# Patient Record
Sex: Female | Born: 1955 | Race: Black or African American | Hispanic: No | Marital: Single | Smoking: Former smoker
Health system: Southern US, Community
[De-identification: ages and names within clinical notes are randomized; demographics above are authoritative.]

## PROBLEM LIST (undated history)

## (undated) DIAGNOSIS — I890 Lymphedema, not elsewhere classified: Secondary | ICD-10-CM

## (undated) DIAGNOSIS — I10 Essential (primary) hypertension: Secondary | ICD-10-CM

## (undated) DIAGNOSIS — I2699 Other pulmonary embolism without acute cor pulmonale: Secondary | ICD-10-CM

## (undated) DIAGNOSIS — R269 Unspecified abnormalities of gait and mobility: Secondary | ICD-10-CM

## (undated) DIAGNOSIS — D649 Anemia, unspecified: Secondary | ICD-10-CM

## (undated) DIAGNOSIS — E119 Type 2 diabetes mellitus without complications: Secondary | ICD-10-CM

## (undated) DIAGNOSIS — G35 Multiple sclerosis: Secondary | ICD-10-CM

## (undated) HISTORY — DX: Unspecified abnormalities of gait and mobility: R26.9

---

## 1999-07-17 ENCOUNTER — Emergency Department (HOSPITAL_COMMUNITY): Admission: EM | Admit: 1999-07-17 | Discharge: 1999-07-18 | Payer: Self-pay | Admitting: Emergency Medicine

## 1999-08-15 ENCOUNTER — Ambulatory Visit (HOSPITAL_COMMUNITY): Admission: RE | Admit: 1999-08-15 | Discharge: 1999-08-15 | Payer: Self-pay | Admitting: Psychiatry

## 2001-04-12 ENCOUNTER — Other Ambulatory Visit: Admission: RE | Admit: 2001-04-12 | Discharge: 2001-04-12 | Payer: Self-pay | Admitting: Obstetrics and Gynecology

## 2003-05-29 ENCOUNTER — Encounter: Admission: RE | Admit: 2003-05-29 | Discharge: 2003-05-29 | Payer: Self-pay | Admitting: Obstetrics and Gynecology

## 2004-06-27 ENCOUNTER — Emergency Department (HOSPITAL_COMMUNITY): Admission: EM | Admit: 2004-06-27 | Discharge: 2004-06-27 | Payer: Self-pay | Admitting: Family Medicine

## 2005-08-09 ENCOUNTER — Ambulatory Visit (HOSPITAL_COMMUNITY): Admission: RE | Admit: 2005-08-09 | Discharge: 2005-08-09 | Payer: Self-pay | Admitting: Cardiovascular Disease

## 2007-03-25 ENCOUNTER — Ambulatory Visit (HOSPITAL_COMMUNITY): Admission: RE | Admit: 2007-03-25 | Discharge: 2007-03-25 | Payer: Self-pay | Admitting: Cardiovascular Disease

## 2007-11-04 ENCOUNTER — Emergency Department (HOSPITAL_COMMUNITY): Admission: EM | Admit: 2007-11-04 | Discharge: 2007-11-05 | Payer: Self-pay | Admitting: Emergency Medicine

## 2010-04-28 ENCOUNTER — Encounter: Admission: RE | Admit: 2010-04-28 | Discharge: 2010-06-08 | Payer: Self-pay | Source: Home / Self Care

## 2011-03-15 LAB — COMPREHENSIVE METABOLIC PANEL
ALT: 19
AST: 21
Albumin: 3.9
Alkaline Phosphatase: 60
BUN: 11
CO2: 28
Calcium: 9.6
Chloride: 100
Creatinine, Ser: 0.57
GFR calc Af Amer: >60
GFR calc non Af Amer: >60
Glucose, Bld: 186 — ABNORMAL HIGH
Potassium: 2.9 — ABNORMAL LOW
Sodium: 139
Total Bilirubin: 0.5
Total Protein: 7.4

## 2011-03-15 LAB — URINALYSIS, ROUTINE W REFLEX MICROSCOPIC
Bilirubin Urine: NEGATIVE
Glucose, UA: NEGATIVE
Hgb urine dipstick: NEGATIVE
Ketones, ur: NEGATIVE
Nitrite: NEGATIVE
Protein, ur: NEGATIVE
Specific Gravity, Urine: 1.019
Urobilinogen, UA: 0.2
pH: 7

## 2011-03-15 LAB — DIFFERENTIAL
Basophils Absolute: 0.1
Basophils Relative: 1
Eosinophils Absolute: 0.1
Eosinophils Relative: 2
Lymphocytes Relative: 43
Lymphs Abs: 3
Monocytes Absolute: 0.5
Monocytes Relative: 8
Neutro Abs: 3.3
Neutrophils Relative %: 47

## 2011-03-15 LAB — CBC
HCT: 34.5 — ABNORMAL LOW
Hemoglobin: 11.5 — ABNORMAL LOW
MCHC: 33.2
MCV: 86.5
Platelets: 430 — ABNORMAL HIGH
RBC: 3.99
RDW: 12.3
WBC: 7.1

## 2011-03-15 LAB — URINE MICROSCOPIC-ADD ON

## 2011-05-10 ENCOUNTER — Encounter: Payer: Self-pay | Admitting: *Deleted

## 2011-05-10 ENCOUNTER — Other Ambulatory Visit: Payer: Self-pay

## 2011-05-10 ENCOUNTER — Emergency Department (HOSPITAL_COMMUNITY): Payer: Medicare Other

## 2011-05-10 ENCOUNTER — Inpatient Hospital Stay (HOSPITAL_COMMUNITY)
Admission: EM | Admit: 2011-05-10 | Discharge: 2011-05-13 | DRG: 871 | Disposition: A | Discharge status: Home Health | Payer: Medicare Other | Source: Ambulatory Visit | Attending: Internal Medicine | Admitting: Internal Medicine

## 2011-05-10 DIAGNOSIS — G35 Multiple sclerosis: Secondary | ICD-10-CM | POA: Diagnosis present

## 2011-05-10 DIAGNOSIS — R6510 Systemic inflammatory response syndrome (SIRS) of non-infectious origin without acute organ dysfunction: Principal | ICD-10-CM | POA: Diagnosis present

## 2011-05-10 DIAGNOSIS — Z79899 Other long term (current) drug therapy: Secondary | ICD-10-CM

## 2011-05-10 DIAGNOSIS — R739 Hyperglycemia, unspecified: Secondary | ICD-10-CM

## 2011-05-10 DIAGNOSIS — E876 Hypokalemia: Secondary | ICD-10-CM | POA: Diagnosis present

## 2011-05-10 DIAGNOSIS — R000 Tachycardia, unspecified: Secondary | ICD-10-CM

## 2011-05-10 DIAGNOSIS — R252 Cramp and spasm: Secondary | ICD-10-CM

## 2011-05-10 DIAGNOSIS — I890 Lymphedema, not elsewhere classified: Secondary | ICD-10-CM | POA: Diagnosis present

## 2011-05-10 DIAGNOSIS — Z882 Allergy status to sulfonamides status: Secondary | ICD-10-CM

## 2011-05-10 DIAGNOSIS — E119 Type 2 diabetes mellitus without complications: Secondary | ICD-10-CM | POA: Diagnosis present

## 2011-05-10 DIAGNOSIS — R509 Fever, unspecified: Secondary | ICD-10-CM

## 2011-05-10 DIAGNOSIS — L8990 Pressure ulcer of unspecified site, unspecified stage: Secondary | ICD-10-CM | POA: Diagnosis present

## 2011-05-10 DIAGNOSIS — J189 Pneumonia, unspecified organism: Secondary | ICD-10-CM | POA: Diagnosis present

## 2011-05-10 DIAGNOSIS — L8992 Pressure ulcer of unspecified site, stage 2: Secondary | ICD-10-CM | POA: Diagnosis present

## 2011-05-10 DIAGNOSIS — L89109 Pressure ulcer of unspecified part of back, unspecified stage: Secondary | ICD-10-CM | POA: Diagnosis present

## 2011-05-10 DIAGNOSIS — Z87891 Personal history of nicotine dependence: Secondary | ICD-10-CM

## 2011-05-10 DIAGNOSIS — I10 Essential (primary) hypertension: Secondary | ICD-10-CM | POA: Diagnosis present

## 2011-05-10 HISTORY — DX: Type 2 diabetes mellitus without complications: E11.9

## 2011-05-10 HISTORY — DX: Lymphedema, not elsewhere classified: I89.0

## 2011-05-10 HISTORY — DX: Essential (primary) hypertension: I10

## 2011-05-10 HISTORY — DX: Multiple sclerosis: G35

## 2011-05-10 LAB — CBC
HCT: 30.9 % — ABNORMAL LOW (ref 36.0–46.0)
Hemoglobin: 10.4 g/dL — ABNORMAL LOW (ref 12.0–15.0)
MCH: 27.9 pg (ref 26.0–34.0)
MCHC: 33.7 g/dL (ref 30.0–36.0)
MCV: 82.8 fL (ref 78.0–100.0)
Platelets: 401 10*3/uL — ABNORMAL HIGH (ref 150–400)
RBC: 3.73 MIL/uL — ABNORMAL LOW (ref 3.87–5.11)
RDW: 12.7 % (ref 11.5–15.5)
WBC: 17.5 10*3/uL — ABNORMAL HIGH (ref 4.0–10.5)

## 2011-05-10 LAB — COMPREHENSIVE METABOLIC PANEL
ALT: 13 U/L (ref 0–35)
AST: 15 U/L (ref 0–37)
Albumin: 3.6 g/dL (ref 3.5–5.2)
Alkaline Phosphatase: 82 U/L (ref 39–117)
BUN: 12 mg/dL (ref 6–23)
CO2: 25 mEq/L (ref 19–32)
Calcium: 9.7 mg/dL (ref 8.4–10.5)
Chloride: 93 mEq/L — ABNORMAL LOW (ref 96–112)
Creatinine, Ser: 0.45 mg/dL — ABNORMAL LOW (ref 0.50–1.10)
GFR calc Af Amer: >90 mL/min (ref >90)
GFR calc non Af Amer: >90 mL/min (ref >90)
Glucose, Bld: 352 mg/dL — ABNORMAL HIGH (ref 70–99)
Potassium: 2.9 mEq/L — ABNORMAL LOW (ref 3.5–5.1)
Sodium: 132 mEq/L — ABNORMAL LOW (ref 135–145)
Total Bilirubin: 0.3 mg/dL (ref 0.3–1.2)
Total Protein: 7.9 g/dL (ref 6.0–8.3)

## 2011-05-10 LAB — DIFFERENTIAL
Basophils Absolute: 0 10*3/uL (ref 0.0–0.1)
Basophils Relative: 0 % (ref 0–1)
Eosinophils Absolute: 0 10*3/uL (ref 0.0–0.7)
Eosinophils Relative: 0 % (ref 0–5)
Lymphocytes Relative: 7 % — ABNORMAL LOW (ref 12–46)
Lymphs Abs: 1.3 10*3/uL (ref 0.7–4.0)
Monocytes Absolute: 1.4 10*3/uL — ABNORMAL HIGH (ref 0.1–1.0)
Monocytes Relative: 8 % (ref 3–12)
Neutro Abs: 14.7 10*3/uL — ABNORMAL HIGH (ref 1.7–7.7)
Neutrophils Relative %: 84 % — ABNORMAL HIGH (ref 43–77)

## 2011-05-10 MED ORDER — LORAZEPAM 1 MG PO TABS
1.0000 mg | ORAL_TABLET | Freq: Once | ORAL | Status: AC
  Administered 2011-05-10: 1 mg via ORAL
  Filled 2011-05-10: qty 1

## 2011-05-10 NOTE — ED Notes (Signed)
the patient states she has been having muscle spasms in her hands. the patient states both her hands will "go numb". the patient has good cap refill, strong bilateral radial pulses, confirms tactile pain.  the patient states she took her baclofen with relief. the patient also presents with bilateral leg swelling with +3 pitting edema. Pt states she is a type II diabetic.

## 2011-05-10 NOTE — ED Notes (Signed)
Per EMS: pt c/o of muscle spasms. Pt was given advil at 1900. Pt has a hx of MS. Pt thinks this is a MS flare up.

## 2011-05-10 NOTE — ED Notes (Signed)
[id number]<JY> Expected date:<BR> Expected time:<BR> Means of arrival:<BR> Comments:<BR> EMS/weak-hx MS

## 2011-05-11 ENCOUNTER — Encounter (HOSPITAL_COMMUNITY): Payer: Self-pay | Admitting: Family Medicine

## 2011-05-11 DIAGNOSIS — E119 Type 2 diabetes mellitus without complications: Secondary | ICD-10-CM | POA: Diagnosis present

## 2011-05-11 DIAGNOSIS — I890 Lymphedema, not elsewhere classified: Secondary | ICD-10-CM | POA: Diagnosis present

## 2011-05-11 DIAGNOSIS — I10 Essential (primary) hypertension: Secondary | ICD-10-CM | POA: Diagnosis present

## 2011-05-11 DIAGNOSIS — J189 Pneumonia, unspecified organism: Secondary | ICD-10-CM | POA: Diagnosis present

## 2011-05-11 DIAGNOSIS — G35 Multiple sclerosis: Secondary | ICD-10-CM | POA: Diagnosis present

## 2011-05-11 DIAGNOSIS — E876 Hypokalemia: Secondary | ICD-10-CM | POA: Diagnosis present

## 2011-05-11 DIAGNOSIS — R6510 Systemic inflammatory response syndrome (SIRS) of non-infectious origin without acute organ dysfunction: Secondary | ICD-10-CM | POA: Diagnosis present

## 2011-05-11 LAB — CBC
MCHC: 33.8 g/dL (ref 30.0–36.0)
MCV: 82.2 fL (ref 78.0–100.0)
RBC: 3.38 MIL/uL — ABNORMAL LOW (ref 3.87–5.11)
WBC: 19.5 10*3/uL — ABNORMAL HIGH (ref 4.0–10.5)

## 2011-05-11 LAB — BASIC METABOLIC PANEL
BUN: 10 mg/dL (ref 6–23)
Chloride: 97 mEq/L (ref 96–112)
Creatinine, Ser: 0.36 mg/dL — ABNORMAL LOW (ref 0.50–1.10)
GFR calc non Af Amer: >90 mL/min (ref >90)
Glucose, Bld: 324 mg/dL — ABNORMAL HIGH (ref 70–99)
Potassium: 3 mEq/L — ABNORMAL LOW (ref 3.5–5.1)

## 2011-05-11 LAB — URINE MICROSCOPIC-ADD ON

## 2011-05-11 LAB — URINALYSIS, ROUTINE W REFLEX MICROSCOPIC
Bilirubin Urine: NEGATIVE
Glucose, UA: >1000 mg/dL — AB
Hgb urine dipstick: NEGATIVE
Ketones, ur: 15 mg/dL — AB
Leukocytes, UA: NEGATIVE
Nitrite: NEGATIVE
Protein, ur: 100 mg/dL — AB
Specific Gravity, Urine: 1.039 — ABNORMAL HIGH (ref 1.005–1.030)
Urobilinogen, UA: 1 mg/dL (ref 0.0–1.0)
pH: 6.5 (ref 5.0–8.0)

## 2011-05-11 LAB — HEMOGLOBIN A1C: Mean Plasma Glucose: 249 mg/dL — ABNORMAL HIGH (ref <117)

## 2011-05-11 LAB — GLUCOSE, CAPILLARY
Glucose-Capillary: 221 mg/dL — ABNORMAL HIGH (ref 70–99)
Glucose-Capillary: 276 mg/dL — ABNORMAL HIGH (ref 70–99)

## 2011-05-11 LAB — MAGNESIUM: Magnesium: 1.6 mg/dL (ref 1.5–2.5)

## 2011-05-11 LAB — PREGNANCY, URINE: Preg Test, Ur: NEGATIVE

## 2011-05-11 MED ORDER — ENOXAPARIN SODIUM 40 MG/0.4ML ~~LOC~~ SOLN
40.0000 mg | SUBCUTANEOUS | Status: DC
  Administered 2011-05-11 – 2011-05-13 (×3): 40 mg via SUBCUTANEOUS
  Filled 2011-05-11 (×4): qty 0.4

## 2011-05-11 MED ORDER — IBUPROFEN 600 MG PO TABS
600.0000 mg | ORAL_TABLET | Freq: Four times a day (QID) | ORAL | Status: DC | PRN
  Administered 2011-05-11: 14:00:00 via ORAL
  Administered 2011-05-12 (×2): 600 mg via ORAL
  Filled 2011-05-11 (×4): qty 1

## 2011-05-11 MED ORDER — SODIUM CHLORIDE 0.9 % IJ SOLN: 3.0000 mL | INTRAMUSCULAR | Status: DC | PRN

## 2011-05-11 MED ORDER — INSULIN GLARGINE 100 UNIT/ML ~~LOC~~ SOLN
15.0000 [IU] | Freq: Every day | SUBCUTANEOUS | Status: DC
  Administered 2011-05-11: 15 [IU] via SUBCUTANEOUS
  Filled 2011-05-11: qty 3

## 2011-05-11 MED ORDER — BACLOFEN 10 MG PO TABS
10.0000 mg | ORAL_TABLET | Freq: Three times a day (TID) | ORAL | Status: DC
  Administered 2011-05-11 – 2011-05-13 (×7): 10 mg via ORAL
  Filled 2011-05-11 (×9): qty 1

## 2011-05-11 MED ORDER — HYDROCODONE-ACETAMINOPHEN 5-325 MG PO TABS
1.0000 | ORAL_TABLET | ORAL | Status: DC | PRN
  Administered 2011-05-13 (×2): 2 via ORAL
  Filled 2011-05-11: qty 1
  Filled 2011-05-11 (×2): qty 2

## 2011-05-11 MED ORDER — INSULIN ASPART 100 UNIT/ML ~~LOC~~ SOLN
0.0000 [IU] | Freq: Three times a day (TID) | SUBCUTANEOUS | Status: DC
  Administered 2011-05-11: 11 [IU] via SUBCUTANEOUS
  Administered 2011-05-11: 5 [IU] via SUBCUTANEOUS
  Administered 2011-05-11: 8 [IU] via SUBCUTANEOUS
  Administered 2011-05-12: 11 [IU] via SUBCUTANEOUS
  Administered 2011-05-12: 8 [IU] via SUBCUTANEOUS
  Administered 2011-05-12: 5 [IU] via SUBCUTANEOUS
  Administered 2011-05-13 (×2): 3 [IU] via SUBCUTANEOUS
  Filled 2011-05-11: qty 3

## 2011-05-11 MED ORDER — INSULIN ASPART 100 UNIT/ML ~~LOC~~ SOLN
0.0000 [IU] | Freq: Every day | SUBCUTANEOUS | Status: DC
  Administered 2011-05-11: 3 [IU] via SUBCUTANEOUS
  Administered 2011-05-12: 5 [IU] via SUBCUTANEOUS

## 2011-05-11 MED ORDER — LEVOFLOXACIN IN D5W 500 MG/100ML IV SOLN
500.0000 mg | INTRAVENOUS | Status: DC
  Administered 2011-05-11 – 2011-05-13 (×3): 500 mg via INTRAVENOUS
  Filled 2011-05-11 (×5): qty 100

## 2011-05-11 MED ORDER — POTASSIUM CHLORIDE 10 MEQ/100ML IV SOLN
10.0000 meq | Freq: Once | INTRAVENOUS | Status: AC
  Administered 2011-05-11: 10 meq via INTRAVENOUS
  Filled 2011-05-11: qty 100

## 2011-05-11 MED ORDER — POTASSIUM CHLORIDE CRYS ER 20 MEQ PO TBCR
40.0000 meq | EXTENDED_RELEASE_TABLET | ORAL | Status: AC
  Administered 2011-05-11 (×3): 40 meq via ORAL
  Filled 2011-05-11 (×3): qty 2

## 2011-05-11 MED ORDER — GLIPIZIDE 5 MG PO TABS
5.0000 mg | ORAL_TABLET | Freq: Two times a day (BID) | ORAL | Status: DC
  Administered 2011-05-11 – 2011-05-13 (×5): 5 mg via ORAL
  Filled 2011-05-11 (×8): qty 1

## 2011-05-11 MED ORDER — ALUM & MAG HYDROXIDE-SIMETH 200-200-20 MG/5ML PO SUSP: 30.0000 mL | Freq: Four times a day (QID) | ORAL | Status: DC | PRN

## 2011-05-11 MED ORDER — POTASSIUM CHLORIDE CRYS ER 20 MEQ PO TBCR
40.0000 meq | EXTENDED_RELEASE_TABLET | Freq: Once | ORAL | Status: AC
  Administered 2011-05-11: 40 meq via ORAL
  Filled 2011-05-11: qty 2

## 2011-05-11 NOTE — ED Provider Notes (Cosign Needed)
History     CSN: [id number] Arrival date & time: 05/10/2011  8:45 PM   First MD Initiated Contact with Patient 05/10/11 2046      Chief Complaint  Patient presents with  . Spasms    (Consider location/radiation/quality/duration/timing/severity/associated sxs/prior treatment) HPI  Pt presnts to the emergency department with complaints of spasms in her hands and lymphedema in bilateral lower extremities. It has been going on for 1 day, it is severe, and is not associated with any other symptoms.. Pt has a past medical history of diabetes, lymphadema, and hypertension. She is feverish, tachycardia. Pt denies chest pain, shortness of breath, weakness, dizziness, abdominal pain, urinary symptoms, or decreased eating and drinking,.  Past Medical History  Diagnosis Date  . MS (multiple sclerosis)   . HTN (hypertension)   . DM II (diabetes mellitus, type II), controlled     History reviewed. No pertinent past surgical history.  History reviewed. No pertinent family history.  History  Substance Use Topics  . 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 Smokeless tobacco: Not on file  . Alcohol Use: No    OB History    Grav Para Term Preterm Abortions TAB SAB Ect Mult Living                  Review of Systems  Allergies  Sulfa antibiotics  Home Medications   Current Outpatient Rx  Name Route Sig Dispense Refill  . BACLOFEN 10 MG PO TABS Oral Take 10 mg by mouth 3 (three) times daily.      Marland Kitchen GLIPIZIDE 5 MG PO TABS Oral Take 5 mg by mouth 2 (two) times daily before a meal.      . IBUPROFEN 200 MG PO TABS Oral Take 600 mg by mouth every 6 (six) hours as needed.      . INTERFERON BETA-1A 30 MCG/0.5ML IM KIT Intramuscular Inject 30 mcg into the muscle every 7 (seven) days.        BP 111/53  Pulse 115  Temp(Src) 101.4 F (38.6 C) (Rectal)  Resp 23  Ht 5\' 3"  (1.6 m)  Wt 180 lb (81.647 kg)  BMI 31.89 kg/m2  SpO2 100%  Physical Exam  Constitutional: She appears well-developed  and well-nourished.  HENT:  Head: Normocephalic and atraumatic.  Eyes: Conjunctivae are normal. Pupils are equal, round, and reactive to light.  Neck: Trachea normal, normal range of motion and full passive range of motion without pain. Neck supple.  Cardiovascular: Regular rhythm and normal pulses.  Tachycardia present.   Pulmonary/Chest: Effort normal and breath sounds normal. Chest wall is not dull to percussion. She exhibits no tenderness, no crepitus, no edema, no deformity and no retraction.  Abdominal: Soft. Normal appearance and bowel sounds are normal.  Musculoskeletal: Normal range of motion. She exhibits edema (severe bilateral lower extremity venus stasis and lymphedema).       Legs: Lymphadenopathy:       Head (right side): No submental, no submandibular, no tonsillar, no preauricular, no posterior auricular and no occipital adenopathy present.       Head (left side): No submental, no submandibular, no tonsillar, no preauricular, no posterior auricular and no occipital adenopathy present.    She has no cervical adenopathy.    She has no axillary adenopathy.  Neurological: She is alert. She has normal strength.  Skin: Skin is warm, dry and intact. There is pallor.  Psychiatric: She has a normal mood and affect. Her speech is normal and behavior is normal. Judgment  and thought content normal. Cognition and memory are normal.    ED Course  Procedures (including critical care time)  Labs Reviewed  COMPREHENSIVE METABOLIC PANEL - Abnormal; Notable for the following:    Sodium 132 (*)    Potassium 2.9 (*)    Chloride 93 (*)    Glucose, Bld 352 (*)    Creatinine, Ser 0.45 (*)    All other components within normal limits  CBC - Abnormal; Notable for the following:    WBC 17.5 (*)    RBC 3.73 (*)    Hemoglobin 10.4 (*)    HCT 30.9 (*)    Platelets 401 (*)    All other components within normal limits  DIFFERENTIAL - Abnormal; Notable for the following:    Neutrophils  Relative 84 (*)    Neutro Abs 14.7 (*)    Lymphocytes Relative 7 (*)    Monocytes Absolute 1.4 (*)    All other components within normal limits  PREGNANCY, URINE  URINALYSIS, ROUTINE W REFLEX MICROSCOPIC  MAGNESIUM   Dg Chest 2 View  05/10/2011  *RADIOLOGY REPORT*  Clinical Data: Lower extremity edema  CHEST - 2 VIEW  Comparison: None.  Findings: Mild left lung base opacity.  Heart size upper normal limits to mildly enlarged.  Mild central vascular congestion.  No overt edema pattern.  No acute osseous abnormality.  IMPRESSION: Heart size upper normal limits to mildly enlarged.  Central vascular congestion without overt edema.  Very mild left lung base opacity is nonspecific.  Atelectasis, scarring, or infiltrate.  Original Report Authenticated By: Waneta Martins, M.D.     1. Hypokalemia   2. Lymphedema   3. Hyperglycemia   4. Tachycardia   5. Febrile   6. Spasms of the hands or feet       MDM  Pt [facility] to [facility]/surg to Dr. Brion Aliment   Date: 05/11/2011  Rate:116  Rhythm: sinus tachycardia  QRS Axis: normal  Intervals: normal  ST/T Wave abnormalities: normal  Conduction Disutrbances:none  Narrative Interpretation:   Old EKG Reviewed: none available       Dorthula Matas, PA 05/11/11 0116  Dorthula Matas, PA 05/11/11 [phone]

## 2011-05-11 NOTE — Progress Notes (Signed)
ANTIBIOTIC CONSULT NOTE - INITIAL  Pharmacy Consult for Levqauin Indication: urosepsis  Allergies  Allergen Reactions  . Sulfa Antibiotics Shortness Of Breath    Patient Measurements: Height: 5\' 3"  (160 cm) Weight: 183 lb 14.4 oz (83.416 kg) (bedscale/pt unable to stand) IBW/kg (Calculated) : 52.4   Vital Signs: Temp: 99.7 F (37.6 C) (11/22 0256) Temp src: Oral (11/22 0256) BP: 135/84 mmHg (11/22 0256) Pulse Rate: 117  (11/22 0256)  Labs:  [facility] 05/10/11 2140  WBC 17.5*  HGB 10.4*  PLT 401*  LABCREA --  CREATININE 0.45*   Estimated Creatinine Clearance: 80.3 ml/min (by C-G formula based on Cr of 0.45).   Medical History: Past Medical History  Diagnosis Date  . MS (multiple sclerosis)   . HTN (hypertension)   . DM II (diabetes mellitus, type II), controlled   . Lymphedema     Medications:  Scheduled:    . baclofen  10 mg Oral TID  . enoxaparin  40 mg Subcutaneous Q24H  . glipiZIDE  5 mg Oral BID AC  . insulin aspart  0-15 Units Subcutaneous TID WC  . insulin aspart  0-5 Units Subcutaneous QHS  . levofloxacin (LEVAQUIN) IV  500 mg Intravenous Q24H  . LORazepam  1 mg Oral Once  . potassium chloride  10 mEq Intravenous Once  . potassium chloride  40 mEq Oral Once   Assessment: Pt admitted with probably urosepsis.  Normal renal function.   Plan:  1) start levaquin 500mg  IV q24h. 2) f/u culture data  3) consider change to po as appropriate 4) pharmacy will sign off - please reconsult as needed  Lidya Mccalister, [facility] 05/11/2011,3:11 AM

## 2011-05-11 NOTE — H&P (Signed)
PCP:   Daryl Eastern  Chief Complaint:  Muscle spasm  HPI: This is a 55 year old female with known history of multiple sclerosis, today at home she developed severe cramps in her hands. She was unable to close her fingers, she came to the ER. The patient states her legs are much heavier, she less able to move them. She reports no fevers or chills, no nausea, no vomiting, no abdominal pain, no diarrhea, no cough or altered mental status, no shortness of breath or wheezing. She describes the cramping is severe. And she she states she was having increasing weakness. Here the ER the patient was found to be hypokalemic and febrile. The hospitalist service was called with a request for admission. History obtained from the patient's.  Review of Systems: Positives bolded The patient denies anorexia, fever, weight loss,, vision loss, decreased hearing, hoarseness, chest pain, syncope, dyspnea on exertion, peripheral edema, balance deficits, hemoptysis, abdominal pain, melena, hematochezia, severe indigestion/heartburn, hematuria, incontinence, genital sores, muscle weakness, suspicious skin lesions, transient blindness, difficulty walking, depression, unusual weight change, abnormal bleeding, enlarged lymph nodes, angioedema, and breast masses.  Past Medical History: Past Medical History  Diagnosis Date  . MS (multiple sclerosis)   . HTN (hypertension)   . DM II (diabetes mellitus, type II), controlled   . Lymphedema    History reviewed. No pertinent past surgical history.  Medications: Prior to Admission medications   Medication Sig Start Date End Date Taking? Authorizing Provider  baclofen (LIORESAL) 10 MG tablet Take 10 mg by mouth 3 (three) times daily.     Yes Historical Provider, MD  glipiZIDE (GLUCOTROL) 5 MG tablet Take 5 mg by mouth 2 (two) times daily before a meal.     Yes Historical Provider, MD  ibuprofen (ADVIL,MOTRIN) 200 MG tablet Take 600 mg by mouth every 6 (six) hours as needed.      Yes Historical Provider, MD  interferon beta-1a (AVONEX) 30 MCG/0.5ML injection Inject 30 mcg into the muscle every 7 (seven) days.     Yes Historical Provider, MD    Allergies:   Allergies  Allergen Reactions  . Sulfa Antibiotics Shortness Of Breath    Social History:  reports that she has quit smoking. She does not have any smokeless tobacco history on file. She reports that she does not drink alcohol or use illicit drugs. patient visit family, she is bed bound  Family History: Family History  Problem Relation Age of Onset  . Multiple sclerosis Mother     Physical Exam: Filed Vitals:   05/11/11 0030 05/11/11 0045 05/11/11 0100 05/11/11 0115  BP: 138/81 143/78 148/67 130/69  Pulse:    113  Temp:      TempSrc:      Resp: 0 11 17 14   Height:      Weight:      SpO2:    98%    General:  Alert and oriented times three, well developed and nourished, no acute distress Eyes: PERRLA, pink conjunctiva, no scleral icterus ENT: Moist oral mucosa, neck supple, no thyromegaly Lungs: clear to ascultation, no wheeze, no crackles, no use of accessory muscles Cardiovascular: regular rate and rhythm, no regurgitation, no gallops, no murmurs. No carotid bruits, no JVD Abdomen: soft, positive BS, non-tender, non-distended, no organomegaly, not an acute abdomen GU: not examined Neuro: CN II - XII grossly intact, sensation intact Musculoskeletal: strength 0/5 bilateral lower extremities, no clubbing, bilateral lower extremity edema 3+ chronic Skin: no rash, no subcutaneous crepitation, positive sacral decubitus 3,  all her stage II Psych: appropriate patient   Labs on Admission:   [facility] 05/10/11 2140  NA 132*  K 2.9*  CL 93*  CO2 25  GLUCOSE 352*  BUN 12  CREATININE 0.45*  CALCIUM 9.7  MG 1.6  PHOS --    Basename 05/10/11 2140  AST 15  ALT 13  ALKPHOS 82  BILITOT 0.3  PROT 7.9  ALBUMIN 3.6   No results found for this basename: LIPASE:2,AMYLASE:2 in the last 72  hours  Basename 05/10/11 2140  WBC 17.5*  NEUTROABS 14.7*  HGB 10.4*  HCT 30.9*  MCV 82.8  PLT 401*   No results found for this basename: CKTOTAL:3,CKMB:3,CKMBINDEX:3,TROPONINI:3 in the last 72 hours No results found for this basename: TSH,T4TOTAL,FREET3,T3FREE,THYROIDAB in the last 72 hours No results found for this basename: VITAMINB12:2,FOLATE:2,FERRITIN:2,TIBC:2,IRON:2,RETICCTPCT:2 in the last 72 hours  Results for Jocelyn, Sanchez (MRN [id number]) as of 05/11/2011 01:57  Ref. Range 05/11/2011 00:20  Color, Urine Latest Range: YELLOW  YELLOW  Appearance Latest Range: CLEAR  CLEAR  Specific Gravity, Urine Latest Range: 1.005-1.030  1.039 (H)  pH Latest Range: 5.0-8.0  6.5  Glucose, UA Latest Range: NEGATIVE mg/dL >4098 (A)  Bilirubin Urine Latest Range: NEGATIVE  NEGATIVE  Ketones, ur Latest Range: NEGATIVE mg/dL 15 (A)  Protein Latest Range: NEGATIVE mg/dL 119 (A)  Urobilinogen, UA Latest Range: 0.0-1.0 mg/dL 1.0  Nitrite Latest Range: NEGATIVE  NEGATIVE  Leukocytes, UA Latest Range: NEGATIVE  NEGATIVE  Bacteria, UA Latest Range: RARE  RARE  Preg Test, Ur No range found NEGATIVE   Radiological Exams on Admission: Dg Chest 2 View  05/10/2011  *RADIOLOGY REPORT*  Clinical Data: Lower extremity edema  CHEST - 2 VIEW  Comparison: None.  Findings: Mild left lung base opacity.  Heart size upper normal limits to mildly enlarged.  Mild central vascular congestion.  No overt edema pattern.  No acute osseous abnormality.  IMPRESSION: Heart size upper normal limits to mildly enlarged.  Central vascular congestion without overt edema.  Very mild left lung base opacity is nonspecific.  Atelectasis, scarring, or infiltrate.  Original Report Authenticated By: Waneta Martins, M.D.     Assessment/Plan Present on Admission:  .Hypokalemia Admit to telemetry Add magnesium level Replete potassium Low potassium may be responsible for patient's muscle spasms  .SIRS (systemic inflammatory  response syndrome) Possible urinary source Await culture Antibiotics started Diabetes mellitus Multiple sclerosis Hypertension Stable resume home medications ADA diet, sliding scale insulin Sacral decubitus POA Wound care consult   CODE STATUS: DO NOT RESUSCITATE DVT prophylaxis Team 5/Dr. Raquel Sarna, Hawley Michel 05/11/2011, 1:56 AM

## 2011-05-11 NOTE — Progress Notes (Signed)
Subjective: Low grade fever overnight, denies shortness of breath or cough, overall feeling better. Patient denies muscle spasm today.  Objective: Vital signs in last 24 hours: Temp:  [99.7 F (37.6 C)-101.4 F (38.6 C)] 99.7 F (37.6 C) (11/22 0256) Pulse Rate:  [84-126] 117  (11/22 1043) Resp:  [0-23] 18  (11/22 1043) BP: (111-148)/(52-94) 121/76 mmHg (11/22 1043) SpO2:  [98 %-100 %] 100 % (11/22 1043) Weight:  [81.647 kg (180 lb)-83.416 kg (183 lb 14.4 oz)] 183 lb 14.4 oz (83.416 kg) (11/22 0256) Weight change:  Last BM Date: 05/09/11  Intake/Output from previous day:   Total I/O In: 360 [P.O.:360] Out: -    Physical Exam: General: Alert, awake, oriented x3, in no acute distress. HEENT: No bruits, no goiter. Heart: Mild tachycardia, regula rythm, no murmurs, rubs, gallops. Lungs: Clear to auscultation bilaterally. Abdomen: Soft, nontender, nondistended, positive bowel sounds. Extremities: Bilateral lower strandy 3+ edema, chronic stasis dermatitis changes. Neuro: No new focal deficit.   Lab Results: Basic Metabolic Panel:  Basename 05/11/11 0630 05/10/11 2140  NA 135 132*  K 3.0* 2.9*  CL 97 93*  CO2 25 25  GLUCOSE 324* 352*  BUN 10 12  CREATININE 0.36* 0.45*  CALCIUM 9.1 9.7  MG -- 1.6  PHOS -- --   Liver Function Tests:  [facility] 05/10/11 2140  AST 15  ALT 13  ALKPHOS 82  BILITOT 0.3  PROT 7.9  ALBUMIN 3.6   CBC:  Basename 05/11/11 0630 05/10/11 2140  WBC 19.5* 17.5*  NEUTROABS -- 14.7*  HGB 9.4* 10.4*  HCT 27.8* 30.9*  MCV 82.2 82.8  PLT 362 401*   CBG:  Basename 05/11/11 1109 05/11/11 0602  GLUCAP 295* 307*    Studies/Results: Dg Chest 2 View  05/10/2011  *RADIOLOGY REPORT*  Clinical Data: Lower extremity edema  CHEST - 2 VIEW  Comparison: None.  Findings: Mild left lung base opacity.  Heart size upper normal limits to mildly enlarged.  Mild central vascular congestion.  No overt edema pattern.  No acute osseous abnormality.   IMPRESSION: Heart size upper normal limits to mildly enlarged.  Central vascular congestion without overt edema.  Very mild left lung base opacity is nonspecific.  Atelectasis, scarring, or infiltrate.  Original Report Authenticated By: Waneta Martins, M.D.    Medications: Scheduled Meds:   . baclofen  10 mg Oral TID  . enoxaparin  40 mg Subcutaneous Q24H  . glipiZIDE  5 mg Oral BID AC  . insulin aspart  0-15 Units Subcutaneous TID WC  . insulin aspart  0-5 Units Subcutaneous QHS  . levofloxacin (LEVAQUIN) IV  500 mg Intravenous Q24H  . LORazepam  1 mg Oral Once  . potassium chloride  10 mEq Intravenous Once  . potassium chloride  40 mEq Oral Once  . potassium chloride  40 mEq Oral Q4H   Continuous Infusions:  PRN Meds:.alum & mag hydroxide-simeth, HYDROcodone-acetaminophen, ibuprofen, sodium chloride  Assessment/Plan: 1-SIRS (systemic inflammatory response syndrome) 2/2 PNA: Patient still with low grade temperature overnight, otherwise no having any cough, shortness of breath, or chest pain. At this point will continue Levaquin IV day 2/8. Plan is for transition and maybe discharge home tomorrow if continue to be stable. 2-PNA (pneumonia): As above continue antibiotics. 3-Hypokalemia: will replete potassium. 4-Multiple sclerosis: stable continue supportive care, baclofen and as an outpatient avonex. 5-Diabetes mellitus: Will check hemoglobin A1c; continue sliding scale insulin, glipizide and Lantus. 6-Lymphedema: chronic; continue leg elevation and eucerin cream to prevent over-dryness. 7-DVT: Continue  Lovenox.    LOS: 1 day   Meagon Duskin 05/11/2011, 1:04 PM

## 2011-05-12 DIAGNOSIS — L8990 Pressure ulcer of unspecified site, unspecified stage: Secondary | ICD-10-CM | POA: Diagnosis present

## 2011-05-12 LAB — GLUCOSE, CAPILLARY
Glucose-Capillary: 268 mg/dL — ABNORMAL HIGH (ref 70–99)
Glucose-Capillary: 236 mg/dL — ABNORMAL HIGH (ref 70–99)

## 2011-05-12 MED ORDER — INSULIN GLARGINE 100 UNIT/ML ~~LOC~~ SOLN
25.0000 [IU] | Freq: Every day | SUBCUTANEOUS | Status: DC
  Administered 2011-05-12: 25 [IU] via SUBCUTANEOUS

## 2011-05-12 MED ORDER — POTASSIUM CHLORIDE CRYS ER 20 MEQ PO TBCR
40.0000 meq | EXTENDED_RELEASE_TABLET | Freq: Once | ORAL | Status: AC
  Administered 2011-05-12: 40 meq via ORAL
  Filled 2011-05-12: qty 2

## 2011-05-12 NOTE — Progress Notes (Signed)
Utilization Review Completed.  Jocelyn Sanchez  05/12/2011 

## 2011-05-12 NOTE — Plan of Care (Signed)
Problem: Phase I Progression Outcomes Goal: EF % per last Echo/documented,Core Reminder form on chart Outcome: Not Applicable Date Met:  05/12/11 No result found

## 2011-05-12 NOTE — Progress Notes (Signed)
Spoke w pt, went over list of hhc agencies and has heard of [facility] and would like them. Pt's prim dr Algie Coffer is working to get pt personal care service. Pt would also like to go on caps waiting list. Caps closed today so will make ref on mon 11/26 for pt. Ref to [employer] for rn and pt. Copy of hhc agency list in chart. Lives w mother but does own adl's. She goes from bed to scooter. She has female friend that comes to transfer her to chair and back to bed.  HOME HEALTH AGENCIES SERVING [place]   Agencies that are Medicare-Certified and are affiliated with [facility]  Telephone Number Address  [facility]   The [facility] has ownership interest in this company; however, you are under no obligation to use this agency. [phone] or  [phone] [address] [place], [place] [address]   Agencies that are Medicare-Certified and are not affiliated with [facility] Agency Telephone Number Address  [facility] [phone] Fax [phone] [address] [place], [place]  [address]  [facility] [phone] or [phone] Fax [phone] [address] [place], [place] [address]  [facility] [phone] Fax [phone] [address] [place], [place] [address]  [facility] [phone] Fax [phone] [address] [place], [place]  [address]  [facility] [phone] Fax [phone] [address], Suite [place], [place] [address]  [facility] [phone] [address] [place], [place] [address]  [facility] [phone]  [address] [place], [place] [address]  [facility] [phone] or  [phone] Fax [phone] [address] [place], [place]  [address]  [facility] [phone] Fax [phone] [address] [place], [place]  [address]  [facility]  [phone] Fax [phone] [address] [place], [place] [address]

## 2011-05-12 NOTE — Progress Notes (Signed)
Subjective: Spike temp yesterday late afternoon, denies shortness of breath or cough, overall feeling better. No muscle spasm.  Objective: Vital signs in last 24 hours: Temp:  [98.6 F (37 C)-101.7 F (38.7 C)] 98.6 F (37 C) (11/23 0720) Pulse Rate:  [110-121] 112  (11/23 0720) Resp:  [16-20] 16  (11/23 0720) BP: (108-135)/(67-78) 108/67 mmHg (11/23 0720) SpO2:  [96 %-99 %] 97 % (11/23 0720) Weight:  [82.419 kg (181 lb 11.2 oz)] 181 lb 11.2 oz (82.419 kg) (11/23 0406) Weight change: 0.771 kg (1 lb 11.2 oz) Last BM Date: 05/09/11  Intake/Output from previous day: 11/22 0701 - 11/23 0700 In: 800 [P.O.:600; IV Piggyback:200] Out: -  Total I/O In: 240 [P.O.:240] Out: -    Physical Exam: General: Alert, awake, oriented x3, in no acute distress. HEENT: No bruits, no goiter. Heart: Mild tachycardia, regula rythm, no murmurs, rubs, gallops. Lungs: Clear to auscultation bilaterally. Abdomen: Soft, nontender, nondistended, positive bowel sounds. Extremities: Bilateral lower strandy 3+ edema, chronic stasis dermatitis changes. Neuro: No new focal deficit.   Lab Results: Basic Metabolic Panel:  Basename 05/11/11 0630 05/10/11 2140  NA 135 132*  K 3.0* 2.9*  CL 97 93*  CO2 25 25  GLUCOSE 324* 352*  BUN 10 12  CREATININE 0.36* 0.45*  CALCIUM 9.1 9.7  MG -- 1.6  PHOS -- --   Liver Function Tests:  [facility] 05/10/11 2140  AST 15  ALT 13  ALKPHOS 82  BILITOT 0.3  PROT 7.9  ALBUMIN 3.6   CBC:  Basename 05/11/11 0630 05/10/11 2140  WBC 19.5* 17.5*  NEUTROABS -- 14.7*  HGB 9.4* 10.4*  HCT 27.8* 30.9*  MCV 82.2 82.8  PLT 362 401*   CBG:  Basename 05/12/11 1110 05/12/11 0614 05/11/11 2207 05/11/11 1621 05/11/11 1109 05/11/11 0602  GLUCAP 305* 268* 276* 221* 295* 307*    Studies/Results: Dg Chest 2 View  05/10/2011  *RADIOLOGY REPORT*  Clinical Data: Lower extremity edema  CHEST - 2 VIEW  Comparison: None.  Findings: Mild left lung base opacity.  Heart size  upper normal limits to mildly enlarged.  Mild central vascular congestion.  No overt edema pattern.  No acute osseous abnormality.  IMPRESSION: Heart size upper normal limits to mildly enlarged.  Central vascular congestion without overt edema.  Very mild left lung base opacity is nonspecific.  Atelectasis, scarring, or infiltrate.  Original Report Authenticated By: Waneta Martins, M.D.    Medications: Scheduled Meds:    . baclofen  10 mg Oral TID  . enoxaparin  40 mg Subcutaneous Q24H  . glipiZIDE  5 mg Oral BID AC  . insulin aspart  0-15 Units Subcutaneous TID WC  . insulin aspart  0-5 Units Subcutaneous QHS  . insulin glargine  15 Units Subcutaneous QHS  . levofloxacin (LEVAQUIN) IV  500 mg Intravenous Q24H  . potassium chloride  40 mEq Oral Q4H  . potassium chloride  40 mEq Oral Once   Continuous Infusions:  PRN Meds:.alum & mag hydroxide-simeth, HYDROcodone-acetaminophen, ibuprofen, sodium chloride  Assessment/Plan: 1--CAP: Slowly improving; still febrile yesterday. Continue antibiotics today through her vein and most likely home tomorrow.Day 3/8. 2--Hypokalemia: will replete and check BMET in am. 3--Multiple sclerosis: stable continue supportive care, baclofen and as an outpatient avonex. 4--Diabetes mellitus: hemoglobin A1c 10.3; continue sliding scale insulin, glipizide and will increase lantus. 5--Lymphedema: chronic; continue leg elevation and eucerin cream. 6--DVT: Continue Lovenox. 7--Decubitus ulcer: Continue prevention measure, increase protein intake and continue wound care.  LOS: 2 days   Jocelyn Sanchez 05/12/2011, 11:39 AM

## 2011-05-12 NOTE — Plan of Care (Signed)
Problem: Consults Goal: Diabetes Guidelines if Diabetic/Glucose > 140 If diabetic or lab glucose is > 140 mg/dl - Initiate Diabetes/Hyperglycemia Guidelines & Document Interventions  Outcome: Progressing Diabetic Coordinator consulted

## 2011-05-12 NOTE — Consults (Signed)
WOC consult Note  Requested to eval. Pt for wound care for pressure ulcer.   Reason for Consult: Pt reports pressure ulcer to sacrum and new open area to left hip.  She has had these areas for some time but not sure the actual length of time present.  Wound type:Pressure ulcers x 2.  Stage III PU sacrum,  Stage II PU left trochanter   Pressure Ulcer POA: Yes  Measurement: Sacrum 2.5 cm x 3.0 cm x 0.2 cm , L trocanter 0.5cm x 0.5 cm x 0.2cm  Wound bed: both pale and pink  Drainage (amount, consistency, odor) minimal yellow of sacrum, moderate brown yellow with odor to left hip  Periwound: intact without problems   Dressing procedure/placement/frequency: would recommend silicone foam dressing to both wounds, would suggest addition of silver absorbant under left hip dressing to address drainage.  Would change dressings every other day.  As well discussed increase protein for wound healing and pressure redistribution for wound healing.  Will order chair pad for pt to use while sitting up out of bed.  Pt reports area of concerns were worsened by transfers at home, would benefit from PT eval for home safety with transfers.   Re consult if needed, will not follow at this time. Thanks  Caedyn Tassinari [employer], CWOCN [phone]) Conservative sharp wound debridement (CSWD performed at the bedside:

## 2011-05-12 NOTE — Plan of Care (Signed)
Problem: Phase I Progression Outcomes Goal: Up in chair, BRP Outcome: Not Applicable Date Met:  05/12/11 Pt wheeled chair/bed bound at home

## 2011-05-13 LAB — BASIC METABOLIC PANEL
BUN: 9 mg/dL (ref 6–23)
Calcium: 8.8 mg/dL (ref 8.4–10.5)
GFR calc non Af Amer: >90 mL/min (ref >90)
Glucose, Bld: 190 mg/dL — ABNORMAL HIGH (ref 70–99)
Sodium: 138 mEq/L (ref 135–145)

## 2011-05-13 LAB — CBC
Hemoglobin: 9.1 g/dL — ABNORMAL LOW (ref 12.0–15.0)
MCH: 27.5 pg (ref 26.0–34.0)
MCHC: 33.2 g/dL (ref 30.0–36.0)
RDW: 13.1 % (ref 11.5–15.5)

## 2011-05-13 MED ORDER — LEVOFLOXACIN 500 MG PO TABS: 500.0000 mg | ORAL_TABLET | Freq: Every day | ORAL | Status: AC

## 2011-05-13 MED ORDER — METFORMIN HCL 1000 MG PO TABS: 1000.0000 mg | ORAL_TABLET | Freq: Two times a day (BID) | ORAL | Status: DC

## 2011-05-13 MED ORDER — HYDROCODONE-ACETAMINOPHEN 5-325 MG PO TABS: 1.0000 | ORAL_TABLET | Freq: Three times a day (TID) | ORAL | Status: AC | PRN

## 2011-05-13 NOTE — Discharge Summary (Signed)
Physician Discharge Summary  Patient ID: Jocelyn Sanchez MRN: [id number] DOB/AGE: [birth year] 55 y.o.  Admit date: 05/10/2011 Discharge date: 05/13/2011  Primary Care Physician:   Dr. Orpah Cobb  Discharge Diagnoses:   .Hypokalemia .SIRS (systemic inflammatory response syndrome) .Multiple sclerosis .Diabetes mellitus .Hypertension .Lymphedema .PNA (pneumonia) .Decubitus ulcers  Current Discharge Medication List    START taking these medications   Details  HYDROcodone-acetaminophen (NORCO) 5-325 MG per tablet Take 1-2 tablets by mouth every 8 (eight) hours as needed for pain. Qty: 35 tablet, Refills: 0    levofloxacin (LEVAQUIN) 500 MG tablet Take 1 tablet (500 mg total) by mouth daily. Qty: 5 tablet, Refills: 0    metFORMIN (GLUCOPHAGE) 1000 MG tablet Take 1 tablet (1,000 mg total) by mouth 2 (two) times daily with a meal. Qty: 60 tablet, Refills: 1      CONTINUE these medications which have NOT CHANGED   Details  baclofen (LIORESAL) 10 MG tablet Take 10 mg by mouth 3 (three) times daily.      glipiZIDE (GLUCOTROL) 5 MG tablet Take 5 mg by mouth 2 (two) times daily before a meal.      ibuprofen (ADVIL,MOTRIN) 200 MG tablet Take 600 mg by mouth every 6 (six) hours as needed.      interferon beta-1a (AVONEX) 30 MCG/0.5ML injection Inject 30 mcg into the muscle every 7 (seven) days.           Disposition and Follow-up:  Patient has been discharged in a stable and improved condition, she is going back home arrangements has been made to provide physical therapy and also HHRN at home. Patient is currently no having any fever, no chest pain, no shortness of breath. She has been instructed to follow a low carbohydrate diets and has been started on metformin 1000 mg by mouth twice a day. She had been instructed to arrange followup with PCP Dr. Algie Coffer for further evaluation and treatment of her chronic condition especially her diabetes as an outpatient. Patient hemoglobin  A1c for this admission was 10.3 mean that if she failed to respond to the combination of glipizide and metformin she is going to require insulin therapy. Patient will acquire a glucose meter and is planning to follow better care of her diabetes. She has been instructed to finish the antibiotics as prescribed for complete antibiotic therapy for her pneumonia.   Consults:  None  Significant Diagnostic Studies:  Dg Chest 2 View  05/10/2011  *RADIOLOGY REPORT*  Clinical Data: Lower extremity edema  CHEST - 2 VIEW  Comparison: None.  Findings: Mild left lung base opacity.  Heart size upper normal limits to mildly enlarged.  Mild central vascular congestion.  No overt edema pattern.  No acute osseous abnormality.  IMPRESSION: Heart size upper normal limits to mildly enlarged.  Central vascular congestion without overt edema.  Very mild left lung base opacity is nonspecific.  Atelectasis, scarring, or infiltrate.  Original Report Authenticated By: Waneta Martins, M.D.    Brief H and P: For complete details please refer to admission H and P, but in brief 55 year old female with a past medical history significant for multiple sclerosis, hypertension, diabetes and bilateral lymphedema, who came into the hospital secondary to generalized weakness and severe muscle cramps. In the major department she was found to be febrile with a temperature up to 102 and also hypokalemic with a potassium less than 2.5; patient denies any chest pain, nausea/vomiting, cough or any sick contacts. In the ED chest x-ray demonstrated left  lung infiltrates (consistent with pneumonia); at that moment [facility] hospitalist has been called to admit the patient for further evaluation and treatment.  Hospital Course:  1-SIRS (systemic inflammatory response syndrome) Due to CAP: Patient initially treated with IV antibiotics, onece she has been no febrile for 24 hours, her antibiotics were changed to PO and patient discharged home to  complete antibiotic regimen as an outpatient. She will followed by Dr. Algie Coffer, primary care physician in order to assure complete resolution of her symptoms and provide further treatment of her chronic conditions.  2-Hypokalemia: Most likely secondary to poor intake, at this point back to normal range after repletion. Magnesium within normal range.  3-Multiple sclerosis: Patient will continue supportive care, we are going to arrange a home health PT and also home health [facility] as an outpatient and she will continue Avonex as previously prescribed.  4-Diabetes mellitus: On control, with a hemoglobin A1c of 10.3; at this point we have started the patient on metformin 1000 mg by mouth twice a day and she will continue using her glipizide. She will follow with primary care physician for further adjustment of her medications and to determine best time to start insulin. At this point her levels definitely qualify for insulin therapy, but the patient doesn't have a glucometer at home and is no train to inject herself; so is something unsafe starting insulin therapy at this moment. Will led primary care physician to follow on this problem and make the appropriate adjustments.  5-Hypertension: Well controlled without medications. She will continue a low-sodium diet and is a diabetic will benefit of Robles ACE inhibitor. Primary care physician to start this medication if appropriate.  6-Lymphedema: Patient instructed to follow a low-sodium diet, and to keep her legs elevated as much as possible for better flow and to decreased swelling. Her skin was very dry and instructions to apply eucerin or any other moisturizer cream twice a day was given.  7-Decubitus ulcers: Instructions were given to the patient for wound care in order to help prevent in an with the healing process of her decubitus ulcers. Arrangement has been made for home health nurse to follow on  this patient. Per wound care recommendations silicone  foam dressing to both wounds and silver absorbant under left hip dressing to address drainage. Change dressings every other day.    Time spent on Discharge: 45 minutes  Signed: Armari Fussell 05/13/2011, 2:59 PM

## 2011-05-15 LAB — GLUCOSE, CAPILLARY

## 2011-05-17 LAB — CULTURE, BLOOD (ROUTINE X 2)
Culture  Setup Time: 201211221001
Culture: NO GROWTH

## 2011-12-17 ENCOUNTER — Encounter (HOSPITAL_COMMUNITY): Payer: Self-pay | Admitting: Emergency Medicine

## 2011-12-17 ENCOUNTER — Inpatient Hospital Stay (HOSPITAL_COMMUNITY)
Admission: EM | Admit: 2011-12-17 | Discharge: 2011-12-23 | DRG: 603 | Disposition: A | Discharge status: Home Health | Payer: PRIVATE HEALTH INSURANCE | Attending: Cardiovascular Disease | Admitting: Cardiovascular Disease

## 2011-12-17 DIAGNOSIS — G35 Multiple sclerosis: Secondary | ICD-10-CM | POA: Diagnosis present

## 2011-12-17 DIAGNOSIS — D72829 Elevated white blood cell count, unspecified: Secondary | ICD-10-CM | POA: Diagnosis present

## 2011-12-17 DIAGNOSIS — L03115 Cellulitis of right lower limb: Secondary | ICD-10-CM

## 2011-12-17 DIAGNOSIS — L89109 Pressure ulcer of unspecified part of back, unspecified stage: Secondary | ICD-10-CM | POA: Diagnosis present

## 2011-12-17 DIAGNOSIS — L02419 Cutaneous abscess of limb, unspecified: Principal | ICD-10-CM | POA: Diagnosis present

## 2011-12-17 DIAGNOSIS — E119 Type 2 diabetes mellitus without complications: Secondary | ICD-10-CM | POA: Diagnosis present

## 2011-12-17 DIAGNOSIS — I10 Essential (primary) hypertension: Secondary | ICD-10-CM | POA: Diagnosis present

## 2011-12-17 DIAGNOSIS — L8992 Pressure ulcer of unspecified site, stage 2: Secondary | ICD-10-CM | POA: Diagnosis present

## 2011-12-17 DIAGNOSIS — E876 Hypokalemia: Secondary | ICD-10-CM | POA: Diagnosis present

## 2011-12-17 DIAGNOSIS — I890 Lymphedema, not elsewhere classified: Secondary | ICD-10-CM | POA: Diagnosis present

## 2011-12-17 DIAGNOSIS — R6510 Systemic inflammatory response syndrome (SIRS) of non-infectious origin without acute organ dysfunction: Secondary | ICD-10-CM

## 2011-12-17 DIAGNOSIS — Z79899 Other long term (current) drug therapy: Secondary | ICD-10-CM

## 2011-12-17 DIAGNOSIS — L03119 Cellulitis of unspecified part of limb: Secondary | ICD-10-CM

## 2011-12-17 DIAGNOSIS — Z6831 Body mass index (BMI) 31.0-31.9, adult: Secondary | ICD-10-CM

## 2011-12-17 DIAGNOSIS — Z87891 Personal history of nicotine dependence: Secondary | ICD-10-CM

## 2011-12-17 LAB — CBC WITH DIFFERENTIAL/PLATELET
Basophils Absolute: 0 10*3/uL (ref 0.0–0.1)
HCT: 32.1 % — ABNORMAL LOW (ref 36.0–46.0)
Hemoglobin: 10.8 g/dL — ABNORMAL LOW (ref 12.0–15.0)
Lymphocytes Relative: 5 % — ABNORMAL LOW (ref 12–46)
Monocytes Relative: 1 % — ABNORMAL LOW (ref 3–12)
Neutro Abs: 12 10*3/uL — ABNORMAL HIGH (ref 1.7–7.7)
RBC: 3.89 MIL/uL (ref 3.87–5.11)
RDW: 13.8 % (ref 11.5–15.5)
WBC: 12.7 10*3/uL — ABNORMAL HIGH (ref 4.0–10.5)

## 2011-12-17 LAB — COMPREHENSIVE METABOLIC PANEL
BUN: 13 mg/dL (ref 6–23)
CO2: 21 mEq/L (ref 19–32)
Calcium: 8.4 mg/dL (ref 8.4–10.5)
Creatinine, Ser: 0.45 mg/dL — ABNORMAL LOW (ref 0.50–1.10)
GFR calc Af Amer: >90 mL/min (ref >90)
GFR calc non Af Amer: >90 mL/min (ref >90)
Glucose, Bld: 140 mg/dL — ABNORMAL HIGH (ref 70–99)
Sodium: 132 mEq/L — ABNORMAL LOW (ref 135–145)
Total Protein: 6.9 g/dL (ref 6.0–8.3)

## 2011-12-17 LAB — DIFFERENTIAL
Basophils Absolute: 0 10*3/uL (ref 0.0–0.1)
Eosinophils Relative: 0 % (ref 0–5)
Lymphocytes Relative: 5 % — ABNORMAL LOW (ref 12–46)
Monocytes Relative: 3 % (ref 3–12)
Neutrophils Relative %: 92 % — ABNORMAL HIGH (ref 43–77)
WBC Morphology: INCREASED

## 2011-12-17 LAB — CBC
HCT: 27.6 % — ABNORMAL LOW (ref 36.0–46.0)
Hemoglobin: 9.1 g/dL — ABNORMAL LOW (ref 12.0–15.0)
MCH: 27.2 pg (ref 26.0–34.0)
MCHC: 33 g/dL (ref 30.0–36.0)
MCV: 82.6 fL (ref 78.0–100.0)
RBC: 3.34 MIL/uL — ABNORMAL LOW (ref 3.87–5.11)
WBC: 14.2 10*3/uL — ABNORMAL HIGH (ref 4.0–10.5)

## 2011-12-17 LAB — GLUCOSE, CAPILLARY
Glucose-Capillary: 145 mg/dL — ABNORMAL HIGH (ref 70–99)
Glucose-Capillary: 146 mg/dL — ABNORMAL HIGH (ref 70–99)

## 2011-12-17 LAB — URINE MICROSCOPIC-ADD ON

## 2011-12-17 LAB — BASIC METABOLIC PANEL
BUN: 17 mg/dL (ref 6–23)
Chloride: 93 mEq/L — ABNORMAL LOW (ref 96–112)
GFR calc Af Amer: >90 mL/min (ref >90)
Potassium: 3 mEq/L — ABNORMAL LOW (ref 3.5–5.1)

## 2011-12-17 LAB — LACTIC ACID, PLASMA: Lactic Acid, Venous: 2 mmol/L (ref 0.5–2.2)

## 2011-12-17 LAB — URINALYSIS, ROUTINE W REFLEX MICROSCOPIC
Leukocytes, UA: NEGATIVE
Nitrite: NEGATIVE
Specific Gravity, Urine: 1.024 (ref 1.005–1.030)
pH: 6 (ref 5.0–8.0)

## 2011-12-17 MED ORDER — ENOXAPARIN SODIUM 40 MG/0.4ML ~~LOC~~ SOLN
40.0000 mg | SUBCUTANEOUS | Status: DC
  Administered 2011-12-17 – 2011-12-22 (×6): 40 mg via SUBCUTANEOUS
  Filled 2011-12-17 (×7): qty 0.4

## 2011-12-17 MED ORDER — METOPROLOL TARTRATE 1 MG/ML IV SOLN
2.5000 mg | Freq: Once | INTRAVENOUS | Status: AC
  Administered 2011-12-17: 2.5 mg via INTRAVENOUS
  Filled 2011-12-17: qty 5

## 2011-12-17 MED ORDER — SODIUM CHLORIDE 0.9 % IV BOLUS (SEPSIS)
1000.0000 mL | Freq: Once | INTRAVENOUS | Status: AC
  Administered 2011-12-17: 1000 mL via INTRAVENOUS

## 2011-12-17 MED ORDER — HYDROCODONE-ACETAMINOPHEN 5-325 MG PO TABS
1.0000 | ORAL_TABLET | Freq: Once | ORAL | Status: AC
  Administered 2011-12-17: 1 via ORAL
  Filled 2011-12-17: qty 1

## 2011-12-17 MED ORDER — DOCUSATE SODIUM 100 MG PO CAPS
100.0000 mg | ORAL_CAPSULE | Freq: Two times a day (BID) | ORAL | Status: DC
  Administered 2011-12-17 – 2011-12-23 (×12): 100 mg via ORAL
  Filled 2011-12-17 (×13): qty 1

## 2011-12-17 MED ORDER — POLYETHYLENE GLYCOL 3350 17 G PO PACK
17.0000 g | PACK | Freq: Every day | ORAL | Status: DC
  Administered 2011-12-17 – 2011-12-23 (×6): 17 g via ORAL
  Filled 2011-12-17 (×7): qty 1

## 2011-12-17 MED ORDER — INSULIN ASPART 100 UNIT/ML ~~LOC~~ SOLN
0.0000 [IU] | Freq: Three times a day (TID) | SUBCUTANEOUS | Status: DC
  Administered 2011-12-20 (×3): 2 [IU] via SUBCUTANEOUS
  Administered 2011-12-21: 14:00:00 via SUBCUTANEOUS
  Administered 2011-12-21 – 2011-12-22 (×2): 2 [IU] via SUBCUTANEOUS
  Administered 2011-12-22 (×2): 1 [IU] via SUBCUTANEOUS
  Administered 2011-12-23: 2 [IU] via SUBCUTANEOUS

## 2011-12-17 MED ORDER — METOPROLOL TARTRATE 25 MG PO TABS
25.0000 mg | ORAL_TABLET | Freq: Two times a day (BID) | ORAL | Status: DC
  Administered 2011-12-17 – 2011-12-23 (×12): 25 mg via ORAL
  Filled 2011-12-17 (×13): qty 1

## 2011-12-17 MED ORDER — CEFAZOLIN SODIUM 1-5 GM-% IV SOLN
1.0000 g | Freq: Once | INTRAVENOUS | Status: AC
  Administered 2011-12-17: 1 g via INTRAVENOUS
  Filled 2011-12-17: qty 50

## 2011-12-17 MED ORDER — ALUM & MAG HYDROXIDE-SIMETH 200-200-20 MG/5ML PO SUSP
30.0000 mL | Freq: Four times a day (QID) | ORAL | Status: DC | PRN
  Administered 2011-12-22: 30 mL via ORAL
  Filled 2011-12-17: qty 30

## 2011-12-17 MED ORDER — ASPIRIN EC 81 MG PO TBEC
81.0000 mg | DELAYED_RELEASE_TABLET | Freq: Every day | ORAL | Status: DC
  Administered 2011-12-17 – 2011-12-23 (×7): 81 mg via ORAL
  Filled 2011-12-17 (×7): qty 1

## 2011-12-17 MED ORDER — CEFAZOLIN SODIUM 1-5 GM-% IV SOLN
1.0000 g | Freq: Three times a day (TID) | INTRAVENOUS | Status: DC
  Administered 2011-12-17 – 2011-12-23 (×16): 1 g via INTRAVENOUS
  Filled 2011-12-17 (×20): qty 50

## 2011-12-17 MED ORDER — DOCUSATE SODIUM 100 MG PO CAPS: 100.0000 mg | ORAL_CAPSULE | Freq: Every day | ORAL | Status: DC

## 2011-12-17 MED ORDER — HYDROCODONE-ACETAMINOPHEN 5-325 MG PO TABS
1.0000 | ORAL_TABLET | Freq: Four times a day (QID) | ORAL | Status: DC | PRN
  Administered 2011-12-17 – 2011-12-21 (×12): 1 via ORAL
  Administered 2011-12-21 – 2011-12-22 (×4): 2 via ORAL
  Administered 2011-12-22: 1 via ORAL
  Administered 2011-12-23 (×2): 2 via ORAL
  Filled 2011-12-17 (×2): qty 1
  Filled 2011-12-17: qty 2
  Filled 2011-12-17 (×2): qty 1
  Filled 2011-12-17: qty 2
  Filled 2011-12-17 (×2): qty 1
  Filled 2011-12-17: qty 2
  Filled 2011-12-17 (×2): qty 1
  Filled 2011-12-17: qty 2
  Filled 2011-12-17 (×2): qty 1
  Filled 2011-12-17 (×4): qty 2
  Filled 2011-12-17 (×3): qty 1

## 2011-12-17 MED ORDER — GLIPIZIDE 5 MG PO TABS
5.0000 mg | ORAL_TABLET | Freq: Two times a day (BID) | ORAL | Status: DC
  Administered 2011-12-17: 5 mg via ORAL
  Filled 2011-12-17 (×4): qty 1

## 2011-12-17 MED ORDER — POTASSIUM CHLORIDE CRYS ER 20 MEQ PO TBCR
40.0000 meq | EXTENDED_RELEASE_TABLET | Freq: Once | ORAL | Status: AC
  Administered 2011-12-17: 40 meq via ORAL
  Filled 2011-12-17: qty 2

## 2011-12-17 MED ORDER — POTASSIUM CHLORIDE IN NACL 20-0.9 MEQ/L-% IV SOLN
INTRAVENOUS | Status: DC
  Administered 2011-12-17 – 2011-12-18 (×2): via INTRAVENOUS
  Filled 2011-12-17 (×4): qty 1000

## 2011-12-17 NOTE — ED Notes (Signed)
ZOX:[id number]<AV> Expected date:12/17/11<BR> Expected time: 7:46 AM<BR> Means of arrival:Ambulance<BR> Comments:<BR> MS, leg weakness

## 2011-12-17 NOTE — ED Notes (Signed)
CBG 145 @ 0840

## 2011-12-17 NOTE — H&P (Signed)
Jocelyn Sanchez is an 56 y.o. female.   Chief Complaint: Generalized weakness poor appetite HPI: Patient is 56 year old female with past medical history significant for multiple sclerosis, hypertension, diabetes mellitus, chronic lymphedema, morbid obesity, chronic decubitus ulcer, came to the ER because of generalized weakness poor appetite and could not get out of bed. Patient was noted to have high-grade fever of 102 and was noted to have bilateral  erythema and swelling of lower extremities suggestive of cellulitis. Patient denies any chest pain nausea vomiting diaphoresis. Denies any shortness of breath denies any palpitation lightheadedness or syncope. Patient denies any fever or chills.  Past Medical History  Diagnosis Date  . MS (multiple sclerosis)   . HTN (hypertension)   . DM II (diabetes mellitus, type II), controlled   . Lymphedema     History reviewed. No pertinent past surgical history.  Family History  Problem Relation Age of Onset  . Multiple sclerosis Mother    Social History:  reports that she has quit smoking. She does not have any smokeless tobacco history on file. She reports that she does not drink alcohol or use illicit drugs.  Allergies:  Allergies  Allergen Reactions  . Sulfa Antibiotics Shortness Of Breath     (Not in a hospital admission)  Results for orders placed during the hospital encounter of 12/17/11 (from the past 48 hour(s))  GLUCOSE, CAPILLARY     Status: Abnormal   Collection Time   12/17/11  8:41 AM      Component Value Range Comment   Glucose-Capillary 145 (*) 70 - 99 mg/dL   CBC WITH DIFFERENTIAL     Status: Abnormal   Collection Time   12/17/11  9:07 AM      Component Value Range Comment   WBC 12.7 (*) 4.0 - 10.5 K/uL    RBC 3.89  3.87 - 5.11 MIL/uL    Hemoglobin 10.8 (*) 12.0 - 15.0 g/dL    HCT 16.1 (*) 09.6 - 46.0 %    MCV 82.5  78.0 - 100.0 fL    MCH 27.8  26.0 - 34.0 pg    MCHC 33.6  30.0 - 36.0 g/dL    RDW 04.5  40.9 - 81.1 %     Platelets 398  150 - 400 K/uL    Neutrophils Relative 94 (*) 43 - 77 %    Lymphocytes Relative 5 (*) 12 - 46 %    Monocytes Relative 1 (*) 3 - 12 %    Eosinophils Relative 0  0 - 5 %    Basophils Relative 0  0 - 1 %    Neutro Abs 12.0 (*) 1.7 - 7.7 K/uL    Lymphs Abs 0.6 (*) 0.7 - 4.0 K/uL    Monocytes Absolute 0.1  0.1 - 1.0 K/uL    Eosinophils Absolute 0.0  0.0 - 0.7 K/uL    Basophils Absolute 0.0  0.0 - 0.1 K/uL    WBC Morphology INCREASED BANDS (>20% BANDS)     BASIC METABOLIC PANEL     Status: Abnormal   Collection Time   12/17/11  9:07 AM      Component Value Range Comment   Sodium 131 (*) 135 - 145 mEq/L    Potassium 3.0 (*) 3.5 - 5.1 mEq/L    Chloride 93 (*) 96 - 112 mEq/L    CO2 23  19 - 32 mEq/L    Glucose, Bld 202 (*) 70 - 99 mg/dL    BUN 17  6 -  23 mg/dL    Creatinine, Ser 1.61  0.50 - 1.10 mg/dL    Calcium 9.4  8.4 - 09.6 mg/dL    GFR calc non Af Amer >90  >90 mL/min    GFR calc Af Amer >90  >90 mL/min   LACTIC ACID, PLASMA     Status: Normal   Collection Time   12/17/11  9:07 AM      Component Value Range Comment   Lactic Acid, Venous 2.0  0.5 - 2.2 mmol/L   URINALYSIS, ROUTINE W REFLEX MICROSCOPIC     Status: Abnormal   Collection Time   12/17/11  9:45 AM      Component Value Range Comment   Color, Urine YELLOW  YELLOW    APPearance CLEAR  CLEAR    Specific Gravity, Urine 1.024  1.005 - 1.030    pH 6.0  5.0 - 8.0    Glucose, UA NEGATIVE  NEGATIVE mg/dL    Hgb urine dipstick TRACE (*) NEGATIVE    Bilirubin Urine NEGATIVE  NEGATIVE    Ketones, ur NEGATIVE  NEGATIVE mg/dL    Protein, ur NEGATIVE  NEGATIVE mg/dL    Urobilinogen, UA 0.2  0.0 - 1.0 mg/dL    Nitrite NEGATIVE  NEGATIVE    Leukocytes, UA NEGATIVE  NEGATIVE   URINE MICROSCOPIC-ADD ON     Status: Normal   Collection Time   12/17/11  9:45 AM      Component Value Range Comment   WBC, UA 0-2  <3 WBC/hpf    RBC / HPF 3-6  <3 RBC/hpf    Urine-Other MUCOUS PRESENT      No results found.  Review  of Systems  Constitutional: Positive for malaise/fatigue. Negative for fever and chills.  Respiratory: Negative for cough, hemoptysis, sputum production and shortness of breath.   Cardiovascular: Positive for leg swelling. Negative for chest pain, palpitations and orthopnea.  Gastrointestinal: Negative for heartburn, nausea, vomiting and abdominal pain.  Neurological: Negative for dizziness.    Blood pressure 134/70, pulse 119, temperature 99 F (37.2 C), temperature source Oral, resp. rate 20, SpO2 93.00%. Physical Exam  Constitutional: She is oriented to person, place, and time. She appears well-developed and well-nourished.  Eyes: Conjunctivae are normal. Pupils are equal, round, and reactive to light. Left eye exhibits no discharge. No scleral icterus.  Neck: Normal range of motion. Neck supple. No JVD present. No tracheal deviation present. No thyromegaly present.  Cardiovascular: Regular rhythm and normal heart sounds.  Exam reveals no friction rub.   No murmur heard.      Tachycardic  Respiratory: Effort normal and breath sounds normal. No respiratory distress. She has no wheezes. She has no rales.  GI: Soft. Bowel sounds are normal. She exhibits no distension. There is no tenderness.  Musculoskeletal:       No clubbing cyanosis 4+ edema with nonhealing ulcers left leg and erythema bilaterally and sacral decubiti  Lymphadenopathy:    She has no cervical adenopathy.  Neurological: She is alert and oriented to person, place, and time.     Assessment/Plan Cellulitis lower extremities Hypertension Diabetes mellitus Chronic lymphedema Multiple sclerosis Chronic decubitus ulcers Morbid obesity Plan As per orders  Aoi Kouns N 12/17/2011, 12:27 PM

## 2011-12-17 NOTE — ED Provider Notes (Signed)
Complains of generalized weakness subjective fever for approximately one week patient wheelchair-bound history of multiple sclerosis. On exam alert nontoxic noted to be febrile lungs clear auscultation heart regular in rhythm tachycardic abdomen obese nontender bilateral extremities with 4+ pretibial edema right lower extremity red and warm in circumferential fashion from the knee distally to the foot. Back with silver dollar sized presacral decubitus ulcer, superficial. Likely source of fever is cellulitis from  right lower extremity   Doug Sou, MD 12/17/11 [phone]

## 2011-12-17 NOTE — ED Notes (Signed)
Pt called EMS for worsening lower extremity weakness onset this AM when she was unable to transfer from chair to bedside commode. Pt reports worsening swelling to lower extremities.

## 2011-12-17 NOTE — ED Notes (Signed)
Pt states she thinks she is dehydrated because she is thirsty and had difficulty moving lower extremities this morning. Pt states she also thinks she is constipated and reports last BM last week sometime. Pt has 3+ pitting, weeping edema to BLE, states swelling is worse today.

## 2011-12-17 NOTE — Progress Notes (Signed)
Patient heart rate coninue to be elevated in the 130s, notified Dr Sharyn Lull, order given, reported off the night shift-RN of order and to F/U with plan of care.

## 2011-12-17 NOTE — ED Provider Notes (Signed)
History     CSN: [id number]  Arrival date & time 12/17/11  0814   First MD Initiated Contact with Patient 12/17/11 0827      Chief Complaint  Patient presents with  . Extremity Weakness    (Consider location/radiation/quality/duration/timing/severity/associated sxs/prior treatment) HPI Comments: Patient reports generalized weakness that began today.  Pt also states she is feeling dehydrated, constipated x 1 week. Chronic back pain that feels like pressure.  Last BM was 1 week ago - pt is passing flatus, is eating and drinking normally, denies N/V, abdominal pain.  Denies chills, chest pain, SOB, cough.    The history is provided by the patient and medical records.    Past Medical History  Diagnosis Date  . MS (multiple sclerosis)   . HTN (hypertension)   . DM II (diabetes mellitus, type II), controlled   . Lymphedema     History reviewed. No pertinent past surgical history.  Family History  Problem Relation Age of Onset  . Multiple sclerosis Mother     History  Substance Use Topics  . 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 Smokeless tobacco: Not on file  . Alcohol Use: No    OB History    Grav Para Term Preterm Abortions TAB SAB Ect Mult Living                  Review of Systems  Constitutional: Negative for chills.  Respiratory: Negative for cough and shortness of breath.   Cardiovascular: Positive for leg swelling. Negative for chest pain.  Gastrointestinal: Positive for constipation. Negative for nausea, vomiting, abdominal pain and diarrhea.  Musculoskeletal: Positive for back pain.       Back pain is chronic, unchanged.  Neurological: Positive for weakness and numbness.       Numbness in bilateral feet, unchanged    Allergies  Sulfa antibiotics  Home Medications   Current Outpatient Rx  Name Route Sig Dispense Refill  . BACLOFEN 10 MG PO TABS Oral Take 10 mg by mouth 3 (three) times daily.      Marland Kitchen GLIPIZIDE 5 MG PO TABS Oral Take 5 mg by mouth 2 (two)  times daily before a meal.      . IBUPROFEN 200 MG PO TABS Oral Take 600 mg by mouth every 6 (six) hours as needed.      . INTERFERON BETA-1A 30 MCG/0.5ML IM KIT Intramuscular Inject 30 mcg into the muscle every 7 (seven) days.      Marland Kitchen METFORMIN HCL 1000 MG PO TABS Oral Take 1 tablet (1,000 mg total) by mouth 2 (two) times daily with a meal. 60 tablet 1    BP 122/67  Pulse 93  Temp 102.3 F (39.1 C) (Rectal)  Resp 16  SpO2 93%  Physical Exam  Nursing note and vitals reviewed. Constitutional: She is oriented to person, place, and time. She appears well-developed and well-nourished. No distress.  HENT:  Head: Normocephalic and atraumatic.  Neck: Neck supple.  Cardiovascular: Regular rhythm and intact distal pulses.  Tachycardia present.   Pulmonary/Chest: Breath sounds normal. No respiratory distress. She has no wheezes. She has no rales. She exhibits no tenderness.  Abdominal: Soft. Bowel sounds are normal. She exhibits no distension and no mass. There is no tenderness. There is no rebound and no guarding.  Lymphadenopathy:       Bilateral lower extremity lymphedema is chronic with associated blistering and ulcerations of bilateral legs.    Neurological: She is alert and oriented to person, place,  and time.  Skin: She is not diaphoretic.       ED Course  Procedures (including critical care time)  Labs Reviewed  GLUCOSE, CAPILLARY - Abnormal; Notable for the following:    Glucose-Capillary 145 (*)     All other components within normal limits  CBC WITH DIFFERENTIAL - Abnormal; Notable for the following:    WBC 12.7 (*)     Hemoglobin 10.8 (*)     HCT 32.1 (*)     Neutrophils Relative 94 (*)     Lymphocytes Relative 5 (*)     Monocytes Relative 1 (*)     Neutro Abs 12.0 (*)     Lymphs Abs 0.6 (*)     All other components within normal limits  BASIC METABOLIC PANEL - Abnormal; Notable for the following:    Sodium 131 (*)     Potassium 3.0 (*)     Chloride 93 (*)      Glucose, Bld 202 (*)     All other components within normal limits  URINALYSIS, ROUTINE W REFLEX MICROSCOPIC - Abnormal; Notable for the following:    Hgb urine dipstick TRACE (*)     All other components within normal limits  LACTIC ACID, PLASMA  URINE MICROSCOPIC-ADD ON  CULTURE, BLOOD (ROUTINE X 2)  CULTURE, BLOOD (ROUTINE X 2)   No results found.  8:52 AM Discussed patient with Dr Ethelda Chick who will also see the patient.  1. Cellulitis of lower leg   2. Multiple sclerosis   3. SIRS (systemic inflammatory response syndrome)       MDM  Pt admitted to Dr Sharyn Lull (on call for Dr Algie Coffer) for right leg cellulitis with fever, tachycardia.  Pt has had blood cultures drawn, antibiotics given in ED.  BP is normal.  Pt to be seen in ED and admitted by Dr Sharyn Lull.          Dillard Cannon [place], [place] 12/17/11 1212

## 2011-12-17 NOTE — ED Notes (Signed)
Report given to nurse on 4th floor. Pt to be transported to room 1438.

## 2011-12-17 NOTE — ED Provider Notes (Signed)
Medical screening examination/treatment/procedure(s) were conducted as a shared visit with non-physician practitioner(s) and myself.  I personally evaluated the patient during the encounter  Doug Sou, MD 12/17/11 1255

## 2011-12-18 LAB — GLUCOSE, CAPILLARY
Glucose-Capillary: 73 mg/dL (ref 70–99)
Glucose-Capillary: 76 mg/dL (ref 70–99)
Glucose-Capillary: 75 mg/dL (ref 70–99)
Glucose-Capillary: 68 mg/dL — ABNORMAL LOW (ref 70–99)
Glucose-Capillary: 68 mg/dL — ABNORMAL LOW (ref 70–99)

## 2011-12-18 LAB — BASIC METABOLIC PANEL
BUN: 10 mg/dL (ref 6–23)
Calcium: 8.5 mg/dL (ref 8.4–10.5)
GFR calc Af Amer: >90 mL/min (ref >90)
GFR calc non Af Amer: >90 mL/min (ref >90)
Glucose, Bld: 63 mg/dL — ABNORMAL LOW (ref 70–99)
Potassium: 3.1 mEq/L — ABNORMAL LOW (ref 3.5–5.1)
Sodium: 133 mEq/L — ABNORMAL LOW (ref 135–145)

## 2011-12-18 LAB — TSH: TSH: 0.555 u[IU]/mL (ref 0.350–4.500)

## 2011-12-18 LAB — HEMOGLOBIN A1C
Hgb A1c MFr Bld: 6 % — ABNORMAL HIGH (ref <5.7)
Mean Plasma Glucose: 126 mg/dL — ABNORMAL HIGH (ref <117)

## 2011-12-18 LAB — CBC
Hemoglobin: 8.7 g/dL — ABNORMAL LOW (ref 12.0–15.0)
MCH: 27.4 pg (ref 26.0–34.0)
MCHC: 33.1 g/dL (ref 30.0–36.0)
RDW: 13.7 % (ref 11.5–15.5)

## 2011-12-18 MED ORDER — ACETAMINOPHEN 325 MG PO TABS
650.0000 mg | ORAL_TABLET | Freq: Four times a day (QID) | ORAL | Status: DC | PRN
  Administered 2011-12-20: 650 mg via ORAL
  Filled 2011-12-18: qty 2

## 2011-12-18 MED ORDER — DEXTROSE 50 % IV SOLN
50.0000 mL | Freq: Once | INTRAVENOUS | Status: DC
  Filled 2011-12-18: qty 50

## 2011-12-18 MED ORDER — FUROSEMIDE 10 MG/ML IJ SOLN
40.0000 mg | Freq: Every day | INTRAMUSCULAR | Status: DC
  Administered 2011-12-18: 40 mg via INTRAVENOUS
  Filled 2011-12-18 (×2): qty 4

## 2011-12-18 MED ORDER — ACETAMINOPHEN 325 MG PO TABS
ORAL_TABLET | ORAL | Status: AC
  Administered 2011-12-18: 650 mg
  Filled 2011-12-18: qty 2

## 2011-12-18 MED ORDER — KCL IN DEXTROSE-NACL 20-5-0.45 MEQ/L-%-% IV SOLN
INTRAVENOUS | Status: DC
  Administered 2011-12-18 – 2011-12-21 (×2): via INTRAVENOUS
  Filled 2011-12-18 (×4): qty 1000

## 2011-12-18 NOTE — Progress Notes (Signed)
Subjective:  Marginal improvement. Hypokalemia continues.  Objective:  Vital Signs in the last 24 hours: Temp:  [97.7 F (36.5 C)-98.8 F (37.1 C)] 97.7 F (36.5 C) (07/01 0621) Pulse Rate:  [119-130] 119  (07/01 0621) Cardiac Rhythm:  [-] Sinus tachycardia (06/30 2300) Resp:  [16-24] 20  (07/01 0621) BP: (109-155)/(65-82) 130/73 mmHg (07/01 0621) SpO2:  [97 %-100 %] 100 % (07/01 0621) Weight:  [81.2 kg (179 lb 0.2 oz)] 81.2 kg (179 lb 0.2 oz) (06/30 1549)  Physical Exam: BP Readings from Last 1 Encounters:  12/18/11 130/73    Wt Readings from Last 1 Encounters:  12/17/11 81.2 kg (179 lb 0.2 oz)    Weight change:   HEENT: [place]/AT, Eyes-Brown, PERL, EOMI, Conjunctiva-Pink, Sclera-Non-icteric Neck: No JVD, No bruit, Trachea midline. Lungs:  Clear, Bilateral. Cardiac:  Regular rhythm, normal S1 and S2, no S3.  Abdomen:  Soft, non-tender. Extremities:  4 + edema with blisters present. No cyanosis. No clubbing. CNS: AxOx3, Cranial nerves grossly intact, moves all 4 extremities. Right handed. Skin: Warm and dry.   Intake/Output from previous day: 06/30 0701 - 07/01 0700 In: 1310 [P.O.:360; I.V.:900; IV Piggyback:50] Out: 200 [Urine:200]    Lab Results: BMET    Component Value Date/Time   NA 133* 12/18/2011 0435   K 3.1* 12/18/2011 0435   CL 100 12/18/2011 0435   CO2 25 12/18/2011 0435   GLUCOSE 63* 12/18/2011 0435   BUN 10 12/18/2011 0435   CREATININE 0.52 12/18/2011 0435   CALCIUM 8.5 12/18/2011 0435   GFRNONAA >90 12/18/2011 0435   GFRAA >90 12/18/2011 0435   CBC    Component Value Date/Time   WBC 15.5* 12/18/2011 0435   RBC 3.18* 12/18/2011 0435   HGB 8.7* 12/18/2011 0435   HCT 26.3* 12/18/2011 0435   PLT 307 12/18/2011 0435   MCV 82.7 12/18/2011 0435   MCH 27.4 12/18/2011 0435   MCHC 33.1 12/18/2011 0435   RDW 13.7 12/18/2011 0435   LYMPHSABS 0.7 12/17/2011 1639   MONOABS 0.4 12/17/2011 1639   EOSABS 0.0 12/17/2011 1639   BASOSABS 0.0 12/17/2011 1639   CARDIAC ENZYMES No results found for  this basename: CKTOTAL, CKMB, CKMBINDEX, TROPONINI    Assessment/Plan:  Patient Active Hospital Problem List: Cellulitis lower extremities  Hypertension  Diabetes mellitus  Chronic lymphedema  Multiple sclerosis  Chronic decubitus ulcers  Morbid obesity    LOS: 1 day    Orpah Cobb  MD  12/18/2011, 12:10 PM

## 2011-12-18 NOTE — Progress Notes (Signed)
CBG: 45 Treatment: 15 grams carbs, and breakfast  Symptoms: hungry  Follow-up CBG: Time: 0815 and 0835 CBG Result: 68 after 15 grams carbs, 76 after breakfast  Possible Reasons for Event: medication   Comments/MD notified: Dr. Algie Coffer. Pt has glipizide 5mg  ordered BID, med d/ced by MD.     Arta Bruce [facility]

## 2011-12-18 NOTE — Progress Notes (Signed)
CBG: 58  Treatment: meal tray arrived at this time  Symptoms: hungry  Follow-up CBG: Time:1329 CBG Result:75  Possible Reasons for Event: unknown  Comments/MD notified: pt ate breakfast and had at least two orange juices this am    Arta Bruce [facility]

## 2011-12-18 NOTE — Progress Notes (Signed)
CBG:58  Treatment: 15 GM carbohydrate snack  Symptoms: None  Follow-up CBG: Time:2300 CBG Result:73  Possible Reasons for Event: Unknown  Comments/MD notified:progress note    Willow Ora

## 2011-12-18 NOTE — Progress Notes (Signed)
   CARE MANAGEMENT NOTE 12/18/2011  Patient:  Jocelyn Sanchez, Jocelyn Sanchez   Account Number:  [id number]  Date Initiated:  12/18/2011  Documentation initiated by:  Jiles Crocker  Subjective/Objective Assessment:   ADMITTED WITH Cellulitis lower extremities     Action/Plan:   LIVES AT HOME WITH FAMILY, POSSIBLY NEED HOME HEALTH CARE AT DC, AWAITING PT/ OT EVALS   Anticipated DC Date:  12/25/2011   Anticipated DC Plan:  HOME W HOME HEALTH SERVICES      DC Planning Services  CM consult              Status of service:  In process, will continue to follow Medicare Important Message given?  NA - LOS <3 / Initial given by admissions (If response is "NO", the following Medicare IM given date fields will be blank):    Per UR Regulation:  Reviewed for med. necessity/level of care/duration of stay  Comments:  12/18/2011- B Emilie Carp RN, BSN, MHA

## 2011-12-18 NOTE — Consult Note (Signed)
WOC consult Note Reason for Consult:sacral pressure ulcer (Stage II), Bilateral LE edema with left LE weeping Wound type:pressure; venous vs infectious Pressure Ulcer POA: Yes Measurement:Sacral:  .8cm x 3cm x .2cm; LLE circular, partial thickness open areas embedded within an 18cm x 10cm area. Wound ZOX:WRUEA, pink, shallow (both), moist (left LE) Drainage (amount, consistency, odor) none from sacral; moderate amount of serous exudate from LLE Periwound:intact Dressing procedure/placement/frequency:Soft silicone foam dressing to sacral; petrolatum gauze with kerlix wrap twice daily to LLE I will not follow.  Please re-consult if needed. Thanks, Ladona Mow, MSN, RN, [facility], CWOCN [phone])

## 2011-12-18 NOTE — Progress Notes (Signed)
Inpatient Diabetes Program Recommendations  AACE/ADA: New Consensus Statement on Inpatient Glycemic Control (2009)  Target Ranges:  Prepandial:   less than 140 mg/dL      Peak postprandial:   less than 180 mg/dL (1-2 hours)      Critically ill patients:  140 - 180 mg/dL   Reason for Visit: Hyperglycemia / Hypoglycemia  Results for Jocelyn Sanchez, Jocelyn Sanchez (MRN [id number]) as of 12/18/2011 10:10  Ref. Range 12/17/2011 08:41 12/17/2011 13:33 12/17/2011 16:44 12/17/2011 22:04 12/17/2011 23:56 12/18/2011 02:09 12/18/2011 08:33  Glucose-Capillary Latest Range: 70-99 mg/dL 914 (H) 782 (H) 956 (H) 58 (L) 73 80 76  Results for Jocelyn Sanchez, Jocelyn Sanchez (MRN [id number]) as of 12/18/2011 10:10  Ref. Range 05/13/2011 05:30 12/17/2011 09:07 12/17/2011 16:39 12/18/2011 04:35  Glucose Latest Range: 70-99 mg/dL 469 (H) 629 (H) 528 (H) 63 (L)    Inpatient Diabetes Program Recommendations HgbA1C: Needs updated HgbA1C to assess glycemic control prior to hospitalization - Last one 05/11/2011 - 10.3%  Note: Will follow.

## 2011-12-19 LAB — GLUCOSE, CAPILLARY
Glucose-Capillary: 135 mg/dL — ABNORMAL HIGH (ref 70–99)
Glucose-Capillary: 83 mg/dL (ref 70–99)

## 2011-12-19 LAB — BASIC METABOLIC PANEL
BUN: 9 mg/dL (ref 6–23)
CO2: 25 mEq/L (ref 19–32)
Calcium: 8.2 mg/dL — ABNORMAL LOW (ref 8.4–10.5)
Chloride: 98 mEq/L (ref 96–112)
Creatinine, Ser: 0.4 mg/dL — ABNORMAL LOW (ref 0.50–1.10)

## 2011-12-19 MED ORDER — FUROSEMIDE 10 MG/ML IJ SOLN
40.0000 mg | Freq: Two times a day (BID) | INTRAMUSCULAR | Status: DC
  Administered 2011-12-19 – 2011-12-23 (×8): 40 mg via INTRAVENOUS
  Filled 2011-12-19 (×11): qty 4

## 2011-12-19 MED ORDER — BACLOFEN 5 MG HALF TABLET
5.0000 mg | ORAL_TABLET | Freq: Four times a day (QID) | ORAL | Status: DC
  Administered 2011-12-19 – 2011-12-21 (×8): 5 mg via ORAL
  Administered 2011-12-21: 14:00:00 via ORAL
  Administered 2011-12-21 – 2011-12-23 (×7): 5 mg via ORAL
  Filled 2011-12-19 (×20): qty 1

## 2011-12-19 MED ORDER — POTASSIUM CHLORIDE CRYS ER 20 MEQ PO TBCR
20.0000 meq | EXTENDED_RELEASE_TABLET | Freq: Three times a day (TID) | ORAL | Status: DC
  Administered 2011-12-19 – 2011-12-22 (×9): 20 meq via ORAL
  Filled 2011-12-19 (×11): qty 1

## 2011-12-19 NOTE — Progress Notes (Signed)
Subjective:  No chest pain. Asking for beclophen.  Objective:  Vital Signs in the last 24 hours: Temp:  [98.7 F (37.1 C)-101.5 F (38.6 C)] 98.7 F (37.1 C) (07/02 0441) Pulse Rate:  [98-114] 111  (07/02 1130) Cardiac Rhythm:  [-] Sinus tachycardia (07/02 0830) Resp:  [18-22] 20  (07/02 0441) BP: (97-129)/(57-81) 129/81 mmHg (07/02 1130) SpO2:  [96 %-100 %] 97 % (07/02 0441)  Physical Exam: BP Readings from Last 1 Encounters:  12/19/11 129/81    Wt Readings from Last 1 Encounters:  12/17/11 81.2 kg (179 lb 0.2 oz)    Weight change:   HEENT: [place]/AT, Eyes-Brown, PERL, EOMI, Conjunctiva-Pink, Sclera-Non-icteric Neck: No JVD, No bruit, Trachea midline. Lungs:  Clear, Bilateral. Cardiac:  Regular rhythm, normal S1 and S2, no S3.  Abdomen:  Soft, non-tender. Extremities:  3 + edema present. Dressing applied to both legs. No cyanosis. No clubbing. CNS: AxOx3, Cranial nerves grossly intact, moves all 4 extremities. Right handed. Skin: Warm and dry.   Intake/Output from previous day: 07/01 0701 - 07/02 0700 In: 1155 [P.O.:240; I.V.:765; IV Piggyback:150] Out: 800 [Urine:800]    Lab Results: BMET    Component Value Date/Time   NA 132* 12/19/2011 0437   K 3.2* 12/19/2011 0437   CL 98 12/19/2011 0437   CO2 25 12/19/2011 0437   GLUCOSE 84 12/19/2011 0437   BUN 9 12/19/2011 0437   CREATININE 0.40* 12/19/2011 0437   CALCIUM 8.2* 12/19/2011 0437   GFRNONAA >90 12/19/2011 0437   GFRAA >90 12/19/2011 0437   CBC    Component Value Date/Time   WBC 15.5* 12/18/2011 0435   RBC 3.18* 12/18/2011 0435   HGB 8.7* 12/18/2011 0435   HCT 26.3* 12/18/2011 0435   PLT 307 12/18/2011 0435   MCV 82.7 12/18/2011 0435   MCH 27.4 12/18/2011 0435   MCHC 33.1 12/18/2011 0435   RDW 13.7 12/18/2011 0435   LYMPHSABS 0.7 12/17/2011 1639   MONOABS 0.4 12/17/2011 1639   EOSABS 0.0 12/17/2011 1639   BASOSABS 0.0 12/17/2011 1639   CARDIAC ENZYMES No results found for this basename: CKTOTAL, CKMB, CKMBINDEX, TROPONINI     Assessment/Plan:  Patient Active Hospital Problem List:  Cellulitis lower extremities  Hypertension  Diabetes mellitus  Chronic lymphedema  Multiple sclerosis  Chronic decubitus ulcers  Morbid obesity Hypokalemia- improving Continue diuresis   LOS: 2 days    Orpah Cobb  MD  12/19/2011, 1:43 PM

## 2011-12-20 LAB — CBC
Hemoglobin: 8.2 g/dL — ABNORMAL LOW (ref 12.0–15.0)
MCH: 27.2 pg (ref 26.0–34.0)
RBC: 3.01 MIL/uL — ABNORMAL LOW (ref 3.87–5.11)
WBC: 12.6 10*3/uL — ABNORMAL HIGH (ref 4.0–10.5)

## 2011-12-20 LAB — BASIC METABOLIC PANEL
CO2: 25 mEq/L (ref 19–32)
Calcium: 8.3 mg/dL — ABNORMAL LOW (ref 8.4–10.5)
Chloride: 97 mEq/L (ref 96–112)
Glucose, Bld: 142 mg/dL — ABNORMAL HIGH (ref 70–99)
Sodium: 132 mEq/L — ABNORMAL LOW (ref 135–145)

## 2011-12-20 LAB — GLUCOSE, CAPILLARY
Glucose-Capillary: 120 mg/dL — ABNORMAL HIGH (ref 70–99)
Glucose-Capillary: 155 mg/dL — ABNORMAL HIGH (ref 70–99)
Glucose-Capillary: 152 mg/dL — ABNORMAL HIGH (ref 70–99)
Glucose-Capillary: 165 mg/dL — ABNORMAL HIGH (ref 70–99)
Glucose-Capillary: 144 mg/dL — ABNORMAL HIGH (ref 70–99)

## 2011-12-20 NOTE — Progress Notes (Signed)
Brief Nutrition Note  Pt with questions regarding current diet concerns.  Spoke with pt.  Discussed current diet briefly and effect on what can be ordered for meals.  Blood sugars have been low in the evening.  Told patient to ask for HS snack.  Diet CHO MOD LOW  Oran Rein, RD [phone]

## 2011-12-20 NOTE — Progress Notes (Signed)
Subjective:  Feeling better. No chest pain.  Objective:  Vital Signs in the last 24 hours: Temp:  [98.9 F (37.2 C)-100.4 F (38 C)] 99.4 F (37.4 C) (07/03 0550) Pulse Rate:  [91-120] 120  (07/03 0917) Cardiac Rhythm:  [-] Sinus tachycardia (07/02 2105) Resp:  [20] 20  (07/03 0550) BP: (110-123)/(70-76) 110/74 mmHg (07/03 0917) SpO2:  [92 %-97 %] 97 % (07/03 0550)  Physical Exam: BP Readings from Last 1 Encounters:  12/20/11 110/74    Wt Readings from Last 1 Encounters:  12/17/11 81.2 kg (179 lb 0.2 oz)    Weight change:   HEENT: [place]/AT, Eyes-Brown, PERL, EOMI, Conjunctiva-Pale pink, Sclera-Non-icteric. Wears glasses. Neck: No JVD, No bruit, Trachea midline. Lungs:  Clear, Bilateral. Cardiac:  Regular rhythm, normal S1 and S2, no S3.  Abdomen:  Soft, non-tender. Extremities:  1-2 + edema present. No cyanosis. No clubbing. Dressing applied to both lower leg. CNS: AxOx3, Cranial nerves grossly intact, moves all 4 extremities. Right handed. Skin: Warm and dry.   Intake/Output from previous day: 07/02 0701 - 07/03 0700 In: 975.7 [P.O.:600; I.V.:325.7; IV Piggyback:50] Out: 1200 [Urine:1200]    Lab Results: BMET    Component Value Date/Time   NA 132* 12/20/2011 0455   K 3.3* 12/20/2011 0455   CL 97 12/20/2011 0455   CO2 25 12/20/2011 0455   GLUCOSE 142* 12/20/2011 0455   BUN 8 12/20/2011 0455   CREATININE 0.36* 12/20/2011 0455   CALCIUM 8.3* 12/20/2011 0455   GFRNONAA >90 12/20/2011 0455   GFRAA >90 12/20/2011 0455   CBC    Component Value Date/Time   WBC 12.6* 12/20/2011 0455   RBC 3.01* 12/20/2011 0455   HGB 8.2* 12/20/2011 0455   HCT 24.5* 12/20/2011 0455   PLT 330 12/20/2011 0455   MCV 81.4 12/20/2011 0455   MCH 27.2 12/20/2011 0455   MCHC 33.5 12/20/2011 0455   RDW 14.0 12/20/2011 0455   LYMPHSABS 0.7 12/17/2011 1639   MONOABS 0.4 12/17/2011 1639   EOSABS 0.0 12/17/2011 1639   BASOSABS 0.0 12/17/2011 1639   CARDIAC ENZYMES No results found for this basename: CKTOTAL, CKMB, CKMBINDEX,  TROPONINI    Assessment/Plan:  Patient Active Hospital Problem List: Cellulitis lower extremities  Hypertension  Diabetes mellitus  Chronic lymphedema  Multiple sclerosis  Chronic decubitus ulcers  Morbid obesity  Hypokalemia- improving further   Continue diuresis Increase activity as tolerated.    LOS: 3 days    Orpah Cobb  MD  12/20/2011, 11:38 AM

## 2011-12-21 LAB — BASIC METABOLIC PANEL
Chloride: 99 mEq/L (ref 96–112)
GFR calc Af Amer: >90 mL/min (ref >90)
GFR calc non Af Amer: >90 mL/min (ref >90)
Potassium: 3.7 mEq/L (ref 3.5–5.1)
Sodium: 135 mEq/L (ref 135–145)

## 2011-12-21 LAB — CBC
HCT: 25.3 % — ABNORMAL LOW (ref 36.0–46.0)
MCHC: 32.4 g/dL (ref 30.0–36.0)
Platelets: 350 10*3/uL (ref 150–400)
RDW: 14.1 % (ref 11.5–15.5)
WBC: 11.7 10*3/uL — ABNORMAL HIGH (ref 4.0–10.5)

## 2011-12-21 LAB — GLUCOSE, CAPILLARY: Glucose-Capillary: 157 mg/dL — ABNORMAL HIGH (ref 70–99)

## 2011-12-21 NOTE — Progress Notes (Signed)
Subjective:  Eager to go home.   Objective:  Vital Signs in the last 24 hours: Temp:  [98.6 F (37 C)-101.5 F (38.6 C)] 99.4 F (37.4 C) (07/04 0530) Pulse Rate:  [98-120] 98  (07/04 0530) Cardiac Rhythm:  [-] Sinus tachycardia (07/03 2233) Resp:  [20-22] 20  (07/04 0530) BP: (110-129)/(74-80) 114/74 mmHg (07/04 0530) SpO2:  [97 %-98 %] 98 % (07/04 0530)  Physical Exam: BP Readings from Last 1 Encounters:  12/21/11 114/74    Wt Readings from Last 1 Encounters:  12/17/11 81.2 kg (179 lb 0.2 oz)    Weight change:   HEENT: [place]/AT, Eyes-Brown, PERL, EOMI, Conjunctiva-Pale, Sclera-Non-icteric Neck: No JVD, No bruit, Trachea midline. Lungs:  Clear, Bilateral. Cardiac:  Regular rhythm, normal S1 and S2, no S3.  Abdomen:  Soft, non-tender. Extremities:  1 + edema present. No cyanosis. No clubbing. CNS: AxOx3, Cranial nerves grossly intact, moves all 4 extremities. Right handed. Skin: Warm and dry.   Intake/Output from previous day: 07/03 0701 - 07/04 0700 In: 622.8 [P.O.:240; I.V.:232.8; IV Piggyback:150] Out: 650 [Urine:650]    Lab Results: BMET    Component Value Date/Time   NA 135 12/21/2011 0440   K 3.7 12/21/2011 0440   CL 99 12/21/2011 0440   CO2 27 12/21/2011 0440   GLUCOSE 146* 12/21/2011 0440   BUN 8 12/21/2011 0440   CREATININE 0.36* 12/21/2011 0440   CALCIUM 8.4 12/21/2011 0440   GFRNONAA >90 12/21/2011 0440   GFRAA >90 12/21/2011 0440   CBC    Component Value Date/Time   WBC 11.7* 12/21/2011 0440   RBC 3.08* 12/21/2011 0440   HGB 8.2* 12/21/2011 0440   HCT 25.3* 12/21/2011 0440   PLT 350 12/21/2011 0440   MCV 82.1 12/21/2011 0440   MCH 26.6 12/21/2011 0440   MCHC 32.4 12/21/2011 0440   RDW 14.1 12/21/2011 0440   LYMPHSABS 0.7 12/17/2011 1639   MONOABS 0.4 12/17/2011 1639   EOSABS 0.0 12/17/2011 1639   BASOSABS 0.0 12/17/2011 1639   CARDIAC ENZYMES No results found for this basename: CKTOTAL, CKMB, CKMBINDEX, TROPONINI    Assessment/Plan:  Patient Active Hospital Problem  List: Cellulitis lower extremities  Hypertension  Diabetes mellitus  Chronic lymphedema  Multiple sclerosis  Chronic decubitus ulcers  Morbid obesity  Hypokalemia- resolved  Continue diuresis  Increase activity as tolerated.     LOS: 4 days    Orpah Cobb  MD  12/21/2011, 8:12 AM

## 2011-12-22 LAB — BASIC METABOLIC PANEL
BUN: 8 mg/dL (ref 6–23)
Creatinine, Ser: 0.36 mg/dL — ABNORMAL LOW (ref 0.50–1.10)
GFR calc Af Amer: >90 mL/min (ref >90)
GFR calc non Af Amer: >90 mL/min (ref >90)
Glucose, Bld: 182 mg/dL — ABNORMAL HIGH (ref 70–99)
Potassium: 3.6 mEq/L (ref 3.5–5.1)

## 2011-12-22 LAB — GLUCOSE, CAPILLARY
Glucose-Capillary: 45 mg/dL — ABNORMAL LOW (ref 70–99)
Glucose-Capillary: 68 mg/dL — ABNORMAL LOW (ref 70–99)
Glucose-Capillary: 139 mg/dL — ABNORMAL HIGH (ref 70–99)

## 2011-12-22 MED ORDER — POTASSIUM CHLORIDE CRYS ER 20 MEQ PO TBCR
20.0000 meq | EXTENDED_RELEASE_TABLET | Freq: Three times a day (TID) | ORAL | Status: DC
  Administered 2011-12-22 – 2011-12-23 (×3): 20 meq via ORAL
  Filled 2011-12-22 (×5): qty 1

## 2011-12-22 NOTE — Progress Notes (Signed)
Talked to patient with family present about DCP; patient called bedside nurse requesting that the patient stay an extra day due to no one home to care for her; Informed the patient that her insurance may not cover her stay for today due to family request for her to stay in the hospital and that the family have to make arrangements for her to come home when medically ready; Patient has a personal caregiver that comes by to see her 4 hrs a day, 7 days a week and also has a personal friend to help her at home; Offered HHC, patient refused x 2, stated "I have enough help at home and I am fine". Patient is expecting to go home tomorrow; B Lobbyist, BSN, [place].

## 2011-12-22 NOTE — Progress Notes (Signed)
Subjective:  Wants to go home tomorrow as there is no help at home for bed-ridden patient with MS.  Objective:  Vital Signs in the last 24 hours: Temp:  [98.2 F (36.8 C)-101.6 F (38.7 C)] 98.2 F (36.8 C) (07/05 0520) Pulse Rate:  [99-107] 99  (07/05 0520) Cardiac Rhythm:  [-] Sinus tachycardia (07/04 2000) Resp:  [18-20] 18  (07/05 0520) BP: (101-117)/(64-74) 103/67 mmHg (07/05 0520) SpO2:  [97 %-99 %] 97 % (07/05 0520)  Physical Exam: BP Readings from Last 1 Encounters:  12/22/11 103/67    Wt Readings from Last 1 Encounters:  12/17/11 81.2 kg (179 lb 0.2 oz)    Weight change:   HEENT: [place]/AT, Eyes-Brown, PERL, EOMI, Conjunctiva-Pale, Sclera-Non-icteric Neck: No JVD, No bruit, Trachea midline. Lungs:  Clear, Bilateral. Cardiac:  Regular rhythm, normal S1 and S2, no S3.  Abdomen:  Soft, non-tender. Extremities:  Trace edema present. No cyanosis. No clubbing. Small sacral decubitus-healing. Non-draining bilateral lower leg blisters. CNS: AxOx3, Cranial nerves grossly intact, moves both upper extremities. Moves both lower extremities to pain. Right handed. Skin: Warm and dry.   Intake/Output from previous day: 07/04 0701 - 07/05 0700 In: 514 [P.O.:340; I.V.:120; IV Piggyback:54] Out: 450 [Urine:450]    Lab Results: BMET    Component Value Date/Time   NA 133* 12/22/2011 0435   K 3.6 12/22/2011 0435   CL 96 12/22/2011 0435   CO2 27 12/22/2011 0435   GLUCOSE 182* 12/22/2011 0435   BUN 8 12/22/2011 0435   CREATININE 0.36* 12/22/2011 0435   CALCIUM 8.3* 12/22/2011 0435   GFRNONAA >90 12/22/2011 0435   GFRAA >90 12/22/2011 0435   CBC    Component Value Date/Time   WBC 11.7* 12/21/2011 0440   RBC 3.08* 12/21/2011 0440   HGB 8.2* 12/21/2011 0440   HCT 25.3* 12/21/2011 0440   PLT 350 12/21/2011 0440   MCV 82.1 12/21/2011 0440   MCH 26.6 12/21/2011 0440   MCHC 32.4 12/21/2011 0440   RDW 14.1 12/21/2011 0440   LYMPHSABS 0.7 12/17/2011 1639   MONOABS 0.4 12/17/2011 1639   EOSABS 0.0 12/17/2011 1639   BASOSABS 0.0 12/17/2011 1639   CARDIAC ENZYMES No results found for this basename: CKTOTAL, CKMB, CKMBINDEX, TROPONINI    Assessment/Plan:  Patient Active Hospital Problem List:  Cellulitis lower extremities   Improving Hypertension   Stable Diabetes mellitus   Stable Chronic lymphedema   Stable Multiple sclerosis   Unchanged Chronic decubitus ulcers   Improving Morbid obesity  Hypokalemia  Resolved   Continue diuresis  Increase activity as tolerated.    LOS: 5 days    Orpah Cobb  MD  12/22/2011, 10:30 AM

## 2011-12-22 NOTE — Progress Notes (Signed)
Pt's sister, Criss Rosales, called regarding discharge of pt today, stating that there is no one home to take care of her. Family is going out-of-town today for funeral and the pt's home health aide is on vacation til tomorrow 12/23/11. Family requesting pt stay in hospital til tomorrow.

## 2011-12-23 LAB — CBC
HCT: 25.2 % — ABNORMAL LOW (ref 36.0–46.0)
MCH: 27.2 pg (ref 26.0–34.0)
MCHC: 32.9 g/dL (ref 30.0–36.0)
MCV: 82.6 fL (ref 78.0–100.0)
RDW: 14.4 % (ref 11.5–15.5)

## 2011-12-23 LAB — GLUCOSE, CAPILLARY: Glucose-Capillary: 167 mg/dL — ABNORMAL HIGH (ref 70–99)

## 2011-12-23 LAB — CULTURE, BLOOD (ROUTINE X 2): Culture: NO GROWTH

## 2011-12-23 LAB — BASIC METABOLIC PANEL
BUN: 9 mg/dL (ref 6–23)
Calcium: 9 mg/dL (ref 8.4–10.5)
Creatinine, Ser: 0.38 mg/dL — ABNORMAL LOW (ref 0.50–1.10)
GFR calc Af Amer: >90 mL/min (ref >90)
GFR calc non Af Amer: >90 mL/min (ref >90)

## 2011-12-23 MED ORDER — BACLOFEN 10 MG PO TABS: 5.0000 mg | ORAL_TABLET | Freq: Three times a day (TID) | ORAL | Status: DC

## 2011-12-23 MED ORDER — METOPROLOL TARTRATE 25 MG PO TABS: 25.0000 mg | ORAL_TABLET | Freq: Two times a day (BID) | ORAL | Status: DC

## 2011-12-23 MED ORDER — GLIPIZIDE 5 MG PO TABS: 2.5000 mg | ORAL_TABLET | Freq: Two times a day (BID) | ORAL | Status: DC

## 2011-12-23 MED ORDER — CIPROFLOXACIN HCL 500 MG PO TABS: 500.0000 mg | ORAL_TABLET | Freq: Two times a day (BID) | ORAL | Status: AC

## 2011-12-23 NOTE — Progress Notes (Signed)
Pt to be d/c today to  Home ([address] [place], [place] [address]) today .  Pt and family agreeable.   Plan transfer via EMS to home address.   PTAR notified.   Leron Croak, LCSWA [employer] [phone]

## 2011-12-23 NOTE — Discharge Summary (Signed)
Physician Discharge Summary  Patient ID: HOUSTON SURGES MRN: [id number] DOB/AGE: [birth year] 56 y.o.  Admit date: 12/17/2011 Discharge date: 12/23/2011  Admission Diagnoses: Cellulitis lower extremities  Hypertension   Diabetes mellitus  Chronic lymphedema  Multiple sclerosis   Chronic decubitus ulcers  Morbid obesity  Hypokalemia  Discharge Diagnoses:  Active Problems: Principle problem *Bilateral lower leg cellulitis* Hypertension   Diabetes mellitus  Chronic lymphedema  Multiple sclerosis   Chronic decubitus ulcers  Morbid obesity  Hypokalemia   Discharged Condition: good  Hospital Course: 57 years old female with MS and paraplegia had bilateral leg edema and cellulitis of lower extremities along with fever and leukocytosis. With IV lasix and antibiotics her cellulitis and decubitus ulder improved. She was discharged home in stable condition with follow up in 1 week.  Consults: Wound care consult  Significant Diagnostic Studies: labs: WBC-12.7, Hgb 10.8. [id number], K+ 3.0 to 4.0, Glucose 202 on admission, 54, 167 mg/dl today.  Treatments: antibiotics: Ancef and cardiac meds: furosemide  Discharge Exam: Blood pressure 116/69, pulse 72, temperature 99.1 F (37.3 C), temperature source Oral, resp. rate 18, height 5\' 3"  (1.6 m), weight 81.2 kg (179 lb 0.2 oz), SpO2 97.00%. HEENT: [place]/AT, Eyes-Brown, PERL, EOMI, Conjunctiva-Pale, Sclera-Non-icteric  Neck: No JVD, No bruit, Trachea midline.  Lungs: Clear, Bilateral.  Cardiac: Regular rhythm, normal S1 and S2, no S3.  Abdomen: Soft, non-tender.  Extremities: Trace edema present. No cyanosis. No clubbing. Small sacral decubitus-healing. Non-draining bilateral lower leg blisters.  CNS: AxOx3, Cranial nerves grossly intact, moves both upper extremities. Moves both lower extremities to pain. Right handed.  Skin: Warm and dry   Disposition: 06-Home-Health Care Svc   Medication List  As of 12/23/2011 11:42 AM   STOP taking  these medications         ibuprofen 200 MG tablet         TAKE these medications         baclofen 10 MG tablet   Commonly known as: LIORESAL   Take 0.5 tablets (5 mg total) by mouth 3 (three) times daily.      ciprofloxacin 500 MG tablet   Commonly known as: CIPRO   Take 1 tablet (500 mg total) by mouth 2 (two) times daily.      docusate sodium 100 MG capsule   Commonly known as: COLACE   Take 100 mg by mouth daily.      glipiZIDE 5 MG tablet   Commonly known as: GLUCOTROL   Take 0.5 tablets (2.5 mg total) by mouth 2 (two) times daily before a meal.      interferon beta-1a 30 MCG/0.5ML injection   Commonly known as: AVONEX   Inject 30 mcg into the muscle every 7 (seven) days.      metoprolol tartrate 25 MG tablet   Commonly known as: LOPRESSOR   Take 1 tablet (25 mg total) by mouth 2 (two) times daily.      polyethylene glycol packet   Commonly known as: MIRALAX / GLYCOLAX   Take 17 g by mouth daily.             SignedOrpah Cobb S 12/23/2011, 11:42 AM

## 2011-12-25 LAB — GLUCOSE, CAPILLARY: Glucose-Capillary: 106 mg/dL — ABNORMAL HIGH (ref 70–99)

## 2012-03-01 ENCOUNTER — Encounter (HOSPITAL_COMMUNITY): Payer: Self-pay | Admitting: *Deleted

## 2012-03-01 ENCOUNTER — Inpatient Hospital Stay (HOSPITAL_COMMUNITY)
Admission: EM | Admit: 2012-03-01 | Discharge: 2012-03-11 | DRG: 603 | Disposition: A | Discharge status: Skilled Nursing Facility | Payer: PRIVATE HEALTH INSURANCE | Attending: Cardiovascular Disease | Admitting: Cardiovascular Disease

## 2012-03-01 DIAGNOSIS — E871 Hypo-osmolality and hyponatremia: Secondary | ICD-10-CM | POA: Diagnosis present

## 2012-03-01 DIAGNOSIS — L97909 Non-pressure chronic ulcer of unspecified part of unspecified lower leg with unspecified severity: Secondary | ICD-10-CM | POA: Diagnosis present

## 2012-03-01 DIAGNOSIS — L0231 Cutaneous abscess of buttock: Secondary | ICD-10-CM | POA: Diagnosis present

## 2012-03-01 DIAGNOSIS — L03115 Cellulitis of right lower limb: Secondary | ICD-10-CM

## 2012-03-01 DIAGNOSIS — I890 Lymphedema, not elsewhere classified: Secondary | ICD-10-CM | POA: Diagnosis present

## 2012-03-01 DIAGNOSIS — G35 Multiple sclerosis: Secondary | ICD-10-CM | POA: Diagnosis present

## 2012-03-01 DIAGNOSIS — N390 Urinary tract infection, site not specified: Secondary | ICD-10-CM | POA: Diagnosis present

## 2012-03-01 DIAGNOSIS — D509 Iron deficiency anemia, unspecified: Secondary | ICD-10-CM | POA: Diagnosis present

## 2012-03-01 DIAGNOSIS — N289 Disorder of kidney and ureter, unspecified: Secondary | ICD-10-CM | POA: Diagnosis present

## 2012-03-01 DIAGNOSIS — L03119 Cellulitis of unspecified part of limb: Principal | ICD-10-CM | POA: Diagnosis present

## 2012-03-01 DIAGNOSIS — M6281 Muscle weakness (generalized): Secondary | ICD-10-CM | POA: Diagnosis present

## 2012-03-01 DIAGNOSIS — R509 Fever, unspecified: Secondary | ICD-10-CM

## 2012-03-01 DIAGNOSIS — N182 Chronic kidney disease, stage 2 (mild): Secondary | ICD-10-CM | POA: Diagnosis present

## 2012-03-01 DIAGNOSIS — L02419 Cutaneous abscess of limb, unspecified: Principal | ICD-10-CM | POA: Diagnosis present

## 2012-03-01 DIAGNOSIS — E876 Hypokalemia: Secondary | ICD-10-CM

## 2012-03-01 LAB — URINALYSIS, ROUTINE W REFLEX MICROSCOPIC
Glucose, UA: NEGATIVE mg/dL
Ketones, ur: 15 mg/dL — AB
Protein, ur: NEGATIVE mg/dL
Urobilinogen, UA: 0.2 mg/dL (ref 0.0–1.0)

## 2012-03-01 LAB — BASIC METABOLIC PANEL
BUN: 14 mg/dL (ref 6–23)
Calcium: 9.5 mg/dL (ref 8.4–10.5)
Creatinine, Ser: 0.46 mg/dL — ABNORMAL LOW (ref 0.50–1.10)
GFR calc Af Amer: >90 mL/min (ref >90)
GFR calc non Af Amer: >90 mL/min (ref >90)

## 2012-03-01 LAB — CBC
HCT: 35 % — ABNORMAL LOW (ref 36.0–46.0)
MCHC: 32.6 g/dL (ref 30.0–36.0)
Platelets: 525 10*3/uL — ABNORMAL HIGH (ref 150–400)
RDW: 14.5 % (ref 11.5–15.5)
WBC: 12.5 10*3/uL — ABNORMAL HIGH (ref 4.0–10.5)

## 2012-03-01 LAB — HEPATIC FUNCTION PANEL
AST: 17 U/L (ref 0–37)
Albumin: 3.6 g/dL (ref 3.5–5.2)
Total Protein: 8.1 g/dL (ref 6.0–8.3)

## 2012-03-01 LAB — URINE MICROSCOPIC-ADD ON

## 2012-03-01 LAB — GLUCOSE, CAPILLARY: Glucose-Capillary: 166 mg/dL — ABNORMAL HIGH (ref 70–99)

## 2012-03-01 MED ORDER — SODIUM CHLORIDE 0.9 % IV SOLN
Freq: Once | INTRAVENOUS | Status: AC
  Administered 2012-03-01: 12:00:00 via INTRAVENOUS

## 2012-03-01 MED ORDER — SODIUM CHLORIDE 0.9 % IV BOLUS (SEPSIS)
1000.0000 mL | Freq: Once | INTRAVENOUS | Status: AC
  Administered 2012-03-01: 1000 mL via INTRAVENOUS

## 2012-03-01 MED ORDER — DEXTROSE 5 % IV SOLN
1.0000 g | Freq: Once | INTRAVENOUS | Status: AC
  Administered 2012-03-01: 1 g via INTRAVENOUS
  Filled 2012-03-01: qty 10

## 2012-03-01 MED ORDER — SODIUM CHLORIDE 0.9 % IJ SOLN
3.0000 mL | Freq: Two times a day (BID) | INTRAMUSCULAR | Status: DC
  Administered 2012-03-01 – 2012-03-07 (×4): 3 mL via INTRAVENOUS

## 2012-03-01 MED ORDER — DIPHENHYDRAMINE HCL 25 MG PO CAPS
25.0000 mg | ORAL_CAPSULE | Freq: Every evening | ORAL | Status: DC | PRN
  Administered 2012-03-06 – 2012-03-10 (×4): 25 mg via ORAL
  Filled 2012-03-01 (×3): qty 1
  Filled 2012-03-01: qty 2

## 2012-03-01 MED ORDER — ACETAMINOPHEN 325 MG PO TABS
650.0000 mg | ORAL_TABLET | Freq: Four times a day (QID) | ORAL | Status: DC | PRN
  Administered 2012-03-01 – 2012-03-10 (×14): 650 mg via ORAL
  Filled 2012-03-01 (×14): qty 2

## 2012-03-01 MED ORDER — ONDANSETRON HCL 4 MG PO TABS: 4.0000 mg | ORAL_TABLET | Freq: Four times a day (QID) | ORAL | Status: DC | PRN

## 2012-03-01 MED ORDER — HEPARIN SODIUM (PORCINE) 5000 UNIT/ML IJ SOLN
5000.0000 [IU] | Freq: Three times a day (TID) | INTRAMUSCULAR | Status: DC
  Administered 2012-03-01 – 2012-03-11 (×26): 5000 [IU] via SUBCUTANEOUS
  Filled 2012-03-01 (×33): qty 1

## 2012-03-01 MED ORDER — ONDANSETRON HCL 4 MG/2ML IJ SOLN: 4.0000 mg | Freq: Four times a day (QID) | INTRAMUSCULAR | Status: DC | PRN

## 2012-03-01 MED ORDER — DEXTROSE 5 % IV SOLN
1.0000 g | INTRAVENOUS | Status: DC
  Administered 2012-03-02: 1 g via INTRAVENOUS
  Filled 2012-03-01: qty 10

## 2012-03-01 MED ORDER — HYDROCODONE-ACETAMINOPHEN 5-325 MG PO TABS
1.0000 | ORAL_TABLET | Freq: Four times a day (QID) | ORAL | Status: DC | PRN
  Administered 2012-03-01 – 2012-03-06 (×7): 1 via ORAL
  Filled 2012-03-01 (×7): qty 1

## 2012-03-01 MED ORDER — DIPHENHYDRAMINE-APAP (SLEEP) 25-500 MG PO TABS: 1.0000 | ORAL_TABLET | Freq: Every evening | ORAL | Status: DC | PRN

## 2012-03-01 MED ORDER — ACETAMINOPHEN 500 MG PO TABS
1000.0000 mg | ORAL_TABLET | Freq: Once | ORAL | Status: AC
  Administered 2012-03-01: 1000 mg via ORAL
  Filled 2012-03-01: qty 2

## 2012-03-01 MED ORDER — BACLOFEN 5 MG HALF TABLET
5.0000 mg | ORAL_TABLET | Freq: Three times a day (TID) | ORAL | Status: DC
  Administered 2012-03-01 – 2012-03-11 (×32): 5 mg via ORAL
  Filled 2012-03-01 (×35): qty 1

## 2012-03-01 MED ORDER — DEXTROSE-NACL 5-0.45 % IV SOLN
INTRAVENOUS | Status: AC
  Administered 2012-03-01 – 2012-03-02 (×2): via INTRAVENOUS

## 2012-03-01 MED ORDER — POTASSIUM CHLORIDE CRYS ER 10 MEQ PO TBCR
10.0000 meq | EXTENDED_RELEASE_TABLET | Freq: Two times a day (BID) | ORAL | Status: AC
  Administered 2012-03-01 – 2012-03-02 (×4): 10 meq via ORAL
  Filled 2012-03-01 (×4): qty 1

## 2012-03-01 MED ORDER — FUROSEMIDE 10 MG/ML IJ SOLN
40.0000 mg | Freq: Every day | INTRAMUSCULAR | Status: AC
  Administered 2012-03-01 – 2012-03-02 (×2): 40 mg via INTRAVENOUS
  Filled 2012-03-01 (×2): qty 4

## 2012-03-01 NOTE — ED Notes (Addendum)
ems reports pt is from home. Pt caregiver was present. ems was called, reports pt is diabetic and cbg 235. Upon ems arrival cbg 161. Pt has hx of MS, no other problems. From MS pt is weak on left side, that is pt baseline. No other deficits. Pt is in lift harness. Pt reports she urinates on herself. Pt c/o weakness x1 day, and n/v x1.   Pt has not eaten this morning, took medications. Pt reports she is sleepy.

## 2012-03-01 NOTE — ED Notes (Signed)
md at bedside

## 2012-03-01 NOTE — ED Notes (Signed)
RN to obtain labs with start of IV 

## 2012-03-01 NOTE — ED Notes (Signed)
Report given to anita, rn   Report given that blood cultures have been drawn, rocephin has been started. Only one antibiotic has been started

## 2012-03-01 NOTE — ED Provider Notes (Signed)
History     CSN: [id number]  Arrival date & time 03/01/12  0865   First MD Initiated Contact with Patient 03/01/12 [id number]      Chief Complaint  Patient presents with  . Weakness  . Nausea  . Emesis    (Consider location/radiation/quality/duration/timing/severity/associated sxs/prior treatment) HPI Comments: Jocelyn Sanchez 56 y.o. female   The chief complaint is: Patient presents with:   Weakness   Nausea   Emesis   The patient has medical history significant for:   Past Medical History:   MS (multiple sclerosis)                                      HTN (hypertension)                                           DM II (diabetes mellitus, type II), controlled               Lymphedema                                                  Patient presents with fever, and vomiting X 8 times. Associated symptom include left sided abdominal pain 8/10 during EMS ride no longer present. Patient reports recent sick contact with her brother who also had abdominal complaints. Patient was recently admitted in July for treatment of lower extremity cellulitis. Patient denies chills or diaphoresis. Denies nausea, diarrhea, hematemesis, hematochezia or melena. Denies cough, congestion, SOB. Denies CP or palpitations. Denies dysuria, urgency, or frequency.      The history is provided by the patient.    Past Medical History  Diagnosis Date  . MS (multiple sclerosis)   . HTN (hypertension)   . DM II (diabetes mellitus, type II), controlled   . Lymphedema     History reviewed. No pertinent past surgical history.  Family History  Problem Relation Age of Onset  . Multiple sclerosis Mother     History  Substance Use Topics  . 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 Smokeless tobacco: Not on file  . Alcohol Use: No    OB History    Grav Para Term Preterm Abortions TAB SAB Ect Mult Living                  Review of Systems  Constitutional: Positive for fever. Negative for chills and  diaphoresis.  Respiratory: Negative for shortness of breath.   Cardiovascular: Positive for leg swelling. Negative for chest pain and palpitations.  Gastrointestinal: Positive for vomiting and abdominal pain. Negative for nausea, diarrhea and blood in stool.  Genitourinary: Negative for dysuria, urgency and frequency.  All other systems reviewed and are negative.    Allergies  Sulfa antibiotics  Home Medications   Current Outpatient Rx  Name Route Sig Dispense Refill  . BACLOFEN 10 MG PO TABS Oral Take 0.5 tablets (5 mg total) by mouth 3 (three) times daily. 30 each   . DIPHENHYDRAMINE-APAP (SLEEP) 25-500 MG PO TABS Oral Take 1-2 tablets by mouth at bedtime as needed. For pain.    Marland Kitchen HYDROCODONE-ACETAMINOPHEN 5-325 MG PO TABS Oral Take 1 tablet by mouth every  6 (six) hours as needed. For pain.    . INTERFERON BETA-1A 30 MCG/0.5ML IM KIT Intramuscular Inject 30 mcg into the muscle every 7 (seven) days. Taken on Fridays.    Marland Kitchen OLMESARTAN MEDOXOMIL-HCTZ 20-12.5 MG PO TABS Oral Take 1 tablet by mouth daily.      BP 115/89  Pulse 140  Temp 100.8 F (38.2 C) (Rectal)  Resp 18  SpO2 98%  Physical Exam  Nursing note and vitals reviewed. Constitutional: She appears well-developed and well-nourished. No distress.  HENT:  Head: Normocephalic and atraumatic.  Mouth/Throat: Oropharynx is clear and moist.  Eyes: Conjunctivae normal and EOM are normal. No scleral icterus.  Neck: Normal range of motion.  Cardiovascular: Normal rate, regular rhythm and normal heart sounds.        Warmth and no pitting edema of the lower extremities, Pulses difficult to appreciate.  Pulmonary/Chest: Effort normal and breath sounds normal. She has no wheezes.  Abdominal: Soft. Bowel sounds are normal. There is no tenderness.  Lymphadenopathy:    She has no cervical adenopathy.  Neurological: She is alert.  Skin: Skin is warm, dry and intact.       ED Course  Procedures (including critical care  time)  Labs Reviewed  GLUCOSE, CAPILLARY - Abnormal; Notable for the following:    Glucose-Capillary 166 (*)     All other components within normal limits  BASIC METABOLIC PANEL  CBC  URINALYSIS, ROUTINE W REFLEX MICROSCOPIC   Results for orders placed during the hospital encounter of 03/01/12  CBC      Component Value Range   WBC 12.5 (*) 4.0 - 10.5 K/uL   RBC 4.48  3.87 - 5.11 MIL/uL   Hemoglobin 11.4 (*) 12.0 - 15.0 g/dL   HCT 40.9 (*) 81.1 - 91.4 %   MCV 78.1  78.0 - 100.0 fL   MCH 25.4 (*) 26.0 - 34.0 pg   MCHC 32.6  30.0 - 36.0 g/dL   RDW 78.2  95.6 - 21.3 %   Platelets 525 (*) 150 - 400 K/uL  GLUCOSE, CAPILLARY      Component Value Range   Glucose-Capillary 166 (*) 70 - 99 mg/dL  URINALYSIS, ROUTINE W REFLEX MICROSCOPIC      Component Value Range   Color, Urine YELLOW  YELLOW   APPearance CLOUDY (*) CLEAR   Specific Gravity, Urine 1.021  1.005 - 1.030   pH 6.0  5.0 - 8.0   Glucose, UA NEGATIVE  NEGATIVE mg/dL   Hgb urine dipstick NEGATIVE  NEGATIVE   Bilirubin Urine NEGATIVE  NEGATIVE   Ketones, ur 15 (*) NEGATIVE mg/dL   Protein, ur NEGATIVE  NEGATIVE mg/dL   Urobilinogen, UA 0.2  0.0 - 1.0 mg/dL   Nitrite POSITIVE (*) NEGATIVE   Leukocytes, UA SMALL (*) NEGATIVE  OCCULT BLOOD, POC DEVICE      Component Value Range   Fecal Occult Bld NEGATIVE    LACTIC ACID, PLASMA      Component Value Range   Lactic Acid, Venous 3.2 (*) 0.5 - 2.2 mmol/L  URINE MICROSCOPIC-ADD ON      Component Value Range   Squamous Epithelial / LPF RARE  RARE   WBC, UA 3-6  <3 WBC/hpf   Bacteria, UA MANY (*) RARE   Urine-Other MUCOUS PRESENT      Date: 03/01/2012  Rate: 125  Rhythm: sinus tachycardia  QRS Axis: left  Intervals: normal  ST/T Wave abnormalities: normal  Conduction Disutrbances:none  Narrative Interpretation: No STEMI  Old EKG Reviewed: unchanged    No results found.   1. Fever   2. UTI (lower urinary tract infection)   3. Multiple sclerosis    CRITICAL  CARE Performed by: Pixie Casino   Total critical care time: 35 minutes  Critical care time was exclusive of separately billable procedures and treating other patients.  Critical care was necessary to treat or prevent imminent or life-threatening deterioration.  Critical care was time spent personally by me on the following activities: development of treatment plan with patient and/or surrogate as well as nursing, discussions with consultants, evaluation of patient's response to treatment, examination of patient, obtaining history from patient or surrogate, ordering and performing treatments and interventions, ordering and review of laboratory studies, ordering and review of radiographic studies, pulse oximetry and re-evaluation of patient's condition.    MDM  Patient presented with vomiting x 8 and fever. Patient give tylenol for fever, will reassess. CBC: leukocytosis UA: positive for nitrite and leukocyte, consistent with UTI. Lactic acid: elevated at 3.2 Occult blood: negative.Glucose hyperglycemia. Patient given fluids and started on IV Rocephin to address UTI. Patient meets SIRS criteria due to leukocytosis and tachycardia (HR greater than 90) in addition to elevated lactate and pro calcitonin at 16.55. Dr. Estell Harpin will contact the Dr. Algie Coffer her Cardiologist and he will handle admission.        Pixie Casino, PA-C 03/01/12 1159

## 2012-03-01 NOTE — ED Notes (Signed)
Patient's clothes, diaper,sheets, and hoyer lift pad saturated with urine and feces. Patient states that a CNA comes 4 hours a day. When asked about hoyer lift pad and who gets her up and puts her back to bed she states " I just sleep in the chair".

## 2012-03-01 NOTE — H&P (Signed)
Jocelyn Sanchez is an 56 y.o. female.   Chief Complaint: Weakness, nausea and fever. HPI: 56 years old female with fever, nausea and vomiting x 8. Also has UTI without symptoms. No chest pain or fever or abdominal pain. Chronic bilateral lower leg edema from limited mobility from MS.  Past Medical History  Diagnosis Date  . MS (multiple sclerosis)   . HTN (hypertension)   . DM II (diabetes mellitus, type II), controlled   . Lymphedema       History reviewed. No pertinent past surgical history.  Family History  Problem Relation Age of Onset  . Multiple sclerosis Mother    Social History:  reports that she has quit smoking. Her smoking use included Cigarettes. She has never used smokeless tobacco. She reports that she does not drink alcohol or use illicit drugs.  Allergies:  Allergies  Allergen Reactions  . Sulfa Antibiotics Shortness Of Breath    Medications Prior to Admission  Medication Sig Dispense Refill  . baclofen (LIORESAL) 10 MG tablet Take 0.5 tablets (5 mg total) by mouth 3 (three) times daily.  30 each    . diphenhydramine-acetaminophen (TYLENOL PM) 25-500 MG TABS Take 1-2 tablets by mouth at bedtime as needed. For pain.      Marland Kitchen HYDROcodone-acetaminophen (NORCO/VICODIN) 5-325 MG per tablet Take 1 tablet by mouth every 6 (six) hours as needed. For pain.      Marland Kitchen interferon beta-1a (AVONEX) 30 MCG/0.5ML injection Inject 30 mcg into the muscle every 7 (seven) days. Taken on Fridays.      Marland Kitchen olmesartan-hydrochlorothiazide (BENICAR HCT) 20-12.5 MG per tablet Take 1 tablet by mouth daily.        Results for orders placed during the hospital encounter of 03/01/12 (from the past 48 hour(s))  GLUCOSE, CAPILLARY     Status: Abnormal   Collection Time   03/01/12  9:41 AM      Component Value Range Comment   Glucose-Capillary 166 (*) 70 - 99 mg/dL   URINALYSIS, ROUTINE W REFLEX MICROSCOPIC     Status: Abnormal   Collection Time   03/01/12  9:57 AM      Component Value Range  Comment   Color, Urine YELLOW  YELLOW    APPearance CLOUDY (*) CLEAR    Specific Gravity, Urine 1.021  1.005 - 1.030    pH 6.0  5.0 - 8.0    Glucose, UA NEGATIVE  NEGATIVE mg/dL    Hgb urine dipstick NEGATIVE  NEGATIVE    Bilirubin Urine NEGATIVE  NEGATIVE    Ketones, ur 15 (*) NEGATIVE mg/dL    Protein, ur NEGATIVE  NEGATIVE mg/dL    Urobilinogen, UA 0.2  0.0 - 1.0 mg/dL    Nitrite POSITIVE (*) NEGATIVE    Leukocytes, UA SMALL (*) NEGATIVE   URINE MICROSCOPIC-ADD ON     Status: Abnormal   Collection Time   03/01/12  9:57 AM      Component Value Range Comment   Squamous Epithelial / LPF RARE  RARE    WBC, UA 3-6  <3 WBC/hpf    Bacteria, UA MANY (*) RARE    Urine-Other MUCOUS PRESENT     BASIC METABOLIC PANEL     Status: Abnormal   Collection Time   03/01/12 10:10 AM      Component Value Range Comment   Sodium 133 (*) 135 - 145 mEq/L    Potassium 3.6  3.5 - 5.1 mEq/L    Chloride 94 (*) 96 - 112 mEq/L  CO2 22  19 - 32 mEq/L    Glucose, Bld 192 (*) 70 - 99 mg/dL    BUN 14  6 - 23 mg/dL    Creatinine, Ser 1.61 (*) 0.50 - 1.10 mg/dL    Calcium 9.5  8.4 - 09.6 mg/dL    GFR calc non Af Amer >90  >90 mL/min    GFR calc Af Amer >90  >90 mL/min   CBC     Status: Abnormal   Collection Time   03/01/12 10:10 AM      Component Value Range Comment   WBC 12.5 (*) 4.0 - 10.5 K/uL    RBC 4.48  3.87 - 5.11 MIL/uL    Hemoglobin 11.4 (*) 12.0 - 15.0 g/dL    HCT 04.5 (*) 40.9 - 46.0 %    MCV 78.1  78.0 - 100.0 fL    MCH 25.4 (*) 26.0 - 34.0 pg    MCHC 32.6  30.0 - 36.0 g/dL    RDW 81.1  91.4 - 78.2 %    Platelets 525 (*) 150 - 400 K/uL   LACTIC ACID, PLASMA     Status: Abnormal   Collection Time   03/01/12 10:10 AM      Component Value Range Comment   Lactic Acid, Venous 3.2 (*) 0.5 - 2.2 mmol/L   PROCALCITONIN     Status: Normal   Collection Time   03/01/12 10:10 AM      Component Value Range Comment   Procalcitonin 16.55     HEPATIC FUNCTION PANEL     Status: Normal   Collection  Time   03/01/12 10:10 AM      Component Value Range Comment   Total Protein 8.1  6.0 - 8.3 g/dL    Albumin 3.6  3.5 - 5.2 g/dL    AST 17  0 - 37 U/L    ALT 10  0 - 35 U/L    Alkaline Phosphatase 60  39 - 117 U/L    Total Bilirubin 0.6  0.3 - 1.2 mg/dL    Bilirubin, Direct 0.1  0.0 - 0.3 mg/dL    Indirect Bilirubin 0.5  0.3 - 0.9 mg/dL   OCCULT BLOOD, POC DEVICE     Status: Normal   Collection Time   03/01/12 10:32 AM      Component Value Range Comment   Fecal Occult Bld NEGATIVE      No results found.  @ROS @ Constitutional: Positive for fever. Negative for chills and diaphoresis.  Respiratory: Negative for shortness of breath.  Cardiovascular: Positive for leg swelling. Negative for chest pain and palpitations.  Gastrointestinal: Positive for vomiting and abdominal pain. Negative for nausea, diarrhea and blood in stool.  Genitourinary: Negative for dysuria, urgency and frequency.  Neurology: + MS with chronic lower extremity weakness.  Blood pressure 105/54, pulse 124, temperature 98.8 F (37.1 C), temperature source Oral, resp. rate 18, SpO2 100.00%.  Constitutional: She appears well-developed and well-nourished. No distress.  HENT: Normocephalic and atraumatic., [employer], PERL, EOMI. No icterus. Throat-Clear. Neck: Normal range of motion.  Cardiovascular: Normal rate, regular rhythm and normal heart sounds.  Ext: Warmth and no pitting edema of the lower extremities, Pulses difficult to appreciate.  Pulmonary/Chest: Effort normal and breath sounds normal. She has no wheezes.  Abdominal: Soft. Bowel sounds are normal. There is no tenderness.  Lymphadenopathy: She has no cervical adenopathy.  Neurological: She is alert. Bil. Lower extremity weakness. Skin: Skin is warm, dry and intact.  Assessment/Plan Fever UTI Multiple sclerosis with weakness Chronic lymphedema of both lower ext.  IV antibiotic Elevate lower legs  Jocelyn Sanchez S 03/01/2012, 2:08 PM

## 2012-03-01 NOTE — Progress Notes (Signed)
Utilization review completed.  

## 2012-03-01 NOTE — ED Notes (Signed)
ZOX:[id number]<AV> Expected date:03/01/12<BR> Expected time: 9:05 AM<BR> Means of arrival:<BR> Comments:<BR> 60 female home n/v

## 2012-03-02 LAB — CBC
HCT: 34.2 % — ABNORMAL LOW (ref 36.0–46.0)
Hemoglobin: 11 g/dL — ABNORMAL LOW (ref 12.0–15.0)
MCH: 25.3 pg — ABNORMAL LOW (ref 26.0–34.0)
MCHC: 32.2 g/dL (ref 30.0–36.0)

## 2012-03-02 LAB — BASIC METABOLIC PANEL
BUN: 9 mg/dL (ref 6–23)
CO2: 21 mEq/L (ref 19–32)
Chloride: 95 mEq/L — ABNORMAL LOW (ref 96–112)
Glucose, Bld: 70 mg/dL (ref 70–99)
Potassium: 3.8 mEq/L (ref 3.5–5.1)

## 2012-03-02 MED ORDER — METOPROLOL TARTRATE 25 MG PO TABS
25.0000 mg | ORAL_TABLET | Freq: Two times a day (BID) | ORAL | Status: DC
  Administered 2012-03-03 – 2012-03-11 (×16): 25 mg via ORAL
  Filled 2012-03-02 (×20): qty 1

## 2012-03-02 MED ORDER — PIPERACILLIN-TAZOBACTAM 3.375 G IVPB
3.3750 g | Freq: Three times a day (TID) | INTRAVENOUS | Status: DC
  Administered 2012-03-02 – 2012-03-11 (×28): 3.375 g via INTRAVENOUS
  Filled 2012-03-02 (×29): qty 50

## 2012-03-02 MED ORDER — VANCOMYCIN HCL IN DEXTROSE 1-5 GM/200ML-% IV SOLN
1000.0000 mg | Freq: Two times a day (BID) | INTRAVENOUS | Status: DC
  Administered 2012-03-02 – 2012-03-04 (×4): 1000 mg via INTRAVENOUS
  Filled 2012-03-02 (×5): qty 200

## 2012-03-02 NOTE — Progress Notes (Signed)
Subjective:  Not feeling well. No chest pain. Some abdominal discomfort and fever with elevated white count.  Objective:  Vital Signs in the last 24 hours: Temp:  [98.8 F (37.1 C)-102 F (38.9 C)] 101.6 F (38.7 C) (09/14 0600) Pulse Rate:  [124-130] 129  (09/14 0600) Cardiac Rhythm:  [-] Sinus tachycardia (09/14 0745) Resp:  [18] 18  (09/14 0600) BP: (105-125)/(54-85) 124/56 mmHg (09/14 0600) SpO2:  [100 %] 100 % (09/14 0600)  Physical Exam: BP Readings from Last 1 Encounters:  03/02/12 124/56    Wt Readings from Last 1 Encounters:  12/17/11 81.2 kg (179 lb 0.2 oz)    Weight change:   HEENT: [place]/AT, Eyes-Brown, PERL, EOMI, Conjunctiva-Pink, Sclera-Non-icteric Neck: No JVD, No bruit, Trachea midline. Lungs:  Clear, Bilateral. Cardiac:  Regular rhythm, normal S1 and S2, no S3.  Abdomen:  Soft, mild tenderness. Extremities:  1 + edema present. No cyanosis. No clubbing. CNS: AxOx3, Cranial nerves grossly intact. Lower extremity weakness. Right handed. Skin: Warm and dry.   Intake/Output from previous day: 09/13 0701 - 09/14 0700 In: 1415 [I.V.:1415] Out: -     Lab Results: BMET    Component Value Date/Time   NA 132* 03/02/2012 0542   K 3.8 03/02/2012 0542   CL 95* 03/02/2012 0542   CO2 21 03/02/2012 0542   GLUCOSE 70 03/02/2012 0542   BUN 9 03/02/2012 0542   CREATININE 0.37* 03/02/2012 0542   CALCIUM 9.2 03/02/2012 0542   GFRNONAA >90 03/02/2012 0542   GFRAA >90 03/02/2012 0542   CBC    Component Value Date/Time   WBC 21.3* 03/02/2012 0542   RBC 4.34 03/02/2012 0542   HGB 11.0* 03/02/2012 0542   HCT 34.2* 03/02/2012 0542   PLT 465* 03/02/2012 0542   MCV 78.8 03/02/2012 0542   MCH 25.3* 03/02/2012 0542   MCHC 32.2 03/02/2012 0542   RDW 14.7 03/02/2012 0542   LYMPHSABS 0.7 12/17/2011 1639   MONOABS 0.4 12/17/2011 1639   EOSABS 0.0 12/17/2011 1639   BASOSABS 0.0 12/17/2011 1639   CARDIAC ENZYMES No results found for this basename: CKTOTAL, CKMB, CKMBINDEX, TROPONINI     Assessment/Plan:  Patient Active Hospital Problem List: Fever  UTI  Multiple sclerosis with weakness  Chronic lymphedema of both lower ext.  Change antibiotics for abdominal and lower ext infections   LOS: 1 day    Orpah Cobb  MD  03/02/2012, 11:57 AM

## 2012-03-02 NOTE — Progress Notes (Signed)
ANTIBIOTIC CONSULT NOTE - INITIAL  Pharmacy Consult for Vancomycin/Zosyn Indication: LE cellulitis  Allergies  Allergen Reactions  . Sulfa Antibiotics Shortness Of Breath    Patient Measurements:   Body Weight as of 12/17/11: 81.2 kg  Vital Signs: Temp: 101.6 F (38.7 C) (09/14 0600) Temp src: Oral (09/14 0600) BP: 124/56 mmHg (09/14 0600) Pulse Rate: 129  (09/14 0600) Intake/Output from previous day: 09/13 0701 - 09/14 0700 In: 1415 [I.V.:1415] Out: -  Intake/Output from this shift: Total I/O In: 120 [P.O.:120] Out: -   Labs:  Basename 03/02/12 0542 03/01/12 1010  WBC 21.3* 12.5*  HGB 11.0* 11.4*  PLT 465* 525*  LABCREA -- --  CREATININE 0.37* 0.46*     Microbiology: Recent Results (from the past 720 hour(s))  CULTURE, BLOOD (ROUTINE X 2)     Status: Normal (Preliminary result)   Collection Time   03/01/12 10:10 AM      Component Value Range Status Comment   Specimen Description BLOOD RIGHT ANTECUBITAL   Final    Special Requests BOTTLES DRAWN AEROBIC AND ANAEROBIC   Final    Culture  Setup Time 03/01/2012 13:24   Final    Culture     Final    Value:        BLOOD CULTURE RECEIVED NO GROWTH TO DATE CULTURE WILL BE HELD FOR 5 DAYS BEFORE ISSUING A FINAL NEGATIVE REPORT   Report Status PENDING   Incomplete   CULTURE, BLOOD (ROUTINE X 2)     Status: Normal (Preliminary result)   Collection Time   03/01/12 11:25 AM      Component Value Range Status Comment   Specimen Description BLOOD LEFT HAND  1.5 ML IN AEROBIC ONLY   Final    Special Requests Immunocompromised   Final    Culture  Setup Time 03/01/2012 23:48   Final    Culture     Final    Value:        BLOOD CULTURE RECEIVED NO GROWTH TO DATE CULTURE WILL BE HELD FOR 5 DAYS BEFORE ISSUING A FINAL NEGATIVE REPORT   Report Status PENDING   Incomplete     Medical History: Past Medical History  Diagnosis Date  . MS (multiple sclerosis)   . HTN (hypertension)   . DM II (diabetes mellitus, type II),  controlled   . Lymphedema     Assessment: 62 YOF with lower leg cellulitis, leukocytosis, and fever to begin empiric antibiotics with vancomycin and zosyn. Estimated CrCl~100 ml/min; however, difficult to determine given low SCr (d/t limited mobility from MS). Blood cultures pending.  Patient has received 2 days of ceftriaxone.  Goal of Therapy:  Vancomycin trough level 10-15 mcg/ml  Plan:  Vancomycin 1g IV q12h. Zosyn 3.375g IV q8h (4 hour infusion time). Will f/u vanc trough at steady state and culture results.   Clance Boll 03/02/2012,12:08 PM

## 2012-03-03 LAB — BASIC METABOLIC PANEL
CO2: 27 mEq/L (ref 19–32)
Calcium: 8.6 mg/dL (ref 8.4–10.5)
Creatinine, Ser: 0.38 mg/dL — ABNORMAL LOW (ref 0.50–1.10)

## 2012-03-03 LAB — CBC
MCH: 25.6 pg — ABNORMAL LOW (ref 26.0–34.0)
MCV: 77.4 fL — ABNORMAL LOW (ref 78.0–100.0)
Platelets: 366 10*3/uL (ref 150–400)
RBC: 3.71 MIL/uL — ABNORMAL LOW (ref 3.87–5.11)

## 2012-03-03 MED ORDER — POTASSIUM CHLORIDE CRYS ER 20 MEQ PO TBCR
40.0000 meq | EXTENDED_RELEASE_TABLET | Freq: Two times a day (BID) | ORAL | Status: DC
  Administered 2012-03-03 – 2012-03-04 (×4): 40 meq via ORAL
  Filled 2012-03-03 (×6): qty 2

## 2012-03-03 NOTE — Progress Notes (Signed)
Subjective:  Patient reports she's feeling better. She denies chest pains abdominal pain or shortness of breath. Denies any dysuria. She was seen by wound care nurse regarding her chronic lower extremity edema. No other new complaints. Appetite is good.   Allergies  Allergen Reactions  . Sulfa Antibiotics Shortness Of Breath   Current Facility-Administered Medications  Medication Dose Route Frequency Provider Last Rate Last Dose  . acetaminophen (TYLENOL) tablet 650 mg  650 mg Oral Q6H PRN Ricki Rodriguez, MD   650 mg at 03/03/12 1735  . baclofen (LIORESAL) tablet 5 mg  5 mg Oral TID Ricki Rodriguez, MD   5 mg at 03/03/12 1735  . diphenhydrAMINE (BENADRYL) capsule 25-50 mg  25-50 mg Oral QHS PRN Benny Lennert, MD      . heparin injection 5,000 Units  5,000 Units Subcutaneous Q8H Ricki Rodriguez, MD   5,000 Units at 03/03/12 1358  . HYDROcodone-acetaminophen (NORCO/VICODIN) 5-325 MG per tablet 1 tablet  1 tablet Oral Q6H PRN Ricki Rodriguez, MD   1 tablet at 03/03/12 1006  . metoprolol tartrate (LOPRESSOR) tablet 25 mg  25 mg Oral BID Ricki Rodriguez, MD   25 mg at 03/03/12 0959  . ondansetron (ZOFRAN) tablet 4 mg  4 mg Oral Q6H PRN Ricki Rodriguez, MD       Or  . ondansetron (ZOFRAN) injection 4 mg  4 mg Intravenous Q6H PRN Ricki Rodriguez, MD      . piperacillin-tazobactam (ZOSYN) IVPB 3.375 g  3.375 g Intravenous Q8H Maryanna Shape Runyon, PHARMD   3.375 g at 03/03/12 1358  . potassium chloride (K-DUR,KLOR-CON) CR tablet 10 mEq  10 mEq Oral BID Ricki Rodriguez, MD   10 mEq at 03/02/12 2143  . potassium chloride SA (K-DUR,KLOR-CON) CR tablet 40 mEq  40 mEq Oral BID Ricki Rodriguez, MD   40 mEq at 03/03/12 1226  . sodium chloride 0.9 % injection 3 mL  3 mL Intravenous Q12H Ricki Rodriguez, MD   3 mL at 03/01/12 2121  . vancomycin (VANCOCIN) IVPB 1000 mg/200 mL premix  1,000 mg Intravenous Q12H Maryanna Shape Runyon, PHARMD   1,000 mg at 03/03/12 1210    Objective: Blood pressure 118/70, pulse 101,  temperature 100 F (37.8 C), temperature source Oral, resp. rate 20, height 5\' 3"  (1.6 m), weight 165 lb (74.844 kg), SpO2 93.00%.  Patient resting comfortably in no acute distress. HEENT: No sinus tenderness. NECK: No posterior cervical nodes. LUNGS: Clear with out wheezes. Decreased breath sounds at bases. CV: Normal S1, S2 without S3. ABD: Soft nontender. MSK: Lower extremity edema. Wound care is noted. NEURO: Diffuse weakness with difficulty in raising up for lung exam. Weakness in lower extremities as well. Baseline per patient.  Lab results: Results for orders placed during the hospital encounter of 03/01/12 (from the past 48 hour(s))  BASIC METABOLIC PANEL     Status: Abnormal   Collection Time   03/02/12  5:42 AM      Component Value Range Comment   Sodium 132 (*) 135 - 145 mEq/L    Potassium 3.8  3.5 - 5.1 mEq/L    Chloride 95 (*) 96 - 112 mEq/L    CO2 21  19 - 32 mEq/L    Glucose, Bld 70  70 - 99 mg/dL    BUN 9  6 - 23 mg/dL    Creatinine, Ser 6.57 (*) 0.50 - 1.10 mg/dL    Calcium 9.2  8.4 -  10.5 mg/dL    GFR calc non Af Amer >90  >90 mL/min    GFR calc Af Amer >90  >90 mL/min   CBC     Status: Abnormal   Collection Time   03/02/12  5:42 AM      Component Value Range Comment   WBC 21.3 (*) 4.0 - 10.5 K/uL    RBC 4.34  3.87 - 5.11 MIL/uL    Hemoglobin 11.0 (*) 12.0 - 15.0 g/dL    HCT 40.9 (*) 81.1 - 46.0 %    MCV 78.8  78.0 - 100.0 fL    MCH 25.3 (*) 26.0 - 34.0 pg    MCHC 32.2  30.0 - 36.0 g/dL    RDW 91.4  78.2 - 95.6 %    Platelets 465 (*) 150 - 400 K/uL   CBC     Status: Abnormal   Collection Time   03/03/12  9:55 AM      Component Value Range Comment   WBC 19.6 (*) 4.0 - 10.5 K/uL    RBC 3.71 (*) 3.87 - 5.11 MIL/uL    Hemoglobin 9.5 (*) 12.0 - 15.0 g/dL    HCT 21.3 (*) 08.6 - 46.0 %    MCV 77.4 (*) 78.0 - 100.0 fL    MCH 25.6 (*) 26.0 - 34.0 pg    MCHC 33.1  30.0 - 36.0 g/dL    RDW 57.8  46.9 - 62.9 %    Platelets 366  150 - 400 K/uL   BASIC METABOLIC  PANEL     Status: Abnormal   Collection Time   03/03/12  9:55 AM      Component Value Range Comment   Sodium 130 (*) 135 - 145 mEq/L    Potassium 3.3 (*) 3.5 - 5.1 mEq/L    Chloride 94 (*) 96 - 112 mEq/L    CO2 27  19 - 32 mEq/L    Glucose, Bld 191 (*) 70 - 99 mg/dL    BUN 7  6 - 23 mg/dL    Creatinine, Ser 5.28 (*) 0.50 - 1.10 mg/dL    Calcium 8.6  8.4 - 41.3 mg/dL    GFR calc non Af Amer >90  >90 mL/min    GFR calc Af Amer >90  >90 mL/min     Studies/Results: No results found.  Patient Active Problem List  Diagnosis  . Hypokalemia  . SIRS (systemic inflammatory response syndrome)  . Multiple sclerosis  . Diabetes mellitus  . Hypertension  . Lymphedema  . PNA (pneumonia)  . Decubitus ulcers  . Bilateral lower leg cellulitis    Impression: Multiple sclerosis. Hypokalemia, symptomatic. Leukocytosis. UTI, rule out. Patient on antibiotics. Chronic lymphedema. Transient fever.    Plan: Continue present therapy. Replenish electrolytes. Followup CBC, CMET in a.m.   August Saucer, Kiffany Schelling 03/03/2012 5:43 PM  A

## 2012-03-03 NOTE — Consult Note (Signed)
WOC consult Note Reason for Consult: eval LE wounds.  Pt with LE bilateral lymphedema, she reports she has had this problem with her LE for many years.  She will have areas that open from time to time and heal. She wraps her legs at home with gauze and ACE wraps when that happens.  She does not have any open areas on the right leg at this time.  She does have brawny edema of both of her legs.  Wound type: open ulcer associated with lymphedema of the left lower extremity. Measurement:2.5cm x 1.5cm x 0.5cm  Wound bed: dark ruddy and moist Drainage (amount, consistency, odor) minimal Periwound: brawny edema around the wound bed.  Dressing procedure/placement/frequency: will order silver hydrofiber cut to fit into the wound, cover with dry dressing.  Would recommend this dressing to be changed every 3 days.  Will absorb exudate and manage bioburdan of the wound bed.   Re consult if needed, will not follow at this time. Thanks  Jenaya Saar [employer], CWOCN [phone])

## 2012-03-04 MED ORDER — SODIUM CHLORIDE 0.9 % IV SOLN
750.0000 mg | Freq: Two times a day (BID) | INTRAVENOUS | Status: DC
  Administered 2012-03-05: 750 mg via INTRAVENOUS
  Filled 2012-03-04 (×2): qty 750

## 2012-03-04 MED ORDER — SODIUM CHLORIDE 0.9 % IV SOLN
750.0000 mg | Freq: Once | INTRAVENOUS | Status: AC
  Administered 2012-03-04: 750 mg via INTRAVENOUS
  Filled 2012-03-04: qty 750

## 2012-03-04 NOTE — Clinical Documentation Improvement (Signed)
Abnormal Labs Clarification  THIS DOCUMENT IS NOT A PERMANENT PART OF THE MEDICAL RECORD  TO RESPOND TO THE THIS QUERY, FOLLOW THE INSTRUCTIONS BELOW:  1. If needed, update documentation for the patient's encounter via the notes activity.  2. Access this query again and click edit on the [employer].  3. After updating, or not, click F2 to complete all highlighted (required) fields concerning your review. Select "additional documentation in the medical record" OR "no additional documentation provided".  4. Click Sign note button.  5. The deficiency will fall out of your InBasket *Please let us know if you are not able to complete this workflow by phone or e-mail (listed below).  Please update your documentation within the medical record to reflect your response to this query.                                                                                   03/04/12  Dear Dr. Mervyn Skeeters. [facility]  In a better effort to capture your patient's severity of illness, reflect appropriate length of stay and utilization of resources, a review of the medical record has revealed the following indicators.    Based on your clinical judgment, please clarify and document in a progress note and/or discharge summary the clinical condition associated with the following supporting information:  In responding to this query please exercise your independent judgment.  The fact that a query is asked, does not imply that any particular answer is desired or expected.  Abnormal findings (laboratory, x-ray, pathologic, and other diagnostic results) are not coded and reported unless the physician indicates their clinical significance.   The medical record reflects the following clinical findings, please clarify the diagnostic and/or clinical significance:      Noted patient's Sodium level 130(9/15), 132 (9/14), 133(9/13) and 133 (7/6), if possible please document secondary diagnosis if appropriate.  Thank you     Possible Clinical Conditions?   Hyponatremia  Hyposmolality  SIADH                                  Other Condition                Cannot Clinically Determine   Evaluation BMET daily   Reviewed: additional documentation in the medical record   Thank You,  Leonette Most Addison  Clinical Documentation Specialist RN, BSN: Pager [phone] HIM Off # [phone]  [facility] Information [facility] [place]

## 2012-03-04 NOTE — Progress Notes (Signed)
Patient refused dressing change at this time states she is in too much pain was given two Tylenol will check back with patient later to reassess pain and attempt to dress site.

## 2012-03-04 NOTE — ED Provider Notes (Signed)
Medical screening examination/treatment/procedure(s) were conducted as a shared visit with non-physician practitioner(s) and myself.  I personally evaluated the patient during the encounter   Benny Lennert, MD 03/04/12 1118

## 2012-03-04 NOTE — Progress Notes (Signed)
Subjective:  Feeling better. T max 100.4 F. Refused blood work today.  Objective:  Vital Signs in the last 24 hours: Temp:  [98.5 F (36.9 C)-100.4 F (38 C)] 98.5 F (36.9 C) (09/16 0600) Pulse Rate:  [94-102] 94  (09/16 0600) Cardiac Rhythm:  [-]  Resp:  [18-20] 18  (09/16 0600) BP: (106-128)/(70-83) 128/83 mmHg (09/16 0600) SpO2:  [93 %-97 %] 96 % (09/16 0600)  Physical Exam: BP Readings from Last 1 Encounters:  03/04/12 128/83    Wt Readings from Last 1 Encounters:  03/03/12 74.844 kg (165 lb)    Weight change:   HEENT: [place]/AT, Eyes-Brown, PERL, EOMI, Conjunctiva-Pale pink, Sclera-Non-icteric Neck: No JVD, No bruit, Trachea midline. Lungs:  Clear, Bilateral. Cardiac:  Regular rhythm, normal S1 and S2, no S3.  Abdomen:  Soft, non-tender. Extremities: 1 +  edema present. No cyanosis. No clubbing. Small healing ulcers on left lower leg. CNS: AxOx3, Cranial nerves grossly intact, Bilateral lower extremity weakness. Right handed. Skin: Warm and dry.   Intake/Output from previous day: 09/15 0701 - 09/16 0700 In: 1280 [P.O.:180; IV Piggyback:1100] Out: -     Lab Results: BMET    Component Value Date/Time   NA 130* 03/03/2012 0955   K 3.3* 03/03/2012 0955   CL 94* 03/03/2012 0955   CO2 27 03/03/2012 0955   GLUCOSE 191* 03/03/2012 0955   BUN 7 03/03/2012 0955   CREATININE 0.38* 03/03/2012 0955   CALCIUM 8.6 03/03/2012 0955   GFRNONAA >90 03/03/2012 0955   GFRAA >90 03/03/2012 0955   CBC    Component Value Date/Time   WBC 19.6* 03/03/2012 0955   RBC 3.71* 03/03/2012 0955   HGB 9.5* 03/03/2012 0955   HCT 28.7* 03/03/2012 0955   PLT 366 03/03/2012 0955   MCV 77.4* 03/03/2012 0955   MCH 25.6* 03/03/2012 0955   MCHC 33.1 03/03/2012 0955   RDW 14.7 03/03/2012 0955   LYMPHSABS 0.7 12/17/2011 1639   MONOABS 0.4 12/17/2011 1639   EOSABS 0.0 12/17/2011 1639   BASOSABS 0.0 12/17/2011 1639   CARDIAC ENZYMES No results found for this basename: CKTOTAL, CKMB, CKMBINDEX, TROPONINI     Assessment/Plan:  Patient Active Hospital Problem List: Fever  UTI  Multiple sclerosis with weakness  Chronic lymphedema of both lower ext.  Continue medical treatment.    LOS: 3 days    Orpah Cobb  MD  03/04/2012, 8:54 AM

## 2012-03-04 NOTE — Clinical Documentation Improvement (Signed)
POA DOCUMENTATION CLARIFICATION QUERY  THIS DOCUMENT IS NOT A PERMANENT PART OF THE MEDICAL RECORD  TO RESPOND TO THE THIS QUERY, FOLLOW THE INSTRUCTIONS BELOW:  1. If needed, update documentation for the patient's encounter via the notes activity.  2. Access this query again and click edit on the In [employer].  3. After updating, or not, click F2 to complete all highlighted (required) fields concerning your review. Select "additional documentation in the medical record" OR "no additional documentation provided".  4. Click Sign note button.  5. The deficiency will fall out of your In Basket *Please let us know if you are not able to complete this workflow by phone or e-mail (listed below).  Please update your documentation within the medical record to reflect your response to this query.                                                                                    03/04/12  Dear Dr. Thurnell Lose In a better effort to capture your patient's severity of illness, reflect appropriate length of stay and utilization of resources, a review of the patient medical record has revealed the following indicators.    Based on your clinical judgment, please clarify and document in a progress note and/or discharge summary the clinical condition associated with the following supporting information:  In responding to this query please exercise your independent judgment.  The fact that a query is asked, does not imply that any particular answer is desired or expected.  Medicare rules require specification as to whether an inpatient diagnosis was present at the time of admission.   Noted documentation of "PNA" on progress note 9/15 by covering physician .  Diagnosis not documented after that date. Is this a diagnosis under current treatment?  Thank you    Please clarify if the following diagnosis,             Pneumonia was:     _ Present at the time of admission  _ NOT present at the time of  inpatient admission and it developed during the inpatient stay  _ Unable to clinically determine whether the condition was present on admission.  _  Documentation insufficient to determine if condition was present at the time of inpatient admission  You may use possible, probable, or suspect with inpatient documentation. possible, probable, suspected diagnoses MUST be documented at the time of discharge  Reviewed: additional documentation in the medical record      Thank You,  Leonette Most Addison  Clinical Documentation Specialist RN, BSN: Pager [phone]  HIM Off # [phone]  Health Information Management [place]

## 2012-03-04 NOTE — Progress Notes (Signed)
ANTIBIOTIC CONSULT NOTE  Pharmacy Consult for Vancomycin/Zosyn Indication: LE cellulitis  Allergies  Allergen Reactions  . Sulfa Antibiotics Shortness Of Breath    Patient Measurements: Height: 5\' 3"  (160 cm) Weight: 165 lb (74.844 kg) IBW/kg (Calculated) : 52.4   Vital Signs: Temp: 98.5 F (36.9 C) (09/16 0600) Temp src: Oral (09/16 0600) BP: 128/83 mmHg (09/16 0600) Pulse Rate: 94  (09/16 0600) Intake/Output from previous day: 09/15 0701 - 09/16 0700 In: 1280 [P.O.:180; IV Piggyback:1100] Out: -   Labs:  Basename 03/03/12 0955 03/02/12 0542  WBC 19.6* 21.3*  HGB 9.5* 11.0*  PLT 366 465*  LABCREA -- --  CREATININE 0.38* 0.37*  Vancomycin trough level 03/04/12 1230pm = 19.7    Microbiology: Recent Results (from the past 720 hour(s))  CULTURE, BLOOD (ROUTINE X 2)     Status: Normal (Preliminary result)   Collection Time   03/01/12 10:10 AM      Component Value Range Status Comment   Specimen Description BLOOD RIGHT ANTECUBITAL   Final    Special Requests BOTTLES DRAWN AEROBIC AND ANAEROBIC   Final    Culture  Setup Time 03/01/2012 13:24   Final    Culture     Final    Value:        BLOOD CULTURE RECEIVED NO GROWTH TO DATE CULTURE WILL BE HELD FOR 5 DAYS BEFORE ISSUING A FINAL NEGATIVE REPORT   Report Status PENDING   Incomplete   CULTURE, BLOOD (ROUTINE X 2)     Status: Normal (Preliminary result)   Collection Time   03/01/12 11:25 AM      Component Value Range Status Comment   Specimen Description BLOOD LEFT HAND  1.5 ML IN AEROBIC ONLY   Final    Special Requests Immunocompromised   Final    Culture  Setup Time 03/01/2012 23:48   Final    Culture     Final    Value:        BLOOD CULTURE RECEIVED NO GROWTH TO DATE CULTURE WILL BE HELD FOR 5 DAYS BEFORE ISSUING A FINAL NEGATIVE REPORT   Report Status PENDING   Incomplete     Medical History: Past Medical History  Diagnosis Date  . MS (multiple sclerosis)   . HTN (hypertension)   . DM II (diabetes  mellitus, type II), controlled   . Lymphedema     Assessment:  53 YOF with lower leg cellulitis, leukocytosis, and fever, UTI  Day #3 of empiric antibiotics with vancomycin and zosyn.  Patient had already received 2 days of ceftriaxone.  SCr appears sufficient; however, difficult to determine given low SCr (d/t limited mobility from MS).  Patient refused labs today.  Blood cultures show no growth to date.   Vancomycin trough level therapeutic, but at high end of range.  Goal of Therapy:  Vancomycin trough level 10-15 mcg/ml  Plan:   Continue Zosyn 3.375g IV Q8H infused over 4hrs.  Decrease to Vancomycin 750mg  IV q12h.  Measure Vanc trough at steady state.  Follow up renal fxn and culture results.   Lynann Beaver PharmD, BCPS Pager [phone] 03/04/2012 12:25 PM

## 2012-03-05 LAB — CBC
HCT: 26 % — ABNORMAL LOW (ref 36.0–46.0)
Hemoglobin: 8.5 g/dL — ABNORMAL LOW (ref 12.0–15.0)
MCH: 25.1 pg — ABNORMAL LOW (ref 26.0–34.0)
MCHC: 32.7 g/dL (ref 30.0–36.0)
MCV: 76.9 fL — ABNORMAL LOW (ref 78.0–100.0)
RBC: 3.38 MIL/uL — ABNORMAL LOW (ref 3.87–5.11)

## 2012-03-05 LAB — BASIC METABOLIC PANEL
BUN: 19 mg/dL (ref 6–23)
CO2: 22 mEq/L (ref 19–32)
Calcium: 8.6 mg/dL (ref 8.4–10.5)
GFR calc non Af Amer: 33 mL/min — ABNORMAL LOW (ref >90)
Glucose, Bld: 126 mg/dL — ABNORMAL HIGH (ref 70–99)

## 2012-03-05 NOTE — Progress Notes (Signed)
Subjective:  Feeling better. Decreasing WBC count. Improving Na+ level. T max 100.1  Objective:  Vital Signs in the last 24 hours: Temp:  [99.2 F (37.3 C)-100.1 F (37.8 C)] 99.2 F (37.3 C) (09/17 0600) Pulse Rate:  [98-103] 98  (09/17 0600) Cardiac Rhythm:  [-] Normal sinus rhythm (09/16 2031) Resp:  [20] 20  (09/17 0600) BP: (108-135)/(70-82) 119/70 mmHg (09/17 0600) SpO2:  [99 %-100 %] 100 % (09/17 0600)  Physical Exam: BP Readings from Last 1 Encounters:  03/05/12 119/70    Wt Readings from Last 1 Encounters:  03/03/12 74.844 kg (165 lb)    Weight change:   HEENT: [place]/AT, Eyes-Brown, PERL, EOMI, Conjunctiva-Pale, Sclera-Non-icteric Neck: No JVD, No bruit, Trachea midline. Lungs:  Clear, Bilateral. Cardiac:  Regular rhythm, normal S1 and S2, no S3.  Abdomen:  Soft, non-tender. Extremities:  Trace edema present. No cyanosis. No clubbing. + small ulcers on left lower ext. CNS: AxOx3, Cranial nerves grossly intact, Right handed. Significant lower ext. Weakness. Skin: Warm and dry.   Intake/Output from previous day: 09/16 0701 - 09/17 0700 In: 360 [P.O.:360] Out: -     Lab Results: BMET    Component Value Date/Time   NA 134* 03/05/2012 0500   K 4.1 03/05/2012 0500   CL 101 03/05/2012 0500   CO2 22 03/05/2012 0500   GLUCOSE 126* 03/05/2012 0500   BUN 19 03/05/2012 0500   CREATININE 1.67* 03/05/2012 0500   CALCIUM 8.6 03/05/2012 0500   GFRNONAA 33* 03/05/2012 0500   GFRAA 38* 03/05/2012 0500   CBC    Component Value Date/Time   WBC 13.2* 03/05/2012 0500   RBC 3.38* 03/05/2012 0500   HGB 8.5* 03/05/2012 0500   HCT 26.0* 03/05/2012 0500   PLT 416* 03/05/2012 0500   MCV 76.9* 03/05/2012 0500   MCH 25.1* 03/05/2012 0500   MCHC 32.7 03/05/2012 0500   RDW 15.1 03/05/2012 0500   LYMPHSABS 0.7 12/17/2011 1639   MONOABS 0.4 12/17/2011 1639   EOSABS 0.0 12/17/2011 1639   BASOSABS 0.0 12/17/2011 1639   CARDIAC ENZYMES No results found for this basename: CKTOTAL, CKMB, CKMBINDEX,  TROPONINI    Assessment/Plan:  Patient Active Hospital Problem List: Fever  UTI  Multiple sclerosis with weakness  Chronic lymphedema of both lower ext. Hyponatremia-improving. Acute renal insufficiency.  DC K+ supplement. Pharmacy to adjust Vancomycin dose.   LOS: 4 days    Orpah Cobb  MD  03/05/2012, 8:20 AM

## 2012-03-05 NOTE — Progress Notes (Signed)
Patient refusing to get her labs drawn this evening.  Explained to patient the importance of getting the correct amount of antibiotic so that her infection would heal quicker and she can go home sooner.  Patient stated that her arms were sore and swollen and she didn't want to get stuck until the am.  Pharmacy made aware.

## 2012-03-05 NOTE — Progress Notes (Signed)
Orders for vancomycin level at 16:00 today cancelled as patient's hands swollen and tired of venipuctures.  She is agreeable to have it drawn with am labs, so change to random level in am,   Juliette Alcide, PharmD, BCPS.   Pager: [phone] 03/05/2012 5:39 PM

## 2012-03-05 NOTE — Progress Notes (Signed)
New tegasorb dressing applied to small stage 2 on sacrum.

## 2012-03-05 NOTE — Progress Notes (Signed)
ANTIBIOTIC CONSULT NOTE  Pharmacy Consult for Vancomycin/Zosyn Indication: LE cellulitis, UTI  Allergies  Allergen Reactions  . Sulfa Antibiotics Shortness Of Breath    Patient Measurements: Height: 5\' 3"  (160 cm) Weight: 165 lb (74.844 kg) IBW/kg (Calculated) : 52.4   Vital Signs: Temp: 99.2 F (37.3 C) (09/17 0600) Temp src: Oral (09/17 0600) BP: 119/70 mmHg (09/17 0600) Pulse Rate: 98  (09/17 0600) Intake/Output from previous day: 09/16 0701 - 09/17 0700 In: 360 [P.O.:360] Out: -   Labs:  Basename 03/05/12 0500 03/03/12 0955  WBC 13.2* 19.6*  HGB 8.5* 9.5*  PLT 416* 366  LABCREA -- --  CREATININE 1.67* 0.38*  Vancomycin trough level 03/04/12 1230pm = 19.7    Microbiology: 9/13: Blood cultures x 2: NG to date  Medical History: Past Medical History  Diagnosis Date  . MS (multiple sclerosis)   . HTN (hypertension)   . DM II (diabetes mellitus, type II), controlled   . Lymphedema     Assessment:  27 YOF with lower leg cellulitis, leukocytosis, and fever, UTI  Day #4 of empiric Vancomycin 750mg  IV q12h and Zosyn 3.375 grams IV q8h (extended-infusion). Patient had already received 2 days of ceftriaxone.  SCr up sharply despite reduction in Vancomycin dosage yesterday.  Goal of Therapy:  Vancomycin trough level 10-15 mcg/ml  Plan:   Hold Vancomycin until further notice.  Vancomycin level today at 4pm (=trough on previous schedule).  Continue present Zosyn dosage.  Hope Budds, PharmD, BCPS Pager: [phone] 03/05/2012 8:31 AM

## 2012-03-06 LAB — BASIC METABOLIC PANEL
CO2: 22 mEq/L (ref 19–32)
Calcium: 8.5 mg/dL (ref 8.4–10.5)
GFR calc non Af Amer: 31 mL/min — ABNORMAL LOW (ref >90)
Glucose, Bld: 118 mg/dL — ABNORMAL HIGH (ref 70–99)
Potassium: 3.9 mEq/L (ref 3.5–5.1)
Sodium: 136 mEq/L (ref 135–145)

## 2012-03-06 LAB — VANCOMYCIN, RANDOM: Vancomycin Rm: 22.2 ug/mL

## 2012-03-06 NOTE — Progress Notes (Signed)
I met with pt at bedside on 9/17 to discuss d/c planning. She told me that her mother who is 57 years old lives with her and is not able to assist in her care. Pt does have [facility] services 4 hrs/day 7 days a week and has tried to get the hrs increased but been denied. She stated the [facility] aide comes in the morning and pt has to wait till the following  morning to be changed and cleaned after she soils herself. Pt has a hospital bed but cannot sleep in it because there is no one that can put her in it at night. I discussed [facility] program with with and sought her approval to make a referral which she agreed. I spoke with Dedra with [facility] and they will see pt at her home after d/c to do an assessment.  In the meantime I discussed with pt the possibility of her PCS agency splitting her hrs so that an aide can come in the morning and in the evening and she stated she would call her agency and see if that was possible. She also said she would consider changing agencies too if that would help.  Pt stated that she has support from her family but they are not able to provide the care she needs since they are mostly males.

## 2012-03-06 NOTE — Clinical Documentation Improvement (Signed)
RENAL FAILURE DOCUMENTATION CLARIFICATION QUERY  THIS DOCUMENT IS NOT A PERMANENT PART OF THE MEDICAL RECORD  TO RESPOND TO THE THIS QUERY, FOLLOW THE INSTRUCTIONS BELOW:  1. If needed, update documentation for the patient's encounter via the notes activity.  2. Access this query again and click edit on the In [employer].  3. After updating, or not, click F2 to complete all highlighted (required) fields concerning your review. Select "additional documentation in the medical record" OR "no additional documentation provided".  4. Click Sign note button.  5. The deficiency will fall out of your In Basket *Please let us know if you are not able to complete this workflow by phone or e-mail (listed below).  Please update your documentation within the medical record to reflect your response to this query.                                                                                    03/06/12  Dear Dr. Thurnell Lose Marton [place]   In a better effort to capture your patient's severity of illness, reflect appropriate length of stay and utilization of resources, a review of the patient medical record has revealed the following indicators.    Based on your clinical judgment, please clarify and document in a progress note and/or discharge summary the clinical condition associated with the following supporting information:  In responding to this query please exercise your independent judgment.  The fact that a query is asked, does not imply that any particular answer is desired or expected.  Appreciated documentation of "acute renal insufficiency".  Due to the sudden increase in the creatinine level would either of the following diagnoses be more specific,  if appropriate please document.   Thank you   Possible Clinical Conditions?   Acute Renal Failure  Acute Kidney Injury  Other Condition  Cannot Clinically Determine     Baseline Cr Level : 0.38 (9/15), 0.37 (9/14)  Current Creatinine  Level (trend): 1.67 (9/17)  Electrolytes: GFR: 38 (9/17), >90 (9/15)  Evaluation: BMET daily                   You may use possible, probable, or suspect with inpatient documentation. possible, probable, suspected diagnoses MUST be documented at the time of discharge  Reviewed: additional documentation in the medical record  Thank You,  Leonette Most Addison  Clinical Documentation Specialist RN, BSN: Pager [phone] HIM Off # [phone]  Health Information Management [place]

## 2012-03-06 NOTE — Progress Notes (Signed)
Subjective:  Feeling better but feels warm also. No chest pain. Low [place] level of 17.  Objective:  Vital Signs in the last 24 hours: Temp:  [99.1 F (37.3 C)-100.3 F (37.9 C)] 100 F (37.8 C) (09/18 1446) Pulse Rate:  [82-97] 82  (09/18 1446) Cardiac Rhythm:  [-] Normal sinus rhythm (09/18 0820) Resp:  [16-18] 18  (09/18 1446) BP: (134-149)/(78-85) 149/85 mmHg (09/18 1446) SpO2:  [96 %-99 %] 99 % (09/18 1446)  Physical Exam: BP Readings from Last 1 Encounters:  03/06/12 149/85    Wt Readings from Last 1 Encounters:  03/03/12 74.844 kg (165 lb)    Weight change:   HEENT: [place]/AT, Eyes-Brown, PERL, EOMI, Conjunctiva-Pale, Sclera-Non-icteric Neck: No JVD, No bruit, Trachea midline. Lungs:  Clear, Bilateral. Cardiac:  Regular rhythm, normal S1 and S2, no S3.  Abdomen:  Soft, non-tender. Extremities:  Trace edema present. No cyanosis. No clubbing. + small ulcers on left lower extremity CNS: AxOx3, Cranial nerves grossly intact, moves all 4 extremities. Right handed. Skin: Warm and dry.   Intake/Output from previous day: 09/17 0701 - 09/18 0700 In: 780 [P.O.:480; IV Piggyback:300] Out: -     Lab Results: BMET    Component Value Date/Time   NA 136 03/06/2012 0503   K 3.9 03/06/2012 0503   CL 101 03/06/2012 0503   CO2 22 03/06/2012 0503   GLUCOSE 118* 03/06/2012 0503   BUN 19 03/06/2012 0503   CREATININE 1.76* 03/06/2012 0503   CALCIUM 8.5 03/06/2012 0503   GFRNONAA 31* 03/06/2012 0503   GFRAA 36* 03/06/2012 0503   CBC    Component Value Date/Time   WBC 13.2* 03/05/2012 0500   RBC 3.38* 03/05/2012 0500   HGB 8.5* 03/05/2012 0500   HCT 26.0* 03/05/2012 0500   PLT 416* 03/05/2012 0500   MCV 76.9* 03/05/2012 0500   MCH 25.1* 03/05/2012 0500   MCHC 32.7 03/05/2012 0500   RDW 15.1 03/05/2012 0500   LYMPHSABS 0.7 12/17/2011 1639   MONOABS 0.4 12/17/2011 1639   EOSABS 0.0 12/17/2011 1639   BASOSABS 0.0 12/17/2011 1639   CARDIAC ENZYMES No results found for this basename: CKTOTAL,  CKMB, CKMBINDEX, TROPONINI    Assessment/Plan:  Patient Active Hospital Problem List: Fever with possible left leg cellulitis UTI  Multiple sclerosis with weakness  Chronic lymphedema of both lower ext.  Hyponatremia-resolved.  Acute on chronic renal insufficiency  -stable, cause unknown, worsened by lasix use for leg edema     LOS: 5 days    Orpah Cobb  MD  03/06/2012, 8:02 PM

## 2012-03-06 NOTE — Progress Notes (Signed)
Patient refusing to get another IV placed.  Old IV in right antecube is patent, no redness no swelling.  Called placed to Dr. Algie Coffer. As per doctor, may leave current IV in for another 24 hours. Will continue to monitor.

## 2012-03-06 NOTE — Progress Notes (Signed)
ANTIBIOTIC CONSULT NOTE - FOLLOW UP  Pharmacy Consult for Vancomycin, Zosyn Indication: cellulitis, UTI  Allergies  Allergen Reactions  . Sulfa Antibiotics Shortness Of Breath    Patient Measurements: Height: 5\' 3"  (160 cm) Weight: 165 lb (74.844 kg) IBW/kg (Calculated) : 52.4   Vital Signs: Temp: 99.1 F (37.3 C) (09/18 0541) Temp src: Oral (09/18 0541) BP: 134/78 mmHg (09/18 0541) Pulse Rate: 89  (09/18 0541) Intake/Output from previous day: 09/17 0701 - 09/18 0700 In: 780 [P.O.:480; IV Piggyback:300] Out: -   Labs:  Basename 03/06/12 0503 03/05/12 0500 03/03/12 0955  WBC -- 13.2* 19.6*  HGB -- 8.5* 9.5*  PLT -- 416* 366  LABCREA -- -- --  CREATININE 1.76* 1.67* 0.38*   Estimated Creatinine Clearance: 34.2 ml/min (by C-G formula based on Cr of 1.76).  Basename 03/06/12 0503 03/04/12 1230  VANCOTROUGH -- 19.7  VANCOPEAK -- --  VANCORANDOM 22.2 --  GENTTROUGH -- --  GENTPEAK -- --  GENTRANDOM -- --  TOBRATROUGH -- --  TOBRAPEAK -- --  TOBRARND -- --  AMIKACINPEAK -- --  AMIKACINTROU -- --  AMIKACIN -- --     Microbiology: Recent Results (from the past 720 hour(s))  CULTURE, BLOOD (ROUTINE X 2)     Status: Normal (Preliminary result)   Collection Time   03/01/12 10:10 AM      Component Value Range Status Comment   Specimen Description BLOOD RIGHT ANTECUBITAL   Final    Special Requests BOTTLES DRAWN AEROBIC AND ANAEROBIC   Final    Culture  Setup Time 03/01/2012 13:24   Final    Culture     Final    Value:        BLOOD CULTURE RECEIVED NO GROWTH TO DATE CULTURE WILL BE HELD FOR 5 DAYS BEFORE ISSUING A FINAL NEGATIVE REPORT   Report Status PENDING   Incomplete   CULTURE, BLOOD (ROUTINE X 2)     Status: Normal (Preliminary result)   Collection Time   03/01/12 11:25 AM      Component Value Range Status Comment   Specimen Description BLOOD LEFT HAND  1.5 ML IN AEROBIC ONLY   Final    Special Requests Immunocompromised   Final    Culture  Setup Time  03/01/2012 23:48   Final    Culture     Final    Value:        BLOOD CULTURE RECEIVED NO GROWTH TO DATE CULTURE WILL BE HELD FOR 5 DAYS BEFORE ISSUING A FINAL NEGATIVE REPORT   Report Status PENDING   Incomplete     Anti-infectives     Start     Dose/Rate Route Frequency Ordered Stop   03/05/12 0200   vancomycin (VANCOCIN) 750 mg in sodium chloride 0.9 % 150 mL IVPB  Status:  Discontinued        750 mg 150 mL/hr over 60 Minutes Intravenous Every 12 hours 03/04/12 1407 03/05/12 0823   03/04/12 1415   vancomycin (VANCOCIN) 750 mg in sodium chloride 0.9 % 150 mL IVPB        750 mg 150 mL/hr over 60 Minutes Intravenous  Once 03/04/12 1406 03/04/12 1535   03/02/12 1300  piperacillin-tazobactam (ZOSYN) IVPB 3.375 g       3.375 g 12.5 mL/hr over 240 Minutes Intravenous Every 8 hours 03/02/12 1218     03/02/12 1300   vancomycin (VANCOCIN) IVPB 1000 mg/200 mL premix  Status:  Discontinued  1,000 mg 200 mL/hr over 60 Minutes Intravenous Every 12 hours 03/02/12 1218 03/04/12 1406   03/02/12 1000   cefTRIAXone (ROCEPHIN) 1 g in dextrose 5 % 50 mL IVPB  Status:  Discontinued        1 g 100 mL/hr over 30 Minutes Intravenous Every 24 hours 03/01/12 1421 03/02/12 1156   03/01/12 1115   cefTRIAXone (ROCEPHIN) 1 g in dextrose 5 % 50 mL IVPB        1 g 100 mL/hr over 30 Minutes Intravenous  Once 03/01/12 1102 03/01/12 1257          Assessment:  56 YOF with lower leg cellulitis, leukocytosis, and fever, UTI  Day #5 of empiric Vancomycin and Zosyn. Patient had already received 2 days of ceftriaxone.  SCr up sharply yesterday and has increased again today, despite prior reduction in Vancomycin dosage. Vancomycin trough level of 22.2 is above the goal range   Goal of Therapy:  Vancomycin trough level 10-15 mcg/ml  Plan: Hold Vancomycin until further notice.  Vancomycin random level tomorrow morning.    Continue Zosyn 3.375g IV Q8H infused over 4hrs.    Lynann Beaver PharmD,  BCPS Pager [phone] 03/06/2012 8:08 AM

## 2012-03-06 NOTE — Progress Notes (Signed)
Dressings to bilateral lower extremities changed.  Right and left lower leg wounds cleansed with normal saline.  New aquacel applied 4x4 and kerlix.  Bilateral feet elevated on 2 pillows.  Encouraged patient to turn more often in the bed but patient refuses position changes often.  Tegasorb intact to sacrum.  Will continue to monitor.

## 2012-03-07 LAB — VANCOMYCIN, RANDOM: Vancomycin Rm: 12.6 ug/mL

## 2012-03-07 LAB — CULTURE, BLOOD (ROUTINE X 2): Culture: NO GROWTH

## 2012-03-07 MED ORDER — FERROUS SULFATE 325 (65 FE) MG PO TABS
325.0000 mg | ORAL_TABLET | Freq: Every day | ORAL | Status: DC
  Administered 2012-03-07 – 2012-03-11 (×4): 325 mg via ORAL
  Filled 2012-03-07 (×6): qty 1

## 2012-03-07 MED ORDER — VANCOMYCIN HCL 1000 MG IV SOLR
750.0000 mg | Freq: Once | INTRAVENOUS | Status: AC
  Administered 2012-03-07: 750 mg via INTRAVENOUS
  Filled 2012-03-07: qty 750

## 2012-03-07 MED ORDER — LOPERAMIDE HCL 2 MG PO CAPS
2.0000 mg | ORAL_CAPSULE | ORAL | Status: DC | PRN
  Administered 2012-03-07: 2 mg via ORAL
  Filled 2012-03-07: qty 1

## 2012-03-07 NOTE — Progress Notes (Signed)
Pt refused blood draw for morning labs this am. Will try to educated patient of importance of blood work for diagnostic values.

## 2012-03-07 NOTE — Progress Notes (Signed)
ANTIBIOTIC CONSULT NOTE - FOLLOW UP  Pharmacy Consult for Vancomycin, Zosyn Indication: cellulitis, UTI  Allergies  Allergen Reactions  . Sulfa Antibiotics Shortness Of Breath    Patient Measurements: Height: 5\' 3"  (160 cm) Weight: 165 lb (74.844 kg) IBW/kg (Calculated) : 52.4   Vital Signs: Temp: 99.5 F (37.5 C) (09/19 0537) Temp src: Oral (09/19 0537) BP: 136/53 mmHg (09/19 0537) Pulse Rate: 81  (09/19 0537) Intake/Output from previous day: 09/18 0701 - 09/19 0700 In: 650 [P.O.:480; I.V.:120; IV Piggyback:50] Out: -   Labs:  Basename 03/06/12 0503 03/05/12 0500  WBC -- 13.2*  HGB -- 8.5*  PLT -- 416*  LABCREA -- --  CREATININE 1.76* 1.67*   Estimated Creatinine Clearance: 34.2 ml/min (by C-G formula based on Cr of 1.76).  Basename 03/07/12 0535 03/06/12 0503 03/04/12 1230  VANCOTROUGH -- -- 19.7  VANCOPEAK -- -- --  VANCORANDOM 12.6 22.2 --  GENTTROUGH -- -- --  GENTPEAK -- -- --  GENTRANDOM -- -- --  TOBRATROUGH -- -- --  TOBRAPEAK -- -- --  TOBRARND -- -- --  AMIKACINPEAK -- -- --  AMIKACINTROU -- -- --  AMIKACIN -- -- --     Microbiology: 9/13 blood x2: NGTD 9/13: U/A +  Assessment:  56 YOF with lower leg cellulitis, leukocytosis, and fever, UTI  Day #6 of empiric Vancomycin and Zosyn. Patient had already received 2 days of ceftriaxone.  SCr increasing sharply so have d/c'd scheduled vancomycin doses and will be dosing as needed based on vancomycin levels. Vancomycin trough level at goal this AM at 12.6.  Last dose of vancomycin was 750 mg given 52 hours prior to level being drawn.   No SCr this AM.  Will need to reorder for tomorrow.   Goal of Therapy:  Vancomycin trough level 10-15 mcg/ml  Plan:  Vancomycin 750 mg IV once now.  F/u SCr in AM.    Continue Zosyn 3.375g IV Q8H infused over 4hrs.  Clance Boll, PharmD, BCPS Pager: [phone] 03/07/2012 8:21 AM

## 2012-03-07 NOTE — Progress Notes (Signed)
Subjective:  Feeling better. Against NH placement. Low Iron level  Objective:  Vital Signs in the last 24 hours: Temp:  [99.5 F (37.5 C)-99.8 F (37.7 C)] 99.8 F (37.7 C) (09/19 1712) Pulse Rate:  [81-85] 85  (09/19 1712) Cardiac Rhythm:  [-] Normal sinus rhythm (09/19 0758) Resp:  [18] 18  (09/19 1712) BP: (127-136)/(53-73) 127/73 mmHg (09/19 1712) SpO2:  [98 %-100 %] 98 % (09/19 1712)  Physical Exam: BP Readings from Last 1 Encounters:  03/07/12 127/73    Wt Readings from Last 1 Encounters:  03/03/12 74.844 kg (165 lb)    Weight change:   HEENT: [place]/AT, Eyes-Brown, PERL, EOMI, Conjunctiva-Pale, Sclera-Non-icteric Neck: No JVD, No bruit, Trachea midline. Lungs:  Clear, Bilateral. Cardiac:  Regular rhythm, normal S1 and S2, no S3.  Abdomen:  Soft, non-tender. Extremities:  Trace edema present. No cyanosis. No clubbing. + Ulcers on left lower extremity and buttocks CNS: AxOx3, Cranial nerves grossly intact, moves all 4 extremities. Right handed. Skin: Warm and dry.   Intake/Output from previous day: 09/18 0701 - 09/19 0700 In: 650 [P.O.:480; I.V.:120; IV Piggyback:50] Out: -     Lab Results: BMET    Component Value Date/Time   NA 136 03/06/2012 0503   K 3.9 03/06/2012 0503   CL 101 03/06/2012 0503   CO2 22 03/06/2012 0503   GLUCOSE 118* 03/06/2012 0503   BUN 19 03/06/2012 0503   CREATININE 1.76* 03/06/2012 0503   CALCIUM 8.5 03/06/2012 0503   GFRNONAA 31* 03/06/2012 0503   GFRAA 36* 03/06/2012 0503   CBC    Component Value Date/Time   WBC 13.2* 03/05/2012 0500   RBC 3.38* 03/05/2012 0500   HGB 8.5* 03/05/2012 0500   HCT 26.0* 03/05/2012 0500   PLT 416* 03/05/2012 0500   MCV 76.9* 03/05/2012 0500   MCH 25.1* 03/05/2012 0500   MCHC 32.7 03/05/2012 0500   RDW 15.1 03/05/2012 0500   LYMPHSABS 0.7 12/17/2011 1639   MONOABS 0.4 12/17/2011 1639   EOSABS 0.0 12/17/2011 1639   BASOSABS 0.0 12/17/2011 1639   CARDIAC ENZYMES No results found for this basename: CKTOTAL, CKMB,  CKMBINDEX, TROPONINI    Assessment/Plan:  Patient Active Hospital Problem List:  Fever with possible left leg cellulitis  UTI  Multiple sclerosis with weakness  Chronic lymphedema of both lower ext.  Hyponatremia-resolved.  Acute on chronic renal insufficiency  -stable, cause unknown, worsened by lasix use for leg edema  Iron supplement   LOS: 6 days    Orpah Cobb  MD  03/07/2012, 9:21 PM

## 2012-03-07 NOTE — Progress Notes (Signed)
Discussed discharge destination with patient.  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

## 2012-03-08 LAB — CBC
HCT: 26 % — ABNORMAL LOW (ref 36.0–46.0)
Hemoglobin: 8.5 g/dL — ABNORMAL LOW (ref 12.0–15.0)
MCHC: 32.7 g/dL (ref 30.0–36.0)
RDW: 15.6 % — ABNORMAL HIGH (ref 11.5–15.5)
WBC: 17.4 10*3/uL — ABNORMAL HIGH (ref 4.0–10.5)

## 2012-03-08 LAB — COMPREHENSIVE METABOLIC PANEL
ALT: 9 U/L (ref 0–35)
AST: 28 U/L (ref 0–37)
Albumin: 2 g/dL — ABNORMAL LOW (ref 3.5–5.2)
Alkaline Phosphatase: 47 U/L (ref 39–117)
BUN: 15 mg/dL (ref 6–23)
Chloride: 103 mEq/L (ref 96–112)
Potassium: 4.2 mEq/L (ref 3.5–5.1)
Sodium: 137 mEq/L (ref 135–145)
Total Bilirubin: 0.2 mg/dL — ABNORMAL LOW (ref 0.3–1.2)
Total Protein: 6.3 g/dL (ref 6.0–8.3)

## 2012-03-08 LAB — CLOSTRIDIUM DIFFICILE BY PCR: Toxigenic C. Difficile by PCR: NEGATIVE

## 2012-03-08 NOTE — Progress Notes (Signed)
Subjective:  Discussed accepting NH in AM and she is willing to go for few weeks if skin infection improves.  Objective:  Vital Signs in the last 24 hours: Temp:  [98.3 F (36.8 C)-100.9 F (38.3 C)] 98.9 F (37.2 C) (09/20 1325) Pulse Rate:  [81-87] 85  (09/20 1325) Cardiac Rhythm:  [-] Normal sinus rhythm (09/20 0930) Resp:  [17-18] 17  (09/20 1325) BP: (137-154)/(63-73) 137/68 mmHg (09/20 1325) SpO2:  [100 %] 100 % (09/20 1325)  Physical Exam: BP Readings from Last 1 Encounters:  03/08/12 137/68    Wt Readings from Last 1 Encounters:  03/03/12 74.844 kg (165 lb)    Weight change:   HEENT: [place]/AT, Eyes-Brown, PERL, EOMI, Conjunctiva-Pale, Sclera-Non-icteric Neck: No JVD, No bruit, Trachea midline. Lungs:  Clear, Bilateral. Cardiac:  Regular rhythm, normal S1 and S2, no S3.  Abdomen:  Soft, non-tender. Extremities:  No edema present. No cyanosis. No clubbing. + small ulcers on left lower extremity and buttocks. CNS: AxOx3, Cranial nerves grossly intact. Right handed. Skin: Warm and dry.   Intake/Output from previous day: 09/19 0701 - 09/20 0700 In: 360 [P.O.:360] Out: -     Lab Results: BMET    Component Value Date/Time   NA 137 03/08/2012 0440   K 4.2 03/08/2012 0440   CL 103 03/08/2012 0440   CO2 22 03/08/2012 0440   GLUCOSE 123* 03/08/2012 0440   BUN 15 03/08/2012 0440   CREATININE 1.30* 03/08/2012 0440   CALCIUM 8.6 03/08/2012 0440   GFRNONAA 45* 03/08/2012 0440   GFRAA 52* 03/08/2012 0440   CBC    Component Value Date/Time   WBC 17.4* 03/08/2012 0440   RBC 3.37* 03/08/2012 0440   HGB 8.5* 03/08/2012 0440   HCT 26.0* 03/08/2012 0440   PLT 518* 03/08/2012 0440   MCV 77.2* 03/08/2012 0440   MCH 25.2* 03/08/2012 0440   MCHC 32.7 03/08/2012 0440   RDW 15.6* 03/08/2012 0440   LYMPHSABS 0.7 12/17/2011 1639   MONOABS 0.4 12/17/2011 1639   EOSABS 0.0 12/17/2011 1639   BASOSABS 0.0 12/17/2011 1639   CARDIAC ENZYMES No results found for this basename: CKTOTAL, CKMB,  CKMBINDEX, TROPONINI    Assessment/Plan:  Patient Active Hospital Problem List: Fever with possible left leg cellulitis  UTI  Multiple sclerosis with weakness  Chronic lymphedema of both lower ext.  Hyponatremia-resolved.  Acute on chronic renal insufficiency  -stable, cause unknown, worsened by lasix use for leg edema  Iron deficiency anemia  NH placement.   LOS: 7 days    Orpah Cobb  MD  03/08/2012, 6:04 PM

## 2012-03-08 NOTE — Progress Notes (Signed)
Pt requested to speak with me. I met with her and she informed me she was willing to go to SNF at d/c. Dr Algie Coffer please write an order for social worker consult for SNF placement.

## 2012-03-08 NOTE — Progress Notes (Signed)
Patient agreeable to having right antecube iv changed as it is overdue.  IV team notified.

## 2012-03-09 NOTE — Progress Notes (Addendum)
Clinical Social Work Department CLINICAL SOCIAL WORK PLACEMENT NOTE 03/09/2012  Patient:  Jocelyn Sanchez, Jocelyn Sanchez  Account Number:  [id number] Admit date:  03/01/2012  Clinical Social Worker:  Leron Croak, CLINICAL SOCIAL WORKER  Date/time:  03/09/2012 05:41 PM  Clinical Social Work is seeking post-discharge placement for this patient at the following level of care:   SKILLED NURSING   (*CSW will update this form in Epic as items are completed)   03/09/2012  Patient/family provided with [facility] Department of Clinical Social Work's list of facilities offering this level of care within the geographic area requested by the patient (or if unable, by the patient's family).  03/09/2012  Patient/family informed of their freedom to choose among providers that offer the needed level of care, that participate in Medicare, Medicaid or managed care program needed by the patient, have an available bed and are willing to accept the patient.  03/09/2012  Patient/family informed of [facility]' ownership interest in [facility], as well as of the fact that they are under no obligation to receive care at this facility.  PASARR submitted to EDS on 03/09/2012 PASARR number received from EDS on 03/09/2012  FL2 transmitted to all facilities in geographic area requested by pt/family on  03/09/2012 FL2 transmitted to all facilities within larger geographic area on 03/09/2012  Patient informed that his/her managed care company has contracts with or will negotiate with  certain facilities, including the following:     Patient/family informed of bed offers received:  03/11/2012 Dorma Russell) Patient chooses bed at [facility]) Physician recommends and patient chooses bed at  [facility])  Patient to be transferred to [facility]  on  03/11/2012 (JB) Patient to be transferred to facility by [facility])  The following physician request were entered in Epic:   Additional Comments:  Bernadene Person Long Weekend Coverage [phone]

## 2012-03-09 NOTE — Progress Notes (Signed)
Subjective:  Patient denies any chest pain or shortness of breath  Objective:  Vital Signs in the last 24 hours: Temp:  [98.9 F (37.2 C)-100.6 F (38.1 C)] 99.5 F (37.5 C) (09/21 0600) Pulse Rate:  [82-91] 91  (09/21 0600) Resp:  [17-18] 18  (09/21 0600) BP: (137-159)/(67-70) 157/67 mmHg (09/21 0600) SpO2:  [100 %] 100 % (09/21 0600)  Intake/Output from previous day:   Intake/Output from this shift:    Physical Exam: Neck: no adenopathy, no carotid bruit, no JVD and supple, symmetrical, trachea midline Lungs: clear to auscultation bilaterally Heart: regular rate and rhythm, S1, S2 normal, no murmur, click, rub or gallop Abdomen: soft, non-tender; bowel sounds normal; no masses,  no organomegaly Extremities: No clubbing cyanosis 2+ edema with small decubitus ulcers  Lab Results:  Basename 03/08/12 0440  WBC 17.4*  HGB 8.5*  PLT 518*    Basename 03/08/12 0440  NA 137  K 4.2  CL 103  CO2 22  GLUCOSE 123*  BUN 15  CREATININE 1.30*   No results found for this basename: TROPONINI:2,CK,MB:2 in the last 72 hours Hepatic Function Panel  Basename 03/08/12 0440  PROT 6.3  ALBUMIN 2.0*  AST 28  ALT 9  ALKPHOS 47  BILITOT 0.2*  BILIDIR --  IBILI --   No results found for this basename: CHOL in the last 72 hours No results found for this basename: PROTIME in the last 72 hours  Imaging: Imaging results have been reviewed and No results found.  Cardiac Studies:  Assessment/Plan:  Resolving cellulitis UTI Multiple sclerosis with generalized weakness Chronic lymphedema Anemia Hypoalbuminemia Chronic kidney disease stage II Plan Continue present management Awaiting skilled nursing facility  LOS: 8 days    Jocelyn Sanchez N 03/09/2012, 12:45 PM

## 2012-03-09 NOTE — Progress Notes (Signed)
Clinical Social Work Department BRIEF PSYCHOSOCIAL ASSESSMENT 03/09/2012  Patient:  Jocelyn Sanchez, Jocelyn Sanchez     Account Number:  [id number]     Admit date:  03/01/2012  Clinical Social Worker:  Leron Croak, CLINICAL SOCIAL WORKER  Date/Time:  03/09/2012 05:02 PM  Referred by:  Physician  Date Referred:  03/08/2012 Referred for  SNF Placement   Other Referral:   Interview type:  Patient Other interview type:    PSYCHOSOCIAL DATA Living Status:  FAMILY Admitted from facility:   Level of care:   Primary support name:  Clearnce Sorrel Primary support relationship to patient:  NONE Degree of support available:   Pt has HH Aid, however is in need of more hours or SNF placement    CURRENT CONCERNS Current Concerns  Post-Acute Placement   Other Concerns:    SOCIAL WORK ASSESSMENT / PLAN CSW met with Pt at the bedside. Pt was expecting CSW visit and agreeable for search in [facility]. Pt stated that her family has been looking at a few facilities in the immediate area ([place]) and was eager for assistance. Pt stated that she knows that she is in need for more assistance and the MD and herself had agreed on SNF placement for a brief time.   Assessment/plan status:  Information/Referral to [facility] Other assessment/ plan:   Information/referral to community resources:   CSW provided a list of facilities in the [facility] area and contact information for any additional questions that either herself or her family may have.    PATIENT'S/FAMILY'S RESPONSE TO PLAN OF CARE: Pt was appreciative for the assistance and will be contacting her family to inform them that a facility list was available.     Leron Croak, LCSWA [employer] Coverage [phone]

## 2012-03-10 MED ORDER — ENSURE COMPLETE PO LIQD
237.0000 mL | Freq: Two times a day (BID) | ORAL | Status: DC
  Administered 2012-03-10 – 2012-03-11 (×2): 237 mL via ORAL

## 2012-03-10 NOTE — Progress Notes (Signed)
INITIAL ADULT NUTRITION ASSESSMENT Date: 03/10/2012   Time: 2:24 PM  Reason for Assessment: Malnutrition screening tool, score of 2  ASSESSMENT: Female 56 y.o.  Dx: UTI, nausea and vomiting, cellulitis  Hx:  Past Medical History  Diagnosis Date  . MS (multiple sclerosis)   . HTN (hypertension)   . DM II (diabetes mellitus, type II), controlled   . Lymphedema     Related Meds:     . baclofen  5 mg Oral TID  . ferrous sulfate  325 mg Oral Q breakfast  . heparin  5,000 Units Subcutaneous Q8H  . metoprolol tartrate  25 mg Oral BID  . piperacillin-tazobactam (ZOSYN)  IV  3.375 g Intravenous Q8H  . sodium chloride  3 mL Intravenous Q12H     Ht: 5\' 3"  (160 cm)  Wt: 165 lb (74.844 kg)  Ideal Wt: 52.4 kg  % Ideal Wt: 143%  Usual Wt: 81.2 kg % Usual Wt: 92%  Body mass index is 29.23 kg/(m^2). Patient is overweight.  Food/Nutrition Related Hx: Fair appetite PTA.   Labs:  CMP     Component Value Date/Time   NA 137 03/08/2012 0440   K 4.2 03/08/2012 0440   CL 103 03/08/2012 0440   CO2 22 03/08/2012 0440   GLUCOSE 123* 03/08/2012 0440   BUN 15 03/08/2012 0440   CREATININE 1.30* 03/08/2012 0440   CALCIUM 8.6 03/08/2012 0440   PROT 6.3 03/08/2012 0440   ALBUMIN 2.0* 03/08/2012 0440   AST 28 03/08/2012 0440   ALT 9 03/08/2012 0440   ALKPHOS 47 03/08/2012 0440   BILITOT 0.2* 03/08/2012 0440   GFRNONAA 45* 03/08/2012 0440   GFRAA 52* 03/08/2012 0440    Intake/Output Summary (Last 24 hours) at 03/10/12 1425 Last data filed at 03/10/12 0800  Gross per 24 hour  Intake    360 ml  Output      0 ml  Net    360 ml     Diet Order: Heart Healthy, 25-100% intake   Supplements/Tube Feeding: None  IVF:    Estimated Nutritional Needs:   Kcal: 1550-1700 kcal Protein: 90-100 g Fluid: 2.6 L  Patient reports she has a fair appetite. However, this has improved in the last few days. She has lost 8% of her UBW in 3 months and PO intake has been <75% of needs, which meets the criteria  for severe malnutrition in the context of chronic illness.   NUTRITION DIAGNOSIS: -Inadequate oral intake (NI-2.1).  Status: Ongoing  RELATED TO: decreased appetite  AS EVIDENCE BY: meal intake <50%  MONITORING/EVALUATION(Goals): Patient will meet 90-100% of estimated nutrition needs  Monitor: PO intake, weight, labs  EDUCATION NEEDS: -No education needs identified at this time  INTERVENTION: 1. Ensure Complete BID  Linnell Fulling, RD, LDN Pager #: [phone] After-Hours Pager #: [phone]   DOCUMENTATION CODES Per approved criteria  -Severe malnutrition in the context of chronic illness    Fabio Pierce 03/10/2012, 2:24 PM

## 2012-03-10 NOTE — Progress Notes (Signed)
Subjective:  Patient denies any chest pain or shortness of breath  Objective:  Vital Signs in the last 24 hours: Temp:  [98.1 F (36.7 C)-101 F (38.3 C)] 98.1 F (36.7 C) (09/22 0600) Pulse Rate:  [74-86] 74  (09/22 0600) Resp:  [18] 18  (09/22 0600) BP: (149-161)/(74-80) 161/74 mmHg (09/22 0600) SpO2:  [96 %-98 %] 98 % (09/22 0600)  Intake/Output from previous day: 09/21 0701 - 09/22 0700 In: 240 [P.O.:240] Out: -  Intake/Output from this shift:    Physical Exam: Neck: no adenopathy, no carotid bruit, no JVD and supple, symmetrical, trachea midline Lungs: clear to auscultation bilaterally Heart: regular rate and rhythm, S1, S2 normal, no murmur, click, rub or gallop Abdomen: soft, non-tender; bowel sounds normal; no masses,  no organomegaly Extremities: No clubbing cyanosis 2+ edema and small ulcers noted  Lab Results:  Basename 03/08/12 0440  WBC 17.4*  HGB 8.5*  PLT 518*    Basename 03/08/12 0440  NA 137  K 4.2  CL 103  CO2 22  GLUCOSE 123*  BUN 15  CREATININE 1.30*   No results found for this basename: TROPONINI:2,CK,MB:2 in the last 72 hours Hepatic Function Panel  Basename 03/08/12 0440  PROT 6.3  ALBUMIN 2.0*  AST 28  ALT 9  ALKPHOS 47  BILITOT 0.2*  BILIDIR --  IBILI --   No results found for this basename: CHOL in the last 72 hours No results found for this basename: PROTIME in the last 72 hours  Imaging: Imaging results have been reviewed and No results found.  Cardiac Studies:  Assessment/Plan:  Resolving cellulitis  UTI  Multiple sclerosis with generalized weakness  Chronic lymphedema  Anemia  Hypoalbuminemia  Chronic kidney disease stage II Plan Continue present management awaiting skilled nursing facility Check labs in a.m.  LOS: 9 days    Hazen Brumett N 03/10/2012, 10:23 AM

## 2012-03-10 NOTE — Progress Notes (Signed)
ANTIBIOTIC CONSULT NOTE - FOLLOW UP  Pharmacy Consult for  Zosyn Indication: cellulitis, UTI  Allergies  Allergen Reactions  . Sulfa Antibiotics Shortness Of Breath    Patient Measurements: Height: 5\' 3"  (160 cm) Weight: 165 lb (74.844 kg) IBW/kg (Calculated) : 52.4   Vital Signs: Temp: 98.1 F (36.7 C) (09/22 0600) Temp src: Oral (09/22 0600) BP: 161/74 mmHg (09/22 0600) Pulse Rate: 74  (09/22 0600) Intake/Output from previous day: 09/21 0701 - 09/22 0700 In: 240 [P.O.:240] Out: -   Labs:  Basename 03/08/12 0440  WBC 17.4*  HGB 8.5*  PLT 518*  LABCREA --  CREATININE 1.30*   Estimated Creatinine Clearance: 46.3 ml/min (by C-G formula based on Cr of 1.3).   Microbiology: 9/13 blood x2: NGTD 9/13: U/A +  Assessment:   56 YOF with lower leg cellulitis, leukocytosis, and fever, UTI   Day #9 Zosyn. Patient had already received 2 days of ceftriaxone  Pt refusing labs  Goal of Therapy:  appropriate dose of zosyn  Plan:  Continue Zosyn 3.375g IV Q8H infused over 4hrs.  Change to oral abx and duration of therapy per MD  Will sign off, reconsult if necessary  Gwen Her PharmD  [phone] 03/10/2012 12:00 PM

## 2012-03-11 MED ORDER — CIPROFLOXACIN HCL 500 MG PO TABS: 500.0000 mg | ORAL_TABLET | Freq: Two times a day (BID) | ORAL | Status: DC

## 2012-03-11 MED ORDER — FERROUS SULFATE 325 (65 FE) MG PO TABS: 325.0000 mg | ORAL_TABLET | Freq: Every day | ORAL | Status: DC

## 2012-03-11 MED ORDER — METOPROLOL TARTRATE 25 MG PO TABS: 25.0000 mg | ORAL_TABLET | Freq: Two times a day (BID) | ORAL | Status: DC

## 2012-03-11 NOTE — Progress Notes (Addendum)
CSW spoke with pt and pt's mother re: bed offers. Pt has accepted bed offer from [facility]. Pt has bed available when stable for d/c. CSW will continue to follow.  Dellie Burns, MSW, [facility]ors [phone] (coverage)   ----Clovis Cao on chart for MD signature

## 2012-03-11 NOTE — Progress Notes (Signed)
Pt to be trasferred to [facility] today via [facility]. Pt, pt's mother, and SNf aware of d/c. D/C packet complete with signed hard Rx, chart copy, and signed FL2. CSW signing off as no other CSW needs identified at this time.  Dellie Burns, MSW, [place] [phone] (coverage)

## 2012-03-11 NOTE — Discharge Summary (Signed)
Physician Discharge Summary  Patient ID: Jocelyn Sanchez MRN: [id number] DOB/AGE: 03-11-1956 56 y.o.  Admit date: 03/01/2012 Discharge date: 03/11/2012  Admission Diagnoses: Fever with possible left leg cellulitis  UTI  Multiple sclerosis with weakness  Chronic lymphedema of both lower ext.  Hyponatremia  Acute on chronic renal insufficiency  Iron deficiency anemia  Discharge Diagnoses:  Active Problems:   *Left leg cellulitis*Principle diagnosis UTI  Multiple sclerosis with weakness of both lower extremity.  Chronic lymphedema of both lower ext.  Hyponatremia.  Acute on chronic renal insufficiency   Iron deficiency anemia   Discharged Condition: fair  Hospital Course: 56 years old female with MS and paraplegia had bilateral leg edema and cellulitis of left lower extremity and buttocks along with fever and leukocytosis. With IV lasix and IV antibiotics her cellulitis and decubitus ulder improved. She was discharged to Skilled Nursing Facility in stable condition with follow up in 2 weeks.  Consults: Child psychotherapist for [employer]  Significant Diagnostic Studies: labs: Leukocytosis, low Hgb of 8.5 with low iron level. BUN 15, Cr. 1.30. Normal electrolytes on 03/08/2012.  Pseudohyponatremia on admission with sugar near 200 mg range.  Treatments: antibiotics: vancomycin and Zosyn  Discharge Exam: Blood pressure 150/86, pulse 78, temperature 99.1 F (37.3 C), temperature source Oral, resp. rate 20, height 5\' 3"  (1.6 m), weight 74.844 kg (165 lb), SpO2 95.00%.  HEENT: [place]/AT, Eyes-Brown, PERL, EOMI, Conjunctiva-Pale, Sclera-Non-icteric  Neck: No JVD, No bruit, Trachea midline.  Lungs: Clear, Bilateral.  Cardiac: Regular rhythm, normal S1 and S2, no S3.  Abdomen: Soft, non-tender.  Extremities: No edema present. No cyanosis. No clubbing. + small ulcers on left lower extremity and buttocks.  CNS: AxOx3, Cranial nerves grossly intact. Right handed.  Skin: Warm and  dry.  Disposition: Skilled nursing facility     Medication List     As of 03/11/2012  2:39 PM    STOP taking these medications         interferon beta-1a 30 MCG/0.5ML injection   Commonly known as: AVONEX      TAKE these medications         baclofen 10 MG tablet   Commonly known as: LIORESAL   Take 0.5 tablets (5 mg total) by mouth 3 (three) times daily.      ciprofloxacin 500 MG tablet   Commonly known as: CIPRO   Take 1 tablet (500 mg total) by mouth 2 (two) times daily.      diphenhydramine-acetaminophen 25-500 MG Tabs   Commonly known as: TYLENOL PM   Take 1-2 tablets by mouth at bedtime as needed. For pain.      ferrous sulfate 325 (65 FE) MG tablet   Take 1 tablet (325 mg total) by mouth daily with breakfast.      HYDROcodone-acetaminophen 5-325 MG per tablet   Commonly known as: NORCO/VICODIN   Take 1 tablet by mouth every 6 (six) hours as needed. For pain.      metoprolol tartrate 25 MG tablet   Commonly known as: LOPRESSOR   Take 1 tablet (25 mg total) by mouth 2 (two) times daily.      olmesartan-hydrochlorothiazide 20-12.5 MG per tablet   Commonly known as: BENICAR HCT   Take 1 tablet by mouth daily.         SignedOrpah Cobb S 03/11/2012, 2:39 PM

## 2012-03-13 NOTE — Progress Notes (Signed)
Discharge summary sent to payer through [facility]  

## 2012-04-05 ENCOUNTER — Emergency Department (HOSPITAL_COMMUNITY)
Admission: EM | Admit: 2012-04-05 | Discharge: 2012-04-05 | Disposition: A | Discharge status: Home / Self Care | Payer: PRIVATE HEALTH INSURANCE | Attending: Emergency Medicine | Admitting: Emergency Medicine

## 2012-04-05 ENCOUNTER — Encounter (HOSPITAL_COMMUNITY): Payer: Self-pay | Admitting: *Deleted

## 2012-04-05 ENCOUNTER — Emergency Department (HOSPITAL_COMMUNITY): Payer: PRIVATE HEALTH INSURANCE

## 2012-04-05 DIAGNOSIS — I10 Essential (primary) hypertension: Secondary | ICD-10-CM | POA: Insufficient documentation

## 2012-04-05 DIAGNOSIS — G35 Multiple sclerosis: Secondary | ICD-10-CM | POA: Insufficient documentation

## 2012-04-05 DIAGNOSIS — Z79899 Other long term (current) drug therapy: Secondary | ICD-10-CM | POA: Insufficient documentation

## 2012-04-05 DIAGNOSIS — E119 Type 2 diabetes mellitus without complications: Secondary | ICD-10-CM | POA: Insufficient documentation

## 2012-04-05 DIAGNOSIS — R109 Unspecified abdominal pain: Secondary | ICD-10-CM | POA: Insufficient documentation

## 2012-04-05 HISTORY — DX: Anemia, unspecified: D64.9

## 2012-04-05 NOTE — ED Provider Notes (Signed)
History     CSN: [id number]  Arrival date & time 04/05/12  4098   First MD Initiated Contact with Patient 04/05/12 1037      Chief Complaint  Patient presents with  . Abdominal Pain    (Consider location/radiation/quality/duration/timing/severity/associated sxs/prior treatment) HPI Comments: This 56 year old female suffers from multiple sclerosis and is bedbound or chair bound.  Last night.  She states her home health aide, who was supposed to come to perform her p.m. ADLs.  Never showed up, and she wound up sleeping in her recliner all night.  This morning.  She woke with left upper and left lower quadrant discomfort.  She called EMS for assistance.  When she was laid flat on the stretcher.  This pain resolved.  She reports normal bowel movements.  Last night.  She denies any fever or dysuria, constipation, diarrhea, or feeling ill at this time.  Patient is a 56 y.o. female presenting with abdominal pain. The history is provided by the patient.  Abdominal Pain The primary symptoms of the illness do not include abdominal pain, fever, nausea, vomiting, diarrhea or dysuria. The current episode started 3 to 5 hours ago. The onset of the illness was gradual. The problem has been resolved.  Symptoms associated with the illness do not include chills or constipation.    Past Medical History  Diagnosis Date  . MS (multiple sclerosis)   . HTN (hypertension)   . DM II (diabetes mellitus, type II), controlled   . Lymphedema   . Anemia     History reviewed. No pertinent past surgical history.  Family History  Problem Relation Age of Onset  . Multiple sclerosis Mother     History  Substance Use Topics  . Smoking status: Former Smoker    Types: Cigarettes  . Smokeless tobacco: Never Used  . Alcohol Use: No    OB History    Grav Para Term Preterm Abortions TAB SAB Ect Mult Living                  Review of Systems  Constitutional: Negative for fever and chills.    Gastrointestinal: Negative for nausea, vomiting, abdominal pain, diarrhea and constipation.  Genitourinary: Negative for dysuria.  Musculoskeletal: Negative for myalgias.  Neurological: Negative for dizziness and weakness.    Allergies  Sulfa antibiotics  Home Medications   Current Outpatient Rx  Name Route Sig Dispense Refill  . BACLOFEN 10 MG PO TABS Oral Take 0.5 tablets (5 mg total) by mouth 3 (three) times daily. 30 each   . DIPHENHYDRAMINE-APAP (SLEEP) 25-500 MG PO TABS Oral Take 1-2 tablets by mouth at bedtime as needed. For pain.    Marland Kitchen FERROUS SULFATE 325 (65 FE) MG PO TABS Oral Take 1 tablet (325 mg total) by mouth daily with breakfast. 30 tablet 3  . HYDROCODONE-ACETAMINOPHEN 5-325 MG PO TABS Oral Take 1 tablet by mouth every 6 (six) hours as needed. For pain.    Marland Kitchen METOPROLOL TARTRATE 25 MG PO TABS Oral Take 1 tablet (25 mg total) by mouth 2 (two) times daily. 60 tablet 3  . OLMESARTAN MEDOXOMIL-HCTZ 20-12.5 MG PO TABS Oral Take 1 tablet by mouth daily.      BP 114/68  Pulse 104  Temp 98.2 F (36.8 C) (Oral)  Resp 18  SpO2 100%  Physical Exam  Constitutional: She appears well-developed and well-nourished.  HENT:  Head: Normocephalic.  Eyes: Pupils are equal, round, and reactive to light.  Neck: Normal range of motion.  Cardiovascular: Normal rate.   Pulmonary/Chest: Effort normal.  Abdominal: Soft. Bowel sounds are normal. She exhibits no distension. There is no tenderness.  Musculoskeletal: Normal range of motion.  Neurological: She is alert.  Skin: Skin is warm. No rash noted. No pallor.    ED Course  Procedures (including critical care time)  Labs Reviewed - No data to display Dg Abd Acute W/chest  04/05/2012  *RADIOLOGY REPORT*  Clinical Data: Left-sided pain.  Evaluate for obstruction.  ACUTE ABDOMEN SERIES (ABDOMEN 2 VIEW & CHEST 1 VIEW)  Comparison: Chest x-ray 05/10/2011  Findings: Heart and mediastinal contours are within normal limits. No focal  opacities or effusions.  No acute bony abnormality.  Moderate stool burden throughout the colon. There is normal bowel gas pattern.  No free air.  No organomegaly or suspicious calcification.  No acute bony abnormality.  Leftward scoliosis and degenerative changes in the lumbar spine.  IMPRESSION: Moderate stool burden.  No acute findings.   Original Report Authenticated By: Cyndie Chime, M.D.      1. Abdominal wall pain       MDM  Will check acute abdomen series due to patient complaint and inactivity but highly unlikely to have an obstruction at this time        Arman Filter, NP 04/05/12 1543

## 2012-04-05 NOTE — ED Provider Notes (Signed)
Medical screening examination/treatment/procedure(s) were performed by non-physician practitioner and as supervising physician I was immediately available for consultation/collaboration.  Raeford Razor, MD 04/05/12 [phone]

## 2012-04-05 NOTE — ED Notes (Signed)
Per ems: pt from home, LUQ and LLQ abd pain started today x2 hours. Denies n/v/d. Pain worse on palpation. Reports "pressure sore" on coccyx area that pt believes has gotten worse. Hx of MS, HTN, DM, anemia. bp 94/60, pulse 130, respirations 16 even/unlabored sa)2 96% on room air

## 2012-04-05 NOTE — ED Notes (Signed)
AVW:[id number]<XB> Expected date:04/05/12<BR> Expected time: 9:16 AM<BR> Means of arrival:Ambulance<BR> Comments:<BR> 56yoF, abd pain

## 2012-04-05 NOTE — ED Notes (Signed)
ptar called for pt transportation home 

## 2013-02-18 IMAGING — CR DG ABDOMEN ACUTE W/ 1V CHEST
4 series · 4 of 4 positions shown · non-contrast
Comparison: Chest x-ray 05/10/2011

CLINICAL DATA: Left-sided pain.  Evaluate for obstruction.

ACUTE ABDOMEN SERIES (ABDOMEN 2 VIEW & CHEST 1 VIEW)

[x chest ap]
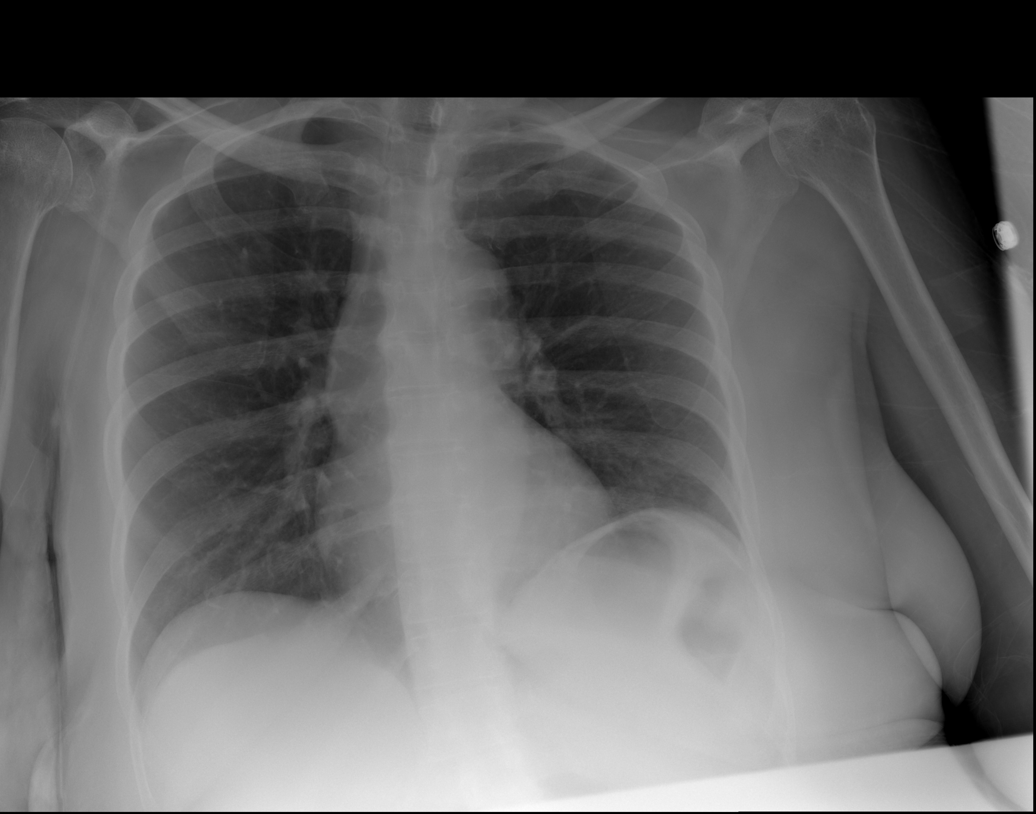

[w abdomen decub (1 of 2)]
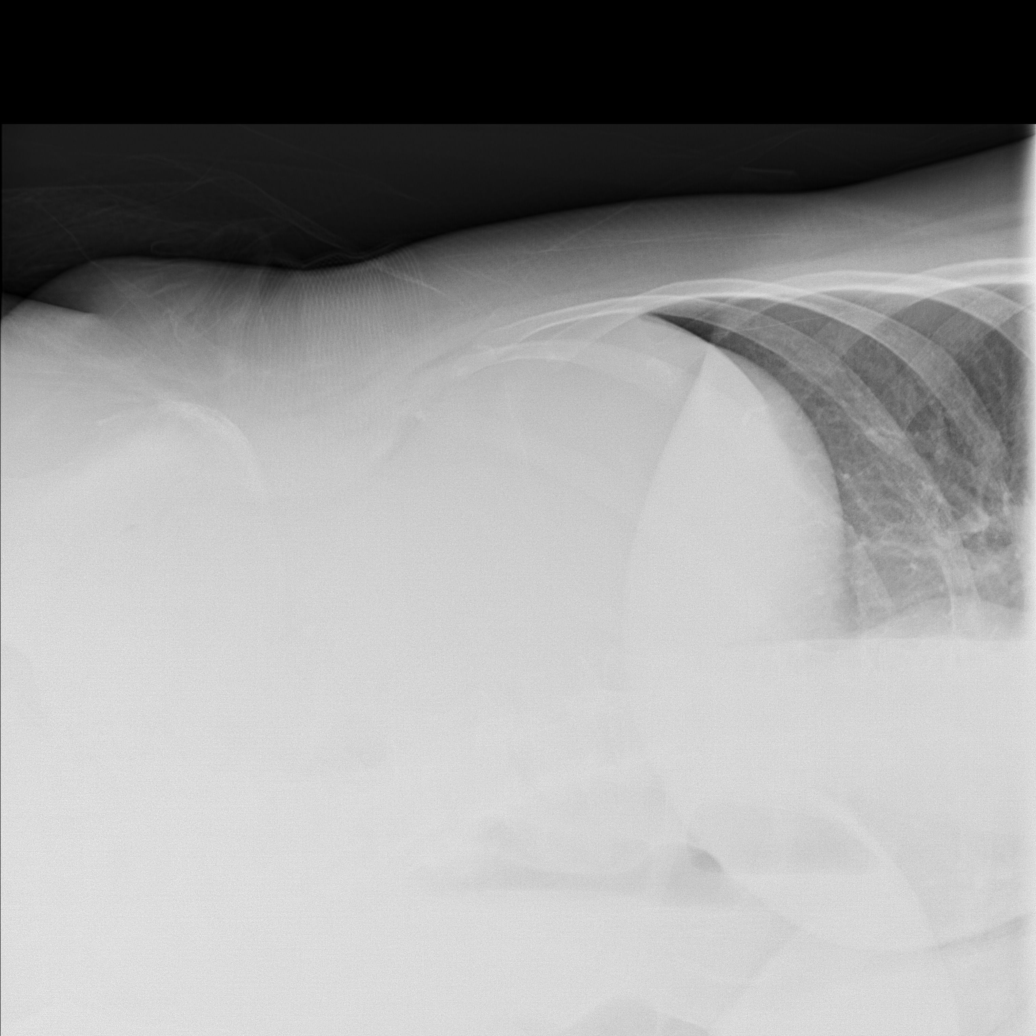

[w abdomen decub (2 of 2)]
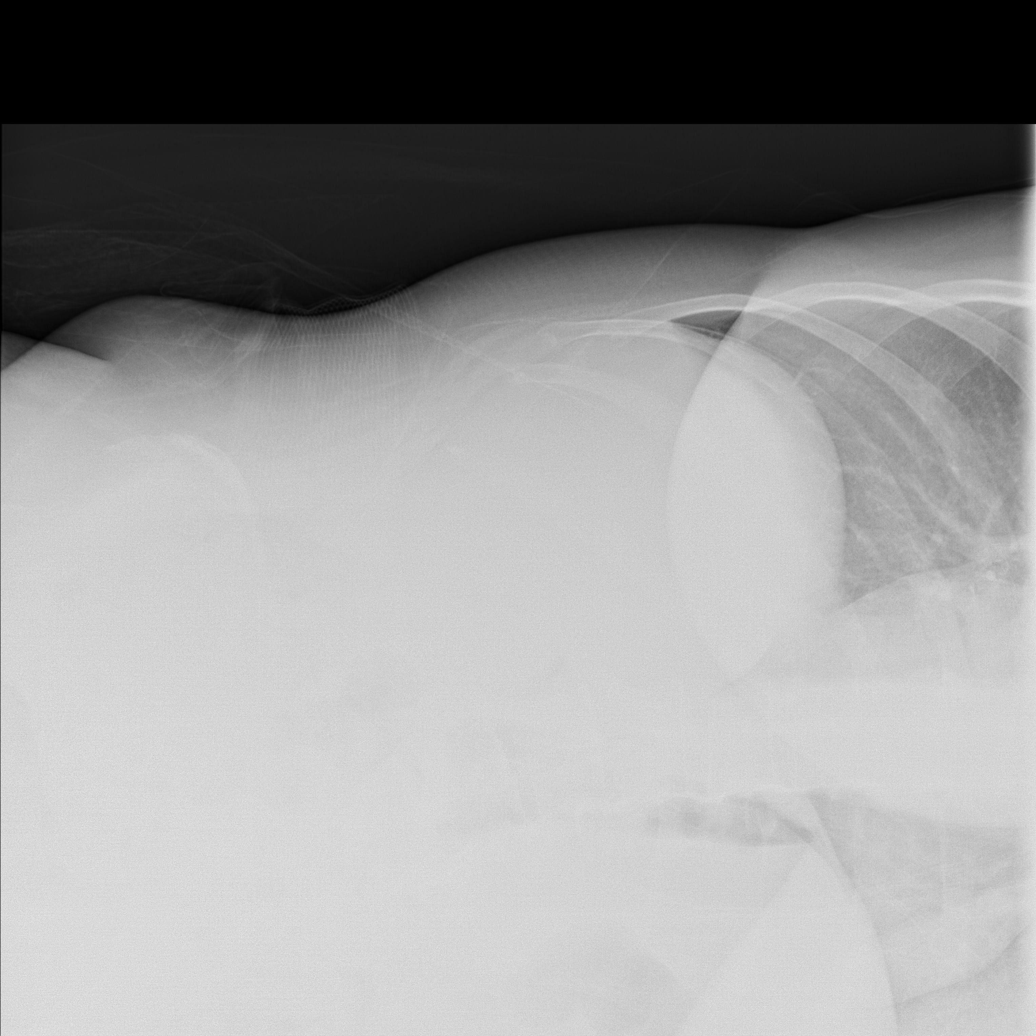

[x abdomen supine]
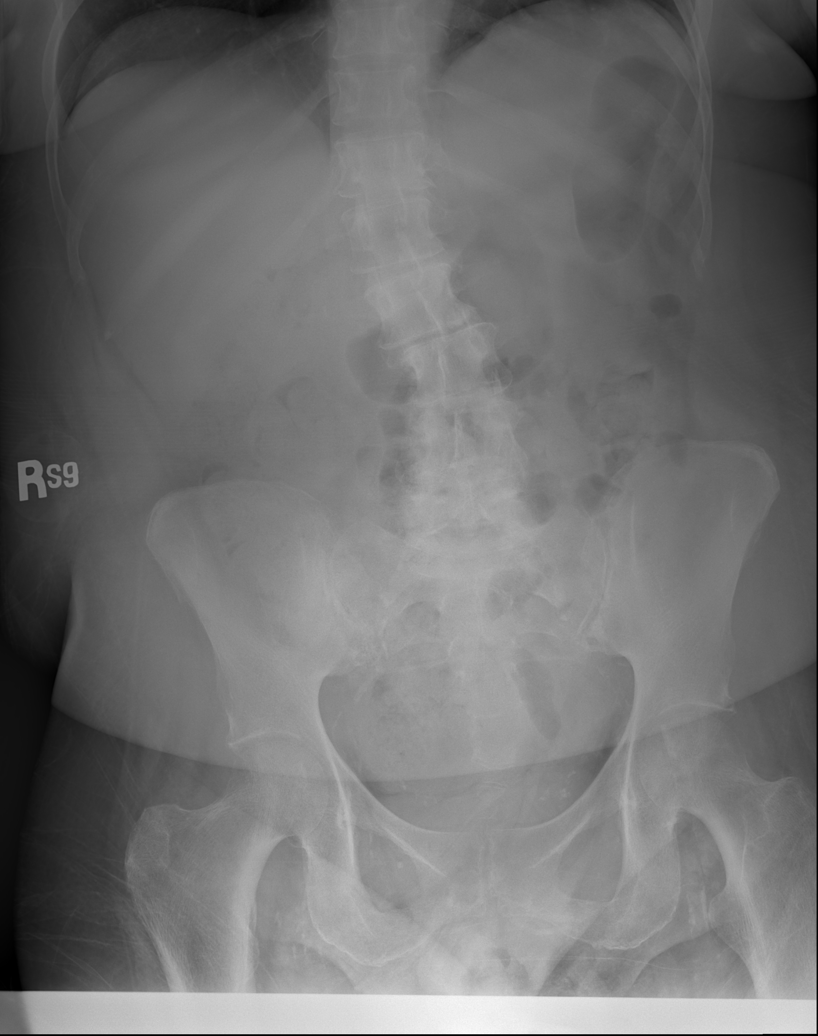

[4 of 4 positions shown; findings below may reference images not displayed]

FINDINGS: Heart and mediastinal contours are within normal limits.
No focal opacities or effusions.  No acute bony abnormality.

Moderate stool burden throughout the colon. There is normal bowel
gas pattern.  No free air.  No organomegaly or suspicious
calcification.  No acute bony abnormality.  Leftward scoliosis and
degenerative changes in the lumbar spine.
IMPRESSION: Moderate stool burden.  No acute findings.

## 2013-03-19 ENCOUNTER — Other Ambulatory Visit: Payer: Self-pay | Admitting: Neurology

## 2013-04-18 ENCOUNTER — Other Ambulatory Visit: Payer: Self-pay | Admitting: Neurology

## 2013-04-21 ENCOUNTER — Other Ambulatory Visit: Payer: Self-pay

## 2013-04-22 ENCOUNTER — Other Ambulatory Visit: Payer: Self-pay

## 2013-04-25 ENCOUNTER — Other Ambulatory Visit: Payer: Self-pay | Admitting: Neurology

## 2013-05-08 ENCOUNTER — Encounter: Payer: Self-pay | Admitting: Nurse Practitioner

## 2013-05-14 ENCOUNTER — Ambulatory Visit (INDEPENDENT_AMBULATORY_CARE_PROVIDER_SITE_OTHER): Payer: Medicare Other | Admitting: Nurse Practitioner

## 2013-05-14 ENCOUNTER — Encounter: Payer: Self-pay | Admitting: Nurse Practitioner

## 2013-05-14 VITALS — BP 114/72 | HR 78

## 2013-05-14 DIAGNOSIS — R269 Unspecified abnormalities of gait and mobility: Secondary | ICD-10-CM | POA: Insufficient documentation

## 2013-05-14 DIAGNOSIS — G35 Multiple sclerosis: Secondary | ICD-10-CM

## 2013-05-14 MED ORDER — BACLOFEN 10 MG PO TABS: 5.0000 mg | ORAL_TABLET | Freq: Three times a day (TID) | ORAL | Status: DC

## 2013-05-14 NOTE — Progress Notes (Signed)
[facility]  PATIENT: JANNELLE NOTARO DOB: 02/07/1956   REASON FOR VISIT: Followup for multiple sclerosis   HISTORY OF PRESENT ILLNESS: Ms.Lahey, 57 year old female returns for followup. She was last seen in our office 01/10/2012. At that time she was on Avonex weekly however she claims she has been off the medication for about a year because she was tired of taking shots. She also had a hospitalization for cellulitis and was in rehabilitation. She continues to live with her mom and she has an aide 3 hours 7 days a week. She has a power chair and she transfers with a Nurse, adult. She is nonambulatory. 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 Discussed oral medications with her.   HISTORY:   She was evaluated for multiple sclerosis in September 2005.She was seen back in 2001 by Dr. Tomasa Rand for visual loss in the left eye,  she was then referred to Dr. 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  MRI of the brain revealed small ovoid lesion but  the scan was felt not to demonstrate evidence of demyelinating disease.  The patient declined treatment with steroids and was lost to followup. When evaluated by Dr. Thad Ranger in 2005, she had lost her ability to hold a job and needed  long-term disability. Her problems at that time were inability to walk and balance issues.She returns today in a motorized chair that she borrowed from a friend. She continues to have significant left leg weakness and right lower leg extremity weakness. She cannot ambulate without the assistance of another person.  She denies any double vision, blurred vision ,  and speech or swallowing difficulties  no loss of bowel or bladder control. 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 Evaluation for power chair at last visit and it is beneficial for her not only in the home but getting out for short distances.She continues to have edema of both lower extremities. No new  complaints. See ROS    REVIEW OF SYSTEMS: Full 14 system review of systems performed and notable only for:  Constitutional: N/A  Cardiovascular: Swelling in the legs Ear/Nose/Throat: N/A  Skin: N/A  Eyes: N/A  Respiratory: N/A  Gastroitestinal: N/A  Hematology/Lymphatic: N/A  Endocrine: N/A Musculoskeletal:N/A  Allergy/Immunology: N/A  Neurological: N/A Psychiatric: N/A   ALLERGIES: Allergies  Allergen Reactions  . Sulfa Antibiotics Shortness Of Breath    HOME MEDICATIONS: Outpatient Prescriptions Prior to Visit  Medication Sig Dispense Refill  . baclofen (LIORESAL) 10 MG tablet TAKE 0.5 TABLETS (5 MG TOTAL) BY MOUTH 3 (THREE) TIMES DAILY.  45 tablet  0  . diphenhydramine-acetaminophen (TYLENOL PM) 25-500 MG TABS Take 1-2 tablets by mouth at bedtime as needed. For pain.      . ferrous sulfate 325 (65 FE) MG tablet Take 1 tablet (325 mg total) by mouth daily with breakfast.  30 tablet  3  . HYDROcodone-acetaminophen (NORCO/VICODIN) 5-325 MG per tablet Take 1 tablet by mouth every 6 (six) hours as needed. For pain.      . metoprolol tartrate (LOPRESSOR) 25 MG tablet Take 1 tablet (25 mg total) by mouth 2 (two) times daily.  60 tablet  3  . olmesartan-hydrochlorothiazide (BENICAR HCT) 20-12.5 MG per tablet Take 1 tablet by mouth daily.       No facility-administered medications prior to visit.    PAST MEDICAL HISTORY: Past Medical History  Diagnosis Date  . MS (multiple sclerosis)   . HTN (hypertension)   .  DM II (diabetes mellitus, type II), controlled   . Lymphedema   . Anemia   . Gait disorder     PAST SURGICAL HISTORY: History reviewed. No pertinent past surgical history.  FAMILY HISTORY: Family History  Problem Relation Age of Onset  . Multiple sclerosis Mother     SOCIAL HISTORY: History   Social History  . Marital Status: Single    Spouse Name: N/A    Number of Children: 3  . Years of Education: N/A   Occupational History  .     Social History  Main Topics  . Smoking status: Former Smoker    Types: Cigarettes  . Smokeless tobacco: Never Used  . Alcohol Use: No  . Drug Use: No  . Sexual Activity: No   Other Topics Concern  . Not on file   Social History Narrative   Patient is single.   Patient has three children.   Patient is disabled.   Patient is right-handed.   Patient does not drink any caffeine.     PHYSICAL EXAM  Filed Vitals:   05/14/13 1335  BP: 114/72  Pulse: 78   Cannot calculate BMI with a height equal to zero.  Generalized: Well developed, in no acute distress     Neurological examination   Mentation: Alert oriented to time, place, history taking. Follows all commands speech and language fluent  Cranial nerve II-XII: Pupils were equal round reactive to light extraocular movements partial INO with left gaze. Visual fields were full on confrontational test. Facial sensation and strength were normal. hearing was intact to finger rubbing bilaterally. Uvula tongue midline. head turning and shoulder shrug and were normal and symmetric.Tongue protrusion into cheek strength was normal. Motor: normal bulk and tone, full strength in the BUE, Increased  tone in lower extremities with weak hip flexors dorsiflexors and knee extensors in the 3/5 range Sensory: Decreased vibratory to both feet decreased pinprick to mid shin Coordination: finger-nose-finger,  no dysmetria, unable to perform heel to shin Reflexes: Brisker upper and lower, toes upgoing Gait and Station: In power chair not ambulated    DIAGNOSTIC DATA (LABS, IMAGING, TESTING) -None to review -None to review   ASSESSMENT AND PLAN  57 y.o. year old female  has a past medical history of MS (multiple sclerosis); HTN (hypertension); DM II (diabetes mellitus, type II), controlled; Lymphedema; Anemia; and Gait disorder. here for followup. Has been off Avonex for over a year.   Continue baclofen at current dose Discussed oral medications for MS and  patient is not interested in any therapy at this time F/U yearly Nilda Riggs, [facility], [facility], APRN  [facility] [address], [address] [place], [place] [address] [phone]

## 2013-05-14 NOTE — Patient Instructions (Signed)
Continue baclofen at current dose Discussed oral medications for MS and patient is not interested F/U yearly

## 2014-05-15 ENCOUNTER — Other Ambulatory Visit: Payer: Self-pay | Admitting: Nurse Practitioner

## 2014-06-13 ENCOUNTER — Other Ambulatory Visit: Payer: Self-pay | Admitting: Neurology

## 2014-06-25 ENCOUNTER — Ambulatory Visit (INDEPENDENT_AMBULATORY_CARE_PROVIDER_SITE_OTHER): Payer: Medicare Other | Admitting: Nurse Practitioner

## 2014-06-25 ENCOUNTER — Encounter: Payer: Self-pay | Admitting: Nurse Practitioner

## 2014-06-25 VITALS — BP 122/67 | HR 83

## 2014-06-25 DIAGNOSIS — R269 Unspecified abnormalities of gait and mobility: Secondary | ICD-10-CM

## 2014-06-25 DIAGNOSIS — G35 Multiple sclerosis: Secondary | ICD-10-CM

## 2014-06-25 NOTE — Patient Instructions (Signed)
Continue Baclofen for spasticity this is refilled  by PCP Patient now has CAP services to assist with ADLs No further follow-up planned

## 2014-06-25 NOTE — Progress Notes (Signed)
[facility]  PATIENT: Jocelyn Sanchez DOB: 1955/06/23   REASON FOR VISIT: Follow-up for history of multiple sclerosis  HISTORY OF PRESENT ILLNESS:Jocelyn Sanchez, 59 year old female returns for followup. She was last seen in our office 05/14/13. She has been off Avonex the medication for 2 years because  she was tired of taking shots. She also had a hospitalization for cellulitis and was in rehabilitation in 2014.  She continues to live with her mom and she has CAP services 7days/week 6 hours day.  She has a power chair and she transfers with a [employer] fast streamer. She is nonambulatory. 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 We have discussed other preparations for MS however patient is emphatic that she does not want to be on medication .   HISTORY: She was evaluated for multiple sclerosis in September 2005.She was seen back in 2001 by Dr. Candis Schatz for visual loss in the left eye, she was then referred to Dr. 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 MRI of the brain revealed small ovoid lesion but the scan was felt not to demonstrate evidence of demyelinating disease. The patient declined treatment with steroids and was lost to followup. When evaluated by Dr. Doy Mince in 2005, she had lost her ability to hold a job and needed long-term disability. Her problems at that time were inability to walk and balance issues.She returns today in a motorized chair that she borrowed from a friend. She continues to have significant left leg weakness and right lower leg extremity weakness. She cannot ambulate without the assistance of another person. She denies any double vision, blurred vision , and speech or swallowing difficulties no loss of bowel or bladder control. 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 Evaluation for power chair at last visit and it is beneficial for her not only in the home but getting out for  short distances.She continues to have edema of both lower extremities. No new complaints. See ROS   REVIEW OF SYSTEMS: Full 14 system review of systems performed and notable only for those listed, all others are neg:  Constitutional: N/A  Cardiovascular: N/A  Ear/Nose/Throat: N/A  Skin: N/A  Eyes: N/A  Respiratory: N/A  Gastroitestinal: N/A  Hematology/Lymphatic: N/A   Endocrine: N/A Musculoskeletal Nonambulatory  Allergy/Immunology: N/A  Neurological: N/A Psychiatric: N/A Sleep : NA   ALLERGIES: Allergies  Allergen Reactions  . Sulfa Antibiotics Shortness Of Breath    HOME MEDICATIONS: Outpatient Prescriptions Prior to Visit  Medication Sig Dispense Refill  . baclofen (LIORESAL) 10 MG tablet Take 0.5 tablets (5 mg total) by mouth 3 (three) times daily. 45 tablet 11  . diphenhydramine-acetaminophen (TYLENOL PM) 25-500 MG TABS Take 1-2 tablets by mouth at bedtime as needed. For pain.    Marland Kitchen glipiZIDE (GLUCOTROL) 5 MG tablet     . HYDROcodone-acetaminophen (NORCO/VICODIN) 5-325 MG per tablet Take 1 tablet by mouth every 6 (six) hours as needed. For pain.    Marland Kitchen olmesartan-hydrochlorothiazide (BENICAR HCT) 20-12.5 MG per tablet Take 1 tablet by mouth daily.    . baclofen (LIORESAL) 10 MG tablet TAKE 0.5 TABLETS (5 MG TOTAL) BY MOUTH 3 (THREE) TIMES DAILY 20 tablet 0   No facility-administered medications prior to visit.    PAST MEDICAL HISTORY: Past Medical History  Diagnosis Date  . MS (multiple sclerosis)   . HTN (hypertension)   . DM II (diabetes mellitus, type II), controlled   . Lymphedema   . Anemia   .  Gait disorder     PAST SURGICAL HISTORY: History reviewed. No pertinent past surgical history.  FAMILY HISTORY: Family History  Problem Relation Age of Onset  . Multiple sclerosis Mother     SOCIAL HISTORY: History   Social History  . Marital Status: Single    Spouse Name: N/A    Number of Children: 3  . Years of Education: N/A   Occupational History    .     Social History Main Topics  . Smoking status: Former Smoker    Types: Cigarettes  . Smokeless tobacco: Never Used  . Alcohol Use: No  . Drug Use: No  . Sexual Activity: No   Other Topics Concern  . Not on file   Social History Narrative   Patient is single.   Patient has three children.   Patient is disabled.   Patient is right-handed.   Patient does not drink any caffeine.     PHYSICAL EXAM  Filed Vitals:   Cannot calculate BMI with a height equal to zero. Generalized: Well developed, in no acute distress    Neurological examination   Mentation: Alert oriented to time, place, history taking. Follows all commands speech and language fluent  Cranial nerve II-XII: Pupils were equal round reactive to light extraocular movements partial INO with left gaze. Visual fields were full on confrontational test. Facial sensation and strength were normal. hearing was intact to finger rubbing bilaterally. Uvula tongue midline. head turning and shoulder shrug and were normal and symmetric.Tongue protrusion into cheek strength was normal. Motor: normal bulk and tone, full strength in the BUE, Increased tone in lower extremities with weak hip flexors dorsiflexors and knee extensors in the 2/5 range Sensory: Decreased vibratory to both feet decreased pinprick to mid shin Coordination: finger-nose-finger, no dysmetria, unable to perform heel to shin Reflexes: Brisker upper and lower, toes upgoing Gait and Station: In power chair not ambulated   DIAGNOSTIC DATA (LABS, IMAGING, TESTING) -   ASSESSMENT AND PLAN  59 y.o. year old female  has a past medical history of MS (multiple sclerosis); HTN (hypertension); DM II (diabetes mellitus, type II), controlled; Lymphedema; Anemia; and Gait disorder. hereto follow-up. She has been off her Avonex for over 2 years  Continue  Baclofen for spasticity this is refilled  by PCP Patient now has CAP services to assist with ADLs No further  follow-up planned   Patient does not want to be on any type of MS medication at this time  Jocelyn Sanchez, [facility], [facility], [facility] [address] [place], [place] [address] [phone]

## 2014-06-26 NOTE — Progress Notes (Signed)
I agree with assessment and plan as outlined by CM, GNP.   Aicia Babinski, MD

## 2014-06-27 ENCOUNTER — Other Ambulatory Visit: Payer: Self-pay | Admitting: Neurology

## 2014-06-27 NOTE — Telephone Encounter (Signed)
Lst OV note says: Continue Baclofen for spasticity this is refilled by PCP

## 2014-10-24 ENCOUNTER — Encounter (HOSPITAL_COMMUNITY): Payer: Self-pay | Admitting: Emergency Medicine

## 2014-10-24 ENCOUNTER — Emergency Department (HOSPITAL_COMMUNITY)
Admission: EM | Admit: 2014-10-24 | Discharge: 2014-10-24 | Disposition: A | Discharge status: Home / Self Care | Payer: Medicare Other | Attending: Emergency Medicine | Admitting: Emergency Medicine

## 2014-10-24 DIAGNOSIS — M62838 Other muscle spasm: Secondary | ICD-10-CM | POA: Insufficient documentation

## 2014-10-24 DIAGNOSIS — I10 Essential (primary) hypertension: Secondary | ICD-10-CM | POA: Diagnosis not present

## 2014-10-24 DIAGNOSIS — I890 Lymphedema, not elsewhere classified: Secondary | ICD-10-CM | POA: Insufficient documentation

## 2014-10-24 DIAGNOSIS — Z87891 Personal history of nicotine dependence: Secondary | ICD-10-CM | POA: Diagnosis not present

## 2014-10-24 DIAGNOSIS — Z79899 Other long term (current) drug therapy: Secondary | ICD-10-CM | POA: Diagnosis not present

## 2014-10-24 DIAGNOSIS — Z862 Personal history of diseases of the blood and blood-forming organs and certain disorders involving the immune mechanism: Secondary | ICD-10-CM | POA: Insufficient documentation

## 2014-10-24 DIAGNOSIS — E119 Type 2 diabetes mellitus without complications: Secondary | ICD-10-CM | POA: Diagnosis not present

## 2014-10-24 DIAGNOSIS — M549 Dorsalgia, unspecified: Secondary | ICD-10-CM | POA: Diagnosis present

## 2014-10-24 DIAGNOSIS — Z8669 Personal history of other diseases of the nervous system and sense organs: Secondary | ICD-10-CM | POA: Diagnosis not present

## 2014-10-24 DIAGNOSIS — R000 Tachycardia, unspecified: Secondary | ICD-10-CM | POA: Diagnosis not present

## 2014-10-24 LAB — CBG MONITORING, ED: Glucose-Capillary: 171 mg/dL — ABNORMAL HIGH (ref 70–99)

## 2014-10-24 MED ORDER — HYDROCODONE-ACETAMINOPHEN 5-325 MG PO TABS: 2.0000 | ORAL_TABLET | ORAL | Status: DC | PRN

## 2014-10-24 MED ORDER — OXYCODONE-ACETAMINOPHEN 5-325 MG PO TABS
2.0000 | ORAL_TABLET | Freq: Once | ORAL | Status: AC
  Administered 2014-10-24: 2 via ORAL
  Filled 2014-10-24: qty 2

## 2014-10-24 MED ORDER — METHOCARBAMOL 500 MG PO TABS: 500.0000 mg | ORAL_TABLET | Freq: Two times a day (BID) | ORAL | Status: DC | PRN

## 2014-10-24 MED ORDER — METHOCARBAMOL 500 MG PO TABS
500.0000 mg | ORAL_TABLET | Freq: Once | ORAL | Status: AC
  Administered 2014-10-24: 500 mg via ORAL
  Filled 2014-10-24: qty 1

## 2014-10-24 NOTE — ED Notes (Signed)
Awake. Verbally responsive. A/O x4. Resp even and unlabored. No audible adventitious breath sounds noted. ABC's intact. Family at bedside. 

## 2014-10-24 NOTE — ED Notes (Signed)
Communications called to find out ETA for transport home for pt and reported that she is the next one to be transported from this facility.

## 2014-10-24 NOTE — ED Provider Notes (Signed)
CSN: [id number]     Arrival date & time 10/24/14  1306 History   First MD Initiated Contact with Patient 10/24/14 1322     Chief Complaint  Patient presents with  . Back Pain     (Consider location/radiation/quality/duration/timing/severity/associated sxs/prior Treatment) HPI Comments: 59 year old female, history of multiple sclerosis, hypertension, diabetes and severe bilateral lower extremity lymphedema. She gets around in a wheelchair, she has had ongoing back pain and muscle spasms for which she takes baclofen at home. She reports that over the last 24 hours since last night she has developed increasing pain in her left upper extremity at her shoulder which creates an environment where she is unable to move her left upper extremity because of pain in the shoulder down to the elbow. She has no numbness or weakness of that arm, she does have chronic numbness of her bilateral lower extremities. She denies any changes in her lower extremities which are chronically swollen, denies fevers chills coughing shortness of breath dysuria or diarrhea.  Patient is a 59 y.o. female presenting with back pain. The history is provided by the patient.  Back Pain   Past Medical History  Diagnosis Date  . MS (multiple sclerosis)   . HTN (hypertension)   . DM II (diabetes mellitus, type II), controlled   . Lymphedema   . Anemia   . Gait disorder    History reviewed. No pertinent past surgical history. Family History  Problem Relation Age of Onset  . Multiple sclerosis Mother    History  Substance Use Topics  . Smoking status: Former Smoker    Types: Cigarettes  . Smokeless tobacco: Never Used  . Alcohol Use: No   OB History    Gravida Para Term Preterm AB TAB SAB Ectopic Multiple Living   3 3 0 0 0 0 0 0 0 0      Review of Systems  Musculoskeletal: Positive for back pain.  All other systems reviewed and are negative.     Allergies  Sulfa antibiotics  Home Medications   Prior to  Admission medications   Medication Sig Start Date End Date Taking? Authorizing Provider  baclofen (LIORESAL) 10 MG tablet Take 0.5 tablets (5 mg total) by mouth 3 (three) times daily. 05/14/13  Yes Otilio Jefferson, NP  glipiZIDE (GLUCOTROL) 5 MG tablet Take 5 mg by mouth 2 (two) times daily.  05/12/13  Yes Historical Provider, MD  ibuprofen (ADVIL,MOTRIN) 200 MG tablet Take 800 mg by mouth every 4 (four) hours as needed for fever, headache, mild pain, moderate pain or cramping.   Yes Historical Provider, MD  olmesartan-hydrochlorothiazide (BENICAR HCT) 20-12.5 MG per tablet Take 1 tablet by mouth daily.   Yes Historical Provider, MD  HYDROcodone-acetaminophen (NORCO/VICODIN) 5-325 MG per tablet Take 2 tablets by mouth every 4 (four) hours as needed. 10/24/14   Noemi Chapel, MD  methocarbamol (ROBAXIN) 500 MG tablet Take 1 tablet (500 mg total) by mouth 2 (two) times daily as needed for muscle spasms. 10/24/14   Noemi Chapel, MD   BP 128/65 mmHg  Pulse 116  Temp(Src) 98 F (36.7 C) (Oral)  Resp 16  Ht 5\' 3"  (1.6 m)  Wt 163 lb (73.936 kg)  BMI 28.88 kg/m2  SpO2 100% Physical Exam  Constitutional: She appears well-developed and well-nourished. No distress.  HENT:  Head: Normocephalic and atraumatic.  Mouth/Throat: Oropharynx is clear and moist. No oropharyngeal exudate.  Eyes: Conjunctivae and EOM are normal. Pupils are equal, round, and reactive to light. Right  eye exhibits no discharge. Left eye exhibits no discharge. No scleral icterus.  Neck: Normal range of motion. Neck supple. No JVD present. No thyromegaly present.  Cardiovascular: Regular rhythm, normal heart sounds and intact distal pulses.  Exam reveals no gallop and no friction rub.   No murmur heard. Tachycardia at 110 bpm  Pulmonary/Chest: Effort normal and breath sounds normal. No respiratory distress. She has no wheezes. She has no rales.  Abdominal: Soft. Bowel sounds are normal. She exhibits no distension and no mass. There is  no tenderness.  Musculoskeletal: Normal range of motion. She exhibits edema (severe bilateral lower extremity lymphedema) and tenderness (tenderness of the left upper extremity in the biceps muscle and the deltoid muscle, decreased range of motion secondary to muscle spasm).  Lymphadenopathy:    She has no cervical adenopathy.  Neurological: She is alert. Coordination normal.  Normal speech, normal grips of the bilateral upper extremities, unable to lift the lower extremities off the bed, baseline for her  Skin: Skin is warm and dry. No rash noted. No erythema.  Psychiatric: She has a normal mood and affect. Her behavior is normal.  Nursing note and vitals reviewed.   ED Course  Procedures (including critical care time) Labs Review Labs Reviewed  CBG MONITORING, ED - Abnormal; Notable for the following:    Glucose-Capillary 171 (*)    All other components within normal limits    Imaging Review No results found.    MDM   Final diagnoses:  Muscle spasm  Left-sided back pain, unspecified location    The patient has a mild tachycardia, she will be given pain medication, muscle relaxer, check CBG, she otherwise appears well and has no other complaints.  CBG  Normal - pt low risk for other acute illness to the LUE - cramps- doubt DVT, no skin changes to cause cellulitis or zoster - improved with meds.  Meds given in ED:  Medications  oxyCODONE-acetaminophen (PERCOCET/ROXICET) 5-325 MG per tablet 2 tablet (2 tablets Oral Given 10/24/14 1356)  methocarbamol (ROBAXIN) tablet 500 mg (500 mg Oral Given 10/24/14 1357)    New Prescriptions   HYDROCODONE-ACETAMINOPHEN (NORCO/VICODIN) 5-325 MG PER TABLET    Take 2 tablets by mouth every 4 (four) hours as needed.   METHOCARBAMOL (ROBAXIN) 500 MG TABLET    Take 1 tablet (500 mg total) by mouth 2 (two) times daily as needed for muscle spasms.      Noemi Chapel, MD 10/24/14 1447

## 2014-10-24 NOTE — ED Notes (Signed)
Bed: RF16 Expected date: 10/24/14 Expected time: 1:09 PM Means of arrival: Ambulance Comments: Back pain, hx of MS

## 2014-10-24 NOTE — Discharge Instructions (Signed)
Please call your doctor for a followup appointment within 24-48 hours. When you talk to your doctor please let them know that you were seen in the emergency department and have them acquire all of your records so that they can discuss the findings with you and formulate a treatment plan to fully care for your new and ongoing problems. ° °

## 2014-10-24 NOTE — ED Notes (Signed)
Pt arrived via EMS with report of lt lower side back pain and lt hand stiffness. Pt denies injury/trauma. (+)pulses in lt pedal/radial and sensation. CRT brisk. Pt reported that the pain was intermittent muscle spasms

## 2014-10-24 NOTE — ED Notes (Signed)
[facility] arrived to transport pt back home.

## 2014-11-28 ENCOUNTER — Encounter (HOSPITAL_COMMUNITY): Payer: Self-pay | Admitting: Emergency Medicine

## 2014-11-28 ENCOUNTER — Emergency Department (HOSPITAL_COMMUNITY): Payer: Medicare Other

## 2014-11-28 ENCOUNTER — Inpatient Hospital Stay (HOSPITAL_COMMUNITY)
Admission: EM | Admit: 2014-11-28 | Discharge: 2014-12-04 | DRG: 637 | Disposition: A | Discharge status: Home Health | Payer: Medicare Other | Attending: Cardiovascular Disease | Admitting: Cardiovascular Disease

## 2014-11-28 DIAGNOSIS — I10 Essential (primary) hypertension: Secondary | ICD-10-CM | POA: Diagnosis present

## 2014-11-28 DIAGNOSIS — G35 Multiple sclerosis: Secondary | ICD-10-CM | POA: Diagnosis present

## 2014-11-28 DIAGNOSIS — Z79899 Other long term (current) drug therapy: Secondary | ICD-10-CM | POA: Diagnosis not present

## 2014-11-28 DIAGNOSIS — Z6831 Body mass index (BMI) 31.0-31.9, adult: Secondary | ICD-10-CM | POA: Diagnosis not present

## 2014-11-28 DIAGNOSIS — B9689 Other specified bacterial agents as the cause of diseases classified elsewhere: Secondary | ICD-10-CM | POA: Diagnosis present

## 2014-11-28 DIAGNOSIS — I890 Lymphedema, not elsewhere classified: Secondary | ICD-10-CM | POA: Diagnosis present

## 2014-11-28 DIAGNOSIS — E876 Hypokalemia: Secondary | ICD-10-CM | POA: Diagnosis present

## 2014-11-28 DIAGNOSIS — Z882 Allergy status to sulfonamides status: Secondary | ICD-10-CM | POA: Diagnosis not present

## 2014-11-28 DIAGNOSIS — M7989 Other specified soft tissue disorders: Secondary | ICD-10-CM | POA: Diagnosis present

## 2014-11-28 DIAGNOSIS — Z820 Family history of epilepsy and other diseases of the nervous system: Secondary | ICD-10-CM

## 2014-11-28 DIAGNOSIS — A419 Sepsis, unspecified organism: Secondary | ICD-10-CM | POA: Diagnosis present

## 2014-11-28 DIAGNOSIS — E11649 Type 2 diabetes mellitus with hypoglycemia without coma: Secondary | ICD-10-CM | POA: Diagnosis present

## 2014-11-28 DIAGNOSIS — R319 Hematuria, unspecified: Secondary | ICD-10-CM | POA: Diagnosis present

## 2014-11-28 DIAGNOSIS — T80818A Extravasation of other vesicant agent, initial encounter: Secondary | ICD-10-CM | POA: Diagnosis present

## 2014-11-28 DIAGNOSIS — D509 Iron deficiency anemia, unspecified: Secondary | ICD-10-CM | POA: Diagnosis present

## 2014-11-28 DIAGNOSIS — L989 Disorder of the skin and subcutaneous tissue, unspecified: Secondary | ICD-10-CM | POA: Diagnosis present

## 2014-11-28 DIAGNOSIS — N390 Urinary tract infection, site not specified: Secondary | ICD-10-CM | POA: Diagnosis present

## 2014-11-28 DIAGNOSIS — R4182 Altered mental status, unspecified: Secondary | ICD-10-CM

## 2014-11-28 DIAGNOSIS — G8220 Paraplegia, unspecified: Secondary | ICD-10-CM | POA: Diagnosis present

## 2014-11-28 DIAGNOSIS — T68XXXA Hypothermia, initial encounter: Secondary | ICD-10-CM

## 2014-11-28 DIAGNOSIS — Y92239 Unspecified place in hospital as the place of occurrence of the external cause: Secondary | ICD-10-CM | POA: Diagnosis not present

## 2014-11-28 DIAGNOSIS — Z7401 Bed confinement status: Secondary | ICD-10-CM

## 2014-11-28 DIAGNOSIS — Z87891 Personal history of nicotine dependence: Secondary | ICD-10-CM

## 2014-11-28 DIAGNOSIS — Y848 Other medical procedures as the cause of abnormal reaction of the patient, or of later complication, without mention of misadventure at the time of the procedure: Secondary | ICD-10-CM | POA: Diagnosis present

## 2014-11-28 LAB — GLUCOSE, CAPILLARY
GLUCOSE-CAPILLARY: 49 mg/dL — AB (ref 65–99)
GLUCOSE-CAPILLARY: 134 mg/dL — AB (ref 65–99)
GLUCOSE-CAPILLARY: 135 mg/dL — AB (ref 65–99)
Glucose-Capillary: 187 mg/dL — ABNORMAL HIGH (ref 65–99)

## 2014-11-28 LAB — T4, FREE: Free T4: 0.93 ng/dL (ref 0.61–1.12)

## 2014-11-28 LAB — TSH: TSH: 1.886 u[IU]/mL (ref 0.350–4.500)

## 2014-11-28 LAB — CBC WITH DIFFERENTIAL/PLATELET
BASOS ABS: 0 10*3/uL (ref 0.0–0.1)
BASOS PCT: 0 % (ref 0–1)
EOS PCT: 1 % (ref 0–5)
Eosinophils Absolute: 0.1 10*3/uL (ref 0.0–0.7)
HEMATOCRIT: 32.7 % — AB (ref 36.0–46.0)
Hemoglobin: 10.9 g/dL — ABNORMAL LOW (ref 12.0–15.0)
LYMPHS PCT: 12 % (ref 12–46)
Lymphs Abs: 1.1 10*3/uL (ref 0.7–4.0)
MCH: 27 pg (ref 26.0–34.0)
MCHC: 33.3 g/dL (ref 30.0–36.0)
MCV: 81.1 fL (ref 78.0–100.0)
Monocytes Absolute: 0.6 10*3/uL (ref 0.1–1.0)
Monocytes Relative: 7 % (ref 3–12)
Neutro Abs: 7.5 10*3/uL (ref 1.7–7.7)
Neutrophils Relative %: 80 % — ABNORMAL HIGH (ref 43–77)
Platelets: 406 10*3/uL — ABNORMAL HIGH (ref 150–400)
RBC: 4.03 MIL/uL (ref 3.87–5.11)
RDW: 14.8 % (ref 11.5–15.5)
WBC: 9.4 10*3/uL (ref 4.0–10.5)

## 2014-11-28 LAB — COMPREHENSIVE METABOLIC PANEL
ALT: 17 U/L (ref 14–54)
AST: 19 U/L (ref 15–41)
Albumin: 3.5 g/dL (ref 3.5–5.0)
Alkaline Phosphatase: 79 U/L (ref 38–126)
Anion gap: 10 (ref 5–15)
BILIRUBIN TOTAL: 0.5 mg/dL (ref 0.3–1.2)
BUN: 77 mg/dL — ABNORMAL HIGH (ref 6–20)
CO2: 22 mmol/L (ref 22–32)
Calcium: 9.1 mg/dL (ref 8.9–10.3)
Chloride: 104 mmol/L (ref 101–111)
Creatinine, Ser: 0.98 mg/dL (ref 0.44–1.00)
GFR calc Af Amer: >60 mL/min (ref >60)
Glucose, Bld: 84 mg/dL (ref 65–99)
POTASSIUM: 3.6 mmol/L (ref 3.5–5.1)
SODIUM: 136 mmol/L (ref 135–145)
Total Protein: 8 g/dL (ref 6.5–8.1)

## 2014-11-28 LAB — URINE MICROSCOPIC-ADD ON

## 2014-11-28 LAB — URINALYSIS, ROUTINE W REFLEX MICROSCOPIC
Bilirubin Urine: NEGATIVE
Glucose, UA: NEGATIVE mg/dL
KETONES UR: NEGATIVE mg/dL
NITRITE: POSITIVE — AB
PH: 6 (ref 5.0–8.0)
Protein, ur: 30 mg/dL — AB
Specific Gravity, Urine: 1.015 (ref 1.005–1.030)
UROBILINOGEN UA: 0.2 mg/dL (ref 0.0–1.0)

## 2014-11-28 LAB — CBG MONITORING, ED: GLUCOSE-CAPILLARY: 83 mg/dL (ref 65–99)

## 2014-11-28 LAB — I-STAT CG4 LACTIC ACID, ED: Lactic Acid, Venous: 0.61 mmol/L (ref 0.5–2.0)

## 2014-11-28 MED ORDER — INSULIN ASPART 100 UNIT/ML ~~LOC~~ SOLN
0.0000 [IU] | Freq: Three times a day (TID) | SUBCUTANEOUS | Status: DC
  Administered 2014-11-29: 1 [IU] via SUBCUTANEOUS
  Administered 2014-11-29 – 2014-11-30 (×2): 2 [IU] via SUBCUTANEOUS
  Administered 2014-11-30: 1 [IU] via SUBCUTANEOUS
  Administered 2014-11-30 – 2014-12-01 (×3): 2 [IU] via SUBCUTANEOUS
  Administered 2014-12-01 – 2014-12-02 (×2): 1 [IU] via SUBCUTANEOUS
  Administered 2014-12-02 – 2014-12-03 (×3): 2 [IU] via SUBCUTANEOUS
  Administered 2014-12-03 (×2): 1 [IU] via SUBCUTANEOUS

## 2014-11-28 MED ORDER — DEXTROSE 50 % IV SOLN
INTRAVENOUS | Status: AC
  Administered 2014-11-28: 50 mL
  Filled 2014-11-28: qty 50

## 2014-11-28 MED ORDER — ASPIRIN EC 81 MG PO TBEC
81.0000 mg | DELAYED_RELEASE_TABLET | Freq: Every day | ORAL | Status: DC
  Administered 2014-11-28 – 2014-12-04 (×7): 81 mg via ORAL
  Filled 2014-11-28 (×7): qty 1

## 2014-11-28 MED ORDER — METHOCARBAMOL 500 MG PO TABS
500.0000 mg | ORAL_TABLET | Freq: Two times a day (BID) | ORAL | Status: DC | PRN
  Administered 2014-11-28 – 2014-12-03 (×3): 500 mg via ORAL
  Filled 2014-11-28 (×3): qty 1

## 2014-11-28 MED ORDER — DEXTROSE 5 % IV SOLN
1.0000 g | Freq: Once | INTRAVENOUS | Status: AC
  Administered 2014-11-28: 1 g via INTRAVENOUS
  Filled 2014-11-28: qty 10

## 2014-11-28 MED ORDER — BACLOFEN 10 MG PO TABS
5.0000 mg | ORAL_TABLET | Freq: Three times a day (TID) | ORAL | Status: DC
  Administered 2014-11-28 – 2014-12-04 (×18): 5 mg via ORAL
  Filled 2014-11-28 (×18): qty 1

## 2014-11-28 MED ORDER — SODIUM CHLORIDE 0.9 % IV SOLN
INTRAVENOUS | Status: DC
  Administered 2014-11-28: 17:00:00 via INTRAVENOUS

## 2014-11-28 MED ORDER — CEFTRIAXONE SODIUM IN DEXTROSE 20 MG/ML IV SOLN
1.0000 g | INTRAVENOUS | Status: DC
  Administered 2014-11-29 – 2014-12-01 (×3): 1 g via INTRAVENOUS
  Filled 2014-11-28 (×6): qty 50

## 2014-11-28 MED ORDER — SODIUM CHLORIDE 0.9 % IV BOLUS (SEPSIS)
500.0000 mL | Freq: Once | INTRAVENOUS | Status: AC
  Administered 2014-11-28: 500 mL via INTRAVENOUS

## 2014-11-28 MED ORDER — HEPARIN SODIUM (PORCINE) 5000 UNIT/ML IJ SOLN
5000.0000 [IU] | Freq: Three times a day (TID) | INTRAMUSCULAR | Status: DC
Start: 2014-11-28 — End: 2014-12-04
  Administered 2014-11-28 – 2014-12-04 (×15): 5000 [IU] via SUBCUTANEOUS
  Filled 2014-11-28 (×15): qty 1

## 2014-11-28 NOTE — ED Provider Notes (Signed)
CSN: [id number]     Arrival date & time 11/28/14  5621 History   First MD Initiated Contact with Patient 11/28/14 1023     Chief Complaint  Patient presents with  . Hypoglycemia     (Consider location/radiation/quality/duration/timing/severity/associated sxs/prior Treatment) Patient is a 59 y.o. female presenting with hypoglycemia. The history is provided by the patient.  Hypoglycemia Initial blood sugar:  38 Blood sugar after intervention:  76 Severity:  Mild Onset quality:  Sudden Timing:  Constant Progression:  Resolved Chronicity:  New Diabetic status:  Controlled with oral medications Context comment:  Unknown Relieved by:  Glucagon and eating Ineffective treatments:  None tried Associated symptoms: altered mental status   Associated symptoms: no dizziness, no shortness of breath and no vomiting     Past Medical History  Diagnosis Date  . MS (multiple sclerosis)   . HTN (hypertension)   . DM II (diabetes mellitus, type II), controlled   . Lymphedema   . Anemia   . Gait disorder    History reviewed. No pertinent past surgical history. Family History  Problem Relation Age of Onset  . Multiple sclerosis Mother    History  Substance Use Topics  . Smoking status: Former Smoker    Types: Cigarettes  . Smokeless tobacco: Never Used  . Alcohol Use: No   OB History    Gravida Para Term Preterm AB TAB SAB Ectopic Multiple Living   3 3 0 0 0 0 0 0 0 0      Review of Systems  Constitutional: Negative for fever and fatigue.  HENT: Negative for congestion and drooling.   Eyes: Negative for pain.  Respiratory: Positive for cough. Negative for shortness of breath.   Cardiovascular: Negative for chest pain.  Gastrointestinal: Negative for nausea, vomiting, abdominal pain and diarrhea.  Genitourinary: Negative for dysuria and hematuria.  Musculoskeletal: Negative for back pain, gait problem and neck pain.  Skin: Negative for color change.  Neurological: Negative for  dizziness and headaches.  Hematological: Negative for adenopathy.  Psychiatric/Behavioral: Negative for behavioral problems.  All other systems reviewed and are negative.     Allergies  Sulfa antibiotics  Home Medications   Prior to Admission medications   Medication Sig Start Date End Date Taking? Authorizing Provider  baclofen (LIORESAL) 10 MG tablet Take 0.5 tablets (5 mg total) by mouth 3 (three) times daily. 05/14/13   Dennie Bible, NP  glipiZIDE (GLUCOTROL) 5 MG tablet Take 5 mg by mouth 2 (two) times daily.  05/12/13   Historical Provider, MD  HYDROcodone-acetaminophen (NORCO/VICODIN) 5-325 MG per tablet Take 2 tablets by mouth every 4 (four) hours as needed. 10/24/14   Noemi Chapel, MD  ibuprofen (ADVIL,MOTRIN) 200 MG tablet Take 800 mg by mouth every 4 (four) hours as needed for fever, headache, mild pain, moderate pain or cramping.    Historical Provider, MD  methocarbamol (ROBAXIN) 500 MG tablet Take 1 tablet (500 mg total) by mouth 2 (two) times daily as needed for muscle spasms. 10/24/14   Noemi Chapel, MD  olmesartan-hydrochlorothiazide (BENICAR HCT) 20-12.5 MG per tablet Take 1 tablet by mouth daily.    Historical Provider, MD   BP 123/78 mmHg  Pulse 102  Temp(Src) 93.8 F (34.3 C) (Rectal)  Resp 16  Ht 5\' 3"  (1.6 m)  Wt 165 lb (74.844 kg)  BMI 29.24 kg/m2  SpO2 100% Physical Exam  Constitutional: She is oriented to person, place, and time. She appears well-developed and well-nourished.  HENT:  Head: Normocephalic.  Mouth/Throat: Oropharynx is clear and moist. No oropharyngeal exudate.  Eyes: Conjunctivae and EOM are normal. Pupils are equal, round, and reactive to light.  Neck: Normal range of motion. Neck supple.  Cardiovascular: Normal rate, regular rhythm, normal heart sounds and intact distal pulses.  Exam reveals no gallop and no friction rub.   No murmur heard. Pulmonary/Chest: Effort normal and breath sounds normal. No respiratory distress. She has  no wheezes.  Abdominal: Soft. Bowel sounds are normal. There is no tenderness. There is no rebound and no guarding.  Musculoskeletal: Normal range of motion. She exhibits no edema or tenderness.  Chronic appearing lymphedema in bilateral lower extremities.  Neurological: She is alert and oriented to person, place, and time.  alert, oriented x3 speech: normal in context and clarity memory: intact grossly cranial nerves II-XII: intact motor strength: full proximally and distally in UE's, 0/5 in LE's which is unchanged from baseline  sensation: intact to light touch diffusely  cerebellar: finger-to-nose intact gait: Nonambulatory baseline  Skin: Skin is warm and dry.  Psychiatric: She has a normal mood and affect. Her behavior is normal.  Nursing note and vitals reviewed.   ED Course  Procedures (including critical care time) Labs Review Labs Reviewed  CBC WITH DIFFERENTIAL/PLATELET - Abnormal; Notable for the following:    Hemoglobin 10.9 (*)    HCT 32.7 (*)    Platelets 406 (*)    Neutrophils Relative % 80 (*)    All other components within normal limits  COMPREHENSIVE METABOLIC PANEL - Abnormal; Notable for the following:    BUN 77 (*)    All other components within normal limits  URINALYSIS, ROUTINE W REFLEX MICROSCOPIC (NOT AT [facility]) - Abnormal; Notable for the following:    APPearance CLOUDY (*)    Hgb urine dipstick LARGE (*)    Protein, ur 30 (*)    Nitrite POSITIVE (*)    Leukocytes, UA LARGE (*)    All other components within normal limits  URINE MICROSCOPIC-ADD ON - Abnormal; Notable for the following:    Squamous Epithelial / LPF FEW (*)    Bacteria, UA MANY (*)    All other components within normal limits  GLUCOSE, CAPILLARY - Abnormal; Notable for the following:    Glucose-Capillary 49 (*)    All other components within normal limits  GLUCOSE, CAPILLARY - Abnormal; Notable for the following:    Glucose-Capillary 187 (*)    All other components within normal  limits  URINE CULTURE  CULTURE, BLOOD (ROUTINE X 2)  CULTURE, BLOOD (ROUTINE X 2)  TSH  T4, FREE  CBG MONITORING, ED  I-STAT CG4 LACTIC ACID, ED  I-STAT CG4 LACTIC ACID, ED    Imaging Review Dg Chest 2 View  11/28/2014   CLINICAL DATA:  Altered mental status. Ex-smoker. Multiple sclerosis. Hypertension. Diabetes.  EXAM: CHEST  2 VIEW  COMPARISON:  05/10/2011  FINDINGS: Lateral view degraded by patient arm position. artifact posteriorly on the lateral view as well. Midline trachea. Normal heart size and mediastinal contours. No pleural effusion or pneumothorax. Clear lungs.  IMPRESSION: No acute cardiopulmonary disease.   Electronically Signed   By: Abigail Miyamoto M.D.   On: 11/28/2014 12:12   Ct Head Wo Contrast  11/28/2014   CLINICAL DATA:  Slurred speech today. Hypoglycemia. History of type 2 diabetes. Initial encounter.  EXAM: CT HEAD WITHOUT CONTRAST  TECHNIQUE: Contiguous axial images were obtained from the base of the skull through the vertex without intravenous contrast.  COMPARISON:  None.  FINDINGS: There is no evidence of acute intracranial hemorrhage, mass lesion, brain edema or extra-axial fluid collection. The ventricles and subarachnoid spaces are prominent, especially within the posterior fossa. There is no CT evidence of acute cortical infarction. There is mild symmetric periventricular white matter disease, likely secondary to chronic small vessel ischemic change. Intracranial vascular calcifications are noted.  The visualized paranasal sinuses, mastoid air cells and middle ears are clear. The calvarium is intact.  IMPRESSION: No acute intracranial findings. Mild atrophy, primarily involving the cerebellar vermis. Mild chronic small vessel ischemic changes.   Electronically Signed   By: Richardean Sale M.D.   On: 11/28/2014 12:17     EKG Interpretation   Date/Time:  Saturday November 28 2014 11:18:54 EDT Ventricular Rate:  98 PR Interval:  161 QRS Duration: 84 QT Interval:   358 QTC Calculation: 457 R Axis:   -34 Text Interpretation:  Sinus rhythm Left axis deviation Low voltage,  precordial leads Abnormal R-wave progression, early transition Baseline  wander in lead(s) V5 No significant change since last tracing Confirmed by  Glennie Bose  MD, [place] ([id number]) on 11/28/2014 11:29:37 AM      MDM   Final diagnoses:  Altered mental status  UTI (lower urinary tract infection)  Hypothermia, initial encounter    10:50 AM 59 y.o. female with a history of MS, hypertension, diabetes on glipizide, nonambulatory at baseline due to chronic lower extremity lymphedema who presents with altered mental status. The complete story is not completely known. History is provided by the patient and her sister. The sister notes that the patient's mother first noticed that the patient was not acting right around 4 AM this morning. The patient appeared drowsy with some slurring of speech. The sister arrived around 8 to 8:30 AM and tried to give the patient candy, peanut butter. EMS was called. The patient's blood sugar was found to be 38. EMS gave oral glucagon and milk. Repeat CBG showed 76. The patient's symptoms have resolved on their way to the hospital. She is currently asymptomatic and feels fine. She has a temperature of 93.8 here and a heart rate of 102 but vital signs are otherwise unremarkable. I suspect her neurologic exam to be baseline. Family denies any recent illnesses. Symptoms likely related to hypoglycemia but this does not explain her temperature. We'll get screening labs and imaging.  Last took glipizide yesterday evening.  Patient's temperature is only increased to 94.3 after being under a bear hugger for 2 hours. I'm suspicious for urosepsis given the hypothermia and concurrent UTI. Will treat with Rocephin. Discussed case with Dr. Terrence Dupont (on call for [facility]) who will admit.     Pamella Pert, MD 11/28/14 1630

## 2014-11-28 NOTE — ED Notes (Signed)
Patient's family noticed this morning that patient had slurred speech.  Psychologist, occupational. Arrived and BS was 38.  IV not able to be obtained, EMS gave oral glucagon and milk.  CBG rechecked 76.  Alert and oriented now, had type 2 diabetes.  No recent history of hypoglycemic episodes.  Patient is wheelchair bound for MS.  Significant edema to bilateral lower extremites, weeping.

## 2014-11-28 NOTE — ED Notes (Signed)
Blood sugar 83

## 2014-11-28 NOTE — H&P (Signed)
Jocelyn Sanchez is an 59 y.o. female.   Chief Complaint: Altered mental status/hypoglycemia/UTI HPI: Patient is 59 year old female with past medical history significant for hypertension, diabetes mellitus, multiple sclerosis, chronic lymphedema with dermatosis nonambulatory, came to ER by EMS as patient was noted to be drowsy slurred speech and was noted to be hypoglycemic with blood sugar of 38 patient received oral glucagon and milk repeat blood sugar was 76 patient was also noted to be hypo-thermic with temperature of 93.49F and was mildly tachycardic patient was noted to also have sepsis received IV Rocephin in the ED. Patient was placed under Bair hugger for 2 hours with minimal improvement in her temperature. Patient doesn't they denies any chest pain or shortness of breath. Denies any urinary complaints although patient mostly bedbound.  Past Medical History  Diagnosis Date  . MS (multiple sclerosis)   . HTN (hypertension)   . DM II (diabetes mellitus, type II), controlled   . Lymphedema   . Anemia   . Gait disorder     History reviewed. No pertinent past surgical history.  Family History  Problem Relation Age of Onset  . Multiple sclerosis Mother    Social History:  reports that she has quit smoking. Her smoking use included Cigarettes. She has never used smokeless tobacco. She reports that she does not drink alcohol or use illicit drugs.  Allergies:  Allergies  Allergen Reactions  . Sulfa Antibiotics Shortness Of Breath    Medications Prior to Admission  Medication Sig Dispense Refill  . baclofen (LIORESAL) 10 MG tablet Take 0.5 tablets (5 mg total) by mouth 3 (three) times daily. 45 tablet 11  . diphenhydramine-acetaminophen (TYLENOL PM) 25-500 MG TABS Take 1 tablet by mouth at bedtime as needed (sleep/pain).    Marland Kitchen glipiZIDE (GLUCOTROL) 5 MG tablet Take 5 mg by mouth 2 (two) times daily.     Marland Kitchen HYDROcodone-acetaminophen (NORCO/VICODIN) 5-325 MG per tablet Take 2 tablets by  mouth every 4 (four) hours as needed. (Patient taking differently: Take 2 tablets by mouth every 4 (four) hours as needed for moderate pain. ) 10 tablet 0  . ibuprofen (ADVIL,MOTRIN) 200 MG tablet Take 800 mg by mouth every 4 (four) hours as needed for moderate pain.     . methocarbamol (ROBAXIN) 500 MG tablet Take 1 tablet (500 mg total) by mouth 2 (two) times daily as needed for muscle spasms. 20 tablet 0  . olmesartan-hydrochlorothiazide (BENICAR HCT) 20-12.5 MG per tablet Take 1 tablet by mouth daily.      Results for orders placed or performed during the hospital encounter of 11/28/14 (from the past 48 hour(s))  CBG monitoring, ED     Status: None   Collection Time: 11/28/14 10:06 AM  Result Value Ref Range   Glucose-Capillary 83 65 - 99 mg/dL  CBC with Differential/Platelet     Status: Abnormal   Collection Time: 11/28/14 11:15 AM  Result Value Ref Range   WBC 9.4 4.0 - 10.5 K/uL   RBC 4.03 3.87 - 5.11 MIL/uL   Hemoglobin 10.9 (L) 12.0 - 15.0 g/dL   HCT 32.7 (L) 36.0 - 46.0 %   MCV 81.1 78.0 - 100.0 fL   MCH 27.0 26.0 - 34.0 pg   MCHC 33.3 30.0 - 36.0 g/dL   RDW 14.8 11.5 - 15.5 %   Platelets 406 (H) 150 - 400 K/uL   Neutrophils Relative % 80 (H) 43 - 77 %   Neutro Abs 7.5 1.7 - 7.7 K/uL   Lymphocytes  Relative 12 12 - 46 %   Lymphs Abs 1.1 0.7 - 4.0 K/uL   Monocytes Relative 7 3 - 12 %   Monocytes Absolute 0.6 0.1 - 1.0 K/uL   Eosinophils Relative 1 0 - 5 %   Eosinophils Absolute 0.1 0.0 - 0.7 K/uL   Basophils Relative 0 0 - 1 %   Basophils Absolute 0.0 0.0 - 0.1 K/uL  Comprehensive metabolic panel     Status: Abnormal   Collection Time: 11/28/14 11:15 AM  Result Value Ref Range   Sodium 136 135 - 145 mmol/L   Potassium 3.6 3.5 - 5.1 mmol/L   Chloride 104 101 - 111 mmol/L   CO2 22 22 - 32 mmol/L   Glucose, Bld 84 65 - 99 mg/dL   BUN 77 (H) 6 - 20 mg/dL   Creatinine, Ser 0.98 0.44 - 1.00 mg/dL   Calcium 9.1 8.9 - 10.3 mg/dL   Total Protein 8.0 6.5 - 8.1 g/dL    Albumin 3.5 3.5 - 5.0 g/dL   AST 19 15 - 41 U/L   ALT 17 14 - 54 U/L   Alkaline Phosphatase 79 38 - 126 U/L   Total Bilirubin 0.5 0.3 - 1.2 mg/dL   GFR calc non Af Amer >60 >60 mL/min   GFR calc Af Amer >60 >60 mL/min    Comment: (NOTE) The eGFR has been calculated using the CKD EPI equation. This calculation has not been validated in all clinical situations. eGFR's persistently <60 mL/min signify possible Chronic Kidney Disease.    Anion gap 10 5 - 15  Urinalysis, Routine w reflex microscopic (not at [facility])     Status: Abnormal   Collection Time: 11/28/14 11:15 AM  Result Value Ref Range   Color, Urine YELLOW YELLOW   APPearance CLOUDY (A) CLEAR   Specific Gravity, Urine 1.015 1.005 - 1.030   pH 6.0 5.0 - 8.0   Glucose, UA NEGATIVE NEGATIVE mg/dL   Hgb urine dipstick LARGE (A) NEGATIVE   Bilirubin Urine NEGATIVE NEGATIVE   Ketones, ur NEGATIVE NEGATIVE mg/dL   Protein, ur 30 (A) NEGATIVE mg/dL   Urobilinogen, UA 0.2 0.0 - 1.0 mg/dL   Nitrite POSITIVE (A) NEGATIVE   Leukocytes, UA LARGE (A) NEGATIVE  TSH     Status: None   Collection Time: 11/28/14 11:15 AM  Result Value Ref Range   TSH 1.886 0.350 - 4.500 uIU/mL  Urine microscopic-add on     Status: Abnormal   Collection Time: 11/28/14 11:15 AM  Result Value Ref Range   Squamous Epithelial / LPF FEW (A) RARE   WBC, UA TOO NUMEROUS TO COUNT <3 WBC/hpf   RBC / HPF 21-50 <3 RBC/hpf   Bacteria, UA MANY (A) RARE  I-Stat CG4 Lactic Acid, ED     Status: None   Collection Time: 11/28/14  1:32 PM  Result Value Ref Range   Lactic Acid, Venous 0.61 0.5 - 2.0 mmol/L  Glucose, capillary     Status: Abnormal   Collection Time: 11/28/14  2:51 PM  Result Value Ref Range   Glucose-Capillary 49 (L) 65 - 99 mg/dL   Comment 1 Notify RN   Glucose, capillary     Status: Abnormal   Collection Time: 11/28/14  3:22 PM  Result Value Ref Range   Glucose-Capillary 187 (H) 65 - 99 mg/dL   Comment 1 Notify RN    Dg Chest 2 View  11/28/2014    CLINICAL DATA:  Altered mental status. Ex-smoker. Multiple sclerosis.  Hypertension. Diabetes.  EXAM: CHEST  2 VIEW  COMPARISON:  05/10/2011  FINDINGS: Lateral view degraded by patient arm position. artifact posteriorly on the lateral view as well. Midline trachea. Normal heart size and mediastinal contours. No pleural effusion or pneumothorax. Clear lungs.  IMPRESSION: No acute cardiopulmonary disease.   Electronically Signed   By: Abigail Miyamoto M.D.   On: 11/28/2014 12:12   Ct Head Wo Contrast  11/28/2014   CLINICAL DATA:  Slurred speech today. Hypoglycemia. History of type 2 diabetes. Initial encounter.  EXAM: CT HEAD WITHOUT CONTRAST  TECHNIQUE: Contiguous axial images were obtained from the base of the skull through the vertex without intravenous contrast.  COMPARISON:  None.  FINDINGS: There is no evidence of acute intracranial hemorrhage, mass lesion, brain edema or extra-axial fluid collection. The ventricles and subarachnoid spaces are prominent, especially within the posterior fossa. There is no CT evidence of acute cortical infarction. There is mild symmetric periventricular white matter disease, likely secondary to chronic small vessel ischemic change. Intracranial vascular calcifications are noted.  The visualized paranasal sinuses, mastoid air cells and middle ears are clear. The calvarium is intact.  IMPRESSION: No acute intracranial findings. Mild atrophy, primarily involving the cerebellar vermis. Mild chronic small vessel ischemic changes.   Electronically Signed   By: Richardean Sale M.D.   On: 11/28/2014 12:17    Review of Systems  Constitutional: Negative for fever and chills.  Eyes: Negative for double vision.  Respiratory: Negative for cough, hemoptysis and sputum production.   Cardiovascular: Negative for chest pain and palpitations.  Gastrointestinal: Negative for nausea, vomiting and abdominal pain.  Neurological: Positive for dizziness and weakness.    Blood pressure 125/59,  pulse 106, temperature 97.6 F (36.4 C), temperature source Oral, resp. rate 16, height 5' 3" (1.6 m), weight 80.9 kg (178 lb 5.6 oz), SpO2 100 %. Physical Exam  HENT:  Head: Normocephalic.  Eyes: Conjunctivae are normal. Left eye exhibits no discharge. No scleral icterus.  Neck: Neck supple. No tracheal deviation present. No thyromegaly present.  Cardiovascular: Normal rate and regular rhythm.   No murmur heard. Respiratory: Effort normal and breath sounds normal. No respiratory distress. She has no wheezes.  GI: Soft. Bowel sounds are normal. She exhibits no distension. There is no tenderness.  Musculoskeletal:  No clubbing cyanosis 4+ edema with chronic dermatosis     Assessment/Plan Status post altered mental status secondary to hypoglycemia Possible UTI Hypertension Diabetes mellitus Multiple sclerosis Morbid obesity Chronic lymphedema with dermatosis Plan As per orders  Charolette Forward 11/28/2014, 3:41 PM

## 2014-11-28 NOTE — ED Notes (Signed)
Spoke with Ron. Patient is going to 1428.

## 2014-11-28 NOTE — ED Notes (Signed)
Bed: JG94 Expected date:  Expected time:  Means of arrival:  Comments: EMS-low suga

## 2014-11-29 LAB — BASIC METABOLIC PANEL
Anion gap: 9 (ref 5–15)
BUN: 46 mg/dL — AB (ref 6–20)
CALCIUM: 9 mg/dL (ref 8.9–10.3)
CHLORIDE: 107 mmol/L (ref 101–111)
CO2: 22 mmol/L (ref 22–32)
CREATININE: 0.57 mg/dL (ref 0.44–1.00)
GFR calc non Af Amer: >60 mL/min (ref >60)
Glucose, Bld: 123 mg/dL — ABNORMAL HIGH (ref 65–99)
Potassium: 3.4 mmol/L — ABNORMAL LOW (ref 3.5–5.1)
SODIUM: 138 mmol/L (ref 135–145)

## 2014-11-29 LAB — CBC
HEMATOCRIT: 29.9 % — AB (ref 36.0–46.0)
Hemoglobin: 9.6 g/dL — ABNORMAL LOW (ref 12.0–15.0)
MCH: 26.5 pg (ref 26.0–34.0)
MCHC: 32.1 g/dL (ref 30.0–36.0)
MCV: 82.6 fL (ref 78.0–100.0)
Platelets: 378 10*3/uL (ref 150–400)
RBC: 3.62 MIL/uL — AB (ref 3.87–5.11)
RDW: 14.7 % (ref 11.5–15.5)
WBC: 6.9 10*3/uL (ref 4.0–10.5)

## 2014-11-29 LAB — GLUCOSE, CAPILLARY
GLUCOSE-CAPILLARY: 161 mg/dL — AB (ref 65–99)
Glucose-Capillary: 111 mg/dL — ABNORMAL HIGH (ref 65–99)
Glucose-Capillary: 121 mg/dL — ABNORMAL HIGH (ref 65–99)
Glucose-Capillary: 183 mg/dL — ABNORMAL HIGH (ref 65–99)

## 2014-11-29 NOTE — Progress Notes (Signed)
Subjective:  No further episodes of hypoglycemia or hypothermia. Urine cultures are pending states feeling better.  Objective:  Vital Signs in the last 24 hours: Temp:  [94.3 F (34.6 C)-98 F (36.7 C)] 98 F (36.7 C) (06/12 0457) Pulse Rate:  [103-106] 103 (06/12 0457) Resp:  [11-16] 16 (06/12 0457) BP: (93-125)/(56-87) 103/60 mmHg (06/12 0457) SpO2:  [100 %] 100 % (06/12 0457) Weight:  [80.9 kg (178 lb 5.6 oz)] 80.9 kg (178 lb 5.6 oz) (06/11 1451)  Intake/Output from previous day:   Intake/Output from this shift: Total I/O In: 360 [P.O.:360] Out: -   Physical Exam: Neck: no adenopathy, no carotid bruit, no JVD and supple, symmetrical, trachea midline Lungs: clear to auscultation bilaterally Heart: regular rate and rhythm, S1, S2 normal, no murmur, click, rub or gallop Abdomen: soft, non-tender; bowel sounds normal; no masses,  no organomegaly Extremities: No clubbing cyanosis 4+ chronic edema with dermatosis noted  Lab Results:  Recent Labs  11/28/14 1115 11/29/14 0411  WBC 9.4 6.9  HGB 10.9* 9.6*  PLT 406* 378    Recent Labs  11/28/14 1115 11/29/14 0411  NA 136 138  K 3.6 3.4*  CL 104 107  CO2 22 22  GLUCOSE 84 123*  BUN 77* 46*  CREATININE 0.98 0.57   No results for input(s): TROPONINI in the last 72 hours.  Invalid input(s): CK, MB Hepatic Function Panel  Recent Labs  11/28/14 1115  PROT 8.0  ALBUMIN 3.5  AST 19  ALT 17  ALKPHOS 79  BILITOT 0.5   No results for input(s): CHOL in the last 72 hours. No results for input(s): PROTIME in the last 72 hours.  Imaging: Imaging results have been reviewed and Dg Chest 2 View  11/28/2014   CLINICAL DATA:  Altered mental status. Ex-smoker. Multiple sclerosis. Hypertension. Diabetes.  EXAM: CHEST  2 VIEW  COMPARISON:  05/10/2011  FINDINGS: Lateral view degraded by patient arm position. artifact posteriorly on the lateral view as well. Midline trachea. Normal heart size and mediastinal contours. No  pleural effusion or pneumothorax. Clear lungs.  IMPRESSION: No acute cardiopulmonary disease.   Electronically Signed   By: Abigail Miyamoto M.D.   On: 11/28/2014 12:12   Ct Head Wo Contrast  11/28/2014   CLINICAL DATA:  Slurred speech today. Hypoglycemia. History of type 2 diabetes. Initial encounter.  EXAM: CT HEAD WITHOUT CONTRAST  TECHNIQUE: Contiguous axial images were obtained from the base of the skull through the vertex without intravenous contrast.  COMPARISON:  None.  FINDINGS: There is no evidence of acute intracranial hemorrhage, mass lesion, brain edema or extra-axial fluid collection. The ventricles and subarachnoid spaces are prominent, especially within the posterior fossa. There is no CT evidence of acute cortical infarction. There is mild symmetric periventricular white matter disease, likely secondary to chronic small vessel ischemic change. Intracranial vascular calcifications are noted.  The visualized paranasal sinuses, mastoid air cells and middle ears are clear. The calvarium is intact.  IMPRESSION: No acute intracranial findings. Mild atrophy, primarily involving the cerebellar vermis. Mild chronic small vessel ischemic changes.   Electronically Signed   By: Richardean Sale M.D.   On: 11/28/2014 12:17    Cardiac Studies:  Assessment/Plan:  Status post altered mental status secondary to hypoglycemia Possible UTI Hypertension Diabetes mellitus Multiple sclerosis Morbid obesity Chronic lymphedema with dermatosis Plan Continue present management Check cultures Home soon Dr. Doylene Canard to follow from a.m.  LOS: 1 day    Jocelyn Sanchez 11/29/2014, 12:22 PM

## 2014-11-29 NOTE — Consult Note (Signed)
WOC wound consult note Reason for Consult: Moisture associated skin damage (MASD), specifically intertriginous dermatitis (ITD) in the inframammary areas (bilateral). Healed full thickness pressure injury in the sacral area (scar tissue).  Patient states this is tender to touch. Reports that it was a Stage 3 pressure injury. Wound type:Pressure, moisture Pressure Ulcer POA: Yes Measurement:healwed area measures 5cm x 4cm with unpigmented scar tissue evident.  Macerated tissue in the bilateral inframammary areas is noted. Wound FYB:OFBPZ with serous exudate (inframammary) Drainage (amount, consistency, odor) scant serous, no odor Periwound:intact Dressing procedure/placement/frequency: I will provide a mattress replacement for moisture management and our house antimicrobial textile for the intertriginous dermatitis.  A soft silicone prophylactic dressing will be placed over the sacrum as an preventive intervention.  Instructions communicated to RN, orders provided.  Patient is in agreement with POC. [place] nursing team will not follow, but will remain available to this patient, the nursing and medical teams.  Please re-consult if needed. Thanks, Maudie Flakes, MSN, RN, [place], [place], [place] [phone])

## 2014-11-30 LAB — URINE CULTURE: Colony Count: >=100000

## 2014-11-30 LAB — GLUCOSE, CAPILLARY
GLUCOSE-CAPILLARY: 195 mg/dL — AB (ref 65–99)
Glucose-Capillary: 171 mg/dL — ABNORMAL HIGH (ref 65–99)
Glucose-Capillary: 195 mg/dL — ABNORMAL HIGH (ref 65–99)
Glucose-Capillary: 144 mg/dL — ABNORMAL HIGH (ref 65–99)

## 2014-11-30 LAB — HEMOGLOBIN A1C
Hgb A1c MFr Bld: 6.7 % — ABNORMAL HIGH (ref 4.8–5.6)
Mean Plasma Glucose: 146 mg/dL

## 2014-11-30 MED ORDER — METOPROLOL TARTRATE 25 MG PO TABS
25.0000 mg | ORAL_TABLET | Freq: Two times a day (BID) | ORAL | Status: DC
  Administered 2014-11-30 – 2014-12-04 (×8): 25 mg via ORAL
  Filled 2014-11-30 (×8): qty 1

## 2014-11-30 MED ORDER — HYDROCODONE-ACETAMINOPHEN 5-325 MG PO TABS
1.0000 | ORAL_TABLET | Freq: Every day | ORAL | Status: DC
  Administered 2014-12-01 – 2014-12-03 (×2): 1 via ORAL
  Filled 2014-11-30 (×4): qty 1

## 2014-11-30 MED ORDER — HYDROCODONE-ACETAMINOPHEN 5-325 MG PO TABS
1.0000 | ORAL_TABLET | Freq: Once | ORAL | Status: AC
  Administered 2014-11-30: 1 via ORAL
  Filled 2014-11-30: qty 1

## 2014-11-30 NOTE — Progress Notes (Signed)
Ref: Jocelyn Riddle, MD   Subjective:  Feeling better. Not able to move both lower extremities. T max 99.4 degree F. Has 6 hour aid at home. Refuses NH placement.  Objective:  Vital Signs in the last 24 hours: Temp:  [98.3 F (36.8 C)-99.4 F (37.4 C)] 99.4 F (37.4 C) (06/13 1526) Pulse Rate:  [112-125] 112 (06/13 1526) Cardiac Rhythm:  [-] Sinus tachycardia (06/12 2046) Resp:  [16-18] 18 (06/13 1526) BP: (111-130)/(50-67) 130/67 mmHg (06/13 1526) SpO2:  [99 %-100 %] 100 % (06/13 1526)  Physical Exam: BP Readings from Last 1 Encounters:  11/30/14 130/67    Wt Readings from Last 1 Encounters:  11/28/14 80.9 kg (178 lb 5.6 oz)    Weight change:   HEENT: [place]/AT, Eyes-Brown, PERL, EOMI, Conjunctiva-Pale pink, Sclera-Non-icteric Neck: No JVD, No bruit, Trachea midline. Lungs:  Clear, Bilateral. Cardiac:  Regular but rapid rhythm, normal S1 and S2, no S3.  Abdomen:  Soft, non-tender. Extremities:  1 + edema present. No cyanosis. No clubbing. Chronic edema with dermatosis, CNS: AxOx3, Cranial nerves grossly intact, moves upper extremities. Right handed. Skin: Warm and dry.   Intake/Output from previous day: 06/12 0701 - 06/13 0700 In: 650 [P.O.:600; IV Piggyback:50] Out: -     Lab Results: BMET    Component Value Date/Time   NA 138 11/29/2014 0411   NA 136 11/28/2014 1115   NA 137 03/08/2012 0440   K 3.4* 11/29/2014 0411   K 3.6 11/28/2014 1115   K 4.2 03/08/2012 0440   CL 107 11/29/2014 0411   CL 104 11/28/2014 1115   CL 103 03/08/2012 0440   CO2 22 11/29/2014 0411   CO2 22 11/28/2014 1115   CO2 22 03/08/2012 0440   GLUCOSE 123* 11/29/2014 0411   GLUCOSE 84 11/28/2014 1115   GLUCOSE 123* 03/08/2012 0440   BUN 46* 11/29/2014 0411   BUN 77* 11/28/2014 1115   BUN 15 03/08/2012 0440   CREATININE 0.57 11/29/2014 0411   CREATININE 0.98 11/28/2014 1115   CREATININE 1.30* 03/08/2012 0440   CALCIUM 9.0 11/29/2014 0411   CALCIUM 9.1 11/28/2014 1115   CALCIUM 8.6  03/08/2012 0440   GFRNONAA >60 11/29/2014 0411   GFRNONAA >60 11/28/2014 1115   GFRNONAA 45* 03/08/2012 0440   GFRAA >60 11/29/2014 0411   GFRAA >60 11/28/2014 1115   GFRAA 52* 03/08/2012 0440   CBC    Component Value Date/Time   WBC 6.9 11/29/2014 0411   RBC 3.62* 11/29/2014 0411   HGB 9.6* 11/29/2014 0411   HCT 29.9* 11/29/2014 0411   PLT 378 11/29/2014 0411   MCV 82.6 11/29/2014 0411   MCH 26.5 11/29/2014 0411   MCHC 32.1 11/29/2014 0411   RDW 14.7 11/29/2014 0411   LYMPHSABS 1.1 11/28/2014 1115   MONOABS 0.6 11/28/2014 1115   EOSABS 0.1 11/28/2014 1115   BASOSABS 0.0 11/28/2014 1115   HEPATIC Function Panel  Recent Labs  11/28/14 1115  PROT 8.0   HEMOGLOBIN A1C No components found for: HGA1C,  MPG CARDIAC ENZYMES No results found for: CKTOTAL, CKMB, CKMBINDEX, TROPONINI BNP No results for input(s): PROBNP in the last 8760 hours. TSH  Recent Labs  11/28/14 1115  TSH 1.886   CHOLESTEROL No results for input(s): CHOL in the last 8760 hours.  Scheduled Meds: . aspirin EC  81 mg Oral Daily  . baclofen  5 mg Oral TID  . cefTRIAXone (ROCEPHIN)  IV  1 g Intravenous Q24H  . heparin  5,000 Units Subcutaneous 3 times  per day  . HYDROcodone-acetaminophen  1 tablet Oral Q breakfast  . insulin aspart  0-9 Units Subcutaneous TID WC  . metoprolol tartrate  25 mg Oral BID   Continuous Infusions: . sodium chloride 10 mL/hr at 11/28/14 1654   PRN Meds:.methocarbamol  Assessment/Plan: Status post altered mental status secondary to hypoglycemia Possible UTI Hypertension Diabetes mellitus Multiple sclerosis Morbid obesity Chronic lymphedema with dermatosis Paraplegia  Add small dose B-blocker. Increase activity as tolerated.     LOS: 2 days    Dixie Dials  MD  11/30/2014, 3:56 PM

## 2014-11-30 NOTE — Progress Notes (Signed)
Initial Nutrition Assessment  DOCUMENTATION CODES:  Obesity unspecified  INTERVENTION: - Continue Heart Healthy diet - RD will continue to monitor for needs  NUTRITION DIAGNOSIS:  Altered nutrition lab value related to acute illness as evidenced by other (see comment) (BG: 49-187 mg/dL).  GOAL:  Patient will meet greater than or equal to 90% of their needs  MONITOR:  PO intake, Weight trends, Labs, I & O's  REASON FOR ASSESSMENT:  Low Braden  ASSESSMENT: Per H&P, 59 year old female with past medical history significant for hypertension, diabetes mellitus, multiple sclerosis, chronic lymphedema with dermatosis nonambulatory, came to ER by EMS as patient was noted to be drowsy slurred speech and was noted to be hypoglycemic with blood sugar of 38 patient received oral glucagon and milk repeat blood sugar was 76 patient was also noted to be hypo-thermic with temperature of 93.73F and was mildly tachycardic patient was noted to also have sepsis received IV Rocephin in the ED. Patient was placed under Bair hugger for 2 hours with minimal improvement in her temperature.   Pt seen for low Braden. She reports she had grits, toast with sugar-free jelly, and orange juice for breakfast this AM. Pt reports appetite often fluctuates at home. Per chart review, pt ate 25-50% of meals yesterday. She denies abdominal pain or nausea now or PTA. She indicates that her weight has been stable recently. Per weight hx review, pt has gained 15 lbs in the past 1 month. She does not drink nutrition supplements at home.  Physical assessment shows moderate edema to bilateral legs but no muscle or fat wasting. Pt likely to meet needs with meals alone. Medications and labs reviewed; CBGs: 49-187 mg/dL, K: 3.4 mmol/L, BUN elevated.  Height:  Ht Readings from Last 1 Encounters:  11/28/14 5\' 3"  (1.6 m)    Weight:  Wt Readings from Last 1 Encounters:  11/28/14 178 lb 5.6 oz (80.9 kg)    Ideal Body Weight:   52.3 kg (kg)  Wt Readings from Last 10 Encounters:  11/28/14 178 lb 5.6 oz (80.9 kg)  10/24/14 163 lb (73.936 kg)  03/03/12 165 lb (74.844 kg)  12/17/11 179 lb 0.2 oz (81.2 kg)  05/13/11 179 lb 3.7 oz (81.3 kg)    BMI:  Body mass index is 31.6 kg/(m^2).  Estimated Nutritional Needs:  Kcal:  1500-1700  Protein:  70-80 grams  Fluid:  2 L/day  Skin:     Diet Order:  Diet heart healthy/carb modified Room service appropriate?: Yes; Fluid consistency:: Thin  EDUCATION NEEDS:  No education needs identified at this time   Intake/Output Summary (Last 24 hours) at 11/30/14 0931 Last data filed at 11/30/14 0845  Gross per 24 hour  Intake    890 ml  Output      0 ml  Net    890 ml    Last BM:  6/11   Jarome Matin, RD, LDN Inpatient Clinical Dietitian Pager # [phone] After hours/weekend pager # [phone]

## 2014-12-01 LAB — GLUCOSE, CAPILLARY
GLUCOSE-CAPILLARY: 155 mg/dL — AB (ref 65–99)
GLUCOSE-CAPILLARY: 146 mg/dL — AB (ref 65–99)
Glucose-Capillary: 137 mg/dL — ABNORMAL HIGH (ref 65–99)
Glucose-Capillary: 159 mg/dL — ABNORMAL HIGH (ref 65–99)

## 2014-12-01 MED ORDER — POTASSIUM CHLORIDE ER 10 MEQ PO TBCR
10.0000 meq | EXTENDED_RELEASE_TABLET | Freq: Every day | ORAL | Status: DC
  Administered 2014-12-01 – 2014-12-04 (×3): 10 meq via ORAL
  Filled 2014-12-01 (×8): qty 1

## 2014-12-01 NOTE — Progress Notes (Signed)
Ref: Eutha Cude S, MD   Subjective:  Bilateral hand swelling left hand and forearm more than right from IV infiltration. Not able to feed self. T max 99.5 degree F.  Objective:  Vital Signs in the last 24 hours: Temp:  [98.6 F (37 C)-99.5 F (37.5 C)] 98.7 F (37.1 C) (06/14 1305) Pulse Rate:  [89-112] 89 (06/14 1305) Cardiac Rhythm:  [-] Normal sinus rhythm (06/14 0708) Resp:  [18-20] 20 (06/14 1305) BP: (111-130)/(58-69) 111/59 mmHg (06/14 1305) SpO2:  [100 %] 100 % (06/14 1305)  Physical Exam: BP Readings from Last 1 Encounters:  12/01/14 111/59    Wt Readings from Last 1 Encounters:  11/28/14 80.9 kg (178 lb 5.6 oz)    Weight change:   HEENT: [place]/AT, Eyes-Brown, PERL, EOMI, Conjunctiva-Pale pink, Sclera-Non-icteric Neck: No JVD, No bruit, Trachea midline. Lungs:  Clear, Bilateral. Cardiac:  Regular rhythm, normal S1 and S2, no S3.  Abdomen:  Soft, non-tender. Extremities:  1 + edema with dermatosis both lower legs present. No cyanosis. No clubbing. CNS: AxOx3, Cranial nerves grossly intact, barely moves upper extremities. Right handed. Skin: Warm and dry.   Intake/Output from previous day: 06/13 0701 - 06/14 0700 In: 650 [P.O.:600; IV Piggyback:50] Out: -     Lab Results: BMET    Component Value Date/Time   NA 138 11/29/2014 0411   NA 136 11/28/2014 1115   NA 137 03/08/2012 0440   K 3.4* 11/29/2014 0411   K 3.6 11/28/2014 1115   K 4.2 03/08/2012 0440   CL 107 11/29/2014 0411   CL 104 11/28/2014 1115   CL 103 03/08/2012 0440   CO2 22 11/29/2014 0411   CO2 22 11/28/2014 1115   CO2 22 03/08/2012 0440   GLUCOSE 123* 11/29/2014 0411   GLUCOSE 84 11/28/2014 1115   GLUCOSE 123* 03/08/2012 0440   BUN 46* 11/29/2014 0411   BUN 77* 11/28/2014 1115   BUN 15 03/08/2012 0440   CREATININE 0.57 11/29/2014 0411   CREATININE 0.98 11/28/2014 1115   CREATININE 1.30* 03/08/2012 0440   CALCIUM 9.0 11/29/2014 0411   CALCIUM 9.1 11/28/2014 1115   CALCIUM 8.6  03/08/2012 0440   GFRNONAA >60 11/29/2014 0411   GFRNONAA >60 11/28/2014 1115   GFRNONAA 45* 03/08/2012 0440   GFRAA >60 11/29/2014 0411   GFRAA >60 11/28/2014 1115   GFRAA 52* 03/08/2012 0440   CBC    Component Value Date/Time   WBC 6.9 11/29/2014 0411   RBC 3.62* 11/29/2014 0411   HGB 9.6* 11/29/2014 0411   HCT 29.9* 11/29/2014 0411   PLT 378 11/29/2014 0411   MCV 82.6 11/29/2014 0411   MCH 26.5 11/29/2014 0411   MCHC 32.1 11/29/2014 0411   RDW 14.7 11/29/2014 0411   LYMPHSABS 1.1 11/28/2014 1115   MONOABS 0.6 11/28/2014 1115   EOSABS 0.1 11/28/2014 1115   BASOSABS 0.0 11/28/2014 1115   HEPATIC Function Panel  Recent Labs  11/28/14 1115  PROT 8.0   HEMOGLOBIN A1C No components found for: HGA1C,  MPG CARDIAC ENZYMES No results found for: CKTOTAL, CKMB, CKMBINDEX, TROPONINI BNP No results for input(s): PROBNP in the last 8760 hours. TSH  Recent Labs  11/28/14 1115  TSH 1.886   CHOLESTEROL No results for input(s): CHOL in the last 8760 hours.  Scheduled Meds: . aspirin EC  81 mg Oral Daily  . baclofen  5 mg Oral TID  . cefTRIAXone (ROCEPHIN)  IV  1 g Intravenous Q24H  . heparin  5,000 Units Subcutaneous 3 times  per day  . HYDROcodone-acetaminophen  1 tablet Oral Q breakfast  . insulin aspart  0-9 Units Subcutaneous TID WC  . metoprolol tartrate  25 mg Oral BID  . potassium chloride  10 mEq Oral Daily   Continuous Infusions: . sodium chloride 10 mL/hr at 11/28/14 1654   PRN Meds:.methocarbamol  Assessment/Plan: Status post altered mental status secondary to hypoglycemia Possible UTI Hypertension Diabetes mellitus Multiple sclerosis Morbid obesity Chronic lymphedema with dermatosis Paraplegia  Elevate right and left arm for swelling.  Meet with care taker tomorrow if possible. Patient to call her to hospital or send her to my office.     LOS: 3 days    Dixie Dials  MD  12/01/2014, 7:19 PM

## 2014-12-02 LAB — GLUCOSE, CAPILLARY
GLUCOSE-CAPILLARY: 197 mg/dL — AB (ref 65–99)
Glucose-Capillary: 139 mg/dL — ABNORMAL HIGH (ref 65–99)
Glucose-Capillary: 168 mg/dL — ABNORMAL HIGH (ref 65–99)
Glucose-Capillary: 157 mg/dL — ABNORMAL HIGH (ref 65–99)

## 2014-12-02 MED ORDER — CIPROFLOXACIN HCL 500 MG PO TABS
500.0000 mg | ORAL_TABLET | Freq: Two times a day (BID) | ORAL | Status: DC
  Administered 2014-12-02 – 2014-12-04 (×4): 500 mg via ORAL
  Filled 2014-12-02 (×4): qty 1

## 2014-12-02 MED ORDER — IBUPROFEN 200 MG PO TABS
400.0000 mg | ORAL_TABLET | Freq: Three times a day (TID) | ORAL | Status: DC
  Administered 2014-12-02 – 2014-12-04 (×5): 400 mg via ORAL
  Filled 2014-12-02 (×5): qty 2

## 2014-12-02 NOTE — Progress Notes (Signed)
Patient refused blood stick per phlebotomist. I asked them to come back later this morning and try again.

## 2014-12-02 NOTE — Progress Notes (Signed)
Ref: [name], MD   Subjective:  Lost IV. No IV rocephin given. Bilateral forearm and hand swelling left more than right.  Objective:  Vital Signs in the last 24 hours: Temp:  [98.7 F (37.1 C)-99.5 F (37.5 C)] 99.5 F (37.5 C) (06/15 1406) Pulse Rate:  [93-109] 107 (06/15 1406) Cardiac Rhythm:  [-] Sinus tachycardia (06/14 2015) Resp:  [18-20] 20 (06/15 1406) BP: (123-126)/(59-69) 126/59 mmHg (06/15 1406) SpO2:  [98 %-100 %] 100 % (06/15 1406)  Physical Exam: BP Readings from Last 1 Encounters:  12/02/14 126/59    Wt Readings from Last 1 Encounters:  11/28/14 80.9 kg (178 lb 5.6 oz)    Weight change:   HEENT: [place]/AT, Eyes-Brown, PERL, EOMI, Conjunctiva-Pale pink, Sclera-Non-icteric Neck: No JVD, No bruit, Trachea midline. Lungs:  Clear, Bilateral. Cardiac:  Regular rhythm, normal S1 and S2, no S3.  Abdomen:  Soft, non-tender. Extremities:  1 + edema of forearm, hands and bilateral lower legs with dermatosis present. No cyanosis. No clubbing. CNS: AxOx3, Cranial nerves grossly intact, barely moves upper extremities. Right handed. Skin: Warm and dry.   Intake/Output from previous day: 06/14 0701 - 06/15 0700 In: 780 [P.O.:720; I.V.:10; IV Piggyback:50] Out: -     Lab Results: BMET    Component Value Date/Time   NA 138 11/29/2014 0411   NA 136 11/28/2014 1115   NA 137 03/08/2012 0440   K 3.4* 11/29/2014 0411   K 3.6 11/28/2014 1115   K 4.2 03/08/2012 0440   CL 107 11/29/2014 0411   CL 104 11/28/2014 1115   CL 103 03/08/2012 0440   CO2 22 11/29/2014 0411   CO2 22 11/28/2014 1115   CO2 22 03/08/2012 0440   GLUCOSE 123* 11/29/2014 0411   GLUCOSE 84 11/28/2014 1115   GLUCOSE 123* 03/08/2012 0440   BUN 46* 11/29/2014 0411   BUN 77* 11/28/2014 1115   BUN 15 03/08/2012 0440   CREATININE 0.57 11/29/2014 0411   CREATININE 0.98 11/28/2014 1115   CREATININE 1.30* 03/08/2012 0440   CALCIUM 9.0 11/29/2014 0411   CALCIUM 9.1 11/28/2014 1115   CALCIUM 8.6  03/08/2012 0440   GFRNONAA >60 11/29/2014 0411   GFRNONAA >60 11/28/2014 1115   GFRNONAA 45* 03/08/2012 0440   GFRAA >60 11/29/2014 0411   GFRAA >60 11/28/2014 1115   GFRAA 52* 03/08/2012 0440   CBC    Component Value Date/Time   WBC 6.9 11/29/2014 0411   RBC 3.62* 11/29/2014 0411   HGB 9.6* 11/29/2014 0411   HCT 29.9* 11/29/2014 0411   PLT 378 11/29/2014 0411   MCV 82.6 11/29/2014 0411   MCH 26.5 11/29/2014 0411   MCHC 32.1 11/29/2014 0411   RDW 14.7 11/29/2014 0411   LYMPHSABS 1.1 11/28/2014 1115   MONOABS 0.6 11/28/2014 1115   EOSABS 0.1 11/28/2014 1115   BASOSABS 0.0 11/28/2014 1115   HEPATIC Function Panel  Recent Labs  11/28/14 1115  PROT 8.0   HEMOGLOBIN A1C No components found for: HGA1C,  MPG CARDIAC ENZYMES No results found for: CKTOTAL, CKMB, CKMBINDEX, TROPONINI BNP No results for input(s): PROBNP in the last 8760 hours. TSH  Recent Labs  11/28/14 1115  TSH 1.886   CHOLESTEROL No results for input(s): CHOL in the last 8760 hours.  Scheduled Meds: . aspirin EC  81 mg Oral Daily  . baclofen  5 mg Oral TID  . cefTRIAXone (ROCEPHIN)  IV  1 g Intravenous Q24H  . ciprofloxacin  500 mg Oral BID  . heparin  5,000 Units Subcutaneous 3 times per day  . HYDROcodone-acetaminophen  1 tablet Oral Q breakfast  . ibuprofen  400 mg Oral TID  . insulin aspart  0-9 Units Subcutaneous TID WC  . metoprolol tartrate  25 mg Oral BID  . potassium chloride  10 mEq Oral Daily   Continuous Infusions: . sodium chloride 10 mL/hr at 11/28/14 1654   PRN Meds:.methocarbamol  Assessment/Plan: Status post altered mental status secondary to hypoglycemia Possible UTI Hypertension Diabetes mellitus Multiple sclerosis Morbid obesity Chronic lymphedema with dermatosis Paraplegia  Add Ibuprofen and oral antibiotic.  Home soon.     LOS: 4 days    Jocelyn Dials  MD  12/02/2014, 7:55 PM

## 2014-12-02 NOTE — Care Management Note (Signed)
Case Management Note  Patient Details  Name: BRYNDA HEICK MRN: [id number] Date of Birth: 1956/03/09  Subjective/Objective:   Pt admitted with UTI   Action/Plan:from home plan to return home   Expected Discharge Date:  11/30/14               Expected Discharge Plan:  Home/Self Care  In-House Referral:     Discharge planning Services  CM Consult  Post Acute Care Choice:    Choice offered to:     DME Arranged:    DME Agency:     HH Arranged:    [place] Agency:     Status of Service:     Medicare Important Message Given:    Date Medicare IM Given:    Medicare IM give by:    Date Additional Medicare IM Given:    Additional Medicare Important Message give by:     If discussed at [place] of Stay Meetings, dates discussed:    Additional CommentsPurcell Mouton, RN 12/02/2014, 11:24 AM

## 2014-12-02 NOTE — Progress Notes (Signed)
Patient refused to have another peripheral IV reinserted.

## 2014-12-03 LAB — GLUCOSE, CAPILLARY
GLUCOSE-CAPILLARY: 135 mg/dL — AB (ref 65–99)
Glucose-Capillary: 144 mg/dL — ABNORMAL HIGH (ref 65–99)
Glucose-Capillary: 187 mg/dL — ABNORMAL HIGH (ref 65–99)
Glucose-Capillary: 128 mg/dL — ABNORMAL HIGH (ref 65–99)

## 2014-12-03 NOTE — Progress Notes (Signed)
Ref: [name], MD   Subjective:  Feeling better. Improving left forearm swelling. Tried feeding self with left hand.  Objective:  Vital Signs in the last 24 hours: Temp:  [97.7 F (36.5 C)-99.1 F (37.3 C)] 99.1 F (37.3 C) (06/16 2140) Pulse Rate:  [89-96] 96 (06/16 2140) Cardiac Rhythm:  [-] Normal sinus rhythm (06/16 2029) Resp:  [18-20] 20 (06/16 2140) BP: (107-122)/(56-74) 107/63 mmHg (06/16 2140) SpO2:  [100 %] 100 % (06/16 2140)  Physical Exam: BP Readings from Last 1 Encounters:  12/03/14 107/63    Wt Readings from Last 1 Encounters:  11/28/14 80.9 kg (178 lb 5.6 oz)    Weight change:   HEENT: [place]/AT, Eyes-Brown, PERL, EOMI, Conjunctiva-Pale pink, Sclera-Non-icteric Neck: No JVD, No bruit, Trachea midline. Lungs:  Clear, Bilateral. Cardiac:  Regular rhythm, normal S1 and S2, no S3.  Abdomen:  Soft, non-tender. Extremities:  Decreasing edema of both hands and forearms. No cyanosis. No clubbing.  CNS: AxOx3, Cranial nerves grossly intact, moves all 4 extremities. Right handed. Skin: Warm and dry.   Intake/Output from previous day: 06/15 0701 - 06/16 0700 In: 960 [P.O.:960] Out: -     Lab Results: BMET    Component Value Date/Time   NA 138 11/29/2014 0411   NA 136 11/28/2014 1115   NA 137 03/08/2012 0440   K 3.4* 11/29/2014 0411   K 3.6 11/28/2014 1115   K 4.2 03/08/2012 0440   CL 107 11/29/2014 0411   CL 104 11/28/2014 1115   CL 103 03/08/2012 0440   CO2 22 11/29/2014 0411   CO2 22 11/28/2014 1115   CO2 22 03/08/2012 0440   GLUCOSE 123* 11/29/2014 0411   GLUCOSE 84 11/28/2014 1115   GLUCOSE 123* 03/08/2012 0440   BUN 46* 11/29/2014 0411   BUN 77* 11/28/2014 1115   BUN 15 03/08/2012 0440   CREATININE 0.57 11/29/2014 0411   CREATININE 0.98 11/28/2014 1115   CREATININE 1.30* 03/08/2012 0440   CALCIUM 9.0 11/29/2014 0411   CALCIUM 9.1 11/28/2014 1115   CALCIUM 8.6 03/08/2012 0440   GFRNONAA >60 11/29/2014 0411   GFRNONAA >60 11/28/2014 1115    GFRNONAA 45* 03/08/2012 0440   GFRAA >60 11/29/2014 0411   GFRAA >60 11/28/2014 1115   GFRAA 52* 03/08/2012 0440   CBC    Component Value Date/Time   WBC 6.9 11/29/2014 0411   RBC 3.62* 11/29/2014 0411   HGB 9.6* 11/29/2014 0411   HCT 29.9* 11/29/2014 0411   PLT 378 11/29/2014 0411   MCV 82.6 11/29/2014 0411   MCH 26.5 11/29/2014 0411   MCHC 32.1 11/29/2014 0411   RDW 14.7 11/29/2014 0411   LYMPHSABS 1.1 11/28/2014 1115   MONOABS 0.6 11/28/2014 1115   EOSABS 0.1 11/28/2014 1115   BASOSABS 0.0 11/28/2014 1115   HEPATIC Function Panel  Recent Labs  11/28/14 1115  PROT 8.0   HEMOGLOBIN A1C No components found for: HGA1C,  MPG CARDIAC ENZYMES No results found for: CKTOTAL, CKMB, CKMBINDEX, TROPONINI BNP No results for input(s): PROBNP in the last 8760 hours. TSH  Recent Labs  11/28/14 1115  TSH 1.886   CHOLESTEROL No results for input(s): CHOL in the last 8760 hours.  Scheduled Meds: . aspirin EC  81 mg Oral Daily  . baclofen  5 mg Oral TID  . cefTRIAXone (ROCEPHIN)  IV  1 g Intravenous Q24H  . ciprofloxacin  500 mg Oral BID  . heparin  5,000 Units Subcutaneous 3 times per day  . HYDROcodone-acetaminophen  1 tablet Oral Q breakfast  . ibuprofen  400 mg Oral TID  . insulin aspart  0-9 Units Subcutaneous TID WC  . metoprolol tartrate  25 mg Oral BID  . potassium chloride  10 mEq Oral Daily   Continuous Infusions: . sodium chloride 10 mL/hr at 11/28/14 1654   PRN Meds:.methocarbamol  Assessment/Plan: Status post altered mental status secondary to hypoglycemia Possible UTI Hypertension Diabetes mellitus Multiple sclerosis Morbid obesity Chronic lymphedema with dermatosis Paraplegia  Continue medical treatment. Home in AM as patient refuses NH placement.    LOS: 5 days    Dixie Dials  MD  12/03/2014, 11:45 PM

## 2014-12-04 LAB — CULTURE, BLOOD (ROUTINE X 2)
Culture: NO GROWTH
Culture: NO GROWTH
Special Requests: NORMAL
Special Requests: NORMAL

## 2014-12-04 LAB — GLUCOSE, CAPILLARY: Glucose-Capillary: 110 mg/dL — ABNORMAL HIGH (ref 65–99)

## 2014-12-04 MED ORDER — IBUPROFEN 400 MG PO TABS: 400.0000 mg | ORAL_TABLET | Freq: Two times a day (BID) | ORAL | Status: DC

## 2014-12-04 MED ORDER — POTASSIUM CHLORIDE ER 10 MEQ PO TBCR: 10.0000 meq | EXTENDED_RELEASE_TABLET | Freq: Every day | ORAL | Status: DC

## 2014-12-04 MED ORDER — FERROUS SULFATE 325 (65 FE) MG PO TABS: 325.0000 mg | ORAL_TABLET | Freq: Every day | ORAL | Status: DC

## 2014-12-04 MED ORDER — GLIPIZIDE 5 MG PO TABS: 2.5000 mg | ORAL_TABLET | Freq: Every day | ORAL | Status: DC

## 2014-12-04 MED ORDER — ASPIRIN 81 MG PO TBEC
81.0000 mg | DELAYED_RELEASE_TABLET | Freq: Every day | ORAL | Status: DC
Start: 2014-12-04 — End: 2015-10-31

## 2014-12-04 MED ORDER — CIPROFLOXACIN HCL 500 MG PO TABS: 500.0000 mg | ORAL_TABLET | Freq: Two times a day (BID) | ORAL | Status: DC

## 2014-12-04 NOTE — Discharge Summary (Signed)
Physician Discharge Summary  Patient ID: Jocelyn Sanchez MRN: [id number] DOB/AGE: 1955/07/30 59 y.o.  Admit date: 11/28/2014 Discharge date: 12/04/2014  Admission Diagnoses: Status post altered mental status secondary to hypoglycemia Possible UTI Hypertension Diabetes mellitus Multiple sclerosis Morbid obesity Chronic lymphedema with dermatosis Paraplegia  Discharge Diagnoses:  Principle Problem: * Altered mental status *   UTI (lower urinary tract infection)   Hypoglycemia   Hypertension   Diabetes mellitus, II   Multiple sclerosis   Morbid obesity   Chronic lymphedema with dermatosis   Chronic iron deficiency anemia   Paraplegia   Hypokalemia-improved   Iatrogenic bilateral hand and forearm swelling  Discharged Condition: fair  Hospital Course: 59 year old female with past medical history significant for hypertension, diabetes mellitus, multiple sclerosis, chronic lymphedema with dermatosis nonambulatory, came to ER by EMS as patient was noted to be drowsy slurred speech and was noted to be hypoglycemic with blood sugar of 38 patient received oral glucagon and milk repeat blood sugar was 76. Patient was also noted to be hypo-thermic with temperature of 93.27F and was mildly tachycardic patient was noted to also have sepsis received IV Rocephin in the ED. She responded to [employer], IV fluids and IV antibiotics. Her chronic bilateral legf edema improved with lying in bed as opposed to hanging down in electric chair use of 12 to 18 hours a day.  She had bilateral hand and forearm swelling from IV infiltration that improved with ibuprofen use and hand elevation x 2 days. She is paraplegic and nursing home care was stongly recommended but patient and family chose home care with personal care [place]. Her Glipizide dose was reduced by 75 %. She will resume iron pill, one or two daily as tolerated and be seen by me in 1 week.  Consults: None  Significant Diagnostic Studies: labs:  Normal CMET except high BUN. CBC near normal except Hgb of 9.6 to 10.9  Treatments: antibiotics: vancomycin and ceftriaxone followed by PO ciprofloxacin. Motrin for had and forearm swelling.  Discharge Exam: Blood pressure 108/53, pulse 92, temperature 98.8 F (37.1 C), temperature source Oral, resp. rate 20, height 5\' 3"  (1.6 m), weight 80.9 kg (178 lb 5.6 oz), SpO2 100 %. HEENT: [place]/AT, Eyes-Brown, PERL, EOMI, Conjunctiva-Pale pink, Sclera-Non-icteric Neck: No JVD, No bruit, Trachea midline. Lungs: Clear, Bilateral. Cardiac: Regular rhythm, normal S1 and S2, no S3.  Abdomen: Soft, non-tender. Extremities: Decreasing edema of both hands and forearms. No cyanosis. No clubbing. Bilateral lower legs swelling with dermatosis  CNS: AxOx3, Cranial nerves grossly intact. Right handed. Paraplegic Skin: Warm and dry.  Disposition: 01-Home or Self Care     Medication List    STOP taking these medications        olmesartan-hydrochlorothiazide 20-12.5 MG per tablet  Commonly known as:  BENICAR HCT      TAKE these medications        aspirin 81 MG EC tablet  Take 1 tablet (81 mg total) by mouth daily.     baclofen 10 MG tablet  Commonly known as:  LIORESAL  Take 0.5 tablets (5 mg total) by mouth 3 (three) times daily.     ciprofloxacin 500 MG tablet  Commonly known as:  CIPRO  Take 1 tablet (500 mg total) by mouth 2 (two) times daily.     diphenhydramine-acetaminophen 25-500 MG Tabs  Commonly known as:  TYLENOL PM  Take 1 tablet by mouth at bedtime as needed (sleep/pain).     glipiZIDE 5 MG tablet  Commonly known  as:  GLUCOTROL  Take 0.5 tablets (2.5 mg total) by mouth daily before breakfast.     HYDROcodone-acetaminophen 5-325 MG per tablet  Commonly known as:  NORCO/VICODIN  Take 2 tablets by mouth every 4 (four) hours as needed.     ibuprofen 400 MG tablet  Commonly known as:  ADVIL,MOTRIN  Take 1 tablet (400 mg total) by mouth 2 (two) times daily with a meal.         methocarbamol 500 MG tablet  Commonly known as:  ROBAXIN  Take 1 tablet (500 mg total) by mouth 2 (two) times daily as needed for muscle spasms.     potassium chloride 10 MEQ tablet  Commonly known as:  K-DUR  Take 1 tablet (10 mEq total) by mouth daily.          Ferrous sulfate 324 mg. One daily.     Follow-up Information    Follow up with [facility] S, MD. Schedule an appointment as soon as possible for a visit in 1 week.   Specialty:  Cardiology   Contact information:   [place] [address] [phone]       Signed: Birdie Sanchez 12/04/2014, 10:39 AM

## 2014-12-04 NOTE — Plan of Care (Signed)
Problem: Phase I Progression Outcomes Goal: OOB as tolerated unless otherwise ordered Outcome: Not Met (add Reason) Pt paraplegic

## 2014-12-04 NOTE — Progress Notes (Signed)
CSW consulted for transportation needs. Patient will need non-emergency ambulance transport home. CSW confirmed home address with patient.   [facility] called for transport.   No other CSW needs identified - CSW signing off.   Rilyn Upshaw, LCSW  [facility] Clinical Social Worker cell #: [phone]     

## 2015-01-07 ENCOUNTER — Encounter (HOSPITAL_COMMUNITY): Payer: Self-pay | Admitting: Emergency Medicine

## 2015-01-07 ENCOUNTER — Emergency Department (HOSPITAL_COMMUNITY)
Admission: EM | Admit: 2015-01-07 | Discharge: 2015-01-08 | Disposition: A | Discharge status: Home / Self Care | Payer: Medicare Other | Attending: Emergency Medicine | Admitting: Emergency Medicine

## 2015-01-07 DIAGNOSIS — E119 Type 2 diabetes mellitus without complications: Secondary | ICD-10-CM | POA: Diagnosis not present

## 2015-01-07 DIAGNOSIS — D649 Anemia, unspecified: Secondary | ICD-10-CM | POA: Insufficient documentation

## 2015-01-07 DIAGNOSIS — Z8669 Personal history of other diseases of the nervous system and sense organs: Secondary | ICD-10-CM | POA: Diagnosis not present

## 2015-01-07 DIAGNOSIS — Z87891 Personal history of nicotine dependence: Secondary | ICD-10-CM | POA: Insufficient documentation

## 2015-01-07 DIAGNOSIS — M546 Pain in thoracic spine: Secondary | ICD-10-CM | POA: Diagnosis present

## 2015-01-07 DIAGNOSIS — Z79899 Other long term (current) drug therapy: Secondary | ICD-10-CM | POA: Insufficient documentation

## 2015-01-07 DIAGNOSIS — I10 Essential (primary) hypertension: Secondary | ICD-10-CM | POA: Diagnosis not present

## 2015-01-07 DIAGNOSIS — Z993 Dependence on wheelchair: Secondary | ICD-10-CM | POA: Insufficient documentation

## 2015-01-07 DIAGNOSIS — M62830 Muscle spasm of back: Secondary | ICD-10-CM | POA: Insufficient documentation

## 2015-01-07 DIAGNOSIS — Z7982 Long term (current) use of aspirin: Secondary | ICD-10-CM | POA: Insufficient documentation

## 2015-01-07 MED ORDER — DIAZEPAM 5 MG PO TABS
5.0000 mg | ORAL_TABLET | Freq: Once | ORAL | Status: AC
  Administered 2015-01-07: 5 mg via ORAL
  Filled 2015-01-07: qty 1

## 2015-01-07 MED ORDER — METHOCARBAMOL 500 MG PO TABS: 500.0000 mg | ORAL_TABLET | Freq: Two times a day (BID) | ORAL | Status: DC | PRN

## 2015-01-07 MED ORDER — KETOROLAC TROMETHAMINE 10 MG PO TABS
10.0000 mg | ORAL_TABLET | Freq: Once | ORAL | Status: AC
  Administered 2015-01-07: 10 mg via ORAL
  Filled 2015-01-07: qty 1

## 2015-01-07 NOTE — ED Notes (Signed)
Pt from home via EMS with complaints of back pain. Pt is wheelchair bound and states her back has been hurting since last night. EMS repositioned her and used a rolled up towel to help prop her up which she stated decreased her pain. She took 400 mg of ibuprofen around 5pm and she states this did not decrease her pain. Pt has a hx of MS.

## 2015-01-07 NOTE — ED Notes (Signed)
Bed: NI62 Expected date:  Expected time:  Means of arrival:  Comments: Ems- MS/ back pain

## 2015-01-07 NOTE — ED Provider Notes (Signed)
CSN: [id number]     Arrival date & time 01/07/15  1934 History   None    Chief Complaint  Patient presents with  . Back Pain    Pt is wheelchair bound and EMS reports she stated the pain was decreased by changing positions and proping herself up with a rolled up towel     (Consider location/radiation/quality/duration/timing/severity/associated sxs/prior Treatment) HPI   Patient is a 59 year old female with history of MS, hypertension, diabetes, bilateral lymphedema, who is wheelchair-bound, who presents with muscle spasm that suddenly began last night, located in the right mid back, and occurred after a transfer jarred her body.  Patient has taken ibuprofen and home narcotics with mild improvement in her back pain. There is no radiation of the pain, patient had no new weakness, numbness or tingling. Patient denies any fever, chills, sweats, shortness of breath or chest pain.  When she got to the ER she was given a heating pad with significant relief of her muscle tension and pain.    Past Medical History  Diagnosis Date  . MS (multiple sclerosis)   . HTN (hypertension)   . DM II (diabetes mellitus, type II), controlled   . Lymphedema   . Anemia   . Gait disorder    History reviewed. No pertinent past surgical history. Family History  Problem Relation Age of Onset  . Multiple sclerosis Mother    History  Substance Use Topics  . Smoking status: Former Smoker    Types: Cigarettes  . Smokeless tobacco: Never Used  . Alcohol Use: No   OB History    Gravida Para Term Preterm AB TAB SAB Ectopic Multiple Living   3 3 0 0 0 0 0 0 0 0      Review of Systems 10 Systems reviewed and are negative for acute change except as noted in the HPI.    Allergies  Sulfa antibiotics  Home Medications   Prior to Admission medications   Medication Sig Start Date End Date Taking? Authorizing Provider  aspirin EC 81 MG EC tablet Take 1 tablet (81 mg total) by mouth daily. 12/04/14  Yes Dixie Dials, MD  baclofen (LIORESAL) 10 MG tablet Take 0.5 tablets (5 mg total) by mouth 3 (three) times daily. 05/14/13  Yes Dennie Bible, NP  BENICAR HCT 20-12.5 MG per tablet Take 1 tablet by mouth daily. 01/01/15  Yes Historical Provider, MD  diphenhydramine-acetaminophen (TYLENOL PM) 25-500 MG TABS Take 1 tablet by mouth at bedtime as needed (sleep/pain).   Yes Historical Provider, MD  ferrous sulfate (FERROUSUL) 325 (65 FE) MG tablet Take 1 tablet (325 mg total) by mouth daily with breakfast. 12/04/14  Yes Dixie Dials, MD  glipiZIDE (GLUCOTROL) 5 MG tablet Take 0.5 tablets (2.5 mg total) by mouth daily before breakfast. Patient taking differently: Take 5 mg by mouth daily before breakfast.  12/04/14  Yes Dixie Dials, MD  HYDROcodone-acetaminophen (NORCO/VICODIN) 5-325 MG per tablet Take 2 tablets by mouth every 4 (four) hours as needed. Patient taking differently: Take 2 tablets by mouth every 4 (four) hours as needed for moderate pain.  10/24/14  Yes Noemi Chapel, MD  ibuprofen (ADVIL,MOTRIN) 400 MG tablet Take 1 tablet (400 mg total) by mouth 2 (two) times daily with a meal. 12/04/14  Yes Dixie Dials, MD  methocarbamol (ROBAXIN) 500 MG tablet Take 1 tablet (500 mg total) by mouth 2 (two) times daily as needed for muscle spasms. Patient taking differently: Take 500 mg by mouth 3 (three) times  daily.  10/24/14  Yes Noemi Chapel, MD  potassium chloride (K-DUR) 10 MEQ tablet Take 1 tablet (10 mEq total) by mouth daily. 12/04/14  Yes Dixie Dials, MD  ciprofloxacin (CIPRO) 500 MG tablet Take 1 tablet (500 mg total) by mouth 2 (two) times daily. Patient not taking: Reported on 01/07/2015 12/04/14   Dixie Dials, MD   BP 105/46 mmHg  Pulse 97  Temp(Src) 97.4 F (36.3 C) (Oral)  Resp 18  SpO2 100% Physical Exam  Constitutional: She is oriented to person, place, and time. She appears well-developed and well-nourished. No distress.  Chronically ill-appearing female, appears stated age, nontoxic  appearing, in NAD  HENT:  Head: Normocephalic and atraumatic.  Nose: Nose normal.  Mouth/Throat: Oropharynx is clear and moist. No oropharyngeal exudate.  Eyes: Conjunctivae and EOM are normal. Pupils are equal, round, and reactive to light. Right eye exhibits no discharge. Left eye exhibits no discharge. No scleral icterus.  Neck: Normal range of motion. No JVD present. No tracheal deviation present. No thyromegaly present.  Cardiovascular: Normal rate, regular rhythm, normal heart sounds and intact distal pulses.  Exam reveals no gallop and no friction rub.   No murmur heard. Pulmonary/Chest: Effort normal and breath sounds normal. No respiratory distress. She has no wheezes. She has no rales. She exhibits no tenderness.  Abdominal: Soft. Bowel sounds are normal. She exhibits no distension and no mass. There is no tenderness. There is no rebound and no guarding.  Musculoskeletal: Normal range of motion. She exhibits no edema or tenderness.  Bilateral arms folded over her chest and abdomen with limited range of motion Bilateral extremities in dressing with extreme lymphedema No tenderness to palpation and spinal processes from cervical to lumbar spine  Lymphadenopathy:    She has no cervical adenopathy.  Neurological: She is alert and oriented to person, place, and time. No cranial nerve deficit. She exhibits abnormal muscle tone. Coordination abnormal.  Normal sensation in all extremities to light touch   Skin: Skin is warm and dry. No rash noted. She is not diaphoretic. No erythema. No pallor.  Psychiatric: She has a normal mood and affect. Her behavior is normal. Judgment and thought content normal.  Nursing note and vitals reviewed.   ED Course  Procedures (including critical care time) Labs Review Labs Reviewed - No data to display  Imaging Review No results found.   EKG Interpretation None      MDM   Final diagnoses:  None    Patient with muscle spasm which began  last night and was relieved with heating pack applied in the ER.  Patient has home narcotics, will give muscle relaxer.  Patient has multiple medical issues, limited range of motion of all extremities due to MS/lymphadema, no change from her baseline, no tenderness to palpation, no loss of sensation, no change in bladder or bowel control.  No concern for spine pathology. Pt d/c home in stable condition with resolution of pain.    Delsa Grana, PA-C 01/08/15 Home, DO 01/10/15 [id number]

## 2015-01-07 NOTE — ED Notes (Signed)
[facility] called. Pt discharge paperwork reviewed with patient

## 2015-01-07 NOTE — Discharge Instructions (Signed)
Heat Therapy Heat therapy can help ease sore, stiff, injured, and tight muscles and joints. Heat relaxes your muscles, which may help ease your pain.  RISKS AND COMPLICATIONS If you have any of the following conditions, do not use heat therapy unless your health care provider has approved:  Poor circulation.  Healing wounds or scarred skin in the area being treated.  Diabetes, heart disease, or high blood pressure.  Not being able to feel (numbness) the area being treated.  Unusual swelling of the area being treated.  Active infections.  Blood clots.  Cancer.  Inability to communicate pain. This may include young children and people who have problems with their brain function (dementia).  Pregnancy. Heat therapy should only be used on old, pre-existing, or long-lasting (chronic) injuries. Do not use heat therapy on new injuries unless directed by your health care provider. HOW TO USE HEAT THERAPY There are several different kinds of heat therapy, including:  Moist heat pack.  Warm water bath.  Hot water bottle.  Electric heating pad.  Heated gel pack.  Heated wrap.  Electric heating pad. Use the heat therapy method suggested by your health care provider. Follow your health care provider's instructions on when and how to use heat therapy. GENERAL HEAT THERAPY RECOMMENDATIONS  Do not sleep while using heat therapy. Only use heat therapy while you are awake.  Your skin may turn pink while using heat therapy. Do not use heat therapy if your skin turns red.  Do not use heat therapy if you have new pain.  High heat or long exposure to heat can cause burns. Be careful when using heat therapy to avoid burning your skin.  Do not use heat therapy on areas of your skin that are already irritated, such as with a rash or sunburn. SEEK MEDICAL CARE IF:  You have blisters, redness, swelling, or numbness.  You have new pain.  Your pain is worse. MAKE SURE  YOU:  Understand these instructions.  Will watch your condition.  Will get help right away if you are not doing well or get worse. Document Released: 08/28/2011 Document Revised: 10/20/2013 Document Reviewed: 07/29/2013 [facility] Patient Information 2015 [place], [place]. This information is not intended to replace advice given to you by your health care provider. Make sure you discuss any questions you have with your health care provider.  Muscle Cramps and Spasms Muscle cramps and spasms occur when a muscle or muscles tighten and you have no control over this tightening (involuntary muscle contraction). They are a common problem and can develop in any muscle. The most common place is in the calf muscles of the leg. Both muscle cramps and muscle spasms are involuntary muscle contractions, but they also have differences:   Muscle cramps are sporadic and painful. They may last a few seconds to a quarter of an hour. Muscle cramps are often more forceful and last longer than muscle spasms.  Muscle spasms may or may not be painful. They may also last just a few seconds or much longer. CAUSES  It is uncommon for cramps or spasms to be due to a serious underlying problem. In many cases, the cause of cramps or spasms is unknown. Some common causes are:   Overexertion.   Overuse from repetitive motions (doing the same thing over and over).   Remaining in a certain position for a long period of time.   Improper preparation, form, or technique while performing a sport or activity.   Dehydration.   Injury.  Side effects of some medicines.   Abnormally low levels of the salts and ions in your blood (electrolytes), especially potassium and calcium. This could happen if you are taking water pills (diuretics) or you are pregnant.  Some underlying medical problems can make it more likely to develop cramps or spasms. These include, but are not limited to:   Diabetes.   Parkinson disease.    Hormone disorders, such as thyroid problems.   Alcohol abuse.   Diseases specific to muscles, joints, and bones.   Blood vessel disease where not enough blood is getting to the muscles.  HOME CARE INSTRUCTIONS   Stay well hydrated. Drink enough water and fluids to keep your urine clear or pale yellow.  It may be helpful to massage, stretch, and relax the affected muscle.  For tight or tense muscles, use a warm towel, heating pad, or hot shower water directed to the affected area.  If you are sore or have pain after a cramp or spasm, applying ice to the affected area may relieve discomfort.  Put ice in a plastic bag.  Place a towel between your skin and the bag.  Leave the ice on for 15-20 minutes, 03-04 times a day.  Medicines used to treat a known cause of cramps or spasms may help reduce their frequency or severity. Only take over-the-counter or prescription medicines as directed by your caregiver. SEEK MEDICAL CARE IF:  Your cramps or spasms get more severe, more frequent, or do not improve over time.  MAKE SURE YOU:   Understand these instructions.  Will watch your condition.  Will get help right away if you are not doing well or get worse. Document Released: 11/25/2001 Document Revised: 09/30/2012 Document Reviewed: 05/22/2012 [facility] Patient Information 2015 [place], [place]. This information is not intended to replace advice given to you by your health care provider. Make sure you discuss any questions you have with your health care provider.

## 2015-02-17 ENCOUNTER — Emergency Department (HOSPITAL_COMMUNITY)
Admission: EM | Admit: 2015-02-17 | Discharge: 2015-02-18 | Disposition: A | Discharge status: Home / Self Care | Payer: Medicare Other | Attending: Emergency Medicine | Admitting: Emergency Medicine

## 2015-02-17 ENCOUNTER — Encounter (HOSPITAL_COMMUNITY): Payer: Self-pay | Admitting: Emergency Medicine

## 2015-02-17 DIAGNOSIS — Z87891 Personal history of nicotine dependence: Secondary | ICD-10-CM | POA: Insufficient documentation

## 2015-02-17 DIAGNOSIS — Z4800 Encounter for change or removal of nonsurgical wound dressing: Secondary | ICD-10-CM | POA: Diagnosis not present

## 2015-02-17 DIAGNOSIS — I10 Essential (primary) hypertension: Secondary | ICD-10-CM | POA: Insufficient documentation

## 2015-02-17 DIAGNOSIS — E119 Type 2 diabetes mellitus without complications: Secondary | ICD-10-CM | POA: Diagnosis not present

## 2015-02-17 DIAGNOSIS — Z791 Long term (current) use of non-steroidal anti-inflammatories (NSAID): Secondary | ICD-10-CM | POA: Insufficient documentation

## 2015-02-17 DIAGNOSIS — Z8669 Personal history of other diseases of the nervous system and sense organs: Secondary | ICD-10-CM | POA: Insufficient documentation

## 2015-02-17 DIAGNOSIS — Z7982 Long term (current) use of aspirin: Secondary | ICD-10-CM | POA: Insufficient documentation

## 2015-02-17 DIAGNOSIS — Z79899 Other long term (current) drug therapy: Secondary | ICD-10-CM | POA: Insufficient documentation

## 2015-02-17 DIAGNOSIS — D649 Anemia, unspecified: Secondary | ICD-10-CM | POA: Diagnosis not present

## 2015-02-17 DIAGNOSIS — R2243 Localized swelling, mass and lump, lower limb, bilateral: Secondary | ICD-10-CM | POA: Diagnosis present

## 2015-02-17 DIAGNOSIS — R600 Localized edema: Secondary | ICD-10-CM | POA: Insufficient documentation

## 2015-02-17 DIAGNOSIS — R609 Edema, unspecified: Secondary | ICD-10-CM

## 2015-02-17 LAB — URINALYSIS, ROUTINE W REFLEX MICROSCOPIC
Bilirubin Urine: NEGATIVE
Glucose, UA: NEGATIVE mg/dL
Ketones, ur: NEGATIVE mg/dL
Nitrite: NEGATIVE
Protein, ur: NEGATIVE mg/dL
SPECIFIC GRAVITY, URINE: 1.017 (ref 1.005–1.030)
UROBILINOGEN UA: 0.2 mg/dL (ref 0.0–1.0)
pH: 5.5 (ref 5.0–8.0)

## 2015-02-17 LAB — CBC WITH DIFFERENTIAL/PLATELET
Basophils Absolute: 0 10*3/uL (ref 0.0–0.1)
Basophils Relative: 1 % (ref 0–1)
Eosinophils Absolute: 0.3 10*3/uL (ref 0.0–0.7)
Eosinophils Relative: 4 % (ref 0–5)
HCT: 30.7 % — ABNORMAL LOW (ref 36.0–46.0)
HEMOGLOBIN: 10.2 g/dL — AB (ref 12.0–15.0)
LYMPHS ABS: 1.6 10*3/uL (ref 0.7–4.0)
LYMPHS PCT: 21 % (ref 12–46)
MCH: 27.1 pg (ref 26.0–34.0)
MCHC: 33.2 g/dL (ref 30.0–36.0)
MCV: 81.6 fL (ref 78.0–100.0)
MONOS PCT: 11 % (ref 3–12)
Monocytes Absolute: 0.9 10*3/uL (ref 0.1–1.0)
NEUTROS PCT: 63 % (ref 43–77)
Neutro Abs: 5 10*3/uL (ref 1.7–7.7)
Platelets: 573 10*3/uL — ABNORMAL HIGH (ref 150–400)
RBC: 3.76 MIL/uL — ABNORMAL LOW (ref 3.87–5.11)
RDW: 15 % (ref 11.5–15.5)
WBC: 7.8 10*3/uL (ref 4.0–10.5)

## 2015-02-17 LAB — BASIC METABOLIC PANEL
Anion gap: 11 (ref 5–15)
BUN: 71 mg/dL — AB (ref 6–20)
CHLORIDE: 108 mmol/L (ref 101–111)
CO2: 17 mmol/L — AB (ref 22–32)
Calcium: 9.3 mg/dL (ref 8.9–10.3)
Creatinine, Ser: 0.84 mg/dL (ref 0.44–1.00)
GFR calc Af Amer: >60 mL/min (ref >60)
GFR calc non Af Amer: >60 mL/min (ref >60)
Glucose, Bld: 50 mg/dL — ABNORMAL LOW (ref 65–99)
POTASSIUM: 3.6 mmol/L (ref 3.5–5.1)
SODIUM: 136 mmol/L (ref 135–145)

## 2015-02-17 LAB — CBG MONITORING, ED
Glucose-Capillary: 155 mg/dL — ABNORMAL HIGH (ref 65–99)
Glucose-Capillary: 32 mg/dL — CL (ref 65–99)

## 2015-02-17 LAB — URINE MICROSCOPIC-ADD ON

## 2015-02-17 MED ORDER — DEXTROSE 50 % IV SOLN
50.0000 mL | Freq: Once | INTRAVENOUS | Status: AC
  Administered 2015-02-17: 50 mL via INTRAVENOUS
  Filled 2015-02-17: qty 50

## 2015-02-17 NOTE — ED Notes (Signed)
Bed: KN39 Expected date:  Expected time:  Means of arrival:  Comments: EMS 59 yo female from home/lower extremity edema with weeping/open sore lower extremity

## 2015-02-17 NOTE — Discharge Instructions (Signed)
Peripheral Edema °You have swelling in your legs (peripheral edema). This swelling is due to excess accumulation of salt and water in your body. Edema may be a sign of heart, kidney or liver disease, or a side effect of a medication. It may also be due to problems in the leg veins. Elevating your legs and using special support stockings may be very helpful, if the cause of the swelling is due to poor venous circulation. Avoid long periods of standing, whatever the cause. °Treatment of edema depends on identifying the cause. Chips, pretzels, pickles and other salty foods should be avoided. Restricting salt in your diet is almost always needed. Water pills (diuretics) are often used to remove the excess salt and water from your body via urine. These medicines prevent the kidney from reabsorbing sodium. This increases urine flow. °Diuretic treatment may also result in lowering of potassium levels in your body. Potassium supplements may be needed if you have to use diuretics daily. Daily weights can help you keep track of your progress in clearing your edema. You should call your caregiver for follow up care as recommended. °SEEK IMMEDIATE MEDICAL CARE IF:  °· You have increased swelling, pain, redness, or heat in your legs. °· You develop shortness of breath, especially when lying down. °· You develop chest or abdominal pain, weakness, or fainting. °· You have a fever. °Document Released: 07/13/2004 Document Revised: 08/28/2011 Document Reviewed: 06/23/2009 °[employer]® Patient Information ©2015 [employer]. This information is not intended to replace advice given to you by your health care provider. Make sure you discuss any questions you have with your health care provider. ° °

## 2015-02-17 NOTE — ED Notes (Signed)
Patients blood sugar taken.  cbg reading 32.  Beth notified.

## 2015-02-17 NOTE — ED Notes (Signed)
cbg taken.  Reading 155.  Beth advised.

## 2015-02-17 NOTE — ED Notes (Signed)
Pt eating peanut butter crackers and sprite zero. Will reassess CBG in 30 minutes.

## 2015-02-17 NOTE — ED Notes (Signed)
Per EMS- presents with bilateral lower leg swelling and weeping. Warm to touch with redness noted. Also suspected UTI. Patient is from home. Wheelchair confined. VS: BP 108/76 HR 86 RR 18 CBG 77 mg/dl. Hx DM. Pt also has open ulcer on posterior left leg with white purulent drainage x1 week. Family has been "wrapping wound." Patient presents with increased lethargy x2 days and decreased appetite x1 day.

## 2015-02-17 NOTE — ED Notes (Signed)
In an out cath performed by beth.  Lattie Haw and [employer] assisted.

## 2015-02-17 NOTE — ED Provider Notes (Signed)
CSN: [id number]     Arrival date & time 02/17/15  1931 History   First MD Initiated Contact with Patient 02/17/15 1958     Chief Complaint  Patient presents with  . Leg Swelling  . Wound Check     (Consider location/radiation/quality/duration/timing/severity/associated sxs/prior Treatment) HPI   Jocelyn Sanchez is a 59 y.o. female who presents for evaluation of leg swelling with concern for infection. Apparently the paramedics that brought her here were concerned about a foul urine odor is well. Patient states that her aide: The most tonight because the right lower leg started to show some redness at the upper aspect. Apparently the aide also saw some "worms" on her legs." Patient is chronically bedbound and has chronic severe lower extremity edema. Patient denies nausea, vomiting, fever, chills, cough, shortness of breath or chest pain. She has been eating well. There are no other known modifying factors.    Past Medical History  Diagnosis Date  . MS (multiple sclerosis)   . HTN (hypertension)   . DM II (diabetes mellitus, type II), controlled   . Lymphedema   . Anemia   . Gait disorder    History reviewed. No pertinent past surgical history. Family History  Problem Relation Age of Onset  . Multiple sclerosis Mother    Social History  Substance Use Topics  . Smoking status: Former Smoker    Types: Cigarettes  . Smokeless tobacco: Never Used  . Alcohol Use: No   OB History    Gravida Para Term Preterm AB TAB SAB Ectopic Multiple Living   3 3 0 0 0 0 0 0 0 0      Review of Systems  All other systems reviewed and are negative.     Allergies  Sulfa antibiotics  Home Medications   Prior to Admission medications   Medication Sig Start Date End Date Taking? Authorizing Provider  aspirin EC 81 MG EC tablet Take 1 tablet (81 mg total) by mouth daily. 12/04/14  Yes Dixie Dials, MD  baclofen (LIORESAL) 10 MG tablet Take 0.5 tablets (5 mg total) by mouth 3 (three) times  daily. 05/14/13  Yes Dennie Bible, NP  BENICAR HCT 20-12.5 MG per tablet Take 1 tablet by mouth daily. 01/01/15  Yes Historical Provider, MD  diphenhydramine-acetaminophen (TYLENOL PM) 25-500 MG TABS Take 1 tablet by mouth at bedtime as needed (sleep/pain).   Yes Historical Provider, MD  ferrous sulfate (FERROUSUL) 325 (65 FE) MG tablet Take 1 tablet (325 mg total) by mouth daily with breakfast. 12/04/14  Yes Dixie Dials, MD  glipiZIDE (GLUCOTROL) 5 MG tablet Take 0.5 tablets (2.5 mg total) by mouth daily before breakfast. Patient taking differently: Take 5 mg by mouth 2 (two) times daily before a meal.  12/04/14  Yes Dixie Dials, MD  HYDROcodone-acetaminophen (NORCO/VICODIN) 5-325 MG per tablet Take 2 tablets by mouth every 4 (four) hours as needed. Patient taking differently: Take 2 tablets by mouth every 4 (four) hours as needed for moderate pain.  10/24/14  Yes Noemi Chapel, MD  ibuprofen (ADVIL,MOTRIN) 400 MG tablet Take 1 tablet (400 mg total) by mouth 2 (two) times daily with a meal. 12/04/14  Yes Dixie Dials, MD  methocarbamol (ROBAXIN) 500 MG tablet Take 1 tablet (500 mg total) by mouth 2 (two) times daily as needed for muscle spasms. Patient taking differently: Take 500 mg by mouth every 8 (eight) hours as needed for muscle spasms.  01/07/15  Yes Delsa Grana, PA-C  ciprofloxacin (CIPRO) 500 MG  tablet Take 1 tablet (500 mg total) by mouth 2 (two) times daily. Patient not taking: Reported on 01/07/2015 12/04/14   Dixie Dials, MD  potassium chloride (K-DUR) 10 MEQ tablet Take 1 tablet (10 mEq total) by mouth daily. Patient not taking: Reported on 02/17/2015 12/04/14   Dixie Dials, MD   BP 114/56 mmHg  Pulse 105  Temp(Src) 97.5 F (36.4 C) (Oral)  Resp 18  SpO2 99% Physical Exam  Constitutional: She is oriented to person, place, and time. She appears well-developed and well-nourished.  HENT:  Head: Normocephalic and atraumatic.  Right Ear: External ear normal.  Left Ear: External  ear normal.  Eyes: Conjunctivae and EOM are normal. Pupils are equal, round, and reactive to light.  Neck: Normal range of motion and phonation normal. Neck supple.  Cardiovascular: Normal rate, regular rhythm and normal heart sounds.   Pulmonary/Chest: Effort normal and breath sounds normal. She exhibits no bony tenderness.  Abdominal: Soft. There is no tenderness.  Musculoskeletal: She exhibits no tenderness.  Chronic edema. Lungs trace bilaterally with skin thickening, and darkening appearance. Very trace edema of the right lower leg at the upper aspect of her skin changes. No evident parasites, or maggots in the skin. See picture below.  Neurological: She is alert and oriented to person, place, and time. No cranial nerve deficit or sensory deficit.  Quadriparesis  Skin: Skin is warm, dry and intact.  Psychiatric: She has a normal mood and affect. Her behavior is normal.  Nursing note and vitals reviewed.   ED Course  Procedures (including critical care time)      Labs Review Labs Reviewed  URINALYSIS, ROUTINE W REFLEX MICROSCOPIC (NOT AT [facility]) - Abnormal; Notable for the following:    Hgb urine dipstick LARGE (*)    Leukocytes, UA SMALL (*)    All other components within normal limits  BASIC METABOLIC PANEL - Abnormal; Notable for the following:    CO2 17 (*)    Glucose, Bld 50 (*)    BUN 71 (*)    All other components within normal limits  CBC WITH DIFFERENTIAL/PLATELET - Abnormal; Notable for the following:    RBC 3.76 (*)    Hemoglobin 10.2 (*)    HCT 30.7 (*)    Platelets 573 (*)    All other components within normal limits  URINE MICROSCOPIC-ADD ON - Abnormal; Notable for the following:    Bacteria, UA FEW (*)    All other components within normal limits  CBG MONITORING, ED - Abnormal; Notable for the following:    Glucose-Capillary 32 (*)    All other components within normal limits  CBG MONITORING, ED - Abnormal; Notable for the following:    Glucose-Capillary  155 (*)    All other components within normal limits  URINE CULTURE    BUN  Date Value Ref Range Status  02/17/2015 71* 6 - 20 mg/dL Final  11/29/2014 46* 6 - 20 mg/dL Final  11/28/2014 77* 6 - 20 mg/dL Final  03/08/2012 15 6 - 23 mg/dL Final   CREATININE, SER  Date Value Ref Range Status  02/17/2015 0.84 0.44 - 1.00 mg/dL Final  11/29/2014 0.57 0.44 - 1.00 mg/dL Final  11/28/2014 0.98 0.44 - 1.00 mg/dL Final  03/08/2012 1.30* 0.50 - 1.10 mg/dL Final    HEMOGLOBIN  Date Value Ref Range Status  02/17/2015 10.2* 12.0 - 15.0 g/dL Final  11/29/2014 9.6* 12.0 - 15.0 g/dL Final  11/28/2014 10.9* 12.0 - 15.0 g/dL Final  03/08/2012  8.5* 12.0 - 15.0 g/dL Final     Imaging Review No results found. I have personally reviewed and evaluated these images and lab results as part of my medical decision-making.   EKG Interpretation None      MDM   Final diagnoses:  Peripheral edema    Peripheral edema, chronic. No evidence for acute infection of the lower extremities. Evaluation for follow urine does not indicate infection/UTI at this time. She does not have fever or elevated white blood cell count. A urine culture was ordered for screening purposes. If it returns as positive, the patient does not necessarily need to be treated, and less she develops a fever, or other clear signs for urinary tract infection. She has chronic elevation of BUN likely secondary to diuresis. Hb is at baseline.  Nursing Notes Reviewed/ Care Coordinated Applicable Imaging Reviewed Interpretation of Laboratory Data incorporated into ED treatment  The patient appears reasonably screened and/or stabilized for discharge and I doubt any other medical condition or other [facility] requiring further screening, evaluation, or treatment in the ED at this time prior to discharge.  Plan: Home Medications- usual; Home Treatments- rest; return here if the recommended treatment, does not improve the symptoms; Recommended  follow up- PCP prn   Daleen Bo, MD 02/18/15 [phone]

## 2015-02-17 NOTE — ED Notes (Signed)
Given 240 cc OJ. Will re-check CBG promptly.

## 2015-02-19 LAB — URINE CULTURE
CULTURE: NO GROWTH
Special Requests: NORMAL

## 2015-03-01 ENCOUNTER — Encounter (HOSPITAL_COMMUNITY): Payer: Self-pay | Admitting: *Deleted

## 2015-03-01 ENCOUNTER — Emergency Department (HOSPITAL_COMMUNITY): Payer: Medicare Other

## 2015-03-01 ENCOUNTER — Inpatient Hospital Stay (HOSPITAL_COMMUNITY)
Admission: EM | Admit: 2015-03-01 | Discharge: 2015-03-12 | DRG: 871 | Disposition: A | Discharge status: Home Health | Payer: Medicare Other | Attending: Internal Medicine | Admitting: Internal Medicine

## 2015-03-01 ENCOUNTER — Inpatient Hospital Stay (HOSPITAL_COMMUNITY): Payer: Medicare Other

## 2015-03-01 DIAGNOSIS — E872 Acidosis: Secondary | ICD-10-CM | POA: Diagnosis present

## 2015-03-01 DIAGNOSIS — N179 Acute kidney failure, unspecified: Secondary | ICD-10-CM | POA: Diagnosis present

## 2015-03-01 DIAGNOSIS — R6520 Severe sepsis without septic shock: Secondary | ICD-10-CM

## 2015-03-01 DIAGNOSIS — I872 Venous insufficiency (chronic) (peripheral): Secondary | ICD-10-CM | POA: Diagnosis present

## 2015-03-01 DIAGNOSIS — E669 Obesity, unspecified: Secondary | ICD-10-CM | POA: Diagnosis present

## 2015-03-01 DIAGNOSIS — Z882 Allergy status to sulfonamides status: Secondary | ICD-10-CM | POA: Diagnosis not present

## 2015-03-01 DIAGNOSIS — B9689 Other specified bacterial agents as the cause of diseases classified elsewhere: Secondary | ICD-10-CM | POA: Diagnosis present

## 2015-03-01 DIAGNOSIS — R509 Fever, unspecified: Secondary | ICD-10-CM | POA: Diagnosis not present

## 2015-03-01 DIAGNOSIS — G8250 Quadriplegia, unspecified: Secondary | ICD-10-CM | POA: Diagnosis present

## 2015-03-01 DIAGNOSIS — A498 Other bacterial infections of unspecified site: Secondary | ICD-10-CM | POA: Diagnosis present

## 2015-03-01 DIAGNOSIS — A419 Sepsis, unspecified organism: Secondary | ICD-10-CM | POA: Diagnosis not present

## 2015-03-01 DIAGNOSIS — Z820 Family history of epilepsy and other diseases of the nervous system: Secondary | ICD-10-CM

## 2015-03-01 DIAGNOSIS — A4151 Sepsis due to Escherichia coli [E. coli]: Secondary | ICD-10-CM | POA: Diagnosis present

## 2015-03-01 DIAGNOSIS — R6521 Severe sepsis with septic shock: Secondary | ICD-10-CM | POA: Diagnosis present

## 2015-03-01 DIAGNOSIS — M549 Dorsalgia, unspecified: Secondary | ICD-10-CM

## 2015-03-01 DIAGNOSIS — B9620 Unspecified Escherichia coli [E. coli] as the cause of diseases classified elsewhere: Secondary | ICD-10-CM | POA: Diagnosis present

## 2015-03-01 DIAGNOSIS — R197 Diarrhea, unspecified: Secondary | ICD-10-CM | POA: Diagnosis not present

## 2015-03-01 DIAGNOSIS — L89153 Pressure ulcer of sacral region, stage 3: Secondary | ICD-10-CM | POA: Diagnosis present

## 2015-03-01 DIAGNOSIS — E119 Type 2 diabetes mellitus without complications: Secondary | ICD-10-CM

## 2015-03-01 DIAGNOSIS — E876 Hypokalemia: Secondary | ICD-10-CM | POA: Diagnosis present

## 2015-03-01 DIAGNOSIS — I10 Essential (primary) hypertension: Secondary | ICD-10-CM | POA: Diagnosis present

## 2015-03-01 DIAGNOSIS — J90 Pleural effusion, not elsewhere classified: Secondary | ICD-10-CM | POA: Diagnosis not present

## 2015-03-01 DIAGNOSIS — D649 Anemia, unspecified: Secondary | ICD-10-CM | POA: Diagnosis present

## 2015-03-01 DIAGNOSIS — L03115 Cellulitis of right lower limb: Secondary | ICD-10-CM | POA: Diagnosis present

## 2015-03-01 DIAGNOSIS — Z79891 Long term (current) use of opiate analgesic: Secondary | ICD-10-CM | POA: Diagnosis not present

## 2015-03-01 DIAGNOSIS — I878 Other specified disorders of veins: Secondary | ICD-10-CM | POA: Diagnosis present

## 2015-03-01 DIAGNOSIS — Z6833 Body mass index (BMI) 33.0-33.9, adult: Secondary | ICD-10-CM

## 2015-03-01 DIAGNOSIS — L03116 Cellulitis of left lower limb: Secondary | ICD-10-CM | POA: Diagnosis present

## 2015-03-01 DIAGNOSIS — D72829 Elevated white blood cell count, unspecified: Secondary | ICD-10-CM | POA: Diagnosis present

## 2015-03-01 DIAGNOSIS — M7989 Other specified soft tissue disorders: Secondary | ICD-10-CM | POA: Diagnosis present

## 2015-03-01 DIAGNOSIS — E875 Hyperkalemia: Secondary | ICD-10-CM | POA: Diagnosis not present

## 2015-03-01 DIAGNOSIS — S81802D Unspecified open wound, left lower leg, subsequent encounter: Secondary | ICD-10-CM | POA: Diagnosis not present

## 2015-03-01 DIAGNOSIS — N3000 Acute cystitis without hematuria: Secondary | ICD-10-CM | POA: Diagnosis not present

## 2015-03-01 DIAGNOSIS — M546 Pain in thoracic spine: Secondary | ICD-10-CM | POA: Diagnosis not present

## 2015-03-01 DIAGNOSIS — N390 Urinary tract infection, site not specified: Secondary | ICD-10-CM | POA: Diagnosis not present

## 2015-03-01 DIAGNOSIS — R7881 Bacteremia: Secondary | ICD-10-CM | POA: Diagnosis not present

## 2015-03-01 DIAGNOSIS — M2450 Contracture, unspecified joint: Secondary | ICD-10-CM | POA: Diagnosis present

## 2015-03-01 DIAGNOSIS — Z79899 Other long term (current) drug therapy: Secondary | ICD-10-CM | POA: Diagnosis not present

## 2015-03-01 DIAGNOSIS — G35 Multiple sclerosis: Secondary | ICD-10-CM | POA: Diagnosis present

## 2015-03-01 DIAGNOSIS — Z7982 Long term (current) use of aspirin: Secondary | ICD-10-CM

## 2015-03-01 DIAGNOSIS — L8990 Pressure ulcer of unspecified site, unspecified stage: Secondary | ICD-10-CM | POA: Diagnosis not present

## 2015-03-01 DIAGNOSIS — I890 Lymphedema, not elsewhere classified: Secondary | ICD-10-CM

## 2015-03-01 DIAGNOSIS — L89152 Pressure ulcer of sacral region, stage 2: Secondary | ICD-10-CM | POA: Diagnosis present

## 2015-03-01 DIAGNOSIS — E1151 Type 2 diabetes mellitus with diabetic peripheral angiopathy without gangrene: Secondary | ICD-10-CM | POA: Diagnosis present

## 2015-03-01 DIAGNOSIS — M545 Low back pain: Secondary | ICD-10-CM | POA: Diagnosis present

## 2015-03-01 DIAGNOSIS — K6289 Other specified diseases of anus and rectum: Secondary | ICD-10-CM | POA: Diagnosis present

## 2015-03-01 DIAGNOSIS — I959 Hypotension, unspecified: Secondary | ICD-10-CM | POA: Diagnosis present

## 2015-03-01 DIAGNOSIS — E860 Dehydration: Secondary | ICD-10-CM | POA: Diagnosis present

## 2015-03-01 DIAGNOSIS — A4181 Sepsis due to Enterococcus: Secondary | ICD-10-CM | POA: Diagnosis not present

## 2015-03-01 DIAGNOSIS — E878 Other disorders of electrolyte and fluid balance, not elsewhere classified: Secondary | ICD-10-CM | POA: Diagnosis present

## 2015-03-01 DIAGNOSIS — D638 Anemia in other chronic diseases classified elsewhere: Secondary | ICD-10-CM | POA: Diagnosis present

## 2015-03-01 DIAGNOSIS — L8992 Pressure ulcer of unspecified site, stage 2: Secondary | ICD-10-CM | POA: Diagnosis not present

## 2015-03-01 DIAGNOSIS — Z791 Long term (current) use of non-steroidal anti-inflammatories (NSAID): Secondary | ICD-10-CM | POA: Diagnosis not present

## 2015-03-01 DIAGNOSIS — X58XXXD Exposure to other specified factors, subsequent encounter: Secondary | ICD-10-CM | POA: Diagnosis not present

## 2015-03-01 DIAGNOSIS — Z87891 Personal history of nicotine dependence: Secondary | ICD-10-CM

## 2015-03-01 DIAGNOSIS — Z993 Dependence on wheelchair: Secondary | ICD-10-CM | POA: Diagnosis not present

## 2015-03-01 DIAGNOSIS — Z452 Encounter for adjustment and management of vascular access device: Secondary | ICD-10-CM

## 2015-03-01 LAB — CBC WITH DIFFERENTIAL/PLATELET
Basophils Absolute: 0 K/uL (ref 0.0–0.1)
Basophils Relative: 0 % (ref 0–1)
Eosinophils Absolute: 0 K/uL (ref 0.0–0.7)
Eosinophils Relative: 0 % (ref 0–5)
HCT: 20.5 % — ABNORMAL LOW (ref 36.0–46.0)
Hemoglobin: 6.8 g/dL — CL (ref 12.0–15.0)
Lymphocytes Relative: 3 % — ABNORMAL LOW (ref 12–46)
Lymphs Abs: 0.9 K/uL (ref 0.7–4.0)
MCH: 26.9 pg (ref 26.0–34.0)
MCHC: 33.2 g/dL (ref 30.0–36.0)
MCV: 81 fL (ref 78.0–100.0)
Monocytes Absolute: 0.6 K/uL (ref 0.1–1.0)
Monocytes Relative: 2 % — ABNORMAL LOW (ref 3–12)
Neutro Abs: 27.2 K/uL — ABNORMAL HIGH (ref 1.7–7.7)
Neutrophils Relative %: 95 % — ABNORMAL HIGH (ref 43–77)
Platelets: 646 K/uL — ABNORMAL HIGH (ref 150–400)
RBC: 2.53 MIL/uL — ABNORMAL LOW (ref 3.87–5.11)
RDW: 15.3 % (ref 11.5–15.5)
WBC: 28.7 K/uL — ABNORMAL HIGH (ref 4.0–10.5)

## 2015-03-01 LAB — URINALYSIS, ROUTINE W REFLEX MICROSCOPIC
Bilirubin Urine: NEGATIVE
Glucose, UA: NEGATIVE mg/dL
Ketones, ur: NEGATIVE mg/dL
NITRITE: POSITIVE — AB
PH: 5.5 (ref 5.0–8.0)
Protein, ur: 30 mg/dL — AB
SPECIFIC GRAVITY, URINE: 1.013 (ref 1.005–1.030)
Urobilinogen, UA: 0.2 mg/dL (ref 0.0–1.0)

## 2015-03-01 LAB — COMPREHENSIVE METABOLIC PANEL WITH GFR
ALT: 10 U/L — ABNORMAL LOW (ref 14–54)
AST: 15 U/L (ref 15–41)
Albumin: 3 g/dL — ABNORMAL LOW (ref 3.5–5.0)
Alkaline Phosphatase: 68 U/L (ref 38–126)
Anion gap: 14 (ref 5–15)
BUN: 94 mg/dL — ABNORMAL HIGH (ref 6–20)
CO2: 17 mmol/L — ABNORMAL LOW (ref 22–32)
Calcium: 9.3 mg/dL (ref 8.9–10.3)
Chloride: 103 mmol/L (ref 101–111)
Creatinine, Ser: 1.81 mg/dL — ABNORMAL HIGH (ref 0.44–1.00)
GFR calc Af Amer: 34 mL/min — ABNORMAL LOW
GFR calc non Af Amer: 29 mL/min — ABNORMAL LOW
Glucose, Bld: 194 mg/dL — ABNORMAL HIGH (ref 65–99)
Potassium: 4.3 mmol/L (ref 3.5–5.1)
Sodium: 134 mmol/L — ABNORMAL LOW (ref 135–145)
Total Bilirubin: 0.6 mg/dL (ref 0.3–1.2)
Total Protein: 7.8 g/dL (ref 6.5–8.1)

## 2015-03-01 LAB — URINE MICROSCOPIC-ADD ON

## 2015-03-01 LAB — PROTIME-INR
INR: 1.29 (ref 0.00–1.49)
Prothrombin Time: 16.3 seconds — ABNORMAL HIGH (ref 11.6–15.2)

## 2015-03-01 LAB — RETICULOCYTES
RBC.: 3.67 MIL/uL — ABNORMAL LOW (ref 3.87–5.11)
RETIC COUNT ABSOLUTE: 29.4 10*3/uL (ref 19.0–186.0)
Retic Ct Pct: 0.8 % (ref 0.4–3.1)

## 2015-03-01 LAB — LACTIC ACID, PLASMA
LACTIC ACID, VENOUS: 2.4 mmol/L — AB (ref 0.5–2.0)
Lactic Acid, Venous: 1.7 mmol/L (ref 0.5–2.0)
Lactic Acid, Venous: 1.6 mmol/L (ref 0.5–2.0)

## 2015-03-01 LAB — PROCALCITONIN: Procalcitonin: 1.16 ng/mL

## 2015-03-01 LAB — GLUCOSE, CAPILLARY: GLUCOSE-CAPILLARY: 114 mg/dL — AB (ref 65–99)

## 2015-03-01 LAB — MRSA PCR SCREENING: MRSA BY PCR: POSITIVE — AB

## 2015-03-01 LAB — PREPARE RBC (CROSSMATCH)

## 2015-03-01 LAB — ABO/RH: ABO/RH(D): O POS

## 2015-03-01 LAB — BRAIN NATRIURETIC PEPTIDE: B Natriuretic Peptide: 26.5 pg/mL (ref 0.0–100.0)

## 2015-03-01 LAB — CBG MONITORING, ED: Glucose-Capillary: 188 mg/dL — ABNORMAL HIGH (ref 65–99)

## 2015-03-01 LAB — APTT: aPTT: 26 seconds (ref 24–37)

## 2015-03-01 MED ORDER — ALBUTEROL SULFATE (2.5 MG/3ML) 0.083% IN NEBU: 2.5000 mg | INHALATION_SOLUTION | RESPIRATORY_TRACT | Status: DC | PRN

## 2015-03-01 MED ORDER — INSULIN ASPART 100 UNIT/ML ~~LOC~~ SOLN
0.0000 [IU] | Freq: Every day | SUBCUTANEOUS | Status: DC
  Administered 2015-03-02: 2 [IU] via SUBCUTANEOUS

## 2015-03-01 MED ORDER — PANTOPRAZOLE SODIUM 40 MG IV SOLR
40.0000 mg | INTRAVENOUS | Status: DC
  Administered 2015-03-01 – 2015-03-02 (×2): 40 mg via INTRAVENOUS
  Filled 2015-03-01 (×2): qty 40

## 2015-03-01 MED ORDER — MORPHINE SULFATE (PF) 4 MG/ML IV SOLN: 4.0000 mg | Freq: Once | INTRAVENOUS | Status: DC

## 2015-03-01 MED ORDER — PIPERACILLIN-TAZOBACTAM 3.375 G IVPB: 3.3750 g | Freq: Once | INTRAVENOUS | Status: DC

## 2015-03-01 MED ORDER — INSULIN ASPART 100 UNIT/ML ~~LOC~~ SOLN
0.0000 [IU] | Freq: Three times a day (TID) | SUBCUTANEOUS | Status: DC
  Administered 2015-03-02: 1 [IU] via SUBCUTANEOUS
  Administered 2015-03-02 – 2015-03-03 (×3): 2 [IU] via SUBCUTANEOUS
  Administered 2015-03-03 – 2015-03-04 (×2): 1 [IU] via SUBCUTANEOUS
  Administered 2015-03-04 – 2015-03-05 (×5): 2 [IU] via SUBCUTANEOUS
  Administered 2015-03-06 (×2): 1 [IU] via SUBCUTANEOUS
  Administered 2015-03-06: 2 [IU] via SUBCUTANEOUS
  Administered 2015-03-07 (×2): 1 [IU] via SUBCUTANEOUS
  Administered 2015-03-07: 2 [IU] via SUBCUTANEOUS
  Administered 2015-03-10: 1 [IU] via SUBCUTANEOUS

## 2015-03-01 MED ORDER — VANCOMYCIN HCL IN DEXTROSE 1-5 GM/200ML-% IV SOLN: 1000.0000 mg | INTRAVENOUS | Status: DC

## 2015-03-01 MED ORDER — SODIUM CHLORIDE 0.9 % IV BOLUS (SEPSIS): 1000.0000 mL | Freq: Once | INTRAVENOUS | Status: DC

## 2015-03-01 MED ORDER — SODIUM CHLORIDE 0.9 % IV BOLUS (SEPSIS)
1000.0000 mL | Freq: Once | INTRAVENOUS | Status: AC
  Administered 2015-03-01: 1000 mL via INTRAVENOUS

## 2015-03-01 MED ORDER — VANCOMYCIN HCL IN DEXTROSE 1-5 GM/200ML-% IV SOLN: 1000.0000 mg | Freq: Once | INTRAVENOUS | Status: DC

## 2015-03-01 MED ORDER — HYDROCODONE-ACETAMINOPHEN 5-325 MG PO TABS: 1.0000 | ORAL_TABLET | Freq: Once | ORAL | Status: DC

## 2015-03-01 MED ORDER — PIPERACILLIN-TAZOBACTAM 3.375 G IVPB
3.3750 g | Freq: Three times a day (TID) | INTRAVENOUS | Status: DC
  Administered 2015-03-02 – 2015-03-05 (×11): 3.375 g via INTRAVENOUS
  Filled 2015-03-01 (×10): qty 50

## 2015-03-01 MED ORDER — ACETAMINOPHEN 650 MG RE SUPP: 650.0000 mg | Freq: Four times a day (QID) | RECTAL | Status: DC | PRN

## 2015-03-01 MED ORDER — METHOCARBAMOL 500 MG PO TABS: 250.0000 mg | ORAL_TABLET | Freq: Three times a day (TID) | ORAL | Status: DC | PRN

## 2015-03-01 MED ORDER — SODIUM CHLORIDE 0.9 % IV SOLN
1250.0000 mg | INTRAVENOUS | Status: AC
  Administered 2015-03-01: 1250 mg via INTRAVENOUS
  Filled 2015-03-01: qty 1250

## 2015-03-01 MED ORDER — BACLOFEN 5 MG HALF TABLET
5.0000 mg | ORAL_TABLET | Freq: Three times a day (TID) | ORAL | Status: DC
  Administered 2015-03-01 – 2015-03-12 (×32): 5 mg via ORAL
  Filled 2015-03-01 (×35): qty 1

## 2015-03-01 MED ORDER — SODIUM CHLORIDE 0.9 % IJ SOLN
3.0000 mL | Freq: Two times a day (BID) | INTRAMUSCULAR | Status: DC
Start: 2015-03-01 — End: 2015-03-12
  Administered 2015-03-02 – 2015-03-12 (×13): 3 mL via INTRAVENOUS

## 2015-03-01 MED ORDER — MORPHINE SULFATE (PF) 2 MG/ML IV SOLN
2.0000 mg | Freq: Once | INTRAVENOUS | Status: AC
  Administered 2015-03-01: 2 mg via INTRAVENOUS
  Filled 2015-03-01: qty 1

## 2015-03-01 MED ORDER — PIPERACILLIN-TAZOBACTAM 3.375 G IVPB 30 MIN
3.3750 g | Freq: Once | INTRAVENOUS | Status: AC
  Administered 2015-03-01: 3.375 g via INTRAVENOUS
  Filled 2015-03-01: qty 50

## 2015-03-01 MED ORDER — SODIUM CHLORIDE 0.9 % IV BOLUS (SEPSIS)
500.0000 mL | Freq: Once | INTRAVENOUS | Status: AC
  Administered 2015-03-01: 500 mL via INTRAVENOUS

## 2015-03-01 MED ORDER — VANCOMYCIN HCL 10 G IV SOLR: 1250.0000 mg | INTRAVENOUS | Status: DC

## 2015-03-01 MED ORDER — ACETAMINOPHEN 325 MG PO TABS
650.0000 mg | ORAL_TABLET | Freq: Four times a day (QID) | ORAL | Status: DC | PRN
  Administered 2015-03-03 – 2015-03-12 (×20): 650 mg via ORAL
  Filled 2015-03-01 (×20): qty 2

## 2015-03-01 MED ORDER — SODIUM CHLORIDE 0.9 % IV SOLN
INTRAVENOUS | Status: DC
Start: 2015-03-01 — End: 2015-03-02
  Administered 2015-03-01: 22:00:00 via INTRAVENOUS

## 2015-03-01 MED ORDER — SODIUM CHLORIDE 0.9 % IV BOLUS (SEPSIS)
1000.0000 mL | INTRAVENOUS | Status: AC
  Administered 2015-03-01 (×3): 1000 mL via INTRAVENOUS

## 2015-03-01 NOTE — Progress Notes (Signed)
Utilization Review completed.  Zeki Bedrosian RN CM  

## 2015-03-01 NOTE — Progress Notes (Addendum)
ANTIBIOTIC CONSULT NOTE - INITIAL  Pharmacy Consult for Vancomycin and Zosyn Indication: rule out sepsis  Allergies  Allergen Reactions  . Sulfa Antibiotics Shortness Of Breath and Swelling    Patient Measurements:   Height: 63 inches Weight: estimated 80kg  Vital Signs: Temp: 97.7 F (36.5 C) (09/12 1712) Temp Source: Oral (09/12 1712) BP: 85/47 mmHg (09/12 1712) Pulse Rate: 120 (09/12 1712) Intake/Output from previous day:   Intake/Output from this shift: Total I/O In: 335 [Blood:335] Out: -   Labs:  Recent Labs  03/01/15 1326  WBC 28.7*  HGB 6.8*  PLT 646*  CREATININE 1.81*   CrCl cannot be calculated (Unknown ideal weight.). No results for input(s): VANCOTROUGH, VANCOPEAK, VANCORANDOM, GENTTROUGH, GENTPEAK, GENTRANDOM, TOBRATROUGH, TOBRAPEAK, TOBRARND, AMIKACINPEAK, AMIKACINTROU, AMIKACIN in the last 72 hours.   Microbiology: Recent Results (from the past 720 hour(s))  Urine culture     Status: None   Collection Time: 02/17/15  8:53 PM  Result Value Ref Range Status   Specimen Description URINE, CATHETERIZED  Final   Special Requests Normal  Final   Culture   Final    NO GROWTH 2 DAYS Performed at [facility]    Report Status 02/19/2015 FINAL  Final    Medical History: Past Medical History  Diagnosis Date  . MS (multiple sclerosis)   . HTN (hypertension)   . DM II (diabetes mellitus, type II), controlled   . Lymphedema   . Anemia   . Gait disorder     Medications:  Scheduled:   Infusions:  . piperacillin-tazobactam    . sodium chloride 1,000 mL (03/01/15 1721)  . vancomycin     PRN:   Assessment: 59 yo female with multiple sclerosis presents to the ED with lower back pain.  Assessment reveals hypothermia, hypotension, tachycardia, leukocytosis.  Pharmacy is consulted to dose vancomycin and zosyn for sepsis.  Possible sources are urinary or LE cellulitis with hx lower extremity lymphedema with intermittent weeping.  Labs reveal  leukocytosis and ARF.  Goal of Therapy:  Vancomycin trough level 15-20 mcg/ml  Zosyn dose per renal function  Plan:  Zosyn 3.375gm IV q8h (4hr extended infusions) Vancomycin 1250mg  IV q24h Check trough at steady state Follow up renal function & cultures, clinical course  Peggyann Juba, PharmD, BCPS Pager: [phone] 03/01/2015,5:25 PM   Addendum: Based on updated weight, adjust vancomycin maintenance dose to 1g IV q24h starting 9/13.  Peggyann Juba, PharmD, BCPS 03/01/2015 8:00 PM

## 2015-03-01 NOTE — ED Provider Notes (Signed)
CSN: [id number]     Arrival date & time 03/01/15  1238 History   First MD Initiated Contact with Patient 03/01/15 1309     Chief Complaint  Patient presents with  . Back Pain    nausea, 1 episode of emesis     (Consider location/radiation/quality/duration/timing/severity/associated sxs/prior Treatment) HPI  Belanna Manring is a 59 y.o F with a past medical history of multiple sclerosis, diabetes, severe chronic lower extremity edema, bedridden who presents to the emergency department today via EMS for back pain onset today. Patient lives at home with mother who called EMS after she reported to her that her back was hurting. Pain is 8 out of 10. Patient is immobile and does not get out of her chair. Her severe lower extremity edema prevents her from walking. Denies bowel or bladder incontinence although patient wears a brief because she cannot walk to the bathroom briefs are changed by home CNA. Denies saddle anesthesia. Denies fever, chills, chest pain, shortness of breath, numbness, change in baseline weakness, syncope, abdominal pain, dysuria, cough, vomiting, diarrhea.  Past Medical History  Diagnosis Date  . MS (multiple sclerosis)   . HTN (hypertension)   . DM II (diabetes mellitus, type II), controlled   . Lymphedema   . Anemia   . Gait disorder    History reviewed. No pertinent past surgical history. Family History  Problem Relation Age of Onset  . Multiple sclerosis Mother    Social History  Substance Use Topics  . Smoking status: Former Smoker    Types: Cigarettes  . Smokeless tobacco: Never Used  . Alcohol Use: No   OB History    Gravida Para Term Preterm AB TAB SAB Ectopic Multiple Living   3 3 0 0 0 0 0 0 0 0      Review of Systems  All other systems reviewed and are negative.     Allergies  Sulfa antibiotics  Home Medications   Prior to Admission medications   Medication Sig Start Date End Date Taking? Authorizing Provider  aspirin EC 81 MG EC tablet  Take 1 tablet (81 mg total) by mouth daily. 12/04/14  Yes Dixie Dials, MD  baclofen (LIORESAL) 10 MG tablet Take 0.5 tablets (5 mg total) by mouth 3 (three) times daily. 05/14/13  Yes Dennie Bible, NP  BENICAR HCT 20-12.5 MG per tablet Take 1 tablet by mouth daily. 01/01/15  Yes Historical Provider, MD  diphenhydramine-acetaminophen (TYLENOL PM) 25-500 MG TABS Take 1-2 tablets by mouth at bedtime as needed (sleep/pain).    Yes Historical Provider, MD  ferrous sulfate (FERROUSUL) 325 (65 FE) MG tablet Take 1 tablet (325 mg total) by mouth daily with breakfast. 12/04/14  Yes Dixie Dials, MD  glipiZIDE (GLUCOTROL) 5 MG tablet Take 0.5 tablets (2.5 mg total) by mouth daily before breakfast. Patient taking differently: Take 5 mg by mouth 2 (two) times daily before a meal.  12/04/14  Yes Dixie Dials, MD  HYDROcodone-acetaminophen (NORCO/VICODIN) 5-325 MG per tablet Take 2 tablets by mouth every 4 (four) hours as needed. Patient taking differently: Take 2 tablets by mouth every 4 (four) hours as needed for moderate pain.  10/24/14  Yes Noemi Chapel, MD  ibuprofen (ADVIL,MOTRIN) 400 MG tablet Take 1 tablet (400 mg total) by mouth 2 (two) times daily with a meal. Patient taking differently: Take 400 mg by mouth 2 (two) times daily as needed for headache or moderate pain.  12/04/14  Yes Dixie Dials, MD  methocarbamol (ROBAXIN) 500 MG tablet  Take 1 tablet (500 mg total) by mouth 2 (two) times daily as needed for muscle spasms. Patient taking differently: Take 250 mg by mouth every 8 (eight) hours as needed for muscle spasms.  01/07/15  Yes Delsa Grana, PA-C  potassium chloride (K-DUR) 10 MEQ tablet Take 1 tablet (10 mEq total) by mouth daily. Patient not taking: Reported on 02/17/2015 12/04/14   Dixie Dials, MD   BP 105/73 mmHg  Pulse 110  Temp(Src) 96.8 F (36 C) (Rectal)  Resp 16  SpO2 100% Physical Exam  Constitutional: She is oriented to person, place, and time. She appears well-developed and  well-nourished. No distress.  HENT:  Head: Normocephalic and atraumatic.  Mouth/Throat: Oropharynx is clear and moist. No oropharyngeal exudate.  Eyes: Conjunctivae and EOM are normal. Pupils are equal, round, and reactive to light. Right eye exhibits no discharge. Left eye exhibits no discharge. No scleral icterus.  Mild stribismus of right eye.  Neck: Normal range of motion. Neck supple.  Cardiovascular: Regular rhythm and normal heart sounds.  Exam reveals no gallop and no friction rub.   No murmur heard. Tachycardic to 111  Pulmonary/Chest: Effort normal and breath sounds normal. No respiratory distress. She has no wheezes. She has no rales. She exhibits no tenderness.  Abdominal: Soft. Bowel sounds are normal. She exhibits no distension and no mass. There is no tenderness. There is no rebound and no guarding.  Musculoskeletal: She exhibits edema. She exhibits no tenderness.  No midline spinal tenderness. TTP of lumbar paraspinal muscles.   Patient with severe lower extremity edema bilaterally. No tenderness. Unable to palpate a DP or PT pulses. However good cap refill.  Obvious atrophy of bilateral upper extremities. She unable to flex, extend, adduct, abduct bilateral fingers. Sequelae of multiple sclerosis.  Lymphadenopathy:    She has no cervical adenopathy.  Neurological: She is alert and oriented to person, place, and time.  Pt unable to lift legs due to severe LE edema. Decreased sensation in lower extremities.  Per sister patient has baseline mild cognitive dysfunction. Patient mentating well and able to answer all questions. Alert and oriented 4.   Skin: Skin is warm and dry. No rash noted. She is not diaphoretic. No erythema. No pallor.  Multiple Stasis ulcerations noted on bilateral lower extremities.  Psychiatric: She has a normal mood and affect. Her behavior is normal.  Nursing note and vitals reviewed.   ED Course  Procedures (including critical care time)  BP  71/51. 2L fluids given, BP improved 105/73  Hgb 6.8 will transfuse with 2 units RBC. Anemia Possible source of hypotension  UA neg for infection. CXR negative for infection. However with WBC 28, tachycardia and original temp of 96 will cover for sepsis. Unknown source.  Pt given vanc and zosyn for sepsis coverage  Spoke to hospitalist who will admit pt to their service  CRITICAL CARE Performed by: Carlos Levering   Total critical care time: 1.5 hr  Critical care time was exclusive of separately billable procedures and treating other patients.  Critical care was necessary to treat or prevent imminent or life-threatening deterioration.  Critical care was time spent personally by me on the following activities: development of treatment plan with patient and/or surrogate as well as nursing, discussions with consultants, evaluation of patient's response to treatment, examination of patient, obtaining history from patient or surrogate, ordering and performing treatments and interventions, ordering and review of laboratory studies, ordering and review of radiographic studies, pulse oximetry and re-evaluation of patient's  condition.  Labs Review Labs Reviewed  COMPREHENSIVE METABOLIC PANEL - Abnormal; Notable for the following:    Sodium 134 (*)    CO2 17 (*)    Glucose, Bld 194 (*)    BUN 94 (*)    Creatinine, Ser 1.81 (*)    Albumin 3.0 (*)    ALT 10 (*)    GFR calc non Af Amer 29 (*)    GFR calc Af Amer 34 (*)    All other components within normal limits  CBC WITH DIFFERENTIAL/PLATELET - Abnormal; Notable for the following:    WBC 28.7 (*)    RBC 2.53 (*)    Hemoglobin 6.8 (*)    HCT 20.5 (*)    Platelets 646 (*)    Neutrophils Relative % 95 (*)    Lymphocytes Relative 3 (*)    Monocytes Relative 2 (*)    Neutro Abs 27.2 (*)    All other components within normal limits  CBG MONITORING, ED - Abnormal; Notable for the following:    Glucose-Capillary 188 (*)    All  other components within normal limits  CULTURE, BLOOD (ROUTINE X 2)  CULTURE, BLOOD (ROUTINE X 2)  URINE CULTURE  BRAIN NATRIURETIC PEPTIDE  LACTIC ACID, PLASMA  LACTIC ACID, PLASMA  LACTIC ACID, PLASMA  LACTIC ACID, PLASMA  PROCALCITONIN  PROTIME-INR  APTT  URINALYSIS, ROUTINE W REFLEX MICROSCOPIC (NOT AT [facility])  CORTISOL  VITAMIN B12  FOLATE  IRON AND TIBC  FERRITIN  RETICULOCYTES  I-STAT TROPOININ, ED  TYPE AND SCREEN  PREPARE RBC (CROSSMATCH)  ABO/RH    Imaging Review Dg Chest Portable 1 View  03/01/2015   CLINICAL DATA:  Per the patient the mid back pain began a week ago, and briefly became better before becoming worse again today. The patient reports experiencing nausea and 1 episode of emesis this AM.  EXAM: PORTABLE CHEST - 1 VIEW  COMPARISON:  11/28/2014  FINDINGS: Cardiac silhouette normal in size and configuration. Aorta is mildly uncoiled. No mediastinal or hilar masses or evidence of adenopathy. Clear lungs. No pleural effusion or pneumothorax.  Bony thorax is grossly intact.  IMPRESSION: No acute cardiopulmonary disease.   Electronically Signed   By: Lajean Manes M.D.   On: 03/01/2015 14:49   I have personally reviewed and evaluated these images and lab results as part of my medical decision-making.   EKG Interpretation   Date/Time:  Monday March 01 2015 13:31:45 EDT Ventricular Rate:  104 PR Interval:  139 QRS Duration: 81 QT Interval:  349 QTC Calculation: 459 R Axis:   -16 Text Interpretation:  Sinus tachycardia Borderline left axis deviation Low  voltage, precordial leads Abnormal R-wave progression, early transition  Nonspecific T abnormalities, lateral leads Confirmed by Monifa Blanchette  MD, Amandeep Nesmith  [phone]) on 03/01/2015 2:15:10 PM      MDM   Final diagnoses:  Anemia, unspecified anemia type  Multiple sclerosis    See ED course. Pt presented via EMS called by mother for back pain. Pt a/o x 4. Does not appear toxic. Sister present at bedside stating  that patient appears to be at her baseline with mild cognitive dysfunction. Pt able to answer all questions. However, BP 86/48 with fluids. Original rectal temp 96.8. HGB 6.8. Transfused 2 units blood. WBC 28.Tachycardic to 111. No clear source of infection found in labs or xray. Spoke to [facility] hospitalist who will admit. Will cover with vanc and zosyn and continue administering fluids. Temp now 97.7.   Patient was  discussed with and seen by Dr. Lacinda Axon who agrees with the treatment plan.      Dondra Spry [place], PA-C 03/01/15 1808   CRITICAL CARE Performed by: Nat Christen Total critical care time: 30 Critical care time was exclusive of separately billable procedures and treating other patients. Critical care was necessary to treat or prevent imminent or life-threatening deterioration. Critical care was time spent personally by me on the following activities: development of treatment plan with patient and/or surrogate as well as nursing, discussions with consultants, evaluation of patient's response to treatment, examination of patient, obtaining history from patient or surrogate, ordering and performing treatments and interventions, ordering and review of laboratory studies, ordering and review of radiographic studies, pulse oximetry and re-evaluation of patient's condition.    Medical screening examination/treatment/procedure(s) were conducted as a shared visit with non-physician practitioner(s) and myself.  I personally evaluated the patient during the encounter.   EKG Interpretation   Date/Time:  Monday March 01 2015 13:31:45 EDT Ventricular Rate:  104 PR Interval:  139 QRS Duration: 81 QT Interval:  349 QTC Calculation: 459 R Axis:   -16 Text Interpretation:  Sinus tachycardia Borderline left axis deviation Low  voltage, precordial leads Abnormal R-wave progression, early transition  Nonspecific T abnormalities, lateral leads Confirmed by Emmeline Winebarger  MD, Ludell Zacarias  [phone]) on  03/01/2015 2:15:10 PM     Complex patient who presented with low back pain, hypotension and tachycardia. Sister reports normal behavior with mild cognitive dysfunction. No obvious fever, sweats, chills, dysuria, cough, chest pain, dyspnea. Chest x-ray showed no obvious signs of infection, but UA abnormal.  WBC's elevated.  Initial vital signs were attributed to a significant drop in hemoglobin. She appeared nontoxic in the emergency department. Blood and IV fluids were administered. Additionally IV vancomycin and Zosyn were eventually ordered after workup appeared to be more sepsis related.  Patient admitted to general medicine.  Nat Christen, MD 03/04/15 1226

## 2015-03-01 NOTE — H&P (Signed)
History and Physical  SHARIE AMORIN XNA:[id number] DOB: 1955/08/18 DOA: 03/01/2015  Referring physician: Carlos Levering, ED PA-C/Dr. Nat Christen, EDP PCP: [name], MD  Outpatient Specialists:  1. None. Used to see Dr. Doy Mince from neurology before he moved out of town.  Chief Complaint: Low back pain.  HPI: Jocelyn Sanchez is a 59 y.o. female , single, lives with mother, wheelchair mobile, PMH of multiple sclerosis, DM 2, HTN, chronic lower extremity lymphedema, chronic anemia, presented to the [facility] ED on 03/01/15 with complaints of low back pain. Patient able to provide significant history. No family at bedside. She states that she was in her usual state of health until sometime this morning when she started experiencing gradual onset of worsening low back pain without radiation, no aggravating or relieving factors, not associated with dysuria, foul-smelling urine or urinary frequency, denies fever or chills. She had one episode of nonbloody emesis. No abdominal pain reported. She denies headache, earache, sore throat, chest pain, cough or dyspnea. She states that her legs are unchanged and has intermittent weeping from chronic skin changes. She denies trauma or fall. She states that she has good urine output. In the ED, she was hypothermic 96.48F, tachycardic in the 120s, hypotensive 71/51, lab work significant for glucose 194, creatinine 1.81, WBC 28.7, hemoglobin 6.8, platelets 646, BNP 26.5, lactate 1.7 and chest x-ray without acute cardiopulmonary disease. She was given 1 L of IV fluids and plans to transfuse 2 units of PRBC. As per ED RN, patient had a soft brown BM and no melena or blood in stools. Patient states that her back pain is better. No other specific measures taken thus far to evaluate or address her hemodynamic instability or clinical picture. Hospitalist admission was requested. Patient states that her back pain is significantly better.   Review  of Systems: All systems reviewed and apart from history of presenting illness, are negative.  Past Medical History  Diagnosis Date  . MS (multiple sclerosis)   . HTN (hypertension)   . DM II (diabetes mellitus, type II), controlled   . Lymphedema   . Anemia   . Gait disorder    History reviewed. No pertinent past surgical history. Social History:  reports that she has quit smoking. Her smoking use included Cigarettes. She has never used smokeless tobacco. She reports that she does not drink alcohol or use illicit drugs. Rest per history of presenting illness.  Allergies  Allergen Reactions  . Sulfa Antibiotics Shortness Of Breath and Swelling    Family History  Problem Relation Age of Onset  . Multiple sclerosis Mother     Prior to Admission medications   Medication Sig Start Date End Date Taking? Authorizing Provider  aspirin EC 81 MG EC tablet Take 1 tablet (81 mg total) by mouth daily. 12/04/14  Yes Dixie Dials, MD  baclofen (LIORESAL) 10 MG tablet Take 0.5 tablets (5 mg total) by mouth 3 (three) times daily. 05/14/13  Yes Dennie Bible, NP  BENICAR HCT 20-12.5 MG per tablet Take 1 tablet by mouth daily. 01/01/15  Yes Historical Provider, MD  diphenhydramine-acetaminophen (TYLENOL PM) 25-500 MG TABS Take 1-2 tablets by mouth at bedtime as needed (sleep/pain).    Yes Historical Provider, MD  ferrous sulfate (FERROUSUL) 325 (65 FE) MG tablet Take 1 tablet (325 mg total) by mouth daily with breakfast. 12/04/14  Yes Dixie Dials, MD  glipiZIDE (GLUCOTROL) 5 MG tablet Take 0.5 tablets (2.5 mg total) by mouth daily  before breakfast. Patient taking differently: Take 5 mg by mouth 2 (two) times daily before a meal.  12/04/14  Yes Dixie Dials, MD  HYDROcodone-acetaminophen (NORCO/VICODIN) 5-325 MG per tablet Take 2 tablets by mouth every 4 (four) hours as needed. Patient taking differently: Take 2 tablets by mouth every 4 (four) hours as needed for moderate pain.  10/24/14  Yes Noemi Chapel, MD  ibuprofen (ADVIL,MOTRIN) 400 MG tablet Take 1 tablet (400 mg total) by mouth 2 (two) times daily with a meal. Patient taking differently: Take 400 mg by mouth 2 (two) times daily as needed for headache or moderate pain.  12/04/14  Yes Dixie Dials, MD  methocarbamol (ROBAXIN) 500 MG tablet Take 1 tablet (500 mg total) by mouth 2 (two) times daily as needed for muscle spasms. Patient taking differently: Take 250 mg by mouth every 8 (eight) hours as needed for muscle spasms.  01/07/15  Yes Delsa Grana, PA-C   Physical Exam: Filed Vitals:   03/01/15 1530 03/01/15 1550 03/01/15 1621 03/01/15 1652  BP: 95/58 102/64 97/52 92/51   Pulse: 118 116 119 124  Temp:      TempSrc:      Resp: 13 14 12 18   SpO2: 100% 100% 100% 100%   Patient was examined in detail along with her ED female RN.  General exam: Moderately built and nourished pleasant middle-aged female patient, lying comfortably supine on the gurney in no obvious distress.  Head, eyes and ENT: Nontraumatic and normocephalic. Pupils equally reacting to light and accommodation. Oral mucosa dry.  Neck: Supple. No JVD, carotid bruit or thyromegaly.  Lymphatics: No lymphadenopathy.  Respiratory system: Clear to auscultation. No increased work of breathing.  Cardiovascular system: S1 and S2 heard, RRR. No JVD, murmurs, gallops, clicks.  Gastrointestinal system: Abdomen is nondistended, soft and nontender. Normal bowel sounds heard. No organomegaly or masses appreciated.  Central nervous system: Alert and oriented. No cranial nerve deficits.  Extremities: Peripheral pulses symmetrically felt in upper extremities and difficult to palpate in lower extremities secondary to chronic edema. Chronic bilateral lower extremity skin changes-thickening, lichenification, hyperpigmentation related to chronic lymphedema. Mild redness on the posterior aspect of the right lower leg and ankle. No big ulcers or other features suggestive of infection.  Contractures of bilateral upper extremity and able to move them some. Decreased movements of bilateral lower extremity-grade 2 x 5 power.  Skin: Approximately 0.5 cm size mid sacral clean stage II decubitus ulcer with chronic skin changes around from pressure. Minimal yellow stool staining sheets.  Musculoskeletal system: Negative exam. No acute findings to support cause of low back pain.  Psychiatry: Pleasant and cooperative.   Labs on Admission:  Basic Metabolic Panel:  Recent Labs Lab 03/01/15 1326  NA 134*  K 4.3  CL 103  CO2 17*  GLUCOSE 194*  BUN 94*  CREATININE 1.81*  CALCIUM 9.3   Liver Function Tests:  Recent Labs Lab 03/01/15 1326  AST 15  ALT 10*  ALKPHOS 68  BILITOT 0.6  PROT 7.8  ALBUMIN 3.0*   No results for input(s): LIPASE, AMYLASE in the last 168 hours. No results for input(s): AMMONIA in the last 168 hours. CBC:  Recent Labs Lab 03/01/15 1326  WBC 28.7*  NEUTROABS 27.2*  HGB 6.8*  HCT 20.5*  MCV 81.0  PLT 646*   Cardiac Enzymes: No results for input(s): CKTOTAL, CKMB, CKMBINDEX, TROPONINI in the last 168 hours.  BNP (last 3 results) No results for input(s): PROBNP in the last 8760 hours.  CBG:  Recent Labs Lab 03/01/15 1322  GLUCAP 188*    Radiological Exams on Admission: Dg Chest Portable 1 View  03/01/2015   CLINICAL DATA:  Per the patient the mid back pain began a week ago, and briefly became better before becoming worse again today. The patient reports experiencing nausea and 1 episode of emesis this AM.  EXAM: PORTABLE CHEST - 1 VIEW  COMPARISON:  11/28/2014  FINDINGS: Cardiac silhouette normal in size and configuration. Aorta is mildly uncoiled. No mediastinal or hilar masses or evidence of adenopathy. Clear lungs. No pleural effusion or pneumothorax.  Bony thorax is grossly intact.  IMPRESSION: No acute cardiopulmonary disease.   Electronically Signed   By: Lajean Manes M.D.   On: 03/01/2015 14:49    EKG: Independently  reviewed. Sinus rhythm, left axis deviation and no acute changes. QTC 459 ms.  Assessment/Plan Principal Problem:   Sepsis-hypothermia, hypotension, tachycardia, leukocytosis Active Problems:   Multiple sclerosis   Diabetes mellitus   Hypertension   Lymphedema-with possible cellulitis of right leg   Decubitus ulcers   Dehydration   Acute kidney injury   Sepsis - Present on admission (hypothermia, tachycardia, hypotension and leukocytosis) - Etiology: May be related to right leg cellulitis complicating chronic lymphedema versus rule out urinary tract infection - Initiate sepsis protocol including cultures, aggressive IV fluid hydration and broad-spectrum IV antibiotics (IV Zosyn & vancomycin) - Place Foley catheter to monitor strict intake and output, avoid soiling of decubitus ulcer with urine and also to collect sample for culture. - Trend lactate, pro-calcitonin. Check serum cortisol.  Dehydration - IV fluids  Acute kidney injury/non-anion gap metabolic acidosis - Secondary to dehydration, sepsis, ARB and? NSAIDs - Hold culprit medications. IV fluids and follow BMP daily. - Insert Foley catheter temporarily for reasons indicated above.  Anemia - Baseline hemoglobin may be in the 9-10 range. Admitting hemoglobin 6.8. No report of overt GI bleeding. - Unclear as to reason for her significant drop. No melena in ED. - Chek anemia panel prior to transfusion - Agree with transfusing 2 units of PRBCs and follow CBC in a.m.  Type II DM - Hold oral hypoglycemics. Place on NovoLog SSI.  Essential hypertension - Hypotensive in ED. Hold antihypertensives. Management as above  Chronic lymphedema of legs - Possible cellulitis of right leg. Management as above  Multiple sclerosis - With quadriparesis. Not on treatment and does not follow with neurology. Wheelchair mobile at home.  Stage II sacral decubitus ulcer - Wound care consultation.  Leukocytosis - unclear etiology. No  reported diarrhea to suspect C. difficile. May be related to sepsis from above. Treat as above and monitor daily CBCs.  Acute low back pain - Unclear etiology. No acute findings on local exam.? Arthritic. Will check x-ray of lumbar spine. Monitor. If persists or worsens, may consider further evaluation. Could also be related to UTI/pyelonephritis if confirmed. Supportive treatment for now.   DVT prophylaxis: SCDs Code Status: Full  Family Communication: None at bedside  Disposition Plan: Admit to stepdown. DC home when medically stable.   Time spent: 106 minutes  Shamina Etheridge, MD, FACP, FHM. [facility] Pager [phone]  If 7PM-7AM, please contact night-coverage [email or web address] Password [facility] 03/01/2015, 5:08 PM

## 2015-03-01 NOTE — Progress Notes (Signed)
CRITICAL VALUE ALERT  Critical value received: Lactic acid 2.4  Date of notification:  03/01/15  Time of notification:  2220  Critical value read back:Yes.    Nurse who received alert:  Garner Gavel  MD notified (1st page):  Baltazar Najjar  Time of first page:  2226 MD notified (2nd page):  Time of second page:  Responding MD:  Baltazar Najjar  Time MD responded:  2236

## 2015-03-01 NOTE — ED Notes (Signed)
Pt transported to Xray. RN accompanies

## 2015-03-01 NOTE — ED Notes (Signed)
Per the patient the mid back pain began a week ago, and briefly became better before becoming worse again today. The patient reports experiencing nausea and 1 episode of emesis this AM.

## 2015-03-01 NOTE — ED Notes (Signed)
Letitia Libra- sister Phone- [phone]

## 2015-03-02 DIAGNOSIS — I890 Lymphedema, not elsewhere classified: Secondary | ICD-10-CM

## 2015-03-02 DIAGNOSIS — L8990 Pressure ulcer of unspecified site, unspecified stage: Secondary | ICD-10-CM

## 2015-03-02 DIAGNOSIS — N3000 Acute cystitis without hematuria: Secondary | ICD-10-CM

## 2015-03-02 DIAGNOSIS — E119 Type 2 diabetes mellitus without complications: Secondary | ICD-10-CM

## 2015-03-02 DIAGNOSIS — N179 Acute kidney failure, unspecified: Secondary | ICD-10-CM

## 2015-03-02 DIAGNOSIS — D649 Anemia, unspecified: Secondary | ICD-10-CM

## 2015-03-02 DIAGNOSIS — A419 Sepsis, unspecified organism: Secondary | ICD-10-CM

## 2015-03-02 LAB — COMPREHENSIVE METABOLIC PANEL
ALBUMIN: 2.5 g/dL — AB (ref 3.5–5.0)
ALT: 9 U/L — ABNORMAL LOW (ref 14–54)
ANION GAP: 11 (ref 5–15)
AST: 15 U/L (ref 15–41)
Alkaline Phosphatase: 58 U/L (ref 38–126)
BILIRUBIN TOTAL: 0.9 mg/dL (ref 0.3–1.2)
BUN: 54 mg/dL — AB (ref 6–20)
CHLORIDE: 115 mmol/L — AB (ref 101–111)
CO2: 15 mmol/L — ABNORMAL LOW (ref 22–32)
Calcium: 8.2 mg/dL — ABNORMAL LOW (ref 8.9–10.3)
Creatinine, Ser: 0.91 mg/dL (ref 0.44–1.00)
GFR calc Af Amer: >60 mL/min (ref >60)
GLUCOSE: 92 mg/dL (ref 65–99)
POTASSIUM: 3.3 mmol/L — AB (ref 3.5–5.1)
Sodium: 141 mmol/L (ref 135–145)
TOTAL PROTEIN: 6.3 g/dL — AB (ref 6.5–8.1)

## 2015-03-02 LAB — FOLATE: Folate: 6.8 ng/mL (ref >5.9)

## 2015-03-02 LAB — GLUCOSE, CAPILLARY
GLUCOSE-CAPILLARY: 80 mg/dL (ref 65–99)
GLUCOSE-CAPILLARY: 90 mg/dL (ref 65–99)
GLUCOSE-CAPILLARY: 227 mg/dL — AB (ref 65–99)
Glucose-Capillary: 141 mg/dL — ABNORMAL HIGH (ref 65–99)
Glucose-Capillary: 154 mg/dL — ABNORMAL HIGH (ref 65–99)
Glucose-Capillary: 241 mg/dL — ABNORMAL HIGH (ref 65–99)

## 2015-03-02 LAB — CORTISOL: Cortisol, Plasma: 25.4 ug/dL

## 2015-03-02 LAB — IRON AND TIBC
Iron: 29 ug/dL (ref 28–170)
SATURATION RATIOS: 16 % (ref 10.4–31.8)
TIBC: 179 ug/dL — ABNORMAL LOW (ref 250–450)
UIBC: 150 ug/dL

## 2015-03-02 LAB — VITAMIN B12: VITAMIN B 12: 722 pg/mL (ref 180–914)

## 2015-03-02 LAB — LACTIC ACID, PLASMA: Lactic Acid, Venous: 2.1 mmol/L (ref 0.5–2.0)

## 2015-03-02 LAB — FERRITIN: FERRITIN: 294 ng/mL (ref 11–307)

## 2015-03-02 MED ORDER — MIDODRINE HCL 5 MG PO TABS
5.0000 mg | ORAL_TABLET | Freq: Three times a day (TID) | ORAL | Status: DC
  Administered 2015-03-02 – 2015-03-04 (×6): 5 mg via ORAL
  Filled 2015-03-02 (×9): qty 1

## 2015-03-02 MED ORDER — LIDOCAINE HCL 2 % IJ SOLN
INTRAMUSCULAR | Status: AC
  Filled 2015-03-02: qty 20

## 2015-03-02 MED ORDER — VANCOMYCIN HCL IN DEXTROSE 1-5 GM/200ML-% IV SOLN
1000.0000 mg | Freq: Two times a day (BID) | INTRAVENOUS | Status: DC
  Administered 2015-03-02 – 2015-03-05 (×6): 1000 mg via INTRAVENOUS
  Filled 2015-03-02 (×5): qty 200

## 2015-03-02 MED ORDER — SODIUM CHLORIDE 0.9 % IV BOLUS (SEPSIS)
500.0000 mL | Freq: Once | INTRAVENOUS | Status: AC
  Administered 2015-03-02: 500 mL via INTRAVENOUS

## 2015-03-02 MED ORDER — HYDROCORTISONE NA SUCCINATE PF 100 MG IJ SOLR
50.0000 mg | Freq: Four times a day (QID) | INTRAMUSCULAR | Status: DC
  Administered 2015-03-02: 50 mg via INTRAVENOUS
  Filled 2015-03-02: qty 2

## 2015-03-02 MED ORDER — STERILE WATER FOR INJECTION IV SOLN
INTRAVENOUS | Status: DC
  Administered 2015-03-02 – 2015-03-03 (×3): via INTRAVENOUS
  Filled 2015-03-02 (×5): qty 850

## 2015-03-02 MED ORDER — PHENYLEPHRINE HCL 10 MG/ML IJ SOLN
0.0000 ug/min | INTRAVENOUS | Status: DC
  Filled 2015-03-02: qty 1

## 2015-03-02 MED ORDER — LIDOCAINE HCL (PF) 1 % IJ SOLN
INTRAMUSCULAR | Status: AC
  Administered 2015-03-02: 5 mL
  Filled 2015-03-02: qty 5

## 2015-03-02 MED ORDER — SODIUM CHLORIDE 0.9 % IV SOLN
INTRAVENOUS | Status: DC
  Administered 2015-03-02: 06:00:00 via INTRAVENOUS

## 2015-03-02 MED ORDER — POTASSIUM CHLORIDE CRYS ER 20 MEQ PO TBCR
40.0000 meq | EXTENDED_RELEASE_TABLET | Freq: Once | ORAL | Status: AC
  Administered 2015-03-02: 40 meq via ORAL
  Filled 2015-03-02: qty 2

## 2015-03-02 MED ORDER — SODIUM CHLORIDE 0.9 % IV SOLN: INTRAVENOUS | Status: DC | PRN

## 2015-03-02 NOTE — Progress Notes (Signed)
ANTIBIOTIC CONSULT NOTE - Follow Up  Pharmacy Consult for Vancomycin and Zosyn Indication: rule out sepsis  Allergies  Allergen Reactions  . Sulfa Antibiotics Shortness Of Breath and Swelling    Patient Measurements: Height: 5\' 3"  (160 cm) Weight: 165 lb (74.844 kg) IBW/kg (Calculated) : 52.4 Height: 63 inches Weight: estimated 80kg  Vital Signs: Temp: 100.4 F (38 C) (09/13 0800) Temp Source: Oral (09/13 0800) BP: 100/60 mmHg (09/13 0900) Pulse Rate: 118 (09/13 1130) Intake/Output from previous day: 09/12 0701 - 09/13 0700 In: 2514.3 [I.V.:799.3; XTGGY:694; IV Piggyback:50] Out: 1950 [Urine:1950] Intake/Output from this shift:    Labs:  Recent Labs  03/01/15 1326 03/02/15 0522  WBC 28.7* 27.6*  HGB 6.8* 11.4*  PLT 646* 386  CREATININE 1.81* 0.91   Estimated Creatinine Clearance: 63.7 mL/min (by C-G formula based on Cr of 0.91). No results for input(s): VANCOTROUGH, VANCOPEAK, VANCORANDOM, GENTTROUGH, GENTPEAK, GENTRANDOM, TOBRATROUGH, TOBRAPEAK, TOBRARND, AMIKACINPEAK, AMIKACINTROU, AMIKACIN in the last 72 hours.   Microbiology: Recent Results (from the past 720 hour(s))  Urine culture     Status: None   Collection Time: 02/17/15  8:53 PM  Result Value Ref Range Status   Specimen Description URINE, CATHETERIZED  Final   Special Requests Normal  Final   Culture   Final    NO GROWTH 2 DAYS Performed at [facility]    Report Status 02/19/2015 FINAL  Final  Culture, blood (x 2)     Status: None (Preliminary result)   Collection Time: 03/01/15  1:51 PM  Result Value Ref Range Status   Specimen Description BLOOD RIGHT ARM  Final   Special Requests BOTTLES DRAWN AEROBIC AND ANAEROBIC 10CC  Final   Culture  Setup Time   Final    GRAM NEGATIVE RODS IN BOTH AEROBIC AND ANAEROBIC BOTTLES CRITICAL RESULT CALLED TO, READ BACK BY AND VERIFIED WITH: GRAM POSITIVE RODS C CREECH@0650  03/02/15 Performed at [facility]    Culture PENDING  Incomplete    Report Status PENDING  Incomplete  MRSA PCR Screening     Status: Abnormal   Collection Time: 03/01/15  9:34 PM  Result Value Ref Range Status   MRSA by PCR POSITIVE (A) NEGATIVE Final    Comment:        The GeneXpert MRSA Assay (FDA approved for NASAL specimens only), is one component of a comprehensive MRSA colonization surveillance program. It is not intended to diagnose MRSA infection nor to guide or monitor treatment for MRSA infections. RESULT CALLED TO, READ BACK BY AND VERIFIED WITH: M.REEVES,RN AT 2334 ON 03/02/15 BY W.SHEA     Medical History: Past Medical History  Diagnosis Date  . MS (multiple sclerosis)   . HTN (hypertension)   . DM II (diabetes mellitus, type II), controlled   . Lymphedema   . Anemia   . Gait disorder     Medications:  Scheduled:  . baclofen  5 mg Oral TID  . insulin aspart  0-5 Units Subcutaneous QHS  . insulin aspart  0-9 Units Subcutaneous TID WC  . midodrine  5 mg Oral TID WC  . pantoprazole (PROTONIX) IV  40 mg Intravenous Q24H  . piperacillin-tazobactam (ZOSYN)  IV  3.375 g Intravenous Q8H  . sodium chloride  3 mL Intravenous Q12H  . vancomycin  1,000 mg Intravenous Q12H   Infusions:  .  sodium bicarbonate 150 mEq in sterile water 1000 mL infusion 100 mL/hr at 03/02/15 1002   PRN:   Assessment: 59 yo female  with multiple sclerosis presents to the ED with lower back pain.  Assessment reveals hypothermia, hypotension, tachycardia, leukocytosis.  Pharmacy is consulted to dose vancomycin and zosyn for sepsis.  Possible sources are urinary or LE cellulitis with hx lower extremity lymphedema with intermittent weeping.  9/12 >> Vanc >> 9/12 >> Zosyn >>  9/12 >> blood x 2: GNR x 1 so far 9/12 >> urine >> sent  Today, 03/02/2015: Tmax: 100.4 WBC remains elevated, PCT 1.16, LA 2.1 Renal: SCr improved, CrCl~77 ml/min (CG) ~74 ml/min (normalized)  Goal of Therapy:  Vancomycin trough level 15-20 mcg/ml  Zosyn dose per renal  function  Plan:  Continue Zosyn 3.375gm IV q8h (4hr extended infusions) Vanc 1g IV q12h. Will continue given MRSA swab positive per CCM.  Agree to cont broad spec today while BCx pending and septic.  F/u for improvement and narrowing.  Hershal Coria, PharmD, BCPS Pager: [phone] 03/02/2015 11:48 AM

## 2015-03-02 NOTE — Progress Notes (Signed)
PROGRESS NOTE    Jocelyn Sanchez YJE:[id number] DOB: 05/09/1956 DOA: 03/01/2015 PCP: [name], MD  HPI/Brief narrative 59 y.o. female , single, lives with mother, wheelchair mobile, PMH of multiple sclerosis, DM 2, HTN, chronic lower extremity lymphedema, chronic anemia, presented to the [facility] ED on 03/01/15 with complaints of low back pain. She had one episode of nonbloody emesis prior to admission otherwise gave no specific symptoms to suggest source of infection. In the ED, she was hypothermic 96.97F, tachycardic in the 120s, hypotensive 71/51, lab work significant for glucose 194, creatinine 1.81, WBC 28.7, hemoglobin 6.8, platelets 646, BNP 26.5, lactate 1.7 and chest x-ray without acute cardiopulmonary disease. She was admitted to the stepdown unit with diagnosis of sepsis, acute kidney injury and subacute/acute over chronic anemia. As per sepsis protocol, treated aggressively with IV fluids (~8 L since admission), broad-spectrum IV antibiotics and 2 units of PRBCs. Mentating well, good urine output but remained hypotensive-not sure if blood pressures aren't accurate given upper extremity contractures. Unable to place a line. CCM consulted for assistance on 9/13.  Assessment/Plan:  Severe sepsis with septic shock secondary to GNR bacteremia from UTI - Present on admission (hypothermia, tachycardia, hypotension and leukocytosis) - Etiology: May be related to right leg cellulitis complicating chronic lymphedema versus rule out urinary tract infection - Initiated sepsis protocol including cultures, aggressive IV fluid hydration and broad-spectrum IV antibiotics (IV Zosyn & vancomycin) - Placed Foley catheter to monitor strict intake and output, avoid soiling of decubitus ulcer with urine and also to collect sample for culture. - Trend lactate, pro-calcitonin. Check serum cortisol-Pending. - As per nursing, has received approximately 8 L of IV fluids since admission and 2  units of PRBCs and continued to remain hypotensive in the 80s-90s (unsure if these are accurate blood pressures due to upper extremity contractures and unable to place arterial line). Patient mentating well and good urine output.  - Started stress dose IV hydrocortisone pending cortisol results. - CCM consulted 9/13.   Dehydration - resolved after IV fluids.   Acute kidney injury/non-anion gap metabolic acidosis - Secondary to dehydration, sepsis, ARB and? NSAIDs - Hold culprit medications. IV fluids and follow BMP daily. - Inserted Foley catheter temporarily for reasons indicated above. - Acute kidney injury resolved.  - Bicarbonate has dropped from 17 > 15. Start bicarbonate drip.   Anemia - Baseline hemoglobin may be in the 9-10 range. Admitting hemoglobin 6.8. No report of overt GI bleeding. - Unclear as to reason for her significant drop. No melena in ED. - Chek anemia panel prior to transfusion - s/p 2 units of PRBCs 9/12. Hemoglobin has improved to 11.4. Follow CBC in a.m.   Type II DM - Hold oral hypoglycemics. Place on NovoLog SSI. stable.   Essential hypertension - Hypotensive in ED. Hold antihypertensives. Management as above. Remains hypotensive.    Chronic lymphedema of legs - Possible cellulitis of right leg. Management as above. Continue IV vancomycin.   Multiple sclerosis - With quadriparesis. Not on treatment and does not follow with neurology. Wheelchair mobile at home.  Stage II sacral decubitus ulcer - Wound care consultation.  Leukocytosis - unclear etiology. No reported diarrhea to suspect C. difficile. May be related to sepsis from above. Treat as above and monitor daily CBCs.  Acute low back pain - Unclear etiology. No acute findings on local exam.? Arthritic. Monitor. If persists or worsens, may consider further evaluation. Could also be related to UTI/pyelonephritis if confirmed. Supportive treatment for now.  lumbar spine x-ray without acute findings.  Controlled.   Hypokalemia - Replace and follow   DVT prophylaxis: SCDs Code Status: Full  Family Communication: None at bedside  Disposition Plan:  continue management in stepdown unit.   Consultants:  CCM consulted 9/13.   Procedures:  Foley catheter 9/12 >.   Antibiotics:  IV Zosyn 9/12 >   IV vancomycin 9/12 >  Subjective: Denies complaints. Frustrated regarding multiple blood draws.  Objective: Filed Vitals:   03/02/15 0632 03/02/15 0718 03/02/15 0730 03/02/15 0800  BP: 69/35 79/52 81/44  90/54  Pulse: 113 111 110 118  Temp:    100.4 F (38 C)  TempSrc:    Oral  Resp: 12 14 14 16   Height:      Weight:      SpO2: 99% 98% 98% 98%    Intake/Output Summary (Last 24 hours) at 03/02/15 0900 Last data filed at 03/02/15 0700  Gross per 24 hour  Intake 2514.25 ml  Output   1950 ml  Net 564.25 ml   Filed Weights   03/01/15 1948  Weight: 74.844 kg (165 lb)     Exam:  General exam: pleasant middle-aged female lying comfortably in bed  Respiratory system: Clear. No increased work of breathing. Cardiovascular system: S1 & S2 heard, RRR. No JVD, murmurs, gallops, clicks or pedal edema. Telemetry: Sinus tachycardia in the 100s-110s.  Gastrointestinal system: Abdomen is nondistended, soft and nontender. Normal bowel sounds heard. Foley catheter +.  Central nervous system: Alert and oriented. No focal neurological deficits. Extremities: Peripheral pulses symmetrically felt in upper extremities and difficult to palpate in lower extremities secondary to chronic edema. Chronic bilateral lower extremity skin changes-thickening, lichenification, hyperpigmentation related to chronic lymphedema. Mild redness on the posterior aspect of the right lower leg and ankle. No big ulcers or other features suggestive of infection. Contractures of bilateral upper extremity and able to move them some. Decreased movements of bilateral lower extremity-grade 2 x 5 power.   Data  Reviewed: Basic Metabolic Panel:  Recent Labs Lab 03/01/15 1326 03/02/15 0522  NA 134* 141  K 4.3 3.3*  CL 103 115*  CO2 17* 15*  GLUCOSE 194* 92  BUN 94* 54*  CREATININE 1.81* 0.91  CALCIUM 9.3 8.2*   Liver Function Tests:  Recent Labs Lab 03/01/15 1326 03/02/15 0522  AST 15 15  ALT 10* 9*  ALKPHOS 68 58  BILITOT 0.6 0.9  PROT 7.8 6.3*  ALBUMIN 3.0* 2.5*   No results for input(s): LIPASE, AMYLASE in the last 168 hours. No results for input(s): AMMONIA in the last 168 hours. CBC:  Recent Labs Lab 03/01/15 1326 03/02/15 0522  WBC 28.7* 27.6*  NEUTROABS 27.2*  --   HGB 6.8* 11.4*  HCT 20.5* 34.3*  MCV 81.0 83.7  PLT 646* 386   Cardiac Enzymes: No results for input(s): CKTOTAL, CKMB, CKMBINDEX, TROPONINI in the last 168 hours. BNP (last 3 results) No results for input(s): PROBNP in the last 8760 hours. CBG:  Recent Labs Lab 03/01/15 1322 03/01/15 2141 03/02/15 0457 03/02/15 0814  GLUCAP 188* 114* 80 90    Recent Results (from the past 240 hour(s))  Culture, blood (x 2)     Status: None (Preliminary result)   Collection Time: 03/01/15  1:51 PM  Result Value Ref Range Status   Specimen Description BLOOD RIGHT ARM  Final   Special Requests BOTTLES DRAWN AEROBIC AND ANAEROBIC 10CC  Final   Culture  Setup Time   Final  GRAM NEGATIVE RODS IN BOTH AEROBIC AND ANAEROBIC BOTTLES CRITICAL RESULT CALLED TO, READ BACK BY AND VERIFIED WITH: GRAM POSITIVE RODS C CREECH@0650  03/02/15 Performed at [facility]    Culture PENDING  Incomplete   Report Status PENDING  Incomplete  MRSA PCR Screening     Status: Abnormal   Collection Time: 03/01/15  9:34 PM  Result Value Ref Range Status   MRSA by PCR POSITIVE (A) NEGATIVE Final    Comment:        The GeneXpert MRSA Assay (FDA approved for NASAL specimens only), is one component of a comprehensive MRSA colonization surveillance program. It is not intended to diagnose MRSA infection nor to guide  or monitor treatment for MRSA infections. RESULT CALLED TO, READ BACK BY AND VERIFIED WITH: M.REEVES,RN AT 2334 ON 03/02/15 BY W.SHEA          Studies: Dg Lumbar Spine 2-3 Views  03/01/2015   CLINICAL DATA:  Mid back pain beginning 1 week ago.  EXAM: LUMBAR SPINE - 2-3 VIEW  COMPARISON:  04/05/2012  FINDINGS: Dextroconvex thoracolumbar scoliosis with rotary component. Loss of intervertebral disc height at multiple levels. The angulation of the lumbar spine causes the lateral projection to be oblique, causing poor definition of vertebral endplate cortical margins on the lateral projection.  Grade 1 anterolisthesis at L4-5, likely degenerative. Grade 1 retrolisthesis at L3-4 is suggested.  Aortoiliac atherosclerotic vascular disease. There is considerable loss of intervertebral disc height throughout the lumbar spine but with some sparing at the L1- 2 level.  IMPRESSION: 1. Considerable lumbar spondylosis, scoliosis, and degenerative disc disease with mild subluxations at L3-4 and L4-5. The scoliotic curvature oblique and indistinct on the lateral projection. I do not see a definite fracture. MRI (or CT) would be a superior way to assess the lumbar spine for impingement given the limitations due to the degree of scoliosis.   Electronically Signed   By: Van Clines M.D.   On: 03/01/2015 18:39   Dg Chest Portable 1 View  03/01/2015   CLINICAL DATA:  Per the patient the mid back pain began a week ago, and briefly became better before becoming worse again today. The patient reports experiencing nausea and 1 episode of emesis this AM.  EXAM: PORTABLE CHEST - 1 VIEW  COMPARISON:  11/28/2014  FINDINGS: Cardiac silhouette normal in size and configuration. Aorta is mildly uncoiled. No mediastinal or hilar masses or evidence of adenopathy. Clear lungs. No pleural effusion or pneumothorax.  Bony thorax is grossly intact.  IMPRESSION: No acute cardiopulmonary disease.   Electronically Signed   By: Lajean Manes M.D.   On: 03/01/2015 14:49        Scheduled Meds: . baclofen  5 mg Oral TID  . hydrocortisone sod succinate (SOLU-CORTEF) inj  50 mg Intravenous Q6H  . insulin aspart  0-5 Units Subcutaneous QHS  . insulin aspart  0-9 Units Subcutaneous TID WC  . pantoprazole (PROTONIX) IV  40 mg Intravenous Q24H  . piperacillin-tazobactam (ZOSYN)  IV  3.375 g Intravenous Q8H  . sodium chloride  3 mL Intravenous Q12H  . vancomycin  1,000 mg Intravenous Q24H   Continuous Infusions: . sodium chloride 125 mL/hr at 03/02/15 0540    Principal Problem:   Sepsis-hypothermia, hypotension, tachycardia, leukocytosis Active Problems:   Multiple sclerosis   Diabetes mellitus   Hypertension   Lymphedema-with possible cellulitis of right leg   Decubitus ulcers   Dehydration   Acute kidney injury    Time spent:  45 minutes.   Informed patient's PCP of hospitalization on 9/13   [facility], MD, FACP, FHM. [facility] Pager [phone]  If 7PM-7AM, please contact night-coverage [email or web address] Password [email or web address] 03/02/2015, 9:00 AM    LOS: 1 day

## 2015-03-02 NOTE — Consult Note (Signed)
Name: Jocelyn Sanchez MRN: [id number] DOB: August 30, 1955    ADMISSION DATE:  03/01/2015 CONSULTATION DATE:  9/13  REFERRING MD :  Algis Liming   CHIEF COMPLAINT:  Hypotension   HPI   59 year old female w/ debilitating MS. She is WC bound at baseline. Additional sig comorbids include: DM type 2, chronic anemia, chronic LE lymphedema. Presents to the ED on 9/12 w/ CC: low back pain. On evaluation found to be hypotensive w/ SBP in 70s, WBC ct 28.7, and LA of 1.7. She was admitted to the SDU w/ working dx of UTI vs LE cellulitis. Her Hgb was 6.8. Cultures were obtained, empiric abx were started, and she was given several liters of NS as well as 2 units of blood. In spite of this her BP remained very labile t/o the night fluctuating between SBP in 50s to 100s. Her MS has been stable, her UOP has been good. BC resulted on 9/13 w/ GNR. PCCM has been asked to see on 9/13 d/t her fluctuating BP.  SIGNIFICANT EVENTS  9/12 - Admission to hospital 9/12 - Transfusion of PRBC  STUDIES:   PAST MEDICAL HISTORY :   has a past medical history of MS (multiple sclerosis); HTN (hypertension); DM II (diabetes mellitus, type II), controlled; Lymphedema; Anemia; and Gait disorder.  has no past surgical history on file. Prior to Admission medications   Medication Sig Start Date End Date Taking? Authorizing Provider  aspirin EC 81 MG EC tablet Take 1 tablet (81 mg total) by mouth daily. 12/04/14  Yes Dixie Dials, MD  baclofen (LIORESAL) 10 MG tablet Take 0.5 tablets (5 mg total) by mouth 3 (three) times daily. 05/14/13  Yes Dennie Bible, NP  BENICAR HCT 20-12.5 MG per tablet Take 1 tablet by mouth daily. 01/01/15  Yes Historical Provider, MD  diphenhydramine-acetaminophen (TYLENOL PM) 25-500 MG TABS Take 1-2 tablets by mouth at bedtime as needed (sleep/pain).    Yes Historical Provider, MD  ferrous sulfate (FERROUSUL) 325 (65 FE) MG tablet Take 1 tablet (325 mg total) by mouth daily with breakfast. 12/04/14  Yes  Dixie Dials, MD  glipiZIDE (GLUCOTROL) 5 MG tablet Take 0.5 tablets (2.5 mg total) by mouth daily before breakfast. Patient taking differently: Take 5 mg by mouth 2 (two) times daily before a meal.  12/04/14  Yes Dixie Dials, MD  HYDROcodone-acetaminophen (NORCO/VICODIN) 5-325 MG per tablet Take 2 tablets by mouth every 4 (four) hours as needed. Patient taking differently: Take 2 tablets by mouth every 4 (four) hours as needed for moderate pain.  10/24/14  Yes Noemi Chapel, MD  ibuprofen (ADVIL,MOTRIN) 400 MG tablet Take 1 tablet (400 mg total) by mouth 2 (two) times daily with a meal. Patient taking differently: Take 400 mg by mouth 2 (two) times daily as needed for headache or moderate pain.  12/04/14  Yes Dixie Dials, MD  methocarbamol (ROBAXIN) 500 MG tablet Take 1 tablet (500 mg total) by mouth 2 (two) times daily as needed for muscle spasms. Patient taking differently: Take 250 mg by mouth every 8 (eight) hours as needed for muscle spasms.  01/07/15  Yes Delsa Grana, PA-C   Allergies  Allergen Reactions  . Sulfa Antibiotics Shortness Of Breath and Swelling    FAMILY HISTORY:  family history includes Multiple sclerosis in her mother. SOCIAL HISTORY:  reports that she has quit smoking. Her smoking use included Cigarettes. She has never used smokeless tobacco. She reports that she does not drink alcohol or use illicit drugs.  REVIEW OF SYSTEMS:   Constitutional: Negative for fever, chills, weight loss, malaise/fatigue and diaphoresis.  HENT: Negative for hearing loss, ear pain, nosebleeds, congestion, sore throat, neck pain, tinnitus and ear discharge.   Eyes: Negative for blurred vision, double vision, photophobia, pain, discharge and redness.  Respiratory: Negative for cough, hemoptysis, sputum production, shortness of breath, wheezing and stridor.   Cardiovascular: Negative for chest pain, palpitations, orthopnea, claudication, leg swelling and PND.  Gastrointestinal: Negative for  heartburn, nausea, vomiting, abdominal pain, diarrhea, constipation, blood in stool and melena.  Genitourinary: Negative for dysuria, urgency, frequency, hematuria and flank pain.  Musculoskeletal: Negative for myalgias, back pain, joint pain and falls.  Skin: Negative for itching and rash.  Neurological: Negative for dizziness, tingling, tremors, sensory change, speech change, focal weakness, seizures, loss of consciousness, weakness and headaches.  Endo/Heme/Allergies: Negative for environmental allergies and polydipsia. Does not bruise/bleed easily.  SUBJECTIVE:  No distress.  VITAL SIGNS: Temp:  [96.8 F (36 C)-100.4 F (38 C)] 100.4 F (38 C) (09/13 0800) Pulse Rate:  [43-128] 118 (09/13 0800) Resp:  [10-21] 16 (09/13 0800) BP: (60-117)/(33-84) 90/54 mmHg (09/13 0800) SpO2:  [93 %-100 %] 98 % (09/13 0800) Weight:  [74.844 kg (165 lb)] 74.844 kg (165 lb) (09/12 1948)  PHYSICAL EXAMINATION: General:  Chronic ill appearing AAF  Neuro:  Awake, alert, contracted extremities w/ limited mobility  HEENT:  NCAT, cheeks are flushed  Cardiovascular:  Tachy rrr  Lungs:  Clear and decreased in bases  Abdomen:  Soft, non-tender + bowel sounds  Musculoskeletal:  contracted Skin:  Chronic LE lymphedema    Recent Labs Lab 03/01/15 1326 03/02/15 0522  NA 134* 141  K 4.3 3.3*  CL 103 115*  CO2 17* 15*  BUN 94* 54*  CREATININE 1.81* 0.91  GLUCOSE 194* 92    Recent Labs Lab 03/01/15 1326 03/02/15 0522  HGB 6.8* 11.4*  HCT 20.5* 34.3*  WBC 28.7* 27.6*  PLT 646* 386   Dg Lumbar Spine 2-3 Views  03/01/2015   CLINICAL DATA:  Mid back pain beginning 1 week ago.  EXAM: LUMBAR SPINE - 2-3 VIEW  COMPARISON:  04/05/2012  FINDINGS: Dextroconvex thoracolumbar scoliosis with rotary component. Loss of intervertebral disc height at multiple levels. The angulation of the lumbar spine causes the lateral projection to be oblique, causing poor definition of vertebral endplate cortical margins on  the lateral projection.  Grade 1 anterolisthesis at L4-5, likely degenerative. Grade 1 retrolisthesis at L3-4 is suggested.  Aortoiliac atherosclerotic vascular disease. There is considerable loss of intervertebral disc height throughout the lumbar spine but with some sparing at the L1- 2 level.  IMPRESSION: 1. Considerable lumbar spondylosis, scoliosis, and degenerative disc disease with mild subluxations at L3-4 and L4-5. The scoliotic curvature oblique and indistinct on the lateral projection. I do not see a definite fracture. MRI (or CT) would be a superior way to assess the lumbar spine for impingement given the limitations due to the degree of scoliosis.   Electronically Signed   By: Van Clines M.D.   On: 03/01/2015 18:39   Dg Chest Portable 1 View  03/01/2015   CLINICAL DATA:  Per the patient the mid back pain began a week ago, and briefly became better before becoming worse again today. The patient reports experiencing nausea and 1 episode of emesis this AM.  EXAM: PORTABLE CHEST - 1 VIEW  COMPARISON:  11/28/2014  FINDINGS: Cardiac silhouette normal in size and configuration. Aorta is mildly uncoiled. No mediastinal or hilar  masses or evidence of adenopathy. Clear lungs. No pleural effusion or pneumothorax.  Bony thorax is grossly intact.  IMPRESSION: No acute cardiopulmonary disease.   Electronically Signed   By: Lajean Manes M.D.   On: 03/01/2015 14:49    ASSESSMENT / PLAN:  GNR bacteremia w/ resultant sepsis and resolving septic shock. Urinary source versus integument from decubitus ulcer or lymphedema.  BP very labile but MS is stable, UOP good, and end-organ fxn has improved (scr normalized). I wonder about her autonomic fxn in general given how labile her BP is w/ no change in MS and improvement in all other parameters.  Plan Cont IVFs (even to positive fluid balance) Hold antihypertensives Add oral midodrine for now Dc solucortef (cortisol nml) Cont vancomycin given MRSA swab  positive Cont zosyn, narrow as culture sensitivities dictate  AKI (resolved) Metabolic acidosis (NAG). Most likely d/t hyperchloremia after aggressive volume resuscitation  Plan  Bicarb gtt started by primary team Close obs for hypokalemia  F/u chem in am   Anemia w/out evidence of hemorrhage, now s/p transfusion Plan Trend CBC at least daily  Chronic lymphedema LE Plan Cont supportive care.   DM type II Plan ssi w/ accuchecks  Stage II sacral decubitus ulcer Plan - Wound care consultation.  All other issues per primary team. We will see again in am. Think the biggest focus at this point should be to ensure appropriate abx once sensitivities are resulted.   Sonia Baller Ashok Cordia, M.D. [facility] Pulmonary [facility] Critical [facility] Pager:  [phone] After 3pm or if no response, call [phone] 03/02/2015, 9:11 AM

## 2015-03-02 NOTE — Progress Notes (Signed)
PT Cancellation Note  Patient Details Name: Jocelyn Sanchez MRN: [id number] DOB: 1955-07-27   Cancelled Treatment:    Reason Eval/Treat Not Completed: Medical issues which prohibited therapy (per RN pt's BP has been unstable and she just got an a-line, will check back tomorrow. )   [name] 03/02/2015, 11:53 AM [phone]

## 2015-03-02 NOTE — Progress Notes (Addendum)
Pt here with sepsis with hx of MS, gait disturbance, HTN, DM2, among others. On Vanc and Zosyn empirically. On admit, Hgb low so 2 Units PRBCs ordered. Tonight, she continues to read hypotensive but is mentating well and making adequate urine. She has at time of this note, received 2 Units blood and 6L IVF in boluses and maintenance fluid since admit. Given MS and evidence of contractures, will place Aline to make sure we are getting an accurate BP, especially given the fact that her mental status is normal and she continues to make urine even with a BP documented in the high 60s. Spoke to [facility], Dr. Nelda Marseille, who agrees with Aline first. If BP is confirmed that low, will recall PCCM to see pt tonight and consider pressors. Labs this am-Lactate, CBC, CMP, etc-having to wait 2 hours after transfusion finished to get labs. No evidence of bleeding.  Jocelyn Boll, NP [facility] Update: RT unable to start A line. However, BP has come up nicely now. Will follow. F/up labs post transfusion. KJKG, NP

## 2015-03-02 NOTE — Progress Notes (Signed)
Ref: [name], MD   Subjective:  Drowsy. T max 100.4. Tachycardic. Hypotensive and hypokalemic.  Objective:  Vital Signs in the last 24 hours: Temp:  [97.6 F (36.4 C)-100.4 F (38 C)] 99.4 F (37.4 C) (09/13 1210) Pulse Rate:  [107-128] 111 (09/13 1316) Cardiac Rhythm:  [-] Sinus tachycardia (09/13 0817) Resp:  [10-21] 14 (09/13 1316) BP: (60-117)/(33-84) 100/60 mmHg (09/13 0900) SpO2:  [96 %-100 %] 99 % (09/13 1316) Arterial Line BP: (76-104)/(33-49) 91/42 mmHg (09/13 1316) Weight:  [74.844 kg (165 lb)] 74.844 kg (165 lb) (09/12 1948)  Physical Exam: BP Readings from Last 1 Encounters:  03/02/15 100/60    Wt Readings from Last 1 Encounters:  03/01/15 74.844 kg (165 lb)    Weight change:   HEENT: [place]/AT, Eyes-Brown, Conjunctiva-Pink, Sclera-Non-icteric Neck: No JVD, No bruit, Trachea midline. Lungs:  Clear, Bilateral. Cardiac:  Regular rhythm, normal S1 and S2, no S3.  Abdomen:  Soft, non-tender. Extremities:  Trace edema present. No cyanosis. No clubbing. Bilateral lower leg skin thickening, hyperpigmentation and mild erythema. Chronic contractures of bilateral upper extremities. Motorized Wheel chair bound. CNS: AxOx3, Cranial nerves grossly intact, moves all 4 extremities. Right handed. Skin: Warm and dry.   Intake/Output from previous day: 09/12 0701 - 09/13 0700 In: 2514.3 [I.V.:799.3; Blood:665; IV Piggyback:50] Out: 1950 [Urine:1950]    Lab Results: BMET    Component Value Date/Time   NA 141 03/02/2015 0522   NA 134* 03/01/2015 1326   NA 136 02/17/2015 2032   K 3.3* 03/02/2015 0522   K 4.3 03/01/2015 1326   K 3.6 02/17/2015 2032   CL 115* 03/02/2015 0522   CL 103 03/01/2015 1326   CL 108 02/17/2015 2032   CO2 15* 03/02/2015 0522   CO2 17* 03/01/2015 1326   CO2 17* 02/17/2015 2032   GLUCOSE 92 03/02/2015 0522   GLUCOSE 194* 03/01/2015 1326   GLUCOSE 50* 02/17/2015 2032   BUN 54* 03/02/2015 0522   BUN 94* 03/01/2015 1326   BUN 71* 02/17/2015  2032   CREATININE 0.91 03/02/2015 0522   CREATININE 1.81* 03/01/2015 1326   CREATININE 0.84 02/17/2015 2032   CALCIUM 8.2* 03/02/2015 0522   CALCIUM 9.3 03/01/2015 1326   CALCIUM 9.3 02/17/2015 2032   GFRNONAA >60 03/02/2015 0522   GFRNONAA 29* 03/01/2015 1326   GFRNONAA >60 02/17/2015 2032   GFRAA >60 03/02/2015 0522   GFRAA 34* 03/01/2015 1326   GFRAA >60 02/17/2015 2032   CBC    Component Value Date/Time   WBC 27.6* 03/02/2015 0522   RBC 4.10 03/02/2015 0522   RBC 3.67* 03/01/2015 2130   HGB 11.4* 03/02/2015 0522   HCT 34.3* 03/02/2015 0522   PLT 386 03/02/2015 0522   MCV 83.7 03/02/2015 0522   MCH 27.8 03/02/2015 0522   MCHC 33.2 03/02/2015 0522   RDW 15.0 03/02/2015 0522   LYMPHSABS 0.9 03/01/2015 1326   MONOABS 0.6 03/01/2015 1326   EOSABS 0.0 03/01/2015 1326   BASOSABS 0.0 03/01/2015 1326   HEPATIC Function Panel  Recent Labs  11/28/14 1115 03/01/15 1326 03/02/15 0522  PROT 8.0 7.8 6.3*   HEMOGLOBIN A1C No components found for: HGA1C,  MPG CARDIAC ENZYMES No results found for: CKTOTAL, CKMB, CKMBINDEX, TROPONINI BNP No results for input(s): PROBNP in the last 8760 hours. TSH  Recent Labs  11/28/14 1115  TSH 1.886   CHOLESTEROL No results for input(s): CHOL in the last 8760 hours.  Scheduled Meds: . baclofen  5 mg Oral TID  . insulin  aspart  0-5 Units Subcutaneous QHS  . insulin aspart  0-9 Units Subcutaneous TID WC  . midodrine  5 mg Oral TID WC  . pantoprazole (PROTONIX) IV  40 mg Intravenous Q24H  . piperacillin-tazobactam (ZOSYN)  IV  3.375 g Intravenous Q8H  . potassium chloride  40 mEq Oral Once  . sodium chloride  3 mL Intravenous Q12H  . vancomycin  1,000 mg Intravenous Q12H   Continuous Infusions: .  sodium bicarbonate 150 mEq in sterile water 1000 mL infusion 100 mL/hr at 03/02/15 1002   PRN Meds:.Place/Maintain arterial line **AND** sodium chloride, acetaminophen **OR** [DISCONTINUED] acetaminophen, albuterol,  methocarbamol  Assessment/Plan: Severe sepsis with septic shock secondary to GNR bacteremia from UTI Dehydration Acute renal injury Anemia Multiple sclerosis DM, II Chronic lymphedema of legs Stage II sacral decubitus ulcer Hypokalemia  Agree with fluid resuscitation Additional potassium supplementation. Appreciate CCM consult.   LOS: 1 day    Dixie Dials  MD  03/02/2015, 1:52 PM

## 2015-03-02 NOTE — Progress Notes (Signed)
OT Cancellation Note  Patient Details Name: Jocelyn Sanchez MRN: [id number] DOB: [birth year]   Cancelled Treatment:    Reason Eval/Treat Not Completed: Other (comment). Spoke to RN,  pt just had A-line placed. Will check back  Welton Bord 03/02/2015, 11:38 AM  Lesle Chris, OTR/L [phone] 03/02/2015

## 2015-03-02 NOTE — Consult Note (Signed)
WOC wound consult note Reason for Consult:Patient with MS, long-standing lymphedema and recurrence of pressure injury over area that had been previously healed.  Has indwelling urinary catheter and is incontinent of stool. Wound type: moisture associated skin damage plus pressure and friction in an area of previous ulceration Pressure Ulcer POA: Yes Measurement:1.5cm x 2cm x 0.2cm Stage 2 in an area measuring 6cm x 8cm of scar tissue (indicative of a full thickness pressure injury in this area previously) Wound TKW:IOXBD, pink, moist Drainage (amount, consistency, odor) scant serous  Periwound: as noted above Dressing procedure/placement/frequency:Patient is refusing to turn in the frequency recommended, but today is amenable to POC proposed by this writer:  Turning ide to side and avoiding the supine position except for meals.  A soft silicone foam dressing is provided for the PrI as well as bilateral pressure redistribution heel boots to prevent injury in the presence of the weight and immobility resulting from the lymphedema.  Upon transfer from the ICU to the floor, I have provided a nursing order for a therapeutic mattress with low air loss feature. [place] nursing team will not follow, but will remain available to this patient, the nursing and medical team.  Please re-consult if needed. Thanks, Maudie Flakes, MSN, RN, [place], [place], [place] [phone])

## 2015-03-02 NOTE — Progress Notes (Signed)
RT attempted Arterial Line without success. RN aware.

## 2015-03-03 DIAGNOSIS — R579 Shock, unspecified: Secondary | ICD-10-CM

## 2015-03-03 DIAGNOSIS — R7881 Bacteremia: Secondary | ICD-10-CM

## 2015-03-03 LAB — CBC
HEMATOCRIT: 34.3 % — AB (ref 36.0–46.0)
HEMATOCRIT: 27.9 % — AB (ref 36.0–46.0)
HEMOGLOBIN: 11.4 g/dL — AB (ref 12.0–15.0)
HEMOGLOBIN: 9.7 g/dL — AB (ref 12.0–15.0)
MCH: 27.8 pg (ref 26.0–34.0)
MCH: 28 pg (ref 26.0–34.0)
MCHC: 33.2 g/dL (ref 30.0–36.0)
MCHC: 34.8 g/dL (ref 30.0–36.0)
MCV: 83.7 fL (ref 78.0–100.0)
MCV: 80.4 fL (ref 78.0–100.0)
Platelets: 386 10*3/uL (ref 150–400)
Platelets: 339 10*3/uL (ref 150–400)
RBC: 4.1 MIL/uL (ref 3.87–5.11)
RBC: 3.47 MIL/uL — ABNORMAL LOW (ref 3.87–5.11)
RDW: 15 % (ref 11.5–15.5)
RDW: 14.8 % (ref 11.5–15.5)
WBC: 27.6 10*3/uL — AB (ref 4.0–10.5)
WBC: 30.9 10*3/uL — ABNORMAL HIGH (ref 4.0–10.5)

## 2015-03-03 LAB — TYPE AND SCREEN
ABO/RH(D): O POS
Antibody Screen: NEGATIVE
Unit division: 0
Unit division: 0

## 2015-03-03 LAB — GLUCOSE, CAPILLARY
Glucose-Capillary: 130 mg/dL — ABNORMAL HIGH (ref 65–99)
Glucose-Capillary: 177 mg/dL — ABNORMAL HIGH (ref 65–99)
Glucose-Capillary: 154 mg/dL — ABNORMAL HIGH (ref 65–99)
Glucose-Capillary: 132 mg/dL — ABNORMAL HIGH (ref 65–99)

## 2015-03-03 LAB — COMPREHENSIVE METABOLIC PANEL
ALK PHOS: 73 U/L (ref 38–126)
ALT: 8 U/L — ABNORMAL LOW (ref 14–54)
ANION GAP: 8 (ref 5–15)
AST: 15 U/L (ref 15–41)
Albumin: 1.8 g/dL — ABNORMAL LOW (ref 3.5–5.0)
BILIRUBIN TOTAL: 0.6 mg/dL (ref 0.3–1.2)
BUN: 21 mg/dL — ABNORMAL HIGH (ref 6–20)
CALCIUM: 7.6 mg/dL — AB (ref 8.9–10.3)
CO2: 24 mmol/L (ref 22–32)
Chloride: 110 mmol/L (ref 101–111)
Creatinine, Ser: 0.54 mg/dL (ref 0.44–1.00)
Glucose, Bld: 154 mg/dL — ABNORMAL HIGH (ref 65–99)
Potassium: 2.8 mmol/L — ABNORMAL LOW (ref 3.5–5.1)
SODIUM: 142 mmol/L (ref 135–145)
TOTAL PROTEIN: 5.3 g/dL — AB (ref 6.5–8.1)

## 2015-03-03 MED ORDER — PANTOPRAZOLE SODIUM 40 MG PO TBEC
40.0000 mg | DELAYED_RELEASE_TABLET | Freq: Every day | ORAL | Status: DC
  Administered 2015-03-03 – 2015-03-11 (×9): 40 mg via ORAL
  Filled 2015-03-03 (×13): qty 1

## 2015-03-03 MED ORDER — CHLORHEXIDINE GLUCONATE CLOTH 2 % EX PADS
6.0000 | MEDICATED_PAD | Freq: Every day | CUTANEOUS | Status: AC
  Administered 2015-03-03 – 2015-03-07 (×5): 6 via TOPICAL

## 2015-03-03 MED ORDER — LACTATED RINGERS IV BOLUS (SEPSIS)
2000.0000 mL | Freq: Once | INTRAVENOUS | Status: AC
  Administered 2015-03-03: 2000 mL via INTRAVENOUS

## 2015-03-03 MED ORDER — POTASSIUM CHLORIDE CRYS ER 20 MEQ PO TBCR
40.0000 meq | EXTENDED_RELEASE_TABLET | ORAL | Status: AC
  Administered 2015-03-03 (×2): 40 meq via ORAL
  Filled 2015-03-03 (×2): qty 2

## 2015-03-03 MED ORDER — MUPIROCIN 2 % EX OINT
1.0000 "application " | TOPICAL_OINTMENT | Freq: Two times a day (BID) | CUTANEOUS | Status: AC
  Administered 2015-03-03 – 2015-03-07 (×10): 1 via NASAL
  Filled 2015-03-03 (×2): qty 22

## 2015-03-03 MED ORDER — SODIUM CHLORIDE 0.9 % IV SOLN
INTRAVENOUS | Status: DC
  Administered 2015-03-03: 07:00:00 via INTRAVENOUS

## 2015-03-03 MED ORDER — SODIUM CHLORIDE 0.9 % IV BOLUS (SEPSIS)
500.0000 mL | Freq: Once | INTRAVENOUS | Status: AC
  Administered 2015-03-03: 500 mL via INTRAVENOUS

## 2015-03-03 MED ORDER — HEPARIN SODIUM (PORCINE) 5000 UNIT/ML IJ SOLN
5000.0000 [IU] | Freq: Three times a day (TID) | INTRAMUSCULAR | Status: DC
  Administered 2015-03-03 – 2015-03-12 (×28): 5000 [IU] via SUBCUTANEOUS
  Filled 2015-03-03 (×31): qty 1

## 2015-03-03 NOTE — Progress Notes (Signed)
[place] Progress Note [name] DOB: 1955/08/22 MRN: [id number]   Date of Service  03/03/2015  HPI/Events of Note  Hypokalemia exacerbated by bicarb gtt.  Bicarb on BMET now 24 with K of 2.8  eICU Interventions  Plan: Potassium replaced Bicarb gtt d/ced NS at [facility] ordered     Intervention Category Intermediate Interventions: Electrolyte abnormality - evaluation and management  Noble Bodie 03/03/2015, 6:32 AM

## 2015-03-03 NOTE — Progress Notes (Signed)
Patients BP dropping with the MAP currently 58. NP on call notified. Will continue to monitor. Luther Parody, RN

## 2015-03-03 NOTE — Evaluation (Signed)
Physical Therapy Evaluation Patient Details Name: Jocelyn Sanchez MRN: [id number] DOB: [birth year] Today's Date: 03/03/2015   History of Present Illness  Pt was admitted for sepsis.  She presented to ED with back pain.  Pt has a PMH significant for MS,DM, HTN and chronic LE lymphedema  Clinical Impression  Patient evaluated by Physical Therapy with no further acute PT needs identified. All education has been completed and the patient has no further questions. See below for any follow-up Physial Therapy or equipment needs. PT is signing off. Thank you for this referral.     Follow Up Recommendations No PT follow up    Equipment Recommendations  None recommended by PT    Recommendations for Other Services       Precautions / Restrictions Precautions Precautions: Fall Restrictions Weight Bearing Restrictions: Yes RLE Weight Bearing: Non weight bearing LLE Weight Bearing: Non weight bearing      Mobility  Bed Mobility               General bed mobility comments: NT --pt is hoyer lift at home adn reports she can't tol rolling; she states her aide normally" does  it for her"  Transfers                 General transfer comment: NT -hoyer lift at home  Ambulation/Gait                Stairs            Wheelchair Mobility    Modified Rankin (Stroke Patients Only)       Balance                                             Pertinent Vitals/Pain Pain Assessment: Faces Faces Pain Scale: Hurts little more Pain Location: RUE adn LEs with movement Pain Intervention(s): Limited activity within patient's tolerance;Monitored during session    Home Living Family/patient expects to be discharged to:: Private residence Living Arrangements: Parent Available Help at Discharge: Family;Personal care attendant Type of Home: House Home Access: Ramped entrance     Home Layout: One level Home Equipment: Wheelchair - power;Hospital  bed Additional Comments: has aide 6hrs per day    Prior Function Level of Independence: Needs assistance   Gait / Transfers Assistance Needed: hoyer lift  ADL's / Homemaking Assistance Needed: total care for ADLS, feeds self        Hand Dominance   Dominant Hand: Right    Extremity/Trunk Assessment   Upper Extremity Assessment: RUE deficits/detail;LUE deficits/detail RUE Deficits / Details: pt states that she is R dominant.  She has an A-line and extremity is painful.  Assisted with rolling forearm to neutral--pt unable to move fingers.  She states that function varies. She did not want me to move RUE any more at this time until A-line is removed.     LUE Deficits / Details: able to lift shoulder approximately 15 degrees AROM, elbow 60 degrees in midrange, wrist and hand WFLs.  Hand has edema   Lower Extremity Assessment: LLE deficits/detail RLE Deficits / Details: pt reports aide does some PROM to LE when she can tolerate it; pt unable to tol any imposed movement today; sensation intact to light touch LLE Deficits / Details: intact to light touch     Communication   Communication: No difficulties  Cognition Arousal/Alertness: Awake/alert Behavior  During Therapy: WFL for tasks assessed/performed Overall Cognitive Status: Within Functional Limits for tasks assessed                      General Comments      Exercises        Assessment/Plan    PT Assessment Patent does not need any further PT services  PT Diagnosis     PT Problem List    PT Treatment Interventions     PT Goals (Current goals can be found in the Care Plan section) Acute Rehab PT Goals Patient Stated Goal: decreased pain/for leg swelling to continue to decrease PT Goal Formulation: All assessment and education complete, DC therapy    Frequency     Barriers to discharge        Co-evaluation   Reason for Co-Treatment: Complexity of the patient's impairments (multi-system  involvement)   OT goals addressed during session: Strengthening/ROM       End of Session   Activity Tolerance: Patient limited by pain Patient left: in bed;with call bell/phone within reach;with family/visitor present Nurse Communication: Mobility status         Time: 0962-8366 PT Time Calculation (min) (ACUTE ONLY): 17 min   Charges:   PT Evaluation $Initial PT Evaluation Tier I: 1 Procedure     PT G CodesKenyon Sanchez 26-Mar-2015, 4:55 PM

## 2015-03-03 NOTE — Progress Notes (Signed)
Name: Jocelyn Sanchez MRN: [id number] DOB: 22-Mar-1956    ADMISSION DATE:  03/01/2015 CONSULTATION DATE:  9/13  REFERRING MD :  Algis Liming   CHIEF COMPLAINT:  Septic Shock  SIGNIFICANT EVENTS  9/12 - Admission to hospital 9/12 - Transfusion of PRBC 9/13 - Radial arterial line placed.  STUDIES:  Portable CXR 9/12 - no opacification. No pleural effusion.  SUBJECTIVE: Patient with continued intermittent hypotension overnight. Denies any chest pain or pressure. Denies any subjective fever, chills, or sweats.  ROS: Denies any headache or vision changes. Denies any dyspnea or cough.  VITAL SIGNS: Temp:  [98 F (36.7 C)-102.1 F (38.9 C)] 100.4 F (38 C) (09/14 0355) Pulse Rate:  [111-125] 115 (09/14 0700) Resp:  [12-24] 20 (09/14 0700) BP: (82-95)/(43-46) 82/45 mmHg (09/14 0700) SpO2:  [96 %-99 %] 96 % (09/14 0700) Arterial Line BP: (59-110)/(33-66) 59/51 mmHg (09/14 0700) Weight:  [81.1 kg (178 lb 12.7 oz)] 81.1 kg (178 lb 12.7 oz) (09/14 0355)  PHYSICAL EXAMINATION: General:  Awake. Alert. No acute distress.   Integument:  Warm & dry. No rash on exposed skin. No bruising. sacral decubitus. HEENT:  Moist mucus membranes. No oral ulcers. No scleral injection or icterus. PERRL. Cardiovascular:  Regular rhythm. Improving bilateral lower extremity edema.  Pulmonary:  Good aeration & clear to auscultation bilaterally. Symmetric chest wall expansion. No accessory muscle useon room air. Abdomen: Soft. Normal bowel sounds. Nondistended. Grossly nontender. Neurological:  CN 2-12 grossly in tact. No meningismus. Moving all 4 extremities equally.    Recent Labs Lab 03/01/15 1326 03/02/15 0522 03/03/15 0525  NA 134* 141 142  K 4.3 3.3* 2.8*  CL 103 115* 110  CO2 17* 15* 24  BUN 94* 54* 21*  CREATININE 1.81* 0.91 0.54  GLUCOSE 194* 92 154*    Recent Labs Lab 03/01/15 1326 03/02/15 0522 03/03/15 0525  HGB 6.8* 11.4* 9.7*  HCT 20.5* 34.3* 27.9*  WBC 28.7* 27.6* 30.9*  PLT  646* 386 339   Dg Lumbar Spine 2-3 Views  03/01/2015   CLINICAL DATA:  Mid back pain beginning 1 week ago.  EXAM: LUMBAR SPINE - 2-3 VIEW  COMPARISON:  04/05/2012  FINDINGS: Dextroconvex thoracolumbar scoliosis with rotary component. Loss of intervertebral disc height at multiple levels. The angulation of the lumbar spine causes the lateral projection to be oblique, causing poor definition of vertebral endplate cortical margins on the lateral projection.  Grade 1 anterolisthesis at L4-5, likely degenerative. Grade 1 retrolisthesis at L3-4 is suggested.  Aortoiliac atherosclerotic vascular disease. There is considerable loss of intervertebral disc height throughout the lumbar spine but with some sparing at the L1- 2 level.  IMPRESSION: 1. Considerable lumbar spondylosis, scoliosis, and degenerative disc disease with mild subluxations at L3-4 and L4-5. The scoliotic curvature oblique and indistinct on the lateral projection. I do not see a definite fracture. MRI (or CT) would be a superior way to assess the lumbar spine for impingement given the limitations due to the degree of scoliosis.   Electronically Signed   By: Van Clines M.D.   On: 03/01/2015 18:39   Dg Chest Portable 1 View  03/01/2015   CLINICAL DATA:  Per the patient the mid back pain began a week ago, and briefly became better before becoming worse again today. The patient reports experiencing nausea and 1 episode of emesis this AM.  EXAM: PORTABLE CHEST - 1 VIEW  COMPARISON:  11/28/2014  FINDINGS: Cardiac silhouette normal in size and configuration. Aorta is mildly uncoiled. No  mediastinal or hilar masses or evidence of adenopathy. Clear lungs. No pleural effusion or pneumothorax.  Bony thorax is grossly intact.  IMPRESSION: No acute cardiopulmonary disease.   Electronically Signed   By: Lajean Manes M.D.   On: 03/01/2015 14:49    ASSESSMENT / PLAN: 59 year old female with chronic lower extremity lymphedema. Patient admitted with septic  shock likely secondary to a gram-negative rod bacteremia. Cultures are pending. Patient was initially transfused hemoglobin believing that her hypotension was secondary to GI bleed. Radial arterial line placed for more accurate blood pressure monitoring. Patient's mental status remains stable and respiratory status as well. Continuing close ICU monitoring given ongoing hypotension with further fluid resuscitation.   1. Septic shock: Trending Procalcitonin & lactic acid. Trending leukocytosis. Awaiting results of cultures. Sources gram-negative rod sepsis. Continuing vancomycin & Zosyn. Continuing IV fluid resuscitation with lactated Ringer's solution. Continuing monitoring. 2. Gram-negative rod bacteremia: Checking transthoracic echocardiogram. Plan to repeat blood cultures in 24-48 hours. Awaiting finalization of culture result. Continuing vancomycin & Zosyn. 3. Acute renal failure: Resolved. Likely due to shock. 4. Metabolic acidosis: Likely secondary to hyperchloremia. Resolved. Discontinued bicarbonate drip.  5. Hypokalemia: Potassium by mouth. Repeat electrolytes in a.m. with magnesium.  6. Chronic lymphedema with sacral decubitus ulcer: Wound care consulted.  7. Diabetes mellitus type 2: Continuing sliding scale insulin with Accu-Cheks every before meals & at bedtime. 8. Prophylaxis: Switching Protonix to by mouth daily. Continuing SCDs. Starting heparin subcutaneous every 8 hours.  9. Anemia: No signs of active bleeding. Continuing to monitor hemoglobin daily on CBC. Hemoccult pending. 10. Diet: Heart healthy carbohydrate modified diet.  Patient with ongoing intermittent hypotension from septic shock with rising leukocytosis from sepsis. Condition remains guarded with high potential for further clinical decompensation from multiple medical problems. She will remain in the intensive care unit under our care. I spent a total of 32 minutes of critical care time today caring for this patient.    Sonia Baller Ashok Cordia, M.D. [facility] Pager:  [phone] After 3pm or if no response, call [phone] 03/03/2015, 9:44 AM

## 2015-03-03 NOTE — Evaluation (Signed)
Occupational Therapy Evaluation Patient Details Name: Jocelyn Sanchez MRN: [id number] DOB: 09/05/1955 Today's Date: 03/03/2015    History of Present Illness Pt was admitted for sepsis.  She presented to ED with back pain.  Pt has a PMH significant for MS,DM, HTN and chronic LE lymphedema   Clinical Impression   This 59 year old female was admitted for the above.  Pt has assistance for all mobility and ADLs and uses RUE to maneuver power w/c.  At this time, pt has A line in RUE and was unable to move fingers/did not want to move arm with assistance.  She typically feeds herself with RUE when possible or L if unable.  She has needed total A with this in the hospital. Will follow in acute, and see her again once A-line is out to further assess self-feeding and HEP.  Built up foam was provided on this visit and instructions given to RN    Follow Up Recommendations  No OT follow up;Supervision/Assistance - 24 hour    Equipment Recommendations  None recommended by OT    Recommendations for Other Services       Precautions / Restrictions Precautions Precautions: Fall Restrictions RLE Weight Bearing: Non weight bearing LLE Weight Bearing: Non weight bearing      Mobility Bed Mobility                  Transfers                      Balance                                            ADL Overall ADL's : Needs assistance/impaired Eating/Feeding: Total assistance;Bed level                                     General ADL Comments: Pt normally feeds herself. She uses RUE if she has good movement, otherwise she uses L and reports that she makes a mess.  RN has been assisting in the hospital.  Provided red foam for utensils when A line is removed.  Aide assists with all other ADLs, pt uses a hoyer for OOB, and she is able to maneuver w/c using RUE on joystick     Vision     Perception     Praxis      Pertinent Vitals/Pain Pain  Assessment: Faces Faces Pain Scale: Hurts little more Pain Location: RUE when moved to neutral position Pain Intervention(s): Limited activity within patient's tolerance;Monitored during session     Hand Dominance Right   Extremity/Trunk Assessment Upper Extremity Assessment Upper Extremity Assessment: RUE deficits/detail;LUE deficits/detail RUE Deficits / Details: pt states that she is R dominant.  She has an A-line and extremity is painful.  Assisted with rolling forearm to neutral--pt unable to move fingers.  She states that function varies. She did not want me to move RUE any more at this time until A-line is removed. LUE Deficits / Details: able to lift shoulder approximately 15 degrees AROM, elbow 60 degrees in midrange, wrist and hand WFLs.  Hand has edema           Communication Communication Communication: No difficulties   Cognition Arousal/Alertness: Awake/alert Behavior During Therapy: WFL for tasks assessed/performed Overall Cognitive Status:  Within Functional Limits for tasks assessed                     General Comments       Exercises       Shoulder Instructions      Home Living Family/patient expects to be discharged to:: Private residence   Available Help at Discharge: Family;Personal care attendant (PCA comes 6 hours a day) Type of Home: House Home Access: Ramped entrance     Home Layout: One level               Home Equipment: Wheelchair - power;Hospital bed   Additional Comments: has aide 6hrs per day      Prior Functioning/Environment Level of Independence: Needs assistance             OT Diagnosis: Generalized weakness;Acute pain   OT Problem List: Decreased strength;Decreased activity tolerance;Pain;Impaired UE functional use   OT Treatment/Interventions: Self-care/ADL training;DME and/or AE instruction;Patient/family education    OT Goals(Current goals can be found in the care plan section) Acute Rehab OT  Goals Patient Stated Goal: decreased pain/for leg swelling to continue to decrease OT Goal Formulation: With patient Time For Goal Achievement: 03/10/15 Potential to Achieve Goals: Good ADL Goals Pt Will Perform Eating: with set-up;with adaptive utensils;sitting;bed level (one item, with foam as needed) Pt/caregiver will Perform Home Exercise Program: Increased strength;Both right and left upper extremity (AAROM with family's assistance)  OT Frequency: Min 2X/week   Barriers to D/C:            Co-evaluation PT/OT/SLP Co-Evaluation/Treatment: Yes due to complexity of pt     OT goals addressed during session: Strengthening/ROM;Proper use of Adaptive equipment and DME      End of Session    Activity Tolerance: Patient limited by fatigue Patient left: in bed;with call bell/phone within reach   Time: 1519-1536 OT Time Calculation (min): 17 min Charges:  OT General Charges $OT Visit: 1 Procedure OT Evaluation $Initial OT Evaluation Tier I: 1 Procedure G-Codes:    Zyah Gomm 2015/03/18, 4:04 PM Lesle Chris, OTR/L [phone] 18-Mar-2015

## 2015-03-03 NOTE — Progress Notes (Signed)
Discussed with Dr Ashok Cordia, CCM will take over patient care. Please call [facility] when needed.  Delena Casebeer, Md.

## 2015-03-04 ENCOUNTER — Inpatient Hospital Stay (HOSPITAL_COMMUNITY): Payer: Medicare Other

## 2015-03-04 DIAGNOSIS — A4151 Sepsis due to Escherichia coli [E. coli]: Principal | ICD-10-CM

## 2015-03-04 LAB — RENAL FUNCTION PANEL
ALBUMIN: 1.9 g/dL — AB (ref 3.5–5.0)
ANION GAP: 9 (ref 5–15)
BUN: 13 mg/dL (ref 6–20)
CO2: 25 mmol/L (ref 22–32)
Calcium: 7.8 mg/dL — ABNORMAL LOW (ref 8.9–10.3)
Chloride: 105 mmol/L (ref 101–111)
Creatinine, Ser: 0.51 mg/dL (ref 0.44–1.00)
GFR calc Af Amer: >60 mL/min (ref >60)
GFR calc non Af Amer: >60 mL/min (ref >60)
GLUCOSE: 154 mg/dL — AB (ref 65–99)
PHOSPHORUS: 1.1 mg/dL — AB (ref 2.5–4.6)
POTASSIUM: 3.7 mmol/L (ref 3.5–5.1)
Sodium: 139 mmol/L (ref 135–145)

## 2015-03-04 LAB — CBC WITH DIFFERENTIAL/PLATELET
BASOS ABS: 0 10*3/uL (ref 0.0–0.1)
Basophils Relative: 0 %
Eosinophils Absolute: 0 10*3/uL (ref 0.0–0.7)
Eosinophils Relative: 0 %
HEMATOCRIT: 30 % — AB (ref 36.0–46.0)
HEMOGLOBIN: 10.5 g/dL — AB (ref 12.0–15.0)
LYMPHS PCT: 5 %
Lymphs Abs: 1.8 10*3/uL (ref 0.7–4.0)
MCH: 28.3 pg (ref 26.0–34.0)
MCHC: 35 g/dL (ref 30.0–36.0)
MCV: 80.9 fL (ref 78.0–100.0)
MONOS PCT: 5 %
Monocytes Absolute: 1.8 10*3/uL — ABNORMAL HIGH (ref 0.1–1.0)
NEUTROS ABS: 31.7 10*3/uL — AB (ref 1.7–7.7)
NEUTROS PCT: 90 %
Platelets: 379 10*3/uL (ref 150–400)
RBC: 3.71 MIL/uL — ABNORMAL LOW (ref 3.87–5.11)
RDW: 15.1 % (ref 11.5–15.5)
WBC: 35.3 10*3/uL — ABNORMAL HIGH (ref 4.0–10.5)

## 2015-03-04 LAB — GLUCOSE, CAPILLARY
GLUCOSE-CAPILLARY: 139 mg/dL — AB (ref 65–99)
GLUCOSE-CAPILLARY: 219 mg/dL — AB (ref 65–99)
GLUCOSE-CAPILLARY: 152 mg/dL — AB (ref 65–99)
Glucose-Capillary: 176 mg/dL — ABNORMAL HIGH (ref 65–99)

## 2015-03-04 LAB — URINE CULTURE

## 2015-03-04 LAB — MAGNESIUM: Magnesium: 1.3 mg/dL — ABNORMAL LOW (ref 1.7–2.4)

## 2015-03-04 LAB — LACTIC ACID, PLASMA: LACTIC ACID, VENOUS: 1.5 mmol/L (ref 0.5–2.0)

## 2015-03-04 MED ORDER — MAGNESIUM SULFATE 2 GM/50ML IV SOLN
2.0000 g | Freq: Once | INTRAVENOUS | Status: AC
  Administered 2015-03-04: 2 g via INTRAVENOUS
  Filled 2015-03-04: qty 50

## 2015-03-04 MED ORDER — MIDODRINE HCL 2.5 MG PO TABS
2.5000 mg | ORAL_TABLET | Freq: Three times a day (TID) | ORAL | Status: DC
  Administered 2015-03-04 – 2015-03-05 (×3): 2.5 mg via ORAL
  Filled 2015-03-04 (×6): qty 1

## 2015-03-04 MED ORDER — SODIUM PHOSPHATE 3 MMOLE/ML IV SOLN
30.0000 mmol | Freq: Once | INTRAVENOUS | Status: AC
  Administered 2015-03-04: 30 mmol via INTRAVENOUS
  Filled 2015-03-04: qty 10

## 2015-03-04 MED ORDER — LACTATED RINGERS IV SOLN
INTRAVENOUS | Status: AC
  Administered 2015-03-04 (×2): via INTRAVENOUS
  Administered 2015-03-05: 1000 mL via INTRAVENOUS

## 2015-03-04 MED ORDER — ZOLPIDEM TARTRATE 5 MG PO TABS
5.0000 mg | ORAL_TABLET | Freq: Every evening | ORAL | Status: DC | PRN
  Administered 2015-03-04 – 2015-03-11 (×7): 5 mg via ORAL
  Filled 2015-03-04 (×7): qty 1

## 2015-03-04 NOTE — Progress Notes (Signed)
ANTIBIOTIC CONSULT NOTE - Follow Up  Pharmacy Consult for Vancomycin and Zosyn Indication: rule out sepsis  Allergies  Allergen Reactions  . Sulfa Antibiotics Shortness Of Breath and Swelling    Patient Measurements: Height: 5\' 3"  (160 cm) Weight: 178 lb 12.7 oz (81.1 kg) IBW/kg (Calculated) : 52.4 Height: 63 inches Weight: estimated 80kg  Vital Signs: Temp: 98.2 F (36.8 C) (09/15 1200) Temp Source: Oral (09/15 1200) BP: 137/67 mmHg (09/15 1400) Pulse Rate: 105 (09/15 1400) Intake/Output from previous day: 09/14 0701 - 09/15 0700 In: 1446 [P.O.:390; I.V.:196; IV Piggyback:860] Out: 1500 [Urine:1500] Intake/Output from this shift: Total I/O In: 1565 [P.O.:640; I.V.:625; IV Piggyback:300] Out: 850 [Urine:850]  Labs:  Recent Labs  03/02/15 0522 03/03/15 0525 03/04/15 0320  WBC 27.6* 30.9* 35.3*  HGB 11.4* 9.7* 10.5*  PLT 386 339 379  CREATININE 0.91 0.54 0.51   Estimated Creatinine Clearance: 75.4 mL/min (by C-G formula based on Cr of 0.51). No results for input(s): VANCOTROUGH, VANCOPEAK, VANCORANDOM, GENTTROUGH, GENTPEAK, GENTRANDOM, TOBRATROUGH, TOBRAPEAK, TOBRARND, AMIKACINPEAK, AMIKACINTROU, AMIKACIN in the last 72 hours.   Microbiology: Recent Results (from the past 720 hour(s))  Urine culture     Status: None   Collection Time: 02/17/15  8:53 PM  Result Value Ref Range Status   Specimen Description URINE, CATHETERIZED  Final   Special Requests Normal  Final   Culture   Final    NO GROWTH 2 DAYS Performed at [facility]    Report Status 02/19/2015 FINAL  Final  Culture, blood (x 2)     Status: None (Preliminary result)   Collection Time: 03/01/15  1:51 PM  Result Value Ref Range Status   Specimen Description BLOOD LEFT ARM  Final   Special Requests IN PEDIATRIC BOTTLE 3CC  Final   Culture   Final    NO GROWTH 3 DAYS Performed at [facility]    Report Status PENDING  Incomplete  Culture, blood (x 2)     Status: None (Preliminary  result)   Collection Time: 03/01/15  1:51 PM  Result Value Ref Range Status   Specimen Description BLOOD RIGHT ARM  Final   Special Requests BOTTLES DRAWN AEROBIC AND ANAEROBIC 10CC  Final   Culture  Setup Time   Final    GRAM NEGATIVE RODS IN BOTH AEROBIC AND ANAEROBIC BOTTLES CRITICAL RESULT CALLED TO, READ BACK BY AND VERIFIED WITH: C CREECH@0650  03/02/15    Culture   Final    GRAM NEGATIVE RODS REPEATING ID AND SENSITIVITIES Performed at [facility]    Report Status PENDING  Incomplete  Urine culture     Status: None   Collection Time: 03/01/15  7:23 PM  Result Value Ref Range Status   Specimen Description URINE, CATHETERIZED  Final   Special Requests NONE  Final   Culture   Final    80,000 COLONIES/ml ESCHERICHIA COLI Performed at [facility]    Report Status 03/04/2015 FINAL  Final   Organism ID, Bacteria ESCHERICHIA COLI  Final      Susceptibility   Escherichia coli - MIC*    AMPICILLIN >=32 RESISTANT Resistant     CEFAZOLIN <=4 SENSITIVE Sensitive     CEFTRIAXONE <=1 SENSITIVE Sensitive     CIPROFLOXACIN >=4 RESISTANT Resistant     GENTAMICIN <=1 SENSITIVE Sensitive     IMIPENEM <=0.25 SENSITIVE Sensitive     NITROFURANTOIN <=16 SENSITIVE Sensitive     TRIMETH/SULFA <=20 SENSITIVE Sensitive     AMPICILLIN/SULBACTAM >=32 RESISTANT  Resistant     PIP/TAZO 8 SENSITIVE Sensitive     * 80,000 COLONIES/ml ESCHERICHIA COLI  MRSA PCR Screening     Status: Abnormal   Collection Time: 03/01/15  9:34 PM  Result Value Ref Range Status   MRSA by PCR POSITIVE (A) NEGATIVE Final    Comment:        The GeneXpert MRSA Assay (FDA approved for NASAL specimens only), is one component of a comprehensive MRSA colonization surveillance program. It is not intended to diagnose MRSA infection nor to guide or monitor treatment for MRSA infections. RESULT CALLED TO, READ BACK BY AND VERIFIED WITH: M.REEVES,RN AT 2334 ON 03/02/15 BY W.SHEA     Medical History: Past  Medical History  Diagnosis Date  . MS (multiple sclerosis)   . HTN (hypertension)   . DM II (diabetes mellitus, type II), controlled   . Lymphedema   . Anemia   . Gait disorder     Medications:  Scheduled:  . baclofen  5 mg Oral TID  . Chlorhexidine Gluconate Cloth  6 each Topical Q0600  . heparin subcutaneous  5,000 Units Subcutaneous 3 times per day  . insulin aspart  0-5 Units Subcutaneous QHS  . insulin aspart  0-9 Units Subcutaneous TID WC  . midodrine  2.5 mg Oral TID WC  . mupirocin ointment  1 application Nasal BID  . pantoprazole  40 mg Oral Daily  . piperacillin-tazobactam (ZOSYN)  IV  3.375 g Intravenous Q8H  . sodium chloride  3 mL Intravenous Q12H  . vancomycin  1,000 mg Intravenous Q12H   Infusions:  . sodium chloride 10 mL/hr at 03/03/15 0645  . lactated ringers 125 mL/hr at 03/04/15 0931   PRN:   Assessment: 59 yo female with multiple sclerosis presents to the ED with lower back pain.  Assessment reveals hypothermia, hypotension, tachycardia, leukocytosis.  Pharmacy is consulted to dose vancomycin and zosyn for sepsis.  Possible sources are urinary or LE cellulitis with hx lower extremity lymphedema with intermittent weeping.  9/12 >> Vanc >> 9/12 >> Zosyn >>  9/12 >> blood x 2: GNR x 1 so far 9/12 >> urine >> sent  9/15 >> VT = __ (ordered, but lab unable to draw on time for accurate level, reordered for 2330 on 9/15)  Today, 03/04/2015: Tmax: 100.3, remains tachycardic WBC remains elevated, LA 1.5 Renal: SCr improved, CrCl~75 ml/min (CG) ~74 ml/min (normalized)  Goal of Therapy:  Vancomycin trough level 15-20 mcg/ml  Zosyn dose per renal function  Plan:  Continue Zosyn 3.375gm IV q8h (4hr extended infusions) Continue Vanc 1g IV q12h Obtain Vanc Trough at 2330 on 9/15 >> Reassess dose Monitor clinical course, cultures, and renal function  Viann Fish  03/04/2015 2:52 PM

## 2015-03-04 NOTE — Progress Notes (Signed)
Date:  Sept. 15, 2016 U.R. performed for needs and level of care. Will continue to follow for Case Management needs.  Velva Harman, RN, BSN, [place]   [phone]

## 2015-03-04 NOTE — Progress Notes (Signed)
Name: Jocelyn Sanchez MRN: [id number] DOB: 12/03/1955    ADMISSION DATE:  03/01/2015 CONSULTATION DATE:  9/13  REFERRING MD :  Algis Liming   CHIEF COMPLAINT:  Septic Shock  SIGNIFICANT EVENTS  9/12 - Admission to hospital 9/12 - Transfusion of PRBC 9/13 - Radial arterial line placed. 9/14 - Radial arterial line discontinued.  STUDIES:  Portable CXR 9/12 - no opacification. No pleural effusion. TTE 9/14 - pending  SUBJECTIVE: Still with no fever, chills, or sweats. Arterial line discontinued yesterday. No nausea or emesis. No chest pain or pressure.  ROS: Denies any headache or vision changes. Denies any dyspnea or cough.  VITAL SIGNS: Temp:  [98.7 F (37.1 C)-101.1 F (38.4 C)] 99.2 F (37.3 C) (09/15 0800) Pulse Rate:  [96-116] 109 (09/15 0800) Resp:  [8-23] 16 (09/15 0800) BP: (97-144)/(49-71) 130/71 mmHg (09/15 0800) SpO2:  [96 %-100 %] 97 % (09/15 0800) Arterial Line BP: (65-150)/(57-72) 72/69 mmHg (09/14 1900) Weight:  [81.1 kg (178 lb 12.7 oz)] 81.1 kg (178 lb 12.7 oz) (09/15 0500)  PHYSICAL EXAMINATION: General:  Awake. Alert. No acute distress. Sitting up in bed watching TV. Integument:  Warm & dry. No rash on exposed skin. Chronic skin changes of lymphedema & sacral decub. HEENT:  Moist mucus membranes. PERRL. No oral ulcers. Cardiovascular:  Regular rhythm. Tachycardic. Improving bilateral lower extremity edema.  Pulmonary:  Good aeration & clear to auscultation bilaterally. Symmetric chest wall expansion. Speaking in complete sentences. Abdomen: Soft. Normal bowel sounds. Nondistended. Nontender. Neurological:  CN 2-12 grossly in tact. No meningismus.    Recent Labs Lab 03/02/15 0522 03/03/15 0525 03/04/15 0320  NA 141 142 139  K 3.3* 2.8* 3.7  CL 115* 110 105  CO2 15* 24 25  BUN 54* 21* 13  CREATININE 0.91 0.54 0.51  GLUCOSE 92 154* 154*    Recent Labs Lab 03/02/15 0522 03/03/15 0525 03/04/15 0320  HGB 11.4* 9.7* 10.5*  HCT 34.3* 27.9*  30.0*  WBC 27.6* 30.9* 35.3*  PLT 386 339 379   No results found.   MICROBIOLOGY Blood Ctx (9/12):  1/2 GNR Urine Ctx (9/12):  E coli (sensitive to Zosyn)  ASSESSMENT / PLAN: 59 year old female with chronic lower extremity lymphedema. Septic shock has resolved. Tapering Midodrine dose. Continuing gentle IVF resuscitation. Monitoring urine output with foley catheter. Repeating cultures for bacteremia while awaiting finalization of initial blood cultures. Leukocytosis is trending upward which is concerning for possible worsening of her sepsis despite apparent clinical stability.  1. Septic shock: Shock resolved. Decrease midodrine dose to 2.5mg  tid. LA normalized. Trending leukocytosis. Awaiting results of blood cultures. E coli likely source of sepsis. Continuing vancomycin & Zosyn Day #4. Continuing IV fluid resuscitation with lactated Ringer's solution at 125 mL/hr for 24 hours.  2. Gram-negative rod bacteremia: TTE ordered. Repeat Blood cultures today. Awaiting finalization of initial culture result. Continuing vancomycin & Zosyn. 3. E coli UTI:  Continuing Zosyn Day #4. 4. Acute renal failure: Resolved. Likely due to shock. 5. Hypophosphatemia:  Replacing with PO Phos. Repeat Phos level in AM 9/16. 6. Hypomagnesemia: Replacing w/ 4gm IV Mag Sulfate. Repeat Magnesium Level AM 6/81. 7. Metabolic acidosis: Likely secondary to hyperchloremia. Resolved.  8. Hypokalemia: Resolved.  9. Chronic lymphedema with sacral decubitus ulcer: Wound care consulted.  10. Diabetes mellitus type 2: Continuing sliding scale insulin with Accu-Cheks every before meals & at bedtime. 11. Prophylaxis: Protonix Po daily, SCDs, & heparin subcutaneous every 8 hours.  12. Anemia: No signs of active bleeding. Continuing  to monitor hemoglobin daily on CBC.  13. Diet: Heart healthy carbohydrate modified diet.  Patient's hypotension has resolved but given her multiple medical problems and rising leukocytosis she warrants  close monitoring. We will continue to follow the patient in the ICU.  Sonia Baller Ashok Cordia, M.D. [facility] Pager:  [phone] After 3pm or if no response, call [phone] 03/04/2015, 9:14 AM

## 2015-03-04 NOTE — Progress Notes (Signed)
Patient refused blood cultures to be drawn,

## 2015-03-04 NOTE — Progress Notes (Signed)
  Echocardiogram 2D Echocardiogram has been performed.  Jocelyn Sanchez 03/04/2015, 12:24 PM

## 2015-03-04 NOTE — Progress Notes (Deleted)
Pt removed BIPAP and stated, "I can not wear this anymore."  Pt was fit with 6L [place] and educated as to the benefits of continuing the therapy with respect to her labs and related well being.  Pt stated again that she was not interested in wearing BIPAP any longer. RT was immediately notified of the Pt's status. [facility] was also paged for an update.

## 2015-03-05 DIAGNOSIS — N390 Urinary tract infection, site not specified: Secondary | ICD-10-CM

## 2015-03-05 DIAGNOSIS — B9620 Unspecified Escherichia coli [E. coli] as the cause of diseases classified elsewhere: Secondary | ICD-10-CM

## 2015-03-05 DIAGNOSIS — R7881 Bacteremia: Secondary | ICD-10-CM

## 2015-03-05 LAB — CBC WITH DIFFERENTIAL/PLATELET
BASOS ABS: 0 10*3/uL (ref 0.0–0.1)
BASOS PCT: 0 %
EOS ABS: 0.4 10*3/uL (ref 0.0–0.7)
Eosinophils Relative: 2 %
HCT: 30.7 % — ABNORMAL LOW (ref 36.0–46.0)
HEMOGLOBIN: 10.6 g/dL — AB (ref 12.0–15.0)
LYMPHS PCT: 11 %
Lymphs Abs: 2 10*3/uL (ref 0.7–4.0)
MCH: 28 pg (ref 26.0–34.0)
MCHC: 34.5 g/dL (ref 30.0–36.0)
MCV: 81.2 fL (ref 78.0–100.0)
MONO ABS: 1.8 10*3/uL — AB (ref 0.1–1.0)
Monocytes Relative: 10 %
NEUTROS ABS: 14.1 10*3/uL — AB (ref 1.7–7.7)
NEUTROS PCT: 77 %
PLATELETS: 348 10*3/uL (ref 150–400)
RBC: 3.78 MIL/uL — ABNORMAL LOW (ref 3.87–5.11)
RDW: 15.4 % (ref 11.5–15.5)
WBC: 18.3 10*3/uL — ABNORMAL HIGH (ref 4.0–10.5)

## 2015-03-05 LAB — RENAL FUNCTION PANEL
ALBUMIN: 1.7 g/dL — AB (ref 3.5–5.0)
ANION GAP: 9 (ref 5–15)
BUN: 8 mg/dL (ref 6–20)
CALCIUM: 7.5 mg/dL — AB (ref 8.9–10.3)
CO2: 22 mmol/L (ref 22–32)
Chloride: 105 mmol/L (ref 101–111)
Creatinine, Ser: 0.39 mg/dL — ABNORMAL LOW (ref 0.44–1.00)
GFR calc Af Amer: >60 mL/min (ref >60)
Glucose, Bld: 123 mg/dL — ABNORMAL HIGH (ref 65–99)
PHOSPHORUS: 2.3 mg/dL — AB (ref 2.5–4.6)
POTASSIUM: 4 mmol/L (ref 3.5–5.1)
SODIUM: 136 mmol/L (ref 135–145)

## 2015-03-05 LAB — CULTURE, BLOOD (ROUTINE X 2)

## 2015-03-05 LAB — GLUCOSE, CAPILLARY
GLUCOSE-CAPILLARY: 167 mg/dL — AB (ref 65–99)
Glucose-Capillary: 185 mg/dL — ABNORMAL HIGH (ref 65–99)
Glucose-Capillary: 184 mg/dL — ABNORMAL HIGH (ref 65–99)
Glucose-Capillary: 140 mg/dL — ABNORMAL HIGH (ref 65–99)

## 2015-03-05 LAB — MAGNESIUM: MAGNESIUM: 1.5 mg/dL — AB (ref 1.7–2.4)

## 2015-03-05 LAB — VANCOMYCIN, TROUGH: Vancomycin Tr: 13 ug/mL (ref 10.0–20.0)

## 2015-03-05 MED ORDER — MAGNESIUM SULFATE 4 GM/100ML IV SOLN
4.0000 g | Freq: Once | INTRAVENOUS | Status: AC
  Administered 2015-03-05: 4 g via INTRAVENOUS
  Filled 2015-03-05: qty 100

## 2015-03-05 MED ORDER — DEXTROSE 5 % IV SOLN
20.0000 mmol | Freq: Once | INTRAVENOUS | Status: AC
  Administered 2015-03-05: 20 mmol via INTRAVENOUS
  Filled 2015-03-05: qty 6.67

## 2015-03-05 MED ORDER — VANCOMYCIN HCL 10 G IV SOLR
1250.0000 mg | Freq: Two times a day (BID) | INTRAVENOUS | Status: DC
  Filled 2015-03-05: qty 1250

## 2015-03-05 MED ORDER — DEXTROSE 5 % IV SOLN
2.0000 g | INTRAVENOUS | Status: DC
  Administered 2015-03-05 – 2015-03-08 (×4): 2 g via INTRAVENOUS
  Filled 2015-03-05 (×4): qty 2

## 2015-03-05 MED ORDER — MIDODRINE HCL 2.5 MG PO TABS
2.5000 mg | ORAL_TABLET | Freq: Two times a day (BID) | ORAL | Status: DC
  Administered 2015-03-05 – 2015-03-12 (×14): 2.5 mg via ORAL
  Filled 2015-03-05 (×17): qty 1

## 2015-03-05 NOTE — Progress Notes (Signed)
Assessed patient for PICC line placement ,both arms extremely edematous and unable to rotate arms out even with assistence  Therefore unable to place a PICC line at bedside. Staff nurse to notify MD. Hoyt Koch

## 2015-03-05 NOTE — Progress Notes (Signed)
ANTIBIOTIC CONSULT NOTE - FOLLOW UP  Pharmacy Consult for Rocephin Indication: UTI, Bacteremia  Allergies  Allergen Reactions  . Sulfa Antibiotics Shortness Of Breath and Swelling    Patient Measurements: Height: 5\' 3"  (160 cm) Weight: 184 lb 4.9 oz (83.6 kg) IBW/kg (Calculated) : 52.4 Adjusted Body Weight: 64.9  Vital Signs: Temp: 99.5 F (37.5 C) (09/16 0800) Temp Source: Oral (09/16 0800) BP: 137/81 mmHg (09/16 0800) Intake/Output from previous day: 09/15 0701 - 09/16 0700 In: 4357 [P.O.:880; I.V.:2875; IV Piggyback:602] Out: 1950 [Urine:1950]  Labs:  Recent Labs  03/03/15 0525 03/04/15 0320 03/05/15 0030 03/05/15 0355  WBC 30.9* 35.3*  --  18.3*  HGB 9.7* 10.5*  --  10.6*  PLT 339 379  --  348  CREATININE 0.54 0.51 0.39*  --    Estimated Creatinine Clearance: 76.6 mL/min (by C-G formula based on Cr of 0.39).  Recent Labs  03/05/15 0030  [place] [address]     Microbiology: Recent Results (from the past 720 hour(s))  Urine culture     Status: None   Collection Time: 02/17/15  8:53 PM  Result Value Ref Range Status   Specimen Description URINE, CATHETERIZED  Final   Special Requests Normal  Final   Culture   Final    NO GROWTH 2 DAYS Performed at [facility]    Report Status 02/19/2015 FINAL  Final  Culture, blood (x 2)     Status: None (Preliminary result)   Collection Time: 03/01/15  1:51 PM  Result Value Ref Range Status   Specimen Description BLOOD LEFT ARM  Final   Special Requests IN PEDIATRIC BOTTLE 3CC  Final   Culture   Final    NO GROWTH 3 DAYS Performed at [facility]    Report Status PENDING  Incomplete  Culture, blood (x 2)     Status: None   Collection Time: 03/01/15  1:51 PM  Result Value Ref Range Status   Specimen Description BLOOD RIGHT ARM  Final   Special Requests BOTTLES DRAWN AEROBIC AND ANAEROBIC 10CC  Final   Culture  Setup Time   Final    GRAM NEGATIVE RODS IN BOTH AEROBIC AND ANAEROBIC BOTTLES CRITICAL  RESULT CALLED TO, READ BACK BY AND VERIFIED WITH: C CREECH@0650  03/02/15    Culture   Final    CITROBACTER SPECIES Performed at [facility]    Report Status 03/05/2015 FINAL  Final   Organism ID, Bacteria CITROBACTER SPECIES  Final      Susceptibility   Citrobacter species - MIC*    CEFAZOLIN >=64 RESISTANT Resistant     CEFEPIME <=1 SENSITIVE Sensitive     CEFTAZIDIME <=1 SENSITIVE Sensitive     CEFTRIAXONE <=1 SENSITIVE Sensitive     CIPROFLOXACIN <=0.25 SENSITIVE Sensitive     GENTAMICIN <=1 SENSITIVE Sensitive     IMIPENEM 2 SENSITIVE Sensitive     TRIMETH/SULFA <=20 SENSITIVE Sensitive     PIP/TAZO <=4 SENSITIVE Sensitive     * CITROBACTER SPECIES  Urine culture     Status: None   Collection Time: 03/01/15  7:23 PM  Result Value Ref Range Status   Specimen Description URINE, CATHETERIZED  Final   Special Requests NONE  Final   Culture   Final    80,000 COLONIES/ml ESCHERICHIA COLI Performed at [facility]    Report Status 03/04/2015 FINAL  Final   Organism ID, Bacteria ESCHERICHIA COLI  Final      Susceptibility   Escherichia coli -  MIC*    AMPICILLIN >=32 RESISTANT Resistant     CEFAZOLIN <=4 SENSITIVE Sensitive     CEFTRIAXONE <=1 SENSITIVE Sensitive     CIPROFLOXACIN >=4 RESISTANT Resistant     GENTAMICIN <=1 SENSITIVE Sensitive     IMIPENEM <=0.25 SENSITIVE Sensitive     NITROFURANTOIN <=16 SENSITIVE Sensitive     TRIMETH/SULFA <=20 SENSITIVE Sensitive     AMPICILLIN/SULBACTAM >=32 RESISTANT Resistant     PIP/TAZO 8 SENSITIVE Sensitive     * 80,000 COLONIES/ml ESCHERICHIA COLI  MRSA PCR Screening     Status: Abnormal   Collection Time: 03/01/15  9:34 PM  Result Value Ref Range Status   MRSA by PCR POSITIVE (A) NEGATIVE Final    Comment:        The GeneXpert MRSA Assay (FDA approved for NASAL specimens only), is one component of a comprehensive MRSA colonization surveillance program. It is not intended to diagnose MRSA infection nor to  guide or monitor treatment for MRSA infections. RESULT CALLED TO, READ BACK BY AND VERIFIED WITH: M.REEVES,RN AT 2334 ON 03/02/15 BY W.SHEA     Assessment: 59 yo female with multiple sclerosis presented (9/12) to the ED with lower back pain. Assessment revealed hypothermia, hypotension, tachycardia, leukocytosis. Pharmacy was initially consulted to dose vancomycin and zosyn for sepsis, therapy narrowed to ceftriaxone on 9/16 to cover E. coli UTI and Citrobacter bacteremia.  Micro: 9/12 Urine: E. coli  9/12 Blood: Citrobacter    Anti-infectives: 9/12 >> Vanc >> 9/16 9/12 >> Zosyn >> 9/16 9/16 >> Ceftriaxone >>   Today, 03/05/2015:  WBC 18.3 (trending down) HR 94-111 RR normal Afebrile CrCl 76.6 mL/min  Goal of Therapy:  Eradication of Infection  Plan:  --D/C Vanc and Zosyn --Start Ceftriaxone 2 g q24h --F/u repeat cultures, clinical progression  Viann Fish 03/05/2015,10:29 AM  Gretta Arab PharmD, BCPS Pager [phone] 03/05/2015 10:56 AM

## 2015-03-05 NOTE — Progress Notes (Signed)
Name: Jocelyn Sanchez MRN: [id number] DOB: [birth year]    ADMISSION DATE:  03/01/2015 CONSULTATION DATE:  9/13  REFERRING MD :  Algis Liming   CHIEF COMPLAINT:  Septic Shock  SIGNIFICANT EVENTS  9/12 - Admission to hospital 9/12 - Transfusion of PRBC 9/13 - Radial arterial line placed. 9/14 - Radial arterial line discontinued.  STUDIES:  Portable CXR 9/12 - no opacification. No pleural effusion. TTE 9/14 - EF normal. No veges, no wall motion abnormality    SUBJECTIVE:  Weaning midodrine. Refused blood cultures. Stable otherwise   ROS: Denies any headache or vision changes. Denies any dyspnea or cough.  VITAL SIGNS: Temp:  [98.2 F (36.8 C)-99.5 F (37.5 C)] 99.5 F (37.5 C) (09/16 0800) Pulse Rate:  [94-116] 101 (09/15 1600) Resp:  [15-20] 16 (09/16 0800) BP: (132-155)/(55-83) 137/81 mmHg (09/16 0800) SpO2:  [94 %-100 %] 99 % (09/16 0800) Weight:  [83.6 kg (184 lb 4.9 oz)] 83.6 kg (184 lb 4.9 oz) (09/16 0500)  PHYSICAL EXAMINATION: General:  Awake. Alert. No acute distress. Sitting up in bed watching TV. Integument:  Warm & dry. No rash on exposed skin. Chronic skin changes of lymphedema & sacral decub. + anasarca HEENT:  Moist mucus membranes. PERRL. No oral ulcers. Cardiovascular:  Regular rhythm. Tachycardic. Improving bilateral lower extremity edema.  Pulmonary:  Good aeration & clear to auscultation bilaterally. Symmetric chest wall expansion. No accessory muscle use  Speaking in complete sentences. Abdomen: Soft. Normal bowel sounds. Nondistended. Nontender. Neurological:  CN 2-12 grossly in tact. No meningismus.    Recent Labs Lab 03/03/15 0525 03/04/15 0320 03/05/15 0030  NA 142 139 136  K 2.8* 3.7 4.0  CL 110 105 105  CO2 24 25 22   BUN 21* 13 8  CREATININE 0.54 0.51 0.39*  GLUCOSE 154* 154* 123*    Recent Labs Lab 03/03/15 0525 03/04/15 0320 03/05/15 0355  HGB 9.7* 10.5* 10.6*  HCT 27.9* 30.0* 30.7*  WBC 30.9* 35.3* 18.3*  PLT 339 379 348   . No results found.   MICROBIOLOGY Blood Ctx (9/12):  Citrobacter sens rocephin  Urine Ctx (9/12):  E coli (sensitive to Zosyn)  ASSESSMENT / PLAN: .  Septic shock in setting of E coli UTI AND Citrobacter bacteremia ? From LE cellulitis??-->Shock resolved. Decreased midodrine dose to 2.5mg  tid. LA normalized.  TTE negative for obvious vege Pt refusing repeat Blood cultures.   PLAN ABX day # 5. Will switch to rocephin and d/c vanc and zosyn; then plan to complete 10 days rx.  Decrease IVF to [place] to wean Midodrine to off Risk of endocarditis low. Would prefer to repeat [facility] and see negative cultures over putting this debilitated pt thru TEE. She is refusing currently but will consider it for 9/17 Needs PICC for IV access.   Acute renal failure complicated by NAG metabolic acidosis and hyperkalemia-->Resolved. Likely due to shock. PLAN Push po intake KVO IVFs Intermittent labs  ANASARCA Plan KVO IVFs.   Hypophosphatemia: PO4 still low.  Plan Replacing,. Repeat Phos level in AM 9/17  Hypomagnesemia: Plan Replace w/ 4 gms MgSO4 Repeat in am   Chronic lymphedema with sacral decubitus ulcer Plan Wound care consulted.   Diabetes mellitus type 2: Plan  Continuing sliding scale insulin with Accu-Cheks every before meals & at bedtime.  Anemia: No signs of active bleeding. Plan Continuing to monitor hemoglobin daily on CBC.   Prophylaxis: Protonix Po daily, SCDs, & heparin subcutaneous every 8 hours.  Diet: Heart healthy carbohydrate modified  diet.  59 year old female with chronic lower extremity lymphedema. Septic shock has resolved. Tapering Midodrine dose.  Monitoring urine output with foley catheter. Ecoli UTI and Citrobacter bacteremia ? Source cellulitis??. Both sensitive to rocephin. WBC trending down. Ready to move to medsurg needs PICC for difficult IV access.    Erick Colace ACNP-BC [place] Pager # [phone] OR # [phone] if no  answer  03/05/2015, 9:44 AM

## 2015-03-05 NOTE — Progress Notes (Signed)
ANTIBIOTIC CONSULT NOTE - FOLLOW UP  Pharmacy Consult for Vancomycin Indication: rule out sepsis  Allergies  Allergen Reactions  . Sulfa Antibiotics Shortness Of Breath and Swelling    Patient Measurements: Height: 5\' 3"  (160 cm) Weight: 178 lb 12.7 oz (81.1 kg) IBW/kg (Calculated) : 52.4 Adjusted Body Weight:   Vital Signs: Temp: 99.4 F (37.4 C) (09/15 2333) Temp Source: Oral (09/15 2333) BP: 132/75 mmHg (09/16 0200) Pulse Rate: 101 (09/15 1600) Intake/Output from previous day: 09/15 0701 - 09/16 0700 In: 3817 [P.O.:880; I.V.:2335; IV Piggyback:602] Out: 1450 [Urine:1450] Intake/Output from this shift: Total I/O In: 1195 [I.V.:945; IV Piggyback:250] Out: 100 [Urine:100]  Labs:  Recent Labs  03/02/15 0522 03/03/15 0525 03/04/15 0320 03/05/15 0030  WBC 27.6* 30.9* 35.3*  --   HGB 11.4* 9.7* 10.5*  --   PLT 386 339 379  --   CREATININE 0.91 0.54 0.51 0.39*   Estimated Creatinine Clearance: 75.4 mL/min (by C-G formula based on Cr of 0.39).  Recent Labs  03/05/15 0030  [place] 59     Microbiology: Recent Results (from the past 720 hour(s))  Urine culture     Status: None   Collection Time: 02/17/15  8:53 PM  Result Value Ref Range Status   Specimen Description URINE, CATHETERIZED  Final   Special Requests Normal  Final   Culture   Final    NO GROWTH 2 DAYS Performed at [facility]    Report Status 02/19/2015 FINAL  Final  Culture, blood (x 2)     Status: None (Preliminary result)   Collection Time: 03/01/15  1:51 PM  Result Value Ref Range Status   Specimen Description BLOOD LEFT ARM  Final   Special Requests IN PEDIATRIC BOTTLE 3CC  Final   Culture   Final    NO GROWTH 3 DAYS Performed at [facility]    Report Status PENDING  Incomplete  Culture, blood (x 2)     Status: None (Preliminary result)   Collection Time: 03/01/15  1:51 PM  Result Value Ref Range Status   Specimen Description BLOOD RIGHT ARM  Final   Special  Requests BOTTLES DRAWN AEROBIC AND ANAEROBIC 10CC  Final   Culture  Setup Time   Final    GRAM NEGATIVE RODS IN BOTH AEROBIC AND ANAEROBIC BOTTLES CRITICAL RESULT CALLED TO, READ BACK BY AND VERIFIED WITH: C CREECH@0650  03/02/15    Culture   Final    GRAM NEGATIVE RODS REPEATING ID AND SENSITIVITIES Performed at [facility]    Report Status PENDING  Incomplete  Urine culture     Status: None   Collection Time: 03/01/15  7:23 PM  Result Value Ref Range Status   Specimen Description URINE, CATHETERIZED  Final   Special Requests NONE  Final   Culture   Final    80,000 COLONIES/ml ESCHERICHIA COLI Performed at [facility]    Report Status 03/04/2015 FINAL  Final   Organism ID, Bacteria ESCHERICHIA COLI  Final      Susceptibility   Escherichia coli - MIC*    AMPICILLIN >=32 RESISTANT Resistant     CEFAZOLIN <=4 SENSITIVE Sensitive     CEFTRIAXONE <=1 SENSITIVE Sensitive     CIPROFLOXACIN >=4 RESISTANT Resistant     GENTAMICIN <=1 SENSITIVE Sensitive     IMIPENEM <=0.25 SENSITIVE Sensitive     NITROFURANTOIN <=16 SENSITIVE Sensitive     TRIMETH/SULFA <=20 SENSITIVE Sensitive     AMPICILLIN/SULBACTAM >=32 RESISTANT Resistant  PIP/TAZO 8 SENSITIVE Sensitive     * 80,000 COLONIES/ml ESCHERICHIA COLI  MRSA PCR Screening     Status: Abnormal   Collection Time: 03/01/15  9:34 PM  Result Value Ref Range Status   MRSA by PCR POSITIVE (A) NEGATIVE Final    Comment:        The GeneXpert MRSA Assay (FDA approved for NASAL specimens only), is one component of a comprehensive MRSA colonization surveillance program. It is not intended to diagnose MRSA infection nor to guide or monitor treatment for MRSA infections. RESULT CALLED TO, READ BACK BY AND VERIFIED WITH: M.REEVES,RN AT 2334 ON 03/02/15 BY W.SHEA     Anti-infectives    Start     Dose/Rate Route Frequency Ordered Stop   03/05/15 1000  vancomycin (VANCOCIN) 1,250 mg in sodium chloride 0.9 % 250 mL IVPB      1,250 mg 166.7 mL/hr over 90 Minutes Intravenous Every 12 hours 03/05/15 0310     03/02/15 2000  vancomycin (VANCOCIN) 1,250 mg in sodium chloride 0.9 % 250 mL IVPB  Status:  Discontinued     1,250 mg 166.7 mL/hr over 90 Minutes Intravenous Every 24 hours 03/01/15 1728 03/01/15 2000   03/02/15 2000  vancomycin (VANCOCIN) IVPB 1000 mg/200 mL premix  Status:  Discontinued     1,000 mg 200 mL/hr over 60 Minutes Intravenous Every 24 hours 03/01/15 2000 03/02/15 1045   03/02/15 1200  vancomycin (VANCOCIN) IVPB 1000 mg/200 mL premix  Status:  Discontinued     1,000 mg 200 mL/hr over 60 Minutes Intravenous Every 12 hours 03/02/15 1144 03/05/15 0310   03/02/15 0200  piperacillin-tazobactam (ZOSYN) IVPB 3.375 g     3.375 g 12.5 mL/hr over 240 Minutes Intravenous Every 8 hours 03/01/15 1728     03/01/15 1730  vancomycin (VANCOCIN) 1,250 mg in sodium chloride 0.9 % 250 mL IVPB     1,250 mg 166.7 mL/hr over 90 Minutes Intravenous STAT 03/01/15 1727 03/02/15 1915   03/01/15 1700  piperacillin-tazobactam (ZOSYN) IVPB 3.375 g     3.375 g 100 mL/hr over 30 Minutes Intravenous  Once 03/01/15 1654 03/01/15 1931   03/01/15 1700  vancomycin (VANCOCIN) IVPB 1000 mg/200 mL premix  Status:  Discontinued     1,000 mg 200 mL/hr over 60 Minutes Intravenous  Once 03/01/15 1654 03/01/15 1727   03/01/15 1700  piperacillin-tazobactam (ZOSYN) IVPB 3.375 g  Status:  Discontinued     3.375 g 12.5 mL/hr over 240 Minutes Intravenous  Once 03/01/15 1654 03/01/15 1654      Assessment: Patient with low vancomycin level.  Prior doses charted correctly.  Goal of Therapy:  Vancomycin trough level 15-20 mcg/ml  Plan:  Measure antibiotic drug levels at steady state Follow up culture results Change vancomycin to 1250mg  iv q12hr  Nani Skillern Crowford 03/05/2015,3:11 AM

## 2015-03-05 NOTE — Progress Notes (Signed)
Occupational Therapy Treatment Patient Details Name: ARVELLA MASSINGALE MRN: [id number] DOB: Jul 28, 1955 Today's Date: 03/05/2015    History of present illness Pt was admitted for sepsis.  She presented to ED with back pain.  Pt has a PMH significant for MS,DM, HTN and chronic LE lymphedema   OT comments  Pt tolerated gentle AA/PROM and positioning for edema management.  She was able to self-feed prior to hospitalization and does not have the strength/ROM to perform at this time.  Follow Up Recommendations  No OT follow up;Supervision/Assistance - 24 hour    Equipment Recommendations  None recommended by OT    Recommendations for Other Services      Precautions / Restrictions Precautions Precaution Comments: edema bil UEs       Mobility Bed Mobility                  Transfers                      Balance                                   ADL                                         General ADL Comments: pt does not have ROM to self feed.  She usually uses R and LUE has more motion.  Educated on edema management, positioned with blanket, towel and washcloths.  Performed gentle retrograde massage and encouraged finger ROM      Vision                     Perception     Praxis      Cognition   Behavior During Therapy: WFL for tasks assessed/performed Overall Cognitive Status: Within Functional Limits for tasks assessed                       Extremity/Trunk Assessment               Exercises Other Exercises Other Exercises: LUE AA/PROM to fingers, wrist, elbow and shoulder.   Fingers approximately 1/2 flexion ROM--limited by edema; wrist approximately 30 degrees flex/ext; sup to neutral; elbow flex/ext 60 degrees in midrange; shoulder approximately 20 degrees--end range tightness Other Exercises: RUE AA/PROM fingers approximately 50% PROM, able to wiggle slightly when forearm supinated to neutral  (thumb and 2nd finger have the most ROM), wrist approximately 10 degrees in both directions; supination to neutral and elbow approximately 20 degrees of flexion with slow, gentle movement   Shoulder Instructions       General Comments      Pertinent Vitals/ Pain       Pain Assessment: Faces Faces Pain Scale: Hurts little more Pain Location: RUE  Home Living                                          Prior Functioning/Environment              Frequency Min 2X/week     Progress Toward Goals  OT Goals(current goals can now be found in the care plan section)  Progress towards OT goals: Progressing  toward goals (new goal set)  ADL Goals Additional ADL Goal #1: family will be independent with gentle AA/PROM, positioning for edema management Additional ADL Goal #2: Pt will verbalize positioning for edema management  Plan      Co-evaluation                 End of Session     Activity Tolerance No increased pain   Patient Left in bed;with call bell/phone within reach   Nurse Communication          Time: 9924-2683 OT Time Calculation (min): 23 min  Charges: OT General Charges $OT Visit: 1 Procedure OT Treatments $Therapeutic Activity: 23-37 mins  SPENCER,MARYELLEN 03/05/2015, 2:07 PM Lesle Chris, OTR/L [phone] 03/05/2015

## 2015-03-06 DIAGNOSIS — A4181 Sepsis due to Enterococcus: Secondary | ICD-10-CM

## 2015-03-06 DIAGNOSIS — E1159 Type 2 diabetes mellitus with other circulatory complications: Secondary | ICD-10-CM

## 2015-03-06 DIAGNOSIS — E1151 Type 2 diabetes mellitus with diabetic peripheral angiopathy without gangrene: Secondary | ICD-10-CM | POA: Diagnosis present

## 2015-03-06 DIAGNOSIS — N179 Acute kidney failure, unspecified: Secondary | ICD-10-CM

## 2015-03-06 DIAGNOSIS — D638 Anemia in other chronic diseases classified elsewhere: Secondary | ICD-10-CM

## 2015-03-06 DIAGNOSIS — D72829 Elevated white blood cell count, unspecified: Secondary | ICD-10-CM | POA: Diagnosis present

## 2015-03-06 DIAGNOSIS — G35 Multiple sclerosis: Secondary | ICD-10-CM

## 2015-03-06 DIAGNOSIS — B9689 Other specified bacterial agents as the cause of diseases classified elsewhere: Secondary | ICD-10-CM | POA: Diagnosis present

## 2015-03-06 DIAGNOSIS — R6521 Severe sepsis with septic shock: Secondary | ICD-10-CM

## 2015-03-06 DIAGNOSIS — L89153 Pressure ulcer of sacral region, stage 3: Secondary | ICD-10-CM | POA: Diagnosis present

## 2015-03-06 DIAGNOSIS — A4151 Sepsis due to Escherichia coli [E. coli]: Secondary | ICD-10-CM | POA: Diagnosis present

## 2015-03-06 DIAGNOSIS — R6520 Severe sepsis without septic shock: Secondary | ICD-10-CM

## 2015-03-06 DIAGNOSIS — R7881 Bacteremia: Secondary | ICD-10-CM

## 2015-03-06 DIAGNOSIS — L8992 Pressure ulcer of unspecified site, stage 2: Secondary | ICD-10-CM

## 2015-03-06 DIAGNOSIS — N390 Urinary tract infection, site not specified: Secondary | ICD-10-CM

## 2015-03-06 DIAGNOSIS — E876 Hypokalemia: Secondary | ICD-10-CM

## 2015-03-06 DIAGNOSIS — B9620 Unspecified Escherichia coli [E. coli] as the cause of diseases classified elsewhere: Secondary | ICD-10-CM | POA: Diagnosis present

## 2015-03-06 DIAGNOSIS — I890 Lymphedema, not elsewhere classified: Secondary | ICD-10-CM

## 2015-03-06 DIAGNOSIS — A419 Sepsis, unspecified organism: Secondary | ICD-10-CM | POA: Diagnosis present

## 2015-03-06 LAB — CULTURE, BLOOD (ROUTINE X 2): Culture: NO GROWTH

## 2015-03-06 LAB — GLUCOSE, CAPILLARY
GLUCOSE-CAPILLARY: 139 mg/dL — AB (ref 65–99)
GLUCOSE-CAPILLARY: 132 mg/dL — AB (ref 65–99)
GLUCOSE-CAPILLARY: 152 mg/dL — AB (ref 65–99)
Glucose-Capillary: 165 mg/dL — ABNORMAL HIGH (ref 65–99)

## 2015-03-06 NOTE — Progress Notes (Signed)
ANTIBIOTIC CONSULT NOTE - FOLLOW UP  Pharmacy Consult for Rocephin Indication: UTI, Bacteremia  Allergies  Allergen Reactions  . Sulfa Antibiotics Shortness Of Breath and Swelling    Patient Measurements: Height: 5\' 3"  (160 cm) Weight: 184 lb 4.9 oz (83.6 kg) IBW/kg (Calculated) : 52.4 Adjusted Body Weight: 64.9  Vital Signs: Temp: 100.9 F (38.3 C) (09/17 0916) Temp Source: Oral (09/17 0916) BP: 125/69 mmHg (09/17 0645) Pulse Rate: 109 (09/17 0645) Intake/Output from previous day: 09/16 0701 - 09/17 0700 In: 836.7 [P.O.:240; I.V.:290; IV Piggyback:306.7] Out: 1200 [Urine:1200]  Labs:  Recent Labs  03/04/15 0320 03/05/15 0030 03/05/15 0355  WBC 35.3*  --  18.3*  HGB 10.5*  --  10.6*  PLT 379  --  348  CREATININE 0.51 0.39*  --    Estimated Creatinine Clearance: 76.6 mL/min (by C-G formula based on Cr of 0.39).  Recent Labs  03/05/15 0030  [place] 73     Microbiology: Recent Results (from the past 720 hour(s))  Urine culture     Status: None   Collection Time: 02/17/15  8:53 PM  Result Value Ref Range Status   Specimen Description URINE, CATHETERIZED  Final   Special Requests Normal  Final   Culture   Final    NO GROWTH 2 DAYS Performed at [facility]    Report Status 02/19/2015 FINAL  Final  Culture, blood (x 2)     Status: None (Preliminary result)   Collection Time: 03/01/15  1:51 PM  Result Value Ref Range Status   Specimen Description BLOOD LEFT ARM  Final   Special Requests IN PEDIATRIC BOTTLE 3CC  Final   Culture   Final    NO GROWTH 4 DAYS Performed at [facility]    Report Status PENDING  Incomplete  Culture, blood (x 2)     Status: None   Collection Time: 03/01/15  1:51 PM  Result Value Ref Range Status   Specimen Description BLOOD RIGHT ARM  Final   Special Requests BOTTLES DRAWN AEROBIC AND ANAEROBIC 10CC  Final   Culture  Setup Time   Final    GRAM NEGATIVE RODS IN BOTH AEROBIC AND ANAEROBIC BOTTLES CRITICAL  RESULT CALLED TO, READ BACK BY AND VERIFIED WITH: C CREECH@0650  03/02/15    Culture   Final    CITROBACTER SPECIES Performed at [facility]    Report Status 03/05/2015 FINAL  Final   Organism ID, Bacteria CITROBACTER SPECIES  Final      Susceptibility   Citrobacter species - MIC*    CEFAZOLIN >=64 RESISTANT Resistant     CEFEPIME <=1 SENSITIVE Sensitive     CEFTAZIDIME <=1 SENSITIVE Sensitive     CEFTRIAXONE <=1 SENSITIVE Sensitive     CIPROFLOXACIN <=0.25 SENSITIVE Sensitive     GENTAMICIN <=1 SENSITIVE Sensitive     IMIPENEM 2 SENSITIVE Sensitive     TRIMETH/SULFA <=20 SENSITIVE Sensitive     PIP/TAZO <=4 SENSITIVE Sensitive     * CITROBACTER SPECIES  Urine culture     Status: None   Collection Time: 03/01/15  7:23 PM  Result Value Ref Range Status   Specimen Description URINE, CATHETERIZED  Final   Special Requests NONE  Final   Culture   Final    80,000 COLONIES/ml ESCHERICHIA COLI Performed at [facility]    Report Status 03/04/2015 FINAL  Final   Organism ID, Bacteria ESCHERICHIA COLI  Final      Susceptibility   Escherichia coli -  MIC*    AMPICILLIN >=32 RESISTANT Resistant     CEFAZOLIN <=4 SENSITIVE Sensitive     CEFTRIAXONE <=1 SENSITIVE Sensitive     CIPROFLOXACIN >=4 RESISTANT Resistant     GENTAMICIN <=1 SENSITIVE Sensitive     IMIPENEM <=0.25 SENSITIVE Sensitive     NITROFURANTOIN <=16 SENSITIVE Sensitive     TRIMETH/SULFA <=20 SENSITIVE Sensitive     AMPICILLIN/SULBACTAM >=32 RESISTANT Resistant     PIP/TAZO 8 SENSITIVE Sensitive     * 80,000 COLONIES/ml ESCHERICHIA COLI  MRSA PCR Screening     Status: Abnormal   Collection Time: 03/01/15  9:34 PM  Result Value Ref Range Status   MRSA by PCR POSITIVE (A) NEGATIVE Final    Comment:        The GeneXpert MRSA Assay (FDA approved for NASAL specimens only), is one component of a comprehensive MRSA colonization surveillance program. It is not intended to diagnose MRSA infection nor to  guide or monitor treatment for MRSA infections. RESULT CALLED TO, READ BACK BY AND VERIFIED WITH: M.REEVES,RN AT 2334 ON 03/02/15 BY W.SHEA     Assessment: 59 yo female with multiple sclerosis presented (9/12) to the ED with lower back pain. Assessment revealed hypothermia, hypotension, tachycardia, leukocytosis. Pharmacy was initially consulted to dose vancomycin and zosyn for sepsis, therapy narrowed to ceftriaxone on 9/16 to cover E. coli UTI and Citrobacter bacteremia.  Micro: 9/12 Urine: E. coli  9/12 Blood: Citrobacter    Anti-infectives: 9/12 >> Vanc >> 9/16 9/12 >> Zosyn >> 9/16 9/16 >> Ceftriaxone >>   Goal of Therapy:  Eradication of Infection  Plan:  -- Day 2 Ceftriaxone 2 g q24h -- See no repeat blood cultures, appear to have been canceled -- Pharmacy will sign off  Adrian Saran, PharmD, BCPS Pager [phone] 03/06/2015 10:23 AM

## 2015-03-06 NOTE — Progress Notes (Addendum)
Patient ID: Jocelyn Sanchez, female   DOB: [birth year], 59 y.o.   MRN: [id number] [facility] PROGRESS NOTE  KORRINA ZERN URK:[id number] DOB: Oct 18, 1955 DOA: 03/01/2015 PCP: [name], MD  Brief narrative:    59 y.o. female with past medical history of multiple sclerosis, diabetes mellitus type 2, hypertension, lower extremity lymphedema who presented to [facility] ED 03/01/15 with initial concern for low back pain. She was subsequently founds to have citrobacter bacteremia and E.Coli UTI and has developed a septic shock. She was on PCCM service through 9/16 and transferred to [facility] 9/17.  SIGNIFICANT EVENTS  9/12 - Admission to hospital 9/12 - Transfusion of PRBC 9/13 - Radial arterial line placed. 9/14 - Radial arterial line discontinued. 9/17 - Care transitioned to [facility]  Anticipated discharge: likely by 9/19 if no fevers and leukocytosis resolves.  Assessment/Plan:    Principal Problem: Septic shock secondary to E coli UTI and Citrobacter bacteremia  - Blood cultures collected on admission grew citrobacter species. Pt refused repeat blood cultures. Order placed for PICC line insertion so we can repeat blood cultures. - Urine culture grew E.Coli - She is currently on Rocephin but was on vanco and zosyn (for 5 days). Rocephin to continue for total of 10 days. Started 9/16. - Of concern is that she still is spiking a fever and though herr WBC count improved in past 24 hours (35 --> 18) it is still high - Hemodynamically stable and does not require pressor support other than midodrine which we are slowly weaning in hopes she does not need to continue this on discharge.  - TTE was negative for vegetations    Active Problems: Acute renal failure complicated by non anion gap metabolic acidosis / Hyperkalemia / Hypokalemia  - Secondary to septic shock - Creatinine WNL - Continue to monitor electrolyes, they have improved but unable to get further blood draw  Hypophosphatemia /  Hypomagnesemia - Supplemented - Check magnesium and phosphorous in am  Chronic lymphedema with sacral decubitus ulcer, stage 2 - Wound care consulted 03/02/2015 - Pt has long-standing lymphedema and recurrence of pressure injury over area that had been previously healed. Pt also has indwelling urinary catheter and is incontinent of stool. - Wound type: moisture associated skin damage plus pressure and friction in an area of previous ulceration. Measurement:1.5cm x 2cm x 0.2cm Stage 2 in an area measuring 6cm x 8cm of scar tissue  - Dressing procedure/placement/frequency: A soft silicone foam dressing is provided; bilateral pressure redistribution heel boots provided to prevent injury in the presence of the weight and immobility resulting from the lymphedema.   Diabetes mellitus type 2 with peripheral vascular manifestations - A1c in 11/2014 was 6.7 indicating good glycemic control - Continue SSI  Anemia of chronic disease  - Likely due to history of multiple sclerosis - Hemoglobin stable at 10.6 - No current indications for transfusion - S/p 2 units of PRBCs on 9/12.  Multiple sclerosis - With quadriparesis.  - Not on treatment and does not follow with neurology.   Acute low back pain - Unclear etiology. - Lumbar spine x-ray without acute findings. Controlled.    DVT Prophylaxis  - Heparin subQ ordered    Code Status: Full.  Family Communication:  plan of care discussed with the patient Disposition Plan: Home by 03/08/2015.  IV access:  Peripheral IV  Procedures and diagnostic studies:    No results found.  Medical Consultants:  Cardiology PCCM consulted and then primary through 9/16  Other Consultants:  PT evaluation Nutrition WOC  IAnti-Infectives:   Vanco and zosyn stopped 9/16 Rocephin 03/05/2015 -->     DEVINE, ALMA, MD  [facility] Pager [phone]  Time spent in minutes: 25 minutes  If 7PM-7AM, please contact  night-coverage [email or web address] Password [email or web address] 03/06/2015, 5:22 PM   LOS: 5 days    HPI/Subjective: No acute overnight events. Patient reports feeling better.  Objective: Filed Vitals:   03/05/15 2152 03/06/15 0645 03/06/15 0916 03/06/15 1409  BP: 114/66 125/69  118/61  Pulse: 105 109  101  Temp: 100.3 F (37.9 C) 102.5 F (39.2 C) 100.9 F (38.3 C) 100.2 F (37.9 C)  TempSrc: Oral Oral Oral Oral  Resp: 16 16  16   Height:      Weight:      SpO2: 98% 96%  100%    Intake/Output Summary (Last 24 hours) at 03/06/15 1722 Last data filed at 03/06/15 0845  Gross per 24 hour  Intake    240 ml  Output    550 ml  Net   -310 ml    Exam:   General:  Pt is alert, follows commands appropriately, not in acute distress  Cardiovascular: Regular rate and rhythm, S1/S2 (+)  Respiratory: Clear to auscultation bilaterally, no wheezing, no crackles, no rhonchi  Abdomen: Soft, non tender, non distended, bowel sounds present  Extremities: lymphedema, palpable bilaterally, sacral ulcer   Neuro: Grossly nonfocal  Data Reviewed: Basic Metabolic Panel:  Recent Labs Lab 03/01/15 1326 03/02/15 0522 03/03/15 0525 03/04/15 0320 03/05/15 0030  NA 134* 141 142 139 136  K 4.3 3.3* 2.8* 3.7 4.0  CL 103 115* 110 105 105  CO2 17* 15* 24 25 22   GLUCOSE 194* 92 154* 154* 123*  BUN 94* 54* 21* 13 8  CREATININE 1.81* 0.91 0.54 0.51 0.39*  CALCIUM 9.3 8.2* 7.6* 7.8* 7.5*  MG  --   --   --  1.3* 1.5*  PHOS  --   --   --  1.1* 2.3*   Liver Function Tests:  Recent Labs Lab 03/01/15 1326 03/02/15 0522 03/03/15 0525 03/04/15 0320 03/05/15 0030  AST 15 15 15   --   --   ALT 10* 9* 8*  --   --   ALKPHOS 68 58 73  --   --   BILITOT 0.6 0.9 0.6  --   --   PROT 7.8 6.3* 5.3*  --   --   ALBUMIN 3.0* 2.5* 1.8* 1.9* 1.7*   No results for input(s): LIPASE, AMYLASE in the last 168 hours. No results for input(s): AMMONIA in the last 168 hours. CBC:  Recent Labs Lab 03/01/15 1326  03/02/15 0522 03/03/15 0525 03/04/15 0320 03/05/15 0355  WBC 28.7* 27.6* 30.9* 35.3* 18.3*  NEUTROABS 27.2*  --   --  31.7* 14.1*  HGB 6.8* 11.4* 9.7* 10.5* 10.6*  HCT 20.5* 34.3* 27.9* 30.0* 30.7*  MCV 81.0 83.7 80.4 80.9 81.2  PLT 646* 386 339 379 348   Cardiac Enzymes: No results for input(s): CKTOTAL, CKMB, CKMBINDEX, TROPONINI in the last 168 hours. BNP: Invalid input(s): POCBNP CBG:  Recent Labs Lab 03/05/15 1600 03/05/15 2226 03/06/15 0724 03/06/15 1159 03/06/15 1700  GLUCAP 184* 140* 139* 165* 132*    Culture, blood (x 2)     Status: None   Collection Time: 03/01/15  1:51 PM  Result Value Ref Range Status   Specimen Description BLOOD LEFT ARM  Final   Special Requests IN PEDIATRIC BOTTLE 3CC  Final  Culture   Final    NO GROWTH 5 DAYS Performed at [facility]    Report Status 03/06/2015 FINAL  Final  Culture, blood (x 2)     Status: None   Collection Time: 03/01/15  1:51 PM  Result Value Ref Range Status   Specimen Description BLOOD RIGHT ARM  Final   Special Requests BOTTLES DRAWN AEROBIC AND ANAEROBIC 10CC  Final   Culture  Setup Time   Final   Culture   Final   Report Status 03/05/2015 FINAL  Final   Organism ID, Bacteria CITROBACTER SPECIES  Final      Susceptibility   Citrobacter species - MIC*    CEFAZOLIN >=64 RESISTANT Resistant     CEFEPIME <=1 SENSITIVE Sensitive     CEFTAZIDIME <=1 SENSITIVE Sensitive     CEFTRIAXONE <=1 SENSITIVE Sensitive     CIPROFLOXACIN <=0.25 SENSITIVE Sensitive     GENTAMICIN <=1 SENSITIVE Sensitive     IMIPENEM 2 SENSITIVE Sensitive     TRIMETH/SULFA <=20 SENSITIVE Sensitive     PIP/TAZO <=4 SENSITIVE Sensitive     * CITROBACTER SPECIES  Urine culture     Status: None   Collection Time: 03/01/15  7:23 PM  Result Value Ref Range Status   Specimen Description URINE, CATHETERIZED  Final   Special Requests NONE  Final   Culture   Final   Report Status 03/04/2015 FINAL  Final   Organism ID, Bacteria  ESCHERICHIA COLI  Final      Susceptibility   Escherichia coli - MIC*    AMPICILLIN >=32 RESISTANT Resistant     CEFAZOLIN <=4 SENSITIVE Sensitive     CEFTRIAXONE <=1 SENSITIVE Sensitive     CIPROFLOXACIN >=4 RESISTANT Resistant     GENTAMICIN <=1 SENSITIVE Sensitive     IMIPENEM <=0.25 SENSITIVE Sensitive     NITROFURANTOIN <=16 SENSITIVE Sensitive     TRIMETH/SULFA <=20 SENSITIVE Sensitive     AMPICILLIN/SULBACTAM >=32 RESISTANT Resistant     PIP/TAZO 8 SENSITIVE Sensitive     * 80,000 COLONIES/ml ESCHERICHIA COLI  MRSA PCR Screening     Status: Abnormal   Collection Time: 03/01/15  9:34 PM  Result Value Ref Range Status   MRSA by PCR POSITIVE (A) NEGATIVE Final     Scheduled Meds: . baclofen  5 mg Oral TID  . cefTRIAXone  2 g Intravenous Q24H  . heparin subcutaneous  5,000 Units Subcutaneous 3 times per day  . insulin aspart  0-5 Units Subcutaneous QHS  . insulin aspart  0-9 Units Subcutaneous TID WC  . midodrine  2.5 mg Oral BID WC  . pantoprazole  40 mg Oral Daily

## 2015-03-07 ENCOUNTER — Inpatient Hospital Stay (HOSPITAL_COMMUNITY): Payer: Medicare Other

## 2015-03-07 DIAGNOSIS — D72829 Elevated white blood cell count, unspecified: Secondary | ICD-10-CM

## 2015-03-07 DIAGNOSIS — E1151 Type 2 diabetes mellitus with diabetic peripheral angiopathy without gangrene: Secondary | ICD-10-CM

## 2015-03-07 LAB — GLUCOSE, CAPILLARY
GLUCOSE-CAPILLARY: 139 mg/dL — AB (ref 65–99)
GLUCOSE-CAPILLARY: 163 mg/dL — AB (ref 65–99)
GLUCOSE-CAPILLARY: 128 mg/dL — AB (ref 65–99)
Glucose-Capillary: 144 mg/dL — ABNORMAL HIGH (ref 65–99)

## 2015-03-07 MED ORDER — IOHEXOL 300 MG/ML  SOLN
100.0000 mL | Freq: Once | INTRAMUSCULAR | Status: AC | PRN
  Administered 2015-03-07: 100 mL via INTRAVENOUS

## 2015-03-07 MED ORDER — IOHEXOL 300 MG/ML  SOLN
50.0000 mL | Freq: Once | INTRAMUSCULAR | Status: AC | PRN
  Administered 2015-03-07: 50 mL via ORAL

## 2015-03-07 NOTE — Progress Notes (Signed)
Jocelyn Sanchez continue to have a temp of 102. During the night.  Pt refused lab drawn for blood culture, IV team unable to put in PICC  Line, Tylenol given for fever, on call MD made aware, and on coming nurse made aware,

## 2015-03-07 NOTE — Progress Notes (Addendum)
Patient ID: Jocelyn Sanchez, female   DOB: 14-Jun-1956, 59 y.o.   MRN: [id number] [facility] PROGRESS NOTE  SHANISHA LECH WUJ:[id number] DOB: 09/03/1955 DOA: 03/01/2015 PCP: [name], MD  Brief narrative:    59 y.o. female with past medical history of multiple sclerosis, diabetes mellitus type 2, hypertension, lower extremity lymphedema who presented to [facility] ED 03/01/15 with initial concern for low back pain. She was subsequently founds to have citrobacter bacteremia and E.Coli UTI and has developed a septic shock. She was on PCCM service through 9/16 and transferred to [facility] 9/17.  SIGNIFICANT EVENTS  9/12 - Admission to hospital 9/12 - Transfusion of PRBC 9/13 - Radial arterial line placed. 9/14 - Radial arterial line discontinued. 9/17 - Care transitioned to [facility] 9/18 - Spiking fevers. CT abd and chest ordered for eval of fever.  Anticipated discharge: Ongoing fevers. Obtain CT abd and chest as well as CXR to further evaluate for ongoing fevers. Has refused blood draws. PICC cant be done due to lymphedema.  Assessment/Plan:    Principal Problem: Septic shock secondary to E coli UTI and Citrobacter bacteremia  - Blood cultures on admission positive for citrobacter species. Urine culture grew E.Coli. TTE negative for vegetations.  - Due to significant lymphedema we cannot obtain repeat blood cultures - PICC cant be inserted because of lymphedema so will see with CCM if they can help with IJ line - Continue Rocephin for now. Please refer to abx section below for other abx pt took during this hospital stay.  - Due to ongoing fevers will get CXR and CT abd and chest for further evaluation.   Active Problems: Acute renal failure complicated by non anion gap metabolic acidosis / Hyperkalemia / Hypokalemia  - Secondary to septic shock -- Creatinine improved with resolution of sepsis - Electrolyte imbalance corrected but unable to recheck the progress since pt refuse further blood  draws until we can get IJ line   Hypophosphatemia / Hypomagnesemia - Supplemented - Refused blood work this am   Chronic lymphedema with sacral decubitus ulcer, stage 2 - Wound care consulted 03/02/2015, appreciate their assessment  - Patient with long-standing lymphedema and recurrence of pressure injury over area that had been previously healed. Pt also has indwelling urinary catheter and is incontinent of stool. - Per wound care, wound type: moisture associated skin damage plus pressure and friction in an area of previous ulceration. Measurement:1.5cm x 2cm x 0.2cm Stage 2 in an area measuring 6cm x 8cm of scar tissue  - Dressing recommendations: A soft silicone foam dressing is provided; bilateral pressure redistribution heel boots provided to prevent injury in the presence of the weight and immobility resulting from the lymphedema.   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   Acute low back pain. - Lumbar spine x-ray without acute findings.  - Improved since admission.    DVT Prophylaxis  - Heparin subQ ordered while pt in hospital    Code Status: Full.  Family Communication:  plan of care discussed with the patient Disposition Plan: still spiking fever, plan for IJ line placement tomorrow. May need ID consult for ongoing  fevers.   IV access:  Peripheral IV  Procedures and diagnostic studies:    Dg Chest Port 1 View Mar 14, 2015    Interval development of bilateral pleural effusions and bilateral lower lobes airspace consolidation versus atelectasis. This represents an acute change from the previous radiograph dated 03/01/2015    Electronically Signed   By: Fidela Salisbury M.D.   On: 14-Mar-2015 10:35  Dg Lumbar Spine 2-3 Views 03/01/2015  1. Considerable lumbar spondylosis, scoliosis, and degenerative disc disease with mild subluxations at L3-4 and L4-5. The scoliotic curvature oblique and indistinct on the lateral projection. I do not see a definite fracture. MRI (or CT) would be a superior way to assess the lumbar spine for impingement given the limitations due to the degree of scoliosis.   Electronically Signed   By: Van Clines M.D.   On: 03/01/2015 18:39   Dg Chest Portable 1 View 03/01/2015   No acute cardiopulmonary disease.   Electronically Signed   By: Lajean Manes M.D.   On: 03/01/2015 14:49    Medical Consultants:  Cardiology PCCM consulted and then primary through 9/16  Other Consultants:  PT evaluation Nutrition WOC  IAnti-Infectives:   Vanco and zosyn stopped 9/16 Rocephin 03/05/2015 -->     Leisa Lenz, MD  [facility] Pager [phone]  Time spent in minutes: 25 minutes  If 7PM-7AM, please contact night-coverage [email or web address] Password [email or web address] Mar 14, 2015, 1:32 PM   LOS: 6 days    HPI/Subjective: No acute overnight events. Patient sleeping this am..  Objective: Filed Vitals:   03/06/15 1409 03/06/15 2135 March 14, 2015 0357 2015/03/14 0515  BP: 118/61 119/62 116/56 125/45  Pulse: 101 94 101 102  Temp: 100.2 F (37.9 C) 102.9 F (39.4 C) 102.9 F (39.4 C) 102.9 F (39.4 C)  TempSrc: Oral Oral Oral Oral  Resp: 16 17 18 19   Height:      Weight:    87.7 kg (193 lb 5.5 oz)  SpO2: 100% 96% 98% 99%    Intake/Output Summary (Last 24 hours) at 03/14/15 1332 Last data filed at 2015/03/14 0516  Gross per 24 hour  Intake     60 ml  Output    325 ml  Net   -265 ml    Exam:   General:  Pt is sleeping, no distress  Cardiovascular: tachycardic, appreciate S1, S2   Respiratory: bilateral air entry, no wheezing   Abdomen: appreciate bowel sounds, non tender abdomen, non  distention   Extremities: lymphedema appreciated, palpable pulses   Neuro: No focal deficits   Data Reviewed: Basic Metabolic Panel:  Recent Labs Lab 03/01/15 1326 03/02/15 0522 03/03/15 0525 03/04/15 0320 03/05/15 0030  NA 134* 141 142 139 136  K 4.3 3.3* 2.8* 3.7 4.0  CL 103 115* 110 105 105  CO2 17* 15* 24 25 22   GLUCOSE 194* 92 154* 154* 123*  BUN 94* 54* 21* 13 8  CREATININE 1.81* 0.91 0.54 0.51 0.39*  CALCIUM 9.3 8.2* 7.6* 7.8* 7.5*  MG  --   --   --  1.3* 1.5*  PHOS  --   --   --  1.1* 2.3*   Liver Function Tests:  Recent Labs Lab 03/01/15 1326 03/02/15 0522 03/03/15 0525 03/04/15 0320 03/05/15 0030  AST 15 15 15   --   --   ALT 10* 9* 8*  --   --   ALKPHOS 68 58 73  --   --   BILITOT 0.6 0.9 0.6  --   --  PROT 7.8 6.3* 5.3*  --   --   ALBUMIN 3.0* 2.5* 1.8* 1.9* 1.7*   No results for input(s): LIPASE, AMYLASE in the last 168 hours. No results for input(s): AMMONIA in the last 168 hours. CBC:  Recent Labs Lab 03/01/15 1326 03/02/15 0522 03/03/15 0525 03/04/15 0320 03/05/15 0355  WBC 28.7* 27.6* 30.9* 35.3* 18.3*  NEUTROABS 27.2*  --   --  31.7* 14.1*  HGB 6.8* 11.4* 9.7* 10.5* 10.6*  HCT 20.5* 34.3* 27.9* 30.0* 30.7*  MCV 81.0 83.7 80.4 80.9 81.2  PLT 646* 386 339 379 348   Cardiac Enzymes: No results for input(s): CKTOTAL, CKMB, CKMBINDEX, TROPONINI in the last 168 hours. BNP: Invalid input(s): POCBNP CBG:  Recent Labs Lab 03/06/15 1159 03/06/15 1700 03/06/15 2217 03/07/15 0752 03/07/15 1226  GLUCAP 165* 132* 152* 144* 139*    Culture, blood (x 2)     Status: None   Collection Time: 03/01/15  1:51 PM  Result Value Ref Range Status   Specimen Description BLOOD LEFT ARM  Final   Special Requests IN PEDIATRIC BOTTLE 3CC  Final   Culture   Final    NO GROWTH 5 DAYS Performed at [facility]    Report Status 03/06/2015 FINAL  Final  Culture, blood (x 2)     Status: None   Collection Time: 03/01/15  1:51 PM  Result  Value Ref Range Status   Specimen Description BLOOD RIGHT ARM  Final   Special Requests BOTTLES DRAWN AEROBIC AND ANAEROBIC 10CC  Final   Culture  Setup Time   Final   Culture   Final   Report Status 03/05/2015 FINAL  Final   Organism ID, Bacteria CITROBACTER SPECIES  Final      Susceptibility   Citrobacter species - MIC*    CEFAZOLIN >=64 RESISTANT Resistant     CEFEPIME <=1 SENSITIVE Sensitive     CEFTAZIDIME <=1 SENSITIVE Sensitive     CEFTRIAXONE <=1 SENSITIVE Sensitive     CIPROFLOXACIN <=0.25 SENSITIVE Sensitive     GENTAMICIN <=1 SENSITIVE Sensitive     IMIPENEM 2 SENSITIVE Sensitive     TRIMETH/SULFA <=20 SENSITIVE Sensitive     PIP/TAZO <=4 SENSITIVE Sensitive     * CITROBACTER SPECIES  Urine culture     Status: None   Collection Time: 03/01/15  7:23 PM  Result Value Ref Range Status   Specimen Description URINE, CATHETERIZED  Final   Special Requests NONE  Final   Culture   Final   Report Status 03/04/2015 FINAL  Final   Organism ID, Bacteria ESCHERICHIA COLI  Final      Susceptibility   Escherichia coli - MIC*    AMPICILLIN >=32 RESISTANT Resistant     CEFAZOLIN <=4 SENSITIVE Sensitive     CEFTRIAXONE <=1 SENSITIVE Sensitive     CIPROFLOXACIN >=4 RESISTANT Resistant     GENTAMICIN <=1 SENSITIVE Sensitive     IMIPENEM <=0.25 SENSITIVE Sensitive     NITROFURANTOIN <=16 SENSITIVE Sensitive     TRIMETH/SULFA <=20 SENSITIVE Sensitive     AMPICILLIN/SULBACTAM >=32 RESISTANT Resistant     PIP/TAZO 8 SENSITIVE Sensitive     * 80,000 COLONIES/ml ESCHERICHIA COLI  MRSA PCR Screening     Status: Abnormal   Collection Time: 03/01/15  9:34 PM  Result Value Ref Range Status   MRSA by PCR POSITIVE (A) NEGATIVE Final     Scheduled Meds: . baclofen  5 mg Oral TID  . cefTRIAXone  2 g Intravenous  Q24H  . heparin subcutaneous  5,000 Units Subcutaneous 3 times per day  . insulin aspart  0-5 Units Subcutaneous QHS  . insulin aspart  0-9 Units Subcutaneous TID WC  . midodrine   2.5 mg Oral BID WC  . pantoprazole  40 mg Oral Daily

## 2015-03-07 NOTE — Progress Notes (Signed)
PT Cancellation Note  Patient Details Name: LUNDEN STIEBER MRN: [id number] DOB: 1955-10-14   Cancelled Treatment:    Reason Eval/Treat Not Completed: PT screened, no needs identified, will sign off (Pt was evaluated by PT on 03/03/15, pt at baseline with mobility, uses HOyer lift for OOB. No PT indicated at this time. Will sign off. )   [name] 03/07/2015, 10:43 AM [phone]

## 2015-03-08 DIAGNOSIS — A498 Other bacterial infections of unspecified site: Secondary | ICD-10-CM | POA: Diagnosis present

## 2015-03-08 DIAGNOSIS — B9689 Other specified bacterial agents as the cause of diseases classified elsewhere: Secondary | ICD-10-CM

## 2015-03-08 DIAGNOSIS — R197 Diarrhea, unspecified: Secondary | ICD-10-CM

## 2015-03-08 DIAGNOSIS — R509 Fever, unspecified: Secondary | ICD-10-CM | POA: Diagnosis present

## 2015-03-08 LAB — GLUCOSE, CAPILLARY
GLUCOSE-CAPILLARY: 96 mg/dL (ref 65–99)
GLUCOSE-CAPILLARY: 108 mg/dL — AB (ref 65–99)
GLUCOSE-CAPILLARY: 118 mg/dL — AB (ref 65–99)
GLUCOSE-CAPILLARY: 133 mg/dL — AB (ref 65–99)

## 2015-03-08 MED ORDER — METRONIDAZOLE 500 MG PO TABS
500.0000 mg | ORAL_TABLET | Freq: Three times a day (TID) | ORAL | Status: DC
  Administered 2015-03-08 – 2015-03-11 (×9): 500 mg via ORAL
  Filled 2015-03-08 (×13): qty 1

## 2015-03-08 MED ORDER — DEXTROSE 5 % IV SOLN
2.0000 g | Freq: Three times a day (TID) | INTRAVENOUS | Status: DC
  Administered 2015-03-08 – 2015-03-11 (×8): 2 g via INTRAVENOUS
  Filled 2015-03-08 (×10): qty 2

## 2015-03-08 MED ORDER — PRO-STAT SUGAR FREE PO LIQD
30.0000 mL | Freq: Two times a day (BID) | ORAL | Status: DC
  Administered 2015-03-08 – 2015-03-11 (×3): 30 mL via ORAL
  Filled 2015-03-08 (×10): qty 30

## 2015-03-08 NOTE — Consult Note (Signed)
[name] Reason for Consult: Needs a flexible sigmoidoscopy for an abnormal CT scan. Referring Physician: Dr. Markus Daft is an 59 y.o. female.  HPI: 59 year old black female, with multiple medical problems listed below, admitted with low back pain, has been having fevers of unknown origin. She developed some diarrhea recently and stool studies are pending. She usually has 1 BM per day with no history of melena or hematochezia. She has a good appetite and her weight has been stable. She is wheelchair bound and lives with her mother. She has never seen a gastroenterologist and has never had a colonoscopy. She denies having any GI issues besides the diarrhea.CT scan of the abdomen and pelvis revealed rectal wall thickening ?proctitis.     Past Medical History  Diagnosis Date  . MS (multiple sclerosis)   . HTN (hypertension)   . DM II (diabetes mellitus, type II), controlled   . Lymphedema   . Anemia   . Gait disorder    History reviewed. No pertinent past surgical history.  Family History  Problem Relation Age of Onset  . Multiple sclerosis Mother    Social History:  reports that she has quit smoking. Her smoking use included Cigarettes. She has never used smokeless tobacco. She reports that she does not drink alcohol or use illicit drugs.  Allergies:  Allergies  Allergen Reactions  . Sulfa Antibiotics Shortness Of Breath and Swelling   Medications: I have reviewed the patient's current medications.  Results for orders placed or performed during the hospital encounter of 03/01/15 (from the past 48 hour(s))  Glucose, capillary     Status: Abnormal   Collection Time: 03/06/15 10:17 PM  Result Value Ref Range   Glucose-Capillary 152 (H) 65 - 99 mg/dL  Glucose, capillary     Status: Abnormal   Collection Time: 03/07/15  7:52 AM  Result Value Ref Range   Glucose-Capillary 144 (H) 65 - 99 mg/dL   Comment 1 Notify RN   Glucose, capillary     Status: Abnormal    Collection Time: 03/07/15 12:26 PM  Result Value Ref Range   Glucose-Capillary 139 (H) 65 - 99 mg/dL   Comment 1 Notify RN   Glucose, capillary     Status: Abnormal   Collection Time: 03/07/15  4:16 PM  Result Value Ref Range   Glucose-Capillary 163 (H) 65 - 99 mg/dL   Comment 1 Notify RN   Glucose, capillary     Status: Abnormal   Collection Time: 03/07/15  9:58 PM  Result Value Ref Range   Glucose-Capillary 128 (H) 65 - 99 mg/dL  Glucose, capillary     Status: None   Collection Time: 03/08/15  7:39 AM  Result Value Ref Range   Glucose-Capillary 96 65 - 99 mg/dL  Glucose, capillary     Status: Abnormal   Collection Time: 03/08/15 12:02 PM  Result Value Ref Range   Glucose-Capillary 108 (H) 65 - 99 mg/dL  Glucose, capillary     Status: Abnormal   Collection Time: 03/08/15  5:49 PM  Result Value Ref Range   Glucose-Capillary 118 (H) 65 - 99 mg/dL   Ct Chest W Contrast  03/07/2015   CLINICAL DATA:  Septic shock. Fever and elevated white blood cell count.  EXAM: CT CHEST, ABDOMEN, AND PELVIS WITH CONTRAST  TECHNIQUE: Multidetector CT imaging of the chest, abdomen and pelvis was performed following the standard protocol during bolus administration of intravenous contrast.  CONTRAST:  21mL OMNIPAQUE IOHEXOL  300 MG/ML SOLN, 174mL OMNIPAQUE IOHEXOL 300 MG/ML SOLN  COMPARISON:  None.  FINDINGS: CT CHEST FINDINGS  Chest wall: No breast masses, supraclavicular or axillary lymphadenopathy. Small scattered lymph nodes are noted. The thyroid gland appears normal. The bony thorax is intact. No destructive bone lesions or spinal canal compromise.  Mediastinum: The heart is normal in size. No pericardial effusion. The aorta is normal in caliber. No dissection. Moderate scattered atherosclerotic calcifications. Three-vessel coronary artery calcifications are noted. Accessory hemi azygos vein is noted on the left. No mediastinal or hilar mass or adenopathy. The esophagus is grossly normal.  Lungs/pleura:  Moderate bilateral pleural effusions with overlying atelectasis. No pulmonary edema or worrisome pulmonary lesions. No definite infiltrates.  CT ABDOMEN AND PELVIS FINDINGS  Hepatobiliary: No focal hepatic lesions or intrahepatic biliary dilatation. Possible mild gallbladder wall thickening but no pericholecystic fluid. No common bile duct dilatation.  Pancreas: No mass, inflammation or ductal dilatation.  Spleen: Normal size.  No focal lesions.  Adrenals/Urinary Tract: The adrenal glands and kidneys are unremarkable. A right renal calculus is noted. No obstructing ureteral calculi. No renal mass or evidence of pyelonephritis.  Stomach/Bowel: The stomach, duodenum, small bowel and colon are grossly normal. No inflammatory changes, mass lesions or lymphadenopathy. Presacral and extensive perirectal soft tissue thickening suspicious for severe proctitis. The appendix is normal.  Vascular/Lymphatic: No mesenteric or retroperitoneal mass or adenopathy. Small scattered lymph nodes are noted. Advanced atherosclerotic calcifications involving the aorta and branch vessels but no dissection or focal aneurysm.  Other: No pelvic mass or lymphadenopathy. There are uterine fibroids noted. The bladder contains a Foley catheter. Areas noted in the bladder. No pelvic adenopathy or inguinal adenopathy.  There is diffuse subcutaneous soft tissue swelling/edema/ fluid which may suggest anasarca are cellulitis. There is a small amount of free abdominal and free pelvic fluid.  Musculoskeletal: Severe scoliosis. No acute bony findings or destructive bony changes.  IMPRESSION: 1. Moderate bilateral pleural effusions with overlying atelectasis but no worrisome pulmonary lesions or definite infiltrates. 2. Marked diffuse rectal wall thickening suggesting severe proctitis. No ulcer, mass or perforation. 3. Advanced atherosclerotic calcifications involving the aorta and branch vessels. 4. Right renal calculus but no obstructing ureteral  calculi. 5. No abdominal/pelvic mass or adenopathy. 6. Uterine fibroids. 7. Diffuse subcutaneous soft tissue swelling/edema/fluid.   Electronically Signed   By: Marijo Sanes M.D.   On: 03/07/2015 14:18   Ct Abdomen Pelvis W Contrast  03/07/2015   CLINICAL DATA:  Septic shock. Fever and elevated white blood cell count.  EXAM: CT CHEST, ABDOMEN, AND PELVIS WITH CONTRAST  TECHNIQUE: Multidetector CT imaging of the chest, abdomen and pelvis was performed following the standard protocol during bolus administration of intravenous contrast.  CONTRAST:  7mL OMNIPAQUE IOHEXOL 300 MG/ML SOLN, 110mL OMNIPAQUE IOHEXOL 300 MG/ML SOLN  COMPARISON:  None.  FINDINGS: CT CHEST FINDINGS  Chest wall: No breast masses, supraclavicular or axillary lymphadenopathy. Small scattered lymph nodes are noted. The thyroid gland appears normal. The bony thorax is intact. No destructive bone lesions or spinal canal compromise.  Mediastinum: The heart is normal in size. No pericardial effusion. The aorta is normal in caliber. No dissection. Moderate scattered atherosclerotic calcifications. Three-vessel coronary artery calcifications are noted. Accessory hemi azygos vein is noted on the left. No mediastinal or hilar mass or adenopathy. The esophagus is grossly normal.  Lungs/pleura: Moderate bilateral pleural effusions with overlying atelectasis. No pulmonary edema or worrisome pulmonary lesions. No definite infiltrates.  CT ABDOMEN AND PELVIS FINDINGS  Hepatobiliary:  No focal hepatic lesions or intrahepatic biliary dilatation. Possible mild gallbladder wall thickening but no pericholecystic fluid. No common bile duct dilatation.  Pancreas: No mass, inflammation or ductal dilatation.  Spleen: Normal size.  No focal lesions.  Adrenals/Urinary Tract: The adrenal glands and kidneys are unremarkable. A right renal calculus is noted. No obstructing ureteral calculi. No renal mass or evidence of pyelonephritis.  Stomach/Bowel: The stomach,  duodenum, small bowel and colon are grossly normal. No inflammatory changes, mass lesions or lymphadenopathy. Presacral and extensive perirectal soft tissue thickening suspicious for severe proctitis. The appendix is normal.  Vascular/Lymphatic: No mesenteric or retroperitoneal mass or adenopathy. Small scattered lymph nodes are noted. Advanced atherosclerotic calcifications involving the aorta and branch vessels but no dissection or focal aneurysm.  Other: No pelvic mass or lymphadenopathy. There are uterine fibroids noted. The bladder contains a Foley catheter. Areas noted in the bladder. No pelvic adenopathy or inguinal adenopathy.  There is diffuse subcutaneous soft tissue swelling/edema/ fluid which may suggest anasarca are cellulitis. There is a small amount of free abdominal and free pelvic fluid.  Musculoskeletal: Severe scoliosis. No acute bony findings or destructive bony changes.  IMPRESSION: 1. Moderate bilateral pleural effusions with overlying atelectasis but no worrisome pulmonary lesions or definite infiltrates. 2. Marked diffuse rectal wall thickening suggesting severe proctitis. No ulcer, mass or perforation. 3. Advanced atherosclerotic calcifications involving the aorta and branch vessels. 4. Right renal calculus but no obstructing ureteral calculi. 5. No abdominal/pelvic mass or adenopathy. 6. Uterine fibroids. 7. Diffuse subcutaneous soft tissue swelling/edema/fluid.   Electronically Signed   By: Marijo Sanes M.D.   On: 03/07/2015 14:18   Dg Chest Port 1 View  03/07/2015   CLINICAL DATA:  Patient is febrile.  EXAM: PORTABLE CHEST - 1 VIEW  COMPARISON:  03/01/2015  FINDINGS: The cardiac silhouette is normal for portable technique. There has been interval development of bilateral pleural effusions and bibasilar airspace consolidation versus atelectasis. No evidence of pneumothorax. Soft tissues and osseous structures are without acute abnormality.  IMPRESSION: Interval development of bilateral  pleural effusions and bilateral lower lobes airspace consolidation versus atelectasis. This represents an acute change from the previous radiograph dated 03/01/2015   Electronically Signed   By: Fidela Salisbury M.D.   On: 03/07/2015 10:35   Review of Systems  Constitutional: Positive for fever and malaise/fatigue. Negative for chills, weight loss and diaphoresis.  HENT: Negative.   Eyes: Negative.   Cardiovascular: Positive for leg swelling.  Gastrointestinal: Positive for diarrhea. Negative for heartburn, nausea, vomiting and abdominal pain.  Musculoskeletal: Positive for back pain and joint pain.  Skin: Negative.   Neurological: Positive for weakness.  Psychiatric/Behavioral: Negative for depression, suicidal ideas, hallucinations, memory loss and substance abuse. The patient is not nervous/anxious and does not have insomnia.    Blood pressure 114/65, pulse 91, temperature 100.5 F (38.1 C), temperature source Oral, resp. rate 18, height 5\' 3"  (1.6 m), weight 86.6 kg (190 lb 14.7 oz), SpO2 100 %. Physical Exam  Constitutional: She is oriented to person, place, and time. She appears well-developed and well-nourished.  HENT:  Head: Normocephalic and atraumatic.  Eyes: Conjunctivae are normal.  Cardiovascular: Normal rate and regular rhythm.   Respiratory: Effort normal and breath sounds normal.  GI: Soft. Bowel sounds are normal. She exhibits no distension and no mass. There is no tenderness. There is no rebound and no guarding.  Neurological: She is alert and oriented to person, place, and time.  Skin: Skin is warm and dry.  Psychiatric: She has a normal mood and affect. Her behavior is normal. Judgment and thought content normal.   Assessment/Plan: 1) Fever of unknown origin with abnormal CT scan-thickening of the rectal wall on CT. Will plan a flexible sigmoidoscopy tomorrow. Will give enemas in the morning. 2) Anemia of chronic disease.  3) Multiple sclerosis diagnosed in 2001.   4) Quadriparesis.  5) Diabetes mellitus with PVD.  MANN,JYOTHI 03/08/2015, 6:41 PM

## 2015-03-08 NOTE — Progress Notes (Signed)
Date:  Sept. 19, 2016 U.R. performed for needs and level of care. Will continue to follow for Case Management needs.  Rhonda Davis, RN, BSN, CCM   [phone] 

## 2015-03-08 NOTE — Consult Note (Signed)
[facility]    Date of Admission:  03/01/2015   Total days of antibiotics 7        Day 4 ceftriaxone        Day 1 metronidazole              Reason for Consult: Persistent, unexplained fever    Referring Physician: Dr. Dedra Skeens Divine  Principal Problem:   Fever Active Problems:   E. coli UTI (urinary tract infection)   Citrobacter infection   Hypokalemia   Multiple sclerosis   Sepsis due to Escherichia coli with acute renal failure   Septic shock   Diabetes mellitus with peripheral vascular disease   Leukocytosis   Hypophosphatemia   Hypomagnesemia   Anemia of chronic disease   Decubitus ulcer, stage 2   Lymphedema   . baclofen  5 mg Oral TID  . cefTRIAXone (ROCEPHIN)  IV  2 g Intravenous Q24H  . feeding supplement (PRO-STAT SUGAR FREE 64)  30 mL Oral BID  . heparin subcutaneous  5,000 Units Subcutaneous 3 times per day  . insulin aspart  0-5 Units Subcutaneous QHS  . insulin aspart  0-9 Units Subcutaneous TID WC  . metroNIDAZOLE  500 mg Oral 3 times per day  . midodrine  2.5 mg Oral BID WC  . pantoprazole  40 mg Oral Daily  . sodium chloride  3 mL Intravenous Q12H    Recommendations: 1. Continue ceftriaxone and metronidazole 2. Stool for C. difficile screen   Assessment: The cause of her fever is not entirely clear to me. I do not see any residual cellulitis in her right leg and I do not see anything that clearly looks like a deeper soft tissue abscess or infection. I do not think it is worth MRI or CT scan of her lower extremities at this point. The fact that she has now developed some diarrhea (could possibly be related to the contrast dye given for yesterday's CT scans) and has rectal wall thickening on CT scan suggests the possibility of infectious proctitis. We have added metronidazole today I will check a C. difficile screen. I will follow-up tomorrow.    HPI: Jocelyn Sanchez is a 59 y.o. 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 She had leukocytosis of 20,700 and acute on chronic anemia with a hemoglobin of 6.8. Possible right leg cellulitis complicated by sepsis was mentioned in the admission note. Overall she is feeling better and her white blood cell count has come down some but her temperature has risen steadily to 103. She has severe anasarca and has been a very difficult stick resulting in no lab work for the past several days. She did have a CT scan of her chest abdomen and pelvis yesterday which showed moderate bilateral pleural effusions and marked, diffuse rectal wall thickening suggestive of severe proctitis. She states that she had some diarrhea overnight.   Review of Systems: Constitutional: positive for fatigue and fevers, negative for chills, sweats and weight loss Eyes: negative Ears, nose, mouth, throat, and face: negative Respiratory: negative Cardiovascular: negative Gastrointestinal: positive for diarrhea, negative for abdominal pain, constipation, nausea and vomiting Genitourinary:negative Integument/breast: negative for pruritus and rash Musculoskeletal:positive for back pain, muscle weakness and neck pain Neurological: positive for coordination problems, gait problems, weakness and quadriparesis related to her MS, negative for headaches and memory  problems  Past Medical History  Diagnosis Date  . MS (multiple sclerosis)   . HTN (hypertension)   . DM II (diabetes mellitus, type II), controlled   . Lymphedema   . Anemia   . Gait disorder     Social History  Substance Use Topics  . Smoking status: Former Smoker    Types: Cigarettes  . Smokeless tobacco: Never Used  . Alcohol Use: No    Family History  Problem Relation Age of Onset  . Multiple sclerosis Mother    Allergies  Allergen Reactions  . Sulfa Antibiotics Shortness Of Breath and Swelling     OBJECTIVE: Blood pressure 114/65, pulse 91, temperature 100.5 F (38.1 C), temperature source Oral, resp. rate 18, height 5\' 3"  (1.6 m), weight 190 lb 14.7 oz (86.6 kg), SpO2 100 %. General: She is resting quietly in bed watching television Skin: No rash. She has a superficial sacral pressure sore without evidence of infection Lymph nodes: No palpable adenopathy Eyes: Normal external exam Oral: No oropharyngeal lesions Lungs: Clear anteriorly Cor: Distant but regular S1 and S2 with no murmurs Abdomen: Obese, soft and nontender without sounds. No palpable masses Joints and extremities: Diffuse anasarca. She has dry thickened skin of her lower legs compatible with chronic stasis dermatitis. There is no overlying cellulitis. There is no obvious fluctuance. There is no area of particular tenderness with palpation. Neurologic: Alert with normal speech and conversation  Lab Results Lab Results  Component Value Date   WBC 18.3* 03/05/2015   HGB 10.6* 03/05/2015   HCT 30.7* 03/05/2015   MCV 81.2 03/05/2015   PLT 348 03/05/2015    Lab Results  Component Value Date   CREATININE 0.39* 03/05/2015   BUN 8 03/05/2015   NA 136 03/05/2015   K 4.0 03/05/2015   CL 105 03/05/2015   CO2 22 03/05/2015    Lab Results  Component Value Date   ALT 8* 03/03/2015   AST 15 03/03/2015   ALKPHOS 73 03/03/2015   BILITOT 0.6 03/03/2015     Microbiology: Recent Results (from the past 240 hour(s))  Culture, blood (x 2)     Status: None   Collection Time: 03/01/15  1:51 PM  Result Value Ref Range Status   Specimen Description BLOOD LEFT ARM  Final   Special Requests IN PEDIATRIC BOTTLE 3CC  Final   Culture   Final    NO GROWTH 5 DAYS Performed at [facility]    Report Status 03/06/2015 FINAL  Final  Culture, blood (x 2)     Status: None   Collection Time: 03/01/15  1:51 PM  Result Value Ref Range Status   Specimen Description BLOOD RIGHT ARM  Final   Special Requests BOTTLES DRAWN  AEROBIC AND ANAEROBIC 10CC  Final   Culture  Setup Time   Final    GRAM NEGATIVE RODS IN BOTH AEROBIC AND ANAEROBIC BOTTLES CRITICAL RESULT CALLED TO, READ BACK BY AND VERIFIED WITH: C CREECH@0650  03/02/15    Culture   Final    CITROBACTER SPECIES Performed at [facility]    Report Status 03/05/2015 FINAL  Final   Organism ID, Bacteria CITROBACTER SPECIES  Final      Susceptibility   Citrobacter species - MIC*    CEFAZOLIN >=64 RESISTANT Resistant     CEFEPIME <=1 SENSITIVE Sensitive     CEFTAZIDIME <=1 SENSITIVE Sensitive     CEFTRIAXONE <=1 SENSITIVE Sensitive     CIPROFLOXACIN <=0.25 SENSITIVE Sensitive  GENTAMICIN <=1 SENSITIVE Sensitive     IMIPENEM 2 SENSITIVE Sensitive     TRIMETH/SULFA <=20 SENSITIVE Sensitive     PIP/TAZO <=4 SENSITIVE Sensitive     * CITROBACTER SPECIES  Urine culture     Status: None   Collection Time: 03/01/15  7:23 PM  Result Value Ref Range Status   Specimen Description URINE, CATHETERIZED  Final   Special Requests NONE  Final   Culture   Final    80,000 COLONIES/ml ESCHERICHIA COLI Performed at [facility]    Report Status 03/04/2015 FINAL  Final   Organism ID, Bacteria ESCHERICHIA COLI  Final      Susceptibility   Escherichia coli - MIC*    AMPICILLIN >=32 RESISTANT Resistant     CEFAZOLIN <=4 SENSITIVE Sensitive     CEFTRIAXONE <=1 SENSITIVE Sensitive     CIPROFLOXACIN >=4 RESISTANT Resistant     GENTAMICIN <=1 SENSITIVE Sensitive     IMIPENEM <=0.25 SENSITIVE Sensitive     NITROFURANTOIN <=16 SENSITIVE Sensitive     TRIMETH/SULFA <=20 SENSITIVE Sensitive     AMPICILLIN/SULBACTAM >=32 RESISTANT Resistant     PIP/TAZO 8 SENSITIVE Sensitive     * 80,000 COLONIES/ml ESCHERICHIA COLI  MRSA PCR Screening     Status: Abnormal   Collection Time: 03/01/15  9:34 PM  Result Value Ref Range Status   MRSA by PCR POSITIVE (A) NEGATIVE Final    Comment:        The GeneXpert MRSA Assay (FDA approved for NASAL specimens only),  is one component of a comprehensive MRSA colonization surveillance program. It is not intended to diagnose MRSA infection nor to guide or monitor treatment for MRSA infections. RESULT CALLED TO, READ BACK BY AND VERIFIED WITH: M.REEVES,RN AT 2334 ON 03/02/15 BY W.Garfield, [facility] [phone] pager   [phone] cell 03/08/2015, 3:13 PM

## 2015-03-08 NOTE — Progress Notes (Addendum)
Patient ID: Jocelyn Sanchez, female   DOB: Jun 14, 1956, 59 y.o.   MRN: [id number] [facility] PROGRESS NOTE  DANIQUA CAMPOY TDD:[id number] DOB: 1955-06-24 DOA: 03/01/2015 PCP: [name], MD  Brief narrative:    59 y.o. female with past medical history of multiple sclerosis, diabetes mellitus type 2, hypertension, lower extremity lymphedema who presented to [facility] ED 03/01/15 with initial concern for low back pain. She was subsequently founds to have citrobacter bacteremia and E.Coli UTI and has developed a septic shock. She was on PCCM service through 9/16 and transferred to [facility] 9/17.  SIGNIFICANT EVENTS  9/12 - Admission to hospital 9/12 - Transfusion of PRBC 9/13 - Radial arterial line placed. 9/14 - Radial arterial line discontinued. 9/17 - Care transitioned to [facility] 9/18 - Spiking fevers. CT abd and chest ordered for eval of fever - severe proctitis 9/19 - Consulted GI and ID  Anticipated discharge: Patient is continuing to spike fevers. Because of severe lymphedema we were unable to place PICC line and she is declining to have IJ place. Since now we have a finding of what seems to be severe proctitis GI was consulted. ID also consulted because of ongoing fevers.  Assessment/Plan:    Principal Problem: Septic shock secondary to E coli UTI and Citrobacter bacteremia / Severe sepsis  - Patient presented with septic shock on the admission. This is secondary to Citrobacter bacteremia and Escherichia coli UTI. Also note, TTE was negative for vegetations. - We were unable to repeat blood cultures because of difficult IV access. Because of significant lymphedema we were not able to place PICC line. - She continues to meet sepsis criteria. Please note we don't have a blood work because of difficult IV access she refused blood work in the morning. - Because of ongoing fevers, infectious disease consulted. Recommendation was to add Flagyl for now and we will switch from rocephin to  ceftazidime. CT scan was significant for severe proctitis for which reason we also consulted the GI. - As part of the fever workup we also ordered chest x-ray and CT chest. Chest x-ray demonstrated moderate pleural effusions but now infiltrates or masses. - We appreciate ID consult and recommendation very much.  Active Problems: Severe proctitis - Patient spiking fevers so CT abdomen and pelvis ordered as part of the fever workup. She was found to have severe proctitis. - No previous colonoscopies per patient. - GI consulted, Dr. Collene Mares to see the patient.  Acute renal failure complicated by non anion gap metabolic acidosis / Hyperkalemia / Hypokalemia  - Secondary to septic shock - Creatinine improved with treatment for sepsis. Not sure if her kidney function is still stable because patient declined to have blood work in the morning because of difficult IV access.  - In regards to electrolytes stability, again not sure where she is with hyperkalemia or hypokalemia. This was corrected.   Hypophosphatemia / Hypomagnesemia - Supplemented -  as noted, patient refused blood work so we are not sure where she stands with electrolyte balance.  Chronic lymphedema with sacral decubitus ulcer, stage 2 - Wound care consulted 03/02/2015, appreciate their assessment  - Patient with long-standing lymphedema and recurrence of pressure injury over area that had been previously healed. Pt also has indwelling urinary catheter and is incontinent of stool. - Per wound care, wound type: moisture associated skin damage plus pressure and friction in an area of previous ulceration. Measurement:1.5cm x 2cm x 0.2cm Stage 2 in an area measuring 6cm x 8cm of scar  tissue  - Dressing recommendations: A soft silicone foam dressing is provided; bilateral pressure redistribution heel boots provided to prevent injury in the presence of the weight and immobility resulting from the lymphedema.   Diabetes mellitus type 2 with  peripheral vascular manifestations - A1c in 11/2014 - 6.7 indicating good glycemic control  will continue sliding scale insulin  Anemia of chronic disease  - Likely from multiple sclerosis - S/p 2 units of PRBCs on 03/01/15 - Once patient allows blood work. Will recheck CBC.   Multiple sclerosis with quadriparesis   - Not on treatment and does not follow with neurology.   Acute low back pain. - Lumbar spine x-ray without acute findings.  - Improved since admission.    DVT Prophylaxis  - Heparin subQ    Code Status: Full.  Family Communication:  plan of care discussed with the patient Disposition Plan: Consulted GI for severe proctitis and infectious disease for ongoing fevers.  IV access:  Peripheral IV  Procedures and diagnostic studies:    Dg Chest Port 1 View 2015/03/21    Interval development of bilateral pleural effusions and bilateral lower lobes airspace consolidation versus atelectasis. This represents an acute change from the previous radiograph dated 03/01/2015   Electronically Signed   By: Fidela Salisbury M.D.   On: 2015/03/21 10:35  Dg Lumbar Spine 2-3 Views 03/01/2015  1. Considerable lumbar spondylosis, scoliosis, and degenerative disc disease with mild subluxations at L3-4 and L4-5. The scoliotic curvature oblique and indistinct on the lateral projection. I do not see a definite fracture. MRI (or CT) would be a superior way to assess the lumbar spine for impingement given the limitations due to the degree of scoliosis.   Electronically Signed   By: Van Clines M.D.   On: 03/01/2015 18:39   Dg Chest Portable 1 View 03/01/2015   No acute cardiopulmonary disease.   Electronically Signed   By: Lajean Manes M.D.   On: 03/01/2015 14:49    Medical Consultants:  Cardiology PCCM consulted and then primary through 9/16 Infectious disease, Dr. Michel Bickers Gastroenterology, Dr. Tyrone Sage  Other Consultants:  PT evaluation Nutrition WOC  IAnti-Infectives:    Vanco and zosyn stopped 03/05/2015 Rocephin 03/05/2015 --> 03/08/2015  Flagyl 03/08/2015 --> Tressie Ellis 03/08/2015 -->    Leisa Lenz, MD  [facility] Pager [phone]  Time spent in minutes: 25 minutes  If 7PM-7AM, please contact night-coverage [email or web address] Password [email or web address] 03/08/2015, 10:07 AM   LOS: 7 days    HPI/Subjective: No acute overnight events. Patient crying this morning says she feels miserable. She wants to go home but knows she is not stable to go home yet.   Objective: Filed Vitals:   03/21/15 1500 2015-03-21 2200 03/08/15 0108 03/08/15 0521  BP: 115/63 113/61  114/65  Pulse: 100 91  91  Temp: 101.5 F (38.6 C) 103 F (39.4 C) 100.4 F (38 C) 100.5 F (38.1 C)  TempSrc: Oral Oral Oral Oral  Resp: 18 19  18   Height:      Weight:    86.6 kg (190 lb 14.7 oz)  SpO2: 93% 96%  100%    Intake/Output Summary (Last 24 hours) at 03/08/15 1007 Last data filed at 03/08/15 0900  Gross per 24 hour  Intake    360 ml  Output    475 ml  Net   -115 ml    Exam:   General:  Pt is not in acute distress, alert   Cardiovascular: Rate controlled, S1-S2 appreciated  Respiratory: Clear to auscultation, no wheezing   Abdomen:  nontender, bowel sounds appreciated   Extremities: lymphedema  throughout, pulses palpable   Neuro: Nonfocal    Data Reviewed: Basic Metabolic Panel:  Recent Labs Lab 03/01/15 1326 03/02/15 0522 03/03/15 0525 03/04/15 0320 03/05/15 0030  NA 134* 141 142 139 136  K 4.3 3.3* 2.8* 3.7 4.0  CL 103 115* 110 105 105  CO2 17* 15* 24 25 22   GLUCOSE 194* 92 154* 154* 123*  BUN 94* 54* 21* 13 8  CREATININE 1.81* 0.91 0.54 0.51 0.39*  CALCIUM 9.3 8.2* 7.6* 7.8* 7.5*  MG  --   --   --  1.3* 1.5*  PHOS  --   --   --  1.1* 2.3*   Liver Function Tests:  Recent Labs Lab 03/01/15 1326 03/02/15 0522 03/03/15 0525 03/04/15 0320 03/05/15 0030  AST 15 15 15   --   --   ALT 10* 9* 8*  --   --   ALKPHOS 68 58 73  --   --   BILITOT 0.6 0.9  0.6  --   --   PROT 7.8 6.3* 5.3*  --   --   ALBUMIN 3.0* 2.5* 1.8* 1.9* 1.7*   No results for input(s): LIPASE, AMYLASE in the last 168 hours. No results for input(s): AMMONIA in the last 168 hours. CBC:  Recent Labs Lab 03/01/15 1326 03/02/15 0522 03/03/15 0525 03/04/15 0320 03/05/15 0355  WBC 28.7* 27.6* 30.9* 35.3* 18.3*  NEUTROABS 27.2*  --   --  31.7* 14.1*  HGB 6.8* 11.4* 9.7* 10.5* 10.6*  HCT 20.5* 34.3* 27.9* 30.0* 30.7*  MCV 81.0 83.7 80.4 80.9 81.2  PLT 646* 386 339 379 348   Cardiac Enzymes: No results for input(s): CKTOTAL, CKMB, CKMBINDEX, TROPONINI in the last 168 hours. BNP: Invalid input(s): POCBNP CBG:  Recent Labs Lab 03/07/15 0752 03/07/15 1226 03/07/15 1616 03/07/15 2158 03/08/15 0739  GLUCAP 144* 139* 163* 128* 96    Culture, blood (x 2)     Status: None   Collection Time: 03/01/15  1:51 PM  Result Value Ref Range Status   Specimen Description BLOOD LEFT ARM  Final   Special Requests IN PEDIATRIC BOTTLE 3CC  Final   Culture   Final    NO GROWTH 5 DAYS Performed at [facility]    Report Status 03/06/2015 FINAL  Final  Culture, blood (x 2)     Status: None   Collection Time: 03/01/15  1:51 PM  Result Value Ref Range Status   Specimen Description BLOOD RIGHT ARM  Final   Special Requests BOTTLES DRAWN AEROBIC AND ANAEROBIC 10CC  Final   Culture  Setup Time   Final   Culture   Final   Report Status 03/05/2015 FINAL  Final   Organism ID, Bacteria CITROBACTER SPECIES  Final      Susceptibility   Citrobacter species - MIC*    CEFAZOLIN >=64 RESISTANT Resistant     CEFEPIME <=1 SENSITIVE Sensitive     CEFTAZIDIME <=1 SENSITIVE Sensitive     CEFTRIAXONE <=1 SENSITIVE Sensitive     CIPROFLOXACIN <=0.25 SENSITIVE Sensitive     GENTAMICIN <=1 SENSITIVE Sensitive     IMIPENEM 2 SENSITIVE Sensitive     TRIMETH/SULFA <=20 SENSITIVE Sensitive     PIP/TAZO <=4 SENSITIVE Sensitive     * CITROBACTER SPECIES  Urine culture     Status:  None   Collection Time: 03/01/15  7:23 PM  Result  Value Ref Range Status   Specimen Description URINE, CATHETERIZED  Final   Special Requests NONE  Final   Culture   Final   Report Status 03/04/2015 FINAL  Final   Organism ID, Bacteria ESCHERICHIA COLI  Final      Susceptibility   Escherichia coli - MIC*    AMPICILLIN >=32 RESISTANT Resistant     CEFAZOLIN <=4 SENSITIVE Sensitive     CEFTRIAXONE <=1 SENSITIVE Sensitive     CIPROFLOXACIN >=4 RESISTANT Resistant     GENTAMICIN <=1 SENSITIVE Sensitive     IMIPENEM <=0.25 SENSITIVE Sensitive     NITROFURANTOIN <=16 SENSITIVE Sensitive     TRIMETH/SULFA <=20 SENSITIVE Sensitive     AMPICILLIN/SULBACTAM >=32 RESISTANT Resistant     PIP/TAZO 8 SENSITIVE Sensitive     * 80,000 COLONIES/ml ESCHERICHIA COLI  MRSA PCR Screening     Status: Abnormal   Collection Time: 03/01/15  9:34 PM  Result Value Ref Range Status   MRSA by PCR POSITIVE (A) NEGATIVE Final     Scheduled Meds: . baclofen  5 mg Oral TID  . cefTRIAXone  2 g Intravenous Q24H  . heparin subcutaneous  5,000 Units Subcutaneous 3 times per day  . insulin aspart  0-5 Units Subcutaneous QHS  . insulin aspart  0-9 Units Subcutaneous TID WC  . midodrine  2.5 mg Oral BID WC  . pantoprazole  40 mg Oral Daily

## 2015-03-08 NOTE — Progress Notes (Signed)
ANTIBIOTIC CONSULT NOTE - INITIAL  Pharmacy Consult for Ceftazidime Indication: Sepsis  Allergies  Allergen Reactions  . Sulfa Antibiotics Shortness Of Breath and Swelling    Patient Measurements: Height: 5\' 3"  (160 cm) Weight: 190 lb 14.7 oz (86.6 kg) IBW/kg (Calculated) : 52.4  Vital Signs: Temp: 100.5 F (38.1 C) (09/19 0521) Temp Source: Oral (09/19 0521) BP: 114/65 mmHg (09/19 0521) Pulse Rate: 91 (09/19 0521) Intake/Output from previous day: 09/18 0701 - 09/19 0700 In: 480 [P.O.:480] Out: 475 [Urine:475] Intake/Output from this shift: Total I/O In: 240 [P.O.:240] Out: -   Labs: No results for input(s): WBC, HGB, PLT, LABCREA, CREATININE in the last 72 hours. Estimated Creatinine Clearance: 78 mL/min (by C-G formula based on Cr of 0.39). No results for input(s): VANCOTROUGH, VANCOPEAK, VANCORANDOM, GENTTROUGH, GENTPEAK, GENTRANDOM, TOBRATROUGH, TOBRAPEAK, TOBRARND, AMIKACINPEAK, AMIKACINTROU, AMIKACIN in the last 72 hours.   Microbiology: Recent Results (from the past 720 hour(s))  Urine culture     Status: None   Collection Time: 02/17/15  8:53 PM  Result Value Ref Range Status   Specimen Description URINE, CATHETERIZED  Final   Special Requests Normal  Final   Culture   Final    NO GROWTH 2 DAYS Performed at [facility]    Report Status 02/19/2015 FINAL  Final  Culture, blood (x 2)     Status: None   Collection Time: 03/01/15  1:51 PM  Result Value Ref Range Status   Specimen Description BLOOD LEFT ARM  Final   Special Requests IN PEDIATRIC BOTTLE 3CC  Final   Culture   Final    NO GROWTH 5 DAYS Performed at [facility]    Report Status 03/06/2015 FINAL  Final  Culture, blood (x 2)     Status: None   Collection Time: 03/01/15  1:51 PM  Result Value Ref Range Status   Specimen Description BLOOD RIGHT ARM  Final   Special Requests BOTTLES DRAWN AEROBIC AND ANAEROBIC 10CC  Final   Culture  Setup Time   Final    GRAM NEGATIVE RODS IN  BOTH AEROBIC AND ANAEROBIC BOTTLES CRITICAL RESULT CALLED TO, READ BACK BY AND VERIFIED WITH: C CREECH@0650  03/02/15    Culture   Final    CITROBACTER SPECIES Performed at [facility]    Report Status 03/05/2015 FINAL  Final   Organism ID, Bacteria CITROBACTER SPECIES  Final      Susceptibility   Citrobacter species - MIC*    CEFAZOLIN >=64 RESISTANT Resistant     CEFEPIME <=1 SENSITIVE Sensitive     CEFTAZIDIME <=1 SENSITIVE Sensitive     CEFTRIAXONE <=1 SENSITIVE Sensitive     CIPROFLOXACIN <=0.25 SENSITIVE Sensitive     GENTAMICIN <=1 SENSITIVE Sensitive     IMIPENEM 2 SENSITIVE Sensitive     TRIMETH/SULFA <=20 SENSITIVE Sensitive     PIP/TAZO <=4 SENSITIVE Sensitive     * CITROBACTER SPECIES  Urine culture     Status: None   Collection Time: 03/01/15  7:23 PM  Result Value Ref Range Status   Specimen Description URINE, CATHETERIZED  Final   Special Requests NONE  Final   Culture   Final    80,000 COLONIES/ml ESCHERICHIA COLI Performed at [facility]    Report Status 03/04/2015 FINAL  Final   Organism ID, Bacteria ESCHERICHIA COLI  Final      Susceptibility   Escherichia coli - MIC*    AMPICILLIN >=32 RESISTANT Resistant     CEFAZOLIN <=4  SENSITIVE Sensitive     CEFTRIAXONE <=1 SENSITIVE Sensitive     CIPROFLOXACIN >=4 RESISTANT Resistant     GENTAMICIN <=1 SENSITIVE Sensitive     IMIPENEM <=0.25 SENSITIVE Sensitive     NITROFURANTOIN <=16 SENSITIVE Sensitive     TRIMETH/SULFA <=20 SENSITIVE Sensitive     AMPICILLIN/SULBACTAM >=32 RESISTANT Resistant     PIP/TAZO 8 SENSITIVE Sensitive     * 80,000 COLONIES/ml ESCHERICHIA COLI  MRSA PCR Screening     Status: Abnormal   Collection Time: 03/01/15  9:34 PM  Result Value Ref Range Status   MRSA by PCR POSITIVE (A) NEGATIVE Final    Comment:        The GeneXpert MRSA Assay (FDA approved for NASAL specimens only), is one component of a comprehensive MRSA colonization surveillance program. It is  not intended to diagnose MRSA infection nor to guide or monitor treatment for MRSA infections. RESULT CALLED TO, READ BACK BY AND VERIFIED WITH: M.REEVES,RN AT 2334 ON 03/02/15 BY W.SHEA     Medical History: Past Medical History  Diagnosis Date  . MS (multiple sclerosis)   . HTN (hypertension)   . DM II (diabetes mellitus, type II), controlled   . Lymphedema   . Anemia   . Gait disorder     Medications:  Scheduled:  . baclofen  5 mg Oral TID  . cefTAZidime (FORTAZ)  IV  2 g Intravenous 3 times per day  . feeding supplement (PRO-STAT SUGAR FREE 64)  30 mL Oral BID  . heparin subcutaneous  5,000 Units Subcutaneous 3 times per day  . insulin aspart  0-5 Units Subcutaneous QHS  . insulin aspart  0-9 Units Subcutaneous TID WC  . metroNIDAZOLE  500 mg Oral 3 times per day  . midodrine  2.5 mg Oral BID WC  . pantoprazole  40 mg Oral Daily  . sodium chloride  3 mL Intravenous Q12H   Infusions:   PRN: Place/Maintain arterial line **AND** sodium chloride, acetaminophen **OR** [DISCONTINUED] acetaminophen, albuterol, methocarbamol, zolpidem  Assessment: 59 y.o. female with multiple sclerosis,DM2, HTN, lower extremity lymphedema who presented 03/01/15 with initial concern for low back pain. She was subsequently founds to have Citrobacter bacteremia and E.Coli UTI and septic shock.  She was initially treated with vancomycin and Zosyn, then narrowed to ceftriaxone.  She is now having ongoing fevers and presumed sever proctitis.  ID and GI are consulting.    Pharmacy is now consulted to change ceftriaxone to ceftazidime.  Spoke with Dr. Charlies Silvers, clarified that antibiotic regimen is to be ceftazidime + flagyl (r/o C.diff due to diarrhea).  9/12 >> Vanc >> 9/16 9/12 >> Zosyn >> 9/16 9/16 >> Ceftriaxone >> 9/19 9/19 >> Ceftazidime >>  MRSA swab positive 9/12 blood x 2: citrobacter sp (pan-sens except Cefazolin, sens: cefepime, ceftaz, ctx, cipro, gent, imi, p/t, t/s) 9/12 Urine: 80k Ecoli  (sens: cefazolin, ctx, gent, imi, ntf, p/t, t/s. Resist: amp, amp/sul, cipro) 9/16 blood x2: canceled  9/19 C.diff:  Goal of Therapy:  Eradication of infection Dose appropriate for indication and renal function  Plan:   Ceftazidime 2g IV q8h Follow up renal function & cultures  Peggyann Juba, PharmD, BCPS Pager: [phone] 03/08/2015,4:50 PM

## 2015-03-08 NOTE — Progress Notes (Signed)
Initial Nutrition Assessment  DOCUMENTATION CODES:   Not applicable  INTERVENTION:  - Will order Prostat BID, each supplement provides 100 kcal and 15 grams protein - Assistance with feeding at meals - RD will continue to monitor for needs  NUTRITION DIAGNOSIS:   Increased nutrient needs related to wound healing as evidenced by estimated needs.  GOAL:   Patient will meet greater than or equal to 90% of their needs  MONITOR:   PO intake, Supplement acceptance, Weight trends, Labs, I & O's  REASON FOR ASSESSMENT:   Consult Diet education  ASSESSMENT:   59 y.o. female , single, lives with mother, wheelchair mobile, PMH of multiple sclerosis, DM 2, HTN, chronic lower extremity lymphedema, chronic anemia, presented to the [facility] ED on 03/01/15 with complaints of low back pain. Patient able to provide significant history. No family at bedside. She states that she was in her usual state of health until sometime this morning when she started experiencing gradual onset of worsening low back pain without radiation, no aggravating or relieving factors, not associated with dysuria, foul-smelling urine or urinary frequency, denies fever or chills. She had one episode of nonbloody emesis. No abdominal pain reported. She denies headache, earache, sore throat, chest pain, cough or dyspnea. She states that her legs are unchanged and has intermittent weeping from chronic skin changes.  Pt seen for consult. BMI indicates overweight status. Pt ate 50% of breakfast and lunch 9/16, 100% breakfast and lunch 9/17, 100% breakfast and 50% lunch yesterday (9/18), and 10% breakfast this AM.   Pt reports that she does not like some of the food served here and is unhappy with Heart Healthy diet. At home she does not follow a diet and she states an aid checks her blood sugars throughout the day. Her usual reading is around 130 mg/dL, per pt. Per MD note, HbgA1c in 11/2014 was 6.7 which shows good  long-term glycemic control. Did not provide diet education about his. Did talk with her about Heart Healthy diet and reason for order.  Pt was interested in RD ordering lunch for her which she wanted to arrive at 1300. She requested Diet Sprite, mashed potatoes and gravy, jello, and angel food cake.  She states that at home she feeds herself but she has needed some assistance since admission with bilateral arm edema. She denies chewing or swallowing issues with any items other than tough meats.   Moderate to severe edema noted but no muscle or fat wasting. Pt denies recent weight changes. Chart review indicates 27 lb weight gain since 10/24/2014; partly fluid related given current edema.  Likely meeting kcal needs but not protein needs. Medications reviewed. Labs reviewed; CBGs: 96-219 mg/dL, creatinine low, Ca: 7.5 mg/dL, Phos: 2.3 mg/dL, Mg: 1.5 mg/dL.   Diet Order:  Diet heart healthy/carb modified Room service appropriate?: Yes; Fluid consistency:: Thin  Skin:  Wound (see comment) (Stage 2 sacral pressure ulcer, L leg wound)  Last BM:  9/14  Height:   Ht Readings from Last 1 Encounters:  03/01/15 5\' 3"  (1.6 m)    Weight:   Wt Readings from Last 1 Encounters:  03/08/15 190 lb 14.7 oz (86.6 kg)    Ideal Body Weight:  52.27 kg (kg)  BMI:  Body mass index is 33.83 kg/(m^2).  Estimated Nutritional Needs:   Kcal:  1900-2100  Protein:  85-95 grams  Fluid:  1.8 L/day  EDUCATION NEEDS:   Education needs addressed     Jarome Matin, RD, LDN  Inpatient Clinical Dietitian Pager # [phone] After hours/weekend pager # [phone]

## 2015-03-08 NOTE — Progress Notes (Signed)
Occupational Therapy Treatment Patient Details Name: KHARMA SAMPSEL MRN: [id number] DOB: Nov 01, 1955 Today's Date: 03/08/2015    History of present illness Pt was admitted for sepsis.  She presented to ED with back pain.  Pt has a PMH significant for MS,DM, HTN and chronic LE lymphedema   OT comments  OT session focused on positioning  Follow Up Recommendations  No OT follow up;Supervision/Assistance - 24 hour    Equipment Recommendations  None recommended by OT    Recommendations for Other Services      Precautions / Restrictions Precautions Precaution Comments: edema bil UEs Restrictions Weight Bearing Restrictions: Yes RLE Weight Bearing: Non weight bearing LLE Weight Bearing: Non weight bearing       Mobility Bed Mobility               General bed mobility comments: NT --pt is hoyer lift at home adn reports she can't tol rolling; she states her aide normally" does  it for her"  Transfers                 General transfer comment: NT -hoyer lift at home    Balance                                   ADL Overall ADL's : Needs assistance/impaired                                       General ADL Comments: pt only tolerated positoing BUE on pillows . When OT arrived BUe were down beside pt - not propped up. Pt refused BUE PROM.  Very minimal AROM noted.       Vision                     Perception     Praxis      Cognition   Behavior During Therapy: WFL for tasks assessed/performed Overall Cognitive Status: Within Functional Limits for tasks assessed                       Extremity/Trunk Assessment               Exercises     Shoulder Instructions       General Comments      Pertinent Vitals/ Pain       Faces Pain Scale: Hurts even more Pain Location: BUE with PROM Pain Descriptors / Indicators: Sore Pain Intervention(s): Monitored during session;Repositioned;Limited activity  within patient's tolerance  Home Living                                          Prior Functioning/Environment              Frequency Min 2X/week     Progress Toward Goals  OT Goals(current goals can now be found in the care plan section)  Progress towards OT goals: Not progressing toward goals - comment     Plan Discharge plan remains appropriate    Co-evaluation                 End of Session     Activity Tolerance Patient limited by pain   Patient Left in bed;with  call bell/phone within reach   Nurse Communication          Time: 9983-3825 OT Time Calculation (min): 10 min  Charges: OT General Charges $OT Visit: 1 Procedure OT Treatments $Therapeutic Activity: 8-22 mins  Harlee Eckroth, Thereasa Parkin 03/08/2015, 3:12 PM

## 2015-03-08 NOTE — Care Management Important Message (Signed)
Important Message  Patient Details IM Letter given to Rhonda/Case Manager to present to Patient.Important Message  Patient Details  Name: Jocelyn Sanchez MRN: [id number] Date of Birth: June 11, 1956   Medicare Important Message Given:  Yes-second notification given    Camillo Flaming 03/08/2015, 11:25 AM Name: Jocelyn Sanchez MRN: [id number] Date of Birth: 03-13-1956   Medicare Important Message Given:  Yes-second notification given    Camillo Flaming 03/08/2015, 11:24 AM

## 2015-03-08 NOTE — Progress Notes (Signed)
2 small areas noted to back of left lower leg.  Cleaned with NS, applied vasoline gauze and wrapped with kerlix until areas can be looked at by wound nurse.

## 2015-03-09 ENCOUNTER — Encounter (HOSPITAL_COMMUNITY)
Admission: EM | Disposition: A | Discharge status: Home Health | Payer: Self-pay | Source: Home / Self Care | Attending: Internal Medicine

## 2015-03-09 ENCOUNTER — Encounter (HOSPITAL_COMMUNITY): Payer: Self-pay | Admitting: *Deleted

## 2015-03-09 DIAGNOSIS — M546 Pain in thoracic spine: Secondary | ICD-10-CM

## 2015-03-09 DIAGNOSIS — K6289 Other specified diseases of anus and rectum: Secondary | ICD-10-CM | POA: Diagnosis present

## 2015-03-09 DIAGNOSIS — R509 Fever, unspecified: Secondary | ICD-10-CM

## 2015-03-09 DIAGNOSIS — A498 Other bacterial infections of unspecified site: Secondary | ICD-10-CM

## 2015-03-09 DIAGNOSIS — M549 Dorsalgia, unspecified: Secondary | ICD-10-CM | POA: Diagnosis present

## 2015-03-09 HISTORY — PX: FLEXIBLE SIGMOIDOSCOPY: SHX5431

## 2015-03-09 LAB — GLUCOSE, CAPILLARY
GLUCOSE-CAPILLARY: 105 mg/dL — AB (ref 65–99)
GLUCOSE-CAPILLARY: 111 mg/dL — AB (ref 65–99)
Glucose-Capillary: 115 mg/dL — ABNORMAL HIGH (ref 65–99)
Glucose-Capillary: 115 mg/dL — ABNORMAL HIGH (ref 65–99)

## 2015-03-09 SURGERY — SIGMOIDOSCOPY, FLEXIBLE
Anesthesia: Moderate Sedation | Laterality: Left

## 2015-03-09 MED ORDER — SODIUM CHLORIDE 0.9 % IV SOLN
INTRAVENOUS | Status: DC
  Administered 2015-03-09 – 2015-03-10 (×2): via INTRAVENOUS

## 2015-03-09 NOTE — H&P (View-Only) (Signed)
[name] Reason for Consult: Needs a flexible sigmoidoscopy for an abnormal CT scan. Referring Physician: Dr. Markus Sanchez is an 58 y.o. female.  HPI: 59 year old black female, with multiple medical problems listed below, admitted with low back pain, has been having fevers of unknown origin. She developed some diarrhea recently and stool studies are pending. She usually has 1 BM per day with no history of melena or hematochezia. She has a good appetite and her weight has been stable. She is wheelchair bound and lives with her mother. She has never seen a gastroenterologist and has never had a colonoscopy. She denies having any GI issues besides the diarrhea.CT scan of the abdomen and pelvis revealed rectal wall thickening ?proctitis.     Past Medical History  Diagnosis Date  . MS (multiple sclerosis)   . HTN (hypertension)   . DM II (diabetes mellitus, type II), controlled   . Lymphedema   . Anemia   . Gait disorder    History reviewed. No pertinent past surgical history.  Family History  Problem Relation Age of Onset  . Multiple sclerosis Mother    Social History:  reports that she has quit smoking. Her smoking use included Cigarettes. She has never used smokeless tobacco. She reports that she does not drink alcohol or use illicit drugs.  Allergies:  Allergies  Allergen Reactions  . Sulfa Antibiotics Shortness Of Breath and Swelling   Medications: I have reviewed the patient's current medications.  Results for orders placed or performed during the hospital encounter of 03/01/15 (from the past 48 hour(s))  Glucose, capillary     Status: Abnormal   Collection Time: 03/06/15 10:17 PM  Result Value Ref Range   Glucose-Capillary 152 (H) 65 - 99 mg/dL  Glucose, capillary     Status: Abnormal   Collection Time: 03/07/15  7:52 AM  Result Value Ref Range   Glucose-Capillary 144 (H) 65 - 99 mg/dL   Comment 1 Notify RN   Glucose, capillary     Status: Abnormal    Collection Time: 03/07/15 12:26 PM  Result Value Ref Range   Glucose-Capillary 139 (H) 65 - 99 mg/dL   Comment 1 Notify RN   Glucose, capillary     Status: Abnormal   Collection Time: 03/07/15  4:16 PM  Result Value Ref Range   Glucose-Capillary 163 (H) 65 - 99 mg/dL   Comment 1 Notify RN   Glucose, capillary     Status: Abnormal   Collection Time: 03/07/15  9:58 PM  Result Value Ref Range   Glucose-Capillary 128 (H) 65 - 99 mg/dL  Glucose, capillary     Status: None   Collection Time: 03/08/15  7:39 AM  Result Value Ref Range   Glucose-Capillary 96 65 - 99 mg/dL  Glucose, capillary     Status: Abnormal   Collection Time: 03/08/15 12:02 PM  Result Value Ref Range   Glucose-Capillary 108 (H) 65 - 99 mg/dL  Glucose, capillary     Status: Abnormal   Collection Time: 03/08/15  5:49 PM  Result Value Ref Range   Glucose-Capillary 118 (H) 65 - 99 mg/dL   Ct Chest W Contrast  03/07/2015   CLINICAL DATA:  Septic shock. Fever and elevated white blood cell count.  EXAM: CT CHEST, ABDOMEN, AND PELVIS WITH CONTRAST  TECHNIQUE: Multidetector CT imaging of the chest, abdomen and pelvis was performed following the standard protocol during bolus administration of intravenous contrast.  CONTRAST:  47mL OMNIPAQUE IOHEXOL  300 MG/ML SOLN, 110mL OMNIPAQUE IOHEXOL 300 MG/ML SOLN  COMPARISON:  None.  FINDINGS: CT CHEST FINDINGS  Chest wall: No breast masses, supraclavicular or axillary lymphadenopathy. Small scattered lymph nodes are noted. The thyroid gland appears normal. The bony thorax is intact. No destructive bone lesions or spinal canal compromise.  Mediastinum: The heart is normal in size. No pericardial effusion. The aorta is normal in caliber. No dissection. Moderate scattered atherosclerotic calcifications. Three-vessel coronary artery calcifications are noted. Accessory hemi azygos vein is noted on the left. No mediastinal or hilar mass or adenopathy. The esophagus is grossly normal.  Lungs/pleura:  Moderate bilateral pleural effusions with overlying atelectasis. No pulmonary edema or worrisome pulmonary lesions. No definite infiltrates.  CT ABDOMEN AND PELVIS FINDINGS  Hepatobiliary: No focal hepatic lesions or intrahepatic biliary dilatation. Possible mild gallbladder wall thickening but no pericholecystic fluid. No common bile duct dilatation.  Pancreas: No mass, inflammation or ductal dilatation.  Spleen: Normal size.  No focal lesions.  Adrenals/Urinary Tract: The adrenal glands and kidneys are unremarkable. A right renal calculus is noted. No obstructing ureteral calculi. No renal mass or evidence of pyelonephritis.  Stomach/Bowel: The stomach, duodenum, small bowel and colon are grossly normal. No inflammatory changes, mass lesions or lymphadenopathy. Presacral and extensive perirectal soft tissue thickening suspicious for severe proctitis. The appendix is normal.  Vascular/Lymphatic: No mesenteric or retroperitoneal mass or adenopathy. Small scattered lymph nodes are noted. Advanced atherosclerotic calcifications involving the aorta and branch vessels but no dissection or focal aneurysm.  Other: No pelvic mass or lymphadenopathy. There are uterine fibroids noted. The bladder contains a Foley catheter. Areas noted in the bladder. No pelvic adenopathy or inguinal adenopathy.  There is diffuse subcutaneous soft tissue swelling/edema/ fluid which may suggest anasarca are cellulitis. There is a small amount of free abdominal and free pelvic fluid.  Musculoskeletal: Severe scoliosis. No acute bony findings or destructive bony changes.  IMPRESSION: 1. Moderate bilateral pleural effusions with overlying atelectasis but no worrisome pulmonary lesions or definite infiltrates. 2. Marked diffuse rectal wall thickening suggesting severe proctitis. No ulcer, mass or perforation. 3. Advanced atherosclerotic calcifications involving the aorta and branch vessels. 4. Right renal calculus but no obstructing ureteral  calculi. 5. No abdominal/pelvic mass or adenopathy. 6. Uterine fibroids. 7. Diffuse subcutaneous soft tissue swelling/edema/fluid.   Electronically Signed   By: Jocelyn Sanchez M.D.   On: 03/07/2015 14:18   Ct Abdomen Pelvis W Contrast  03/07/2015   CLINICAL DATA:  Septic shock. Fever and elevated white blood cell count.  EXAM: CT CHEST, ABDOMEN, AND PELVIS WITH CONTRAST  TECHNIQUE: Multidetector CT imaging of the chest, abdomen and pelvis was performed following the standard protocol during bolus administration of intravenous contrast.  CONTRAST:  57mL OMNIPAQUE IOHEXOL 300 MG/ML SOLN, 11mL OMNIPAQUE IOHEXOL 300 MG/ML SOLN  COMPARISON:  None.  FINDINGS: CT CHEST FINDINGS  Chest wall: No breast masses, supraclavicular or axillary lymphadenopathy. Small scattered lymph nodes are noted. The thyroid gland appears normal. The bony thorax is intact. No destructive bone lesions or spinal canal compromise.  Mediastinum: The heart is normal in size. No pericardial effusion. The aorta is normal in caliber. No dissection. Moderate scattered atherosclerotic calcifications. Three-vessel coronary artery calcifications are noted. Accessory hemi azygos vein is noted on the left. No mediastinal or hilar mass or adenopathy. The esophagus is grossly normal.  Lungs/pleura: Moderate bilateral pleural effusions with overlying atelectasis. No pulmonary edema or worrisome pulmonary lesions. No definite infiltrates.  CT ABDOMEN AND PELVIS FINDINGS  Hepatobiliary:  No focal hepatic lesions or intrahepatic biliary dilatation. Possible mild gallbladder wall thickening but no pericholecystic fluid. No common bile duct dilatation.  Pancreas: No mass, inflammation or ductal dilatation.  Spleen: Normal size.  No focal lesions.  Adrenals/Urinary Tract: The adrenal glands and kidneys are unremarkable. A right renal calculus is noted. No obstructing ureteral calculi. No renal mass or evidence of pyelonephritis.  Stomach/Bowel: The stomach,  duodenum, small bowel and colon are grossly normal. No inflammatory changes, mass lesions or lymphadenopathy. Presacral and extensive perirectal soft tissue thickening suspicious for severe proctitis. The appendix is normal.  Vascular/Lymphatic: No mesenteric or retroperitoneal mass or adenopathy. Small scattered lymph nodes are noted. Advanced atherosclerotic calcifications involving the aorta and branch vessels but no dissection or focal aneurysm.  Other: No pelvic mass or lymphadenopathy. There are uterine fibroids noted. The bladder contains a Foley catheter. Areas noted in the bladder. No pelvic adenopathy or inguinal adenopathy.  There is diffuse subcutaneous soft tissue swelling/edema/ fluid which may suggest anasarca are cellulitis. There is a small amount of free abdominal and free pelvic fluid.  Musculoskeletal: Severe scoliosis. No acute bony findings or destructive bony changes.  IMPRESSION: 1. Moderate bilateral pleural effusions with overlying atelectasis but no worrisome pulmonary lesions or definite infiltrates. 2. Marked diffuse rectal wall thickening suggesting severe proctitis. No ulcer, mass or perforation. 3. Advanced atherosclerotic calcifications involving the aorta and branch vessels. 4. Right renal calculus but no obstructing ureteral calculi. 5. No abdominal/pelvic mass or adenopathy. 6. Uterine fibroids. 7. Diffuse subcutaneous soft tissue swelling/edema/fluid.   Electronically Signed   By: Jocelyn Sanchez M.D.   On: 03/07/2015 14:18   Dg Chest Port 1 View  03/07/2015   CLINICAL DATA:  Patient is febrile.  EXAM: PORTABLE CHEST - 1 VIEW  COMPARISON:  03/01/2015  FINDINGS: The cardiac silhouette is normal for portable technique. There has been interval development of bilateral pleural effusions and bibasilar airspace consolidation versus atelectasis. No evidence of pneumothorax. Soft tissues and osseous structures are without acute abnormality.  IMPRESSION: Interval development of bilateral  pleural effusions and bilateral lower lobes airspace consolidation versus atelectasis. This represents an acute change from the previous radiograph dated 03/01/2015   Electronically Signed   By: Fidela Salisbury M.D.   On: 03/07/2015 10:35   Review of Systems  Constitutional: Positive for fever and malaise/fatigue. Negative for chills, weight loss and diaphoresis.  HENT: Negative.   Eyes: Negative.   Cardiovascular: Positive for leg swelling.  Gastrointestinal: Positive for diarrhea. Negative for heartburn, nausea, vomiting and abdominal pain.  Musculoskeletal: Positive for back pain and joint pain.  Skin: Negative.   Neurological: Positive for weakness.  Psychiatric/Behavioral: Negative for depression, suicidal ideas, hallucinations, memory loss and substance abuse. The patient is not nervous/anxious and does not have insomnia.    Blood pressure 114/65, pulse 91, temperature 100.5 F (38.1 C), temperature source Oral, resp. rate 18, height 5\' 3"  (1.6 m), weight 86.6 kg (190 lb 14.7 oz), SpO2 100 %. Physical Exam  Constitutional: She is oriented to person, place, and time. She appears well-developed and well-nourished.  HENT:  Head: Normocephalic and atraumatic.  Eyes: Conjunctivae are normal.  Cardiovascular: Normal rate and regular rhythm.   Respiratory: Effort normal and breath sounds normal.  GI: Soft. Bowel sounds are normal. She exhibits no distension and no mass. There is no tenderness. There is no rebound and no guarding.  Neurological: She is alert and oriented to person, place, and time.  Skin: Skin is warm and dry.  Psychiatric: She has a normal mood and affect. Her behavior is normal. Judgment and thought content normal.   Assessment/Plan: 1) Fever of unknown origin with abnormal CT scan-thickening of the rectal wall on CT. Will plan a flexible sigmoidoscopy tomorrow. Will give enemas in the morning. 2) Anemia of chronic disease.  3) Multiple sclerosis diagnosed in 2001.   4) Quadriparesis.  5) Diabetes mellitus with PVD.  MANN,JYOTHI 03/08/2015, 6:41 PM

## 2015-03-09 NOTE — Consult Note (Signed)
WOC wound consult note Reason for Consult:Patient with MS and long standing history of lymphedema adn pressure injury over area that had been previously healed.  Has indwelling catheter for urine and is incontinence of stool. Wound type: Friction injuries to posterior LLE (new) Pressure Ulcer POA: No Measurement:5cm x 2cm area with three distinct lesions, the largest measures 2cm x 1cm x 0.2cm. The smallest is nearly completely reepithelialized and the other is 50% reepithelialized. Wound UYE:BXID, moist granulating Drainage (amount, consistency, odor) scant serous Periwound:dry, intact with edema consistent with lymphedema Dressing procedure/placement/frequency:I will add a petrolatum dressing to retain moisture and a cover dressing to reduce friction in this area. Prevalon boots are in place.  I will add a pressure redistribution mattress with low air loss feature; she had this in ICU and it is still indicated for pressure ulcer prevention. [place] nursing team will not follow, but will remain available to this patient, the nursing and medical teams.  Please re-consult if needed. Thanks, Maudie Flakes, MSN, RN, [place], [place], [place] [phone])

## 2015-03-09 NOTE — Interval H&P Note (Signed)
History and Physical Interval Note:  03/09/2015 3:14 PM  Jocelyn Sanchez  has presented today for surgery, with the diagnosis of Diarrhea with abnormal CT scan  The various methods of treatment have been discussed with the patient and family. After consideration of risks, benefits and other options for treatment, the patient has consented to  Procedure(s): FLEXIBLE SIGMOIDOSCOPY (Left) as a surgical intervention .  The patient's history has been reviewed, patient examined, no change in status, stable for surgery.  I have reviewed the patient's chart and labs.  Questions were answered to the patient's satisfaction.     HUNG,PATRICK D

## 2015-03-09 NOTE — Progress Notes (Signed)
Patient ID: Jocelyn Sanchez, female   DOB: [birth year], 59 y.o.   MRN: [id number]         [place] [facility]    Date of Admission:  03/01/2015   Total days of antibiotics 8        Day 2 ceftazidime        Day 2 metronidazole         Principal Problem:   Septic shock Active Problems:   E. coli UTI (urinary tract infection)   Citrobacter infection   Fever   Hypokalemia   Multiple sclerosis   Sepsis due to Escherichia coli with acute renal failure   Diabetes mellitus with peripheral vascular disease   Leukocytosis   Hypophosphatemia   Hypomagnesemia   Anemia of chronic disease   Decubitus ulcer, stage 2   Lymphedema   Back pain   Proctitis   . baclofen  5 mg Oral TID  . cefTAZidime (FORTAZ)  IV  2 g Intravenous 3 times per day  . feeding supplement (PRO-STAT SUGAR FREE 64)  30 mL Oral BID  . heparin subcutaneous  5,000 Units Subcutaneous 3 times per day  . insulin aspart  0-5 Units Subcutaneous QHS  . insulin aspart  0-9 Units Subcutaneous TID WC  . metroNIDAZOLE  500 mg Oral 3 times per day  . midodrine  2.5 mg Oral BID WC  . pantoprazole  40 mg Oral Daily  . sodium chloride  3 mL Intravenous Q12H    Review of Systems: Review of systems not obtained due to patient factors.  Past Medical History  Diagnosis Date  . MS (multiple sclerosis)   . HTN (hypertension)   . DM II (diabetes mellitus, type II), controlled   . Lymphedema   . Anemia   . Gait disorder     Social History  Substance Use Topics  . Smoking status: Former Smoker    Types: Cigarettes  . Smokeless tobacco: Never Used  . Alcohol Use: No    Family History  Problem Relation Age of Onset  . Multiple sclerosis Mother    Allergies  Allergen Reactions  . Sulfa Antibiotics Shortness Of Breath and Swelling    OBJECTIVE: Filed Vitals:   03/09/15 1422 03/09/15 1430 03/09/15 1520 03/09/15 1530  BP:  126/54 129/71 132/63  Pulse: 93  97 91  Temp: 100.6 F (38.1 C)       TempSrc: Oral     Resp: 19  26 22   Height:      Weight:      SpO2: 95%      Body mass index is 33.75 kg/(m^2).  General: She is still out of her room in the endoscopy suite  Lab Results Lab Results  Component Value Date   WBC 18.3* 03/05/2015   HGB 10.6* 03/05/2015   HCT 30.7* 03/05/2015   MCV 81.2 03/05/2015   PLT 348 03/05/2015    Lab Results  Component Value Date   CREATININE 0.39* 03/05/2015   BUN 8 03/05/2015   NA 136 03/05/2015   K 4.0 03/05/2015   CL 105 03/05/2015   CO2 22 03/05/2015    Lab Results  Component Value Date   ALT 8* 03/03/2015   AST 15 03/03/2015   ALKPHOS 73 03/03/2015   BILITOT 0.6 03/03/2015     Microbiology: Recent Results (from the past 240 hour(s))  Culture, blood (x 2)     Status: None   Collection Time: 03/01/15  1:51 PM  Result  Value Ref Range Status   Specimen Description BLOOD LEFT ARM  Final   Special Requests IN PEDIATRIC BOTTLE 3CC  Final   Culture   Final    NO GROWTH 5 DAYS Performed at [facility]    Report Status 03/06/2015 FINAL  Final  Culture, blood (x 2)     Status: None   Collection Time: 03/01/15  1:51 PM  Result Value Ref Range Status   Specimen Description BLOOD RIGHT ARM  Final   Special Requests BOTTLES DRAWN AEROBIC AND ANAEROBIC 10CC  Final   Culture  Setup Time   Final    GRAM NEGATIVE RODS IN BOTH AEROBIC AND ANAEROBIC BOTTLES CRITICAL RESULT CALLED TO, READ BACK BY AND VERIFIED WITH: C CREECH@0650  03/02/15    Culture   Final    CITROBACTER SPECIES Performed at [facility]    Report Status 03/05/2015 FINAL  Final   Organism ID, Bacteria CITROBACTER SPECIES  Final      Susceptibility   Citrobacter species - MIC*    CEFAZOLIN >=64 RESISTANT Resistant     CEFEPIME <=1 SENSITIVE Sensitive     CEFTAZIDIME <=1 SENSITIVE Sensitive     CEFTRIAXONE <=1 SENSITIVE Sensitive     CIPROFLOXACIN <=0.25 SENSITIVE Sensitive     GENTAMICIN <=1 SENSITIVE Sensitive     IMIPENEM 2 SENSITIVE  Sensitive     TRIMETH/SULFA <=20 SENSITIVE Sensitive     PIP/TAZO <=4 SENSITIVE Sensitive     * CITROBACTER SPECIES  Urine culture     Status: None   Collection Time: 03/01/15  7:23 PM  Result Value Ref Range Status   Specimen Description URINE, CATHETERIZED  Final   Special Requests NONE  Final   Culture   Final    80,000 COLONIES/ml ESCHERICHIA COLI Performed at [facility]    Report Status 03/04/2015 FINAL  Final   Organism ID, Bacteria ESCHERICHIA COLI  Final      Susceptibility   Escherichia coli - MIC*    AMPICILLIN >=32 RESISTANT Resistant     CEFAZOLIN <=4 SENSITIVE Sensitive     CEFTRIAXONE <=1 SENSITIVE Sensitive     CIPROFLOXACIN >=4 RESISTANT Resistant     GENTAMICIN <=1 SENSITIVE Sensitive     IMIPENEM <=0.25 SENSITIVE Sensitive     NITROFURANTOIN <=16 SENSITIVE Sensitive     TRIMETH/SULFA <=20 SENSITIVE Sensitive     AMPICILLIN/SULBACTAM >=32 RESISTANT Resistant     PIP/TAZO 8 SENSITIVE Sensitive     * 80,000 COLONIES/ml ESCHERICHIA COLI  MRSA PCR Screening     Status: Abnormal   Collection Time: 03/01/15  9:34 PM  Result Value Ref Range Status   MRSA by PCR POSITIVE (A) NEGATIVE Final    Comment:        The GeneXpert MRSA Assay (FDA approved for NASAL specimens only), is one component of a comprehensive MRSA colonization surveillance program. It is not intended to diagnose MRSA infection nor to guide or monitor treatment for MRSA infections. RESULT CALLED TO, READ BACK BY AND VERIFIED WITH: M.REEVES,RN AT 2334 ON 03/02/15 BY W.SHEA      ASSESSMENT: Her flexible sigmoidoscopy today did not reveal any abnormalities. Her fever has come down following the broadening of her antibiotics yesterday. Her nurse told me that Jocelyn Sanchez has not had any more diarrhea.  PLAN: 1. Continue current antibiotics  Jocelyn Bickers, MD [facility] [phone] pager   [phone] cell 03/09/2015, 4:02 PM

## 2015-03-09 NOTE — Progress Notes (Signed)
Patient ID: Jocelyn Sanchez, female   DOB: 1956-01-16, 59 y.o.   MRN: [id number] [facility] PROGRESS NOTE  DESIRAE MANCUSI DJT:[id number] DOB: [birth year] DOA: 03/01/2015 PCP: [name], MD  Brief narrative:    59 y.o. female with past medical history of multiple sclerosis, diabetes mellitus type 2, hypertension, lower extremity lymphedema who presented to [facility] ED 03/01/15 with initial concern for low back pain. She was subsequently founds to have citrobacter bacteremia and E.Coli UTI and has developed a septic shock. She was on PCCM service through 9/16 and transferred to [facility] 9/17.  SIGNIFICANT EVENTS  9/12 - Admission to hospital 9/12 - Transfusion of PRBC 9/13 - Radial arterial line placed. 9/14 - Radial arterial line discontinued. 9/17 - Care transitioned to [facility] 9/18 - Spiking fevers. CT abd and chest ordered for eval of fever - severe proctitis 9/19 - Consulted GI and ID. GI plans flex sigmoidoscopy. Broadened abx coverage to ceftazidime and added flagyl.  Anticipated discharge: Low grade fever overnight. Plan for flex sig today.   Assessment/Plan:    Principal Problem: Septic shock secondary to E coli UTI and Citrobacter bacteremia / Severe sepsis  - Patient presented with septic shock secondary to E.Coli UTI and Citrobacter bacteremia - She has had TTE which was negative for vegetations. - Please note, unable to repeat blood cultures or blood work because of difficult IV access because of significant lymphedema. Unable to place PICC and pt refused IJ placement.  - Due to ongoing fevers, fever work up initiated (CXR, CT chest and abd). Findings suggestive of severe proctitis - GI and ID consulted, we appreciate their input very much - Abx changed to fortaz and flagyl added. Rocephin stopped 03/08/2015.   Active Problems: Severe proctitis - Seen on CT abdomen and pelvis ordered as part of the fever workup. Pt found to have severe proctitis. - Plan for flexible  sigmoidoscopy today per GI - Added flagyl to abx regimen, 03/08/2015.   Acute renal failure complicated by non anion gap metabolic acidosis / Hyperkalemia / Hypokalemia  - Secondary to septic shock - Creatinine has improved with treatment for sepsis - Further blood work not done due to difficult IV access so unable to monitor electrolyte balance   Hypophosphatemia / Hypomagnesemia - Supplemented - Unable ot monitor since pt refused blood work. SHe has difficult IV access due to lymphedema  Chronic lymphedema with sacral decubitus ulcer, stage 2 - Appreciate wound care assessment on  03/02/2015 - Patient with long-standing lymphedema and recurrence of pressure injury over area that had been previously healed. Pt also has indwelling urinary catheter and is incontinent of stool. - Per wound care, wound type: moisture associated skin damage plus pressure and friction in an area of previous ulceration. Measurement:1.5cm x 2cm x 0.2cm Stage 2 in an area measuring 6cm x 8cm of scar tissue  - Dressing recommendations: A soft silicone foam dressing is provided; bilateral pressure redistribution heel boots provided to prevent injury in the presence of the weight and immobility resulting from the lymphedema.   Diabetes mellitus type 2 with peripheral vascular manifestations - A1c in 11/2014 - 6.7 indicating good glycemic control - Continue SSI - CBG's in past 24 hours: 108, 118, 133  Anemia of chronic disease  - Secondary to multiple sclerosis - S/p 2 units of PRBCs on 03/01/15  Multiple sclerosis with quadriparesis   - Not on treatment and does not follow with neurology.   Acute low back pain. - Lumbar spine x-ray without acute  findings.  - Improved    DVT Prophylaxis  - Heparin subQ in hospital    Code Status: Full.  Family Communication:  plan of care discussed with the patient Disposition Plan: continuing treatment for sepsis; now has severe proctitis, plan for flex sig today.   IV  access:  Peripheral IV  Procedures and diagnostic studies:    Dg Chest Port 1 View 2015-03-24    Interval development of bilateral pleural effusions and bilateral lower lobes airspace consolidation versus atelectasis. This represents an acute change from the previous radiograph dated 03/01/2015   Electronically Signed   By: Fidela Salisbury M.D.   On: March 24, 2015 10:35  Dg Lumbar Spine 2-3 Views 03/01/2015  1. Considerable lumbar spondylosis, scoliosis, and degenerative disc disease with mild subluxations at L3-4 and L4-5. The scoliotic curvature oblique and indistinct on the lateral projection. I do not see a definite fracture. MRI (or CT) would be a superior way to assess the lumbar spine for impingement given the limitations due to the degree of scoliosis.   Electronically Signed   By: Van Clines M.D.   On: 03/01/2015 18:39   Dg Chest Portable 1 View 03/01/2015   No acute cardiopulmonary disease.   Electronically Signed   By: Lajean Manes M.D.   On: 03/01/2015 14:49    Medical Consultants:  Cardiology PCCM consulted and then primary through 9/16 Infectious disease, Dr. Michel Bickers Gastroenterology, Dr. Tyrone Sage  Other Consultants:  PT evaluation Nutrition WOC  IAnti-Infectives:   Vanco and zosyn stopped 03/05/2015 Rocephin 03/05/2015 --> 03/08/2015  Flagyl 03/08/2015 --> Tressie Ellis 03/08/2015 -->    Leisa Lenz, MD  [facility] Pager [phone]  Time spent in minutes: 25 minutes  If 7PM-7AM, please contact night-coverage [email or web address] Password [email or web address] 03/09/2015, 6:50 AM   LOS: 8 days    HPI/Subjective: No acute overnight events. Patient reports pain is controlled.  Objective: Filed Vitals:   03/08/15 0108 03/08/15 0521 03/08/15 2201 03/09/15 0530  BP:  114/65 127/54 122/60  Pulse:  91 86 95  Temp: 100.4 F (38 C) 100.5 F (38.1 C) 99.5 F (37.5 C) 100.1 F (37.8 C)  TempSrc: Oral Oral Oral Oral  Resp:  18 18 18   Height:      Weight:  86.6 kg (190 lb  14.7 oz)  86.4 kg (190 lb 7.6 oz)  SpO2:  100% 97% 97%    Intake/Output Summary (Last 24 hours) at 03/09/15 0650 Last data filed at 03/09/15 0530  Gross per 24 hour  Intake    440 ml  Output   1400 ml  Net   -960 ml    Exam:   General:  Pt is alert, no distress  Cardiovascular: RRR, appreciate S1, S2   Respiratory: bilateral air entry, no wheezing   Abdomen:  (+) BS, non tender to palpation  Extremities: lymphedema throughout UE and LE, palpable pulses   Neuro: No focal deficits   Data Reviewed: Basic Metabolic Panel:  Recent Labs Lab 03/03/15 0525 03/04/15 0320 03/05/15 0030  NA 142 139 136  K 2.8* 3.7 4.0  CL 110 105 105  CO2 24 25 22   GLUCOSE 154* 154* 123*  BUN 21* 13 8  CREATININE 0.54 0.51 0.39*  CALCIUM 7.6* 7.8* 7.5*  MG  --  1.3* 1.5*  PHOS  --  1.1* 2.3*   Liver Function Tests:  Recent Labs Lab 03/03/15 0525 03/04/15 0320 03/05/15 0030  AST 15  --   --   ALT 8*  --   --  ALKPHOS 73  --   --   BILITOT 0.6  --   --   PROT 5.3*  --   --   ALBUMIN 1.8* 1.9* 1.7*   No results for input(s): LIPASE, AMYLASE in the last 168 hours. No results for input(s): AMMONIA in the last 168 hours. CBC:  Recent Labs Lab 03/03/15 0525 03/04/15 0320 03/05/15 0355  WBC 30.9* 35.3* 18.3*  NEUTROABS  --  31.7* 14.1*  HGB 9.7* 10.5* 10.6*  HCT 27.9* 30.0* 30.7*  MCV 80.4 80.9 81.2  PLT 339 379 348   Cardiac Enzymes: No results for input(s): CKTOTAL, CKMB, CKMBINDEX, TROPONINI in the last 168 hours. BNP: Invalid input(s): POCBNP CBG:  Recent Labs Lab 03/07/15 2158 03/08/15 0739 03/08/15 1202 03/08/15 1749 03/08/15 2156  GLUCAP 128* 96 108* 118* 133*    Culture, blood (x 2)     Status: None   Collection Time: 03/01/15  1:51 PM  Result Value Ref Range Status   Specimen Description BLOOD LEFT ARM  Final   Special Requests IN PEDIATRIC BOTTLE 3CC  Final   Culture   Final    NO GROWTH 5 DAYS Performed at [facility]    Report  Status 03/06/2015 FINAL  Final  Culture, blood (x 2)     Status: None   Collection Time: 03/01/15  1:51 PM  Result Value Ref Range Status   Specimen Description BLOOD RIGHT ARM  Final   Special Requests BOTTLES DRAWN AEROBIC AND ANAEROBIC 10CC  Final   Culture  Setup Time   Final   Culture   Final   Report Status 03/05/2015 FINAL  Final   Organism ID, Bacteria CITROBACTER SPECIES  Final      Susceptibility   Citrobacter species - MIC*    CEFAZOLIN >=64 RESISTANT Resistant     CEFEPIME <=1 SENSITIVE Sensitive     CEFTAZIDIME <=1 SENSITIVE Sensitive     CEFTRIAXONE <=1 SENSITIVE Sensitive     CIPROFLOXACIN <=0.25 SENSITIVE Sensitive     GENTAMICIN <=1 SENSITIVE Sensitive     IMIPENEM 2 SENSITIVE Sensitive     TRIMETH/SULFA <=20 SENSITIVE Sensitive     PIP/TAZO <=4 SENSITIVE Sensitive     * CITROBACTER SPECIES  Urine culture     Status: None   Collection Time: 03/01/15  7:23 PM  Result Value Ref Range Status   Specimen Description URINE, CATHETERIZED  Final   Special Requests NONE  Final   Culture   Final   Report Status 03/04/2015 FINAL  Final   Organism ID, Bacteria ESCHERICHIA COLI  Final      Susceptibility   Escherichia coli - MIC*    AMPICILLIN >=32 RESISTANT Resistant     CEFAZOLIN <=4 SENSITIVE Sensitive     CEFTRIAXONE <=1 SENSITIVE Sensitive     CIPROFLOXACIN >=4 RESISTANT Resistant     GENTAMICIN <=1 SENSITIVE Sensitive     IMIPENEM <=0.25 SENSITIVE Sensitive     NITROFURANTOIN <=16 SENSITIVE Sensitive     TRIMETH/SULFA <=20 SENSITIVE Sensitive     AMPICILLIN/SULBACTAM >=32 RESISTANT Resistant     PIP/TAZO 8 SENSITIVE Sensitive     * 80,000 COLONIES/ml ESCHERICHIA COLI  MRSA PCR Screening     Status: Abnormal   Collection Time: 03/01/15  9:34 PM  Result Value Ref Range Status   MRSA by PCR POSITIVE (A) NEGATIVE Final     Scheduled Meds: . baclofen  5 mg Oral TID  . cefTRIAXone  2 g Intravenous Q24H  . heparin  subcutaneous  5,000 Units Subcutaneous 3 times  per day  . insulin aspart  0-5 Units Subcutaneous QHS  . insulin aspart  0-9 Units Subcutaneous TID WC  . midodrine  2.5 mg Oral BID WC  . pantoprazole  40 mg Oral Daily

## 2015-03-09 NOTE — Op Note (Signed)
[place], [address]   FLEXIBLE SIGMOIDOSCOPY PROCEDURE REPORT  PATIENT: Jocelyn, Sanchez  MR#: [id number] BIRTHDATE: September 16, 1955 , 31  yrs. old GENDER: female ENDOSCOPIST: Carol Ada, MD REFERRED BY: PROCEDURE DATE:  03/09/2015 PROCEDURE:   Sigmoidoscopy with biopsy ASA CLASS:   Class III INDICATIONS: Abnormal CT scan MEDICATIONS: None  DESCRIPTION OF PROCEDURE:   After the risks benefits and alternatives of the procedure were thoroughly explained, informed consent was obtained.  Digital exam revealed no abnormalities of the rectum. The     endoscope was introduced through the anus  and advanced to the sigmoid colon , The exam was Without limitations. The quality of the prep was The overall prep quality was good. . Estimated blood loss is zero unless otherwise noted in this procedure report. The instrument was then slowly withdrawn as the mucosa was fully examined.       FINDINGS: The mucosa in the rectum and the sigmoid colon were normal.  No evidence of any inflammation to correlate wtih the CT scan findings.  Cold biopsies were obtained to make sure that there is no sublte inflammation.    Retroflexed views revealed no abnormalities.    The scope was then withdrawn from the patient and the procedure terminated.  COMPLICATIONS: There were no immediate complications.  ENDOSCOPIC IMPRESSION: 1) Normal FFS.  RECOMMENDATIONS: 1) Follow up biopsy results.  REPEAT EXAM:  eSigned:  Carol Ada, MD 03/09/2015 3:32 PM   CC:

## 2015-03-10 DIAGNOSIS — I878 Other specified disorders of veins: Secondary | ICD-10-CM

## 2015-03-10 DIAGNOSIS — M7989 Other specified soft tissue disorders: Secondary | ICD-10-CM

## 2015-03-10 LAB — GLUCOSE, CAPILLARY
GLUCOSE-CAPILLARY: 118 mg/dL — AB (ref 65–99)
Glucose-Capillary: 104 mg/dL — ABNORMAL HIGH (ref 65–99)
Glucose-Capillary: 124 mg/dL — ABNORMAL HIGH (ref 65–99)
Glucose-Capillary: 101 mg/dL — ABNORMAL HIGH (ref 65–99)

## 2015-03-10 NOTE — Progress Notes (Addendum)
Patient ID: Jocelyn Sanchez, female   DOB: 12/29/1955, 59 y.o.   MRN: [id number] [facility] PROGRESS NOTE  ARRIE BORRELLI EXN:[id number] DOB: May 27, 1956 DOA: 03/01/2015 PCP: [name], MD  Brief narrative:    59 y.o. female with past medical history of multiple sclerosis, diabetes mellitus type 2, hypertension, lower extremity lymphedema who presented to [facility] ED 03/01/15 with initial concern for low back pain. She was subsequently founds to have citrobacter bacteremia and E.Coli UTI and has developed a septic shock. She was on PCCM service through 9/16 and transferred to [facility] 9/17.  SIGNIFICANT EVENTS  9/12 - Admission to hospital 9/12 - Transfusion of PRBC 9/13 - Radial arterial line placed. 9/14 - Radial arterial line discontinued. 9/17 - Care transitioned to [facility] 9/18 - Spiking fevers. CT abd and chest ordered for eval of fever - severe proctitis 9/19 - Consulted GI and ID. Broadened abx coverage to ceftazidime and added flagyl. 9/20 - Flex sigmoidoscopy done, no proctitis 9/21 - ID recommended stopping abx 9/22  Anticipated discharge: Low grade fever overnight. Continue abx through 9/22. Discharge likely to SNF if she defervesce.  Assessment/Plan:    Principal Problem: Septic shock secondary to E coli UTI and Citrobacter bacteremia / Severe sepsis  - Patient presented with septic shock secondary to E.Coli UTI and Citrobacter bacteremia. She has had TTE done during this hospital stay which demonstrated no vegetations. She was under PCCM care through 9/16 and transitioned to [facility] 03/06/15. At this point she was already on Rocephin recomended by PCCM for targeted treatment of E.Coli UTI and Citrobacter bacteremia.  - Could not repeat blood cultures or blood work because of difficult IV access due to significant lymphedema. Unable to place PICC and pt refused IJ line. - Because of ongoing fevers, fever work up initiated 9/18 (CXR, CT chest and abd). Findings suggestive of severe  proctitis. ID recommended adding flagyl and changing rocephin to [facility] 03/08/2015.  - Flex sigmoidoscopy done  9/20 but proctitis not seen.  - Per ID, stop antibiotics 03/11/2015 and monitor fever curve. As of now she is still spiking fevers with T max 101.7 F in past 24 hours.   Active Problems: Severe proctitis - Seen on CT abdomen and pelvis ordered as part of the fever workup. Pt found to have severe proctitis but on flexible sigmoidoscopy done 9/20 proctitis was not seen - She is currently on ceftazidime and flagyl. ID recommended stopping Abx tomorrow 9/22.  Acute renal failure complicated by non anion gap metabolic acidosis / Hyperkalemia / Hypokalemia  - Secondary to septic shock - Creatinine has improved with treatment for sepsis but unable to [facility] further since pt refused blood work. She has lymphedema throughout and has difficult IV access to the point even PICC line could not be inserted. She refused to have IJ line.   Hypophosphatemia / Hypomagnesemia - Supplemented - Unable ot monitor further since pt refused blood work.  - Pt has difficult IV access due to lymphedema.  Chronic lymphedema with sacral decubitus ulcer, stage 2 - Patient with long-standing lymphedema and recurrence of pressure injury over area that had been previously healed. Pt also has indwelling urinary catheter and is incontinent of stool. - Per wound care assessment: moisture associated skin damage plus pressure and friction in an area of previous ulceration. Measurement:1.5cm x 2cm x 0.2cm Stage 2 in an area measuring 6cm x 8cm of scar tissue  - Dressing recommendations: A soft silicone foam dressing provided; bilateral pressure redistribution heel boots provided  to prevent injury in the presence of the weight and immobility resulting from the lymphedema.   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   Acute low back pain. - No acute findings seen on lumbar spine x-ray - Improved    DVT Prophylaxis  - Heparin subQ ordered    Code Status: Full.  Family Communication:  plan of care discussed with the patient Disposition Plan: possibly by the end of the week, 03/13/2015.  IV access:  Peripheral IV  Procedures and diagnostic studies:    Dg Chest Port 1 View 03-15-15    Interval development of bilateral pleural effusions and bilateral lower lobes airspace consolidation versus atelectasis. This represents an acute change from the previous radiograph dated 03/01/2015   Electronically Signed   By: Fidela Salisbury M.D.   On: 03-15-2015 10:35  Dg Lumbar Spine 2-3 Views 03/01/2015  1. Considerable lumbar spondylosis, scoliosis, and degenerative disc disease with mild subluxations at L3-4 and L4-5. The scoliotic curvature oblique and indistinct on the lateral projection. I do not see a definite fracture. MRI (or CT) would be a superior way to assess the lumbar spine for impingement given the limitations due to the degree of scoliosis.   Electronically Signed   By: Van Clines M.D.   On: 03/01/2015 18:39   Dg Chest Portable 1 View 03/01/2015   No acute cardiopulmonary disease.   Electronically Signed   By: Lajean Manes M.D.   On: 03/01/2015 14:49    Medical Consultants:  Cardiology PCCM consulted and then primary through 9/16 Infectious disease, Dr. Michel Bickers Gastroenterology, Dr. Tyrone Sage  Other Consultants:  PT evaluation Nutrition WOC  IAnti-Infectives:   Vanco and zosyn stopped 03/05/2015 Rocephin 03/05/2015 --> 03/08/2015  Flagyl 03/08/2015 --> Tressie Ellis 03/08/2015 -->    Leisa Lenz, MD  [facility] Pager [phone]  Time spent in  minutes: 25 minutes  If 7PM-7AM, please contact night-coverage [email or web address] Password [email or web address] 03/10/2015, 1:20 PM   LOS: 9 days    HPI/Subjective: No acute overnight events. Patient reports feeling better.   Objective: Filed Vitals:   03/09/15 1625 03/09/15 2129 03/10/15 0500 03/10/15 0558  BP: 124/63 98/56  126/58  Pulse: 87 91  91  Temp: 99.4 F (37.4 C) 99.2 F (37.3 C)  101.7 F (38.7 C)  TempSrc: Oral Oral  Oral  Resp: 20 20  20   Height:      Weight:   86 kg (189 lb 9.5 oz) 85.2 kg (187 lb 13.3 oz)  SpO2: 99% 97%  95%    Intake/Output Summary (Last 24 hours) at 03/10/15 1320 Last data filed at 03/10/15 0835  Gross per 24 hour  Intake 871.66 ml  Output   1300 ml  Net -428.34 ml    Exam:   General:  Pt is alert, awake, no acute distress   Cardiovascular: Regular rate and rhythm (+) S1, S2   Respiratory: no wheezing, no rhonchi   Abdomen:  Nontender, (+) BS  Extremities: lymphedema throughout, palpable pulses bilaterally   Neuro: Nonfocal   Data Reviewed: Basic Metabolic Panel:  Recent Labs Lab 03/04/15 0320 03/05/15 0030  NA 139 136  K  3.7 4.0  CL 105 105  CO2 25 22  GLUCOSE 154* 123*  BUN 13 8  CREATININE 0.51 0.39*  CALCIUM 7.8* 7.5*  MG 1.3* 1.5*  PHOS 1.1* 2.3*   Liver Function Tests:  Recent Labs Lab 03/04/15 0320 03/05/15 0030  ALBUMIN 1.9* 1.7*   No results for input(s): LIPASE, AMYLASE in the last 168 hours. No results for input(s): AMMONIA in the last 168 hours. CBC:  Recent Labs Lab 03/04/15 0320 03/05/15 0355  WBC 35.3* 18.3*  NEUTROABS 31.7* 14.1*  HGB 10.5* 10.6*  HCT 30.0* 30.7*  MCV 80.9 81.2  PLT 379 348   Cardiac Enzymes: No results for input(s): CKTOTAL, CKMB, CKMBINDEX, TROPONINI in the last 168 hours. BNP: Invalid input(s): POCBNP CBG:  Recent Labs Lab 03/09/15 1148 03/09/15 1759 03/09/15 2307 03/10/15 0814 03/10/15 1201  GLUCAP 111* 115* 115* 104* 118*   Results for orders placed or  performed during the hospital encounter of 03/01/15  Culture, blood (x 2)     Status: None   Collection Time: 03/01/15  1:51 PM  Result Value Ref Range Status   Specimen Description BLOOD LEFT ARM  Final   Special Requests IN PEDIATRIC BOTTLE 3CC  Final   Culture   Final    NO GROWTH 5 DAYS Performed at [facility]    Report Status 03/06/2015 FINAL  Final  Culture, blood (x 2)     Status: None   Collection Time: 03/01/15  1:51 PM  Result Value Ref Range Status   Specimen Description BLOOD RIGHT ARM  Final   Special Requests BOTTLES DRAWN AEROBIC AND ANAEROBIC 10CC  Final   Culture  Setup Time   Final    GRAM NEGATIVE RODS IN BOTH AEROBIC AND ANAEROBIC BOTTLES CRITICAL RESULT CALLED TO, READ BACK BY AND VERIFIED WITH: C CREECH@0650  03/02/15    Culture   Final    CITROBACTER SPECIES Performed at [facility]    Report Status 03/05/2015 FINAL  Final   Organism ID, Bacteria CITROBACTER SPECIES  Final      Susceptibility   Citrobacter species - MIC*    CEFAZOLIN >=64 RESISTANT Resistant     CEFEPIME <=1 SENSITIVE Sensitive     CEFTAZIDIME <=1 SENSITIVE Sensitive     CEFTRIAXONE <=1 SENSITIVE Sensitive     CIPROFLOXACIN <=0.25 SENSITIVE Sensitive     GENTAMICIN <=1 SENSITIVE Sensitive     IMIPENEM 2 SENSITIVE Sensitive     TRIMETH/SULFA <=20 SENSITIVE Sensitive     PIP/TAZO <=4 SENSITIVE Sensitive     * CITROBACTER SPECIES  Urine culture     Status: None   Collection Time: 03/01/15  7:23 PM  Result Value Ref Range Status   Specimen Description URINE, CATHETERIZED  Final   Special Requests NONE  Final   Culture   Final    80,000 COLONIES/ml ESCHERICHIA COLI Performed at [facility]    Report Status 03/04/2015 FINAL  Final   Organism ID, Bacteria ESCHERICHIA COLI  Final      Susceptibility   Escherichia coli - MIC*    AMPICILLIN >=32 RESISTANT Resistant     CEFAZOLIN <=4 SENSITIVE Sensitive     CEFTRIAXONE <=1 SENSITIVE Sensitive     CIPROFLOXACIN  >=4 RESISTANT Resistant     GENTAMICIN <=1 SENSITIVE Sensitive     IMIPENEM <=0.25 SENSITIVE Sensitive     NITROFURANTOIN <=16 SENSITIVE Sensitive     TRIMETH/SULFA <=20 SENSITIVE Sensitive     AMPICILLIN/SULBACTAM >=32 RESISTANT Resistant  PIP/TAZO 8 SENSITIVE Sensitive     * 80,000 COLONIES/ml ESCHERICHIA COLI  MRSA PCR Screening     Status: Abnormal   Collection Time: 03/01/15  9:34 PM  Result Value Ref Range Status   MRSA by PCR POSITIVE (A) NEGATIVE Final    . baclofen  5 mg Oral TID  . cefTAZidime (FORTAZ)  IV  2 g Intravenous 3 times per day  . feeding supplement (PRO-STAT SUGAR FREE 64)  30 mL Oral BID  . heparin subcutaneous  5,000 Units Subcutaneous 3 times per day  . insulin aspart  0-5 Units Subcutaneous QHS  . insulin aspart  0-9 Units Subcutaneous TID WC  . metroNIDAZOLE  500 mg Oral 3 times per day  . midodrine  2.5 mg Oral BID WC  . pantoprazole  40 mg Oral Daily  . sodium chloride  3 mL Intravenous Q12H

## 2015-03-10 NOTE — Progress Notes (Signed)
Patient ID: Jocelyn Sanchez, female   DOB: 02/28/1956, 59 y.o.   MRN: [id number]         [facility]    Date of Admission:  03/01/2015   Total days of antibiotics 9        Day 3 ceftazidime        Day 3 metronidazole          Principal Problem:   Septic shock Active Problems:   E. coli UTI (urinary tract infection)   Citrobacter infection   Fever   Hypokalemia   Multiple sclerosis   Sepsis due to Escherichia coli with acute renal failure   Diabetes mellitus with peripheral vascular disease   Leukocytosis   Hypophosphatemia   Hypomagnesemia   Anemia of chronic disease   Decubitus ulcer, stage 2   Lymphedema   Back pain   Proctitis   . baclofen  5 mg Oral TID  . cefTAZidime (FORTAZ)  IV  2 g Intravenous 3 times per day  . feeding supplement (PRO-STAT SUGAR FREE 64)  30 mL Oral BID  . heparin subcutaneous  5,000 Units Subcutaneous 3 times per day  . insulin aspart  0-5 Units Subcutaneous QHS  . insulin aspart  0-9 Units Subcutaneous TID WC  . metroNIDAZOLE  500 mg Oral 3 times per day  . midodrine  2.5 mg Oral BID WC  . pantoprazole  40 mg Oral Daily  . sodium chloride  3 mL Intravenous Q12H    SUBJECTIVE: She is feeling much better. She feels like she is close to her baseline. She has not been aware of the recent fevers she's been having. She's not having any more diarrhea. She does not have any unusual pain or swelling in her legs. She feels like the swelling in her arms and hands is improving slowly.  Review of Systems: Constitutional: positive for fevers, negative for anorexia, chills, sweats and weight loss Respiratory: negative Cardiovascular: negative Gastrointestinal: negative  Past Medical History  Diagnosis Date  . MS (multiple sclerosis)   . HTN (hypertension)   . DM II (diabetes mellitus, type II), controlled   . Lymphedema   . Anemia   . Gait disorder     Social History  Substance Use Topics  . Smoking status: Former  Smoker    Types: Cigarettes  . Smokeless tobacco: Never Used  . Alcohol Use: No    Family History  Problem Relation Age of Onset  . Multiple sclerosis Mother    Allergies  Allergen Reactions  . Sulfa Antibiotics Shortness Of Breath and Swelling    OBJECTIVE: Filed Vitals:   03/09/15 1625 03/09/15 2129 03/10/15 0500 03/10/15 0558  BP: 124/63 98/56  126/58  Pulse: 87 91  91  Temp: 99.4 F (37.4 C) 99.2 F (37.3 C)  101.7 F (38.7 C)  TempSrc: Oral Oral  Oral  Resp: 20 20  20   Height:      Weight:   189 lb 9.5 oz (86 kg) 187 lb 13.3 oz (85.2 kg)  SpO2: 99% 97%  95%   Body mass index is 33.28 kg/(m^2).  General: She is smiling and in good spirits watching television Skin: No rash. Left upper arm IV site looks normal Lungs: Clear anteriorly  Cor: Regular S1 and S2 with no murmur Abdomen: Soft and nontender Chronic venous stasis changes in both lower extremities without evidence of infection  Lab Results Lab Results  Component Value Date   WBC 18.3* 03/05/2015  HGB 10.6* 03/05/2015   HCT 30.7* 03/05/2015   MCV 81.2 03/05/2015   PLT 348 03/05/2015    Lab Results  Component Value Date   CREATININE 0.39* 03/05/2015   BUN 8 03/05/2015   NA 136 03/05/2015   K 4.0 03/05/2015   CL 105 03/05/2015   CO2 22 03/05/2015    Lab Results  Component Value Date   ALT 8* 03/03/2015   AST 15 03/03/2015   ALKPHOS 73 03/03/2015   BILITOT 0.6 03/03/2015     Microbiology: Recent Results (from the past 240 hour(s))  Culture, blood (x 2)     Status: None   Collection Time: 03/01/15  1:51 PM  Result Value Ref Range Status   Specimen Description BLOOD LEFT ARM  Final   Special Requests IN PEDIATRIC BOTTLE 3CC  Final   Culture   Final    NO GROWTH 5 DAYS Performed at [facility]    Report Status 03/06/2015 FINAL  Final  Culture, blood (x 2)     Status: None   Collection Time: 03/01/15  1:51 PM  Result Value Ref Range Status   Specimen Description BLOOD RIGHT ARM   Final   Special Requests BOTTLES DRAWN AEROBIC AND ANAEROBIC 10CC  Final   Culture  Setup Time   Final    GRAM NEGATIVE RODS IN BOTH AEROBIC AND ANAEROBIC BOTTLES CRITICAL RESULT CALLED TO, READ BACK BY AND VERIFIED WITH: C CREECH@0650  03/02/15    Culture   Final    CITROBACTER SPECIES Performed at [facility]    Report Status 03/05/2015 FINAL  Final   Organism ID, Bacteria CITROBACTER SPECIES  Final      Susceptibility   Citrobacter species - MIC*    CEFAZOLIN >=64 RESISTANT Resistant     CEFEPIME <=1 SENSITIVE Sensitive     CEFTAZIDIME <=1 SENSITIVE Sensitive     CEFTRIAXONE <=1 SENSITIVE Sensitive     CIPROFLOXACIN <=0.25 SENSITIVE Sensitive     GENTAMICIN <=1 SENSITIVE Sensitive     IMIPENEM 2 SENSITIVE Sensitive     TRIMETH/SULFA <=20 SENSITIVE Sensitive     PIP/TAZO <=4 SENSITIVE Sensitive     * CITROBACTER SPECIES  Urine culture     Status: None   Collection Time: 03/01/15  7:23 PM  Result Value Ref Range Status   Specimen Description URINE, CATHETERIZED  Final   Special Requests NONE  Final   Culture   Final    80,000 COLONIES/ml ESCHERICHIA COLI Performed at [facility]    Report Status 03/04/2015 FINAL  Final   Organism ID, Bacteria ESCHERICHIA COLI  Final      Susceptibility   Escherichia coli - MIC*    AMPICILLIN >=32 RESISTANT Resistant     CEFAZOLIN <=4 SENSITIVE Sensitive     CEFTRIAXONE <=1 SENSITIVE Sensitive     CIPROFLOXACIN >=4 RESISTANT Resistant     GENTAMICIN <=1 SENSITIVE Sensitive     IMIPENEM <=0.25 SENSITIVE Sensitive     NITROFURANTOIN <=16 SENSITIVE Sensitive     TRIMETH/SULFA <=20 SENSITIVE Sensitive     AMPICILLIN/SULBACTAM >=32 RESISTANT Resistant     PIP/TAZO 8 SENSITIVE Sensitive     * 80,000 COLONIES/ml ESCHERICHIA COLI  MRSA PCR Screening     Status: Abnormal   Collection Time: 03/01/15  9:34 PM  Result Value Ref Range Status   MRSA by PCR POSITIVE (A) NEGATIVE Final    Comment:        The GeneXpert MRSA Assay  (FDA approved  for NASAL specimens only), is one component of a comprehensive MRSA colonization surveillance program. It is not intended to diagnose MRSA infection nor to guide or monitor treatment for MRSA infections. RESULT CALLED TO, READ BACK BY AND VERIFIED WITH: M.REEVES,RN AT 2334 ON 03/02/15 BY W.SHEA      ASSESSMENT: The cause of her recent fevers remains uncertain. Overall her temperature curve is improving. I will consider stopping empiric antibiotics tomorrow.  PLAN: 1. Consider stopping antibiotics tomorrow  Michel Bickers, MD [facility] [place] [phone] pager   [phone] cell 03/10/2015, 10:33 AM

## 2015-03-10 NOTE — Progress Notes (Signed)
Occupational Therapy Treatment Patient Details Name: JERICKA KADAR MRN: [id number] DOB: [birth year] Today's Date: 03/10/2015    History of present illness Pt was admitted for sepsis.  She presented to ED with back pain.  Pt has a PMH significant for MS,DM, HTN and chronic LE lymphedema   OT comments  Reinterated importance of BUE being propped on pillows when pt resting  Follow Up Recommendations  Supervision/Assistance - 24 hour;SNF;No OT follow up;Other (comment) (pt dependent at this time)    Equipment Recommendations  None recommended by OT    Recommendations for Other Services      Precautions / Restrictions Precautions Precautions: Fall Precaution Comments: edema bil UEs       Mobility Bed Mobility      NT            Transfers          NT            Balance            NT                       ADL   Eating/Feeding: Total assistance;Bed level Eating/Feeding Details (indicate cue type and reason): edema very limiting                                   General ADL Comments: pt only tolerated positoning BUE on pillows . When OT arrived BUe were down beside pt - not propped up. Educated pts friend, pt and CNA regarding importance of BUE being propped up.  2 pillows under each arm.                Cognition   Behavior During Therapy: WFL for tasks assessed/performed Overall Cognitive Status: Within Functional Limits for tasks assessed                       Extremity/Trunk Assessment      BUE very swollen           Exercises  refused PROM   Shoulder Instructions  elevate!     General Comments      Pertinent Vitals/ Pain       Pain Assessment: No/denies pain Pain Score: 0-No pain Pain Location: Very limited PROM BUE Pain Descriptors / Indicators: Sore Pain Intervention(s): Monitored during session;Limited activity within patient's tolerance         Frequency Min 2X/week     Progress  Toward Goals  OT Goals(current goals can now be found in the care plan section)  Progress towards OT goals: Not progressing toward goals - comment;OT to reassess next treatment     Plan Discharge plan remains appropriate    Co-evaluation                 End of Session     Activity Tolerance Patient limited by pain   Patient Left in bed;with call bell/phone within reach   Nurse Communication          Time: 3235-5732 OT Time Calculation (min): 10 min  Charges: OT General Charges $OT Visit: 1 Procedure OT Treatments $Therapeutic Activity: 8-22 mins  REDDING, Thereasa Parkin 03/10/2015, 12:57 PM

## 2015-03-11 LAB — GLUCOSE, CAPILLARY
GLUCOSE-CAPILLARY: 116 mg/dL — AB (ref 65–99)
Glucose-Capillary: 105 mg/dL — ABNORMAL HIGH (ref 65–99)
Glucose-Capillary: 108 mg/dL — ABNORMAL HIGH (ref 65–99)
Glucose-Capillary: 124 mg/dL — ABNORMAL HIGH (ref 65–99)

## 2015-03-11 NOTE — Progress Notes (Addendum)
Patient ID: Jocelyn Sanchez, female   DOB: 01-02-1956, 59 y.o.   MRN: [id number] [facility] PROGRESS NOTE  Jocelyn Sanchez EZM:[id number] DOB: [birth year] DOA: 03/01/2015 PCP: [name], MD  Brief narrative:    59 y.o. female with past medical history of multiple sclerosis, diabetes mellitus type 2, hypertension, lower extremity lymphedema who presented to [facility] ED 03/01/15 with initial concern for low back pain. She was subsequently founds to have citrobacter bacteremia and E.Coli UTI and has developed a septic shock. She was on PCCM service through 9/16 and transferred to [facility] 9/17.  SIGNIFICANT EVENTS  9/12 - Admission to hospital 9/12 - Transfusion of PRBC 9/13 - Radial arterial line placed. 9/14 - Radial arterial line discontinued. 9/17 - Care transitioned to [facility] 9/18 - Spiking fevers. CT abd and chest ordered for eval of fever - severe proctitis 9/19 - Consulted GI and ID. Broadened abx coverage to ceftazidime and added flagyl. 9/20 - Flex sigmoidoscopy done, no proctitis 9/21 - ID recommended stopping abx 9/22  Anticipated discharge: Patient continues to spike fevers. Per infectious disease will stop antibodies today and continue to monitor fever curve. Anticipated discharge by the end of the week if she is medically stable, likely by 03/13/2015. Patient eager to go home.  Assessment/Plan:    Principal Problem: Septic shock secondary to E coli UTI and Citrobacter bacteremia / Severe sepsis  - Patient was found to be in septic shock on the admission. This is secondary to Escherichia coli urinary tract infection and Citrobacter bacteremia. She has had TTE done and this was negative for vegetations. - Patient was under the CCM care through 03/05/2015. Care was transitioned to [facility] 03/06/2015. She was already on Rocephin as recommended by critical care medicine for targeted treatment of Escherichia coli and suture backed or infections. - Her hospital course is complicated with  ongoing fevers while on antibiotics. As part of the fever workup, on 03/07/2015 we ordered chest x-ray and CT abdomen, pelvis and chest. She was found to have severe proctitis on CT abdomen. - Infectious disease and GI consulted on 03/08/2015. ID recommended adding Flagyl and broadening the coverage to [facility] (so on 9/19 we stopped Rocephin). GI recommended obtaining flexible sigmoidoscopy. Flexible sigmoidoscopy on 03/09/2015 did not show proctitis. - We were not able to obtain repeat blood cultures or blood work for that matter because she has a very difficult IV access because of significant diffuse lymphedema. She has refused right IJ placement and IV team could not place PICC line because of lymphedema. - Most recently, infectious disease recommended stopping ceftazidime and Flagyl today 9/22 and continue to monitor fever curve.  Active Problems: Severe proctitis - As part of the fever workup we obtained CT abdomen and pelvis which demonstrated severe proctitis. GI recommended flexible sigmoidoscopy which was done 03/09/2015 but he did not show proctitis. - We will stop ceftaz edema and Flagyl today per infectious disease recommendations. - No reports of diarrhea.  Acute renal failure complicated by non anion gap metabolic acidosis / Hyperkalemia / Hypokalemia  - Secondary to septic shock - Creatinine improved however we are unable to monitor the blood work because patient refuses to be stuck because of the pain from significant lymphedema. As mentioned above unable to insert a PICC line because of lymphedema and patient refused IJ line.  Hypophosphatemia / Hypomagnesemia - Supplemented - As mentioned, unable to monitor the status of electrolyte replacement because patient refuses blood work.  Chronic lymphedema with sacral decubitus ulcer, stage 2 -  Patient with long-standing lymphedema and recurrence of pressure injury over area that had been previously healed. Pt also has indwelling  urinary catheter and is incontinent of stool. - Wound care has seen the patient in consultation. We appreciated their assessment. Patient has moisture associated skin damage plus pressure and friction in the area of previous ulceration. Measurement:1.5cm x 2cm x 0.2cm Stage 2 in an area measuring 6cm x 8cm of scar tissue  - Dressing recommendations: A soft silicone foam dressing provided; bilateral pressure redistribution heel boots provided to prevent injury in the presence of the weight and immobility resulting from the lymphedema.   Diabetes mellitus type 2 with peripheral vascular manifestations - A1c in 11/2014 - 6.7 indicating good glycemic control - CBGs in past 24 hours: 124, 101, 105  Anemia of chronic disease  - Secondary to multiple sclerosis - Patient received 2 units of PRBC  Multiple sclerosis with quadriparesis   - Not on treatment and does not follow with neurology.   Acute low back pain. - No acute findings seen on lumbar spine x-ray - Improved    DVT Prophylaxis  - Heparin subQ ordered while patient in hospital   Code Status: Full.  Family Communication:  plan of care discussed with the patient Disposition Plan: possibly by the end of the week, 03/13/2015.  IV access:  Peripheral IV  Procedures and diagnostic studies:    Dg Chest Port 1 View 2015-03-24    Interval development of bilateral pleural effusions and bilateral lower lobes airspace consolidation versus atelectasis. This represents an acute change from the previous radiograph dated 03/01/2015   Electronically Signed   By: Fidela Salisbury M.D.   On: Mar 24, 2015 10:35  Dg Lumbar Spine 2-3 Views 03/01/2015  1. Considerable lumbar spondylosis, scoliosis, and degenerative disc disease with mild subluxations at L3-4 and L4-5. The scoliotic curvature oblique and indistinct on the lateral projection. I do not see a definite fracture. MRI (or CT) would be a superior way to assess the lumbar spine for impingement  given the limitations due to the degree of scoliosis.   Electronically Signed   By: Van Clines M.D.   On: 03/01/2015 18:39   Dg Chest Portable 1 View 03/01/2015   No acute cardiopulmonary disease.   Electronically Signed   By: Lajean Manes M.D.   On: 03/01/2015 14:49    Medical Consultants:  Cardiology PCCM consulted and then primary through 9/16 Infectious disease, Dr. Michel Bickers Gastroenterology, Dr. Tyrone Sage  Other Consultants:  PT evaluation Nutrition WOC  IAnti-Infectives:   Vanco and zosyn stopped 03/05/2015 Rocephin 03/05/2015 --> 03/08/2015  Flagyl 03/08/2015 --> 03/11/2015 Fortaz 03/08/2015 --> 03/11/2015.   Leisa Lenz, MD  [facility] Pager [phone]  Time spent in minutes: 25 minutes  If 7PM-7AM, please contact night-coverage [email or web address] Password [facility] 03/11/2015, 10:43 AM   LOS: 10 days    HPI/Subjective: No acute overnight events. No reports of nausea or vomiting. No reports of pain this morning.  Objective: Filed Vitals:   03/10/15 0558 03/10/15 1500 03/10/15 2200 03/11/15 0630  BP: 126/58 135/62 125/55 134/57  Pulse: 91 91 82 90  Temp: 101.7 F (38.7 C) 101.1 F (38.4 C) 98.1 F (36.7 C) 99.4 F (37.4 C)  TempSrc: Oral Oral Oral Oral  Resp: 20 20 20 20   Height:      Weight: 85.2 kg (187 lb 13.3 oz)     SpO2: 95% 99% 98% 96%    Intake/Output Summary (Last 24 hours) at 03/11/15 1043 Last  data filed at 03/11/15 0630  Gross per 24 hour  Intake   1000 ml  Output   1650 ml  Net   -650 ml    Exam:   General:  Pt is alert, no acute distress, answers questions appropriately  Cardiovascular: Rate controlled, appreciate S1-S2  Respiratory: Bilateral air entry, no wheezing  Abdomen:  Appreciate bowel sounds, nontender to palpation, no rebound tenderness or guarding  Extremities: Diffuse lymphedema throughout upper and lower extremities, palpable pulses  Neuro: No focal neurological deficits.  Data Reviewed: Basic  Metabolic Panel:  Recent Labs Lab 03/05/15 0030  NA 136  K 4.0  CL 105  CO2 22  GLUCOSE 123*  BUN 8  CREATININE 0.39*  CALCIUM 7.5*  MG 1.5*  PHOS 2.3*   Liver Function Tests:  Recent Labs Lab 03/05/15 0030  ALBUMIN 1.7*   No results for input(s): LIPASE, AMYLASE in the last 168 hours. No results for input(s): AMMONIA in the last 168 hours. CBC:  Recent Labs Lab 03/05/15 0355  WBC 18.3*  NEUTROABS 14.1*  HGB 10.6*  HCT 30.7*  MCV 81.2  PLT 348   Cardiac Enzymes: No results for input(s): CKTOTAL, CKMB, CKMBINDEX, TROPONINI in the last 168 hours. BNP: Invalid input(s): POCBNP CBG:  Recent Labs Lab 03/10/15 0814 03/10/15 1201 03/10/15 1657 03/10/15 2131 03/11/15 0753  GLUCAP 104* 118* 124* 101* 105*   Results for orders placed or performed during the hospital encounter of 03/01/15  Culture, blood (x 2)     Status: None   Collection Time: 03/01/15  1:51 PM  Result Value Ref Range Status   Specimen Description BLOOD LEFT ARM  Final   Special Requests IN PEDIATRIC BOTTLE 3CC  Final   Culture   Final    NO GROWTH 5 DAYS Performed at [facility]    Report Status 03/06/2015 FINAL  Final  Culture, blood (x 2)     Status: None   Collection Time: 03/01/15  1:51 PM  Result Value Ref Range Status   Specimen Description BLOOD RIGHT ARM  Final   Special Requests BOTTLES DRAWN AEROBIC AND ANAEROBIC 10CC  Final   Culture  Setup Time   Final   Culture   Final    CITROBACTER SPECIES Performed at [facility]    Report Status 03/05/2015 FINAL  Final   Organism ID, Bacteria CITROBACTER SPECIES  Final      Susceptibility   Citrobacter species - MIC*    CEFAZOLIN >=64 RESISTANT Resistant     CEFEPIME <=1 SENSITIVE Sensitive     CEFTAZIDIME <=1 SENSITIVE Sensitive     CEFTRIAXONE <=1 SENSITIVE Sensitive     CIPROFLOXACIN <=0.25 SENSITIVE Sensitive     GENTAMICIN <=1 SENSITIVE Sensitive     IMIPENEM 2 SENSITIVE Sensitive     TRIMETH/SULFA <=20  SENSITIVE Sensitive     PIP/TAZO <=4 SENSITIVE Sensitive     * CITROBACTER SPECIES  Urine culture     Status: None   Collection Time: 03/01/15  7:23 PM  Result Value Ref Range Status   Specimen Description URINE, CATHETERIZED  Final   Special Requests NONE  Final   Culture   Final    80,000 COLONIES/ml ESCHERICHIA COLI Performed at [facility]    Report Status 03/04/2015 FINAL  Final   Organism ID, Bacteria ESCHERICHIA COLI  Final      Susceptibility   Escherichia coli - MIC*    AMPICILLIN >=32 RESISTANT Resistant     CEFAZOLIN <=  4 SENSITIVE Sensitive     CEFTRIAXONE <=1 SENSITIVE Sensitive     CIPROFLOXACIN >=4 RESISTANT Resistant     GENTAMICIN <=1 SENSITIVE Sensitive     IMIPENEM <=0.25 SENSITIVE Sensitive     NITROFURANTOIN <=16 SENSITIVE Sensitive     TRIMETH/SULFA <=20 SENSITIVE Sensitive     AMPICILLIN/SULBACTAM >=32 RESISTANT Resistant     PIP/TAZO 8 SENSITIVE Sensitive     * 80,000 COLONIES/ml ESCHERICHIA COLI  MRSA PCR Screening     Status: Abnormal   Collection Time: 03/01/15  9:34 PM  Result Value Ref Range Status   MRSA by PCR POSITIVE (A) NEGATIVE Final    . baclofen  5 mg Oral TID  . feeding supplement (PRO-STAT SUGAR FREE 64)  30 mL Oral BID  . heparin subcutaneous  5,000 Units Subcutaneous 3 times per day  . insulin aspart  0-5 Units Subcutaneous QHS  . insulin aspart  0-9 Units Subcutaneous TID WC  . midodrine  2.5 mg Oral BID WC  . pantoprazole  40 mg Oral Daily  . sodium chloride  3 mL Intravenous Q12H

## 2015-03-11 NOTE — Progress Notes (Signed)
Patient ID: Jocelyn Sanchez, female   DOB: Oct 25, 1955, 59 y.o.   MRN: [id number]         [facility] for Infectious Disease    Date of Admission:  03/01/2015   Total days of antibiotics 10        Day 4 ceftazidime        Day 4 metronidazole          Principal Problem:   Septic shock Active Problems:   E. coli UTI (urinary tract infection)   Citrobacter infection   Fever   Hypokalemia   Multiple sclerosis   Sepsis due to Escherichia coli with acute renal failure   Diabetes mellitus with peripheral vascular disease   Leukocytosis   Hypophosphatemia   Hypomagnesemia   Anemia of chronic disease   Decubitus ulcer, stage 2   Lymphedema   Back pain   Proctitis   . baclofen  5 mg Oral TID  . feeding supplement (PRO-STAT SUGAR FREE 64)  30 mL Oral BID  . heparin subcutaneous  5,000 Units Subcutaneous 3 times per day  . insulin aspart  0-5 Units Subcutaneous QHS  . insulin aspart  0-9 Units Subcutaneous TID WC  . midodrine  2.5 mg Oral BID WC  . pantoprazole  40 mg Oral Daily  . sodium chloride  3 mL Intravenous Q12H    SUBJECTIVE: She is feeling much better. She is eager to go home. Other than some improving, residual swelling in her hands and arms she feels like she is back at her baseline. She denies any cough or shortness of breath. She is not having any unusual pain or swelling in her legs. She has not had any more diarrhea.  Review of Systems: Constitutional: positive for fevers, negative for anorexia, chills, sweats and weight loss Respiratory: negative Cardiovascular: negative Gastrointestinal: negative  Past Medical History  Diagnosis Date  . MS (multiple sclerosis)   . HTN (hypertension)   . DM II (diabetes mellitus, type II), controlled   . Lymphedema   . Anemia   . Gait disorder     Social History  Substance Use Topics  . Smoking status: Former Smoker    Types: Cigarettes  . Smokeless tobacco: Never Used  . Alcohol Use: No    Family History    Problem Relation Age of Onset  . Multiple sclerosis Mother    Allergies  Allergen Reactions  . Sulfa Antibiotics Shortness Of Breath and Swelling    OBJECTIVE: Filed Vitals:   03/10/15 0558 03/10/15 1500 03/10/15 2200 03/11/15 0630  BP: 126/58 135/62 125/55 134/57  Pulse: 91 91 82 90  Temp: 101.7 F (38.7 C) 101.1 F (38.4 C) 98.1 F (36.7 C) 99.4 F (37.4 C)  TempSrc: Oral Oral Oral Oral  Resp: 20 20 20 20   Height:      Weight: 187 lb 13.3 oz (85.2 kg)     SpO2: 95% 99% 98% 96%   Body mass index is 33.28 kg/(m^2).  General: She is smiling and in good spirits. Her son is visiting Skin: Left upper arm IV site looks normal Lungs: Clear anteriorly  Cor: Regular S1 and S2 with no murmur Abdomen: Soft and nontender Chronic venous stasis changes in both lower extremities without evidence of infection  Lab Results Lab Results  Component Value Date   WBC 18.3* 03/05/2015   HGB 10.6* 03/05/2015   HCT 30.7* 03/05/2015   MCV 81.2 03/05/2015   PLT 348 03/05/2015    Lab Results  Component Value Date   CREATININE 0.39* 03/05/2015   BUN 8 03/05/2015   NA 136 03/05/2015   K 4.0 03/05/2015   CL 105 03/05/2015   CO2 22 03/05/2015    Lab Results  Component Value Date   ALT 8* 03/03/2015   AST 15 03/03/2015   ALKPHOS 73 03/03/2015   BILITOT 0.6 03/03/2015     Microbiology: Recent Results (from the past 240 hour(s))  Urine culture     Status: None   Collection Time: 03/01/15  7:23 PM  Result Value Ref Range Status   Specimen Description URINE, CATHETERIZED  Final   Special Requests NONE  Final   Culture   Final    80,000 COLONIES/ml ESCHERICHIA COLI Performed at [facility]    Report Status 03/04/2015 FINAL  Final   Organism ID, Bacteria ESCHERICHIA COLI  Final      Susceptibility   Escherichia coli - MIC*    AMPICILLIN >=32 RESISTANT Resistant     CEFAZOLIN <=4 SENSITIVE Sensitive     CEFTRIAXONE <=1 SENSITIVE Sensitive     CIPROFLOXACIN >=4  RESISTANT Resistant     GENTAMICIN <=1 SENSITIVE Sensitive     IMIPENEM <=0.25 SENSITIVE Sensitive     NITROFURANTOIN <=16 SENSITIVE Sensitive     TRIMETH/SULFA <=20 SENSITIVE Sensitive     AMPICILLIN/SULBACTAM >=32 RESISTANT Resistant     PIP/TAZO 8 SENSITIVE Sensitive     * 80,000 COLONIES/ml ESCHERICHIA COLI  MRSA PCR Screening     Status: Abnormal   Collection Time: 03/01/15  9:34 PM  Result Value Ref Range Status   MRSA by PCR POSITIVE (A) NEGATIVE Final    Comment:        The GeneXpert MRSA Assay (FDA approved for NASAL specimens only), is one component of a comprehensive MRSA colonization surveillance program. It is not intended to diagnose MRSA infection nor to guide or monitor treatment for MRSA infections. RESULT CALLED TO, READ BACK BY AND VERIFIED WITH: M.REEVES,RN AT 2334 ON 03/02/15 BY W.SHEA      ASSESSMENT: She has showed slow but steady clinical improvement. Her temperature curve is downward. Her antibiotics have been stopped. If she remains afebrile she should be ready for discharge tomorrow.  PLAN: 1. Observe off of antibiotics  Michel Bickers, MD [facility] for [place] [phone] pager   [phone] cell 03/11/2015, 3:12 PM

## 2015-03-11 NOTE — Care Management Important Message (Signed)
Important Message  Patient Details  Name: Jocelyn Sanchez MRN: [id number] Date of Birth: 01/12/1956   Medicare Important Message Given:  Yes-third notification given    Camillo Flaming 03/11/2015, 11:28 [place] Message  Patient Details  Name: Jocelyn Sanchez MRN: [id number] Date of Birth: 06-14-1956   Medicare Important Message Given:  Yes-third notification given    Camillo Flaming 03/11/2015, 11:28 AM

## 2015-03-11 NOTE — Progress Notes (Signed)
OT Cancellation Note  Patient Details Name: RUTA CAPECE MRN: [id number] DOB: May 10, 1956   Cancelled Treatment:    Reason Eval/Treat Not Completed: Other (comment) positioned bil UEs with pillows/towel rolls; pt did not want to work on ROM.  Pt is moving LUE more and while, edema is present, it is less than the last time I saw her.  Spoke to NT about positioning on ramp incline.  SPENCER,MARYELLEN 03/11/2015, 12:28 PM  Lesle Chris, OTR/L [phone] 03/11/2015

## 2015-03-11 NOTE — Care Management Note (Signed)
Case Management Note  Patient Details  Name: Jocelyn Sanchez MRN: [id number] Date of Birth: 1955-11-23  Subjective/Objective:         Admitted with sepsis           Action/Plan: Discharge planning, per notes plan for d/c to home with resumption of services by [facility] aide 6h/day. Patient has hospital bed, w/c, hoyer. CSW aware may need transportation at d/c.   Expected Discharge Date:  03/04/15               Expected Discharge Plan:  [place]  In-House Referral:  Clinical Social Work  Discharge planning Services  CM Consult  Post Acute Care Choice:  Resumption of Svcs/PTA Provider Choice offered to:  NA  DME Arranged:  N/A DME Agency:  NA  HH Arranged:  CAPS Program [place] Agency:  Other - See comment  Status of Service:  Completed, signed off  Medicare Important Message Given:  Yes-third notification given Date Medicare IM Given:    Medicare IM give by:    Date Additional Medicare IM Given:    Additional Medicare Important Message give by:     If discussed at [place] of Stay Meetings, dates discussed:    Additional Comments:  Guadalupe Maple, RN 03/11/2015, 1:22 PM

## 2015-03-12 DIAGNOSIS — X58XXXD Exposure to other specified factors, subsequent encounter: Secondary | ICD-10-CM

## 2015-03-12 DIAGNOSIS — R509 Fever, unspecified: Secondary | ICD-10-CM | POA: Insufficient documentation

## 2015-03-12 DIAGNOSIS — S81802D Unspecified open wound, left lower leg, subsequent encounter: Secondary | ICD-10-CM

## 2015-03-12 DIAGNOSIS — L03116 Cellulitis of left lower limb: Secondary | ICD-10-CM

## 2015-03-12 LAB — GLUCOSE, CAPILLARY
Glucose-Capillary: 90 mg/dL (ref 65–99)
Glucose-Capillary: 107 mg/dL — ABNORMAL HIGH (ref 65–99)

## 2015-03-12 MED ORDER — PANTOPRAZOLE SODIUM 40 MG PO TBEC: 40.0000 mg | DELAYED_RELEASE_TABLET | Freq: Every day | ORAL | Status: DC

## 2015-03-12 MED ORDER — HYDROCODONE-ACETAMINOPHEN 5-325 MG PO TABS: 2.0000 | ORAL_TABLET | ORAL | Status: DC | PRN

## 2015-03-12 MED ORDER — METHOCARBAMOL 500 MG PO TABS: 250.0000 mg | ORAL_TABLET | Freq: Three times a day (TID) | ORAL | Status: DC | PRN

## 2015-03-12 MED ORDER — PRO-STAT SUGAR FREE PO LIQD: 30.0000 mL | Freq: Two times a day (BID) | ORAL | Status: DC

## 2015-03-12 MED ORDER — MIDODRINE HCL 2.5 MG PO TABS: 2.5000 mg | ORAL_TABLET | Freq: Two times a day (BID) | ORAL | Status: DC

## 2015-03-12 MED ORDER — ACETAMINOPHEN 325 MG PO TABS: 650.0000 mg | ORAL_TABLET | Freq: Four times a day (QID) | ORAL | Status: DC | PRN

## 2015-03-12 MED ORDER — ZOLPIDEM TARTRATE 5 MG PO TABS: 5.0000 mg | ORAL_TABLET | Freq: Every evening | ORAL | Status: DC | PRN

## 2015-03-12 MED ORDER — GLIPIZIDE 5 MG PO TABS: 2.5000 mg | ORAL_TABLET | Freq: Every day | ORAL | Status: DC

## 2015-03-12 NOTE — Progress Notes (Signed)
Jocelyn Sanchez to be D/C'd Home per MD order.  Discussed prescriptions and follow up appointments with the patient. Prescriptions given to patient, medication list explained in detail. Pt verbalized understanding.    Medication List    STOP taking these medications        BENICAR HCT 20-12.5 MG per tablet  Generic drug:  olmesartan-hydrochlorothiazide     diphenhydramine-acetaminophen 25-500 MG Tabs  Commonly known as:  TYLENOL PM     ferrous sulfate 325 (65 FE) MG tablet  Commonly known as:  FERROUSUL     ibuprofen 400 MG tablet  Commonly known as:  ADVIL,MOTRIN      TAKE these medications        acetaminophen 325 MG tablet  Commonly known as:  TYLENOL  Take 2 tablets (650 mg total) by mouth every 6 (six) hours as needed for mild pain (or Fever >/= 101).     aspirin 81 MG EC tablet  Take 1 tablet (81 mg total) by mouth daily.     baclofen 10 MG tablet  Commonly known as:  LIORESAL  Take 0.5 tablets (5 mg total) by mouth 3 (three) times daily.     feeding supplement (PRO-STAT SUGAR FREE 64) Liqd  Take 30 mLs by mouth 2 (two) times daily.     glipiZIDE 5 MG tablet  Commonly known as:  GLUCOTROL  Take 0.5 tablets (2.5 mg total) by mouth daily before breakfast.     HYDROcodone-acetaminophen 5-325 MG per tablet  Commonly known as:  NORCO/VICODIN  Take 2 tablets by mouth every 4 (four) hours as needed for moderate pain.     methocarbamol 500 MG tablet  Commonly known as:  ROBAXIN  Take 0.5 tablets (250 mg total) by mouth every 8 (eight) hours as needed for muscle spasms.     midodrine 2.5 MG tablet  Commonly known as:  PROAMATINE  Take 1 tablet (2.5 mg total) by mouth 2 (two) times daily with a meal.     pantoprazole 40 MG tablet  Commonly known as:  PROTONIX  Take 1 tablet (40 mg total) by mouth daily.     zolpidem 5 MG tablet  Commonly known as:  AMBIEN  Take 1 tablet (5 mg total) by mouth at bedtime as needed for sleep (insomnia).        Filed Vitals:   03/12/15 1414  BP: 136/56  Pulse: 76  Temp: 99 F (37.2 C)  Resp: 20    Skin clean, dry and intact without evidence of skin break down, no evidence of skin tears noted. IV catheter discontinued intact. Site without signs and symptoms of complications. Dressing and pressure applied. Pt denies pain at this time. No complaints noted.  An After Visit Summary was printed and given to the patient. Patient escorted via [place], and D/C home via private auto.  Lolita Rieger 03/12/2015 4:40 PM

## 2015-03-12 NOTE — Progress Notes (Signed)
Pt for discharge to home and CSW received notification that pt needs ambulance transport home.   CSW spoke with pt at bedside and confirmed address. Pt requesting pick up around 4 pm. CSW spoke with RN and RN agreeable to [place] scheduling ambulance transport for pt to home at 3:30 pm.  CSW arranged ambulance transport for 3:30 pm and transport packet left in pt shadow chart for [facility].   No further social work needs identified at this time.  CSW signing off.   Alison Murray, MSW, [place] Work [phone]

## 2015-03-12 NOTE — Discharge Instructions (Signed)
Acetaminophen tablets or caplets What is this medicine? ACETAMINOPHEN (a set a MEE noe fen) is a pain reliever. It is used to treat mild pain and fever. This medicine may be used for other purposes; ask your health care provider or pharmacist if you have questions. COMMON BRAND NAME(S): Aceta, Actamin, Anacin Aspirin Free, Genapap, Genebs, Mapap, Pain & Fever, Pain and Fever, PAIN RELIEF, PAIN RELIEF Extra Strength, Pain Reliever, Panadol, PHARBETOL, Q-Pap, Q-Pap Extra Strength, Tylenol, Tylenol CrushableTablet, Tylenol Extra Strength, XS No Aspirin, XS Pain Reliever What should I tell my health care provider before I take this medicine? They need to know if you have any of these conditions: -if you often drink alcohol -liver disease -an unusual or allergic reaction to acetaminophen, other medicines, foods, dyes, or preservatives -pregnant or trying to get pregnant -breast-feeding How should I use this medicine? Take this medicine by mouth with a glass of water. Follow the directions on the package or prescription label. Take your medicine at regular intervals. Do not take your medicine more often than directed. Talk to your pediatrician regarding the use of this medicine in children. While this drug may be prescribed for children as young as 51 years of age for selected conditions, precautions do apply. Overdosage: If you think you have taken too much of this medicine contact a poison control center or emergency room at once. NOTE: This medicine is only for you. Do not share this medicine with others. What if I miss a dose? If you miss a dose, take it as soon as you can. If it is almost time for your next dose, take only that dose. Do not take double or extra doses. What may interact with this medicine? -alcohol -imatinib -isoniazid -other medicines with acetaminophen This list may not describe all possible interactions. Give your health care provider a list of all the medicines, herbs,  non-prescription drugs, or dietary supplements you use. Also tell them if you smoke, drink alcohol, or use illegal drugs. Some items may interact with your medicine. What should I watch for while using this medicine? Tell your doctor or health care professional if the pain lasts more than 10 days (5 days for children), if it gets worse, or if there is a new or different kind of pain. Also, check with your doctor if a fever lasts for more than 3 days. Do not take other medicines that contain acetaminophen with this medicine. Always read labels carefully. If you have questions, ask your doctor or pharmacist. If you take too much acetaminophen get medical help right away. Too much acetaminophen can be very dangerous and cause liver damage. Even if you do not have symptoms, it is important to get help right away. What side effects may I notice from receiving this medicine? Side effects that you should report to your doctor or health care professional as soon as possible: -allergic reactions like skin rash, itching or hives, swelling of the face, lips, or tongue -breathing problems -fever or sore throat -redness, blistering, peeling or loosening of the skin, including inside the mouth -trouble passing urine or change in the amount of urine -unusual bleeding or bruising -unusually weak or tired -yellowing of the eyes or skin Side effects that usually do not require medical attention (report to your doctor or health care professional if they continue or are bothersome): -headache -nausea, stomach upset This list may not describe all possible side effects. Call your doctor for medical advice about side effects. You may report side effects  to FDA at 1-800-FDA-1088. Where should I keep my medicine? Keep out of reach of children. Store at room temperature between 20 and 25 degrees C (68 and 77 degrees F). Protect from moisture and heat. Throw away any unused medicine after the expiration date. NOTE: This  sheet is a summary. It may not cover all possible information. If you have questions about this medicine, talk to your doctor, pharmacist, or health care provider.  2015, Elsevier/Gold Standard. (2013-01-27 12:54:16)

## 2015-03-12 NOTE — Progress Notes (Signed)
Occupational Therapy Treatment Patient Details Name: Jocelyn Sanchez MRN: [id number] DOB: May 22, 1956 Today's Date: 03/12/2015    History of present illness Pt was admitted for sepsis.  She presented to ED with back pain.  Pt has a PMH significant for MS,DM, HTN and chronic LE lymphedema   OT comments  Pt is making progress with edema management and increased ROM/strength.  Pt may leave today.   Follow Up Recommendations  Supervision/Assistance - 24 hour;SNF;No OT follow up;Other (comment)    Equipment Recommendations  None recommended by OT    Recommendations for Other Services      Precautions / Restrictions Precautions Precautions: Fall       Mobility Bed Mobility                  Transfers                      Balance                                   ADL   Eating/Feeding: Minimal assistance;Bed level (similated with LUE and built up foam)                                     General ADL Comments: replaced foam.  Pt is unable to self feed with RUE but is moving this better.  Able to simulate scooping and bringing spoon to mouth with LUE and built up foam.      Vision                     Perception     Praxis      Cognition   Behavior During Therapy: [facility] for tasks assessed/performed Overall Cognitive Status: Within Functional Limits for tasks assessed                       Extremity/Trunk Assessment               Exercises Other Exercises Other Exercises: Pt actively moved L fingers, wrist and elbow.  Pt with very minimal swelling Other Exercises: RUE with moderate edema:  reviewed edema management, pt moved fingers independenly about 1/4 ROM, added towel to create ramp for positioning and performed gentle retrograde massage.  Pt tolerated AAROM to elbow.  Did not want to work on shoulder.  Encouraged her to have family assist with AAROM at home and to continue AROM as able   Shoulder  Instructions       General Comments      Pertinent Vitals/ Pain       Pain Assessment: No/denies pain  Home Living                                          Prior Functioning/Environment              Frequency       Progress Toward Goals  OT Goals(current goals can now be found in the care plan section)  Progress towards OT goals: Progressing toward goals     Plan      Co-evaluation  End of Session     Activity Tolerance     Patient Left     Nurse Communication          Time: 0950-1002 OT Time Calculation (min): 12 min  Charges: OT General Charges $OT Visit: 1 Procedure OT Treatments $Therapeutic Activity: 8-22 mins  SPENCER,MARYELLEN 03/12/2015, 10:18 AM Lesle Chris, OTR/L [phone] 03/12/2015

## 2015-03-12 NOTE — Progress Notes (Signed)
[facility]    Subjective: No new complaints   Antibiotics:  Anti-infectives    Start     Dose/Rate Route Frequency Ordered Stop   03/08/15 1800  cefTAZidime (FORTAZ) 2 g in dextrose 5 % 50 mL IVPB  Status:  Discontinued     2 g 100 mL/hr over 30 Minutes Intravenous 3 times per day 03/08/15 1647 03/11/15 1043   03/08/15 1400  metroNIDAZOLE (FLAGYL) tablet 500 mg  Status:  Discontinued     500 mg Oral 3 times per day 03/08/15 1127 03/11/15 1043   03/05/15 1600  cefTRIAXone (ROCEPHIN) 2 g in dextrose 5 % 50 mL IVPB  Status:  Discontinued     2 g 100 mL/hr over 30 Minutes Intravenous Every 24 hours 03/05/15 1057 03/08/15 1629   03/05/15 1000  vancomycin (VANCOCIN) 1,250 mg in sodium chloride 0.9 % 250 mL IVPB  Status:  Discontinued     1,250 mg 166.7 mL/hr over 90 Minutes Intravenous Every 12 hours 03/05/15 0310 03/05/15 1006   03/02/15 2000  vancomycin (VANCOCIN) 1,250 mg in sodium chloride 0.9 % 250 mL IVPB  Status:  Discontinued     1,250 mg 166.7 mL/hr over 90 Minutes Intravenous Every 24 hours 03/01/15 1728 03/01/15 2000   03/02/15 2000  vancomycin (VANCOCIN) IVPB 1000 mg/200 mL premix  Status:  Discontinued     1,000 mg 200 mL/hr over 60 Minutes Intravenous Every 24 hours 03/01/15 2000 03/02/15 1045   03/02/15 1200  vancomycin (VANCOCIN) IVPB 1000 mg/200 mL premix  Status:  Discontinued     1,000 mg 200 mL/hr over 60 Minutes Intravenous Every 12 hours 03/02/15 1144 03/05/15 0310   03/02/15 0200  piperacillin-tazobactam (ZOSYN) IVPB 3.375 g  Status:  Discontinued     3.375 g 12.5 mL/hr over 240 Minutes Intravenous Every 8 hours 03/01/15 1728 03/05/15 1006   03/01/15 1730  vancomycin (VANCOCIN) 1,250 mg in sodium chloride 0.9 % 250 mL IVPB     1,250 mg 166.7 mL/hr over 90 Minutes Intravenous STAT 03/01/15 1727 03/02/15 1915   03/01/15 1700  piperacillin-tazobactam (ZOSYN) IVPB 3.375 g     3.375 g 100 mL/hr over 30 Minutes  Intravenous  Once 03/01/15 1654 03/01/15 1931   03/01/15 1700  vancomycin (VANCOCIN) IVPB 1000 mg/200 mL premix  Status:  Discontinued     1,000 mg 200 mL/hr over 60 Minutes Intravenous  Once 03/01/15 1654 03/01/15 1727   03/01/15 1700  piperacillin-tazobactam (ZOSYN) IVPB 3.375 g  Status:  Discontinued     3.375 g 12.5 mL/hr over 240 Minutes Intravenous  Once 03/01/15 1654 03/01/15 1654      Medications: Scheduled Meds: . baclofen  5 mg Oral TID  . feeding supplement (PRO-STAT SUGAR FREE 64)  30 mL Oral BID  . heparin subcutaneous  5,000 Units Subcutaneous 3 times per day  . insulin aspart  0-5 Units Subcutaneous QHS  . insulin aspart  0-9 Units Subcutaneous TID WC  . midodrine  2.5 mg Oral BID WC  . pantoprazole  40 mg Oral Daily  . sodium chloride  3 mL Intravenous Q12H   Continuous Infusions: . sodium chloride 20 mL/hr at 03/10/15 0527   PRN Meds:.Place/Maintain arterial line **AND** sodium chloride, acetaminophen **OR** [DISCONTINUED] acetaminophen, albuterol, methocarbamol, zolpidem    Objective: Weight change:   Intake/Output Summary (Last 24 hours) at 03/12/15 1346 Last data filed at 03/12/15 1108  Gross per 24  hour  Intake    120 ml  Output    300 ml  Net   -180 ml   Blood pressure 131/58, pulse 88, temperature 98.7 F (37.1 C), temperature source Oral, resp. rate 20, height 5\' 3"  (1.6 m), weight 187 lb 13.3 oz (85.2 kg), SpO2 98 %. Temp:  [98.7 F (37.1 C)-99.9 F (37.7 C)] 98.7 F (37.1 C) (09/23 0811) Pulse Rate:  [83-88] 88 (09/23 0811) Resp:  [20] 20 (09/23 0811) BP: (117-131)/(47-63) 131/58 mmHg (09/23 0811) SpO2:  [98 %-99 %] 98 % (09/23 0811)  Physical Exam: General: Alert and awake, oriented x3, not in any acute distress. HEENT: anicteric sclera,  EOMI CVS regular rate, normal r,  no murmur rubs or gallops Chest: clear to auscultation bilaterally, no wheezing, rales or rhonchi gastrointestinal: soft nontender, nondistended, normal bowel  sounds, MSK/skin: left leg wrapped with bandage   Neuro: paraplegic no new changes  CBC:  CBC Latest Ref Rng 03/05/2015 03/04/2015 03/03/2015  WBC 4.0 - 10.5 K/uL 18.3(H) 35.3(H) 30.9(H)  Hemoglobin 12.0 - 15.0 g/dL 10.6(L) 10.5(L) 9.7(L)  Hematocrit 36.0 - 46.0 % 30.7(L) 30.0(L) 27.9(L)  Platelets 150 - 400 K/uL 348 379 339       BMET No results for input(s): NA, K, CL, CO2, GLUCOSE, BUN, CREATININE, CALCIUM in the last 72 hours.   Liver Panel  No results for input(s): PROT, ALBUMIN, AST, ALT, ALKPHOS, BILITOT, BILIDIR, IBILI in the last 72 hours.     Sedimentation Rate No results for input(s): ESRSEDRATE in the last 72 hours. C-Reactive Protein No results for input(s): CRP in the last 72 hours.  Micro Results: Recent Results (from the past 720 hour(s))  Urine culture     Status: None   Collection Time: 02/17/15  8:53 PM  Result Value Ref Range Status   Specimen Description URINE, CATHETERIZED  Final   Special Requests Normal  Final   Culture   Final    NO GROWTH 2 DAYS Performed at [facility]    Report Status 02/19/2015 FINAL  Final  Culture, blood (x 2)     Status: None   Collection Time: 03/01/15  1:51 PM  Result Value Ref Range Status   Specimen Description BLOOD LEFT ARM  Final   Special Requests IN PEDIATRIC BOTTLE 3CC  Final   Culture   Final    NO GROWTH 5 DAYS Performed at [facility]    Report Status 03/06/2015 FINAL  Final  Culture, blood (x 2)     Status: None   Collection Time: 03/01/15  1:51 PM  Result Value Ref Range Status   Specimen Description BLOOD RIGHT ARM  Final   Special Requests BOTTLES DRAWN AEROBIC AND ANAEROBIC 10CC  Final   Culture  Setup Time   Final    GRAM NEGATIVE RODS IN BOTH AEROBIC AND ANAEROBIC BOTTLES CRITICAL RESULT CALLED TO, READ BACK BY AND VERIFIED WITH: C CREECH@0650  03/02/15    Culture   Final    CITROBACTER SPECIES Performed at [facility]    Report Status 03/05/2015 FINAL  Final    Organism ID, Bacteria CITROBACTER SPECIES  Final      Susceptibility   Citrobacter species - MIC*    CEFAZOLIN >=64 RESISTANT Resistant     CEFEPIME <=1 SENSITIVE Sensitive     CEFTAZIDIME <=1 SENSITIVE Sensitive     CEFTRIAXONE <=1 SENSITIVE Sensitive     CIPROFLOXACIN <=0.25 SENSITIVE Sensitive     GENTAMICIN <=1 SENSITIVE Sensitive  IMIPENEM 2 SENSITIVE Sensitive     TRIMETH/SULFA <=20 SENSITIVE Sensitive     PIP/TAZO <=4 SENSITIVE Sensitive     * CITROBACTER SPECIES  Urine culture     Status: None   Collection Time: 03/01/15  7:23 PM  Result Value Ref Range Status   Specimen Description URINE, CATHETERIZED  Final   Special Requests NONE  Final   Culture   Final    80,000 COLONIES/ml ESCHERICHIA COLI Performed at [facility]    Report Status 03/04/2015 FINAL  Final   Organism ID, Bacteria ESCHERICHIA COLI  Final      Susceptibility   Escherichia coli - MIC*    AMPICILLIN >=32 RESISTANT Resistant     CEFAZOLIN <=4 SENSITIVE Sensitive     CEFTRIAXONE <=1 SENSITIVE Sensitive     CIPROFLOXACIN >=4 RESISTANT Resistant     GENTAMICIN <=1 SENSITIVE Sensitive     IMIPENEM <=0.25 SENSITIVE Sensitive     NITROFURANTOIN <=16 SENSITIVE Sensitive     TRIMETH/SULFA <=20 SENSITIVE Sensitive     AMPICILLIN/SULBACTAM >=32 RESISTANT Resistant     PIP/TAZO 8 SENSITIVE Sensitive     * 80,000 COLONIES/ml ESCHERICHIA COLI  MRSA PCR Screening     Status: Abnormal   Collection Time: 03/01/15  9:34 PM  Result Value Ref Range Status   MRSA by PCR POSITIVE (A) NEGATIVE Final    Comment:        The GeneXpert MRSA Assay (FDA approved for NASAL specimens only), is one component of a comprehensive MRSA colonization surveillance program. It is not intended to diagnose MRSA infection nor to guide or monitor treatment for MRSA infections. RESULT CALLED TO, READ BACK BY AND VERIFIED WITH: M.REEVES,RN AT 2334 ON 03/02/15 BY W.SHEA     Studies/Results: No results  found.    Assessment/Plan:  INTERVAL HISTORY:   03/11/15:  contiues to not have fevers   Principal Problem:   Septic shock Active Problems:   Hypokalemia   Multiple sclerosis   Sepsis due to Escherichia coli with acute renal failure   E. coli UTI (urinary tract infection)   Diabetes mellitus with peripheral vascular disease   Leukocytosis   Hypophosphatemia   Hypomagnesemia   Anemia of chronic disease   Decubitus ulcer, stage 2   Lymphedema   Citrobacter infection   Fever   Back pain   Proctitis    Jocelyn Sanchez is a 59 y.o. female with  Admission with cellulitis surrounding chronic wound on left leg and chronic venous stasis changes, then fevers that persisted but now gone. Thi is now the 2nd day off all abx without a fever  I will not pursue further workup at this time  From my standpoint in ID safe to DC home.  I will sign off.   Please call with further questions.   LOS: 11 days   Alcide Evener 03/12/2015, 1:46 PM

## 2015-03-12 NOTE — Discharge Summary (Signed)
Physician Discharge Summary  Jocelyn Sanchez XBD:[id number] DOB: Nov 19, 1955 DOA: 03/01/2015  PCP: [name], MD  Admit date: 03/01/2015 Discharge date: 03/12/2015  Recommendations for Outpatient Follow-up:  1. Please note we recommended holding Benicar and continue midodrine twice a day until you are seen by PCP to recheck blood pressure to make sure it is stable. 2. Take tylenol (not more than 4-6 times a day) for fever.    Discharge Diagnoses:  Principal Problem:   Septic shock Active Problems:   Sepsis due to Escherichia coli with acute renal failure   E. coli UTI (urinary tract infection)   Fever   Proctitis   Hypokalemia   Multiple sclerosis   Diabetes mellitus with peripheral vascular disease   Leukocytosis   Hypophosphatemia   Hypomagnesemia   Anemia of chronic disease   Decubitus ulcer, stage 2   Lymphedema   Citrobacter infection   Back pain    Discharge Condition: stable, pt insists on going home today. HH orders all placed, HHRN, PT/OT, aide  Diet recommendation: as tolerated   History of present illness:  59 y.o. female with past medical history of multiple sclerosis, diabetes mellitus type 2, hypertension, lower extremity lymphedema who presented to [facility] ED 03/01/15 with initial concern for low back pain. She was subsequently founds to have citrobacter bacteremia and E.Coli UTI and has developed a septic shock. She was on PCCM service through 9/16 and transferred to [facility] 9/17.  SIGNIFICANT EVENTS  9/12 - Admission to hospital 9/12 - Transfusion of PRBC 9/13 - Radial arterial line placed. 9/14 - Radial arterial line discontinued. 9/17 - Care transitioned to [facility] 9/18 - Spiking fevers. CT abd and chest ordered for eval of fever - severe proctitis 9/19 - Consulted GI and ID. Broadened abx coverage to ceftazidime and added flagyl. 9/20 - Flex sigmoidoscopy done, no proctitis 9/21 - ID recommended stopping abx 9/22 9/22 - Stopped abx   Hospital Course:    Assessment/Plan:    Principal Problem: Septic shock secondary to E coli UTI and Citrobacter bacteremia / Severe sepsis  - Patient found to have septic shock on the admission secondary to Escherichia coli urinary tract infection and Citrobacter bacteremia.  - TTE done and this was negative for vegetations. - Patient was under the CCM care through 03/05/2015.  - Care transitioned to [facility] 03/06/2015.  - Please refer to abx section for abx given throughout the hospital stay. - She has spiked fevers throughout the hospital stay. As part of the fever workup and ongoing fevers, on 03/07/2015 we ordered chest x-ray and CT abdomen, pelvis and chest. She was found to have severe proctitis on CT abdomen. - We consulted infectious disease and GI on 03/08/2015. ID recommended adding Flagyl and broadening the coverage to [place] (insead of rocephin) - GI recommended obtaining flexible sigmoidoscopy. Flexible sigmoidoscopy on 03/09/2015 did not demonstrate proctitis. - Repeat blood cultures or blood work not done because she has a very difficult IV access because of significant diffuse lymphedema. She has refused right IJ placement and IV team could not place PICC line because of lymphedema. - Most recently, infectious disease recommended stopping ceftazidime and Flagyl 9/22 - No fever overnight and she wants to go home today.  Active Problems: Severe proctitis - As noted above flexible sigmoidoscopy not showing severe proctitis   Acute renal failure complicated by non anion gap metabolic acidosis / Hyperkalemia / Hypokalemia  - Likely due to bacteremia and septic shock. - Creatinine improved however we are unable to  monitor the blood work because patient refuses to be stuck because of the pain from significant lymphedema. As mentioned above unable to insert a PICC line because of lymphedema and patient refused IJ line.  Hypophosphatemia / Hypomagnesemia - Supplemented  Chronic lymphedema with  sacral decubitus ulcer, stage 2 - Patient with long-standing lymphedema and recurrence of pressure injury over area that had been previously healed. Pt also has indwelling urinary catheter and is incontinent of stool. - Wound care has seen the patient in consultation. We appreciated their assessment. Patient has moisture associated skin damage plus pressure and friction in the area of previous ulceration. Measurement:1.5cm x 2cm x 0.2cm Stage 2 in an area measuring 6cm x 8cm of scar tissue  - Dressing recommendations: A soft silicone foam dressing provided; bilateral pressure redistribution heel boots provided to prevent injury in the presence of the weight and immobility resulting from the lymphedema.   Diabetes mellitus type 2 with peripheral vascular manifestations - A1c in 11/2014 - 6.7 indicating good glycemic control  Anemia of chronic disease  - Secondary to multiple sclerosis - Patient received 2 units of PRBC during this hospital stay.  Multiple sclerosis with quadriparesis  - Not on treatment and does not follow with neurology.   Acute low back pain. - No acute findings seen on lumbar spine x-ray - Improved    DVT Prophylaxis  - Heparin subQ ordered while patient in hospital   Code Status: Full.  Family Communication: plan of care discussed with the patient   IV access:  Peripheral IV  Procedures and diagnostic studies:   Dg Chest Port 1 View 03/07/2015 Interval development of bilateral pleural effusions and bilateral lower lobes airspace consolidation versus atelectasis. This represents an acute change from the previous radiograph dated 03/01/2015 Electronically Signed By: Fidela Salisbury M.D. On: 03/07/2015 10:35  Dg Lumbar Spine 2-3 Views 03/01/2015 1. Considerable lumbar spondylosis, scoliosis, and degenerative disc disease with mild subluxations at L3-4 and L4-5. The scoliotic curvature oblique and indistinct on the lateral projection. I do  not see a definite fracture. MRI (or CT) would be a superior way to assess the lumbar spine for impingement given the limitations due to the degree of scoliosis. Electronically Signed By: Van Clines M.D. On: 03/01/2015 18:39   Dg Chest Portable 1 View 03/01/2015 No acute cardiopulmonary disease. Electronically Signed By: Lajean Manes M.D. On: 03/01/2015 14:49    Medical Consultants:  Cardiology PCCM consulted and then primary through 9/16 Infectious disease, Dr. Michel Bickers Gastroenterology, Dr. Tyrone Sage  Other Consultants:  PT evaluation Nutrition WOC  IAnti-Infectives:   Vanco and zosyn stopped 03/05/2015 Rocephin 03/05/2015 --> 03/08/2015  Flagyl 03/08/2015 --> 03/11/2015 Fortaz 03/08/2015 --> 03/11/2015   Signed:  Leisa Lenz, MD  [facility] 03/12/2015, 10:06 AM  Pager #: [phone]  Time spent in minutes: more than 30 minutes   Discharge Exam: Filed Vitals:   03/12/15 0811  BP: 131/58  Pulse: 88  Temp: 98.7 F (37.1 C)  Resp: 20   Filed Vitals:   03/11/15 0630 03/11/15 1500 03/11/15 2202 03/12/15 0811  BP: 134/57 117/47 126/63 131/58  Pulse: 90  83 88  Temp: 99.4 F (37.4 C) 99.8 F (37.7 C) 99.9 F (37.7 C) 98.7 F (37.1 C)  TempSrc: Oral Oral Oral Oral  Resp: 20 20 20 20   Height:      Weight:      SpO2: 96% 98% 99% 98%    General: Pt is alert, follows commands appropriately, not in acute distress  Cardiovascular: Regular rate and rhythm, S1/S2 + Respiratory: Clear to auscultation bilaterally, no wheezing, no crackles, no rhonchi Abdominal: Soft, non tender, non distended, bowel sounds +, no guarding Extremities: lymphedema throughout, pulses palpable bilaterally DP and PT Neuro: Grossly nonfocal  Discharge Instructions  Discharge Instructions    Call MD for:  difficulty breathing, headache or visual disturbances    Complete by:  As directed      Call MD for:  persistant dizziness or light-headedness     Complete by:  As directed      Call MD for:  persistant nausea and vomiting    Complete by:  As directed      Call MD for:  severe uncontrolled pain    Complete by:  As directed      Diet - low sodium heart healthy    Complete by:  As directed      Discharge instructions    Complete by:  As directed   1. Please note we recommended holding Benicar and continue midodrine twice a day until you are seen by PCP to recheck blood pressure to make sure it is stable. 2. Take tylenol (not more than 4-6 times a day) for fever.     Increase activity slowly    Complete by:  As directed             Medication List    STOP taking these medications        BENICAR HCT 20-12.5 MG per tablet  Generic drug:  olmesartan-hydrochlorothiazide     diphenhydramine-acetaminophen 25-500 MG Tabs  Commonly known as:  TYLENOL PM     ferrous sulfate 325 (65 FE) MG tablet  Commonly known as:  FERROUSUL     ibuprofen 400 MG tablet  Commonly known as:  ADVIL,MOTRIN      TAKE these medications        acetaminophen 325 MG tablet  Commonly known as:  TYLENOL  Take 2 tablets (650 mg total) by mouth every 6 (six) hours as needed for mild pain (or Fever >/= 101).     aspirin 81 MG EC tablet  Take 1 tablet (81 mg total) by mouth daily.     baclofen 10 MG tablet  Commonly known as:  LIORESAL  Take 0.5 tablets (5 mg total) by mouth 3 (three) times daily.     feeding supplement (PRO-STAT SUGAR FREE 64) Liqd  Take 30 mLs by mouth 2 (two) times daily.     glipiZIDE 5 MG tablet  Commonly known as:  GLUCOTROL  Take 0.5 tablets (2.5 mg total) by mouth daily before breakfast.     HYDROcodone-acetaminophen 5-325 MG per tablet  Commonly known as:  NORCO/VICODIN  Take 2 tablets by mouth every 4 (four) hours as needed for moderate pain.     methocarbamol 500 MG tablet  Commonly known as:  ROBAXIN  Take 0.5 tablets (250 mg total) by mouth every 8 (eight) hours as needed for muscle spasms.     midodrine 2.5 MG  tablet  Commonly known as:  PROAMATINE  Take 1 tablet (2.5 mg total) by mouth 2 (two) times daily with a meal.     pantoprazole 40 MG tablet  Commonly known as:  PROTONIX  Take 1 tablet (40 mg total) by mouth daily.     zolpidem 5 MG tablet  Commonly known as:  AMBIEN  Take 1 tablet (5 mg total) by mouth at bedtime as needed for sleep (insomnia).  Follow-up Information    Follow up with [facility], MD. Schedule an appointment as soon as possible for a visit in 1 week.   Specialty:  Cardiology   Why:  Follow up appt after recent hospitalization   Contact information:   [address] [place] [id number] [phone]        The results of significant diagnostics from this hospitalization (including imaging, microbiology, ancillary and laboratory) are listed below for reference.    Significant Diagnostic Studies: Dg Lumbar Spine 2-3 Views  03/01/2015   CLINICAL DATA:  Mid back pain beginning 1 week ago.  EXAM: LUMBAR SPINE - 2-3 VIEW  COMPARISON:  04/05/2012  FINDINGS: Dextroconvex thoracolumbar scoliosis with rotary component. Loss of intervertebral disc height at multiple levels. The angulation of the lumbar spine causes the lateral projection to be oblique, causing poor definition of vertebral endplate cortical margins on the lateral projection.  Grade 1 anterolisthesis at L4-5, likely degenerative. Grade 1 retrolisthesis at L3-4 is suggested.  Aortoiliac atherosclerotic vascular disease. There is considerable loss of intervertebral disc height throughout the lumbar spine but with some sparing at the L1- 2 level.  IMPRESSION: 1. Considerable lumbar spondylosis, scoliosis, and degenerative disc disease with mild subluxations at L3-4 and L4-5. The scoliotic curvature oblique and indistinct on the lateral projection. I do not see a definite fracture. MRI (or CT) would be a superior way to assess the lumbar spine for impingement given the limitations due to the  degree of scoliosis.   Electronically Signed   By: Van Clines M.D.   On: 03/01/2015 18:39   Ct Chest W Contrast  03/07/2015   CLINICAL DATA:  Septic shock. Fever and elevated white blood cell count.  EXAM: CT CHEST, ABDOMEN, AND PELVIS WITH CONTRAST  TECHNIQUE: Multidetector CT imaging of the chest, abdomen and pelvis was performed following the standard protocol during bolus administration of intravenous contrast.  CONTRAST:  53mL OMNIPAQUE IOHEXOL 300 MG/ML SOLN, 110mL OMNIPAQUE IOHEXOL 300 MG/ML SOLN  COMPARISON:  None.  FINDINGS: CT CHEST FINDINGS  Chest wall: No breast masses, supraclavicular or axillary lymphadenopathy. Small scattered lymph nodes are noted. The thyroid gland appears normal. The bony thorax is intact. No destructive bone lesions or spinal canal compromise.  Mediastinum: The heart is normal in size. No pericardial effusion. The aorta is normal in caliber. No dissection. Moderate scattered atherosclerotic calcifications. Three-vessel coronary artery calcifications are noted. Accessory hemi azygos vein is noted on the left. No mediastinal or hilar mass or adenopathy. The esophagus is grossly normal.  Lungs/pleura: Moderate bilateral pleural effusions with overlying atelectasis. No pulmonary edema or worrisome pulmonary lesions. No definite infiltrates.  CT ABDOMEN AND PELVIS FINDINGS  Hepatobiliary: No focal hepatic lesions or intrahepatic biliary dilatation. Possible mild gallbladder wall thickening but no pericholecystic fluid. No common bile duct dilatation.  Pancreas: No mass, inflammation or ductal dilatation.  Spleen: Normal size.  No focal lesions.  Adrenals/Urinary Tract: The adrenal glands and kidneys are unremarkable. A right renal calculus is noted. No obstructing ureteral calculi. No renal mass or evidence of pyelonephritis.  Stomach/Bowel: The stomach, duodenum, small bowel and colon are grossly normal. No inflammatory changes, mass lesions or lymphadenopathy. Presacral  and extensive perirectal soft tissue thickening suspicious for severe proctitis. The appendix is normal.  Vascular/Lymphatic: No mesenteric or retroperitoneal mass or adenopathy. Small scattered lymph nodes are noted. Advanced atherosclerotic calcifications involving the aorta and branch vessels but no dissection or focal aneurysm.  Other: No pelvic mass or lymphadenopathy. There  are uterine fibroids noted. The bladder contains a Foley catheter. Areas noted in the bladder. No pelvic adenopathy or inguinal adenopathy.  There is diffuse subcutaneous soft tissue swelling/edema/ fluid which may suggest anasarca are cellulitis. There is a small amount of free abdominal and free pelvic fluid.  Musculoskeletal: Severe scoliosis. No acute bony findings or destructive bony changes.  IMPRESSION: 1. Moderate bilateral pleural effusions with overlying atelectasis but no worrisome pulmonary lesions or definite infiltrates. 2. Marked diffuse rectal wall thickening suggesting severe proctitis. No ulcer, mass or perforation. 3. Advanced atherosclerotic calcifications involving the aorta and branch vessels. 4. Right renal calculus but no obstructing ureteral calculi. 5. No abdominal/pelvic mass or adenopathy. 6. Uterine fibroids. 7. Diffuse subcutaneous soft tissue swelling/edema/fluid.   Electronically Signed   By: Marijo Sanes M.D.   On: 03/07/2015 14:18   Ct Abdomen Pelvis W Contrast  03/07/2015   CLINICAL DATA:  Septic shock. Fever and elevated white blood cell count.  EXAM: CT CHEST, ABDOMEN, AND PELVIS WITH CONTRAST  TECHNIQUE: Multidetector CT imaging of the chest, abdomen and pelvis was performed following the standard protocol during bolus administration of intravenous contrast.  CONTRAST:  30mL OMNIPAQUE IOHEXOL 300 MG/ML SOLN, 167mL OMNIPAQUE IOHEXOL 300 MG/ML SOLN  COMPARISON:  None.  FINDINGS: CT CHEST FINDINGS  Chest wall: No breast masses, supraclavicular or axillary lymphadenopathy. Small scattered lymph nodes  are noted. The thyroid gland appears normal. The bony thorax is intact. No destructive bone lesions or spinal canal compromise.  Mediastinum: The heart is normal in size. No pericardial effusion. The aorta is normal in caliber. No dissection. Moderate scattered atherosclerotic calcifications. Three-vessel coronary artery calcifications are noted. Accessory hemi azygos vein is noted on the left. No mediastinal or hilar mass or adenopathy. The esophagus is grossly normal.  Lungs/pleura: Moderate bilateral pleural effusions with overlying atelectasis. No pulmonary edema or worrisome pulmonary lesions. No definite infiltrates.  CT ABDOMEN AND PELVIS FINDINGS  Hepatobiliary: No focal hepatic lesions or intrahepatic biliary dilatation. Possible mild gallbladder wall thickening but no pericholecystic fluid. No common bile duct dilatation.  Pancreas: No mass, inflammation or ductal dilatation.  Spleen: Normal size.  No focal lesions.  Adrenals/Urinary Tract: The adrenal glands and kidneys are unremarkable. A right renal calculus is noted. No obstructing ureteral calculi. No renal mass or evidence of pyelonephritis.  Stomach/Bowel: The stomach, duodenum, small bowel and colon are grossly normal. No inflammatory changes, mass lesions or lymphadenopathy. Presacral and extensive perirectal soft tissue thickening suspicious for severe proctitis. The appendix is normal.  Vascular/Lymphatic: No mesenteric or retroperitoneal mass or adenopathy. Small scattered lymph nodes are noted. Advanced atherosclerotic calcifications involving the aorta and branch vessels but no dissection or focal aneurysm.  Other: No pelvic mass or lymphadenopathy. There are uterine fibroids noted. The bladder contains a Foley catheter. Areas noted in the bladder. No pelvic adenopathy or inguinal adenopathy.  There is diffuse subcutaneous soft tissue swelling/edema/ fluid which may suggest anasarca are cellulitis. There is a small amount of free abdominal  and free pelvic fluid.  Musculoskeletal: Severe scoliosis. No acute bony findings or destructive bony changes.  IMPRESSION: 1. Moderate bilateral pleural effusions with overlying atelectasis but no worrisome pulmonary lesions or definite infiltrates. 2. Marked diffuse rectal wall thickening suggesting severe proctitis. No ulcer, mass or perforation. 3. Advanced atherosclerotic calcifications involving the aorta and branch vessels. 4. Right renal calculus but no obstructing ureteral calculi. 5. No abdominal/pelvic mass or adenopathy. 6. Uterine fibroids. 7. Diffuse subcutaneous soft tissue swelling/edema/fluid.   Electronically  Signed   By: Marijo Sanes M.D.   On: 03/07/2015 14:18   Dg Chest Port 1 View  03/07/2015   CLINICAL DATA:  Patient is febrile.  EXAM: PORTABLE CHEST - 1 VIEW  COMPARISON:  03/01/2015  FINDINGS: The cardiac silhouette is normal for portable technique. There has been interval development of bilateral pleural effusions and bibasilar airspace consolidation versus atelectasis. No evidence of pneumothorax. Soft tissues and osseous structures are without acute abnormality.  IMPRESSION: Interval development of bilateral pleural effusions and bilateral lower lobes airspace consolidation versus atelectasis. This represents an acute change from the previous radiograph dated 03/01/2015   Electronically Signed   By: Fidela Salisbury M.D.   On: 03/07/2015 10:35   Dg Chest Portable 1 View  03/01/2015   CLINICAL DATA:  Per the patient the mid back pain began a week ago, and briefly became better before becoming worse again today. The patient reports experiencing nausea and 1 episode of emesis this AM.  EXAM: PORTABLE CHEST - 1 VIEW  COMPARISON:  11/28/2014  FINDINGS: Cardiac silhouette normal in size and configuration. Aorta is mildly uncoiled. No mediastinal or hilar masses or evidence of adenopathy. Clear lungs. No pleural effusion or pneumothorax.  Bony thorax is grossly intact.  IMPRESSION: No  acute cardiopulmonary disease.   Electronically Signed   By: Lajean Manes M.D.   On: 03/01/2015 14:49    Microbiology: No results found for this or any previous visit (from the past 240 hour(s)).   Labs: Basic Metabolic Panel: No results for input(s): NA, K, CL, CO2, GLUCOSE, BUN, CREATININE, CALCIUM, MG, PHOS in the last 168 hours. Liver Function Tests: No results for input(s): AST, ALT, ALKPHOS, BILITOT, PROT, ALBUMIN in the last 168 hours. No results for input(s): LIPASE, AMYLASE in the last 168 hours. No results for input(s): AMMONIA in the last 168 hours. CBC: No results for input(s): WBC, NEUTROABS, HGB, HCT, MCV, PLT in the last 168 hours. Cardiac Enzymes: No results for input(s): CKTOTAL, CKMB, CKMBINDEX, TROPONINI in the last 168 hours. BNP: BNP (last 3 results)  Recent Labs  03/01/15 1326  BNP 26.5    ProBNP (last 3 results) No results for input(s): PROBNP in the last 8760 hours.  CBG:  Recent Labs Lab 03/11/15 0753 03/11/15 1154 03/11/15 1714 03/11/15 2141 03/12/15 0814  GLUCAP 105* 116* 108* 124* 90

## 2015-03-12 NOTE — Care Management Note (Signed)
Case Management Note  Patient Details  Name: SABRYN PRESLAR MRN: [id number] Date of Birth: [birth year]  Subjective/Objective: Patient declines SNF. Wants home w/HHC.[facility] chosen-TC Kristen rep aware of referral & d/c. HHRN/PT/OT/aide/social Insurance underwriter.Await HHC, face to face orders.Ambulance transp-CSW notified.                   Action/Plan:d/c home w/HHC.   Expected Discharge Date:                Expected Discharge Plan:  [place]  In-House Referral:  Clinical Social Work  Discharge planning Services  CM Consult  Post Acute Care Choice:  Resumption of Svcs/PTA Provider Choice offered to:  NA, Patient  DME Arranged:  N/A DME Agency:  NA  HH Arranged:  [facility]S Program, RN, PT, OT, Nurse's Aide, Social Work [employer] Agency:  Other - See comment, [place]  Status of Service:  Completed, signed off  Medicare Important Message Given:  Yes-third notification given Date Medicare IM Given:    Medicare IM give by:    Date Additional Medicare IM Given:    Additional Medicare Important Message give by:     If discussed at [place] of Stay Meetings, dates discussed:    Additional Comments:  [name], RN 03/12/2015, 10:04 AM

## 2015-03-21 ENCOUNTER — Encounter (HOSPITAL_COMMUNITY): Payer: Self-pay | Admitting: Emergency Medicine

## 2015-03-21 ENCOUNTER — Emergency Department (HOSPITAL_COMMUNITY): Payer: Medicare Other

## 2015-03-21 ENCOUNTER — Emergency Department (HOSPITAL_COMMUNITY)
Admission: EM | Admit: 2015-03-21 | Discharge: 2015-03-21 | Disposition: A | Discharge status: Home / Self Care | Payer: Medicare Other | Attending: Emergency Medicine | Admitting: Emergency Medicine

## 2015-03-21 DIAGNOSIS — W19XXXA Unspecified fall, initial encounter: Secondary | ICD-10-CM

## 2015-03-21 DIAGNOSIS — Y998 Other external cause status: Secondary | ICD-10-CM | POA: Insufficient documentation

## 2015-03-21 DIAGNOSIS — I10 Essential (primary) hypertension: Secondary | ICD-10-CM | POA: Diagnosis not present

## 2015-03-21 DIAGNOSIS — S4991XA Unspecified injury of right shoulder and upper arm, initial encounter: Secondary | ICD-10-CM | POA: Diagnosis not present

## 2015-03-21 DIAGNOSIS — E11649 Type 2 diabetes mellitus with hypoglycemia without coma: Secondary | ICD-10-CM | POA: Diagnosis not present

## 2015-03-21 DIAGNOSIS — R52 Pain, unspecified: Secondary | ICD-10-CM

## 2015-03-21 DIAGNOSIS — E162 Hypoglycemia, unspecified: Secondary | ICD-10-CM

## 2015-03-21 DIAGNOSIS — Z87440 Personal history of urinary (tract) infections: Secondary | ICD-10-CM | POA: Insufficient documentation

## 2015-03-21 DIAGNOSIS — Z8669 Personal history of other diseases of the nervous system and sense organs: Secondary | ICD-10-CM | POA: Diagnosis not present

## 2015-03-21 DIAGNOSIS — Z79899 Other long term (current) drug therapy: Secondary | ICD-10-CM | POA: Insufficient documentation

## 2015-03-21 DIAGNOSIS — M79672 Pain in left foot: Secondary | ICD-10-CM

## 2015-03-21 DIAGNOSIS — Y9289 Other specified places as the place of occurrence of the external cause: Secondary | ICD-10-CM | POA: Diagnosis not present

## 2015-03-21 DIAGNOSIS — M79601 Pain in right arm: Secondary | ICD-10-CM

## 2015-03-21 DIAGNOSIS — Z87891 Personal history of nicotine dependence: Secondary | ICD-10-CM | POA: Diagnosis not present

## 2015-03-21 DIAGNOSIS — Y9389 Activity, other specified: Secondary | ICD-10-CM | POA: Insufficient documentation

## 2015-03-21 DIAGNOSIS — W050XXA Fall from non-moving wheelchair, initial encounter: Secondary | ICD-10-CM | POA: Diagnosis not present

## 2015-03-21 DIAGNOSIS — Z862 Personal history of diseases of the blood and blood-forming organs and certain disorders involving the immune mechanism: Secondary | ICD-10-CM | POA: Diagnosis not present

## 2015-03-21 DIAGNOSIS — S99922A Unspecified injury of left foot, initial encounter: Secondary | ICD-10-CM | POA: Insufficient documentation

## 2015-03-21 LAB — CBG MONITORING, ED
GLUCOSE-CAPILLARY: 82 mg/dL (ref 65–99)
Glucose-Capillary: 59 mg/dL — ABNORMAL LOW (ref 65–99)
Glucose-Capillary: 87 mg/dL (ref 65–99)

## 2015-03-21 LAB — CBC WITH DIFFERENTIAL/PLATELET
BASOS ABS: 0.1 10*3/uL (ref 0.0–0.1)
Basophils Relative: 0 %
EOS PCT: 2 %
Eosinophils Absolute: 0.3 10*3/uL (ref 0.0–0.7)
HCT: 33.7 % — ABNORMAL LOW (ref 36.0–46.0)
Hemoglobin: 10.9 g/dL — ABNORMAL LOW (ref 12.0–15.0)
LYMPHS PCT: 15 %
Lymphs Abs: 2.1 10*3/uL (ref 0.7–4.0)
MCH: 27.1 pg (ref 26.0–34.0)
MCHC: 32.3 g/dL (ref 30.0–36.0)
MCV: 83.8 fL (ref 78.0–100.0)
Monocytes Absolute: 1.3 10*3/uL — ABNORMAL HIGH (ref 0.1–1.0)
Monocytes Relative: 9 %
Neutro Abs: 10.5 10*3/uL — ABNORMAL HIGH (ref 1.7–7.7)
Neutrophils Relative %: 74 %
PLATELETS: 849 10*3/uL — AB (ref 150–400)
RBC: 4.02 MIL/uL (ref 3.87–5.11)
RDW: 16.4 % — ABNORMAL HIGH (ref 11.5–15.5)
WBC: 14.3 10*3/uL — AB (ref 4.0–10.5)

## 2015-03-21 LAB — COMPREHENSIVE METABOLIC PANEL
ALT: 12 U/L — AB (ref 14–54)
AST: 32 U/L (ref 15–41)
Albumin: 2.3 g/dL — ABNORMAL LOW (ref 3.5–5.0)
Alkaline Phosphatase: 94 U/L (ref 38–126)
Anion gap: 7 (ref 5–15)
BUN: 10 mg/dL (ref 6–20)
CHLORIDE: 103 mmol/L (ref 101–111)
CO2: 24 mmol/L (ref 22–32)
CREATININE: 0.37 mg/dL — AB (ref 0.44–1.00)
Calcium: 8.3 mg/dL — ABNORMAL LOW (ref 8.9–10.3)
GFR calc Af Amer: >60 mL/min (ref >60)
GFR calc non Af Amer: >60 mL/min (ref >60)
Glucose, Bld: 75 mg/dL (ref 65–99)
Potassium: 4.8 mmol/L (ref 3.5–5.1)
SODIUM: 134 mmol/L — AB (ref 135–145)
Total Bilirubin: 0.5 mg/dL (ref 0.3–1.2)
Total Protein: 7.1 g/dL (ref 6.5–8.1)

## 2015-03-21 MED ORDER — SODIUM CHLORIDE 0.9 % IV SOLN
INTRAVENOUS | Status: DC
  Administered 2015-03-21: 06:00:00 via INTRAVENOUS

## 2015-03-21 MED ORDER — ACETAMINOPHEN 325 MG PO TABS
650.0000 mg | ORAL_TABLET | Freq: Once | ORAL | Status: AC
  Administered 2015-03-21: 650 mg via ORAL
  Filled 2015-03-21: qty 2

## 2015-03-21 NOTE — ED Notes (Signed)
Pt was given 240 ml of orange juice---- tolerated well.

## 2015-03-21 NOTE — ED Notes (Signed)
Patient called EMS to the her house because she was having pain all over. Patient was alert and oriented the whole time. The blood sugar was checked. The blood sugar 26 initially. Patient was giving po things to help bring sugar up. Patient was given two tubes of instant glucose

## 2015-03-21 NOTE — ED Notes (Signed)
Bed: WA10 Expected date:  Expected time:  Means of arrival:  Comments: EMS 

## 2015-03-21 NOTE — Discharge Instructions (Signed)
Read the information below.  You may return to the Emergency Department at any time for worsening condition or any new symptoms that concern you.  Please stop your diabetic medications and monitor your blood sugar closely.  Try to eat multiple small meals per day.   Please make an appointment to see Dr Doylene Canard in the next 1-2 days to discuss your diabetes treatment.    When you wound care team comes to take care of you tomorrow, please ask them to thoroughly pad your left heel or order protective boots for you.     Low Blood Sugar Low blood sugar (hypoglycemia) means that the level of sugar in your blood is lower than it should be. Signs of low blood sugar include:  Getting sweaty.  Feeling hungry.  Feeling dizzy or weak.  Feeling sleepier than normal.  Feeling nervous.  Headaches.  Having a fast heartbeat. Low blood sugar can happen fast and can be an emergency. Your doctor can do tests to check your blood sugar level. You can have low blood sugar and not have diabetes. HOME CARE  Check your blood sugar as told by your doctor. If it is less than 70 mg/dl or as told by your doctor, take 1 of the following:  3 to 4 glucose tablets.   cup clear juice.   cup soda pop, not diet.  1 cup milk.  5 to 6 hard candies.  Recheck blood sugar after 15 minutes. Repeat until it is at the right level.  Eat a snack if it is more than 1 hour until the next meal.  Only take medicine as told by your doctor.  Do not skip meals. Eat on time.  Do not drink alcohol except with meals.  Check your blood glucose before driving.  Check your blood glucose before and after exercise.  Always carry treatment with you, such as glucose pills.  Always wear a medical alert bracelet if you have diabetes. GET HELP RIGHT AWAY IF:   Your blood glucose goes below 70 mg/dl or as told by your doctor, and you:  Are confused.  Are not able to swallow.  Pass out (faint).  You cannot treat  yourself. You may need someone to help you.  You have low blood sugar problems often.  You have problems from your medicines.  You are not feeling better after 3 to 4 days.  You have vision changes. MAKE SURE YOU:   Understand these instructions.  Will watch this condition.  Will get help right away if you are not doing well or get worse. Document Released: 08/30/2009 Document Revised: 08/28/2011 Document Reviewed: 08/30/2009 [facility] Patient Information 2015 [place], [place]. This information is not intended to replace advice given to you by your health care provider. Make sure you discuss any questions you have with your health care provider.

## 2015-03-21 NOTE — ED Provider Notes (Signed)
CSN: [id number]     Arrival date & time 03/21/15  0515 History   First MD Initiated Contact with Patient 03/21/15 0600     Chief Complaint  Patient presents with  . Hypoglycemia    Patient called EMS to the her house because she was having pain all over. Patient was alert and oriented the whole time. The blood sugar was checked. The blood sugar 26 initially. Patient was giving po things to help bring sugar up. Patient was given two tubes of instant glucose     (Consider location/radiation/quality/duration/timing/severity/associated sxs/prior Treatment) The history is provided by the patient and medical records.     Pt with hx DM on glipizide, MS and lymphedema p/w hypoglycemia.  Per family member, pt is having frequent, nearly daily, episodes of hypoglycemia. Has had EMS come to house several times to check blood sugar or is given orange juice by family members.  Has had decreased appetite.  She takes a 1/2 tab glipizide twice daily.  Per family member, she eats very little.  Last took her glipizide around 6 or 7pm yesterday. Was previously on metformin but could not tolerate it due to diarrhea.   Pt also notes fall 2 days ago from wheelchair with persistent pain in her right hand and right elbow.  She also has a cut in her right ear.  Denies headache or any other injury.  Denies numbness.  Pt has chronic unchanged weakness from MS.   Pt recently admitted for sepsis, treated for UTI and found to have proctitis on CT scan.  Pt denies any urinary symptoms at all.  Denies any leg pain or change in the way her chronic leg swelling has looked.    Past Medical History  Diagnosis Date  . MS (multiple sclerosis) ([place])   . HTN (hypertension)   . DM II (diabetes mellitus, type II), controlled ([place])   . Lymphedema   . Anemia   . Gait disorder    Past Surgical History  Procedure Laterality Date  . Flexible sigmoidoscopy Left 03/09/2015    Procedure: FLEXIBLE SIGMOIDOSCOPY;  Surgeon: Carol Ada,  MD;  Location: [facility];  Service: Endoscopy;  Laterality: Left;   Family History  Problem Relation Age of Onset  . Multiple sclerosis Mother    Social History  Substance Use Topics  . Smoking status: Former Smoker    Types: Cigarettes  . Smokeless tobacco: Never Used  . Alcohol Use: No   OB History    Gravida Para Term Preterm AB TAB SAB Ectopic Multiple Living   3 3 0 0 0 0 0 0 0 0      Review of Systems  All other systems reviewed and are negative.     Allergies  Sulfa antibiotics  Home Medications   Prior to Admission medications   Medication Sig Start Date End Date Taking? Authorizing Provider  acetaminophen (TYLENOL) 325 MG tablet Take 2 tablets (650 mg total) by mouth every 6 (six) hours as needed for mild pain (or Fever >/= 101). 03/12/15   Robbie Lis, MD  Amino Acids-Protein Hydrolys (FEEDING SUPPLEMENT, PRO-STAT SUGAR FREE 64,) LIQD Take 30 mLs by mouth 2 (two) times daily. 03/12/15   Robbie Lis, MD  aspirin EC 81 MG EC tablet Take 1 tablet (81 mg total) by mouth daily. 12/04/14   Dixie Dials, MD  baclofen (LIORESAL) 10 MG tablet Take 0.5 tablets (5 mg total) by mouth 3 (three) times daily. 05/14/13   Dennie Bible, NP  glipiZIDE (GLUCOTROL) 5 MG tablet Take 0.5 tablets (2.5 mg total) by mouth daily before breakfast. 03/12/15   Robbie Lis, MD  HYDROcodone-acetaminophen (NORCO/VICODIN) 5-325 MG per tablet Take 2 tablets by mouth every 4 (four) hours as needed for moderate pain. 03/12/15   Robbie Lis, MD  methocarbamol (ROBAXIN) 500 MG tablet Take 0.5 tablets (250 mg total) by mouth every 8 (eight) hours as needed for muscle spasms. 03/12/15   Robbie Lis, MD  midodrine (PROAMATINE) 2.5 MG tablet Take 1 tablet (2.5 mg total) by mouth 2 (two) times daily with a meal. 03/12/15   Robbie Lis, MD  pantoprazole (PROTONIX) 40 MG tablet Take 1 tablet (40 mg total) by mouth daily. 03/12/15   Robbie Lis, MD  zolpidem (AMBIEN) 5 MG tablet Take 1 tablet (5  mg total) by mouth at bedtime as needed for sleep (insomnia). 03/12/15   Robbie Lis, MD   BP 108/63 mmHg  Pulse 116  Temp(Src) 97.7 F (36.5 C) (Oral)  Resp 18  Ht 5\' 3"  (1.6 m)  Wt 175 lb (79.379 kg)  BMI 31.01 kg/m2  SpO2 100% Physical Exam  Constitutional: She appears well-developed and well-nourished. No distress.  Arms chronically contracted.    HENT:  Head: Normocephalic and atraumatic.  Neck: Neck supple.  Cardiovascular: Normal rate and regular rhythm.   Pulmonary/Chest: Effort normal and breath sounds normal. No respiratory distress. She has no wheezes. She has no rales.  Abdominal: Soft. She exhibits no distension. There is no tenderness. There is no rebound and no guarding.  Genitourinary:  Chronic decubitus ulcers over the sacrum and buttocks without erythema or edema or any discharge.  Pt has brown stool around anus.    Musculoskeletal:  Significant bilateral lower extremity edema with weeping through the ACE bandages and wrapping around them - she eventually allowed me to remove only the left one due to pain in her left posterior heel.  Posterior heel with mild erythema, tenderness.  No fluctuance or induration.  Entire left lower leg with significant edema and chronic skin changes, nontender, no discharge.    Neurological: She is alert.  Pt does not move legs at baseline  Skin: She is not diaphoretic.  Psychiatric: She has a normal mood and affect. Her behavior is normal.  Nursing note and vitals reviewed.   ED Course  Procedures (including critical care time) Labs Review Labs Reviewed  CBC WITH DIFFERENTIAL/PLATELET - Abnormal; Notable for the following:    WBC 14.3 (*)    Hemoglobin 10.9 (*)    HCT 33.7 (*)    RDW 16.4 (*)    Platelets 849 (*)    Neutro Abs 10.5 (*)    Monocytes Absolute 1.3 (*)    All other components within normal limits  COMPREHENSIVE METABOLIC PANEL - Abnormal; Notable for the following:    Sodium 134 (*)    Creatinine, Ser 0.37  (*)    Calcium 8.3 (*)    Albumin 2.3 (*)    ALT 12 (*)    All other components within normal limits  CBG MONITORING, ED - Abnormal; Notable for the following:    Glucose-Capillary 59 (*)    All other components within normal limits  CBG MONITORING, ED  CBG MONITORING, ED  CBG MONITORING, ED    Imaging Review Dg Elbow 2 Views Right  03/21/2015   CLINICAL DATA:  Pain following fall.  EXAM: RIGHT ELBOW - 3 VIEW  COMPARISON:  None.  FINDINGS: Frontal,  oblique and lateral views were obtained. No fracture or dislocation is apparent. There is no demonstrable joint effusion. There is a small spur arising from the olecranon. There is no appreciable joint space narrowing.  IMPRESSION: Small olecranon spur. No demonstrable fracture or dislocation. No appreciable joint effusion.   Electronically Signed   By: Lowella Grip III M.D.   On: 03/21/2015 08:25   Dg Hand Complete Right  03/21/2015   CLINICAL DATA:  Pain following fall  EXAM: RIGHT HAND - COMPLETE 3+ VIEW  COMPARISON:  None.  FINDINGS: Frontal, oblique, and lateral views obtained. There is persistent flexion deformity of the second, third, fourth, and fifth PIP joints. There is no acute fracture or dislocation. There is no appreciable joint space narrowing or erosive change.  IMPRESSION: No acute fracture or dislocation. Persistent flexion deformity of multiple distal joints.   Electronically Signed   By: Lowella Grip III M.D.   On: 03/21/2015 08:27   I have personally reviewed and evaluated these images and lab results as part of my medical decision-making.   EKG Interpretation None        7:32 AM Discussed pt with Dr Tyrone Nine.   8:56 AM Discussed pt with Dr Terrence Dupont who recommends stopping Glipizide and not starting new medication with follow up with Dr Doylene Canard in the office tomorrow or the next day.    MDM   Final diagnoses:  Hypoglycemia  Heel pain, left  Fall, initial encounter  Right arm pain    Afebrile, nontoxic  patient with recurrent episodes of hypoglycemia.  Patient's appetite has gradually decreased as her MS has progressed (per family) and she eats very little during the day currently, has lost a lot of weight over long time period.  The glipizide is likely not the best medication for her at this point and I have asked her to stop taking it completely, follow up with Dr Doylene Canard in the office to discuss other options (Dr Terrence Dupont, on call for Dr Doylene Canard, is in agreement with this plan).  She has MS and is wheelchair and bed bound.  At discharge she complained about left heel pain that appears to be an early pressure wound.  I have asked nursing to redress and appropriately pad the area, provide with boots if available.  Pt advised to address this with wound care nurse who comes to her home tomorrow.  Labs remarkable for leukocytosis, which is improving since recent hospitalization for septic shock from UTI and proctitis.  Pt also has anemia, labs c/w malnourished state.  Pt given glucose, juice, breakfast by EMS and in the ED.  Last glipizide dose was 7pm yesterday.  She maintained her blood glucose for several hours and felt better. D/C home with close PCP follow up.  Discussed result, findings, treatment, and follow up  with patient.  Pt given return precautions.  Pt verbalizes understanding and agrees with plan.         Clayton Bibles, PA-C 03/21/15 [place], DO 03/23/15 [phone]

## 2015-03-21 NOTE — ED Provider Notes (Signed)
Medical screening examination/treatment/procedure(s) were conducted as a shared visit with non-physician practitioner(s) and myself.  I personally evaluated the patient during the encounter.   EKG Interpretation None       See the written copy of this report in the patient's paper medical record.  These results did not interface directly into the electronic medical record and are summarized here.  59 yo F with a chief complaint of hypoglycemia. Patient unable to tolerate metformin so is on glipizide. Has had recurrent episodes were her blood sugar drops into the 50s. Family has been watching this and giving her orange juice round-the-clock to try and help with this. Last time she took her medicine was last night. Hypoglycemic on arrival to the ED. Improved with oral solution. Patient is chronically ill-appearing on exam. Planning of left heel pain. Appears to be a stage I pressure ulcer. Erythema with blanching.  Observe in ED for 2 hours if stable blood sugar, stop glypizide, PCP follow up.   Deno Etienne, DO 03/23/15 [phone]

## 2015-05-10 ENCOUNTER — Inpatient Hospital Stay (HOSPITAL_COMMUNITY)
Admission: AD | Admit: 2015-05-10 | Discharge: 2015-05-13 | DRG: 592 | Disposition: A | Discharge status: Skilled Nursing Facility | Payer: Medicare Other | Source: Ambulatory Visit | Attending: Cardiovascular Disease | Admitting: Cardiovascular Disease

## 2015-05-10 DIAGNOSIS — I10 Essential (primary) hypertension: Secondary | ICD-10-CM | POA: Diagnosis present

## 2015-05-10 DIAGNOSIS — M549 Dorsalgia, unspecified: Secondary | ICD-10-CM | POA: Diagnosis present

## 2015-05-10 DIAGNOSIS — Z7984 Long term (current) use of oral hypoglycemic drugs: Secondary | ICD-10-CM

## 2015-05-10 DIAGNOSIS — L97919 Non-pressure chronic ulcer of unspecified part of right lower leg with unspecified severity: Secondary | ICD-10-CM | POA: Diagnosis present

## 2015-05-10 DIAGNOSIS — L89154 Pressure ulcer of sacral region, stage 4: Secondary | ICD-10-CM | POA: Diagnosis present

## 2015-05-10 DIAGNOSIS — Z87891 Personal history of nicotine dependence: Secondary | ICD-10-CM

## 2015-05-10 DIAGNOSIS — I890 Lymphedema, not elsewhere classified: Secondary | ICD-10-CM

## 2015-05-10 DIAGNOSIS — G35 Multiple sclerosis: Secondary | ICD-10-CM | POA: Diagnosis present

## 2015-05-10 DIAGNOSIS — D638 Anemia in other chronic diseases classified elsewhere: Secondary | ICD-10-CM | POA: Diagnosis present

## 2015-05-10 DIAGNOSIS — Z79899 Other long term (current) drug therapy: Secondary | ICD-10-CM | POA: Diagnosis not present

## 2015-05-10 DIAGNOSIS — Z7982 Long term (current) use of aspirin: Secondary | ICD-10-CM | POA: Diagnosis not present

## 2015-05-10 DIAGNOSIS — G4700 Insomnia, unspecified: Secondary | ICD-10-CM | POA: Diagnosis present

## 2015-05-10 DIAGNOSIS — L97929 Non-pressure chronic ulcer of unspecified part of left lower leg with unspecified severity: Secondary | ICD-10-CM | POA: Diagnosis present

## 2015-05-10 DIAGNOSIS — I83009 Varicose veins of unspecified lower extremity with ulcer of unspecified site: Secondary | ICD-10-CM | POA: Diagnosis present

## 2015-05-10 DIAGNOSIS — G8220 Paraplegia, unspecified: Secondary | ICD-10-CM | POA: Diagnosis present

## 2015-05-10 DIAGNOSIS — L89504 Pressure ulcer of unspecified ankle, stage 4: Secondary | ICD-10-CM | POA: Diagnosis present

## 2015-05-10 DIAGNOSIS — E1151 Type 2 diabetes mellitus with diabetic peripheral angiopathy without gangrene: Secondary | ICD-10-CM | POA: Diagnosis present

## 2015-05-10 DIAGNOSIS — L89159 Pressure ulcer of sacral region, unspecified stage: Secondary | ICD-10-CM | POA: Diagnosis present

## 2015-05-10 LAB — COMPREHENSIVE METABOLIC PANEL
ALT: 10 U/L — ABNORMAL LOW (ref 14–54)
ANION GAP: 13 (ref 5–15)
AST: 14 U/L — ABNORMAL LOW (ref 15–41)
Albumin: 2.2 g/dL — ABNORMAL LOW (ref 3.5–5.0)
Alkaline Phosphatase: 76 U/L (ref 38–126)
BILIRUBIN TOTAL: 0.2 mg/dL — AB (ref 0.3–1.2)
BUN: 17 mg/dL (ref 6–20)
CO2: 17 mmol/L — ABNORMAL LOW (ref 22–32)
Calcium: 8.7 mg/dL — ABNORMAL LOW (ref 8.9–10.3)
Chloride: 105 mmol/L (ref 101–111)
Creatinine, Ser: 0.46 mg/dL (ref 0.44–1.00)
Glucose, Bld: 133 mg/dL — ABNORMAL HIGH (ref 65–99)
POTASSIUM: 3.8 mmol/L (ref 3.5–5.1)
Sodium: 135 mmol/L (ref 135–145)
TOTAL PROTEIN: 7 g/dL (ref 6.5–8.1)

## 2015-05-10 LAB — CBC WITH DIFFERENTIAL/PLATELET
BASOS ABS: 0.1 10*3/uL (ref 0.0–0.1)
Basophils Relative: 0 %
Eosinophils Absolute: 0.1 10*3/uL (ref 0.0–0.7)
Eosinophils Relative: 1 %
HEMATOCRIT: 28.1 % — AB (ref 36.0–46.0)
Hemoglobin: 8.9 g/dL — ABNORMAL LOW (ref 12.0–15.0)
LYMPHS PCT: 6 %
Lymphs Abs: 1.3 10*3/uL (ref 0.7–4.0)
MCH: 25.6 pg — ABNORMAL LOW (ref 26.0–34.0)
MCHC: 31.7 g/dL (ref 30.0–36.0)
MCV: 81 fL (ref 78.0–100.0)
Monocytes Absolute: 1.3 10*3/uL — ABNORMAL HIGH (ref 0.1–1.0)
Monocytes Relative: 6 %
NEUTROS ABS: 19.6 10*3/uL — AB (ref 1.7–7.7)
Neutrophils Relative %: 87 %
PLATELETS: 565 10*3/uL — AB (ref 150–400)
RBC: 3.47 MIL/uL — AB (ref 3.87–5.11)
RDW: 16.6 % — ABNORMAL HIGH (ref 11.5–15.5)
WBC: 22.4 10*3/uL — AB (ref 4.0–10.5)

## 2015-05-10 LAB — MRSA PCR SCREENING: MRSA by PCR: NEGATIVE

## 2015-05-10 MED ORDER — CARVEDILOL 3.125 MG PO TABS
3.1250 mg | ORAL_TABLET | Freq: Two times a day (BID) | ORAL | Status: DC
  Administered 2015-05-10 – 2015-05-13 (×6): 3.125 mg via ORAL
  Filled 2015-05-10 (×6): qty 1

## 2015-05-10 MED ORDER — ASPIRIN EC 81 MG PO TBEC
81.0000 mg | DELAYED_RELEASE_TABLET | Freq: Every day | ORAL | Status: DC
  Administered 2015-05-10 – 2015-05-13 (×4): 81 mg via ORAL
  Filled 2015-05-10 (×4): qty 1

## 2015-05-10 MED ORDER — DEXTROSE 5 % IV SOLN
2.0000 g | INTRAVENOUS | Status: DC
  Administered 2015-05-10 – 2015-05-12 (×3): 2 g via INTRAVENOUS
  Filled 2015-05-10 (×6): qty 2

## 2015-05-10 MED ORDER — METHOCARBAMOL 500 MG PO TABS
250.0000 mg | ORAL_TABLET | Freq: Three times a day (TID) | ORAL | Status: DC | PRN
  Administered 2015-05-10 – 2015-05-13 (×6): 250 mg via ORAL
  Filled 2015-05-10 (×6): qty 1

## 2015-05-10 MED ORDER — GLIPIZIDE 5 MG PO TABS
2.5000 mg | ORAL_TABLET | Freq: Two times a day (BID) | ORAL | Status: DC
  Administered 2015-05-10 – 2015-05-13 (×6): 2.5 mg via ORAL
  Filled 2015-05-10 (×6): qty 1

## 2015-05-10 MED ORDER — SODIUM CHLORIDE 0.9 % IJ SOLN
3.0000 mL | Freq: Two times a day (BID) | INTRAMUSCULAR | Status: DC
  Administered 2015-05-11 – 2015-05-13 (×4): 3 mL via INTRAVENOUS

## 2015-05-10 MED ORDER — HYDROCODONE-ACETAMINOPHEN 5-325 MG PO TABS
2.0000 | ORAL_TABLET | ORAL | Status: DC | PRN
  Administered 2015-05-10 – 2015-05-13 (×12): 2 via ORAL
  Filled 2015-05-10 (×12): qty 2

## 2015-05-10 MED ORDER — ZOLPIDEM TARTRATE 5 MG PO TABS
5.0000 mg | ORAL_TABLET | Freq: Every evening | ORAL | Status: DC | PRN
  Administered 2015-05-10 – 2015-05-12 (×3): 5 mg via ORAL
  Filled 2015-05-10 (×3): qty 1

## 2015-05-10 MED ORDER — ADULT MULTIVITAMIN W/MINERALS CH
1.0000 | ORAL_TABLET | Freq: Every day | ORAL | Status: DC
  Administered 2015-05-10 – 2015-05-13 (×4): 1 via ORAL
  Filled 2015-05-10 (×4): qty 1

## 2015-05-10 MED ORDER — SODIUM CHLORIDE 0.9 % IV SOLN: 250.0000 mL | INTRAVENOUS | Status: DC | PRN

## 2015-05-10 MED ORDER — DOCUSATE SODIUM 100 MG PO CAPS
100.0000 mg | ORAL_CAPSULE | Freq: Two times a day (BID) | ORAL | Status: DC
  Administered 2015-05-10 – 2015-05-13 (×6): 100 mg via ORAL
  Filled 2015-05-10 (×6): qty 1

## 2015-05-10 MED ORDER — SODIUM CHLORIDE 0.9 % IJ SOLN: 3.0000 mL | INTRAMUSCULAR | Status: DC | PRN

## 2015-05-10 MED ORDER — BACLOFEN 10 MG PO TABS
5.0000 mg | ORAL_TABLET | Freq: Three times a day (TID) | ORAL | Status: DC
  Administered 2015-05-10 – 2015-05-13 (×8): 5 mg via ORAL
  Filled 2015-05-10 (×8): qty 1

## 2015-05-10 MED ORDER — HEPARIN SODIUM (PORCINE) 5000 UNIT/ML IJ SOLN
5000.0000 [IU] | Freq: Three times a day (TID) | INTRAMUSCULAR | Status: DC
  Administered 2015-05-10 – 2015-05-13 (×7): 5000 [IU] via SUBCUTANEOUS
  Filled 2015-05-10 (×7): qty 1

## 2015-05-10 MED ORDER — ACETAMINOPHEN 325 MG PO TABS: 650.0000 mg | ORAL_TABLET | Freq: Four times a day (QID) | ORAL | Status: DC | PRN

## 2015-05-10 NOTE — H&P (Signed)
Referring Physician:  [name] is an 59 y.o. female.                       Chief Complaint: Back pain and non-healing coccygeal ulcer  HPI: 59 y.o. female , single, lives with mother, wheelchair mobile, PMH of multiple sclerosis, DM 2, HTN, chronic lower extremity lymphedema, chronic anemia has chronic back pain and slowly worsening coccygeal ulcer, stage IV.  Past Medical History  Diagnosis Date  . MS (multiple sclerosis) ([place])   . HTN (hypertension)   . DM II (diabetes mellitus, type II), controlled ([place])   . Lymphedema   . Anemia   . Gait disorder       Past Surgical History  Procedure Laterality Date  . Flexible sigmoidoscopy Left 03/09/2015    Procedure: FLEXIBLE SIGMOIDOSCOPY;  Surgeon: Carol Ada, MD;  Location: [facility];  Service: Endoscopy;  Laterality: Left;    Family History  Problem Relation Age of Onset  . Multiple sclerosis Mother    Social History:  reports that she has quit smoking. Her smoking use included Cigarettes. She has never used smokeless tobacco. She reports that she does not drink alcohol or use illicit drugs.  Allergies:  Allergies  Allergen Reactions  . Sulfa Antibiotics Shortness Of Breath and Swelling    Medications Prior to Admission  Medication Sig Dispense Refill  . acetaminophen (TYLENOL) 325 MG tablet Take 2 tablets (650 mg total) by mouth every 6 (six) hours as needed for mild pain (or Fever >/= 101). 45 tablet 0  . aspirin EC 81 MG EC tablet Take 1 tablet (81 mg total) by mouth daily.    . baclofen (LIORESAL) 10 MG tablet Take 0.5 tablets (5 mg total) by mouth 3 (three) times daily. 45 tablet 11  . carvedilol (COREG) 3.125 MG tablet Take 3.125 mg by mouth 2 (two) times daily.  6  . diphenhydramine-acetaminophen (TYLENOL PM) 25-500 MG TABS tablet Take 1 tablet by mouth at bedtime as needed (for sleep).    Marland Kitchen glipiZIDE (GLUCOTROL) 5 MG tablet Take 0.5 tablets by mouth 2 (two) times daily.  3  . HYDROcodone-acetaminophen  (NORCO/VICODIN) 5-325 MG per tablet Take 2 tablets by mouth every 4 (four) hours as needed for moderate pain. 30 tablet 0  . methocarbamol (ROBAXIN) 500 MG tablet Take 0.5 tablets (250 mg total) by mouth every 8 (eight) hours as needed for muscle spasms. 30 tablet 0  . zolpidem (AMBIEN) 5 MG tablet Take 1 tablet (5 mg total) by mouth at bedtime as needed for sleep (insomnia). 30 tablet 0  . [DISCONTINUED] Amino Acids-Protein Hydrolys (FEEDING SUPPLEMENT, PRO-STAT SUGAR FREE 64,) LIQD Take 30 mLs by mouth 2 (two) times daily. (Patient not taking: Reported on 03/21/2015) 900 mL 0  . [DISCONTINUED] midodrine (PROAMATINE) 2.5 MG tablet Take 1 tablet (2.5 mg total) by mouth 2 (two) times daily with a meal. (Patient not taking: Reported on 03/21/2015) 60 tablet 0  . [DISCONTINUED] pantoprazole (PROTONIX) 40 MG tablet Take 1 tablet (40 mg total) by mouth daily. (Patient not taking: Reported on 03/21/2015) 30 tablet 0    No results found for this or any previous visit (from the past 48 hour(s)). No results found.  Review Of Systems Constitutional: Positive for fever and malaise/fatigue. Negative for chills, weight loss and diaphoresis.  HENT: Negative.  Eyes: Negative.  Cardiovascular: Positive for leg swelling.  Gastrointestinal: Positive for diarrhea. Negative for heartburn, nausea, vomiting and abdominal pain.  Musculoskeletal: Positive  for back pain and joint pain.  Skin: Negative.  Neurological: Positive for weakness.  Psychiatric/Behavioral: Negative for depression, suicidal ideas, hallucinations, memory loss and substance abuse. The patient is not nervous/anxious and does not have insomnia.  Blood pressure 109/40, pulse 118, temperature 97.7 F (36.5 C), temperature source Oral, resp. rate 16, height 5\' 3"  (1.6 m), weight 66.8 kg (147 lb 4.3 oz), SpO2 96 %. General: She is resting quietly in bed watching television Skin: No rash. She has a large deep sacral pressure ulcer with visible bone  measuring 7 cm x 4 cm x 0.2 cm to 0.6 cm with evidence of infection. Lymph nodes: No palpable adenopathy Eyes: Brown, Conj-pale pink, Sclera-white. Oral: No oropharyngeal lesions Lungs: Clear, bilaterally. Heart: Distant but regular S1 and S2 with no murmurs Abdomen: Obese, soft and nontender without sounds. No palpable masses. Joints and extremities: Diffuse anasarca. She has dry thickened skin of her lower legs compatible with chronic stasis dermatitis. There is no overlying cellulitis. There is no obvious fluctuance. There is no area of particular tenderness with palpation. Neurologic: Alert with normal speech and conversation. Quadriplegic.   Assessment/Plan Stage IV coccygeal pressure ulcer. Multiple sclerosis Diabetes mellitus with peripheral vascular disease H/O Leukocytosis Anemia of chronic disease Chronic lymphedema Hypertension Sinus tachycardia  Admit/IV antibiotic.  [name], MD  05/10/2015, 7:05 PM

## 2015-05-11 ENCOUNTER — Encounter (HOSPITAL_COMMUNITY): Payer: Self-pay

## 2015-05-11 MED ORDER — VITAMIN C 500 MG PO TABS
500.0000 mg | ORAL_TABLET | Freq: Every day | ORAL | Status: DC
  Administered 2015-05-11 – 2015-05-13 (×3): 500 mg via ORAL
  Filled 2015-05-11 (×3): qty 1

## 2015-05-11 MED ORDER — COLLAGENASE 250 UNIT/GM EX OINT
TOPICAL_OINTMENT | Freq: Every day | CUTANEOUS | Status: DC
  Administered 2015-05-11 – 2015-05-13 (×3): via TOPICAL
  Filled 2015-05-11 (×2): qty 30

## 2015-05-11 MED ORDER — FERROUS SULFATE 325 (65 FE) MG PO TABS
325.0000 mg | ORAL_TABLET | Freq: Every day | ORAL | Status: DC
  Administered 2015-05-11 – 2015-05-13 (×3): 325 mg via ORAL
  Filled 2015-05-11 (×3): qty 1

## 2015-05-11 NOTE — Care Management Note (Addendum)
Case Management Note  Patient Details  Name: Jocelyn Sanchez MRN: [id number] Date of Birth: 05/16/1956  Subjective/Objective:      Date: 05/11/15 Spoke with patient at the bedside.  Introduced self as Tourist information centre manager and explained role in discharge planning and how to be reached.  Verified patient lives in town, alone with mom. Has DME hospital bed and a motorized wheelchair at home.  Expressed potential need for no other DME.  Verified patient anticipates to go SNF at time of discharge. Patient denied needing help with their medication.  Patient uses scat to go  to MD appointments.  Verified patient has PCP Dixie Dials.  NCM received call from [facility] with [facility] stating that patient is active with them at this time and that the social worker is facilitating getting patient into a snf , she has bee noncompliant with wound care, they would not be able to take patient back.  Patient states she has caps from 8:30- 2:30  And then she comes back at 5:30- 9 pm.   NCM informed CSW of this information. Patient is agreeable to go to snf probably for short term.  Patient is concerned that she may loose her cap hours if go to snf,  NCM informed her not sure will check with CSW.   Plan: CM will continue to follow for discharge planning and [facility] resources.               Action/Plan:   Expected Discharge Date:                  Expected Discharge Plan:  Skilled Nursing Facility  In-House Referral:  Clinical Social Work  Discharge planning Services  CM Consult  Post Acute Care Choice:    Choice offered to:     DME Arranged:    DME Agency:     HH Arranged:    [place] Agency:     Status of Service:  In process, will continue to follow  Medicare Important Message Given:    Date Medicare IM Given:    Medicare IM give by:    Date Additional Medicare IM Given:    Additional Medicare Important Message give by:     If discussed at [place] of Stay Meetings, dates discussed:    Additional  Comments:  Zenon Mayo, RN 05/11/2015, 2:26 PM

## 2015-05-11 NOTE — NC FL2 (Signed)
[place] LEVEL OF CARE SCREENING TOOL     IDENTIFICATION  [name] Birthdate: [birth year] Sex: female Admission Date (Current Location): 05/10/2015  [facility] and [place] Number: Herbalist and Address:  [facility], [place] [address] [place], [place] [address]      Provider Number: [id number]  Attending Physician Name and Address:  Dixie Dials, MD  Relative Name and Phone Number:  Roselyn    Current Level of Care: Hospital Recommended Level of Care: [place] Prior Approval Number:    Date Approved/Denied:   PASRR Number: [id number]  Discharge Plan: SNF    Current Diagnoses: Patient Active Problem List   Diagnosis Date Noted  . Decubitus ulcer of ankle, stage 4 ([place]) 05/10/2015  . Decubitus ulcer of coccygeal region 05/10/2015  . FUO (fever of unknown origin)   . Back pain   . Proctitis   . Citrobacter infection 03/08/2015  . Fever 03/08/2015  . Sepsis due to Escherichia coli with acute renal failure ([place]) 03/06/2015  . Septic shock ([place]) 03/06/2015  . E. coli UTI (urinary tract infection) 03/06/2015  . Diabetes mellitus with peripheral vascular disease ([place]) 03/06/2015  . Leukocytosis 03/06/2015  . Hypophosphatemia 03/06/2015  . Hypomagnesemia 03/06/2015  . Anemia of chronic disease 03/06/2015  . Decubitus ulcer, stage 2 03/06/2015  . Lymphedema 03/06/2015  . Hypokalemia 05/11/2011  . Multiple sclerosis ([place]) 05/11/2011    Orientation ACTIVITIES/SOCIAL BLADDER RESPIRATION    Self, Time, Situation, Place  Family supportive Incontinent Normal  BEHAVIORAL SYMPTOMS/MOOD NEUROLOGICAL BOWEL NUTRITION STATUS      Incontinent  (See DC Summary)  PHYSICIAN VISITS COMMUNICATION OF NEEDS Height & Weight Skin    Verbally 5\' 3"  (160 cm) 154 lbs. Other (Comment) (Stage 4 Pressure Ulcer)          AMBULATORY STATUS RESPIRATION    Assist extensive Normal      Personal Care  Assistance Level of Assistance  Bathing, Feeding, Dressing Bathing Assistance: Limited assistance Feeding assistance: Limited assistance Dressing Assistance: Limited assistance      Functional Limitations Info                [place]  PT (By licensed PT)     PT Frequency: 5x/week             Additional Factors Info  Code Status, Allergies Code Status Info: Full Allergies Info: Sulfa Antibiotics           Current Medications (05/11/2015): Current Facility-Administered Medications  Medication Dose Route Frequency Provider Last Rate Last Dose  . 0.9 %  sodium chloride infusion  250 mL Intravenous PRN Dixie Dials, MD      . acetaminophen (TYLENOL) tablet 650 mg  650 mg Oral Q6H PRN Dixie Dials, MD      . aspirin EC tablet 81 mg  81 mg Oral Daily Dixie Dials, MD   81 mg at 05/11/15 1004  . baclofen (LIORESAL) tablet 5 mg  5 mg Oral TID Dixie Dials, MD   5 mg at 05/11/15 1005  . carvedilol (COREG) tablet 3.125 mg  3.125 mg Oral BID WC Dixie Dials, MD   3.125 mg at 05/11/15 0844  . cefTRIAXone (ROCEPHIN) 2 g in dextrose 5 % 50 mL IVPB  2 g Intravenous Q24H Dixie Dials, MD   2 g at 05/10/15 2250  . collagenase (SANTYL) ointment   Topical Daily Dixie Dials, MD      .  docusate sodium (COLACE) capsule 100 mg  100 mg Oral BID Dixie Dials, MD   100 mg at 05/11/15 1005  . ferrous sulfate tablet 325 mg  325 mg Oral Q breakfast Dixie Dials, MD   325 mg at 05/11/15 0844  . glipiZIDE (GLUCOTROL) tablet 2.5 mg  2.5 mg Oral BID Dixie Dials, MD   2.5 mg at 05/11/15 1004  . heparin injection 5,000 Units  5,000 Units Subcutaneous 3 times per day Dixie Dials, MD   5,000 Units at 05/11/15 1407  . HYDROcodone-acetaminophen (NORCO/VICODIN) 5-325 MG per tablet 2 tablet  2 tablet Oral Q4H PRN Dixie Dials, MD   2 tablet at 05/11/15 1407  . methocarbamol (ROBAXIN) tablet 250 mg  250 mg Oral Q8H PRN Dixie Dials, MD   250 mg at 05/11/15 1407  . multivitamin with  minerals tablet 1 tablet  1 tablet Oral Daily Dixie Dials, MD   1 tablet at 05/11/15 1005  . sodium chloride 0.9 % injection 3 mL  3 mL Intravenous Q12H Dixie Dials, MD   3 mL at 05/11/15 1013  . sodium chloride 0.9 % injection 3 mL  3 mL Intravenous PRN Dixie Dials, MD      . vitamin C (ASCORBIC ACID) tablet 500 mg  500 mg Oral Daily Dixie Dials, MD   500 mg at 05/11/15 0844  . zolpidem (AMBIEN) tablet 5 mg  5 mg Oral QHS PRN Dixie Dials, MD   5 mg at 05/10/15 2201   Do not use this list as official medication orders. Please verify with discharge summary.  Discharge Medications:   Medication List    ASK your doctor about these medications        acetaminophen 325 MG tablet  Commonly known as:  TYLENOL  Take 2 tablets (650 mg total) by mouth every 6 (six) hours as needed for mild pain (or Fever >/= 101).     aspirin 81 MG EC tablet  Take 1 tablet (81 mg total) by mouth daily.     baclofen 10 MG tablet  Commonly known as:  LIORESAL  Take 0.5 tablets (5 mg total) by mouth 3 (three) times daily.     carvedilol 3.125 MG tablet  Commonly known as:  COREG  Take 3.125 mg by mouth 2 (two) times daily.     diphenhydramine-acetaminophen 25-500 MG Tabs tablet  Commonly known as:  TYLENOL PM  Take 1 tablet by mouth at bedtime as needed (for sleep).     glipiZIDE 5 MG tablet  Commonly known as:  GLUCOTROL  Take 0.5 tablets by mouth 2 (two) times daily.     HYDROcodone-acetaminophen 5-325 MG tablet  Commonly known as:  NORCO/VICODIN  Take 2 tablets by mouth every 4 (four) hours as needed for moderate pain.     methocarbamol 500 MG tablet  Commonly known as:  ROBAXIN  Take 0.5 tablets (250 mg total) by mouth every 8 (eight) hours as needed for muscle spasms.     zolpidem 5 MG tablet  Commonly known as:  AMBIEN  Take 1 tablet (5 mg total) by mouth at bedtime as needed for sleep (insomnia).        Relevant Imaging Results:  Relevant Lab Results:  Recent Labs     Additional Information Positive MRSA on 03/02/15  Benard Halsted, LCSW

## 2015-05-11 NOTE — Clinical Social Work Note (Signed)
Clinical Social Work Assessment  Patient Details  Name: Jocelyn Sanchez MRN: [id number] Date of Birth: 01/10/1956  Date of referral:  05/11/15               Reason for consult:  Facility Placement                Permission sought to share information with:  Facility Contact Representative Permission granted to share information::  Yes, Verbal Permission Granted (Currently not providing family contact.)  Name::        Agency::  [facility]  Relationship::     Contact Information:     Housing/Transportation Living arrangements for the past 2 months:  Single Family Home Source of Information:  Patient Patient Interpreter Needed:  None Criminal Activity/Legal Involvement Pertinent to Current Situation/Hospitalization:  No - Comment as needed Significant Relationships:  Parents, Adult Children (Lives w/ mother) Lives with:  Parents (Lives w/ mother.) Do you feel safe going back to the place where you live?  No Need for family participation in patient care:  Yes (Comment)  Care giving concerns:  CSW received referral for possible SNF placement at time of discharge. CSW met with patient regarding PT recommendation of SNF placement at time of discharge. Per patient, patient's mother is currently unable to care for patient at their home given patient's current physical needs and wound care. Patient expressed understanding of PT recommendation and is agreeable to SNF placement at time of discharge. CSW to continue to follow and assist with discharge planning needs.   Social Worker assessment / plan:  CSW spoke with patient concerning possibility of rehab at SNF before returning home. CSW made sure patient was aware of need for 3 night qualifying stay for Medicare to cover SNF. Patient expressed understanding.   Employment status:  Disabled (Comment on whether or not currently receiving Disability) Insurance information:  Medicare PT Recommendations:  Skilled Nursing  Facility Information / Referral to community resources:  Skilled Nursing Facility  Patient/Family's Response to care:  Patient recognizes need for rehab before returning home and is agreeable to a SNF in [place]. Patient reported preference for a facility near her home.  Patient/Family's Understanding of and Emotional Response to Diagnosis, Current Treatment, and Prognosis:  Patient is realistic regarding therapy needs. No questions/concerns about plan or treatment.    Emotional Assessment Appearance:  Appears stated age Attitude/Demeanor/Rapport:    Affect (typically observed):  Accepting, Appropriate Orientation:  Oriented to Self, Oriented to Place, Oriented to  Time, Oriented to Situation Alcohol / Substance use:  Not Applicable Psych involvement (Current and /or in the community):  No (Comment)  Discharge Needs  Concerns to be addressed:  Care Coordination Readmission within the last 30 days:  No Current discharge risk:  None Barriers to Discharge:  Continued Medical Work up    S , LCSW 05/11/2015, 3:58 PM  

## 2015-05-11 NOTE — Progress Notes (Signed)
Utilization review completed. Schneider Warchol, RN, BSN. 

## 2015-05-11 NOTE — Consult Note (Addendum)
WOC wound consult note Reason for Consult: Consult requested for sacrum and right ischium.  Pt has MS and limited mobility as well as other systemic factors which can impair healing.  She is frequently incontinent of urine and it is difficult to keep dressings from becoming soiled.   Wound type: Sacrum with stage 4 pressure injury; 5.5X6.5X2 cm, with tunneling at 12:00 o'clock to 6.5cm.  Wound is 85% red, 15% yellow, bone palpable with swab, large amt yellow drainage, no odor.  Next to this, there is a stage 3 wound, 2X1X.2cm, 100% red and moist. Right ischium with unstageable pressure injury; 2.5X3.5cm, 100% yellow slough tightly adhered, no odor, small amt yellow drainage. Pressure Ulcer POA: Yes Left leg with full thickness stasis ulcer; 1X1X.1cm, 100% yellow, mod amt yellow drainage, small amt yellow drainage. Right posterior leg with patchy areas of full thickness stasis ulcers, affected area approx 8X9cm, all less than .1cm depth, red wound beds, small amt yellow drainage, no odor. Bilat legs with generalized edema bilat. Dressing procedure/placement/frequency: Air mattress to reduce pressure to sacrum and ischium.  Aquacel to absorb drainage and provide antimicrobial benefits to sacrum wound.  Santyl for chemical debridement of nonviable tissue to right ischium.  Foam dressing to BLE stasis ulcers to absorb drainage and promote healing.  Continue present plan of care with ace wrap for light compression to bilat legs. Optimize nutrition, pt could benefit from a foley to keep wound dry and promote healing; please order if desired.  She can resume followup with home health after discharge for dressing change assistance.  Discussed plan of care and she verbalized understanding. Please re-consult if further assistance is needed.  Thank-you,  Julien Girt MSN, [place], [place], [place], [place]

## 2015-05-12 MED ORDER — GLUCERNA SHAKE PO LIQD
237.0000 mL | Freq: Two times a day (BID) | ORAL | Status: DC
  Administered 2015-05-13: 237 mL via ORAL

## 2015-05-12 MED ORDER — VANCOMYCIN HCL IN DEXTROSE 750-5 MG/150ML-% IV SOLN
750.0000 mg | Freq: Two times a day (BID) | INTRAVENOUS | Status: DC
  Administered 2015-05-12 – 2015-05-13 (×2): 750 mg via INTRAVENOUS
  Filled 2015-05-12 (×3): qty 150

## 2015-05-12 NOTE — Progress Notes (Signed)
Initial Nutrition Assessment  DOCUMENTATION CODES:   Not applicable  INTERVENTION:   -Glucerna Shake po BID, each supplement provides 220 kcal and 10 grams of protein -Continue MVI daily  NUTRITION DIAGNOSIS:   Increased nutrient needs related to wound healing as evidenced by estimated needs.  GOAL:   Patient will meet greater than or equal to 90% of their needs  MONITOR:   PO intake, Supplement acceptance, Labs, Weight trends, Skin, I & O's  REASON FOR ASSESSMENT:   Consult Wound healing  ASSESSMENT:   59 y.o. female , single, lives with mother, wheelchair mobile, PMH of multiple sclerosis, DM 2, HTN, chronic lower extremity lymphedema, chronic anemia has chronic back pain and slowly worsening coccygeal ulcer, stage IV.  Pt admitted with back pain and non-healing coccygeal ulcer. Per chart review, pt has been non-compliant with wound care at home.  Spoke with pt at bedside. She reports good appetite presently, however, appetite was variable PTA ("sometimes they would cook me things I didn't like"). Pt reveals that she has difficulty chewing tougher meats, but has not had trouble consuming food here. She politely declined offer for diet downgrade.   Pt denies weight loss, however, noted 1 21# wt loss over the past month. Per wt hx, wt appears to fluctuate at baseline (UBW between 163-179#). Unsure if most recent weight reading reflects most accurate weight.   Reviewed COWRN note from 05/11/15; pt with full thickness lt leg venous stasis ulcer, stage III pressure injury to sacrum, stage IV pressure injury to sacrum, and unstageable wound to rt ischium. Pt is on an air mattress to promote wound healing.   Discussed importance of good meal and supplement intake to promote wound healing. Spent time education on importance of protein intake, glycemic control, and compliance with medications/therapies to promote healing.   Nutrition-Focused physical exam completed. Findings are no  fat depletion, no muscle depletion, and no edema.   CSW following. Plan for SNF placement once medically stable.  Labs reviewed.  Diet Order:  Diet heart healthy/carb modified Room service appropriate?: Yes; Fluid consistency:: Thin  Skin:  Wound (see comment) (full thickness lt leg stasis, stIV+III sacrum, UN rt ischium)  Last BM:  05/11/15  Height:   Ht Readings from Last 1 Encounters:  05/10/15 5\' 3"  (1.6 m)    Weight:   Wt Readings from Last 1 Encounters:  05/12/15 154 lb 14.4 oz (70.262 kg)    Ideal Body Weight:  52.3 kg  BMI:  Body mass index is 27.45 kg/(m^2).  Estimated Nutritional Needs:   Kcal:  1850-2050  Protein:  95-110 grams  Fluid:  >1.8 L  EDUCATION NEEDS:   Education needs addressed  Jocelyn Sanchez A. Jimmye Norman, RD, LDN, CDE Pager: [phone] After hours Pager: [phone]

## 2015-05-12 NOTE — Progress Notes (Signed)
ANTIBIOTIC CONSULT NOTE - INITIAL  Pharmacy Consult for Vancomycin Indication: wound infection  Allergies  Allergen Reactions  . Sulfa Antibiotics Shortness Of Breath and Swelling    Patient Measurements: Height: 5\' 3"  (160 cm) Weight: 154 lb 14.4 oz (70.262 kg) IBW/kg (Calculated) : 52.4  Vital Signs: Temp: 99.2 F (37.3 C) (11/23 1324) Temp Source: Oral (11/23 1324) BP: 115/67 mmHg (11/23 1708) Pulse Rate: 93 (11/23 1708) Intake/Output from previous day: 11/22 0701 - 11/23 0700 In: 530 [P.O.:480; IV Piggyback:50] Out: -   Labs:  Recent Labs  05/10/15 2020  WBC 22.4*  HGB 8.9*  PLT 565*  CREATININE 0.46   Estimated Creatinine Clearance: 70.4 mL/min (by C-G formula based on Cr of 0.46).  Microbiology: Recent Results (from the past 720 hour(s))  MRSA PCR Screening     Status: None   Collection Time: 05/10/15  8:29 PM  Result Value Ref Range Status   MRSA by PCR NEGATIVE NEGATIVE Final    Comment:        The GeneXpert MRSA Assay (FDA approved for NASAL specimens only), is one component of a comprehensive MRSA colonization surveillance program. It is not intended to diagnose MRSA infection nor to guide or monitor treatment for MRSA infections.     Medical History: Past Medical History  Diagnosis Date  . MS (multiple sclerosis) ([place])   . HTN (hypertension)   . DM II (diabetes mellitus, type II), controlled ([place])   . Lymphedema   . Anemia   . Gait disorder    Assessment:   59 yr old female admitted 11/21 with non-healing sacrococcygeal ulcer.     Day #3 Ceftriaxone, to add Vanc tonight.  Tmax 99.2.  WBC 22.5 on 11/21.   Has MS, wheelchair-bound.  Vanc clearance may be slower than standard predictions, so will dose less-aggressively to begin.  Goal of Therapy:  Vancomycin trough level 15-20 mcg/ml  Plan:   Vancomycin 750 mg IV q12hrs.  Will follow renal function, progress.  Will check Vanc trough level at steady state.  CBC and bmet in  am.  Arty Baumgartner, [place] Pager: [phone] 05/12/2015,8:02 PM

## 2015-05-12 NOTE — Progress Notes (Signed)
Late entry Ref: [name], MD   Subjective:  Awaiting wound care consult.  Objective:  Vital Signs in the last 24 hours: Temp:  [98.7 F (37.1 C)-99.2 F (37.3 C)] 99.2 F (37.3 C) (11/23 1324) Pulse Rate:  [93-101] 93 (11/23 1708) Cardiac Rhythm:  [-]  Resp:  [18-22] 22 (11/23 1324) BP: (104-127)/(50-67) 115/67 mmHg (11/23 1708) SpO2:  [94 %-100 %] 100 % (11/23 1324) Weight:  [70.262 kg (154 lb 14.4 oz)] 70.262 kg (154 lb 14.4 oz) (11/23 0500)  Physical Exam: BP Readings from Last 1 Encounters:  05/12/15 115/67    Wt Readings from Last 1 Encounters:  05/12/15 70.262 kg (154 lb 14.4 oz)    Weight change: 3.462 kg (7 lb 10.1 oz)  HEENT: [place]/AT, Eyes-Brown, PERL, EOMI, Conjunctiva-Pale, Sclera-Non-icteric Neck: No JVD, No bruit, Trachea midline. Lungs:  Clear, Bilateral. Cardiac:  Regular rhythm, normal S1 and S2, no S3.  Abdomen:  Soft, non-tender. Extremities:  Diffuse edema with thickened skin of both lower leg as before. Also sacral decubitus stage IV present. No cyanosis. No clubbing. CNS: AxOx3, Cranial nerves grossly intact, moves all 4 extremities. Right handed. Skin: Warm and dry.   Intake/Output from previous day: 11/22 0701 - 11/23 0700 In: 530 [P.O.:480; IV Piggyback:50] Out: -     Lab Results: BMET    Component Value Date/Time   NA 135 05/10/2015 2020   NA 134* 03/21/2015 0548   NA 136 03/05/2015 0030   K 3.8 05/10/2015 2020   K 4.8 03/21/2015 0548   K 4.0 03/05/2015 0030   CL 105 05/10/2015 2020   CL 103 03/21/2015 0548   CL 105 03/05/2015 0030   CO2 17* 05/10/2015 2020   CO2 24 03/21/2015 0548   CO2 22 03/05/2015 0030   GLUCOSE 133* 05/10/2015 2020   GLUCOSE 75 03/21/2015 0548   GLUCOSE 123* 03/05/2015 0030   BUN 17 05/10/2015 2020   BUN 10 03/21/2015 0548   BUN 8 03/05/2015 0030   CREATININE 0.46 05/10/2015 2020   CREATININE 0.37* 03/21/2015 0548   CREATININE 0.39* 03/05/2015 0030   CALCIUM 8.7* 05/10/2015 2020   CALCIUM 8.3*  03/21/2015 0548   CALCIUM 7.5* 03/05/2015 0030   GFRNONAA >60 05/10/2015 2020   GFRNONAA >60 03/21/2015 0548   GFRNONAA >60 03/05/2015 0030   GFRAA >60 05/10/2015 2020   GFRAA >60 03/21/2015 0548   GFRAA >60 03/05/2015 0030   CBC    Component Value Date/Time   WBC 22.4* 05/10/2015 2020   RBC 3.47* 05/10/2015 2020   RBC 3.67* 03/01/2015 2130   HGB 8.9* 05/10/2015 2020   HCT 28.1* 05/10/2015 2020   PLT 565* 05/10/2015 2020   MCV 81.0 05/10/2015 2020   MCH 25.6* 05/10/2015 2020   MCHC 31.7 05/10/2015 2020   RDW 16.6* 05/10/2015 2020   LYMPHSABS 1.3 05/10/2015 2020   MONOABS 1.3* 05/10/2015 2020   EOSABS 0.1 05/10/2015 2020   BASOSABS 0.1 05/10/2015 2020   HEPATIC Function Panel  Recent Labs  03/03/15 0525 03/21/15 0548 05/10/15 2020  PROT 5.3* 7.1 7.0   HEMOGLOBIN A1C No components found for: HGA1C,  MPG CARDIAC ENZYMES No results found for: CKTOTAL, CKMB, CKMBINDEX, TROPONINI BNP No results for input(s): PROBNP in the last 8760 hours. TSH  Recent Labs  11/28/14 1115  TSH 1.886   CHOLESTEROL No results for input(s): CHOL in the last 8760 hours.  Scheduled Meds: . aspirin EC  81 mg Oral Daily  . baclofen  5 mg Oral TID  .  carvedilol  3.125 mg Oral BID WC  . cefTRIAXone (ROCEPHIN)  IV  2 g Intravenous Q24H  . collagenase   Topical Daily  . docusate sodium  100 mg Oral BID  . [START ON 05/13/2015] feeding supplement (GLUCERNA SHAKE)  237 mL Oral BID BM  . ferrous sulfate  325 mg Oral Q breakfast  . glipiZIDE  2.5 mg Oral BID  . heparin  5,000 Units Subcutaneous 3 times per day  . multivitamin with minerals  1 tablet Oral Daily  . sodium chloride  3 mL Intravenous Q12H  . vitamin C  500 mg Oral Daily   Continuous Infusions:  PRN Meds:.sodium chloride, acetaminophen, HYDROcodone-acetaminophen, methocarbamol, sodium chloride, zolpidem  Assessment/Plan: Stage IV coccygeal pressure ulcer. Multiple sclerosis Diabetes mellitus with peripheral vascular  disease H/O Leukocytosis Anemia of chronic disease Chronic lymphedema Hypertension Sinus tachycardia  Continue IV antibiotic    LOS: 2 days    Dixie Dials  MD  05/12/2015, 7:33 PM

## 2015-05-12 NOTE — Progress Notes (Signed)
Ref: [name], MD   Subjective:  Willing to go to SNF. Sacrococcygeal stage 4 ulcer with tunneling is treated with packing. Elevated WBC count. Mild fever.  Objective:  Vital Signs in the last 24 hours: Temp:  [98.7 F (37.1 C)-99.2 F (37.3 C)] 99.2 F (37.3 C) (11/23 1324) Pulse Rate:  [93-101] 93 (11/23 1708) Cardiac Rhythm:  [-]  Resp:  [18-22] 22 (11/23 1324) BP: (104-127)/(50-67) 115/67 mmHg (11/23 1708) SpO2:  [94 %-100 %] 100 % (11/23 1324) Weight:  [70.262 kg (154 lb 14.4 oz)] 70.262 kg (154 lb 14.4 oz) (11/23 0500)  Physical Exam: BP Readings from Last 1 Encounters:  05/12/15 115/67    Wt Readings from Last 1 Encounters:  05/12/15 70.262 kg (154 lb 14.4 oz)    Weight change: 3.462 kg (7 lb 10.1 oz)  HEENT: [place]/AT, Eyes-Brown, PERL, EOMI, Conjunctiva-Pale, Sclera-Non-icteric Neck: No JVD, No bruit, Trachea midline. Lungs:  Clear, Bilateral. Cardiac:  Regular rhythm, normal S1 and S2, no S3.  Abdomen:  Soft, non-tender. Extremities:  Wounds per wound care consult: Sacrum with stage 4 pressure injury; 5.5X6.5X2 cm, with tunneling at 12:00 o'clock to 6.5cm. Wound is 85% red, 15% yellow, bone palpable with swab, large amt yellow drainage, no odor. Next to this, there is a stage 3 wound, 2X1X.2cm, 100% red and moist. Right ischium with unstageable pressure injury; 2.5X3.5cm, 100% yellow slough tightly adhered, no odor, small amt yellow drainage. Pressure Ulcer POA: Yes Left leg with full thickness stasis ulcer; 1X1X.1cm, 100% yellow, mod amt yellow drainage, small amt yellow drainage. Right posterior leg with patchy areas of full thickness stasis ulcers, affected area approx 8X9cm, all less than .1cm depth, red wound beds, small amt yellow drainage, no odor. Bilat legs with generalized edema bilat. CNS: AxOx3, Cranial nerves grossly intact, Paraplegic and small movements of both upper extremities. Skin: Warm and dry.   Intake/Output from previous day: 11/22 0701 -  11/23 0700 In: 530 [P.O.:480; IV Piggyback:50] Out: -     Lab Results: BMET    Component Value Date/Time   NA 135 05/10/2015 2020   NA 134* 03/21/2015 0548   NA 136 03/05/2015 0030   K 3.8 05/10/2015 2020   K 4.8 03/21/2015 0548   K 4.0 03/05/2015 0030   CL 105 05/10/2015 2020   CL 103 03/21/2015 0548   CL 105 03/05/2015 0030   CO2 17* 05/10/2015 2020   CO2 24 03/21/2015 0548   CO2 22 03/05/2015 0030   GLUCOSE 133* 05/10/2015 2020   GLUCOSE 75 03/21/2015 0548   GLUCOSE 123* 03/05/2015 0030   BUN 17 05/10/2015 2020   BUN 10 03/21/2015 0548   BUN 8 03/05/2015 0030   CREATININE 0.46 05/10/2015 2020   CREATININE 0.37* 03/21/2015 0548   CREATININE 0.39* 03/05/2015 0030   CALCIUM 8.7* 05/10/2015 2020   CALCIUM 8.3* 03/21/2015 0548   CALCIUM 7.5* 03/05/2015 0030   GFRNONAA >60 05/10/2015 2020   GFRNONAA >60 03/21/2015 0548   GFRNONAA >60 03/05/2015 0030   GFRAA >60 05/10/2015 2020   GFRAA >60 03/21/2015 0548   GFRAA >60 03/05/2015 0030   CBC    Component Value Date/Time   WBC 22.4* 05/10/2015 2020   RBC 3.47* 05/10/2015 2020   RBC 3.67* 03/01/2015 2130   HGB 8.9* 05/10/2015 2020   HCT 28.1* 05/10/2015 2020   PLT 565* 05/10/2015 2020   MCV 81.0 05/10/2015 2020   MCH 25.6* 05/10/2015 2020   MCHC 31.7 05/10/2015 2020   RDW 16.6*  05/10/2015 2020   LYMPHSABS 1.3 05/10/2015 2020   MONOABS 1.3* 05/10/2015 2020   EOSABS 0.1 05/10/2015 2020   BASOSABS 0.1 05/10/2015 2020   HEPATIC Function Panel  Recent Labs  03/03/15 0525 03/21/15 0548 05/10/15 2020  PROT 5.3* 7.1 7.0   HEMOGLOBIN A1C No components found for: HGA1C,  MPG CARDIAC ENZYMES No results found for: CKTOTAL, CKMB, CKMBINDEX, TROPONINI BNP No results for input(s): PROBNP in the last 8760 hours. TSH  Recent Labs  11/28/14 1115  TSH 1.886   CHOLESTEROL No results for input(s): CHOL in the last 8760 hours.  Scheduled Meds: . aspirin EC  81 mg Oral Daily  . baclofen  5 mg Oral TID  .  carvedilol  3.125 mg Oral BID WC  . cefTRIAXone (ROCEPHIN)  IV  2 g Intravenous Q24H  . collagenase   Topical Daily  . docusate sodium  100 mg Oral BID  . [START ON 05/13/2015] feeding supplement (GLUCERNA SHAKE)  237 mL Oral BID BM  . ferrous sulfate  325 mg Oral Q breakfast  . glipiZIDE  2.5 mg Oral BID  . heparin  5,000 Units Subcutaneous 3 times per day  . multivitamin with minerals  1 tablet Oral Daily  . sodium chloride  3 mL Intravenous Q12H  . vitamin C  500 mg Oral Daily   Continuous Infusions:  PRN Meds:.sodium chloride, acetaminophen, HYDROcodone-acetaminophen, methocarbamol, sodium chloride, zolpidem  Assessment/Plan: Stage IV coccygeal pressure ulcer. Multiple sclerosis Diabetes mellitus with peripheral vascular disease H/O Leukocytosis Anemia of chronic disease Chronic lymphedema Hypertension Sinus tachycardia-controlled  Add Vancomycin. SNF if can give IV antibiotic x 5-7 days.     LOS: 2 days    Dixie Dials  MD  05/12/2015, 7:41 PM

## 2015-05-13 LAB — BASIC METABOLIC PANEL
Anion gap: 8 (ref 5–15)
BUN: 5 mg/dL — ABNORMAL LOW (ref 6–20)
CHLORIDE: 106 mmol/L (ref 101–111)
CO2: 22 mmol/L (ref 22–32)
Calcium: 8.3 mg/dL — ABNORMAL LOW (ref 8.9–10.3)
Glucose, Bld: 35 mg/dL — CL (ref 65–99)
POTASSIUM: 3.4 mmol/L — AB (ref 3.5–5.1)
Sodium: 136 mmol/L (ref 135–145)

## 2015-05-13 LAB — CBC
HEMATOCRIT: 26.6 % — AB (ref 36.0–46.0)
HEMOGLOBIN: 8.2 g/dL — AB (ref 12.0–15.0)
MCH: 24.8 pg — AB (ref 26.0–34.0)
MCHC: 30.8 g/dL (ref 30.0–36.0)
MCV: 80.6 fL (ref 78.0–100.0)
Platelets: 545 10*3/uL — ABNORMAL HIGH (ref 150–400)
RBC: 3.3 MIL/uL — AB (ref 3.87–5.11)
RDW: 16.6 % — ABNORMAL HIGH (ref 11.5–15.5)
WBC: 9.2 10*3/uL (ref 4.0–10.5)

## 2015-05-13 LAB — GLUCOSE, CAPILLARY
Glucose-Capillary: 113 mg/dL — ABNORMAL HIGH (ref 65–99)
Glucose-Capillary: 60 mg/dL — ABNORMAL LOW (ref 65–99)

## 2015-05-13 MED ORDER — DEXTROSE 5 % IV SOLN: 2.0000 g | INTRAVENOUS | Status: DC

## 2015-05-13 MED ORDER — ASCORBIC ACID 500 MG PO TABS: 500.0000 mg | ORAL_TABLET | Freq: Every day | ORAL | Status: DC

## 2015-05-13 MED ORDER — HEPARIN SODIUM (PORCINE) 5000 UNIT/ML IJ SOLN: 5000.0000 [IU] | Freq: Three times a day (TID) | INTRAMUSCULAR | Status: DC

## 2015-05-13 MED ORDER — DOCUSATE SODIUM 100 MG PO CAPS: 100.0000 mg | ORAL_CAPSULE | Freq: Two times a day (BID) | ORAL | Status: DC

## 2015-05-13 MED ORDER — GLUCERNA SHAKE PO LIQD: 237.0000 mL | Freq: Two times a day (BID) | ORAL | Status: DC

## 2015-05-13 MED ORDER — ADULT MULTIVITAMIN W/MINERALS CH: 1.0000 | ORAL_TABLET | Freq: Every day | ORAL | Status: DC

## 2015-05-13 MED ORDER — POTASSIUM CHLORIDE 20 MEQ/15ML (10%) PO SOLN: 10.0000 meq | Freq: Every day | ORAL | Status: DC

## 2015-05-13 MED ORDER — POTASSIUM CHLORIDE 20 MEQ/15ML (10%) PO SOLN
10.0000 meq | Freq: Every day | ORAL | Status: DC
  Administered 2015-05-13: 10 meq via ORAL
  Filled 2015-05-13: qty 15

## 2015-05-13 MED ORDER — VANCOMYCIN HCL IN DEXTROSE 750-5 MG/150ML-% IV SOLN: 750.0000 mg | Freq: Two times a day (BID) | INTRAVENOUS | Status: DC

## 2015-05-13 MED ORDER — FERROUS SULFATE 325 (65 FE) MG PO TABS: 325.0000 mg | ORAL_TABLET | Freq: Every day | ORAL | Status: DC

## 2015-05-13 NOTE — Progress Notes (Signed)
Patient discharge teaching given, including activity, diet, follow up appointment, and mediations. Patient verbalized understanding of all discharge instructions. IV access was discontinued. Vitals are stable, Skin intact except as charted in most recent assessments. Patient to be escorted out by ambulance, to be driven to SNF.

## 2015-05-13 NOTE — Clinical Social Work Placement (Addendum)
   CLINICAL SOCIAL WORK PLACEMENT  NOTE  Date:  05/13/2015  Patient Details  Name: Jocelyn Sanchez MRN: [id number] Date of Birth: 1955-12-09  Clinical Social Work is seeking post-discharge placement for this patient at the [place] level of care (*CSW will initial, date and re-position this form in  chart as items are completed):  Yes   Patient/family provided with [place] Work Department's list of facilities offering this level of care within the geographic area requested by the patient (or if unable, by the patient's family).  Yes   Patient/family informed of their freedom to choose among providers that offer the needed level of care, that participate in Medicare, Medicaid or managed care program needed by the patient, have an available bed and are willing to accept the patient.  Yes   Patient/family informed of [facility]'s ownership interest in [facility] and [facility], as well as of the fact that they are under no obligation to receive care at these facilities.  PASRR submitted to EDS on       PASRR number received on       Existing PASRR number confirmed on 05/12/15     FL2 transmitted to all facilities in geographic area requested by pt/family on 05/13/15     FL2 transmitted to all facilities within larger geographic area on       Patient informed that his/her managed care company has contracts with or will negotiate with certain facilities, including the following:        Yes   Patient/family informed of bed offers received.  Patient chooses bed at [facility]     Physician recommends and patient chooses bed at      Patient to be transferred to [facility] on 05/13/15.  Patient to be transferred to facility by Ambulance     Patient family notified on 05/13/15 of transfer.  Name of family member notified:  Vicente Males by RN     PHYSICIAN       Additional Comment:  Per MD patient  ready for DC to [facility]. RN to facilitate DC as CSW only available by phone. RN will notify patient and family of DC, arrange transport by calling [phone], and will call report to [facility]. Unit CSW provided RN with Facesheet and Med Necessity form yesterday (on chart). CSW has confirmed with [facility] that the patient can admit today. CSW signing off.   UPDATE: Demetris Moore with facility states she does not have access to online system to retrieve DC summary. Charge RN has faxed DC Summary and SNF transport report over to facility. Demetris confirms patient can still transport at 1:30PM (transport arranged by Therapist, sports). CSW signing off. _______________________________________________ Rigoberto Noel, LCSW 05/13/2015, 11:38 AM

## 2015-05-13 NOTE — Discharge Summary (Signed)
Physician Discharge Summary  Patient ID: Jocelyn Sanchez MRN: [id number] DOB/AGE: [birth year] 59 y.o.  Admit date: 05/10/2015 Discharge date: 05/13/2015  Admission Diagnoses: Stage IV coccygeal pressure ulcer. Multiple sclerosis Diabetes mellitus with peripheral vascular disease H/O Leukocytosis Anemia of chronic disease Chronic lymphedema Hypertension Sinus tachycardia-controlled  Discharge Diagnoses:  Principal Problem:   Decubitus ulcer of coccyx, stage 4 (HCC) Active Problems:   Multiple sclerosis (HCC)   Diabetes mellitus with peripheral vascular disease (HCC)   Anemia of chronic disease   Chronic Lymphedema of both lower legs with stasis ulcers   Back pain   Hypertension   Insomnia   H/O leukocytosis   Paraplegia   Upper extremities weakness  Discharged Condition: stable  Hospital Course: 59 y.o. female , single, lives with mother, wheelchair mobile, PMH of multiple sclerosis, DM 2, HTN, chronic lower extremity lymphedema, chronic anemia has chronic back pain and slowly worsening coccygeal ulcer, stage IV. Wound care consult was obtained. Patient was treated with IV ceftriaxone and Vancomycin along with wound packing. She was discharged to area nursing home for chronic wound care. She will be followed by me in 1 month and earlier as needed.  Consults: Woundcare  Significant Diagnostic Studies: labs: Elevated WBC count became normal in 3 days of IV antibiotic use. Low Hgb of 8.2 elevated platelets count of 565 K. Near normal electrolytes. Glucose of 133 mg.   Treatments: antibiotics: vancomycin and ceftriaxone  Discharge Exam: Blood pressure 120/63, pulse 89, temperature 98.5 F (36.9 C), temperature source Oral, resp. rate 18, height 5\' 3"  (1.6 m), weight 69.854 kg (154 lb), SpO2 100 %. HEENT: [place]/AT, Eyes-Brown, PERL, EOMI, Conjunctiva-Pale, Sclera-Non-icteric Neck: No JVD, No bruit, Trachea midline. Lungs: Clear, Bilateral. Cardiac: Regular rhythm, normal S1  and S2, no S3.  Abdomen: Soft, non-tender. Extremities: Wounds per wound care consult: Sacrum with stage 4 pressure injury; 5.5X6.5X2 cm, with tunneling at 12:00 o'clock to 6.5cm. Wound is 85% red, 15% yellow, bone palpable with swab, large amt yellow drainage, no odor. Next to this, there is a stage 3 wound, 2X1X.2cm, 100% red and moist. Right ischium with unstageable pressure injury; 2.5X3.5cm, 100% yellow slough tightly adhered, no odor, small amt yellow drainage. Pressure Ulcer POA: Yes Left leg with full thickness stasis ulcer; 1X1X.1cm, 100% yellow, mod amt yellow drainage, small amt yellow drainage. Right posterior leg with patchy areas of full thickness stasis ulcers, affected area approx 8X9cm, all less than .1cm depth, red wound beds, small amt yellow drainage, no odor. Bilat legs with generalized edema bilat. CNS: AxOx3, Cranial nerves grossly intact, Paraplegic and small movements of all joints of both upper extremities. Skin: Warm and dry.  Disposition: 06-SNF     Medication List    TAKE these medications        acetaminophen 325 MG tablet  Commonly known as:  TYLENOL  Take 2 tablets (650 mg total) by mouth every 6 (six) hours as needed for mild pain (or Fever >/= 101).     ascorbic acid 500 MG tablet  Commonly known as:  VITAMIN C  Take 1 tablet (500 mg total) by mouth daily.     aspirin 81 MG EC tablet  Take 1 tablet (81 mg total) by mouth daily.     baclofen 10 MG tablet  Commonly known as:  LIORESAL  Take 0.5 tablets (5 mg total) by mouth 3 (three) times daily.     carvedilol 3.125 MG tablet  Commonly known as:  COREG  Take 3.125 mg by  mouth 2 (two) times daily.     cefTRIAXone 2 g in dextrose 5 % 50 mL  Inject 2 g into the vein daily.     diphenhydramine-acetaminophen 25-500 MG Tabs tablet  Commonly known as:  TYLENOL PM  Take 1 tablet by mouth at bedtime as needed (for sleep).     docusate sodium 100 MG capsule  Commonly known as:  COLACE  Take 1  capsule (100 mg total) by mouth 2 (two) times daily.     feeding supplement (GLUCERNA SHAKE) Liqd  Take 237 mLs by mouth 2 (two) times daily between meals.     ferrous sulfate 325 (65 FE) MG tablet  Take 1 tablet (325 mg total) by mouth daily with breakfast.     glipiZIDE 5 MG tablet  Commonly known as:  GLUCOTROL  Take 0.5 tablets by mouth 2 (two) times daily.     heparin 5000 UNIT/ML injection  Inject 1 mL (5,000 Units total) into the skin every 8 (eight) hours.     HYDROcodone-acetaminophen 5-325 MG tablet  Commonly known as:  NORCO/VICODIN  Take 2 tablets by mouth every 4 (four) hours as needed for moderate pain.     methocarbamol 500 MG tablet  Commonly known as:  ROBAXIN  Take 0.5 tablets (250 mg total) by mouth every 8 (eight) hours as needed for muscle spasms.     multivitamin with minerals Tabs tablet  Take 1 tablet by mouth daily.     potassium chloride 20 MEQ/15ML (10%) Soln  Take 7.5 mLs (10 mEq total) by mouth daily.     Vancomycin 750 MG/150ML Soln  Commonly known as:  VANCOCIN  Inject 150 mLs (750 mg total) into the vein every 12 (twelve) hours.     zolpidem 5 MG tablet  Commonly known as:  AMBIEN  Take 1 tablet (5 mg total) by mouth at bedtime as needed for sleep (insomnia).           Follow-up Information    Follow up with [facility], MD. Schedule an appointment as soon as possible for a visit in 1 month.   Specialty:  Cardiology   Contact information:   [place] [address] [phone]       Signed: [name] 05/13/2015, 11:07 AM

## 2015-05-13 NOTE — Progress Notes (Signed)
Hypoglycemic Event  CBG: 35  Treatment: 15 GM carbohydrate snack  Symptoms: None  Follow-up CBG: Time:0521 CBG Result:113  Possible Reasons for Event: Unknown  Comments/MD notified: Dixie Dials MD    Pedro Earls

## 2015-05-17 ENCOUNTER — Non-Acute Institutional Stay (SKILLED_NURSING_FACILITY): Payer: Medicare Other | Admitting: Adult Health

## 2015-05-17 DIAGNOSIS — L89154 Pressure ulcer of sacral region, stage 4: Secondary | ICD-10-CM

## 2015-05-17 DIAGNOSIS — G35 Multiple sclerosis: Secondary | ICD-10-CM | POA: Diagnosis not present

## 2015-05-17 DIAGNOSIS — I10 Essential (primary) hypertension: Secondary | ICD-10-CM | POA: Diagnosis not present

## 2015-05-17 DIAGNOSIS — E876 Hypokalemia: Secondary | ICD-10-CM

## 2015-05-18 ENCOUNTER — Other Ambulatory Visit: Payer: Self-pay | Admitting: Internal Medicine

## 2015-05-18 ENCOUNTER — Non-Acute Institutional Stay (SKILLED_NURSING_FACILITY): Payer: Medicare Other | Admitting: Internal Medicine

## 2015-05-18 ENCOUNTER — Ambulatory Visit (HOSPITAL_COMMUNITY)
Admission: RE | Admit: 2015-05-18 | Discharge: 2015-05-18 | Disposition: A | Discharge status: Home / Self Care | Payer: Medicare Other | Source: Ambulatory Visit | Attending: Internal Medicine | Admitting: Internal Medicine

## 2015-05-18 ENCOUNTER — Encounter: Payer: Self-pay | Admitting: Internal Medicine

## 2015-05-18 ENCOUNTER — Other Ambulatory Visit: Payer: Self-pay | Admitting: *Deleted

## 2015-05-18 DIAGNOSIS — Z9289 Personal history of other medical treatment: Secondary | ICD-10-CM

## 2015-05-18 DIAGNOSIS — L89154 Pressure ulcer of sacral region, stage 4: Secondary | ICD-10-CM | POA: Diagnosis not present

## 2015-05-18 DIAGNOSIS — I890 Lymphedema, not elsewhere classified: Secondary | ICD-10-CM | POA: Diagnosis not present

## 2015-05-18 DIAGNOSIS — L89159 Pressure ulcer of sacral region, unspecified stage: Secondary | ICD-10-CM | POA: Insufficient documentation

## 2015-05-18 DIAGNOSIS — E1151 Type 2 diabetes mellitus with diabetic peripheral angiopathy without gangrene: Secondary | ICD-10-CM

## 2015-05-18 DIAGNOSIS — L089 Local infection of the skin and subcutaneous tissue, unspecified: Secondary | ICD-10-CM | POA: Insufficient documentation

## 2015-05-18 DIAGNOSIS — D638 Anemia in other chronic diseases classified elsewhere: Secondary | ICD-10-CM

## 2015-05-18 DIAGNOSIS — G35 Multiple sclerosis: Secondary | ICD-10-CM | POA: Diagnosis not present

## 2015-05-18 DIAGNOSIS — Z978 Presence of other specified devices: Secondary | ICD-10-CM

## 2015-05-18 DIAGNOSIS — Z960 Presence of urogenital implants: Secondary | ICD-10-CM

## 2015-05-18 MED ORDER — ZOLPIDEM TARTRATE 5 MG PO TABS: 5.0000 mg | ORAL_TABLET | Freq: Every evening | ORAL | Status: DC | PRN

## 2015-05-18 NOTE — Progress Notes (Signed)
Patient ID: EURA MCCAUSLIN, female   DOB: 02-23-1956, 59 y.o.   MRN: [id number]    HISTORY AND PHYSICAL   DATE: 05/18/15  Location:  [place] of Service: SNF [phone])   Extended Emergency Contact Information Primary Emergency Contact: [place] Mobile Phone: [phone] Relation: Son Secondary Emergency Contact: Aleene [place] Phone: [phone] Relation: Sister  Advanced Directive information  FULL CODE  Chief Complaint  Patient presents with  . New Admit To SNF    HPI:  59 yo female seen today as a new admission to SNF following hospital stay for stage 4 coccygeal pressure ulcer, MS, ST, DM with PAD. She was d/cd on IV rocephin and vanco with no stop date at this time. Wound packed. She had stasis ulcers on b/l legs that were wrapped.,  She has no concerns today. Appetite is excellent. No falls. She will be going for PICC line insertion later today to resume IV abx. CBGs 120-190. No low BS reactions  DM - stable on po med. Followed by neurology  MS/paraplegic - dx in 2001. She has had several exac in the last year. Followed by neurology  Neurogenic bladder - due to MS; chronic indwelling foley cath  Lymphedema - chronic. stable  HTN - BP controlled with coreg  Anemia - stable on iron  She takes vitamin and mineral supplements as well as nutritional supplements   Past Medical History  Diagnosis Date  . MS (multiple sclerosis) ([place])   . HTN (hypertension)   . DM II (diabetes mellitus, type II), controlled ([place])   . Lymphedema   . Anemia   . Gait disorder     Past Surgical History  Procedure Laterality Date  . Flexible sigmoidoscopy Left 03/09/2015    Procedure: FLEXIBLE SIGMOIDOSCOPY;  Surgeon: Carol Ada, MD;  Location: [facility];  Service: Endoscopy;  Laterality: Left;    Patient Care Team: Dixie Dials, MD as PCP - General (Cardiology)  Social History    Social History  . Marital Status: Single    Spouse Name: N/A  . Number of Children: 3  . Years of Education: N/A   Occupational History  .     Social History Main Topics  . Smoking status: Former Smoker    Types: Cigarettes  . Smokeless tobacco: Never Used  . Alcohol Use: No  . Drug Use: No  . Sexual Activity: No   Other Topics Concern  . Not on file   Social History Narrative   Patient is single.   Patient has three children.   Patient is disabled.   Patient is right-handed.   Patient does not drink any caffeine.     reports that she has quit smoking. Her smoking use included Cigarettes. She has never used smokeless tobacco. She reports that she does not drink alcohol or use illicit drugs.  Family History  Problem Relation Age of Onset  . Multiple sclerosis Mother    Family Status  Relation Status Death Age  . Mother Alive   . Father Deceased 7     There is no immunization history on file for this patient.  Allergies  Allergen Reactions  . Sulfa Antibiotics Shortness Of Breath and Swelling    Medications: Patient's Medications  New Prescriptions   No medications on file  Previous Medications   ACETAMINOPHEN (TYLENOL) 325 MG TABLET    Take 2 tablets (650 mg total) by mouth every  6 (six) hours as needed for mild pain (or Fever >/= 101).   ASPIRIN EC 81 MG EC TABLET    Take 1 tablet (81 mg total) by mouth daily.   BACLOFEN (LIORESAL) 10 MG TABLET    Take 0.5 tablets (5 mg total) by mouth 3 (three) times daily.   CARVEDILOL (COREG) 3.125 MG TABLET    Take 3.125 mg by mouth 2 (two) times daily.   CEFTRIAXONE 2 G IN DEXTROSE 5 % 50 ML    Inject 2 g into the vein daily.   DIPHENHYDRAMINE-ACETAMINOPHEN (TYLENOL PM) 25-500 MG TABS TABLET    Take 1 tablet by mouth at bedtime as needed (for sleep).   DOCUSATE SODIUM (COLACE) 100 MG CAPSULE    Take 1 capsule (100 mg total) by mouth 2 (two) times daily.   FEEDING SUPPLEMENT, GLUCERNA SHAKE, (GLUCERNA SHAKE) LIQD     Take 237 mLs by mouth 2 (two) times daily between meals.   FERROUS SULFATE 325 (65 FE) MG TABLET    Take 1 tablet (325 mg total) by mouth daily with breakfast.   GLIPIZIDE (GLUCOTROL) 5 MG TABLET    Take 0.5 tablets by mouth 2 (two) times daily.   HEPARIN 5000 UNIT/ML INJECTION    Inject 1 mL (5,000 Units total) into the skin every 8 (eight) hours.   HYDROCODONE-ACETAMINOPHEN (NORCO/VICODIN) 5-325 MG PER TABLET    Take 2 tablets by mouth every 4 (four) hours as needed for moderate pain.   METHOCARBAMOL (ROBAXIN) 500 MG TABLET    Take 0.5 tablets (250 mg total) by mouth every 8 (eight) hours as needed for muscle spasms.   MULTIPLE VITAMIN (MULTIVITAMIN WITH MINERALS) TABS TABLET    Take 1 tablet by mouth daily.   POTASSIUM CHLORIDE 20 MEQ/15ML (10%) SOLN    Take 7.5 mLs (10 mEq total) by mouth daily.   VANCOMYCIN (VANCOCIN) 750 MG/150ML SOLN    Inject 150 mLs (750 mg total) into the vein every 12 (twelve) hours.   VITAMIN C (VITAMIN C) 500 MG TABLET    Take 1 tablet (500 mg total) by mouth daily.   ZOLPIDEM (AMBIEN) 5 MG TABLET    Take 1 tablet (5 mg total) by mouth at bedtime as needed for sleep (insomnia).  Modified Medications   No medications on file  Discontinued Medications   No medications on file    Review of Systems  Genitourinary: Positive for difficulty urinating (has chronic foley cath).  Musculoskeletal: Positive for arthralgias.  Skin: Positive for wound.  Neurological: Positive for weakness.  All other systems reviewed and are negative.   Filed Vitals:   05/18/15 1221  BP: 154/72  Pulse: 97  Temp: 98.2 F (36.8 C)  Weight: 139 lb (63.05 kg)   Body mass index is 24.63 kg/(m^2).  Physical Exam  Constitutional: She is oriented to person, place, and time. She appears well-developed and well-nourished.  HENT:  Mouth/Throat: Oropharynx is clear and moist. No oropharyngeal exudate.  Eyes: Pupils are equal, round, and reactive to light. No scleral icterus.  Neck: Neck  supple. Carotid bruit is not present. No tracheal deviation present. No thyromegaly present.  Cardiovascular: Normal rate, regular rhythm, normal heart sounds and intact distal pulses.  Exam reveals no gallop and no friction rub.   No murmur heard. +1 pitting LE edema b/l   Pulmonary/Chest: Effort normal and breath sounds normal. No stridor. No respiratory distress. She has no wheezes. She has no rales.  Abdominal: Soft. Bowel sounds are normal. She  exhibits no distension and no mass. There is no hepatomegaly. There is no tenderness. There is no rebound and no guarding.  Genitourinary:  Foley cath DTG clear yellow urine  Musculoskeletal: She exhibits edema and tenderness.  B/l upper and lower extremity contractures with marked reduced ROM and paraplegia  Lymphadenopathy:    She has no cervical adenopathy.  Neurological: She is alert and oriented to person, place, and time.  Skin: Skin is warm and dry. No rash noted. No erythema.  Wound on coccyx per nursing, stage 4. B/l LE scaling noted. dsg intact b/l  Psychiatric: She has a normal mood and affect. Her behavior is normal. Thought content normal.     Labs reviewed: Admission on 05/10/2015, Discharged on 05/13/2015  Component Date Value Ref Range Status  . Sodium 05/10/2015 135  135 - 145 mmol/L Final  . Potassium 05/10/2015 3.8  3.5 - 5.1 mmol/L Final  . Chloride 05/10/2015 105  101 - 111 mmol/L Final  . CO2 05/10/2015 17* 22 - 32 mmol/L Final  . Glucose, Bld 05/10/2015 133* 65 - 99 mg/dL Final  . BUN 05/10/2015 17  6 - 20 mg/dL Final  . Creatinine, Ser 05/10/2015 0.46  0.44 - 1.00 mg/dL Final  . Calcium 05/10/2015 8.7* 8.9 - 10.3 mg/dL Final  . Total Protein 05/10/2015 7.0  6.5 - 8.1 g/dL Final  . Albumin 05/10/2015 2.2* 3.5 - 5.0 g/dL Final  . AST 05/10/2015 14* 15 - 41 U/L Final  . ALT 05/10/2015 10* 14 - 54 U/L Final  . Alkaline Phosphatase 05/10/2015 76  38 - 126 U/L Final  . Total Bilirubin 05/10/2015 0.2* 0.3 - 1.2 mg/dL  Final  . GFR calc non Af Amer 05/10/2015 >60  >60 mL/min Final  . GFR calc Af Amer 05/10/2015 >60  >60 mL/min Final   Comment: (NOTE) The eGFR has been calculated using the CKD EPI equation. This calculation has not been validated in all clinical situations. eGFR's persistently <60 mL/min signify possible Chronic Kidney Disease.   . Anion gap 05/10/2015 13  5 - 15 Final  . WBC 05/10/2015 22.4* 4.0 - 10.5 K/uL Final  . RBC 05/10/2015 3.47* 3.87 - 5.11 MIL/uL Final  . Hemoglobin 05/10/2015 8.9* 12.0 - 15.0 g/dL Final  . HCT 05/10/2015 28.1* 36.0 - 46.0 % Final  . MCV 05/10/2015 81.0  78.0 - 100.0 fL Final  . MCH 05/10/2015 25.6* 26.0 - 34.0 pg Final  . MCHC 05/10/2015 31.7  30.0 - 36.0 g/dL Final  . RDW 05/10/2015 16.6* 11.5 - 15.5 % Final  . Platelets 05/10/2015 565* 150 - 400 K/uL Final  . Neutrophils Relative % 05/10/2015 87   Final  . Neutro Abs 05/10/2015 19.6* 1.7 - 7.7 K/uL Final  . Lymphocytes Relative 05/10/2015 6   Final  . Lymphs Abs 05/10/2015 1.3  0.7 - 4.0 K/uL Final  . Monocytes Relative 05/10/2015 6   Final  . Monocytes Absolute 05/10/2015 1.3* 0.1 - 1.0 K/uL Final  . Eosinophils Relative 05/10/2015 1   Final  . Eosinophils Absolute 05/10/2015 0.1  0.0 - 0.7 K/uL Final  . Basophils Relative 05/10/2015 0   Final  . Basophils Absolute 05/10/2015 0.1  0.0 - 0.1 K/uL Final  . MRSA by PCR 05/10/2015 NEGATIVE  NEGATIVE Final   Comment:        The GeneXpert MRSA Assay (FDA approved for NASAL specimens only), is one component of a comprehensive MRSA colonization surveillance program. It is not intended to diagnose MRSA  infection nor to guide or monitor treatment for MRSA infections.   . Sodium 05/13/2015 136  135 - 145 mmol/L Final  . Potassium 05/13/2015 3.4* 3.5 - 5.1 mmol/L Final  . Chloride 05/13/2015 106  101 - 111 mmol/L Final  . CO2 05/13/2015 22  22 - 32 mmol/L Final  . Glucose, Bld 05/13/2015 35* 65 - 99 mg/dL Final   Comment: REPEATED TO VERIFY CRITICAL  RESULT CALLED TO, READ BACK BY AND VERIFIED WITH: LE K,RN 05/13/15 [place]   . BUN 05/13/2015 5* 6 - 20 mg/dL Final  . Creatinine, Ser 05/13/2015 <0.30* 0.44 - 1.00 mg/dL Final  . Calcium 05/13/2015 8.3* 8.9 - 10.3 mg/dL Final  . GFR calc non Af Amer 05/13/2015 NOT CALCULATED  >60 mL/min Final  . GFR calc Af Amer 05/13/2015 NOT CALCULATED  >60 mL/min Final   Comment: (NOTE) The eGFR has been calculated using the CKD EPI equation. This calculation has not been validated in all clinical situations. eGFR's persistently <60 mL/min signify possible Chronic Kidney Disease.   . Anion gap 05/13/2015 8  5 - 15 Final  . WBC 05/13/2015 9.2  4.0 - 10.5 K/uL Final  . RBC 05/13/2015 3.30* 3.87 - 5.11 MIL/uL Final  . Hemoglobin 05/13/2015 8.2* 12.0 - 15.0 g/dL Final  . HCT 05/13/2015 26.6* 36.0 - 46.0 % Final  . MCV 05/13/2015 80.6  78.0 - 100.0 fL Final  . MCH 05/13/2015 24.8* 26.0 - 34.0 pg Final  . MCHC 05/13/2015 30.8  30.0 - 36.0 g/dL Final  . RDW 05/13/2015 16.6* 11.5 - 15.5 % Final  . Platelets 05/13/2015 545* 150 - 400 K/uL Final  . Glucose-Capillary 05/13/2015 60* 65 - 99 mg/dL Final  . Glucose-Capillary 05/13/2015 113* 65 - 99 mg/dL Final  Admission on 03/21/2015, Discharged on 03/21/2015  Component Date Value Ref Range Status  . Glucose-Capillary 03/21/2015 59* 65 - 99 mg/dL Final  . Glucose-Capillary 03/21/2015 82  65 - 99 mg/dL Final  . WBC 03/21/2015 14.3* 4.0 - 10.5 K/uL Final  . RBC 03/21/2015 4.02  3.87 - 5.11 MIL/uL Final  . Hemoglobin 03/21/2015 10.9* 12.0 - 15.0 g/dL Final  . HCT 03/21/2015 33.7* 36.0 - 46.0 % Final  . MCV 03/21/2015 83.8  78.0 - 100.0 fL Final  . MCH 03/21/2015 27.1  26.0 - 34.0 pg Final  . MCHC 03/21/2015 32.3  30.0 - 36.0 g/dL Final  . RDW 03/21/2015 16.4* 11.5 - 15.5 % Final  . Platelets 03/21/2015 849* 150 - 400 K/uL Final  . Neutrophils Relative % 03/21/2015 74   Final  . Neutro Abs 03/21/2015 10.5* 1.7 - 7.7 K/uL Final  . Lymphocytes Relative  03/21/2015 15   Final  . Lymphs Abs 03/21/2015 2.1  0.7 - 4.0 K/uL Final  . Monocytes Relative 03/21/2015 9   Final  . Monocytes Absolute 03/21/2015 1.3* 0.1 - 1.0 K/uL Final  . Eosinophils Relative 03/21/2015 2   Final  . Eosinophils Absolute 03/21/2015 0.3  0.0 - 0.7 K/uL Final  . Basophils Relative 03/21/2015 0   Final  . Basophils Absolute 03/21/2015 0.1  0.0 - 0.1 K/uL Final  . Sodium 03/21/2015 134* 135 - 145 mmol/L Final  . Potassium 03/21/2015 4.8  3.5 - 5.1 mmol/L Final  . Chloride 03/21/2015 103  101 - 111 mmol/L Final  . CO2 03/21/2015 24  22 - 32 mmol/L Final  . Glucose, Bld 03/21/2015 75  65 - 99 mg/dL Final  . BUN 03/21/2015 10  6 -  20 mg/dL Final  . Creatinine, Ser 03/21/2015 0.37* 0.44 - 1.00 mg/dL Final  . Calcium 03/21/2015 8.3* 8.9 - 10.3 mg/dL Final  . Total Protein 03/21/2015 7.1  6.5 - 8.1 g/dL Final  . Albumin 03/21/2015 2.3* 3.5 - 5.0 g/dL Final  . AST 03/21/2015 32  15 - 41 U/L Final  . ALT 03/21/2015 12* 14 - 54 U/L Final  . Alkaline Phosphatase 03/21/2015 94  38 - 126 U/L Final  . Total Bilirubin 03/21/2015 0.5  0.3 - 1.2 mg/dL Final  . GFR calc non Af Amer 03/21/2015 >60  >60 mL/min Final  . GFR calc Af Amer 03/21/2015 >60  >60 mL/min Final   Comment: (NOTE) The eGFR has been calculated using the CKD EPI equation. This calculation has not been validated in all clinical situations. eGFR's persistently <60 mL/min signify possible Chronic Kidney Disease.   . Anion gap 03/21/2015 7  5 - 15 Final  . Glucose-Capillary 03/21/2015 87  65 - 99 mg/dL Final  Admission on 03/01/2015, Discharged on 03/12/2015  No results displayed because visit has over 200 results.    Admission on 02/17/2015, Discharged on 02/18/2015  Component Date Value Ref Range Status  . Color, Urine 02/17/2015 YELLOW  YELLOW Final  . APPearance 02/17/2015 CLEAR  CLEAR Final  . Specific Gravity, Urine 02/17/2015 1.017  1.005 - 1.030 Final  . pH 02/17/2015 5.5  5.0 - 8.0 Final  . Glucose,  UA 02/17/2015 NEGATIVE  NEGATIVE mg/dL Final  . Hgb urine dipstick 02/17/2015 LARGE* NEGATIVE Final  . Bilirubin Urine 02/17/2015 NEGATIVE  NEGATIVE Final  . Ketones, ur 02/17/2015 NEGATIVE  NEGATIVE mg/dL Final  . Protein, ur 02/17/2015 NEGATIVE  NEGATIVE mg/dL Final  . Urobilinogen, UA 02/17/2015 0.2  0.0 - 1.0 mg/dL Final  . Nitrite 02/17/2015 NEGATIVE  NEGATIVE Final  . Leukocytes, UA 02/17/2015 SMALL* NEGATIVE Final  . Specimen Description 02/17/2015 URINE, CATHETERIZED   Final  . Special Requests 02/17/2015 Normal   Final  . Culture 02/17/2015    Final                   Value:NO GROWTH 2 DAYS Performed at [facility]   . Report Status 02/17/2015 02/19/2015 FINAL   Final  . Sodium 02/17/2015 136  135 - 145 mmol/L Final  . Potassium 02/17/2015 3.6  3.5 - 5.1 mmol/L Final  . Chloride 02/17/2015 108  101 - 111 mmol/L Final  . CO2 02/17/2015 17* 22 - 32 mmol/L Final  . Glucose, Bld 02/17/2015 50* 65 - 99 mg/dL Final  . BUN 02/17/2015 71* 6 - 20 mg/dL Final  . Creatinine, Ser 02/17/2015 0.84  0.44 - 1.00 mg/dL Final  . Calcium 02/17/2015 9.3  8.9 - 10.3 mg/dL Final  . GFR calc non Af Amer 02/17/2015 >60  >60 mL/min Final  . GFR calc Af Amer 02/17/2015 >60  >60 mL/min Final   Comment: (NOTE) The eGFR has been calculated using the CKD EPI equation. This calculation has not been validated in all clinical situations. eGFR's persistently <60 mL/min signify possible Chronic Kidney Disease.   . Anion gap 02/17/2015 11  5 - 15 Final  . WBC 02/17/2015 7.8  4.0 - 10.5 K/uL Final  . RBC 02/17/2015 3.76* 3.87 - 5.11 MIL/uL Final  . Hemoglobin 02/17/2015 10.2* 12.0 - 15.0 g/dL Final  . HCT 02/17/2015 30.7* 36.0 - 46.0 % Final  . MCV 02/17/2015 81.6  78.0 - 100.0 fL Final  . MCH 02/17/2015 27.1  26.0 - 34.0 pg Final  . MCHC 02/17/2015 33.2  30.0 - 36.0 g/dL Final  . RDW 02/17/2015 15.0  11.5 - 15.5 % Final  . Platelets 02/17/2015 573* 150 - 400 K/uL Final  . Neutrophils Relative %  02/17/2015 63  43 - 77 % Final  . Neutro Abs 02/17/2015 5.0  1.7 - 7.7 K/uL Final  . Lymphocytes Relative 02/17/2015 21  12 - 46 % Final  . Lymphs Abs 02/17/2015 1.6  0.7 - 4.0 K/uL Final  . Monocytes Relative 02/17/2015 11  3 - 12 % Final  . Monocytes Absolute 02/17/2015 0.9  0.1 - 1.0 K/uL Final  . Eosinophils Relative 02/17/2015 4  0 - 5 % Final  . Eosinophils Absolute 02/17/2015 0.3  0.0 - 0.7 K/uL Final  . Basophils Relative 02/17/2015 1  0 - 1 % Final  . Basophils Absolute 02/17/2015 0.0  0.0 - 0.1 K/uL Final  . WBC, UA 02/17/2015 7-10  <3 WBC/hpf Final  . RBC / HPF 02/17/2015 11-20  <3 RBC/hpf Final  . Bacteria, UA 02/17/2015 FEW* RARE Final  . Glucose-Capillary 02/17/2015 32* 65 - 99 mg/dL Final  . Comment 1 02/17/2015 Notify RN   Final  . Glucose-Capillary 02/17/2015 155* 65 - 99 mg/dL Final    No results found.   Assessment/Plan   ICD-9-CM ICD-10-CM   1. Decubitus ulcer of coccygeal region, stage IV ([place]) 707.03 L89.154    707.24    2. Diabetes mellitus with peripheral vascular disease ([place]) - stable 250.70 E11.51    443.81    3. Multiple sclerosis ([facility]) -stable 340 G35   4. Anemia of chronic disease - stable 285.29 D63.8   5. Lymphedema - stable 457.1 I89.0   6. Foley catheter in place due to urinary retention from neurogenic bladder V45.89 Z92.89     Cont current meds as ordered  IV abx stop date undetermined at this time  PICC to be inserted today for IV abx to tx sacral wound  PT/OT as indicated  F/u with specialists as scheduled  Foley cath care as indicated  GOAL: short term rehab and d/c home when medically appropriate. Communicated with pt and nursing.  Will follow  Darcel Zick S. Perlie Gold  [facility] [address] [place], [place] [address] [phone] Cell (Monday-Friday 8 AM - 5 PM) [phone] After 5 PM and follow prompts

## 2015-05-18 NOTE — Telephone Encounter (Signed)
[facility]

## 2015-05-18 NOTE — Procedures (Signed)
Successful placement of single lumen PICC line to left basilic vein. Length 38 cm Tip at lower SVC/RA No complications Ready for use.  WENDY S BLAIR PA-C @TIMESTAMP @

## 2015-06-17 ENCOUNTER — Encounter: Payer: Self-pay | Admitting: Adult Health

## 2015-06-17 DIAGNOSIS — I10 Essential (primary) hypertension: Secondary | ICD-10-CM | POA: Insufficient documentation

## 2015-06-17 NOTE — Progress Notes (Signed)
Patient ID: Jocelyn Sanchez, female   DOB: 1955/11/20, 59 y.o.   MRN: [id number]   Facility: [facility]      Allergies  Allergen Reactions  . Sulfa Antibiotics Shortness Of Breath and Swelling    Chief Complaint  Patient presents with  . Hospitalization Follow-up    HPI:  She has been hospitalized for a stage IV coccyx ulcer with infection. She has been started on abt and is here for short term rehab with her goal to return back home. She is not complaining of pain at this time. There are no nursing concerns at this time.    Past Medical History  Diagnosis Date  . MS (multiple sclerosis) ([place])   . HTN (hypertension)   . DM II (diabetes mellitus, type II), controlled ([place])   . Lymphedema   . Anemia   . Gait disorder     Past Surgical History  Procedure Laterality Date  . Flexible sigmoidoscopy Left 03/09/2015    Procedure: FLEXIBLE SIGMOIDOSCOPY;  Surgeon: Carol Ada, MD;  Location: [facility];  Service: Endoscopy;  Laterality: Left;    VITAL SIGNS BP 120/56 mmHg  Pulse 68  Ht 5\' 3"  (1.6 m)  Wt 139 lb (63.05 kg)  BMI 24.63 kg/m2  Patient's Medications  New Prescriptions   No medications on file  Previous Medications   ACETAMINOPHEN (TYLENOL) 325 MG TABLET    Take 2 tablets (650 mg total) by mouth every 6 (six) hours as needed for mild pain (or Fever >/= 101).   ASPIRIN EC 81 MG EC TABLET    Take 1 tablet (81 mg total) by mouth daily.   BACLOFEN (LIORESAL) 10 MG TABLET    Take 0.5 tablets (5 mg total) by mouth 3 (three) times daily.   CARVEDILOL (COREG) 3.125 MG TABLET    Take 3.125 mg by mouth 2 (two) times daily.   CEFTRIAXONE 2 G IN DEXTROSE 5 % 50 ML    Inject 2 g into the vein daily.   DIPHENHYDRAMINE-ACETAMINOPHEN (TYLENOL PM) 25-500 MG TABS TABLET    Take 1 tablet by mouth at bedtime as needed (for sleep).   DOCUSATE SODIUM (COLACE) 100 MG CAPSULE    Take 1 capsule (100 mg total) by mouth 2 (two) times daily.   FEEDING SUPPLEMENT, GLUCERNA SHAKE,  (GLUCERNA SHAKE) LIQD    Take 237 mLs by mouth 2 (two) times daily between meals.   FERROUS SULFATE 325 (65 FE) MG TABLET    Take 1 tablet (325 mg total) by mouth daily with breakfast.   GLIPIZIDE (GLUCOTROL) 5 MG TABLET    Take 0.5 tablets by mouth 2 (two) times daily.   HEPARIN 5000 UNIT/ML INJECTION    Inject 1 mL (5,000 Units total) into the skin every 8 (eight) hours.   HYDROCODONE-ACETAMINOPHEN (NORCO/VICODIN) 5-325 MG PER TABLET    Take 2 tablets by mouth every 4 (four) hours as needed for moderate pain.   METHOCARBAMOL (ROBAXIN) 500 MG TABLET    Take 0.5 tablets (250 mg total) by mouth every 8 (eight) hours as needed for muscle spasms.   MULTIPLE VITAMIN (MULTIVITAMIN WITH MINERALS) TABS TABLET    Take 1 tablet by mouth daily.   POTASSIUM CHLORIDE 20 MEQ/15ML (10%) SOLN    Take 7.5 mLs (10 mEq total) by mouth daily.   VANCOMYCIN (VANCOCIN) 750 MG/150ML SOLN    Inject 150 mLs (750 mg total) into the vein every 12 (twelve) hours.   VITAMIN C (VITAMIN C) 500 MG TABLET  Take 1 tablet (500 mg total) by mouth daily.   ZOLPIDEM (AMBIEN) 5 MG TABLET    Take 1 tablet (5 mg total) by mouth at bedtime as needed for sleep (insomnia).  Modified Medications   No medications on file  Discontinued Medications   No medications on file     SIGNIFICANT DIAGNOSTIC EXAMS   LABS REVIEWED:   05-10-15: wbc 22.;4 hgb 8.9; hct 28.1; mcv 81.0; plt 565; glucose 133; bun 17; creat 0.46; k+ 3.8; na++135; liver normal albumin 2.2 04-21-15: wbc 9.2; hgb 8.2; hct 26.6; mcv 80.6; plt 545; glucose 35; bun 5; creat <0.30; k+ 3.4; na++136   Review of Systems  Constitutional: Negative for malaise/fatigue.  Respiratory: Negative for cough and shortness of breath.   Cardiovascular: Negative for chest pain, palpitations and leg swelling.  Gastrointestinal: Negative for heartburn, abdominal pain and constipation.  Genitourinary:       Has foley   Musculoskeletal: Negative for myalgias and joint pain.  Skin:        Has skin ulcers present   Psychiatric/Behavioral: The patient is not nervous/anxious.      Physical Exam  Constitutional: No distress.  Eyes: Conjunctivae are normal.  Neck: Neck supple. No JVD present. No thyromegaly present.  Cardiovascular: Normal rate, regular rhythm and intact distal pulses.   Respiratory: Effort normal and breath sounds normal. No respiratory distress. She has no wheezes.  GI: Soft. Bowel sounds are normal. She exhibits no distension. There is no tenderness.  Genitourinary:  Has foley   Musculoskeletal: She exhibits no edema.  Does not move lower extremities    Lymphadenopathy:    She has no cervical adenopathy.  Neurological: She is alert.  Skin: Skin is warm and dry. She is not diaphoretic.  Sacrum: 5.5 x 6.5 x 2 cm with undermining 6.5 cm 15% slought #2 sacrum 2 x 1 x 2 cm 100% red wound bed Right ischium 2.5 x 3.5 cm 100% yellow slough Left lower leg 1 x 1 x 0.1 cm yellow Right posterior leg patchy total area: 8 x 9 cm superifical red wound beds   Psychiatric: She has a normal mood and affect.      ASSESSMENT/ PLAN:  1. Stage IV wounds: will continue treatment per facility protocol will continue ceftriaxone 2 gm and vancomycin IV for 14 more days will monitor her status.   2. MS: will continue baclofen 5 mg three times daily for spasticity is followed by neurology. Has had several exacerbations this past year. Takes vicodin 5/325 mg 2 tabs every 4 hours as needed for pain   3. Hypertension: will continue coreg 3.125 mg twice daily and asa 81 mg daily   4. Hypokalemia: will continue k+ 10 meq daily     Time spent with patient 50   minutes >50% time spent counseling; reviewing medical record; tests; labs; and developing future plan of care     Ok Edwards NP [facility]  Contact [phone] Monday through Friday 8am- 5pm  After hours call [phone]

## 2015-06-23 ENCOUNTER — Non-Acute Institutional Stay (SKILLED_NURSING_FACILITY): Payer: Medicare Other | Admitting: Adult Health

## 2015-06-23 DIAGNOSIS — L89154 Pressure ulcer of sacral region, stage 4: Secondary | ICD-10-CM | POA: Diagnosis not present

## 2015-06-23 DIAGNOSIS — I739 Peripheral vascular disease, unspecified: Secondary | ICD-10-CM

## 2015-06-23 DIAGNOSIS — G35 Multiple sclerosis: Secondary | ICD-10-CM | POA: Diagnosis not present

## 2015-06-23 DIAGNOSIS — E876 Hypokalemia: Secondary | ICD-10-CM

## 2015-06-23 DIAGNOSIS — I10 Essential (primary) hypertension: Secondary | ICD-10-CM | POA: Diagnosis not present

## 2015-07-04 ENCOUNTER — Encounter: Payer: Self-pay | Admitting: Adult Health

## 2015-07-04 DIAGNOSIS — I739 Peripheral vascular disease, unspecified: Secondary | ICD-10-CM | POA: Insufficient documentation

## 2015-07-04 NOTE — Progress Notes (Signed)
Patient ID: Jocelyn Sanchez, female   DOB: [birth year], 60 y.o.   MRN: [id number]   Facility: [facility]       Allergies  Allergen Reactions  . Sulfa Antibiotics Shortness Of Breath and Swelling    Chief Complaint  Patient presents with  . Medical Management of Chronic Issues    HPI:  She is a resident of this facility being seen for the management of her chronic illnesses. Her status remains without significant change. She is not voicing any complaints or concerns at this time. I have been asked to review her wounds at this time. He has a sacral pressure ulcer and a right lower leg vascular ulceration. There are no indications of infection present.    Past Medical History  Diagnosis Date  . MS (multiple sclerosis) ([place])   . HTN (hypertension)   . DM II (diabetes mellitus, type II), controlled ([place])   . Lymphedema   . Anemia   . Gait disorder     Past Surgical History  Procedure Laterality Date  . Flexible sigmoidoscopy Left 03/09/2015    Procedure: FLEXIBLE SIGMOIDOSCOPY;  Surgeon: Carol Ada, MD;  Location: [facility];  Service: Endoscopy;  Laterality: Left;    VITAL SIGNS BP 145/83 mmHg  Pulse 89  Ht 5\' 3"  (1.6 m)  Wt 141 lb (63.957 kg)  BMI 24.98 kg/m2  Patient's Medications  New Prescriptions   No medications on file  Previous Medications   ACETAMINOPHEN (TYLENOL) 325 MG TABLET    Take 2 tablets (650 mg total) by mouth every 6 (six) hours as needed for mild pain (or Fever >/= 101).   ASPIRIN EC 81 MG EC TABLET    Take 1 tablet (81 mg total) by mouth daily.   BACLOFEN (LIORESAL) 10 MG TABLET    Take 0.5 tablets (5 mg total) by mouth 3 (three) times daily.   CARVEDILOL (COREG) 3.125 MG TABLET    Take 3.125 mg by mouth 2 (two) times daily.   DIPHENHYDRAMINE-ACETAMINOPHEN (TYLENOL PM) 25-500 MG TABS TABLET    Take 1 tablet by mouth at bedtime as needed (for sleep).   DOCUSATE SODIUM (COLACE) 100 MG CAPSULE    Take 1 capsule (100 mg total) by mouth 2 (two)  times daily.   FEEDING SUPPLEMENT, GLUCERNA SHAKE, (GLUCERNA SHAKE) LIQD    Take 237 mLs by mouth 2 (two) times daily between meals.   FERROUS SULFATE 325 (65 FE) MG TABLET    Take 1 tablet (325 mg total) by mouth daily with breakfast.   GLIPIZIDE (GLUCOTROL) 5 MG TABLET    Take 0.5 tablets by mouth 2 (two) times daily.   HEPARIN 5000 UNIT/ML INJECTION    Inject 1 mL (5,000 Units total) into the skin every 8 (eight) hours.   HYDROCODONE-ACETAMINOPHEN (NORCO/VICODIN) 5-325 MG PER TABLET    Take 2 tablets by mouth every 4 (four) hours as needed for moderate pain.   METHOCARBAMOL (ROBAXIN) 500 MG TABLET    Take 0.5 tablets (250 mg total) by mouth every 8 (eight) hours as needed for muscle spasms.   MULTIPLE VITAMIN (MULTIVITAMIN WITH MINERALS) TABS TABLET    Take 1 tablet by mouth daily.   POTASSIUM CHLORIDE 20 MEQ/15ML (10%) SOLN    Take 7.5 mLs (10 mEq total) by mouth daily.   VITAMIN C (VITAMIN C) 500 MG TABLET    Take 1 tablet (500 mg total) by mouth daily.   ZOLPIDEM (AMBIEN) 5 MG TABLET    Take 1 tablet (5  mg total) by mouth at bedtime as needed for sleep (insomnia).  Modified Medications   No medications on file  Discontinued Medications     SIGNIFICANT DIAGNOSTIC EXAMS     LABS REVIEWED:   05-10-15: wbc 22.;4 hgb 8.9; hct 28.1; mcv 81.0; plt 565; glucose 133; bun 17; creat 0.46; k+ 3.8; na++135; liver normal albumin 2.2 04-21-15: wbc 9.2; hgb 8.2; hct 26.6; mcv 80.6; plt 545; glucose 35; bun 5; creat <0.30; k+ 3.4; na++136 05-14-15: wbc 9.9; hgb 8.6; hct 28.1; mcv 81.4; plt 539; glucose 88; bun 5; creat <0.3; k+ 4.1; na++136 05-23-15: glucose 84; bun 5; creat <0.3; k+ 3.7; na++143 05-24-15: glucose 126; bun 9; creat <0.30; k+ 3.6; na++140    Review of Systems  Constitutional: Negative for malaise/fatigue.  Respiratory: Negative for cough and shortness of breath.   Cardiovascular: Negative for chest pain, palpitations and leg swelling.  Gastrointestinal: Negative for heartburn,  abdominal pain and constipation.  Genitourinary:       Has foley   Musculoskeletal: Negative for myalgias and joint pain.  Skin:       Has skin ulcers present   Psychiatric/Behavioral: The patient is not nervous/anxious.      Physical Exam  Constitutional: No distress.  Eyes: Conjunctivae are normal.  Neck: Neck supple. No JVD present. No thyromegaly present.  Cardiovascular: Normal rate, regular rhythm and intact distal pulses.   Respiratory: Effort normal and breath sounds normal. No respiratory distress. She has no wheezes.  GI: Soft. Bowel sounds are normal. She exhibits no distension. There is no tenderness.  Genitourinary:  Has foley   Musculoskeletal: left ankle edema.  Does not move lower extremities    Lymphadenopathy:    She has no cervical adenopathy.  Neurological: She is alert.  Skin: Skin is warm and dry. She is not diaphoretic. 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    Psychiatric: She has a normal mood and affect.      ASSESSMENT/ PLAN:  1. Stage IV sacral  wound: we had prolonged discussion and she has agreed to begin a wound vac to the sacral wound and will monitor    2. MS: will continue baclofen 5 mg three times daily for spasticity is followed by neurology. Has had several exacerbations this past year. Takes vicodin 5/325 mg 2 tabs every 4 hours as needed for pain will continue robaxin 250 mg every 8 hours as needed    3. Hypertension: will continue coreg 3.125 mg twice daily and asa 81 mg daily   4. Hypokalemia: will continue k+ 10 meq daily   5. Constipation: will continue colace twice daily     Will check cbc; cmp      Ok Edwards NP [facility]  Contact [phone] Monday through Friday 8am- 5pm  After hours call [phone]

## 2015-07-19 ENCOUNTER — Non-Acute Institutional Stay (SKILLED_NURSING_FACILITY): Payer: Medicare Other | Admitting: Adult Health

## 2015-07-19 DIAGNOSIS — E1151 Type 2 diabetes mellitus with diabetic peripheral angiopathy without gangrene: Secondary | ICD-10-CM

## 2015-07-19 DIAGNOSIS — L89154 Pressure ulcer of sacral region, stage 4: Secondary | ICD-10-CM | POA: Diagnosis not present

## 2015-07-19 DIAGNOSIS — G35 Multiple sclerosis: Secondary | ICD-10-CM

## 2015-07-26 ENCOUNTER — Emergency Department (HOSPITAL_COMMUNITY): Payer: Medicare Other

## 2015-07-26 ENCOUNTER — Encounter (HOSPITAL_COMMUNITY): Payer: Self-pay | Admitting: Emergency Medicine

## 2015-07-26 ENCOUNTER — Emergency Department (HOSPITAL_COMMUNITY)
Admission: EM | Admit: 2015-07-26 | Discharge: 2015-07-26 | Disposition: A | Discharge status: Home / Self Care | Payer: Medicare Other | Attending: Emergency Medicine | Admitting: Emergency Medicine

## 2015-07-26 DIAGNOSIS — Z87891 Personal history of nicotine dependence: Secondary | ICD-10-CM | POA: Insufficient documentation

## 2015-07-26 DIAGNOSIS — I10 Essential (primary) hypertension: Secondary | ICD-10-CM | POA: Insufficient documentation

## 2015-07-26 DIAGNOSIS — E119 Type 2 diabetes mellitus without complications: Secondary | ICD-10-CM | POA: Diagnosis not present

## 2015-07-26 DIAGNOSIS — G8220 Paraplegia, unspecified: Secondary | ICD-10-CM | POA: Diagnosis not present

## 2015-07-26 DIAGNOSIS — R600 Localized edema: Secondary | ICD-10-CM | POA: Insufficient documentation

## 2015-07-26 DIAGNOSIS — J3489 Other specified disorders of nose and nasal sinuses: Secondary | ICD-10-CM | POA: Insufficient documentation

## 2015-07-26 DIAGNOSIS — R0981 Nasal congestion: Secondary | ICD-10-CM | POA: Insufficient documentation

## 2015-07-26 DIAGNOSIS — R05 Cough: Secondary | ICD-10-CM | POA: Diagnosis not present

## 2015-07-26 DIAGNOSIS — N390 Urinary tract infection, site not specified: Secondary | ICD-10-CM | POA: Insufficient documentation

## 2015-07-26 DIAGNOSIS — Z79899 Other long term (current) drug therapy: Secondary | ICD-10-CM | POA: Insufficient documentation

## 2015-07-26 DIAGNOSIS — T839XXA Unspecified complication of genitourinary prosthetic device, implant and graft, initial encounter: Secondary | ICD-10-CM

## 2015-07-26 DIAGNOSIS — R0982 Postnasal drip: Secondary | ICD-10-CM | POA: Insufficient documentation

## 2015-07-26 DIAGNOSIS — Y828 Other medical devices associated with adverse incidents: Secondary | ICD-10-CM | POA: Diagnosis not present

## 2015-07-26 DIAGNOSIS — T83098A Other mechanical complication of other indwelling urethral catheter, initial encounter: Secondary | ICD-10-CM | POA: Insufficient documentation

## 2015-07-26 DIAGNOSIS — Z7982 Long term (current) use of aspirin: Secondary | ICD-10-CM | POA: Insufficient documentation

## 2015-07-26 DIAGNOSIS — R000 Tachycardia, unspecified: Secondary | ICD-10-CM | POA: Insufficient documentation

## 2015-07-26 LAB — URINE MICROSCOPIC-ADD ON

## 2015-07-26 LAB — URINALYSIS, ROUTINE W REFLEX MICROSCOPIC
BILIRUBIN URINE: NEGATIVE
GLUCOSE, UA: NEGATIVE mg/dL
Ketones, ur: NEGATIVE mg/dL
NITRITE: POSITIVE — AB
PH: 7 (ref 5.0–8.0)
Protein, ur: 100 mg/dL — AB
SPECIFIC GRAVITY, URINE: 1.021 (ref 1.005–1.030)

## 2015-07-26 LAB — CBG MONITORING, ED: Glucose-Capillary: 168 mg/dL — ABNORMAL HIGH (ref 65–99)

## 2015-07-26 MED ORDER — CIPROFLOXACIN HCL 500 MG PO TABS
500.0000 mg | ORAL_TABLET | Freq: Once | ORAL | Status: AC
  Administered 2015-07-26: 500 mg via ORAL
  Filled 2015-07-26: qty 1

## 2015-07-26 MED ORDER — CIPROFLOXACIN HCL 500 MG PO TABS: 500.0000 mg | ORAL_TABLET | Freq: Two times a day (BID) | ORAL | Status: DC

## 2015-07-26 NOTE — ED Provider Notes (Signed)
CSN: [id number]     Arrival date & time 07/26/15  1456 History   First MD Initiated Contact with Patient 07/26/15 1502     Chief Complaint  Patient presents with  . foley catheter problem      (Consider location/radiation/quality/duration/timing/severity/associated sxs/prior Treatment) HPI Comments: Patient history of MS with lower extremity paralysis, nonunion material, also history of diabetes and hypertension presents with a Foley catheter problem. She states she's had an indwelling Foley catheter for the last month. She was previously in a rehabilitation facility. She states she had a Foley catheter placed due to a pressure ulcer on her buttocks. She's had the catheter in for about a month. It has not been changed out. She states this morning she felt like the catheter was dislodged that she had a large amount of urine on her clothing. She states currently she feels like it's draining appropriately. She denies any fevers. No nausea or vomiting. No abdominal pain. She's had some mild URI symptoms with a cough. She is noted to be tachycardic and she states that she frequently has elevated heart rates. She states she is on a medication for her heart rate which appears to be Corag. She states she did not take her medications this morning as she was worried about her Foley catheter. She has been treated for a pressure sore in her sacral area. She states that it is almost closed up and she is not having any problems with this currently.   Past Medical History  Diagnosis Date  . MS (multiple sclerosis) ([place])   . HTN (hypertension)   . DM II (diabetes mellitus, type II), controlled ([place])   . Lymphedema   . Anemia   . Gait disorder    Past Surgical History  Procedure Laterality Date  . Flexible sigmoidoscopy Left 03/09/2015    Procedure: FLEXIBLE SIGMOIDOSCOPY;  Surgeon: Carol Ada, MD;  Location: [facility];  Service: Endoscopy;  Laterality: Left;   Family History  Problem Relation Age of  Onset  . Multiple sclerosis Mother    Social History  Substance Use Topics  . Smoking status: Former Smoker    Types: Cigarettes  . Smokeless tobacco: Never Used  . Alcohol Use: No   OB History    Gravida Para Term Preterm AB TAB SAB Ectopic Multiple Living   3 3 0 0 0 0 0 0 0 0      Review of Systems  Constitutional: Negative for fever, chills, diaphoresis and fatigue.  HENT: Positive for congestion, postnasal drip and rhinorrhea. Negative for sneezing.   Eyes: Negative.   Respiratory: Positive for cough. Negative for chest tightness and shortness of breath.   Cardiovascular: Negative for chest pain and leg swelling.  Gastrointestinal: Negative for nausea, vomiting, abdominal pain, diarrhea and blood in stool.  Genitourinary: Negative for frequency, hematuria, flank pain and difficulty urinating.  Musculoskeletal: Negative for back pain and arthralgias.  Skin: Negative for rash.  Neurological: Negative for dizziness, speech difficulty, weakness, numbness and headaches.      Allergies  Sulfa antibiotics  Home Medications   Prior to Admission medications   Medication Sig Start Date End Date Taking? Authorizing Provider  acetaminophen (TYLENOL) 325 MG tablet Take 2 tablets (650 mg total) by mouth every 6 (six) hours as needed for mild pain (or Fever >/= 101). 03/12/15  Yes Robbie Lis, MD  aspirin EC 81 MG EC tablet Take 1 tablet (81 mg total) by mouth daily. 12/04/14  Yes Dixie Dials, MD  baclofen (  LIORESAL) 10 MG tablet Take 0.5 tablets (5 mg total) by mouth 3 (three) times daily. 05/14/13  Yes Dennie Bible, NP  carvedilol (COREG) 3.125 MG tablet Take 3.125 mg by mouth 2 (two) times daily. 04/25/15  Yes Historical Provider, MD  diphenhydramine-acetaminophen (TYLENOL PM) 25-500 MG TABS tablet Take 1 tablet by mouth at bedtime as needed (for sleep).   Yes Historical Provider, MD  docusate sodium (COLACE) 100 MG capsule Take 1 capsule (100 mg total) by mouth 2 (two)  times daily. 05/13/15  Yes Dixie Dials, MD  feeding supplement, GLUCERNA SHAKE, (GLUCERNA SHAKE) LIQD Take 237 mLs by mouth 2 (two) times daily between meals. 05/13/15  Yes Dixie Dials, MD  ferrous sulfate 325 (65 FE) MG tablet Take 1 tablet (325 mg total) by mouth daily with breakfast. 05/13/15  Yes Dixie Dials, MD  glipiZIDE (GLUCOTROL) 5 MG tablet Take 0.5 tablets by mouth 2 (two) times daily. 04/19/15  Yes Historical Provider, MD  HYDROcodone-acetaminophen (NORCO/VICODIN) 5-325 MG per tablet Take 2 tablets by mouth every 4 (four) hours as needed for moderate pain. 03/12/15  Yes Robbie Lis, MD  methocarbamol (ROBAXIN) 500 MG tablet Take 0.5 tablets (250 mg total) by mouth every 8 (eight) hours as needed for muscle spasms. 03/12/15  Yes Robbie Lis, MD  Multiple Vitamin (MULTIVITAMIN WITH MINERALS) TABS tablet Take 1 tablet by mouth daily. 05/13/15  Yes Dixie Dials, MD  potassium chloride 20 MEQ/15ML (10%) SOLN Take 7.5 mLs (10 mEq total) by mouth daily. 05/13/15  Yes Dixie Dials, MD  SANTYL ointment Apply 1 application topically 2 (two) times daily. 04/21/15  Yes Historical Provider, MD  vitamin C (VITAMIN C) 500 MG tablet Take 1 tablet (500 mg total) by mouth daily. 05/13/15  Yes Dixie Dials, MD  ciprofloxacin (CIPRO) 500 MG tablet Take 1 tablet (500 mg total) by mouth 2 (two) times daily. One po bid x 7 days 07/26/15   Malvin Johns, MD  heparin 5000 UNIT/ML injection Inject 1 mL (5,000 Units total) into the skin every 8 (eight) hours. Patient not taking: Reported on 07/26/2015 05/13/15   Dixie Dials, MD  zolpidem (AMBIEN) 5 MG tablet Take 1 tablet (5 mg total) by mouth at bedtime as needed for sleep (insomnia). 05/18/15   Estill Dooms, MD   BP 147/73 mmHg  Pulse 94  Temp(Src) 98.4 F (36.9 C) (Oral)  Resp 18  SpO2 98% Physical Exam  Constitutional: She is oriented to person, place, and time. She appears well-developed and well-nourished.  HENT:  Head: Normocephalic and  atraumatic.  Eyes: Pupils are equal, round, and reactive to light.  Neck: Normal range of motion. Neck supple.  Cardiovascular: Regular rhythm and normal heart sounds.  Tachycardia present.   Pulmonary/Chest: Effort normal and breath sounds normal. No respiratory distress. She has no wheezes. She has no rales. She exhibits no tenderness.  Abdominal: Soft. Bowel sounds are normal. There is no tenderness. There is no rebound and no guarding.  Musculoskeletal: Normal range of motion. She exhibits edema.  3+bilateral pitting edema  Lymphadenopathy:    She has no cervical adenopathy.  Neurological: She is alert and oriented to person, place, and time.  Paralysis of lower extremities.  Skin: Skin is warm and dry. No rash noted.  Psychiatric: She has a normal mood and affect.    ED Course  Procedures (including critical care time) Labs Review Labs Reviewed  URINALYSIS, ROUTINE W REFLEX MICROSCOPIC (NOT AT [facility]) - Abnormal; Notable for the following:  APPearance TURBID (*)    Hgb urine dipstick LARGE (*)    Protein, ur 100 (*)    Nitrite POSITIVE (*)    Leukocytes, UA LARGE (*)    All other components within normal limits  URINE MICROSCOPIC-ADD ON - Abnormal; Notable for the following:    Squamous Epithelial / LPF 6-30 (*)    Bacteria, UA MANY (*)    Casts HYALINE CASTS (*)    All other components within normal limits  CBG MONITORING, ED - Abnormal; Notable for the following:    Glucose-Capillary 168 (*)    All other components within normal limits  URINE CULTURE    Imaging Review Dg Chest 2 View  07/26/2015  CLINICAL DATA:  Dry cough for 2 days. History of multiple sclerosis. EXAM: CHEST  2 VIEW COMPARISON:  Chest x-ray dated 03/07/2015. FINDINGS: Cardiomegaly is unchanged, likely accentuated to some degree by the slightly oblique and semi-erect patient positioning. Lungs are clear. Lung volumes are grossly normal. No pleural effusion seen. No pneumothorax seen. Osseous and soft  tissue structures about the chest are unremarkable. IMPRESSION: No acute findings.  No evidence of pneumonia. Electronically Signed   By: Franki Cabot M.D.   On: 07/26/2015 16:23   I have personally reviewed and evaluated these images and lab results as part of my medical decision-making.   EKG Interpretation None      MDM   Final diagnoses:  Foley catheter problem, initial encounter [facility])  UTI (lower urinary tract infection)    Patient's Foley catheter was changed out here. There is clear urine returned. She does have evidence of a UTI. Was sent for culture and she was started on Cipro. She does not appear systemically ill. She is discharged back to the skilled nursing facility.    Malvin Johns, MD 07/27/15 0002

## 2015-07-26 NOTE — Discharge Instructions (Signed)

## 2015-07-26 NOTE — ED Notes (Signed)
Bed: [id number] Expected date:  Expected time:  Means of arrival:  Comments: EMS- 60yo F, displaced foley/nonambulatory

## 2015-07-26 NOTE — ED Notes (Addendum)
Pt reports she has had a foley catheter for the past month to help keep a wound in between buttocks. Pt was in a rehab facility for this up until Thursday when she was discharged. Pt sent home with foley catheter. Today at 1100 pt had the urge to urinate and urinated around her catheter. However during EMS transport pt's foley began to spontaneously drain again. Pt reports her wound is just about closed. Denies dysuria or pain.

## 2015-07-26 NOTE — ED Notes (Signed)
[facility] has been called  

## 2015-07-28 LAB — URINE CULTURE: Special Requests: NORMAL

## 2015-07-29 ENCOUNTER — Telehealth (HOSPITAL_COMMUNITY): Payer: Self-pay

## 2015-07-29 DIAGNOSIS — G35 Multiple sclerosis: Secondary | ICD-10-CM | POA: Diagnosis not present

## 2015-07-29 DIAGNOSIS — L97219 Non-pressure chronic ulcer of right calf with unspecified severity: Secondary | ICD-10-CM | POA: Diagnosis not present

## 2015-07-29 DIAGNOSIS — L89154 Pressure ulcer of sacral region, stage 4: Secondary | ICD-10-CM | POA: Diagnosis not present

## 2015-07-29 DIAGNOSIS — L89312 Pressure ulcer of right buttock, stage 2: Secondary | ICD-10-CM | POA: Diagnosis not present

## 2015-07-29 NOTE — Progress Notes (Signed)
ED Antimicrobial Stewardship Positive Culture Follow Up   Jocelyn Sanchez is an 60 y.o. female who presented to [facility] on 07/26/2015 with a chief complaint of  Chief Complaint  Patient presents with  . foley catheter problem     Recent Results (from the past 720 hour(s))  Urine culture     Status: None   Collection Time: 07/26/15  6:00 PM  Result Value Ref Range Status   Specimen Description URINE, CATHETERIZED  Final   Special Requests Normal  Final   Culture   Final    >=100,000 COLONIES/mL ESCHERICHIA COLI Confirmed Extended Spectrum Beta-Lactamase Producer (ESBL) Performed at [facility]    Report Status 07/28/2015 FINAL  Final   Organism ID, Bacteria ESCHERICHIA COLI  Final      Susceptibility   Escherichia coli - MIC*    AMPICILLIN >=32 RESISTANT Resistant     CEFAZOLIN >=64 RESISTANT Resistant     CEFTRIAXONE >=64 RESISTANT Resistant     CIPROFLOXACIN >=4 RESISTANT Resistant     GENTAMICIN <=1 SENSITIVE Sensitive     IMIPENEM <=0.25 SENSITIVE Sensitive     NITROFURANTOIN <=16 SENSITIVE Sensitive     TRIMETH/SULFA <=20 SENSITIVE Sensitive     AMPICILLIN/SULBACTAM >=32 RESISTANT Resistant     PIP/TAZO 64 INTERMEDIATE Intermediate     * >=100,000 COLONIES/mL ESCHERICHIA COLI    [x]  Treated with Cipro, organism resistant to prescribed antimicrobial  New antibiotic prescription: Nitrofurantoin 100mg  PO BID x 7 days  ED Provider: Monico Blitz PA-C  [name] 07/29/2015, 8:52 AM Infectious Diseases Pharmacist Phone# [phone]

## 2015-07-29 NOTE — Telephone Encounter (Signed)
Post ED Visit - Positive Culture Follow-up: Chart Hand-off to ED Flow Manager  Culture assessed and recommendations reviewed by: [x]  Levester Fresh, Pharm.D., BCPS []  Heide Guile, Pharm.D., BCPS-AQ ID []  Alycia Rossetti, Pharm.D., BCPS []  Stuart, Pharm.D., BCPS, AAHIVP []  Legrand Como, Pharm .D., BCPS, AAHIVP []  Milus Glazier, Pharm.D. []  Dimitri Ped, Pharm.D.  Positive urine culture  []  Patient discharged without antimicrobial prescription and treatment is now indicated [x]  Organism is resistant to prescribed ED discharge antimicrobial []  Patient with positive blood cultures  Changes discussed with ED provider: Monico Blitz  New antibiotic prescription Stop cipro and start Macrobid 100 mg po BID x 7 days.  Pt informed. Med called to [facility] on [address]. Left on MD voice mail   Ileene Musa 07/29/2015, 9:58 AM

## 2015-08-17 ENCOUNTER — Other Ambulatory Visit: Payer: Self-pay | Admitting: Adult Health

## 2015-08-30 ENCOUNTER — Encounter: Payer: Self-pay | Admitting: Adult Health

## 2015-08-30 NOTE — Progress Notes (Signed)
Patient ID: Jocelyn Sanchez, female   DOB: 03-08-1956, 60 y.o.   MRN: [id number]   Facility: [facility]       Allergies  Allergen Reactions  . Sulfa Antibiotics Shortness Of Breath and Swelling    Chief Complaint  Patient presents with  . Discharge Note    HPI:  She is being discharged to home with home health for rn for wound care. She will not need any dme; she has al necessary equipment at home. She will need her prescriptions to be written and will need to follow up with her pcp.    Past Medical History  Diagnosis Date  . MS (multiple sclerosis) ([place])   . HTN (hypertension)   . DM II (diabetes mellitus, type II), controlled ([place])   . Lymphedema   . Anemia   . Gait disorder     Past Surgical History  Procedure Laterality Date  . Flexible sigmoidoscopy Left 03/09/2015    Procedure: FLEXIBLE SIGMOIDOSCOPY;  Surgeon: Carol Ada, MD;  Location: [facility];  Service: Endoscopy;  Laterality: Left;    VITAL SIGNS BP 148/68 mmHg  Pulse 100  Ht 5\' 3"  (1.6 m)  Wt 141 lb (63.957 kg)  BMI 24.98 kg/m2  Patient's Medications  New Prescriptions   No medications on file  Previous Medications   ACETAMINOPHEN (TYLENOL) 325 MG TABLET    Take 2 tablets (650 mg total) by mouth every 6 (six) hours as needed for mild pain (or Fever >/= 101).   ASPIRIN EC 81 MG EC TABLET    Take 1 tablet (81 mg total) by mouth daily.   BACLOFEN (LIORESAL) 10 MG TABLET    Take 0.5 tablets (5 mg total) by mouth 3 (three) times daily.   CARVEDILOL (COREG) 3.125 MG TABLET    Take 3.125 mg by mouth 2 (two) times daily.   CIPROFLOXACIN (CIPRO) 500 MG TABLET    Take 1 tablet (500 mg total) by mouth 2 (two) times daily. One po bid x 7 days   DIPHENHYDRAMINE-ACETAMINOPHEN (TYLENOL PM) 25-500 MG TABS TABLET    Take 1 tablet by mouth at bedtime as needed (for sleep).   DOCUSATE SODIUM (COLACE) 100 MG CAPSULE    Take 1 capsule (100 mg total) by mouth 2 (two) times daily.   FEEDING SUPPLEMENT, GLUCERNA  SHAKE, (GLUCERNA SHAKE) LIQD    Take 237 mLs by mouth 2 (two) times daily between meals.   FERROUS SULFATE 325 (65 FE) MG TABLET    Take 1 tablet (325 mg total) by mouth daily with breakfast.   GLIPIZIDE (GLUCOTROL) 5 MG TABLET    Take 0.5 tablets by mouth 2 (two) times daily.   HYDROCODONE-ACETAMINOPHEN (NORCO/VICODIN) 5-325 MG PER TABLET    Take 2 tablets by mouth every 4 (four) hours as needed for moderate pain.   METHOCARBAMOL (ROBAXIN) 500 MG TABLET    Take 0.5 tablets (250 mg total) by mouth every 8 (eight) hours as needed for muscle spasms.   MULTIPLE VITAMIN (MULTIVITAMIN WITH MINERALS) TABS TABLET    Take 1 tablet by mouth daily.   POTASSIUM CHLORIDE 20 MEQ/15ML (10%) SOLN    Take 7.5 mLs (10 mEq total) by mouth daily.   SANTYL OINTMENT    Apply 1 application topically 2 (two) times daily.   VITAMIN C (VITAMIN C) 500 MG TABLET    Take 1 tablet (500 mg total) by mouth daily.   ZOLPIDEM (AMBIEN) 5 MG TABLET    Take 1 tablet (5 mg total)  by mouth at bedtime as needed for sleep (insomnia).  Modified Medications   No medications on file  Discontinued Medications   No medications on file     SIGNIFICANT DIAGNOSTIC EXAMS     LABS REVIEWED:   05-10-15: wbc 22.;4 hgb 8.9; hct 28.1; mcv 81.0; plt 565; glucose 133; bun 17; creat 0.46; k+ 3.8; na++135; liver normal albumin 2.2 04-21-15: wbc 9.2; hgb 8.2; hct 26.6; mcv 80.6; plt 545; glucose 35; bun 5; creat <0.30; k+ 3.4; na++136 05-14-15: wbc 9.9; hgb 8.6; hct 28.1; mcv 81.4; plt 539; glucose 88; bun 5; creat <0.3; k+ 4.1; na++136 05-23-15: glucose 84; bun 5; creat <0.3; k+ 3.7; na++143 05-24-15: glucose 126; bun 9; creat <0.30; k+ 3.6; na++140    Review of Systems  Constitutional: Negative for malaise/fatigue.  Respiratory: Negative for cough and shortness of breath.   Cardiovascular: Negative for chest pain, palpitations and leg swelling.  Gastrointestinal: Negative for heartburn, abdominal pain and constipation.  Genitourinary:        Has foley   Musculoskeletal: Negative for myalgias and joint pain.  Skin:       Has skin ulcers present   Psychiatric/Behavioral: The patient is not nervous/anxious.      Physical Exam  Constitutional: No distress.  Eyes: Conjunctivae are normal.  Neck: Neck supple. No JVD present. No thyromegaly present.  Cardiovascular: Normal rate, regular rhythm and intact distal pulses.   Respiratory: Effort normal and breath sounds normal. No respiratory distress. She has no wheezes.  GI: Soft. Bowel sounds are normal. She exhibits no distension. There is no tenderness.  Genitourinary:  Has foley   Musculoskeletal: left ankle edema.  Does not move lower extremities    Lymphadenopathy:    She has no cervical adenopathy.  Neurological: She is alert.  Skin: Skin is warm and dry. She is not diaphoretic. 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    Psychiatric: She has a normal mood and affect.     ASSESSMENT/ PLAN  Will discharge to home with home health for rn to evaluate and treat for wound care. She will not need dme. Her prescriptions were written for a 30 day supply of her medications with #30 vicodin 5/325 mg tabs and #30 ambien 5 mg tabs. The facility is to setup a follow up appointment for the next 1-2 weeks.     Time spent with patient  35  minutes >50% time spent counseling; reviewing medical record; tests; labs; and developing future plan of care    Ok Edwards NP [facility]  Contact [phone] Monday through Friday 8am- 5pm  After hours call [phone]

## 2015-09-20 ENCOUNTER — Encounter: Payer: Self-pay | Admitting: Podiatry

## 2015-09-20 ENCOUNTER — Ambulatory Visit (INDEPENDENT_AMBULATORY_CARE_PROVIDER_SITE_OTHER): Payer: Medicare Other | Admitting: Podiatry

## 2015-09-20 VITALS — Temp 97.3°F

## 2015-09-20 DIAGNOSIS — L600 Ingrowing nail: Secondary | ICD-10-CM

## 2015-09-20 DIAGNOSIS — M79676 Pain in unspecified toe(s): Secondary | ICD-10-CM

## 2015-09-20 DIAGNOSIS — B351 Tinea unguium: Secondary | ICD-10-CM | POA: Diagnosis not present

## 2015-09-20 MED ORDER — AMOXICILLIN-POT CLAVULANATE 875-125 MG PO TABS: 1.0000 | ORAL_TABLET | Freq: Two times a day (BID) | ORAL | Status: DC

## 2015-09-20 NOTE — Progress Notes (Signed)
   Subjective:    Patient ID: Jocelyn Sanchez, female    DOB: 1955/09/29, 60 y.o.   MRN: [id number]  HPI  60 year old female presents the office today for concerns of ingrown toenail to left big toe. 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 She denies any pain to the ingrown toenail area. She also states her nails are elongated, thick, painful she cannot trim them herself. She states her nails are painful particularly pressure in shoes. No other complaints this time.   Review of Systems  All other systems reviewed and are negative.      Objective:   Physical Exam General: AAO x3, NAD  Dermatological: Along the lateral border left hallux toenail is granulation tissue present within the nail bed. There is a spicule of nail present to separate from the main toenail within the nail border. There is no pus expressed. There is localized edema without any erythema, increase in warmth. The remainder the nails appear to be hypertrophic, dystrophic, brittle, discolored, elongated 10. No swelling erythema or drainage. Subjectively there is tenderness to nails 1-5 bilaterally.  Vascular: Dorsalis Pedis artery and Posterior Tibial artery pedal pulses are 1/4 bilateral with immedate capillary fill time. Pedal hair growth present. No varicosities and no lower extremity edema present bilateral. There is no pain with calf compression, swelling, warmth, erythema.   Neruologic: Sensation decreased with Derrel Nip monofilament.  Musculoskeletal: No gross boney pedal deformities bilateral. No pain, crepitus, or limitation noted with foot and ankle range of motion bilateral. Muscular strength 5/5 in all groups tested bilateral.  Gait: Unassisted, Nonantalgic.      Assessment & Plan:  60 year old female left hallux ingrown toenail, symptomatic onychomycosis -Treatment options discussed including all alternatives, risks, and complications -Etiology of symptoms were discussed -Nail  borders left toenail was debrided to remove the spicule of nail. The area was debrided. Given history of PVD and decreased pulses with hold off on nail avulsion of possible. We'll start antibiotics. Neosporin and a bandage daily. Discussed with her that given PVD and malaise infection this can progress and unfortunately lead to amputation. Described Augmentin -Remainder the nails debrided without complications or bleeding 10. -Daily foot inspection. -Monitor for any clinical signs or symptoms of infection and directed to call the office immediately should any occur or go to the ER. -Follow-up in 1 week or sooner if any problems arise. In the meantime, encouraged to call the office with any questions, concerns, change in symptoms.   Celesta Gentile, DPM

## 2015-09-20 NOTE — Patient Instructions (Signed)
Clean left big toe with antibiotic ointment and a bandage. Cover with neosporin and a bandage. Start antibiotics Monitor for any signs/symptoms of infection. Call the office immediately if any occur or go directly to the emergency room. Call with any questions/concerns.

## 2015-09-24 ENCOUNTER — Telehealth: Payer: Self-pay | Admitting: *Deleted

## 2015-09-24 ENCOUNTER — Emergency Department (HOSPITAL_COMMUNITY): Payer: Medicare Other

## 2015-09-24 ENCOUNTER — Emergency Department (HOSPITAL_COMMUNITY)
Admission: EM | Admit: 2015-09-24 | Discharge: 2015-09-24 | Disposition: A | Discharge status: Home / Self Care | Payer: Medicare Other | Attending: Emergency Medicine | Admitting: Emergency Medicine

## 2015-09-24 ENCOUNTER — Encounter (HOSPITAL_COMMUNITY): Payer: Self-pay | Admitting: Emergency Medicine

## 2015-09-24 DIAGNOSIS — D649 Anemia, unspecified: Secondary | ICD-10-CM | POA: Diagnosis not present

## 2015-09-24 DIAGNOSIS — Z79899 Other long term (current) drug therapy: Secondary | ICD-10-CM | POA: Insufficient documentation

## 2015-09-24 DIAGNOSIS — R0600 Dyspnea, unspecified: Secondary | ICD-10-CM | POA: Diagnosis not present

## 2015-09-24 DIAGNOSIS — Z87891 Personal history of nicotine dependence: Secondary | ICD-10-CM | POA: Diagnosis not present

## 2015-09-24 DIAGNOSIS — I10 Essential (primary) hypertension: Secondary | ICD-10-CM | POA: Diagnosis not present

## 2015-09-24 DIAGNOSIS — E119 Type 2 diabetes mellitus without complications: Secondary | ICD-10-CM | POA: Diagnosis not present

## 2015-09-24 DIAGNOSIS — Z7982 Long term (current) use of aspirin: Secondary | ICD-10-CM | POA: Insufficient documentation

## 2015-09-24 DIAGNOSIS — Z7984 Long term (current) use of oral hypoglycemic drugs: Secondary | ICD-10-CM | POA: Insufficient documentation

## 2015-09-24 DIAGNOSIS — Z8669 Personal history of other diseases of the nervous system and sense organs: Secondary | ICD-10-CM | POA: Insufficient documentation

## 2015-09-24 DIAGNOSIS — R0602 Shortness of breath: Secondary | ICD-10-CM | POA: Diagnosis present

## 2015-09-24 DIAGNOSIS — G35 Multiple sclerosis: Secondary | ICD-10-CM

## 2015-09-24 DIAGNOSIS — Z792 Long term (current) use of antibiotics: Secondary | ICD-10-CM | POA: Insufficient documentation

## 2015-09-24 LAB — CBC WITH DIFFERENTIAL/PLATELET
Basophils Absolute: 0.1 10*3/uL (ref 0.0–0.1)
Basophils Relative: 1 %
EOS PCT: 3 %
Eosinophils Absolute: 0.2 10*3/uL (ref 0.0–0.7)
HCT: 40.4 % (ref 36.0–46.0)
HEMOGLOBIN: 13.4 g/dL (ref 12.0–15.0)
LYMPHS ABS: 3.9 10*3/uL (ref 0.7–4.0)
LYMPHS PCT: 44 %
MCH: 25.1 pg — AB (ref 26.0–34.0)
MCHC: 33.2 g/dL (ref 30.0–36.0)
MCV: 75.8 fL — ABNORMAL LOW (ref 78.0–100.0)
MONO ABS: 0.4 10*3/uL (ref 0.1–1.0)
MONOS PCT: 5 %
NEUTROS ABS: 4.4 10*3/uL (ref 1.7–7.7)
Neutrophils Relative %: 49 %
Platelets: 536 10*3/uL — ABNORMAL HIGH (ref 150–400)
RBC: 5.33 MIL/uL — AB (ref 3.87–5.11)
RDW: 17.5 % — ABNORMAL HIGH (ref 11.5–15.5)
WBC: 9.1 10*3/uL (ref 4.0–10.5)

## 2015-09-24 LAB — BASIC METABOLIC PANEL
ANION GAP: 12 (ref 5–15)
BUN: 16 mg/dL (ref 6–20)
CHLORIDE: 113 mmol/L — AB (ref 101–111)
CO2: 17 mmol/L — AB (ref 22–32)
Calcium: 9 mg/dL (ref 8.9–10.3)
Creatinine, Ser: 0.4 mg/dL — ABNORMAL LOW (ref 0.44–1.00)
GFR calc Af Amer: >60 mL/min (ref >60)
GFR calc non Af Amer: >60 mL/min (ref >60)
GLUCOSE: 204 mg/dL — AB (ref 65–99)
POTASSIUM: 3.4 mmol/L — AB (ref 3.5–5.1)
Sodium: 142 mmol/L (ref 135–145)

## 2015-09-24 LAB — BRAIN NATRIURETIC PEPTIDE: B NATRIURETIC PEPTIDE 5: 874.4 pg/mL — AB (ref 0.0–100.0)

## 2015-09-24 NOTE — Discharge Instructions (Signed)
Continue your medications as before.  Return to the emergency department if your symptoms significantly worsen or change.   Shortness of Breath Shortness of breath means you have trouble breathing. It could also mean that you have a medical problem. You should get immediate medical care for shortness of breath. CAUSES   Not enough oxygen in the air such as with high altitudes or a smoke-filled room.  Certain lung diseases, infections, or problems.  Heart disease or conditions, such as angina or heart failure.  Low red blood cells (anemia).  Poor physical fitness, which can cause shortness of breath when you exercise.  Chest or back injuries or stiffness.  Being overweight.  Smoking.  Anxiety, which can make you feel like you are not getting enough air. DIAGNOSIS  Serious medical problems can often be found during your physical exam. Tests may also be done to determine why you are having shortness of breath. Tests may include:  Chest X-rays.  Lung function tests.  Blood tests.  An electrocardiogram (ECG).  An ambulatory electrocardiogram. An ambulatory ECG records your heartbeat patterns over a 24-hour period.  Exercise testing.  A transthoracic echocardiogram (TTE). During echocardiography, sound waves are used to evaluate how blood flows through your heart.  A transesophageal echocardiogram (TEE).  Imaging scans. Your health care provider may not be able to find a cause for your shortness of breath after your exam. In this case, it is important to have a follow-up exam with your health care provider as directed.  TREATMENT  Treatment for shortness of breath depends on the cause of your symptoms and can vary greatly. HOME CARE INSTRUCTIONS   Do not smoke. Smoking is a common cause of shortness of breath. If you smoke, ask for help to quit.  Avoid being around chemicals or things that may bother your breathing, such as paint fumes and dust.  Rest as needed.  Slowly resume your usual activities.  If medicines were prescribed, take them as directed for the full length of time directed. This includes oxygen and any inhaled medicines.  Keep all follow-up appointments as directed by your health care provider. SEEK MEDICAL CARE IF:   Your condition does not improve in the time expected.  You have a hard time doing your normal activities even with rest.  You have any new symptoms. SEEK IMMEDIATE MEDICAL CARE IF:   Your shortness of breath gets worse.  You feel light-headed, faint, or develop a cough not controlled with medicines.  You start coughing up blood.  You have pain with breathing.  You have chest pain or pain in your arms, shoulders, or abdomen.  You have a fever.  You are unable to walk up stairs or exercise the way you normally do. MAKE SURE YOU:  Understand these instructions.  Will watch your condition.  Will get help right away if you are not doing well or get worse.   This information is not intended to replace advice given to you by your health care provider. Make sure you discuss any questions you have with your health care provider.   Document Released: 02/28/2001 Document Revised: 06/10/2013 Document Reviewed: 08/21/2011 Elsevier Interactive Patient Education [employer].

## 2015-09-24 NOTE — ED Notes (Signed)
[facility] here to transport pt back to her home. 

## 2015-09-24 NOTE — Telephone Encounter (Signed)
Pt states she would like to cancel her 09/27/2015 appt with Dr. Jacqualyn Posey.

## 2015-09-24 NOTE — ED Provider Notes (Signed)
CSN: [id number]     Arrival date & time 09/24/15  1914 History   First MD Initiated Contact with Patient 09/24/15 1926     Chief Complaint  Patient presents with  . Shortness of Breath     (Consider location/radiation/quality/duration/timing/severity/associated sxs/prior Treatment) HPI Comments: Patient is a 60 year old female with history of multiple sclerosis, hypertension, and tight to diabetes. She presents for evaluation of shortness of breath. She had an episode of this at home this evening she was sitting up getting ready to eat. She felt like her heart was racing. This lasted for several minutes, then resolved. She has had several episodes of this over the past week. She denies any chest pain, fevers, or cough. She states she feels fine now and has no complaints.  Patient is a 59 y.o. female presenting with shortness of breath. The history is provided by the patient.  Shortness of Breath Severity:  Moderate Onset quality:  Sudden Duration:  30 minutes Timing:  Constant Progression:  Resolved Chronicity:  Recurrent Relieved by:  Nothing Worsened by:  Nothing tried Ineffective treatments:  None tried Associated symptoms: no abdominal pain, no chest pain and no fever     Past Medical History  Diagnosis Date  . MS (multiple sclerosis) ([place])   . HTN (hypertension)   . DM II (diabetes mellitus, type II), controlled ([place])   . Lymphedema   . Anemia   . Gait disorder    Past Surgical History  Procedure Laterality Date  . Flexible sigmoidoscopy Left 03/09/2015    Procedure: FLEXIBLE SIGMOIDOSCOPY;  Surgeon: Carol Ada, MD;  Location: [facility];  Service: Endoscopy;  Laterality: Left;   Family History  Problem Relation Age of Onset  . Multiple sclerosis Mother    Social History  Substance Use Topics  . Smoking status: Former Smoker    Types: Cigarettes  . Smokeless tobacco: Never Used  . Alcohol Use: No   OB History    Gravida Para Term Preterm AB TAB SAB Ectopic  Multiple Living   3 3 0 0 0 0 0 0 0 0      Review of Systems  Constitutional: Negative for fever.  Respiratory: Positive for shortness of breath.   Cardiovascular: Negative for chest pain.  Gastrointestinal: Negative for abdominal pain.  All other systems reviewed and are negative.     Allergies  Sulfa antibiotics  Home Medications   Prior to Admission medications   Medication Sig Start Date End Date Taking? Authorizing Provider  acetaminophen (TYLENOL) 325 MG tablet Take 2 tablets (650 mg total) by mouth every 6 (six) hours as needed for mild pain (or Fever >/= 101). 03/12/15   Robbie Lis, MD  amoxicillin-clavulanate (AUGMENTIN) 875-125 MG tablet Take 1 tablet by mouth 2 (two) times daily. 09/20/15   Trula Slade, DPM  aspirin EC 81 MG EC tablet Take 1 tablet (81 mg total) by mouth daily. 12/04/14   Dixie Dials, MD  baclofen (LIORESAL) 10 MG tablet Take 0.5 tablets (5 mg total) by mouth 3 (three) times daily. 05/14/13   Dennie Bible, NP  carvedilol (COREG) 3.125 MG tablet Take 3.125 mg by mouth 2 (two) times daily. 04/25/15   Historical Provider, MD  ciprofloxacin (CIPRO) 500 MG tablet Take 1 tablet (500 mg total) by mouth 2 (two) times daily. One po bid x 7 days 07/26/15   Malvin Johns, MD  diphenhydramine-acetaminophen (TYLENOL PM) 25-500 MG TABS tablet Take 1 tablet by mouth at bedtime as needed (for sleep).  Historical Provider, MD  docusate sodium (COLACE) 100 MG capsule Take 1 capsule (100 mg total) by mouth 2 (two) times daily. 05/13/15   Dixie Dials, MD  feeding supplement, GLUCERNA SHAKE, (GLUCERNA SHAKE) LIQD Take 237 mLs by mouth 2 (two) times daily between meals. 05/13/15   Dixie Dials, MD  ferrous sulfate 325 (65 FE) MG tablet Take 1 tablet (325 mg total) by mouth daily with breakfast. 05/13/15   Dixie Dials, MD  glipiZIDE (GLUCOTROL) 5 MG tablet Take 0.5 tablets by mouth 2 (two) times daily. 04/19/15   Historical Provider, MD  heparin 5000 UNIT/ML  injection Inject 1 mL (5,000 Units total) into the skin every 8 (eight) hours. Patient not taking: Reported on 07/26/2015 05/13/15   Dixie Dials, MD  HYDROcodone-acetaminophen (NORCO/VICODIN) 5-325 MG per tablet Take 2 tablets by mouth every 4 (four) hours as needed for moderate pain. 03/12/15   Robbie Lis, MD  methocarbamol (ROBAXIN) 500 MG tablet Take 0.5 tablets (250 mg total) by mouth every 8 (eight) hours as needed for muscle spasms. 03/12/15   Robbie Lis, MD  Multiple Vitamin (MULTIVITAMIN WITH MINERALS) TABS tablet Take 1 tablet by mouth daily. 05/13/15   Dixie Dials, MD  potassium chloride 20 MEQ/15ML (10%) SOLN Take 7.5 mLs (10 mEq total) by mouth daily. 05/13/15   Dixie Dials, MD  SANTYL ointment Apply 1 application topically 2 (two) times daily. 04/21/15   Historical Provider, MD  vitamin C (VITAMIN C) 500 MG tablet Take 1 tablet (500 mg total) by mouth daily. 05/13/15   Dixie Dials, MD  zolpidem (AMBIEN) 5 MG tablet Take 1 tablet (5 mg total) by mouth at bedtime as needed for sleep (insomnia). 05/18/15   Estill Dooms, MD   There were no vitals taken for this visit. Physical Exam  Constitutional: She is oriented to person, place, and time. She appears well-developed and well-nourished. No distress.  HENT:  Head: Normocephalic and atraumatic.  Neck: Normal range of motion. Neck supple.  Cardiovascular: Normal rate and regular rhythm.  Exam reveals no gallop and no friction rub.   No murmur heard. Pulmonary/Chest: Effort normal and breath sounds normal. No respiratory distress. She has no wheezes. She has no rales.  Abdominal: Soft. Bowel sounds are normal. She exhibits no distension. There is no tenderness.  Musculoskeletal: Normal range of motion. She exhibits no edema.  Neurological: She is alert and oriented to person, place, and time.  Skin: Skin is warm and dry. She is not diaphoretic.  Nursing note and vitals reviewed.   ED Course  Procedures (including critical care  time) Labs Review Labs Reviewed  BASIC METABOLIC PANEL - Abnormal; Notable for the following:    Potassium 3.4 (*)    Chloride 113 (*)    CO2 17 (*)    Glucose, Bld 204 (*)    Creatinine, Ser 0.40 (*)    All other components within normal limits  CBC WITH DIFFERENTIAL/PLATELET - Abnormal; Notable for the following:    RBC 5.33 (*)    MCV 75.8 (*)    MCH 25.1 (*)    RDW 17.5 (*)    Platelets 536 (*)    All other components within normal limits  BRAIN NATRIURETIC PEPTIDE - Abnormal; Notable for the following:    B Natriuretic Peptide 874.4 (*)    All other components within normal limits    Imaging Review Dg Chest 2 View  09/24/2015  CLINICAL DATA:  Brought in by EMS from home with c/o  shortness of breath. Pt reports that she ha hx of hypertension . Was recently taken off Benicar and started on Coreg, which made her "feel not well" and tonight she "feels short of breath". hx MS, htn, diabetic, ex-smoker. EXAM: CHEST  2 VIEW COMPARISON:  07/26/2015 FINDINGS: Moderate enlargement of the cardiopericardial silhouette is stable. No mediastinal or hilar masses or evidence of adenopathy. Clear lungs.  No convincing pleural effusion.  No pneumothorax. Bony thorax is grossly intact. IMPRESSION: 1. No acute cardiopulmonary disease. 2. Stable cardiomegaly. Electronically Signed   By: Lajean Manes M.D.   On: 09/24/2015 20:34   I have personally reviewed and evaluated these images and lab results as part of my medical decision-making.   EKG Interpretation   Date/Time:  Friday September 24 2015 19:39:37 EDT Ventricular Rate:  96 PR Interval:  137 QRS Duration: 84 QT Interval:  452 QTC Calculation: 571 R Axis:   -20 Text Interpretation:  Sinus rhythm Probable left atrial enlargement  Probable left ventricular hypertrophy Borderline T abnormalities, diffuse  leads Prolonged QT interval Confirmed by Kemaria Dedic  MD, Bassy Fetterly ([id number]) on  09/24/2015 9:37:21 PM      MDM   Final diagnoses:  None     Patient presents with episodic shortness of breath over the past 1-2 weeks. This occurs at random and last for several seconds. She now feels fine and has no complaints. Her laboratory studies are essentially unremarkable. She does have an elevated BNP, however chest x-ray and lung auscultation or not consistent with CHF. She is now feeling better and desires to go home. I feel as though this is reasonable and will discharge her with when necessary follow-up.    Veryl Speak, MD 09/24/15 2138

## 2015-09-24 NOTE — ED Notes (Signed)
Bed: WA09 Expected date:  Expected time:  Means of arrival:  Comments: EMS-SOB 

## 2015-09-24 NOTE — ED Notes (Signed)
Brought in by EMS from home with c/o shortness of breath.  Pt reports that she ha hx of hypertension .  Was recently taken off Benicar and started on Coreg, which made her "feel not well" and tonight she "feels short of breath".

## 2015-09-27 ENCOUNTER — Ambulatory Visit: Payer: Medicare Other | Admitting: Podiatry

## 2015-10-26 ENCOUNTER — Encounter (HOSPITAL_COMMUNITY): Payer: Self-pay | Admitting: Emergency Medicine

## 2015-10-26 ENCOUNTER — Emergency Department (HOSPITAL_COMMUNITY): Payer: Medicare Other

## 2015-10-26 ENCOUNTER — Inpatient Hospital Stay (HOSPITAL_COMMUNITY)
Admission: EM | Admit: 2015-10-26 | Discharge: 2015-10-31 | DRG: 175 | Disposition: A | Discharge status: Skilled Nursing Facility | Payer: Medicare Other | Attending: Cardiology | Admitting: Cardiology

## 2015-10-26 ENCOUNTER — Emergency Department (HOSPITAL_COMMUNITY): Admit: 2015-10-26 | Payer: Medicare Other

## 2015-10-26 ENCOUNTER — Emergency Department (HOSPITAL_COMMUNITY)
Admit: 2015-10-26 | Discharge: 2015-10-26 | Disposition: A | Discharge status: Home / Self Care | Payer: Medicare Other | Attending: Emergency Medicine | Admitting: Emergency Medicine

## 2015-10-26 DIAGNOSIS — L8990 Pressure ulcer of unspecified site, unspecified stage: Secondary | ICD-10-CM | POA: Insufficient documentation

## 2015-10-26 DIAGNOSIS — Z86711 Personal history of pulmonary embolism: Secondary | ICD-10-CM | POA: Diagnosis present

## 2015-10-26 DIAGNOSIS — Z882 Allergy status to sulfonamides status: Secondary | ICD-10-CM

## 2015-10-26 DIAGNOSIS — E1165 Type 2 diabetes mellitus with hyperglycemia: Secondary | ICD-10-CM | POA: Diagnosis present

## 2015-10-26 DIAGNOSIS — Z7982 Long term (current) use of aspirin: Secondary | ICD-10-CM

## 2015-10-26 DIAGNOSIS — I2699 Other pulmonary embolism without acute cor pulmonale: Secondary | ICD-10-CM | POA: Diagnosis present

## 2015-10-26 DIAGNOSIS — L98429 Non-pressure chronic ulcer of back with unspecified severity: Secondary | ICD-10-CM | POA: Diagnosis present

## 2015-10-26 DIAGNOSIS — I10 Essential (primary) hypertension: Secondary | ICD-10-CM | POA: Diagnosis present

## 2015-10-26 DIAGNOSIS — L89154 Pressure ulcer of sacral region, stage 4: Secondary | ICD-10-CM | POA: Diagnosis present

## 2015-10-26 DIAGNOSIS — R0602 Shortness of breath: Secondary | ICD-10-CM

## 2015-10-26 DIAGNOSIS — I82431 Acute embolism and thrombosis of right popliteal vein: Secondary | ICD-10-CM | POA: Diagnosis present

## 2015-10-26 DIAGNOSIS — N390 Urinary tract infection, site not specified: Secondary | ICD-10-CM | POA: Diagnosis present

## 2015-10-26 DIAGNOSIS — Z87891 Personal history of nicotine dependence: Secondary | ICD-10-CM

## 2015-10-26 DIAGNOSIS — I420 Dilated cardiomyopathy: Secondary | ICD-10-CM | POA: Diagnosis present

## 2015-10-26 DIAGNOSIS — Z7984 Long term (current) use of oral hypoglycemic drugs: Secondary | ICD-10-CM

## 2015-10-26 DIAGNOSIS — G35 Multiple sclerosis: Secondary | ICD-10-CM | POA: Diagnosis present

## 2015-10-26 DIAGNOSIS — I82411 Acute embolism and thrombosis of right femoral vein: Secondary | ICD-10-CM | POA: Diagnosis present

## 2015-10-26 DIAGNOSIS — I890 Lymphedema, not elsewhere classified: Secondary | ICD-10-CM | POA: Diagnosis present

## 2015-10-26 DIAGNOSIS — Z7401 Bed confinement status: Secondary | ICD-10-CM

## 2015-10-26 DIAGNOSIS — D649 Anemia, unspecified: Secondary | ICD-10-CM | POA: Diagnosis present

## 2015-10-26 DIAGNOSIS — Z820 Family history of epilepsy and other diseases of the nervous system: Secondary | ICD-10-CM

## 2015-10-26 DIAGNOSIS — Z79899 Other long term (current) drug therapy: Secondary | ICD-10-CM | POA: Diagnosis not present

## 2015-10-26 DIAGNOSIS — B9620 Unspecified Escherichia coli [E. coli] as the cause of diseases classified elsewhere: Secondary | ICD-10-CM | POA: Diagnosis present

## 2015-10-26 LAB — BASIC METABOLIC PANEL
ANION GAP: 14 (ref 5–15)
BUN: 14 mg/dL (ref 6–20)
CHLORIDE: 109 mmol/L (ref 101–111)
CO2: 18 mmol/L — ABNORMAL LOW (ref 22–32)
Calcium: 8.4 mg/dL — ABNORMAL LOW (ref 8.9–10.3)
Creatinine, Ser: 0.46 mg/dL (ref 0.44–1.00)
GFR calc non Af Amer: >60 mL/min (ref >60)
Glucose, Bld: 210 mg/dL — ABNORMAL HIGH (ref 65–99)
POTASSIUM: 4 mmol/L (ref 3.5–5.1)
Sodium: 141 mmol/L (ref 135–145)

## 2015-10-26 LAB — CBC WITH DIFFERENTIAL/PLATELET
BASOS ABS: 0.1 10*3/uL (ref 0.0–0.1)
Basophils Relative: 1 %
EOS PCT: 2 %
Eosinophils Absolute: 0.1 10*3/uL (ref 0.0–0.7)
HEMATOCRIT: 49.2 % — AB (ref 36.0–46.0)
Hemoglobin: 15.8 g/dL — ABNORMAL HIGH (ref 12.0–15.0)
LYMPHS PCT: 53 %
Lymphs Abs: 3.2 10*3/uL (ref 0.7–4.0)
MCH: 24.5 pg — ABNORMAL LOW (ref 26.0–34.0)
MCHC: 32.1 g/dL (ref 30.0–36.0)
MCV: 76.3 fL — AB (ref 78.0–100.0)
Monocytes Absolute: 0.2 10*3/uL (ref 0.1–1.0)
Monocytes Relative: 3 %
NEUTROS ABS: 2.5 10*3/uL (ref 1.7–7.7)
Neutrophils Relative %: 41 %
PLATELETS: 456 10*3/uL — AB (ref 150–400)
RBC: 6.45 MIL/uL — AB (ref 3.87–5.11)
RDW: 19.6 % — ABNORMAL HIGH (ref 11.5–15.5)
WBC: 6.1 10*3/uL (ref 4.0–10.5)

## 2015-10-26 LAB — GLUCOSE, CAPILLARY: GLUCOSE-CAPILLARY: 196 mg/dL — AB (ref 65–99)

## 2015-10-26 LAB — TROPONIN I
TROPONIN I: 0.08 ng/mL — AB (ref <0.031)
TROPONIN I: 0.09 ng/mL — AB (ref <0.031)
Troponin I: 0.1 ng/mL — ABNORMAL HIGH (ref <0.031)

## 2015-10-26 LAB — BRAIN NATRIURETIC PEPTIDE: B NATRIURETIC PEPTIDE 5: 805.4 pg/mL — AB (ref 0.0–100.0)

## 2015-10-26 LAB — PROTIME-INR
INR: 1.28 (ref 0.00–1.49)
PROTHROMBIN TIME: 16.1 s — AB (ref 11.6–15.2)

## 2015-10-26 LAB — D-DIMER, QUANTITATIVE (NOT AT ARMC): D DIMER QUANT: 9.09 ug{FEU}/mL — AB (ref 0.00–0.50)

## 2015-10-26 LAB — MRSA PCR SCREENING: MRSA BY PCR: NEGATIVE

## 2015-10-26 LAB — APTT: aPTT: 76 seconds — ABNORMAL HIGH (ref 24–37)

## 2015-10-26 MED ORDER — DOCUSATE SODIUM 100 MG PO CAPS
100.0000 mg | ORAL_CAPSULE | Freq: Two times a day (BID) | ORAL | Status: DC
  Administered 2015-10-26 – 2015-10-31 (×10): 100 mg via ORAL
  Filled 2015-10-26 (×10): qty 1

## 2015-10-26 MED ORDER — CARVEDILOL 3.125 MG PO TABS
3.1250 mg | ORAL_TABLET | Freq: Two times a day (BID) | ORAL | Status: DC
  Administered 2015-10-26 – 2015-10-31 (×10): 3.125 mg via ORAL
  Filled 2015-10-26 (×10): qty 1

## 2015-10-26 MED ORDER — ASPIRIN 300 MG RE SUPP: 300.0000 mg | RECTAL | Status: AC

## 2015-10-26 MED ORDER — ONDANSETRON HCL 4 MG/2ML IJ SOLN: 4.0000 mg | Freq: Four times a day (QID) | INTRAMUSCULAR | Status: DC | PRN

## 2015-10-26 MED ORDER — DIPHENHYDRAMINE HCL 25 MG PO CAPS
25.0000 mg | ORAL_CAPSULE | Freq: Every evening | ORAL | Status: DC | PRN
  Administered 2015-10-26 – 2015-10-29 (×3): 25 mg via ORAL
  Filled 2015-10-26 (×3): qty 1

## 2015-10-26 MED ORDER — DIPHENHYDRAMINE-APAP (SLEEP) 25-500 MG PO TABS: 1.0000 | ORAL_TABLET | Freq: Every evening | ORAL | Status: DC | PRN

## 2015-10-26 MED ORDER — SODIUM CHLORIDE 0.9 % IV SOLN: INTRAVENOUS | Status: DC

## 2015-10-26 MED ORDER — IOPAMIDOL (ISOVUE-370) INJECTION 76%
INTRAVENOUS | Status: AC
  Administered 2015-10-26: 100 mL
  Filled 2015-10-26: qty 100

## 2015-10-26 MED ORDER — ASPIRIN 81 MG PO CHEW
324.0000 mg | CHEWABLE_TABLET | ORAL | Status: AC
  Administered 2015-10-26: 324 mg via ORAL
  Filled 2015-10-26: qty 4

## 2015-10-26 MED ORDER — INSULIN ASPART 100 UNIT/ML ~~LOC~~ SOLN
0.0000 [IU] | Freq: Three times a day (TID) | SUBCUTANEOUS | Status: DC
  Administered 2015-10-27 – 2015-10-28 (×3): 2 [IU] via SUBCUTANEOUS
  Administered 2015-10-28: 1 [IU] via SUBCUTANEOUS
  Administered 2015-10-29: 2 [IU] via SUBCUTANEOUS
  Administered 2015-10-29: 3 [IU] via SUBCUTANEOUS
  Administered 2015-10-29: 2 [IU] via SUBCUTANEOUS
  Administered 2015-10-30: 3 [IU] via SUBCUTANEOUS
  Administered 2015-10-30 – 2015-10-31 (×5): 2 [IU] via SUBCUTANEOUS

## 2015-10-26 MED ORDER — NITROGLYCERIN 0.4 MG SL SUBL: 0.4000 mg | SUBLINGUAL_TABLET | SUBLINGUAL | Status: DC | PRN

## 2015-10-26 MED ORDER — GLIPIZIDE 5 MG PO TABS
5.0000 mg | ORAL_TABLET | Freq: Two times a day (BID) | ORAL | Status: DC
  Administered 2015-10-27 – 2015-10-31 (×10): 5 mg via ORAL
  Filled 2015-10-26 (×10): qty 1

## 2015-10-26 MED ORDER — ACETAMINOPHEN 500 MG PO TABS
500.0000 mg | ORAL_TABLET | Freq: Every evening | ORAL | Status: DC | PRN
  Administered 2015-10-29: 500 mg via ORAL
  Filled 2015-10-26: qty 1

## 2015-10-26 MED ORDER — PANTOPRAZOLE SODIUM 40 MG PO TBEC
40.0000 mg | DELAYED_RELEASE_TABLET | Freq: Every day | ORAL | Status: DC
  Administered 2015-10-27 – 2015-10-31 (×5): 40 mg via ORAL
  Filled 2015-10-26 (×5): qty 1

## 2015-10-26 MED ORDER — HEPARIN BOLUS VIA INFUSION
4000.0000 [IU] | Freq: Once | INTRAVENOUS | Status: AC
  Administered 2015-10-26: 4000 [IU] via INTRAVENOUS
  Filled 2015-10-26: qty 4000

## 2015-10-26 MED ORDER — HEPARIN (PORCINE) IN NACL 100-0.45 UNIT/ML-% IJ SOLN
1300.0000 [IU]/h | INTRAMUSCULAR | Status: DC
  Administered 2015-10-26: 1100 [IU]/h via INTRAVENOUS
  Filled 2015-10-26: qty 250

## 2015-10-26 MED ORDER — ACETAMINOPHEN 325 MG PO TABS
650.0000 mg | ORAL_TABLET | ORAL | Status: DC | PRN
  Administered 2015-10-26 – 2015-10-31 (×9): 650 mg via ORAL
  Filled 2015-10-26 (×9): qty 2

## 2015-10-26 MED ORDER — ASPIRIN EC 81 MG PO TBEC
81.0000 mg | DELAYED_RELEASE_TABLET | Freq: Every day | ORAL | Status: DC
  Administered 2015-10-27 – 2015-10-31 (×5): 81 mg via ORAL
  Filled 2015-10-26 (×6): qty 1

## 2015-10-26 NOTE — ED Provider Notes (Signed)
CSN: [id number]     Arrival date & time 10/26/15  1051 History   First MD Initiated Contact with Patient 10/26/15 1107     Chief Complaint  Patient presents with  . Shortness of Breath     (Consider location/radiation/quality/duration/timing/severity/associated sxs/prior Treatment) HPI Ms. Delis is a 60 yo female with PMH HTN, MS, DMII, and sinus tachycardia, presenting with 1 month history of SOB "attacks." Patient and family report episodes, at least 4 times daily, with that last ~1 minute at a time.  During these episodes, patient is breathing quickly and unable to speak in full sentences.  They occur at all times during the day, including when eating, taking, or not doing anything.  She endorses cough as well for the last month that is nonproductive, sometimes a/w water, but denies a/w food.  She denies dysphagia or odynophagia.    She has no h/o CHF and echo in 02/2015 with normal EF.  She has chronic LE edema, which has recently decreased.  However, patient's family reports her abdomen is now more swollen.  She endorses orthopnea, but it is stable on 1 pillow.  Family endorses that patient will wake up in the middle of the night short of breath.  She is unsure whether she has gained any weight.  She has a h/o sinus tachycardia, recorded during admission 04/2015.  Patient is nonambulatory due to her MS and is not taking DVT prophylaxis.  Remaining ROS negative.  Past Medical History  Diagnosis Date  . MS (multiple sclerosis) ([place])   . HTN (hypertension)   . DM II (diabetes mellitus, type II), controlled ([place])   . Lymphedema   . Anemia   . Gait disorder    Past Surgical History  Procedure Laterality Date  . Flexible sigmoidoscopy Left 03/09/2015    Procedure: FLEXIBLE SIGMOIDOSCOPY;  Surgeon: Carol Ada, MD;  Location: [facility];  Service: Endoscopy;  Laterality: Left;   Family History  Problem Relation Age of Onset  . Multiple sclerosis Mother    Social History   Substance Use Topics  . Smoking status: Former Smoker    Types: Cigarettes  . Smokeless tobacco: Never Used  . Alcohol Use: No   OB History    Gravida Para Term Preterm AB TAB SAB Ectopic Multiple Living   3 3 0 0 0 0 0 0 0 0      Review of Systems  Constitutional: Negative for fever and chills.  HENT: Negative for congestion and trouble swallowing.   Respiratory: Positive for cough and shortness of breath. Negative for wheezing.   Cardiovascular: Negative for chest pain, palpitations and leg swelling.  Gastrointestinal: Negative for nausea, vomiting, abdominal pain, diarrhea and constipation.  Genitourinary: Negative for dysuria.  Neurological: Negative for syncope and headaches.  Psychiatric/Behavioral: Negative for confusion.      Allergies  Sulfa antibiotics  Home Medications   Prior to Admission medications   Medication Sig Start Date End Date Taking? Authorizing Provider  acetaminophen (TYLENOL) 325 MG tablet Take 2 tablets (650 mg total) by mouth every 6 (six) hours as needed for mild pain (or Fever >/= 101). 03/12/15   Robbie Lis, MD  amoxicillin-clavulanate (AUGMENTIN) 875-125 MG tablet Take 1 tablet by mouth 2 (two) times daily. 09/20/15   Trula Slade, DPM  aspirin EC 81 MG EC tablet Take 1 tablet (81 mg total) by mouth daily. 12/04/14   Dixie Dials, MD  baclofen (LIORESAL) 10 MG tablet Take 0.5 tablets (5 mg total) by mouth  3 (three) times daily. Patient not taking: Reported on 09/24/2015 05/14/13   Dennie Bible, NP  carvedilol (COREG) 3.125 MG tablet Take 3.125 mg by mouth 2 (two) times daily. 04/25/15   Historical Provider, MD  ciprofloxacin (CIPRO) 500 MG tablet Take 1 tablet (500 mg total) by mouth 2 (two) times daily. One po bid x 7 days Patient not taking: Reported on 09/24/2015 07/26/15   Malvin Johns, MD  diphenhydramine-acetaminophen (TYLENOL PM) 25-500 MG TABS tablet Take 1 tablet by mouth at bedtime as needed (for sleep).    Historical Provider,  MD  docusate sodium (COLACE) 100 MG capsule Take 1 capsule (100 mg total) by mouth 2 (two) times daily. 05/13/15   Dixie Dials, MD  feeding supplement, GLUCERNA SHAKE, (GLUCERNA SHAKE) LIQD Take 237 mLs by mouth 2 (two) times daily between meals. Patient not taking: Reported on 09/24/2015 05/13/15   Dixie Dials, MD  ferrous sulfate 325 (65 FE) MG tablet Take 1 tablet (325 mg total) by mouth daily with breakfast. 05/13/15   Dixie Dials, MD  glipiZIDE (GLUCOTROL) 5 MG tablet Take 0.5 tablets by mouth 2 (two) times daily. 04/19/15   Historical Provider, MD  heparin 5000 UNIT/ML injection Inject 1 mL (5,000 Units total) into the skin every 8 (eight) hours. Patient not taking: Reported on 07/26/2015 05/13/15   Dixie Dials, MD  HYDROcodone-acetaminophen (NORCO/VICODIN) 5-325 MG per tablet Take 2 tablets by mouth every 4 (four) hours as needed for moderate pain. 03/12/15   Robbie Lis, MD  methocarbamol (ROBAXIN) 500 MG tablet Take 0.5 tablets (250 mg total) by mouth every 8 (eight) hours as needed for muscle spasms. 03/12/15   Robbie Lis, MD  Multiple Vitamin (MULTIVITAMIN WITH MINERALS) TABS tablet Take 1 tablet by mouth daily. Patient not taking: Reported on 09/24/2015 05/13/15   Dixie Dials, MD  potassium chloride 20 MEQ/15ML (10%) SOLN Take 7.5 mLs (10 mEq total) by mouth daily. Patient not taking: Reported on 09/24/2015 05/13/15   Dixie Dials, MD  vitamin C (VITAMIN C) 500 MG tablet Take 1 tablet (500 mg total) by mouth daily. Patient not taking: Reported on 09/24/2015 05/13/15   Dixie Dials, MD  zolpidem (AMBIEN) 5 MG tablet Take 1 tablet (5 mg total) by mouth at bedtime as needed for sleep (insomnia). 05/18/15   Estill Dooms, MD   BP 158/106 mmHg  Pulse 99  Temp(Src) 98.3 F (36.8 C) (Rectal)  Resp 20  SpO2 100% Physical Exam  Constitutional: She is oriented to person, place, and time. She appears well-developed and well-nourished.  Chronically ill appearing female, lying bed, NAD.   HENT:  Head: Normocephalic and atraumatic.  Eyes: EOM are normal. No scleral icterus.  Neck: No tracheal deviation present.  Cardiovascular: Regular rhythm.   Borderline tachycardic. Regular rhythm. No murmurs or gallops appreciated.  Pulmonary/Chest: No stridor.  Poor inspiratory effort. No wheezes or crackles appreciated.  Abdominal: Soft. She exhibits no distension. There is no tenderness. There is no rebound and no guarding.  Abdomen asymmetric with patient lying more on right side.  Nontender to palpation.  Musculoskeletal:  1+ edema to ankle bilaterally.  Neurological: She is alert and oriented to person, place, and time.  Skin: Skin is warm and dry.    ED Course  Procedures (including critical care time) Labs Review Labs Reviewed  CBC WITH DIFFERENTIAL/PLATELET  BASIC METABOLIC PANEL  TROPONIN I  BRAIN NATRIURETIC PEPTIDE  D-DIMER, QUANTITATIVE (NOT AT [facility])    Imaging Review No results found.  I have personally reviewed and evaluated these images and lab results as part of my medical decision-making.   EKG Interpretation None      MDM   Final diagnoses:  None    60 yo female with MS, HTN, DMII, and sinus tachycardia presenting with 1 month brief intermittent episodes of SOB.  She has been seen previously in the ED with unrevealing evaluation.  Given her MS, etiologies include chronic aspiration, chronic PE, or worsening MS.  Recent echo does not demonstrate reduced EF.  Troponin slightly elevated and D-dimer markedly elevated.  Patient satting 100% on RA, so will not start anticoagulation at this time.  Awaiting CTA chest and LE dopplers to r/o DVT and PE.  Patient will need medicine admission regardless for further cardiopulmonary workup. - CTA chest positive for PE - Troponin - LE dopplers - Admit patient    Iline Oven, MD 10/29/15 [place], MD 10/31/15 0040  Elnora Morrison, MD 10/31/15 [phone]

## 2015-10-26 NOTE — ED Notes (Addendum)
Per EMS, Patient called them for shortness of breath but denies other symptoms.   Patient states that she had her blood pressure medications adjusted recently and has had some shortness of breath for about a week.   Patient is a total care patient at home.  Her daughter is her caregiver.   Patient 02 levels 98% on 2L for [facility].  CBG was 210.   Patient denies shortness of breath or any other symptoms at triage.

## 2015-10-26 NOTE — ED Provider Notes (Signed)
CSN: [id number]     Arrival date & time 10/26/15  1051 History   First MD Initiated Contact with Patient 10/26/15 1107     Chief Complaint  Patient presents with  . Shortness of Breath     (Consider location/radiation/quality/duration/timing/severity/associated sxs/prior Treatment) HPI  Past Medical History  Diagnosis Date  . MS (multiple sclerosis) ([place])   . HTN (hypertension)   . DM II (diabetes mellitus, type II), controlled ([place])   . Lymphedema   . Anemia   . Gait disorder    Past Surgical History  Procedure Laterality Date  . Flexible sigmoidoscopy Left 03/09/2015    Procedure: FLEXIBLE SIGMOIDOSCOPY;  Surgeon: Carol Ada, MD;  Location: [facility];  Service: Endoscopy;  Laterality: Left;   Family History  Problem Relation Age of Onset  . Multiple sclerosis Mother    Social History  Substance Use Topics  . Smoking status: Former Smoker    Types: Cigarettes  . Smokeless tobacco: Never Used  . Alcohol Use: No   OB History    Gravida Para Term Preterm AB TAB SAB Ectopic Multiple Living   3 3 0 0 0 0 0 0 0 0      Review of Systems    Allergies  Sulfa antibiotics  Home Medications   Prior to Admission medications   Medication Sig Start Date End Date Taking? Authorizing Provider  acetaminophen (TYLENOL) 325 MG tablet Take 2 tablets (650 mg total) by mouth every 6 (six) hours as needed for mild pain (or Fever >/= 101). 03/12/15  Yes Robbie Lis, MD  aspirin EC 81 MG EC tablet Take 1 tablet (81 mg total) by mouth daily. 12/04/14  Yes Dixie Dials, MD  carvedilol (COREG) 3.125 MG tablet Take 3.125 mg by mouth 2 (two) times daily. 04/25/15  Yes Historical Provider, MD  diphenhydramine-acetaminophen (TYLENOL PM) 25-500 MG TABS tablet Take 1 tablet by mouth at bedtime as needed (for sleep).   Yes Historical Provider, MD  docusate sodium (COLACE) 100 MG capsule Take 1 capsule (100 mg total) by mouth 2 (two) times daily. 05/13/15  Yes Dixie Dials, MD  glipiZIDE  (GLUCOTROL) 5 MG tablet Take 2.5 tablets by mouth 2 (two) times daily.  04/19/15  Yes Historical Provider, MD  HYDROcodone-acetaminophen (NORCO/VICODIN) 5-325 MG per tablet Take 2 tablets by mouth every 4 (four) hours as needed for moderate pain. 03/12/15  Yes Robbie Lis, MD  methocarbamol (ROBAXIN) 500 MG tablet Take 0.5 tablets (250 mg total) by mouth every 8 (eight) hours as needed for muscle spasms. 03/12/15  Yes Robbie Lis, MD  zolpidem (AMBIEN) 5 MG tablet Take 1 tablet (5 mg total) by mouth at bedtime as needed for sleep (insomnia). 05/18/15  Yes Estill Dooms, MD   BP 146/98 mmHg  Pulse 72  Temp(Src) 98.3 F (36.8 C) (Rectal)  Resp 20  SpO2 94% Physical Exam  ED Course  Procedures (including critical care time) Labs Review Labs Reviewed  CBC WITH DIFFERENTIAL/PLATELET - Abnormal; Notable for the following:    RBC 6.45 (*)    Hemoglobin 15.8 (*)    HCT 49.2 (*)    MCV 76.3 (*)    MCH 24.5 (*)    RDW 19.6 (*)    Platelets 456 (*)    All other components within normal limits  BASIC METABOLIC PANEL - Abnormal; Notable for the following:    CO2 18 (*)    Glucose, Bld 210 (*)    Calcium 8.4 (*)  All other components within normal limits  TROPONIN I - Abnormal; Notable for the following:    Troponin I 0.08 (*)    All other components within normal limits  BRAIN NATRIURETIC PEPTIDE - Abnormal; Notable for the following:    B Natriuretic Peptide 805.4 (*)    All other components within normal limits  D-DIMER, QUANTITATIVE (NOT AT [facility]) - Abnormal; Notable for the following:    D-Dimer, Quant 9.09 (*)    All other components within normal limits  TROPONIN I - Abnormal; Notable for the following:    Troponin I 0.09 (*)    All other components within normal limits  HEPARIN LEVEL (UNFRACTIONATED)  CBC    Imaging Review Dg Chest 1 View  10/26/2015  CLINICAL DATA:  Shortness of breath EXAM: CHEST 1 VIEW COMPARISON:  09/24/2015 FINDINGS: Chronic cardiomegaly. Stable  aortic and hilar contours. Obscured left diaphragm with increased opacity, accentuated by underpenetration. No edema, effusion, or pneumothorax. IMPRESSION: 1. Retrocardiac atelectasis or pneumonia. 2. Cardiomegaly without failure. Electronically Signed   By: Monte Fantasia M.D.   On: 10/26/2015 12:40   Ct Angio Chest Pe W/cm &/or Wo Cm  10/26/2015  CLINICAL DATA:  60 year old with sudden onset of shortness of breath and elevated D-dimer. Paralyzed patient. EXAM: CT ANGIOGRAPHY CHEST WITH CONTRAST TECHNIQUE: Multidetector CT imaging of the chest was performed using the standard protocol during bolus administration of intravenous contrast. Multiplanar CT image reconstructions and MIPs were obtained to evaluate the vascular anatomy. CONTRAST:  100 mL Isovue 370 COMPARISON:  Chest CT 03/07/2015 FINDINGS: Mediastinum/Lymph Nodes: Positive for bilateral pulmonary emboli. There is clot extending into the segmental branches in left upper lobe and left lower lobar artery. Clot extending into the left lower lobe segmental branches. There is clot in the proximal right lower lobe branches and there may be a small amount of clot in the proximal right middle lobe branch. There is no significant enlargement of the right ventricle. No evidence to suggest right heart strain. There are coronary artery calcifications. No significant pericardial fluid. Evaluation for chest lymphadenopathy is limited but no significant chest lymphadenopathy. Evidence for subcutaneous edema throughout the chest. Lungs/Pleura: Small to moderate sized bilateral pleural effusions, right side greater than left. Pleural effusions are slightly smaller compared to the study in 2016. There is volume loss and atelectasis in the left lower lobe. The trachea and mainstem bronchi are patent. 4 mm nodule in the anterior right upper lobe on sequence 507, image 29 is essentially stable since 03/07/2015. Upper abdomen: Contrast refluxes into the hepatic veins.  Limited evaluation of the upper abdominal structures. Musculoskeletal: Striated appearance of the vertebral bodies probably related to osteopenia and underlying paralysis. Vertebral body heights are maintained. Some motion artifact in the sternal region. Review of the MIP images confirms the above findings. IMPRESSION: Positive for bilateral pulmonary emboli. Moderate clot burden. No evidence for right heart strain. Small to moderate sized bilateral pleural effusions with atelectasis or scarring at the left lung base. 4 mm nodule in the right upper lobe has not significantly changed since 09/18 11/2014. This small nodule is indeterminate. No follow-up needed if patient is low-risk. Non-contrast chest CT can be considered in 12 months if patient is high-risk. This recommendation follows the consensus statement: Guidelines for Management of Incidental Pulmonary Nodules Detected on CT Images:From the Fleischner Society 2017; published online before print (10.1148/radiol.SG:[id number]). Electronically Signed   By: Markus Daft M.D.   On: 10/26/2015 17:38   I have personally reviewed and  evaluated these images and lab results as part of my medical decision-making.   EKG Interpretation None      MDM  PT'S PCP IS DR. KADAKIA, SO I SPOKE WITH DR. Terrence Dupont FOR ADMISSION.  THE FAMILY SAID PT WAS ON A BLOOD THINNER AT THE NH, BUT SINCE SHE'S BEEN HOME (ABOUT 3 MONTHS), SHE WAS NOT CONTINUED ON IT. Final diagnoses:  Bilateral pulmonary embolism ([place])  Poorly controlled diabetes mellitus ([place])  Multiple sclerosis ([place])        Isla Pence, MD 10/26/15 2226

## 2015-10-26 NOTE — H&P (Signed)
Jocelyn Sanchez is an 60 y.o. female.   Chief Complaint: Progressive shortness of breath associated with feeling tired and weak HPI: Patient is 60 year old female with past medical history significant for multiple sclerosis bedbound, type 2 diabetes mellitus, hypertension, chronic lower extremity lymphedema, anemia, chronic back ulcers, came to the ER by EMS complaining of progressive increasing shortness of breath for 3-4 weeks associated with feeling weak tired. Patient denies any cough fever or chills. Denies hemoptysis patient was noted to have elevated d-dimer subsequently had CTA which was positive for bilateral pulmonary embolism with no evidence of right heart strain. Patient was started on IV heparin in the ED. Patient denies any chest pain denies any palpitation lightheadedness or syncope.  Past Medical History  Diagnosis Date  . MS (multiple sclerosis) ([place])   . HTN (hypertension)   . DM II (diabetes mellitus, type II), controlled ([place])   . Lymphedema   . Anemia   . Gait disorder     Past Surgical History  Procedure Laterality Date  . Flexible sigmoidoscopy Left 03/09/2015    Procedure: FLEXIBLE SIGMOIDOSCOPY;  Surgeon: Carol Ada, MD;  Location: [facility];  Service: Endoscopy;  Laterality: Left;    Family History  Problem Relation Age of Onset  . Multiple sclerosis Mother    Social History:  reports that she has quit smoking. Her smoking use included Cigarettes. She has never used smokeless tobacco. She reports that she does not drink alcohol or use illicit drugs.  Allergies:  Allergies  Allergen Reactions  . Sulfa Antibiotics Shortness Of Breath and Swelling     (Not in a hospital admission)  Results for orders placed or performed during the hospital encounter of 10/26/15 (from the past 48 hour(s))  CBC with Differential     Status: Abnormal   Collection Time: 10/26/15 11:55 AM  Result Value Ref Range   WBC 6.1 4.0 - 10.5 K/uL   RBC 6.45 (H) 3.87 - 5.11  MIL/uL   Hemoglobin 15.8 (H) 12.0 - 15.0 g/dL   HCT 49.2 (H) 36.0 - 46.0 %   MCV 76.3 (L) 78.0 - 100.0 fL   MCH 24.5 (L) 26.0 - 34.0 pg   MCHC 32.1 30.0 - 36.0 g/dL   RDW 19.6 (H) 11.5 - 15.5 %   Platelets 456 (H) 150 - 400 K/uL   Neutrophils Relative % 41 %   Neutro Abs 2.5 1.7 - 7.7 K/uL   Lymphocytes Relative 53 %   Lymphs Abs 3.2 0.7 - 4.0 K/uL   Monocytes Relative 3 %   Monocytes Absolute 0.2 0.1 - 1.0 K/uL   Eosinophils Relative 2 %   Eosinophils Absolute 0.1 0.0 - 0.7 K/uL   Basophils Relative 1 %   Basophils Absolute 0.1 0.0 - 0.1 K/uL  Basic metabolic panel     Status: Abnormal   Collection Time: 10/26/15 11:55 AM  Result Value Ref Range   Sodium 141 135 - 145 mmol/L   Potassium 4.0 3.5 - 5.1 mmol/L   Chloride 109 101 - 111 mmol/L   CO2 18 (L) 22 - 32 mmol/L   Glucose, Bld 210 (H) 65 - 99 mg/dL   BUN 14 6 - 20 mg/dL   Creatinine, Ser 0.46 0.44 - 1.00 mg/dL   Calcium 8.4 (L) 8.9 - 10.3 mg/dL   GFR calc non Af Amer >60 >60 mL/min   GFR calc Af Amer >60 >60 mL/min    Comment: (NOTE) The eGFR has been calculated using the CKD EPI equation.  This calculation has not been validated in all clinical situations. eGFR's persistently <60 mL/min signify possible Chronic Kidney Disease.    Anion gap 14 5 - 15  Troponin I     Status: Abnormal   Collection Time: 10/26/15 11:55 AM  Result Value Ref Range   Troponin I 0.08 (H) <0.031 ng/mL    Comment:        PERSISTENTLY INCREASED TROPONIN VALUES IN THE RANGE OF 0.04-0.49 ng/mL CAN BE SEEN IN:       -UNSTABLE ANGINA       -CONGESTIVE HEART FAILURE       -MYOCARDITIS       -CHEST TRAUMA       -ARRYHTHMIAS       -LATE PRESENTING MYOCARDIAL INFARCTION       -COPD   CLINICAL FOLLOW-UP RECOMMENDED.   Brain natriuretic peptide     Status: Abnormal   Collection Time: 10/26/15 11:55 AM  Result Value Ref Range   B Natriuretic Peptide 805.4 (H) 0.0 - 100.0 pg/mL  D-dimer, quantitative (not at [facility])     Status: Abnormal    Collection Time: 10/26/15 11:55 AM  Result Value Ref Range   D-Dimer, Quant 9.09 (H) 0.00 - 0.50 ug/mL-FEU    Comment: (NOTE) At the manufacturer cut-off of 0.50 ug/mL FEU, this assay has been documented to exclude PE with a sensitivity and negative predictive value of 97 to 99%.  At this time, this assay has not been approved by the FDA to exclude DVT/VTE. Results should be correlated with clinical presentation.   Troponin I     Status: Abnormal   Collection Time: 10/26/15  4:58 PM  Result Value Ref Range   Troponin I 0.09 (H) <0.031 ng/mL    Comment:        PERSISTENTLY INCREASED TROPONIN VALUES IN THE RANGE OF 0.04-0.49 ng/mL CAN BE SEEN IN:       -UNSTABLE ANGINA       -CONGESTIVE HEART FAILURE       -MYOCARDITIS       -CHEST TRAUMA       -ARRYHTHMIAS       -LATE PRESENTING MYOCARDIAL INFARCTION       -COPD   CLINICAL FOLLOW-UP RECOMMENDED.    Dg Chest 1 View  10/26/2015  CLINICAL DATA:  Shortness of breath EXAM: CHEST 1 VIEW COMPARISON:  09/24/2015 FINDINGS: Chronic cardiomegaly. Stable aortic and hilar contours. Obscured left diaphragm with increased opacity, accentuated by underpenetration. No edema, effusion, or pneumothorax. IMPRESSION: 1. Retrocardiac atelectasis or pneumonia. 2. Cardiomegaly without failure. Electronically Signed   By: Monte Fantasia M.D.   On: 10/26/2015 12:40   Ct Angio Chest Pe W/cm &/or Wo Cm  10/26/2015  CLINICAL DATA:  60 year old with sudden onset of shortness of breath and elevated D-dimer. Paralyzed patient. EXAM: CT ANGIOGRAPHY CHEST WITH CONTRAST TECHNIQUE: Multidetector CT imaging of the chest was performed using the standard protocol during bolus administration of intravenous contrast. Multiplanar CT image reconstructions and MIPs were obtained to evaluate the vascular anatomy. CONTRAST:  100 mL Isovue 370 COMPARISON:  Chest CT 03/07/2015 FINDINGS: Mediastinum/Lymph Nodes: Positive for bilateral pulmonary emboli. There is clot extending into the  segmental branches in left upper lobe and left lower lobar artery. Clot extending into the left lower lobe segmental branches. There is clot in the proximal right lower lobe branches and there may be a small amount of clot in the proximal right middle lobe branch. There is no significant enlargement of the right  ventricle. No evidence to suggest right heart strain. There are coronary artery calcifications. No significant pericardial fluid. Evaluation for chest lymphadenopathy is limited but no significant chest lymphadenopathy. Evidence for subcutaneous edema throughout the chest. Lungs/Pleura: Small to moderate sized bilateral pleural effusions, right side greater than left. Pleural effusions are slightly smaller compared to the study in 2016. There is volume loss and atelectasis in the left lower lobe. The trachea and mainstem bronchi are patent. 4 mm nodule in the anterior right upper lobe on sequence 507, image 29 is essentially stable since 03/07/2015. Upper abdomen: Contrast refluxes into the hepatic veins. Limited evaluation of the upper abdominal structures. Musculoskeletal: Striated appearance of the vertebral bodies probably related to osteopenia and underlying paralysis. Vertebral body heights are maintained. Some motion artifact in the sternal region. Review of the MIP images confirms the above findings. IMPRESSION: Positive for bilateral pulmonary emboli. Moderate clot burden. No evidence for right heart strain. Small to moderate sized bilateral pleural effusions with atelectasis or scarring at the left lung base. 4 mm nodule in the right upper lobe has not significantly changed since 09/18 11/2014. This small nodule is indeterminate. No follow-up needed if patient is low-risk. Non-contrast chest CT can be considered in 12 months if patient is high-risk. This recommendation follows the consensus statement: Guidelines for Management of Incidental Pulmonary Nodules Detected on CT Images:From the  Fleischner Society 2017; published online before print (10.1148/[id number]). Electronically Signed   By: Markus Daft M.D.   On: 10/26/2015 17:38    Review of Systems  Constitutional: Positive for malaise/fatigue. Negative for fever and chills.  Eyes: Negative for double vision.  Respiratory: Positive for shortness of breath. Negative for hemoptysis and sputum production.   Cardiovascular: Positive for leg swelling. Negative for chest pain and palpitations.  Gastrointestinal: Negative for nausea and vomiting.  Genitourinary: Positive for dysuria.  Neurological: Positive for weakness. Negative for dizziness and headaches.    Blood pressure 146/98, pulse 72, temperature 98.3 F (36.8 C), temperature source Rectal, resp. rate 20, SpO2 94 %. Physical Exam  Constitutional: She is oriented to person, place, and time.  HENT:  Head: Normocephalic and atraumatic.  Eyes: Conjunctivae are normal. Pupils are equal, round, and reactive to light. Left eye exhibits no discharge.  Neck: Normal range of motion. Neck supple. No JVD present. No tracheal deviation present. Thyromegaly present.  Cardiovascular: Normal rate and regular rhythm.   Murmur (Soft systolic murmur noted no S3 gallop) heard. Respiratory:  Decreased breath sound at bases  GI: Soft. Bowel sounds are normal. She exhibits no distension. There is no rebound.  Musculoskeletal:  No clubbing cyanosis 1+ edema noted  Neurological: She is alert and oriented to person, place, and time.     Assessment/Plan Acute bilateral pulmonary embolism Minimally elevated troponin I secondary to about doubt MI Chronic leg swelling rule out DVT Hypertension Type 2 diabetes mellitus Multiple sclerosis Chronic decubitus ulcers Plan As per orders Continue IV heparin Check 2-D echo check for RV and LV function Check duplex ultrasound of lower extremities Check serial EKG and troponin Charolette Forward, MD 10/26/2015, 7:19 PM

## 2015-10-26 NOTE — Progress Notes (Signed)
ANTICOAGULATION CONSULT NOTE - Initial Consult  Pharmacy Consult for Heparin  Indication: New bilateral pulmonary embolism   Allergies  Allergen Reactions  . Sulfa Antibiotics Shortness Of Breath and Swelling    Patient Measurements:   Heparin Dosing Weight: 64 kg   Vital Signs: Temp: 98.3 F (36.8 C) (05/09 1127) Temp Source: Rectal (05/09 1127) BP: 147/96 mmHg (05/09 1730) Pulse Rate: 100 (05/09 1730)  Labs:  Recent Labs  10/26/15 1155 10/26/15 1658  HGB 15.8*  --   HCT 49.2*  --   PLT 456*  --   CREATININE 0.46  --   TROPONINI 0.08* 0.09*    CrCl cannot be calculated (Unknown ideal weight.).   Medical History: Past Medical History  Diagnosis Date  . MS (multiple sclerosis) ([place])   . HTN (hypertension)   . DM II (diabetes mellitus, type II), controlled ([place])   . Lymphedema   . Anemia   . Gait disorder     Medications:   (Not in a hospital admission)  Assessment: 45 YOF with new bilateral pulmonary embolism with no heart strain. Pharmacy consulted to start IV heparin. She was not on anticoagulation prior to admission. H/H 15.8/49.2, Plt 456K  Goal of Therapy:  Heparin level 0.3-0.7 units/ml Monitor platelets by anticoagulation protocol: Yes   Plan:  -IV heparin 4000 units bolus followed by heparin infusion at 1100 units/hr  -F/u 6 hr HL -Monitor daily HL, CBC and s/s of bleeding   Albertina Parr, PharmD., BCPS Clinical Pharmacist Pager [phone]

## 2015-10-26 NOTE — Progress Notes (Signed)
VASCULAR LAB PRELIMINARY  PRELIMINARY  PRELIMINARY  PRELIMINARY  Bilateral lower extremity venous duplex completed.    Preliminary report:  Technically difficult and limited due suboptimal  patient position. Doppler waveforms are pulsatile throughout bilaterally Right - Positive for DVT noted in the common femoral, femoral, and popliteal veins . Left - No obvious evidence of DVT. Bilateral - no obvious evidence of superficial thrombosis or Baker's cyst.  Siarra Gilkerson, RVTS 10/26/2015, 8:01 PM

## 2015-10-26 NOTE — ED Notes (Signed)
Dr. Reather Converse notified of lab results.

## 2015-10-26 NOTE — ED Notes (Signed)
Called IV team and made aware of need for STAT IV for CT angio. States would start line on Peds pt then coming down

## 2015-10-27 ENCOUNTER — Inpatient Hospital Stay (HOSPITAL_COMMUNITY): Payer: Medicare Other

## 2015-10-27 DIAGNOSIS — L8990 Pressure ulcer of unspecified site, unspecified stage: Secondary | ICD-10-CM | POA: Insufficient documentation

## 2015-10-27 LAB — URINALYSIS, ROUTINE W REFLEX MICROSCOPIC
GLUCOSE, UA: NEGATIVE mg/dL
Ketones, ur: 15 mg/dL — AB
Nitrite: NEGATIVE
pH: 6 (ref 5.0–8.0)

## 2015-10-27 LAB — TROPONIN I
TROPONIN I: 0.08 ng/mL — AB (ref <0.031)
TROPONIN I: 0.09 ng/mL — AB (ref <0.031)

## 2015-10-27 LAB — CBC
HEMATOCRIT: 43 % (ref 36.0–46.0)
HEMOGLOBIN: 13.7 g/dL (ref 12.0–15.0)
MCH: 24.2 pg — ABNORMAL LOW (ref 26.0–34.0)
MCHC: 31.9 g/dL (ref 30.0–36.0)
MCV: 75.8 fL — AB (ref 78.0–100.0)
Platelets: 410 10*3/uL — ABNORMAL HIGH (ref 150–400)
RBC: 5.67 MIL/uL — ABNORMAL HIGH (ref 3.87–5.11)
RDW: 19.5 % — ABNORMAL HIGH (ref 11.5–15.5)
WBC: 8 10*3/uL (ref 4.0–10.5)

## 2015-10-27 LAB — BASIC METABOLIC PANEL
ANION GAP: 14 (ref 5–15)
BUN: 14 mg/dL (ref 6–20)
CHLORIDE: 110 mmol/L (ref 101–111)
CO2: 17 mmol/L — AB (ref 22–32)
Calcium: 7.7 mg/dL — ABNORMAL LOW (ref 8.9–10.3)
Creatinine, Ser: 0.48 mg/dL (ref 0.44–1.00)
GFR calc Af Amer: >60 mL/min (ref >60)
Glucose, Bld: 173 mg/dL — ABNORMAL HIGH (ref 65–99)
POTASSIUM: 5.4 mmol/L — AB (ref 3.5–5.1)
Sodium: 141 mmol/L (ref 135–145)

## 2015-10-27 LAB — GLUCOSE, CAPILLARY
GLUCOSE-CAPILLARY: 138 mg/dL — AB (ref 65–99)
GLUCOSE-CAPILLARY: 173 mg/dL — AB (ref 65–99)
GLUCOSE-CAPILLARY: 216 mg/dL — AB (ref 65–99)
Glucose-Capillary: 158 mg/dL — ABNORMAL HIGH (ref 65–99)

## 2015-10-27 LAB — URINE MICROSCOPIC-ADD ON

## 2015-10-27 LAB — LIPID PANEL
CHOL/HDL RATIO: 11 ratio
Cholesterol: 241 mg/dL — ABNORMAL HIGH (ref 0–200)
HDL: 22 mg/dL — AB (ref >40)
LDL Cholesterol: 191 mg/dL — ABNORMAL HIGH (ref 0–99)
Triglycerides: 138 mg/dL (ref <150)
VLDL: 28 mg/dL (ref 0–40)

## 2015-10-27 LAB — HEPARIN LEVEL (UNFRACTIONATED): HEPARIN UNFRACTIONATED: 0.21 [IU]/mL — AB (ref 0.30–0.70)

## 2015-10-27 MED ORDER — APIXABAN 5 MG PO TABS: 5.0000 mg | ORAL_TABLET | Freq: Two times a day (BID) | ORAL | Status: DC

## 2015-10-27 MED ORDER — SODIUM CHLORIDE 0.9 % IV SOLN
INTRAVENOUS | Status: DC
  Administered 2015-10-27 – 2015-10-28 (×3): via INTRAVENOUS

## 2015-10-27 MED ORDER — SODIUM CHLORIDE 0.9 % IV BOLUS (SEPSIS)
250.0000 mL | Freq: Once | INTRAVENOUS | Status: AC
Start: 2015-10-27 — End: 2015-10-27
  Administered 2015-10-27: 250 mL via INTRAVENOUS

## 2015-10-27 MED ORDER — LORAZEPAM 0.5 MG PO TABS
0.5000 mg | ORAL_TABLET | Freq: Every day | ORAL | Status: DC | PRN
  Administered 2015-10-27 – 2015-10-31 (×5): 0.5 mg via ORAL
  Filled 2015-10-27 (×5): qty 1

## 2015-10-27 MED ORDER — HEPARIN BOLUS VIA INFUSION
2000.0000 [IU] | Freq: Once | INTRAVENOUS | Status: AC
  Administered 2015-10-27: 2000 [IU] via INTRAVENOUS
  Filled 2015-10-27: qty 2000

## 2015-10-27 MED ORDER — CIPROFLOXACIN HCL 500 MG PO TABS
500.0000 mg | ORAL_TABLET | Freq: Two times a day (BID) | ORAL | Status: DC
  Administered 2015-10-27 – 2015-10-29 (×5): 500 mg via ORAL
  Filled 2015-10-27 (×5): qty 1

## 2015-10-27 MED ORDER — APIXABAN 5 MG PO TABS
10.0000 mg | ORAL_TABLET | Freq: Two times a day (BID) | ORAL | Status: DC
  Administered 2015-10-27 – 2015-10-31 (×9): 10 mg via ORAL
  Filled 2015-10-27 (×9): qty 2

## 2015-10-27 NOTE — Progress Notes (Signed)
Subjective:  Patient denies any chest pain states breathing has improved. Duplex ultrasound of lower extremity positive for DVT  Objective:  Vital Signs in the last 24 hours: Temp:  [96.5 F (35.8 C)-98.4 F (36.9 C)] 98.4 F (36.9 C) (05/10 0429) Pulse Rate:  [72-107] 89 (05/10 0429) Resp:  [14-30] 16 (05/10 0429) BP: (129-158)/(75-120) 129/81 mmHg (05/10 0429) SpO2:  [94 %-100 %] 100 % (05/10 0720) FiO2 (%):  [0 %] 0 % (05/09 2110)  Intake/Output from previous day: 05/09 0701 - 05/10 0700 In: -  Out: 600 [Urine:600] Intake/Output from this shift:    Physical Exam: Neck: no adenopathy, no carotid bruit, no JVD and supple, symmetrical, trachea midline Lungs: Decreased breath sound at bases Heart: regular rate and rhythm, S1, S2 normal and Systolic murmur noted Abdomen: soft, non-tender; bowel sounds normal; no masses,  no organomegaly Extremities: No clubbing cyanosis 1+ edema noted  Lab Results:  Recent Labs  10/26/15 1155 10/27/15 0220  WBC 6.1 8.0  HGB 15.8* 13.7  PLT 456* 410*    Recent Labs  10/26/15 1155 10/27/15 0220  NA 141 141  K 4.0 5.4*  CL 109 110  CO2 18* 17*  GLUCOSE 210* 173*  BUN 14 14  CREATININE 0.46 0.48    Recent Labs  10/26/15 2135 10/27/15 0220  TROPONINI 0.10* 0.08*   Hepatic Function Panel No results for input(s): PROT, ALBUMIN, AST, ALT, ALKPHOS, BILITOT, BILIDIR, IBILI in the last 72 hours.  Recent Labs  10/27/15 0220  CHOL 241*   No results for input(s): PROTIME in the last 72 hours.  Imaging: Imaging results have been reviewed and Dg Chest 1 View  10/26/2015  CLINICAL DATA:  Shortness of breath EXAM: CHEST 1 VIEW COMPARISON:  09/24/2015 FINDINGS: Chronic cardiomegaly. Stable aortic and hilar contours. Obscured left diaphragm with increased opacity, accentuated by underpenetration. No edema, effusion, or pneumothorax. IMPRESSION: 1. Retrocardiac atelectasis or pneumonia. 2. Cardiomegaly without failure. Electronically  Signed   By: Monte Fantasia M.D.   On: 10/26/2015 12:40   Ct Angio Chest Pe W/cm &/or Wo Cm  10/26/2015  CLINICAL DATA:  60 year old with sudden onset of shortness of breath and elevated D-dimer. Paralyzed patient. EXAM: CT ANGIOGRAPHY CHEST WITH CONTRAST TECHNIQUE: Multidetector CT imaging of the chest was performed using the standard protocol during bolus administration of intravenous contrast. Multiplanar CT image reconstructions and MIPs were obtained to evaluate the vascular anatomy. CONTRAST:  100 mL Isovue 370 COMPARISON:  Chest CT 03/07/2015 FINDINGS: Mediastinum/Lymph Nodes: Positive for bilateral pulmonary emboli. There is clot extending into the segmental branches in left upper lobe and left lower lobar artery. Clot extending into the left lower lobe segmental branches. There is clot in the proximal right lower lobe branches and there may be a small amount of clot in the proximal right middle lobe branch. There is no significant enlargement of the right ventricle. No evidence to suggest right heart strain. There are coronary artery calcifications. No significant pericardial fluid. Evaluation for chest lymphadenopathy is limited but no significant chest lymphadenopathy. Evidence for subcutaneous edema throughout the chest. Lungs/Pleura: Small to moderate sized bilateral pleural effusions, right side greater than left. Pleural effusions are slightly smaller compared to the study in 2016. There is volume loss and atelectasis in the left lower lobe. The trachea and mainstem bronchi are patent. 4 mm nodule in the anterior right upper lobe on sequence 507, image 29 is essentially stable since 03/07/2015. Upper abdomen: Contrast refluxes into the hepatic veins. Limited evaluation  of the upper abdominal structures. Musculoskeletal: Striated appearance of the vertebral bodies probably related to osteopenia and underlying paralysis. Vertebral body heights are maintained. Some motion artifact in the sternal  region. Review of the MIP images confirms the above findings. IMPRESSION: Positive for bilateral pulmonary emboli. Moderate clot burden. No evidence for right heart strain. Small to moderate sized bilateral pleural effusions with atelectasis or scarring at the left lung base. 4 mm nodule in the right upper lobe has not significantly changed since 09/18 11/2014. This small nodule is indeterminate. No follow-up needed if patient is low-risk. Non-contrast chest CT can be considered in 12 months if patient is high-risk. This recommendation follows the consensus statement: Guidelines for Management of Incidental Pulmonary Nodules Detected on CT Images:From the Fleischner Society 2017; published online before print (10.1148/radiol.SG:[id number]). Electronically Signed   By: Markus Daft M.D.   On: 10/26/2015 17:38    Cardiac Studies:  Assessment/Plan:  Acute bilateral pulmonary embolism Minimally elevated troponin I secondary to about doubt MI Right leg DVT Hypertension Type 2 diabetes mellitus Multiple sclerosis Chronic decubitus ulcers Plan Plan Change IV heparin 2 Eliquis Check 2-D echo   LOS: 1 day    Charolette Forward 10/27/2015, 10:51 AM

## 2015-10-27 NOTE — Progress Notes (Signed)
Dr. Cleon Dew notified that pt urine production minimal, 50cc since new foley placed 4hrs ago.  Received orders to administer NS fluid bolus and then continuous at 50cc/hr

## 2015-10-27 NOTE — Progress Notes (Signed)
ANTICOAGULATION CONSULT NOTE - Follow Up Consult  Pharmacy Consult for Heparin  Indication: pulmonary embolus and DVT  Allergies  Allergen Reactions  . Sulfa Antibiotics Shortness Of Breath and Swelling   Vital Signs: BP: 133/84 mmHg (05/09 2342) Pulse Rate: 107 (05/09 2201)  Labs:  Recent Labs  10/26/15 1155 10/26/15 1658 10/26/15 2135 10/27/15 0220  HGB 15.8*  --   --  13.7  HCT 49.2*  --   --  43.0  PLT 456*  --   --  410*  APTT  --   --  76*  --   LABPROT  --   --  16.1*  --   INR  --   --  1.28  --   HEPARINUNFRC  --   --   --  0.21*  CREATININE 0.46  --   --   --   TROPONINI 0.08* 0.09* 0.10*  --    CrCl cannot be calculated (Unknown ideal weight.).  Assessment: Heparin for new onset DVT/PE, initial heparin level is low at 0.21, no issues per RN.   Goal of Therapy:  Heparin level 0.3-0.7 units/ml Monitor platelets by anticoagulation protocol: Yes   Plan:  -Heparin 2000 units BOLUS  -Increase heparin drip to 1300 units/hr -1100 HL  Chandra Feger 10/27/2015,3:04 AM

## 2015-10-27 NOTE — Discharge Instructions (Addendum)
Information on my medicine - ELIQUIS (apixaban)  This medication education was reviewed with me or my healthcare representative as part of my discharge preparation.  The pharmacist that spoke with me during my hospital stay was:  Romona Curls, [facility]  Why was Eliquis prescribed for you? Eliquis was prescribed to treat blood clots that may have been found in the veins of your legs (deep vein thrombosis) or in your lungs (pulmonary embolism) and to reduce the risk of them occurring again.  What do You need to know about Eliquis ? The starting dose is 10 mg (two 5 mg tablets) taken TWICE daily for the FIRST SEVEN (7) DAYS, then on (enter date)  11/03/2015  the dose is reduced to ONE 5 mg tablet taken TWICE daily.  Eliquis may be taken with or without food.   Try to take the dose about the same time in the morning and in the evening. If you have difficulty swallowing the tablet whole please discuss with your pharmacist how to take the medication safely.  Take Eliquis exactly as prescribed and DO NOT stop taking Eliquis without talking to the doctor who prescribed the medication.  Stopping may increase your risk of developing a new blood clot.  Refill your prescription before you run out.  After discharge, you should have regular check-up appointments with your healthcare provider that is prescribing your Eliquis.    What do you do if you miss a dose? If a dose of ELIQUIS is not taken at the scheduled time, take it as soon as possible on the same day and twice-daily administration should be resumed. The dose should not be doubled to make up for a missed dose.  Important Safety Information A possible side effect of Eliquis is bleeding. You should call your healthcare provider right away if you experience any of the following: ? Bleeding from an injury or your nose that does not stop. ? Unusual colored urine (red or dark brown) or unusual colored stools (red or black). ? Unusual bruising for  unknown reasons. ? A serious fall or if you hit your head (even if there is no bleeding).  Some medicines may interact with Eliquis and might increase your risk of bleeding or clotting while on Eliquis. To help avoid this, consult your healthcare provider or pharmacist prior to using any new prescription or non-prescription medications, including herbals, vitamins, non-steroidal anti-inflammatory drugs (NSAIDs) and supplements.  This website has more information on Eliquis (apixaban): http://www.eliquis.com/eliquis/home  Pulmonary Embolism A pulmonary embolism (PE) is a sudden blockage or decrease of blood flow in one lung or both lungs. Most blockages come from a blood clot that travels from the legs or the pelvis to the lungs. PE is a dangerous and potentially life-threatening condition if it is not treated right away. CAUSES A pulmonary embolism occurs most commonly when a blood clot travels from one of your veins to your lungs. Rarely, PE is caused by air, fat, amniotic fluid, or part of a tumor traveling through your veins to your lungs. RISK FACTORS A PE is more likely to develop in:  People who smoke.  People who areolder, especially over 38 years of age.  People who are overweight (obese).  People who sit or lie still for a long time, such as during long-distance travel (over 4 hours), bed rest, hospitalization, or during recovery from certain medical conditions like a stroke.  People who do not engage in much physical activity (sedentary lifestyle).  People who have chronic  breathing disorders.  People whohave a personal or family history of blood clots or blood clotting disease.  People whohave peripheral vascular disease (PVD), diabetes, or some types of cancer.  People who haveheart disease, especially if the person had a recent heart attack or has congestive heart failure.  People who have neurological diseases that affect the legs (leg paresis).  People who have  had a traumatic injury, such as breaking a hip or leg.  People whohave recently had major or lengthy surgery, especially on the hip, knee, or abdomen.  People who have hada central line placed inside a large vein.  People who takemedicines that contain the hormone estrogen. These include birth control pills and hormone replacement therapy.  Pregnancy or during childbirth or the postpartum period. SIGNS AND SYMPTOMS  The symptoms of a PE usually start suddenly and include:  Shortness of breath while active or at rest.  Coughing or coughing up blood or blood-tinged mucus.  Chest pain that is often worse with deep breaths.  Rapid or irregular heartbeat.  Feeling light-headed or dizzy.  Fainting.  Feelinganxious.  Sweating. There may also be pain and swelling in a leg if that is where the blood clot started. These symptoms may represent a serious problem that is an emergency. Do not wait to see if the symptoms will go away. Get medical help right away. Call your local emergency services (911 in the [place] Do not drive yourself to the hospital. DIAGNOSIS Your health care provider will take a medical history and perform a physical exam. You may also have other tests, including:  Blood tests to assess the clotting properties of your blood, assess oxygen levels in your blood, and find blood clots.  Imaging tests, such as CT, ultrasound, MRI, X-ray, and other tests to see if you have clots anywhere in your body.  An electrocardiogram (ECG) to look for heart strain from blood clots in the lungs. TREATMENT The main goals of PE treatment are:  To stop a blood clot from growing larger.  To stop new blood clots from forming. The type of treatment that you receive depends on many factors, such as the cause of your PE, your risk for bleeding or developing more clots, and other medical conditions that you have. Sometimes, a combination of treatments is necessary. This condition may be  treated with:  Medicines, including newer oral blood thinners (anticoagulants), warfarin, low molecular weight heparins, thrombolytics, or heparins.  Wearing compression stockings or using different types of devices.  Surgery (rare) to remove the blood clot or to place a filter in your abdomen to stop the blood clot from traveling to your lungs. Treatments for a PE are often divided into immediate treatment, long-term treatment (up to 3 months after PE), and extended treatment (more than 3 months after PE). Your treatment may continue for several months. This is called maintenance therapy, and it is used to prevent the forming of new blood clots. You can work with your health care provider to choose the treatment program that is best for you. What are anticoagulants? Anticoagulants are medicines that treat PEs. They can stop current blood clots from growing and stop new clots from forming. They cannot dissolve existing clots. Your body dissolves clots by itself over time. Anticoagulants are given by mouth, by injection, or through an IV tube. What are thrombolytics? Thrombolytics are clot-dissolving medicines that are used to dissolve a PE. They carry a high risk of bleeding, so they tend to be used only  in severe cases or if you have very low blood pressure. HOME CARE INSTRUCTIONS If you are taking a newer oral anticoagulant:  Take the medicine every single day at the same time each day.  Understand what foods and drugs interact with this medicine.  Understand that there are no regular blood tests required when using this medicine.  Understandthe side effects of this medicine, including excessive bruising or bleeding. Ask your health care provider or pharmacist about other possible side effects. If you are taking warfarin:  Understand how to take warfarin and know which foods can affect how warfarin works in [employer] account [employer].  Understand that it is dangerous to taketoo much or too little  warfarin. Too much warfarin increases the risk of bleeding. Too little warfarin continues to allow the risk for blood clots.  Follow your PT and INR blood testing schedule. The PT and INR results allow your health care provider to adjust your dose of warfarin. It is very important that you have your PT and INR tested as often as told by your health care provider.  Avoid major changes in your diet, or tell your health care provider before you change your diet. Arrange a visit with a registered dietitian to answer your questions. Many foods, especially foods that are high in vitamin K, can interfere with warfarin and affect the PT and INR results. Eat a consistent amount of foods that are high in vitamin K, such as:  Spinach, kale, broccoli, cabbage, collard greens, turnip greens, Brussels sprouts, peas, cauliflower, seaweed, and parsley.  Beef liver and pork liver.  Green tea.  Soybean oil.  Tell your health care provider about any and all medicines, vitamins, and supplements that you take, including aspirin and other over-the-counter anti-inflammatory medicines. Be especially cautious with aspirin and anti-inflammatory medicines. Do not take those before you ask your health care provider if it is safe to do so. This is important because many medicines can interfere with warfarin and affect the PT and INR results.  Do not start or stop taking any over-the-counter or prescription medicine unless your health care provider or pharmacist tells you to do so. If you take warfarin, you will also need to do these things:  Hold pressure over cuts for longer than usual.  Tell your dentist and other health care providers that you are taking warfarin before you have any procedures in which bleeding may occur.  Avoid alcohol or drink very small amounts. Tell your health care provider if you change your alcohol intake.  Do not use tobacco products, including cigarettes, chewing tobacco, and e-cigarettes.  If you need help quitting, ask your health care provider.  Avoid contact sports. General Instructions  Take over-the-counter and prescription medicines only as told by your health care provider. Anticoagulant medicines can have side effects, including easy bruising and difficulty stopping bleeding. If you are prescribed an anticoagulant, you will also need to do these things:  Hold pressure over cuts for longer than usual.  Tell your dentist and other health care providers that you are taking anticoagulants before you have any procedures in which bleeding may occur.  Avoid contact sports.  Wear a medical alert bracelet or carry a medical alert card that says you have had a PE.  Ask your health care provider how soon you can go back to your normal activities. Stay active to prevent new blood clots from forming.  Make sure to exercise while traveling or when you have been sitting or standing for a  long period of time. It is very important to exercise. Exercise your legs by walking or by tightening and relaxing your leg muscles often. Take frequent walks.  Wear compression stockings as told by your health care provider to help prevent more blood clots from forming.  Do not use tobacco products, including cigarettes, chewing tobacco, and e-cigarettes. If you need help quitting, ask your health care provider.  Keep all follow-up appointments with your health care provider. This is important. PREVENTION Take these actions to decrease your risk of developing another PE:  Exercise regularly. For at least 30 minutes every day, engage in:  Activity that involves moving your arms and legs.  Activity that encourages good blood flow through your body by increasing your heart rate.  Exercise your arms and legs every hour during long-distance travel (over 4 hours). Drink plenty of water and avoid drinking alcohol while traveling.  Avoid sitting or lying in bed for long periods of time without  moving your legs.  Maintain a weight that is appropriate for your height. Ask your health care provider what weight is healthy for you.  If you are a woman who is over 87 years of age, avoid unnecessary use of medicines that contain estrogen. These include birth control pills.  Do not smoke, especially if you take estrogen medicines. If you need help quitting, ask your health care provider.  If you are at very high risk for PE, wear compression stockings.  If you recently had a PE, have regularly scheduled ultrasound testing on your legs to check for new blood clots. If you are hospitalized, prevention measures may include:  Early walking after surgery, as soon as your health care provider says that it is safe.  Receiving anticoagulants to prevent blood clots. If you cannot take anticoagulants, other options may be available, such as wearing compression stockings or using different types of devices. SEEK IMMEDIATE MEDICAL CARE IF:  You have new or increased pain, swelling, or redness in an arm or leg.  You have numbness or tingling in an arm or leg.  You have shortness of breath while active or at rest.  You have chest pain.  You have a rapid or irregular heartbeat.  You feel light-headed or dizzy.  You cough up blood.  You notice blood in your vomit, bowel movement, or urine.  You have a fever. These symptoms may represent a serious problem that is an emergency. Do not wait to see if the symptoms will go away. Get medical help right away. Call your local emergency services (911 in the [place] Do not drive yourself to the hospital.   This information is not intended to replace advice given to you by your health care provider. Make sure you discuss any questions you have with your health care provider.   Document Released: 06/02/2000 Document Revised: 02/24/2015 Document Reviewed: 09/30/2014 Elsevier Interactive Patient Education [employer].

## 2015-10-27 NOTE — Progress Notes (Signed)
ANTICOAGULATION CONSULT NOTE - Follow Up Consult  Pharmacy Consult for Heparin > Apixaban Indication: pulmonary embolus and DVT  Allergies  Allergen Reactions  . Sulfa Antibiotics Shortness Of Breath and Swelling   Vital Signs: Temp: 98.4 F (36.9 C) (05/10 0429) Temp Source: Oral (05/10 0429) BP: 129/81 mmHg (05/10 0429) Pulse Rate: 89 (05/10 0429)  Labs:  Recent Labs  10/26/15 1155 10/26/15 1658 10/26/15 2135 10/27/15 0220  HGB 15.8*  --   --  13.7  HCT 49.2*  --   --  43.0  PLT 456*  --   --  410*  APTT  --   --  76*  --   LABPROT  --   --  16.1*  --   INR  --   --  1.28  --   HEPARINUNFRC  --   --   --  0.21*  CREATININE 0.46  --   --  0.48  TROPONINI 0.08* 0.09* 0.10* 0.08*   CrCl cannot be calculated (Unknown ideal weight.).  Assessment: 33 YOF with new bilateral pulmonary embolism with no heart strain, RLE DVT+ on dopplers. Pharmacy consulted to transition from IV heparin to apixaban. She was not on anticoagulation prior to admission. CBC ok. No bleed documented. Communicated plans with RN.  Goal of Therapy:  Heparin level 0.3-0.7 units/ml Monitor platelets by anticoagulation protocol: Yes   Plan:  -Hep IV >> Apixaban 10mg  bid x 7 days; then 5mg  bid (start 5/17)  -Turn off heparin gtt when 1st dose of apixaban given - communicated w/ RN  -Monitor CBC, s/sx bleeding   Elicia Lamp, PharmD, BCPS Clinical Pharmacist Pager [phone] 10/27/2015 10:13 AM

## 2015-10-27 NOTE — Progress Notes (Signed)
Foley replaced due to the one pt came in with not draining well - sediment build up in the tubing/drainage bag connection.  Scant amount of urine noted upon insertion of new foley catheter.  Will monitor.

## 2015-10-27 NOTE — Progress Notes (Signed)
MD notified that pt urine has strong foul odor, sediment accumulating in foley tubing.  Pt had foley on admission she states placed due to incontinence and sacral/buttocks skin breakdown.

## 2015-10-27 NOTE — Consult Note (Addendum)
WOC wound consult note Reason for Consult: Consult requested for sacrum.  Pt familiar to [place] from previous admission. Sacrum with chronic stage 4 pressure injury which has improved since previous assessment.   1X3X1cm with 1 cm undermining to wound edges.  Bone palpable when probed with a swab. Intact skin surrounding wound and scar tissue to right buttock where another wound was previously located and has healed. Difficult to assess inner wound; appears to be beefy red, mod amt green-tinged drainage, no odor. Right outer leg with healing full thickness wound; 1X1X.1cm, pink and dry, no odor or drainage. Plan:  Foam dressing to leg to promote healing.  Aquacel to sacrum to absorb drainage and provide antimicrobial benifits. Air mattress to reduce pressure. Discussed plan of care with patient and she verbalized understanding Please re-consult if further assistance is needed.  Thank-you,  Julien Girt MSN, [place], [place], [place], [place]

## 2015-10-28 ENCOUNTER — Inpatient Hospital Stay (HOSPITAL_COMMUNITY): Payer: Medicare Other

## 2015-10-28 LAB — GLUCOSE, CAPILLARY
GLUCOSE-CAPILLARY: 162 mg/dL — AB (ref 65–99)
GLUCOSE-CAPILLARY: 199 mg/dL — AB (ref 65–99)
GLUCOSE-CAPILLARY: 206 mg/dL — AB (ref 65–99)
Glucose-Capillary: 146 mg/dL — ABNORMAL HIGH (ref 65–99)

## 2015-10-28 LAB — CBC
HCT: 43.7 % (ref 36.0–46.0)
Hemoglobin: 13.9 g/dL (ref 12.0–15.0)
MCH: 24.5 pg — ABNORMAL LOW (ref 26.0–34.0)
MCHC: 31.8 g/dL (ref 30.0–36.0)
MCV: 76.9 fL — AB (ref 78.0–100.0)
PLATELETS: 467 10*3/uL — AB (ref 150–400)
RBC: 5.68 MIL/uL — AB (ref 3.87–5.11)
RDW: 19.8 % — ABNORMAL HIGH (ref 11.5–15.5)
WBC: 7.8 10*3/uL (ref 4.0–10.5)

## 2015-10-28 MED ORDER — PRO-STAT SUGAR FREE PO LIQD
30.0000 mL | Freq: Two times a day (BID) | ORAL | Status: DC
  Administered 2015-10-28 – 2015-10-31 (×4): 30 mL via ORAL
  Filled 2015-10-28 (×5): qty 30

## 2015-10-28 MED ORDER — ZOLPIDEM TARTRATE 5 MG PO TABS
5.0000 mg | ORAL_TABLET | Freq: Every evening | ORAL | Status: DC | PRN
  Administered 2015-10-28 (×2): 5 mg via ORAL
  Filled 2015-10-28 (×2): qty 1

## 2015-10-28 MED ORDER — ENSURE ENLIVE PO LIQD
237.0000 mL | Freq: Two times a day (BID) | ORAL | Status: DC
  Administered 2015-10-28 – 2015-10-31 (×4): 237 mL via ORAL

## 2015-10-28 NOTE — Care Management Note (Signed)
Case Management Note Marvetta Gibbons RN, BSN Unit 2W-Case Manager [phone]  Patient Details  Name: Jocelyn Sanchez MRN: [id number] Date of Birth: 12/08/1955  Subjective/Objective:   Pt admitted with PE                 Action/Plan: PTA pt lived at home with family- is active with [facility] for [place], will need resumption order at discharge for [place]- per conversation with pt at bedside- she also has an aide that comes from [facility] 8-2 and 5-8 daily, pt reports that she has all needed DME at home including hospital bed and hoyer lift. Per pt she transports home via non-emergent EMS- will arrange EMS transport when pt ready for d/c home. Pt started on Eliquis- insurance check completed- Per rep at [facility]:  Patient has no deductible/ and $0 co-pay for all medications  Covered, no auth required --  Coverage confirmed with pt- pt given 30 day free trial card to use if she wants as she already has $0 copay-   Expected Discharge Date:                  Expected Discharge Plan:  [place]  In-House Referral:     Discharge planning Services  CM Consult  Post Acute Care Choice:  [place], Resumption of Svcs/PTA Provider Choice offered to:  Patient  DME Arranged:    DME Agency:     HH Arranged:  RN [place] Agency:  [place]  Status of Service:  In process, will continue to follow  Medicare Important Message Given:    Date Medicare IM Given:    Medicare IM give by:    Date Additional Medicare IM Given:    Additional Medicare Important Message give by:     If discussed at [place] of Stay Meetings, dates discussed:    Additional Comments:  Dawayne Patricia, RN 10/28/2015, 2:54 PM

## 2015-10-28 NOTE — Progress Notes (Signed)
Initial Nutrition Assessment  DOCUMENTATION CODES:   Not applicable  INTERVENTION:  -Pro-Stat 67mL BID, each supplement provides 15g protein, and 100 calories -Ensure Enlive po BID, each supplement provides 350 kcal and 20 grams of protein  NUTRITION DIAGNOSIS:   Increased nutrient needs related to wound healing as evidenced by estimated needs.  GOAL:   Patient will meet greater than or equal to 90% of their needs  MONITOR:   PO intake, Supplement acceptance, Labs, I & O's, Skin  REASON FOR ASSESSMENT:   Low [place]    ASSESSMENT:   Patient is 60 year old female with past medical history significant for multiple sclerosis bedbound, type 2 diabetes mellitus, hypertension, chronic lower extremity lymphedema, anemia, chronic back ulcers, came to the ER by EMS complaining of progressive increasing shortness of breath for 3-4 weeks associated with feeling weak tired.   Spoke with Ms. Kinzler briefly at bedside. She endorses good appetite, no wt loss. Reported ht and wt of 5'3 - 146#. She does endorse having to stop eating this morning because "I got tired." Patient presented with pulmonary embolism that is currently being treated. She is currently on 1L [place] that she takes off frequently because she feels like "it isn't doing anything." Encouraged patient to take long deep breaths. Her appetite and wt are ok but patient does have a stg 4 wound to her medial sacrum that could use healing. Discussed with RN, will provide Pro-Stat BID, in addition to Ensure which patient requested "as long as it is cold."  Nutrition-Focused physical exam completed. Findings are no fat depletion, mild muscle depletion, and no edema.   Labs: CBGs 146-217; K 5.4; Ca 7.7 Medications reviewed.  Diet Order:  Diet Heart Room service appropriate?: Yes; Fluid consistency:: Thin  Skin:  Wound (see comment) (Stg IV to medial sacrum)  Last BM:  5/10  Height:   Ht Readings from Last 1 Encounters:  10/28/15 5'  3" (1.6 m)    Weight:   Wt Readings from Last 1 Encounters:  10/28/15 146 lb (66.225 kg)    Ideal Body Weight:  52.27 kg  BMI:  Body mass index is 25.87 kg/(m^2).  Estimated Nutritional Needs:   Kcal:  1600-2000 calories  Protein:  70-90 grams  Fluid:  >/= 1.6L  EDUCATION NEEDS:   No education needs identified at this time  Satira Anis. Adine Heimann, MS, RD LDN After Hours/Weekend Pager [phone]

## 2015-10-28 NOTE — Progress Notes (Signed)
Subjective:  Denies any chest pain.  States breathing is improved.  Patient has indwelling Foley catheter.  Urine dark and cloudy urine output remains minimal.  2-D echo.  Still pending.  Urine culture pending.  Objective:  Vital Signs in the last 24 hours: Temp:  [97.5 F (36.4 C)-97.6 F (36.4 C)] 97.6 F (36.4 C) (05/10 1946) Pulse Rate:  [90-91] 91 (05/11 0854) Resp:  [18] 18 (05/10 1946) BP: (127-138)/(92-103) 127/103 mmHg (05/11 0854) SpO2:  [98 %-99 %] 98 % (05/10 1946)  Intake/Output from previous day: 05/10 0701 - 05/11 0700 In: 453.3 [P.O.:120; I.V.:333.3] Out: 275 [Urine:275] Intake/Output from this shift: Total I/O In: 120 [P.O.:120] Out: -   Physical Exam: Neck: no adenopathy, no carotid bruit, no JVD and supple, symmetrical, trachea midline Lungs: decreased breath sounds at bases Heart: regular rate and rhythm, S1, S2 normal and soft systolic murmur noted Abdomen: soft, non-tender; bowel sounds normal; no masses,  no organomegaly Extremities: extremities normal, atraumatic, no cyanosis or edema  Lab Results:  Recent Labs  10/27/15 0220 10/28/15 0553  WBC 8.0 7.8  HGB 13.7 13.9  PLT 410* 467*    Recent Labs  10/26/15 1155 10/27/15 0220  NA 141 141  K 4.0 5.4*  CL 109 110  CO2 18* 17*  GLUCOSE 210* 173*  BUN 14 14  CREATININE 0.46 0.48    Recent Labs  10/27/15 0220 10/27/15 0947  TROPONINI 0.08* 0.09*   Hepatic Function Panel No results for input(s): PROT, ALBUMIN, AST, ALT, ALKPHOS, BILITOT, BILIDIR, IBILI in the last 72 hours.  Recent Labs  10/27/15 0220  CHOL 241*   No results for input(s): PROTIME in the last 72 hours.  Imaging: Imaging results have been reviewed and Dg Chest 1 View  10/26/2015  CLINICAL DATA:  Shortness of breath EXAM: CHEST 1 VIEW COMPARISON:  09/24/2015 FINDINGS: Chronic cardiomegaly. Stable aortic and hilar contours. Obscured left diaphragm with increased opacity, accentuated by underpenetration. No edema,  effusion, or pneumothorax. IMPRESSION: 1. Retrocardiac atelectasis or pneumonia. 2. Cardiomegaly without failure. Electronically Signed   By: Monte Fantasia M.D.   On: 10/26/2015 12:40   Ct Angio Chest Pe W/cm &/or Wo Cm  10/26/2015  CLINICAL DATA:  60 year old with sudden onset of shortness of breath and elevated D-dimer. Paralyzed patient. EXAM: CT ANGIOGRAPHY CHEST WITH CONTRAST TECHNIQUE: Multidetector CT imaging of the chest was performed using the standard protocol during bolus administration of intravenous contrast. Multiplanar CT image reconstructions and MIPs were obtained to evaluate the vascular anatomy. CONTRAST:  100 mL Isovue 370 COMPARISON:  Chest CT 03/07/2015 FINDINGS: Mediastinum/Lymph Nodes: Positive for bilateral pulmonary emboli. There is clot extending into the segmental branches in left upper lobe and left lower lobar artery. Clot extending into the left lower lobe segmental branches. There is clot in the proximal right lower lobe branches and there may be a small amount of clot in the proximal right middle lobe branch. There is no significant enlargement of the right ventricle. No evidence to suggest right heart strain. There are coronary artery calcifications. No significant pericardial fluid. Evaluation for chest lymphadenopathy is limited but no significant chest lymphadenopathy. Evidence for subcutaneous edema throughout the chest. Lungs/Pleura: Small to moderate sized bilateral pleural effusions, right side greater than left. Pleural effusions are slightly smaller compared to the study in 2016. There is volume loss and atelectasis in the left lower lobe. The trachea and mainstem bronchi are patent. 4 mm nodule in the anterior right upper lobe on sequence 507,  image 29 is essentially stable since 03/07/2015. Upper abdomen: Contrast refluxes into the hepatic veins. Limited evaluation of the upper abdominal structures. Musculoskeletal: Striated appearance of the vertebral bodies probably  related to osteopenia and underlying paralysis. Vertebral body heights are maintained. Some motion artifact in the sternal region. Review of the MIP images confirms the above findings. IMPRESSION: Positive for bilateral pulmonary emboli. Moderate clot burden. No evidence for right heart strain. Small to moderate sized bilateral pleural effusions with atelectasis or scarring at the left lung base. 4 mm nodule in the right upper lobe has not significantly changed since 09/18 11/2014. This small nodule is indeterminate. No follow-up needed if patient is low-risk. Non-contrast chest CT can be considered in 12 months if patient is high-risk. This recommendation follows the consensus statement: Guidelines for Management of Incidental Pulmonary Nodules Detected on CT Images:From the Fleischner Society 2017; published online before print (10.1148/radiol.SG:5268862). Electronically Signed   By: Markus Daft M.D.   On: 10/26/2015 17:38    Cardiac Studies:  Assessment/Plan:  Acute bilateral pulmonary embolism Minimally elevated troponin I secondary to about doubt MI Right leg DVT Hypertension Type 2 diabetes mellitus Multiple sclerosis Chronic decubitus ulcers UTI Plan As per orders. Check 2-D echo. Possible discharge tomorrow if stable  LOS: 2 days    Charolette Forward 10/28/2015, 10:08 AM

## 2015-10-28 NOTE — Progress Notes (Signed)
Dr. Terrence Dupont notified of minimal urine output today (125 mL). No new orders given. Will continue to monitor.

## 2015-10-28 NOTE — Progress Notes (Signed)
MD-urine output still low-see charting

## 2015-10-28 NOTE — Progress Notes (Signed)
Insurance check completed for Eliquis Per rep at [facility]:   Patient has no deductible/ and $0 co-pay for all medications   Covered, no auth required

## 2015-10-28 NOTE — Progress Notes (Signed)
  Echocardiogram 2D Echocardiogram has been performed.  Jocelyn Sanchez 10/28/2015, 12:55 PM

## 2015-10-29 ENCOUNTER — Ambulatory Visit (INDEPENDENT_AMBULATORY_CARE_PROVIDER_SITE_OTHER): Payer: Medicare Other | Admitting: *Deleted

## 2015-10-29 DIAGNOSIS — I2699 Other pulmonary embolism without acute cor pulmonale: Secondary | ICD-10-CM

## 2015-10-29 DIAGNOSIS — Z006 Encounter for examination for normal comparison and control in clinical research program: Secondary | ICD-10-CM

## 2015-10-29 LAB — URINE CULTURE

## 2015-10-29 LAB — ECHOCARDIOGRAM COMPLETE
Height: 63 in
Weight: 2336 oz

## 2015-10-29 LAB — GLUCOSE, CAPILLARY
GLUCOSE-CAPILLARY: 211 mg/dL — AB (ref 65–99)
Glucose-Capillary: 180 mg/dL — ABNORMAL HIGH (ref 65–99)
Glucose-Capillary: 179 mg/dL — ABNORMAL HIGH (ref 65–99)
Glucose-Capillary: 210 mg/dL — ABNORMAL HIGH (ref 65–99)

## 2015-10-29 LAB — CBC
HEMATOCRIT: 44 % (ref 36.0–46.0)
Hemoglobin: 13.9 g/dL (ref 12.0–15.0)
MCH: 24 pg — ABNORMAL LOW (ref 26.0–34.0)
MCHC: 31.6 g/dL (ref 30.0–36.0)
MCV: 76 fL — ABNORMAL LOW (ref 78.0–100.0)
PLATELETS: 436 10*3/uL — AB (ref 150–400)
RBC: 5.79 MIL/uL — ABNORMAL HIGH (ref 3.87–5.11)
RDW: 19.7 % — AB (ref 11.5–15.5)
WBC: 7.3 10*3/uL (ref 4.0–10.5)

## 2015-10-29 LAB — BASIC METABOLIC PANEL
Anion gap: 14 (ref 5–15)
BUN: 20 mg/dL (ref 6–20)
CALCIUM: 8 mg/dL — AB (ref 8.9–10.3)
CO2: 16 mmol/L — AB (ref 22–32)
CREATININE: 0.5 mg/dL (ref 0.44–1.00)
Chloride: 110 mmol/L (ref 101–111)
GFR calc non Af Amer: >60 mL/min (ref >60)
GLUCOSE: 192 mg/dL — AB (ref 65–99)
Potassium: 4.1 mmol/L (ref 3.5–5.1)
Sodium: 140 mmol/L (ref 135–145)

## 2015-10-29 MED ORDER — NITROFURANTOIN MONOHYD MACRO 100 MG PO CAPS
100.0000 mg | ORAL_CAPSULE | Freq: Two times a day (BID) | ORAL | Status: DC
  Administered 2015-10-29 – 2015-10-31 (×5): 100 mg via ORAL
  Filled 2015-10-29 (×7): qty 1

## 2015-10-29 MED ORDER — SACUBITRIL-VALSARTAN 24-26 MG PO TABS
1.0000 | ORAL_TABLET | Freq: Two times a day (BID) | ORAL | Status: DC
  Administered 2015-10-29 – 2015-10-31 (×5): 1 via ORAL
  Filled 2015-10-29 (×6): qty 1

## 2015-10-29 NOTE — Progress Notes (Signed)
Subjective:  Patient denies any chest pain or shortness of breath.  Now agrees for skilled nursing facility.2-D echo showed markedly depressed LV systolic function with global hypokinesis.  Objective:  Vital Signs in the last 24 hours: Temp:  [97.7 F (36.5 C)] 97.7 F (36.5 C) (05/11 2219) Pulse Rate:  [92-94] 94 (05/12 0933) Resp:  [18] 18 (05/12 0650) BP: (130-144)/(86-118) 132/86 mmHg (05/12 0933) SpO2:  [96 %-100 %] 96 % (05/12 0650)  Intake/Output from previous day: 05/11 0701 - 05/12 0700 In: 1140 [P.O.:1140] Out: 375 [Urine:375] Intake/Output from this shift:    Physical Exam: Neck: no adenopathy, no carotid bruit, no JVD and supple, symmetrical, trachea midline Lungs: decreased breath sounds at bases Heart: regular rate and rhythm, S1, S2 normal and soft systolic murmur noted Abdomen: soft, non-tender; bowel sounds normal; no masses,  no organomegaly  Lab Results:  Recent Labs  10/28/15 0553 10/29/15 0334  WBC 7.8 7.3  HGB 13.9 13.9  PLT 467* 436*    Recent Labs  10/27/15 0220 10/29/15 0334  NA 141 140  K 5.4* 4.1  CL 110 110  CO2 17* 16*  GLUCOSE 173* 192*  BUN 14 20  CREATININE 0.48 0.50    Recent Labs  10/27/15 0220 10/27/15 0947  TROPONINI 0.08* 0.09*   Hepatic Function Panel No results for input(s): PROT, ALBUMIN, AST, ALT, ALKPHOS, BILITOT, BILIDIR, IBILI in the last 72 hours.  Recent Labs  10/27/15 0220  CHOL 241*   No results for input(s): PROTIME in the last 72 hours.  Imaging: Imaging results have been reviewed and No results found.  Cardiac Studies:  Assessment/Plan:  Acute bilateral pulmonary embolism Minimally elevated troponin I secondary to about doubt MI Dilated cardiomyopathy Right leg DVT Hypertension Type 2 diabetes mellitus Multiple sclerosis Chronic decubitus ulcers UTI Plan Start Entresto as per orders. Reduce IV fluids to [facility] Will need ischemic workup in future  Awaiting skilled nursing facility  LOS: 3 days    Charolette Forward 10/29/2015, 1:03 PM

## 2015-10-29 NOTE — Progress Notes (Signed)
Patient began shouting out expressing pain in her abdominal area (RLQ). Patient stated that, "it subsided and that she was okay". Patient shouted out again complaining of her abdominal area. Abdomen soft to touch, nontender, bowel sounds faint but active. This RN offered maalox to the patient but the patient refused. Patient believes that it is gas and that she is okay. Will continue to monitor.

## 2015-10-29 NOTE — Clinical Social Work Note (Signed)
Clinical Social Work Assessment  Patient Details  Name: Jocelyn Sanchez MRN: [id number] Date of Birth: 05/07/1956  Date of referral:  10/29/15               Reason for consult:  Discharge Planning                Permission sought to share information with:  Psychiatrist Permission granted to share information::  Yes, Verbal Permission Granted  Name::     [place]::  SNFs  Relationship::  Son  Contact Information:     Housing/Transportation Living arrangements for the past 2 months:  Single Family Home Source of Information:  Patient Patient Interpreter Needed:  None Criminal Activity/Legal Involvement Pertinent to Current Situation/Hospitalization:  No - Comment as needed Significant Relationships:  Adult Children Lives with:  Adult Children Do you feel safe going back to the place where you live?  Yes Need for family participation in patient care:  Yes (Comment)  Care giving concerns:  The patient is aggregable for short term rehab at discharge. Patient would like to rebuild her strength to return home.    Social Worker assessment / plan:  CSW met with patient at beside to complete assessment. Patient was resting comfortably in bed. CSW explained PT recommendation for SNF placement. CSW explained SNF search and placement process to the patient and answered her questions. Patient reported her support as her children. Per patient request CSW called Marshell Rieger (patient's son). He reported he is concerned  with his mother returning home due to not being able to manage her home. He reported he believes that his mother would benefit from a short term rehab.  CSW will follow up with bed offers.   Employment status:  Disabled (Comment on whether or not currently receiving Disability) Insurance information:  Medicare PT Recommendations:  Not assessed at this time Information / Referral to community resources:  [place]  Patient/Family's Response to care:  The  patient appears happy with the care she is receiving in hospital and is appreciative of CSW assistance.  Patient/Family's Understanding of and Emotional Response to Diagnosis, Current Treatment, and Prognosis: The patient has a good understanding of why she was admitted. She understands the care plan and what she will need post discharge.  Emotional Assessment Appearance:  Appears stated age Attitude/Demeanor/Rapport:   (Patient was welcoming of CSW and appropriate. ) Affect (typically observed):  Accepting, Calm, Appropriate Orientation:  Oriented to Self, Oriented to Place, Oriented to  Time, Oriented to Situation Alcohol / Substance use:  Not Applicable Psych involvement (Current and /or in the community):  No (Comment)  Discharge Needs  Concerns to be addressed:  Discharge Planning Concerns Readmission within the last 30 days:  No Current discharge risk:  Physical Impairment Barriers to Discharge:  Continued Medical Work up   [employer], LCSW 10/29/2015, 12:16 PM

## 2015-10-29 NOTE — NC FL2 (Signed)
[place] LEVEL OF CARE SCREENING TOOL     IDENTIFICATION  [name] Birthdate: Apr 17, 1956 Sex: female Admission Date (Current Location): 10/26/2015  [facility] and [place] Number:  Herbalist and Address:  [facility] [place], [place] [address] [place], [place] [address]      Provider Number: [id number]  Attending Physician Name and Address:  Charolette Forward, MD  Relative Name and Phone Number:       Current Level of Care: Hospital Recommended Level of Care: [place] Prior Approval Number:    Date Approved/Denied:   PASRR Number: [id number]  Discharge Plan: SNF    Current Diagnoses: Patient Active Problem List   Diagnosis Date Noted  . Pressure ulcer 10/27/2015  . Pulmonary emboli ([place]) 10/26/2015  . Bilateral pulmonary embolism ([place]) 10/26/2015  . PVD (peripheral vascular disease) ([place]) 07/04/2015  . Essential hypertension, benign 06/17/2015  . Decubitus ulcer of ankle, stage 4 ([place]) 05/10/2015  . Decubitus ulcer of coccygeal region 05/10/2015  . FUO (fever of unknown origin)   . Back pain   . Proctitis   . Citrobacter infection 03/08/2015  . Fever 03/08/2015  . Sepsis due to Escherichia coli with acute renal failure ([place]) 03/06/2015  . Septic shock ([place]) 03/06/2015  . E. coli UTI (urinary tract infection) 03/06/2015  . Diabetes mellitus with peripheral vascular disease ([place]) 03/06/2015  . Leukocytosis 03/06/2015  . Hypophosphatemia 03/06/2015  . Hypomagnesemia 03/06/2015  . Anemia of chronic disease 03/06/2015  . Decubitus ulcer, stage 2 03/06/2015  . Lymphedema 03/06/2015  . Hypokalemia 05/11/2011  . Multiple sclerosis ([place]) 05/11/2011    Orientation RESPIRATION BLADDER Height & Weight     Self, Time, Situation, Place  O2 Indwelling catheter Weight: 146 lb (66.225 kg) Height:  5\' 3"  (160 cm)  BEHAVIORAL SYMPTOMS/MOOD NEUROLOGICAL BOWEL NUTRITION STATUS   (None)  (None)   Diet  (Heart)  AMBULATORY STATUS COMMUNICATION OF NEEDS Skin   Extensive Assist   PU Stage and Appropriate Care       PU Stage 4 Dressing: Daily (Sacrum Medial , Foam )               Personal Care Assistance Level of Assistance  Bathing, Feeding, Dressing Bathing Assistance: Limited assistance Feeding assistance: Independent Dressing Assistance: Limited assistance     Functional Limitations Info  Sight, Hearing Sight Info: Adequate Hearing Info: Adequate Speech Info: Adequate    SPECIAL CARE FACTORS FREQUENCY  PT (By licensed PT), OT (By licensed OT)     PT Frequency: 5/ week OT Frequency: 5/ week            Contractures Contractures Info: Not present    Additional Factors Info  Insulin Sliding Scale Code Status Info: Full Allergies Info: Sulfa Antibiotics   Insulin Sliding Scale Info: Novolog 0-9 units 3 times a day Isolation Precautions Info: Contact Precautions Extended spectrum beta lactamase, MRSA     Current Medications (10/29/2015):  This is the current hospital active medication list Current Facility-Administered Medications  Medication Dose Route Frequency Provider Last Rate Last Dose  . 0.9 %  sodium chloride infusion   Intravenous Continuous Charolette Forward, MD      . 0.9 %  sodium chloride infusion   Intravenous Continuous Charolette Forward, MD 50 mL/hr at 10/28/15 2256    . acetaminophen (TYLENOL) tablet 500 mg  500 mg Oral QHS PRN Jaquita Folds, RPH       And  .  diphenhydrAMINE (BENADRYL) capsule 25 mg  25 mg Oral QHS PRN Jaquita Folds, RPH   25 mg at 10/27/15 2236  . acetaminophen (TYLENOL) tablet 650 mg  650 mg Oral Q4H PRN Charolette Forward, MD   650 mg at 10/28/15 1554  . apixaban (ELIQUIS) tablet 10 mg  10 mg Oral BID Romona Curls, RPH   10 mg at 10/29/15 [id number]   Followed by  . [START ON 11/03/2015] apixaban (ELIQUIS) tablet 5 mg  5 mg Oral BID Romona Curls, [facility]      . aspirin EC tablet 81 mg  81 mg Oral Daily Charolette Forward, MD   81 mg at 10/29/15 0934  .  carvedilol (COREG) tablet 3.125 mg  3.125 mg Oral BID Charolette Forward, MD   3.125 mg at 10/29/15 0935  . docusate sodium (COLACE) capsule 100 mg  100 mg Oral BID Charolette Forward, MD   100 mg at 10/29/15 0938  . feeding supplement (ENSURE ENLIVE) (ENSURE ENLIVE) liquid 237 mL  237 mL Oral BID BM Charolette Forward, MD   237 mL at 10/29/15 0942  . feeding supplement (PRO-STAT SUGAR FREE 64) liquid 30 mL  30 mL Oral BID Charolette Forward, MD   30 mL at 10/29/15 0933  . glipiZIDE (GLUCOTROL) tablet 5 mg  5 mg Oral BID WC Charolette Forward, MD   5 mg at 10/29/15 [id number]  . insulin aspart (novoLOG) injection 0-9 Units  0-9 Units Subcutaneous TID WC Charolette Forward, MD   2 Units at 10/29/15 [id number]  . LORazepam (ATIVAN) tablet 0.5 mg  0.5 mg Oral Daily PRN Charolette Forward, MD   0.5 mg at 10/28/15 2256  . nitrofurantoin (macrocrystal-monohydrate) (MACROBID) capsule 100 mg  100 mg Oral Q12H Charolette Forward, MD      . nitroGLYCERIN (NITROSTAT) SL tablet 0.4 mg  0.4 mg Sublingual Q5 Min x 3 PRN Charolette Forward, MD      . ondansetron (ZOFRAN) injection 4 mg  4 mg Intravenous Q6H PRN Charolette Forward, MD      . pantoprazole (PROTONIX) EC tablet 40 mg  40 mg Oral Q0600 Charolette Forward, MD   40 mg at 10/29/15 [id number]  . zolpidem (AMBIEN) tablet 5 mg  5 mg Oral QHS PRN Charolette Forward, MD   5 mg at 10/28/15 2235     Discharge Medications: Please see discharge summary for a list of discharge medications.  Relevant Imaging Results:  Relevant Lab Results:   Additional Information [id number]  Samule Dry, LCSW

## 2015-10-29 NOTE — Progress Notes (Signed)
Apixaban Validation Study (ClinicalTrials.gov Identifier: [id number]) RESEARCH SUBJECT. Purpose: Obtain fresh samples that will be used to assess the performances of STA-Apixaban Calibrator and STA-Apixaban Control in combination with the STA-Liquid Anti-Xa to determine the quantity of apixaban in plasma samples by measurement of its direct anti-Xa activity. Apixaban Validation Study is sponsored by [employer].The fresh samples will collected by completing a one time blood draw on approved patients.   Inclusion Criteria: weight </= 60kg, >/= 75 years, Hct <39% for female, < 36% for female, renal impairment, co-medication with ASA/NSAIDs, and/or co-medication with anti-platelet agents.  Protocol Title:  Apixaban Validation Study Informed Consent   Subject Name: Jocelyn Sanchez  This patient, Jocelyn Sanchez, has been consented to the above clinical trial according to FDA regulations, GCP guidelines and [employer]'s SOPs. The informed consent form and study design have been explained to this patient by this study coordinator. The patient demonstrated comprehension of this clinical trial and study requirements/expectations. No study procedures have been initiated before consenting of this patient. The patient was given sufficient time for reading the consent form. All risks, benefits and options have been thoroughly discussed and all questions were answered per the patient's satisfaction. This patient was not coerced in any way to participate in this clinical trial. This patient has voluntarily signed consent version one at 1415pm on 10/29/2015. A copy of the signed consent form was given to the patient and a copy was placed in the subject's medical record.   Patient has a documented history of Multiple Sclerosis that has effected the mobility and use of her hands. This is why the patients signature looks like a "x" and is difficult to interpret. I attest that this is the actual patients  signature, and I was present at the time of consenting.  The study was explained to the patient on three different occasions, as well as to her son on one occasion. The patient stated understanding of the study components and was able to verbally communicate back to me the study requirements. When the patient was asked to sign the consent form, she stated "I can't write to well, it will be messy." I  advised the patient that the signature does not have to be perfect or neat, but that it has to be her signature from her hands. Patient stated understanding and proceeded to sign and date the consent form. This case was discussed with PI, Dr. Jorene Guest.   [place], [place], Research Assistant Office [phone]

## 2015-10-30 LAB — CBC
HCT: 48 % — ABNORMAL HIGH (ref 36.0–46.0)
Hemoglobin: 15.6 g/dL — ABNORMAL HIGH (ref 12.0–15.0)
MCH: 24.3 pg — ABNORMAL LOW (ref 26.0–34.0)
MCHC: 32.5 g/dL (ref 30.0–36.0)
MCV: 74.8 fL — ABNORMAL LOW (ref 78.0–100.0)
PLATELETS: 434 10*3/uL — AB (ref 150–400)
RBC: 6.42 MIL/uL — ABNORMAL HIGH (ref 3.87–5.11)
RDW: 19.9 % — AB (ref 11.5–15.5)
WBC: 8.4 10*3/uL (ref 4.0–10.5)

## 2015-10-30 LAB — GLUCOSE, CAPILLARY
GLUCOSE-CAPILLARY: 181 mg/dL — AB (ref 65–99)
GLUCOSE-CAPILLARY: 186 mg/dL — AB (ref 65–99)
GLUCOSE-CAPILLARY: 250 mg/dL — AB (ref 65–99)
Glucose-Capillary: 198 mg/dL — ABNORMAL HIGH (ref 65–99)

## 2015-10-30 MED ORDER — NITROFURANTOIN MONOHYD MACRO 100 MG PO CAPS: 100.0000 mg | ORAL_CAPSULE | Freq: Two times a day (BID) | ORAL | Status: DC

## 2015-10-30 MED ORDER — SACUBITRIL-VALSARTAN 24-26 MG PO TABS: 1.0000 | ORAL_TABLET | Freq: Two times a day (BID) | ORAL | Status: DC

## 2015-10-30 MED ORDER — TRAMADOL HCL 50 MG PO TABS
50.0000 mg | ORAL_TABLET | Freq: Four times a day (QID) | ORAL | Status: DC | PRN
  Administered 2015-10-30 (×2): 50 mg via ORAL
  Filled 2015-10-30 (×3): qty 1

## 2015-10-30 MED ORDER — GLIPIZIDE 5 MG PO TABS: 5.0000 mg | ORAL_TABLET | Freq: Two times a day (BID) | ORAL | Status: DC

## 2015-10-30 MED ORDER — APIXABAN 5 MG PO TABS: 10.0000 mg | ORAL_TABLET | Freq: Two times a day (BID) | ORAL | Status: DC

## 2015-10-30 MED ORDER — APIXABAN 5 MG PO TABS: 5.0000 mg | ORAL_TABLET | Freq: Two times a day (BID) | ORAL | Status: DC

## 2015-10-30 NOTE — Progress Notes (Signed)
Physical Therapy Evaluation Patient Details Name: Jocelyn Sanchez MRN: [id number] DOB: 03/04/1956 Today's Date: 10/30/2015   History of Present Illness  60 yo female who is a hoyer transfer at baseline was referred to PT for assessment of needs for follow up care.  Clinical Impression  Pt was unable to actively move her legs or help position herself in bed although she does self feed.  Her needs are for more ongoing nursing care and is at baseline since her family has a hoyer lift at home per pt.  Will not have any further inpt acute needs and will expect the SNF referral from MD will encompass a PT screen to see if needs are there for transitioning training to family for home.    Follow Up Recommendations No PT follow up    Equipment Recommendations  None recommended by PT    Recommendations for Other Services       Precautions / Restrictions Precautions Precautions: Other (comment) (telemetry, contractures LE's) Restrictions Weight Bearing Restrictions: No Other Position/Activity Restrictions: BLE contractures that are dependent care level       Mobility  Bed Mobility Overal bed mobility: + 2 for safety/equipment;+2 for physical assistance;Needs Assistance (2 for dependent care, cannot roll or shift for herself) Bed Mobility: Rolling (Pt is on bedrest) Rolling: Total assist;+2 for physical assistance;+2 for safety/equipment         General bed mobility comments: Pt cannot reposition herself and is at risk for skin breakdown  Transfers Overall transfer level: Needs assistance Equipment used:  (bedrest but is contracted in hips and knees-HOYER)                Ambulation/Gait             General Gait Details: unable for last 5 years  Hotel manager mobility:  (unable due to position of legs)  Modified Rankin (Stroke Patients Only)       Balance                                              Pertinent Vitals/Pain Pain Assessment: No/denies pain    Home Living Family/patient expects to be discharged to:: Skilled nursing facility                 Additional Comments: has aide 6hrs per day    Prior Function Level of Independence: Needs assistance   Gait / Transfers Assistance Needed: hoyer lift  ADL's / Homemaking Assistance Needed: total care for ADLS, feeds self        Hand Dominance   Dominant Hand: Right    Extremity/Trunk Assessment   Upper Extremity Assessment: Generalized weakness (self feeds)           Lower Extremity Assessment: Generalized weakness      Cervical / Trunk Assessment: Other exceptions (has rotated trunk due to LE position and weakness)  Communication   Communication: No difficulties  Cognition Arousal/Alertness: Awake/alert Behavior During Therapy: WFL for tasks assessed/performed Overall Cognitive Status: Within Functional Limits for tasks assessed                      General Comments General comments (skin integrity, edema, etc.): Pt is total care due to her legs being in windswept position and in flexion  contractures for hips and knees    Exercises        Assessment/Plan    PT Assessment Patent does not need any further PT services;All further PT needs can be met in the next venue of care  PT Diagnosis Generalized weakness;Other (comment) (MS)   PT Problem List Decreased strength;Decreased range of motion;Decreased activity tolerance;Decreased mobility;Impaired sensation;Impaired tone;Decreased skin integrity  PT Treatment Interventions     PT Goals (Current goals can be found in the Care Plan section) Acute Rehab PT Goals Patient Stated Goal: none stated PT Goal Formulation: All assessment and education complete, DC therapy    Frequency     Barriers to discharge        Co-evaluation               End of Session   Activity Tolerance: Patient tolerated treatment  well;Other (comment) (limited by bedrest and LE contractures) Patient left: in bed;with call bell/phone within reach;with bed alarm set Nurse Communication: Mobility status         Time: 0086-7619 PT Time Calculation (min) (ACUTE ONLY): 20 min   Charges:   PT Evaluation $PT Eval Low Complexity: 1 Procedure     PT G CodesRamond Dial Nov 14, 2015, 10:55 AM    Mee Hives, PT MS Acute Rehab Dept. Number: [facility] [id number] and [place] [phone]

## 2015-10-30 NOTE — Progress Notes (Signed)
[facility] does not have a private room to accept patient. CSW attempting to find an alternate SNF bed.  Percell Locus [place] LCSWA [phone]

## 2015-10-30 NOTE — Discharge Summary (Signed)
Prior to discharge summary dictated on 10/30/2015 dictation number is [phone]

## 2015-10-30 NOTE — Progress Notes (Signed)
PT Cancellation Note  Patient Details Name: Jocelyn Sanchez MRN: [id number] DOB: 1955-11-19   Cancelled Treatment:    Reason Eval/Treat Not Completed: Medical issues which prohibited therapy (elevated BP and cardiology consult determined PE's).  Will await medical clearance for PT to see pt.   Ramond Dial 10/30/2015, 8:58 AM    Mee Hives, PT MS Acute Rehab Dept. Number: [facility] [id number] and [place] [phone]

## 2015-10-30 NOTE — Discharge Summary (Signed)
NAMEDAEGAN, STROBLE              ACCOUNT NO.:  [id number]  MEDICAL RECORD NO.:  [id number]  LOCATION:  2W36C                        FACILITY:  [facility]  PHYSICIAN:  Abas Leicht N. Terrence Dupont, M.D. DATE OF BIRTH:  Jan 31, 1956  DATE OF ADMISSION:  10/26/2015 DATE OF DISCHARGE:  10/30/2015                              DISCHARGE SUMMARY   ADMITTING DIAGNOSES: 1. Acute bilateral pulmonary embolism. 2. Minimally elevated troponin I secondary to above, doubt significant     myocardial infarction. 3. Chronic leg swelling, rule out deep venous thrombosis. 4. Hypertension. 5. Diabetes mellitus. 6. Multiple sclerosis. 7. Chronic decubitus ulcers of the back.  FINAL DIAGNOSES: 1. Acute bilateral pulmonary embolism. 2. Deep venous thrombosis of the right leg, minimally elevated     troponin I secondary to above.  Secondary to pulmonary embolism,     doubt significant myocardial infarction.  Dilated cardiomyopathy. 3. Hypertension. 4. Diabetes mellitus. 5. Multiple sclerosis. 6. Chronic decubitus ulcers. 7. Escherichia coli urinary tract infection.  DISCHARGE MEDICATIONS: 1. Eliquis 10 mg twice daily for 4 more days and then 5 mg twice     daily.  Macrobid 100 mg 1 capsule twice daily for 1 week. 2. Entresto 24/26 mg 1 tablet twice daily. 3. Tylenol 650 mg every 6 hours as needed. 4. Carvedilol 3.125 mg twice daily. 5. Tylenol p.m. 1 tablet daily at night as needed. 6. Colace 100 mg twice daily as before. 7. Norco 2 tablets every 4 hours as needed for moderate pain. 8. Robaxin 500 mg half tablet every 8 hours as needed. 9. Ambien 5 mg daily as needed. 10.Glipizide 5 mg twice daily. 11.The patient has been advised to stop aspirin.  DIET:  Low-salt, low-cholesterol 1800 calories ADA diet.  Follow up with Dr. Doylene Canard in 1 week.  CONDITION AT DISCHARGE:  Stable.  BRIEF HISTORY AND HOSPITAL COURSE:  Ms. Raymer is a 60 year old female with past medical history significant for multiple  sclerosis.  She is bed bound.  Type 2 diabetes mellitus, hypertension, chronic lower extremity lymphedema, anemia, chronic back ulcers.  She came to the ER by EMS, complaining of progressive increasing shortness of breath for last 3-4 weeks, associated with feeling weak and tired.  The patient denies any cough, fever, or chills.  Denies hemoptysis.  The patient was noted to have elevated D-dimer.  Subsequently, a CT angio which showed positive for bilateral pulmonary embolism with no evidence of right heart strain.  The patient was started on IV heparin in the ED.  The patient denies any chest pain, denies palpitation, lightheadedness, or syncope.  Denies hemoptysis.  PHYSICAL EXAMINATION:  GENERAL:  She was alert, awake, oriented x3.  Her blood pressure was 146/98, pulse 72, she was afebrile.  HEENT: Conjunctivae were pink. NECK:  Supple.  No JVD.  No bruit. LUNGS:  Clear to auscultation anteriorly, decreased breath sounds at bases. CARDIOVASCULAR:  S1, S2 was normal.  There was soft systolic murmur. ABDOMEN:  Soft.  Bowel sounds were present.  Nontender. EXTREMITIES:  There is no clubbing, cyanosis.  1+ edema was noted.  LABORATORY DATA:  Sodium was 141, potassium 4.0, BUN 14, creatinine 0.46.  Her BNP was 805.  Troponin I were 0.08,  0.09, 0.10, 0.08. Hemoglobin was 15.8, hematocrit 49.2, white count of 6.1.  D-dimer was 9.09.  Chest x-ray showed retrocardiac atelectasis, cardiomegaly without failure.  CT angio of the chest showed positive for bilateral pulmonary emboli, moderate clot burden.  No evidence of right heart strain.  Small to moderate size bilateral pleural effusion with atelectasis, 4 mm nodule in the right upper lobe which has not changed.  Urine culture grew E. coli which was resistant to Cipro, but sensitive to nitrofurantoin.  A 2D echo showed diffuse hypokinesia with EF of 20-25%. There was severe mitral and moderate tricuspid regurgitation.  BRIEF HOSPITAL  COURSE:  The patient was admitted to telemetry unit.  The patient was started on IV heparin which was subsequently switched to Eliquis.  The patient did not have any episodes of chest pain during the hospital stay.  Her breathing gradually improved.  The patient was also noted to have turbid urine in the indwelling Foley.  Cultures were sent and was started empirically on Cipro.  Urine culture grew E. coli which was resistant to ciprofloxacin.  Antibiotic was switched to nitrofurantoin.  Her renal function has remained stable.  The patient was started on Entresto which she is tolerating it well.  We will up titrate Entresto dose as outpatient as tolerated.  The patient was discussed at length regarding skilled nursing facility.  Initially, the patient refused, but then finally agreed for skilled nursing facility. The patient has bed offers at [facility], will be transferred to skilled nursing facility and will be followed up by Dr. Doylene Canard in 1 week.     Allegra Lai. Terrence Dupont, M.D.     MNH/MEDQ  D:  10/30/2015  T:  10/30/2015  Job:  [id number]

## 2015-10-31 LAB — CBC
HEMATOCRIT: 48.2 % — AB (ref 36.0–46.0)
Hemoglobin: 15.7 g/dL — ABNORMAL HIGH (ref 12.0–15.0)
MCH: 24.9 pg — ABNORMAL LOW (ref 26.0–34.0)
MCHC: 32.6 g/dL (ref 30.0–36.0)
MCV: 76.5 fL — AB (ref 78.0–100.0)
Platelets: 433 10*3/uL — ABNORMAL HIGH (ref 150–400)
RBC: 6.3 MIL/uL — ABNORMAL HIGH (ref 3.87–5.11)
RDW: 20.3 % — AB (ref 11.5–15.5)
WBC: 8 10*3/uL (ref 4.0–10.5)

## 2015-10-31 LAB — GLUCOSE, CAPILLARY
Glucose-Capillary: 163 mg/dL — ABNORMAL HIGH (ref 65–99)
Glucose-Capillary: 181 mg/dL — ABNORMAL HIGH (ref 65–99)

## 2015-10-31 NOTE — Care Management Note (Signed)
Case Management Note Marvetta Gibbons RN, BSN Unit 2W-Case Manager [phone]  Patient Details  Name: MICAYLAH HIMMELREICH MRN: [id number] Date of Birth: 1956/05/27  Subjective/Objective:   Pt admitted with PE                 Action/Plan: PTA pt lived at home with family- is active with [facility] for [place], will need resumption order at discharge for [place]- per conversation with pt at bedside- she also has an aide that comes from [place] 8-2 and 5-8 daily, pt reports that she has all needed DME at home including hospital bed and hoyer lift. Per pt she transports home via non-emergent EMS- will arrange EMS transport when pt ready for d/c home. Pt started on Eliquis- insurance check completed- Per rep at [facility]:  Patient has no deductible/ and $0 co-pay for all medications  Covered, no auth required --  Coverage confirmed with pt- pt given 30 day free trial card to use if she wants as she already has $0 copay-   Expected Discharge Date:   10/31/15               Expected Discharge Plan:  [place]  In-House Referral:  Clinical Social Work  Discharge planning Services  CM Consult  Post Acute Care Choice:  Home Health, Resumption of Svcs/PTA Provider Choice offered to:  Patient  DME Arranged:    DME Agency:     HH Arranged:  RN [place] Agency:  [facility]  Status of Service:  Completed, signed off  Medicare Important Message Given:    Date Medicare IM Given:    Medicare IM give by:    Date Additional Medicare IM Given:    Additional Medicare Important Message give by:     If discussed at [place] of Stay Meetings, dates discussed:    Additional Comments:  10/29/15- pt has now decided to go to SNF, CSW working on placement-   Dahlia Client, [employer], RN 10/31/2015, 9:33 AM

## 2015-10-31 NOTE — Clinical Social Work Note (Signed)
Clinical Social Worker facilitated patient discharge including contacting patient family and facility to confirm patient discharge plans.  Clinical information faxed to facility and family agreeable with plan.  CSW arranged ambulance transport via [facility] to [place].  RN to call report prior to discharge.  Clinical Social Worker will sign off for now as social work intervention is no longer needed. Please consult [place] again if new need arises.  Barbette Or, LCSW (Weekend Coverage Only) [phone]

## 2015-10-31 NOTE — Progress Notes (Signed)
Attempted to call report at 1530, unable to get through to [facility].  Shirley Muscat, RN

## 2015-10-31 NOTE — Evaluation (Signed)
Occupational Therapy Evaluation and Discharge Patient Details Name: Jocelyn Sanchez MRN: [id number] DOB: Dec 21, 1955 Today's Date: 10/31/2015    History of Present Illness Patient is 60 year old female with past medical history significant for multiple sclerosis bedbound, type 2 diabetes mellitus, hypertension, chronic lower extremity lymphedema, anemia, chronic back ulcers, came to the ER by EMS complaining of progressive increasing shortness of breath for 3-4 weeks associated with feeling weak tired.. Found to be positve for Bil PE and RLE DVT   Clinical Impression   This 60 yo female admitted and found to have above presents to acute OT with deficits below (see OT problem list) thus affecting her ability to help with her basic ADLs. She could benefit from a trial of OT at SNF to see if they can help her gain any independence with grooming and UB bathing tasks. Acute OT will defer remainder of OT services to SNF as they deem appropriate.    Follow Up Recommendations  SNF    Equipment Recommendations   (TBD at next venue)       Precautions / Restrictions Precautions Precautions: Other (comment) (LE contractures and decreased use of UEs) Restrictions Weight Bearing Restrictions: No Other Position/Activity Restrictions: Decreased A/PROM of Bil LEs due to contractures              ADL Overall ADL's : Needs assistance/impaired   Eating/Feeding Details (indicate cue type and reason): set up to Mod A depending on position of pt and food as well as what type of food she is trying to eat. She was able to stablize a cup with her LUE while she used her RUE to scoop ice out of it (then place the cup on the bedside table to her left with LUE), also able to pick up Sprite can off of bedside table with LUE, drink from it with a straw and return it to table. Grooming: Total assistance Grooming Details (indicate cue type and reason): Cannot get enough active shoulder movement to get to any part  of her face except her mouth Upper Body Bathing: Total assistance;Bed level   Lower Body Bathing: Total assistance;Bed level   Upper Body Dressing : Total assistance;Bed level   Lower Body Dressing: Total assistance;Bed level       Toileting- Clothing Manipulation and Hygiene: Total assistance;Bed level                         Pertinent Vitals/Pain Pain Assessment: No/denies pain     Hand Dominance Right (but has more movement/function in LUE)   Extremity/Trunk Assessment Upper Extremity Assessment Upper Extremity Assessment: RUE deficits/detail;LUE deficits/detail RUE Deficits / Details: minimal PROM at shoulder, almost full AROM at elbow, decreased supination--full pronation, full wrist flexion--can only get to neutral for extension, flat palm--more movement at PIPs/DIPs than MCPs RUE Coordination: decreased fine motor;decreased gross motor LUE Deficits / Details: minimal P/AROM at shoulder (but more than LUE), full AROM at elbow, decreased supination--full pronation, full wrist flexion--can get slighly past neutral for extension, flat palm--more movement at PIPs/DIPs/MCPs than RUE but still limited. This is the hand she mainly self feeds with LUE Coordination: decreased fine motor;decreased gross motor           Communication Communication Communication: No difficulties   Cognition Arousal/Alertness: Awake/alert Behavior During Therapy: WFL for tasks assessed/performed Overall Cognitive Status: Within Functional Limits for tasks assessed  Home Living Family/patient expects to be discharged to:: Skilled nursing facility                                 Additional Comments: has aide 6hrs per day      Prior Functioning/Environment Level of Independence: Needs assistance  Gait / Transfers Assistance Needed: hoyer lift ADL's / Homemaking Assistance Needed: total care for ADLS, feeds self (easy  foods--mainly finger foods and foods that are easy to get on and stay on a spoon)        OT Diagnosis: Generalized weakness   OT Problem List: Decreased strength;Decreased range of motion;Impaired UE functional use;Obesity      OT Goals(Current goals can be found in the care plan section) Acute Rehab OT Goals Patient Stated Goal: to rehab then hopefully back home  OT Frequency:                End of Session Nurse Communication: Other (comment) (asked nursing secretary to get pt a soft touch call bell due to decreased use pt has of her arms/hands)  Activity Tolerance: Patient tolerated treatment well Patient left: in bed;with call bell/phone within reach   Time: [id number] OT Time Calculation (min): 14 min Charges:  OT General Charges $OT Visit: 1 Procedure OT Evaluation $OT Eval Moderate Complexity: 1 Procedure  Almon Register [id number] 10/31/2015, 3:45 PM

## 2015-10-31 NOTE — Progress Notes (Signed)
Subjective:  Patient denies any chest pain states breathing is much improved overall feels good. Could not be transferred to nursing home yesterday because of bed issues.  Objective:  Vital Signs in the last 24 hours: Temp:  [97.4 F (36.3 C)-97.8 F (36.6 C)] 97.5 F (36.4 C) (05/14 0550) Pulse Rate:  [86-95] 86 (05/14 0550) Resp:  [18] 18 (05/14 0550) BP: (116-134)/(79-99) 127/86 mmHg (05/14 0550) SpO2:  [98 %-100 %] 98 % (05/14 0550)  Intake/Output from previous day: 05/13 0701 - 05/14 0700 In: 960 [P.O.:960] Out: 150 [Urine:150] Intake/Output from this shift:    Physical Exam: Exam unchanged  Lab Results:  Recent Labs  10/30/15 0539 10/31/15 0503  WBC 8.4 8.0  HGB 15.6* 15.7*  PLT 434* 433*    Recent Labs  10/29/15 0334  NA 140  K 4.1  CL 110  CO2 16*  GLUCOSE 192*  BUN 20  CREATININE 0.50   No results for input(s): TROPONINI in the last 72 hours.  Invalid input(s): CK, MB Hepatic Function Panel No results for input(s): PROT, ALBUMIN, AST, ALT, ALKPHOS, BILITOT, BILIDIR, IBILI in the last 72 hours. No results for input(s): CHOL in the last 72 hours. No results for input(s): PROTIME in the last 72 hours.  Imaging: Imaging results have been reviewed and No results found.  Cardiac Studies:  Assessment/Plan:  Acute bilateral pulmonary embolism Minimally elevated troponin I secondary to about doubt MI Dilated cardiomyopathy Right leg DVT Hypertension Type 2 diabetes mellitus Multiple sclerosis Chronic decubitus ulcers UTI Plan Okay to discharge to skilled nursing facility No changes in discharge summary.  LOS: 5 days    Jocelyn Sanchez 10/31/2015, 8:21 AM

## 2015-10-31 NOTE — Progress Notes (Signed)
Pt. Discharged to [facility] Pt. D/C'd via stretcher with transport   All personal belongings given to Pt.   IV was d/c Tele d/c

## 2015-10-31 NOTE — Progress Notes (Signed)
Called and gave report on patient.

## 2015-10-31 NOTE — Progress Notes (Signed)
Utilization review completed.  

## 2015-11-11 NOTE — Progress Notes (Signed)
I have reviewed the patient's assessment for inclusion into Apixaban Validation Study and agree with assessment and patient's acknowledgement of informed consent.

## 2015-11-11 NOTE — Progress Notes (Signed)
I have reviewed this patient's enrollment into Apixaban Validation Study and their informed consent and agree with assessment.  Jorene Guest, PharmD

## 2015-12-13 ENCOUNTER — Encounter (HOSPITAL_COMMUNITY): Payer: Self-pay | Admitting: Emergency Medicine

## 2015-12-13 ENCOUNTER — Emergency Department (HOSPITAL_COMMUNITY)
Admission: EM | Admit: 2015-12-13 | Discharge: 2015-12-14 | Disposition: A | Discharge status: Home / Self Care | Payer: Medicare Other | Attending: Emergency Medicine | Admitting: Emergency Medicine

## 2015-12-13 ENCOUNTER — Other Ambulatory Visit: Payer: Self-pay

## 2015-12-13 ENCOUNTER — Emergency Department (HOSPITAL_COMMUNITY): Payer: Medicare Other

## 2015-12-13 DIAGNOSIS — E119 Type 2 diabetes mellitus without complications: Secondary | ICD-10-CM | POA: Insufficient documentation

## 2015-12-13 DIAGNOSIS — Z87891 Personal history of nicotine dependence: Secondary | ICD-10-CM | POA: Insufficient documentation

## 2015-12-13 DIAGNOSIS — Z79899 Other long term (current) drug therapy: Secondary | ICD-10-CM | POA: Insufficient documentation

## 2015-12-13 DIAGNOSIS — I10 Essential (primary) hypertension: Secondary | ICD-10-CM | POA: Insufficient documentation

## 2015-12-13 DIAGNOSIS — J189 Pneumonia, unspecified organism: Secondary | ICD-10-CM | POA: Diagnosis not present

## 2015-12-13 DIAGNOSIS — Z7984 Long term (current) use of oral hypoglycemic drugs: Secondary | ICD-10-CM | POA: Diagnosis not present

## 2015-12-13 DIAGNOSIS — R0602 Shortness of breath: Secondary | ICD-10-CM | POA: Diagnosis present

## 2015-12-13 MED ORDER — LEVOFLOXACIN 500 MG PO TABS: 500.0000 mg | ORAL_TABLET | Freq: Every day | ORAL | Status: DC

## 2015-12-13 MED ORDER — AZITHROMYCIN 250 MG PO TABS
500.0000 mg | ORAL_TABLET | Freq: Once | ORAL | Status: AC
  Administered 2015-12-13: 500 mg via ORAL
  Filled 2015-12-13: qty 2

## 2015-12-13 MED ORDER — AMOXICILLIN 500 MG PO CAPS
1000.0000 mg | ORAL_CAPSULE | Freq: Once | ORAL | Status: AC
  Administered 2015-12-13: 1000 mg via ORAL
  Filled 2015-12-13: qty 2

## 2015-12-13 NOTE — ED Notes (Signed)
Patient brought in by [facility] from [facility] facility. Patient has a recent hx of multiple bilateral PE and DVT in RT leg.  Reported that patient was on long term heparin and transitioned to Eliquis. Patient has had increasing number of episodes of SOB compounded by anxiety. EMS reports lungs are clear with slight rub in lt lower lobe possibly due to hx of LLL effusion. Patient o2 sat 96-97% on ra. VS improved when patient is coached to slow breathing.

## 2015-12-13 NOTE — ED Provider Notes (Signed)
CSN: [id number]     Arrival date & time 12/13/15  2049 History   First MD Initiated Contact with Patient 12/13/15 2054     Chief Complaint  Patient presents with  . Shortness of Breath  . Anxiety     (Consider location/radiation/quality/duration/timing/severity/associated sxs/prior Treatment) HPI   60yF with brought in by [facility] from [place] facility. Patient has a recent hx of multiple bilateral PE and DVT in RT leg. Reported that patient was on long term heparin and transitioned to Eliquis. Patient has had increasing number of episodes of SOB compounded by anxiety. EMS reports lungs are clear with slight rub in lt lower lobe possibly due to hx of LLL effusion.  Past Medical History  Diagnosis Date  . MS (multiple sclerosis) ([place])   . HTN (hypertension)   . DM II (diabetes mellitus, type II), controlled ([place])   . Lymphedema   . Anemia   . Gait disorder    Past Surgical History  Procedure Laterality Date  . Flexible sigmoidoscopy Left 03/09/2015    Procedure: FLEXIBLE SIGMOIDOSCOPY;  Surgeon: Carol Ada, MD;  Location: [facility];  Service: Endoscopy;  Laterality: Left;   Family History  Problem Relation Age of Onset  . Multiple sclerosis Mother    Social History  Substance Use Topics  . Smoking status: Former Smoker    Types: Cigarettes  . Smokeless tobacco: Never Used  . Alcohol Use: No   OB History    Gravida Para Term Preterm AB TAB SAB Ectopic Multiple Living   3 3 0 0 0 0 0 0 0 0      Review of Systems  All systems reviewed and negative, other than as noted in HPI.   Allergies  Sulfa antibiotics  Home Medications   Prior to Admission medications   Medication Sig Start Date End Date Taking? Authorizing Provider  acetaminophen (TYLENOL) 325 MG tablet Take 2 tablets (650 mg total) by mouth every 6 (six) hours as needed for mild pain (or Fever >/= 101). 03/12/15  Yes Robbie Lis, MD  amLODipine (NORVASC) 2.5 MG tablet Take 2.5 mg by mouth daily.  12/09/15  Yes Historical Provider, MD  apixaban (ELIQUIS) 5 MG TABS tablet Take 1 tablet (5 mg total) by mouth 2 (two) times daily. 11/03/15  Yes Charolette Forward, MD  carvedilol (COREG) 3.125 MG tablet Take 3.125 mg by mouth 2 (two) times daily. 04/25/15  Yes Historical Provider, MD  diphenhydramine-acetaminophen (TYLENOL PM) 25-500 MG TABS tablet Take 1 tablet by mouth at bedtime as needed (for sleep).   Yes Historical Provider, MD  docusate sodium (COLACE) 100 MG capsule Take 1 capsule (100 mg total) by mouth 2 (two) times daily. 05/13/15  Yes Dixie Dials, MD  glipiZIDE (GLUCOTROL) 5 MG tablet Take 1 tablet (5 mg total) by mouth 2 (two) times daily. 10/30/15  Yes Charolette Forward, MD  HYDROcodone-acetaminophen (NORCO/VICODIN) 5-325 MG per tablet Take 2 tablets by mouth every 4 (four) hours as needed for moderate pain. 03/12/15  Yes Robbie Lis, MD  losartan (COZAAR) 50 MG tablet Take 50 mg by mouth daily. 12/09/15  Yes Historical Provider, MD  methocarbamol (ROBAXIN) 500 MG tablet Take 0.5 tablets (250 mg total) by mouth every 8 (eight) hours as needed for muscle spasms. 03/12/15  Yes Robbie Lis, MD  sacubitril-valsartan (ENTRESTO) 24-26 MG Take 1 tablet by mouth 2 (two) times daily. 10/30/15  Yes Charolette Forward, MD  zolpidem (AMBIEN) 5 MG tablet Take 1 tablet (5 mg total) by  mouth at bedtime as needed for sleep (insomnia). 05/18/15  Yes Estill Dooms, MD  apixaban (ELIQUIS) 5 MG TABS tablet Take 2 tablets (10 mg total) by mouth 2 (two) times daily. 10/30/15 11/02/15  Charolette Forward, MD  nitrofurantoin, macrocrystal-monohydrate, (MACROBID) 100 MG capsule Take 1 capsule (100 mg total) by mouth every 12 (twelve) hours. Patient not taking: Reported on 12/13/2015 10/30/15   Charolette Forward, MD   BP 133/92 mmHg  Pulse 117  Temp(Src) 96.9 F (36.1 C) (Axillary)  Resp 20  SpO2 91% Physical Exam  Constitutional: She appears well-developed and well-nourished. No distress.  HENT:  Head: Normocephalic and  atraumatic.  Eyes: Conjunctivae are normal. Right eye exhibits no discharge. Left eye exhibits no discharge.  Neck: Neck supple.  Cardiovascular: Normal rate, regular rhythm and normal heart sounds.  Exam reveals no gallop and no friction rub.   No murmur heard. Pulmonary/Chest: Effort normal. No respiratory distress.  L sided rhonchi  Abdominal: Soft. She exhibits no distension. There is no tenderness.  Musculoskeletal: She exhibits no edema or tenderness.  Neurological: She is alert.  Skin: Skin is warm and dry.  Psychiatric: She has a normal mood and affect. Her behavior is normal. Thought content normal.  Nursing note and vitals reviewed.   ED Course  Procedures (including critical care time) Labs Review Labs Reviewed - No data to display  Imaging Review No results found.   Dg Chest 2 View  12/13/2015  CLINICAL DATA:  [age]one year fever and body aches EXAM: CHEST  2 VIEW COMPARISON:  Pain chest CT dated 10/26/2015 FINDINGS: The patient is tilted and somewhat rotated to the right. There is a small left pleural effusion. Left lung base density may represent atelectatic changes or pneumonia. The right lung is clear. Top-normal cardiac silhouette. No acute osseous pathology. IMPRESSION: Small left pleural effusion and left lung base atelectasis versus infiltrate. Clinical correlation is recommended. Electronically Signed   By: Anner Crete M.D.   On: 12/13/2015 22:16     EKG Interpretation   Date/Time:  Monday December 13 2015 21:06:15 EDT Ventricular Rate:  118 PR Interval:    QRS Duration: 90 QT Interval:  314 QTC Calculation: 440 R Axis:   -25 Text Interpretation:  Sinus tachycardia Probable left atrial enlargement  Borderline left axis deviation Borderline T abnormalities, lateral leads  Abnormal ekg Confirmed by BEATON  MD, ROBERT ([id number]) on 12/14/2015 3:38:54  PM      MDM   Final diagnoses:  HCAP (healthcare-associated pneumonia)    60 year old female with  cough and some shortness of breath. Chest x-ray with atelectasis versus pneumonia. Given her symptoms, will treat for possible pneumonia. At this point, I feel she is appropriate for outpatient treatment.  Virgel Manifold, MD 12/30/15 1019

## 2015-12-13 NOTE — Discharge Instructions (Signed)

## 2015-12-13 NOTE — ED Notes (Signed)
[facility] called for transport back to facility 

## 2015-12-14 NOTE — ED Notes (Signed)
Report called to Stanton Kidney, RN at [facility]

## 2016-03-27 ENCOUNTER — Encounter (HOSPITAL_BASED_OUTPATIENT_CLINIC_OR_DEPARTMENT_OTHER): Payer: Medicare Other | Attending: Internal Medicine

## 2016-03-27 ENCOUNTER — Other Ambulatory Visit: Payer: Self-pay | Admitting: Internal Medicine

## 2016-03-27 DIAGNOSIS — R29898 Other symptoms and signs involving the musculoskeletal system: Secondary | ICD-10-CM | POA: Insufficient documentation

## 2016-03-27 DIAGNOSIS — Z7901 Long term (current) use of anticoagulants: Secondary | ICD-10-CM | POA: Diagnosis not present

## 2016-03-27 DIAGNOSIS — I10 Essential (primary) hypertension: Secondary | ICD-10-CM | POA: Insufficient documentation

## 2016-03-27 DIAGNOSIS — E119 Type 2 diabetes mellitus without complications: Secondary | ICD-10-CM | POA: Diagnosis not present

## 2016-03-27 DIAGNOSIS — I890 Lymphedema, not elsewhere classified: Secondary | ICD-10-CM | POA: Diagnosis not present

## 2016-03-27 DIAGNOSIS — R29818 Other symptoms and signs involving the nervous system: Secondary | ICD-10-CM | POA: Insufficient documentation

## 2016-03-27 DIAGNOSIS — G35 Multiple sclerosis: Secondary | ICD-10-CM | POA: Insufficient documentation

## 2016-03-27 DIAGNOSIS — L89153 Pressure ulcer of sacral region, stage 3: Secondary | ICD-10-CM | POA: Diagnosis present

## 2016-04-03 DIAGNOSIS — L89153 Pressure ulcer of sacral region, stage 3: Secondary | ICD-10-CM | POA: Diagnosis not present

## 2016-04-10 DIAGNOSIS — L89153 Pressure ulcer of sacral region, stage 3: Secondary | ICD-10-CM | POA: Diagnosis not present

## 2016-04-24 ENCOUNTER — Encounter (HOSPITAL_BASED_OUTPATIENT_CLINIC_OR_DEPARTMENT_OTHER): Payer: Medicare Other

## 2016-05-01 DIAGNOSIS — D649 Anemia, unspecified: Secondary | ICD-10-CM | POA: Diagnosis not present

## 2016-05-01 DIAGNOSIS — R29818 Other symptoms and signs involving the nervous system: Secondary | ICD-10-CM | POA: Insufficient documentation

## 2016-05-01 DIAGNOSIS — R29898 Other symptoms and signs involving the musculoskeletal system: Secondary | ICD-10-CM | POA: Diagnosis not present

## 2016-05-01 DIAGNOSIS — L89154 Pressure ulcer of sacral region, stage 4: Secondary | ICD-10-CM | POA: Insufficient documentation

## 2016-05-01 DIAGNOSIS — E11622 Type 2 diabetes mellitus with other skin ulcer: Secondary | ICD-10-CM | POA: Insufficient documentation

## 2016-05-01 DIAGNOSIS — I890 Lymphedema, not elsewhere classified: Secondary | ICD-10-CM | POA: Insufficient documentation

## 2016-05-01 DIAGNOSIS — I10 Essential (primary) hypertension: Secondary | ICD-10-CM | POA: Insufficient documentation

## 2016-05-15 DIAGNOSIS — E11622 Type 2 diabetes mellitus with other skin ulcer: Secondary | ICD-10-CM | POA: Diagnosis not present

## 2016-05-29 ENCOUNTER — Encounter (HOSPITAL_BASED_OUTPATIENT_CLINIC_OR_DEPARTMENT_OTHER): Payer: Medicare Other | Attending: Internal Medicine

## 2016-05-29 DIAGNOSIS — I10 Essential (primary) hypertension: Secondary | ICD-10-CM | POA: Insufficient documentation

## 2016-05-29 DIAGNOSIS — G35 Multiple sclerosis: Secondary | ICD-10-CM | POA: Insufficient documentation

## 2016-05-29 DIAGNOSIS — L89154 Pressure ulcer of sacral region, stage 4: Secondary | ICD-10-CM | POA: Insufficient documentation

## 2016-05-29 DIAGNOSIS — E119 Type 2 diabetes mellitus without complications: Secondary | ICD-10-CM | POA: Insufficient documentation

## 2016-05-29 DIAGNOSIS — Z86718 Personal history of other venous thrombosis and embolism: Secondary | ICD-10-CM | POA: Insufficient documentation

## 2016-06-05 DIAGNOSIS — G35 Multiple sclerosis: Secondary | ICD-10-CM | POA: Diagnosis not present

## 2016-06-05 DIAGNOSIS — Z86718 Personal history of other venous thrombosis and embolism: Secondary | ICD-10-CM | POA: Diagnosis not present

## 2016-06-05 DIAGNOSIS — I10 Essential (primary) hypertension: Secondary | ICD-10-CM | POA: Diagnosis not present

## 2016-06-05 DIAGNOSIS — E119 Type 2 diabetes mellitus without complications: Secondary | ICD-10-CM | POA: Diagnosis not present

## 2016-06-05 DIAGNOSIS — L89154 Pressure ulcer of sacral region, stage 4: Secondary | ICD-10-CM | POA: Diagnosis present

## 2016-06-26 ENCOUNTER — Encounter (HOSPITAL_BASED_OUTPATIENT_CLINIC_OR_DEPARTMENT_OTHER): Payer: Medicare Other | Attending: Internal Medicine

## 2016-06-26 DIAGNOSIS — E119 Type 2 diabetes mellitus without complications: Secondary | ICD-10-CM | POA: Insufficient documentation

## 2016-06-26 DIAGNOSIS — Z86718 Personal history of other venous thrombosis and embolism: Secondary | ICD-10-CM | POA: Insufficient documentation

## 2016-06-26 DIAGNOSIS — I10 Essential (primary) hypertension: Secondary | ICD-10-CM | POA: Insufficient documentation

## 2016-06-26 DIAGNOSIS — L89154 Pressure ulcer of sacral region, stage 4: Secondary | ICD-10-CM | POA: Insufficient documentation

## 2016-06-26 DIAGNOSIS — I890 Lymphedema, not elsewhere classified: Secondary | ICD-10-CM | POA: Insufficient documentation

## 2016-07-03 DIAGNOSIS — E119 Type 2 diabetes mellitus without complications: Secondary | ICD-10-CM | POA: Diagnosis not present

## 2016-07-03 DIAGNOSIS — I890 Lymphedema, not elsewhere classified: Secondary | ICD-10-CM | POA: Diagnosis not present

## 2016-07-03 DIAGNOSIS — I10 Essential (primary) hypertension: Secondary | ICD-10-CM | POA: Diagnosis not present

## 2016-07-03 DIAGNOSIS — L89154 Pressure ulcer of sacral region, stage 4: Secondary | ICD-10-CM | POA: Diagnosis present

## 2016-07-03 DIAGNOSIS — Z86718 Personal history of other venous thrombosis and embolism: Secondary | ICD-10-CM | POA: Diagnosis not present

## 2016-07-11 ENCOUNTER — Inpatient Hospital Stay (HOSPITAL_COMMUNITY)
Admission: EM | Admit: 2016-07-11 | Discharge: 2016-07-14 | DRG: 377 | Disposition: A | Discharge status: Skilled Nursing Facility | Payer: Medicare Other | Attending: Cardiovascular Disease | Admitting: Cardiovascular Disease

## 2016-07-11 ENCOUNTER — Emergency Department (HOSPITAL_COMMUNITY): Payer: Medicare Other

## 2016-07-11 ENCOUNTER — Encounter (HOSPITAL_COMMUNITY): Payer: Self-pay | Admitting: Emergency Medicine

## 2016-07-11 DIAGNOSIS — I10 Essential (primary) hypertension: Secondary | ICD-10-CM | POA: Diagnosis present

## 2016-07-11 DIAGNOSIS — Z79891 Long term (current) use of opiate analgesic: Secondary | ICD-10-CM

## 2016-07-11 DIAGNOSIS — Z882 Allergy status to sulfonamides status: Secondary | ICD-10-CM

## 2016-07-11 DIAGNOSIS — E1151 Type 2 diabetes mellitus with diabetic peripheral angiopathy without gangrene: Secondary | ICD-10-CM | POA: Diagnosis present

## 2016-07-11 DIAGNOSIS — K920 Hematemesis: Secondary | ICD-10-CM | POA: Diagnosis not present

## 2016-07-11 DIAGNOSIS — K2100 Gastro-esophageal reflux disease with esophagitis, without bleeding: Secondary | ICD-10-CM

## 2016-07-11 DIAGNOSIS — K449 Diaphragmatic hernia without obstruction or gangrene: Secondary | ICD-10-CM | POA: Diagnosis present

## 2016-07-11 DIAGNOSIS — G35 Multiple sclerosis: Secondary | ICD-10-CM | POA: Diagnosis present

## 2016-07-11 DIAGNOSIS — Z86711 Personal history of pulmonary embolism: Secondary | ICD-10-CM

## 2016-07-11 DIAGNOSIS — Z7901 Long term (current) use of anticoagulants: Secondary | ICD-10-CM

## 2016-07-11 DIAGNOSIS — K5520 Angiodysplasia of colon without hemorrhage: Secondary | ICD-10-CM | POA: Diagnosis present

## 2016-07-11 DIAGNOSIS — D62 Acute posthemorrhagic anemia: Secondary | ICD-10-CM | POA: Diagnosis present

## 2016-07-11 DIAGNOSIS — I4891 Unspecified atrial fibrillation: Secondary | ICD-10-CM | POA: Diagnosis present

## 2016-07-11 DIAGNOSIS — Z820 Family history of epilepsy and other diseases of the nervous system: Secondary | ICD-10-CM

## 2016-07-11 DIAGNOSIS — Z87891 Personal history of nicotine dependence: Secondary | ICD-10-CM

## 2016-07-11 DIAGNOSIS — G8220 Paraplegia, unspecified: Secondary | ICD-10-CM | POA: Diagnosis present

## 2016-07-11 DIAGNOSIS — Z7984 Long term (current) use of oral hypoglycemic drugs: Secondary | ICD-10-CM

## 2016-07-11 DIAGNOSIS — K5903 Drug induced constipation: Secondary | ICD-10-CM | POA: Diagnosis present

## 2016-07-11 DIAGNOSIS — D500 Iron deficiency anemia secondary to blood loss (chronic): Secondary | ICD-10-CM

## 2016-07-11 DIAGNOSIS — K5909 Other constipation: Secondary | ICD-10-CM | POA: Diagnosis present

## 2016-07-11 DIAGNOSIS — N390 Urinary tract infection, site not specified: Secondary | ICD-10-CM | POA: Diagnosis present

## 2016-07-11 DIAGNOSIS — K31819 Angiodysplasia of stomach and duodenum without bleeding: Secondary | ICD-10-CM

## 2016-07-11 DIAGNOSIS — Z79899 Other long term (current) drug therapy: Secondary | ICD-10-CM

## 2016-07-11 DIAGNOSIS — K222 Esophageal obstruction: Secondary | ICD-10-CM

## 2016-07-11 DIAGNOSIS — T50905A Adverse effect of unspecified drugs, medicaments and biological substances, initial encounter: Secondary | ICD-10-CM | POA: Diagnosis present

## 2016-07-11 DIAGNOSIS — K922 Gastrointestinal hemorrhage, unspecified: Secondary | ICD-10-CM | POA: Diagnosis present

## 2016-07-11 DIAGNOSIS — L89314 Pressure ulcer of right buttock, stage 4: Secondary | ICD-10-CM | POA: Diagnosis present

## 2016-07-11 HISTORY — DX: Other pulmonary embolism without acute cor pulmonale: I26.99

## 2016-07-11 LAB — URINALYSIS, ROUTINE W REFLEX MICROSCOPIC
BILIRUBIN URINE: NEGATIVE
GLUCOSE, UA: NEGATIVE mg/dL
Ketones, ur: NEGATIVE mg/dL
NITRITE: POSITIVE — AB
Protein, ur: 100 mg/dL — AB
SPECIFIC GRAVITY, URINE: 1.024 (ref 1.005–1.030)
pH: 8 (ref 5.0–8.0)

## 2016-07-11 LAB — COMPREHENSIVE METABOLIC PANEL
ALK PHOS: 92 U/L (ref 38–126)
ALT: 13 U/L — AB (ref 14–54)
AST: 18 U/L (ref 15–41)
Albumin: 3.4 g/dL — ABNORMAL LOW (ref 3.5–5.0)
Anion gap: 8 (ref 5–15)
BUN: 29 mg/dL — AB (ref 6–20)
CALCIUM: 8.6 mg/dL — AB (ref 8.9–10.3)
CO2: 24 mmol/L (ref 22–32)
CREATININE: 0.35 mg/dL — AB (ref 0.44–1.00)
Chloride: 104 mmol/L (ref 101–111)
Glucose, Bld: 158 mg/dL — ABNORMAL HIGH (ref 65–99)
Potassium: 3.4 mmol/L — ABNORMAL LOW (ref 3.5–5.1)
Sodium: 136 mmol/L (ref 135–145)
Total Bilirubin: 0.4 mg/dL (ref 0.3–1.2)
Total Protein: 7.2 g/dL (ref 6.5–8.1)

## 2016-07-11 LAB — CBC
HCT: 29.2 % — ABNORMAL LOW (ref 36.0–46.0)
Hemoglobin: 8.4 g/dL — ABNORMAL LOW (ref 12.0–15.0)
MCH: 18.5 pg — ABNORMAL LOW (ref 26.0–34.0)
MCHC: 28.8 g/dL — ABNORMAL LOW (ref 30.0–36.0)
MCV: 64.5 fL — AB (ref 78.0–100.0)
PLATELETS: 653 10*3/uL — AB (ref 150–400)
RBC: 4.53 MIL/uL (ref 3.87–5.11)
RDW: 20.7 % — ABNORMAL HIGH (ref 11.5–15.5)
WBC: 15.4 10*3/uL — AB (ref 4.0–10.5)

## 2016-07-11 LAB — POC OCCULT BLOOD, ED: Fecal Occult Bld: POSITIVE — AB

## 2016-07-11 LAB — LIPASE, BLOOD: Lipase: 18 U/L (ref 11–51)

## 2016-07-11 LAB — PROTIME-INR
INR: 1.88
Prothrombin Time: 21.9 seconds — ABNORMAL HIGH (ref 11.4–15.2)

## 2016-07-11 MED ORDER — PROTHROMBIN COMPLEX CONC HUMAN 500 UNITS IV KIT
3500.0000 [IU] | PACK | INTRAVENOUS | Status: AC
  Administered 2016-07-12: 3500 [IU] via INTRAVENOUS
  Filled 2016-07-11: qty 100

## 2016-07-11 MED ORDER — PANTOPRAZOLE SODIUM 40 MG IV SOLR: 40.0000 mg | Freq: Two times a day (BID) | INTRAVENOUS | Status: DC

## 2016-07-11 MED ORDER — METOCLOPRAMIDE HCL 5 MG/ML IJ SOLN
10.0000 mg | Freq: Once | INTRAMUSCULAR | Status: AC
  Administered 2016-07-11: 10 mg via INTRAVENOUS
  Filled 2016-07-11: qty 2

## 2016-07-11 MED ORDER — SODIUM CHLORIDE 0.9 % IV SOLN: 80.0000 mg | Freq: Once | INTRAVENOUS | Status: DC

## 2016-07-11 MED ORDER — SODIUM CHLORIDE 0.9 % IV BOLUS (SEPSIS)
1000.0000 mL | Freq: Once | INTRAVENOUS | Status: AC
  Administered 2016-07-11: 1000 mL via INTRAVENOUS

## 2016-07-11 MED ORDER — SODIUM CHLORIDE 0.9 % IV SOLN
8.0000 mg/h | INTRAVENOUS | Status: DC
  Administered 2016-07-11 – 2016-07-14 (×5): 8 mg/h via INTRAVENOUS
  Filled 2016-07-11 (×13): qty 80

## 2016-07-11 MED ORDER — PROTHROMBIN COMPLEX CONC HUMAN 500 UNITS IV KIT
50.0000 [IU]/kg | PACK | Status: DC
  Filled 2016-07-11: qty 134

## 2016-07-11 MED ORDER — SODIUM CHLORIDE 0.9 % IV SOLN
80.0000 mg | Freq: Once | INTRAVENOUS | Status: AC
  Administered 2016-07-11: 23:00:00 80 mg via INTRAVENOUS
  Filled 2016-07-11: qty 80

## 2016-07-11 NOTE — ED Notes (Signed)
Bed: WA09 Expected date:  Expected time:  Means of arrival:  Comments: EMS- elderly vomiting

## 2016-07-11 NOTE — ED Notes (Signed)
Switched patients catheter bag to a new one and waiting for enough urine for a sample

## 2016-07-11 NOTE — ED Triage Notes (Signed)
Patient came from [facility]. Patient has had a vomiting episode that had coffee grounds emesis 45 minutes ago.

## 2016-07-11 NOTE — ED Provider Notes (Signed)
[facility] DEPT Provider Note   CSN: [id number] Arrival date & time: 07/11/16  2057     History   Chief Complaint Chief Complaint  Patient presents with  . Emesis    HPI Jocelyn Sanchez is a 61 y.o. female.  HPI 61 yo F with h/o MS, HTN, HLD, DM, AFib on eliquis here with coffee ground emesis. Pt is currently without complaints. Per EMS report, pt was in usual state of health until earlier this afternoon. She had dinner then reports 3 episodes of "nasty tasting" emesis, which per report had blood/coffee grounds in it. She states that she felt nauseous prior to vomiting but sx have now improved. Denies any abdominal pain. Denies known melena but has RNs that change her; she does not recall them mentioning anything. She is on eliquis. No CP, SOB, lightheadedness, or syncope.  Past Medical History:  Diagnosis Date  . Anemia   . DM II (diabetes mellitus, type II), controlled ([place])   . Gait disorder   . HTN (hypertension)   . Lymphedema   . MS (multiple sclerosis) [facility])     Patient Active Problem List   Diagnosis Date Noted  . Acute upper GI bleed 07/12/2016  . Pressure ulcer 10/27/2015  . Pulmonary emboli ([place]) 10/26/2015  . Bilateral pulmonary embolism ([place]) 10/26/2015  . PVD (peripheral vascular disease) ([place]) 07/04/2015  . Essential hypertension, benign 06/17/2015  . Decubitus ulcer of ankle, stage 4 ([place]) 05/10/2015  . Decubitus ulcer of coccygeal region 05/10/2015  . FUO (fever of unknown origin)   . Back pain   . Proctitis   . Citrobacter infection 03/08/2015  . Fever 03/08/2015  . Sepsis due to Escherichia coli with acute renal failure ([place]) 03/06/2015  . Septic shock ([place]) 03/06/2015  . E. coli UTI (urinary tract infection) 03/06/2015  . Diabetes mellitus with peripheral vascular disease ([place]) 03/06/2015  . Leukocytosis 03/06/2015  . Hypophosphatemia 03/06/2015  . Hypomagnesemia 03/06/2015  . Anemia of chronic disease 03/06/2015  . Decubitus ulcer, stage  2 03/06/2015  . Lymphedema 03/06/2015  . Hypokalemia 05/11/2011  . Multiple sclerosis ([place]) 05/11/2011    Past Surgical History:  Procedure Laterality Date  . FLEXIBLE SIGMOIDOSCOPY Left 03/09/2015   Procedure: FLEXIBLE SIGMOIDOSCOPY;  Surgeon: Carol Ada, MD;  Location: [facility];  Service: Endoscopy;  Laterality: Left;    OB History    Gravida Para Term Preterm AB Living   3 3 0 0 0 0   SAB TAB Ectopic Multiple Live Births   0 0 0 0         Home Medications    Prior to Admission medications   Medication Sig Start Date End Date Taking? Authorizing Provider  acetaminophen (TYLENOL) 325 MG tablet Take 2 tablets (650 mg total) by mouth every 6 (six) hours as needed for mild pain (or Fever >/= 101). 03/12/15  Yes Robbie Lis, MD  ALPRAZolam Duanne Moron) 0.5 MG tablet Take 0.5 mg by mouth 2 (two) times daily as needed for anxiety.   Yes Historical Provider, MD  Amino Acids-Protein Hydrolys (FEEDING SUPPLEMENT, PRO-STAT SUGAR FREE 64,) LIQD Take 30 mLs by mouth 2 (two) times daily.   Yes Historical Provider, MD  apixaban (ELIQUIS) 5 MG TABS tablet Take 1 tablet (5 mg total) by mouth 2 (two) times daily. 11/03/15  Yes Charolette Forward, MD  carvedilol (COREG) 3.125 MG tablet Take 3.125 mg by mouth 2 (two) times daily. 04/25/15  Yes Historical Provider, MD  cholecalciferol (VITAMIN D) 1000 units  tablet Take 2,000 Units by mouth daily.   Yes Historical Provider, MD  diphenhydramine-acetaminophen (TYLENOL PM) 25-500 MG TABS tablet Take 1 tablet by mouth at bedtime as needed (for sleep).   Yes Historical Provider, MD  docusate sodium (COLACE) 100 MG capsule Take 1 capsule (100 mg total) by mouth 2 (two) times daily. 05/13/15  Yes Dixie Dials, MD  furosemide (LASIX) 20 MG tablet Take 20 mg by mouth daily. On Monday, Wednesday and Friday   Yes Historical Provider, MD  glipiZIDE (GLUCOTROL) 5 MG tablet Take 1 tablet (5 mg total) by mouth 2 (two) times daily. 10/30/15  Yes Charolette Forward, MD    HYDROcodone-acetaminophen (NORCO/VICODIN) 5-325 MG per tablet Take 2 tablets by mouth every 4 (four) hours as needed for moderate pain. 03/12/15  Yes Robbie Lis, MD  loratadine (CLARITIN) 10 MG tablet Take 10 mg by mouth daily.   Yes Historical Provider, MD  methocarbamol (ROBAXIN) 500 MG tablet Take 0.5 tablets (250 mg total) by mouth every 8 (eight) hours as needed for muscle spasms. 03/12/15  Yes Robbie Lis, MD  mirtazapine (REMERON) 7.5 MG tablet Take 7.5 mg by mouth at bedtime.   Yes Historical Provider, MD  Multiple Vitamins-Minerals (DECUBI-VITE PO) Take 1 capsule by mouth daily.   Yes Historical Provider, MD  oxybutynin (DITROPAN-XL) 5 MG 24 hr tablet Take 5 mg by mouth at bedtime.   Yes Historical Provider, MD  potassium chloride (K-DUR,KLOR-CON) 10 MEQ tablet Take 10 mEq by mouth daily. On Monday, Wednesday and Friday   Yes Historical Provider, MD  promethazine (PHENERGAN) 25 MG tablet Take 25 mg by mouth every 6 (six) hours as needed for nausea or vomiting.   Yes Historical Provider, MD  sacubitril-valsartan (ENTRESTO) 24-26 MG Take 1 tablet by mouth 2 (two) times daily. 10/30/15  Yes Charolette Forward, MD  sennosides-docusate sodium (SENOKOT-S) 8.6-50 MG tablet Take 2 tablets by mouth daily.   Yes Historical Provider, MD  vitamin C (ASCORBIC ACID) 500 MG tablet Take 500 mg by mouth 2 (two) times daily.   Yes Historical Provider, MD  zolpidem (AMBIEN) 5 MG tablet Take 1 tablet (5 mg total) by mouth at bedtime as needed for sleep (insomnia). 05/18/15  Yes Estill Dooms, MD  apixaban (ELIQUIS) 5 MG TABS tablet Take 2 tablets (10 mg total) by mouth 2 (two) times daily. 10/30/15 11/02/15  Charolette Forward, MD  levofloxacin (LEVAQUIN) 500 MG tablet Take 1 tablet (500 mg total) by mouth daily. Patient not taking: Reported on 07/11/2016 12/13/15   Virgel Manifold, MD  nitrofurantoin, macrocrystal-monohydrate, (MACROBID) 100 MG capsule Take 1 capsule (100 mg total) by mouth every 12 (twelve)  hours. Patient not taking: Reported on 12/13/2015 10/30/15   Charolette Forward, MD    Family History Family History  Problem Relation Age of Onset  . Multiple sclerosis Mother     Social History Social History  Substance Use Topics  . Smoking status: Former Smoker    Types: Cigarettes  . Smokeless tobacco: Never Used  . Alcohol use No     Allergies   Sulfa antibiotics   Review of Systems Review of Systems  Constitutional: Negative for chills, fatigue and fever.  HENT: Negative for congestion, rhinorrhea and sore throat.   Eyes: Negative for visual disturbance.  Respiratory: Negative for cough, shortness of breath and wheezing.   Cardiovascular: Negative for chest pain and leg swelling.  Gastrointestinal: Positive for nausea and vomiting. Negative for abdominal pain and diarrhea.  Genitourinary: Negative for  dysuria, flank pain, vaginal bleeding and vaginal discharge.  Musculoskeletal: Negative for neck pain.  Skin: Negative for rash.  Allergic/Immunologic: Negative for immunocompromised state.  Neurological: Negative for syncope and headaches.  Hematological: Does not bruise/bleed easily.  All other systems reviewed and are negative.    Physical Exam Updated Vital Signs BP (!) 96/54   Pulse 95   Temp 98.4 F (36.9 C) (Oral)   Resp 15   Ht 5\' 3"  (1.6 m)   Wt 148 lb (67.1 kg)   SpO2 100%   BMI 26.22 kg/m   Physical Exam  Constitutional: She is oriented to person, place, and time. She appears well-developed and well-nourished. No distress.  HENT:  Head: Normocephalic and atraumatic.  Eyes: Conjunctivae are normal.  Neck: Neck supple.  Cardiovascular: Normal rate, regular rhythm and normal heart sounds.  Exam reveals no friction rub.   No murmur heard. Pulmonary/Chest: Effort normal and breath sounds normal. No respiratory distress. She has no wheezes. She has no rales.  Abdominal: Soft. Bowel sounds are normal. She exhibits no distension. There is no tenderness.  There is no rebound and no guarding.  Genitourinary:  Genitourinary Comments: Melenotic stool, no bright red blood.  Musculoskeletal: She exhibits no edema.  Neurological: She is alert and oriented to person, place, and time. She exhibits normal muscle tone.  Skin: Skin is warm. Capillary refill takes less than 2 seconds.  Psychiatric: She has a normal mood and affect.  Nursing note and vitals reviewed.    ED Treatments / Results  Labs (all labs ordered are listed, but only abnormal results are displayed) Labs Reviewed  COMPREHENSIVE METABOLIC PANEL - Abnormal; Notable for the following:       Result Value   Potassium 3.4 (*)    Glucose, Bld 158 (*)    BUN 29 (*)    Creatinine, Ser 0.35 (*)    Calcium 8.6 (*)    Albumin 3.4 (*)    ALT 13 (*)    All other components within normal limits  CBC - Abnormal; Notable for the following:    WBC 15.4 (*)    Hemoglobin 8.4 (*)    HCT 29.2 (*)    MCV 64.5 (*)    MCH 18.5 (*)    MCHC 28.8 (*)    RDW 20.7 (*)    Platelets 653 (*)    All other components within normal limits  URINALYSIS, ROUTINE W REFLEX MICROSCOPIC - Abnormal; Notable for the following:    APPearance CLOUDY (*)    Hgb urine dipstick SMALL (*)    Protein, ur 100 (*)    Nitrite POSITIVE (*)    Leukocytes, UA LARGE (*)    Bacteria, UA MANY (*)    Squamous Epithelial / LPF 0-5 (*)    All other components within normal limits  PROTIME-INR - Abnormal; Notable for the following:    Prothrombin Time 21.9 (*)    All other components within normal limits  POC OCCULT BLOOD, ED - Abnormal; Notable for the following:    Fecal Occult Bld POSITIVE (*)    All other components within normal limits  LIPASE, BLOOD  CBC  BASIC METABOLIC PANEL  POC OCCULT BLOOD, ED  TYPE AND SCREEN  PREPARE RBC (CROSSMATCH)    EKG  EKG Interpretation None       Radiology Dg Abdomen Acute W/chest  Result Date: 07/11/2016 CLINICAL DATA:  Acute onset of coffee-ground vomiting. Initial  encounter. EXAM: DG ABDOMEN ACUTE W/ 1V CHEST COMPARISON:  Chest radiograph performed 12/13/2015 FINDINGS: The lungs are well-aerated and clear. There is no evidence of focal opacification, pleural effusion or pneumothorax. The cardiomediastinal silhouette is within normal limits. The visualized bowel gas pattern is unremarkable. Scattered stool and air are seen within the colon; there is no evidence of small bowel dilatation to suggest obstruction. No free intra-abdominal air is identified on the provided upright view. No acute osseous abnormalities are seen; the sacroiliac joints are unremarkable in appearance. IMPRESSION: 1. Unremarkable bowel gas pattern; no free intra-abdominal air seen. Moderate amount of stool noted in the colon. 2. No acute cardiopulmonary process seen. Electronically Signed   By: Garald Balding M.D.   On: 07/11/2016 23:20    Procedures Procedures (including critical care time)  Medications Ordered in ED Medications  pantoprazole (PROTONIX) 80 mg in sodium chloride 0.9 % 250 mL (0.32 mg/mL) infusion (8 mg/hr Intravenous Transfusing/Transfer 07/12/16 0230)  pantoprazole (PROTONIX) injection 40 mg (not administered)  sodium chloride flush (NS) 0.9 % injection 3 mL (3 mLs Intravenous Given 07/12/16 0117)  0.9 % NaCl with KCl 20 mEq/ L  infusion ( Intravenous Transfusing/Transfer 07/12/16 0230)  sodium chloride 0.9 % bolus 1,000 mL (1,000 mLs Intravenous Transfusing/Transfer 07/12/16 0230)  metoCLOPramide (REGLAN) injection 10 mg (10 mg Intravenous Given 07/11/16 2313)  pantoprazole (PROTONIX) 80 mg in sodium chloride 0.9 % 100 mL IVPB (0 mg Intravenous Stopped 07/12/16 0006)  prothrombin complex conc human (KCENTRA) IVPB 3,500 Units (3,500 Units Intravenous Given 07/12/16 0111)  0.9 %  sodium chloride infusion ( Intravenous Transfusing/Transfer 07/12/16 0230)    Emergency Ultrasound Study:   Angiocath insertion Performed by: Evonnie Pat Consent: Verbal consent/emergent consent  obtained. Risks and benefits: risks, benefits and alternatives were discussed Immediately prior to procedure the correct patient, procedure, equipment, support staff and site/side marked as needed.  Indication: difficult IV access Preparation: Patient was prepped and draped in the usual sterile fashion. Sterile gel was used for this procedure and the ultrasound probe was sterilized prior to use. Vein Location: Right brachial vein was visualized during assessment for potential access sites and was found to be patent/ easily compressed with linear ultrasound.  The needle was visualized with real-time ultrasound and guided into the vein. Gauge: 20  Image saved and stored.  Normal blood return.   Patient tolerance: Patient tolerated the procedure well with no immediate complications.        Initial Impression / Assessment and Plan / ED Course  I have reviewed the triage vital signs and the nursing notes.  Pertinent labs & imaging results that were available during my care of the patient were reviewed by me and considered in my medical decision making (see chart for details).    61 yo F with PMH of AFib, MS, here with coffee-ground emesis x 3-4 episodes. Exam as above - abdomen soft, rectal shows melena without bright red blood. Labs c/f significant drop in hgb from 15.7 to 8.4. C/f active UGIB on Eliquis. Discussed with GI, Dr. Doylene Canard. Will start PPI bolus and gtt, KCentra, and admit to SDU. PIV placed x 2. Pt HDS.  Final Clinical Impressions(s) / ED Diagnoses   Final diagnoses:  Blood loss anemia  UGIB (upper gastrointestinal bleed)    New Prescriptions New Prescriptions   No medications on file     Duffy Bruce, MD 07/12/16 1207

## 2016-07-12 ENCOUNTER — Encounter (HOSPITAL_COMMUNITY)
Admission: EM | Disposition: A | Discharge status: Skilled Nursing Facility | Payer: Self-pay | Source: Home / Self Care | Attending: Cardiovascular Disease

## 2016-07-12 ENCOUNTER — Inpatient Hospital Stay (HOSPITAL_COMMUNITY): Payer: Medicare Other | Admitting: Certified Registered Nurse Anesthetist

## 2016-07-12 ENCOUNTER — Encounter (HOSPITAL_COMMUNITY): Payer: Self-pay | Admitting: Nurse Practitioner

## 2016-07-12 DIAGNOSIS — K920 Hematemesis: Principal | ICD-10-CM

## 2016-07-12 DIAGNOSIS — E1151 Type 2 diabetes mellitus with diabetic peripheral angiopathy without gangrene: Secondary | ICD-10-CM | POA: Diagnosis present

## 2016-07-12 DIAGNOSIS — G8220 Paraplegia, unspecified: Secondary | ICD-10-CM | POA: Diagnosis present

## 2016-07-12 DIAGNOSIS — Z87891 Personal history of nicotine dependence: Secondary | ICD-10-CM | POA: Diagnosis not present

## 2016-07-12 DIAGNOSIS — K922 Gastrointestinal hemorrhage, unspecified: Secondary | ICD-10-CM | POA: Diagnosis present

## 2016-07-12 DIAGNOSIS — D62 Acute posthemorrhagic anemia: Secondary | ICD-10-CM | POA: Diagnosis present

## 2016-07-12 DIAGNOSIS — K5903 Drug induced constipation: Secondary | ICD-10-CM | POA: Diagnosis present

## 2016-07-12 DIAGNOSIS — K449 Diaphragmatic hernia without obstruction or gangrene: Secondary | ICD-10-CM | POA: Diagnosis present

## 2016-07-12 DIAGNOSIS — I10 Essential (primary) hypertension: Secondary | ICD-10-CM | POA: Diagnosis present

## 2016-07-12 DIAGNOSIS — N390 Urinary tract infection, site not specified: Secondary | ICD-10-CM | POA: Diagnosis present

## 2016-07-12 DIAGNOSIS — K31819 Angiodysplasia of stomach and duodenum without bleeding: Secondary | ICD-10-CM | POA: Diagnosis present

## 2016-07-12 DIAGNOSIS — T50905A Adverse effect of unspecified drugs, medicaments and biological substances, initial encounter: Secondary | ICD-10-CM | POA: Diagnosis present

## 2016-07-12 DIAGNOSIS — D689 Coagulation defect, unspecified: Secondary | ICD-10-CM | POA: Diagnosis not present

## 2016-07-12 DIAGNOSIS — K2100 Gastro-esophageal reflux disease with esophagitis, without bleeding: Secondary | ICD-10-CM

## 2016-07-12 DIAGNOSIS — L89314 Pressure ulcer of right buttock, stage 4: Secondary | ICD-10-CM | POA: Diagnosis present

## 2016-07-12 DIAGNOSIS — Z86711 Personal history of pulmonary embolism: Secondary | ICD-10-CM | POA: Diagnosis not present

## 2016-07-12 DIAGNOSIS — I4891 Unspecified atrial fibrillation: Secondary | ICD-10-CM | POA: Diagnosis present

## 2016-07-12 DIAGNOSIS — G35 Multiple sclerosis: Secondary | ICD-10-CM | POA: Diagnosis present

## 2016-07-12 DIAGNOSIS — K222 Esophageal obstruction: Secondary | ICD-10-CM

## 2016-07-12 DIAGNOSIS — Z79891 Long term (current) use of opiate analgesic: Secondary | ICD-10-CM | POA: Diagnosis not present

## 2016-07-12 DIAGNOSIS — Z7901 Long term (current) use of anticoagulants: Secondary | ICD-10-CM | POA: Diagnosis not present

## 2016-07-12 DIAGNOSIS — K5520 Angiodysplasia of colon without hemorrhage: Secondary | ICD-10-CM | POA: Diagnosis present

## 2016-07-12 DIAGNOSIS — Z7984 Long term (current) use of oral hypoglycemic drugs: Secondary | ICD-10-CM | POA: Diagnosis not present

## 2016-07-12 DIAGNOSIS — Z79899 Other long term (current) drug therapy: Secondary | ICD-10-CM | POA: Diagnosis not present

## 2016-07-12 DIAGNOSIS — K5909 Other constipation: Secondary | ICD-10-CM | POA: Diagnosis present

## 2016-07-12 HISTORY — PX: ESOPHAGOGASTRODUODENOSCOPY: SHX5428

## 2016-07-12 LAB — GLUCOSE, CAPILLARY: Glucose-Capillary: 81 mg/dL (ref 65–99)

## 2016-07-12 LAB — BASIC METABOLIC PANEL
ANION GAP: 6 (ref 5–15)
BUN: 16 mg/dL (ref 6–20)
CHLORIDE: 111 mmol/L (ref 101–111)
CO2: 21 mmol/L — ABNORMAL LOW (ref 22–32)
Calcium: 7.9 mg/dL — ABNORMAL LOW (ref 8.9–10.3)
Creatinine, Ser: <0.3 mg/dL — ABNORMAL LOW (ref 0.44–1.00)
Glucose, Bld: 91 mg/dL (ref 65–99)
POTASSIUM: 3.6 mmol/L (ref 3.5–5.1)
SODIUM: 138 mmol/L (ref 135–145)

## 2016-07-12 LAB — CBC
HCT: 28.9 % — ABNORMAL LOW (ref 36.0–46.0)
HEMOGLOBIN: 8.8 g/dL — AB (ref 12.0–15.0)
MCH: 21 pg — AB (ref 26.0–34.0)
MCHC: 30.4 g/dL (ref 30.0–36.0)
MCV: 68.8 fL — ABNORMAL LOW (ref 78.0–100.0)
PLATELETS: 454 10*3/uL — AB (ref 150–400)
RBC: 4.2 MIL/uL (ref 3.87–5.11)
RDW: 22.4 % — ABNORMAL HIGH (ref 11.5–15.5)
WBC: 9.3 10*3/uL (ref 4.0–10.5)

## 2016-07-12 LAB — PREPARE RBC (CROSSMATCH)

## 2016-07-12 SURGERY — EGD (ESOPHAGOGASTRODUODENOSCOPY)
Anesthesia: Monitor Anesthesia Care

## 2016-07-12 MED ORDER — PROPOFOL 10 MG/ML IV BOLUS
INTRAVENOUS | Status: AC
  Filled 2016-07-12: qty 40

## 2016-07-12 MED ORDER — LIDOCAINE 2% (20 MG/ML) 5 ML SYRINGE
INTRAMUSCULAR | Status: AC
  Filled 2016-07-12: qty 5

## 2016-07-12 MED ORDER — ONDANSETRON HCL 4 MG/2ML IJ SOLN
INTRAMUSCULAR | Status: AC
  Filled 2016-07-12: qty 2

## 2016-07-12 MED ORDER — SODIUM CHLORIDE 0.9% FLUSH
3.0000 mL | Freq: Two times a day (BID) | INTRAVENOUS | Status: DC
  Administered 2016-07-12 – 2016-07-13 (×3): 3 mL via INTRAVENOUS

## 2016-07-12 MED ORDER — BOOST / RESOURCE BREEZE PO LIQD
1.0000 | Freq: Three times a day (TID) | ORAL | Status: DC
  Administered 2016-07-12 – 2016-07-14 (×5): 1 via ORAL

## 2016-07-12 MED ORDER — ONDANSETRON HCL 4 MG/2ML IJ SOLN
INTRAMUSCULAR | Status: DC | PRN
  Administered 2016-07-12: 4 mg via INTRAVENOUS

## 2016-07-12 MED ORDER — SODIUM CHLORIDE 0.9 % IV SOLN: INTRAVENOUS | Status: DC

## 2016-07-12 MED ORDER — ADULT MULTIVITAMIN W/MINERALS CH
1.0000 | ORAL_TABLET | Freq: Every day | ORAL | Status: DC
  Administered 2016-07-13 – 2016-07-14 (×2): 1 via ORAL
  Filled 2016-07-12 (×2): qty 1

## 2016-07-12 MED ORDER — LACTATED RINGERS IV SOLN
INTRAVENOUS | Status: DC
  Administered 2016-07-12: 1000 mL via INTRAVENOUS

## 2016-07-12 MED ORDER — LIDOCAINE 2% (20 MG/ML) 5 ML SYRINGE
INTRAMUSCULAR | Status: DC | PRN
  Administered 2016-07-12: 100 mg via INTRAVENOUS

## 2016-07-12 MED ORDER — PROPOFOL 500 MG/50ML IV EMUL
INTRAVENOUS | Status: DC | PRN
  Administered 2016-07-12: 75 ug/kg/min via INTRAVENOUS

## 2016-07-12 MED ORDER — UNJURY CHICKEN SOUP POWDER
2.0000 [oz_av] | Freq: Two times a day (BID) | ORAL | Status: DC
  Filled 2016-07-12 (×6): qty 27

## 2016-07-12 MED ORDER — HYDROCODONE-ACETAMINOPHEN 5-325 MG PO TABS
1.0000 | ORAL_TABLET | Freq: Four times a day (QID) | ORAL | Status: DC | PRN
  Administered 2016-07-12 – 2016-07-14 (×6): 1 via ORAL
  Filled 2016-07-12 (×7): qty 1

## 2016-07-12 MED ORDER — POTASSIUM CHLORIDE IN NACL 20-0.9 MEQ/L-% IV SOLN
INTRAVENOUS | Status: DC
  Administered 2016-07-12 – 2016-07-13 (×4): via INTRAVENOUS
  Filled 2016-07-12 (×6): qty 1000

## 2016-07-12 MED ORDER — PROPOFOL 10 MG/ML IV BOLUS
INTRAVENOUS | Status: DC | PRN
  Administered 2016-07-12: 20 mg via INTRAVENOUS
  Administered 2016-07-12 (×3): 10 mg via INTRAVENOUS
  Administered 2016-07-12 (×2): 20 mg via INTRAVENOUS

## 2016-07-12 MED ORDER — SODIUM CHLORIDE 0.9 % IV SOLN
Freq: Once | INTRAVENOUS | Status: AC
  Administered 2016-07-12: 01:00:00 via INTRAVENOUS

## 2016-07-12 NOTE — Op Note (Signed)
[facility] [name] Procedure Date: 07/12/2016 MRN: [id number] Attending MD: Docia Chuck. Henrene Pastor , MD Date of Birth: 06-27-1955 CSN: [id number] Age: 61 Admit Type: Inpatient Procedure:                Upper GI endoscopy Indications:              Coffee-ground emesis area one episode that was                            minor and self-limited. Providers:                Docia Chuck. Henrene Pastor, MD, Hilma Favors, RN, Elspeth Cho Tech., Technician, Adair Laundry, CRNA Referring MD:             [facility] Medicines:                Monitored Anesthesia Care Complications:            No immediate complications. Estimated Blood Loss:     Estimated blood loss: none. Procedure:                Pre-Anesthesia Assessment:                           - Prior to the procedure, a History and Physical                            was performed, and patient medications and                            allergies were reviewed. The patient's tolerance of                            previous anesthesia was also reviewed. The risks                            and benefits of the procedure and the sedation                            options and risks were discussed with the patient.                            All questions were answered, and informed consent                            was obtained. Prior Anticoagulants: The patient has                            taken Eliquis (apixaban), last dose was 1 day prior                            to procedure. ASA Grade Assessment: III - A patient  with severe systemic disease. After reviewing the                            risks and benefits, the patient was deemed in                            satisfactory condition to undergo the procedure.                           After obtaining informed consent, the endoscope was                            passed under direct vision. Throughout the                            procedure, the patient's blood pressure, pulse, and                            oxygen saturations were monitored continuously. The                            [id number]) scope was introduced through the                            mouth, and advanced to the second part of duodenum.                            The upper GI endoscopy was accomplished without                            difficulty. The patient tolerated the procedure                            well. Scope In: Scope Out: Findings:      Non-severe esophagitis was found at the gastroesophageal junction as       manifested by slight friability and edema. Consistent with reflux       etiology.      One mild benign-appearing, intrinsic stenosis was found. This measured       1.5 cm (inner diameter) and was traversed.      The exam of the esophagus was otherwise normal.      The entire examined stomach was normal, except for a sliding hiatal       hernia. No blood in the upper gut.      A single less than 1 mm angiodysplastic lesion without bleeding was       found in the duodenal bulb.      The exam of the duodenum was otherwise normal. Impression:               1. GERD with mild esophagitis an incidental                            esophageal stricture  2. 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 Otherwise unremarkable exam with no significant                            mucosal pathology. Moderate Sedation:      none Recommendation:           1. Recommend daily PPI such as pantoprazole 40 mg                            INDEFINITELY the patient with esophagitis,                            stricture, and the need for chronic anticoagulation                            therapy                           2. Okay to resume chronic anticoagulation therapy                            if medically necessary                           3. Resume  patient's usual diet                           4. GI will sign off. Available as needed for                            questions or problems Procedure Code(s):        --- Professional ---                           [id number], Esophagogastroduodenoscopy, flexible,                            transoral; diagnostic, including collection of                            specimen(s) by brushing or washing, when performed                            (separate procedure) Diagnosis Code(s):        --- Professional ---                           K21.0, Gastro-esophageal reflux disease with                            esophagitis                           K22.2, Esophageal obstruction  K31.819, Angiodysplasia of stomach and duodenum                            without bleeding                           K92.0, Hematemesis CPT copyright 2016 American Medical Association. All rights reserved. The codes documented in this report are preliminary and upon coder review may  be revised to meet current compliance requirements. Docia Chuck. Henrene Pastor, MD 07/12/2016 2:56:36 PM This report has been signed electronically. Number of Addenda: 0

## 2016-07-12 NOTE — Consult Note (Signed)
[facility] Nurse wound consult note Reason for Consult:Stage 4 pressure injury, chronic, non-healing.  Assessed today with Dawna Part, RN. Wound type:Pressure Pressure Injury POA: Yes Measurement:2cm x 3cm x 2cm with undermining at 9 o'clock measuring 1cm, at 12 o'clock measuring 2.5cm and at 3 o'clock measuring 4.5cm Wound bed: red, moist Drainage (amount, consistency, odor) moderate amount of serous exudate on old dressing Periwound:With evidence of contraction and wound healing Dressing procedure/placement/frequency: A therapeutic mattress with low air loss feature will be provided today (patient uses one in her SNF).  We discuss pressure redistribution heel boots, but patient would prefer we try floatation of heels first as she is not comfortable in the boots (she has tried them in the past). I will employ a conservative POC for the Stage 4 pressure injury (that is healing, contraction and scarring are evident in the periwound tissue) due to fecal incontinence.  Twice daily and as needed saline gauze packing with dry topper.  [facility] nursing team will not follow, but will remain available to this patient, the nursing and medical teams.  Please re-consult if needed. Thanks, Maudie Flakes, MSN, RN, [facility], Arther Abbott  Pager# [phone]

## 2016-07-12 NOTE — Consult Note (Signed)
Referring Provider:  Dixie Dials, MD Primary Care Physician:  [name], MD Primary Gastroenterologist:   Althia Forts  Reason for Consultation: GI bleed  ASSESSMENT AND PLAN:   40. 61 yo nursing home resident admitted with GI bleed on Eliquis. One episode of coffee ground emesis and some tarry stools yesterday but admitting note. BUN elevated and she had had a marked drop in hgb.  -continue PPI gtt -She will need EGD for further evaluation. Last PO intake 2.5 hours ago. NPO now  2. ABL. Hgb 8.4, down from 15.7 baseline. Received unit of blood this am. Follow up H&H not available -continue to monitor H&H and for recurrent overt bleeding. Transfuse as needed.  3. Hx of PE ( I added this to her PMH). On Eliquis.  -Eliquis on hold  4. Heart failure hx?  Coreg and Delene Loll is on her home med list but PMH doesn't mention heart failure. She had EF 20% back in May but this was also around time she had a PE with right heart strain.     4. Multiple sclerosis. Patient non-ambulatory.   5. Chronic constipation, medication related  6. DM2    HPI: Jocelyn Sanchez is a 61 y.o. female, admitted from nursing home after episode of coffee ground emesis and dark tarry stool yesterday. She is on Eliquis for hx of PEPatient states residents have had vomiting and diarrhea at facility but she had actually been constipated. Last evening, after being disimpacted, she vomiting large amount of coffee ground material. She had no associated abdominal pain Her hgb was 8.4, down from baseline of 15.7. She was asymptomatic without dizziness, SOB.Marland Kitchen No NSAID use. No history of PUD. Denies any chronic GI problems other than chronic constipation related to medications. Her appetite is fine.   Past Medical History:  Diagnosis Date  . Anemia   . DM II (diabetes mellitus, type II), controlled ([place])   . Gait disorder   . HTN (hypertension)   . Lymphedema   . MS (multiple sclerosis) ([place])   . Pulmonary embolism  [facility])    Past Surgical History:  Procedure Laterality Date  . FLEXIBLE SIGMOIDOSCOPY Left 03/09/2015   Procedure: FLEXIBLE SIGMOIDOSCOPY;  Surgeon: Carol Ada, MD;  Location: [facility];  Service: Endoscopy;  Laterality: Left;    Prior to Admission medications   Medication Sig Start Date End Date Taking? Authorizing Provider  acetaminophen (TYLENOL) 325 MG tablet Take 2 tablets (650 mg total) by mouth every 6 (six) hours as needed for mild pain (or Fever >/= 101). 03/12/15  Yes Robbie Lis, MD  ALPRAZolam Duanne Moron) 0.5 MG tablet Take 0.5 mg by mouth 2 (two) times daily as needed for anxiety.   Yes Historical Provider, MD  Amino Acids-Protein Hydrolys (FEEDING SUPPLEMENT, PRO-STAT SUGAR FREE 64,) LIQD Take 30 mLs by mouth 2 (two) times daily.   Yes Historical Provider, MD  apixaban (ELIQUIS) 5 MG TABS tablet Take 1 tablet (5 mg total) by mouth 2 (two) times daily. 11/03/15  Yes Charolette Forward, MD  carvedilol (COREG) 3.125 MG tablet Take 3.125 mg by mouth 2 (two) times daily. 04/25/15  Yes Historical Provider, MD  cholecalciferol (VITAMIN D) 1000 units tablet Take 2,000 Units by mouth daily.   Yes Historical Provider, MD  diphenhydramine-acetaminophen (TYLENOL PM) 25-500 MG TABS tablet Take 1 tablet by mouth at bedtime as needed (for sleep).   Yes Historical Provider, MD  docusate sodium (COLACE) 100 MG capsule Take 1 capsule (100 mg total) by mouth 2 (  two) times daily. 05/13/15  Yes Dixie Dials, MD  furosemide (LASIX) 20 MG tablet Take 20 mg by mouth daily. On Monday, Wednesday and Friday   Yes Historical Provider, MD  glipiZIDE (GLUCOTROL) 5 MG tablet Take 1 tablet (5 mg total) by mouth 2 (two) times daily. 10/30/15  Yes Charolette Forward, MD  HYDROcodone-acetaminophen (NORCO/VICODIN) 5-325 MG per tablet Take 2 tablets by mouth every 4 (four) hours as needed for moderate pain. 03/12/15  Yes Robbie Lis, MD  loratadine (CLARITIN) 10 MG tablet Take 10 mg by mouth daily.   Yes Historical Provider, MD    methocarbamol (ROBAXIN) 500 MG tablet Take 0.5 tablets (250 mg total) by mouth every 8 (eight) hours as needed for muscle spasms. 03/12/15  Yes Robbie Lis, MD  mirtazapine (REMERON) 7.5 MG tablet Take 7.5 mg by mouth at bedtime.   Yes Historical Provider, MD  Multiple Vitamins-Minerals (DECUBI-VITE PO) Take 1 capsule by mouth daily.   Yes Historical Provider, MD  oxybutynin (DITROPAN-XL) 5 MG 24 hr tablet Take 5 mg by mouth at bedtime.   Yes Historical Provider, MD  potassium chloride (K-DUR,KLOR-CON) 10 MEQ tablet Take 10 mEq by mouth daily. On Monday, Wednesday and Friday   Yes Historical Provider, MD  promethazine (PHENERGAN) 25 MG tablet Take 25 mg by mouth every 6 (six) hours as needed for nausea or vomiting.   Yes Historical Provider, MD  sacubitril-valsartan (ENTRESTO) 24-26 MG Take 1 tablet by mouth 2 (two) times daily. 10/30/15  Yes Charolette Forward, MD  sennosides-docusate sodium (SENOKOT-S) 8.6-50 MG tablet Take 2 tablets by mouth daily.   Yes Historical Provider, MD  vitamin C (ASCORBIC ACID) 500 MG tablet Take 500 mg by mouth 2 (two) times daily.   Yes Historical Provider, MD  zolpidem (AMBIEN) 5 MG tablet Take 1 tablet (5 mg total) by mouth at bedtime as needed for sleep (insomnia). 05/18/15  Yes Estill Dooms, MD  apixaban (ELIQUIS) 5 MG TABS tablet Take 2 tablets (10 mg total) by mouth 2 (two) times daily. 10/30/15 11/02/15  Charolette Forward, MD  levofloxacin (LEVAQUIN) 500 MG tablet Take 1 tablet (500 mg total) by mouth daily. Patient not taking: Reported on 07/11/2016 12/13/15   Virgel Manifold, MD  nitrofurantoin, macrocrystal-monohydrate, (MACROBID) 100 MG capsule Take 1 capsule (100 mg total) by mouth every 12 (twelve) hours. Patient not taking: Reported on 12/13/2015 10/30/15   Charolette Forward, MD    Current Facility-Administered Medications  Medication Dose Route Frequency Provider Last Rate Last Dose  . 0.9 % NaCl with KCl 20 mEq/ L  infusion   Intravenous Continuous Dixie Dials, MD  100 mL/hr at 07/12/16 0228    . pantoprazole (PROTONIX) 80 mg in sodium chloride 0.9 % 250 mL (0.32 mg/mL) infusion  8 mg/hr Intravenous Continuous Duffy Bruce, MD 25 mL/hr at 07/11/16 2343 8 mg/hr at 07/11/16 2343  . [START ON 07/15/2016] pantoprazole (PROTONIX) injection 40 mg  40 mg Intravenous Q12H Duffy Bruce, MD      . sodium chloride flush (NS) 0.9 % injection 3 mL  3 mL Intravenous Q12H Dixie Dials, MD   3 mL at 07/12/16 0117    Allergies as of 07/11/2016 - Review Complete 07/11/2016  Allergen Reaction Noted  . Sulfa antibiotics Shortness Of Breath and Swelling 05/10/2011    Family History  Problem Relation Age of Onset  . Multiple sclerosis Mother     Social History   Social History  . Marital status: Single    Spouse  name: N/A  . Number of children: 3  . Years of education: N/A   Occupational History  .  Unemployed   Social History Main Topics  . Smoking status: Former Smoker    Types: Cigarettes  . Smokeless tobacco: Never Used  . Alcohol use No  . Drug use: No  . Sexual activity: No   Other Topics Concern  . Not on file   Social History Narrative   Patient is single.   Patient has three children.   Patient is disabled.   Patient is right-handed.   Patient does not drink any caffeine.    Review of Systems: All systems reviewed and negative except where noted in HPI.  Physical Exam: Vital signs in last 24 hours: Temp:  [97.9 F (36.6 C)-98.9 F (37.2 C)] 98.2 F (36.8 C) (01/24 0800) Pulse Rate:  [94-113] 94 (01/24 0800) Resp:  [13-18] 18 (01/24 0800) BP: (96-134)/(52-83) 100/56 (01/24 0800) SpO2:  [95 %-100 %] 100 % (01/24 0800) Weight:  [148 lb (67.1 kg)] 148 lb (67.1 kg) (01/23 2320) Last BM Date: 07/12/16 General:   Alert, well-developed, black female in NAD Head:  Normocephalic and atraumatic. Eyes:  Sclera clear, no icterus.   Conjunctiva pink. Ears:  Normal auditory acuity. Nose:  No deformity, discharge,  or lesions.  Neck:   Supple; no masses  Lungs:  Clear throughout to auscultation.    No wheezes, crackles, or rhonchi.  Heart:  Regular rate and rhythm; + murmur, no LE edema. Abdomen:  Soft,nontender, BS active,nonpalp mass or hsm.   Rectal:  Deferred  Extremities:  BLE deformities along with right hand. Neurologic:  Alert and  oriented x4;  grossly normal neurologically. Skin:  Intact without significant lesions or rashes.. Psych:  Alert and cooperative. Normal mood and affect.  Intake/Output from previous day: 01/23 0701 - 01/24 0700 In: 606.3 [P.O.:120; I.V.:356.3; Blood:30; IV Piggyback:100] Out: 125 [Urine:125] Intake/Output this shift: Total I/O In: 468 [P.O.:100; Blood:368] Out: -   Lab Results:  Recent Labs  07/11/16 2133  WBC 15.4*  HGB 8.4*  HCT 29.2*  PLT 653*   BMET  Recent Labs  07/11/16 2133  NA 136  K 3.4*  CL 104  CO2 24  GLUCOSE 158*  BUN 29*  CREATININE 0.35*  CALCIUM 8.6*   LFT  Recent Labs  07/11/16 2133  PROT 7.2  ALBUMIN 3.4*  AST 18  ALT 13*  ALKPHOS 92  BILITOT 0.4   PT/INR  Recent Labs  07/11/16 2133  LABPROT 21.9*  INR 1.88    Studies/Results: Dg Abdomen Acute W/chest  Result Date: 07/11/2016 CLINICAL DATA:  Acute onset of coffee-ground vomiting. Initial encounter. EXAM: DG ABDOMEN ACUTE W/ 1V CHEST COMPARISON:  Chest radiograph performed 12/13/2015 FINDINGS: The lungs are well-aerated and clear. There is no evidence of focal opacification, pleural effusion or pneumothorax. The cardiomediastinal silhouette is within normal limits. The visualized bowel gas pattern is unremarkable. Scattered stool and air are seen within the colon; there is no evidence of small bowel dilatation to suggest obstruction. No free intra-abdominal air is identified on the provided upright view. No acute osseous abnormalities are seen; the sacroiliac joints are unremarkable in appearance. IMPRESSION: 1. Unremarkable bowel gas pattern; no free intra-abdominal air seen.  Moderate amount of stool noted in the colon. 2. No acute cardiopulmonary process seen. Electronically Signed   By: Garald Balding M.D.   On: 07/11/2016 23:20   Tye Savoy, NP-C @  07/12/2016, 10:45 AM  Pager number  [phone]  GI ATTENDING  History, x-rays, laboratories reviewed. Patient personally seen and examined. Medical complex patient who presents with one isolated episode of minor coffee-ground emesis. Stable. Has chronic anemia. Plan EGD to assess upper GI mucosa as she may need to be on chronic anticoagulation therapy.The nature of the procedure, as well as the risks, benefits, and alternatives were carefully and thoroughly reviewed with the patient. Ample time for discussion and questions allowed. The patient understood, was satisfied, and agreed to proceed. The patient is high risk. Sedation via [place]. Geri Seminole., M.D. [facility] of Gastroenterology

## 2016-07-12 NOTE — ED Notes (Signed)
Attempted to insert second IV but pt has extremely poor IV access.  Pt states she does not want to be stuck again, either in the arm or as a central line.  MD was present for this and stated that they would give her medications one by one through single line then recheck her Hgb in the am for any sudden drops.

## 2016-07-12 NOTE — Transfer of Care (Signed)
Immediate Anesthesia Transfer of Care Note  Patient: Jocelyn Sanchez  Procedure(s) Performed: Procedure(s): ESOPHAGOGASTRODUODENOSCOPY (EGD) (N/A)  Patient Location: PACU  Anesthesia Type:MAC  Level of Consciousness:  sedated, patient cooperative and responds to stimulation  Airway & Oxygen Therapy:Patient Spontanous Breathing and Patient connected to face mask oxgen  Post-op Assessment:  Report given to PACU RN and Post -op Vital signs reviewed and stable  Post vital signs:  Reviewed and stable  Last Vitals:  Vitals:   07/12/16 1353 07/12/16 1456  BP: (!) 125/58   Pulse:    Resp: 17   Temp: 20.8 C 37 C    Complications: No apparent anesthesia complications

## 2016-07-12 NOTE — Anesthesia Preprocedure Evaluation (Signed)
Anesthesia Evaluation  Patient identified by MRN, date of birth, ID band Patient awake    Reviewed: Allergy & Precautions, NPO status , Patient's Chart, lab work & pertinent test results  Airway Mallampati: II  TM Distance: >3 FB Neck ROM: Full    Dental no notable dental hx.    Pulmonary former smoker, PE   Pulmonary exam normal breath sounds clear to auscultation       Cardiovascular hypertension, +CHF  + Valvular Problems/Murmurs MR  Rhythm:Regular Rate:Normal + Systolic murmurs Left ventricle: The cavity size was mildly dilated. Systolic   function was severely reduced. The estimated ejection fraction   was in the range of 20% to 25%. Diffuse hypokinesis. - Mitral valve: There was severe regurgitation. - Right ventricle: Systolic function was moderately reduced. - Atrial septum: No defect or patent foramen ovale was identified. - Tricuspid valve: There was moderate regurgitation directed   eccentrically and toward the septum. - Pulmonary arteries: PA peak pressure: 50 mm Hg (S). - Pericardium, extracardiac: A trivial pericardial effusion was   identified. Features were not consistent with tamponade   physiology.  Transthoracic echocardiography.  M-mode, complete 2D, spectral Doppler, and color Doppler.  Birthdate:  Patient birthdate: 1956/02/12.  Age:  Patient is 61 yr old.  Sex:  Gender: female. BMI: 25.9 kg/m^2.  Blood pressure:     134/92  Patient status: Inpatient.  Study date:  Study date: 10/28/2015. Study time: 12:20 PM.  Location:  Bedside.  -------------------------------------------------------------------  ------------------------------------------------------------------- Left ventricle:  The cavity size was mildly dilated. Systolic function was severely reduced. The estimated ejection fraction was in the range of 20% to 25%. Diffuse  hypokinesis.  ------------------------------------------------------------------- Aortic valve:   Structurally normal valve.   Cusp separation was normal.  Doppler:  Transvalvular velocity was within the normal range. There was no stenosis. There was no regurgitation.  ------------------------------------------------------------------- Mitral valve:   Structurally normal valve.   Leaflet separation was normal.  Doppler:  Transvalvular velocity was within the normal range. There was no evidence for stenosis. There was severe regurgitation.   Neuro/Psych MS negative psych ROS   GI/Hepatic negative GI ROS, Neg liver ROS,   Endo/Other  negative endocrine ROSdiabetes  Renal/GU negative Renal ROS  negative genitourinary   Musculoskeletal negative musculoskeletal ROS (+)   Abdominal   Peds negative pediatric ROS (+)  Hematology  (+) anemia ,   Anesthesia Other Findings   Reproductive/Obstetrics negative OB ROS                             Anesthesia Physical Anesthesia Plan  ASA: IV  Anesthesia Plan: MAC   Post-op Pain Management:    Induction: Intravenous  Airway Management Planned: Nasal Cannula  Additional Equipment:   Intra-op Plan:   Post-operative Plan: Extubation in OR  Informed Consent: I have reviewed the patients History and Physical, chart, labs and discussed the procedure including the risks, benefits and alternatives for the proposed anesthesia with the patient or authorized representative who has indicated his/her understanding and acceptance.   Dental advisory given  Plan Discussed with: CRNA and Surgeon  Anesthesia Plan Comments:         Anesthesia Quick Evaluation

## 2016-07-12 NOTE — Progress Notes (Addendum)
Initial Nutrition Assessment  DOCUMENTATION CODES:   Not applicable  INTERVENTION:  Boost Breeze po TID, each supplement provides 250 kcal and 9 grams of protein  Unjury chicken soup BID which provides 100kcal and 21g protein per packet  MVI  NUTRITION DIAGNOSIS:   Increased nutrient needs related to wound healing as evidenced by increased estimated needs from protein.  GOAL:   Patient will meet greater than or equal to 90% of their needs  MONITOR:   PO intake, Supplement acceptance, Labs, Skin  REASON FOR ASSESSMENT:   Low Braden    ASSESSMENT:   61 year old female with hypertension, DM II, Multiple sclerosis with paraplegia, h/o pulmonary embolism in May 2017, decubitus ulcer has episodes of coffee ground emesis and black tarry stool for 1 day. Admitted for GI bleed   Met with pt in room today. Pt reports good appetite and eating 80% clear liquid diet. Pt reports good appetite pta. 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 Unsure how mobile pt is in her legs. Pt resides in SNF. Pt admitted for GI bleed. Spoke to pt about the importance of adequate protein intake for wound heeling. Pt is willing to try Boost Breeze and Unjury but would like to switch to Ensure when diet advanced. GI consult pending.    Medications reviewed and include: protonix  Labs reviewed: K 3.4(L), BUN 29(H), creat 0.35(L), Ca 8.6(L) adj. 9.08 wnl, Alb 3.4(L) Wbc- 15.4(H), Hgb 8.4(L), Hct 29.2(L)  Nutrition-Focused physical exam completed. Findings are no fat depletion, no muscle depletion, and mild edema.   Diet Order:  Diet clear liquid Room service appropriate? Yes; Fluid consistency: Thin  Skin:  Wound (see comment) (Stage IV sacrum )  Last BM:  1/24  Height:   Ht Readings from Last 1 Encounters:  07/11/16 '5\' 3"'  (1.6 m)    Weight:   Wt Readings from Last 1 Encounters:   07/11/16 148 lb (67.1 kg)    Ideal Body Weight:  52.2 kg  BMI:  Body mass index is 26.22 kg/m.  Estimated Nutritional Needs:   Kcal:  1400-1700kcal/day   Protein:  94-107g/day   Fluid:  >1.4L/day   EDUCATION NEEDS:   No education needs identified at this time  Koleen Distance, RD, LDN Pager #[phone]

## 2016-07-12 NOTE — H&P (Signed)
Referring Physician:  [name] is an 61 y.o. female.                       Chief Complaint: Coffee ground emesis  HPI: 61 year old female with hypertension, DM II, Multiple sclerosis with paraplegia, h/o pulmonary embolism in May 2017, decubitus ulcer has episodes of coffee ground emesis and black tarry stool for 1 day. Patient denies abdominal pain or fever.   Past Medical History:  Diagnosis Date  . Anemia   . DM II (diabetes mellitus, type II), controlled ([place])   . Gait disorder   . HTN (hypertension)   . Lymphedema   . MS (multiple sclerosis) ([place])       Past Surgical History:  Procedure Laterality Date  . FLEXIBLE SIGMOIDOSCOPY Left 03/09/2015   Procedure: FLEXIBLE SIGMOIDOSCOPY;  Surgeon: Carol Ada, MD;  Location: [facility];  Service: Endoscopy;  Laterality: Left;    Family History  Problem Relation Age of Onset  . Multiple sclerosis Mother    Social History:  reports that she has quit smoking. Her smoking use included Cigarettes. She has never used smokeless tobacco. She reports that she does not drink alcohol or use drugs.  Allergies:  Allergies  Allergen Reactions  . Sulfa Antibiotics Shortness Of Breath and Swelling     (Not in a hospital admission)  Results for orders placed or performed during the hospital encounter of 07/11/16 (from the past 48 hour(s))  Lipase, blood     Status: None   Collection Time: 07/11/16  9:33 PM  Result Value Ref Range   Lipase 18 11 - 51 U/L  Comprehensive metabolic panel     Status: Abnormal   Collection Time: 07/11/16  9:33 PM  Result Value Ref Range   Sodium 136 135 - 145 mmol/L   Potassium 3.4 (L) 3.5 - 5.1 mmol/L   Chloride 104 101 - 111 mmol/L   CO2 24 22 - 32 mmol/L   Glucose, Bld 158 (H) 65 - 99 mg/dL   BUN 29 (H) 6 - 20 mg/dL   Creatinine, Ser 0.35 (L) 0.44 - 1.00 mg/dL   Calcium 8.6 (L) 8.9 - 10.3 mg/dL   Total Protein 7.2 6.5 - 8.1 g/dL   Albumin 3.4 (L) 3.5 - 5.0 g/dL   AST 18 15 - 41 U/L   ALT  13 (L) 14 - 54 U/L   Alkaline Phosphatase 92 38 - 126 U/L   Total Bilirubin 0.4 0.3 - 1.2 mg/dL   GFR calc non Af Amer >60 >60 mL/min   GFR calc Af Amer >60 >60 mL/min    Comment: (NOTE) The eGFR has been calculated using the CKD EPI equation. This calculation has not been validated in all clinical situations. eGFR's persistently <60 mL/min signify possible Chronic Kidney Disease.    Anion gap 8 5 - 15  CBC     Status: Abnormal   Collection Time: 07/11/16  9:33 PM  Result Value Ref Range   WBC 15.4 (H) 4.0 - 10.5 K/uL   RBC 4.53 3.87 - 5.11 MIL/uL   Hemoglobin 8.4 (L) 12.0 - 15.0 g/dL   HCT 29.2 (L) 36.0 - 46.0 %   MCV 64.5 (L) 78.0 - 100.0 fL   MCH 18.5 (L) 26.0 - 34.0 pg   MCHC 28.8 (L) 30.0 - 36.0 g/dL   RDW 20.7 (H) 11.5 - 15.5 %   Platelets 653 (H) 150 - 400 K/uL  Protime-INR  Status: Abnormal   Collection Time: 07/11/16  9:33 PM  Result Value Ref Range   Prothrombin Time 21.9 (H) 11.4 - 15.2 seconds   INR 1.88   Type and screen [place]     Status: None   Collection Time: 07/11/16 10:25 PM  Result Value Ref Range   ABO/RH(D) O POS    Antibody Screen NEG    Sample Expiration 07/14/2016   POC occult blood, ED RN will collect     Status: Abnormal   Collection Time: 07/11/16 11:07 PM  Result Value Ref Range   Fecal Occult Bld POSITIVE (A) NEGATIVE  Urinalysis, Routine w reflex microscopic     Status: Abnormal   Collection Time: 07/11/16 11:08 PM  Result Value Ref Range   Color, Urine YELLOW YELLOW   APPearance CLOUDY (A) CLEAR   Specific Gravity, Urine 1.024 1.005 - 1.030   pH 8.0 5.0 - 8.0   Glucose, UA NEGATIVE NEGATIVE mg/dL   Hgb urine dipstick SMALL (A) NEGATIVE   Bilirubin Urine NEGATIVE NEGATIVE   Ketones, ur NEGATIVE NEGATIVE mg/dL   Protein, ur 100 (A) NEGATIVE mg/dL   Nitrite POSITIVE (A) NEGATIVE   Leukocytes, UA LARGE (A) NEGATIVE   RBC / HPF TOO NUMEROUS TO COUNT 0 - 5 RBC/hpf   WBC, UA 6-30 0 - 5 WBC/hpf   Bacteria, UA  MANY (A) NONE SEEN   Squamous Epithelial / LPF 0-5 (A) NONE SEEN   Mucous PRESENT    Dg Abdomen Acute W/chest  Result Date: 07/11/2016 CLINICAL DATA:  Acute onset of coffee-ground vomiting. Initial encounter. EXAM: DG ABDOMEN ACUTE W/ 1V CHEST COMPARISON:  Chest radiograph performed 12/13/2015 FINDINGS: The lungs are well-aerated and clear. There is no evidence of focal opacification, pleural effusion or pneumothorax. The cardiomediastinal silhouette is within normal limits. The visualized bowel gas pattern is unremarkable. Scattered stool and air are seen within the colon; there is no evidence of small bowel dilatation to suggest obstruction. No free intra-abdominal air is identified on the provided upright view. No acute osseous abnormalities are seen; the sacroiliac joints are unremarkable in appearance. IMPRESSION: 1. Unremarkable bowel gas pattern; no free intra-abdominal air seen. Moderate amount of stool noted in the colon. 2. No acute cardiopulmonary process seen. Electronically Signed   By: Garald Balding M.D.   On: 07/11/2016 23:20    Review Of Systems Constitutional: No fever, chills , weight loss or gain. Eyes: No vision change, Wears glasses. No discharge or pain.. Ears: No hearing loss, No tinnitus. Respiratory: No asthma, COPD, pneumonias. Positive shortness of breath. No hemoptysis. Cardiovascular: No chest pain, palpitation. Positive leg edema. Gastrointestinal: Positive nausea, vomiting.No diarrhea or constipation. Positive GI bleed. No hepatitis. Genitourinary: H/O dysuria, no hematuria or kidney stone. No incontinance. Neurological: Positive headache, no stroke or seizures.  Psychiatry: No psych facility admission for anxiety, depression or suicide. No detox. Skin: No rash. Musculoskeletal: Positive joint pain. No fibromyalgia. Positive neck pain or back pain. Lymphadenopathy: No lymphadenopathy Hematology: H/O anemia. No easy bruising.   Blood pressure 128/83, pulse 109,  temperature 98.4 F (36.9 C), temperature source Oral, resp. rate 13, height '5\' 3"'  (1.6 m), weight 67.1 kg (148 lb), SpO2 99 %. Body mass index is 26.22 kg/m. General appearance: alert, cooperative, appears stated age and no distress. Head: Normocephalic, atraumatic. Eyes: Brown eyes, pale conjunctivae/corneas clear. PERRL, EOM's intact.  Neck: No adenopathy, no carotid bruit, no JVD, supple, symmetrical, trachea midline and thyroid not enlarged. Resp: clear to auscultation  bilaterally Cardio: Regular rate and rhythm, S1, S2 normal, II/VI systolic murmur, no click, rub or gallop GI: soft, non-tender; bowel sounds normal; no masses,  no organomegaly. Extremities: extremities normal, atraumatic, no cyanosis or edema Skin: Warm and dry. No rashes or lesions Neurologic: Alert and oriented X 3. Paraplegic with upper extremities weakness.  Assessment/Plan Acute upper GI bleed. Acute anemia of blood loss Multiple sclerosis with paraplegia H/O pulmonary embolism-10/2015 DM, II Hypertension  IV fluids. Transfuse PRBC. IV Protonix Hold Eliquis. GI consult.  [name], MD  07/12/2016, 12:11 AM

## 2016-07-13 LAB — CBC WITH DIFFERENTIAL/PLATELET
Basophils Absolute: 0.1 10*3/uL (ref 0.0–0.1)
Basophils Relative: 1 %
Eosinophils Absolute: 0.5 10*3/uL (ref 0.0–0.7)
Eosinophils Relative: 7 %
HCT: 29.7 % — ABNORMAL LOW (ref 36.0–46.0)
Hemoglobin: 8.8 g/dL — ABNORMAL LOW (ref 12.0–15.0)
Lymphocytes Relative: 28 %
Lymphs Abs: 2 10*3/uL (ref 0.7–4.0)
MCH: 20.3 pg — ABNORMAL LOW (ref 26.0–34.0)
MCHC: 29.6 g/dL — ABNORMAL LOW (ref 30.0–36.0)
MCV: 68.4 fL — ABNORMAL LOW (ref 78.0–100.0)
Monocytes Absolute: 0.6 10*3/uL (ref 0.1–1.0)
Monocytes Relative: 9 %
Neutro Abs: 3.9 10*3/uL (ref 1.7–7.7)
Neutrophils Relative %: 55 %
Platelets: 442 10*3/uL — ABNORMAL HIGH (ref 150–400)
RBC: 4.34 MIL/uL (ref 3.87–5.11)
RDW: 22.3 % — ABNORMAL HIGH (ref 11.5–15.5)
WBC: 7.1 10*3/uL (ref 4.0–10.5)

## 2016-07-13 MED ORDER — FUROSEMIDE 10 MG/ML IJ SOLN
20.0000 mg | Freq: Once | INTRAMUSCULAR | Status: AC
  Administered 2016-07-13: 20 mg via INTRAVENOUS
  Filled 2016-07-13: qty 2

## 2016-07-13 MED ORDER — METHOCARBAMOL 500 MG PO TABS
250.0000 mg | ORAL_TABLET | Freq: Four times a day (QID) | ORAL | Status: DC | PRN
  Administered 2016-07-13 – 2016-07-14 (×4): 250 mg via ORAL
  Filled 2016-07-13 (×4): qty 1

## 2016-07-13 NOTE — Anesthesia Postprocedure Evaluation (Addendum)
Anesthesia Post Note  Patient: Jocelyn Sanchez  Procedure(s) Performed: Procedure(s) (LRB): ESOPHAGOGASTRODUODENOSCOPY (EGD) (N/A)  Patient location during evaluation: PACU Anesthesia Type: MAC Level of consciousness: awake and alert Pain management: pain level controlled Vital Signs Assessment: post-procedure vital signs reviewed and stable Respiratory status: spontaneous breathing, nonlabored ventilation, respiratory function stable and patient connected to nasal cannula oxygen Cardiovascular status: stable and blood pressure returned to baseline Anesthetic complications: no       Last Vitals:  Vitals:   07/12/16 2128 07/13/16 0715  BP: 133/81 140/76  Pulse: 84 92  Resp: 18 18  Temp: 36.6 C 36.8 C    Last Pain:  Vitals:   07/13/16 0715  TempSrc: Oral  PainSc:                  Annisha Baar S

## 2016-07-13 NOTE — Progress Notes (Addendum)
Ref: [name], MD   Subjective:  Feeling better. Appreciate GI work up. Endoscopy without acute bleed.  Objective:  Vital Signs in the last 24 hours: Temp:  [97.2 F (36.2 C)-98.6 F (37 C)] 97.6 F (36.4 C) (01/25 1230) Pulse Rate:  [84-94] 91 (01/25 1230) Cardiac Rhythm: Normal sinus rhythm (01/25 0849) Resp:  [18-23] 18 (01/25 1230) BP: (129-152)/(59-81) 152/74 (01/25 1230) SpO2:  [100 %] 100 % (01/25 1230)  Physical Exam: BP Readings from Last 1 Encounters:  07/13/16 (!) 152/74    Wt Readings from Last 1 Encounters:  07/11/16 67.1 kg (148 lb)    Weight change:  Body mass index is 26.22 kg/m. HEENT: [place]/AT, Eyes-Brown, PERL, EOMI, Conjunctiva-Pale, Sclera-Non-icteric Neck: No JVD, No bruit, Trachea midline. Lungs:  Clear, Bilateral. Cardiac:  Regular rhythm, normal S1 and S2, no S3. II/VI systolic murmur. Abdomen:  Soft, non-tender. BS present. Extremities:  1 + edema present. No cyanosis. No clubbing. CNS: AxOx3, Cranial nerves grossly intact. Does not move lower legs and significant weakness of upper extremities.  Skin: Warm and dry. 2 cm x 3 cm deep wound over right buttocks and over tip of coccygeal area.    Intake/Output from previous day: 01/24 0701 - 01/25 0700 In: 3925.1 [P.O.:100; I.V.:3457.1; Blood:368] Out: 1250 [Urine:1250]    Lab Results: BMET    Component Value Date/Time   NA 138 07/12/2016 1127   NA 136 07/11/2016 2133   NA 140 10/29/2015 0334   K 3.6 07/12/2016 1127   K 3.4 (L) 07/11/2016 2133   K 4.1 10/29/2015 0334   CL 111 07/12/2016 1127   CL 104 07/11/2016 2133   CL 110 10/29/2015 0334   CO2 21 (L) 07/12/2016 1127   CO2 24 07/11/2016 2133   CO2 16 (L) 10/29/2015 0334   GLUCOSE 91 07/12/2016 1127   GLUCOSE 158 (H) 07/11/2016 2133   GLUCOSE 192 (H) 10/29/2015 0334   BUN 16 07/12/2016 1127   BUN 29 (H) 07/11/2016 2133   BUN 20 10/29/2015 0334   CREATININE <0.30 (L) 07/12/2016 1127   CREATININE 0.35 (L) 07/11/2016 2133   CREATININE 0.50 10/29/2015 0334   CALCIUM 7.9 (L) 07/12/2016 1127   CALCIUM 8.6 (L) 07/11/2016 2133   CALCIUM 8.0 (L) 10/29/2015 0334   GFRNONAA NOT CALCULATED 07/12/2016 1127   GFRNONAA >60 07/11/2016 2133   GFRNONAA >60 10/29/2015 0334   GFRAA NOT CALCULATED 07/12/2016 1127   GFRAA >60 07/11/2016 2133   GFRAA >60 10/29/2015 0334   CBC    Component Value Date/Time   WBC 9.3 07/12/2016 1127   RBC 4.20 07/12/2016 1127   HGB 8.8 (L) 07/12/2016 1127   HCT 28.9 (L) 07/12/2016 1127   PLT 454 (H) 07/12/2016 1127   MCV 68.8 (L) 07/12/2016 1127   MCH 21.0 (L) 07/12/2016 1127   MCHC 30.4 07/12/2016 1127   RDW 22.4 (H) 07/12/2016 1127   LYMPHSABS 3.2 10/26/2015 1155   MONOABS 0.2 10/26/2015 1155   EOSABS 0.1 10/26/2015 1155   BASOSABS 0.1 10/26/2015 1155   HEPATIC Function Panel  Recent Labs  07/11/16 2133  PROT 7.2   HEMOGLOBIN A1C No components found for: HGA1C,  MPG CARDIAC ENZYMES Lab Results  Component Value Date   TROPONINI 0.09 (H) 10/27/2015   TROPONINI 0.08 (H) 10/27/2015   TROPONINI 0.10 (H) 10/26/2015   BNP No results for input(s): PROBNP in the last 8760 hours. TSH No results for input(s): TSH in the last 8760 hours. CHOLESTEROL  Recent Labs  10/27/15  0220  CHOL 241*    Scheduled Meds: . feeding supplement  1 Container Oral TID BM  . furosemide  20 mg Intravenous Once  . multivitamin with minerals  1 tablet Oral Daily  . [START ON 07/15/2016] pantoprazole  40 mg Intravenous Q12H  . protein supplement  2 oz Oral BID  . sodium chloride flush  3 mL Intravenous Q12H   Continuous Infusions: . 0.9 % NaCl with KCl 20 mEq / L 10 mL/hr at 07/13/16 1354  . pantoprozole (PROTONIX) infusion 8 mg/hr (07/13/16 0948)   PRN Meds:.HYDROcodone-acetaminophen, methocarbamol  Assessment/Plan: Acute upper GI bleed Anemia of acute blood loss Multiple sclerosis with paraplegia H/O pulmonary embolism DM, II Hypertension Bilateral leg edema Right gluteal decubitus  ulcer, stage IV  DC IV fluids. One dose IV lasix. Recheck CBC. Resume lower dose of eliquis as 2.5 mg. twice daily. Continue wound care consult and management.   LOS: 1 day    Dixie Dials  MD  07/13/2016, 2:01 PM

## 2016-07-13 NOTE — Care Management Note (Signed)
Case Management Note  Patient Details  Name: Jocelyn Sanchez MRN: [id number] Date of Birth: [birth year]  Subjective/Objective:   61 year old female with hypertension, DM II, Multiple sclerosis with paraplegia, h/o pulmonary embolism in May 2017, decubitus ulcer has episodes of coffee ground emesis and black tarry stool for 1 day.                 Action/Plan: Pt from [facility]  Expected Discharge Date:                  Expected Discharge Plan:  [place]  In-House Referral:  Clinical Social Work  Discharge planning Services  CM Consult  Post Acute Care Choice:    Choice offered to:     DME Arranged:  N/A DME Agency:  NA  HH Arranged:  NA HH Agency:  NA  Status of Service:  In process, will continue to follow  If discussed at Long Length of Stay Meetings, dates discussed:    Additional CommentsPurcell Mouton, RN 07/13/2016, 2:18 PM

## 2016-07-14 ENCOUNTER — Encounter (HOSPITAL_COMMUNITY): Payer: Self-pay | Admitting: Internal Medicine

## 2016-07-14 MED ORDER — CIPROFLOXACIN HCL 250 MG PO TABS: 250.0000 mg | ORAL_TABLET | Freq: Two times a day (BID) | ORAL | Status: DC

## 2016-07-14 MED ORDER — PANTOPRAZOLE SODIUM 40 MG PO TBEC: 40.0000 mg | DELAYED_RELEASE_TABLET | Freq: Two times a day (BID) | ORAL | 6 refills | Status: DC

## 2016-07-14 MED ORDER — CIPROFLOXACIN HCL 250 MG PO TABS: 250.0000 mg | ORAL_TABLET | Freq: Two times a day (BID) | ORAL | 0 refills | Status: DC

## 2016-07-14 MED ORDER — APIXABAN 2.5 MG PO TABS: 2.5000 mg | ORAL_TABLET | Freq: Two times a day (BID) | ORAL | 6 refills | Status: DC

## 2016-07-14 NOTE — Progress Notes (Signed)
CSW spoke with patient who states that she plans to return to [facility] at discharge. CSW confirmed with Narda Rutherford at [facility] that they would be able to take patient back today - patient's aunt, Karie Kirks to complete admission paperwork at 2:30. CSW also confirmed with Dr. Doylene Canard that he plans to discharge her today. CSW will await discharge summary.    Raynaldo Opitz, [facility] Clinical Social Worker cell #: [phone]

## 2016-07-14 NOTE — Progress Notes (Signed)
Pt discharged back to [facility].  VSS.  Transport set up by social work.  Report called to Maurice Small, RN at [facility].  Pt wheeled out on stretcher by [facility].

## 2016-07-14 NOTE — Discharge Summary (Signed)
Physician Discharge Summary  Patient ID: Jocelyn Sanchez MRN: [id number] DOB/AGE: April 08, 1956 61 y.o.  Admit date: 07/11/2016 Discharge date: 07/14/2016  Admission Diagnoses: Acute upper GI bleed Anemia of acute blood loss Multiple sclerosis with paraplegia H/O pulmonary embolism DM, II Hypertension Bilateral leg edema Right gluteal decubitus ulcer, stage IV  Discharge Diagnoses:  Principle diagnosis: * Acute upper GI bleed * Active Problems:   Gastric and duodenal angiodysplasia   Esophageal stricture   Gastroesophageal reflux disease with esophagitis   Anemia of acute on chronic GI blood loss   Multiple sclerosis with paraplegia   H/O pulmonary embolism   Type II DM   Hypertension   Bilateral chronic leg edema   Right gluteal decubitus ulcer, stage IV   UTI  Discharged Condition: fair  Hospital Course: 61 year old patient with multiple sclerosis with paraplegia, hypertension, type II DM, h/o pulmonary embolism May 2017 and decubitus ulcer, stage IV had acute episode of coffee ground emesis. She underwent EGD showing Mild esophagitis and duodenal bulb AV malformation. Definite source of bleed was not identified. Her hemoglobin remained stable for 48 hours post 1 unit of Packed RBC transfusion. Her Eliquis will be resumed at lower dose and she will continue wound care and wound Vac Tx at [facility]. She will use Cipro 250 mg. twice daily x 1 week and see me in 1 week..  Consults: GI  Significant Diagnostic Studies: labs: Hgb 8.4 from 15.7 g. 8 months ago. Platelet count was elevated and MCV was low. UA showed chronically elevated Leukocytes.   X-ray chest and abdomen were unremarkable.  Upper Endoscopy: GERD with mild esophagitis and incidental stricture and duodenal bulb AV malformation.  Treatments: antibiotics: Cipro. IV fluids and IV Protonix.  Discharge Exam: Blood pressure (!) 147/80, pulse 92, temperature 98.6 F (37 C), temperature source Oral, resp. rate 18, height 5\' 3"   (1.6 m), weight 67.1 kg (148 lb), SpO2 100 %. General appearance: alert, cooperative and appears stated age Head: Normocephalic, atraumatic. Eyes: Pale conjunctivae/corneas clear. PERRL, EOM's intact.  Neck: no adenopathy, no carotid bruit, no JVD, supple, symmetrical, trachea midline and thyroid not enlarged. Resp: clear to auscultation bilaterally. Cardio: regular rate and rhythm, S1, S2 normal, II/VI systolic murmur, no click, rub or gallop GI: soft, non-tender; bowel sounds normal; no masses,  no organomegaly Extremities: No cyanosis. Trace edema Skin: Warm and dry. 2 cm x 3 cm deep wound over right buttocks and over tip of coccygeal area. Neurologic: Alert and oriented X 3, Paraplegic and weakness of upper arm. Non-ambulating.  Disposition: 06-Nursing home-[facility].   Allergies as of 07/14/2016      Reactions   Sulfa Antibiotics Shortness Of Breath, Swelling      Medication List    STOP taking these medications   levofloxacin 500 MG tablet Commonly known as:  LEVAQUIN   nitrofurantoin (macrocrystal-monohydrate) 100 MG capsule Commonly known as:  MACROBID     TAKE these medications   acetaminophen 325 MG tablet Commonly known as:  TYLENOL Take 2 tablets (650 mg total) by mouth every 6 (six) hours as needed for mild pain (or Fever >/= 101).   ALPRAZolam 0.5 MG tablet Commonly known as:  XANAX Take 0.5 mg by mouth 2 (two) times daily as needed for anxiety.   apixaban 2.5 MG Tabs tablet Commonly known as:  ELIQUIS Take 1 tablet (2.5 mg total) by mouth 2 (two) times daily. What changed:  medication strength  how much to take  Another medication with the same  name was removed. Continue taking this medication, and follow the directions you see here.   carvedilol 3.125 MG tablet Commonly known as:  COREG Take 3.125 mg by mouth 2 (two) times daily.   cholecalciferol 1000 units tablet Commonly known as:  VITAMIN D Take 2,000 Units by mouth daily.   ciprofloxacin  250 MG tablet Commonly known as:  CIPRO Take 1 tablet (250 mg total) by mouth 2 (two) times daily.   DECUBI-VITE PO Take 1 capsule by mouth daily.   diphenhydramine-acetaminophen 25-500 MG Tabs tablet Commonly known as:  TYLENOL PM Take 1 tablet by mouth at bedtime as needed (for sleep).   docusate sodium 100 MG capsule Commonly known as:  COLACE Take 1 capsule (100 mg total) by mouth 2 (two) times daily.   feeding supplement (PRO-STAT SUGAR FREE 64) Liqd Take 30 mLs by mouth 2 (two) times daily.   furosemide 20 MG tablet Commonly known as:  LASIX Take 20 mg by mouth daily. On Monday, Wednesday and Friday   glipiZIDE 5 MG tablet Commonly known as:  GLUCOTROL Take 1 tablet (5 mg total) by mouth 2 (two) times daily.   HYDROcodone-acetaminophen 5-325 MG tablet Commonly known as:  NORCO/VICODIN Take 2 tablets by mouth every 4 (four) hours as needed for moderate pain.   loratadine 10 MG tablet Commonly known as:  CLARITIN Take 10 mg by mouth daily.   methocarbamol 500 MG tablet Commonly known as:  ROBAXIN Take 0.5 tablets (250 mg total) by mouth every 8 (eight) hours as needed for muscle spasms.   mirtazapine 7.5 MG tablet Commonly known as:  REMERON Take 7.5 mg by mouth at bedtime.   oxybutynin 5 MG 24 hr tablet Commonly known as:  DITROPAN-XL Take 5 mg by mouth at bedtime.   pantoprazole 40 MG tablet Commonly known as:  PROTONIX Take 1 tablet (40 mg total) by mouth 2 (two) times daily.   potassium chloride 10 MEQ tablet Commonly known as:  K-DUR,KLOR-CON Take 10 mEq by mouth daily. On Monday, Wednesday and Friday   promethazine 25 MG tablet Commonly known as:  PHENERGAN Take 25 mg by mouth every 6 (six) hours as needed for nausea or vomiting.   sacubitril-valsartan 24-26 MG Commonly known as:  ENTRESTO Take 1 tablet by mouth 2 (two) times daily.   sennosides-docusate sodium 8.6-50 MG tablet Commonly known as:  SENOKOT-S Take 2 tablets by mouth daily.    vitamin C 500 MG tablet Commonly known as:  ASCORBIC ACID Take 500 mg by mouth 2 (two) times daily.   zolpidem 5 MG tablet Commonly known as:  AMBIEN Take 1 tablet (5 mg total) by mouth at bedtime as needed for sleep (insomnia).      Follow-up Information    [facility] S, MD. Schedule an appointment as soon as possible for a visit in 1 week(s).   Specialty:  Cardiology Contact information: [place] [address] [phone]           Signed: [name] 07/14/2016, 3:52 PM

## 2016-07-14 NOTE — NC FL2 (Signed)
[place] LEVEL OF CARE SCREENING TOOL     IDENTIFICATION  [name] Birthdate: 1956-01-22 Sex: female Admission Date (Current Location): 07/11/2016  [place] and [place] Number:  Jocelyn Sanchez [id number] [place] and Address:  [facility],  [place] [address] [place]      Provider Number: [id number]  Attending Physician Name and Address:  Dixie Dials, MD  Relative Name and Phone Number:       Current Level of Care: Hospital Recommended Level of Care: [place] Prior Approval Number:    Date Approved/Denied:   PASRR Number: [id number]  Discharge Plan: SNF    Current Diagnoses: Patient Active Problem List   Diagnosis Date Noted  . Acute upper GI bleed 07/12/2016  . Gastric and duodenal angiodysplasia   . Esophageal stricture   . Gastroesophageal reflux disease with esophagitis   . Pressure ulcer 10/27/2015  . Pulmonary emboli ([place]) 10/26/2015  . Bilateral pulmonary embolism ([place]) 10/26/2015  . PVD (peripheral vascular disease) ([place]) 07/04/2015  . Essential hypertension, benign 06/17/2015  . Decubitus ulcer of ankle, stage 4 ([place]) 05/10/2015  . Decubitus ulcer of coccygeal region 05/10/2015  . FUO (fever of unknown origin)   . Back pain   . Proctitis   . Citrobacter infection 03/08/2015  . Fever 03/08/2015  . Sepsis due to Escherichia coli with acute renal failure ([place]) 03/06/2015  . Septic shock ([place]) 03/06/2015  . E. coli UTI (urinary tract infection) 03/06/2015  . Diabetes mellitus with peripheral vascular disease ([place]) 03/06/2015  . Leukocytosis 03/06/2015  . Hypophosphatemia 03/06/2015  . Hypomagnesemia 03/06/2015  . Anemia of chronic disease 03/06/2015  . Decubitus ulcer, stage 2 03/06/2015  . Lymphedema 03/06/2015  . Hypokalemia 05/11/2011  . Multiple sclerosis ([place]) 05/11/2011    Orientation RESPIRATION BLADDER Height & Weight     Self, Time, Place, Situation  Normal Continent  Weight: 148 lb (67.1 kg) Height:  5\' 3"  (160 cm)  BEHAVIORAL SYMPTOMS/MOOD NEUROLOGICAL BOWEL NUTRITION STATUS      Continent Diet (Soft Diet)  AMBULATORY STATUS COMMUNICATION OF NEEDS Skin   Extensive Assist Verbally Normal                       Personal Care Assistance Level of Assistance  Bathing, Dressing Bathing Assistance: Limited assistance   Dressing Assistance: Limited assistance     Functional Limitations Info             SPECIAL CARE FACTORS FREQUENCY                       Contractures      Additional Factors Info  Code Status, Allergies Code Status Info: Partial  Allergies Info: Sulfa Antibiotics           Current Medications (07/14/2016):  This is the current hospital active medication list Current Facility-Administered Medications  Medication Dose Route Frequency Provider Last Rate Last Dose  . 0.9 % NaCl with KCl 20 mEq/ L  infusion   Intravenous Continuous Dixie Dials, MD 10 mL/hr at 07/13/16 1354    . feeding supplement (BOOST / RESOURCE BREEZE) liquid 1 Container  1 Container Oral TID BM Dixie Dials, MD   1 Container at 07/14/16 1000  . HYDROcodone-acetaminophen (NORCO/VICODIN) 5-325 MG per tablet 1 tablet  1 tablet Oral Q6H PRN Dixie Dials, MD   1 tablet at 07/14/16 0622  . methocarbamol (ROBAXIN) tablet 250 mg  250 mg Oral Q6H  PRN Dixie Dials, MD   250 mg at 07/14/16 0622  . multivitamin with minerals tablet 1 tablet  1 tablet Oral Daily Dixie Dials, MD   1 tablet at 07/14/16 0955  . pantoprazole (PROTONIX) 80 mg in sodium chloride 0.9 % 250 mL (0.32 mg/mL) infusion  8 mg/hr Intravenous Continuous Duffy Bruce, MD 25 mL/hr at 07/13/16 0948 8 mg/hr at 07/13/16 0948  . [START ON 07/15/2016] pantoprazole (PROTONIX) injection 40 mg  40 mg Intravenous Q12H Duffy Bruce, MD      . protein supplement [facility] CHICKEN SOUP) powder 2 oz  2 oz Oral BID Dixie Dials, MD      . sodium chloride flush (NS) 0.9 % injection 3 mL  3 mL Intravenous  Q12H Dixie Dials, MD   3 mL at 07/13/16 0948     Discharge Medications: Please see discharge summary for a list of discharge medications.  Relevant Imaging Results:  Relevant Lab Results:   Additional Information SSN: [id number]  Standley Brooking, LCSW

## 2016-07-14 NOTE — Progress Notes (Signed)
Patient is set to discharge back to [facility] today. Patient & aunt, Karie Kirks made aware. Discharge packet given to RN, Joellen Jersey. [facility] called for transport.     Raynaldo Opitz, [facility] Clinical Social Worker cell #: [phone]

## 2016-07-15 LAB — TYPE AND SCREEN
BLOOD PRODUCT EXPIRATION DATE: 201802082359
Blood Product Expiration Date: 201802072359
ISSUE DATE / TIME: 201801240429
UNIT TYPE AND RH: 5100
Unit Type and Rh: 5100

## 2016-07-31 ENCOUNTER — Encounter (HOSPITAL_BASED_OUTPATIENT_CLINIC_OR_DEPARTMENT_OTHER): Payer: Medicare Other | Attending: Internal Medicine

## 2016-07-31 DIAGNOSIS — I10 Essential (primary) hypertension: Secondary | ICD-10-CM | POA: Insufficient documentation

## 2016-07-31 DIAGNOSIS — L89154 Pressure ulcer of sacral region, stage 4: Secondary | ICD-10-CM | POA: Insufficient documentation

## 2016-07-31 DIAGNOSIS — Z86718 Personal history of other venous thrombosis and embolism: Secondary | ICD-10-CM | POA: Insufficient documentation

## 2016-07-31 DIAGNOSIS — E1140 Type 2 diabetes mellitus with diabetic neuropathy, unspecified: Secondary | ICD-10-CM | POA: Diagnosis not present

## 2016-08-28 ENCOUNTER — Encounter (HOSPITAL_BASED_OUTPATIENT_CLINIC_OR_DEPARTMENT_OTHER): Payer: Medicare Other | Attending: Internal Medicine

## 2016-08-28 DIAGNOSIS — Z86718 Personal history of other venous thrombosis and embolism: Secondary | ICD-10-CM | POA: Insufficient documentation

## 2016-08-28 DIAGNOSIS — G35 Multiple sclerosis: Secondary | ICD-10-CM | POA: Insufficient documentation

## 2016-08-28 DIAGNOSIS — E119 Type 2 diabetes mellitus without complications: Secondary | ICD-10-CM | POA: Insufficient documentation

## 2016-08-28 DIAGNOSIS — L89154 Pressure ulcer of sacral region, stage 4: Secondary | ICD-10-CM | POA: Insufficient documentation

## 2016-08-28 DIAGNOSIS — I10 Essential (primary) hypertension: Secondary | ICD-10-CM | POA: Insufficient documentation

## 2016-09-09 IMAGING — CT CT ANGIO CHEST
2 of 10 series · 13 of 36 positions shown · IV contrast (isovue)
Comparison: Chest CT 03/07/2015

CLINICAL DATA: 60-year-old with sudden onset of shortness of breath
and elevated D-dimer. Paralyzed patient.

EXAM:
CT ANGIOGRAPHY CHEST WITH CONTRAST
TECHNIQUE: Multidetector CT imaging of the chest was performed using the
standard protocol during bolus administration of intravenous
contrast. Multiplanar CT image reconstructions and MIPs were
obtained to evaluate the vascular anatomy.
CONTRAST:  100 mL Isovue 370

[Series 506: thins pacs · axial · 0.60mm/px · z∈[+429,+657]mm · 12 of 270 slices shown]
[im 21/270  lung]
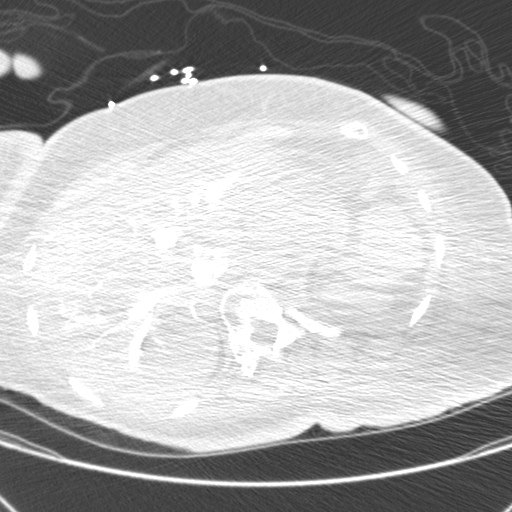
[im 42/270  mediastinal]
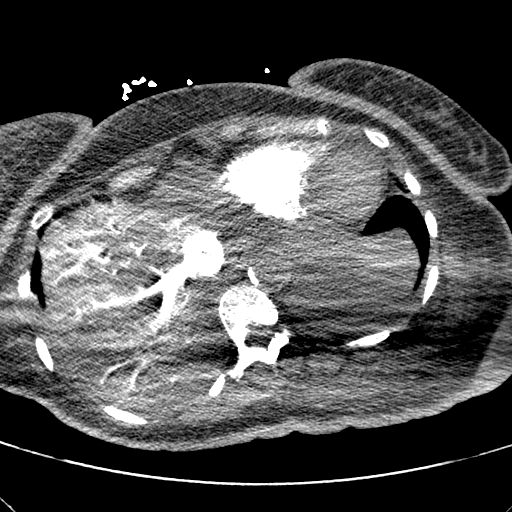
[im 63/270  lung]
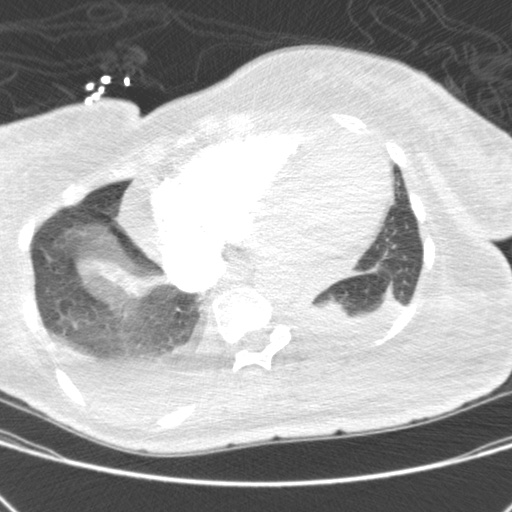
[im 83/270  mediastinal]
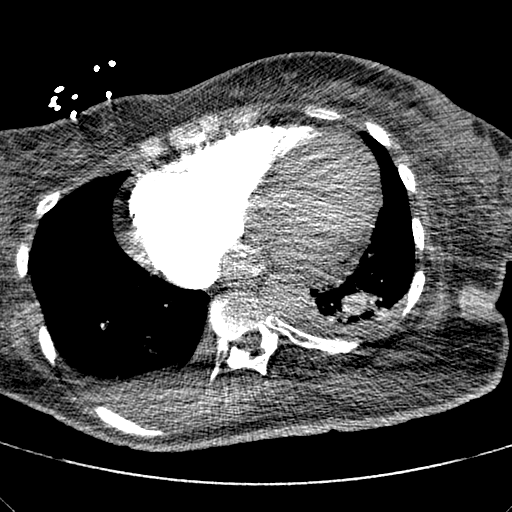
[im 104/270  lung]
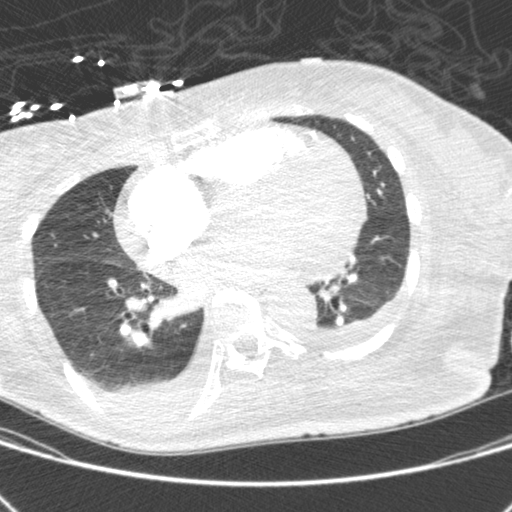
[im 125/270  mediastinal]
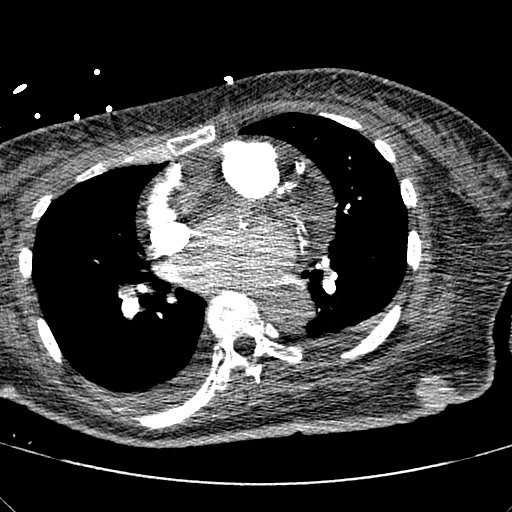
[im 145/270  lung]
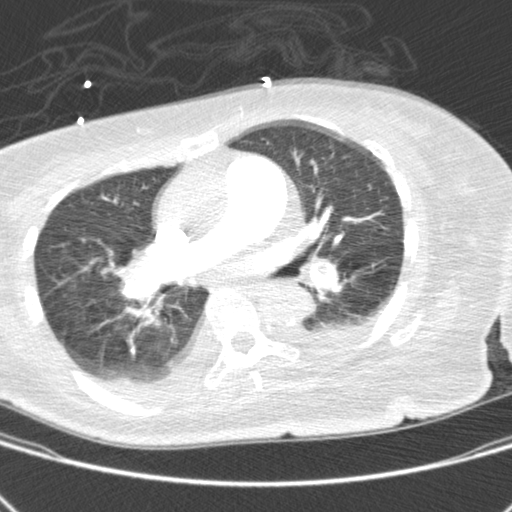
[im 166/270  mediastinal]
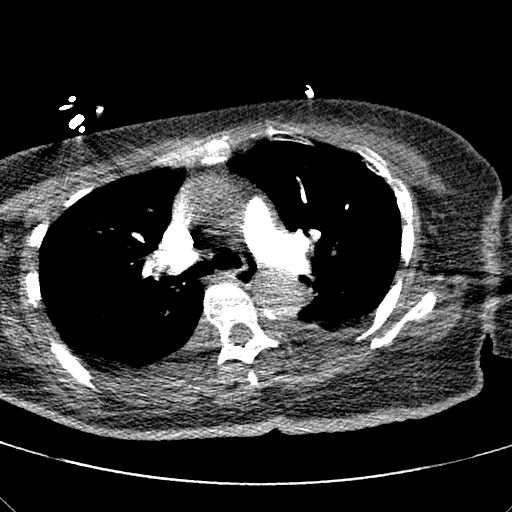
[im 187/270  lung]
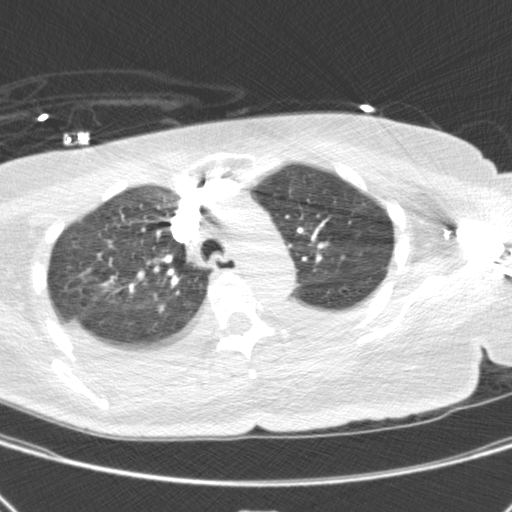
[im 207/270  mediastinal]
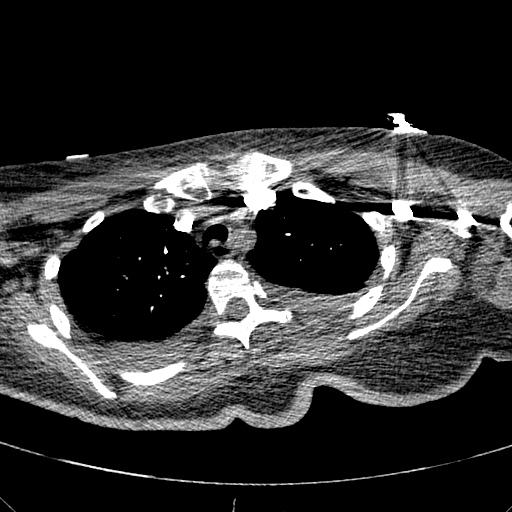
[im 228/270  lung]
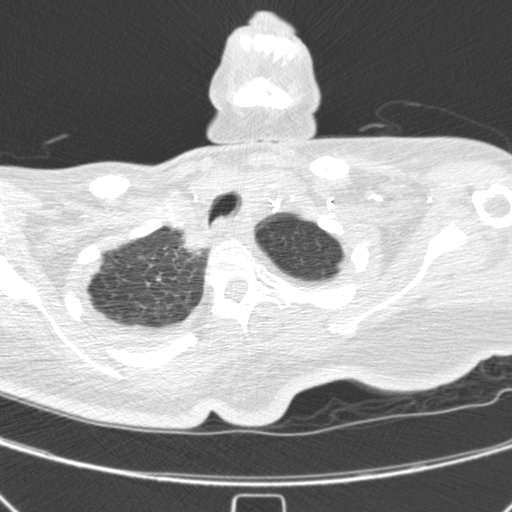
[im 249/270  mediastinal]
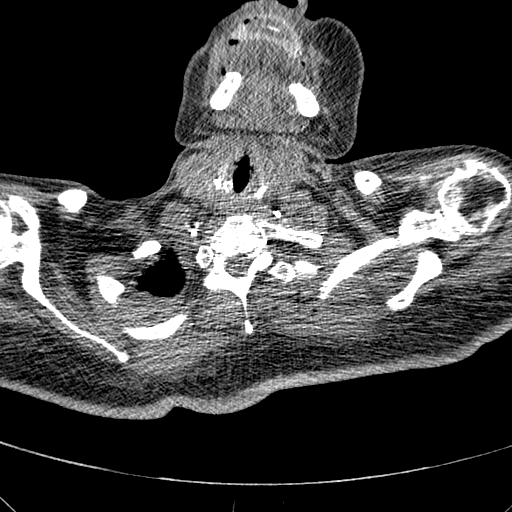

[Series 509: coronal mpr · coronal · 0.61mm/px · 1 of 106 slices shown]
[im 53/106  mediastinal]
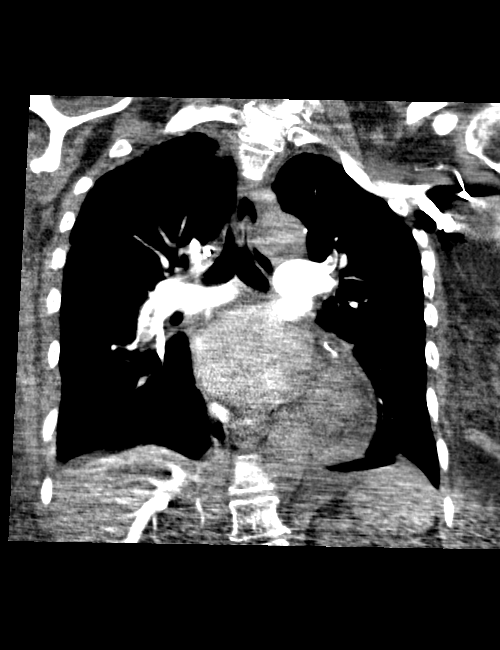

[13 of 36 positions shown; findings below may reference images not displayed]

FINDINGS: Mediastinum/Lymph Nodes: Positive for bilateral pulmonary emboli.
There is clot extending into the segmental branches in left upper
lobe and left lower lobar artery. Clot extending into the left lower
lobe segmental branches. There is clot in the proximal right lower
lobe branches and there may be a small amount of clot in the
proximal right middle lobe branch. There is no significant
enlargement of the right ventricle. No evidence to suggest right
heart strain. There are coronary artery calcifications. No
significant pericardial fluid. Evaluation for chest lymphadenopathy
is limited but no significant chest lymphadenopathy. Evidence for
subcutaneous edema throughout the chest.

Lungs/Pleura: Small to moderate sized bilateral pleural effusions,
right side greater than left. Pleural effusions are slightly smaller
compared to the study in 2830. There is volume loss and atelectasis
in the left lower lobe. The trachea and mainstem bronchi are patent.
4 mm nodule in the anterior right upper lobe on sequence 507, image
29 is essentially stable since 03/07/2015.

Upper abdomen: Contrast refluxes into the hepatic veins. Limited
evaluation of the upper abdominal structures.

Musculoskeletal: Striated appearance of the vertebral bodies
probably related to osteopenia and underlying paralysis. Vertebral
body heights are maintained. Some motion artifact in the sternal
region.

Review of the MIP images confirms the above findings.
IMPRESSION: Positive for bilateral pulmonary emboli. Moderate clot burden. No
evidence for right heart strain.

Small to moderate sized bilateral pleural effusions with atelectasis
or scarring at the left lung base.

4 mm nodule in the right upper lobe has not significantly changed
since [DATE] [DATE]. This small nodule is indeterminate. No
follow-up needed if patient is low-risk. Non-contrast chest CT can
be considered in 12 months if patient is high-risk. This
recommendation follows the consensus statement: Guidelines for
Management of Incidental Pulmonary Nodules Detected on CT
Images:From the [HOSPITAL] 8614; published online before
print (10.1148/[id number]).

## 2016-09-11 DIAGNOSIS — Z86718 Personal history of other venous thrombosis and embolism: Secondary | ICD-10-CM | POA: Diagnosis not present

## 2016-09-11 DIAGNOSIS — G35 Multiple sclerosis: Secondary | ICD-10-CM | POA: Diagnosis not present

## 2016-09-11 DIAGNOSIS — E119 Type 2 diabetes mellitus without complications: Secondary | ICD-10-CM | POA: Diagnosis not present

## 2016-09-11 DIAGNOSIS — L89154 Pressure ulcer of sacral region, stage 4: Secondary | ICD-10-CM | POA: Diagnosis not present

## 2016-09-11 DIAGNOSIS — I10 Essential (primary) hypertension: Secondary | ICD-10-CM | POA: Diagnosis not present

## 2016-10-09 ENCOUNTER — Encounter (HOSPITAL_BASED_OUTPATIENT_CLINIC_OR_DEPARTMENT_OTHER): Payer: Medicare Other | Attending: Internal Medicine

## 2016-10-09 DIAGNOSIS — E119 Type 2 diabetes mellitus without complications: Secondary | ICD-10-CM | POA: Diagnosis not present

## 2016-10-09 DIAGNOSIS — G35 Multiple sclerosis: Secondary | ICD-10-CM | POA: Diagnosis not present

## 2016-10-09 DIAGNOSIS — I890 Lymphedema, not elsewhere classified: Secondary | ICD-10-CM | POA: Insufficient documentation

## 2016-10-09 DIAGNOSIS — I10 Essential (primary) hypertension: Secondary | ICD-10-CM | POA: Insufficient documentation

## 2016-10-09 DIAGNOSIS — L89154 Pressure ulcer of sacral region, stage 4: Secondary | ICD-10-CM | POA: Diagnosis not present

## 2016-10-09 DIAGNOSIS — Z86718 Personal history of other venous thrombosis and embolism: Secondary | ICD-10-CM | POA: Diagnosis not present

## 2016-11-06 ENCOUNTER — Encounter (HOSPITAL_BASED_OUTPATIENT_CLINIC_OR_DEPARTMENT_OTHER): Payer: Medicare Other | Attending: Internal Medicine

## 2016-11-06 DIAGNOSIS — L89154 Pressure ulcer of sacral region, stage 4: Secondary | ICD-10-CM | POA: Insufficient documentation

## 2016-11-06 DIAGNOSIS — E119 Type 2 diabetes mellitus without complications: Secondary | ICD-10-CM | POA: Insufficient documentation

## 2016-11-06 DIAGNOSIS — G35 Multiple sclerosis: Secondary | ICD-10-CM | POA: Diagnosis not present

## 2016-11-06 DIAGNOSIS — I10 Essential (primary) hypertension: Secondary | ICD-10-CM | POA: Diagnosis not present

## 2016-11-06 DIAGNOSIS — Z86718 Personal history of other venous thrombosis and embolism: Secondary | ICD-10-CM | POA: Insufficient documentation

## 2016-11-20 NOTE — Addendum Note (Signed)
Addendum  created 11/20/16 1018 by Myrtie Soman, MD   Sign clinical note

## 2016-12-04 ENCOUNTER — Encounter (HOSPITAL_BASED_OUTPATIENT_CLINIC_OR_DEPARTMENT_OTHER): Payer: Medicare Other | Attending: Internal Medicine

## 2016-12-04 ENCOUNTER — Other Ambulatory Visit (HOSPITAL_COMMUNITY)
Admission: RE | Admit: 2016-12-04 | Discharge: 2016-12-04 | Disposition: A | Discharge status: Home / Self Care | Payer: Medicare Other | Source: Other Acute Inpatient Hospital | Attending: Internal Medicine | Admitting: Internal Medicine

## 2016-12-04 DIAGNOSIS — G35 Multiple sclerosis: Secondary | ICD-10-CM | POA: Insufficient documentation

## 2016-12-04 DIAGNOSIS — L89154 Pressure ulcer of sacral region, stage 4: Secondary | ICD-10-CM | POA: Insufficient documentation

## 2016-12-04 DIAGNOSIS — I10 Essential (primary) hypertension: Secondary | ICD-10-CM | POA: Insufficient documentation

## 2016-12-04 DIAGNOSIS — E11621 Type 2 diabetes mellitus with foot ulcer: Secondary | ICD-10-CM | POA: Insufficient documentation

## 2016-12-04 DIAGNOSIS — Z86718 Personal history of other venous thrombosis and embolism: Secondary | ICD-10-CM | POA: Insufficient documentation

## 2016-12-04 DIAGNOSIS — L97522 Non-pressure chronic ulcer of other part of left foot with fat layer exposed: Secondary | ICD-10-CM | POA: Insufficient documentation

## 2016-12-04 DIAGNOSIS — E11622 Type 2 diabetes mellitus with other skin ulcer: Secondary | ICD-10-CM | POA: Insufficient documentation

## 2016-12-07 LAB — AEROBIC CULTURE  (SUPERFICIAL SPECIMEN)

## 2016-12-07 LAB — AEROBIC CULTURE W GRAM STAIN (SUPERFICIAL SPECIMEN)

## 2017-01-01 ENCOUNTER — Encounter (HOSPITAL_BASED_OUTPATIENT_CLINIC_OR_DEPARTMENT_OTHER): Payer: Medicare Other | Attending: Internal Medicine

## 2017-01-01 DIAGNOSIS — I10 Essential (primary) hypertension: Secondary | ICD-10-CM | POA: Diagnosis not present

## 2017-01-01 DIAGNOSIS — Z86718 Personal history of other venous thrombosis and embolism: Secondary | ICD-10-CM | POA: Insufficient documentation

## 2017-01-01 DIAGNOSIS — E11621 Type 2 diabetes mellitus with foot ulcer: Secondary | ICD-10-CM | POA: Insufficient documentation

## 2017-01-01 DIAGNOSIS — L97522 Non-pressure chronic ulcer of other part of left foot with fat layer exposed: Secondary | ICD-10-CM | POA: Diagnosis not present

## 2017-01-01 DIAGNOSIS — L89154 Pressure ulcer of sacral region, stage 4: Secondary | ICD-10-CM | POA: Insufficient documentation

## 2017-01-15 ENCOUNTER — Ambulatory Visit: Payer: Medicare Other | Admitting: Podiatry

## 2017-01-29 ENCOUNTER — Encounter (HOSPITAL_BASED_OUTPATIENT_CLINIC_OR_DEPARTMENT_OTHER): Payer: Medicare Other

## 2017-02-01 ENCOUNTER — Encounter: Payer: Self-pay | Admitting: Podiatry

## 2017-02-01 ENCOUNTER — Ambulatory Visit (INDEPENDENT_AMBULATORY_CARE_PROVIDER_SITE_OTHER): Payer: Medicare Other | Admitting: Podiatry

## 2017-02-01 DIAGNOSIS — L03032 Cellulitis of left toe: Secondary | ICD-10-CM | POA: Diagnosis not present

## 2017-02-01 DIAGNOSIS — L02612 Cutaneous abscess of left foot: Secondary | ICD-10-CM

## 2017-02-01 DIAGNOSIS — L600 Ingrowing nail: Secondary | ICD-10-CM

## 2017-02-01 MED ORDER — CEPHALEXIN 500 MG PO CAPS: 500.0000 mg | ORAL_CAPSULE | Freq: Three times a day (TID) | ORAL | 0 refills | Status: DC

## 2017-02-05 NOTE — Progress Notes (Signed)
Subjective: 61 year old female presents the office in stretcher via ambulance due to concerns of infection and ingrown toenail left big toe which is been ongoing the last several weeks. She says the wound care doctor care facility try cleaning the areas well. She is not currently on any antibiotics. She has noticed some small minor pus coming from the nail corner mostly on the lateral aspect. She is also a small amount of swelling or redness from the toenail but denies any red streaks. Denies any systemic complaints such as fevers, chills, nausea, vomiting. No acute changes since last appointment, and no other complaints at this time.   Objective: AAO x3, NAD DP/PT pulses palpable but decreased bilaterally Left hallux toenail is loose with underlying nail bed and only here on the proximal aspect. Small amount of purulence is expressed from the lateral nail corner. Is localized edema and erythema along the nail corner but there is no ascending cellulitis. Is no fluctuance or crepitus. There is no malodor. No open lesions or pre-ulcerative lesions.  No pain with calf compression, swelling, warmth, erythema  Assessment: Ingrown toenail localized infection left hallux toenail  Plan: -All treatment options discussed with the patient including all alternatives, risks, complications.  -Nails very loose and underlying nailbed. I discussed the nail avulsion given the infection however she declined this today. I'll able to sharply debrided toenail to remove the offending portion of the nail corner without any complications or bleeding. Small part of the nail did remain. I did debride the area to healthy tissue and there is no further purulence expressed. Recommended small amount into antibiotic ointment dressing changes daily. Prescribed Keflex. -Monitor for any clinical signs or symptoms of infection and directed to call the office immediately should any occur or go to the ER. -RTC as scheduled in 2 weeks or  sooner if needed. -Patient encouraged to call the office with any questions, concerns, change in symptoms.   Celesta Gentile, DPM

## 2017-02-15 ENCOUNTER — Ambulatory Visit (INDEPENDENT_AMBULATORY_CARE_PROVIDER_SITE_OTHER): Payer: Medicare Other | Admitting: Podiatry

## 2017-02-15 ENCOUNTER — Encounter: Payer: Self-pay | Admitting: Podiatry

## 2017-02-15 DIAGNOSIS — L600 Ingrowing nail: Secondary | ICD-10-CM | POA: Diagnosis not present

## 2017-02-15 DIAGNOSIS — L03032 Cellulitis of left toe: Secondary | ICD-10-CM

## 2017-02-15 DIAGNOSIS — L02612 Cutaneous abscess of left foot: Secondary | ICD-10-CM | POA: Diagnosis not present

## 2017-02-21 NOTE — Progress Notes (Signed)
Subjective: Jocelyn Sanchez presents the office they via stretcher/ambulance for follow-up evaluation of infection the left hallux toenail. She states that where she lives they've been putting antibiotic ointment on the area and she has been taken antibiotics as directed. She states that she has not has any drainage or pus and she denies any swelling or redness to her feet although she doesn't that she cannot see it very well. Denies any systemic complaints such as fevers, chills, nausea, vomiting. No acute changes since last appointment, and no other complaints at this time.   Objective: AAO x3, NAD DP/PT pulses palpable bilaterally, CRT less than 3 seconds Left hallux toenailwent dystrophic, discolored. I debrided small portion the nail continue was no further drainage or pus expressed there is no surrounding erythema or edema to the toe. There is no open sores identified in the area between healing well. There is no clinical signs of infection present today. Only a small amount of nail does remain on the proximal nail border. No open lesions or pre-ulcerative lesions.  No pain with calf compression, swelling, warmth, erythema  Assessment: Resolved infection left hallux toenail  Plan: -All treatment options discussed with the patient including all alternatives, risks, complications.  -I therapy a small amount of the nail today to make sure is no further purulence fluid.  Continue a small amount of antibiotic ointment and a Band-Aid daily. -Monitor for any clinical signs or symptoms of infection and directed to call the office immediately should any occur or go to the ER. -Recommend follow-up in 3 months for routine care to help prevent this from happening again. -Patient encouraged to call the office with any questions, concerns, change in symptoms.   Celesta Gentile, DPM

## 2017-08-07 ENCOUNTER — Inpatient Hospital Stay (HOSPITAL_COMMUNITY)
Admission: EM | Admit: 2017-08-07 | Discharge: 2017-08-17 | DRG: 871 | Disposition: A | Discharge status: Home Health | Payer: Medicare Other | Attending: Internal Medicine | Admitting: Internal Medicine

## 2017-08-07 ENCOUNTER — Encounter (HOSPITAL_COMMUNITY): Payer: Self-pay

## 2017-08-07 DIAGNOSIS — Z452 Encounter for adjustment and management of vascular access device: Secondary | ICD-10-CM

## 2017-08-07 DIAGNOSIS — D72829 Elevated white blood cell count, unspecified: Secondary | ICD-10-CM | POA: Diagnosis present

## 2017-08-07 DIAGNOSIS — A419 Sepsis, unspecified organism: Secondary | ICD-10-CM

## 2017-08-07 DIAGNOSIS — Z66 Do not resuscitate: Secondary | ICD-10-CM | POA: Diagnosis not present

## 2017-08-07 DIAGNOSIS — F419 Anxiety disorder, unspecified: Secondary | ICD-10-CM | POA: Diagnosis present

## 2017-08-07 DIAGNOSIS — R197 Diarrhea, unspecified: Secondary | ICD-10-CM | POA: Diagnosis present

## 2017-08-07 DIAGNOSIS — D638 Anemia in other chronic diseases classified elsewhere: Secondary | ICD-10-CM | POA: Diagnosis present

## 2017-08-07 DIAGNOSIS — I10 Essential (primary) hypertension: Secondary | ICD-10-CM | POA: Diagnosis present

## 2017-08-07 DIAGNOSIS — Z7901 Long term (current) use of anticoagulants: Secondary | ICD-10-CM

## 2017-08-07 DIAGNOSIS — A4159 Other Gram-negative sepsis: Secondary | ICD-10-CM | POA: Diagnosis not present

## 2017-08-07 DIAGNOSIS — R7881 Bacteremia: Secondary | ICD-10-CM | POA: Diagnosis present

## 2017-08-07 DIAGNOSIS — Z7401 Bed confinement status: Secondary | ICD-10-CM

## 2017-08-07 DIAGNOSIS — Z820 Family history of epilepsy and other diseases of the nervous system: Secondary | ICD-10-CM

## 2017-08-07 DIAGNOSIS — I5022 Chronic systolic (congestive) heart failure: Secondary | ICD-10-CM | POA: Diagnosis present

## 2017-08-07 DIAGNOSIS — N136 Pyonephrosis: Secondary | ICD-10-CM | POA: Diagnosis present

## 2017-08-07 DIAGNOSIS — D696 Thrombocytopenia, unspecified: Secondary | ICD-10-CM | POA: Diagnosis present

## 2017-08-07 DIAGNOSIS — I739 Peripheral vascular disease, unspecified: Secondary | ICD-10-CM | POA: Diagnosis present

## 2017-08-07 DIAGNOSIS — N1330 Unspecified hydronephrosis: Secondary | ICD-10-CM

## 2017-08-07 DIAGNOSIS — R509 Fever, unspecified: Secondary | ICD-10-CM | POA: Diagnosis not present

## 2017-08-07 DIAGNOSIS — Z86711 Personal history of pulmonary embolism: Secondary | ICD-10-CM

## 2017-08-07 DIAGNOSIS — I110 Hypertensive heart disease with heart failure: Secondary | ICD-10-CM | POA: Diagnosis present

## 2017-08-07 DIAGNOSIS — Z87440 Personal history of urinary (tract) infections: Secondary | ICD-10-CM

## 2017-08-07 DIAGNOSIS — L89153 Pressure ulcer of sacral region, stage 3: Secondary | ICD-10-CM | POA: Diagnosis present

## 2017-08-07 DIAGNOSIS — R6521 Severe sepsis with septic shock: Secondary | ICD-10-CM | POA: Diagnosis present

## 2017-08-07 DIAGNOSIS — G35 Multiple sclerosis: Secondary | ICD-10-CM | POA: Diagnosis present

## 2017-08-07 DIAGNOSIS — Z882 Allergy status to sulfonamides status: Secondary | ICD-10-CM

## 2017-08-07 DIAGNOSIS — Z87891 Personal history of nicotine dependence: Secondary | ICD-10-CM

## 2017-08-07 DIAGNOSIS — N179 Acute kidney failure, unspecified: Secondary | ICD-10-CM | POA: Diagnosis present

## 2017-08-07 DIAGNOSIS — E876 Hypokalemia: Secondary | ICD-10-CM | POA: Diagnosis not present

## 2017-08-07 DIAGNOSIS — E1151 Type 2 diabetes mellitus with diabetic peripheral angiopathy without gangrene: Secondary | ICD-10-CM | POA: Diagnosis present

## 2017-08-07 DIAGNOSIS — R0682 Tachypnea, not elsewhere classified: Secondary | ICD-10-CM

## 2017-08-07 DIAGNOSIS — A498 Other bacterial infections of unspecified site: Secondary | ICD-10-CM | POA: Diagnosis present

## 2017-08-07 DIAGNOSIS — J189 Pneumonia, unspecified organism: Secondary | ICD-10-CM | POA: Diagnosis present

## 2017-08-07 DIAGNOSIS — E871 Hypo-osmolality and hyponatremia: Secondary | ICD-10-CM | POA: Diagnosis present

## 2017-08-07 DIAGNOSIS — E872 Acidosis: Secondary | ICD-10-CM | POA: Diagnosis present

## 2017-08-07 LAB — I-STAT CHEM 8, ED
BUN: 49 mg/dL — ABNORMAL HIGH (ref 6–20)
CALCIUM ION: 0.99 mmol/L — AB (ref 1.15–1.40)
CREATININE: 1.9 mg/dL — AB (ref 0.44–1.00)
Chloride: 93 mmol/L — ABNORMAL LOW (ref 101–111)
Glucose, Bld: 342 mg/dL — ABNORMAL HIGH (ref 65–99)
HCT: 29 % — ABNORMAL LOW (ref 36.0–46.0)
Hemoglobin: 9.9 g/dL — ABNORMAL LOW (ref 12.0–15.0)
Potassium: 4 mmol/L (ref 3.5–5.1)
Sodium: 126 mmol/L — ABNORMAL LOW (ref 135–145)
TCO2: 18 mmol/L — AB (ref 22–32)

## 2017-08-07 LAB — URINALYSIS, ROUTINE W REFLEX MICROSCOPIC
Bilirubin Urine: NEGATIVE
Glucose, UA: NEGATIVE mg/dL
KETONES UR: 5 mg/dL — AB
Nitrite: NEGATIVE
Specific Gravity, Urine: 1.018 (ref 1.005–1.030)
pH: 5 (ref 5.0–8.0)

## 2017-08-07 LAB — BLOOD GAS, VENOUS
ACID-BASE DEFICIT: 7.4 mmol/L — AB (ref 0.0–2.0)
Bicarbonate: 16.8 mmol/L — ABNORMAL LOW (ref 20.0–28.0)
O2 SAT: 45.1 %
PCO2 VEN: 32.1 mmHg — AB (ref 44.0–60.0)
Patient temperature: 99.8
pH, Ven: 7.343 (ref 7.250–7.430)

## 2017-08-07 LAB — CBG MONITORING, ED: Glucose-Capillary: 295 mg/dL — ABNORMAL HIGH (ref 65–99)

## 2017-08-07 LAB — BRAIN NATRIURETIC PEPTIDE: B Natriuretic Peptide: 96.8 pg/mL (ref 0.0–100.0)

## 2017-08-07 LAB — I-STAT CG4 LACTIC ACID, ED: Lactic Acid, Venous: 5.14 mmol/L (ref 0.5–1.9)

## 2017-08-07 LAB — INFLUENZA PANEL BY PCR (TYPE A & B)
INFLAPCR: NEGATIVE
Influenza B By PCR: NEGATIVE

## 2017-08-07 LAB — I-STAT TROPONIN, ED: TROPONIN I, POC: 0.01 ng/mL (ref 0.00–0.08)

## 2017-08-07 MED ORDER — SODIUM CHLORIDE 0.9 % IV BOLUS (SEPSIS)
1000.0000 mL | Freq: Once | INTRAVENOUS | Status: AC
  Administered 2017-08-07: 1000 mL via INTRAVENOUS

## 2017-08-07 MED ORDER — ACETAMINOPHEN 500 MG PO TABS
1000.0000 mg | ORAL_TABLET | Freq: Once | ORAL | Status: AC
  Administered 2017-08-07: 1000 mg via ORAL
  Filled 2017-08-07: qty 2

## 2017-08-07 MED ORDER — VANCOMYCIN HCL IN DEXTROSE 1-5 GM/200ML-% IV SOLN
1000.0000 mg | Freq: Once | INTRAVENOUS | Status: AC
  Administered 2017-08-07: 1000 mg via INTRAVENOUS
  Filled 2017-08-07: qty 200

## 2017-08-07 MED ORDER — PIPERACILLIN-TAZOBACTAM 3.375 G IVPB 30 MIN
3.3750 g | Freq: Once | INTRAVENOUS | Status: AC
  Administered 2017-08-07: 3.375 g via INTRAVENOUS
  Filled 2017-08-07: qty 50

## 2017-08-07 NOTE — Progress Notes (Signed)
A consult was received from an ED physician for vanc and zosyn per pharmacy dosing.  The patient's profile has been reviewed for ht/wt/allergies/indication/available labs.   A one time order has been placed for vancomycin 1gm, zosyn 3.375mg .  Further antibiotics/pharmacy consults should be ordered by admitting physician if indicated.                       Thank you, Dolly Rias RPh 08/07/2017, 9:46 PM Pager [phone]

## 2017-08-07 NOTE — ED Notes (Signed)
Bed: IF02 Expected date:  Expected time:  Means of arrival:  Comments: 66 yr sepsis, fever, vomiting, diarrhea

## 2017-08-07 NOTE — ED Notes (Signed)
Notified EDP,Cardama,MD. Pt. I-stat CG4 Lactic acid results 5.14, sodium 126 and hemoglobin 9.9 and RN,Holly made aware.

## 2017-08-07 NOTE — ED Triage Notes (Signed)
Pt arrived from home via [facility] due to vomiting over the last 2 days and diarrhea for three days. Home health nurse states emesis was a dark brown color.

## 2017-08-07 NOTE — ED Provider Notes (Addendum)
[facility] DEPT Provider Note  CSN: [id number] Arrival date & time: 08/07/17 2041  Chief Complaint(s) Fever and Hyperglycemia  HPI Jocelyn Sanchez is a 62 y.o. female with extensive past medical history listed below including multiple sclerosis, diabetes presents to the emergency department with 3 days of nausea, nonbloody nonbilious emesis and nonbloody diarrhea.  She is also endorsing chills.  Denies any respiratory symptoms.  Denies any chest pain or shortness of breath.  No abdominal pain.  No dysuria.  No alleviating or aggravating factors.  She denies any other physical complaints.  She denies any known sick contacts.  No suspicious food intake.  HPI  Past Medical History Past Medical History:  Diagnosis Date  . Anemia   . DM II (diabetes mellitus, type II), controlled ([place])   . Gait disorder   . HTN (hypertension)   . Lymphedema   . MS (multiple sclerosis) ([place])   . Pulmonary embolism [facility])    Patient Active Problem List   Diagnosis Date Noted  . Acute upper GI bleed 07/12/2016  . Gastric and duodenal angiodysplasia   . Esophageal stricture   . Gastroesophageal reflux disease with esophagitis   . Pressure ulcer 10/27/2015  . Pulmonary emboli ([place]) 10/26/2015  . Bilateral pulmonary embolism ([place]) 10/26/2015  . PVD (peripheral vascular disease) ([place]) 07/04/2015  . Essential hypertension, benign 06/17/2015  . Decubitus ulcer of ankle, stage 4 ([place]) 05/10/2015  . Decubitus ulcer of coccygeal region 05/10/2015  . FUO (fever of unknown origin)   . Back pain   . Proctitis   . Citrobacter infection 03/08/2015  . Fever 03/08/2015  . Sepsis due to Escherichia coli with acute renal failure ([place]) 03/06/2015  . Septic shock ([place]) 03/06/2015  . E. coli UTI (urinary tract infection) 03/06/2015  . Diabetes mellitus with peripheral vascular disease ([place]) 03/06/2015  . Leukocytosis 03/06/2015  . Hypophosphatemia 03/06/2015  . Hypomagnesemia  03/06/2015  . Anemia of chronic disease 03/06/2015  . Decubitus ulcer, stage 2 03/06/2015  . Lymphedema 03/06/2015  . Hypokalemia 05/11/2011  . Multiple sclerosis ([place]) 05/11/2011   Home Medication(s) Prior to Admission medications   Medication Sig Start Date End Date Taking? Authorizing Provider  ALPRAZolam Duanne Moron) 0.5 MG tablet Take 0.5 mg by mouth 2 (two) times daily as needed for anxiety.   Yes [provider]  apixaban (ELIQUIS) 2.5 MG TABS tablet Take 1 tablet (2.5 mg total) by mouth 2 (two) times daily. 07/14/16  Yes Dixie Dials, MD  carvedilol (COREG) 3.125 MG tablet Take 3.125 mg by mouth 2 (two) times daily. 04/25/15  Yes [provider]  furosemide (LASIX) 20 MG tablet Take 20 mg by mouth daily. On Monday, Wednesday and Friday   Yes [provider]  HYDROcodone-acetaminophen (NORCO/VICODIN) 5-325 MG per tablet Take 2 tablets by mouth every 4 (four) hours as needed for moderate pain. 03/12/15  Yes Robbie Lis, MD  methocarbamol (ROBAXIN) 500 MG tablet Take 0.5 tablets (250 mg total) by mouth every 8 (eight) hours as needed for muscle spasms. 03/12/15  Yes Robbie Lis, MD  pantoprazole (PROTONIX) 40 MG tablet Take 1 tablet (40 mg total) by mouth 2 (two) times daily. 07/14/16  Yes Dixie Dials, MD  potassium chloride (K-DUR,KLOR-CON) 10 MEQ tablet Take 10 mEq by mouth daily. On Monday, Wednesday and Friday   Yes [provider]  promethazine (PHENERGAN) 25 MG tablet Take 25 mg by mouth every 6 (six) hours as needed for nausea or vomiting.  Yes [provider]  sacubitril-valsartan (ENTRESTO) 24-26 MG Take 1 tablet by mouth 2 (two) times daily. 10/30/15  Yes Charolette Forward, MD  acetaminophen (TYLENOL) 325 MG tablet Take 2 tablets (650 mg total) by mouth every 6 (six) hours as needed for mild pain (or Fever >/= 101). Patient not taking: Reported on 08/07/2017 03/12/15   Robbie Lis, MD  cephALEXin (KEFLEX) 500 MG capsule Take 1 capsule  (500 mg total) by mouth 3 (three) times daily. Patient not taking: Reported on 08/07/2017 02/01/17   Trula Slade, DPM  ciprofloxacin (CIPRO) 250 MG tablet Take 1 tablet (250 mg total) by mouth 2 (two) times daily. Patient not taking: Reported on 08/07/2017 07/14/16   Dixie Dials, MD  docusate sodium (COLACE) 100 MG capsule Take 1 capsule (100 mg total) by mouth 2 (two) times daily. Patient not taking: Reported on 08/07/2017 05/13/15   Dixie Dials, MD  glipiZIDE (GLUCOTROL) 5 MG tablet Take 1 tablet (5 mg total) by mouth 2 (two) times daily. Patient not taking: Reported on 08/07/2017 10/30/15   Charolette Forward, MD  zolpidem (AMBIEN) 5 MG tablet Take 1 tablet (5 mg total) by mouth at bedtime as needed for sleep (insomnia). Patient not taking: Reported on 08/07/2017 05/18/15   Estill Dooms, MD                                                                                                                                    Past Surgical History Past Surgical History:  Procedure Laterality Date  . ESOPHAGOGASTRODUODENOSCOPY N/A 07/12/2016   Procedure: ESOPHAGOGASTRODUODENOSCOPY (EGD);  Surgeon: Irene Shipper, MD;  Location: [facility];  Service: Endoscopy;  Laterality: N/A;  . FLEXIBLE SIGMOIDOSCOPY Left 03/09/2015   Procedure: FLEXIBLE SIGMOIDOSCOPY;  Surgeon: Carol Ada, MD;  Location: [facility] ENDOSCOPY;  Service: Endoscopy;  Laterality: Left;   Family History Family History  Problem Relation Age of Onset  . Multiple sclerosis Mother     Social History Social History   Tobacco Use  . Smoking status: Former Smoker    Types: Cigarettes  . Smokeless tobacco: Never Used  Substance Use Topics  . Alcohol use: No  . Drug use: No   Allergies Sulfa antibiotics  Review of Systems Review of Systems All other systems are reviewed and are negative for acute change except as noted in the HPI  Physical Exam Vital Signs  I have reviewed the triage vital signs BP (!) 85/43   Pulse (!) 104    Temp (!) 103.9 F (39.9 C) (Rectal)   Resp 20   SpO2 98%   Physical Exam  Constitutional: She is oriented to person, place, and time. She appears well-developed and well-nourished. No distress.  HENT:  Head: Normocephalic and atraumatic.  Nose: Nose normal.  Eyes: Conjunctivae and EOM are normal. Pupils are equal, round, and reactive to light. Right eye exhibits no discharge. Left eye exhibits no discharge. No  scleral icterus.  Neck: Normal range of motion. Neck supple.  Cardiovascular: Regular rhythm. Tachycardia present. Exam reveals no gallop and no friction rub.  No murmur heard. Pulmonary/Chest: Effort normal and breath sounds normal. No stridor. No respiratory distress. She has no rales.  Abdominal: Soft. She exhibits no distension. There is no tenderness. There is no rigidity, no rebound, no guarding and no CVA tenderness.  Musculoskeletal: She exhibits no edema or tenderness.       Back:  Neurological: She is alert and oriented to person, place, and time.  Contractures of the upper and lower extremities.  Skin: Skin is warm and dry. No rash noted. She is not diaphoretic. No erythema.  Psychiatric: She has a normal mood and affect.  Vitals reviewed.   ED Results and Treatments Labs (all labs ordered are listed, but only abnormal results are displayed) Labs Reviewed  URINALYSIS, ROUTINE W REFLEX MICROSCOPIC - Abnormal; Notable for the following components:      Result Value   Color, Urine AMBER (*)    APPearance TURBID (*)    Hgb urine dipstick LARGE (*)    Ketones, ur 5 (*)    Protein, ur >=300 (*)    Leukocytes, UA LARGE (*)    Bacteria, UA MANY (*)    Squamous Epithelial / LPF 0-5 (*)    All other components within normal limits  BLOOD GAS, VENOUS - Abnormal; Notable for the following components:   pCO2, Ven 32.1 (*)    Bicarbonate 16.8 (*)    Acid-base deficit 7.4 (*)    All other components within normal limits  CBC WITH DIFFERENTIAL/PLATELET - Abnormal;  Notable for the following components:   WBC 23.6 (*)    RBC 3.09 (*)    Hemoglobin 7.6 (*)    HCT 23.1 (*)    MCV 74.8 (*)    MCH 24.6 (*)    RDW 17.1 (*)    All other components within normal limits  COMPREHENSIVE METABOLIC PANEL - Abnormal; Notable for the following components:   Sodium 128 (*)    Chloride 97 (*)    CO2 14 (*)    Glucose, Bld 345 (*)    BUN 46 (*)    Creatinine, Ser 1.95 (*)    Calcium 7.6 (*)    Total Protein 5.9 (*)    Albumin 2.3 (*)    AST 69 (*)    Total Bilirubin 1.3 (*)    GFR calc non Af Amer 26 (*)    GFR calc Af Amer 31 (*)    Anion gap 17 (*)    All other components within normal limits  PROTIME-INR - Abnormal; Notable for the following components:   Prothrombin Time 18.1 (*)    All other components within normal limits  I-STAT CG4 LACTIC ACID, ED - Abnormal; Notable for the following components:   Lactic Acid, Venous 5.14 (*)    All other components within normal limits  CBG MONITORING, ED - Abnormal; Notable for the following components:   Glucose-Capillary 295 (*)    All other components within normal limits  I-STAT CHEM 8, ED - Abnormal; Notable for the following components:   Sodium 126 (*)    Chloride 93 (*)    BUN 49 (*)    Creatinine, Ser 1.90 (*)    Glucose, Bld 342 (*)    Calcium, Ion 0.99 (*)    TCO2 18 (*)    Hemoglobin 9.9 (*)    HCT 29.0 (*)  All other components within normal limits  CULTURE, BLOOD (ROUTINE X 2)  CULTURE, BLOOD (ROUTINE X 2)  URINE CULTURE  GASTROINTESTINAL PANEL BY PCR, STOOL (REPLACES STOOL CULTURE)  BRAIN NATRIURETIC PEPTIDE  INFLUENZA PANEL BY PCR (TYPE A & B)  CBC WITH DIFFERENTIAL/PLATELET  OCCULT BLOOD X 1 CARD TO LAB, STOOL  I-STAT TROPONIN, ED  I-STAT CG4 LACTIC ACID, ED                                                                                                                         EKG  EKG Interpretation  Date/Time:  Tuesday August 07 2017 21:34:43 EST Ventricular Rate:   132 PR Interval:    QRS Duration: 91 QT Interval:  300 QTC Calculation: 445 R Axis:   -56 Text Interpretation:  Sinus tachycardia Left anterior fascicular block Abnormal R-wave progression, early transition Left ventricular hypertrophy Otherwise no significant change Confirmed by Addison Lank [id number]) on 08/07/2017 9:37:15 PM      Radiology No results found. Pertinent labs & imaging results that were available during my care of the patient were reviewed by me and considered in my medical decision making (see chart for details).  Medications Ordered in ED Medications  hydrocortisone sodium succinate (SOLU-CORTEF) 100 MG injection 50 mg (not administered)  norepinephrine (LEVOPHED) 4mg  in D5W 276mL premix infusion (not administered)  sodium chloride 0.9 % bolus 1,000 mL (0 mLs Intravenous Stopped 08/07/17 2331)    And  sodium chloride 0.9 % bolus 1,000 mL (0 mLs Intravenous Stopped 08/07/17 2331)  piperacillin-tazobactam (ZOSYN) IVPB 3.375 g (0 g Intravenous Stopped 08/07/17 2224)  vancomycin (VANCOCIN) IVPB 1000 mg/200 mL premix (0 mg Intravenous Stopped 08/07/17 2331)  acetaminophen (TYLENOL) tablet 1,000 mg (1,000 mg Oral Given 08/07/17 2221)  sodium chloride 0.9 % bolus 1,000 mL (1,000 mLs Intravenous New Bag/Given 08/07/17 2358)  sodium chloride 0.9 % bolus 1,000 mL (1,000 mLs Intravenous New Bag/Given 08/07/17 2358)                                                                                                                                    Procedures .Central Line Date/Time: 08/08/2017 1:30 AM Performed by: Fatima Blank, MD Authorized by: Fatima Blank, MD   Consent:    Consent obtained:  Verbal   Consent given by:  Patient   Risks discussed:  Incorrect placement, infection, pneumothorax and bleeding Pre-procedure  details:    Hand hygiene: Hand hygiene performed prior to insertion     Sterile barrier technique: All elements of maximal sterile technique  followed     Skin preparation:  ChloraPrep   Skin preparation agent: Skin preparation agent completely dried prior to procedure   Anesthesia (see MAR for exact dosages):    Anesthesia method:  Local infiltration   Local anesthetic:  Lidocaine 1% w/o epi Procedure details:    Location:  R internal jugular   Site selection rationale:  Anticoagulation and body habitus   Patient position:  Flat   Procedural supplies:  Triple lumen   Catheter size:  6.5 Fr   Landmarks identified: yes     Ultrasound guidance: yes     Sterile ultrasound techniques: Sterile gel and sterile probe covers were used     Number of attempts:  1   Successful placement: yes   Post-procedure details:    Post-procedure:  Dressing applied   Assessment:  Blood return through all ports, free fluid flow, no pneumothorax on x-ray and placement verified by x-ray   Patient tolerance of procedure:  Tolerated well, no immediate complications     CRITICAL CARE Performed by: Grayce Sessions Rennie Hack Total critical care time: 75 minutes Critical care time was exclusive of separately billable procedures and treating other patients. Critical care was necessary to treat or prevent imminent or life-threatening deterioration. Critical care was time spent personally by me on the following activities: development of treatment plan with patient and/or surrogate as well as nursing, discussions with consultants, evaluation of patient's response to treatment, examination of patient, obtaining history from patient or surrogate, ordering and performing treatments and interventions, ordering and review of laboratory studies, ordering and review of radiographic studies, pulse oximetry and re-evaluation of patient's condition.   (including critical care time)  Medical Decision Making / ED Course I have reviewed the nursing notes for this encounter and the patient's prior records (if available in EHR or on provided paperwork).  Clinical Course  as of Aug 09 131  Tue Aug 07, 2017  2133 Patient reports 3 days of nausea, vomiting, diarrhea.  Noted to be febrile, tachycardic and hypotensive.  Code sepsis was initiated.  Patient started on 30cc/kg (of ideal body weight - due to CHF) IV fluid bolus and started on empiric antibiotics for unknown source.  [PC]  2255 Emergency Ultrasound Study:   Angiocath insertion Performed by: Fatima Blank  Consent: Verbal consent obtained. Risks and benefits: risks, benefits and alternatives were discussed Immediately prior to procedure the correct patient, procedure, equipment, support staff and site/side marked as needed.  Indication: difficult IV access Preparation: Patient and probe were prepped with chlorhexidine or alcohol. Sterile gel used. Vein Location: right cephalic vein was visualized during assessment for potential access sites and was found to be patent/ easily compressed with linear ultrasound.  1.88 inch, 20 gauge needle was visualized with real-time ultrasound and guided into the vein.  Image not saved. Normal blood return.  Patient tolerance: Patient tolerated the procedure well with no immediate complications.   [PC]  2300 Sepsis - Repeat Assessment  Performed at: 11:00 PM    Heart:     Tachycardic, but improved rate  Lungs:    CTA  Capillary Refill:   > 2 sec  Peripheral Pulse:   Radial pulse palpable  Skin:     Dry   [PC]  2340 Sepsis reassessment.  Patient's tachycardia is improving however she still hypotensive.  She has  received 30 cc/kg of IV fluids.  Additional 2 L were ordered.  [PC]  Wed Aug 08, 2017  0005 No improvement with additional IV fluids.  Solu-Cortef was ordered in case of possible adrenal insufficiency.  Levophed was ordered.   [PC]  0020 Case discussed with Dr. Tamala Julian from the ICU who accepted the patient is an ICU admission.  Sources possible urinary however patient was having reported diarrhea.  Influenza was negative.  GI panel was  ordered.  She denies any recent antibiotics and does not have any abdominal tenderness that would be concerning for C. Difficile or other serious intra-abdominal infections.  [PC]  9166 Central line placed.  Pressors initiated  [PC]    Clinical Course User Index [PC] Pema Thomure, Grayce Sessions, MD     Final Clinical Impression(s) / ED Diagnoses Final diagnoses:  Fever  Septic shock ([place])      This chart was dictated using voice recognition software.  Despite best efforts to proofread,  errors can occur which can change the documentation meaning.       Fatima Blank, MD 08/08/17 [phone]

## 2017-08-08 ENCOUNTER — Emergency Department (HOSPITAL_COMMUNITY): Payer: Medicare Other

## 2017-08-08 ENCOUNTER — Inpatient Hospital Stay (HOSPITAL_COMMUNITY): Payer: Medicare Other

## 2017-08-08 DIAGNOSIS — Z87891 Personal history of nicotine dependence: Secondary | ICD-10-CM | POA: Diagnosis not present

## 2017-08-08 DIAGNOSIS — Z820 Family history of epilepsy and other diseases of the nervous system: Secondary | ICD-10-CM | POA: Diagnosis not present

## 2017-08-08 DIAGNOSIS — E876 Hypokalemia: Secondary | ICD-10-CM | POA: Diagnosis not present

## 2017-08-08 DIAGNOSIS — Z882 Allergy status to sulfonamides status: Secondary | ICD-10-CM | POA: Diagnosis not present

## 2017-08-08 DIAGNOSIS — Z87440 Personal history of urinary (tract) infections: Secondary | ICD-10-CM | POA: Diagnosis not present

## 2017-08-08 DIAGNOSIS — R7881 Bacteremia: Secondary | ICD-10-CM | POA: Diagnosis not present

## 2017-08-08 DIAGNOSIS — L89153 Pressure ulcer of sacral region, stage 3: Secondary | ICD-10-CM | POA: Diagnosis present

## 2017-08-08 DIAGNOSIS — N179 Acute kidney failure, unspecified: Secondary | ICD-10-CM

## 2017-08-08 DIAGNOSIS — N210 Calculus in bladder: Secondary | ICD-10-CM | POA: Diagnosis not present

## 2017-08-08 DIAGNOSIS — R918 Other nonspecific abnormal finding of lung field: Secondary | ICD-10-CM | POA: Diagnosis not present

## 2017-08-08 DIAGNOSIS — R509 Fever, unspecified: Secondary | ICD-10-CM | POA: Diagnosis present

## 2017-08-08 DIAGNOSIS — R6521 Severe sepsis with septic shock: Secondary | ICD-10-CM | POA: Diagnosis not present

## 2017-08-08 DIAGNOSIS — Z86711 Personal history of pulmonary embolism: Secondary | ICD-10-CM | POA: Diagnosis not present

## 2017-08-08 DIAGNOSIS — Z7401 Bed confinement status: Secondary | ICD-10-CM | POA: Diagnosis not present

## 2017-08-08 DIAGNOSIS — A09 Infectious gastroenteritis and colitis, unspecified: Secondary | ICD-10-CM | POA: Diagnosis not present

## 2017-08-08 DIAGNOSIS — Z7901 Long term (current) use of anticoagulants: Secondary | ICD-10-CM | POA: Diagnosis not present

## 2017-08-08 DIAGNOSIS — D72829 Elevated white blood cell count, unspecified: Secondary | ICD-10-CM | POA: Diagnosis not present

## 2017-08-08 DIAGNOSIS — I5022 Chronic systolic (congestive) heart failure: Secondary | ICD-10-CM | POA: Diagnosis present

## 2017-08-08 DIAGNOSIS — N200 Calculus of kidney: Secondary | ICD-10-CM | POA: Diagnosis not present

## 2017-08-08 DIAGNOSIS — I340 Nonrheumatic mitral (valve) insufficiency: Secondary | ICD-10-CM | POA: Diagnosis not present

## 2017-08-08 DIAGNOSIS — L8990 Pressure ulcer of unspecified site, unspecified stage: Secondary | ICD-10-CM | POA: Insufficient documentation

## 2017-08-08 DIAGNOSIS — I110 Hypertensive heart disease with heart failure: Secondary | ICD-10-CM | POA: Diagnosis present

## 2017-08-08 DIAGNOSIS — A419 Sepsis, unspecified organism: Secondary | ICD-10-CM

## 2017-08-08 DIAGNOSIS — E871 Hypo-osmolality and hyponatremia: Secondary | ICD-10-CM | POA: Diagnosis not present

## 2017-08-08 DIAGNOSIS — D638 Anemia in other chronic diseases classified elsewhere: Secondary | ICD-10-CM | POA: Diagnosis present

## 2017-08-08 DIAGNOSIS — E1151 Type 2 diabetes mellitus with diabetic peripheral angiopathy without gangrene: Secondary | ICD-10-CM | POA: Diagnosis present

## 2017-08-08 DIAGNOSIS — A4159 Other Gram-negative sepsis: Secondary | ICD-10-CM | POA: Diagnosis present

## 2017-08-08 DIAGNOSIS — F419 Anxiety disorder, unspecified: Secondary | ICD-10-CM | POA: Diagnosis present

## 2017-08-08 DIAGNOSIS — E872 Acidosis: Secondary | ICD-10-CM | POA: Diagnosis present

## 2017-08-08 DIAGNOSIS — N136 Pyonephrosis: Secondary | ICD-10-CM | POA: Diagnosis present

## 2017-08-08 DIAGNOSIS — G35 Multiple sclerosis: Secondary | ICD-10-CM | POA: Diagnosis present

## 2017-08-08 DIAGNOSIS — D696 Thrombocytopenia, unspecified: Secondary | ICD-10-CM | POA: Diagnosis present

## 2017-08-08 DIAGNOSIS — J189 Pneumonia, unspecified organism: Secondary | ICD-10-CM | POA: Diagnosis present

## 2017-08-08 DIAGNOSIS — R197 Diarrhea, unspecified: Secondary | ICD-10-CM | POA: Diagnosis not present

## 2017-08-08 LAB — CBC WITH DIFFERENTIAL/PLATELET
Basophils Absolute: 0 10*3/uL (ref 0.0–0.1)
Basophils Relative: 0 %
EOS ABS: 0 10*3/uL (ref 0.0–0.7)
Eosinophils Relative: 0 %
HCT: 23.1 % — ABNORMAL LOW (ref 36.0–46.0)
Hemoglobin: 7.6 g/dL — ABNORMAL LOW (ref 12.0–15.0)
LYMPHS ABS: 0.9 10*3/uL (ref 0.7–4.0)
Lymphocytes Relative: 4 %
MCH: 24.6 pg — ABNORMAL LOW (ref 26.0–34.0)
MCHC: 32.9 g/dL (ref 30.0–36.0)
MCV: 74.8 fL — ABNORMAL LOW (ref 78.0–100.0)
MONO ABS: 0.7 10*3/uL (ref 0.1–1.0)
Monocytes Relative: 3 %
NEUTROS PCT: 93 %
Neutro Abs: 22 10*3/uL — ABNORMAL HIGH (ref 1.7–7.7)
PLATELETS: 265 10*3/uL (ref 150–400)
RBC: 3.09 MIL/uL — ABNORMAL LOW (ref 3.87–5.11)
RDW: 17.1 % — AB (ref 11.5–15.5)
WBC MORPHOLOGY: INCREASED
WBC: 23.6 10*3/uL — ABNORMAL HIGH (ref 4.0–10.5)

## 2017-08-08 LAB — BLOOD CULTURE ID PANEL (REFLEXED)
ACINETOBACTER BAUMANNII: NOT DETECTED
CANDIDA GLABRATA: NOT DETECTED
CANDIDA KRUSEI: NOT DETECTED
CANDIDA PARAPSILOSIS: NOT DETECTED
Candida albicans: NOT DETECTED
Candida tropicalis: NOT DETECTED
Carbapenem resistance: NOT DETECTED
ENTEROBACTERIACEAE SPECIES: DETECTED — AB
ESCHERICHIA COLI: NOT DETECTED
Enterobacter cloacae complex: NOT DETECTED
Enterococcus species: NOT DETECTED
Haemophilus influenzae: NOT DETECTED
KLEBSIELLA OXYTOCA: NOT DETECTED
KLEBSIELLA PNEUMONIAE: NOT DETECTED
Listeria monocytogenes: NOT DETECTED
Neisseria meningitidis: NOT DETECTED
PSEUDOMONAS AERUGINOSA: NOT DETECTED
Proteus species: DETECTED — AB
STAPHYLOCOCCUS AUREUS BCID: NOT DETECTED
STREPTOCOCCUS PNEUMONIAE: NOT DETECTED
STREPTOCOCCUS PYOGENES: NOT DETECTED
Serratia marcescens: NOT DETECTED
Staphylococcus species: NOT DETECTED
Streptococcus agalactiae: NOT DETECTED
Streptococcus species: NOT DETECTED

## 2017-08-08 LAB — CBC
HEMATOCRIT: 21.6 % — AB (ref 36.0–46.0)
HEMOGLOBIN: 7.4 g/dL — AB (ref 12.0–15.0)
MCH: 25.1 pg — AB (ref 26.0–34.0)
MCHC: 34.3 g/dL (ref 30.0–36.0)
MCV: 73.2 fL — AB (ref 78.0–100.0)
Platelets: 280 10*3/uL (ref 150–400)
RBC: 2.95 MIL/uL — ABNORMAL LOW (ref 3.87–5.11)
RDW: 17.3 % — ABNORMAL HIGH (ref 11.5–15.5)
WBC: 29 10*3/uL — ABNORMAL HIGH (ref 4.0–10.5)

## 2017-08-08 LAB — GLUCOSE, CAPILLARY
GLUCOSE-CAPILLARY: 311 mg/dL — AB (ref 65–99)
GLUCOSE-CAPILLARY: 141 mg/dL — AB (ref 65–99)
GLUCOSE-CAPILLARY: 176 mg/dL — AB (ref 65–99)
GLUCOSE-CAPILLARY: 212 mg/dL — AB (ref 65–99)
Glucose-Capillary: 308 mg/dL — ABNORMAL HIGH (ref 65–99)
Glucose-Capillary: 243 mg/dL — ABNORMAL HIGH (ref 65–99)
Glucose-Capillary: 162 mg/dL — ABNORMAL HIGH (ref 65–99)
Glucose-Capillary: 214 mg/dL — ABNORMAL HIGH (ref 65–99)
Glucose-Capillary: 206 mg/dL — ABNORMAL HIGH (ref 65–99)
Glucose-Capillary: 110 mg/dL — ABNORMAL HIGH (ref 65–99)

## 2017-08-08 LAB — GASTROINTESTINAL PANEL BY PCR, STOOL (REPLACES STOOL CULTURE)
ADENOVIRUS F40/41: NOT DETECTED
Astrovirus: NOT DETECTED
CRYPTOSPORIDIUM: NOT DETECTED
Campylobacter species: NOT DETECTED
Cyclospora cayetanensis: NOT DETECTED
ENTEROAGGREGATIVE E COLI (EAEC): NOT DETECTED
ENTEROPATHOGENIC E COLI (EPEC): NOT DETECTED
Entamoeba histolytica: NOT DETECTED
Enterotoxigenic E coli (ETEC): NOT DETECTED
GIARDIA LAMBLIA: NOT DETECTED
NOROVIRUS GI/GII: NOT DETECTED
Plesimonas shigelloides: NOT DETECTED
ROTAVIRUS A: NOT DETECTED
Salmonella species: NOT DETECTED
Sapovirus (I, II, IV, and V): NOT DETECTED
Shiga like toxin producing E coli (STEC): NOT DETECTED
Shigella/Enteroinvasive E coli (EIEC): NOT DETECTED
Vibrio cholerae: NOT DETECTED
Vibrio species: NOT DETECTED
YERSINIA ENTEROCOLITICA: NOT DETECTED

## 2017-08-08 LAB — BASIC METABOLIC PANEL
Anion gap: 15 (ref 5–15)
Anion gap: 12 (ref 5–15)
Anion gap: 12 (ref 5–15)
BUN: 42 mg/dL — AB (ref 6–20)
BUN: 35 mg/dL — AB (ref 6–20)
BUN: 35 mg/dL — AB (ref 6–20)
CALCIUM: 6.9 mg/dL — AB (ref 8.9–10.3)
CALCIUM: 7.7 mg/dL — AB (ref 8.9–10.3)
CHLORIDE: 102 mmol/L (ref 101–111)
CHLORIDE: 107 mmol/L (ref 101–111)
CHLORIDE: 106 mmol/L (ref 101–111)
CO2: 15 mmol/L — AB (ref 22–32)
CO2: 13 mmol/L — ABNORMAL LOW (ref 22–32)
CO2: 15 mmol/L — AB (ref 22–32)
CREATININE: 1.57 mg/dL — AB (ref 0.44–1.00)
Calcium: 6.8 mg/dL — ABNORMAL LOW (ref 8.9–10.3)
Creatinine, Ser: 1.16 mg/dL — ABNORMAL HIGH (ref 0.44–1.00)
Creatinine, Ser: 1.07 mg/dL — ABNORMAL HIGH (ref 0.44–1.00)
GFR calc non Af Amer: 34 mL/min — ABNORMAL LOW (ref >60)
GFR calc non Af Amer: 54 mL/min — ABNORMAL LOW (ref >60)
GFR, EST AFRICAN AMERICAN: 40 mL/min — AB (ref >60)
GFR, EST AFRICAN AMERICAN: 57 mL/min — AB (ref >60)
GFR, EST NON AFRICAN AMERICAN: 49 mL/min — AB (ref >60)
GLUCOSE: 335 mg/dL — AB (ref 65–99)
Glucose, Bld: 131 mg/dL — ABNORMAL HIGH (ref 65–99)
Glucose, Bld: 113 mg/dL — ABNORMAL HIGH (ref 65–99)
Potassium: 3.7 mmol/L (ref 3.5–5.1)
Potassium: 2.2 mmol/L — CL (ref 3.5–5.1)
Potassium: 3.1 mmol/L — ABNORMAL LOW (ref 3.5–5.1)
SODIUM: 132 mmol/L — AB (ref 135–145)
SODIUM: 133 mmol/L — AB (ref 135–145)
Sodium: 132 mmol/L — ABNORMAL LOW (ref 135–145)

## 2017-08-08 LAB — CBG MONITORING, ED
Glucose-Capillary: 303 mg/dL — ABNORMAL HIGH (ref 65–99)
Glucose-Capillary: 319 mg/dL — ABNORMAL HIGH (ref 65–99)

## 2017-08-08 LAB — COMPREHENSIVE METABOLIC PANEL
ALT: 27 U/L (ref 14–54)
ANION GAP: 17 — AB (ref 5–15)
AST: 69 U/L — ABNORMAL HIGH (ref 15–41)
Albumin: 2.3 g/dL — ABNORMAL LOW (ref 3.5–5.0)
Alkaline Phosphatase: 107 U/L (ref 38–126)
BUN: 46 mg/dL — ABNORMAL HIGH (ref 6–20)
CHLORIDE: 97 mmol/L — AB (ref 101–111)
CO2: 14 mmol/L — AB (ref 22–32)
Calcium: 7.6 mg/dL — ABNORMAL LOW (ref 8.9–10.3)
Creatinine, Ser: 1.95 mg/dL — ABNORMAL HIGH (ref 0.44–1.00)
GFR calc non Af Amer: 26 mL/min — ABNORMAL LOW (ref >60)
GFR, EST AFRICAN AMERICAN: 31 mL/min — AB (ref >60)
Glucose, Bld: 345 mg/dL — ABNORMAL HIGH (ref 65–99)
POTASSIUM: 4 mmol/L (ref 3.5–5.1)
SODIUM: 128 mmol/L — AB (ref 135–145)
Total Bilirubin: 1.3 mg/dL — ABNORMAL HIGH (ref 0.3–1.2)
Total Protein: 5.9 g/dL — ABNORMAL LOW (ref 6.5–8.1)

## 2017-08-08 LAB — PROTIME-INR
INR: 1.51
Prothrombin Time: 18.1 seconds — ABNORMAL HIGH (ref 11.4–15.2)

## 2017-08-08 LAB — LACTIC ACID, PLASMA
LACTIC ACID, VENOUS: 1.4 mmol/L (ref 0.5–1.9)
Lactic Acid, Venous: 2.5 mmol/L (ref 0.5–1.9)

## 2017-08-08 LAB — HIV ANTIBODY (ROUTINE TESTING W REFLEX): HIV Screen 4th Generation wRfx: NONREACTIVE

## 2017-08-08 LAB — MRSA PCR SCREENING: MRSA by PCR: POSITIVE — AB

## 2017-08-08 LAB — MAGNESIUM: Magnesium: 1.2 mg/dL — ABNORMAL LOW (ref 1.7–2.4)

## 2017-08-08 LAB — PROCALCITONIN: PROCALCITONIN: 137 ng/mL

## 2017-08-08 LAB — PHOSPHORUS: PHOSPHORUS: 2 mg/dL — AB (ref 2.5–4.6)

## 2017-08-08 MED ORDER — POTASSIUM PHOSPHATES 15 MMOLE/5ML IV SOLN
20.0000 meq | Freq: Once | INTRAVENOUS | Status: AC
  Administered 2017-08-08: 20 meq via INTRAVENOUS
  Filled 2017-08-08: qty 4.55

## 2017-08-08 MED ORDER — ACETAMINOPHEN 650 MG RE SUPP: 650.0000 mg | RECTAL | Status: AC | PRN

## 2017-08-08 MED ORDER — VANCOMYCIN HCL 10 G IV SOLR
1500.0000 mg | Freq: Once | INTRAVENOUS | Status: DC
  Filled 2017-08-08: qty 1500

## 2017-08-08 MED ORDER — POTASSIUM CHLORIDE CRYS ER 20 MEQ PO TBCR
40.0000 meq | EXTENDED_RELEASE_TABLET | Freq: Two times a day (BID) | ORAL | Status: DC
  Administered 2017-08-08 – 2017-08-15 (×12): 40 meq via ORAL
  Filled 2017-08-08 (×12): qty 2

## 2017-08-08 MED ORDER — SODIUM CHLORIDE 0.9 % IV SOLN
INTRAVENOUS | Status: AC
  Administered 2017-08-08: 2.5 [IU]/h via INTRAVENOUS
  Filled 2017-08-08: qty 1

## 2017-08-08 MED ORDER — VITAMINS A & D EX OINT
TOPICAL_OINTMENT | CUTANEOUS | Status: AC
  Administered 2017-08-08: 12:00:00
  Filled 2017-08-08: qty 5

## 2017-08-08 MED ORDER — SODIUM CHLORIDE 0.9 % IV SOLN
INTRAVENOUS | Status: DC
  Administered 2017-08-08: 02:00:00 via INTRAVENOUS

## 2017-08-08 MED ORDER — SODIUM CHLORIDE 0.9 % IV SOLN
2.0000 g | INTRAVENOUS | Status: DC
  Administered 2017-08-08 – 2017-08-09 (×2): 2 g via INTRAVENOUS
  Filled 2017-08-08: qty 20
  Filled 2017-08-08 (×2): qty 2

## 2017-08-08 MED ORDER — HEPARIN SODIUM (PORCINE) 5000 UNIT/ML IJ SOLN: 5000.0000 [IU] | Freq: Three times a day (TID) | INTRAMUSCULAR | Status: DC

## 2017-08-08 MED ORDER — NYSTATIN 100000 UNIT/GM EX POWD
Freq: Two times a day (BID) | CUTANEOUS | Status: DC
  Administered 2017-08-08 (×2): via TOPICAL
  Administered 2017-08-09: 1 via TOPICAL
  Administered 2017-08-09 – 2017-08-13 (×8): via TOPICAL
  Administered 2017-08-13: 1 via TOPICAL
  Administered 2017-08-14: 21:00:00 via TOPICAL
  Administered 2017-08-14: 1 via TOPICAL
  Administered 2017-08-15 – 2017-08-17 (×5): via TOPICAL
  Filled 2017-08-08: qty 15

## 2017-08-08 MED ORDER — PHENYLEPHRINE HCL-NACL 10-0.9 MG/250ML-% IV SOLN
20.0000 ug/min | INTRAVENOUS | Status: DC
  Filled 2017-08-08: qty 250

## 2017-08-08 MED ORDER — INSULIN GLARGINE 100 UNIT/ML ~~LOC~~ SOLN
10.0000 [IU] | Freq: Every day | SUBCUTANEOUS | Status: DC
  Administered 2017-08-08 – 2017-08-09 (×2): 10 [IU] via SUBCUTANEOUS
  Filled 2017-08-08 (×2): qty 0.1

## 2017-08-08 MED ORDER — INSULIN ASPART 100 UNIT/ML ~~LOC~~ SOLN
0.0000 [IU] | Freq: Three times a day (TID) | SUBCUTANEOUS | Status: DC
  Administered 2017-08-08: 7 [IU] via SUBCUTANEOUS
  Administered 2017-08-09: 15 [IU] via SUBCUTANEOUS
  Administered 2017-08-09: 4 [IU] via SUBCUTANEOUS
  Administered 2017-08-10: 3 [IU] via SUBCUTANEOUS
  Administered 2017-08-10: 11 [IU] via SUBCUTANEOUS
  Administered 2017-08-10: 4 [IU] via SUBCUTANEOUS
  Administered 2017-08-11: 11 [IU] via SUBCUTANEOUS
  Administered 2017-08-11 (×2): 7 [IU] via SUBCUTANEOUS
  Administered 2017-08-12: 4 [IU] via SUBCUTANEOUS
  Administered 2017-08-12: 3 [IU] via SUBCUTANEOUS
  Administered 2017-08-12: 7 [IU] via SUBCUTANEOUS
  Administered 2017-08-13 – 2017-08-14 (×2): 4 [IU] via SUBCUTANEOUS
  Administered 2017-08-14 – 2017-08-15 (×3): 3 [IU] via SUBCUTANEOUS

## 2017-08-08 MED ORDER — HYDROCODONE-ACETAMINOPHEN 7.5-325 MG PO TABS
1.0000 | ORAL_TABLET | ORAL | Status: DC | PRN
  Administered 2017-08-08: 2 via ORAL
  Administered 2017-08-09 – 2017-08-11 (×3): 1 via ORAL
  Administered 2017-08-11: 2 via ORAL
  Administered 2017-08-12: 1 via ORAL
  Administered 2017-08-12 – 2017-08-16 (×6): 2 via ORAL
  Filled 2017-08-08: qty 1
  Filled 2017-08-08 (×3): qty 2
  Filled 2017-08-08: qty 1
  Filled 2017-08-08 (×2): qty 2
  Filled 2017-08-08: qty 1
  Filled 2017-08-08: qty 2
  Filled 2017-08-08: qty 1
  Filled 2017-08-08 (×2): qty 2

## 2017-08-08 MED ORDER — ONDANSETRON HCL 4 MG/2ML IJ SOLN
4.0000 mg | Freq: Four times a day (QID) | INTRAMUSCULAR | Status: DC | PRN
  Administered 2017-08-09 – 2017-08-12 (×2): 4 mg via INTRAVENOUS
  Filled 2017-08-08 (×2): qty 2

## 2017-08-08 MED ORDER — FAMOTIDINE 20 MG PO TABS
20.0000 mg | ORAL_TABLET | Freq: Two times a day (BID) | ORAL | Status: DC
  Administered 2017-08-08 – 2017-08-17 (×19): 20 mg via ORAL
  Filled 2017-08-08 (×19): qty 1

## 2017-08-08 MED ORDER — SODIUM CHLORIDE 0.9 % IV SOLN: 250.0000 mL | INTRAVENOUS | Status: DC | PRN

## 2017-08-08 MED ORDER — NOREPINEPHRINE 4 MG/250ML-% IV SOLN
0.0000 ug/min | INTRAVENOUS | Status: DC
  Administered 2017-08-08: 2 ug/min via INTRAVENOUS
  Filled 2017-08-08: qty 250

## 2017-08-08 MED ORDER — INSULIN ASPART 100 UNIT/ML ~~LOC~~ SOLN: 2.0000 [IU] | SUBCUTANEOUS | Status: DC

## 2017-08-08 MED ORDER — STERILE WATER FOR INJECTION IV SOLN
Freq: Once | INTRAVENOUS | Status: AC
  Administered 2017-08-08: 18:00:00 via INTRAVENOUS
  Filled 2017-08-08: qty 850

## 2017-08-08 MED ORDER — HYDROCORTISONE NA SUCCINATE PF 100 MG IJ SOLR
50.0000 mg | Freq: Once | INTRAMUSCULAR | Status: AC
  Administered 2017-08-08: 50 mg via INTRAVENOUS
  Filled 2017-08-08: qty 2

## 2017-08-08 MED ORDER — SODIUM CHLORIDE 0.9 % IV SOLN: 20.0000 ug/min | INTRAVENOUS | Status: DC

## 2017-08-08 MED ORDER — ALPRAZOLAM 0.5 MG PO TABS: 0.5000 mg | ORAL_TABLET | Freq: Two times a day (BID) | ORAL | Status: DC | PRN

## 2017-08-08 MED ORDER — METHOCARBAMOL 500 MG PO TABS
250.0000 mg | ORAL_TABLET | Freq: Three times a day (TID) | ORAL | Status: DC | PRN
  Administered 2017-08-08 – 2017-08-10 (×5): 250 mg via ORAL
  Filled 2017-08-08 (×6): qty 1

## 2017-08-08 MED ORDER — SODIUM CHLORIDE 0.9 % IV BOLUS (SEPSIS)
500.0000 mL | Freq: Once | INTRAVENOUS | Status: AC
  Administered 2017-08-08: 500 mL via INTRAVENOUS

## 2017-08-08 MED ORDER — SODIUM CHLORIDE 0.9 % IV SOLN
INTRAVENOUS | Status: DC
  Administered 2017-08-09 (×2): via INTRAVENOUS

## 2017-08-08 MED ORDER — DEXTROSE 5 % IV SOLN: 0.0000 ug/min | Freq: Once | INTRAVENOUS | Status: DC

## 2017-08-08 MED ORDER — SODIUM CHLORIDE 0.9 % IV BOLUS (SEPSIS)
1000.0000 mL | Freq: Once | INTRAVENOUS | Status: AC
  Administered 2017-08-08: 1000 mL via INTRAVENOUS

## 2017-08-08 MED ORDER — MAGNESIUM SULFATE 2 GM/50ML IV SOLN
2.0000 g | Freq: Once | INTRAVENOUS | Status: AC
  Administered 2017-08-08: 2 g via INTRAVENOUS
  Filled 2017-08-08: qty 50

## 2017-08-08 MED ORDER — ACETAMINOPHEN 325 MG PO TABS
650.0000 mg | ORAL_TABLET | ORAL | Status: AC | PRN
  Administered 2017-08-08 (×2): 650 mg via ORAL
  Filled 2017-08-08 (×2): qty 2

## 2017-08-08 MED ORDER — INSULIN ASPART 100 UNIT/ML ~~LOC~~ SOLN
4.0000 [IU] | Freq: Three times a day (TID) | SUBCUTANEOUS | Status: DC
  Administered 2017-08-10 – 2017-08-17 (×16): 4 [IU] via SUBCUTANEOUS

## 2017-08-08 MED ORDER — MUPIROCIN 2 % EX OINT
1.0000 "application " | TOPICAL_OINTMENT | Freq: Two times a day (BID) | CUTANEOUS | Status: AC
  Administered 2017-08-08 – 2017-08-12 (×10): 1 via NASAL
  Filled 2017-08-08 (×2): qty 22

## 2017-08-08 MED ORDER — VANCOMYCIN HCL IN DEXTROSE 1-5 GM/200ML-% IV SOLN: 1000.0000 mg | INTRAVENOUS | Status: DC

## 2017-08-08 MED ORDER — APIXABAN 2.5 MG PO TABS
2.5000 mg | ORAL_TABLET | Freq: Two times a day (BID) | ORAL | Status: DC
  Administered 2017-08-08 – 2017-08-17 (×19): 2.5 mg via ORAL
  Filled 2017-08-08 (×20): qty 1

## 2017-08-08 MED ORDER — MEROPENEM 1 G IV SOLR
1.0000 g | Freq: Three times a day (TID) | INTRAVENOUS | Status: DC
Start: 2017-08-08 — End: 2017-08-08
  Administered 2017-08-08 (×2): 1 g via INTRAVENOUS
  Filled 2017-08-08 (×3): qty 1

## 2017-08-08 MED ORDER — CHLORHEXIDINE GLUCONATE CLOTH 2 % EX PADS
6.0000 | MEDICATED_PAD | Freq: Every day | CUTANEOUS | Status: AC
  Administered 2017-08-08 – 2017-08-12 (×5): 6 via TOPICAL

## 2017-08-08 NOTE — Progress Notes (Signed)
Pt currently resting at this time.  RT will defer Flutter valve until am, RT to monitor and assess as needed.

## 2017-08-08 NOTE — Progress Notes (Signed)
PHARMACY - PHYSICIAN COMMUNICATION CRITICAL VALUE ALERT - BLOOD CULTURE IDENTIFICATION (BCID)  Jocelyn Sanchez is an 62 y.o. female who presented to [facility] on 08/07/2017 with a chief complaint of shock  Assessment:  Pt admitted with sepsis   Name of physician (or Provider) Contacted: Noe Gens, NP  Current antibiotics: meropenem and vancomycin  Changes to prescribed antibiotics recommended:  Recommendations accepted by provider   - Change to Ceftriaxone 2 gr IV q24h   Results for orders placed or performed during the hospital encounter of 08/07/17  Blood Culture ID Panel (Reflexed) (Collected: 08/07/2017  9:57 PM)  Result Value Ref Range   Enterococcus species NOT DETECTED NOT DETECTED   Listeria monocytogenes NOT DETECTED NOT DETECTED   Staphylococcus species NOT DETECTED NOT DETECTED   Staphylococcus aureus NOT DETECTED NOT DETECTED   Streptococcus species NOT DETECTED NOT DETECTED   Streptococcus agalactiae NOT DETECTED NOT DETECTED   Streptococcus pneumoniae NOT DETECTED NOT DETECTED   Streptococcus pyogenes NOT DETECTED NOT DETECTED   Acinetobacter baumannii NOT DETECTED NOT DETECTED   Enterobacteriaceae species DETECTED (A) NOT DETECTED   Enterobacter cloacae complex NOT DETECTED NOT DETECTED   Escherichia coli NOT DETECTED NOT DETECTED   Klebsiella oxytoca NOT DETECTED NOT DETECTED   Klebsiella pneumoniae NOT DETECTED NOT DETECTED   Proteus species DETECTED (A) NOT DETECTED   Serratia marcescens NOT DETECTED NOT DETECTED   Carbapenem resistance NOT DETECTED NOT DETECTED   Haemophilus influenzae NOT DETECTED NOT DETECTED   Neisseria meningitidis NOT DETECTED NOT DETECTED   Pseudomonas aeruginosa NOT DETECTED NOT DETECTED   Candida albicans NOT DETECTED NOT DETECTED   Candida glabrata NOT DETECTED NOT DETECTED   Candida krusei NOT DETECTED NOT DETECTED   Candida parapsilosis NOT DETECTED NOT DETECTED   Candida tropicalis NOT DETECTED NOT DETECTED     Royetta Asal, PharmD, BCPS Pager [phone] 08/08/2017 2:36 PM

## 2017-08-08 NOTE — ED Notes (Signed)
ED TO INPATIENT HANDOFF REPORT  Name/Age/Gender Jocelyn Sanchez 62 y.o. female  Code Status    Code Status Orders  (From admission, onward)        Start     Ordered   08/08/17 0202  Limited resuscitation (code)  Continuous    Question Answer Comment  In the event of cardiac or respiratory ARREST: Initiate Code Blue, Call Rapid Response No   In the event of cardiac or respiratory ARREST: Perform CPR No   In the event of cardiac or respiratory ARREST: Perform Intubation/Mechanical Ventilation No   In the event of cardiac or respiratory ARREST: Use NIPPV/BiPAp only if indicated Yes   In the event of cardiac or respiratory ARREST: Administer ACLS medications if indicated Yes   In the event of cardiac or respiratory ARREST: Perform Defibrillation or Cardioversion if indicated No   Comments patient is okay with BIPAP and Vasopressors. No intubation or cpr      08/08/17 0202    Code Status History    Date Active Date Inactive Code Status Order ID Comments User Context   08/08/2017 01:19 08/08/2017 02:02 Full Code [id number]  Reyne Dumas, MD ED   07/12/2016 00:08 07/14/2016 22:00 Partial Code [id number]  Dixie Dials, MD ED   10/26/2015 21:10 10/31/2015 19:44 Full Code [id number]  Charolette Forward, MD Inpatient   05/10/2015 19:02 05/13/2015 17:29 Full Code [id number]  Dixie Dials, MD Inpatient   03/01/2015 21:29 03/12/2015 19:40 Full Code [id number]  Modena Jansky, MD Inpatient   11/28/2014 16:39 12/04/2014 16:31 Full Code [id number]  Charolette Forward, MD Inpatient   03/01/2012 13:54 03/11/2012 20:37 Full Code [id number]  Kayleen Memos, RN Inpatient   12/17/2011 15:57 12/23/2011 16:53 Full Code [id number]  Leanora Ivanoff, RN Inpatient   05/11/2011 03:03 05/13/2011 17:49 DNR [id number]  Linus Galas, RN Inpatient      Home/SNF/Other Home  Chief Complaint septic,hypoglycemia  Level of Care/Admitting Diagnosis ED Disposition    ED Disposition Condition [place]: [facility] [088110]  Level of Care: ICU [6]  Diagnosis: Septic shock [facility]) [[id number]]  Admitting Physician: Reyne Dumas [[id number]]  Attending Physician: Reyne Dumas [[id number]]  Estimated length of stay: 5 - 7 days  Certification:: I certify this patient will need inpatient services for at least 2 midnights  PT Class (Do Not Modify): Inpatient [101]  PT Acc Code (Do Not Modify): Private [1]       Medical History Past Medical History:  Diagnosis Date  . Anemia   . DM II (diabetes mellitus, type II), controlled ([place])   . Gait disorder   . HTN (hypertension)   . Lymphedema   . MS (multiple sclerosis) ([place])   . Pulmonary embolism (HCC)     Allergies Allergies  Allergen Reactions  . Sulfa Antibiotics Shortness Of Breath and Swelling    IV Location/Drains/Wounds Patient Lines/Drains/Airways Status   Active Line/Drains/Airways    Name:   Placement date:   Placement time:   Site:   Days:   Peripheral IV 08/07/17 Left Hand   08/07/17    2104    Hand   1   PICC Single Lumen 77/11/65 PICC Left Basilic 38 cm   79/03/83    3383    Basilic   291   Urethral Catheter julie edwards Latex 16 Fr.   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   10/27/15    -     651   Wound / Incision (Open or Dehisced) 10/30/15 Other (Comment) Abdomen Right;Lower brusise redden area with small stage 2   10/30/15    2200    Abdomen   648          Labs/Imaging Results for orders placed or performed during the hospital encounter of 08/07/17 (from the past 48 hour(s))  Urinalysis, Routine w reflex microscopic     Status: Abnormal   Collection Time: 08/07/17  9:24 PM  Result Value Ref Range   Color, Urine AMBER (A) YELLOW    Comment: BIOCHEMICALS MAY BE AFFECTED BY COLOR   APPearance TURBID (A) CLEAR   Specific Gravity, Urine 1.018  1.005 - 1.030   pH 5.0 5.0 - 8.0   Glucose, UA NEGATIVE NEGATIVE mg/dL   Hgb urine dipstick LARGE (A) NEGATIVE   Bilirubin Urine NEGATIVE NEGATIVE   Ketones, ur 5 (A) NEGATIVE mg/dL   Protein, ur >=300 (A) NEGATIVE mg/dL   Nitrite NEGATIVE NEGATIVE   Leukocytes, UA LARGE (A) NEGATIVE   RBC / HPF TOO NUMEROUS TO COUNT 0 - 5 RBC/hpf   WBC, UA TOO NUMEROUS TO COUNT 0 - 5 WBC/hpf   Bacteria, UA MANY (A) NONE SEEN   Squamous Epithelial / LPF 0-5 (A) NONE SEEN   WBC Clumps PRESENT     Comment: Performed at [facility], [place] [address], [place], [place] [address]  Brain natriuretic peptide     Status: None   Collection Time: 08/07/17  9:29 PM  Result Value Ref Range   B Natriuretic Peptide 96.8 0.0 - 100.0 pg/mL    Comment: Performed at [facility], [place] [address] [place], [place] [address]  Influenza panel by PCR (type A & B)     Status: None   Collection Time: 08/07/17  9:37 PM  Result Value Ref Range   Influenza A By PCR NEGATIVE NEGATIVE   Influenza B By PCR NEGATIVE NEGATIVE    Comment: (NOTE) The Xpert Xpress Flu assay is intended as an aid in the diagnosis of  influenza and should not be used as a sole basis for treatment.  This  assay is FDA approved for nasopharyngeal swab specimens only. Nasal  washings and aspirates are unacceptable for Xpert Xpress Flu testing. Performed at [facility], [place] [address] [place], [place] [address]   POC CBG, ED     Status: Abnormal   Collection Time: 08/07/17  9:41 PM  Result Value Ref Range   Glucose-Capillary 295 (H) 65 - 99 mg/dL  Blood Culture (routine x 2)     Status: None (Preliminary result)   Collection Time: 08/07/17  9:57 PM  Result Value Ref Range   Specimen Description      BLOOD RIGHT HAND Performed at [facility] [address] [place], [place] [address]    Special Requests      BOTTLES DRAWN AEROBIC AND ANAEROBIC Blood Culture adequate  volume   Culture PENDING    Report Status PENDING   Blood Culture (routine x 2)     Status: None (Preliminary result)   Collection Time: 08/07/17 10:48 PM  Result Value Ref Range   Specimen Description      BLOOD BLOOD RIGHT FOREARM Performed at [facility], [place] [address], [place], [place] [address]    Special Requests      BOTTLES DRAWN AEROBIC AND ANAEROBIC Blood Culture results may not be optimal due to an inadequate volume of blood received in culture bottles   Culture PENDING    Report Status PENDING   Blood gas, venous ([facility], [facility], [facility])     Status: Abnormal   Collection Time: 08/07/17 11:05 PM  Result Value Ref Range   O2 Content ROOM AIR L/min   Delivery systems ROOM AIR    pH, Ven 7.343 7.250 - 7.430   pCO2, Ven 32.1 (L) 44.0 - 60.0 mmHg   pO2, Ven  32.0 - 45.0 mmHg    CRITICAL RESULT CALLED TO, READ BACK BY AND VERIFIED WITH:    Comment: VALUE BELOW REPORTABLE RANGE Keymari Sato, RN AT 2312 BY ANNALISSA AGUSTIN,RRT,RCP ON 08/07/17    Bicarbonate 16.8 (L) 20.0 - 28.0 mmol/L   Acid-base deficit 7.4 (H) 0.0 - 2.0 mmol/L   O2 Saturation 45.1 %   Patient temperature 99.8    Collection site VEIN    Drawn by COLLECTED BY NURSE    Sample type VENOUS     Comment: Performed at [facility], [place] [address], [place], [place] [address]  I-stat troponin, ED (not at [facility], [facility])     Status: None   Collection Time: 08/07/17 11:09 PM  Result Value Ref Range   Troponin i, poc 0.01 0.00 - 0.08 ng/mL   Comment 3            Comment: Due to the release kinetics of cTnI, a negative result within the first hours of the onset of symptoms does not rule out myocardial infarction with certainty. If myocardial infarction is still suspected, repeat the test at appropriate intervals.   I-Stat Chem 8, ED     Status: Abnormal   Collection Time: 08/07/17 11:10 PM  Result Value Ref Range   Sodium 126 (L) 135 - 145 mmol/L   Potassium 4.0 3.5 - 5.1 mmol/L    Chloride 93 (L) 101 - 111 mmol/L   BUN 49 (H) 6 - 20 mg/dL   Creatinine, Ser 1.90 (H) 0.44 - 1.00 mg/dL   Glucose, Bld 342 (H) 65 - 99 mg/dL   Calcium, Ion 0.99 (L) 1.15 - 1.40 mmol/L   TCO2 18 (L) 22 - 32 mmol/L   Hemoglobin 9.9 (L) 12.0 - 15.0 g/dL   HCT 29.0 (L) 36.0 - 46.0 %  I-Stat CG4 Lactic Acid, ED  (not at  [facility])     Status: Abnormal   Collection Time: 08/07/17 11:12 PM  Result Value Ref Range   Lactic Acid, Venous 5.14 (HH) 0.5 - 1.9 mmol/L   Comment NOTIFIED PHYSICIAN   CBC with Differential/Platelet     Status: Abnormal   Collection Time: 08/07/17 11:28 PM  Result Value Ref Range   WBC 23.6 (H) 4.0 - 10.5 K/uL   RBC 3.09 (L) 3.87 - 5.11 MIL/uL   Hemoglobin 7.6 (L) 12.0 - 15.0 g/dL    Comment: DELTA CHECK NOTED REPEATED TO VERIFY    HCT 23.1 (L) 36.0 - 46.0 %   MCV 74.8 (L) 78.0 - 100.0 fL   MCH 24.6 (L) 26.0 - 34.0 pg   MCHC 32.9 30.0 - 36.0 g/dL   RDW 17.1 (H) 11.5 - 15.5 %   Platelets 265 150 - 400 K/uL   Neutrophils Relative % 93 %   Lymphocytes Relative 4 %   Monocytes  Relative 3 %   Eosinophils Relative 0 %   Basophils Relative 0 %   Neutro Abs 22.0 (H) 1.7 - 7.7 K/uL   Lymphs Abs 0.9 0.7 - 4.0 K/uL   Monocytes Absolute 0.7 0.1 - 1.0 K/uL   Eosinophils Absolute 0.0 0.0 - 0.7 K/uL   Basophils Absolute 0.0 0.0 - 0.1 K/uL   WBC Morphology INCREASED BANDS (>20% BANDS)     Comment: MILD LEFT SHIFT (1-5% METAS, OCC MYELO, OCC BANDS) VACUOLATED NEUTROPHILS Performed at [facility] [address] [place], [place] [address]   Comprehensive metabolic panel     Status: Abnormal   Collection Time: 08/07/17 11:28 PM  Result Value Ref Range   Sodium 128 (L) 135 - 145 mmol/L   Potassium 4.0 3.5 - 5.1 mmol/L   Chloride 97 (L) 101 - 111 mmol/L   CO2 14 (L) 22 - 32 mmol/L   Glucose, Bld 345 (H) 65 - 99 mg/dL   BUN 46 (H) 6 - 20 mg/dL   Creatinine, Ser 1.95 (H) 0.44 - 1.00 mg/dL   Calcium 7.6 (L) 8.9 - 10.3 mg/dL   Total Protein 5.9 (L) 6.5 -  8.1 g/dL   Albumin 2.3 (L) 3.5 - 5.0 g/dL   AST 69 (H) 15 - 41 U/L   ALT 27 14 - 54 U/L   Alkaline Phosphatase 107 38 - 126 U/L   Total Bilirubin 1.3 (H) 0.3 - 1.2 mg/dL   GFR calc non Af Amer 26 (L) >60 mL/min   GFR calc Af Amer 31 (L) >60 mL/min    Comment: (NOTE) The eGFR has been calculated using the CKD EPI equation. This calculation has not been validated in all clinical situations. eGFR's persistently <60 mL/min signify possible Chronic Kidney Disease.    Anion gap 17 (H) 5 - 15    Comment: Performed at [facility], [place] [address], [place], [place] [address]  Protime-INR     Status: Abnormal   Collection Time: 08/07/17 11:28 PM  Result Value Ref Range   Prothrombin Time 18.1 (H) 11.4 - 15.2 seconds   INR 1.51     Comment: Performed at [facility], [place] [address], [place], [place] [address]  Lactic acid, plasma     Status: Abnormal   Collection Time: 08/08/17 12:36 AM  Result Value Ref Range   Lactic Acid, Venous 2.5 (HH) 0.5 - 1.9 mmol/L    Comment: CRITICAL RESULT CALLED TO, READ BACK BY AND VERIFIED WITH: Perri Aragones,H RN 2.20.19 _0  ZANDO,C Performed at [facility], [place] [address], [place], [place] [address]   Procalcitonin - Baseline     Status: None   Collection Time: 08/08/17 12:41 AM  Result Value Ref Range   Procalcitonin 137.00 ng/mL    Comment:        Interpretation: PCT >= 10 ng/mL: Important systemic inflammatory response, almost exclusively due to severe bacterial sepsis or septic shock. (NOTE)       Sepsis PCT Algorithm           Lower Respiratory Tract                                      Infection PCT Algorithm    ----------------------------     ----------------------------         PCT < 0.25 ng/mL  PCT < 0.10 ng/mL         Strongly encourage             Strongly discourage   discontinuation of antibiotics    initiation of antibiotics    ----------------------------      -----------------------------       PCT 0.25 - 0.50 ng/mL            PCT 0.10 - 0.25 ng/mL               OR       >80% decrease in PCT            Discourage initiation of                                            antibiotics      Encourage discontinuation           of antibiotics    ----------------------------     -----------------------------         PCT >= 0.50 ng/mL              PCT 0.26 - 0.50 ng/mL                AND       <80% decrease in PCT             Encourage initiation of                                             antibiotics       Encourage continuation           of antibiotics    ----------------------------     -----------------------------        PCT >= 0.50 ng/mL                  PCT > 0.50 ng/mL               AND         increase in PCT                  Strongly encourage                                      initiation of antibiotics    Strongly encourage escalation           of antibiotics                                     -----------------------------                                           PCT <= 0.25 ng/mL                                                 OR                                        >  80% decrease in PCT                                     Discontinue / Do not initiate                                             antibiotics Performed at [facility] [address] [place], [place] [address]   CBG monitoring, ED     Status: Abnormal   Collection Time: 08/08/17  3:24 AM  Result Value Ref Range   Glucose-Capillary 303 (H) 65 - 99 mg/dL   Dg Chest Port 1 View  Result Date: 08/08/2017 CLINICAL DATA:  Central line placement. EXAM: PORTABLE CHEST 1 VIEW COMPARISON:  Chest radiograph August 08, 2017 FINDINGS: RIGHT internal jugular central venous catheter distal tip projects in distal superior vena cava. No pneumothorax. LEFT lung base alveolar airspace opacity with small LEFT pleural effusion. Fullness of the hilar  vascular shadows. Cardiac silhouette is normal. Apical pleural thickening. Soft tissue planes and included osseous structures are unchanged. IMPRESSION: Stable LEFT lung base airspace opacity concerning for pneumonia with small LEFT pleural effusion. RIGHT internal jugular central venous catheter distal tip projects in distal superior vena cava. No pneumothorax. Prominent pulmonary arteries associated with pulmonary arterial hypertension. Electronically Signed   By: Elon Alas M.D.   On: 08/08/2017 02:14   Dg Chest Port 1 View  Result Date: 08/08/2017 CLINICAL DATA:  Vomiting EXAM: PORTABLE CHEST 1 VIEW COMPARISON:  07/11/2016 FINDINGS: Small left pleural effusion and left basilar infiltrate. Borderline cardiomegaly. Aortic atherosclerosis. No pneumothorax. Right lung is clear. IMPRESSION: Left basilar airspace disease suspicious for pneumonia with probable small left pleural effusion. Electronically Signed   By: Donavan Foil M.D.   On: 08/08/2017 00:27    Pending Labs Unresulted Labs (From admission, onward)   Start     Ordered   08/09/17 0500  Procalcitonin  Daily,   R     08/08/17 0040   08/08/17 0500  CBC  Tomorrow morning,   R     08/08/17 0118   08/08/17 5537  Basic metabolic panel  Tomorrow morning,   R     08/08/17 0118   08/08/17 0500  Magnesium  Tomorrow morning,   R     08/08/17 0118   08/08/17 0500  Phosphorus  Tomorrow morning,   R     08/08/17 0118   08/08/17 0115  HIV antibody (Routine Testing)  Once,   R     08/08/17 0118   08/08/17 0041  Culture, expectorated sputum-assessment  Once,   R     08/08/17 0040   08/08/17 0041  Lactic acid, plasma  STAT Now then every 3 hours,   R     08/08/17 0040   08/08/17 0040  MRSA PCR Screening  STAT,   R     08/08/17 0040   08/08/17 0009  Occult blood card to lab, stool  Once,   STAT     08/08/17 0008   08/07/17 2137  Gastrointestinal Panel by PCR , Stool  (Gastrointestinal Panel by PCR, Stool)  Once,   R     08/07/17 2136    08/07/17 2129  Urine culture  STAT,   STAT     08/07/17 2129  08/07/17 2124  CBC WITH DIFFERENTIAL  STAT,   STAT     08/07/17 2129      Vitals/Pain Today's Vitals   08/08/17 0230 08/08/17 0245 08/08/17 0300 08/08/17 0330  BP: 94/60 (!) 99/52 (!) 89/54 (!) 94/54  Pulse: 100 100 96 96  Resp: _0 Temp:      TempSrc:      SpO2: 100% 98% 98% 100%  Weight:      Height:      PainSc:        Isolation Precautions Enteric precautions (UV disinfection)  Medications Medications  norepinephrine (LEVOPHED) 40m in D5W 2536mpremix infusion (3 mcg/min Intravenous Rate/Dose Change 08/08/17 0316)  insulin aspart (novoLOG) injection 2-6 Units (not administered)  acetaminophen (TYLENOL) suppository 650 mg (not administered)    Or  acetaminophen (TYLENOL) tablet 650 mg (not administered)  meropenem (MERREM) 1 g in sodium chloride 0.9 % 100 mL IVPB (0 g Intravenous Stopped 08/08/17 0316)  vancomycin (VANCOCIN) IVPB 1000 mg/200 mL premix (not administered)  0.9 %  sodium chloride infusion (not administered)  famotidine (PEPCID) tablet 20 mg (not administered)  0.9 %  sodium chloride infusion ( Intravenous New Bag/Given 08/08/17 0130)  ondansetron (ZOFRAN) injection 4 mg (not administered)  apixaban (ELIQUIS) tablet 2.5 mg (not administered)  sodium chloride 0.9 % bolus 1,000 mL (0 mLs Intravenous Stopped 08/07/17 2331)    And  sodium chloride 0.9 % bolus 1,000 mL (0 mLs Intravenous Stopped 08/07/17 2331)  piperacillin-tazobactam (ZOSYN) IVPB 3.375 g (0 g Intravenous Stopped 08/07/17 2224)  vancomycin (VANCOCIN) IVPB 1000 mg/200 mL premix (0 mg Intravenous Stopped 08/07/17 2331)  acetaminophen (TYLENOL) tablet 1,000 mg (1,000 mg Oral Given 08/07/17 2221)  sodium chloride 0.9 % bolus 1,000 mL (0 mLs Intravenous Stopped 08/08/17 0316)  sodium chloride 0.9 % bolus 1,000 mL (0 mLs Intravenous Stopped 08/08/17 0316)  hydrocortisone sodium succinate (SOLU-CORTEF) 100 MG injection 50 mg (50 mg  Intravenous Given 08/08/17 0025)    Mobility non-ambulatory

## 2017-08-08 NOTE — Progress Notes (Addendum)
BMP reviewed > hypokalemia, hyponatremia, decreased CO2.  Suspect GI loss with prior diarrhea (2 episodes 2/20, GI PCR negative) / non-gap on BMP.    Plan: Sodium bicarbonate 3 amps in 1L sterile water x1 bag @ 24ml/hr Once bicarb complete, transition to NS @ 75 (or KVO IVF if taking adequate PO's) KCL 40 mEq PO BID x2  Follow up BMP at 2000 & in am   Noe Gens, NP-C [facility] Pgr: [phone] or if no answer [phone] 08/08/2017, 4:46 PM

## 2017-08-08 NOTE — Progress Notes (Signed)
Pharmacy Antibiotic Note  Jocelyn Sanchez is a 62 y.o. female admitted on 08/07/2017 with sepsis.  Pharmacy has been consulted for meropenem and vancomycin dosing.  Plan: Meropenem 1 Gm IV q8h Vancomycin 1 Gm q48h for est AUC=459 Goal AUC=400-500 F/u scr/cultures/levels  Height: 5\' 3"  (160 cm) Weight: 148 lb (67.1 kg) IBW/kg (Calculated) : 52.4  Temp (24hrs), Avg:103.9 F (39.9 C), Min:103.9 F (39.9 C), Max:103.9 F (39.9 C)  Recent Labs  Lab 08/07/17 2310 08/07/17 2312 08/07/17 2328  WBC  --   --  23.6*  CREATININE 1.90*  --  1.95*  LATICACIDVEN  --  5.14*  --     Estimated Creatinine Clearance: 27.5 mL/min (A) (by C-G formula based on SCr of 1.95 mg/dL (H)).    Allergies  Allergen Reactions  . Sulfa Antibiotics Shortness Of Breath and Swelling    Antimicrobials this admission: 2/20 meropenem >>  2/20 vancomycin >>   Dose adjustments this admission:   Microbiology results:  BCx:   UCx:    Sputum:    MRSA PCR:   Thank you for allowing pharmacy to be a part of this patient's care.  Dorrene German 08/08/2017 1:04 AM

## 2017-08-08 NOTE — Care Management Note (Signed)
Case Management Note  Patient Details  Name: Jocelyn Sanchez MRN: [id number] Date of Birth: 02-25-1956  Subjective/Objective:61 y/o f admitted w/Septic shock. From home-active w/[facility]-RN/PT/OT rep [place] following. Patient w/concerns about her dme-has hospital bed,wants another type of mattress-[facility] has dme. Patient has already started process for her w/c w/pcp office. Has CAPS services(custodial level care)-[facility].Patient states she is w/c bound.will need [facility] ambulance transp @ d/c.                   Action/Plan:d/c plan home w/HHC.   Expected Discharge Date:  (unknown)               Expected Discharge Plan:  [place]  In-House Referral:     Discharge planning Services  CM Consult  Post Acute Care Choice:  Durable Medical Equipment, Home Health(hospital bed. [facility]/PT/OT) Choice offered to:     DME Arranged:    DME Agency:     HH Arranged:    HH Agency:     Status of Service:  In process, will continue to follow  If discussed at Long Length of Stay Meetings, dates discussed:    Additional Comments:  [name], RN 08/08/2017, 4:15 PM

## 2017-08-08 NOTE — Progress Notes (Signed)
[facility] AM Follow Up - See H&P from 2/20     S: Pt reports feeling better.  Asking for something to eat/drink, vaseline for lips, cream for heels, powder under folds & muscle relaxer.  Remains on levophed 24mcg.     O: Blood pressure (!) 122/56, pulse 85, temperature 97.6 F (36.4 C), temperature source Axillary, resp. rate 15, height 5\' 3"  (1.6 m), weight 170 lb 10.2 oz (77.4 kg), SpO2 99 %.   General:  Obese female in NAD lying in bed  HEENT: MM pink/moist PSY: calm/appropriate  Neuro: AAOx4, speech clear, MAE  CV: s1s2 rrr, no m/r/g PULM: even/non-labored, lungs bilaterally distant but clear, diminished bases  EN:IDPO, non-tender, bsx4 active  Extremities: warm/dry, trace LE edema  Skin: no rashes or lesions  Recent Labs  Lab 08/07/17 2310 08/07/17 2328 08/08/17 0523  HGB 9.9* 7.6* 7.4*  HCT 29.0* 23.1* 21.6*  WBC  --  23.6* 29.0*  PLT  --  265 280   Recent Labs  Lab 08/07/17 2310 08/07/17 2328 08/08/17 0523  NA 126* 128* 132*  K 4.0 4.0 3.7  CL 93* 97* 102  CO2  --  14* 15*  GLUCOSE 342* 345* 335*  BUN 49* 46* 42*  CREATININE 1.90* 1.95* 1.57*  CALCIUM  --  7.6* 6.8*  MG  --   --  1.2*  PHOS  --   --  2.0*     A: MS - bedridden at baseline  Sepsis - urine, PNA, GI as potential sources Chronic Systolic CHF - LVEF 24-23% HTN AKI  Hyponatremia  Hx PE on Anticoagulation Anemia  Nausea / Vomiting  Diarrhea  DM   P: See H&P from 1230 am for full plan of care Await cultures  Continue Meropenem + Vanco for now  NS @ 75 ml/hr > will need to decrease once off levophed Lactate cleared, 5.1 > 1.4 Follow CBC, BMP Replace Phos, Mg Continue levohped for MAP > 65 PRN tylenol for fever  Await GI PCR Continue anticoagulation  Continue insulin gtt  LCB noted > no CPR, no intubation    Noe Gens, NP-C  Pulmonary & Critical Care Pgr: [phone] or if no answer [phone] 08/08/2017, 8:42 AM

## 2017-08-08 NOTE — Consult Note (Signed)
[facility] Nurse wound consult note Reason for Consult:chronic, healing previously stage IV pressure injury Wound type:pressure Pressure Injury POA: Yes Measurement: 2.5cm x 2.5cm x 0.1cm with undermining Wound bed: Drainage (amount, consistency, odor) scant Periwound: healing scar tissue Dressing procedure/placement/frequency: To sacral/buttock wound cleanse with NS, pat dry, place NS moistened gauze on wound, cover with dry gauze, adhere where fecal material does not get into dressing, change BID and prn soiling. Keep pt turned side to side. Poor nutritional state, recommend Nutritional consult, please order if you agree. When pt transfers to regular floor will need a low air loss mattress. We will not follow, but will remain available to this patient, to nursing, and the medical and/or surgical teams.  Please re-consult if we need to assist further.   Fara Olden, RN-C, WTA-C [facility]

## 2017-08-08 NOTE — Progress Notes (Signed)
Pt gives permission to ask questions on nursing admission history in front of visitors. Lucius Conn BSN, RN-BC Admissions RN 08/08/2017 2:54 PM

## 2017-08-08 NOTE — Progress Notes (Signed)
Attempted to do echo, however unable due to elevated HR ( 130 +bpm)

## 2017-08-08 NOTE — H&P (Signed)
PULMONARY / CRITICAL CARE MEDICINE   Name: Jocelyn Sanchez MRN: [id number] DOB: 1955/08/11    ADMISSION DATE:  08/07/2017 CONSULTATION DATE:  08/08/2017  REFERRING MD:  Dr. Leonette Monarch EDP  CHIEF COMPLAINT:  Shock  HISTORY OF PRESENT ILLNESS:   62 year old female with PMH as below, which is significant for MS causing her to be bedridden, DM, HTN, and PE. She presented to [facility] long ED with complaints of nausea, vomiting, and diarrhea x 3 days. Upon arrival to the ED she was noted to be fabrile, tachycardic, and hypotensive. Volume resuscitation was initiated. She was felt to be in septic shock and started on broad spectrum antibiotics. Despite 30cc/kg IVF she remained hypotensive and was given additional IVF. Eventually she was started on vasopressors for persistent hypotension. Lactic acid elevated to 5.14. PCCM asked to admit.   On my exam patient said she started having light brown watery diarrhea on Sunday 2/17 as well as a few episodes of N/V as well as dyspnea only when talking a lot. She said none of those were normal for her. Denies F/C, Cough, Wheezing, CP, Abd pain, Dysuria, or blood in stool. She denies any recent med changes. She often takes stool soften but hadn't taken any in 1 week at the time of the diarrhea. At time of my exam she is smiling in bed and says that she is hungry. She wants to know if she will be ready to go home tomorrow.   PAST MEDICAL HISTORY :  She  has a past medical history of Anemia, DM II (diabetes mellitus, type II), controlled ([place]), Gait disorder, HTN (hypertension), Lymphedema, MS (multiple sclerosis) ([place]), and Pulmonary embolism ([place]).  PAST SURGICAL HISTORY: She  has a past surgical history that includes Flexible sigmoidoscopy (Left, 03/09/2015) and Esophagogastroduodenoscopy (N/A, 07/12/2016).  Allergies  Allergen Reactions  . Sulfa Antibiotics Shortness Of Breath and Swelling    No current facility-administered medications on file prior to  encounter.    Current Outpatient Medications on File Prior to Encounter  Medication Sig  . ALPRAZolam (XANAX) 0.5 MG tablet Take 0.5 mg by mouth 2 (two) times daily as needed for anxiety.  Marland Kitchen apixaban (ELIQUIS) 2.5 MG TABS tablet Take 1 tablet (2.5 mg total) by mouth 2 (two) times daily.  . carvedilol (COREG) 3.125 MG tablet Take 3.125 mg by mouth 2 (two) times daily.  . furosemide (LASIX) 20 MG tablet Take 20 mg by mouth daily. On Monday, Wednesday and Friday  . HYDROcodone-acetaminophen (NORCO/VICODIN) 5-325 MG per tablet Take 2 tablets by mouth every 4 (four) hours as needed for moderate pain.  . methocarbamol (ROBAXIN) 500 MG tablet Take 0.5 tablets (250 mg total) by mouth every 8 (eight) hours as needed for muscle spasms.  . pantoprazole (PROTONIX) 40 MG tablet Take 1 tablet (40 mg total) by mouth 2 (two) times daily.  . potassium chloride (K-DUR,KLOR-CON) 10 MEQ tablet Take 10 mEq by mouth daily. On Monday, Wednesday and Friday  . promethazine (PHENERGAN) 25 MG tablet Take 25 mg by mouth every 6 (six) hours as needed for nausea or vomiting.  . sacubitril-valsartan (ENTRESTO) 24-26 MG Take 1 tablet by mouth 2 (two) times daily.  Marland Kitchen acetaminophen (TYLENOL) 325 MG tablet Take 2 tablets (650 mg total) by mouth every 6 (six) hours as needed for mild pain (or Fever >/= 101). (Patient not taking: Reported on 08/07/2017)  . cephALEXin (KEFLEX) 500 MG capsule Take 1 capsule (500 mg total) by mouth 3 (three) times daily. (Patient  not taking: Reported on 08/07/2017)  . ciprofloxacin (CIPRO) 250 MG tablet Take 1 tablet (250 mg total) by mouth 2 (two) times daily. (Patient not taking: Reported on 08/07/2017)  . docusate sodium (COLACE) 100 MG capsule Take 1 capsule (100 mg total) by mouth 2 (two) times daily. (Patient not taking: Reported on 08/07/2017)  . glipiZIDE (GLUCOTROL) 5 MG tablet Take 1 tablet (5 mg total) by mouth 2 (two) times daily. (Patient not taking: Reported on 08/07/2017)  . zolpidem (AMBIEN)  5 MG tablet Take 1 tablet (5 mg total) by mouth at bedtime as needed for sleep (insomnia). (Patient not taking: Reported on 08/07/2017)    FAMILY HISTORY:  Her indicated that her mother is alive. She indicated that her father is deceased.   SOCIAL HISTORY: She  reports that she has quit smoking. Her smoking use included cigarettes. she has never used smokeless tobacco. She reports that she does not drink alcohol or use drugs.  REVIEW OF SYSTEMS:   Review of Systems  Constitutional: Negative.   HENT: Negative.   Eyes: Negative.   Respiratory: Positive for shortness of breath.   Cardiovascular: Negative.   Gastrointestinal: Positive for diarrhea, nausea and vomiting.  Genitourinary: Negative.   Musculoskeletal: Negative.   Skin: Negative.   Neurological: Negative.   Endo/Heme/Allergies: Negative.   Psychiatric/Behavioral: Negative.    SUBJECTIVE:  Lying on ER stretcher, having just had central line placed. She is smiling and says she feels hungry.   VITAL SIGNS: BP (!) 85/43   Pulse (!) 104   Temp (!) 103.9 F (39.9 C) (Rectal)   Resp 20   SpO2 98%   HEMODYNAMICS:  not on pressors yet at time of my exam  INTAKE / OUTPUT: No intake/output data recorded.  PHYSICAL EXAMINATION: General: Elderly female, appears older than stated age, lying on ER stretcher in NAD Neuro: Awake/alert, obeying commands and answering questions HEENT: MM dry, OP clear Cardiovascular: Tachycardic with a regular rhythm, no m/r/g Lungs: CTA b/l, no respiratory distress, speaking in full sentences Abdomen: Soft, NTND Musculoskeletal:  Trace BLE edema Skin: no rashes   LABS:  BMET Recent Labs  Lab 08/07/17 2310 08/07/17 2328  NA 126* 128*  K 4.0 4.0  CL 93* 97*  CO2  --  14*  BUN 49* 46*  CREATININE 1.90* 1.95*  GLUCOSE 342* 345*   Electrolytes Recent Labs  Lab 08/07/17 2328  CALCIUM 7.6*   CBC Recent Labs  Lab 08/07/17 2310 08/07/17 2328  WBC  --  23.6*  HGB 9.9* 7.6*   HCT 29.0* 23.1*  PLT  --  265    Coag's Recent Labs  Lab 08/07/17 2328  INR 1.51   Sepsis Markers Recent Labs  Lab 08/07/17 2312  LATICACIDVEN 5.14*   ABG No results for input(s): PHART, PCO2ART, PO2ART in the last 168 hours.  Liver Enzymes Recent Labs  Lab 08/07/17 2328  AST 69*  ALT 27  ALKPHOS 107  BILITOT 1.3*  ALBUMIN 2.3*   Cardiac Enzymes No results for input(s): TROPONINI, PROBNP in the last 168 hours.  Glucose Recent Labs  Lab 08/07/17 2141  GLUCAP 295*   Imaging Dg Chest Port 1 View  Result Date: 08/08/2017 CLINICAL DATA:  Vomiting EXAM: PORTABLE CHEST 1 VIEW COMPARISON:  07/11/2016 FINDINGS: Small left pleural effusion and left basilar infiltrate. Borderline cardiomegaly. Aortic atherosclerosis. No pneumothorax. Right lung is clear. IMPRESSION: Left basilar airspace disease suspicious for pneumonia with probable small left pleural effusion. Electronically Signed   By: Maudie Mercury  Francoise Ceo M.D.   On: 08/08/2017 00:27   CULTURES: Blood cultures 2/19 > GI panel 2/19 > Urine 2/19 > Sputum 2/19 >  ANTIBIOTICS: Zosyn 2/19  Vancomycin 2/19 >> Meropenem 2/19 >>  SIGNIFICANT EVENTS: 2/19 > Admit for septic shock  LINES/TUBES: PIV's RIJ TLC 2/20 >>   DISCUSSION: 62 year old female with severe MS presented with a 3 day history of nausea, vomiting, and diarrhea admitted 2/19 and was admitted with presumed septic shock. Failed volume resuscitation and was started on pressors. Admitted to ICU.   ASSESSMENT / PLAN:  PULMONARY A: Pneumonia P:   - start IS and Flutter valve - antibiotics as below  CARDIOVASCULAR A:  Septic shock, likely a component of hypovolemic shock as well.  Chronic systolic CHF (LVEF 16-10% as of 2017) H/o HTN P:  - septic shock addressed below - the TTE in 10/2015 with EF 20% was in the setting of acute PE with normal EF prior to that; this was likely due to stunned myocardium and is now normal. Will repeat TTE this admission.   - hold home antihypertensive medications given the current shock  RENAL A:   AKI Hyponatremia AG metabolic acidosis (lactic) P:   - creatinine up to 1.95 from baseline of 0.30 (in 06/2016) - order renal ultrasound; continue IVF's. Place foley. - hyponatremia likely due to the pneumonia; continue to monitor.  - trend lactate  GASTROINTESTINAL A:   Nausea/vomiting/diarrhea P:   - unclear etiology; Stool culture and Cdiff pending - Zofran PRN  HEMATOLOGIC A:   Hx PE Hx Anemia P:  - continue Eliquis - Hgb 7.6 from baseline Hgb in 8's; continue to monitor  INFECTIOUS A:   Septic shock secondary to CAP vs UTI. Also concern for GI infection given the diarrhea P:   - s/p 3L IVF bolus; continue NS gtt - starting vasopressor as BP still low after IVF boluses - UA consistent with UTI - CXR on my review shows a LLL infiltrate - history of ESBL Ecoli UTI as well as MRSA; received Vanc and Zosyn in ER. Will change to Vanc and Meropenem - blood cultures and urine culture pending; order sputum culture. Check procalcitonin. Trend lactate. Flu PCR negative. Cdiff PCR pending.  - Trend WBC and fever curves; continue APAP PRN; start cooling blanket.  - initiated contact precautions  ENDOCRINE A:   DM  P:   CBG monitoring and SSI  NEUROLOGIC A:   Hx Multiple Sclerosis P:  - continue to monitor   FAMILY  - Updates: no family present in Er at time of my exam - Inter-disciplinary family meet or Palliative Care meeting due by: 08/14/17  60 minutes critical care time  Vernie Murders, MD  Pulmonary and [place] Pager: [phone]  08/08/2017, 12:31 AM

## 2017-08-08 NOTE — Progress Notes (Signed)
ANTICOAGULATION CONSULT NOTE - Initial Consult  Pharmacy Consult for Apixaban Indication: Hx of PE (5/17)  Allergies  Allergen Reactions  . Sulfa Antibiotics Shortness Of Breath and Swelling    Patient Measurements: Height: 5\' 3"  (160 cm) Weight: 148 lb (67.1 kg) IBW/kg (Calculated) : 52.4   Vital Signs: Temp: 103.9 F (39.9 C) (02/19 2107) Temp Source: Rectal (02/19 2107) BP: 79/66 (02/20 0145) Pulse Rate: 102 (02/20 0145)  Labs: Recent Labs    08/07/17 2310 08/07/17 2328  HGB 9.9* 7.6*  HCT 29.0* 23.1*  PLT  --  265  LABPROT  --  18.1*  INR  --  1.51  CREATININE 1.90* 1.95*    Estimated Creatinine Clearance: 27.5 mL/min (A) (by C-G formula based on SCr of 1.95 mg/dL (H)).   Medical History: Past Medical History:  Diagnosis Date  . Anemia   . DM II (diabetes mellitus, type II), controlled ([place])   . Gait disorder   . HTN (hypertension)   . Lymphedema   . MS (multiple sclerosis) ([place])   . Pulmonary embolism ([facility])     Medications:  Scheduled:  . famotidine  20 mg Oral BID  . heparin  5,000 Units Subcutaneous Q8H  . insulin aspart  2-6 Units Subcutaneous Q4H   Infusions:  . sodium chloride    . sodium chloride    . meropenem (MERREM) IV 1 g (08/08/17 0203)  . norepinephrine 2 mcg/min (08/08/17 0158)  . vancomycin      Assessment: 67 yoFwith hx of DVT/PE May of 2017. Patient was on full dose apixaban until 1/18 when admitted with acute upper GI bleed. At discharge Apixaban was resumed at 2.5 mg bid for prophylaxis against VTE recurrence.  Goal of Therapy:  Prevent VTE recurrence    Plan:  Apixaban 2.5 mg bid   Dorrene German 08/08/2017,2:24 AM

## 2017-08-09 ENCOUNTER — Inpatient Hospital Stay (HOSPITAL_COMMUNITY): Payer: Medicare Other

## 2017-08-09 DIAGNOSIS — I340 Nonrheumatic mitral (valve) insufficiency: Secondary | ICD-10-CM

## 2017-08-09 LAB — URINE CULTURE

## 2017-08-09 LAB — BASIC METABOLIC PANEL
ANION GAP: 16 — AB (ref 5–15)
Anion gap: 9 (ref 5–15)
BUN: 32 mg/dL — ABNORMAL HIGH (ref 6–20)
BUN: 26 mg/dL — AB (ref 6–20)
CHLORIDE: 101 mmol/L (ref 101–111)
CHLORIDE: 108 mmol/L (ref 101–111)
CO2: 13 mmol/L — AB (ref 22–32)
CO2: 15 mmol/L — ABNORMAL LOW (ref 22–32)
CREATININE: 0.84 mg/dL (ref 0.44–1.00)
Calcium: 7.1 mg/dL — ABNORMAL LOW (ref 8.9–10.3)
Calcium: 7.3 mg/dL — ABNORMAL LOW (ref 8.9–10.3)
Creatinine, Ser: 1.01 mg/dL — ABNORMAL HIGH (ref 0.44–1.00)
GFR calc Af Amer: >60 mL/min (ref >60)
GFR calc Af Amer: >60 mL/min (ref >60)
GFR calc non Af Amer: 58 mL/min — ABNORMAL LOW (ref >60)
GFR calc non Af Amer: >60 mL/min (ref >60)
GLUCOSE: 319 mg/dL — AB (ref 65–99)
GLUCOSE: 137 mg/dL — AB (ref 65–99)
POTASSIUM: 2.9 mmol/L — AB (ref 3.5–5.1)
Potassium: 3.6 mmol/L (ref 3.5–5.1)
Sodium: 130 mmol/L — ABNORMAL LOW (ref 135–145)
Sodium: 132 mmol/L — ABNORMAL LOW (ref 135–145)

## 2017-08-09 LAB — GLUCOSE, CAPILLARY
GLUCOSE-CAPILLARY: 85 mg/dL (ref 65–99)
Glucose-Capillary: 118 mg/dL — ABNORMAL HIGH (ref 65–99)
Glucose-Capillary: 240 mg/dL — ABNORMAL HIGH (ref 65–99)
Glucose-Capillary: 303 mg/dL — ABNORMAL HIGH (ref 65–99)
Glucose-Capillary: 173 mg/dL — ABNORMAL HIGH (ref 65–99)

## 2017-08-09 LAB — MAGNESIUM: Magnesium: 1.2 mg/dL — ABNORMAL LOW (ref 1.7–2.4)

## 2017-08-09 LAB — CBC
HCT: 22.3 % — ABNORMAL LOW (ref 36.0–46.0)
HEMOGLOBIN: 7.5 g/dL — AB (ref 12.0–15.0)
MCH: 24.4 pg — AB (ref 26.0–34.0)
MCHC: 33.6 g/dL (ref 30.0–36.0)
MCV: 72.6 fL — AB (ref 78.0–100.0)
Platelets: 198 10*3/uL (ref 150–400)
RBC: 3.07 MIL/uL — AB (ref 3.87–5.11)
RDW: 17.4 % — ABNORMAL HIGH (ref 11.5–15.5)
WBC: 36.2 10*3/uL — ABNORMAL HIGH (ref 4.0–10.5)

## 2017-08-09 LAB — ECHOCARDIOGRAM COMPLETE
HEIGHTINCHES: 63 in
WEIGHTICAEL: 2984.15 [oz_av]

## 2017-08-09 LAB — PROCALCITONIN: Procalcitonin: 75.6 ng/mL

## 2017-08-09 MED ORDER — MAGNESIUM SULFATE 2 GM/50ML IV SOLN
2.0000 g | Freq: Once | INTRAVENOUS | Status: AC
  Administered 2017-08-09: 2 g via INTRAVENOUS
  Filled 2017-08-09: qty 50

## 2017-08-09 MED ORDER — POTASSIUM CHLORIDE CRYS ER 20 MEQ PO TBCR
40.0000 meq | EXTENDED_RELEASE_TABLET | ORAL | Status: AC
  Administered 2017-08-09 (×2): 40 meq via ORAL
  Filled 2017-08-09 (×2): qty 2

## 2017-08-09 MED ORDER — NOREPINEPHRINE BITARTRATE 1 MG/ML IV SOLN: 0.0000 ug/min | INTRAVENOUS | Status: DC

## 2017-08-09 MED ORDER — INSULIN GLARGINE 100 UNIT/ML ~~LOC~~ SOLN
20.0000 [IU] | Freq: Every day | SUBCUTANEOUS | Status: DC
  Administered 2017-08-10 – 2017-08-12 (×3): 20 [IU] via SUBCUTANEOUS
  Administered 2017-08-14: 10 [IU] via SUBCUTANEOUS
  Administered 2017-08-15: 20 [IU] via SUBCUTANEOUS
  Filled 2017-08-09 (×6): qty 0.2

## 2017-08-09 MED ORDER — INSULIN GLARGINE 100 UNIT/ML ~~LOC~~ SOLN
10.0000 [IU] | Freq: Once | SUBCUTANEOUS | Status: AC
  Administered 2017-08-09: 10 [IU] via SUBCUTANEOUS
  Filled 2017-08-09: qty 0.1

## 2017-08-09 MED ORDER — NOREPINEPHRINE 4 MG/250ML-% IV SOLN
0.0000 ug/min | INTRAVENOUS | Status: DC
  Administered 2017-08-09: 30 ug/min via INTRAVENOUS
  Administered 2017-08-09: 2 ug/min via INTRAVENOUS
  Filled 2017-08-09 (×3): qty 250

## 2017-08-09 MED ORDER — SODIUM CHLORIDE 0.9 % IV BOLUS (SEPSIS)
1000.0000 mL | Freq: Once | INTRAVENOUS | Status: AC
  Administered 2017-08-09: 1000 mL via INTRAVENOUS

## 2017-08-09 MED ORDER — VITAMINS A & D EX OINT
TOPICAL_OINTMENT | CUTANEOUS | Status: AC
  Administered 2017-08-09: 1
  Filled 2017-08-09: qty 5

## 2017-08-09 NOTE — Progress Notes (Signed)
  Echocardiogram 2D Echocardiogram has been performed.  Jannett Celestine 08/09/2017, 11:40 AM

## 2017-08-09 NOTE — Progress Notes (Addendum)
[facility]  PROGRESS NOTE  Jocelyn Sanchez JKD:[id number] DOB: 1956-01-26 DOA: 08/07/2017 PCP: Dixie Dials, MD   Brief HPI:   62 year old female with a history of MS causing her to be bedridden, diabetes mellitus, hypertension, pulmonary embolism came to ED with complaints of nausea vomiting and diarrhea, patient was found to be in hypothalamic shock. Despite aggressive fluid resuscitation patient remained hypotensive she was started on broad spectrum antibiotics empirically for possible septic shock and admitted to ICU [facility] service. Patient was started on with suppressor support. At this time patient is off his oppressors and has been transferred to hospital service. Hospitalist care resumed on August 09, 2017.   Subjective   Patient seen and examined, denies any complaints. No abdominal pain. No chest pain or shortness of breath.   Assessment/Plan:     1. Septic shock- resolved, off pressors-UTI versus pneumonia. Blood cultures growing gram-negative rods, Proteus mirabilis. Patient is currently on ceftriaxone,  Will follow the final blood culture result. 2. Sacral decubitus ulcer- continue wound care. 3. Diarrhea- still having loose BMs. GI pathogen panel is negative, will order C diff PCR. 4. Multiple sclerosis-patient is not on any treatment for MS. 5. Diabetes mellitus-blood glucose is elevated, will change Lantus to 20 units sub Q daily, continue sliding scale insulin with NovoLog. 6. Hypokalemia- potassium is 2.9, likely from ongoing diarrhea, will replace potassium and check BMP in am. 7. Hypomagnesemia-magnesium 1.2, will replace magnesium with Max sulfate 4 g IV times one. Following magnesium level in a.m. 8.  Pulmonary embolism-continue apixaban      DVT prophylaxis: apixaban  Code Status: partial code-patient is okay with BIPAP and Vasopressors. No intubation or cpr  Family Communication: no family at bedside  Disposition Plan: likely home in one to two  days.   Consultants:  PCCM  Procedures:  none  Continuous infusions . sodium chloride    . sodium chloride 75 mL/hr at 08/09/17 0532  . cefTRIAXone (ROCEPHIN)  IV Stopped (08/08/17 1530)  . norepinephrine Stopped (08/09/17 0830)      Antibiotics:   Anti-infectives (From admission, onward)   Start     Dose/Rate Route Frequency Ordered Stop   08/08/17 1800  vancomycin (VANCOCIN) IVPB 1000 mg/200 mL premix  Status:  Discontinued     1,000 mg 200 mL/hr over 60 Minutes Intravenous Every 48 hours 08/08/17 0109 08/08/17 1403   08/08/17 1500  cefTRIAXone (ROCEPHIN) 2 g in sodium chloride 0.9 % 100 mL IVPB     2 g 200 mL/hr over 30 Minutes Intravenous Every 24 hours 08/08/17 1403     08/08/17 0130  vancomycin (VANCOCIN) 1,500 mg in sodium chloride 0.9 % 500 mL IVPB  Status:  Discontinued     1,500 mg 250 mL/hr over 120 Minutes Intravenous  Once 08/08/17 0100 08/08/17 0101   08/08/17 0115  meropenem (MERREM) 1 g in sodium chloride 0.9 % 100 mL IVPB  Status:  Discontinued     1 g 200 mL/hr over 30 Minutes Intravenous Every 8 hours 08/08/17 0100 08/08/17 1403   08/07/17 2130  piperacillin-tazobactam (ZOSYN) IVPB 3.375 g     3.375 g 100 mL/hr over 30 Minutes Intravenous  Once 08/07/17 2129 08/07/17 2224   08/07/17 2130  vancomycin (VANCOCIN) IVPB 1000 mg/200 mL premix     1,000 mg 200 mL/hr over 60 Minutes Intravenous  Once 08/07/17 2129 08/07/17 2331       Objective   Vitals:   08/09/17 0800 08/09/17 0815 08/09/17 0830 08/09/17 0845  BP: 140/76 130/62 131/77 (!) 102/58  Pulse: (!) 103 98 (!) 104 (!) 103  Resp: (!) 24 (!) 22 (!) 23 19  Temp: 98 F (36.7 C)     TempSrc: Oral     SpO2: 99% 97% 100% 100%  Weight:      Height:        Intake/Output Summary (Last 24 hours) at 08/09/2017 1052 Last data filed at 08/09/2017 0900 Gross per 24 hour  Intake 1279.99 ml  Output 1000 ml  Net 279.99 ml   Filed Weights   08/08/17 0058 08/08/17 0500 08/09/17 0435  Weight: 67.1 kg  (148 lb) 77.4 kg (170 lb 10.2 oz) 84.6 kg (186 lb 8.2 oz)     Physical Examination:   Physical Exam: Eyes: No icterus, extraocular muscles intact  Mouth: Oral mucosa is moist, no lesions on palate,  Neck: Supple, no deformities, masses, or tenderness Lungs: Normal respiratory effort, bilateral clear to auscultation, no crackles or wheezes.  Heart: Regular rate and rhythm, S1 and S2 normal, no murmurs, rubs auscultated Abdomen: BS normoactive,soft,nondistended,non-tender to palpation,no organomegaly Extremities: No pretibial edema, no erythema, no cyanosis, no clubbing Neuro : Alert and oriented to time, motor strength 0/5 in all extremities  Skin: No rashes seen on exam       Data Reviewed: I have personally reviewed following labs and imaging studies  CBG: Recent Labs  Lab 08/08/17 1735 08/08/17 1945 08/08/17 2341 08/09/17 0338 08/09/17 0820  GLUCAP 206* 110* 118* 240* 303*    CBC: Recent Labs  Lab 08/07/17 2310 08/07/17 2328 08/08/17 0523 08/09/17 0527  WBC  --  23.6* 29.0* 36.2*  NEUTROABS  --  22.0*  --   --   HGB 9.9* 7.6* 7.4* 7.5*  HCT 29.0* 23.1* 21.6* 22.3*  MCV  --  74.8* 73.2* 72.6*  PLT  --  265 280 992    Basic Metabolic Panel: Recent Labs  Lab 08/07/17 2328 08/08/17 0523 08/08/17 1308 08/08/17 2000 08/09/17 0527  NA 128* 132* 132* 133* 130*  K 4.0 3.7 2.2* 3.1* 2.9*  CL 97* 102 107 106 101  CO2 14* 15* 13* 15* 13*  GLUCOSE 345* 335* 131* 113* 319*  BUN 46* 42* 35* 35* 32*  CREATININE 1.95* 1.57* 1.16* 1.07* 1.01*  CALCIUM 7.6* 6.8* 6.9* 7.7* 7.1*  MG  --  1.2*  --   --  1.2*  PHOS  --  2.0*  --   --   --     Recent Results (from the past 240 hour(s))  Urine culture     Status: Abnormal   Collection Time: 08/07/17  9:29 PM  Result Value Ref Range Status   Specimen Description   Final    URINE, CATHETERIZED Performed at [facility], [address] [place], [place] [address]    Special Requests   Final     NONE Performed at [facility], [place] [address] [place], [place] [address]    Culture MULTIPLE SPECIES PRESENT, SUGGEST RECOLLECTION (A)  Final   Report Status 08/09/2017 FINAL  Final  Gastrointestinal Panel by PCR , Stool     Status: None   Collection Time: 08/07/17  9:37 PM  Result Value Ref Range Status   Campylobacter species NOT DETECTED NOT DETECTED Final   Plesimonas shigelloides NOT DETECTED NOT DETECTED Final   Salmonella species NOT DETECTED NOT DETECTED Final   Yersinia enterocolitica NOT DETECTED NOT DETECTED Final   Vibrio species NOT DETECTED NOT DETECTED  Final   Vibrio cholerae NOT DETECTED NOT DETECTED Final   Enteroaggregative E coli (EAEC) NOT DETECTED NOT DETECTED Final   Enteropathogenic E coli (EPEC) NOT DETECTED NOT DETECTED Final   Enterotoxigenic E coli (ETEC) NOT DETECTED NOT DETECTED Final   Shiga like toxin producing E coli (STEC) NOT DETECTED NOT DETECTED Final   Shigella/Enteroinvasive E coli (EIEC) NOT DETECTED NOT DETECTED Final   Cryptosporidium NOT DETECTED NOT DETECTED Final   Cyclospora cayetanensis NOT DETECTED NOT DETECTED Final   Entamoeba histolytica NOT DETECTED NOT DETECTED Final   Giardia lamblia NOT DETECTED NOT DETECTED Final   Adenovirus F40/41 NOT DETECTED NOT DETECTED Final   Astrovirus NOT DETECTED NOT DETECTED Final   Norovirus GI/GII NOT DETECTED NOT DETECTED Final   Rotavirus A NOT DETECTED NOT DETECTED Final   Sapovirus (I, II, IV, and V) NOT DETECTED NOT DETECTED Final    Comment: Performed at [facility], [address] [place], [place] [address]  Blood Culture (routine x 2)     Status: Abnormal (Preliminary result)   Collection Time: 08/07/17  9:57 PM  Result Value Ref Range Status   Specimen Description   Final    BLOOD RIGHT HAND Performed at [facility], [place] [address] [place], [place] [address]    Special Requests   Final    BOTTLES DRAWN AEROBIC AND ANAEROBIC Blood  Culture adequate volume   Culture  Setup Time   Final    GRAM NEGATIVE RODS IN BOTH AEROBIC AND ANAEROBIC BOTTLES CRITICAL RESULT CALLED TO, READ BACK BY AND VERIFIED WITH: Christean Grief Pharm.D. 13:45 08/08/17 ([email or web address])    Culture (A)  Final    PROTEUS MIRABILIS SUSCEPTIBILITIES TO FOLLOW Performed at [facility], [place] [address], [place], [place] [address]    Report Status PENDING  Incomplete  Blood Culture ID Panel (Reflexed)     Status: Abnormal   Collection Time: 08/07/17  9:57 PM  Result Value Ref Range Status   Enterococcus species NOT DETECTED NOT DETECTED Final   Listeria monocytogenes NOT DETECTED NOT DETECTED Final   Staphylococcus species NOT DETECTED NOT DETECTED Final   Staphylococcus aureus NOT DETECTED NOT DETECTED Final   Streptococcus species NOT DETECTED NOT DETECTED Final   Streptococcus agalactiae NOT DETECTED NOT DETECTED Final   Streptococcus pneumoniae NOT DETECTED NOT DETECTED Final   Streptococcus pyogenes NOT DETECTED NOT DETECTED Final   Acinetobacter baumannii NOT DETECTED NOT DETECTED Final   Enterobacteriaceae species DETECTED (A) NOT DETECTED Final    Comment: Enterobacteriaceae represent a large family of gram-negative bacteria, not a single organism. CRITICAL RESULT CALLED TO, READ BACK BY AND VERIFIED WITH: Christean Grief Pharm.D. 13:45 08/08/17 ([email or web address])    Enterobacter cloacae complex NOT DETECTED NOT DETECTED Final   Escherichia coli NOT DETECTED NOT DETECTED Final   Klebsiella oxytoca NOT DETECTED NOT DETECTED Final   Klebsiella pneumoniae NOT DETECTED NOT DETECTED Final   Proteus species DETECTED (A) NOT DETECTED Final    Comment: CRITICAL RESULT CALLED TO, READ BACK BY AND VERIFIED WITH: Mila Merry.D. 13:45 08/08/17 ([email or web address])    Serratia marcescens NOT DETECTED NOT DETECTED Final   Carbapenem resistance NOT DETECTED NOT DETECTED Final   Haemophilus influenzae NOT DETECTED NOT DETECTED Final   Neisseria meningitidis NOT DETECTED NOT DETECTED  Final   Pseudomonas aeruginosa NOT DETECTED NOT DETECTED Final   Candida albicans NOT DETECTED NOT DETECTED Final   Candida glabrata NOT DETECTED NOT DETECTED Final   Candida krusei NOT  DETECTED NOT DETECTED Final   Candida parapsilosis NOT DETECTED NOT DETECTED Final   Candida tropicalis NOT DETECTED NOT DETECTED Final  Blood Culture (routine x 2)     Status: None (Preliminary result)   Collection Time: 08/07/17 10:48 PM  Result Value Ref Range Status   Specimen Description   Final    BLOOD BLOOD RIGHT FOREARM Performed at [place] [address], [place], [place] [address]    Special Requests   Final    BOTTLES DRAWN AEROBIC AND ANAEROBIC Blood Culture results may not be optimal due to an inadequate volume of blood received in culture bottles   Culture PENDING  Incomplete   Report Status PENDING  Incomplete  MRSA PCR Screening     Status: Abnormal   Collection Time: 08/08/17 12:40 AM  Result Value Ref Range Status   MRSA by PCR POSITIVE (A) NEGATIVE Final    Comment:        The GeneXpert MRSA Assay (FDA approved for NASAL specimens only), is one component of a comprehensive MRSA colonization surveillance program. It is not intended to diagnose MRSA infection nor to guide or monitor treatment for MRSA infections. RESULT CALLED TO, READ BACK BY AND VERIFIED WITH: LACIVITA,H RN 2.20.19 @346  ZANDO,C Performed at [facility], [place] [address], [place], [place] [address]      Liver Function Tests: Recent Labs  Lab 08/07/17 2328  AST 69*  ALT 27  ALKPHOS 107  BILITOT 1.3*  PROT 5.9*  ALBUMIN 2.3*   No results for input(s): LIPASE, AMYLASE in the last 168 hours. No results for input(s): AMMONIA in the last 168 hours.  Cardiac Enzymes: No results for input(s): CKTOTAL, CKMB, CKMBINDEX, TROPONINI in the last 168 hours. BNP (last 3 results) Recent Labs    08/07/17 2129  BNP 96.8    ProBNP (last 3 results) No results for  input(s): PROBNP in the last 8760 hours.    Studies: Dg Chest Port 1 View  Result Date: 08/09/2017 CLINICAL DATA:  Pneumonia EXAM: PORTABLE CHEST 1 VIEW COMPARISON:  08/08/2017 FINDINGS: Central venous catheter tip at the cavoatrial junction unchanged. Progression of left lower lobe consolidation. Probable small effusion. Mild right lower lobe airspace disease unchanged. Negative for heart failure. IMPRESSION: Progressive left lower lobe atelectasis/infiltrate and small left effusion. Electronically Signed   By: Franchot Gallo M.D.   On: 08/09/2017 07:23   Dg Chest Port 1 View  Result Date: 08/08/2017 CLINICAL DATA:  Central line placement. EXAM: PORTABLE CHEST 1 VIEW COMPARISON:  Chest radiograph August 08, 2017 FINDINGS: RIGHT internal jugular central venous catheter distal tip projects in distal superior vena cava. No pneumothorax. LEFT lung base alveolar airspace opacity with small LEFT pleural effusion. Fullness of the hilar vascular shadows. Cardiac silhouette is normal. Apical pleural thickening. Soft tissue planes and included osseous structures are unchanged. IMPRESSION: Stable LEFT lung base airspace opacity concerning for pneumonia with small LEFT pleural effusion. RIGHT internal jugular central venous catheter distal tip projects in distal superior vena cava. No pneumothorax. Prominent pulmonary arteries associated with pulmonary arterial hypertension. Electronically Signed   By: Elon Alas M.D.   On: 08/08/2017 02:14   Dg Chest Port 1 View  Result Date: 08/08/2017 CLINICAL DATA:  Vomiting EXAM: PORTABLE CHEST 1 VIEW COMPARISON:  07/11/2016 FINDINGS: Small left pleural effusion and left basilar infiltrate. Borderline cardiomegaly. Aortic atherosclerosis. No pneumothorax. Right lung is clear. IMPRESSION: Left basilar airspace disease suspicious for pneumonia with probable small left pleural effusion.  Electronically Signed   By: Donavan Foil M.D.   On: 08/08/2017 00:27     Scheduled Meds: . apixaban  2.5 mg Oral BID  . Chlorhexidine Gluconate Cloth  6 each Topical Q0600  . famotidine  20 mg Oral BID  . insulin aspart  0-20 Units Subcutaneous TID WC  . insulin aspart  4 Units Subcutaneous TID WC  . insulin glargine  10 Units Subcutaneous Daily  . mupirocin ointment  1 application Nasal BID  . nystatin   Topical BID  . potassium chloride  40 mEq Oral BID  . potassium chloride  40 mEq Oral Q4H      Time spent: 25 min  [facility] Pager [phone]. If 7PM-7AM, please contact night-coverage at [email or web address], Office  [phone]  password [facility]  08/09/2017, 10:52 AM  LOS: 1 day

## 2017-08-09 NOTE — Progress Notes (Signed)
.  [facility] ADULT ICU REPLACEMENT PROTOCOL FOR AM LAB REPLACEMENT ONLY  The patient does apply for the [facility] Adult ICU Electrolyte Replacment Protocol based on the criteria listed below:   1. Is GFR >/= 40 ml/min? Yes.    Patient's GFR today is >60 2. Is urine output >/= 0.5 ml/kg/hr for the last 6 hours? Yes.   Patient's UOP is .79 ml/kg/hr 3. Is BUN < 60 mg/dL? Yes.    Patient's BUN today is 32 4. Abnormal electrolyte(s): K-2.9 6. If a panic level lab has been reported, has the CCM MD in charge been notified? Yes.  .   Physician:  Dr. Carver Fila, Philis Nettle 08/09/2017 6:19 AM

## 2017-08-09 NOTE — Progress Notes (Signed)
Inpatient Diabetes Program Recommendations  AACE/ADA: New Consensus Statement on Inpatient Glycemic Control (2015)  Target Ranges:  Prepandial:   less than 140 mg/dL      Peak postprandial:   less than 180 mg/dL (1-2 hours)      Critically ill patients:  140 - 180 mg/dL   Lab Results  Component Value Date   GLUCAP 303 (H) 08/09/2017   HGBA1C 6.7 (H) 11/28/2014    Review of Glycemic Control  Diabetes history: DM2 Outpatient Diabetes medications: glipizide 5 mg bid Current orders for Inpatient glycemic control: Lantus 10 units QD, Novolog 0-20 units tidwc + 4 units tidwc.  Needs updated HgbA1C - 6.7% from 2 years ago.  Inpatient Diabetes Program Recommendations:     Please order HgbA1C to assess glycemic control prior to admission.  Thank you. Lorenda Peck, RD, LDN, CDE Inpatient Diabetes Coordinator [phone]

## 2017-08-09 NOTE — Progress Notes (Signed)
MD notified for home dme-mattress-alternating pressure pad-[facility] rep Santiago Glad will process once ordered.

## 2017-08-10 ENCOUNTER — Inpatient Hospital Stay (HOSPITAL_COMMUNITY): Payer: Medicare Other

## 2017-08-10 ENCOUNTER — Other Ambulatory Visit: Payer: Self-pay

## 2017-08-10 LAB — GLUCOSE, CAPILLARY
GLUCOSE-CAPILLARY: 185 mg/dL — AB (ref 65–99)
GLUCOSE-CAPILLARY: 291 mg/dL — AB (ref 65–99)
GLUCOSE-CAPILLARY: 244 mg/dL — AB (ref 65–99)
Glucose-Capillary: 143 mg/dL — ABNORMAL HIGH (ref 65–99)

## 2017-08-10 LAB — OCCULT BLOOD X 1 CARD TO LAB, STOOL: FECAL OCCULT BLD: POSITIVE — AB

## 2017-08-10 LAB — BASIC METABOLIC PANEL
Anion gap: 8 (ref 5–15)
BUN: 25 mg/dL — ABNORMAL HIGH (ref 6–20)
CHLORIDE: 111 mmol/L (ref 101–111)
CO2: 15 mmol/L — AB (ref 22–32)
CREATININE: 0.75 mg/dL (ref 0.44–1.00)
Calcium: 7.1 mg/dL — ABNORMAL LOW (ref 8.9–10.3)
GFR calc non Af Amer: >60 mL/min (ref >60)
Glucose, Bld: 143 mg/dL — ABNORMAL HIGH (ref 65–99)
POTASSIUM: 4.3 mmol/L (ref 3.5–5.1)
SODIUM: 134 mmol/L — AB (ref 135–145)

## 2017-08-10 LAB — PROCALCITONIN: Procalcitonin: 45.38 ng/mL

## 2017-08-10 LAB — CULTURE, BLOOD (ROUTINE X 2): Special Requests: ADEQUATE

## 2017-08-10 LAB — MAGNESIUM: MAGNESIUM: 1.6 mg/dL — AB (ref 1.7–2.4)

## 2017-08-10 LAB — C DIFFICILE QUICK SCREEN W PCR REFLEX
C DIFFICILE (CDIFF) TOXIN: NEGATIVE
C Diff antigen: POSITIVE — AB

## 2017-08-10 MED ORDER — BOOST / RESOURCE BREEZE PO LIQD CUSTOM
1.0000 | Freq: Three times a day (TID) | ORAL | Status: DC
  Administered 2017-08-10 – 2017-08-17 (×17): 1 via ORAL

## 2017-08-10 MED ORDER — CARVEDILOL 3.125 MG PO TABS
3.1250 mg | ORAL_TABLET | Freq: Two times a day (BID) | ORAL | Status: DC
  Administered 2017-08-10 – 2017-08-17 (×13): 3.125 mg via ORAL
  Filled 2017-08-10 (×14): qty 1

## 2017-08-10 MED ORDER — PIPERACILLIN-TAZOBACTAM 3.375 G IVPB
3.3750 g | Freq: Three times a day (TID) | INTRAVENOUS | Status: DC
  Administered 2017-08-10 – 2017-08-13 (×10): 3.375 g via INTRAVENOUS
  Filled 2017-08-10 (×9): qty 50

## 2017-08-10 MED ORDER — ADULT MULTIVITAMIN W/MINERALS CH
1.0000 | ORAL_TABLET | Freq: Every day | ORAL | Status: DC
  Administered 2017-08-10 – 2017-08-17 (×8): 1 via ORAL
  Filled 2017-08-10 (×8): qty 1

## 2017-08-10 MED ORDER — SODIUM CHLORIDE 0.9 % IV BOLUS (SEPSIS)
1000.0000 mL | Freq: Once | INTRAVENOUS | Status: AC
  Administered 2017-08-10: 1000 mL via INTRAVENOUS

## 2017-08-10 MED ORDER — ACETAMINOPHEN 325 MG PO TABS
650.0000 mg | ORAL_TABLET | Freq: Four times a day (QID) | ORAL | Status: DC | PRN
  Administered 2017-08-10 – 2017-08-12 (×4): 650 mg via ORAL
  Filled 2017-08-10 (×5): qty 2

## 2017-08-10 MED ORDER — MAGNESIUM SULFATE 2 GM/50ML IV SOLN
2.0000 g | Freq: Once | INTRAVENOUS | Status: AC
  Administered 2017-08-10: 2 g via INTRAVENOUS
  Filled 2017-08-10: qty 50

## 2017-08-10 NOTE — Progress Notes (Signed)
Initial Nutrition Assessment  DOCUMENTATION CODES:   Obesity unspecified  INTERVENTION:  - Will order Boost Breeze TID, each supplement provides 250 kcal and 9 grams of protein - Will order daily multivitamin with minerals.  - Tech/RN to assist with feeding pt. - Continue to encourage PO intakes.   NUTRITION DIAGNOSIS:   Inadequate oral intake related to acute illness, lethargy/confusion as evidenced by per patient/family report.  GOAL:   Patient will meet greater than or equal to 90% of their needs  MONITOR:   PO intake, Supplement acceptance, Weight trends, Labs, Skin  REASON FOR ASSESSMENT:   Low Braden  ASSESSMENT:   62 year old female with a history of MS causing her to be bedridden, diabetes mellitus, hypertension, pulmonary embolism came to ED with complaints of nausea vomiting and diarrhea, patient was found to be in hypothalamic shock. Despite aggressive fluid resuscitation patient remained hypotensive she was started on broad spectrum antibiotics empirically for possible septic shock and admitted to ICU   BMI indicates obesity. Pt's diet was advanced from NPO to CLD on 2/20 at 9:00 AM. Only documented intake is 60% of breakfast this AM. Diet advanced from CLD to Soft today at 10:25 AM. Pt was somewhat confused and unable to provide information other than to deny abdominal pain or nausea at this time.   Pt's aunt was at bedside and provided all information. Pt is unable to move from the neck down and needs to be fed. She typically grazes throughout the day rather than eating meals. Ambassador stopped during RD visit and pt's aunt ordered dinner and breakfast items for pt. Aunt reports that at baseline pt has some difficulty with swallowing but she was unable to elaborate. SLP has not seen pt during this hospitalization.   Spoke with RN who reports that she trialed Ensure and [employer] with pt. Pt coughed when attempting to drink Ensure but did well with Breeze. RN  states pt seems to do better with thinner liquids.   Aunt feels that pt's UBW may be around 170 lbs, although she does not know for sure. She states pt's face currently looks more swollen than usual. Used UBW in estimating nutrition needs as pt was at that weight at time of admission and is now +16 lbs with moderate edema noted.  Medications reviewed; 20 mg oral Pepcid BID, sliding scale Novolog, 4 units Novolog TID, 20 units Lantus/day, 2 g IV Mg sulfate x1 run today, 40 mEq oral KCl BID.  Labs reviewed; CBGs: 143 and 185 mg/dL today, Na: 134 mmol/L, BUN: 25 mg/dL, Ca: 7.1 mg/dL, Mg: 1.6 mg/dL.  IVF: NS @ 100 mL/hr.      NUTRITION - FOCUSED PHYSICAL EXAM:  Completed/assessed with no muscle and no fat wasting, moderate edema noted throughout extremities and facial area.  Diet Order:  DIET SOFT Room service appropriate? Yes; Fluid consistency: Thin  EDUCATION NEEDS:   No education needs have been identified at this time  Skin:  Skin Assessment: Skin Integrity Issues: Skin Integrity Issues:: Stage III Stage III: sacrum  Last BM:  2/20  Height:   Ht Readings from Last 1 Encounters:  08/08/17 5\' 3"  (1.6 m)    Weight:   Wt Readings from Last 1 Encounters:  08/10/17 186 lb 1.1 oz (84.4 kg)    Ideal Body Weight:  52.27 kg  BMI:  Body mass index is 32.96 kg/m.  Estimated Nutritional Needs:   Kcal:  1930-2165 (25-28 kcal/kg)  Protein:  110-127 grams (1.3-1.5 grams/kg)  Fluid:  >/=  2 L/day      Jarome Matin, MS, RD, LDN, [facility] Inpatient Clinical Dietitian Pager # [phone] After hours/weekend pager # [phone]

## 2017-08-10 NOTE — Discharge Summary (Signed)
[facility]  PROGRESS NOTE  Jocelyn Sanchez PZW:[id number] DOB: 31-Aug-1955 DOA: 08/07/2017 PCP: Dixie Dials, MD   Brief HPI:   62 year old female with a history of MS causing her to be bedridden, diabetes mellitus, hypertension, pulmonary embolism came to ED with complaints of nausea vomiting and diarrhea, patient was found to be in hypothalamic shock. Despite aggressive fluid resuscitation patient remained hypotensive she was started on broad spectrum antibiotics empirically for possible septic shock and admitted to ICU [facility] service. Patient was started on with suppressor support. At this time patient is off his oppressors and has been transferred to hospital service. Hospitalist care resumed on August 09, 2017.   Subjective   Patient seen and examined, slightly confused, BP is till low.   Assessment/Plan:     1. Septic shock- resolved, off pressors-UTI versus pneumonia. Blood cultures growing gram-negative rods, Proteus mirabilis. Patient is currently on ceftriaxone,  Will change to Zosyn as she is still febrile, with hypotension.WBC 36000 2. Sacral decubitus ulcer- continue wound care. 3. Diarrhea- still having loose BMs. GI pathogen panel is negative,  C diff PCR ordered. 4. Multiple sclerosis-patient is not on any treatment for MS. 5. Diabetes mellitus-blood glucose is better controlled after changing  Lantus to 20 units sub Q daily, continue sliding scale insulin with NovoLog. 6. Hypokalemia-  Replete. 7. Hypomagnesemia-magnesium 1.6, will replace magnesium with magsulfate 1 g IV times one. Following magnesium level in a.m. 8.  Pulmonary embolism-continue apixaban      DVT prophylaxis: apixaban  Code Status: partial code-patient is okay with BIPAP and Vasopressors. No intubation or cpr  Family Communication: no family at bedside  Disposition Plan: likely home in one to two days.   Consultants:  PCCM  Procedures:  none  Continuous infusions . sodium  chloride    . sodium chloride 100 mL/hr at 08/10/17 0806  . norepinephrine Stopped (08/09/17 0830)  . piperacillin-tazobactam (ZOSYN)  IV 3.375 g (08/10/17 1018)      Antibiotics:   Anti-infectives (From admission, onward)   Start     Dose/Rate Route Frequency Ordered Stop   08/10/17 1000  piperacillin-tazobactam (ZOSYN) IVPB 3.375 g     3.375 g 12.5 mL/hr over 240 Minutes Intravenous Every 8 hours 08/10/17 0833     08/08/17 1800  vancomycin (VANCOCIN) IVPB 1000 mg/200 mL premix  Status:  Discontinued     1,000 mg 200 mL/hr over 60 Minutes Intravenous Every 48 hours 08/08/17 0109 08/08/17 1403   08/08/17 1500  cefTRIAXone (ROCEPHIN) 2 g in sodium chloride 0.9 % 100 mL IVPB  Status:  Discontinued     2 g 200 mL/hr over 30 Minutes Intravenous Every 24 hours 08/08/17 1403 08/10/17 0756   08/08/17 0130  vancomycin (VANCOCIN) 1,500 mg in sodium chloride 0.9 % 500 mL IVPB  Status:  Discontinued     1,500 mg 250 mL/hr over 120 Minutes Intravenous  Once 08/08/17 0100 08/08/17 0101   08/08/17 0115  meropenem (MERREM) 1 g in sodium chloride 0.9 % 100 mL IVPB  Status:  Discontinued     1 g 200 mL/hr over 30 Minutes Intravenous Every 8 hours 08/08/17 0100 08/08/17 1403   08/07/17 2130  piperacillin-tazobactam (ZOSYN) IVPB 3.375 g     3.375 g 100 mL/hr over 30 Minutes Intravenous  Once 08/07/17 2129 08/07/17 2224   08/07/17 2130  vancomycin (VANCOCIN) IVPB 1000 mg/200 mL premix     1,000 mg 200 mL/hr over 60 Minutes Intravenous  Once 08/07/17 2129 08/07/17 2331  Objective   Vitals:   08/10/17 0600 08/10/17 0700 08/10/17 0730 08/10/17 0800  BP: (!) 83/47 (!) 87/50  (!) 105/54  Pulse: (!) 116 (!) 114 (!) 112 (!) 113  Resp: 18 (!) 23 (!) 30 (!) 30  Temp:    99.5 F (37.5 C)  TempSrc:    Oral  SpO2: 94% 94% 95% 95%  Weight:      Height:        Intake/Output Summary (Last 24 hours) at 08/10/2017 1326 Last data filed at 08/10/2017 0806 Gross per 24 hour  Intake 1902 ml  Output  725 ml  Net 1177 ml   Filed Weights   08/08/17 0500 08/09/17 0435 08/10/17 0500  Weight: 77.4 kg (170 lb 10.2 oz) 84.6 kg (186 lb 8.2 oz) 84.4 kg (186 lb 1.1 oz)     Physical Examination:  Physical Exam: Eyes: No icterus, extraocular muscles intact  Mouth: Oral mucosa is moist, no lesions on palate,  Neck: Supple, no deformities, masses, or tenderness Lungs: Normal respiratory effort, bilateral clear to auscultation, no crackles or wheezes.  Heart: Regular rate and rhythm, S1 and S2 normal, no murmurs, rubs auscultated Abdomen: BS normoactive,soft,nondistended,non-tender to palpation,no organomegaly Extremities: No pretibial edema, no erythema, no cyanosis, no clubbing Neuro : Alert and oriented to time, motor 0/5 in all four extremities  Skin: No rashes seen on exam      Data Reviewed: I have personally reviewed following labs and imaging studies  CBG: Recent Labs  Lab 08/09/17 0820 08/09/17 1312 08/09/17 2106 08/10/17 0810 08/10/17 1236  GLUCAP 303* 173* 85 143* 185*    CBC: Recent Labs  Lab 08/07/17 2310 08/07/17 2328 08/08/17 0523 08/09/17 0527  WBC  --  23.6* 29.0* 36.2*  NEUTROABS  --  22.0*  --   --   HGB 9.9* 7.6* 7.4* 7.5*  HCT 29.0* 23.1* 21.6* 22.3*  MCV  --  74.8* 73.2* 72.6*  PLT  --  265 280 409    Basic Metabolic Panel: Recent Labs  Lab 08/08/17 0523 08/08/17 1308 08/08/17 2000 08/09/17 0527 08/09/17 2057 08/10/17 0504  NA 132* 132* 133* 130* 132* 134*  K 3.7 2.2* 3.1* 2.9* 3.6 4.3  CL 102 107 106 101 108 111  CO2 15* 13* 15* 13* 15* 15*  GLUCOSE 335* 131* 113* 319* 137* 143*  BUN 42* 35* 35* 32* 26* 25*  CREATININE 1.57* 1.16* 1.07* 1.01* 0.84 0.75  CALCIUM 6.8* 6.9* 7.7* 7.1* 7.3* 7.1*  MG 1.2*  --   --  1.2*  --  1.6*  PHOS 2.0*  --   --   --   --   --     Recent Results (from the past 240 hour(s))  Urine culture     Status: Abnormal   Collection Time: 08/07/17  9:29 PM  Result Value Ref Range Status   Specimen  Description   Final    URINE, CATHETERIZED Performed at [facility], [place] [address], [place], [place] [address]    Special Requests   Final    NONE Performed at [facility], [place] [address], [place], [place] [address]    Culture MULTIPLE SPECIES PRESENT, SUGGEST RECOLLECTION (A)  Final   Report Status 08/09/2017 FINAL  Final  Gastrointestinal Panel by PCR , Stool     Status: None   Collection Time: 08/07/17  9:37 PM  Result Value Ref Range Status   Campylobacter species NOT DETECTED NOT DETECTED Final   Plesimonas  shigelloides NOT DETECTED NOT DETECTED Final   Salmonella species NOT DETECTED NOT DETECTED Final   Yersinia enterocolitica NOT DETECTED NOT DETECTED Final   Vibrio species NOT DETECTED NOT DETECTED Final   Vibrio cholerae NOT DETECTED NOT DETECTED Final   Enteroaggregative E coli (EAEC) NOT DETECTED NOT DETECTED Final   Enteropathogenic E coli (EPEC) NOT DETECTED NOT DETECTED Final   Enterotoxigenic E coli (ETEC) NOT DETECTED NOT DETECTED Final   Shiga like toxin producing E coli (STEC) NOT DETECTED NOT DETECTED Final   Shigella/Enteroinvasive E coli (EIEC) NOT DETECTED NOT DETECTED Final   Cryptosporidium NOT DETECTED NOT DETECTED Final   Cyclospora cayetanensis NOT DETECTED NOT DETECTED Final   Entamoeba histolytica NOT DETECTED NOT DETECTED Final   Giardia lamblia NOT DETECTED NOT DETECTED Final   Adenovirus F40/41 NOT DETECTED NOT DETECTED Final   Astrovirus NOT DETECTED NOT DETECTED Final   Norovirus GI/GII NOT DETECTED NOT DETECTED Final   Rotavirus A NOT DETECTED NOT DETECTED Final   Sapovirus (I, II, IV, and V) NOT DETECTED NOT DETECTED Final    Comment: Performed at [facility], [place] [place], [place] [address]  Blood Culture (routine x 2)     Status: Abnormal   Collection Time: 08/07/17  9:57 PM  Result Value Ref Range Status   Specimen Description   Final    BLOOD RIGHT HAND Performed at [facility], [place] [address], [place], [place] [address]    Special Requests   Final    BOTTLES DRAWN AEROBIC AND ANAEROBIC Blood Culture adequate volume   Culture  Setup Time   Final    GRAM NEGATIVE RODS IN BOTH AEROBIC AND ANAEROBIC BOTTLES CRITICAL RESULT CALLED TO, READ BACK BY AND VERIFIED WITH: Christean Grief Pharm.D. 13:45 08/08/17 ([email or web address]) Performed at [facility], [place] [address], [place], [place] [address]    Culture PROTEUS MIRABILIS (A)  Final   Report Status 08/10/2017 FINAL  Final   Organism ID, Bacteria PROTEUS MIRABILIS  Final      Susceptibility   Proteus mirabilis - MIC*    AMPICILLIN <=2 SENSITIVE Sensitive     CEFAZOLIN <=4 SENSITIVE Sensitive     CEFEPIME <=1 SENSITIVE Sensitive     CEFTAZIDIME <=1 SENSITIVE Sensitive     CEFTRIAXONE <=1 SENSITIVE Sensitive     CIPROFLOXACIN 2 INTERMEDIATE Intermediate     GENTAMICIN <=1 SENSITIVE Sensitive     IMIPENEM 2 SENSITIVE Sensitive     TRIMETH/SULFA <=20 SENSITIVE Sensitive     AMPICILLIN/SULBACTAM <=2 SENSITIVE Sensitive     PIP/TAZO <=4 SENSITIVE Sensitive     * PROTEUS MIRABILIS  Blood Culture ID Panel (Reflexed)     Status: Abnormal   Collection Time: 08/07/17  9:57 PM  Result Value Ref Range Status   Enterococcus species NOT DETECTED NOT DETECTED Final   Listeria monocytogenes NOT DETECTED NOT DETECTED Final   Staphylococcus species NOT DETECTED NOT DETECTED Final   Staphylococcus aureus NOT DETECTED NOT DETECTED Final   Streptococcus species NOT DETECTED NOT DETECTED Final   Streptococcus agalactiae NOT DETECTED NOT DETECTED Final   Streptococcus pneumoniae NOT DETECTED NOT DETECTED Final   Streptococcus pyogenes NOT DETECTED NOT DETECTED Final   Acinetobacter baumannii NOT DETECTED NOT DETECTED Final   Enterobacteriaceae species DETECTED (A) NOT DETECTED Final    Comment: Enterobacteriaceae represent a large family of gram-negative bacteria, not a single organism. CRITICAL RESULT CALLED TO,  READ BACK BY AND VERIFIED WITH: Christean Grief Pharm.D.  13:45 08/08/17 ([email or web address])    Enterobacter cloacae complex NOT DETECTED NOT DETECTED Final   Escherichia coli NOT DETECTED NOT DETECTED Final   Klebsiella oxytoca NOT DETECTED NOT DETECTED Final   Klebsiella pneumoniae NOT DETECTED NOT DETECTED Final   Proteus species DETECTED (A) NOT DETECTED Final    Comment: CRITICAL RESULT CALLED TO, READ BACK BY AND VERIFIED WITH: Mila Merry.D. 13:45 08/08/17 ([email or web address])    Serratia marcescens NOT DETECTED NOT DETECTED Final   Carbapenem resistance NOT DETECTED NOT DETECTED Final   Haemophilus influenzae NOT DETECTED NOT DETECTED Final   Neisseria meningitidis NOT DETECTED NOT DETECTED Final   Pseudomonas aeruginosa NOT DETECTED NOT DETECTED Final   Candida albicans NOT DETECTED NOT DETECTED Final   Candida glabrata NOT DETECTED NOT DETECTED Final   Candida krusei NOT DETECTED NOT DETECTED Final   Candida parapsilosis NOT DETECTED NOT DETECTED Final   Candida tropicalis NOT DETECTED NOT DETECTED Final  Blood Culture (routine x 2)     Status: None (Preliminary result)   Collection Time: 08/07/17 10:48 PM  Result Value Ref Range Status   Specimen Description   Final    BLOOD BLOOD RIGHT FOREARM Performed at [facility], [place] [address], [place], [place] [address]    Special Requests   Final    BOTTLES DRAWN AEROBIC AND ANAEROBIC Blood Culture results may not be optimal due to an inadequate volume of blood received in culture bottles   Culture   Final    NO GROWTH 1 DAY Performed at [facility], [place] [address] [place], [place] [address]    Report Status PENDING  Incomplete  MRSA PCR Screening     Status: Abnormal   Collection Time: 08/08/17 12:40 AM  Result Value Ref Range Status   MRSA by PCR POSITIVE (A) NEGATIVE Final    Comment:        The GeneXpert MRSA Assay (FDA approved for NASAL specimens only), is one component of a comprehensive MRSA  colonization surveillance program. It is not intended to diagnose MRSA infection nor to guide or monitor treatment for MRSA infections. RESULT CALLED TO, READ BACK BY AND VERIFIED WITH: LACIVITA,H RN 2.20.19 @346  ZANDO,C Performed at [facility], [place] [address], [place],  [address]      Liver Function Tests: Recent Labs  Lab 08/07/17 2328  AST 69*  ALT 27  ALKPHOS 107  BILITOT 1.3*  PROT 5.9*  ALBUMIN 2.3*   No results for input(s): LIPASE, AMYLASE in the last 168 hours. No results for input(s): AMMONIA in the last 168 hours.  Cardiac Enzymes: No results for input(s): CKTOTAL, CKMB, CKMBINDEX, TROPONINI in the last 168 hours. BNP (last 3 results) Recent Labs    08/07/17 2129  BNP 96.8    ProBNP (last 3 results) No results for input(s): PROBNP in the last 8760 hours.    Studies: Dg Chest Port 1 View  Result Date: 08/09/2017 CLINICAL DATA:  Pneumonia EXAM: PORTABLE CHEST 1 VIEW COMPARISON:  08/08/2017 FINDINGS: Central venous catheter tip at the cavoatrial junction unchanged. Progression of left lower lobe consolidation. Probable small effusion. Mild right lower lobe airspace disease unchanged. Negative for heart failure. IMPRESSION: Progressive left lower lobe atelectasis/infiltrate and small left effusion. Electronically Signed   By: Franchot Gallo M.D.   On: 08/09/2017 07:23    Scheduled Meds: . apixaban  2.5 mg Oral BID  . Chlorhexidine Gluconate Cloth  6 each Topical Q0600  . famotidine  20 mg  Oral BID  . insulin aspart  0-20 Units Subcutaneous TID WC  . insulin aspart  4 Units Subcutaneous TID WC  . insulin glargine  20 Units Subcutaneous Daily  . mupirocin ointment  1 application Nasal BID  . nystatin   Topical BID  . potassium chloride  40 mEq Oral BID      Time spent: 25 min  [facility] Pager [phone]. If 7PM-7AM, please contact night-coverage at [email or web address], Office  [phone]  password  [email or web address]  08/10/2017, 1:26 PM  LOS: 2 days

## 2017-08-10 NOTE — Progress Notes (Signed)
Patient usually takes Robaxin at night, per family do not give during day due to patient being drowsy all day.

## 2017-08-10 NOTE — Progress Notes (Signed)
Patients temperature increased to 102.8 tylenol given as ordered and MD paged to notify.

## 2017-08-10 NOTE — Progress Notes (Signed)
Patients BP soft post Coreg admin, patient coughing with liquids. MD paged and verbal orders received for portable chest XRay.

## 2017-08-10 NOTE — Progress Notes (Signed)
Patient has had increase in heart rate, and increase in respirations. MD paged and arrived at bedside will await further orders.

## 2017-08-11 DIAGNOSIS — R0682 Tachypnea, not elsewhere classified: Secondary | ICD-10-CM

## 2017-08-11 LAB — BASIC METABOLIC PANEL
Anion gap: 7 (ref 5–15)
BUN: 18 mg/dL (ref 6–20)
CHLORIDE: 111 mmol/L (ref 101–111)
CO2: 16 mmol/L — ABNORMAL LOW (ref 22–32)
CREATININE: 0.62 mg/dL (ref 0.44–1.00)
Calcium: 7.5 mg/dL — ABNORMAL LOW (ref 8.9–10.3)
GFR calc Af Amer: >60 mL/min (ref >60)
GLUCOSE: 239 mg/dL — AB (ref 65–99)
POTASSIUM: 4.4 mmol/L (ref 3.5–5.1)
Sodium: 134 mmol/L — ABNORMAL LOW (ref 135–145)

## 2017-08-11 LAB — CBC
HCT: 22 % — ABNORMAL LOW (ref 36.0–46.0)
Hemoglobin: 7.4 g/dL — ABNORMAL LOW (ref 12.0–15.0)
MCH: 23.9 pg — ABNORMAL LOW (ref 26.0–34.0)
MCHC: 33.6 g/dL (ref 30.0–36.0)
MCV: 71 fL — AB (ref 78.0–100.0)
PLATELETS: 69 10*3/uL — AB (ref 150–400)
RBC: 3.1 MIL/uL — AB (ref 3.87–5.11)
RDW: 18.3 % — ABNORMAL HIGH (ref 11.5–15.5)
WBC: 20.6 10*3/uL — ABNORMAL HIGH (ref 4.0–10.5)

## 2017-08-11 LAB — GLUCOSE, CAPILLARY
GLUCOSE-CAPILLARY: 267 mg/dL — AB (ref 65–99)
Glucose-Capillary: 220 mg/dL — ABNORMAL HIGH (ref 65–99)
Glucose-Capillary: 214 mg/dL — ABNORMAL HIGH (ref 65–99)

## 2017-08-11 LAB — CLOSTRIDIUM DIFFICILE BY PCR, REFLEXED: Toxigenic C. Difficile by PCR: NEGATIVE

## 2017-08-11 NOTE — Progress Notes (Signed)
[facility]  PROGRESS NOTE  Jocelyn Sanchez KNL:[id number] DOB: [birth year] DOA: 08/07/2017 PCP: Dixie Dials, MD   Brief HPI:   62 year old female with a history of MS causing her to be bedridden, diabetes mellitus, hypertension, pulmonary embolism came to ED with complaints of nausea vomiting and diarrhea, patient was found to be in hypothalamic shock. Despite aggressive fluid resuscitation patient remained hypotensive she was started on broad spectrum antibiotics empirically for possible septic shock and admitted to ICU [facility] service. Patient was started on with suppressor support. At this time patient is off his oppressors and has been transferred to hospital service. Hospitalist care resumed on August 09, 2017.   Subjective   Patient seen and examined, surprisingly patient's blood pressure improved and tachycardia resolved after switching from ceftriaxone to IV Zosyn and restarting Coreg. Chest x-ray yesterday showed no significant abnormality.   Assessment/Plan:     1. Septic shock- resolved, off pressors-UTI versus pneumonia. Blood cultures growing gram-negative rods, Proteus mirabilis. Patient but continues to spike fever and also was hypotensive. IV antibiotics will change more broad spectrum IV Zosyn and patient has improved with IV Zosyn. WBC is now down to 20,000 from 36,000. I will continue with IV Zosyn as patient 2. Sacral decubitus ulcer- continue wound care. 3. Diarrhea-resolved, patient had one BM which was formed. C. diff antigen is positive, toxin is negative would not treat at this time. GI pathogen panel is negative. 4. Multiple sclerosis-patient is not on any treatment for MS. Diabetes mellitus-blood glucose is better controlled after changing  Lantus to 20 units sub Q daily, continue sliding scale insulin with NovoLog.  CBG (last 3)  Recent Labs    08/10/17 1630 08/10/17 2121 08/11/17 0759  GLUCAP 291* 244* 220*  5.  6. Hypokalemia-   Replete. 7. Hypomagnesemia-magnesium was 1.6, magnesium was replaced. Will check serum magnesium level in a.m. 8.  Pulmonary embolism-continue apixaban  9. Thrombocytopenia - today platelet count is 69,000, likely from antibiotics, IV Zosyn. Patient is not on Lovenox or heparin. Will repeat CBC in a.m.      DVT prophylaxis: apixaban  Code Status: partial code-patient is okay with BIPAP and Vasopressors. No intubation or cpr  Family Communication: no family at bedside  Disposition Plan: likely home in one to two days.   Consultants:  PCCM  Procedures:  none  Continuous infusions . sodium chloride    . norepinephrine Stopped (08/09/17 0830)  . piperacillin-tazobactam (ZOSYN)  IV Stopped (08/11/17 0533)      Antibiotics:   Anti-infectives (From admission, onward)   Start     Dose/Rate Route Frequency Ordered Stop   08/10/17 1000  piperacillin-tazobactam (ZOSYN) IVPB 3.375 g     3.375 g 12.5 mL/hr over 240 Minutes Intravenous Every 8 hours 08/10/17 0833     08/08/17 1800  vancomycin (VANCOCIN) IVPB 1000 mg/200 mL premix  Status:  Discontinued     1,000 mg 200 mL/hr over 60 Minutes Intravenous Every 48 hours 08/08/17 0109 08/08/17 1403   08/08/17 1500  cefTRIAXone (ROCEPHIN) 2 g in sodium chloride 0.9 % 100 mL IVPB  Status:  Discontinued     2 g 200 mL/hr over 30 Minutes Intravenous Every 24 hours 08/08/17 1403 08/10/17 0756   08/08/17 0130  vancomycin (VANCOCIN) 1,500 mg in sodium chloride 0.9 % 500 mL IVPB  Status:  Discontinued     1,500 mg 250 mL/hr over 120 Minutes Intravenous  Once 08/08/17 0100 08/08/17 0101   08/08/17 0115  meropenem (MERREM) 1 g  in sodium chloride 0.9 % 100 mL IVPB  Status:  Discontinued     1 g 200 mL/hr over 30 Minutes Intravenous Every 8 hours 08/08/17 0100 08/08/17 1403   08/07/17 2130  piperacillin-tazobactam (ZOSYN) IVPB 3.375 g     3.375 g 100 mL/hr over 30 Minutes Intravenous  Once 08/07/17 2129 08/07/17 2224   08/07/17 2130   vancomycin (VANCOCIN) IVPB 1000 mg/200 mL premix     1,000 mg 200 mL/hr over 60 Minutes Intravenous  Once 08/07/17 2129 08/07/17 2331       Objective   Vitals:   08/11/17 0758 08/11/17 0759 08/11/17 0800 08/11/17 0900  BP: (!) 121/58 (!) 121/58 (!) 124/52 (!) 118/50  Pulse: 83 88  94  Resp: 17 (!) 22 17   Temp:  98.8 F (37.1 C) 98.8 F (37.1 C)   TempSrc:  Oral Oral   SpO2: 100% 100%  96%  Weight:      Height:        Intake/Output Summary (Last 24 hours) at 08/11/2017 0954 Last data filed at 08/11/2017 0800 Gross per 24 hour  Intake 450 ml  Output 900 ml  Net -450 ml   Filed Weights   08/09/17 0435 08/10/17 0500 08/11/17 0449  Weight: 84.6 kg (186 lb 8.2 oz) 84.4 kg (186 lb 1.1 oz) 86.9 kg (191 lb 9.3 oz)     Physical Examination:  Physical Exam: Eyes: No icterus, extraocular muscles intact  Mouth: Oral mucosa is moist, no lesions on palate,  Neck: Supple, no deformities, masses, or tenderness Lungs: Normal respiratory effort, bilateral clear to auscultation, no crackles or wheezes.  Heart: Regular rate and rhythm, S1 and S2 normal, no murmurs, rubs auscultated Abdomen: BS normoactive,soft,nondistended,non-tender to palpation,no organomegaly Extremities: No pretibial edema, no erythema, no cyanosis, no clubbing Neuro : Alert and oriented to time, place and person, motor 1/5 in all extremities Skin: No rashes seen on exam      Data Reviewed: I have personally reviewed following labs and imaging studies  CBG: Recent Labs  Lab 08/10/17 0810 08/10/17 1236 08/10/17 1630 08/10/17 2121 08/11/17 0759  GLUCAP 143* 185* 291* 244* 220*    CBC: Recent Labs  Lab 08/07/17 2310 08/07/17 2328 08/08/17 0523 08/09/17 0527 08/11/17 0545  WBC  --  23.6* 29.0* 36.2* 20.6*  NEUTROABS  --  22.0*  --   --   --   HGB 9.9* 7.6* 7.4* 7.5* 7.4*  HCT 29.0* 23.1* 21.6* 22.3* 22.0*  MCV  --  74.8* 73.2* 72.6* 71.0*  PLT  --  265 280 198 69*    Basic Metabolic  Panel: Recent Labs  Lab 08/08/17 0523  08/08/17 2000 08/09/17 0527 08/09/17 2057 08/10/17 0504 08/11/17 0545  NA 132*   < > 133* 130* 132* 134* 134*  K 3.7   < > 3.1* 2.9* 3.6 4.3 4.4  CL 102   < > 106 101 108 111 111  CO2 15*   < > 15* 13* 15* 15* 16*  GLUCOSE 335*   < > 113* 319* 137* 143* 239*  BUN 42*   < > 35* 32* 26* 25* 18  CREATININE 1.57*   < > 1.07* 1.01* 0.84 0.75 0.62  CALCIUM 6.8*   < > 7.7* 7.1* 7.3* 7.1* 7.5*  MG 1.2*  --   --  1.2*  --  1.6*  --   PHOS 2.0*  --   --   --   --   --   --    < > =  values in this interval not displayed.    Recent Results (from the past 240 hour(s))  Urine culture     Status: Abnormal   Collection Time: 08/07/17  9:29 PM  Result Value Ref Range Status   Specimen Description   Final    URINE, CATHETERIZED Performed at [place] [address] [place], [place] [address]    Special Requests   Final    NONE Performed at [facility], [place] [address], [place], [place] [address]    Culture MULTIPLE SPECIES PRESENT, SUGGEST RECOLLECTION (A)  Final   Report Status 08/09/2017 FINAL  Final  Gastrointestinal Panel by PCR , Stool     Status: None   Collection Time: 08/07/17  9:37 PM  Result Value Ref Range Status   Campylobacter species NOT DETECTED NOT DETECTED Final   Plesimonas shigelloides NOT DETECTED NOT DETECTED Final   Salmonella species NOT DETECTED NOT DETECTED Final   Yersinia enterocolitica NOT DETECTED NOT DETECTED Final   Vibrio species NOT DETECTED NOT DETECTED Final   Vibrio cholerae NOT DETECTED NOT DETECTED Final   Enteroaggregative E coli (EAEC) NOT DETECTED NOT DETECTED Final   Enteropathogenic E coli (EPEC) NOT DETECTED NOT DETECTED Final   Enterotoxigenic E coli (ETEC) NOT DETECTED NOT DETECTED Final   Shiga like toxin producing E coli (STEC) NOT DETECTED NOT DETECTED Final   Shigella/Enteroinvasive E coli (EIEC) NOT DETECTED NOT DETECTED Final   Cryptosporidium NOT  DETECTED NOT DETECTED Final   Cyclospora cayetanensis NOT DETECTED NOT DETECTED Final   Entamoeba histolytica NOT DETECTED NOT DETECTED Final   Giardia lamblia NOT DETECTED NOT DETECTED Final   Adenovirus F40/41 NOT DETECTED NOT DETECTED Final   Astrovirus NOT DETECTED NOT DETECTED Final   Norovirus GI/GII NOT DETECTED NOT DETECTED Final   Rotavirus A NOT DETECTED NOT DETECTED Final   Sapovirus (I, II, IV, and V) NOT DETECTED NOT DETECTED Final    Comment: Performed at [facility], [place] [place], [place] [address]  Blood Culture (routine x 2)     Status: Abnormal   Collection Time: 08/07/17  9:57 PM  Result Value Ref Range Status   Specimen Description   Final    BLOOD RIGHT HAND Performed at [facility], [place] [address] [place], [place] [address]    Special Requests   Final    BOTTLES DRAWN AEROBIC AND ANAEROBIC Blood Culture adequate volume   Culture  Setup Time   Final    GRAM NEGATIVE RODS IN BOTH AEROBIC AND ANAEROBIC BOTTLES CRITICAL RESULT CALLED TO, READ BACK BY AND VERIFIED WITH: Christean Grief Pharm.D. 13:45 08/08/17 ([email or web address]) Performed at [facility], [place] [address], [place], [place] [address]    Culture PROTEUS MIRABILIS (A)  Final   Report Status 08/10/2017 FINAL  Final   Organism ID, Bacteria PROTEUS MIRABILIS  Final      Susceptibility   Proteus mirabilis - MIC*    AMPICILLIN <=2 SENSITIVE Sensitive     CEFAZOLIN <=4 SENSITIVE Sensitive     CEFEPIME <=1 SENSITIVE Sensitive     CEFTAZIDIME <=1 SENSITIVE Sensitive     CEFTRIAXONE <=1 SENSITIVE Sensitive     CIPROFLOXACIN 2 INTERMEDIATE Intermediate     GENTAMICIN <=1 SENSITIVE Sensitive     IMIPENEM 2 SENSITIVE Sensitive     TRIMETH/SULFA <=20 SENSITIVE Sensitive     AMPICILLIN/SULBACTAM <=2 SENSITIVE Sensitive     PIP/TAZO <=4 SENSITIVE Sensitive     *  PROTEUS MIRABILIS  Blood Culture ID Panel (Reflexed)     Status: Abnormal   Collection Time: 08/07/17  9:57 PM  Result  Value Ref Range Status   Enterococcus species NOT DETECTED NOT DETECTED Final   Listeria monocytogenes NOT DETECTED NOT DETECTED Final   Staphylococcus species NOT DETECTED NOT DETECTED Final   Staphylococcus aureus NOT DETECTED NOT DETECTED Final   Streptococcus species NOT DETECTED NOT DETECTED Final   Streptococcus agalactiae NOT DETECTED NOT DETECTED Final   Streptococcus pneumoniae NOT DETECTED NOT DETECTED Final   Streptococcus pyogenes NOT DETECTED NOT DETECTED Final   Acinetobacter baumannii NOT DETECTED NOT DETECTED Final   Enterobacteriaceae species DETECTED (A) NOT DETECTED Final    Comment: Enterobacteriaceae represent a large family of gram-negative bacteria, not a single organism. CRITICAL RESULT CALLED TO, READ BACK BY AND VERIFIED WITH: Christean Grief Pharm.D. 13:45 08/08/17 ([email or web address])    Enterobacter cloacae complex NOT DETECTED NOT DETECTED Final   Escherichia coli NOT DETECTED NOT DETECTED Final   Klebsiella oxytoca NOT DETECTED NOT DETECTED Final   Klebsiella pneumoniae NOT DETECTED NOT DETECTED Final   Proteus species DETECTED (A) NOT DETECTED Final    Comment: CRITICAL RESULT CALLED TO, READ BACK BY AND VERIFIED WITH: Mila Merry.D. 13:45 08/08/17 ([email or web address])    Serratia marcescens NOT DETECTED NOT DETECTED Final   Carbapenem resistance NOT DETECTED NOT DETECTED Final   Haemophilus influenzae NOT DETECTED NOT DETECTED Final   Neisseria meningitidis NOT DETECTED NOT DETECTED Final   Pseudomonas aeruginosa NOT DETECTED NOT DETECTED Final   Candida albicans NOT DETECTED NOT DETECTED Final   Candida glabrata NOT DETECTED NOT DETECTED Final   Candida krusei NOT DETECTED NOT DETECTED Final   Candida parapsilosis NOT DETECTED NOT DETECTED Final   Candida tropicalis NOT DETECTED NOT DETECTED Final  Blood Culture (routine x 2)     Status: None (Preliminary result)   Collection Time: 08/07/17 10:48 PM  Result Value Ref Range Status   Specimen Description   Final    BLOOD  BLOOD RIGHT FOREARM Performed at [facility], [place] [address] [place], [place] [address]    Special Requests   Final    BOTTLES DRAWN AEROBIC AND ANAEROBIC Blood Culture results may not be optimal due to an inadequate volume of blood received in culture bottles   Culture   Final    NO GROWTH 2 DAYS Performed at [facility], [place] [address], [place], [place] [address]    Report Status PENDING  Incomplete  MRSA PCR Screening     Status: Abnormal   Collection Time: 08/08/17 12:40 AM  Result Value Ref Range Status   MRSA by PCR POSITIVE (A) NEGATIVE Final    Comment:        The GeneXpert MRSA Assay (FDA approved for NASAL specimens only), is one component of a comprehensive MRSA colonization surveillance program. It is not intended to diagnose MRSA infection nor to guide or monitor treatment for MRSA infections. RESULT CALLED TO, READ BACK BY AND VERIFIED WITH: LACIVITA,H RN 2.20.19 @346  ZANDO,C Performed at [facility], [place] [address], [place], [place] [address]   C difficile quick scan w PCR reflex     Status: Abnormal   Collection Time: 08/10/17  9:00 PM  Result Value Ref Range Status   C Diff antigen POSITIVE (A) NEGATIVE Final   C Diff toxin NEGATIVE NEGATIVE Final   C Diff interpretation Results are indeterminate. See PCR results.  Final  Comment: Performed at [facility], [place] [address], [place], [place] [address]  C. Diff by PCR, Reflexed     Status: None   Collection Time: 08/10/17  9:00 PM  Result Value Ref Range Status   Toxigenic C. Difficile by PCR NEGATIVE NEGATIVE Final    Comment: Patient is colonized with non toxigenic C. difficile. May not need treatment unless significant symptoms are present. Performed at [facility], [place] [address] [place], [place] [address]      Liver Function Tests: Recent Labs  Lab 08/07/17 2328  AST 69*  ALT 27  ALKPHOS 107  BILITOT 1.3*  PROT 5.9*   ALBUMIN 2.3*   No results for input(s): LIPASE, AMYLASE in the last 168 hours. No results for input(s): AMMONIA in the last 168 hours.  Cardiac Enzymes: No results for input(s): CKTOTAL, CKMB, CKMBINDEX, TROPONINI in the last 168 hours. BNP (last 3 results) Recent Labs    08/07/17 2129  BNP 96.8    ProBNP (last 3 results) No results for input(s): PROBNP in the last 8760 hours.    Studies: Dg Chest 1 View  Result Date: 08/10/2017 CLINICAL DATA:  Onset tachypnea - diabetic - hx hypertension - hx pulmonary embolism - former smoker EXAM: CHEST  1 VIEW COMPARISON:  08/09/2017 FINDINGS: Bibasilar airspace opacities left greater than right, stable. Suspect small left pleural effusion as before. Heart size upper limits normal. Aortic Atherosclerosis (ICD10-170.0) Stable right IJ central venous catheter.  No pneumothorax. Visualized bones unremarkable. IMPRESSION: Stable appearance of bibasilar airspace disease, left greater than right, and small left effusion. Electronically Signed   By: Lucrezia Europe M.D.   On: 08/10/2017 19:43    Scheduled Meds: . apixaban  2.5 mg Oral BID  . carvedilol  3.125 mg Oral BID WC  . Chlorhexidine Gluconate Cloth  6 each Topical Q0600  . famotidine  20 mg Oral BID  . feeding supplement  1 Container Oral TID BM  . insulin aspart  0-20 Units Subcutaneous TID WC  . insulin aspart  4 Units Subcutaneous TID WC  . insulin glargine  20 Units Subcutaneous Daily  . multivitamin with minerals  1 tablet Oral Daily  . mupirocin ointment  1 application Nasal BID  . nystatin   Topical BID  . potassium chloride  40 mEq Oral BID      Time spent: 25 min  [facility] Pager [phone]. If 7PM-7AM, please contact night-coverage at [email or web address], Office  [phone]  password [facility]  08/11/2017, 9:54 AM  LOS: 3 days

## 2017-08-11 NOTE — Progress Notes (Signed)
ANTICOAGULATION CONSULT NOTE - [place] for Apixaban Indication: Hx of PE (5/17)  Allergies  Allergen Reactions  . Sulfa Antibiotics Shortness Of Breath and Swelling    Patient Measurements: Height: 5\' 3"  (160 cm) Weight: 191 lb 9.3 oz (86.9 kg) IBW/kg (Calculated) : 52.4   Vital Signs: Temp: 98.8 F (37.1 C) (02/23 0800) Temp Source: Oral (02/23 0800) BP: 118/50 (02/23 0900) Pulse Rate: 94 (02/23 0900)  Labs: Recent Labs    08/09/17 0527 08/09/17 2057 08/10/17 0504 08/11/17 0545  HGB 7.5*  --   --  7.4*  HCT 22.3*  --   --  22.0*  PLT 198  --   --  69*  CREATININE 1.01* 0.84 0.75 0.62    Estimated Creatinine Clearance: 76.2 mL/min (by C-G formula based on SCr of 0.62 mg/dL).   Medical History: Past Medical History:  Diagnosis Date  . Anemia   . DM II (diabetes mellitus, type II), controlled ([facility])   . Gait disorder   . HTN (hypertension)   . Lymphedema   . MS (multiple sclerosis) ([place])   . Pulmonary embolism ([facility])     Medications:  Scheduled:  . apixaban  2.5 mg Oral BID  . carvedilol  3.125 mg Oral BID WC  . Chlorhexidine Gluconate Cloth  6 each Topical Q0600  . famotidine  20 mg Oral BID  . feeding supplement  1 Container Oral TID BM  . insulin aspart  0-20 Units Subcutaneous TID WC  . insulin aspart  4 Units Subcutaneous TID WC  . insulin glargine  20 Units Subcutaneous Daily  . multivitamin with minerals  1 tablet Oral Daily  . mupirocin ointment  1 application Nasal BID  . nystatin   Topical BID  . potassium chloride  40 mEq Oral BID   Infusions:  . sodium chloride    . norepinephrine Stopped (08/09/17 0830)  . piperacillin-tazobactam (ZOSYN)  IV 3.375 g (08/11/17 0957)    Assessment: 61 yoFwith hx of DVT/PE May of 2017. Patient was on full dose apixaban until 1/18 when admitted with acute upper GI bleed. At discharge Apixaban was resumed at 2.5 mg bid for prophylaxis against VTE recurrence.   Goal of Therapy:   Prevent VTE recurrence    Plan:   Apixaban 2.5 mg bid   Monitor for signs and symptoms of bleeding    As the above dosage is a continuation of previously prescribed dose  on discharge of last admission due to bleeding issues, will sign off doing protocol at this time.  Please reconsult if a change in clinical status warrants re-evaluation of dosage.      Royetta Asal, PharmD, BCPS Pager [phone] 08/11/2017 10:43 AM

## 2017-08-12 LAB — GLUCOSE, CAPILLARY
GLUCOSE-CAPILLARY: 114 mg/dL — AB (ref 65–99)
GLUCOSE-CAPILLARY: 85 mg/dL (ref 65–99)
GLUCOSE-CAPILLARY: 129 mg/dL — AB (ref 65–99)
Glucose-Capillary: 163 mg/dL — ABNORMAL HIGH (ref 65–99)
Glucose-Capillary: 212 mg/dL — ABNORMAL HIGH (ref 65–99)
Glucose-Capillary: 119 mg/dL — ABNORMAL HIGH (ref 65–99)

## 2017-08-12 LAB — CBC
HEMATOCRIT: 20.2 % — AB (ref 36.0–46.0)
HEMOGLOBIN: 7 g/dL — AB (ref 12.0–15.0)
MCH: 24.6 pg — ABNORMAL LOW (ref 26.0–34.0)
MCHC: 34.7 g/dL (ref 30.0–36.0)
MCV: 71.1 fL — ABNORMAL LOW (ref 78.0–100.0)
Platelets: 88 10*3/uL — ABNORMAL LOW (ref 150–400)
RBC: 2.84 MIL/uL — ABNORMAL LOW (ref 3.87–5.11)
RDW: 18.2 % — AB (ref 11.5–15.5)
WBC: 22.1 10*3/uL — AB (ref 4.0–10.5)

## 2017-08-12 LAB — PREPARE RBC (CROSSMATCH)

## 2017-08-12 MED ORDER — FUROSEMIDE 10 MG/ML IJ SOLN
20.0000 mg | Freq: Once | INTRAMUSCULAR | Status: AC
  Administered 2017-08-12: 20 mg via INTRAVENOUS
  Filled 2017-08-12: qty 2

## 2017-08-12 MED ORDER — SODIUM CHLORIDE 0.9 % IV SOLN
Freq: Once | INTRAVENOUS | Status: AC
  Administered 2017-08-12: 18:00:00 via INTRAVENOUS

## 2017-08-12 NOTE — Progress Notes (Signed)
During the first fifteen minutes of the patient receiving blood the pump began to alarm there was a three minute stop time while the error message was corrected.  The fifteen minute VS were taken at 1844 to accommodate for the time the blood was not infusing.

## 2017-08-12 NOTE — Progress Notes (Signed)
[facility]  PROGRESS NOTE  Jocelyn Sanchez FHL:[id number] DOB: Jun 16, 1956 DOA: 08/07/2017 PCP: Dixie Dials, MD   Brief HPI:   62 year old female with a history of MS causing her to be bedridden, diabetes mellitus, hypertension, pulmonary embolism came to ED with complaints of nausea vomiting and diarrhea, patient was found to be in hypothalamic shock. Despite aggressive fluid resuscitation patient remained hypotensive she was started on broad spectrum antibiotics empirically for possible septic shock and admitted to ICU [facility] service. Patient was started on with suppressor support. At this time patient is off his oppressors and has been transferred to hospital service. Hospitalist care resumed on August 09, 2017.   Subjective   Patient seen and examined, complains of being thirsty this morning.   Assessment/Plan:     1. Septic shock- resolved, off pressors-UTI versus pneumonia. Blood cultures growing gram-negative rods, Proteus mirabilis. Patient is now afebrile after starting on IV Zosyn , WBC is 22,000. Blood pressure is stable. 2. Sacral decubitus ulcer- continue wound care. 3. Diarrhea-resolved, patient had one BM which was formed. C. diff antigen is positive, toxin is negative would not treat at this time. GI pathogen panel is negative. 4. Multiple sclerosis-patient is not on any treatment for MS. Diabetes mellitus-blood glucose is better controlled after changing  Lantus to 20 units sub Q daily, continue sliding scale insulin with NovoLog.  CBG (last 3)  Recent Labs    08/11/17 2211 08/12/17 0906 08/12/17 1246  GLUCAP 85 163* 212*   5. Hypokalemia-  Replete. 6. Anemia-FOBT  is positive, hemoglobin is 7.0. Will transfused one unit P RBC. Will given dose of Lasix 20 mg IV. Once patient is more stable she will need a G.I. evaluation. 7. Hypomagnesemia-magnesium was 1.6, magnesium was replaced. Will check serum magnesium level in a.m. 8.  Pulmonary embolism-continue  apixaban  9. Thrombocytopenia - today platelet count is 88,000, likely from antibiotics, IV Zosyn. Patient is not on Lovenox or heparin. Will repeat CBC in a.m.      DVT prophylaxis: apixaban  Code Status: partial code-patient is okay with BIPAP and Vasopressors. No intubation or cpr  Family Communication: no family at bedside  Disposition Plan: likely home in one to two days.   Consultants:  PCCM  Procedures:  none  Continuous infusions . sodium chloride    . norepinephrine Stopped (08/09/17 0830)  . piperacillin-tazobactam (ZOSYN)  IV 3.375 g (08/12/17 0949)      Antibiotics:   Anti-infectives (From admission, onward)   Start     Dose/Rate Route Frequency Ordered Stop   08/10/17 1000  piperacillin-tazobactam (ZOSYN) IVPB 3.375 g     3.375 g 12.5 mL/hr over 240 Minutes Intravenous Every 8 hours 08/10/17 0833     08/08/17 1800  vancomycin (VANCOCIN) IVPB 1000 mg/200 mL premix  Status:  Discontinued     1,000 mg 200 mL/hr over 60 Minutes Intravenous Every 48 hours 08/08/17 0109 08/08/17 1403   08/08/17 1500  cefTRIAXone (ROCEPHIN) 2 g in sodium chloride 0.9 % 100 mL IVPB  Status:  Discontinued     2 g 200 mL/hr over 30 Minutes Intravenous Every 24 hours 08/08/17 1403 08/10/17 0756   08/08/17 0130  vancomycin (VANCOCIN) 1,500 mg in sodium chloride 0.9 % 500 mL IVPB  Status:  Discontinued     1,500 mg 250 mL/hr over 120 Minutes Intravenous  Once 08/08/17 0100 08/08/17 0101   08/08/17 0115  meropenem (MERREM) 1 g in sodium chloride 0.9 % 100 mL IVPB  Status:  Discontinued     1 g 200 mL/hr over 30 Minutes Intravenous Every 8 hours 08/08/17 0100 08/08/17 1403   08/07/17 2130  piperacillin-tazobactam (ZOSYN) IVPB 3.375 g     3.375 g 100 mL/hr over 30 Minutes Intravenous  Once 08/07/17 2129 08/07/17 2224   08/07/17 2130  vancomycin (VANCOCIN) IVPB 1000 mg/200 mL premix     1,000 mg 200 mL/hr over 60 Minutes Intravenous  Once 08/07/17 2129 08/07/17 2331        Objective   Vitals:   08/12/17 0727 08/12/17 0800 08/12/17 0805 08/12/17 1100  BP: (!) 105/41  (!) 122/50 (!) 108/53  Pulse: 94   91  Resp: (!) 27   (!) 25  Temp:  98.5 F (36.9 C)    TempSrc:  Oral    SpO2: 94%   98%  Weight:      Height:        Intake/Output Summary (Last 24 hours) at 08/12/2017 1314 Last data filed at 08/12/2017 1002 Gross per 24 hour  Intake 552 ml  Output 775 ml  Net -223 ml   Filed Weights   08/10/17 0500 08/11/17 0449 08/12/17 0337  Weight: 84.4 kg (186 lb 1.1 oz) 86.9 kg (191 lb 9.3 oz) 86.1 kg (189 lb 13.1 oz)     Physical Examination:  Physical Exam: Eyes: No icterus, extraocular muscles intact  Mouth: Oral mucosa is moist, no lesions on palate,  Neck: Supple, no deformities, masses, or tenderness Lungs: Normal respiratory effort, bilateral clear to auscultation, no crackles or wheezes.  Heart: Regular rate and rhythm, S1 and S2 normal, no murmurs, rubs auscultated Abdomen: BS normoactive,soft,nondistended,non-tender to palpation,no organomegaly Extremities: No pretibial edema, no erythema, no cyanosis, no clubbing Neuro : Alert and oriented to time, place and person, No focal deficits Skin: No rashes seen on exam       Data Reviewed: I have personally reviewed following labs and imaging studies  CBG: Recent Labs  Lab 08/11/17 1202 08/11/17 1711 08/11/17 2211 08/12/17 0906 08/12/17 1246  GLUCAP 267* 214* 85 163* 212*    CBC: Recent Labs  Lab 08/07/17 2328 08/08/17 0523 08/09/17 0527 08/11/17 0545 08/12/17 0545  WBC 23.6* 29.0* 36.2* 20.6* 22.1*  NEUTROABS 22.0*  --   --   --   --   HGB 7.6* 7.4* 7.5* 7.4* 7.0*  HCT 23.1* 21.6* 22.3* 22.0* 20.2*  MCV 74.8* 73.2* 72.6* 71.0* 71.1*  PLT 265 280 198 69* 88*    Basic Metabolic Panel: Recent Labs  Lab 08/08/17 0523  08/08/17 2000 08/09/17 0527 08/09/17 2057 08/10/17 0504 08/11/17 0545  NA 132*   < > 133* 130* 132* 134* 134*  K 3.7   < > 3.1* 2.9* 3.6 4.3  4.4  CL 102   < > 106 101 108 111 111  CO2 15*   < > 15* 13* 15* 15* 16*  GLUCOSE 335*   < > 113* 319* 137* 143* 239*  BUN 42*   < > 35* 32* 26* 25* 18  CREATININE 1.57*   < > 1.07* 1.01* 0.84 0.75 0.62  CALCIUM 6.8*   < > 7.7* 7.1* 7.3* 7.1* 7.5*  MG 1.2*  --   --  1.2*  --  1.6*  --   PHOS 2.0*  --   --   --   --   --   --    < > = values in this interval not displayed.    Recent Results (from the past 240  hour(s))  Urine culture     Status: Abnormal   Collection Time: 08/07/17  9:29 PM  Result Value Ref Range Status   Specimen Description   Final    URINE, CATHETERIZED Performed at [facility], [place] [address] [place], [place] [address]    Special Requests   Final    NONE Performed at [facility], [place] [address], [place], [place] [address]    Culture MULTIPLE SPECIES PRESENT, SUGGEST RECOLLECTION (A)  Final   Report Status 08/09/2017 FINAL  Final  Gastrointestinal Panel by PCR , Stool     Status: None   Collection Time: 08/07/17  9:37 PM  Result Value Ref Range Status   Campylobacter species NOT DETECTED NOT DETECTED Final   Plesimonas shigelloides NOT DETECTED NOT DETECTED Final   Salmonella species NOT DETECTED NOT DETECTED Final   Yersinia enterocolitica NOT DETECTED NOT DETECTED Final   Vibrio species NOT DETECTED NOT DETECTED Final   Vibrio cholerae NOT DETECTED NOT DETECTED Final   Enteroaggregative E coli (EAEC) NOT DETECTED NOT DETECTED Final   Enteropathogenic E coli (EPEC) NOT DETECTED NOT DETECTED Final   Enterotoxigenic E coli (ETEC) NOT DETECTED NOT DETECTED Final   Shiga like toxin producing E coli (STEC) NOT DETECTED NOT DETECTED Final   Shigella/Enteroinvasive E coli (EIEC) NOT DETECTED NOT DETECTED Final   Cryptosporidium NOT DETECTED NOT DETECTED Final   Cyclospora cayetanensis NOT DETECTED NOT DETECTED Final   Entamoeba histolytica NOT DETECTED NOT DETECTED Final   Giardia lamblia NOT DETECTED NOT DETECTED Final    Adenovirus F40/41 NOT DETECTED NOT DETECTED Final   Astrovirus NOT DETECTED NOT DETECTED Final   Norovirus GI/GII NOT DETECTED NOT DETECTED Final   Rotavirus A NOT DETECTED NOT DETECTED Final   Sapovirus (I, II, IV, and V) NOT DETECTED NOT DETECTED Final    Comment: Performed at [facility], [place] [place], [place] [address]  Blood Culture (routine x 2)     Status: Abnormal   Collection Time: 08/07/17  9:57 PM  Result Value Ref Range Status   Specimen Description   Final    BLOOD RIGHT HAND Performed at [facility], [place] [address], [place], [place] [address]    Special Requests   Final    BOTTLES DRAWN AEROBIC AND ANAEROBIC Blood Culture adequate volume   Culture  Setup Time   Final    GRAM NEGATIVE RODS IN BOTH AEROBIC AND ANAEROBIC BOTTLES CRITICAL RESULT CALLED TO, READ BACK BY AND VERIFIED WITH: Christean Grief Pharm.D. 13:45 08/08/17 ([email or web address]) Performed at [facility], [place] [address] [place], [place] [address]    Culture PROTEUS MIRABILIS (A)  Final   Report Status 08/10/2017 FINAL  Final   Organism ID, Bacteria PROTEUS MIRABILIS  Final      Susceptibility   Proteus mirabilis - MIC*    AMPICILLIN <=2 SENSITIVE Sensitive     CEFAZOLIN <=4 SENSITIVE Sensitive     CEFEPIME <=1 SENSITIVE Sensitive     CEFTAZIDIME <=1 SENSITIVE Sensitive     CEFTRIAXONE <=1 SENSITIVE Sensitive     CIPROFLOXACIN 2 INTERMEDIATE Intermediate     GENTAMICIN <=1 SENSITIVE Sensitive     IMIPENEM 2 SENSITIVE Sensitive     TRIMETH/SULFA <=20 SENSITIVE Sensitive     AMPICILLIN/SULBACTAM <=2 SENSITIVE Sensitive     PIP/TAZO <=4 SENSITIVE Sensitive     * PROTEUS MIRABILIS  Blood Culture ID Panel (Reflexed)     Status: Abnormal  Collection Time: 08/07/17  9:57 PM  Result Value Ref Range Status   Enterococcus species NOT DETECTED NOT DETECTED Final   Listeria monocytogenes NOT DETECTED NOT DETECTED Final   Staphylococcus species NOT DETECTED NOT DETECTED Final    Staphylococcus aureus NOT DETECTED NOT DETECTED Final   Streptococcus species NOT DETECTED NOT DETECTED Final   Streptococcus agalactiae NOT DETECTED NOT DETECTED Final   Streptococcus pneumoniae NOT DETECTED NOT DETECTED Final   Streptococcus pyogenes NOT DETECTED NOT DETECTED Final   Acinetobacter baumannii NOT DETECTED NOT DETECTED Final   Enterobacteriaceae species DETECTED (A) NOT DETECTED Final    Comment: Enterobacteriaceae represent a large family of gram-negative bacteria, not a single organism. CRITICAL RESULT CALLED TO, READ BACK BY AND VERIFIED WITH: Christean Grief Pharm.D. 13:45 08/08/17 ([email or web address])    Enterobacter cloacae complex NOT DETECTED NOT DETECTED Final   Escherichia coli NOT DETECTED NOT DETECTED Final   Klebsiella oxytoca NOT DETECTED NOT DETECTED Final   Klebsiella pneumoniae NOT DETECTED NOT DETECTED Final   Proteus species DETECTED (A) NOT DETECTED Final    Comment: CRITICAL RESULT CALLED TO, READ BACK BY AND VERIFIED WITH: Mila Merry.D. 13:45 08/08/17 ([email or web address])    Serratia marcescens NOT DETECTED NOT DETECTED Final   Carbapenem resistance NOT DETECTED NOT DETECTED Final   Haemophilus influenzae NOT DETECTED NOT DETECTED Final   Neisseria meningitidis NOT DETECTED NOT DETECTED Final   Pseudomonas aeruginosa NOT DETECTED NOT DETECTED Final   Candida albicans NOT DETECTED NOT DETECTED Final   Candida glabrata NOT DETECTED NOT DETECTED Final   Candida krusei NOT DETECTED NOT DETECTED Final   Candida parapsilosis NOT DETECTED NOT DETECTED Final   Candida tropicalis NOT DETECTED NOT DETECTED Final  Blood Culture (routine x 2)     Status: None (Preliminary result)   Collection Time: 08/07/17 10:48 PM  Result Value Ref Range Status   Specimen Description   Final    BLOOD BLOOD RIGHT FOREARM Performed at [facility], [place] [address] [place], [place] [address]    Special Requests   Final    BOTTLES DRAWN AEROBIC AND ANAEROBIC Blood Culture  results may not be optimal due to an inadequate volume of blood received in culture bottles   Culture   Final    NO GROWTH 3 DAYS Performed at [facility], [place] [address] [place], [place] [address]    Report Status PENDING  Incomplete  MRSA PCR Screening     Status: Abnormal   Collection Time: 08/08/17 12:40 AM  Result Value Ref Range Status   MRSA by PCR POSITIVE (A) NEGATIVE Final    Comment:        The GeneXpert MRSA Assay (FDA approved for NASAL specimens only), is one component of a comprehensive MRSA colonization surveillance program. It is not intended to diagnose MRSA infection nor to guide or monitor treatment for MRSA infections. RESULT CALLED TO, READ BACK BY AND VERIFIED WITH: LACIVITA,H RN 2.20.19 @346  ZANDO,C Performed at [facility], [place] [address] [place], [place] [address]   C difficile quick scan w PCR reflex     Status: Abnormal   Collection Time: 08/10/17  9:00 PM  Result Value Ref Range Status   C Diff antigen POSITIVE (A) NEGATIVE Final   C Diff toxin NEGATIVE NEGATIVE Final   C Diff interpretation Results are indeterminate. See PCR results.  Final    Comment: Performed at [facility], [place] [address], [place], [place] [address]  C. Diff by PCR, Reflexed     Status: None   Collection Time: 08/10/17  9:00 PM  Result Value Ref Range Status   Toxigenic C. Difficile by PCR NEGATIVE NEGATIVE Final    Comment: Patient is colonized with non toxigenic C. difficile. May not need treatment unless significant symptoms are present. Performed at [facility], [place] [address], [place], [place] [address]      Liver Function Tests: Recent Labs  Lab 08/07/17 2328  AST 69*  ALT 27  ALKPHOS 107  BILITOT 1.3*  PROT 5.9*  ALBUMIN 2.3*   No results for input(s): LIPASE, AMYLASE in the last 168 hours. No results for input(s): AMMONIA in the last 168 hours.  Cardiac Enzymes: No results for input(s): CKTOTAL,  CKMB, CKMBINDEX, TROPONINI in the last 168 hours. BNP (last 3 results) Recent Labs    08/07/17 2129  BNP 96.8    ProBNP (last 3 results) No results for input(s): PROBNP in the last 8760 hours.    Studies: Dg Chest 1 View  Result Date: 08/10/2017 CLINICAL DATA:  Onset tachypnea - diabetic - hx hypertension - hx pulmonary embolism - former smoker EXAM: CHEST  1 VIEW COMPARISON:  08/09/2017 FINDINGS: Bibasilar airspace opacities left greater than right, stable. Suspect small left pleural effusion as before. Heart size upper limits normal. Aortic Atherosclerosis (ICD10-170.0) Stable right IJ central venous catheter.  No pneumothorax. Visualized bones unremarkable. IMPRESSION: Stable appearance of bibasilar airspace disease, left greater than right, and small left effusion. Electronically Signed   By: Lucrezia Europe M.D.   On: 08/10/2017 19:43    Scheduled Meds: . apixaban  2.5 mg Oral BID  . carvedilol  3.125 mg Oral BID WC  . famotidine  20 mg Oral BID  . feeding supplement  1 Container Oral TID BM  . insulin aspart  0-20 Units Subcutaneous TID WC  . insulin aspart  4 Units Subcutaneous TID WC  . insulin glargine  20 Units Subcutaneous Daily  . multivitamin with minerals  1 tablet Oral Daily  . mupirocin ointment  1 application Nasal BID  . nystatin   Topical BID  . potassium chloride  40 mEq Oral BID      Time spent: 25 min  [facility] Pager [phone]. If 7PM-7AM, please contact night-coverage at [email or web address], Office  [phone]  password [email or web address]  08/12/2017, 1:14 PM  LOS: 4 days

## 2017-08-12 NOTE — Progress Notes (Signed)
During assessment patient is moaning and stating she wants to go and to leave her alone.  Completed assessment but patient refused to take any of her medications for 2200 med pass.  Tried to speak to her about the importance of taking her meds but still refused.  Also stated that her mouth is in pain, appears to be thrush and will notify Dr. To see if they want to start magic mouthwash.  Will try to admin meds later in shift.

## 2017-08-13 LAB — CULTURE, BLOOD (ROUTINE X 2): Culture: NO GROWTH

## 2017-08-13 LAB — BASIC METABOLIC PANEL
ANION GAP: 7 (ref 5–15)
BUN: 12 mg/dL (ref 6–20)
CO2: 17 mmol/L — ABNORMAL LOW (ref 22–32)
Calcium: 7.9 mg/dL — ABNORMAL LOW (ref 8.9–10.3)
Chloride: 114 mmol/L — ABNORMAL HIGH (ref 101–111)
Creatinine, Ser: 0.6 mg/dL (ref 0.44–1.00)
GFR calc Af Amer: >60 mL/min (ref >60)
GLUCOSE: 78 mg/dL (ref 65–99)
POTASSIUM: 4.6 mmol/L (ref 3.5–5.1)
Sodium: 138 mmol/L (ref 135–145)

## 2017-08-13 LAB — TYPE AND SCREEN
ABO/RH(D): O POS
Antibody Screen: NEGATIVE
Unit division: 0

## 2017-08-13 LAB — BPAM RBC
BLOOD PRODUCT EXPIRATION DATE: 201903272359
ISSUE DATE / TIME: 201902241812
Unit Type and Rh: 5100

## 2017-08-13 LAB — GLUCOSE, CAPILLARY
GLUCOSE-CAPILLARY: 153 mg/dL — AB (ref 65–99)
Glucose-Capillary: 75 mg/dL (ref 65–99)
Glucose-Capillary: 159 mg/dL — ABNORMAL HIGH (ref 65–99)

## 2017-08-13 LAB — CBC
HCT: 25.9 % — ABNORMAL LOW (ref 36.0–46.0)
HEMOGLOBIN: 8.9 g/dL — AB (ref 12.0–15.0)
MCH: 25.5 pg — ABNORMAL LOW (ref 26.0–34.0)
MCHC: 34.4 g/dL (ref 30.0–36.0)
MCV: 74.2 fL — AB (ref 78.0–100.0)
PLATELETS: 99 10*3/uL — AB (ref 150–400)
RBC: 3.49 MIL/uL — AB (ref 3.87–5.11)
RDW: 19.5 % — ABNORMAL HIGH (ref 11.5–15.5)
WBC: 22.3 10*3/uL — AB (ref 4.0–10.5)

## 2017-08-13 LAB — MAGNESIUM: Magnesium: 1.2 mg/dL — ABNORMAL LOW (ref 1.7–2.4)

## 2017-08-13 MED ORDER — AMOXICILLIN-POT CLAVULANATE 875-125 MG PO TABS
1.0000 | ORAL_TABLET | Freq: Two times a day (BID) | ORAL | Status: DC
  Administered 2017-08-13 – 2017-08-14 (×2): 1 via ORAL
  Filled 2017-08-13 (×3): qty 1

## 2017-08-13 MED ORDER — MAGNESIUM SULFATE 4 GM/100ML IV SOLN
4.0000 g | Freq: Once | INTRAVENOUS | Status: AC
  Administered 2017-08-13: 4 g via INTRAVENOUS
  Filled 2017-08-13: qty 100

## 2017-08-13 MED ORDER — LIP MEDEX EX OINT
TOPICAL_OINTMENT | CUTANEOUS | Status: AC
  Administered 2017-08-13: 14:00:00
  Filled 2017-08-13: qty 7

## 2017-08-13 NOTE — Progress Notes (Addendum)
[facility]  PROGRESS NOTE  Jocelyn Sanchez EUM:[id number] DOB: 19-May-1956 DOA: 08/07/2017 PCP: Dixie Dials, MD   Brief HPI:   62 year old female with a history of MS causing her to be bedridden, diabetes mellitus, hypertension, pulmonary embolism came to ED with complaints of nausea vomiting and diarrhea, patient was found to be in hypothalamic shock. Despite aggressive fluid resuscitation patient remained hypotensive she was started on broad spectrum antibiotics empirically for possible septic shock and admitted to ICU [facility] service. Patient was started on with suppressor support. At this time patient is off his oppressors and has been transferred to hospital service. Hospitalist care resumed on August 09, 2017.   Subjective   Patient seen and examined, denies any complaints this morning.   Assessment/Plan:     1. Septic shock- resolved, off pressors-UTI versus pneumonia. Blood cultures growing gram-negative rods, Proteus mirabilis. Patient is now afebrile after starting on IV Zosyn , WBC is 22,000. Blood pressure is stable.  Called and discussed with infectious disease.  Will discontinue IV Zosyn and start Augmentin 1 tablet p.o. twice daily.  Will discontinue central line. 2. Sacral decubitus ulcer- continue wound care. 3. Diarrhea-resolved, patient had one BM which was formed. C. diff antigen is positive, toxin is negative would not treat at this time. GI pathogen panel is negative. 4. Multiple sclerosis-patient is not on any treatment for MS. Diabetes mellitus-blood glucose is better controlled after changing  Lantus to 20 units sub Q daily, continue sliding scale insulin with NovoLog.  CBG (last 3)  Recent Labs    08/12/17 1621 08/12/17 2141 08/13/17 0835  GLUCAP 129* 119* 75   5. Hypokalemia-  Replete. 6. Anemia-FOBT  is positive, hemoglobin is 8.9, status post 1 unit PRBC. Once patient is more stable she will need a G.I. evaluation. 7. Hypomagnesemia-magnesium is  1.6, will replace magnesium. Will check serum magnesium level in a.m. 8.  Pulmonary embolism-continue apixaban  9. Thrombocytopenia - today platelet count is 99,000, likely from antibiotics, IV Zosyn. Patient is not on Lovenox or heparin. Will repeat CBC in a.m.      DVT prophylaxis: apixaban  Code Status: partial code-patient is okay with BIPAP and Vasopressors. No intubation or cpr  Family Communication: no family at bedside  Disposition Plan: likely home in one to two days.   Consultants:  PCCM  Procedures:  none  Continuous infusions . sodium chloride    . norepinephrine Stopped (08/09/17 0830)      Antibiotics:   Anti-infectives (From admission, onward)   Start     Dose/Rate Route Frequency Ordered Stop   08/13/17 1600  amoxicillin-clavulanate (AUGMENTIN) 875-125 MG per tablet 1 tablet     1 tablet Oral Every 12 hours 08/13/17 1203     08/10/17 1000  piperacillin-tazobactam (ZOSYN) IVPB 3.375 g  Status:  Discontinued     3.375 g 12.5 mL/hr over 240 Minutes Intravenous Every 8 hours 08/10/17 0833 08/13/17 1203   08/08/17 1800  vancomycin (VANCOCIN) IVPB 1000 mg/200 mL premix  Status:  Discontinued     1,000 mg 200 mL/hr over 60 Minutes Intravenous Every 48 hours 08/08/17 0109 08/08/17 1403   08/08/17 1500  cefTRIAXone (ROCEPHIN) 2 g in sodium chloride 0.9 % 100 mL IVPB  Status:  Discontinued     2 g 200 mL/hr over 30 Minutes Intravenous Every 24 hours 08/08/17 1403 08/10/17 0756   08/08/17 0130  vancomycin (VANCOCIN) 1,500 mg in sodium chloride 0.9 % 500 mL IVPB  Status:  Discontinued  1,500 mg 250 mL/hr over 120 Minutes Intravenous  Once 08/08/17 0100 08/08/17 0101   08/08/17 0115  meropenem (MERREM) 1 g in sodium chloride 0.9 % 100 mL IVPB  Status:  Discontinued     1 g 200 mL/hr over 30 Minutes Intravenous Every 8 hours 08/08/17 0100 08/08/17 1403   08/07/17 2130  piperacillin-tazobactam (ZOSYN) IVPB 3.375 g     3.375 g 100 mL/hr over 30 Minutes  Intravenous  Once 08/07/17 2129 08/07/17 2224   08/07/17 2130  vancomycin (VANCOCIN) IVPB 1000 mg/200 mL premix     1,000 mg 200 mL/hr over 60 Minutes Intravenous  Once 08/07/17 2129 08/07/17 2331       Objective   Vitals:   08/13/17 0700 08/13/17 0733 08/13/17 0800 08/13/17 0900  BP: (!) 121/51  (!) 120/56 (!) 129/58  Pulse: 84  87 87  Resp: 19  17 (!) 23  Temp:  98.8 F (37.1 C)    TempSrc:  Oral    SpO2: 99%  99% 97%  Weight:      Height:        Intake/Output Summary (Last 24 hours) at 08/13/2017 1210 Last data filed at 08/13/2017 1100 Gross per 24 hour  Intake 1252 ml  Output 2601 ml  Net -1349 ml   Filed Weights   08/11/17 0449 08/12/17 0337 08/13/17 0342  Weight: 86.9 kg (191 lb 9.3 oz) 86.1 kg (189 lb 13.1 oz) 83 kg (182 lb 15.7 oz)     Physical Examination:  Physical Exam: Eyes: No icterus, extraocular muscles intact  Mouth: Oral mucosa is moist, no lesions on palate,  Neck: Supple, no deformities, masses, or tenderness Lungs: Normal respiratory effort, bilateral clear to auscultation, no crackles or wheezes.  Heart: Regular rate and rhythm, S1 and S2 normal, no murmurs, rubs auscultated Abdomen: BS normoactive,soft,nondistended,non-tender to palpation,no organomegaly Extremities: No pretibial edema, no erythema, no cyanosis, no clubbing Neuro : Alert and oriented to time, place and person, No focal deficits Skin: No rashes seen on exam    Data Reviewed: I have personally reviewed following labs and imaging studies  CBG: Recent Labs  Lab 08/12/17 0906 08/12/17 1246 08/12/17 1621 08/12/17 2141 08/13/17 0835  GLUCAP 163* 212* 129* 119* 75    CBC: Recent Labs  Lab 08/07/17 2328 08/08/17 0523 08/09/17 0527 08/11/17 0545 08/12/17 0545 08/13/17 0506  WBC 23.6* 29.0* 36.2* 20.6* 22.1* 22.3*  NEUTROABS 22.0*  --   --   --   --   --   HGB 7.6* 7.4* 7.5* 7.4* 7.0* 8.9*  HCT 23.1* 21.6* 22.3* 22.0* 20.2* 25.9*  MCV 74.8* 73.2* 72.6* 71.0* 71.1*  74.2*  PLT 265 280 198 69* 88* 99*    Basic Metabolic Panel: Recent Labs  Lab 08/08/17 0523  08/09/17 0527 08/09/17 2057 08/10/17 0504 08/11/17 0545 08/13/17 0506  NA 132*   < > 130* 132* 134* 134* 138  K 3.7   < > 2.9* 3.6 4.3 4.4 4.6  CL 102   < > 101 108 111 111 114*  CO2 15*   < > 13* 15* 15* 16* 17*  GLUCOSE 335*   < > 319* 137* 143* 239* 78  BUN 42*   < > 32* 26* 25* 18 12  CREATININE 1.57*   < > 1.01* 0.84 0.75 0.62 0.60  CALCIUM 6.8*   < > 7.1* 7.3* 7.1* 7.5* 7.9*  MG 1.2*  --  1.2*  --  1.6*  --  1.2*  PHOS 2.0*  --   --   --   --   --   --    < > =  values in this interval not displayed.    Recent Results (from the past 240 hour(s))  Urine culture     Status: Abnormal   Collection Time: 08/07/17  9:29 PM  Result Value Ref Range Status   Specimen Description   Final    URINE, CATHETERIZED Performed at [facility] [address], [place], [place] [address]    Special Requests   Final    NONE Performed at [facility], [place] [address] [place], [place] [address]    Culture MULTIPLE SPECIES PRESENT, SUGGEST RECOLLECTION (A)  Final   Report Status 08/09/2017 FINAL  Final  Gastrointestinal Panel by PCR , Stool     Status: None   Collection Time: 08/07/17  9:37 PM  Result Value Ref Range Status   Campylobacter species NOT DETECTED NOT DETECTED Final   Plesimonas shigelloides NOT DETECTED NOT DETECTED Final   Salmonella species NOT DETECTED NOT DETECTED Final   Yersinia enterocolitica NOT DETECTED NOT DETECTED Final   Vibrio species NOT DETECTED NOT DETECTED Final   Vibrio cholerae NOT DETECTED NOT DETECTED Final   Enteroaggregative E coli (EAEC) NOT DETECTED NOT DETECTED Final   Enteropathogenic E coli (EPEC) NOT DETECTED NOT DETECTED Final   Enterotoxigenic E coli (ETEC) NOT DETECTED NOT DETECTED Final   Shiga like toxin producing E coli (STEC) NOT DETECTED NOT DETECTED Final   Shigella/Enteroinvasive E coli (EIEC) NOT  DETECTED NOT DETECTED Final   Cryptosporidium NOT DETECTED NOT DETECTED Final   Cyclospora cayetanensis NOT DETECTED NOT DETECTED Final   Entamoeba histolytica NOT DETECTED NOT DETECTED Final   Giardia lamblia NOT DETECTED NOT DETECTED Final   Adenovirus F40/41 NOT DETECTED NOT DETECTED Final   Astrovirus NOT DETECTED NOT DETECTED Final   Norovirus GI/GII NOT DETECTED NOT DETECTED Final   Rotavirus A NOT DETECTED NOT DETECTED Final   Sapovirus (I, II, IV, and V) NOT DETECTED NOT DETECTED Final    Comment: Performed at [facility], [place] [place], [place] [address]  Blood Culture (routine x 2)     Status: Abnormal   Collection Time: 08/07/17  9:57 PM  Result Value Ref Range Status   Specimen Description   Final    BLOOD RIGHT HAND Performed at [facility], [place] [address] [place], [place] [address]    Special Requests   Final    BOTTLES DRAWN AEROBIC AND ANAEROBIC Blood Culture adequate volume   Culture  Setup Time   Final    GRAM NEGATIVE RODS IN BOTH AEROBIC AND ANAEROBIC BOTTLES CRITICAL RESULT CALLED TO, READ BACK BY AND VERIFIED WITH: Christean Grief Pharm.D. 13:45 08/08/17 ([email or web address]) Performed at [facility], [place] [address], [place], [place] [address]    Culture PROTEUS MIRABILIS (A)  Final   Report Status 08/10/2017 FINAL  Final   Organism ID, Bacteria PROTEUS MIRABILIS  Final      Susceptibility   Proteus mirabilis - MIC*    AMPICILLIN <=2 SENSITIVE Sensitive     CEFAZOLIN <=4 SENSITIVE Sensitive     CEFEPIME <=1 SENSITIVE Sensitive     CEFTAZIDIME <=1 SENSITIVE Sensitive     CEFTRIAXONE <=1 SENSITIVE Sensitive     CIPROFLOXACIN 2 INTERMEDIATE Intermediate     GENTAMICIN <=1 SENSITIVE Sensitive     IMIPENEM 2 SENSITIVE Sensitive     TRIMETH/SULFA <=20 SENSITIVE Sensitive     AMPICILLIN/SULBACTAM <=2 SENSITIVE Sensitive     PIP/TAZO <=4 SENSITIVE Sensitive     *  PROTEUS MIRABILIS  Blood Culture ID Panel (Reflexed)     Status:  Abnormal   Collection Time: 08/07/17  9:57 PM  Result Value Ref Range Status   Enterococcus species NOT DETECTED NOT DETECTED Final   Listeria monocytogenes NOT DETECTED NOT DETECTED Final   Staphylococcus species NOT DETECTED NOT DETECTED Final   Staphylococcus aureus NOT DETECTED NOT DETECTED Final   Streptococcus species NOT DETECTED NOT DETECTED Final   Streptococcus agalactiae NOT DETECTED NOT DETECTED Final   Streptococcus pneumoniae NOT DETECTED NOT DETECTED Final   Streptococcus pyogenes NOT DETECTED NOT DETECTED Final   Acinetobacter baumannii NOT DETECTED NOT DETECTED Final   Enterobacteriaceae species DETECTED (A) NOT DETECTED Final    Comment: Enterobacteriaceae represent a large family of gram-negative bacteria, not a single organism. CRITICAL RESULT CALLED TO, READ BACK BY AND VERIFIED WITH: Christean Grief Pharm.D. 13:45 08/08/17 ([email or web address])    Enterobacter cloacae complex NOT DETECTED NOT DETECTED Final   Escherichia coli NOT DETECTED NOT DETECTED Final   Klebsiella oxytoca NOT DETECTED NOT DETECTED Final   Klebsiella pneumoniae NOT DETECTED NOT DETECTED Final   Proteus species DETECTED (A) NOT DETECTED Final    Comment: CRITICAL RESULT CALLED TO, READ BACK BY AND VERIFIED WITH: Mila Merry.D. 13:45 08/08/17 ([email or web address])    Serratia marcescens NOT DETECTED NOT DETECTED Final   Carbapenem resistance NOT DETECTED NOT DETECTED Final   Haemophilus influenzae NOT DETECTED NOT DETECTED Final   Neisseria meningitidis NOT DETECTED NOT DETECTED Final   Pseudomonas aeruginosa NOT DETECTED NOT DETECTED Final   Candida albicans NOT DETECTED NOT DETECTED Final   Candida glabrata NOT DETECTED NOT DETECTED Final   Candida krusei NOT DETECTED NOT DETECTED Final   Candida parapsilosis NOT DETECTED NOT DETECTED Final   Candida tropicalis NOT DETECTED NOT DETECTED Final  Blood Culture (routine x 2)     Status: None (Preliminary result)   Collection Time: 08/07/17 10:48 PM  Result Value Ref  Range Status   Specimen Description   Final    BLOOD BLOOD RIGHT FOREARM Performed at [facility], [place] [address], [place], [place] [address]    Special Requests   Final    BOTTLES DRAWN AEROBIC AND ANAEROBIC Blood Culture results may not be optimal due to an inadequate volume of blood received in culture bottles   Culture   Final    NO GROWTH 4 DAYS Performed at [facility], [place] [address], [place], [place] [address]    Report Status PENDING  Incomplete  MRSA PCR Screening     Status: Abnormal   Collection Time: 08/08/17 12:40 AM  Result Value Ref Range Status   MRSA by PCR POSITIVE (A) NEGATIVE Final    Comment:        The GeneXpert MRSA Assay (FDA approved for NASAL specimens only), is one component of a comprehensive MRSA colonization surveillance program. It is not intended to diagnose MRSA infection nor to guide or monitor treatment for MRSA infections. RESULT CALLED TO, READ BACK BY AND VERIFIED WITH: LACIVITA,H RN 2.20.19 @346  ZANDO,C Performed at [facility], [place] [address], [place], [place] [address]   C difficile quick scan w PCR reflex     Status: Abnormal   Collection Time: 08/10/17  9:00 PM  Result Value Ref Range Status   C Diff antigen POSITIVE (A) NEGATIVE Final   C Diff toxin NEGATIVE NEGATIVE Final   C Diff interpretation Results are indeterminate. See PCR results.  Final  Comment: Performed at [facility], [place] [address] [place], [place] [address]  C. Diff by PCR, Reflexed     Status: None   Collection Time: 08/10/17  9:00 PM  Result Value Ref Range Status   Toxigenic C. Difficile by PCR NEGATIVE NEGATIVE Final    Comment: Patient is colonized with non toxigenic C. difficile. May not need treatment unless significant symptoms are present. Performed at [facility], [place] [address], [place], [place] [address]      Liver Function Tests: Recent Labs  Lab 08/07/17 2328  AST 69*   ALT 27  ALKPHOS 107  BILITOT 1.3*  PROT 5.9*  ALBUMIN 2.3*   No results for input(s): LIPASE, AMYLASE in the last 168 hours. No results for input(s): AMMONIA in the last 168 hours.  Cardiac Enzymes: No results for input(s): CKTOTAL, CKMB, CKMBINDEX, TROPONINI in the last 168 hours. BNP (last 3 results) Recent Labs    08/07/17 2129  BNP 96.8    ProBNP (last 3 results) No results for input(s): PROBNP in the last 8760 hours.    Studies: No results found.  Scheduled Meds: . amoxicillin-clavulanate  1 tablet Oral Q12H  . apixaban  2.5 mg Oral BID  . carvedilol  3.125 mg Oral BID WC  . famotidine  20 mg Oral BID  . feeding supplement  1 Container Oral TID BM  . insulin aspart  0-20 Units Subcutaneous TID WC  . insulin aspart  4 Units Subcutaneous TID WC  . insulin glargine  20 Units Subcutaneous Daily  . lip balm      . multivitamin with minerals  1 tablet Oral Daily  . nystatin   Topical BID  . potassium chloride  40 mEq Oral BID      Time spent: 25 min  [facility] Pager [phone]. If 7PM-7AM, please contact night-coverage at [email or web address], Office  [phone]  password [email or web address]  08/13/2017, 12:10 PM  LOS: 5 days

## 2017-08-13 NOTE — Consult Note (Signed)
[facility] Nurse wound consult note Reason for Consult:New deep tissue pressure injury to left medial heel. Wound type:Pressure Pressure Injury POA: No Measurement:2.5cm x 3cm  Wound IDC:VUDT purple/maroon dis discoloration that does not blanch. Not open, no wound bed. Drainage (amount, consistency, odor) None Periwound:intact, dry Dressing procedure/placement/frequency: I will provide Nursing with guidance for topical care using a twice daily dressing of xeroform gauze, dry gauze and secured with Kerlix/paper tape.  Bilateral Pressure Redistribution heel boots are provided.  Sacral Stage 3 pressure injury is as noted on 1/43/88, care is complicated by the presence of fecal incontinence. [facility] nursing team will follow at least weekly, and will remain available to this patient, the nursing and medical teams.  Please re-consult if needed in between visits. Thanks, Maudie Flakes, MSN, RN, [place], Arther Abbott  Pager# [phone]

## 2017-08-13 NOTE — Progress Notes (Signed)
Pharmacy Antibiotic Note  Jocelyn Sanchez is a 62 y.o. female admitted on 08/07/2017 with N/V/D, found to be in shock. .  Pharmacy has been consulted for Zosyn dosing for sepsis.  Today, 08/13/2017 Day #6 antibiotics, #4 Zosyn for Proteus mirabilis bacteremia Tmax 102.4 WBC 22.3 SCr 0.6, CrCl 74 ml/min  Plan:  Consider narrowing abx spectrum to ampicillin  If MD wishes to continue Zosyn, dose of 3.375gm IV q8h (4hr extended infusions) remains appropriate  No further dosing adjustments needed  Pharmacy will sign off  Height: 5\' 3"  (160 cm) Weight: 182 lb 15.7 oz (83 kg) IBW/kg (Calculated) : 52.4  Temp (24hrs), Avg:99.4 F (37.4 C), Min:97.8 F (36.6 C), Max:102.4 F (39.1 C)  Recent Labs  Lab 08/07/17 2312  08/08/17 0036 08/08/17 0523 08/08/17 0748  08/09/17 0527 08/09/17 2057 08/10/17 0504 08/11/17 0545 08/12/17 0545 08/13/17 0506  WBC  --    < >  --  29.0*  --   --  36.2*  --   --  20.6* 22.1* 22.3*  CREATININE  --    < >  --  1.57*  --    < > 1.01* 0.84 0.75 0.62  --  0.60  LATICACIDVEN 5.14*  --  2.5*  --  1.4  --   --   --   --   --   --   --    < > = values in this interval not displayed.    Estimated Creatinine Clearance: 74.4 mL/min (by C-G formula based on SCr of 0.6 mg/dL).    Allergies  Allergen Reactions  . Sulfa Antibiotics Shortness Of Breath and Swelling    2/19 Vanc/Zosyn x 1 2/20 meropenem >> 2/20 2/20 vancomycin >> 2/20 2/20 Ceftriaxone >> 2/22 2/22 Zosyn >>   Dose adjustments this admission:   Microbiology results: 2/19: urine: multiple species 2/20 BCx: 2/4 GNR (BCID = Proteus) ( Pansens except intermediate to Cipro)  2/20 GI PCR: negative:   2/20 MRSA PCR: positive 2/22 C diff antigen positive , toxin negative  Thank you for allowing pharmacy to be a part of this patient's care.  Peggyann Juba, PharmD, BCPS Pager: [phone] 08/13/2017 9:18 AM

## 2017-08-14 ENCOUNTER — Encounter (HOSPITAL_COMMUNITY): Payer: Self-pay | Admitting: Gastroenterology

## 2017-08-14 ENCOUNTER — Inpatient Hospital Stay (HOSPITAL_COMMUNITY): Payer: Medicare Other

## 2017-08-14 DIAGNOSIS — Z882 Allergy status to sulfonamides status: Secondary | ICD-10-CM

## 2017-08-14 DIAGNOSIS — Z8269 Family history of other diseases of the musculoskeletal system and connective tissue: Secondary | ICD-10-CM

## 2017-08-14 DIAGNOSIS — R918 Other nonspecific abnormal finding of lung field: Secondary | ICD-10-CM

## 2017-08-14 DIAGNOSIS — R7881 Bacteremia: Secondary | ICD-10-CM | POA: Diagnosis present

## 2017-08-14 DIAGNOSIS — D649 Anemia, unspecified: Secondary | ICD-10-CM

## 2017-08-14 DIAGNOSIS — R509 Fever, unspecified: Secondary | ICD-10-CM

## 2017-08-14 DIAGNOSIS — Z87891 Personal history of nicotine dependence: Secondary | ICD-10-CM

## 2017-08-14 DIAGNOSIS — E1151 Type 2 diabetes mellitus with diabetic peripheral angiopathy without gangrene: Secondary | ICD-10-CM

## 2017-08-14 DIAGNOSIS — G35 Multiple sclerosis: Secondary | ICD-10-CM

## 2017-08-14 DIAGNOSIS — A498 Other bacterial infections of unspecified site: Secondary | ICD-10-CM | POA: Diagnosis present

## 2017-08-14 DIAGNOSIS — R197 Diarrhea, unspecified: Secondary | ICD-10-CM | POA: Diagnosis present

## 2017-08-14 DIAGNOSIS — D72829 Elevated white blood cell count, unspecified: Secondary | ICD-10-CM

## 2017-08-14 DIAGNOSIS — E11622 Type 2 diabetes mellitus with other skin ulcer: Secondary | ICD-10-CM

## 2017-08-14 DIAGNOSIS — L89153 Pressure ulcer of sacral region, stage 3: Secondary | ICD-10-CM

## 2017-08-14 LAB — GLUCOSE, CAPILLARY
GLUCOSE-CAPILLARY: 125 mg/dL — AB (ref 65–99)
Glucose-Capillary: 136 mg/dL — ABNORMAL HIGH (ref 65–99)
Glucose-Capillary: 84 mg/dL (ref 65–99)

## 2017-08-14 MED ORDER — IOPAMIDOL (ISOVUE-300) INJECTION 61%
30.0000 mL | Freq: Once | INTRAVENOUS | Status: AC
  Administered 2017-08-14: 30 mL via ORAL

## 2017-08-14 MED ORDER — IOPAMIDOL (ISOVUE-300) INJECTION 61%
INTRAVENOUS | Status: AC
  Filled 2017-08-14: qty 30

## 2017-08-14 MED ORDER — IOPAMIDOL (ISOVUE-300) INJECTION 61%
INTRAVENOUS | Status: AC
  Administered 2017-08-14: 100 mL via INTRAVENOUS
  Filled 2017-08-14: qty 100

## 2017-08-14 NOTE — Consult Note (Signed)
Reason for Consult: Diarrhea guaiac positivity on blood thinners chronic anemia Referring Physician: Hospital team  Jocelyn Sanchez is an 62 y.o. female.  HPI: Patient seen and examined and hospital computer chart reviewed ad he had an endoscopy a year ago by a different gastroenterologist which showed some reflux and may be a tiny AVM and at home she had no GI complaints was eating fine and gaining weight but since she's been here and started on antibiotics has had diarrhea and she also has bed sores and was found to be guaiac positive and she is on thinner and she had a flexible sigmoidoscopy 3 years ago for an abnormal CAT scan which was negative and she was admitted with sepsis and growing out Proteus but wants to eat and her family history is negative for any GI issues and she has no other complaints  Past Medical History:  Diagnosis Date  . Anemia   . DM II (diabetes mellitus, type II), controlled ([place])   . Gait disorder   . HTN (hypertension)   . Lymphedema   . MS (multiple sclerosis) ([place])   . Pulmonary embolism [facility])     Past Surgical History:  Procedure Laterality Date  . ESOPHAGOGASTRODUODENOSCOPY N/A 07/12/2016   Procedure: ESOPHAGOGASTRODUODENOSCOPY (EGD);  Surgeon: Irene Shipper, MD;  Location: [facility];  Service: Endoscopy;  Laterality: N/A;  . FLEXIBLE SIGMOIDOSCOPY Left 03/09/2015   Procedure: FLEXIBLE SIGMOIDOSCOPY;  Surgeon: Carol Ada, MD;  Location: [facility] ENDOSCOPY;  Service: Endoscopy;  Laterality: Left;    Family History  Problem Relation Age of Onset  . Multiple sclerosis Mother     Social History:  reports that she has quit smoking. Her smoking use included cigarettes. she has never used smokeless tobacco. She reports that she does not drink alcohol or use drugs.  Allergies:  Allergies  Allergen Reactions  . Sulfa Antibiotics Shortness Of Breath and Swelling    Medications: I have reviewed the patient's current medications.  Results for orders placed  or performed during the hospital encounter of 08/07/17 (from the past 48 hour(s))  Type and screen [place]     Status: None   Collection Time: 08/12/17  3:25 PM  Result Value Ref Range   ABO/RH(D) O POS    Antibody Screen NEG    Sample Expiration 08/15/2017    Unit Number [id number]    Blood Component Type RED CELLS,LR    Unit division 00    Status of Unit ISSUED,FINAL    Transfusion Status OK TO TRANSFUSE    Crossmatch Result      Compatible Performed at [facility], [place] [address], [place], [place] [address]   Prepare RBC     Status: None   Collection Time: 08/12/17  3:25 PM  Result Value Ref Range   Order Confirmation      ORDER PROCESSED BY BLOOD BANK Performed at [facility] [address], [place], [place] [address]   Glucose, capillary     Status: Abnormal   Collection Time: 08/12/17  4:21 PM  Result Value Ref Range   Glucose-Capillary 129 (H) 65 - 99 mg/dL  Glucose, capillary     Status: Abnormal   Collection Time: 08/12/17  9:41 PM  Result Value Ref Range   Glucose-Capillary 119 (H) 65 - 99 mg/dL   Comment 1 Notify RN   Magnesium     Status: Abnormal   Collection Time: 08/13/17  5:06 AM  Result Value Ref Range  Magnesium 1.2 (L) 1.7 - 2.4 mg/dL    Comment: Performed at [facility], [place] [address] [place], [place] [address]  CBC     Status: Abnormal   Collection Time: 08/13/17  5:06 AM  Result Value Ref Range   WBC 22.3 (H) 4.0 - 10.5 K/uL   RBC 3.49 (L) 3.87 - 5.11 MIL/uL   Hemoglobin 8.9 (L) 12.0 - 15.0 g/dL    Comment: DELTA CHECK NOTED POST TRANSFUSION SPECIMEN    HCT 25.9 (L) 36.0 - 46.0 %   MCV 74.2 (L) 78.0 - 100.0 fL   MCH 25.5 (L) 26.0 - 34.0 pg   MCHC 34.4 30.0 - 36.0 g/dL   RDW 19.5 (H) 11.5 - 15.5 %   Platelets 99 (L) 150 - 400 K/uL    Comment: CONSISTENT WITH PREVIOUS RESULT Performed at [facility] [address] [place], [place]  [address]   Basic metabolic panel     Status: Abnormal   Collection Time: 08/13/17  5:06 AM  Result Value Ref Range   Sodium 138 135 - 145 mmol/L   Potassium 4.6 3.5 - 5.1 mmol/L   Chloride 114 (H) 101 - 111 mmol/L   CO2 17 (L) 22 - 32 mmol/L   Glucose, Bld 78 65 - 99 mg/dL   BUN 12 6 - 20 mg/dL   Creatinine, Ser 0.60 0.44 - 1.00 mg/dL   Calcium 7.9 (L) 8.9 - 10.3 mg/dL   GFR calc non Af Amer >60 >60 mL/min   GFR calc Af Amer >60 >60 mL/min    Comment: (NOTE) The eGFR has been calculated using the CKD EPI equation. This calculation has not been validated in all clinical situations. eGFR's persistently <60 mL/min signify possible Chronic Kidney Disease.    Anion gap 7 5 - 15    Comment: Performed at [facility], [place] [address], [place],  [address]  Glucose, capillary     Status: None   Collection Time: 08/13/17  8:35 AM  Result Value Ref Range   Glucose-Capillary 75 65 - 99 mg/dL  Glucose, capillary     Status: Abnormal   Collection Time: 08/13/17  4:41 PM  Result Value Ref Range   Glucose-Capillary 159 (H) 65 - 99 mg/dL   Comment 1 Notify RN    Comment 2 Document in Chart   Glucose, capillary     Status: Abnormal   Collection Time: 08/13/17  9:38 PM  Result Value Ref Range   Glucose-Capillary 153 (H) 65 - 99 mg/dL   Comment 1 Notify RN   Glucose, capillary     Status: Abnormal   Collection Time: 08/14/17  7:58 AM  Result Value Ref Range   Glucose-Capillary 136 (H) 65 - 99 mg/dL    No results found.  ROS Blood pressure (!) 144/55, pulse 81, temperature 98.5 F (36.9 C), temperature source Oral, resp. rate 17, height '5\' 3"'  (1.6 m), weight 82.7 kg (182 lb 5.1 oz), SpO2 98 %. 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

## 2017-08-14 NOTE — Progress Notes (Signed)
[facility]  PROGRESS NOTE  REVONDA MENTER URK:[id number] DOB: April 13, 1956 DOA: 08/07/2017 PCP: Dixie Dials, MD   Brief HPI:   62 year old female with a history of MS causing her to be bedridden, diabetes mellitus, hypertension, pulmonary embolism came to ED with complaints of nausea vomiting and diarrhea, patient was found to be in hypothalamic shock. Despite aggressive fluid resuscitation patient remained hypotensive she was started on broad spectrum antibiotics empirically for possible septic shock and admitted to ICU [facility] service. Patient was started on with suppressor support. At this time patient is off his oppressors and has been transferred to hospital service. Hospitalist care resumed on August 09, 2017.   Subjective   Patient seen and examined, IV antibiotics were discontinued yesterday.  Started on Augmentin.  Still continues to spike temperature, 101 last night   Assessment/Plan:     1. Septic shock- resolved, off pressors-UTI versus pneumonia. Blood cultures growing gram-negative rods, Proteus mirabilis. Patient is now afebrile after starting on IV Zosyn , WBC is 22,000. Blood pressure is stable I called and discussed with infectious disease Dr. Megan Salon yesterday who recommended to discontinue IV Zosyn and start Augmentin.  Central line was also discontinued.  As patient continues to spike fever, I have consulted Dr. Megan Salon formally and he will see the patient today and make recommendations. 2. Sacral decubitus ulcer- continue wound care. 3. Diarrhea-continues to have diarrhea,  formed. C. diff antigen is positive, toxin is negative would not treat at this time. GI pathogen panel is negative. 4. Multiple sclerosis-patient is not on any treatment for MS. Diabetes mellitus-blood glucose is better controlled after changing  Lantus to 20 units sub Q daily, continue sliding scale insulin with NovoLog.  CBG (last 3)  Recent Labs    08/13/17 1641 08/13/17 2138  08/14/17 0758  GLUCAP 159* 153* 136*   5. Hypokalemia-  Replete. 6. Anemia-FOBT  is positive, hemoglobin is 8.9, status post 1 unit PRBC.  Will check CBC in a.m, as patient is more stable now, will consult GI for further evaluation. 7. Hypomagnesemia-magnesium is 1.2, will replace magnesium. Will check serum magnesium level in a.m. 8.  Pulmonary embolism-continue apixaban  9. Thrombocytopenia - today platelet count is 99,000, likely from antibiotics, IV Zosyn. Patient is not on Lovenox or heparin. Will repeat CBC in a.m.      DVT prophylaxis: apixaban  Code Status: partial code-patient is okay with BIPAP and Vasopressors. No intubation or cpr  Family Communication: no family at bedside  Disposition Plan: likely home in one to two days.   Consultants:  PCCM  Procedures:  none  Continuous infusions . sodium chloride    . norepinephrine Stopped (08/09/17 0830)      Antibiotics:   Anti-infectives (From admission, onward)   Start     Dose/Rate Route Frequency Ordered Stop   08/13/17 1600  amoxicillin-clavulanate (AUGMENTIN) 875-125 MG per tablet 1 tablet     1 tablet Oral Every 12 hours 08/13/17 1203     08/10/17 1000  piperacillin-tazobactam (ZOSYN) IVPB 3.375 g  Status:  Discontinued     3.375 g 12.5 mL/hr over 240 Minutes Intravenous Every 8 hours 08/10/17 0833 08/13/17 1203   08/08/17 1800  vancomycin (VANCOCIN) IVPB 1000 mg/200 mL premix  Status:  Discontinued     1,000 mg 200 mL/hr over 60 Minutes Intravenous Every 48 hours 08/08/17 0109 08/08/17 1403   08/08/17 1500  cefTRIAXone (ROCEPHIN) 2 g in sodium chloride 0.9 % 100 mL IVPB  Status:  Discontinued  2 g 200 mL/hr over 30 Minutes Intravenous Every 24 hours 08/08/17 1403 08/10/17 0756   08/08/17 0130  vancomycin (VANCOCIN) 1,500 mg in sodium chloride 0.9 % 500 mL IVPB  Status:  Discontinued     1,500 mg 250 mL/hr over 120 Minutes Intravenous  Once 08/08/17 0100 08/08/17 0101   08/08/17 0115  meropenem  (MERREM) 1 g in sodium chloride 0.9 % 100 mL IVPB  Status:  Discontinued     1 g 200 mL/hr over 30 Minutes Intravenous Every 8 hours 08/08/17 0100 08/08/17 1403   08/07/17 2130  piperacillin-tazobactam (ZOSYN) IVPB 3.375 g     3.375 g 100 mL/hr over 30 Minutes Intravenous  Once 08/07/17 2129 08/07/17 2224   08/07/17 2130  vancomycin (VANCOCIN) IVPB 1000 mg/200 mL premix     1,000 mg 200 mL/hr over 60 Minutes Intravenous  Once 08/07/17 2129 08/07/17 2331       Objective   Vitals:   08/14/17 0500 08/14/17 0600 08/14/17 0700 08/14/17 0800  BP: 138/65 (!) 149/52 (!) 144/55   Pulse: 81 89 81   Resp: 17 (!) 21 17   Temp:    98.5 F (36.9 C)  TempSrc:    Oral  SpO2: 96% 98% 98%   Weight:  82.7 kg (182 lb 5.1 oz)    Height:        Intake/Output Summary (Last 24 hours) at 08/14/2017 1320 Last data filed at 08/14/2017 0600 Gross per 24 hour  Intake 510 ml  Output 900 ml  Net -390 ml   Filed Weights   08/12/17 0337 08/13/17 0342 08/14/17 0600  Weight: 86.1 kg (189 lb 13.1 oz) 83 kg (182 lb 15.7 oz) 82.7 kg (182 lb 5.1 oz)     Physical Examination:  Physical Exam: Eyes: No icterus, extraocular muscles intact  Mouth: Oral mucosa is moist, no lesions on palate,  Neck: Supple, no deformities, masses, or tenderness Lungs: Normal respiratory effort, bilateral clear to auscultation, no crackles or wheezes.  Heart: Regular rate and rhythm, S1 and S2 normal, no murmurs, rubs auscultated Abdomen: BS normoactive,soft,nondistended,non-tender to palpation,no organomegaly Extremities: No pretibial edema, no erythema, no cyanosis, no clubbing Neuro : Alert and oriented to time, place and person, No focal deficits Skin: No rashes seen on exam    Data Reviewed: I have personally reviewed following labs and imaging studies  CBG: Recent Labs  Lab 08/12/17 2141 08/13/17 0835 08/13/17 1641 08/13/17 2138 08/14/17 0758  GLUCAP 119* 75 159* 153* 136*    CBC: Recent Labs  Lab  08/07/17 2328 08/08/17 0523 08/09/17 0527 08/11/17 0545 08/12/17 0545 08/13/17 0506  WBC 23.6* 29.0* 36.2* 20.6* 22.1* 22.3*  NEUTROABS 22.0*  --   --   --   --   --   HGB 7.6* 7.4* 7.5* 7.4* 7.0* 8.9*  HCT 23.1* 21.6* 22.3* 22.0* 20.2* 25.9*  MCV 74.8* 73.2* 72.6* 71.0* 71.1* 74.2*  PLT 265 280 198 69* 88* 99*    Basic Metabolic Panel: Recent Labs  Lab 08/08/17 0523  08/09/17 0527 08/09/17 2057 08/10/17 0504 08/11/17 0545 08/13/17 0506  NA 132*   < > 130* 132* 134* 134* 138  K 3.7   < > 2.9* 3.6 4.3 4.4 4.6  CL 102   < > 101 108 111 111 114*  CO2 15*   < > 13* 15* 15* 16* 17*  GLUCOSE 335*   < > 319* 137* 143* 239* 78  BUN 42*   < > 32* 26* 25*  18 12  CREATININE 1.57*   < > 1.01* 0.84 0.75 0.62 0.60  CALCIUM 6.8*   < > 7.1* 7.3* 7.1* 7.5* 7.9*  MG 1.2*  --  1.2*  --  1.6*  --  1.2*  PHOS 2.0*  --   --   --   --   --   --    < > = values in this interval not displayed.    Recent Results (from the past 240 hour(s))  Urine culture     Status: Abnormal   Collection Time: 08/07/17  9:29 PM  Result Value Ref Range Status   Specimen Description   Final    URINE, CATHETERIZED Performed at [facility] [address], [place], [place] [address]    Special Requests   Final    NONE Performed at [facility], [place] [address], [place], [place] [address]    Culture MULTIPLE SPECIES PRESENT, SUGGEST RECOLLECTION (A)  Final   Report Status 08/09/2017 FINAL  Final  Gastrointestinal Panel by PCR , Stool     Status: None   Collection Time: 08/07/17  9:37 PM  Result Value Ref Range Status   Campylobacter species NOT DETECTED NOT DETECTED Final   Plesimonas shigelloides NOT DETECTED NOT DETECTED Final   Salmonella species NOT DETECTED NOT DETECTED Final   Yersinia enterocolitica NOT DETECTED NOT DETECTED Final   Vibrio species NOT DETECTED NOT DETECTED Final   Vibrio cholerae NOT DETECTED NOT DETECTED Final   Enteroaggregative E coli  (EAEC) NOT DETECTED NOT DETECTED Final   Enteropathogenic E coli (EPEC) NOT DETECTED NOT DETECTED Final   Enterotoxigenic E coli (ETEC) NOT DETECTED NOT DETECTED Final   Shiga like toxin producing E coli (STEC) NOT DETECTED NOT DETECTED Final   Shigella/Enteroinvasive E coli (EIEC) NOT DETECTED NOT DETECTED Final   Cryptosporidium NOT DETECTED NOT DETECTED Final   Cyclospora cayetanensis NOT DETECTED NOT DETECTED Final   Entamoeba histolytica NOT DETECTED NOT DETECTED Final   Giardia lamblia NOT DETECTED NOT DETECTED Final   Adenovirus F40/41 NOT DETECTED NOT DETECTED Final   Astrovirus NOT DETECTED NOT DETECTED Final   Norovirus GI/GII NOT DETECTED NOT DETECTED Final   Rotavirus A NOT DETECTED NOT DETECTED Final   Sapovirus (I, II, IV, and V) NOT DETECTED NOT DETECTED Final    Comment: Performed at [facility], [place] [place], [place] [address]  Blood Culture (routine x 2)     Status: Abnormal   Collection Time: 08/07/17  9:57 PM  Result Value Ref Range Status   Specimen Description   Final    BLOOD RIGHT HAND Performed at [facility], [place] [address] [place], [place] [address]    Special Requests   Final    BOTTLES DRAWN AEROBIC AND ANAEROBIC Blood Culture adequate volume   Culture  Setup Time   Final    GRAM NEGATIVE RODS IN BOTH AEROBIC AND ANAEROBIC BOTTLES CRITICAL RESULT CALLED TO, READ BACK BY AND VERIFIED WITH: Christean Grief Pharm.D. 13:45 08/08/17 ([email or web address]) Performed at [facility], [place] [address], [place], [place] [address]    Culture PROTEUS MIRABILIS (A)  Final   Report Status 08/10/2017 FINAL  Final   Organism ID, Bacteria PROTEUS MIRABILIS  Final      Susceptibility   Proteus mirabilis - MIC*    AMPICILLIN <=2 SENSITIVE Sensitive     CEFAZOLIN <=4 SENSITIVE Sensitive     CEFEPIME <=1 SENSITIVE Sensitive  CEFTAZIDIME <=1 SENSITIVE Sensitive     CEFTRIAXONE <=1 SENSITIVE Sensitive     CIPROFLOXACIN 2 INTERMEDIATE  Intermediate     GENTAMICIN <=1 SENSITIVE Sensitive     IMIPENEM 2 SENSITIVE Sensitive     TRIMETH/SULFA <=20 SENSITIVE Sensitive     AMPICILLIN/SULBACTAM <=2 SENSITIVE Sensitive     PIP/TAZO <=4 SENSITIVE Sensitive     * PROTEUS MIRABILIS  Blood Culture ID Panel (Reflexed)     Status: Abnormal   Collection Time: 08/07/17  9:57 PM  Result Value Ref Range Status   Enterococcus species NOT DETECTED NOT DETECTED Final   Listeria monocytogenes NOT DETECTED NOT DETECTED Final   Staphylococcus species NOT DETECTED NOT DETECTED Final   Staphylococcus aureus NOT DETECTED NOT DETECTED Final   Streptococcus species NOT DETECTED NOT DETECTED Final   Streptococcus agalactiae NOT DETECTED NOT DETECTED Final   Streptococcus pneumoniae NOT DETECTED NOT DETECTED Final   Streptococcus pyogenes NOT DETECTED NOT DETECTED Final   Acinetobacter baumannii NOT DETECTED NOT DETECTED Final   Enterobacteriaceae species DETECTED (A) NOT DETECTED Final    Comment: Enterobacteriaceae represent a large family of gram-negative bacteria, not a single organism. CRITICAL RESULT CALLED TO, READ BACK BY AND VERIFIED WITH: Christean Grief Pharm.D. 13:45 08/08/17 ([email or web address])    Enterobacter cloacae complex NOT DETECTED NOT DETECTED Final   Escherichia coli NOT DETECTED NOT DETECTED Final   Klebsiella oxytoca NOT DETECTED NOT DETECTED Final   Klebsiella pneumoniae NOT DETECTED NOT DETECTED Final   Proteus species DETECTED (A) NOT DETECTED Final    Comment: CRITICAL RESULT CALLED TO, READ BACK BY AND VERIFIED WITH: Mila Merry.D. 13:45 08/08/17 ([email or web address])    Serratia marcescens NOT DETECTED NOT DETECTED Final   Carbapenem resistance NOT DETECTED NOT DETECTED Final   Haemophilus influenzae NOT DETECTED NOT DETECTED Final   Neisseria meningitidis NOT DETECTED NOT DETECTED Final   Pseudomonas aeruginosa NOT DETECTED NOT DETECTED Final   Candida albicans NOT DETECTED NOT DETECTED Final   Candida glabrata NOT DETECTED NOT DETECTED  Final   Candida krusei NOT DETECTED NOT DETECTED Final   Candida parapsilosis NOT DETECTED NOT DETECTED Final   Candida tropicalis NOT DETECTED NOT DETECTED Final  Blood Culture (routine x 2)     Status: None   Collection Time: 08/07/17 10:48 PM  Result Value Ref Range Status   Specimen Description   Final    BLOOD BLOOD RIGHT FOREARM Performed at [facility] [address] [place], [place] [address]    Special Requests   Final    BOTTLES DRAWN AEROBIC AND ANAEROBIC Blood Culture results may not be optimal due to an inadequate volume of blood received in culture bottles   Culture   Final    NO GROWTH 5 DAYS Performed at [facility], [place] [address], [place], [place] [address]    Report Status 08/13/2017 FINAL  Final  MRSA PCR Screening     Status: Abnormal   Collection Time: 08/08/17 12:40 AM  Result Value Ref Range Status   MRSA by PCR POSITIVE (A) NEGATIVE Final    Comment:        The GeneXpert MRSA Assay (FDA approved for NASAL specimens only), is one component of a comprehensive MRSA colonization surveillance program. It is not intended to diagnose MRSA infection nor to guide or monitor treatment for MRSA infections. RESULT CALLED TO, READ BACK BY AND VERIFIED WITH: LACIVITA,H RN 2.20.19 @346  ZANDO,C Performed at [facility], [place] [address]  [place], [place] [address]   C difficile quick scan w PCR reflex     Status: Abnormal   Collection Time: 08/10/17  9:00 PM  Result Value Ref Range Status   C Diff antigen POSITIVE (A) NEGATIVE Final   C Diff toxin NEGATIVE NEGATIVE Final   C Diff interpretation Results are indeterminate. See PCR results.  Final    Comment: Performed at [facility], [place] [address] [place], [place] [address]  C. Diff by PCR, Reflexed     Status: None   Collection Time: 08/10/17  9:00 PM  Result Value Ref Range Status   Toxigenic C. Difficile by PCR NEGATIVE NEGATIVE Final     Comment: Patient is colonized with non toxigenic C. difficile. May not need treatment unless significant symptoms are present. Performed at [facility], [place] [address], [place],  [address]      Liver Function Tests: Recent Labs  Lab 08/07/17 2328  AST 69*  ALT 27  ALKPHOS 107  BILITOT 1.3*  PROT 5.9*  ALBUMIN 2.3*   No results for input(s): LIPASE, AMYLASE in the last 168 hours. No results for input(s): AMMONIA in the last 168 hours.  Cardiac Enzymes: No results for input(s): CKTOTAL, CKMB, CKMBINDEX, TROPONINI in the last 168 hours. BNP (last 3 results) Recent Labs    08/07/17 2129  BNP 96.8    ProBNP (last 3 results) No results for input(s): PROBNP in the last 8760 hours.    Studies: No results found.  Scheduled Meds: . amoxicillin-clavulanate  1 tablet Oral Q12H  . apixaban  2.5 mg Oral BID  . carvedilol  3.125 mg Oral BID WC  . famotidine  20 mg Oral BID  . feeding supplement  1 Container Oral TID BM  . insulin aspart  0-20 Units Subcutaneous TID WC  . insulin aspart  4 Units Subcutaneous TID WC  . insulin glargine  20 Units Subcutaneous Daily  . multivitamin with minerals  1 tablet Oral Daily  . nystatin   Topical BID  . potassium chloride  40 mEq Oral BID      Time spent: 25 min  [facility] Pager [phone]. If 7PM-7AM, please contact night-coverage at [email or web address], Office  [phone]  password [email or web address]  08/14/2017, 1:20 PM  LOS: 6 days

## 2017-08-14 NOTE — Progress Notes (Signed)
Nutrition Follow-up  DOCUMENTATION CODES:   Obesity unspecified  INTERVENTION:  - Continue Boost Breeze TID. - Continue to encourage PO intakes.   NUTRITION DIAGNOSIS:   Inadequate oral intake related to acute illness, lethargy/confusion as evidenced by per patient/family report. -ongoing, improving.   GOAL:   Patient will meet greater than or equal to 90% of their needs -unmet on average.   MONITOR:   PO intake, Supplement acceptance, Weight trends, Labs, Skin  ASSESSMENT:   62 year old female with a history of MS causing her to be bedridden, diabetes mellitus, hypertension, pulmonary embolism came to ED with complaints of nausea vomiting and diarrhea, patient was found to be in hypothalamic shock. Despite aggressive fluid resuscitation patient remained hypotensive she was started on broad spectrum antibiotics empirically for possible septic shock and admitted to ICU   Weight fluctuations since admission. Diet advanced from CLD to Soft on 2/22 at 10:25 AM. Per chart review, pt consumed 50% of breakfast 2/23 and 25% of breakfast and lunch on 2/25. Pt reports she has mainly been consuming items such as oatmeal, chicken noodle soup, and other very soft/easy to consume items. Pt is willing to try other items such as mashed potatoes, soft cooked vegetables, meatloaf. She has been accepting all offers of Boost Breeze since order placed on 2/22. Pt denies any abdominal pain or nausea. Pt denies any nutrition-related questions or concerns at this time.  Medications reviewed; 20 mg Pepcid BID, sliding scale Novolog, 4 units Novolog TID, 20 units Lantus/day, 4 g IV Mg sulfate x1 run yesterday, daily multivitamin with minerals, 40 mEq oral KCl BID.  Labs reviewed; CBG: 136 mg/dL this AM, Cl: 114 mmol/L, Ca: 7.9 mg/dL, Mg: 1.2 mg/dL.      Diet Order:  DIET SOFT Room service appropriate? Yes; Fluid consistency: Thin  EDUCATION NEEDS:   No education needs have been identified at this  time  Skin:  Skin Assessment: Skin Integrity Issues: Skin Integrity Issues:: DTI DTI: L heel Stage III: sacrum  Last BM:  2/25  Height:   Ht Readings from Last 1 Encounters:  08/08/17 5\' 3"  (1.6 m)    Weight:   Wt Readings from Last 1 Encounters:  08/14/17 182 lb 5.1 oz (82.7 kg)    Ideal Body Weight:  52.27 kg  BMI:  Body mass index is 32.3 kg/m.  Estimated Nutritional Needs:   Kcal:  1930-2165 (25-28 kcal/kg)  Protein:  110-127 grams (1.3-1.5 grams/kg)  Fluid:  >/= 2 L/day      Jarome Matin, MS, RD, LDN, [facility] Inpatient Clinical Dietitian Pager # [phone] After hours/weekend pager # [phone]

## 2017-08-14 NOTE — Consult Note (Signed)
[facility]    Date of Admission:  08/07/2017   Total days of antibiotics 8        Day 2 amoxicillin clavulanate              Reason for Consult: Persistent fever and leukocytosis    Referring Provider: Dr. Eleonore Chiquito  Assessment: The cause of her persistent fever and leukocytosis is unclear.  She has improved clinically on therapy for transient Proteus mirabilis bacteremia.  She has now had 8 days of therapy which should be adequate for uncomplicated bacteremia.  I will stop amoxicillin clavulanate and observe off of antibiotics.  She is scheduled for an abdominal CT scan today that will help [place] evaluate her lower lungs and the possibility of pneumonia, complicated pyelonephritis, colitis or other acute abdominal process.  I would have a low threshold for starting oral vancomycin if her diarrhea persists and/or there is any evidence of colitis on CT scan.  If her fevers persist I will repeat blood cultures.  Plan: 1. Discontinue amoxicillin clavulanate and observe off of antibiotics 2. Repeat blood cultures if fever persists 3. Await results of abdominal CT scan  Principal Problem:   Fever Active Problems:   Leukocytosis   Proteus mirabilis infection   Diarrhea   Multiple sclerosis (HCC)   Diabetes mellitus with peripheral vascular disease (HCC)   Anemia of chronic disease   Decubitus ulcer of sacral region, stage 3 (HCC)   Essential hypertension, benign   PVD (peripheral vascular disease) (HCC)   Hx of pulmonary embolus   Scheduled Meds: . amoxicillin-clavulanate  1 tablet Oral Q12H  . apixaban  2.5 mg Oral BID  . carvedilol  3.125 mg Oral BID WC  . famotidine  20 mg Oral BID  . feeding supplement  1 Container Oral TID BM  . insulin aspart  0-20 Units Subcutaneous TID WC  . insulin aspart  4 Units Subcutaneous TID WC  . insulin glargine  20 Units Subcutaneous Daily  . iopamidol  30 mL Oral Once  . iopamidol      . multivitamin with  minerals  1 tablet Oral Daily  . nystatin   Topical BID  . potassium chloride  40 mEq Oral BID   Continuous Infusions: . sodium chloride    . norepinephrine Stopped (08/09/17 0830)   PRN Meds:.sodium chloride, acetaminophen, ALPRAZolam, HYDROcodone-acetaminophen, methocarbamol, ondansetron (ZOFRAN) IV  HPI: Jocelyn Sanchez is a 62 y.o. female with a history of multiple sclerosis and diabetes who was admitted to the hospital on 08/07/2017 with a temperature of 103.9 degrees, nausea and vomiting.  Admission notes indicate that she was having diarrhea but she tells me that the diarrhea did not begin until she got to the hospital.  She was in septic shock.  She was started on broad empiric antibiotic therapy.  1 of 2 admission blood cultures grew Proteus mirabilis.  Urine culture showed multiple species and was probably contaminated.  Chest x-ray showed possible left lower lobe density but she has not had any new cough or shortness of breath to suggest pneumonia.  She has improved clinically but continues to have fever and leukocytosis.  She is also continued to have diarrhea.  Her C. difficile antigen was positive but toxin was negative suggesting only colonization.  Her GI pathogen panel was negative.   Review of Systems: Review of Systems  Constitutional: Positive for fever and malaise/fatigue. Negative for chills, diaphoresis and weight  loss.  HENT: Negative for congestion and sore throat.   Respiratory: Negative for cough, sputum production and shortness of breath.   Cardiovascular: Negative for chest pain.  Gastrointestinal: Positive for diarrhea. Negative for abdominal pain, nausea and vomiting.  Genitourinary: Negative for dysuria.  Musculoskeletal: Negative for myalgias.  Skin: Negative for itching and rash.  Neurological: Negative for dizziness and headaches.    Past Medical History:  Diagnosis Date  . Anemia   . DM II (diabetes mellitus, type II), controlled ([place])   . Gait  disorder   . HTN (hypertension)   . Lymphedema   . MS (multiple sclerosis) ([place])   . Pulmonary embolism (HCC)     Social History   Tobacco Use  . Smoking status: Former Smoker    Types: Cigarettes  . Smokeless tobacco: Never Used  Substance Use Topics  . Alcohol use: No  . Drug use: No    Family History  Problem Relation Age of Onset  . Multiple sclerosis Mother    Allergies  Allergen Reactions  . Sulfa Antibiotics Shortness Of Breath and Swelling    OBJECTIVE: Blood pressure (!) 154/74, pulse 78, temperature 98.5 F (36.9 C), temperature source Oral, resp. rate 20, height 5\' 3"  (1.6 m), weight 182 lb 5.1 oz (82.7 kg), SpO2 100 %.  Physical Exam  Constitutional: She is oriented to person, place, and time.  She appears comfortable.  Her son is with her.  HENT:  Mouth/Throat: No oropharyngeal exudate.  Cardiovascular: Normal rate and regular rhythm.  No murmur heard. Pulmonary/Chest: Effort normal. She has no wheezes. She has no rales.  A right IJ central catheter was removed last night.  Abdominal: Soft. She exhibits no distension. There is no tenderness.  Musculoskeletal: Normal range of motion. She exhibits no edema or tenderness.  No lower extremity lymphedema or cellulitis.  Neurological: She is alert and oriented to person, place, and time.  Skin: No rash noted.  Stage III sacral decubitus without any evidence of active infection.  Psychiatric: Mood and affect normal.    Lab Results Lab Results  Component Value Date   WBC 22.3 (H) 08/13/2017   HGB 8.9 (L) 08/13/2017   HCT 25.9 (L) 08/13/2017   MCV 74.2 (L) 08/13/2017   PLT 99 (L) 08/13/2017    Lab Results  Component Value Date   CREATININE 0.60 08/13/2017   BUN 12 08/13/2017   NA 138 08/13/2017   K 4.6 08/13/2017   CL 114 (H) 08/13/2017   CO2 17 (L) 08/13/2017    Lab Results  Component Value Date   ALT 27 08/07/2017   AST 69 (H) 08/07/2017   ALKPHOS 107 08/07/2017   BILITOT 1.3 (H)  08/07/2017     Microbiology: Recent Results (from the past 240 hour(s))  Urine culture     Status: Abnormal   Collection Time: 08/07/17  9:29 PM  Result Value Ref Range Status   Specimen Description   Final    URINE, CATHETERIZED Performed at [facility], [place] [address], [place], [place] [address]    Special Requests   Final    NONE Performed at [facility], [place] [address] [place], [place] [address]    Culture MULTIPLE SPECIES PRESENT, SUGGEST RECOLLECTION (A)  Final   Report Status 08/09/2017 FINAL  Final  Gastrointestinal Panel by PCR , Stool     Status: None   Collection Time: 08/07/17  9:37 PM  Result Value Ref Range Status   Campylobacter species  NOT DETECTED NOT DETECTED Final   Plesimonas shigelloides NOT DETECTED NOT DETECTED Final   Salmonella species NOT DETECTED NOT DETECTED Final   Yersinia enterocolitica NOT DETECTED NOT DETECTED Final   Vibrio species NOT DETECTED NOT DETECTED Final   Vibrio cholerae NOT DETECTED NOT DETECTED Final   Enteroaggregative E coli (EAEC) NOT DETECTED NOT DETECTED Final   Enteropathogenic E coli (EPEC) NOT DETECTED NOT DETECTED Final   Enterotoxigenic E coli (ETEC) NOT DETECTED NOT DETECTED Final   Shiga like toxin producing E coli (STEC) NOT DETECTED NOT DETECTED Final   Shigella/Enteroinvasive E coli (EIEC) NOT DETECTED NOT DETECTED Final   Cryptosporidium NOT DETECTED NOT DETECTED Final   Cyclospora cayetanensis NOT DETECTED NOT DETECTED Final   Entamoeba histolytica NOT DETECTED NOT DETECTED Final   Giardia lamblia NOT DETECTED NOT DETECTED Final   Adenovirus F40/41 NOT DETECTED NOT DETECTED Final   Astrovirus NOT DETECTED NOT DETECTED Final   Norovirus GI/GII NOT DETECTED NOT DETECTED Final   Rotavirus A NOT DETECTED NOT DETECTED Final   Sapovirus (I, II, IV, and V) NOT DETECTED NOT DETECTED Final    Comment: Performed at [facility], [place] [place], [place] [address]    Blood Culture (routine x 2)     Status: Abnormal   Collection Time: 08/07/17  9:57 PM  Result Value Ref Range Status   Specimen Description   Final    BLOOD RIGHT HAND Performed at [facility], [place] [address], [place], [place] [address]    Special Requests   Final    BOTTLES DRAWN AEROBIC AND ANAEROBIC Blood Culture adequate volume   Culture  Setup Time   Final    GRAM NEGATIVE RODS IN BOTH AEROBIC AND ANAEROBIC BOTTLES CRITICAL RESULT CALLED TO, READ BACK BY AND VERIFIED WITH: Christean Grief Pharm.D. 13:45 08/08/17 ([email or web address]) Performed at [facility], [place] [address] [place], [place] [address]    Culture PROTEUS MIRABILIS (A)  Final   Report Status 08/10/2017 FINAL  Final   Organism ID, Bacteria PROTEUS MIRABILIS  Final      Susceptibility   Proteus mirabilis - MIC*    AMPICILLIN <=2 SENSITIVE Sensitive     CEFAZOLIN <=4 SENSITIVE Sensitive     CEFEPIME <=1 SENSITIVE Sensitive     CEFTAZIDIME <=1 SENSITIVE Sensitive     CEFTRIAXONE <=1 SENSITIVE Sensitive     CIPROFLOXACIN 2 INTERMEDIATE Intermediate     GENTAMICIN <=1 SENSITIVE Sensitive     IMIPENEM 2 SENSITIVE Sensitive     TRIMETH/SULFA <=20 SENSITIVE Sensitive     AMPICILLIN/SULBACTAM <=2 SENSITIVE Sensitive     PIP/TAZO <=4 SENSITIVE Sensitive     * PROTEUS MIRABILIS  Blood Culture ID Panel (Reflexed)     Status: Abnormal   Collection Time: 08/07/17  9:57 PM  Result Value Ref Range Status   Enterococcus species NOT DETECTED NOT DETECTED Final   Listeria monocytogenes NOT DETECTED NOT DETECTED Final   Staphylococcus species NOT DETECTED NOT DETECTED Final   Staphylococcus aureus NOT DETECTED NOT DETECTED Final   Streptococcus species NOT DETECTED NOT DETECTED Final   Streptococcus agalactiae NOT DETECTED NOT DETECTED Final   Streptococcus pneumoniae NOT DETECTED NOT DETECTED Final   Streptococcus pyogenes NOT DETECTED NOT DETECTED Final   Acinetobacter baumannii NOT DETECTED NOT DETECTED Final    Enterobacteriaceae species DETECTED (A) NOT DETECTED Final    Comment: Enterobacteriaceae represent a large family of gram-negative bacteria, not a single organism. CRITICAL RESULT CALLED TO,  READ BACK BY AND VERIFIED WITH: Christean Grief Pharm.D. 13:45 08/08/17 ([email or web address])    Enterobacter cloacae complex NOT DETECTED NOT DETECTED Final   Escherichia coli NOT DETECTED NOT DETECTED Final   Klebsiella oxytoca NOT DETECTED NOT DETECTED Final   Klebsiella pneumoniae NOT DETECTED NOT DETECTED Final   Proteus species DETECTED (A) NOT DETECTED Final    Comment: CRITICAL RESULT CALLED TO, READ BACK BY AND VERIFIED WITH: Mila Merry.D. 13:45 08/08/17 ([email or web address])    Serratia marcescens NOT DETECTED NOT DETECTED Final   Carbapenem resistance NOT DETECTED NOT DETECTED Final   Haemophilus influenzae NOT DETECTED NOT DETECTED Final   Neisseria meningitidis NOT DETECTED NOT DETECTED Final   Pseudomonas aeruginosa NOT DETECTED NOT DETECTED Final   Candida albicans NOT DETECTED NOT DETECTED Final   Candida glabrata NOT DETECTED NOT DETECTED Final   Candida krusei NOT DETECTED NOT DETECTED Final   Candida parapsilosis NOT DETECTED NOT DETECTED Final   Candida tropicalis NOT DETECTED NOT DETECTED Final  Blood Culture (routine x 2)     Status: None   Collection Time: 08/07/17 10:48 PM  Result Value Ref Range Status   Specimen Description   Final    BLOOD BLOOD RIGHT FOREARM Performed at [place] [address] [place], [place] [address]    Special Requests   Final    BOTTLES DRAWN AEROBIC AND ANAEROBIC Blood Culture results may not be optimal due to an inadequate volume of blood received in culture bottles   Culture   Final    NO GROWTH 5 DAYS Performed at [facility], [place] [address] [place], [place] [address]    Report Status 08/13/2017 FINAL  Final  MRSA PCR Screening     Status: Abnormal   Collection Time: 08/08/17 12:40 AM  Result Value Ref Range Status   MRSA by PCR  POSITIVE (A) NEGATIVE Final    Comment:        The GeneXpert MRSA Assay (FDA approved for NASAL specimens only), is one component of a comprehensive MRSA colonization surveillance program. It is not intended to diagnose MRSA infection nor to guide or monitor treatment for MRSA infections. RESULT CALLED TO, READ BACK BY AND VERIFIED WITH: LACIVITA,H RN 2.20.19 @346  ZANDO,C Performed at [facility], [place] [address] [place], [place] [address]   C difficile quick scan w PCR reflex     Status: Abnormal   Collection Time: 08/10/17  9:00 PM  Result Value Ref Range Status   C Diff antigen POSITIVE (A) NEGATIVE Final   C Diff toxin NEGATIVE NEGATIVE Final   C Diff interpretation Results are indeterminate. See PCR results.  Final    Comment: Performed at [facility], [place] [address], [name], [place] [address]  C. Diff by PCR, Reflexed     Status: None   Collection Time: 08/10/17  9:00 PM  Result Value Ref Range Status   Toxigenic C. Difficile by PCR NEGATIVE NEGATIVE Final    Comment: Patient is colonized with non toxigenic C. difficile. May not need treatment unless significant symptoms are present. Performed at [facility], [place] [address] [place], [place] [address]     Michel Bickers, [place] [facility] [phone] pager   [phone] cell 08/14/2017, 3:38 PM

## 2017-08-15 DIAGNOSIS — I2510 Atherosclerotic heart disease of native coronary artery without angina pectoris: Secondary | ICD-10-CM

## 2017-08-15 DIAGNOSIS — R7881 Bacteremia: Secondary | ICD-10-CM

## 2017-08-15 DIAGNOSIS — A09 Infectious gastroenteritis and colitis, unspecified: Secondary | ICD-10-CM

## 2017-08-15 DIAGNOSIS — Z978 Presence of other specified devices: Secondary | ICD-10-CM

## 2017-08-15 DIAGNOSIS — N210 Calculus in bladder: Secondary | ICD-10-CM

## 2017-08-15 DIAGNOSIS — J90 Pleural effusion, not elsewhere classified: Secondary | ICD-10-CM

## 2017-08-15 DIAGNOSIS — I700 Atherosclerosis of aorta: Secondary | ICD-10-CM

## 2017-08-15 DIAGNOSIS — N200 Calculus of kidney: Secondary | ICD-10-CM

## 2017-08-15 LAB — GLUCOSE, CAPILLARY
GLUCOSE-CAPILLARY: 156 mg/dL — AB (ref 65–99)
GLUCOSE-CAPILLARY: 103 mg/dL — AB (ref 65–99)
Glucose-Capillary: 98 mg/dL (ref 65–99)
Glucose-Capillary: 81 mg/dL (ref 65–99)
Glucose-Capillary: 143 mg/dL — ABNORMAL HIGH (ref 65–99)
Glucose-Capillary: 89 mg/dL (ref 65–99)

## 2017-08-15 MED ORDER — INSULIN GLARGINE 100 UNIT/ML ~~LOC~~ SOLN
10.0000 [IU] | Freq: Every day | SUBCUTANEOUS | Status: DC
  Filled 2017-08-15: qty 0.1

## 2017-08-15 NOTE — Progress Notes (Addendum)
PROGRESS NOTE    Jocelyn Sanchez  MWU:[id number] DOB: [birth year] DOA: 08/07/2017 PCP: Dixie Dials, MD    Brief Narrative:  62 year old female who presented with shock.  She does have a significant past medical history for multiple sclerosis, bedridden, type 2 diabetes mellitus, hypertension and pulmonary embolism.  He had 3-day history of nausea, vomiting and diarrhea, on the initial physical examination she was febrile, tachycardic and hypotensive.  She was awake and alert, dry mucous membranes, heart S1-S2 present, tachycardic, lungs with no wheezing, rales or rhonchi, abdomen was soft nontender, lower extremity with trace edema.  Sodium 128, potassium 4.0, chloride 97, bicarb 14, glucose 345, BUN 46, creatinine 1.95, calcium 7.6, white count 23.6, hemoglobin 7.6, hematocrit 23.1, platelets 265.  Urinalysis with too numerous to count white cells, too numerous counts RBCs, specific gravity 1.018, protein greater than 300.  C. difficile antigen positive.  Chest x-ray with bibasilar atelectasis more left than right, film hypoinflated with left-sided rotation.  EKG sinus tachycardia, axis deviation, normal intervals.   Patient was admitted to the hospital diagnosis of septic shock related to urinary tract infection.    Assessment & Plan:   Principal Problem:   Fever Active Problems:   Multiple sclerosis (HCC)   Diabetes mellitus with peripheral vascular disease (HCC)   Leukocytosis   Anemia of chronic disease   Decubitus ulcer of sacral region, stage 3 (HCC)   Essential hypertension, benign   PVD (peripheral vascular disease) (HCC)   Hx of pulmonary embolus   Proteus mirabilis infection   Diarrhea   Bacteremia  1.  Septic shock due to urinary tract infection. Patient hemodynamically stable, will continue close blood pressure monitoring. Patient off antibiotic therapy, cultures positive for Proteus M. Will dc telemetry and will transfer to medical ward. Out of bed as tolerated, patient  has been afebrile, wbc stable at 22.  Will follow on cell count in am.   2.  Sacral decubitus ulcer. Quarter size stage 2, unlikely to be source of infection, will continue local wound care, frequent turning.   3.  Diarrhea. Improved, patient's volume has improved. Will hold on further K supplements.   4.  Multiple sclerosis. Will continue dvt prophylaxis.   5.  Type 2 diabetes mellitus. Continue glucose cover and monitoring with insulin sliding scale. Basal insulin with glargine, 20 units, capillary glucose   6. HTN. Will continue coreg for blood pressure control.   7. Pulmonary embolism. Continue anticoagulation with apixaban.   DVT prophylaxis: scd  Code Status:  dnr Family Communication: no family at the beside Disposition Plan: home   Consultants:     Procedures:     Antimicrobials:       Subjective: Patient is feeling well, positive weakness, no nausea or vomiting, no chest pain or dyspnea.   Objective: Vitals:   08/15/17 0304 08/15/17 0500 08/15/17 0600 08/15/17 0700  BP: 125/72 (!) 145/80 (!) 144/69 (!) 143/72  Pulse: 76 75 74 89  Resp: (!) 22 18 13 18   Temp: 98.5 F (36.9 C)     TempSrc: Oral     SpO2: 98% 98% 100% 99%  Weight:  82.1 kg (180 lb 16 oz)    Height:        Intake/Output Summary (Last 24 hours) at 08/15/2017 0821 Last data filed at 08/15/2017 0518 Gross per 24 hour  Intake 360 ml  Output 600 ml  Net -240 ml   Filed Weights   08/13/17 0342 08/14/17 0600 08/15/17 0500  Weight: 83  kg (182 lb 15.7 oz) 82.7 kg (182 lb 5.1 oz) 82.1 kg (180 lb 16 oz)    Examination:   General: Not in pain or dyspnea, deconditioned Neurology: Awake and alert, non focal  E ENT: mild pallor, no icterus, oral mucosa moist Cardiovascular: No JVD. S1-S2 present, rhythmic, no gallops, rubs, or murmurs. No lower extremity edema. Pulmonary: decreased breath sounds bilaterally at bases adequate air movement, no wheezing, rhonchi or rales. Gastrointestinal.  Abdomen protuberant, no organomegaly, non tender, no rebound or guarding Skin. No rashes Musculoskeletal: no joint deformities     Data Reviewed: I have personally reviewed following labs and imaging studies  CBC: Recent Labs  Lab 08/09/17 0527 08/11/17 0545 08/12/17 0545 08/13/17 0506  WBC 36.2* 20.6* 22.1* 22.3*  HGB 7.5* 7.4* 7.0* 8.9*  HCT 22.3* 22.0* 20.2* 25.9*  MCV 72.6* 71.0* 71.1* 74.2*  PLT 198 69* 88* 99*   Basic Metabolic Panel: Recent Labs  Lab 08/09/17 0527 08/09/17 2057 08/10/17 0504 08/11/17 0545 08/13/17 0506  NA 130* 132* 134* 134* 138  K 2.9* 3.6 4.3 4.4 4.6  CL 101 108 111 111 114*  CO2 13* 15* 15* 16* 17*  GLUCOSE 319* 137* 143* 239* 78  BUN 32* 26* 25* 18 12  CREATININE 1.01* 0.84 0.75 0.62 0.60  CALCIUM 7.1* 7.3* 7.1* 7.5* 7.9*  MG 1.2*  --  1.6*  --  1.2*   GFR: Estimated Creatinine Clearance: 74 mL/min (by C-G formula based on SCr of 0.6 mg/dL). Liver Function Tests: No results for input(s): AST, ALT, ALKPHOS, BILITOT, PROT, ALBUMIN in the last 168 hours. No results for input(s): LIPASE, AMYLASE in the last 168 hours. No results for input(s): AMMONIA in the last 168 hours. Coagulation Profile: No results for input(s): INR, PROTIME in the last 168 hours. Cardiac Enzymes: No results for input(s): CKTOTAL, CKMB, CKMBINDEX, TROPONINI in the last 168 hours. BNP (last 3 results) No results for input(s): PROBNP in the last 8760 hours. HbA1C: No results for input(s): HGBA1C in the last 72 hours. CBG: Recent Labs  Lab 08/13/17 2138 08/14/17 0758 08/14/17 1147 08/14/17 1659 08/14/17 2138  GLUCAP 153* 136* 156* 125* 84   Lipid Profile: No results for input(s): CHOL, HDL, LDLCALC, TRIG, CHOLHDL, LDLDIRECT in the last 72 hours. Thyroid Function Tests: No results for input(s): TSH, T4TOTAL, FREET4, T3FREE, THYROIDAB in the last 72 hours. Anemia Panel: No results for input(s): VITAMINB12, FOLATE, FERRITIN, TIBC, IRON, RETICCTPCT in the  last 72 hours.    Radiology Studies: I have reviewed all of the imaging during this hospital visit personally     Scheduled Meds: . apixaban  2.5 mg Oral BID  . carvedilol  3.125 mg Oral BID WC  . famotidine  20 mg Oral BID  . feeding supplement  1 Container Oral TID BM  . insulin aspart  0-20 Units Subcutaneous TID WC  . insulin aspart  4 Units Subcutaneous TID WC  . insulin glargine  20 Units Subcutaneous Daily  . multivitamin with minerals  1 tablet Oral Daily  . nystatin   Topical BID  . potassium chloride  40 mEq Oral BID   Continuous Infusions: . sodium chloride    . norepinephrine Stopped (08/09/17 0830)     LOS: 7 days        Ree Alcalde Gerome Apley, MD [facility] Pager [phone]

## 2017-08-15 NOTE — Progress Notes (Signed)
Patient ID: Jocelyn Sanchez, female   DOB: 1955-10-31, 62 y.o.   MRN: [id number]          [facility]  Date of Admission:  08/07/2017     ASSESSMENT: She is now afebrile and feeling better.  No acute abnormalities were noted on her abdominal CT scan.  There was no evidence of pneumonia, complicated pyelonephritis or colitis.  PLAN: 1. Continue observation off of antibiotics  Principal Problem:   Fever Active Problems:   Leukocytosis   Proteus mirabilis infection   Diarrhea   Multiple sclerosis (HCC)   Diabetes mellitus with peripheral vascular disease (HCC)   Anemia of chronic disease   Decubitus ulcer of sacral region, stage 3 (HCC)   Essential hypertension, benign   PVD (peripheral vascular disease) (HCC)   Hx of pulmonary embolus   Bacteremia   Scheduled Meds: . apixaban  2.5 mg Oral BID  . carvedilol  3.125 mg Oral BID WC  . famotidine  20 mg Oral BID  . feeding supplement  1 Container Oral TID BM  . insulin aspart  0-20 Units Subcutaneous TID WC  . insulin aspart  4 Units Subcutaneous TID WC  . [START ON 08/16/2017] insulin glargine  10 Units Subcutaneous Daily  . multivitamin with minerals  1 tablet Oral Daily  . nystatin   Topical BID   Continuous Infusions: . sodium chloride     PRN Meds:.sodium chloride, acetaminophen, ALPRAZolam, HYDROcodone-acetaminophen, methocarbamol, ondansetron (ZOFRAN) IV   SUBJECTIVE: She is still having some diarrhea but it has decreased and she is feeling much better.  Review of Systems: Review of Systems  Constitutional: Negative for chills, diaphoresis and fever.  Respiratory: Negative for cough.   Cardiovascular: Negative for chest pain.  Gastrointestinal: Positive for diarrhea. Negative for abdominal pain, nausea and vomiting.    Allergies  Allergen Reactions  . Sulfa Antibiotics Shortness Of Breath and Swelling    OBJECTIVE: Vitals:   08/15/17 1000 08/15/17 1200 08/15/17 1422 08/15/17  1549  BP: 125/71 (!) 112/42  132/69  Pulse: 83 71 77 77  Resp: 14 11 13 16   Temp:  98.3 F (36.8 C)  98.6 F (37 C)  TempSrc:  Oral  Oral  SpO2: 99% 98% 100% 100%  Weight:      Height:       Body mass index is 32.06 kg/m.  Physical Exam  Constitutional: She is oriented to person, place, and time.  She is alert and in good spirits.  She is visiting with a friend.  Cardiovascular: Normal rate and regular rhythm.  No murmur heard. Pulmonary/Chest: Effort normal. She has no wheezes. She has no rales.  Abdominal: Soft. She exhibits no distension. There is no tenderness.  Small amount of liquid brown stool in the Flexi-Seal bag.  Musculoskeletal: Normal range of motion. She exhibits no edema or tenderness.  Neurological: She is alert and oriented to person, place, and time.  Skin: No rash noted.  Psychiatric: Mood and affect normal.    Lab Results Lab Results  Component Value Date   WBC 22.3 (H) 08/13/2017   HGB 8.9 (L) 08/13/2017   HCT 25.9 (L) 08/13/2017   MCV 74.2 (L) 08/13/2017   PLT 99 (L) 08/13/2017    Lab Results  Component Value Date   CREATININE 0.60 08/13/2017   BUN 12 08/13/2017   NA 138 08/13/2017   K 4.6 08/13/2017   CL 114 (H) 08/13/2017   CO2 17 (L) 08/13/2017  Lab Results  Component Value Date   ALT 27 08/07/2017   AST 69 (H) 08/07/2017   ALKPHOS 107 08/07/2017   BILITOT 1.3 (H) 08/07/2017     Microbiology: Recent Results (from the past 240 hour(s))  Urine culture     Status: Abnormal   Collection Time: 08/07/17  9:29 PM  Result Value Ref Range Status   Specimen Description   Final    URINE, CATHETERIZED Performed at [facility], [place] [address] [place], [place] [address]    Special Requests   Final    NONE Performed at [facility], [place] [address], [place], [place] [address]    Culture MULTIPLE SPECIES PRESENT, SUGGEST RECOLLECTION (A)  Final   Report Status 08/09/2017 FINAL  Final    Gastrointestinal Panel by PCR , Stool     Status: None   Collection Time: 08/07/17  9:37 PM  Result Value Ref Range Status   Campylobacter species NOT DETECTED NOT DETECTED Final   Plesimonas shigelloides NOT DETECTED NOT DETECTED Final   Salmonella species NOT DETECTED NOT DETECTED Final   Yersinia enterocolitica NOT DETECTED NOT DETECTED Final   Vibrio species NOT DETECTED NOT DETECTED Final   Vibrio cholerae NOT DETECTED NOT DETECTED Final   Enteroaggregative E coli (EAEC) NOT DETECTED NOT DETECTED Final   Enteropathogenic E coli (EPEC) NOT DETECTED NOT DETECTED Final   Enterotoxigenic E coli (ETEC) NOT DETECTED NOT DETECTED Final   Shiga like toxin producing E coli (STEC) NOT DETECTED NOT DETECTED Final   Shigella/Enteroinvasive E coli (EIEC) NOT DETECTED NOT DETECTED Final   Cryptosporidium NOT DETECTED NOT DETECTED Final   Cyclospora cayetanensis NOT DETECTED NOT DETECTED Final   Entamoeba histolytica NOT DETECTED NOT DETECTED Final   Giardia lamblia NOT DETECTED NOT DETECTED Final   Adenovirus F40/41 NOT DETECTED NOT DETECTED Final   Astrovirus NOT DETECTED NOT DETECTED Final   Norovirus GI/GII NOT DETECTED NOT DETECTED Final   Rotavirus A NOT DETECTED NOT DETECTED Final   Sapovirus (I, II, IV, and V) NOT DETECTED NOT DETECTED Final    Comment: Performed at [facility], [place] [place], [place] [address]  Blood Culture (routine x 2)     Status: Abnormal   Collection Time: 08/07/17  9:57 PM  Result Value Ref Range Status   Specimen Description   Final    BLOOD RIGHT HAND Performed at [facility], [place] [address], [place], [place] [address]    Special Requests   Final    BOTTLES DRAWN AEROBIC AND ANAEROBIC Blood Culture adequate volume   Culture  Setup Time   Final    GRAM NEGATIVE RODS IN BOTH AEROBIC AND ANAEROBIC BOTTLES CRITICAL RESULT CALLED TO, READ BACK BY AND VERIFIED WITH: Christean Grief Pharm.D. 13:45 08/08/17 ([email or web address]) Performed at  [facility], [place] [address] [place], [place] [address]    Culture PROTEUS MIRABILIS (A)  Final   Report Status 08/10/2017 FINAL  Final   Organism ID, Bacteria PROTEUS MIRABILIS  Final      Susceptibility   Proteus mirabilis - MIC*    AMPICILLIN <=2 SENSITIVE Sensitive     CEFAZOLIN <=4 SENSITIVE Sensitive     CEFEPIME <=1 SENSITIVE Sensitive     CEFTAZIDIME <=1 SENSITIVE Sensitive     CEFTRIAXONE <=1 SENSITIVE Sensitive     CIPROFLOXACIN 2 INTERMEDIATE Intermediate     GENTAMICIN <=1 SENSITIVE Sensitive     IMIPENEM 2 SENSITIVE Sensitive  TRIMETH/SULFA <=20 SENSITIVE Sensitive     AMPICILLIN/SULBACTAM <=2 SENSITIVE Sensitive     PIP/TAZO <=4 SENSITIVE Sensitive     * PROTEUS MIRABILIS  Blood Culture ID Panel (Reflexed)     Status: Abnormal   Collection Time: 08/07/17  9:57 PM  Result Value Ref Range Status   Enterococcus species NOT DETECTED NOT DETECTED Final   Listeria monocytogenes NOT DETECTED NOT DETECTED Final   Staphylococcus species NOT DETECTED NOT DETECTED Final   Staphylococcus aureus NOT DETECTED NOT DETECTED Final   Streptococcus species NOT DETECTED NOT DETECTED Final   Streptococcus agalactiae NOT DETECTED NOT DETECTED Final   Streptococcus pneumoniae NOT DETECTED NOT DETECTED Final   Streptococcus pyogenes NOT DETECTED NOT DETECTED Final   Acinetobacter baumannii NOT DETECTED NOT DETECTED Final   Enterobacteriaceae species DETECTED (A) NOT DETECTED Final    Comment: Enterobacteriaceae represent a large family of gram-negative bacteria, not a single organism. CRITICAL RESULT CALLED TO, READ BACK BY AND VERIFIED WITH: Christean Grief Pharm.D. 13:45 08/08/17 ([email or web address])    Enterobacter cloacae complex NOT DETECTED NOT DETECTED Final   Escherichia coli NOT DETECTED NOT DETECTED Final   Klebsiella oxytoca NOT DETECTED NOT DETECTED Final   Klebsiella pneumoniae NOT DETECTED NOT DETECTED Final   Proteus species DETECTED (A) NOT DETECTED Final    Comment: CRITICAL  RESULT CALLED TO, READ BACK BY AND VERIFIED WITH: Mila Merry.D. 13:45 08/08/17 ([email or web address])    Serratia marcescens NOT DETECTED NOT DETECTED Final   Carbapenem resistance NOT DETECTED NOT DETECTED Final   Haemophilus influenzae NOT DETECTED NOT DETECTED Final   Neisseria meningitidis NOT DETECTED NOT DETECTED Final   Pseudomonas aeruginosa NOT DETECTED NOT DETECTED Final   Candida albicans NOT DETECTED NOT DETECTED Final   Candida glabrata NOT DETECTED NOT DETECTED Final   Candida krusei NOT DETECTED NOT DETECTED Final   Candida parapsilosis NOT DETECTED NOT DETECTED Final   Candida tropicalis NOT DETECTED NOT DETECTED Final  Blood Culture (routine x 2)     Status: None   Collection Time: 08/07/17 10:48 PM  Result Value Ref Range Status   Specimen Description   Final    BLOOD BLOOD RIGHT FOREARM Performed at [place] [address] [place], [place] [address]    Special Requests   Final    BOTTLES DRAWN AEROBIC AND ANAEROBIC Blood Culture results may not be optimal due to an inadequate volume of blood received in culture bottles   Culture   Final    NO GROWTH 5 DAYS Performed at [facility], [place] [address], [place], [place] [address]    Report Status 08/13/2017 FINAL  Final  MRSA PCR Screening     Status: Abnormal   Collection Time: 08/08/17 12:40 AM  Result Value Ref Range Status   MRSA by PCR POSITIVE (A) NEGATIVE Final    Comment:        The GeneXpert MRSA Assay (FDA approved for NASAL specimens only), is one component of a comprehensive MRSA colonization surveillance program. It is not intended to diagnose MRSA infection nor to guide or monitor treatment for MRSA infections. RESULT CALLED TO, READ BACK BY AND VERIFIED WITH: LACIVITA,H RN 2.20.19 @346  ZANDO,C Performed at [facility], [place] [address], [place], [place] [address]   C difficile quick scan w PCR reflex     Status: Abnormal   Collection Time: 08/10/17  9:00  PM  Result Value Ref Range Status   C Diff antigen POSITIVE (A) NEGATIVE  Final   C Diff toxin NEGATIVE NEGATIVE Final   C Diff interpretation Results are indeterminate. See PCR results.  Final    Comment: Performed at [facility], [place] [address], [place], [place] [address]  C. Diff by PCR, Reflexed     Status: None   Collection Time: 08/10/17  9:00 PM  Result Value Ref Range Status   Toxigenic C. Difficile by PCR NEGATIVE NEGATIVE Final    Comment: Patient is colonized with non toxigenic C. difficile. May not need treatment unless significant symptoms are present. Performed at [facility], [place] [address] [place], [place] [address]    CT abdomen and pelvis 08/14/2017  IMPRESSION: 1. In addition to multiple large nonobstructive calculi in the collecting systems of both kidneys there is a left ureterovesicular junction stone which measures 16 mm in diameter, which is associated with mild proximal left hydronephrosis. 2. Several tiny calculi also noted in the urinary bladder, measuring up to 4 mm. 3. No other definite acute findings noted in the abdomen or pelvis to account for the patient's symptoms. 4. Small bilateral pleural effusions lying dependently. 5. Aortic atherosclerosis, in addition to at least 3 vessel coronary artery disease. Please note that although the presence of coronary artery calcium documents the presence of coronary artery disease, the severity of this disease and any potential stenosis cannot be assessed on this non-gated CT examination. Assessment for potential risk factor modification, dietary therapy or pharmacologic therapy may be warranted, if clinically indicated. 6. Additional incidental findings, as above.   Electronically Signed   By: Vinnie Langton M.D.   On: 08/14/2017 19:53  Michel Bickers, [facility] [phone] pager   [phone] cell 08/15/2017, 4:31  PM

## 2017-08-15 NOTE — Progress Notes (Signed)
Jocelyn Sanchez 2:40 PM  Subjective: Patient doing much better eating fine no signs of bleeding and diarrhea better no new complaints  Objective: Vital signs stable afebrile no acute distress abdomen is soft nontender no new labs CT okay from a GI standpoint lots of kidney stones however  Assessment: Improved GI symptoms  Plan: Please let me know if I could be of any further assistance with this hospital stay and with her multiple medical problems would probably suggest to her primary care physician to proceed with a cologard test for screening and if positive we should attempt outpatient colonoscopy at some point and I'm happy to see back when necessary  [facility] E  Pager [phone] After 5PM or if no answer call [phone]

## 2017-08-15 NOTE — Progress Notes (Signed)
Transferred to room 1342 via bed, Report given to RN.

## 2017-08-16 ENCOUNTER — Inpatient Hospital Stay (HOSPITAL_COMMUNITY): Payer: Medicare Other

## 2017-08-16 DIAGNOSIS — I10 Essential (primary) hypertension: Secondary | ICD-10-CM

## 2017-08-16 DIAGNOSIS — Z86711 Personal history of pulmonary embolism: Secondary | ICD-10-CM

## 2017-08-16 LAB — CBC
HEMATOCRIT: 24.8 % — AB (ref 36.0–46.0)
Hemoglobin: 8.5 g/dL — ABNORMAL LOW (ref 12.0–15.0)
MCH: 26.6 pg (ref 26.0–34.0)
MCHC: 34.3 g/dL (ref 30.0–36.0)
MCV: 77.5 fL — AB (ref 78.0–100.0)
PLATELETS: 249 10*3/uL (ref 150–400)
RBC: 3.2 MIL/uL — AB (ref 3.87–5.11)
RDW: 20.3 % — ABNORMAL HIGH (ref 11.5–15.5)
WBC: 21.1 10*3/uL — AB (ref 4.0–10.5)

## 2017-08-16 LAB — BASIC METABOLIC PANEL
ANION GAP: 6 (ref 5–15)
CHLORIDE: 110 mmol/L (ref 101–111)
CO2: 18 mmol/L — AB (ref 22–32)
Calcium: 7.5 mg/dL — ABNORMAL LOW (ref 8.9–10.3)
Creatinine, Ser: 0.42 mg/dL — ABNORMAL LOW (ref 0.44–1.00)
GFR calc Af Amer: >60 mL/min (ref >60)
Glucose, Bld: 124 mg/dL — ABNORMAL HIGH (ref 65–99)
POTASSIUM: 4.4 mmol/L (ref 3.5–5.1)
Sodium: 134 mmol/L — ABNORMAL LOW (ref 135–145)

## 2017-08-16 LAB — GLUCOSE, CAPILLARY
GLUCOSE-CAPILLARY: 113 mg/dL — AB (ref 65–99)
GLUCOSE-CAPILLARY: 118 mg/dL — AB (ref 65–99)
Glucose-Capillary: 56 mg/dL — ABNORMAL LOW (ref 65–99)
Glucose-Capillary: 87 mg/dL (ref 65–99)
Glucose-Capillary: 100 mg/dL — ABNORMAL HIGH (ref 65–99)

## 2017-08-16 MED ORDER — ADULT MULTIVITAMIN W/MINERALS CH: 1.0000 | ORAL_TABLET | Freq: Every day | ORAL | 0 refills | Status: AC

## 2017-08-16 MED ORDER — INSULIN GLARGINE 100 UNIT/ML ~~LOC~~ SOLN
5.0000 [IU] | Freq: Every day | SUBCUTANEOUS | Status: DC
  Administered 2017-08-16 – 2017-08-17 (×2): 5 [IU] via SUBCUTANEOUS
  Filled 2017-08-16 (×2): qty 0.05

## 2017-08-16 MED ORDER — VANCOMYCIN 50 MG/ML ORAL SOLUTION
125.0000 mg | Freq: Four times a day (QID) | ORAL | Status: DC
  Administered 2017-08-16 – 2017-08-17 (×3): 125 mg via ORAL
  Filled 2017-08-16 (×5): qty 2.5

## 2017-08-16 MED ORDER — FAMOTIDINE 20 MG PO TABS: 20.0000 mg | ORAL_TABLET | Freq: Two times a day (BID) | ORAL | 0 refills | Status: DC

## 2017-08-16 NOTE — Consult Note (Signed)
Urology Consult  Referring physician: Dr. Cathlean Sauer Reason for referral: left nephrolithiasis, recurrent UTI  Chief Complaint: urinary urgency  History of Present Illness: Jocelyn Sanchez is a 62yo with a hx od DMII, Jocelyn who was admitted on 2/19 with sepsis from a urinary source. Here blood culture grew Proteus. She underwent CT scan for persistent fever on 2/26 and was found to have a left 1.6cm UPJ calculus with mild hydronephrosis. She was having mild abdominal pain at that time which has since resolved. The pain was dull, mild, constant and nonraditing. She had associated urinary urgency and frequency which has since resolved. Followup renal US from today shows the stone is now in the renal pelvis and the hydronephrosis has resolved. Patient denies any flank or abdominal pain. No LUTS. No hematuria. No fevers.    Past Medical History:  Diagnosis Date  . Anemia   . DM II (diabetes mellitus, type II), controlled ([place])   . Gait disorder   . HTN (hypertension)   . Lymphedema   . Jocelyn (multiple sclerosis) ([place])   . Pulmonary embolism [facility])    Past Surgical History:  Procedure Laterality Date  . ESOPHAGOGASTRODUODENOSCOPY N/A 07/12/2016   Procedure: ESOPHAGOGASTRODUODENOSCOPY (EGD);  Surgeon: Irene Shipper, MD;  Location: [facility];  Service: Endoscopy;  Laterality: N/A;  . FLEXIBLE SIGMOIDOSCOPY Left 03/09/2015   Procedure: FLEXIBLE SIGMOIDOSCOPY;  Surgeon: Carol Ada, MD;  Location: [facility];  Service: Endoscopy;  Laterality: Left;    Medications: I have reviewed the patient's current medications. Allergies:  Allergies  Allergen Reactions  . Sulfa Antibiotics Shortness Of Breath and Swelling    Family History  Problem Relation Age of Onset  . Multiple sclerosis Mother    Social History:  reports that she has quit smoking. Her smoking use included cigarettes. she has never used smokeless tobacco. She reports that she does not drink alcohol or use drugs.  Review of Systems  All other  systems reviewed and are negative.   Physical Exam:  Vital signs in last 24 hours: Temp:  [98.4 F (36.9 C)-99.4 F (37.4 C)] 99.4 F (37.4 C) (02/28 2018) Pulse Rate:  [82-88] 82 (02/28 2018) Resp:  [12-16] 16 (02/28 2018) BP: (134-140)/(72-84) 139/72 (02/28 2018) SpO2:  [90 %-100 %] 99 % (02/28 2018) Weight:  [80.3 kg (177 lb)] 80.3 kg (177 lb) (02/28 0504) Physical Exam  Constitutional: She is oriented to person, place, and time. She appears well-developed and well-nourished.  HENT:  Head: Normocephalic and atraumatic.  Eyes: EOM are normal. Pupils are equal, round, and reactive to light.  Neck: Normal range of motion.  Cardiovascular: Normal rate and regular rhythm.  Respiratory: Effort normal. No respiratory distress.  GI: Soft. She exhibits no distension. There is no tenderness.  Musculoskeletal: She exhibits no edema or tenderness.  Neurological: She is alert and oriented to person, place, and time.  Skin: Skin is warm and dry.  Psychiatric: She has a normal mood and affect. Her behavior is normal. Judgment and thought content normal.    Laboratory Data:  Results for orders placed or performed during the hospital encounter of 08/07/17 (from the past 72 hour(s))  Glucose, capillary     Status: Abnormal   Collection Time: 08/14/17  7:58 AM  Result Value Ref Range   Glucose-Capillary 136 (H) 65 - 99 mg/dL  Glucose, capillary     Status: Abnormal   Collection Time: 08/14/17 11:47 AM  Result Value Ref Range   Glucose-Capillary 156 (H) 65 - 99 mg/dL  Glucose, capillary     Status: Abnormal   Collection Time: 08/14/17  4:59 PM  Result Value Ref Range   Glucose-Capillary 125 (H) 65 - 99 mg/dL  Glucose, capillary     Status: None   Collection Time: 08/14/17  9:38 PM  Result Value Ref Range   Glucose-Capillary 84 65 - 99 mg/dL  Glucose, capillary     Status: None   Collection Time: 08/15/17  8:05 AM  Result Value Ref Range   Glucose-Capillary 81 65 - 99 mg/dL  Glucose,  capillary     Status: Abnormal   Collection Time: 08/15/17 11:11 AM  Result Value Ref Range   Glucose-Capillary 103 (H) 65 - 99 mg/dL  Glucose, capillary     Status: Abnormal   Collection Time: 08/15/17  6:56 PM  Result Value Ref Range   Glucose-Capillary 143 (H) 65 - 99 mg/dL  Glucose, capillary     Status: None   Collection Time: 08/15/17  9:32 PM  Result Value Ref Range   Glucose-Capillary 89 65 - 99 mg/dL  Glucose, capillary     Status: Abnormal   Collection Time: 08/16/17  7:50 AM  Result Value Ref Range   Glucose-Capillary 56 (L) 65 - 99 mg/dL   Comment 1 Notify RN    Comment 2 Document in Chart   Glucose, capillary     Status: None   Collection Time: 08/16/17  8:44 AM  Result Value Ref Range   Glucose-Capillary 87 65 - 99 mg/dL   Comment 1 Notify RN    Comment 2 Document in Chart   CBC     Status: Abnormal   Collection Time: 08/16/17 11:45 AM  Result Value Ref Range   WBC 21.1 (H) 4.0 - 10.5 K/uL   RBC 3.20 (L) 3.87 - 5.11 MIL/uL   Hemoglobin 8.5 (L) 12.0 - 15.0 g/dL   HCT 24.8 (L) 36.0 - 46.0 %   MCV 77.5 (L) 78.0 - 100.0 fL   MCH 26.6 26.0 - 34.0 pg   MCHC 34.3 30.0 - 36.0 g/dL   RDW 20.3 (H) 11.5 - 15.5 %   Platelets 249 150 - 400 K/uL    Comment: Performed at [facility], [place] [address], [place], [place] [address]  Basic metabolic panel     Status: Abnormal   Collection Time: 08/16/17 11:45 AM  Result Value Ref Range   Sodium 134 (L) 135 - 145 mmol/L   Potassium 4.4 3.5 - 5.1 mmol/L   Chloride 110 101 - 111 mmol/L   CO2 18 (L) 22 - 32 mmol/L   Glucose, Bld 124 (H) 65 - 99 mg/dL   BUN <5 (L) 6 - 20 mg/dL   Creatinine, Ser 0.42 (L) 0.44 - 1.00 mg/dL   Calcium 7.5 (L) 8.9 - 10.3 mg/dL   GFR calc non Af Amer >60 >60 mL/min   GFR calc Af Amer >60 >60 mL/min    Comment: (NOTE) The eGFR has been calculated using the CKD EPI equation. This calculation has not been validated in all clinical situations. eGFR's persistently <60 mL/min signify  possible Chronic Kidney Disease.    Anion gap 6 5 - 15    Comment: Performed at [facility], [place] [address] [place], [place] [address]  Glucose, capillary     Status: Abnormal   Collection Time: 08/16/17 12:08 PM  Result Value Ref Range   Glucose-Capillary 113 (H) 65 - 99 mg/dL   Comment 1 Notify RN  Comment 2 Document in Chart   Glucose, capillary     Status: Abnormal   Collection Time: 08/16/17  5:58 PM  Result Value Ref Range   Glucose-Capillary 100 (H) 65 - 99 mg/dL   Comment 1 Notify RN    Comment 2 Document in Chart   Glucose, capillary     Status: Abnormal   Collection Time: 08/16/17  9:34 PM  Result Value Ref Range   Glucose-Capillary 118 (H) 65 - 99 mg/dL   Recent Results (from the past 240 hour(s))  Urine culture     Status: Abnormal   Collection Time: 08/07/17  9:29 PM  Result Value Ref Range Status   Specimen Description   Final    URINE, CATHETERIZED Performed at [facility], [place] [address] [place], [place] [address]    Special Requests   Final    NONE Performed at [facility], [place] [address] [place], [place] [address]    Culture MULTIPLE SPECIES PRESENT, SUGGEST RECOLLECTION (A)  Final   Report Status 08/09/2017 FINAL  Final  Gastrointestinal Panel by PCR , Stool     Status: None   Collection Time: 08/07/17  9:37 PM  Result Value Ref Range Status   Campylobacter species NOT DETECTED NOT DETECTED Final   Plesimonas shigelloides NOT DETECTED NOT DETECTED Final   Salmonella species NOT DETECTED NOT DETECTED Final   Yersinia enterocolitica NOT DETECTED NOT DETECTED Final   Vibrio species NOT DETECTED NOT DETECTED Final   Vibrio cholerae NOT DETECTED NOT DETECTED Final   Enteroaggregative E coli (EAEC) NOT DETECTED NOT DETECTED Final   Enteropathogenic E coli (EPEC) NOT DETECTED NOT DETECTED Final   Enterotoxigenic E coli (ETEC) NOT DETECTED NOT DETECTED Final   Shiga like toxin producing E coli  (STEC) NOT DETECTED NOT DETECTED Final   Shigella/Enteroinvasive E coli (EIEC) NOT DETECTED NOT DETECTED Final   Cryptosporidium NOT DETECTED NOT DETECTED Final   Cyclospora cayetanensis NOT DETECTED NOT DETECTED Final   Entamoeba histolytica NOT DETECTED NOT DETECTED Final   Giardia lamblia NOT DETECTED NOT DETECTED Final   Adenovirus F40/41 NOT DETECTED NOT DETECTED Final   Astrovirus NOT DETECTED NOT DETECTED Final   Norovirus GI/GII NOT DETECTED NOT DETECTED Final   Rotavirus A NOT DETECTED NOT DETECTED Final   Sapovirus (I, II, IV, and V) NOT DETECTED NOT DETECTED Final    Comment: Performed at [facility], [place] [place], [place] [address]  Blood Culture (routine x 2)     Status: Abnormal   Collection Time: 08/07/17  9:57 PM  Result Value Ref Range Status   Specimen Description   Final    BLOOD RIGHT HAND Performed at [facility], [place] [address] [place], [place] [address]    Special Requests   Final    BOTTLES DRAWN AEROBIC AND ANAEROBIC Blood Culture adequate volume   Culture  Setup Time   Final    GRAM NEGATIVE RODS IN BOTH AEROBIC AND ANAEROBIC BOTTLES CRITICAL RESULT CALLED TO, READ BACK BY AND VERIFIED WITH: Christean Grief Pharm.D. 13:45 08/08/17 ([email or web address]) Performed at [facility], [place] [address] [place], [place] [address]    Culture PROTEUS MIRABILIS (A)  Final   Report Status 08/10/2017 FINAL  Final   Organism ID, Bacteria PROTEUS MIRABILIS  Final      Susceptibility   Proteus mirabilis - MIC*    AMPICILLIN <=2 SENSITIVE Sensitive     CEFAZOLIN <=4 SENSITIVE Sensitive  CEFEPIME <=1 SENSITIVE Sensitive     CEFTAZIDIME <=1 SENSITIVE Sensitive     CEFTRIAXONE <=1 SENSITIVE Sensitive     CIPROFLOXACIN 2 INTERMEDIATE Intermediate     GENTAMICIN <=1 SENSITIVE Sensitive     IMIPENEM 2 SENSITIVE Sensitive     TRIMETH/SULFA <=20 SENSITIVE Sensitive     AMPICILLIN/SULBACTAM <=2 SENSITIVE Sensitive     PIP/TAZO <=4 SENSITIVE  Sensitive     * PROTEUS MIRABILIS  Blood Culture ID Panel (Reflexed)     Status: Abnormal   Collection Time: 08/07/17  9:57 PM  Result Value Ref Range Status   Enterococcus species NOT DETECTED NOT DETECTED Final   Listeria monocytogenes NOT DETECTED NOT DETECTED Final   Staphylococcus species NOT DETECTED NOT DETECTED Final   Staphylococcus aureus NOT DETECTED NOT DETECTED Final   Streptococcus species NOT DETECTED NOT DETECTED Final   Streptococcus agalactiae NOT DETECTED NOT DETECTED Final   Streptococcus pneumoniae NOT DETECTED NOT DETECTED Final   Streptococcus pyogenes NOT DETECTED NOT DETECTED Final   Acinetobacter baumannii NOT DETECTED NOT DETECTED Final   Enterobacteriaceae species DETECTED (A) NOT DETECTED Final    Comment: Enterobacteriaceae represent a large family of gram-negative bacteria, not a single organism. CRITICAL RESULT CALLED TO, READ BACK BY AND VERIFIED WITH: Christean Grief Pharm.D. 13:45 08/08/17 ([email or web address])    Enterobacter cloacae complex NOT DETECTED NOT DETECTED Final   Escherichia coli NOT DETECTED NOT DETECTED Final   Klebsiella oxytoca NOT DETECTED NOT DETECTED Final   Klebsiella pneumoniae NOT DETECTED NOT DETECTED Final   Proteus species DETECTED (A) NOT DETECTED Final    Comment: CRITICAL RESULT CALLED TO, READ BACK BY AND VERIFIED WITH: Mila Merry.D. 13:45 08/08/17 ([email or web address])    Serratia marcescens NOT DETECTED NOT DETECTED Final   Carbapenem resistance NOT DETECTED NOT DETECTED Final   Haemophilus influenzae NOT DETECTED NOT DETECTED Final   Neisseria meningitidis NOT DETECTED NOT DETECTED Final   Pseudomonas aeruginosa NOT DETECTED NOT DETECTED Final   Candida albicans NOT DETECTED NOT DETECTED Final   Candida glabrata NOT DETECTED NOT DETECTED Final   Candida krusei NOT DETECTED NOT DETECTED Final   Candida parapsilosis NOT DETECTED NOT DETECTED Final   Candida tropicalis NOT DETECTED NOT DETECTED Final  Blood Culture (routine x 2)     Status:  None   Collection Time: 08/07/17 10:48 PM  Result Value Ref Range Status   Specimen Description   Final    BLOOD BLOOD RIGHT FOREARM Performed at [place] [address], [place], [place] [address]    Special Requests   Final    BOTTLES DRAWN AEROBIC AND ANAEROBIC Blood Culture results may not be optimal due to an inadequate volume of blood received in culture bottles   Culture   Final    NO GROWTH 5 DAYS Performed at [facility], [place] [address] [place], [place] [address]    Report Status 08/13/2017 FINAL  Final  MRSA PCR Screening     Status: Abnormal   Collection Time: 08/08/17 12:40 AM  Result Value Ref Range Status   MRSA by PCR POSITIVE (A) NEGATIVE Final    Comment:        The GeneXpert MRSA Assay (FDA approved for NASAL specimens only), is one component of a comprehensive MRSA colonization surveillance program. It is not intended to diagnose MRSA infection nor to guide or monitor treatment for MRSA infections. RESULT CALLED TO, READ BACK BY AND VERIFIED WITH: LACIVITA,H RN 2.20.19 '@[email or web address]'  ZANDO,C Performed at  [facility], [place] [address] [place], [place] [address]   C difficile quick scan w PCR reflex     Status: Abnormal   Collection Time: 08/10/17  9:00 PM  Result Value Ref Range Status   C Diff antigen POSITIVE (A) NEGATIVE Final   C Diff toxin NEGATIVE NEGATIVE Final   C Diff interpretation Results are indeterminate. See PCR results.  Final    Comment: Performed at [facility], [place] [address] [place], [place] [address]  C. Diff by PCR, Reflexed     Status: None   Collection Time: 08/10/17  9:00 PM  Result Value Ref Range Status   Toxigenic C. Difficile by PCR NEGATIVE NEGATIVE Final    Comment: Patient is colonized with non toxigenic C. difficile. May not need treatment unless significant symptoms are present. Performed at [facility], [place] [address] [place], [place] [address]     Creatinine: Recent Labs    08/10/17 1281 08/11/17 0545 08/13/17 0506 08/16/17 1145  CREATININE 0.75 0.62 0.60 0.42*   Baseline Creatinine: 0/7  Impression/Assessment:  61yo with left nephrolithiasis and UTI  Plan:  I discussed the management of large renal calculi and the management including ureteral stent placement, nephrostomy tube placement, and observation. Currently the patient is asymptomatic and the stone is not obstructing. The patient was to observe the calculi and I will have her followup in 2-3 weeks after discharge for repeat imaging. If she develops worsening flank/abdominal pain or fever she will require urgent decompression of her left collecting system.Jocelyn Sanchez 08/16/2017, 11:21 PM

## 2017-08-16 NOTE — Discharge Summary (Addendum)
Physician Discharge Summary  Jocelyn Sanchez UXN:[id number] DOB: 1956-03-18 DOA: 08/07/2017  PCP: Dixie Dials, MD  Admit date: 08/07/2017 Discharge date: 08/16/2017  Admitted From: Home Disposition:  Home  Recommendations for Outpatient Follow-up and new medication changes:  1. Follow up with PCP in 1- week 2. Patient will continue diet control for type 2 diabetes mellitus. 3. Patient with persistent diarrhea per rectal tube started on oral Vancomycin for 14 days.  4. Follow up with Dr Alyson Ingles 2 to 3 weeks, Urology, for repeat imaging.   Home Health: Yes Equipment/Devices: no     Discharge Condition: stable CODE STATUS: dnr  Diet recommendation:  Heart healthy and diabetic prudent  Brief/Interim Summary: 62 year old female who presented with shock.  She does have a significant past medical history for multiple sclerosis, bedridden, type 2 diabetes mellitus, hypertension and pulmonary embolism.  He had 3-day history of nausea, vomiting and diarrhea, on the initial physical examination she was febrile, tachycardic and hypotensive.  She was awake and alert, dry mucous membranes, heart S1-S2 present, tachycardic, lungs with no wheezing, rales or rhonchi, abdomen was soft nontender, lower extremity with trace edema.  Sodium 128, potassium 4.0, chloride 97, bicarb 14, glucose 345, BUN 46, creatinine 1.95, calcium 7.6, white count 23.6, hemoglobin 7.6, hematocrit 23.1, platelets 265.  Urinalysis with too numerous to count white cells, too numerous counts RBCs, specific gravity 1.018, protein greater than 300.  C. difficile antigen positive.  Chest x-ray with bibasilar atelectasis more left than right, film hypoinflated with left-sided rotation.  EKG sinus tachycardia, left axis deviation, normal intervals.   Patient was admitted to the hospital with the working diagnosis of septic shock related to urinary tract infection.   1.  Septic shock due to urinary tract infection.  Patient was admitted  to the intensive care unit, she was refractive to volume resuscitation, central venous catheter was placed and patient received vasopressors.  IV broad-spectrum antibiotic with vancomycin and meropenem.  Blood culture was positive for Proteus mirabilis.  She responded well to medical therapy, vasopressors were discontinued, antibiotic therapy was narrowed, amoxicillin clavulanate, she completed 8 days of antibiotic therapy.  She did have persistent loose stools. Patient had persistent fever, follow-up CT of the abdomen was negative for acute changes, it did show notable large nonobstructive calculi in the collecting systems of both kidneys, left ureterovesical junction stone which measures 16 mm in diameter with mild proximal left hydronephrosis.   2.  Sacral decubitus ulcer.  Stage II ulcer, quarter size, continue local wound care.  3.  Diarrhea due to C diff. Persistent watery diarrhea per rectal tube, patient tested positive for C diff antigen. Placed on oral Vancomycin with improvement of diarrhea.   4.  Multiple sclerosis.  Continue home care, frequent turning.  High risk for pressure ulcers.  5.  Pulmonary embolism.  Continue anticoagulation with apixaban.   6.  Hypertension. Blood pressure control with carvedilol.    7.  Diastolic heart failure, stable no exacerbation. Echocardiography showed left ventricle ejection fraction 40-45% with diffuse hypokinesis. Positive hypokinesis of the inferior lateral myocardium.  Continue diuretic therapy per home regimen with furosemide every 48 hours as scheduled.  At discharge we will continue Entresto.   8.  Nephrolithiasis.  Initial CT scan showed stone at the ureterovesical junction with mild left hydronephrosis, follow-up ultrasonography of the kidneys was negative for hydronephrosis.  Patient will need to follow-up with urology due to nephrolithiasis, and risk of recurrent urinary tract infections.   9.  Type  2 diabetes mellitus.  Patient was placed  on insulin sliding scale for glucose coverage monitoring along with basal insulin, at discharge will resume glipizide and diet controlled.  Will need close follow-up as an outpatient  Discharge Diagnoses:  Principal Problem:   Fever Active Problems:   Multiple sclerosis ([place])   Diabetes mellitus with peripheral vascular disease ([place])   Leukocytosis   Anemia of chronic disease   Decubitus ulcer of sacral region, stage 3 (HCC)   Essential hypertension, benign   PVD (peripheral vascular disease) (HCC)   Hx of pulmonary embolus   Proteus mirabilis infection   Diarrhea   Bacteremia    Discharge Instructions   Allergies as of 08/16/2017      Reactions   Sulfa Antibiotics Shortness Of Breath, Swelling      Medication List    STOP taking these medications   acetaminophen 325 MG tablet Commonly known as:  TYLENOL   cephALEXin 500 MG capsule Commonly known as:  KEFLEX   ciprofloxacin 250 MG tablet Commonly known as:  CIPRO   docusate sodium 100 MG capsule Commonly known as:  COLACE   pantoprazole 40 MG tablet Commonly known as:  PROTONIX   promethazine 25 MG tablet Commonly known as:  PHENERGAN   zolpidem 5 MG tablet Commonly known as:  AMBIEN     TAKE these medications   ALPRAZolam 0.5 MG tablet Commonly known as:  XANAX Take 0.5 mg by mouth 2 (two) times daily as needed for anxiety.   apixaban 2.5 MG Tabs tablet Commonly known as:  ELIQUIS Take 1 tablet (2.5 mg total) by mouth 2 (two) times daily.   carvedilol 3.125 MG tablet Commonly known as:  COREG Take 3.125 mg by mouth 2 (two) times daily.   famotidine 20 MG tablet Commonly known as:  PEPCID Take 1 tablet (20 mg total) by mouth 2 (two) times daily.   furosemide 20 MG tablet Commonly known as:  LASIX Take 20 mg by mouth daily. On Monday, Wednesday and Friday   glipiZIDE 5 MG tablet Commonly known as:  GLUCOTROL Take 1 tablet (5 mg total) by mouth 2 (two) times daily.   HYDROcodone-acetaminophen  5-325 MG tablet Commonly known as:  NORCO/VICODIN Take 2 tablets by mouth every 4 (four) hours as needed for moderate pain.   methocarbamol 500 MG tablet Commonly known as:  ROBAXIN Take 0.5 tablets (250 mg total) by mouth every 8 (eight) hours as needed for muscle spasms.   multivitamin with minerals Tabs tablet Take 1 tablet by mouth daily.   potassium chloride 10 MEQ tablet Commonly known as:  K-DUR,KLOR-CON Take 10 mEq by mouth daily. On Monday, Wednesday and Friday   sacubitril-valsartan 24-26 MG Commonly known as:  ENTRESTO Take 1 tablet by mouth 2 (two) times daily.            Durable Medical Equipment  (From admission, onward)        Start     Ordered   08/09/17 1533  For home use only DME Other see comment  Once    Comments:  Mattress-alternating pressure pad   08/09/17 1533      Allergies  Allergen Reactions  . Sulfa Antibiotics Shortness Of Breath and Swelling    Consultations:  I.D  GI   Procedures/Studies: Dg Chest 1 View  Result Date: 08/10/2017 CLINICAL DATA:  Onset tachypnea - diabetic - hx hypertension - hx pulmonary embolism - former smoker EXAM: CHEST  1 VIEW COMPARISON:  08/09/2017 FINDINGS: Bibasilar  airspace opacities left greater than right, stable. Suspect small left pleural effusion as before. Heart size upper limits normal. Aortic Atherosclerosis (ICD10-170.0) Stable right IJ central venous catheter.  No pneumothorax. Visualized bones unremarkable. IMPRESSION: Stable appearance of bibasilar airspace disease, left greater than right, and small left effusion. Electronically Signed   By: Lucrezia Europe M.D.   On: 08/10/2017 19:43   Ct Abdomen Pelvis W Contrast  Result Date: 08/14/2017 CLINICAL DATA:  62 year old female with history of a history of sepsis. Diarrhea. EXAM: CT ABDOMEN AND PELVIS WITH CONTRAST TECHNIQUE: Multidetector CT imaging of the abdomen and pelvis was performed using the standard protocol following bolus administration of  intravenous contrast. CONTRAST:  180mL ISOVUE-300 IOPAMIDOL (ISOVUE-300) INJECTION 61%, 34mL ISOVUE-300 IOPAMIDOL (ISOVUE-300) INJECTION 61% COMPARISON:  CT the abdomen and pelvis 03/07/2015. FINDINGS: Lower chest: Small bilateral pleural effusions lying dependently. Atherosclerotic calcifications in the left main, left anterior descending, left circumflex and right coronary arteries. Hepatobiliary: No suspicious cystic or solid hepatic lesions. No intra or extrahepatic biliary ductal dilatation. Gallbladder is nearly completely decompressed, but otherwise unremarkable in appearance. Pancreas: No pancreatic mass. No pancreatic ductal dilatation. No pancreatic or peripancreatic fluid or inflammatory changes. Spleen: Unremarkable. Adrenals/Urinary Tract: Multiple nonobstructive calculi are noted within the collecting systems of both kidneys, measuring up to 15 mm in the left renal pelvis. In addition, at the left ureteropelvic junction there is a 16 mm calculus (axial image 39 of series 2). This is associated with mild proximal left hydronephrosis. No additional calculi are noted along the course of the right ureter. In the base of the urinary bladder there are small calculi measuring up to 4 mm (axial image 75 of series 2). Small amount of gas non dependently in the lumen of the urinary bladder. No suspicious renal lesions. Bilateral adrenal glands are normal in appearance. Stomach/Bowel: The appearance of the stomach is normal. There is no pathologic dilatation of small bowel or colon. Rectal bag in place. Normal appendix. Vascular/Lymphatic: Aortic atherosclerosis, without evidence of aneurysm or dissection in the abdominal or pelvic vasculature. No lymphadenopathy noted in the abdomen or pelvis. Reproductive: Exophytic enhancing lesion extending from the left side of the uterine fundus, likely a fibroid measuring 2.4 cm. In addition, there is a larger less avidly enhancing lesion extending off the dorsal aspect of  the uterine body measuring 4.4 cm, likely to represent a second fibroid. Ovaries are unremarkable in appearance. Other: No significant volume of ascites.  No pneumoperitoneum. Musculoskeletal: There are no aggressive appearing lytic or blastic lesions noted in the visualized portions of the skeleton. IMPRESSION: 1. In addition to multiple large nonobstructive calculi in the collecting systems of both kidneys there is a left ureterovesicular junction stone which measures 16 mm in diameter, which is associated with mild proximal left hydronephrosis. 2. Several tiny calculi also noted in the urinary bladder, measuring up to 4 mm. 3. No other definite acute findings noted in the abdomen or pelvis to account for the patient's symptoms. 4. Small bilateral pleural effusions lying dependently. 5. Aortic atherosclerosis, in addition to at least 3 vessel coronary artery disease. Please note that although the presence of coronary artery calcium documents the presence of coronary artery disease, the severity of this disease and any potential stenosis cannot be assessed on this non-gated CT examination. Assessment for potential risk factor modification, dietary therapy or pharmacologic therapy may be warranted, if clinically indicated. 6. Additional incidental findings, as above. Electronically Signed   By: Vinnie Langton M.D.   On:  08/14/2017 19:53   Dg Chest Port 1 View  Result Date: 08/09/2017 CLINICAL DATA:  Pneumonia EXAM: PORTABLE CHEST 1 VIEW COMPARISON:  08/08/2017 FINDINGS: Central venous catheter tip at the cavoatrial junction unchanged. Progression of left lower lobe consolidation. Probable small effusion. Mild right lower lobe airspace disease unchanged. Negative for heart failure. IMPRESSION: Progressive left lower lobe atelectasis/infiltrate and small left effusion. Electronically Signed   By: Franchot Gallo M.D.   On: 08/09/2017 07:23   Dg Chest Port 1 View  Result Date: 08/08/2017 CLINICAL DATA:   Central line placement. EXAM: PORTABLE CHEST 1 VIEW COMPARISON:  Chest radiograph August 08, 2017 FINDINGS: RIGHT internal jugular central venous catheter distal tip projects in distal superior vena cava. No pneumothorax. LEFT lung base alveolar airspace opacity with small LEFT pleural effusion. Fullness of the hilar vascular shadows. Cardiac silhouette is normal. Apical pleural thickening. Soft tissue planes and included osseous structures are unchanged. IMPRESSION: Stable LEFT lung base airspace opacity concerning for pneumonia with small LEFT pleural effusion. RIGHT internal jugular central venous catheter distal tip projects in distal superior vena cava. No pneumothorax. Prominent pulmonary arteries associated with pulmonary arterial hypertension. Electronically Signed   By: Elon Alas M.D.   On: 08/08/2017 02:14   Dg Chest Port 1 View  Result Date: 08/08/2017 CLINICAL DATA:  Vomiting EXAM: PORTABLE CHEST 1 VIEW COMPARISON:  07/11/2016 FINDINGS: Small left pleural effusion and left basilar infiltrate. Borderline cardiomegaly. Aortic atherosclerosis. No pneumothorax. Right lung is clear. IMPRESSION: Left basilar airspace disease suspicious for pneumonia with probable small left pleural effusion. Electronically Signed   By: Donavan Foil M.D.   On: 08/08/2017 00:27       Subjective: Patient is feeling well, no nausea, no vomiting no abdominal pain, no dyspnea.  Improve appetite and p.o. intake.  Discharge Exam: Vitals:   08/15/17 2125 08/16/17 0504  BP: 136/89 134/84  Pulse: 88 85  Resp: 16 12  Temp: 98.6 F (37 C) 98.4 F (36.9 C)  SpO2: 100% 100%   Vitals:   08/15/17 1422 08/15/17 1549 08/15/17 2125 08/16/17 0504  BP:  132/69 136/89 134/84  Pulse: 77 77 88 85  Resp: 13 16 16 12   Temp:  98.6 F (37 C) 98.6 F (37 C) 98.4 F (36.9 C)  TempSrc:  Oral Oral Oral  SpO2: 100% 100% 100% 100%  Weight:    80.3 kg (177 lb)  Height:        General: Not in pain or  dyspnea Neurology: Awake and alert, non focal  E ENT: mild pallor, no icterus, oral mucosa moist Cardiovascular: No JVD. S1-S2 present, rhythmic, no gallops, rubs, or murmurs. No lower extremity edema. Pulmonary: decreased breath sounds bilaterally due to poor inspiratory effort, adequate air movement, no wheezing, rhonchi or rales. Gastrointestinal. Abdomen protuberant, no organomegaly, non tender, no rebound or guarding Skin. No rashes Musculoskeletal: no joint deformities   The results of significant diagnostics from this hospitalization (including imaging, microbiology, ancillary and laboratory) are listed below for reference.     Microbiology: Recent Results (from the past 240 hour(s))  Urine culture     Status: Abnormal   Collection Time: 08/07/17  9:29 PM  Result Value Ref Range Status   Specimen Description   Final    URINE, CATHETERIZED Performed at [facility] [address], [place], [place] [address]    Special Requests   Final    NONE Performed at [facility], [place] [address] [place], [place] [address]  Culture MULTIPLE SPECIES PRESENT, SUGGEST RECOLLECTION (A)  Final   Report Status 08/09/2017 FINAL  Final  Gastrointestinal Panel by PCR , Stool     Status: None   Collection Time: 08/07/17  9:37 PM  Result Value Ref Range Status   Campylobacter species NOT DETECTED NOT DETECTED Final   Plesimonas shigelloides NOT DETECTED NOT DETECTED Final   Salmonella species NOT DETECTED NOT DETECTED Final   Yersinia enterocolitica NOT DETECTED NOT DETECTED Final   Vibrio species NOT DETECTED NOT DETECTED Final   Vibrio cholerae NOT DETECTED NOT DETECTED Final   Enteroaggregative E coli (EAEC) NOT DETECTED NOT DETECTED Final   Enteropathogenic E coli (EPEC) NOT DETECTED NOT DETECTED Final   Enterotoxigenic E coli (ETEC) NOT DETECTED NOT DETECTED Final   Shiga like toxin producing E coli (STEC) NOT DETECTED NOT DETECTED Final    Shigella/Enteroinvasive E coli (EIEC) NOT DETECTED NOT DETECTED Final   Cryptosporidium NOT DETECTED NOT DETECTED Final   Cyclospora cayetanensis NOT DETECTED NOT DETECTED Final   Entamoeba histolytica NOT DETECTED NOT DETECTED Final   Giardia lamblia NOT DETECTED NOT DETECTED Final   Adenovirus F40/41 NOT DETECTED NOT DETECTED Final   Astrovirus NOT DETECTED NOT DETECTED Final   Norovirus GI/GII NOT DETECTED NOT DETECTED Final   Rotavirus A NOT DETECTED NOT DETECTED Final   Sapovirus (I, II, IV, and V) NOT DETECTED NOT DETECTED Final    Comment: Performed at [facility], [place] [place], [place] [address]  Blood Culture (routine x 2)     Status: Abnormal   Collection Time: 08/07/17  9:57 PM  Result Value Ref Range Status   Specimen Description   Final    BLOOD RIGHT HAND Performed at [facility], [place] [address] [place], [place] [address]    Special Requests   Final    BOTTLES DRAWN AEROBIC AND ANAEROBIC Blood Culture adequate volume   Culture  Setup Time   Final    GRAM NEGATIVE RODS IN BOTH AEROBIC AND ANAEROBIC BOTTLES CRITICAL RESULT CALLED TO, READ BACK BY AND VERIFIED WITH: Christean Grief Pharm.D. 13:45 08/08/17 ([email or web address]) Performed at [facility], [place] [address] [place], [place] [address]    Culture PROTEUS MIRABILIS (A)  Final   Report Status 08/10/2017 FINAL  Final   Organism ID, Bacteria PROTEUS MIRABILIS  Final      Susceptibility   Proteus mirabilis - MIC*    AMPICILLIN <=2 SENSITIVE Sensitive     CEFAZOLIN <=4 SENSITIVE Sensitive     CEFEPIME <=1 SENSITIVE Sensitive     CEFTAZIDIME <=1 SENSITIVE Sensitive     CEFTRIAXONE <=1 SENSITIVE Sensitive     CIPROFLOXACIN 2 INTERMEDIATE Intermediate     GENTAMICIN <=1 SENSITIVE Sensitive     IMIPENEM 2 SENSITIVE Sensitive     TRIMETH/SULFA <=20 SENSITIVE Sensitive     AMPICILLIN/SULBACTAM <=2 SENSITIVE Sensitive     PIP/TAZO <=4 SENSITIVE Sensitive     * PROTEUS MIRABILIS  Blood  Culture ID Panel (Reflexed)     Status: Abnormal   Collection Time: 08/07/17  9:57 PM  Result Value Ref Range Status   Enterococcus species NOT DETECTED NOT DETECTED Final   Listeria monocytogenes NOT DETECTED NOT DETECTED Final   Staphylococcus species NOT DETECTED NOT DETECTED Final   Staphylococcus aureus NOT DETECTED NOT DETECTED Final   Streptococcus species NOT DETECTED NOT DETECTED Final   Streptococcus agalactiae NOT DETECTED NOT DETECTED Final   Streptococcus pneumoniae NOT DETECTED NOT DETECTED Final  Streptococcus pyogenes NOT DETECTED NOT DETECTED Final   Acinetobacter baumannii NOT DETECTED NOT DETECTED Final   Enterobacteriaceae species DETECTED (A) NOT DETECTED Final    Comment: Enterobacteriaceae represent a large family of gram-negative bacteria, not a single organism. CRITICAL RESULT CALLED TO, READ BACK BY AND VERIFIED WITH: Christean Grief Pharm.D. 13:45 08/08/17 ([email or web address])    Enterobacter cloacae complex NOT DETECTED NOT DETECTED Final   Escherichia coli NOT DETECTED NOT DETECTED Final   Klebsiella oxytoca NOT DETECTED NOT DETECTED Final   Klebsiella pneumoniae NOT DETECTED NOT DETECTED Final   Proteus species DETECTED (A) NOT DETECTED Final    Comment: CRITICAL RESULT CALLED TO, READ BACK BY AND VERIFIED WITH: Mila Merry.D. 13:45 08/08/17 ([email or web address])    Serratia marcescens NOT DETECTED NOT DETECTED Final   Carbapenem resistance NOT DETECTED NOT DETECTED Final   Haemophilus influenzae NOT DETECTED NOT DETECTED Final   Neisseria meningitidis NOT DETECTED NOT DETECTED Final   Pseudomonas aeruginosa NOT DETECTED NOT DETECTED Final   Candida albicans NOT DETECTED NOT DETECTED Final   Candida glabrata NOT DETECTED NOT DETECTED Final   Candida krusei NOT DETECTED NOT DETECTED Final   Candida parapsilosis NOT DETECTED NOT DETECTED Final   Candida tropicalis NOT DETECTED NOT DETECTED Final  Blood Culture (routine x 2)     Status: None   Collection Time: 08/07/17 10:48 PM   Result Value Ref Range Status   Specimen Description   Final    BLOOD BLOOD RIGHT FOREARM Performed at [facility] [address] [place], [place] [address]    Special Requests   Final    BOTTLES DRAWN AEROBIC AND ANAEROBIC Blood Culture results may not be optimal due to an inadequate volume of blood received in culture bottles   Culture   Final    NO GROWTH 5 DAYS Performed at [facility], [place] [address], [place], [place] [address]    Report Status 08/13/2017 FINAL  Final  MRSA PCR Screening     Status: Abnormal   Collection Time: 08/08/17 12:40 AM  Result Value Ref Range Status   MRSA by PCR POSITIVE (A) NEGATIVE Final    Comment:        The GeneXpert MRSA Assay (FDA approved for NASAL specimens only), is one component of a comprehensive MRSA colonization surveillance program. It is not intended to diagnose MRSA infection nor to guide or monitor treatment for MRSA infections. RESULT CALLED TO, READ BACK BY AND VERIFIED WITH: LACIVITA,H RN 2.20.19 @346  ZANDO,C Performed at [facility] [place] [address], [place], [place] [address]   C difficile quick scan w PCR reflex     Status: Abnormal   Collection Time: 08/10/17  9:00 PM  Result Value Ref Range Status   C Diff antigen POSITIVE (A) NEGATIVE Final   C Diff toxin NEGATIVE NEGATIVE Final   C Diff interpretation Results are indeterminate. See PCR results.  Final    Comment: Performed at [facility], [place] [address] [place], [place] [address]  C. Diff by PCR, Reflexed     Status: None   Collection Time: 08/10/17  9:00 PM  Result Value Ref Range Status   Toxigenic C. Difficile by PCR NEGATIVE NEGATIVE Final    Comment: Patient is colonized with non toxigenic C. difficile. May not need treatment unless significant symptoms are present. Performed at [facility], [place] [address] [place], [place] [address]      Labs: BNP (last 3 results)  Recent Labs     08/07/17 2129  BNP 78.2   Basic Metabolic Panel: Recent Labs  Lab 08/09/17 2057 08/10/17 0504 08/11/17 0545 08/13/17 0506  NA 132* 134* 134* 138  K 3.6 4.3 4.4 4.6  CL 108 111 111 114*  CO2 15* 15* 16* 17*  GLUCOSE 137* 143* 239* 78  BUN 26* 25* 18 12  CREATININE 0.84 0.75 0.62 0.60  CALCIUM 7.3* 7.1* 7.5* 7.9*  MG  --  1.6*  --  1.2*   Liver Function Tests: No results for input(s): AST, ALT, ALKPHOS, BILITOT, PROT, ALBUMIN in the last 168 hours. No results for input(s): LIPASE, AMYLASE in the last 168 hours. No results for input(s): AMMONIA in the last 168 hours. CBC: Recent Labs  Lab 08/11/17 0545 08/12/17 0545 08/13/17 0506  WBC 20.6* 22.1* 22.3*  HGB 7.4* 7.0* 8.9*  HCT 22.0* 20.2* 25.9*  MCV 71.0* 71.1* 74.2*  PLT 69* 88* 99*   Cardiac Enzymes: No results for input(s): CKTOTAL, CKMB, CKMBINDEX, TROPONINI in the last 168 hours. BNP: Invalid input(s): POCBNP CBG: Recent Labs  Lab 08/15/17 0805 08/15/17 1111 08/15/17 1856 08/15/17 2132 08/16/17 0750  GLUCAP 81 103* 143* 89 56*   D-Dimer No results for input(s): DDIMER in the last 72 hours. Hgb A1c No results for input(s): HGBA1C in the last 72 hours. Lipid Profile No results for input(s): CHOL, HDL, LDLCALC, TRIG, CHOLHDL, LDLDIRECT in the last 72 hours. Thyroid function studies No results for input(s): TSH, T4TOTAL, T3FREE, THYROIDAB in the last 72 hours.  Invalid input(s): FREET3 Anemia work up No results for input(s): VITAMINB12, FOLATE, FERRITIN, TIBC, IRON, RETICCTPCT in the last 72 hours. Urinalysis    Component Value Date/Time   COLORURINE AMBER (A) 08/07/2017 2124   APPEARANCEUR TURBID (A) 08/07/2017 2124   LABSPEC 1.018 08/07/2017 2124   PHURINE 5.0 08/07/2017 2124   GLUCOSEU NEGATIVE 08/07/2017 2124   HGBUR LARGE (A) 08/07/2017 2124   BILIRUBINUR NEGATIVE 08/07/2017 2124   KETONESUR 5 (A) 08/07/2017 2124   PROTEINUR >=300 (A) 08/07/2017 2124   UROBILINOGEN 0.2 03/01/2015 1913    NITRITE NEGATIVE 08/07/2017 2124   LEUKOCYTESUR LARGE (A) 08/07/2017 2124   Sepsis Labs Invalid input(s): PROCALCITONIN,  WBC,  LACTICIDVEN Microbiology Recent Results (from the past 240 hour(s))  Urine culture     Status: Abnormal   Collection Time: 08/07/17  9:29 PM  Result Value Ref Range Status   Specimen Description   Final    URINE, CATHETERIZED Performed at [facility], [place] [address] [place], [place] [address]    Special Requests   Final    NONE Performed at [facility], [place] [address], [place], [place] [address]    Culture MULTIPLE SPECIES PRESENT, SUGGEST RECOLLECTION (A)  Final   Report Status 08/09/2017 FINAL  Final  Gastrointestinal Panel by PCR , Stool     Status: None   Collection Time: 08/07/17  9:37 PM  Result Value Ref Range Status   Campylobacter species NOT DETECTED NOT DETECTED Final   Plesimonas shigelloides NOT DETECTED NOT DETECTED Final   Salmonella species NOT DETECTED NOT DETECTED Final   Yersinia enterocolitica NOT DETECTED NOT DETECTED Final   Vibrio species NOT DETECTED NOT DETECTED Final   Vibrio cholerae NOT DETECTED NOT DETECTED Final   Enteroaggregative E coli (EAEC) NOT DETECTED NOT DETECTED Final   Enteropathogenic E coli (EPEC) NOT DETECTED NOT DETECTED Final   Enterotoxigenic E coli (ETEC) NOT DETECTED NOT DETECTED Final  Shiga like toxin producing E coli (STEC) NOT DETECTED NOT DETECTED Final   Shigella/Enteroinvasive E coli (EIEC) NOT DETECTED NOT DETECTED Final   Cryptosporidium NOT DETECTED NOT DETECTED Final   Cyclospora cayetanensis NOT DETECTED NOT DETECTED Final   Entamoeba histolytica NOT DETECTED NOT DETECTED Final   Giardia lamblia NOT DETECTED NOT DETECTED Final   Adenovirus F40/41 NOT DETECTED NOT DETECTED Final   Astrovirus NOT DETECTED NOT DETECTED Final   Norovirus GI/GII NOT DETECTED NOT DETECTED Final   Rotavirus A NOT DETECTED NOT DETECTED Final   Sapovirus (I, II, IV, and  V) NOT DETECTED NOT DETECTED Final    Comment: Performed at [facility], [place] [place], [place] [address]  Blood Culture (routine x 2)     Status: Abnormal   Collection Time: 08/07/17  9:57 PM  Result Value Ref Range Status   Specimen Description   Final    BLOOD RIGHT HAND Performed at [facility], [place] [address] [place], [place] [address]    Special Requests   Final    BOTTLES DRAWN AEROBIC AND ANAEROBIC Blood Culture adequate volume   Culture  Setup Time   Final    GRAM NEGATIVE RODS IN BOTH AEROBIC AND ANAEROBIC BOTTLES CRITICAL RESULT CALLED TO, READ BACK BY AND VERIFIED WITH: Christean Grief Pharm.D. 13:45 08/08/17 ([email or web address]) Performed at [facility], [place] [address], [place], [place] [address]    Culture PROTEUS MIRABILIS (A)  Final   Report Status 08/10/2017 FINAL  Final   Organism ID, Bacteria PROTEUS MIRABILIS  Final      Susceptibility   Proteus mirabilis - MIC*    AMPICILLIN <=2 SENSITIVE Sensitive     CEFAZOLIN <=4 SENSITIVE Sensitive     CEFEPIME <=1 SENSITIVE Sensitive     CEFTAZIDIME <=1 SENSITIVE Sensitive     CEFTRIAXONE <=1 SENSITIVE Sensitive     CIPROFLOXACIN 2 INTERMEDIATE Intermediate     GENTAMICIN <=1 SENSITIVE Sensitive     IMIPENEM 2 SENSITIVE Sensitive     TRIMETH/SULFA <=20 SENSITIVE Sensitive     AMPICILLIN/SULBACTAM <=2 SENSITIVE Sensitive     PIP/TAZO <=4 SENSITIVE Sensitive     * PROTEUS MIRABILIS  Blood Culture ID Panel (Reflexed)     Status: Abnormal   Collection Time: 08/07/17  9:57 PM  Result Value Ref Range Status   Enterococcus species NOT DETECTED NOT DETECTED Final   Listeria monocytogenes NOT DETECTED NOT DETECTED Final   Staphylococcus species NOT DETECTED NOT DETECTED Final   Staphylococcus aureus NOT DETECTED NOT DETECTED Final   Streptococcus species NOT DETECTED NOT DETECTED Final   Streptococcus agalactiae NOT DETECTED NOT DETECTED Final   Streptococcus pneumoniae NOT DETECTED NOT  DETECTED Final   Streptococcus pyogenes NOT DETECTED NOT DETECTED Final   Acinetobacter baumannii NOT DETECTED NOT DETECTED Final   Enterobacteriaceae species DETECTED (A) NOT DETECTED Final    Comment: Enterobacteriaceae represent a large family of gram-negative bacteria, not a single organism. CRITICAL RESULT CALLED TO, READ BACK BY AND VERIFIED WITH: Christean Grief Pharm.D. 13:45 08/08/17 ([email or web address])    Enterobacter cloacae complex NOT DETECTED NOT DETECTED Final   Escherichia coli NOT DETECTED NOT DETECTED Final   Klebsiella oxytoca NOT DETECTED NOT DETECTED Final   Klebsiella pneumoniae NOT DETECTED NOT DETECTED Final   Proteus species DETECTED (A) NOT DETECTED Final    Comment: CRITICAL RESULT CALLED TO, READ BACK BY AND VERIFIED WITH: Mila Merry.D. 13:45 08/08/17 ([email or web address])    Serratia marcescens NOT DETECTED  NOT DETECTED Final   Carbapenem resistance NOT DETECTED NOT DETECTED Final   Haemophilus influenzae NOT DETECTED NOT DETECTED Final   Neisseria meningitidis NOT DETECTED NOT DETECTED Final   Pseudomonas aeruginosa NOT DETECTED NOT DETECTED Final   Candida albicans NOT DETECTED NOT DETECTED Final   Candida glabrata NOT DETECTED NOT DETECTED Final   Candida krusei NOT DETECTED NOT DETECTED Final   Candida parapsilosis NOT DETECTED NOT DETECTED Final   Candida tropicalis NOT DETECTED NOT DETECTED Final  Blood Culture (routine x 2)     Status: None   Collection Time: 08/07/17 10:48 PM  Result Value Ref Range Status   Specimen Description   Final    BLOOD BLOOD RIGHT FOREARM Performed at [facility] [address], [place], [place] [address]    Special Requests   Final    BOTTLES DRAWN AEROBIC AND ANAEROBIC Blood Culture results may not be optimal due to an inadequate volume of blood received in culture bottles   Culture   Final    NO GROWTH 5 DAYS Performed at [facility], [place] [address], [place], [place] [address]    Report Status 08/13/2017 FINAL   Final  MRSA PCR Screening     Status: Abnormal   Collection Time: 08/08/17 12:40 AM  Result Value Ref Range Status   MRSA by PCR POSITIVE (A) NEGATIVE Final    Comment:        The GeneXpert MRSA Assay (FDA approved for NASAL specimens only), is one component of a comprehensive MRSA colonization surveillance program. It is not intended to diagnose MRSA infection nor to guide or monitor treatment for MRSA infections. RESULT CALLED TO, READ BACK BY AND VERIFIED WITH: LACIVITA,H RN 2.20.19 @346  ZANDO,C Performed at [facility], [place] [address] [place], [place] [address]   C difficile quick scan w PCR reflex     Status: Abnormal   Collection Time: 08/10/17  9:00 PM  Result Value Ref Range Status   C Diff antigen POSITIVE (A) NEGATIVE Final   C Diff toxin NEGATIVE NEGATIVE Final   C Diff interpretation Results are indeterminate. See PCR results.  Final    Comment: Performed at [facility], [place] [address], [place], [place] [address]  C. Diff by PCR, Reflexed     Status: None   Collection Time: 08/10/17  9:00 PM  Result Value Ref Range Status   Toxigenic C. Difficile by PCR NEGATIVE NEGATIVE Final    Comment: Patient is colonized with non toxigenic C. difficile. May not need treatment unless significant symptoms are present. Performed at [facility], [place] [address], [place], [place] [address]      Time coordinating discharge: 45 minutes  SIGNED:   Tawni Millers, MD  [facility] 08/16/2017, 8:40 AM Pager [phone]  If 7PM-7AM, please contact night-coverage [email or web address] Password [email or web address]

## 2017-08-16 NOTE — Consult Note (Addendum)
[facility] Nurse wound follow up Wound type: DTI left medial heel, healing stage IV sacral wound Measurement:heel 2cm x 2cm deep maroon color, does not blanch. Foam and Prevalon boots in place Sacral wound 2.5cm x 2.5cm x 0.1cm center area of a larger healing stage IV ulcer. Scar tissue surrounds this area. Open center area is 50% pink, 50% light yellow soft slough, no drainage Wound bed: see above Drainage (amount, consistency, odor) see above Periwound:see above Dressing procedure/placement/frequency: I have provided nurses with orders for NS moist to dry dressing changes BID. Orders to not use large sacral foam dressing as patient has fecal pouch for stool collection and it is leaking into the larger dressing due to the poiinted shape of the dressing. Removed, cleansed, apply NS moist gauze, dry gauze, covered with small foam dressing that can be peeled back for dressing changes BID and foam can be changed Q3D and prn soiling. Patient is on a low air loss mattress, prevalon boots in place, patient positioned off of back. Patient also using external urinary containment system. Patient also on nutritional supplements to assist in wound healing. We will follow this patient and remain available to this patient, nursing, and the medical and surgical teams.  Text paged MD about right arm being edematous and painful. Pulse present, patient poor historian on when it started or possible causes. Pt followed by ID and GI, text paged hospitalist to make sure they are aware of right arm edema since pt has history of blood clots.  Fara Olden, RN-C, WTA-C, [facility]

## 2017-08-16 NOTE — Care Management Important Message (Signed)
Important Message  Patient Details IM Letter given to Nora/Case Manager to present to the Patient Name: BENNETTA RUDDEN MRN: [id number] Date of Birth: 19-Jan-1956   Medicare Important Message Given:  Yes    Kerin Salen 08/16/2017, 11:49 AM

## 2017-08-17 DIAGNOSIS — A0472 Enterocolitis due to Clostridium difficile, not specified as recurrent: Secondary | ICD-10-CM

## 2017-08-17 LAB — CBC WITH DIFFERENTIAL/PLATELET
BASOS PCT: 0 %
Basophils Absolute: 0 10*3/uL (ref 0.0–0.1)
EOS ABS: 0.2 10*3/uL (ref 0.0–0.7)
Eosinophils Relative: 1 %
HCT: 27.5 % — ABNORMAL LOW (ref 36.0–46.0)
Hemoglobin: 8.9 g/dL — ABNORMAL LOW (ref 12.0–15.0)
LYMPHS ABS: 2.1 10*3/uL (ref 0.7–4.0)
LYMPHS PCT: 12 %
MCH: 25.2 pg — AB (ref 26.0–34.0)
MCHC: 32.4 g/dL (ref 30.0–36.0)
MCV: 77.9 fL — AB (ref 78.0–100.0)
MONO ABS: 0.5 10*3/uL (ref 0.1–1.0)
Monocytes Relative: 3 %
NEUTROS ABS: 15 10*3/uL — AB (ref 1.7–7.7)
Neutrophils Relative %: 84 %
PLATELETS: 371 10*3/uL (ref 150–400)
RBC: 3.53 MIL/uL — ABNORMAL LOW (ref 3.87–5.11)
RDW: 20.3 % — ABNORMAL HIGH (ref 11.5–15.5)
WBC: 17.8 10*3/uL — ABNORMAL HIGH (ref 4.0–10.5)

## 2017-08-17 LAB — BASIC METABOLIC PANEL
Anion gap: 9 (ref 5–15)
CO2: 20 mmol/L — ABNORMAL LOW (ref 22–32)
Calcium: 7.9 mg/dL — ABNORMAL LOW (ref 8.9–10.3)
Chloride: 107 mmol/L (ref 101–111)
Creatinine, Ser: 0.39 mg/dL — ABNORMAL LOW (ref 0.44–1.00)
GFR calc Af Amer: >60 mL/min (ref >60)
GLUCOSE: 102 mg/dL — AB (ref 65–99)
POTASSIUM: 3.8 mmol/L (ref 3.5–5.1)
Sodium: 136 mmol/L (ref 135–145)

## 2017-08-17 LAB — GLUCOSE, CAPILLARY
GLUCOSE-CAPILLARY: 74 mg/dL (ref 65–99)
Glucose-Capillary: 96 mg/dL (ref 65–99)

## 2017-08-17 MED ORDER — VANCOMYCIN 50 MG/ML ORAL SOLUTION: 125.0000 mg | Freq: Four times a day (QID) | ORAL | 0 refills | Status: AC

## 2017-08-17 NOTE — Progress Notes (Signed)
PROGRESS NOTE    POETRY CERRO  ONG:[id number] DOB: 03/21/1956 DOA: 08/07/2017 PCP: Dixie Dials, MD    Brief Narrative:  62 year old female who presented with shock. She does have a significant past medical history for multiple sclerosis, bedridden, type 2 diabetes mellitus, hypertension and pulmonary embolism. He had 3-day history of nausea, vomiting and diarrhea, on the initial physical examination she was febrile, tachycardic and hypotensive. She was awake and alert, dry mucous membranes, heart S1-S2 present, tachycardic, lungs with no wheezing, rales or rhonchi, abdomen was soft nontender, lower extremity with trace edema.Sodium 128, potassium 4.0, chloride 97, bicarb 14, glucose 345, BUN 46, creatinine 1.95, calcium 7.6, white count 23.6, hemoglobin 7.6, hematocrit 23.1,platelets 265.Urinalysis with too numerous to count white cells, too numerous counts RBCs, specific gravity 1.018, protein greater than 300.C. difficile antigen positive.Chest x-ray with bibasilar atelectasis more left than right, film hypoinflated withleft-sided rotation.EKG sinus tachycardia, left axis deviation, normal intervals.  Patient was admitted to the hospital with the working diagnosis of septic shock related to urinary tract infection.   Assessment & Plan:   Principal Problem:   Fever Active Problems:   Multiple sclerosis (HCC)   Diabetes mellitus with peripheral vascular disease (HCC)   Leukocytosis   Anemia of chronic disease   Decubitus ulcer of sacral region, stage 3 (HCC)   Essential hypertension, benign   PVD (peripheral vascular disease) (HCC)   Hx of pulmonary embolus   Proteus mirabilis infection   Diarrhea   Bacteremia   1.  Septic shock due to urinary tract infection in the setting of nephrolithiasis.   Patient completed antibiotic therapy, currently asymptomatic, patient was seen by urology, recommendations for outpatient follow-up.  Renal ultrasonography with no  hydronephrosis.  2.  Sacral decubitus ulcer present on admission. Quarter size stage 2, continue frequent turning, local wound care.  3.  Diarrhea suspected C. Difficile.  Present on admission.  Patient had persistent diarrhea, watery, associated with leukocytosis.  C. difficile antigen positive, patient has been placed on oral vancomycin with improvement of diarrhea and leukocytosis, will plan to complete 14-day therapy.  4.  Multiple sclerosis. dvt prophylaxis.  Home health services.  5.  Type 2 diabetes mellitus. Glucose cover and monitoring with insulin sliding scale. Basal insulin with glargine, 20 units, capillary glucose. Capillary glucose well controlled.   6. HTN. Coreg for blood pressure control.   7. Pulmonary embolism. Anticoagulation with apixaban with good toleration. .  Patient will be discharged home today to continue home health services, oral vancomycin.  Please see discharge summary.  DVT prophylaxis: scd  Code Status:  dnr Family Communication: no family at the beside Disposition Plan: home   Consultants:     Procedures:     Antimicrobials:       Subjective: She is feeling well, no nausea, no vomiting, diarrhea has improved.  Objective: Vitals:   08/16/17 0504 08/16/17 1713 08/16/17 2018 08/17/17 0530  BP: 134/84 140/75 139/72 (!) 148/80  Pulse: 85 88 82 85  Resp: 12 16 16 20   Temp: 98.4 F (36.9 C) 99.2 F (37.3 C) 99.4 F (37.4 C) 98.6 F (37 C)  TempSrc: Oral Oral Oral Oral  SpO2: 100% 90% 99% 100%  Weight: 80.3 kg (177 lb)     Height:        Intake/Output Summary (Last 24 hours) at 08/17/2017 1235 Last data filed at 08/17/2017 0900 Gross per 24 hour  Intake 550 ml  Output 2100 ml  Net -1550 ml   Filed  Weights   08/14/17 0600 08/15/17 0500 08/16/17 0504  Weight: 82.7 kg (182 lb 5.1 oz) 82.1 kg (180 lb 16 oz) 80.3 kg (177 lb)    Examination:   General: Not in pain or dyspnea, deconditioned Neurology: Awake and alert,  non focal  E ENT: mild pallor, no icterus, oral mucosa moist Cardiovascular: No JVD. S1-S2 present, rhythmic, no gallops, rubs, or murmurs. No lower extremity edema. Pulmonary: decreased breath sounds bilaterally, adequate air movement, no wheezing, rhonchi or rales. Gastrointestinal. Abdomen protubarent, no organomegaly, non tender, no rebound or guarding Skin. No rashes Musculoskeletal: no joint deformities     Data Reviewed: I have personally reviewed following labs and imaging studies  CBC: Recent Labs  Lab 08/11/17 0545 08/12/17 0545 08/13/17 0506 08/16/17 1145 08/17/17 1120  WBC 20.6* 22.1* 22.3* 21.1* 17.8*  NEUTROABS  --   --   --   --  15.0*  HGB 7.4* 7.0* 8.9* 8.5* 8.9*  HCT 22.0* 20.2* 25.9* 24.8* 27.5*  MCV 71.0* 71.1* 74.2* 77.5* 77.9*  PLT 69* 88* 99* 249 161   Basic Metabolic Panel: Recent Labs  Lab 08/11/17 0545 08/13/17 0506 08/16/17 1145 08/17/17 1120  NA 134* 138 134* 136  K 4.4 4.6 4.4 3.8  CL 111 114* 110 107  CO2 16* 17* 18* 20*  GLUCOSE 239* 78 124* 102*  BUN 18 12 <5* <5*  CREATININE 0.62 0.60 0.42* 0.39*  CALCIUM 7.5* 7.9* 7.5* 7.9*  MG  --  1.2*  --   --    GFR: Estimated Creatinine Clearance: 73.2 mL/min (A) (by C-G formula based on SCr of 0.39 mg/dL (L)). Liver Function Tests: No results for input(s): AST, ALT, ALKPHOS, BILITOT, PROT, ALBUMIN in the last 168 hours. No results for input(s): LIPASE, AMYLASE in the last 168 hours. No results for input(s): AMMONIA in the last 168 hours. Coagulation Profile: No results for input(s): INR, PROTIME in the last 168 hours. Cardiac Enzymes: No results for input(s): CKTOTAL, CKMB, CKMBINDEX, TROPONINI in the last 168 hours. BNP (last 3 results) No results for input(s): PROBNP in the last 8760 hours. HbA1C: No results for input(s): HGBA1C in the last 72 hours. CBG: Recent Labs  Lab 08/16/17 0844 08/16/17 1208 08/16/17 1758 08/16/17 2134 08/17/17 0845  GLUCAP 87 113* 100* 118* 96    Lipid Profile: No results for input(s): CHOL, HDL, LDLCALC, TRIG, CHOLHDL, LDLDIRECT in the last 72 hours. Thyroid Function Tests: No results for input(s): TSH, T4TOTAL, FREET4, T3FREE, THYROIDAB in the last 72 hours. Anemia Panel: No results for input(s): VITAMINB12, FOLATE, FERRITIN, TIBC, IRON, RETICCTPCT in the last 72 hours.    Radiology Studies: I have reviewed all of the imaging during this hospital visit personally     Scheduled Meds: . apixaban  2.5 mg Oral BID  . carvedilol  3.125 mg Oral BID WC  . famotidine  20 mg Oral BID  . feeding supplement  1 Container Oral TID BM  . insulin aspart  0-20 Units Subcutaneous TID WC  . insulin aspart  4 Units Subcutaneous TID WC  . insulin glargine  5 Units Subcutaneous Daily  . multivitamin with minerals  1 tablet Oral Daily  . nystatin   Topical BID  . vancomycin  125 mg Oral QID   Continuous Infusions: . sodium chloride       LOS: 9 days        Mauricio Gerome Apley, MD [facility] Pager [phone]

## 2017-08-17 NOTE — Care Management Note (Signed)
Case Management Note  Patient Details  Name: IZABEL CHIM MRN: [id number] Date of Birth: 1956-03-30  Subjective/Objective:  [employer] rep Adacia-aware of d/c & HHC orders. [facility] rep Santiago Glad has already provided/discussed alternating pressure pad for patient with patient. PTAR non emergency albulance transp forms in shadow chart for nsg to call when ready.Partial code form in shadow chart also. No further CM needs.                  Action/Plan:d/c home w/HHC/dme/PTAR   Expected Discharge Date:  08/17/17               Expected Discharge Plan:  [facility]  In-House Referral:     Discharge planning Services  CM Consult  Post Acute Care Choice:  Durable Medical Equipment, Home Health(hospital bed. [employer] HHRN/PT/OT) Choice offered to:     DME Arranged:    DME Agency:     HH Arranged:    HH Agency:     Status of Service:  Completed, signed off  If discussed at [employer], dates discussed:    Additional Comments:  [name], RN 08/17/2017, 12:40 PM

## 2019-02-10 ENCOUNTER — Encounter (HOSPITAL_COMMUNITY): Payer: Self-pay | Admitting: Emergency Medicine

## 2019-02-10 ENCOUNTER — Emergency Department (HOSPITAL_COMMUNITY): Payer: Medicare Other

## 2019-02-10 ENCOUNTER — Inpatient Hospital Stay (HOSPITAL_COMMUNITY)
Admission: EM | Admit: 2019-02-10 | Discharge: 2019-02-15 | DRG: 377 | Disposition: A | Discharge status: Home / Self Care | Payer: Medicare Other | Attending: Family Medicine | Admitting: Family Medicine

## 2019-02-10 DIAGNOSIS — I5042 Chronic combined systolic (congestive) and diastolic (congestive) heart failure: Secondary | ICD-10-CM | POA: Diagnosis present

## 2019-02-10 DIAGNOSIS — K922 Gastrointestinal hemorrhage, unspecified: Secondary | ICD-10-CM | POA: Diagnosis not present

## 2019-02-10 DIAGNOSIS — Z87891 Personal history of nicotine dependence: Secondary | ICD-10-CM | POA: Diagnosis not present

## 2019-02-10 DIAGNOSIS — I110 Hypertensive heart disease with heart failure: Secondary | ICD-10-CM | POA: Diagnosis present

## 2019-02-10 DIAGNOSIS — Z7901 Long term (current) use of anticoagulants: Secondary | ICD-10-CM

## 2019-02-10 DIAGNOSIS — E1151 Type 2 diabetes mellitus with diabetic peripheral angiopathy without gangrene: Secondary | ICD-10-CM | POA: Diagnosis present

## 2019-02-10 DIAGNOSIS — Z882 Allergy status to sulfonamides status: Secondary | ICD-10-CM | POA: Diagnosis not present

## 2019-02-10 DIAGNOSIS — Z8619 Personal history of other infectious and parasitic diseases: Secondary | ICD-10-CM

## 2019-02-10 DIAGNOSIS — D649 Anemia, unspecified: Secondary | ICD-10-CM

## 2019-02-10 DIAGNOSIS — Z20828 Contact with and (suspected) exposure to other viral communicable diseases: Secondary | ICD-10-CM | POA: Diagnosis present

## 2019-02-10 DIAGNOSIS — E872 Acidosis: Secondary | ICD-10-CM | POA: Diagnosis present

## 2019-02-10 DIAGNOSIS — Z79891 Long term (current) use of opiate analgesic: Secondary | ICD-10-CM

## 2019-02-10 DIAGNOSIS — G35 Multiple sclerosis: Secondary | ICD-10-CM | POA: Diagnosis present

## 2019-02-10 DIAGNOSIS — D509 Iron deficiency anemia, unspecified: Secondary | ICD-10-CM | POA: Diagnosis present

## 2019-02-10 DIAGNOSIS — Z820 Family history of epilepsy and other diseases of the nervous system: Secondary | ICD-10-CM

## 2019-02-10 DIAGNOSIS — Z86711 Personal history of pulmonary embolism: Secondary | ICD-10-CM

## 2019-02-10 DIAGNOSIS — E875 Hyperkalemia: Secondary | ICD-10-CM | POA: Diagnosis not present

## 2019-02-10 DIAGNOSIS — K449 Diaphragmatic hernia without obstruction or gangrene: Secondary | ICD-10-CM | POA: Diagnosis present

## 2019-02-10 DIAGNOSIS — K920 Hematemesis: Secondary | ICD-10-CM | POA: Diagnosis present

## 2019-02-10 DIAGNOSIS — K921 Melena: Secondary | ICD-10-CM | POA: Diagnosis present

## 2019-02-10 DIAGNOSIS — Z6825 Body mass index (BMI) 25.0-25.9, adult: Secondary | ICD-10-CM | POA: Diagnosis not present

## 2019-02-10 DIAGNOSIS — K222 Esophageal obstruction: Secondary | ICD-10-CM | POA: Diagnosis present

## 2019-02-10 DIAGNOSIS — K5521 Angiodysplasia of colon with hemorrhage: Principal | ICD-10-CM | POA: Diagnosis present

## 2019-02-10 DIAGNOSIS — G4733 Obstructive sleep apnea (adult) (pediatric): Secondary | ICD-10-CM | POA: Diagnosis present

## 2019-02-10 DIAGNOSIS — I9589 Other hypotension: Secondary | ICD-10-CM

## 2019-02-10 DIAGNOSIS — N179 Acute kidney failure, unspecified: Secondary | ICD-10-CM | POA: Diagnosis present

## 2019-02-10 DIAGNOSIS — Z79899 Other long term (current) drug therapy: Secondary | ICD-10-CM

## 2019-02-10 DIAGNOSIS — R571 Hypovolemic shock: Secondary | ICD-10-CM | POA: Diagnosis present

## 2019-02-10 DIAGNOSIS — L89153 Pressure ulcer of sacral region, stage 3: Secondary | ICD-10-CM | POA: Diagnosis present

## 2019-02-10 DIAGNOSIS — I82409 Acute embolism and thrombosis of unspecified deep veins of unspecified lower extremity: Secondary | ICD-10-CM | POA: Diagnosis not present

## 2019-02-10 DIAGNOSIS — Z7984 Long term (current) use of oral hypoglycemic drugs: Secondary | ICD-10-CM

## 2019-02-10 LAB — URINALYSIS, ROUTINE W REFLEX MICROSCOPIC
Bilirubin Urine: NEGATIVE
Glucose, UA: NEGATIVE mg/dL
Ketones, ur: NEGATIVE mg/dL
Nitrite: NEGATIVE
Protein, ur: 100 mg/dL — AB
RBC / HPF: >50 RBC/hpf — ABNORMAL HIGH (ref 0–5)
Specific Gravity, Urine: 1.011 (ref 1.005–1.030)
WBC, UA: >50 WBC/hpf — ABNORMAL HIGH (ref 0–5)
pH: 6 (ref 5.0–8.0)

## 2019-02-10 LAB — PROTIME-INR
INR: 2 — ABNORMAL HIGH (ref 0.8–1.2)
Prothrombin Time: 22.4 seconds — ABNORMAL HIGH (ref 11.4–15.2)

## 2019-02-10 LAB — CBC WITH DIFFERENTIAL/PLATELET
Abs Immature Granulocytes: 0.16 10*3/uL — ABNORMAL HIGH (ref 0.00–0.07)
Basophils Absolute: 0 10*3/uL (ref 0.0–0.1)
Basophils Relative: 0 %
Eosinophils Absolute: 0.1 10*3/uL (ref 0.0–0.5)
Eosinophils Relative: 1 %
HCT: 10.6 % — ABNORMAL LOW (ref 36.0–46.0)
Hemoglobin: 2.7 g/dL — CL (ref 12.0–15.0)
Immature Granulocytes: 1 %
Lymphocytes Relative: 8 %
Lymphs Abs: 1.3 10*3/uL (ref 0.7–4.0)
MCH: 18.9 pg — ABNORMAL LOW (ref 26.0–34.0)
MCHC: 25.5 g/dL — ABNORMAL LOW (ref 30.0–36.0)
MCV: 74.1 fL — ABNORMAL LOW (ref 80.0–100.0)
Monocytes Absolute: 1 10*3/uL (ref 0.1–1.0)
Monocytes Relative: 6 %
Neutro Abs: 13.8 10*3/uL — ABNORMAL HIGH (ref 1.7–7.7)
Neutrophils Relative %: 84 %
Platelets: 771 10*3/uL — ABNORMAL HIGH (ref 150–400)
RBC: 1.43 MIL/uL — ABNORMAL LOW (ref 3.87–5.11)
RDW: 21.3 % — ABNORMAL HIGH (ref 11.5–15.5)
WBC: 16.4 10*3/uL — ABNORMAL HIGH (ref 4.0–10.5)
nRBC: 1.6 % — ABNORMAL HIGH (ref 0.0–0.2)

## 2019-02-10 LAB — COMPREHENSIVE METABOLIC PANEL
ALT: 7 U/L (ref 0–44)
AST: 8 U/L — ABNORMAL LOW (ref 15–41)
Albumin: 2.3 g/dL — ABNORMAL LOW (ref 3.5–5.0)
Alkaline Phosphatase: 81 U/L (ref 38–126)
Anion gap: 9 (ref 5–15)
BUN: 68 mg/dL — ABNORMAL HIGH (ref 8–23)
CO2: 12 mmol/L — ABNORMAL LOW (ref 22–32)
Calcium: 7.8 mg/dL — ABNORMAL LOW (ref 8.9–10.3)
Chloride: 110 mmol/L (ref 98–111)
Creatinine, Ser: 2.24 mg/dL — ABNORMAL HIGH (ref 0.44–1.00)
GFR calc Af Amer: 26 mL/min — ABNORMAL LOW (ref >60)
GFR calc non Af Amer: 22 mL/min — ABNORMAL LOW (ref >60)
Glucose, Bld: 135 mg/dL — ABNORMAL HIGH (ref 70–99)
Potassium: 4.3 mmol/L (ref 3.5–5.1)
Sodium: 131 mmol/L — ABNORMAL LOW (ref 135–145)
Total Bilirubin: 0.7 mg/dL (ref 0.3–1.2)
Total Protein: 6.1 g/dL — ABNORMAL LOW (ref 6.5–8.1)

## 2019-02-10 LAB — PREPARE RBC (CROSSMATCH)

## 2019-02-10 LAB — SARS CORONAVIRUS 2 BY RT PCR (HOSPITAL ORDER, PERFORMED IN ~~LOC~~ HOSPITAL LAB): SARS Coronavirus 2: NEGATIVE

## 2019-02-10 LAB — POC OCCULT BLOOD, ED: Fecal Occult Bld: POSITIVE — AB

## 2019-02-10 LAB — TROPONIN I (HIGH SENSITIVITY): Troponin I (High Sensitivity): 2 ng/L (ref <18)

## 2019-02-10 LAB — GLUCOSE, CAPILLARY
Glucose-Capillary: 100 mg/dL — ABNORMAL HIGH (ref 70–99)
Glucose-Capillary: 122 mg/dL — ABNORMAL HIGH (ref 70–99)

## 2019-02-10 LAB — LACTIC ACID, PLASMA: Lactic Acid, Venous: 1.5 mmol/L (ref 0.5–1.9)

## 2019-02-10 LAB — CBG MONITORING, ED: Glucose-Capillary: 110 mg/dL — ABNORMAL HIGH (ref 70–99)

## 2019-02-10 LAB — LIPASE, BLOOD: Lipase: 999 U/L — ABNORMAL HIGH (ref 11–51)

## 2019-02-10 MED ORDER — SODIUM CHLORIDE 0.9 % IV SOLN
80.0000 mg | Freq: Once | INTRAVENOUS | Status: AC
  Administered 2019-02-10: 80 mg via INTRAVENOUS
  Filled 2019-02-10: qty 80

## 2019-02-10 MED ORDER — INSULIN ASPART 100 UNIT/ML ~~LOC~~ SOLN
0.0000 [IU] | SUBCUTANEOUS | Status: DC
  Administered 2019-02-10 – 2019-02-11 (×4): 2 [IU] via SUBCUTANEOUS
  Administered 2019-02-12 (×2): 3 [IU] via SUBCUTANEOUS
  Administered 2019-02-12 – 2019-02-13 (×2): 2 [IU] via SUBCUTANEOUS
  Administered 2019-02-13: 16:00:00 3 [IU] via SUBCUTANEOUS
  Administered 2019-02-13: 2 [IU] via SUBCUTANEOUS
  Administered 2019-02-14 (×2): 3 [IU] via SUBCUTANEOUS
  Administered 2019-02-14 (×2): 2 [IU] via SUBCUTANEOUS
  Administered 2019-02-14 – 2019-02-15 (×2): 3 [IU] via SUBCUTANEOUS
  Administered 2019-02-15: 2 [IU] via SUBCUTANEOUS
  Filled 2019-02-10: qty 0.15

## 2019-02-10 MED ORDER — PIPERACILLIN-TAZOBACTAM 3.375 G IVPB
3.3750 g | Freq: Three times a day (TID) | INTRAVENOUS | Status: AC
  Administered 2019-02-11 (×2): 3.375 g via INTRAVENOUS
  Filled 2019-02-10 (×2): qty 50

## 2019-02-10 MED ORDER — SODIUM CHLORIDE 0.9 % IV SOLN
10.0000 mL/h | Freq: Once | INTRAVENOUS | Status: AC
  Administered 2019-02-10: 10 mL/h via INTRAVENOUS

## 2019-02-10 MED ORDER — SODIUM CHLORIDE 0.9 % IV BOLUS
1000.0000 mL | Freq: Once | INTRAVENOUS | Status: AC
  Administered 2019-02-10: 1000 mL via INTRAVENOUS

## 2019-02-10 MED ORDER — VANCOMYCIN HCL 10 G IV SOLR
2000.0000 mg | Freq: Once | INTRAVENOUS | Status: AC
  Administered 2019-02-10: 2000 mg via INTRAVENOUS
  Filled 2019-02-10: qty 2000

## 2019-02-10 MED ORDER — PANTOPRAZOLE SODIUM 40 MG IV SOLR: 40.0000 mg | Freq: Two times a day (BID) | INTRAVENOUS | Status: DC

## 2019-02-10 MED ORDER — SODIUM CHLORIDE 0.9 % IV SOLN
8.0000 mg/h | INTRAVENOUS | Status: DC
  Administered 2019-02-10 – 2019-02-12 (×4): 8 mg/h via INTRAVENOUS
  Filled 2019-02-10 (×6): qty 80

## 2019-02-10 MED ORDER — CHLORHEXIDINE GLUCONATE CLOTH 2 % EX PADS
6.0000 | MEDICATED_PAD | Freq: Every day | CUTANEOUS | Status: DC
  Administered 2019-02-10 – 2019-02-14 (×4): 6 via TOPICAL

## 2019-02-10 MED ORDER — PIPERACILLIN-TAZOBACTAM 3.375 G IVPB 30 MIN
3.3750 g | Freq: Once | INTRAVENOUS | Status: AC
  Administered 2019-02-10: 3.375 g via INTRAVENOUS
  Filled 2019-02-10: qty 50

## 2019-02-10 MED ORDER — HYDROCODONE-ACETAMINOPHEN 5-325 MG PO TABS
1.0000 | ORAL_TABLET | ORAL | Status: DC | PRN
  Administered 2019-02-10 – 2019-02-15 (×5): 1 via ORAL
  Filled 2019-02-10 (×5): qty 1

## 2019-02-10 NOTE — Plan of Care (Signed)
  Problem: Education: Goal: Knowledge of General Education information will improve Description Including pain rating scale, medication(s)/side effects and non-pharmacologic comfort measures Outcome: Progressing   

## 2019-02-10 NOTE — ED Notes (Signed)
Provided a warm blanket and repositioned on stretcher.

## 2019-02-10 NOTE — ED Notes (Signed)
PT UNABLE TO SIGN BLOOD CONSENT DUE TO CONTRACTURED STATE OF ARMS. PT VERBALLY CONSENTED TO BLOOD CONSENT IN FRONT OF RN MANDI LEWIS AND ABBIE Townsend AND DR. Francia Greaves. 2 RNs SIGNED CONSENT.

## 2019-02-10 NOTE — ED Notes (Signed)
RN at bedside attempting US IV.

## 2019-02-10 NOTE — Progress Notes (Signed)
A consult was received from an ED physician for Vancomycin per pharmacy dosing.  The patient's profile has been reviewed for ht/wt/allergies/indication/available labs.   A one time order has been placed for Vancomycin 2gm IV.  Further antibiotics/pharmacy consults should be ordered by admitting physician if indicated.                       Thank you, Biagio Borg 02/10/2019  1:42 PM

## 2019-02-10 NOTE — ED Triage Notes (Signed)
Pt is from home with possible blood in her vomit and dark stools. Hypotensive 80/50. Hx of MS. Alert and oriented. Slow to respond.

## 2019-02-10 NOTE — H&P (Addendum)
NAME:  Jocelyn Sanchez, MRN:  [id number], DOB:  [birth year], LOS: 0 ADMISSION DATE:  02/10/2019, CONSULTATION DATE:  02/10/2019 REFERRING MD:  Ed physician, CHIEF COMPLAINT:  Gi bleed   Brief History   Patient with a history of multiple sclerosis  Brought in by sister following noting that she was not feeling very well More sleepy than usual, not able to keep food down, black tarry stools Background history of diabetes, hypertension, peripheral vascular disease gastric and duodenal angiodysplasia She is on anticoagulation for a previous PE  Treated in the hospital February 2019 for sepsis Previous upper endoscopy January 2018 revealed mild esophagitis with stricture, 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  History of present illness   Progressive symptoms over 3 to 4 days led to presentation to the hospital Patient herself states she feels fine Has not had any fevers or chills Sister noted melena No blood noted in emesis Multiple bouts of emesis Has not been able to keep food down  Past Medical History   Past Medical History:  Diagnosis Date  . Anemia   . DM II (diabetes mellitus, type II), controlled ([place])   . Gait disorder   . HTN (hypertension)   . Lymphedema   . MS (multiple sclerosis) ([place])   . Pulmonary embolism ([place])    Significant Hospital Events   Mental status improving  Consults:  GI  Procedures:  None  Significant Diagnostic Tests:    Micro Data:  Blood culture [id number]  Antimicrobials:  Vancomycin 02/10/19>> Zosyn 02/10/2019>>  Interim history/subjective:  She feels better Denies any pain or discomfort  Objective   Blood pressure (!) 87/54, pulse 90, temperature (!) 97.4 F (36.3 C), temperature source Oral, resp. rate 15, SpO2 100 %.        Intake/Output Summary (Last 24 hours) at 02/10/2019 1647 Last data filed at 02/10/2019 1511 Gross per 24 hour  Intake 1314 ml  Output -  Net 1314 ml   There were no vitals filed for this visit.   Examination: General: Middle-age lady, does not appear to be in distress HENT: Moist oral mucosa, neck is supple Lungs: Clear breath sounds anteriorly Cardiovascular: S1-S2 appreciated Abdomen: Soft, bowel sounds appreciated Extremities: No clubbing Neuro: Alert and oriented to person place GU:    Chest x-ray reviewed by myself-showing no acute infiltrates  Resolved Hospital Problem list     Assessment & Plan:  GI bleed -Keep n.p.o. -Consult GI -PPI -Hold anticoagulation  Acute kidney injury -Secondary to hypovolemic shock -Prerenal -Trend electrolytes -Avoid nephrotoxic's  Hypovolemic shock -Blood transfusion -Fluid resuscitation  Chronic systolic congestive heart failure -On Coreg 3.125 twice a day -Lasix 20 mg daily -Entresto 24-26 twice daily -With current hypotension will hold all medications that can contribute to hypotension  Previous history of pulmonary embolism -With active bleeding we will hold anticoagulation at present  Diabetes -Was on glipizide -SSI  History of hypertension -Hold antihypertensives for current hypotension  Multiple sclerosis -Chronic debility with no acute changes -On muscle relaxant Robaxin 500 every 8 -Vicodin 5/325, 2 every 4 as needed  Leukocytosis -Follow-up on cultures -De-escalate antibiotics as we get results  Currently receiving blood transfusion  Best practice:  Diet: N.p.o. Pain/Anxiety/Delirium protocol (if indicated): Not indicated VAP protocol (if indicated): Not indicated DVT prophylaxis: Hold anticoagulation at present GI prophylaxis: PPI Glucose control: SSI Mobility: Bedridden Code Status: Full code Family Communication: Discussed with sister at bedside Disposition: ICU  Labs   CBC: Recent Labs  Lab 02/10/19 1449  WBC 16.4*  NEUTROABS PENDING  HGB 2.7*  HCT 10.6*  MCV 74.1*  PLT 771*    Basic Metabolic Panel: No results for input(s): NA, K, CL, CO2, GLUCOSE, BUN, CREATININE,  CALCIUM, MG, PHOS in the last 168 hours. GFR: CrCl cannot be calculated (Patient's most recent lab result is older than the maximum 21 days allowed.). Recent Labs  Lab 02/10/19 1324 02/10/19 1449  WBC  --  16.4*  LATICACIDVEN 1.5  --     Liver Function Tests: No results for input(s): AST, ALT, ALKPHOS, BILITOT, PROT, ALBUMIN in the last 168 hours. No results for input(s): LIPASE, AMYLASE in the last 168 hours. No results for input(s): AMMONIA in the last 168 hours.  ABG    Component Value Date/Time   HCO3 16.8 (L) 08/07/2017 2305   TCO2 18 (L) 08/07/2017 2310   ACIDBASEDEF 7.4 (H) 08/07/2017 2305   O2SAT 45.1 08/07/2017 2305     Coagulation Profile: Recent Labs  Lab 02/10/19 1324  INR 2.0*    Cardiac Enzymes: No results for input(s): CKTOTAL, CKMB, CKMBINDEX, TROPONINI in the last 168 hours.  HbA1C: Hgb A1c MFr Bld  Date/Time Value Ref Range Status  11/28/2014 11:15 AM 6.7 (H) 4.8 - 5.6 % Final    Comment:    (NOTE)         Pre-diabetes: 5.7 - 6.4         Diabetes: >6.4         Glycemic control for adults with diabetes: <7.0   12/18/2011 04:35 AM 6.0 (H) <5.7 % Final    Comment:    (NOTE)                                                                       According to the ADA Clinical Practice Recommendations for 2011, when HbA1c is used as a screening test:  >=6.5%   Diagnostic of Diabetes Mellitus           (if abnormal result is confirmed) 5.7-6.4%   Increased risk of developing Diabetes Mellitus References:Diagnosis and Classification of Diabetes Mellitus,Diabetes [id number] 1):S62-S69 and Standards of Medical Care in         Diabetes - 2011,Diabetes [id number] (Suppl 1):S11-S61.    CBG: Recent Labs  Lab 02/10/19 1346  GLUCAP 110*    Review of Systems:   Review of Systems  Constitutional: Positive for malaise/fatigue. Negative for fever.  HENT: Negative.   Eyes: Negative.   Respiratory: Negative.   Cardiovascular: Negative.    Gastrointestinal: Negative.   Genitourinary: Negative.   Musculoskeletal: Positive for myalgias.     Past Medical History  She,  has a past medical history of Anemia, DM II (diabetes mellitus, type II), controlled ([place]), Gait disorder, HTN (hypertension), Lymphedema, MS (multiple sclerosis) ([place]), and Pulmonary embolism ([place]).   Surgical History    Past Surgical History:  Procedure Laterality Date  . ESOPHAGOGASTRODUODENOSCOPY N/A 07/12/2016   Procedure: ESOPHAGOGASTRODUODENOSCOPY (EGD);  Surgeon: Irene Shipper, MD;  Location: [facility];  Service: Endoscopy;  Laterality: N/A;  . FLEXIBLE SIGMOIDOSCOPY Left 03/09/2015   Procedure: FLEXIBLE SIGMOIDOSCOPY;  Surgeon: Carol Ada, MD;  Location: [facility];  Service: Endoscopy;  Laterality: Left;     Social History   reports that  she has quit smoking. Her smoking use included cigarettes. She has never used smokeless tobacco. She reports that she does not drink alcohol or use drugs.   Family History   Her family history includes Multiple sclerosis in her mother.   Allergies Allergies  Allergen Reactions  . Sulfa Antibiotics Shortness Of Breath and Swelling     Home Medications  Prior to Admission medications   Medication Sig Start Date End Date Taking? Authorizing Provider  ALPRAZolam Duanne Moron) 0.5 MG tablet Take 0.5 mg by mouth 2 (two) times daily as needed for anxiety.    [provider]  apixaban (ELIQUIS) 2.5 MG TABS tablet Take 1 tablet (2.5 mg total) by mouth 2 (two) times daily. 07/14/16   Dixie Dials, MD  carvedilol (COREG) 3.125 MG tablet Take 3.125 mg by mouth 2 (two) times daily. 04/25/15   [provider]  famotidine (PEPCID) 20 MG tablet Take 1 tablet (20 mg total) by mouth 2 (two) times daily. 08/16/17 09/15/17  Arrien, Jimmy Picket, MD  furosemide (LASIX) 20 MG tablet Take 20 mg by mouth daily. On Monday, Wednesday and Friday    [provider]  glipiZIDE (GLUCOTROL) 5 MG tablet Take 1  tablet (5 mg total) by mouth 2 (two) times daily. Patient not taking: Reported on 08/07/2017 10/30/15   Charolette Forward, MD  HYDROcodone-acetaminophen (NORCO/VICODIN) 5-325 MG per tablet Take 2 tablets by mouth every 4 (four) hours as needed for moderate pain. 03/12/15   Robbie Lis, MD  methocarbamol (ROBAXIN) 500 MG tablet Take 0.5 tablets (250 mg total) by mouth every 8 (eight) hours as needed for muscle spasms. 03/12/15   Robbie Lis, MD  potassium chloride (K-DUR,KLOR-CON) 10 MEQ tablet Take 10 mEq by mouth daily. On Monday, Wednesday and Friday    [provider]  sacubitril-valsartan (ENTRESTO) 24-26 MG Take 1 tablet by mouth 2 (two) times daily. 10/30/15   Charolette Forward, MD    The patient is critically ill with multiple organ systems failure and requires high complexity decision making for assessment and support, frequent evaluation and titration of therapies, application of advanced monitoring technologies and extensive interpretation of multiple databases. Critical Care Time devoted to patient care services described in this note independent of APP/resident time (if applicable)  is 30 minutes.   Sherrilyn Rist MD [facility] Personal pager: [phone] If unanswered, please page CCM On-call: [phone]

## 2019-02-10 NOTE — ED Notes (Signed)
Hospitalist at bedside 

## 2019-02-10 NOTE — ED Notes (Signed)
Date and time results received: 02/10/19 4:01 PM    Test: hemoglobin  Critical Value: 2.7  Name of Provider Notified: [place]  Orders Received? Or Actions Taken?: blood admin in process

## 2019-02-10 NOTE — ED Notes (Signed)
Abby, RN spoke with pharmacy about administering Vancomycin and blood. They stated patient had Vancomycin several times before and gave permission to infuse while infusing blood.

## 2019-02-10 NOTE — ED Notes (Signed)
Provided patient a pair of socks.

## 2019-02-10 NOTE — ED Provider Notes (Addendum)
[facility] DEPT Provider Note   CSN: [id number] Arrival date & time: 02/10/19  1308     History   Chief Complaint Chief Complaint  Patient presents with  . Hematemesis  . Stool Color Change    HPI Jocelyn Sanchez is a 63 y.o. female.     63 year old female with prior medical history is detailed below presents for evaluation of possible GI bleed.  Patient is transported from home by EMS.  Patient with reported dark tarry stool.  Patient was noted to be hypotensive.  EMS reports a blood pressure of 80 systolic.  Patient is mentating and complains of feeling weak.  She confirms that she is a full code.  She is reporting that she takes Eliquis.  The history is provided by the patient, medical records and the EMS personnel.  Illness Location:  Weakness, possible GI bleed Severity:  Moderate Onset quality:  Unable to specify Timing:  Constant Progression:  Worsening Chronicity:  New Associated symptoms: no abdominal pain, no fever and no shortness of breath     Past Medical History:  Diagnosis Date  . Anemia   . DM II (diabetes mellitus, type II), controlled ([place])   . Gait disorder   . HTN (hypertension)   . Lymphedema   . MS (multiple sclerosis) ([place])   . Pulmonary embolism [facility])     Patient Active Problem List   Diagnosis Date Noted  . Proteus mirabilis infection 08/14/2017  . Diarrhea 08/14/2017  . Bacteremia 08/14/2017  . Gastric and duodenal angiodysplasia   . Esophageal stricture   . Gastroesophageal reflux disease with esophagitis   . Hx of pulmonary embolus 10/26/2015  . PVD (peripheral vascular disease) ([place]) 07/04/2015  . Essential hypertension, benign 06/17/2015  . Decubitus ulcer of ankle, stage 4 ([place]) 05/10/2015  . Fever 03/08/2015  . Diabetes mellitus with peripheral vascular disease ([place]) 03/06/2015  . Leukocytosis 03/06/2015  . Anemia of chronic disease 03/06/2015  . Decubitus ulcer of sacral region, stage 3  ([place]) 03/06/2015  . Lymphedema 03/06/2015  . Multiple sclerosis ([place]) 05/11/2011    Past Surgical History:  Procedure Laterality Date  . ESOPHAGOGASTRODUODENOSCOPY N/A 07/12/2016   Procedure: ESOPHAGOGASTRODUODENOSCOPY (EGD);  Surgeon: Irene Shipper, MD;  Location: [facility];  Service: Endoscopy;  Laterality: N/A;  . FLEXIBLE SIGMOIDOSCOPY Left 03/09/2015   Procedure: FLEXIBLE SIGMOIDOSCOPY;  Surgeon: Carol Ada, MD;  Location: [facility];  Service: Endoscopy;  Laterality: Left;     OB History    Gravida  3   Para  3   Term  0   Preterm  0   AB  0   Living  0     SAB  0   TAB  0   Ectopic  0   Multiple  0   Live Births               Home Medications    Prior to Admission medications   Medication Sig Start Date End Date Taking? Authorizing Provider  ALPRAZolam Duanne Moron) 0.5 MG tablet Take 0.5 mg by mouth 2 (two) times daily as needed for anxiety.    [provider]  apixaban (ELIQUIS) 2.5 MG TABS tablet Take 1 tablet (2.5 mg total) by mouth 2 (two) times daily. 07/14/16   Dixie Dials, MD  carvedilol (COREG) 3.125 MG tablet Take 3.125 mg by mouth 2 (two) times daily. 04/25/15   [provider]  famotidine (PEPCID) 20 MG tablet Take 1 tablet (20 mg total)  by mouth 2 (two) times daily. 08/16/17 09/15/17  Arrien, Jimmy Picket, MD  furosemide (LASIX) 20 MG tablet Take 20 mg by mouth daily. On Monday, Wednesday and Friday    [provider]  glipiZIDE (GLUCOTROL) 5 MG tablet Take 1 tablet (5 mg total) by mouth 2 (two) times daily. Patient not taking: Reported on 08/07/2017 10/30/15   Charolette Forward, MD  HYDROcodone-acetaminophen (NORCO/VICODIN) 5-325 MG per tablet Take 2 tablets by mouth every 4 (four) hours as needed for moderate pain. 03/12/15   Robbie Lis, MD  methocarbamol (ROBAXIN) 500 MG tablet Take 0.5 tablets (250 mg total) by mouth every 8 (eight) hours as needed for muscle spasms. 03/12/15   Robbie Lis, MD  potassium  chloride (K-DUR,KLOR-CON) 10 MEQ tablet Take 10 mEq by mouth daily. On Monday, Wednesday and Friday    [provider]  sacubitril-valsartan (ENTRESTO) 24-26 MG Take 1 tablet by mouth 2 (two) times daily. 10/30/15   Charolette Forward, MD    Family History Family History  Problem Relation Age of Onset  . Multiple sclerosis Mother     Social History Social History   Tobacco Use  . Smoking status: Former Smoker    Types: Cigarettes  . Smokeless tobacco: Never Used  Substance Use Topics  . Alcohol use: No  . Drug use: No     Allergies   Sulfa antibiotics   Review of Systems Review of Systems  Constitutional: Negative for fever.  Respiratory: Negative for shortness of breath.   Gastrointestinal: Negative for abdominal pain.  All other systems reviewed and are negative.    Physical Exam Updated Vital Signs BP (!) 87/49   Pulse 90   Temp (!) 97.4 F (36.3 C) (Oral)   Resp 12   SpO2 100%   Physical Exam Vitals signs and nursing note reviewed.  Constitutional:      General: She is not in acute distress.    Appearance: Normal appearance. She is well-developed.  HENT:     Head: Normocephalic and atraumatic.  Eyes:     Conjunctiva/sclera: Conjunctivae normal.     Pupils: Pupils are equal, round, and reactive to light.  Neck:     Musculoskeletal: Normal range of motion and neck supple.  Cardiovascular:     Rate and Rhythm: Normal rate and regular rhythm.     Heart sounds: Normal heart sounds.  Pulmonary:     Effort: Pulmonary effort is normal. No respiratory distress.     Breath sounds: Normal breath sounds.  Abdominal:     General: There is no distension.     Palpations: Abdomen is soft.     Tenderness: There is no abdominal tenderness.  Genitourinary:    Comments: Melanotic stool  guaiac positive Musculoskeletal: Normal range of motion.        General: No deformity.  Skin:    General: Skin is warm and dry.  Neurological:     Mental Status: She is  alert and oriented to person, place, and time.      ED Treatments / Results  Labs (all labs ordered are listed, but only abnormal results are displayed) Labs Reviewed  PROTIME-INR - Abnormal; Notable for the following components:      Result Value   Prothrombin Time 22.4 (*)    INR 2.0 (*)    All other components within normal limits  CBC WITH DIFFERENTIAL/PLATELET - Abnormal; Notable for the following components:   WBC 16.4 (*)    RBC 1.43 (*)  Hemoglobin 2.7 (*)    HCT 10.6 (*)    MCV 74.1 (*)    MCH 18.9 (*)    MCHC 25.5 (*)    RDW 21.3 (*)    Platelets 771 (*)    nRBC 1.6 (*)    All other components within normal limits  POC OCCULT BLOOD, ED - Abnormal; Notable for the following components:   Fecal Occult Bld POSITIVE (*)    All other components within normal limits  CBG MONITORING, ED - Abnormal; Notable for the following components:   Glucose-Capillary 110 (*)    All other components within normal limits  CULTURE, BLOOD (ROUTINE X 2)  CULTURE, BLOOD (ROUTINE X 2)  SARS CORONAVIRUS 2 (HOSPITAL ORDER, [place] [facility])  LACTIC ACID, PLASMA  URINALYSIS, ROUTINE W REFLEX MICROSCOPIC  CBC WITH DIFFERENTIAL/PLATELET  COMPREHENSIVE METABOLIC PANEL  LIPASE, BLOOD  TYPE AND SCREEN  PREPARE RBC (CROSSMATCH)  PREPARE RBC (CROSSMATCH)  TROPONIN I (HIGH SENSITIVITY)    EKG EKG Interpretation  Date/Time:  Monday February 10 2019 13:42:36 EDT Ventricular Rate:  83 PR Interval:    QRS Duration: 90 QT Interval:  368 QTC Calculation: 433 R Axis:   -10 Text Interpretation:  Sinus rhythm Low voltage, precordial leads Abnormal R-wave progression, early transition Nonspecific T abnormalities, lateral leads Confirmed by Dene Gentry [phone]) on 02/10/2019 1:45:00 PM   Radiology Dg Chest Port 1 View  Result Date: 02/10/2019 CLINICAL DATA:  Shortness of breath EXAM: PORTABLE CHEST 1 VIEW COMPARISON:  August 10, 2017 FINDINGS: No edema or  consolidation. Heart size and pulmonary vascularity are normal. No adenopathy. There is aortic atherosclerosis. No bone lesions. IMPRESSION: No edema or consolidation. Heart size normal. Aortic Atherosclerosis (ICD10-I70.0). Electronically Signed   By: Lowella Grip III M.D.   On: 02/10/2019 14:15    Procedures Procedures (including critical care time) CRITICAL CARE Performed by: Valarie Merino   Total critical care time: 45 minutes  Critical care time was exclusive of separately billable procedures and treating other patients.  Critical care was necessary to treat or prevent imminent or life-threatening deterioration.  Critical care was time spent personally by me on the following activities: development of treatment plan with patient and/or surrogate as well as nursing, discussions with consultants, evaluation of patient's response to treatment, examination of patient, obtaining history from patient or surrogate, ordering and performing treatments and interventions, ordering and review of laboratory studies, ordering and review of radiographic studies, pulse oximetry and re-evaluation of patient's condition.   Medications Ordered in ED Medications  vancomycin (VANCOCIN) 2,000 mg in sodium chloride 0.9 % 500 mL IVPB (has no administration in time range)  0.9 %  sodium chloride infusion (has no administration in time range)  sodium chloride 0.9 % bolus 1,000 mL (0 mLs Intravenous Stopped 02/10/19 1424)  piperacillin-tazobactam (ZOSYN) IVPB 3.375 g (0 g Intravenous Stopped 02/10/19 1502)  0.9 %  sodium chloride infusion (10 mL/hr Intravenous New Bag/Given 02/10/19 1424)  sodium chloride 0.9 % bolus 1,000 mL (0 mLs Intravenous Stopped 02/10/19 1502)     Initial Impression / Assessment and Plan / ED Course  I have reviewed the triage vital signs and the nursing notes.  Pertinent labs & imaging results that were available during my care of the patient were reviewed by me and considered  in my medical decision making (see chart for details).        MDM  Screen complete  Jocelyn Sanchez was evaluated in Emergency Department  on 02/10/2019 for the symptoms described in the history of present illness. She was evaluated in the context of the global COVID-19 pandemic, which necessitated consideration that the patient might be at risk for infection with the SARS-CoV-2 virus that causes COVID-19. Institutional protocols and algorithms that pertain to the evaluation of patients at risk for COVID-19 are in a state of rapid change based on information released by regulatory bodies including the CDC and federal and state organizations. These policies and algorithms were followed during the patient's care in the ED.  Patient is presenting for evaluation of hypotension with possible GI bleed.  Patient is noted to be hypotensive on arrival.  Melanotic stool found on initial exam.  Mentation appears intact  Patient does respond to fluids with increase in her blood pressure.  Patient given emergency release PRBCs after initial hemoglobin reported at 1.7.  Patient reports to this provider that she is a full code.  She is also reporting that she takes Eliquis.  Critical care is aware of case and evaluate for admission.  [facility] is paged regarding case.    Final Clinical Impressions(s) / ED Diagnoses   Final diagnoses:  Gastrointestinal hemorrhage, unspecified gastrointestinal hemorrhage type  Anemia, unspecified type    ED Discharge Orders    None       Valarie Merino, MD 02/10/19 1619    Valarie Merino, MD 02/10/19 [phone]

## 2019-02-11 ENCOUNTER — Other Ambulatory Visit: Payer: Self-pay

## 2019-02-11 ENCOUNTER — Inpatient Hospital Stay (HOSPITAL_COMMUNITY): Payer: Medicare Other | Admitting: Certified Registered"

## 2019-02-11 ENCOUNTER — Encounter (HOSPITAL_COMMUNITY)
Admission: EM | Disposition: A | Discharge status: Home / Self Care | Payer: Self-pay | Source: Home / Self Care | Attending: Pulmonary Disease

## 2019-02-11 ENCOUNTER — Inpatient Hospital Stay (HOSPITAL_COMMUNITY): Payer: Medicare Other

## 2019-02-11 ENCOUNTER — Encounter (HOSPITAL_COMMUNITY): Payer: Self-pay | Admitting: Certified Registered"

## 2019-02-11 DIAGNOSIS — I82409 Acute embolism and thrombosis of unspecified deep veins of unspecified lower extremity: Secondary | ICD-10-CM

## 2019-02-11 HISTORY — PX: HOT HEMOSTASIS: SHX5433

## 2019-02-11 HISTORY — PX: ESOPHAGOGASTRODUODENOSCOPY: SHX5428

## 2019-02-11 LAB — TYPE AND SCREEN
ABO/RH(D): O POS
Antibody Screen: NEGATIVE
Unit division: 0
Unit division: 0
Unit division: 0
Unit division: 0

## 2019-02-11 LAB — BASIC METABOLIC PANEL
Anion gap: 9 (ref 5–15)
BUN: 48 mg/dL — ABNORMAL HIGH (ref 8–23)
CO2: 13 mmol/L — ABNORMAL LOW (ref 22–32)
Calcium: 8 mg/dL — ABNORMAL LOW (ref 8.9–10.3)
Chloride: 113 mmol/L — ABNORMAL HIGH (ref 98–111)
Creatinine, Ser: 1.61 mg/dL — ABNORMAL HIGH (ref 0.44–1.00)
GFR calc Af Amer: 39 mL/min — ABNORMAL LOW (ref >60)
GFR calc non Af Amer: 33 mL/min — ABNORMAL LOW (ref >60)
Glucose, Bld: 100 mg/dL — ABNORMAL HIGH (ref 70–99)
Potassium: 5.2 mmol/L — ABNORMAL HIGH (ref 3.5–5.1)
Sodium: 135 mmol/L (ref 135–145)

## 2019-02-11 LAB — CBC WITH DIFFERENTIAL/PLATELET
Abs Immature Granulocytes: 0.15 10*3/uL — ABNORMAL HIGH (ref 0.00–0.07)
Basophils Absolute: 0 10*3/uL (ref 0.0–0.1)
Basophils Relative: 0 %
Eosinophils Absolute: 0.1 10*3/uL (ref 0.0–0.5)
Eosinophils Relative: 1 %
HCT: 40.9 % (ref 36.0–46.0)
Hemoglobin: 12.8 g/dL (ref 12.0–15.0)
Immature Granulocytes: 1 %
Lymphocytes Relative: 4 %
Lymphs Abs: 0.7 10*3/uL (ref 0.7–4.0)
MCH: 25 pg — ABNORMAL LOW (ref 26.0–34.0)
MCHC: 31.3 g/dL (ref 30.0–36.0)
MCV: 79.7 fL — ABNORMAL LOW (ref 80.0–100.0)
Monocytes Absolute: 0.8 10*3/uL (ref 0.1–1.0)
Monocytes Relative: 5 %
Neutro Abs: 15.1 10*3/uL — ABNORMAL HIGH (ref 1.7–7.7)
Neutrophils Relative %: 89 %
Platelets: 534 10*3/uL — ABNORMAL HIGH (ref 150–400)
RBC: 5.13 MIL/uL — ABNORMAL HIGH (ref 3.87–5.11)
RDW: 24.2 % — ABNORMAL HIGH (ref 11.5–15.5)
WBC: 17 10*3/uL — ABNORMAL HIGH (ref 4.0–10.5)
nRBC: 2.5 % — ABNORMAL HIGH (ref 0.0–0.2)

## 2019-02-11 LAB — BPAM RBC
Blood Product Expiration Date: 202009182359
Blood Product Expiration Date: 202009192359
Blood Product Expiration Date: 202009192359
Blood Product Expiration Date: 202009192359
ISSUE DATE / TIME: 202008241445
ISSUE DATE / TIME: 202008241445
ISSUE DATE / TIME: 202008241727
ISSUE DATE / TIME: 202008242137
Unit Type and Rh: 5100
Unit Type and Rh: 5100
Unit Type and Rh: 9500
Unit Type and Rh: 9500

## 2019-02-11 LAB — GLUCOSE, CAPILLARY
Glucose-Capillary: 100 mg/dL — ABNORMAL HIGH (ref 70–99)
Glucose-Capillary: 108 mg/dL — ABNORMAL HIGH (ref 70–99)
Glucose-Capillary: 111 mg/dL — ABNORMAL HIGH (ref 70–99)
Glucose-Capillary: 115 mg/dL — ABNORMAL HIGH (ref 70–99)
Glucose-Capillary: 123 mg/dL — ABNORMAL HIGH (ref 70–99)
Glucose-Capillary: 126 mg/dL — ABNORMAL HIGH (ref 70–99)
Glucose-Capillary: 128 mg/dL — ABNORMAL HIGH (ref 70–99)

## 2019-02-11 LAB — PROTIME-INR
INR: 1.7 — ABNORMAL HIGH (ref 0.8–1.2)
Prothrombin Time: 19.9 seconds — ABNORMAL HIGH (ref 11.4–15.2)

## 2019-02-11 LAB — HIV ANTIBODY (ROUTINE TESTING W REFLEX): HIV Screen 4th Generation wRfx: NONREACTIVE

## 2019-02-11 LAB — APTT: aPTT: 47 seconds — ABNORMAL HIGH (ref 24–36)

## 2019-02-11 LAB — HEMOGLOBIN A1C
Hgb A1c MFr Bld: 5.9 % — ABNORMAL HIGH (ref 4.8–5.6)
Mean Plasma Glucose: 122.63 mg/dL

## 2019-02-11 LAB — PHOSPHORUS: Phosphorus: 6 mg/dL — ABNORMAL HIGH (ref 2.5–4.6)

## 2019-02-11 SURGERY — EGD (ESOPHAGOGASTRODUODENOSCOPY)
Anesthesia: Monitor Anesthesia Care

## 2019-02-11 MED ORDER — PROPOFOL 10 MG/ML IV BOLUS
INTRAVENOUS | Status: AC
  Filled 2019-02-11: qty 20

## 2019-02-11 MED ORDER — PHENYLEPHRINE 40 MCG/ML (10ML) SYRINGE FOR IV PUSH (FOR BLOOD PRESSURE SUPPORT)
PREFILLED_SYRINGE | INTRAVENOUS | Status: DC | PRN
  Administered 2019-02-11 (×2): 80 ug via INTRAVENOUS

## 2019-02-11 MED ORDER — SODIUM CHLORIDE 0.9 % IV SOLN
INTRAVENOUS | Status: DC
  Administered 2019-02-11: 15:00:00 via INTRAVENOUS

## 2019-02-11 MED ORDER — PHENYLEPHRINE HCL-NACL 10-0.9 MG/250ML-% IV SOLN
0.0000 ug/min | INTRAVENOUS | Status: DC
  Administered 2019-02-11: 20 ug/min via INTRAVENOUS
  Administered 2019-02-11: 40 ug/min via INTRAVENOUS
  Filled 2019-02-11 (×2): qty 250

## 2019-02-11 MED ORDER — PROPOFOL 10 MG/ML IV BOLUS
INTRAVENOUS | Status: DC | PRN
  Administered 2019-02-11 (×7): 20 mg via INTRAVENOUS

## 2019-02-11 MED ORDER — FENTANYL CITRATE (PF) 100 MCG/2ML IJ SOLN
INTRAMUSCULAR | Status: AC
  Filled 2019-02-11: qty 2

## 2019-02-11 MED ORDER — LIDOCAINE 2% (20 MG/ML) 5 ML SYRINGE
INTRAMUSCULAR | Status: DC | PRN
  Administered 2019-02-11: 40 mg via INTRAVENOUS
  Administered 2019-02-11: 20 mg via INTRAVENOUS
  Administered 2019-02-11: 40 mg via INTRAVENOUS

## 2019-02-11 NOTE — Progress Notes (Signed)
NAME:  Jocelyn Sanchez, MRN:  [id number], DOB:  1956-01-23, LOS: 1 ADMISSION DATE:  02/10/2019, CONSULTATION DATE:  02/10/2019 REFERRING MD:  Ed physician, CHIEF COMPLAINT:  Gi bleed   Brief History   Patient with a history of multiple sclerosis  Brought in by sister following noting that she was not feeling very well More sleepy than usual, not able to keep food down, black tarry stools Background history of diabetes, hypertension, peripheral vascular disease gastric and duodenal angiodysplasia She is on anticoagulation for a previous PE  Treated in the hospital February 2019 for sepsis Previous upper endoscopy January 2018 revealed mild esophagitis with stricture, 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  History of present illness   Progressive symptoms over 3 to 4 days led to presentation to the hospital Patient herself states she feels fine Has not had any fevers or chills Sister noted melena No blood noted in emesis Multiple bouts of emesis Has not been able to keep food down  Past Medical History   Past Medical History:  Diagnosis Date  . Anemia   . DM II (diabetes mellitus, type II), controlled ([place])   . Gait disorder   . HTN (hypertension)   . Lymphedema   . MS (multiple sclerosis) ([place])   . Pulmonary embolism ([place])    Significant Hospital Events   Mental status improving No overnight events Still requiring pressors  Consults:  GI  Procedures:  None  Significant Diagnostic Tests:    Micro Data:  Blood culture [id number]  Antimicrobials:  Vancomycin 02/10/19>> Zosyn 02/10/2019>>  Interim history/subjective:  She feels better Denies any pain or discomfort  Objective   Blood pressure 125/62, pulse 66, temperature (!) 97.3 F (36.3 C), temperature source Axillary, resp. rate 15, height 5\' 3"  (1.6 m), weight 67.9 kg, SpO2 100 %.        Intake/Output Summary (Last 24 hours) at 02/11/2019 0930 Last data filed at 02/11/2019 [id number] Gross per 24 hour  Intake  3998.59 ml  Output 600 ml  Net 3398.59 ml   Filed Weights   02/11/19 0427  Weight: 67.9 kg    Examination: Middle-age lady, does not active in distress Moist oral mucosa Clear breath sounds bilaterally S1-S2 appreciated Abdomen is soft, bowel sounds appreciated No clubbing, no edema Alert and oriented to person and place  Chest x-ray reviewed by myself-showing no acute infiltrates  Resolved Hospital Problem list     Assessment & Plan:   GI bleed -Posttransfusion -Consult GI-consult placed to Dr. Benson Norway -PPI -Hold anticoagulation  Acute kidney injury -Secondary to hypovolemic shock -Prerenal Trend electrolytes -Avoid nephrotoxic's  Hypovolemic shock -Post blood transfusion -Fluid resuscitation -Requiring pressors at present-being weaned  Chronic systolic congestive heart failure -On Coreg 3.125 twice a day -Lasix 20 mg daily -Entresto 24-26 twice daily -Hold medications that may contribute to hypotension  Previous history of pulmonary embolism -With active bleeding we will hold off on anticoagulation at present  Diabetes-was on glipizide -SSI  Multiple sclerosis -Chronic debility with no acute changes -On muscle relaxant Robaxin 500 every 8 -On Vicodin 5/325, 2 every 4 as needed  Leukocytosis -We will continue follow-up on cultures -No signs of infection at present -Stop antibiotics   Best practice:  Diet: N.p.o. Pain/Anxiety/Delirium protocol (if indicated): Not indicated VAP protocol (if indicated): Not indicated DVT prophylaxis: Hold anticoagulation at present GI prophylaxis: PPI Glucose control: SSI Mobility: Bedridden Code Status: Full code Family Communication: Discussed with sister at bedside Disposition: ICU  Labs   CBC:  Recent Labs  Lab 02/10/19 1449 02/11/19 0048  WBC 16.4* 17.0*  NEUTROABS 13.8* 15.1*  HGB 2.7* 12.8  HCT 10.6* 40.9  MCV 74.1* 79.7*  PLT 771* 534*    Basic Metabolic Panel: Recent Labs  Lab 02/10/19  1449 02/11/19 0203  NA 131* 135  K 4.3 5.2*  CL 110 113*  CO2 12* 13*  GLUCOSE 135* 100*  BUN 68* 48*  CREATININE 2.24* 1.61*  CALCIUM 7.8* 8.0*  PHOS  --  6.0*   GFR: Estimated Creatinine Clearance: 32.7 mL/min (A) (by C-G formula based on SCr of 1.61 mg/dL (H)). Recent Labs  Lab 02/10/19 1324 02/10/19 1449 02/11/19 0048  WBC  --  16.4* 17.0*  LATICACIDVEN 1.5  --   --     Liver Function Tests: Recent Labs  Lab 02/10/19 1449  AST 8*  ALT 7  ALKPHOS 81  BILITOT 0.7  PROT 6.1*  ALBUMIN 2.3*   Recent Labs  Lab 02/10/19 1449  LIPASE 999*   No results for input(s): AMMONIA in the last 168 hours.  ABG    Component Value Date/Time   HCO3 16.8 (L) 08/07/2017 2305   TCO2 18 (L) 08/07/2017 2310   ACIDBASEDEF 7.4 (H) 08/07/2017 2305   O2SAT 45.1 08/07/2017 2305     Coagulation Profile: Recent Labs  Lab 02/10/19 1324 02/11/19 0048  INR 2.0* 1.7*    Cardiac Enzymes: No results for input(s): CKTOTAL, CKMB, CKMBINDEX, TROPONINI in the last 168 hours.  HbA1C: Hgb A1c MFr Bld  Date/Time Value Ref Range Status  02/11/2019 02:03 AM 5.9 (H) 4.8 - 5.6 % Final    Comment:    (NOTE) Pre diabetes:          5.7%-6.4% Diabetes:              >6.4% Glycemic control for   <7.0% adults with diabetes   11/28/2014 11:15 AM 6.7 (H) 4.8 - 5.6 % Final    Comment:    (NOTE)         Pre-diabetes: 5.7 - 6.4         Diabetes: >6.4         Glycemic control for adults with diabetes: <7.0     CBG: Recent Labs  Lab 02/10/19 1346 02/10/19 1946 02/10/19 2341 02/11/19 0332 02/11/19 0812  GLUCAP 110* 122* 100* 115* 128*    Review of Systems:   Review of Systems  Constitutional: Positive for malaise/fatigue. Negative for fever.  HENT: Negative.   Eyes: Negative.   Respiratory: Negative.   Cardiovascular: Negative.   Gastrointestinal: Negative.   Genitourinary: Negative.   Musculoskeletal: Positive for myalgias.     Past Medical History  She,  has a past  medical history of Anemia, DM II (diabetes mellitus, type II), controlled ([place]), Gait disorder, HTN (hypertension), Lymphedema, MS (multiple sclerosis) ([place]), and Pulmonary embolism ([place]).   Surgical History    Past Surgical History:  Procedure Laterality Date  . ESOPHAGOGASTRODUODENOSCOPY N/A 07/12/2016   Procedure: ESOPHAGOGASTRODUODENOSCOPY (EGD);  Surgeon: Irene Shipper, MD;  Location: [facility];  Service: Endoscopy;  Laterality: N/A;  . FLEXIBLE SIGMOIDOSCOPY Left 03/09/2015   Procedure: FLEXIBLE SIGMOIDOSCOPY;  Surgeon: Carol Ada, MD;  Location: [facility];  Service: Endoscopy;  Laterality: Left;     Social History   reports that she has quit smoking. Her smoking use included cigarettes. She has never used smokeless tobacco. She reports that she does not drink alcohol or use drugs.   Family History   Her  family history includes Multiple sclerosis in her mother.   Allergies Allergies  Allergen Reactions  . Sulfa Antibiotics Shortness Of Breath and Swelling     Home Medications  Prior to Admission medications   Medication Sig Start Date End Date Taking? Authorizing Provider  ALPRAZolam Duanne Moron) 0.5 MG tablet Take 0.5 mg by mouth 2 (two) times daily as needed for anxiety.    [provider]  apixaban (ELIQUIS) 2.5 MG TABS tablet Take 1 tablet (2.5 mg total) by mouth 2 (two) times daily. 07/14/16   Dixie Dials, MD  carvedilol (COREG) 3.125 MG tablet Take 3.125 mg by mouth 2 (two) times daily. 04/25/15   [provider]  famotidine (PEPCID) 20 MG tablet Take 1 tablet (20 mg total) by mouth 2 (two) times daily. 08/16/17 09/15/17  Arrien, Jimmy Picket, MD  furosemide (LASIX) 20 MG tablet Take 20 mg by mouth daily. On Monday, Wednesday and Friday    [provider]  glipiZIDE (GLUCOTROL) 5 MG tablet Take 1 tablet (5 mg total) by mouth 2 (two) times daily. Patient not taking: Reported on 08/07/2017 10/30/15   Charolette Forward, MD  HYDROcodone-acetaminophen  (NORCO/VICODIN) 5-325 MG per tablet Take 2 tablets by mouth every 4 (four) hours as needed for moderate pain. 03/12/15   Robbie Lis, MD  methocarbamol (ROBAXIN) 500 MG tablet Take 0.5 tablets (250 mg total) by mouth every 8 (eight) hours as needed for muscle spasms. 03/12/15   Robbie Lis, MD  potassium chloride (K-DUR,KLOR-CON) 10 MEQ tablet Take 10 mEq by mouth daily. On Monday, Wednesday and Friday    [provider]  sacubitril-valsartan (ENTRESTO) 24-26 MG Take 1 tablet by mouth 2 (two) times daily. 10/30/15   Charolette Forward, MD    The patient is critically ill with multiple organ systems failure and requires high complexity decision making for assessment and support, frequent evaluation and titration of therapies, application of advanced monitoring technologies and extensive interpretation of multiple databases. Critical Care Time devoted to patient care services described in this note independent of APP/resident time (if applicable)  is 30 minutes.   Sherrilyn Rist MD [facility] Personal pager: [phone] If unanswered, please page CCM On-call: [phone]

## 2019-02-11 NOTE — Progress Notes (Signed)
A consult was received from an 3rd shift NP for vancomycin/zosyn per pharmacy dosing until AM rounding team can address.  The patient's profile has been reviewed for ht/wt/allergies/indication/available labs.   A one time order has been placed for Vancomycin 2gm iv x1, and zosyn 3.375gm iv q8hr x2 dose.  Further antibiotics/pharmacy consults should be ordered by admitting physician if indicated.                       Thank you, Nani Skillern Crowford 02/11/2019  5:43 AM

## 2019-02-11 NOTE — Op Note (Signed)
[facility] [name] Procedure Date: 02/11/2019 MRN: [id number] Attending MD: Carol Ada , MD Date of Birth: [birth year] CSN: [id number] Age: 63 Admit Type: Inpatient Procedure:                Upper GI endoscopy Indications:              Heme positive stool, Melena Providers:                Carol Ada, MD, Angus Seller, Ladona Ridgel,                            Technician Referring MD:              Medicines:                 Complications:            No immediate complications. Estimated Blood Loss:     Estimated blood loss: none. Procedure:                Pre-Anesthesia Assessment:                           - Prior to the procedure, a History and Physical                            was performed, and patient medications and                            allergies were reviewed. The patient's tolerance of                            previous anesthesia was also reviewed. The risks                            and benefits of the procedure and the sedation                            options and risks were discussed with the patient.                            All questions were answered, and informed consent                            was obtained. Prior Anticoagulants: The patient has                            taken no previous anticoagulant or antiplatelet                            agents. ASA Grade Assessment: III - A patient with                            severe systemic disease. After reviewing the risks                            and benefits,  the patient was deemed in                            satisfactory condition to undergo the procedure.                           - Sedation was administered by an anesthesia                            professional. Deep sedation was attained.                           After obtaining informed consent, the endoscope was                            passed under direct vision. Throughout the                             procedure, the patient's blood pressure, pulse, and                            oxygen saturations were monitored continuously. The                            [id number]) Olympus gastroscope was                            introduced through the mouth, and advanced to the                            fourth part of duodenum. The upper GI endoscopy was                            accomplished without difficulty. The patient                            tolerated the procedure well. Scope In: Scope Out: Findings:      One benign-appearing, intrinsic mild stenosis was found in the lower       third of the esophagus. This stenosis measured 1.3 cm (inner diameter) x       1 cm (in length). The stenosis was traversed.      A 3 cm hiatal hernia was present.      The stomach was normal.      Four angiodysplastic lesions without bleeding were found in the duodenal       bulb and in the third portion of the duodenum. Coagulation for tissue       destruction using monopolar probe was successful.      A total of 4 small nonbleeding AVMs were ablated with APC. Two were in       the duodenal bulb and two were in the third portion of the duodenum. Impression:               - Benign-appearing esophageal stenosis.                           -  3 cm hiatal hernia.                           - Normal stomach.                           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 Treated with a monopolar probe.                           - No specimens collected. Moderate Sedation:      Not Applicable - Patient had care per Anesthesia. Recommendation:           - Return patient to hospital ward for ongoing care.                           - Resume regular diet.                           - Continue present medications.                           - Monitor HGB and transfuse if necessary.                           - Resume anticoagulation in 3  days. Procedure Code(s):        --- Professional ---                           [phone], Esophagogastroduodenoscopy, flexible,                            transoral; with ablation of tumor(s), polyp(s), or                            other lesion(s) (includes pre- and post-dilation                            and guide wire passage, when performed) Diagnosis Code(s):        --- Professional ---                           K22.2, Esophageal obstruction                           K44.9, Diaphragmatic hernia without obstruction or                            gangrene                           K31.819, Angiodysplasia of stomach and duodenum                            without bleeding  R19.5, Other fecal abnormalities                           K92.1, Melena (includes Hematochezia) CPT copyright 2019 [employer]. All rights reserved. The codes documented in this report are preliminary and upon coder review may  be revised to meet current compliance requirements. Carol Ada, MD Carol Ada, MD 02/11/2019 4:07:05 PM This report has been signed electronically. Number of Addenda: 0

## 2019-02-11 NOTE — Transfer of Care (Signed)
Immediate Anesthesia Transfer of Care Note  Patient: Jocelyn Sanchez  Procedure(s) Performed: ESOPHAGOGASTRODUODENOSCOPY (EGD) (N/A ) HOT HEMOSTASIS (ARGON PLASMA COAGULATION/BICAP) (N/A )  Patient Location: PACU and Endoscopy Unit  Anesthesia Type:MAC  Level of Consciousness: sedated  Airway & Oxygen Therapy: Patient Spontanous Breathing and Patient connected to nasal cannula oxygen  Post-op Assessment: Report given to RN and Post -op Vital signs reviewed and stable  Post vital signs: Reviewed and stable  Last Vitals:  Vitals Value Taken Time  BP    Temp    Pulse    Resp    SpO2      Last Pain:  Vitals:   02/11/19 1503  TempSrc: Oral  PainSc: 0-No pain         Complications: No apparent anesthesia complications

## 2019-02-11 NOTE — Consult Note (Signed)
Reason for Consult: GI bleed Referring Physician: [facility]  Jocelyn Sanchez HPI: This is a 63 year old femaile with a PMH of MS, HTN, esophagitis and small nonbleeding AVM (EGD 2018), and chronic anemia admitted for a change in her clinical status.  She was noted to be sleepier by her sister and suffering with vomiting.l  Melena was noted and she was brought into the hospital for further evaluation.  Her HGB on admission was at 2.7 g/dL and her baseline is around 8.9 g/dL.  She underwent a transfusion and her most recent HGB reading is at 12.8 g/dL.  In 2018 an EGD was performed by Dr. Henrene Pastor for coffee-ground emesis.  A mild esophagitis was identified as well as a small nonbleeding duodenal bulb AVM.  The patient was on anticoagulation for a prior history of PE.  Past Medical History:  Diagnosis Date  . Anemia   . DM II (diabetes mellitus, type II), controlled ([place])   . Gait disorder   . HTN (hypertension)   . Lymphedema   . MS (multiple sclerosis) ([place])   . Pulmonary embolism [facility])     Past Surgical History:  Procedure Laterality Date  . ESOPHAGOGASTRODUODENOSCOPY N/A 07/12/2016   Procedure: ESOPHAGOGASTRODUODENOSCOPY (EGD);  Surgeon: Irene Shipper, MD;  Location: [facility];  Service: Endoscopy;  Laterality: N/A;  . FLEXIBLE SIGMOIDOSCOPY Left 03/09/2015   Procedure: FLEXIBLE SIGMOIDOSCOPY;  Surgeon: Carol Ada, MD;  Location: [facility];  Service: Endoscopy;  Laterality: Left;    Family History  Problem Relation Age of Onset  . Multiple sclerosis Mother     Social History:  reports that she has quit smoking. Her smoking use included cigarettes. She has never used smokeless tobacco. She reports that she does not drink alcohol or use drugs.  Allergies:  Allergies  Allergen Reactions  . Sulfa Antibiotics Shortness Of Breath and Swelling    Medications:  Scheduled: . Chlorhexidine Gluconate Cloth  6 each Topical Daily  . insulin aspart  0-15 Units Subcutaneous Q4H   . [START ON 02/14/2019] pantoprazole  40 mg Intravenous Q12H   Continuous: . pantoprozole (PROTONIX) infusion 8 mg/hr (02/11/19 0946)  . phenylephrine (NEO-SYNEPHRINE) Adult infusion Stopped (02/11/19 1211)    Results for orders placed or performed during the hospital encounter of 02/10/19 (from the past 24 hour(s))  Prepare RBC     Status: None   Collection Time: 02/10/19  2:48 PM  Result Value Ref Range   Order Confirmation      ORDER PROCESSED BY BLOOD BANK Performed at [facility], [place] [address] [place], [place] [address]   CBC with Differential/Platelet     Status: Abnormal   Collection Time: 02/10/19  2:49 PM  Result Value Ref Range   WBC 16.4 (H) 4.0 - 10.5 K/uL   RBC 1.43 (L) 3.87 - 5.11 MIL/uL   Hemoglobin 2.7 (LL) 12.0 - 15.0 g/dL   HCT 10.6 (L) 36.0 - 46.0 %   MCV 74.1 (L) 80.0 - 100.0 fL   MCH 18.9 (L) 26.0 - 34.0 pg   MCHC 25.5 (L) 30.0 - 36.0 g/dL   RDW 21.3 (H) 11.5 - 15.5 %   Platelets 771 (H) 150 - 400 K/uL   nRBC 1.6 (H) 0.0 - 0.2 %   Neutrophils Relative % 84 %   Neutro Abs 13.8 (H) 1.7 - 7.7 K/uL   Lymphocytes Relative 8 %   Lymphs Abs 1.3 0.7 - 4.0 K/uL   Monocytes Relative 6 %  Monocytes Absolute 1.0 0.1 - 1.0 K/uL   Eosinophils Relative 1 %   Eosinophils Absolute 0.1 0.0 - 0.5 K/uL   Basophils Relative 0 %   Basophils Absolute 0.0 0.0 - 0.1 K/uL   Immature Granulocytes 1 %   Abs Immature Granulocytes 0.16 (H) 0.00 - 0.07 K/uL   Schistocytes PRESENT    Tear Drop Cells PRESENT    Target Cells PRESENT   Comprehensive metabolic panel     Status: Abnormal   Collection Time: 02/10/19  2:49 PM  Result Value Ref Range   Sodium 131 (L) 135 - 145 mmol/L   Potassium 4.3 3.5 - 5.1 mmol/L   Chloride 110 98 - 111 mmol/L   CO2 12 (L) 22 - 32 mmol/L   Glucose, Bld 135 (H) 70 - 99 mg/dL   BUN 68 (H) 8 - 23 mg/dL   Creatinine, Ser 2.24 (H) 0.44 - 1.00 mg/dL   Calcium 7.8 (L) 8.9 - 10.3 mg/dL   Total Protein 6.1 (L) 6.5 - 8.1 g/dL    Albumin 2.3 (L) 3.5 - 5.0 g/dL   AST 8 (L) 15 - 41 U/L   ALT 7 0 - 44 U/L   Alkaline Phosphatase 81 38 - 126 U/L   Total Bilirubin 0.7 0.3 - 1.2 mg/dL   GFR calc non Af Amer 22 (L) >60 mL/min   GFR calc Af Amer 26 (L) >60 mL/min   Anion gap 9 5 - 15  Lipase, blood     Status: Abnormal   Collection Time: 02/10/19  2:49 PM  Result Value Ref Range   Lipase 999 (H) 11 - 51 U/L  Troponin I (High Sensitivity)     Status: None   Collection Time: 02/10/19  2:49 PM  Result Value Ref Range   Troponin I (High Sensitivity) 2 <18 ng/L  Glucose, capillary     Status: Abnormal   Collection Time: 02/10/19  7:46 PM  Result Value Ref Range   Glucose-Capillary 122 (H) 70 - 99 mg/dL  Glucose, capillary     Status: Abnormal   Collection Time: 02/10/19 11:41 PM  Result Value Ref Range   Glucose-Capillary 100 (H) 70 - 99 mg/dL  CBC with Differential/Platelet     Status: Abnormal   Collection Time: 02/11/19 12:48 AM  Result Value Ref Range   WBC 17.0 (H) 4.0 - 10.5 K/uL   RBC 5.13 (H) 3.87 - 5.11 MIL/uL   Hemoglobin 12.8 12.0 - 15.0 g/dL   HCT 40.9 36.0 - 46.0 %   MCV 79.7 (L) 80.0 - 100.0 fL   MCH 25.0 (L) 26.0 - 34.0 pg   MCHC 31.3 30.0 - 36.0 g/dL   RDW 24.2 (H) 11.5 - 15.5 %   Platelets 534 (H) 150 - 400 K/uL   nRBC 2.5 (H) 0.0 - 0.2 %   Neutrophils Relative % 89 %   Neutro Abs 15.1 (H) 1.7 - 7.7 K/uL   Lymphocytes Relative 4 %   Lymphs Abs 0.7 0.7 - 4.0 K/uL   Monocytes Relative 5 %   Monocytes Absolute 0.8 0.1 - 1.0 K/uL   Eosinophils Relative 1 %   Eosinophils Absolute 0.1 0.0 - 0.5 K/uL   Basophils Relative 0 %   Basophils Absolute 0.0 0.0 - 0.1 K/uL   Immature Granulocytes 1 %   Abs Immature Granulocytes 0.15 (H) 0.00 - 0.07 K/uL   Polychromasia PRESENT    Target Cells PRESENT   APTT     Status: Abnormal  Collection Time: 02/11/19 12:48 AM  Result Value Ref Range   aPTT 47 (H) 24 - 36 seconds  Protime-INR     Status: Abnormal   Collection Time: 02/11/19 12:48 AM  Result  Value Ref Range   Prothrombin Time 19.9 (H) 11.4 - 15.2 seconds   INR 1.7 (H) 0.8 - 1.2  Basic metabolic panel     Status: Abnormal   Collection Time: 02/11/19  2:03 AM  Result Value Ref Range   Sodium 135 135 - 145 mmol/L   Potassium 5.2 (H) 3.5 - 5.1 mmol/L   Chloride 113 (H) 98 - 111 mmol/L   CO2 13 (L) 22 - 32 mmol/L   Glucose, Bld 100 (H) 70 - 99 mg/dL   BUN 48 (H) 8 - 23 mg/dL   Creatinine, Ser 1.61 (H) 0.44 - 1.00 mg/dL   Calcium 8.0 (L) 8.9 - 10.3 mg/dL   GFR calc non Af Amer 33 (L) >60 mL/min   GFR calc Af Amer 39 (L) >60 mL/min   Anion gap 9 5 - 15  Phosphorus     Status: Abnormal   Collection Time: 02/11/19  2:03 AM  Result Value Ref Range   Phosphorus 6.0 (H) 2.5 - 4.6 mg/dL  Hemoglobin A1c     Status: Abnormal   Collection Time: 02/11/19  2:03 AM  Result Value Ref Range   Hgb A1c MFr Bld 5.9 (H) 4.8 - 5.6 %   Mean Plasma Glucose 122.63 mg/dL  Glucose, capillary     Status: Abnormal   Collection Time: 02/11/19  3:32 AM  Result Value Ref Range   Glucose-Capillary 115 (H) 70 - 99 mg/dL  Glucose, capillary     Status: Abnormal   Collection Time: 02/11/19  8:12 AM  Result Value Ref Range   Glucose-Capillary 128 (H) 70 - 99 mg/dL  Glucose, capillary     Status: Abnormal   Collection Time: 02/11/19 11:40 AM  Result Value Ref Range   Glucose-Capillary 126 (H) 70 - 99 mg/dL     Dg Chest Port 1 View  Result Date: 02/10/2019 CLINICAL DATA:  Shortness of breath EXAM: PORTABLE CHEST 1 VIEW COMPARISON:  August 10, 2017 FINDINGS: No edema or consolidation. Heart size and pulmonary vascularity are normal. No adenopathy. There is aortic atherosclerosis. No bone lesions. IMPRESSION: No edema or consolidation. Heart size normal. Aortic Atherosclerosis (ICD10-I70.0). Electronically Signed   By: Lowella Grip III M.D.   On: 02/10/2019 14:15   Vas [place] Lower Extremity Venous (dvt)  Result Date: 02/11/2019  Lower Venous Study Indications: Edema.  Risk Factors: DVT History of  DVT 10/27/2015 patient is normally on chronic anticoagulant which was given for a previous DVT in 2017 which had to be discontinued due to a GI bleed. Limitations: Severe limitation due to contractures and rotation of the legs. Comparison Study: Positive in 2017 Performing Technologist: Toma Copier RVS  Examination Guidelines: A complete evaluation includes B-mode imaging, spectral Doppler, color Doppler, and power Doppler as needed of all accessible portions of each vessel. Bilateral testing is considered an integral part of a complete examination. Limited examinations for reoccurring indications may be performed as noted.  +---------+---------------+---------+-----------+----------+--------------+ RIGHT    CompressibilityPhasicitySpontaneityPropertiesThrombus Aging +---------+---------------+---------+-----------+----------+--------------+ CFV      Full           Yes      Yes                                 +---------+---------------+---------+-----------+----------+--------------+  SFJ      Full                                                        +---------+---------------+---------+-----------+----------+--------------+ FV Prox  Full           Yes      Yes                                 +---------+---------------+---------+-----------+----------+--------------+ FV Mid   Full                                                        +---------+---------------+---------+-----------+----------+--------------+ FV DistalFull           Yes      Yes                                 +---------+---------------+---------+-----------+----------+--------------+ PFV      Full           Yes      Yes                                 +---------+---------------+---------+-----------+----------+--------------+ POP      Full           Yes      Yes                                 +---------+---------------+---------+-----------+----------+--------------+ PTV      Full                                                         +---------+---------------+---------+-----------+----------+--------------+ PERO                                                  Not visualized +---------+---------------+---------+-----------+----------+--------------+   Right Technical Findings: Not visualized segments include Peroneal. All segments of the veins throughout could not be visualized due to contractures and rotation of the leg  +---------+---------------+---------+-----------+----------+-------------------+ LEFT     CompressibilityPhasicitySpontaneityPropertiesThrombus Aging      +---------+---------------+---------+-----------+----------+-------------------+ CFV      Full                                                             +---------+---------------+---------+-----------+----------+-------------------+ SFJ      Full                                                             +---------+---------------+---------+-----------+----------+-------------------+  FV Prox  Full                                                             +---------+---------------+---------+-----------+----------+-------------------+ FV Mid   Full                                                             +---------+---------------+---------+-----------+----------+-------------------+ FV DistalFull                                                             +---------+---------------+---------+-----------+----------+-------------------+ PFV      Full                                                             +---------+---------------+---------+-----------+----------+-------------------+ POP      Full                                                             +---------+---------------+---------+-----------+----------+-------------------+ PTV                                                   Not visualized due                                                         to rotation of the                                                        leg                 +---------+---------------+---------+-----------+----------+-------------------+ PERO                                                  Not visualized due  to rotation of the                                                        lef                 +---------+---------------+---------+-----------+----------+-------------------+  Left Technical Findings: Not visualized segments include peroneal and posterior tibial veins. peroneal and Posterior tibial veins could not be visualized due to rotation of the leg   Summary: Right: There is no obvious evidence of deep vein thrombosis in the lower extremity. However, portions of this examination were limited. No cystic structure found in the popliteal fossa. See technical findings listed above Left: There is no obvious evidence of deep vein thrombosis in the lower extremity. However, portions of this examination were limited. No cystic structure found in the popliteal fossa. See technical findings listed above.  *See table(s) above for measurements and observations. Electronically signed by Deitra Mayo MD on 02/11/2019 at 1:24:18 PM.    Final     ROS:  As stated above in the HPI otherwise negative.  Blood pressure (!) 118/55, pulse 75, temperature (!) 97.4 F (36.3 C), temperature source Oral, resp. rate 12, height 5\' 3"  (1.6 m), weight 67.9 kg, SpO2 100 %.    PE: Gen: NAD, Alert and Oriented, very slow speech (presumed baseline) HEENT:  [place]/AT, EOMI Neck: Supple, no LAD Lungs: CTA Bilaterally CV: RRR without M/G/R ABM: Soft, NTND, +BS Ext: No C/C/E  Assessment/Plan: 1) GI bleed. 2) Anemia. 3) Heme positive stool.   It not clear if her HGB was truly at 2.7 g/dL, but she is clinically stable. With the findings of melena, heme positive stool, and  a personal history of esophagitis on anticoagulation, an EGD will be performed.  Plan: 1) EGD today.  Bary Limbach D 02/11/2019, 2:24 PM

## 2019-02-11 NOTE — Anesthesia Preprocedure Evaluation (Addendum)
Anesthesia Evaluation  Patient identified by MRN, date of birth, ID band Patient awake    Reviewed: Allergy & Precautions, NPO status , Patient's Chart, lab work & pertinent test results  History of Anesthesia Complications Negative for: history of anesthetic complications  Airway Mallampati: II  TM Distance: >3 FB Neck ROM: Full    Dental  (+) Partial Upper   Pulmonary former smoker, PE   Pulmonary exam normal        Cardiovascular hypertension, Pt. on medications and Pt. on home beta blockers + Peripheral Vascular Disease and +CHF  Normal cardiovascular exam     Neuro/Psych MS negative psych ROS   GI/Hepatic Neg liver ROS, GI bleed, Hgb 2.7 on arrival, transfused to 12.8   Endo/Other  diabetes  Renal/GU negative Renal ROS  negative genitourinary   Musculoskeletal negative musculoskeletal ROS (+)   Abdominal   Peds  Hematology  (+) anemia ,   Anesthesia Other Findings Echo 08/09/17: Mild to moderate LV dysfunction (EF 27-07); mild diastolic dysfunction; mild LVH; mild MR; mild TR with mildly elevated pulmonary pressure.  Reproductive/Obstetrics                            Anesthesia Physical Anesthesia Plan  ASA: IV  Anesthesia Plan: MAC   Post-op Pain Management:    Induction: Intravenous  PONV Risk Score and Plan: Propofol infusion, TIVA and Treatment may vary due to age or medical condition  Airway Management Planned: Natural Airway and Nasal Cannula  Additional Equipment: None  Intra-op Plan:   Post-operative Plan:   Informed Consent: I have reviewed the patients History and Physical, chart, labs and discussed the procedure including the risks, benefits and alternatives for the proposed anesthesia with the patient or authorized representative who has indicated his/her understanding and acceptance.       Plan Discussed with:   Anesthesia Plan Comments:         Anesthesia Quick Evaluation

## 2019-02-11 NOTE — Progress Notes (Signed)
Bilateral lower extremity venous duplex completed. Preliminary results in Chart review CV Proc. [facility], RVS  02/11/2019,9:13 AM

## 2019-02-11 NOTE — Progress Notes (Signed)
[facility] Progress Note Patient Name: CATRICE BERRIDGE DOB: 1955-08-14 MRN: [id number]   Date of Service  02/11/2019  HPI/Events of Note  Hypotension - BP = 67/35 with MAP = 44. HR = 70. Patient is finishing 4th unit PRBC. No CVL or CVP.   eICU Interventions  Will order: 1. CBC with platelets, PT/INR, PTT and ionized Ca++ STAT.  2. Phenylephrine IV infusion. Titrate to MAP >= 65.      Intervention Category Major Interventions: Hypotension - evaluation and management  Romolo Sieling Cornelia Copa 02/11/2019, 12:49 AM

## 2019-02-11 NOTE — Anesthesia Procedure Notes (Signed)
Procedure Name: MAC Date/Time: 02/11/2019 3:42 PM Performed by: Cynda Familia, CRNA Pre-anesthesia Checklist: Patient identified, Emergency Drugs available, Suction available, Patient being monitored and Timeout performed Patient Re-evaluated:Patient Re-evaluated prior to induction Oxygen Delivery Method: Nasal cannula Airway Equipment and Method: Bite block Placement Confirmation: positive ETCO2 and breath sounds checked- equal and bilateral Dental Injury: Teeth and Oropharynx as per pre-operative assessment

## 2019-02-12 LAB — GLUCOSE, CAPILLARY
Glucose-Capillary: 111 mg/dL — ABNORMAL HIGH (ref 70–99)
Glucose-Capillary: 116 mg/dL — ABNORMAL HIGH (ref 70–99)
Glucose-Capillary: 119 mg/dL — ABNORMAL HIGH (ref 70–99)
Glucose-Capillary: 128 mg/dL — ABNORMAL HIGH (ref 70–99)
Glucose-Capillary: 186 mg/dL — ABNORMAL HIGH (ref 70–99)
Glucose-Capillary: 196 mg/dL — ABNORMAL HIGH (ref 70–99)

## 2019-02-12 LAB — MRSA PCR SCREENING: MRSA by PCR: NEGATIVE

## 2019-02-12 LAB — CALCIUM, IONIZED: Calcium, Ionized, Serum: 4.6 mg/dL (ref 4.5–5.6)

## 2019-02-12 MED ORDER — METHOCARBAMOL 500 MG PO TABS
500.0000 mg | ORAL_TABLET | Freq: Three times a day (TID) | ORAL | Status: DC
  Administered 2019-02-12 – 2019-02-15 (×10): 500 mg via ORAL
  Filled 2019-02-12 (×10): qty 1

## 2019-02-12 MED ORDER — FUROSEMIDE 20 MG PO TABS
20.0000 mg | ORAL_TABLET | Freq: Every day | ORAL | Status: DC
  Administered 2019-02-12 – 2019-02-15 (×4): 20 mg via ORAL
  Filled 2019-02-12 (×4): qty 1

## 2019-02-12 MED ORDER — SACUBITRIL-VALSARTAN 24-26 MG PO TABS
1.0000 | ORAL_TABLET | Freq: Two times a day (BID) | ORAL | Status: DC
  Administered 2019-02-12 – 2019-02-15 (×7): 1 via ORAL
  Filled 2019-02-12 (×7): qty 1

## 2019-02-12 MED ORDER — CARVEDILOL 3.125 MG PO TABS
3.1250 mg | ORAL_TABLET | Freq: Two times a day (BID) | ORAL | Status: DC
  Administered 2019-02-12 – 2019-02-13 (×2): 3.125 mg via ORAL
  Filled 2019-02-12 (×2): qty 1

## 2019-02-12 MED ORDER — PANTOPRAZOLE SODIUM 40 MG PO TBEC
40.0000 mg | DELAYED_RELEASE_TABLET | Freq: Every day | ORAL | Status: DC
  Administered 2019-02-12 – 2019-02-15 (×4): 40 mg via ORAL
  Filled 2019-02-12 (×4): qty 1

## 2019-02-12 NOTE — Plan of Care (Signed)

## 2019-02-12 NOTE — Progress Notes (Signed)
Patient to transfer to 3W 1338, report given to receiving nurse, all questions answered at this time.  Pt. VSS with no s/s of distress noted.  Pt. Stable at transfer.

## 2019-02-12 NOTE — Progress Notes (Signed)
NAME:  Jocelyn Sanchez, MRN:  [id number], DOB:  1955/10/11, LOS: 2 ADMISSION DATE:  02/10/2019, CONSULTATION DATE:  02/10/2019 REFERRING MD:  Ed physician, CHIEF COMPLAINT:  Gi bleed   Brief History   Patient with a history of multiple sclerosis  Brought in by sister following noting that she was not feeling very well More sleepy than usual, not able to keep food down, black tarry stools Background history of diabetes, hypertension, peripheral vascular disease gastric and duodenal angiodysplasia She is on anticoagulation for a previous PE  Treated in the hospital February 2019 for sepsis Previous upper endoscopy January 2018 revealed mild esophagitis with stricture, 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  History of present illness   Progressive symptoms over 3 to 4 days led to presentation to the hospital Patient herself states she feels fine Has not had any fevers or chills Sister noted melena No blood noted in emesis Multiple bouts of emesis Has not been able to keep food down  Past Medical History   Past Medical History:  Diagnosis Date  . Anemia   . DM II (diabetes mellitus, type II), controlled ([place])   . Gait disorder   . HTN (hypertension)   . Lymphedema   . MS (multiple sclerosis) ([place])   . Pulmonary embolism ([place])    Significant Hospital Events   Mental status improving No overnight events Still requiring pressors  Consults:  GI  Procedures:  EGD 02/11/2019-nonbleeding AVMs  Significant Diagnostic Tests:  EGD  Micro Data:  Blood culture 8/24/202-no growth  Antimicrobials:  Vancomycin 02/10/19 Zosyn 02/10/2019  Interim history/subjective:  She feels better Denies any pain or discomfort   Objective   Blood pressure 92/68, pulse 95, temperature 97.9 F (36.6 C), temperature source Oral, resp. rate 19, height 5\' 3"  (1.6 m), weight 67 kg, SpO2 100 %.        Intake/Output Summary (Last 24 hours) at 02/12/2019 0900 Last data filed at 02/12/2019 [id number] Gross  per 24 hour  Intake 1340.82 ml  Output 1700 ml  Net -359.18 ml   Filed Weights   02/11/19 0427 02/12/19 0500  Weight: 67.9 kg 67 kg    Examination: Middle-age lady, does not appear to be in distress Moist oral mucosa Clear breath sounds bilaterally S1-S2 appreciated Abdomen is soft, bowel sounds appreciated No clubbing, no edema Alert and oriented to person and place  Chest x-ray-no acute infiltrate, reviewed by myself  Resolved Hospital Problem list     Assessment & Plan:   GI bleed -Posttransfusion -PPI-switch to oral Protonix -Hold anticoagulation -Anticoagulation may be resumed 02/14/2019  Acute kidney injury -Secondary to hypovolemic shock -Prerenal -Trend electrolytes, avoid nephrotoxic's  Hypovolemic shock -Post blood transfusion -Successfully weaned off pressors  Chronic systolic congestive heart failure -On Coreg 3.125 twice a day-we will resume -On Lasix 20 mg daily-we will resume -Entresto 24-26 twice daily-we will resume  History of pulmonary embolism -Not having any evidence of acute active bleeding at present -EGD report as noted -To resume anticoagulation after 3 days-May be resumed on 28  Diabetes -Was on glipizide -On hold at present -SSI  Multiple sclerosis -Chronic debility -On muscle relaxant Robaxin-we will resume -On Vicodin-we will resume  Patient has remained hemodynamically stable No evidence of ongoing bleeding Will transfer to hospitalist service  Best practice:  Diet: Liquid diet Pain/Anxiety/Delirium protocol (if indicated): Not indicated VAP protocol (if indicated): Not indicated DVT prophylaxis: Hold anticoagulation at present GI prophylaxis: PPI Glucose control: SSI Mobility: Bedridden Code Status: Full code  Family Communication: Discussed with sister at bedside Disposition: ICU  Labs   CBC: Recent Labs  Lab 02/10/19 1449 02/11/19 0048  WBC 16.4* 17.0*  NEUTROABS 13.8* 15.1*  HGB 2.7* 12.8  HCT 10.6*  40.9  MCV 74.1* 79.7*  PLT 771* 534*    Basic Metabolic Panel: Recent Labs  Lab 02/10/19 1449 02/11/19 0203  NA 131* 135  K 4.3 5.2*  CL 110 113*  CO2 12* 13*  GLUCOSE 135* 100*  BUN 68* 48*  CREATININE 2.24* 1.61*  CALCIUM 7.8* 8.0*  PHOS  --  6.0*   GFR: Estimated Creatinine Clearance: 32.4 mL/min (A) (by C-G formula based on SCr of 1.61 mg/dL (H)). Recent Labs  Lab 02/10/19 1324 02/10/19 1449 02/11/19 0048  WBC  --  16.4* 17.0*  LATICACIDVEN 1.5  --   --     Liver Function Tests: Recent Labs  Lab 02/10/19 1449  AST 8*  ALT 7  ALKPHOS 81  BILITOT 0.7  PROT 6.1*  ALBUMIN 2.3*   Recent Labs  Lab 02/10/19 1449  LIPASE 999*   No results for input(s): AMMONIA in the last 168 hours.  ABG    Component Value Date/Time   HCO3 16.8 (L) 08/07/2017 2305   TCO2 18 (L) 08/07/2017 2310   ACIDBASEDEF 7.4 (H) 08/07/2017 2305   O2SAT 45.1 08/07/2017 2305     Coagulation Profile: Recent Labs  Lab 02/10/19 1324 02/11/19 0048  INR 2.0* 1.7*    Cardiac Enzymes: No results for input(s): CKTOTAL, CKMB, CKMBINDEX, TROPONINI in the last 168 hours.  HbA1C: Hgb A1c MFr Bld  Date/Time Value Ref Range Status  02/11/2019 02:03 AM 5.9 (H) 4.8 - 5.6 % Final    Comment:    (NOTE) Pre diabetes:          5.7%-6.4% Diabetes:              >6.4% Glycemic control for   <7.0% adults with diabetes   11/28/2014 11:15 AM 6.7 (H) 4.8 - 5.6 % Final    Comment:    (NOTE)         Pre-diabetes: 5.7 - 6.4         Diabetes: >6.4         Glycemic control for adults with diabetes: <7.0     CBG: Recent Labs  Lab 02/11/19 1654 02/11/19 1954 02/11/19 2332 02/12/19 0346 02/12/19 0808  GLUCAP 100* 123* 111* 119* 111*    Review of Systems:    No significant complaints Denies myalgias Denies any pain or discomfort  Past Medical History  She,  has a past medical history of Anemia, DM II (diabetes mellitus, type II), controlled ([place]), Gait disorder, HTN (hypertension),  Lymphedema, MS (multiple sclerosis) ([place]), and Pulmonary embolism ([place]).   Surgical History    Past Surgical History:  Procedure Laterality Date  . ESOPHAGOGASTRODUODENOSCOPY N/A 07/12/2016   Procedure: ESOPHAGOGASTRODUODENOSCOPY (EGD);  Surgeon: Irene Shipper, MD;  Location: [facility];  Service: Endoscopy;  Laterality: N/A;  . FLEXIBLE SIGMOIDOSCOPY Left 03/09/2015   Procedure: FLEXIBLE SIGMOIDOSCOPY;  Surgeon: Carol Ada, MD;  Location: [facility];  Service: Endoscopy;  Laterality: Left;     Social History   reports that she has quit smoking. Her smoking use included cigarettes. She has never used smokeless tobacco. She reports that she does not drink alcohol or use drugs.   Family History   Her family history includes Multiple sclerosis in her mother.   Allergies Allergies  Allergen Reactions  . Sulfa  Antibiotics Shortness Of Breath and Swelling    Sherrilyn Rist, MD [place], PCCM Cell: [phone]

## 2019-02-13 ENCOUNTER — Encounter (HOSPITAL_COMMUNITY): Payer: Self-pay | Admitting: Gastroenterology

## 2019-02-13 DIAGNOSIS — K922 Gastrointestinal hemorrhage, unspecified: Secondary | ICD-10-CM

## 2019-02-13 LAB — GLUCOSE, CAPILLARY
Glucose-Capillary: 105 mg/dL — ABNORMAL HIGH (ref 70–99)
Glucose-Capillary: 112 mg/dL — ABNORMAL HIGH (ref 70–99)
Glucose-Capillary: 119 mg/dL — ABNORMAL HIGH (ref 70–99)
Glucose-Capillary: 121 mg/dL — ABNORMAL HIGH (ref 70–99)
Glucose-Capillary: 147 mg/dL — ABNORMAL HIGH (ref 70–99)
Glucose-Capillary: 165 mg/dL — ABNORMAL HIGH (ref 70–99)

## 2019-02-13 MED ORDER — CARVEDILOL PHOSPHATE ER 10 MG PO CP24
10.0000 mg | ORAL_CAPSULE | Freq: Every day | ORAL | Status: DC
  Administered 2019-02-13 – 2019-02-15 (×3): 10 mg via ORAL
  Filled 2019-02-13 (×3): qty 1

## 2019-02-13 NOTE — Care Management Important Message (Signed)
Important Message  Patient Details IM Letter given to Velva Harman RN to present to the [name] MRN: [id number] Date of Birth: Jun 30, 1955   Medicare Important Message Given:  Yes     Kerin Salen 02/13/2019, 12:03 PM

## 2019-02-13 NOTE — Anesthesia Postprocedure Evaluation (Signed)
Anesthesia Post Note  Patient: Jocelyn Sanchez  Procedure(s) Performed: ESOPHAGOGASTRODUODENOSCOPY (EGD) (N/A ) HOT HEMOSTASIS (ARGON PLASMA COAGULATION/BICAP) (N/A )     Patient location during evaluation: PACU Anesthesia Type: MAC Level of consciousness: awake and alert Pain management: pain level controlled Vital Signs Assessment: post-procedure vital signs reviewed and stable Respiratory status: spontaneous breathing, nonlabored ventilation and respiratory function stable Cardiovascular status: blood pressure returned to baseline and stable Postop Assessment: no apparent nausea or vomiting Anesthetic complications: no    Last Vitals:  Vitals:   02/12/19 1612 02/12/19 2110  BP: (!) 105/56 114/64  Pulse: 94 99  Resp: 16 16  Temp: 36.6 C   SpO2: 100%     Last Pain:  Vitals:   02/12/19 2100  TempSrc:   PainSc: 3    Pain Goal:                   [name]

## 2019-02-13 NOTE — Plan of Care (Signed)

## 2019-02-13 NOTE — Progress Notes (Addendum)
PROGRESS NOTE  Jocelyn Sanchez [id number] DOB: 09-Feb-1956 DOA: 02/10/2019 PCP: Dixie Dials, MD  Brief History   63 year old African-American female with multiple sclerosis diabetes mellitus type 2 HTN prior pulmonary embolism May 2017 anemia Hospitalized with septic shock 08/16/2017 along with sacral decubitus stage IV C. difficile Prior GI bleed 06/2016 esophagitis, duodenal bulb AV malformation-bleeding source not identified at that time  Admitted by critical care with nausea vomiting melena hemoglobin 2.7-transfused-EGD performed as below Required pressors briefly and ICU setting Transferred to [facility]ad hospitalist service 8/27   A & P  Acute upper GI bleed leading to hypovolemic shock Appreciate GI Dr. 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 Doylene Canard to make this call patient is now 3 years out --for now would HOLD ac until seen in OP with Dr. Leafy Half call and discuss with him for OP follow up and discussions Stage IV decubitus ulcer, sacral decubiti Wound looks clean continue occlusive dressing Morbid obesity Diastolic heart failure Continue Lasix when able-holding Coreg 3.125 bid--change to 10 CD long-acting beta-blocker hold Entresto twice daily at this time--check labs  Cont lasix Diabetes mellitus type 2 Home Meds glipizide 5 twice daily At this time continue sliding scale coverage on clear liquid diet Hypotension in the setting of prior hypertension See above discussion Prior urinary/pulmonary source of sepsis 2019, Prior C. difficile  2019   DVT prophylaxis: scd Code Status: full Family Communication: none present---called Son Derek--no HCPOA assigned--she mentions multiple family members to update so we will ask chaplain to help with this Disposition Plan: Inpatient pending full diet and GI input who I will call today   Verneita Griffes, MD [facility] 7:38 AM  02/13/2019, 7:38 AM  LOS: 3 days   Consultants  . Gastroenterology  Procedures  . EGD as above  Antibiotics  . None  Interval History/Subjective  Awake coherent no distress Slow mentation Coherent however and can tell me multiple orienting things  Objective   Vitals:  Vitals:   02/12/19 2110 02/13/19 0625  BP: 114/64 98/64  Pulse: 99 97  Resp: 16 16  Temp:  98.9 F (37.2 C)  SpO2:  100%    Exam:  Thick neck Mallampati 4 edentulous upper no JVD Chest clear no added sound poor exam given obesity Abdomen soft no central epigastric tenderness no rebound No lower extremity edema I took off her Prevalon boots on the left side she has a stage I on her heel right side is okay Below is picture of her sacral decubitus which seems clean she has a pure wick in place in addition      I have personally reviewed the following:   Today's Data  . No labs today  Lab Data  . No labs  Micro Data  . None  Imaging  . No  Cardiology Data  . No  Other Data    Scheduled Meds: . carvedilol  10 mg Oral Daily  . Chlorhexidine Gluconate Cloth  6 each Topical Daily  . furosemide  20 mg Oral Daily  . insulin aspart  0-15 Units Subcutaneous Q4H  . methocarbamol  500 mg Oral TID  . pantoprazole  40 mg Oral  Daily  . sacubitril-valsartan  1 tablet Oral BID   Continuous Infusions:  Active Problems:   GI bleed   LOS: 3 days   How to contact the [facility] Attending or Consulting provider [place] or covering provider during after hours [place], for this patient?  1. Check the care team in [facility] and look for a) attending/consulting TRH provider  listed and b) the [facility] team listed 2. Log into [email or web address] and use [place]'s universal password to access. If you do not have the password, please contact the hospital operator. 3. Locate the [facility] provider you are looking for under [facility] and page to a number that you can be directly reached. 4. If you still have difficulty reaching the provider, please page the [facility] (Director on Call) for the Hospitalists listed on amion for assistance.

## 2019-02-13 NOTE — Progress Notes (Signed)
Subjective: No acute events.  Objective: Vital signs in last 24 hours: Temp:  [97.8 F (36.6 C)-98.9 F (37.2 C)] 98.1 F (36.7 C) (08/27 1402) Pulse Rate:  [91-99] 91 (08/27 1402) Resp:  [14-16] 14 (08/27 1402) BP: (90-114)/(56-64) 90/56 (08/27 1402) SpO2:  [100 %] 100 % (08/27 1402) Weight:  [66.3 kg] 66.3 kg (08/27 0500) Last BM Date: 02/10/19  Intake/Output from previous day: 08/26 0701 - 08/27 0700 In: 683.8 [P.O.:580; I.V.:103.8] Out: 2000 [Urine:2000] Intake/Output this shift: Total I/O In: 360 [P.O.:360] Out: 150 [Urine:150]  General appearance: sleeping soundly GI: soft, non-tender; bowel sounds normal; no masses,  no organomegaly  Lab Results: Recent Labs    02/11/19 0048  WBC 17.0*  HGB 12.8  HCT 40.9  PLT 534*   BMET Recent Labs    02/11/19 0203  NA 135  K 5.2*  CL 113*  CO2 13*  GLUCOSE 100*  BUN 48*  CREATININE 1.61*  CALCIUM 8.0*   LFT No results for input(s): PROT, ALBUMIN, AST, ALT, ALKPHOS, BILITOT, BILIDIR, IBILI in the last 72 hours. PT/INR Recent Labs    02/11/19 0048  LABPROT 19.9*  INR 1.7*   Hepatitis Panel No results for input(s): HEPBSAG, HCVAB, HEPAIGM, HEPBIGM in the last 72 hours. C-Diff No results for input(s): CDIFFTOX in the last 72 hours. Fecal Lactopherrin No results for input(s): FECLLACTOFRN in the last 72 hours.  Studies/Results: No results found.  Medications:  Scheduled: . carvedilol  10 mg Oral Daily  . Chlorhexidine Gluconate Cloth  6 each Topical Daily  . furosemide  20 mg Oral Daily  . insulin aspart  0-15 Units Subcutaneous Q4H  . methocarbamol  500 mg Oral TID  . pantoprazole  40 mg Oral Daily  . sacubitril-valsartan  1 tablet Oral BID   Continuous:   Assessment/Plan: 1) Duodenal AVMs s/p APC. 2) Anemia.   There are no new HGB values, however, I suspect that the 2.7 g/dL on admission was erroneous given the drastic increase in her HGB up to 12.8 g/dL.  There are no reports of melena or  hematochezia.  Plan: 1) Continue to monitor HGB. 2) If there is an overt evidence of bleeding or a continued down trend in her HGB, please call. 3) Signing off.  LOS: 3 days   Shaqueta Casady D 02/13/2019, 3:09 PM

## 2019-02-14 LAB — COMPREHENSIVE METABOLIC PANEL
ALT: 10 U/L (ref 0–44)
AST: 15 U/L (ref 15–41)
Albumin: 2.6 g/dL — ABNORMAL LOW (ref 3.5–5.0)
Alkaline Phosphatase: 90 U/L (ref 38–126)
Anion gap: 10 (ref 5–15)
BUN: 23 mg/dL (ref 8–23)
CO2: 13 mmol/L — ABNORMAL LOW (ref 22–32)
Calcium: 8.7 mg/dL — ABNORMAL LOW (ref 8.9–10.3)
Chloride: 112 mmol/L — ABNORMAL HIGH (ref 98–111)
Creatinine, Ser: 1.02 mg/dL — ABNORMAL HIGH (ref 0.44–1.00)
GFR calc Af Amer: >60 mL/min (ref >60)
GFR calc non Af Amer: 58 mL/min — ABNORMAL LOW (ref >60)
Glucose, Bld: 128 mg/dL — ABNORMAL HIGH (ref 70–99)
Potassium: 3.8 mmol/L (ref 3.5–5.1)
Sodium: 135 mmol/L (ref 135–145)
Total Bilirubin: 0.9 mg/dL (ref 0.3–1.2)
Total Protein: 7.5 g/dL (ref 6.5–8.1)

## 2019-02-14 LAB — CBC WITH DIFFERENTIAL/PLATELET
Abs Immature Granulocytes: 0.09 10*3/uL — ABNORMAL HIGH (ref 0.00–0.07)
Basophils Absolute: 0.1 10*3/uL (ref 0.0–0.1)
Basophils Relative: 1 %
Eosinophils Absolute: 0.9 10*3/uL — ABNORMAL HIGH (ref 0.0–0.5)
Eosinophils Relative: 6 %
HCT: 42.6 % (ref 36.0–46.0)
Hemoglobin: 13 g/dL (ref 12.0–15.0)
Immature Granulocytes: 1 %
Lymphocytes Relative: 14 %
Lymphs Abs: 2.1 10*3/uL (ref 0.7–4.0)
MCH: 24.1 pg — ABNORMAL LOW (ref 26.0–34.0)
MCHC: 30.5 g/dL (ref 30.0–36.0)
MCV: 78.9 fL — ABNORMAL LOW (ref 80.0–100.0)
Monocytes Absolute: 1.2 10*3/uL — ABNORMAL HIGH (ref 0.1–1.0)
Monocytes Relative: 8 %
Neutro Abs: 10.4 10*3/uL — ABNORMAL HIGH (ref 1.7–7.7)
Neutrophils Relative %: 70 %
Platelets: 432 10*3/uL — ABNORMAL HIGH (ref 150–400)
RBC: 5.4 MIL/uL — ABNORMAL HIGH (ref 3.87–5.11)
RDW: 25.7 % — ABNORMAL HIGH (ref 11.5–15.5)
WBC: 14.8 10*3/uL — ABNORMAL HIGH (ref 4.0–10.5)
nRBC: 1.4 % — ABNORMAL HIGH (ref 0.0–0.2)

## 2019-02-14 LAB — GLUCOSE, CAPILLARY
Glucose-Capillary: 142 mg/dL — ABNORMAL HIGH (ref 70–99)
Glucose-Capillary: 131 mg/dL — ABNORMAL HIGH (ref 70–99)
Glucose-Capillary: 166 mg/dL — ABNORMAL HIGH (ref 70–99)
Glucose-Capillary: 158 mg/dL — ABNORMAL HIGH (ref 70–99)
Glucose-Capillary: 173 mg/dL — ABNORMAL HIGH (ref 70–99)

## 2019-02-14 MED ORDER — APIXABAN 2.5 MG PO TABS
2.5000 mg | ORAL_TABLET | Freq: Two times a day (BID) | ORAL | Status: DC
  Administered 2019-02-14 – 2019-02-15 (×3): 2.5 mg via ORAL
  Filled 2019-02-14 (×3): qty 1

## 2019-02-14 NOTE — Progress Notes (Signed)
PROGRESS NOTE  Jocelyn Sanchez [id number] DOB: 12-Jun-1956 DOA: 02/10/2019 PCP: Dixie Dials, MD  Brief History   63 year old African-American female with multiple sclerosis diabetes mellitus type 2 HTN prior pulmonary embolism May 2017 anemia Hospitalized with septic shock 08/16/2017 along with sacral decubitus stage IV C. difficile Prior GI bleed 06/2016 esophagitis, duodenal bulb AV malformation-bleeding source not identified at that time  Admitted by critical care with nausea vomiting melena hemoglobin 2.7-transfused-EGD performed as below Required pressors briefly and ICU setting Transferred to triad hospitalist service 8/27   A & P  Acute upper GI bleed leading to hypovolemic shock Appreciate GI Dr. 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 Stage 3 decubitus ulcer, sacral decubiti Wound observed and examined 8/27 frequent turning Morbid obesity Diastolic heart failure Continue Lasix 20- Coreg 3.125 bid--change to 10 CD long-acting beta-blocker hold Entresto twice daily at this time-will not resume secondary to hyperkalemia earleir in stay Diabetes mellitus type 2 Home Meds glipizide 5 twice daily--CBG as below Hypotension in the setting of prior hypertension See above discussion Chronic leukocytosis Seems to started 07/2017 unclear etiology she is afebrile this will need to be monitored in the outpatient setting and occult source may need to be sought Mixed alkalosis/acidosis  Significant OSA habitus and probably has compensated metabolic acidosis for her respiratory alkalosis-outpatient work-up Prior urinary/pulmonary source of sepsis 2019, Prior C. difficile 2019   DVT prophylaxis: scd Code Status: full Family Communication: none present---called Son Derek--and fully updated on 8/27-expect possible discharge [id number] once we can ascertain if she has no further bleeding Disposition Plan: Discharge 8/29 Verneita Griffes, MD [facility] 11:36 AM  02/14/2019, 11:36 AM  LOS: 4 days   Consultants  . Gastroenterology  Procedures  . EGD as above  Antibiotics  . None  Interval History/Subjective   Did very well tolerating diet no chest pain no fever No chills No vomiting  Objective   Vitals:  Vitals:   02/14/19 0349 02/14/19 0906  BP: 105/66 95/71  Pulse: 96 100  Resp: 16   Temp: 98.4 F (36.9 C)   SpO2: 100% 98%    Exam:  Awake coherent pleasant thick neck moderate dentition Mallampati 4 S1-S2 no murmur Wounds not examined today Abdomen soft obese nontender no rebound no epigastric tenderness cannot appreciate organomegaly No rash Prevalon boots on both feet did not examine heels once again have seen on 8/27    I have personally reviewed the following:   Lab Data  . Chloride 112 BUN/creatinine 23/1.0 . Hyperkalemia is resolved and is now 3.8 . CO2 13 baseline in the 18 range LFTs are normal . WBC is 14 and that appears baseline is anywhere from 14-20 over the past several years . Hemoglobin 13 platelets 432  Micro Data  . None  Imaging  . No  Cardiology Data  . No  Other Data  CBGs ranging from 1 30-1 50 eating 90% of meals  Scheduled Meds: . carvedilol  10 mg Oral Daily  . Chlorhexidine Gluconate Cloth  6 each Topical Daily  . furosemide  20  mg Oral Daily  . insulin aspart  0-15 Units Subcutaneous Q4H  . methocarbamol  500 mg Oral TID  . pantoprazole  40 mg Oral Daily  . sacubitril-valsartan  1 tablet Oral BID    Continuous Infusions:  Active Problems:   GI bleed   LOS: 4 days   How to contact the [facility] Attending or Consulting provider [place] or covering provider during after hours [place], for this patient?  1. Check the care team in [facility] and look for a) attending/consulting TRH provider listed and b) the [facility] team listed 2. Log into [email or web address] and use [place]'s universal password to access. If you do not have the password, please contact the hospital operator. 3. Locate the [facility] provider you are looking for under [facility] and page to a number that you can be directly reached. 4. If you still have difficulty reaching the provider, please page the [facility] (Director on Call) for the Hospitalists listed on amion for assistance.

## 2019-02-14 NOTE — Progress Notes (Signed)
   02/14/19 1200  Clinical Encounter Type  Visited With Patient  Visit Type Initial;Psychological support;Spiritual support  Referral From Nurse  Consult/Referral To Chaplain  Spiritual Encounters  Spiritual Needs Emotional;Other (Comment);[employer] researcher Information )  Stress Factors  Patient Stress Factors None identified  Advance Directives (For Healthcare)  Does Patient Have a Medical Advance Directive? No  Would patient like information on creating a medical advance directive? No - Patient declined  [place]  Does Patient Have a Mental Health Advance Directive? No  Would patient like information on creating a mental health advance directive? No - Patient declined   I visited with Jocelyn Sanchez per spiritual care consult to discuss an Advance Directive. I provided the document to her and provided some education about what the document is.  Jocelyn Sanchez stated that her mother is still living and that she has children and that she trusts them completely to make her healthcare decisions in the event that she is unable to at any point.  Please, contact Spiritual Care for further assistance.   Chaplain Shanon Ace M.Div., [facility]

## 2019-02-14 NOTE — Plan of Care (Signed)

## 2019-02-14 NOTE — Progress Notes (Signed)
OT Cancellation Note  Patient Details Name: Jocelyn Sanchez MRN: [id number] DOB: Apr 22, 1956   Cancelled Treatment:    Reason Eval/Treat Not Completed: Other (comment). Spoke to [employer]. Pt requires total A at baseline, including self feeding and is bed bound. Will sign off.  [name] 02/14/2019, 7:57 AM  Lesle Chris, OTR/L Acute Rehabilitation Services [phone] [facility] pager [phone] office 02/14/2019

## 2019-02-14 NOTE — Progress Notes (Signed)
PT Cancellation Note  Patient Details Name: LYLLA MELNIKOV MRN: [id number] DOB: 20-May-1956   Cancelled Treatment:    Reason Eval/Treat Not Completed: PT screened, no needs identified, will sign off(per OT pt is bedbound and requires total assist at baseline, will sign off.)  [name] PT 02/14/2019  Acute Rehabilitation Services Pager [phone] Office [phone]

## 2019-02-15 LAB — CBC WITH DIFFERENTIAL/PLATELET
Abs Immature Granulocytes: 0.11 10*3/uL — ABNORMAL HIGH (ref 0.00–0.07)
Basophils Absolute: 0.1 10*3/uL (ref 0.0–0.1)
Basophils Relative: 1 %
Eosinophils Absolute: 0.9 10*3/uL — ABNORMAL HIGH (ref 0.0–0.5)
Eosinophils Relative: 6 %
HCT: 37.6 % (ref 36.0–46.0)
Hemoglobin: 11.5 g/dL — ABNORMAL LOW (ref 12.0–15.0)
Immature Granulocytes: 1 %
Lymphocytes Relative: 17 %
Lymphs Abs: 2.5 10*3/uL (ref 0.7–4.0)
MCH: 24.6 pg — ABNORMAL LOW (ref 26.0–34.0)
MCHC: 30.6 g/dL (ref 30.0–36.0)
MCV: 80.5 fL (ref 80.0–100.0)
Monocytes Absolute: 1.3 10*3/uL — ABNORMAL HIGH (ref 0.1–1.0)
Monocytes Relative: 9 %
Neutro Abs: 9.8 10*3/uL — ABNORMAL HIGH (ref 1.7–7.7)
Neutrophils Relative %: 66 %
Platelets: 387 10*3/uL (ref 150–400)
RBC: 4.67 MIL/uL (ref 3.87–5.11)
RDW: 25.9 % — ABNORMAL HIGH (ref 11.5–15.5)
WBC: 14.7 10*3/uL — ABNORMAL HIGH (ref 4.0–10.5)
nRBC: 0.5 % — ABNORMAL HIGH (ref 0.0–0.2)

## 2019-02-15 LAB — GLUCOSE, CAPILLARY
Glucose-Capillary: 104 mg/dL — ABNORMAL HIGH (ref 70–99)
Glucose-Capillary: 122 mg/dL — ABNORMAL HIGH (ref 70–99)
Glucose-Capillary: 159 mg/dL — ABNORMAL HIGH (ref 70–99)
Glucose-Capillary: 222 mg/dL — ABNORMAL HIGH (ref 70–99)

## 2019-02-15 LAB — CULTURE, BLOOD (ROUTINE X 2)
Culture: NO GROWTH
Culture: NO GROWTH
Special Requests: ADEQUATE

## 2019-02-15 MED ORDER — PANTOPRAZOLE SODIUM 40 MG PO TBEC: 40.0000 mg | DELAYED_RELEASE_TABLET | Freq: Every day | ORAL | 0 refills | Status: DC

## 2019-02-15 MED ORDER — CARVEDILOL PHOSPHATE ER 10 MG PO CP24: 10.0000 mg | ORAL_CAPSULE | Freq: Every day | ORAL | 0 refills | Status: DC

## 2019-02-15 NOTE — Discharge Summary (Signed)
Physician Discharge Summary  Jocelyn Sanchez [id number] DOB: 04-21-1956 DOA: 02/10/2019  PCP: Dixie Dials, MD  Admit date: 02/10/2019 Discharge date: 02/15/2019  Time spent: 25 minutes  Recommendations for Outpatient Follow-up:  1. Needs outpatient work-up of leukocytosis--- because of her debility she probably has a chronic inflammatory state without focal source 2. Consider discontinuation of anticoagulation given her pulmonary embolism was in 2017-deferred to PCP who is aware of her discharge 3. Consider labs in 1 week and addition of bicarb if continues to have acidosis which is also subacute 4. addition of Protonix this admission, discontinued Entresto consolidated Coreg 5. Has max care at home and son is able to take care of her  Discharge Diagnoses:  Active Problems:   GI bleed   Discharge Condition: Fair  Diet recommendation: Heart healthy  Filed Weights   02/11/19 0427 02/12/19 0500 02/13/19 0500  Weight: 67.9 kg 67 kg 66.3 kg    History of present illness:  63 year old African-American female with multiple sclerosis diabetes mellitus type 2 HTN prior pulmonary embolism May 2017 anemia Hospitalized with septic shock 08/16/2017 along with sacral decubitus stage IV C. difficile Prior GI bleed 06/2016 esophagitis, duodenal bulb AV malformation-bleeding source not identified at that time  Admitted by critical care with nausea vomiting melena hemoglobin 2.7-transfused-EGD performed as below Required pressors briefly and ICU setting Transferred to [facility] service 8/27  Hospital Course:  Acute upper GI bleed leading to hypovolemic shock Appreciate GI Dr. Benson [place] input-endoscopy 8/25 angiodysplasia-safe to resume 8/28 anticoagulation--resumed -hemoglobin stabilized on day of discharge without any evidence of bleeding so patient able to go home Tolerating soft diet 90% Multiple sclerosis Apparently not on treatment for the past several years but no  neurologist Spoke with family who have complete set up at home and are willing to take her back home--likely d/c 8/29 Can continue Robaxin 500 3 times daily Pulmonary embolism 2017 May need discussion regarding cessation of anticoagulation going forward-previously on apixaban 2.5 twice daily-Dr. Doylene Canard aware of need to address this in the outpatient setting Stage 3 decubitus ulcer, sacral decubiti Wound observed and examined 8/27 frequent turning Son has all equipment including a lift at home and will be able to manage Morbid obesity Diastolic heart failure Continue Lasix 20- Coreg 3.125 bid--change to 10 CD long-acting beta-blocker Entresto discontinued this admission Diabetes mellitus type 2 Home Meds glipizide 5 twice daily-she is not taking --CBG 100s to 150s only needing minimal amount of insulin-discontinue glipizide on discharge Hypotension in the setting of prior hypertension See above discussion Chronic leukocytosis Seems to started 07/2017 unclear etiology she is afebrile this will need to be monitored in the outpatient setting and occult source may need to be sought Mixed alkalosis/acidosis Significant OSA habitus and probably has compensated metabolic acidosis for her respiratory alkalosis-outpatient work-up-consider initiation of BiPAP/CPAP given her habitus and may need a split night study for the same Prior urinary/pulmonary source of sepsis 2019, Prior C. difficile 2019   Discharge Exam: Vitals:   02/14/19 2041 02/15/19 0524  BP: 136/67 (!) 106/54  Pulse: 91 90  Resp: 16 20  Temp: 99 F (37.2 C) 98.9 F (37.2 C)  SpO2: 100% 100%    Awake coherent no distress EOMI NCAT Had [place] toast and a full breakfast this morning No reports of bleeding Chest clear no added sound Abdomen soft no rebound Wounds not examined Carpopedal spasms which are normal for her and at baseline she is unable to raise legs off bed  Discharge Instructions  Discharge Instructions     Diet - low sodium heart healthy   Complete by: As directed    Discharge instructions   Complete by: As directed    Note medication changes to blood pressure--change to Coreg, and stop entresto Need labs in ~ 1 week Dr. Stana Bunting is aware   Increase activity slowly   Complete by: As directed      Allergies as of 02/15/2019      Reactions   Sulfa Antibiotics Shortness Of Breath, Swelling      Medication List    STOP taking these medications   carvedilol 3.125 MG tablet Commonly known as: COREG   glipiZIDE 5 MG tablet Commonly known as: GLUCOTROL   sacubitril-valsartan 24-26 MG Commonly known as: ENTRESTO     TAKE these medications   acetaminophen 500 MG tablet Commonly known as: TYLENOL Take 1,000 mg by mouth every 6 (six) hours as needed for moderate pain.   ALPRAZolam 0.5 MG tablet Commonly known as: XANAX Take 0.5 mg by mouth 2 (two) times daily as needed for anxiety.   apixaban 2.5 MG Tabs tablet Commonly known as: ELIQUIS Take 1 tablet (2.5 mg total) by mouth 2 (two) times daily.   carvedilol 10 MG 24 hr capsule Commonly known as: COREG CR Take 1 capsule (10 mg total) by mouth daily.   famotidine 20 MG tablet Commonly known as: PEPCID Take 1 tablet (20 mg total) by mouth 2 (two) times daily.   furosemide 20 MG tablet Commonly known as: LASIX Take 20 mg by mouth daily.   HYDROcodone-acetaminophen 5-325 MG tablet Commonly known as: NORCO/VICODIN Take 2 tablets by mouth every 4 (four) hours as needed for moderate pain.   methocarbamol 500 MG tablet Commonly known as: ROBAXIN Take 0.5 tablets (250 mg total) by mouth every 8 (eight) hours as needed for muscle spasms.   pantoprazole 40 MG tablet Commonly known as: PROTONIX Take 1 tablet (40 mg total) by mouth daily.      Allergies  Allergen Reactions  . Sulfa Antibiotics Shortness Of Breath and Swelling      The results of significant diagnostics from this hospitalization (including imaging,  microbiology, ancillary and laboratory) are listed below for reference.    Significant Diagnostic Studies: Dg Chest Port 1 View  Result Date: 02/10/2019 CLINICAL DATA:  Shortness of breath EXAM: PORTABLE CHEST 1 VIEW COMPARISON:  August 10, 2017 FINDINGS: No edema or consolidation. Heart size and pulmonary vascularity are normal. No adenopathy. There is aortic atherosclerosis. No bone lesions. IMPRESSION: No edema or consolidation. Heart size normal. Aortic Atherosclerosis (ICD10-I70.0). Electronically Signed   By: Lowella Grip III M.D.   On: 02/10/2019 14:15   Vas [place] Lower Extremity Venous (dvt)  Result Date: 02/11/2019  Lower Venous Study Indications: Edema.  Risk Factors: DVT History of DVT 10/27/2015 patient is normally on chronic anticoagulant which was given for a previous DVT in 2017 which had to be discontinued due to a GI bleed. Limitations: Severe limitation due to contractures and rotation of the legs. Comparison Study: Positive in 2017 Performing Technologist: Toma Copier RVS  Examination Guidelines: A complete evaluation includes B-mode imaging, spectral Doppler, color Doppler, and power Doppler as needed of all accessible portions of each vessel. Bilateral testing is considered an integral part of a complete examination. Limited examinations for reoccurring indications may be performed as noted.  +---------+---------------+---------+-----------+----------+--------------+ RIGHT    CompressibilityPhasicitySpontaneityPropertiesThrombus Aging +---------+---------------+---------+-----------+----------+--------------+ CFV      Full  Yes      Yes                                 +---------+---------------+---------+-----------+----------+--------------+ SFJ      Full                                                        +---------+---------------+---------+-----------+----------+--------------+ FV Prox  Full           Yes      Yes                                  +---------+---------------+---------+-----------+----------+--------------+ FV Mid   Full                                                        +---------+---------------+---------+-----------+----------+--------------+ FV DistalFull           Yes      Yes                                 +---------+---------------+---------+-----------+----------+--------------+ PFV      Full           Yes      Yes                                 +---------+---------------+---------+-----------+----------+--------------+ POP      Full           Yes      Yes                                 +---------+---------------+---------+-----------+----------+--------------+ PTV      Full                                                        +---------+---------------+---------+-----------+----------+--------------+ PERO                                                  Not visualized +---------+---------------+---------+-----------+----------+--------------+   Right Technical Findings: Not visualized segments include Peroneal. All segments of the veins throughout could not be visualized due to contractures and rotation of the leg  +---------+---------------+---------+-----------+----------+-------------------+ LEFT     CompressibilityPhasicitySpontaneityPropertiesThrombus Aging      +---------+---------------+---------+-----------+----------+-------------------+ CFV      Full                                                             +---------+---------------+---------+-----------+----------+-------------------+  SFJ      Full                                                             +---------+---------------+---------+-----------+----------+-------------------+ FV Prox  Full                                                             +---------+---------------+---------+-----------+----------+-------------------+ FV Mid   Full                                                              +---------+---------------+---------+-----------+----------+-------------------+ FV DistalFull                                                             +---------+---------------+---------+-----------+----------+-------------------+ PFV      Full                                                             +---------+---------------+---------+-----------+----------+-------------------+ POP      Full                                                             +---------+---------------+---------+-----------+----------+-------------------+ PTV                                                   Not visualized due                                                        to rotation of the                                                        leg                 +---------+---------------+---------+-----------+----------+-------------------+ PERO  Not visualized due                                                        to rotation of the                                                        lef                 +---------+---------------+---------+-----------+----------+-------------------+  Left Technical Findings: Not visualized segments include peroneal and posterior tibial veins. peroneal and Posterior tibial veins could not be visualized due to rotation of the leg   Summary: Right: There is no obvious evidence of deep vein thrombosis in the lower extremity. However, portions of this examination were limited. No cystic structure found in the popliteal fossa. See technical findings listed above Left: There is no obvious evidence of deep vein thrombosis in the lower extremity. However, portions of this examination were limited. No cystic structure found in the popliteal fossa. See technical findings listed above.  *See table(s) above for measurements and observations.  Electronically signed by Deitra Mayo MD on 02/11/2019 at 1:24:18 PM.    Final     Microbiology: Recent Results (from the past 240 hour(s))  Culture, blood (routine x 2)     Status: None (Preliminary result)   Collection Time: 02/10/19  1:24 PM   Specimen: BLOOD  Result Value Ref Range Status   Specimen Description   Final    BLOOD RIGHT ANTECUBITAL Performed at [facility], [place] [address] [place], [place] [address]    Special Requests   Final    BOTTLES DRAWN AEROBIC AND ANAEROBIC Blood Culture adequate volume Performed at [place] [address], [place], [place] [address]    Culture   Final    NO GROWTH 4 DAYS Performed at [facility], [place] [address] [place], [place] [address]    Report Status PENDING  Incomplete  SARS Coronavirus 2 [facility] order, Performed in [facility] hospital lab) Nasopharyngeal Nasopharyngeal Swab     Status: None   Collection Time: 02/10/19  1:25 PM   Specimen: Nasopharyngeal Swab  Result Value Ref Range Status   SARS Coronavirus 2 NEGATIVE NEGATIVE Final    Comment: (NOTE) If result is NEGATIVE SARS-CoV-2 target nucleic acids are NOT DETECTED. The SARS-CoV-2 RNA is generally detectable in upper and lower  respiratory specimens during the acute phase of infection. The lowest  concentration of SARS-CoV-2 viral copies this assay can detect is 250  copies / mL. A negative result does not preclude SARS-CoV-2 infection  and should not be used as the sole basis for treatment or other  patient management decisions.  A negative result may occur with  improper specimen collection / handling, submission of specimen other  than nasopharyngeal swab, presence of viral mutation(s) within the  areas targeted by this assay, and inadequate number of viral copies  (<250 copies / mL). A negative result must be combined with clinical  observations, patient history, and epidemiological information. If result  is POSITIVE SARS-CoV-2 target nucleic acids are DETECTED. The SARS-CoV-2 RNA is generally detectable in  upper and lower  respiratory specimens dur ing the acute phase of infection.  Positive  results are indicative of active infection with SARS-CoV-2.  Clinical  correlation with patient history and other diagnostic information is  necessary to determine patient infection status.  Positive results do  not rule out bacterial infection or co-infection with other viruses. If result is PRESUMPTIVE POSTIVE SARS-CoV-2 nucleic acids MAY BE PRESENT.   A presumptive positive result was obtained on the submitted specimen  and confirmed on repeat testing.  While 2019 novel coronavirus  (SARS-CoV-2) nucleic acids may be present in the submitted sample  additional confirmatory testing may be necessary for epidemiological  and / or clinical management purposes  to differentiate between  SARS-CoV-2 and other Sarbecovirus currently known to infect humans.  If clinically indicated additional testing with an alternate test  methodology [phone]) is advised. The SARS-CoV-2 RNA is generally  detectable in upper and lower respiratory sp ecimens during the acute  phase of infection. The expected result is Negative. Fact Sheet for Patients:  StrictlyIdeas.no Fact Sheet for Healthcare Providers: BankingDealers.co.za This test is not yet approved or cleared by the [place] FDA and has been authorized for detection and/or diagnosis of SARS-CoV-2 by FDA under an Emergency Use Authorization (EUA).  This EUA will remain in effect (meaning this test can be used) for the duration of the COVID-19 declaration under Section 564(b)(1) of the Act, 21 U.S.C. section 360bbb-3(b)(1), unless the authorization is terminated or revoked sooner. Performed at [facility], [place] [address], [place], [place] [address]   Culture, blood (routine x 2)     Status:  None (Preliminary result)   Collection Time: 02/10/19  1:29 PM   Specimen: BLOOD RIGHT FOREARM  Result Value Ref Range Status   Specimen Description BLOOD RIGHT FOREARM  Final   Special Requests   Final    BOTTLES DRAWN AEROBIC AND ANAEROBIC Blood Culture results may not be optimal due to an inadequate volume of blood received in culture bottles   Culture   Final    NO GROWTH 4 DAYS Performed at [facility], [place] [address], [place], [place] [address]    Report Status PENDING  Incomplete  MRSA PCR Screening     Status: None   Collection Time: 02/12/19  4:30 AM   Specimen: Nasal Mucosa; Nasopharyngeal  Result Value Ref Range Status   MRSA by PCR NEGATIVE NEGATIVE Final    Comment:        The GeneXpert MRSA Assay (FDA approved for NASAL specimens only), is one component of a comprehensive MRSA colonization surveillance program. It is not intended to diagnose MRSA infection nor to guide or monitor treatment for MRSA infections. Performed at [facility], [place] [address] [place], [place] [address]      Labs: Basic Metabolic Panel: Recent Labs  Lab 02/10/19 1449 02/11/19 0203 02/14/19 0229  NA 131* 135 135  K 4.3 5.2* 3.8  CL 110 113* 112*  CO2 12* 13* 13*  GLUCOSE 135* 100* 128*  BUN 68* 48* 23  CREATININE 2.24* 1.61* 1.02*  CALCIUM 7.8* 8.0* 8.7*  PHOS  --  6.0*  --    Liver Function Tests: Recent Labs  Lab 02/10/19 1449 02/14/19 0229  AST 8* 15  ALT 7 10  ALKPHOS 81 90  BILITOT 0.7 0.9  PROT 6.1* 7.5  ALBUMIN 2.3* 2.6*   Recent Labs  Lab 02/10/19 1449  LIPASE 999*   No results for  input(s): AMMONIA in the last 168 hours. CBC: Recent Labs  Lab 02/10/19 1449 02/11/19 0048 02/14/19 0229 02/15/19 0242  WBC 16.4* 17.0* 14.8* 14.7*  NEUTROABS 13.8* 15.1* 10.4* 9.8*  HGB 2.7* 12.8 13.0 11.5*  HCT 10.6* 40.9 42.6 37.6  MCV 74.1* 79.7* 78.9* 80.5  PLT 771* 534* 432* 387   Cardiac Enzymes: No results for input(s): CKTOTAL,  CKMB, CKMBINDEX, TROPONINI in the last 168 hours. BNP: BNP (last 3 results) No results for input(s): BNP in the last 8760 hours.  ProBNP (last 3 results) No results for input(s): PROBNP in the last 8760 hours.  CBG: Recent Labs  Lab 02/14/19 1557 02/14/19 2038 02/15/19 0012 02/15/19 0322 02/15/19 0729  GLUCAP 158* 173* 159* 122* 104*       Signed:  Nita Sells MD   [facility] 02/15/2019, 9:06 AM

## 2019-02-15 NOTE — TOC Initial Note (Signed)
Transition of Care [facility]) - Initial/Assessment Note    Patient Details  Name: Jocelyn Sanchez MRN: [id number] Date of Birth: 03/25/1956  Transition of Care [facility]) CM/SW Contact:    Joaquin Courts, RN Phone Number: 02/15/2019, 11:29 AM  Clinical Narrative:      Patient to discharge home today. Patient to transport vis [facility]. Paperwork given to Agricultural consultant.              Expected Discharge Plan: Home/Self Care Barriers to Discharge: No Barriers Identified   Patient Goals and CMS Choice Patient states their goals for this hospitalization and ongoing recovery are:: to go home      Expected Discharge Plan and Services Expected Discharge Plan: Home/Self Care   Discharge Planning Services: CM Consult     Expected Discharge Date: 02/15/19               DME Arranged: N/A DME Agency: NA       HH Arranged: NA HH Agency: NA        Prior Living Arrangements/Services   Lives with:: Parents Patient language and need for interpreter reviewed:: Yes Do you feel safe going back to the place where you live?: Yes      Need for Family Participation in Patient Care: Yes (Comment) Care giver support system in place?: Yes (comment)   Criminal Activity/Legal Involvement Pertinent to Current Situation/Hospitalization: No - Comment as needed  Activities of Daily Living Home Assistive Devices/Equipment: [employer] fast streamer, CBG Meter, Wheelchair, Dentures (specify type)(upper denture) ADL Screening (condition at time of admission) Patient's cognitive ability adequate to safely complete daily activities?: No(responding slowy but alert) Is the patient deaf or have difficulty hearing?: No Does the patient have difficulty seeing, even when wearing glasses/contacts?: No Does the patient have difficulty concentrating, remembering, or making decisions?: No Patient able to express need for assistance with ADLs?: Yes Does the patient have difficulty dressing or bathing?: Yes Independently performs  ADLs?: No Communication: Independent Dressing (OT): Dependent Is this a change from baseline?: Pre-admission baseline Grooming: Dependent Is this a change from baseline?: Pre-admission baseline Feeding: Needs assistance(can feed herself if it is finger foods) Is this a change from baseline?: Pre-admission baseline Bathing: Dependent Is this a change from baseline?: Pre-admission baseline Toileting: Dependent Is this a change from baseline?: Pre-admission baseline In/Out Bed: Dependent Is this a change from baseline?: Pre-admission baseline Walks in Home: Dependent Is this a change from baseline?: Pre-admission baseline Does the patient have difficulty walking or climbing stairs?: Yes Weakness of Legs: Both Weakness of Arms/Hands: Both  Permission Sought/Granted                  Emotional Assessment              Admission diagnosis:  Gastrointestinal hemorrhage, unspecified gastrointestinal hemorrhage type [K92.2] Anemia, unspecified type [D64.9] Patient Active Problem List   Diagnosis Date Noted  . GI bleed 02/10/2019  . Proteus mirabilis infection 08/14/2017  . Diarrhea 08/14/2017  . Bacteremia 08/14/2017  . Gastric and duodenal angiodysplasia   . Esophageal stricture   . Gastroesophageal reflux disease with esophagitis   . Hx of pulmonary embolus 10/26/2015  . PVD (peripheral vascular disease) ([facility]) 07/04/2015  . Essential hypertension, benign 06/17/2015  . Decubitus ulcer of ankle, stage 4 ([place]) 05/10/2015  . Fever 03/08/2015  . Diabetes mellitus with peripheral vascular disease ([place]) 03/06/2015  . Leukocytosis 03/06/2015  . Anemia of chronic disease 03/06/2015  . Decubitus ulcer of sacral region, stage 3 (  [place]) 03/06/2015  . Lymphedema 03/06/2015  . Multiple sclerosis ([place]) 05/11/2011   PCP:  Dixie Dials, MD Pharmacy:   [id number] - [place], [place] [id number] [place]  [place] [address] Phone: [phone] Fax: [phone]     Social Determinants of Health (SDOH) Interventions    Readmission Risk Interventions No flowsheet data found.

## 2019-02-15 NOTE — Progress Notes (Signed)
[facility] notified of need for transport back to home; no ETA given

## 2019-03-27 ENCOUNTER — Encounter (HOSPITAL_COMMUNITY): Payer: Self-pay | Admitting: Emergency Medicine

## 2019-03-27 ENCOUNTER — Emergency Department (HOSPITAL_COMMUNITY): Payer: Medicare Other

## 2019-03-27 ENCOUNTER — Inpatient Hospital Stay (HOSPITAL_COMMUNITY)
Admission: EM | Admit: 2019-03-27 | Discharge: 2019-03-31 | DRG: 871 | Disposition: A | Discharge status: Home / Self Care | Payer: Medicare Other | Attending: Internal Medicine | Admitting: Internal Medicine

## 2019-03-27 ENCOUNTER — Other Ambulatory Visit: Payer: Self-pay

## 2019-03-27 DIAGNOSIS — E872 Acidosis, unspecified: Secondary | ICD-10-CM

## 2019-03-27 DIAGNOSIS — R571 Hypovolemic shock: Secondary | ICD-10-CM | POA: Diagnosis present

## 2019-03-27 DIAGNOSIS — R319 Hematuria, unspecified: Secondary | ICD-10-CM

## 2019-03-27 DIAGNOSIS — D72829 Elevated white blood cell count, unspecified: Secondary | ICD-10-CM

## 2019-03-27 DIAGNOSIS — E876 Hypokalemia: Secondary | ICD-10-CM | POA: Diagnosis not present

## 2019-03-27 DIAGNOSIS — E871 Hypo-osmolality and hyponatremia: Secondary | ICD-10-CM

## 2019-03-27 DIAGNOSIS — K5900 Constipation, unspecified: Secondary | ICD-10-CM | POA: Diagnosis not present

## 2019-03-27 DIAGNOSIS — D638 Anemia in other chronic diseases classified elsewhere: Secondary | ICD-10-CM | POA: Diagnosis present

## 2019-03-27 DIAGNOSIS — Z87891 Personal history of nicotine dependence: Secondary | ICD-10-CM | POA: Diagnosis not present

## 2019-03-27 DIAGNOSIS — Z820 Family history of epilepsy and other diseases of the nervous system: Secondary | ICD-10-CM

## 2019-03-27 DIAGNOSIS — E875 Hyperkalemia: Secondary | ICD-10-CM

## 2019-03-27 DIAGNOSIS — Z882 Allergy status to sulfonamides status: Secondary | ICD-10-CM

## 2019-03-27 DIAGNOSIS — I10 Essential (primary) hypertension: Secondary | ICD-10-CM | POA: Diagnosis present

## 2019-03-27 DIAGNOSIS — Z66 Do not resuscitate: Secondary | ICD-10-CM | POA: Diagnosis present

## 2019-03-27 DIAGNOSIS — Z86718 Personal history of other venous thrombosis and embolism: Secondary | ICD-10-CM

## 2019-03-27 DIAGNOSIS — Z7901 Long term (current) use of anticoagulants: Secondary | ICD-10-CM | POA: Diagnosis not present

## 2019-03-27 DIAGNOSIS — Z79891 Long term (current) use of opiate analgesic: Secondary | ICD-10-CM | POA: Diagnosis not present

## 2019-03-27 DIAGNOSIS — Z20828 Contact with and (suspected) exposure to other viral communicable diseases: Secondary | ICD-10-CM | POA: Diagnosis present

## 2019-03-27 DIAGNOSIS — R6520 Severe sepsis without septic shock: Secondary | ICD-10-CM | POA: Diagnosis not present

## 2019-03-27 DIAGNOSIS — A419 Sepsis, unspecified organism: Secondary | ICD-10-CM | POA: Diagnosis not present

## 2019-03-27 DIAGNOSIS — Z23 Encounter for immunization: Secondary | ICD-10-CM | POA: Diagnosis present

## 2019-03-27 DIAGNOSIS — G35 Multiple sclerosis: Secondary | ICD-10-CM | POA: Diagnosis present

## 2019-03-27 DIAGNOSIS — Z86711 Personal history of pulmonary embolism: Secondary | ICD-10-CM | POA: Diagnosis not present

## 2019-03-27 DIAGNOSIS — R6521 Severe sepsis with septic shock: Secondary | ICD-10-CM | POA: Diagnosis not present

## 2019-03-27 DIAGNOSIS — Z79899 Other long term (current) drug therapy: Secondary | ICD-10-CM | POA: Diagnosis not present

## 2019-03-27 DIAGNOSIS — N179 Acute kidney failure, unspecified: Secondary | ICD-10-CM | POA: Diagnosis not present

## 2019-03-27 DIAGNOSIS — E1110 Type 2 diabetes mellitus with ketoacidosis without coma: Secondary | ICD-10-CM | POA: Diagnosis not present

## 2019-03-27 DIAGNOSIS — N390 Urinary tract infection, site not specified: Secondary | ICD-10-CM | POA: Diagnosis present

## 2019-03-27 DIAGNOSIS — L89153 Pressure ulcer of sacral region, stage 3: Secondary | ICD-10-CM

## 2019-03-27 DIAGNOSIS — N170 Acute kidney failure with tubular necrosis: Secondary | ICD-10-CM

## 2019-03-27 DIAGNOSIS — Z452 Encounter for adjustment and management of vascular access device: Secondary | ICD-10-CM

## 2019-03-27 LAB — URINALYSIS, ROUTINE W REFLEX MICROSCOPIC
Bilirubin Urine: NEGATIVE
Glucose, UA: NEGATIVE mg/dL
Ketones, ur: 5 mg/dL — AB
Nitrite: NEGATIVE
Protein, ur: >=300 mg/dL — AB
RBC / HPF: >50 RBC/hpf — ABNORMAL HIGH (ref 0–5)
Specific Gravity, Urine: 1.013 (ref 1.005–1.030)
WBC, UA: >50 WBC/hpf — ABNORMAL HIGH (ref 0–5)
pH: 6 (ref 5.0–8.0)

## 2019-03-27 LAB — COMPREHENSIVE METABOLIC PANEL
ALT: 9 U/L (ref 0–44)
AST: 9 U/L — ABNORMAL LOW (ref 15–41)
Albumin: 2.7 g/dL — ABNORMAL LOW (ref 3.5–5.0)
Alkaline Phosphatase: 100 U/L (ref 38–126)
Anion gap: 14 (ref 5–15)
BUN: 68 mg/dL — ABNORMAL HIGH (ref 8–23)
CO2: 11 mmol/L — ABNORMAL LOW (ref 22–32)
Calcium: 9 mg/dL (ref 8.9–10.3)
Chloride: 99 mmol/L (ref 98–111)
Creatinine, Ser: 2.88 mg/dL — ABNORMAL HIGH (ref 0.44–1.00)
GFR calc Af Amer: 19 mL/min — ABNORMAL LOW (ref >60)
GFR calc non Af Amer: 17 mL/min — ABNORMAL LOW (ref >60)
Glucose, Bld: 133 mg/dL — ABNORMAL HIGH (ref 70–99)
Potassium: 5.3 mmol/L — ABNORMAL HIGH (ref 3.5–5.1)
Sodium: 124 mmol/L — ABNORMAL LOW (ref 135–145)
Total Bilirubin: 0.7 mg/dL (ref 0.3–1.2)
Total Protein: 8.6 g/dL — ABNORMAL HIGH (ref 6.5–8.1)

## 2019-03-27 LAB — CBC WITH DIFFERENTIAL/PLATELET
Abs Immature Granulocytes: 0.18 10*3/uL — ABNORMAL HIGH (ref 0.00–0.07)
Basophils Absolute: 0.1 10*3/uL (ref 0.0–0.1)
Basophils Relative: 1 %
Eosinophils Absolute: 0.4 10*3/uL (ref 0.0–0.5)
Eosinophils Relative: 3 %
HCT: 28.5 % — ABNORMAL LOW (ref 36.0–46.0)
Hemoglobin: 8.7 g/dL — ABNORMAL LOW (ref 12.0–15.0)
Immature Granulocytes: 1 %
Lymphocytes Relative: 15 %
Lymphs Abs: 2.4 10*3/uL (ref 0.7–4.0)
MCH: 24.6 pg — ABNORMAL LOW (ref 26.0–34.0)
MCHC: 30.5 g/dL (ref 30.0–36.0)
MCV: 80.5 fL (ref 80.0–100.0)
Monocytes Absolute: 1.6 10*3/uL — ABNORMAL HIGH (ref 0.1–1.0)
Monocytes Relative: 10 %
Neutro Abs: 11.4 10*3/uL — ABNORMAL HIGH (ref 1.7–7.7)
Neutrophils Relative %: 70 %
Platelets: 576 10*3/uL — ABNORMAL HIGH (ref 150–400)
RBC: 3.54 MIL/uL — ABNORMAL LOW (ref 3.87–5.11)
RDW: 25 % — ABNORMAL HIGH (ref 11.5–15.5)
WBC: 16.1 10*3/uL — ABNORMAL HIGH (ref 4.0–10.5)
nRBC: 0 % (ref 0.0–0.2)

## 2019-03-27 LAB — SARS CORONAVIRUS 2 BY RT PCR (HOSPITAL ORDER, PERFORMED IN ~~LOC~~ HOSPITAL LAB): SARS Coronavirus 2: NEGATIVE

## 2019-03-27 LAB — CORTISOL: Cortisol, Plasma: 20 ug/dL

## 2019-03-27 LAB — LACTIC ACID, PLASMA: Lactic Acid, Venous: 0.9 mmol/L (ref 0.5–1.9)

## 2019-03-27 MED ORDER — SODIUM CHLORIDE 0.9 % IV BOLUS
1000.0000 mL | Freq: Once | INTRAVENOUS | Status: AC
  Administered 2019-03-27: 1000 mL via INTRAVENOUS

## 2019-03-27 MED ORDER — ACETAMINOPHEN 500 MG PO TABS
1000.0000 mg | ORAL_TABLET | Freq: Four times a day (QID) | ORAL | Status: DC | PRN
  Administered 2019-03-27 – 2019-03-31 (×6): 1000 mg via ORAL
  Filled 2019-03-27 (×6): qty 2

## 2019-03-27 MED ORDER — PIPERACILLIN-TAZOBACTAM 3.375 G IVPB 30 MIN
3.3750 g | Freq: Once | INTRAVENOUS | Status: AC
  Administered 2019-03-27: 3.375 g via INTRAVENOUS
  Filled 2019-03-27: qty 50

## 2019-03-27 MED ORDER — VANCOMYCIN HCL IN DEXTROSE 750-5 MG/150ML-% IV SOLN: 750.0000 mg | INTRAVENOUS | Status: DC

## 2019-03-27 MED ORDER — LACTATED RINGERS IV SOLN
INTRAVENOUS | Status: DC
  Administered 2019-03-28 – 2019-03-31 (×4): via INTRAVENOUS

## 2019-03-27 MED ORDER — SODIUM CHLORIDE 0.9 % IV SOLN
2.0000 g | INTRAVENOUS | Status: DC
  Administered 2019-03-27: 2 g via INTRAVENOUS
  Filled 2019-03-27: qty 2

## 2019-03-27 MED ORDER — NOREPINEPHRINE 4 MG/250ML-% IV SOLN
0.0000 ug/min | INTRAVENOUS | Status: DC
  Administered 2019-03-28: 6 ug/min via INTRAVENOUS
  Administered 2019-03-28: 2 ug/min via INTRAVENOUS
  Filled 2019-03-27 (×2): qty 250

## 2019-03-27 MED ORDER — FAMOTIDINE 20 MG PO TABS
20.0000 mg | ORAL_TABLET | Freq: Two times a day (BID) | ORAL | Status: DC
  Administered 2019-03-27 – 2019-03-31 (×8): 20 mg via ORAL
  Filled 2019-03-27 (×8): qty 1

## 2019-03-27 MED ORDER — APIXABAN 5 MG PO TABS
5.0000 mg | ORAL_TABLET | Freq: Two times a day (BID) | ORAL | Status: DC
  Administered 2019-03-28 – 2019-03-31 (×8): 5 mg via ORAL
  Filled 2019-03-27 (×8): qty 1

## 2019-03-27 MED ORDER — VANCOMYCIN HCL 10 G IV SOLR
1250.0000 mg | Freq: Once | INTRAVENOUS | Status: AC
  Administered 2019-03-27: 1250 mg via INTRAVENOUS
  Filled 2019-03-27: qty 1250

## 2019-03-27 MED ORDER — ACETAMINOPHEN 325 MG PO TABS: 650.0000 mg | ORAL_TABLET | Freq: Four times a day (QID) | ORAL | Status: DC | PRN

## 2019-03-27 MED ORDER — PANTOPRAZOLE SODIUM 40 MG PO TBEC
40.0000 mg | DELAYED_RELEASE_TABLET | Freq: Every day | ORAL | Status: DC
  Administered 2019-03-27 – 2019-03-31 (×5): 40 mg via ORAL
  Filled 2019-03-27 (×5): qty 1

## 2019-03-27 NOTE — ED Provider Notes (Addendum)
[facility] DEPT Provider Note   CSN: [id number] Arrival date & time: 03/27/19  1051     History   Chief Complaint Chief Complaint  Patient presents with   Pressure Ulcer   Hematuria    HPI Jocelyn Sanchez is a 63 y.o. female.     HPI   63 year old female with a history of diabetes, hypertension, multiple sclerosis, pulmonary embolus on Eliquis, history of GI bleed in August with admission, prior history of sepsis, presents with concern for hematuria for 1 week, and opening of sacral ulcer.  Patient reports that she has had blood in her urine over the last week.  Reports that the urine appears to be pink in color, has been coming and going over the last week.  She also notes that her sacral wound opened approximately a week ago.  Does not have any increased pain in that area.  She denies any fevers, dysuria, diarrhea, constipation, abdominal pain, cough, shortness of breath, chest pain, black or bloody stools.  She does report decreased p.o. intake over the last couple days, and a few episodes of vomiting per day for the last few days.  She denies any changes of medication.  Past Medical History:  Diagnosis Date   Anemia    DM II (diabetes mellitus, type II), controlled ([place])    Gait disorder    HTN (hypertension)    Lymphedema    MS (multiple sclerosis) ([place])    Pulmonary embolism ([place])     Patient Active Problem List   Diagnosis Date Noted   GI bleed 02/10/2019   Proteus mirabilis infection 08/14/2017   Diarrhea 08/14/2017   Bacteremia 08/14/2017   Gastric and duodenal angiodysplasia    Esophageal stricture    Gastroesophageal reflux disease with esophagitis    Hx of pulmonary embolus 10/26/2015   PVD (peripheral vascular disease) ([place]) 07/04/2015   Essential hypertension, benign 06/17/2015   Decubitus ulcer of ankle, stage 4 ([place]) 05/10/2015   Fever 03/08/2015   Diabetes mellitus with peripheral vascular  disease ([place]) 03/06/2015   Leukocytosis 03/06/2015   Anemia of chronic disease 03/06/2015   Decubitus ulcer of sacral region, stage 3 ([place]) 03/06/2015   Lymphedema 03/06/2015   Multiple sclerosis ([place]) 05/11/2011    Past Surgical History:  Procedure Laterality Date   ESOPHAGOGASTRODUODENOSCOPY N/A 07/12/2016   Procedure: ESOPHAGOGASTRODUODENOSCOPY (EGD);  Surgeon: Irene Shipper, MD;  Location: [facility];  Service: Endoscopy;  Laterality: N/A;   ESOPHAGOGASTRODUODENOSCOPY N/A 02/11/2019   Procedure: ESOPHAGOGASTRODUODENOSCOPY (EGD);  Surgeon: Carol Ada, MD;  Location: [facility];  Service: Endoscopy;  Laterality: N/A;   FLEXIBLE SIGMOIDOSCOPY Left 03/09/2015   Procedure: FLEXIBLE SIGMOIDOSCOPY;  Surgeon: Carol Ada, MD;  Location: [facility] END[facility]COPY;  Service: Endoscopy;  Laterality: Left;   HOT HEMOSTASIS N/A 02/11/2019   Procedure: HOT HEMOSTASIS (ARGON PLASMA COAGULATION/BICAP);  Surgeon: Carol Ada, MD;  Location: [facility];  Service: Endoscopy;  Laterality: N/A;     OB History    Gravida  3   Para  3   Term  0   Preterm  0   AB  0   Living  0     SAB  0   TAB  0   Ectopic  0   Multiple  0   Live Births              Home Medications    Prior to Admission medications   Medication Sig Start Date End Date Taking? Authorizing Provider  acetaminophen (  TYLENOL) 500 MG tablet Take 1,000 mg by mouth every 6 (six) hours as needed for moderate pain.    [provider]  ALPRAZolam Duanne Moron) 0.5 MG tablet Take 0.5 mg by mouth 2 (two) times daily as needed for anxiety.    [provider]  apixaban (ELIQUIS) 2.5 MG TABS tablet Take 1 tablet (2.5 mg total) by mouth 2 (two) times daily. 07/14/16   Dixie Dials, MD  carvedilol (COREG CR) 10 MG 24 hr capsule Take 1 capsule (10 mg total) by mouth daily. 02/15/19   Nita Sells, MD  famotidine (PEPCID) 20 MG tablet Take 1 tablet (20 mg total) by mouth 2 (two) times daily. 08/16/17 02/10/19   Arrien, Jimmy Picket, MD  furosemide (LASIX) 20 MG tablet Take 20 mg by mouth daily.     [provider]  HYDROcodone-acetaminophen (NORCO/VICODIN) 5-325 MG per tablet Take 2 tablets by mouth every 4 (four) hours as needed for moderate pain. 03/12/15   Robbie Lis, MD  methocarbamol (ROBAXIN) 500 MG tablet Take 0.5 tablets (250 mg total) by mouth every 8 (eight) hours as needed for muscle spasms. 03/12/15   Robbie Lis, MD  pantoprazole (PROTONIX) 40 MG tablet Take 1 tablet (40 mg total) by mouth daily. 02/15/19   Nita Sells, MD    Family History Family History  Problem Relation Age of Onset   Multiple sclerosis Mother     Social History Social History   Tobacco Use   Smoking status: Former Smoker    Types: Cigarettes   Smokeless tobacco: Never Used  Substance Use Topics   Alcohol use: No   Drug use: No     Allergies   Sulfa antibiotics   Review of Systems Review of Systems  Constitutional: Negative for fever.  Respiratory: Negative for cough and shortness of breath.   Cardiovascular: Negative for chest pain.  Gastrointestinal: Positive for nausea and vomiting. Negative for abdominal pain, blood in stool, constipation and diarrhea.  Genitourinary: Positive for hematuria. Negative for dysuria.  Musculoskeletal: Negative for back pain.  Skin: Positive for wound (sacral wound chronic but opened up). Negative for rash.  Neurological: Negative for weakness (no new neurologic symptoms fro mbaseline MS), numbness and headaches.     Physical Exam Updated Vital Signs BP (!) 84/33    Pulse 85    Temp 98 F (36.7 C) (Oral)    Resp 17    SpO2 100%   Physical Exam Vitals signs and nursing note reviewed.  Constitutional:      General: She is not in acute distress.    Appearance: She is well-developed. She is not diaphoretic.  HENT:     Head: Normocephalic and atraumatic.  Eyes:     Conjunctiva/sclera: Conjunctivae normal.  Neck:      Musculoskeletal: Normal range of motion.  Cardiovascular:     Rate and Rhythm: Normal rate and regular rhythm.     Heart sounds: Normal heart sounds. No murmur. No friction rub. No gallop.   Pulmonary:     Effort: Pulmonary effort is normal. No respiratory distress.     Breath sounds: Normal breath sounds. No wheezing or rales.  Abdominal:     General: There is no distension.     Palpations: Abdomen is soft.     Tenderness: There is no abdominal tenderness. There is no guarding.  Musculoskeletal:        General: No tenderness.  Skin:    General: Skin is warm and dry.  Findings: No erythema or rash.     Comments: Sacral pressure ulcer, open, no surrounding erythema, no tenderness, no drainage   Neurological:     Mental Status: She is alert and oriented to person, place, and time.     GCS: GCS eye subscore is 4. GCS verbal subscore is 5. GCS motor subscore is 6.     Motor: Weakness (baseline all extremities, mild contractures/spasticity UE) present.      ED Treatments / Results  Labs (all labs ordered are listed, but only abnormal results are displayed) Labs Reviewed  CBC WITH DIFFERENTIAL/PLATELET - Abnormal; Notable for the following components:      Result Value   WBC 16.1 (*)    RBC 3.54 (*)    Hemoglobin 8.7 (*)    HCT 28.5 (*)    MCH 24.6 (*)    RDW 25.0 (*)    Platelets 576 (*)    Neutro Abs 11.4 (*)    Monocytes Absolute 1.6 (*)    Abs Immature Granulocytes 0.18 (*)    All other components within normal limits  COMPREHENSIVE METABOLIC PANEL - Abnormal; Notable for the following components:   Sodium 124 (*)    Potassium 5.3 (*)    CO2 11 (*)    Glucose, Bld 133 (*)    BUN 68 (*)    Creatinine, Ser 2.88 (*)    Total Protein 8.6 (*)    Albumin 2.7 (*)    AST 9 (*)    GFR calc non Af Amer 17 (*)    GFR calc Af Amer 19 (*)    All other components within normal limits  URINALYSIS, ROUTINE W REFLEX MICROSCOPIC - Abnormal; Notable for the following components:    Color, Urine AMBER (*)    APPearance TURBID (*)    Hgb urine dipstick LARGE (*)    Ketones, ur 5 (*)    Protein, ur >=300 (*)    Leukocytes,Ua MODERATE (*)    RBC / HPF >50 (*)    WBC, UA >50 (*)    Bacteria, UA MANY (*)    All other components within normal limits  SARS CORONAVIRUS 2 (HOSPITAL ORDER, [facility])  CULTURE, BLOOD (ROUTINE X 2)  CULTURE, BLOOD (ROUTINE X 2)  URINE CULTURE  LACTIC ACID, PLASMA    EKG EKG Interpretation  Date/Time:  Thursday March 27 2019 12:24:11 EDT Ventricular Rate:  91 PR Interval:    QRS Duration: 91 QT Interval:  347 QTC Calculation: 427 R Axis:   -49 Text Interpretation:  Sinus rhythm Left anterior fascicular block Abnormal R-wave progression, early transition Probable LVH with secondary repol abnrm No significant change since last tracing Confirmed by Wandra Arthurs [phone]) on 03/27/2019 3:56:49 PM   Radiology Dg Chest Portable 1 View  Result Date: 03/27/2019 CLINICAL DATA:  Hypotension EXAM: PORTABLE CHEST 1 VIEW COMPARISON:  02/10/2019 FINDINGS: The heart size and mediastinal contours are stable. Calcific aortic knob. Both lungs are clear. The visualized skeletal structures are unremarkable. IMPRESSION: No active disease. Electronically Signed   By: Davina Poke M.D.   On: 03/27/2019 13:51    Procedures .Critical Care Performed by: Gareth Morgan, MD Authorized by: Gareth Morgan, MD   Critical care provider statement:    Critical care time (minutes):  45   Critical care was necessary to treat or prevent imminent or life-threatening deterioration of the following conditions:  Sepsis   Critical care was time spent personally by me on the  following activities:  Review of old charts, examination of patient, evaluation of patient's response to treatment, discussions with consultants, pulse oximetry, ordering and review of radiographic studies, ordering and review of laboratory studies, ordering and  performing treatments and interventions and obtaining history from patient or surrogate   (including critical care time)  Medications Ordered in ED Medications  vancomycin (VANCOCIN) 1,250 mg in sodium chloride 0.9 % 250 mL IVPB (has no administration in time range)  sodium chloride 0.9 % bolus 1,000 mL (0 mLs Intravenous Stopped 03/27/19 1448)  sodium chloride 0.9 % bolus 1,000 mL (1,000 mLs Intravenous New Bag/Given 03/27/19 1601)  sodium chloride 0.9 % bolus 1,000 mL (1,000 mLs Intravenous New Bag/Given 03/27/19 1601)  piperacillin-tazobactam (ZOSYN) IVPB 3.375 g (0 g Intravenous Stopped 03/27/19 1448)  sodium chloride 0.9 % bolus 1,000 mL (0 mLs Intravenous Stopped 03/27/19 1600)     Initial Impression / Assessment and Plan / ED Course  I have reviewed the triage vital signs and the nursing notes.  Pertinent labs & imaging results that were available during my care of the patient were reviewed by me and considered in my medical decision making (see chart for details).        63 year old female with a history of diabetes, hypertension, multiple sclerosis, pulmonary embolus on Eliquis, history of GI bleed in August with admission, prior history of sepsis, presents with concern for hematuria for 1 week, and opening of sacral ulcer.  She is found to be afebrile, hypotensive on arrival to the emergency department.  EMS had told nursing that she has chronic low blood pressures, and patient reported that she also believes that her blood pressures were low at baseline, however on further chart review, it appears that she has had low blood pressures prior to her admissions for sepsis and GI bleed, but they were normal at time of discharge.  In addition, she has a reported history of hypertension and is on antihypertensive medications.  Differential diagnosis for hypotension on arrival include dehydration, sepsis, autonomic dysregulation, adrenal crisis, medication related in setting of AKI. No CP,  doubt PE or cardiac etiology. Denies GI bleed symptoms.  No significant hematuria to account for hypotension.   Ordered greater than 30cc/kg fluids and empiric abx for possible sepsis of unknown source however suspected UTI.    Labs returned significant for an acute kidney injury with a creatinine of 2.88, non-anion gap metabolic acidosis, which appears to have been present previously with a bicarb of 11.  Previous values include bicarb of 12.  Her anion gap is 14.  Unclear if this is a mild anion gap for her in the setting of renal failure, or if there is a another process that is leading to her non-anion gap metabolic acidosis. With labs and BP would consider adrenal insufficiency/hypoaldosteronism of unclear etiology.  However, suspect likely dehydration given hx contributing to hyponatremia and AKI.  Suspect tachycardia blunted by patient's coreg. No bradycardia to suggest myxedema coma.   Patient without much response to 3L of NS, blood pressures fluctuating in ED. She continues to [facility] well and has a normal lactic acid.  Discussed with PCCM. Plan to give more fluids, and if no improvement, PCCM will assess as pt would likely require pressors.     Final Clinical Impressions(s) / ED Diagnoses   Final diagnoses:  Sepsis with acute renal failure, due to unspecified organism, unspecified acute renal failure type, unspecified whether septic shock present ([place])  Acute kidney injury (AKI) with acute tubular  necrosis (ATN) ([facility])  Hyponatremia  Hyperkalemia  Urinary tract infection with hematuria, site unspecified  Leukocytosis, unspecified type  Metabolic acidosis    ED Discharge Orders    None         Gareth Morgan, MD 03/27/19 1707

## 2019-03-27 NOTE — Progress Notes (Signed)
A consult was received from an ED physician for vancomycin and zosyn per pharmacy dosing.  The patient's profile has been reviewed for ht/wt/allergies/indication/available labs.   A one time order has been placed for vanc 1250 mg and zosyn 3.375 gm IV x 1 dose.   Further antibiotics/pharmacy consults should be ordered by admitting physician if indicated.                       Thank you,  Eudelia Bunch, Pharm.D [phone] 03/27/2019 1:32 PM

## 2019-03-27 NOTE — H&P (Addendum)
NAME:  Jocelyn Sanchez, MRN:  [id number], DOB:  05-08-1956, LOS: 0 ADMISSION DATE:  03/27/2019,  CHIEF COMPLAINT:  Hypotension    Brief History     History of present illness   63 year old chronically ill woman with a history of multiple sclerosis, diabetes, hypertension, pulmonary embolism (10/2015), anemia.  She has sacral decubitus ulcers and was admitted with septic shock in February [id number], course complicated by C. difficile colitis.  She also has a history of upper GI bleeding with ICU admission August 2020.  EGD at that time revealed gastric angiodysplasia.  She was sent home back on Eliquis 2.5 mg twice daily.  Continued on a stable diuretic regimen and carvedilol. She presented to the emergency department 10/8 with complaint of hematuria.  She was found to be hypotensive and volume resuscitated with 4 L IV fluid.  Urinalysis consistent with probable urinary tract infection, culture pending.  Lactic acid was normal.  Metabolic panel consistent with hypovolemic hyponatremia with mild hyperkalemia and acute renal failure compared with 1 month ago.  Also with an anion gap metabolic acidosis.  She has a leukocytosis without a left shift.  Her hemoglobin has dropped from 11.5 at the time of her recent discharge to 8.7.  Past Medical History   has a past medical history of Anemia, DM II (diabetes mellitus, type II), controlled ([place]), Gait disorder, HTN (hypertension), Lymphedema, MS (multiple sclerosis) ([place]), and Pulmonary embolism ([place]).   Significant Hospital Events     Consults:    Procedures:  R IJ CVC (ED) 10/8 >>   Significant Diagnostic Tests:  CXR 10/8 >> no infiltrates  Micro Data:  SARS CoV2 10/8 >> negative Blood 10/8 >>  Urine 10/8 >>    Antimicrobials:  Vanco 10/8 >>  Zosyn 10/8 >>    Interim history/subjective:    Objective   Blood pressure (!) 85/37, pulse 84, temperature 98 F (36.7 C), temperature source Oral, resp. rate 16, SpO2 100 %.       No intake  or output data in the 24 hours ending 03/27/19 1627 There were no vitals filed for this visit.  Examination: General: Chronically ill-appearing woman, lying in bed HENT: Oropharynx moist, no oral lesions, pupils equal Lungs: Distant, decreased posterior laterally, no wheezing or crackles Cardiovascular: Regular, no murmur Abdomen: Soft, nondistended with positive bowel sounds Extremities: Flexion contracture of her left upper extremity, edema in all extremities with some contractures in the feet as well Skin sacral decubitus ulcer, stage IV, bilateral heel ulcers Neuro: Awake, alert, interacting and answering questions appropriately.  Good insight.  Unable to move any of her extremities.  She can move her head.  Strong cough  Resolved Hospital Problem list     Assessment & Plan:  Shock, likely septic shock plus component of hypovolemic shock.  Presumed source urinary tract infection and also possible decubitus wounds -Right IJ CVC placed by ED, transduce CVP to guide volume resuscitation -Empiric antibiotics as above, culture data pending -Follow lactate to ensure no rise given her persistent hypotension -Consider adrenal insufficiency, random cortisol and a.m. 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 -Frequent turning -Careful with home methocarbamol dosing, typically on 250 mg every 8 hours as needed.  Consider decrease    Best practice:  Diet: Carb modified, heart healthy Pain/Anxiety/Delirium protocol (if indicated): N/A.  Hold home Xanax and Vicodin, Robaxin VAP protocol (if indicated): N/A DVT prophylaxis: Eliquis 2.5 mg twice daily GI prophylaxis: Home Protonix, Pepcid Glucose control: SSI per protocol Mobility: Bedrest, frequent turning Code Status: DNR, pressors OK Family Communication: updated sister Debora Disposition: ICU / Stepdown  Labs   CBC: Recent Labs  Lab 03/27/19 1248  WBC 16.1*  NEUTROABS 11.4*  HGB 8.7*  HCT 28.5*  MCV 80.5  PLT 576*    Basic Metabolic Panel: Recent Labs  Lab 03/27/19 1248  NA 124*  K 5.3*  CL 99  CO2 11*  GLUCOSE 133*  BUN 68*  CREATININE 2.88*  CALCIUM 9.0   GFR: CrCl cannot be calculated (Unknown ideal weight.). Recent Labs  Lab 03/27/19 1242 03/27/19 1248  WBC  --  16.1*  LATICACIDVEN 0.9  --     Liver Function Tests: Recent Labs  Lab 03/27/19 1248  AST 9*  ALT 9  ALKPHOS 100  BILITOT 0.7  PROT 8.6*  ALBUMIN 2.7*   No results for input(s): LIPASE, AMYLASE in the last 168 hours. No results for input(s): AMMONIA in the last 168 hours.  ABG    Component Value Date/Time   HCO3 16.8 (L) 08/07/2017 2305   TCO2 18 (L) 08/07/2017 2310   ACIDBASEDEF 7.4 (H) 08/07/2017 2305   O2SAT 45.1 08/07/2017 2305     Coagulation Profile: No results for input(s): INR, PROTIME in the last 168 hours.  Cardiac Enzymes: No results for input(s): CKTOTAL, CKMB, CKMBINDEX, TROPONINI in the last 168 hours.  HbA1C: Hgb A1c MFr Bld  Date/Time Value Ref Range Status  02/11/2019 02:03 AM 5.9 (H) 4.8 - 5.6 % Final    Comment:    (NOTE) Pre diabetes:          5.7%-6.4% Diabetes:              >6.4% Glycemic control for   <7.0% adults with diabetes   11/28/2014 11:15 AM  6.7 (H) 4.8 - 5.6 % Final    Comment:    (NOTE)         Pre-diabetes: 5.7 - 6.4         Diabetes: >6.4         Glycemic control for adults with diabetes: <7.0     CBG: No results for input(s): GLUCAP in the last 168 hours.  Review of Systems:   Feels better, still a bit weak C/o B LE foot pain  Past Medical History  She,  has a past medical history of Anemia, DM II (diabetes mellitus, type II), controlled ([place]), Gait disorder, HTN (hypertension), Lymphedema, MS (multiple sclerosis) ([place]), and Pulmonary embolism ([place]).   Surgical History    Past Surgical History:  Procedure Laterality Date  . ESOPHAGOGASTRODUODENOSCOPY N/A 07/12/2016   Procedure: ESOPHAGOGASTRODUODENOSCOPY (EGD);  Surgeon: Irene Shipper, MD;  Location: [facility];  Service: Endoscopy;  Laterality: N/A;  . ESOPHAGOGASTRODUODENOSCOPY N/A 02/11/2019   Procedure: ESOPHAGOGASTRODUODENOSCOPY (EGD);  Surgeon: Carol Ada, MD;  Location: [facility];  Service: Endoscopy;  Laterality: N/A;  .  FLEXIBLE SIGMOIDOSCOPY Left 03/09/2015   Procedure: FLEXIBLE SIGMOIDOSCOPY;  Surgeon: Carol Ada, MD;  Location: [facility];  Service: Endoscopy;  Laterality: Left;  . HOT HEMOSTASIS N/A 02/11/2019   Procedure: HOT HEMOSTASIS (ARGON PLASMA COAGULATION/BICAP);  Surgeon: Carol Ada, MD;  Location: [facility];  Service: Endoscopy;  Laterality: N/A;     Social History   reports that she has quit smoking. Her smoking use included cigarettes. She has never used smokeless tobacco. She reports that she does not drink alcohol or use drugs.   Family History   Her family history includes Multiple sclerosis in her mother.   Allergies Allergies  Allergen Reactions  . Sulfa Antibiotics Shortness Of Breath and Swelling     Home Medications  Prior to Admission medications   Medication Sig Start Date End Date Taking? Authorizing Provider  acetaminophen (TYLENOL) 500 MG tablet Take 1,000 mg by mouth every 6 (six) hours as needed for  moderate pain.    [provider]  ALPRAZolam Duanne Moron) 0.5 MG tablet Take 0.5 mg by mouth 2 (two) times daily as needed for anxiety.    [provider]  apixaban (ELIQUIS) 2.5 MG TABS tablet Take 1 tablet (2.5 mg total) by mouth 2 (two) times daily. 07/14/16   Dixie Dials, MD  carvedilol (COREG CR) 10 MG 24 hr capsule Take 1 capsule (10 mg total) by mouth daily. 02/15/19   Nita Sells, MD  famotidine (PEPCID) 20 MG tablet Take 1 tablet (20 mg total) by mouth 2 (two) times daily. 08/16/17 02/10/19  Arrien, Jimmy Picket, MD  furosemide (LASIX) 20 MG tablet Take 20 mg by mouth daily.     [provider]  HYDROcodone-acetaminophen (NORCO/VICODIN) 5-325 MG per tablet Take 2 tablets by mouth every 4 (four) hours as needed for moderate pain. 03/12/15   Robbie Lis, MD  methocarbamol (ROBAXIN) 500 MG tablet Take 0.5 tablets (250 mg total) by mouth every 8 (eight) hours as needed for muscle spasms. 03/12/15   Robbie Lis, MD  pantoprazole (PROTONIX) 40 MG tablet Take 1 tablet (40 mg total) by mouth daily. 02/15/19   Nita Sells, MD     Critical care time: 60 minutes      Baltazar Apo, MD, PhD 03/27/2019, 5:16 PM [facility] [phone] or if no answer [phone]

## 2019-03-27 NOTE — Progress Notes (Signed)
Pharmacy Antibiotic Note  Jocelyn Sanchez is a 63 y.o. female admitted on 03/27/2019 with sepsis.  Pharmacy has been consulted for vancomycin and cefepime dosing for sepsis of presumed urinary source & also possible decubitus wounds. Wt 02/13/2019 66.3 kg in Epic. Est CrCl using 2.88 SCr and 66.3 kg is ~ 21 ml/min  Plan: Cefepime 2 gm IV q24 Vancomycin 1250 mg IV loading dose ordered earlier - not yet given in ED Vancomycin 750 mg IV Q 48 hrs. Goal AUC 400-550. Expected AUC: 473.7 SCr used: 2.88, Wt 66.3 kg F/u renal function, WBC, temp, culture data Vancomycin levels as needed    Temp (24hrs), Avg:98 F (36.7 C), Min:98 F (36.7 C), Max:98 F (36.7 C)  Recent Labs  Lab 03/27/19 1242 03/27/19 1248  WBC  --  16.1*  CREATININE  --  2.88*  LATICACIDVEN 0.9  --     CrCl cannot be calculated (Unknown ideal weight.).    Allergies  Allergen Reactions  . Sulfa Antibiotics Shortness Of Breath and Swelling    Antimicrobials this admission: 10/8 zosyn x 1 dose 10/8 vanc>> 10/8 cefepime>> Dose adjustments this admission:  Microbiology results: 10/8 UCx: ip 10/8 BCx: ip  Thank you for allowing pharmacy to be a part of this patient's care.  Eudelia Bunch, Pharm.D [phone] 03/27/2019 6:02 PM

## 2019-03-27 NOTE — ED Triage Notes (Signed)
Transported by [facility] from home-- hx of MS. Patient presents today as family reports hematuria and poor healing wounds to the heels of both feet. Patient is AAO x 4

## 2019-03-27 NOTE — ED Provider Notes (Signed)
  Physical Exam  BP (!) 85/50   Pulse 85   Temp 98 F (36.7 C) (Oral)   Resp 16   SpO2 100%   Physical Exam  ED Course/Procedures     .Central Line  Date/Time: 03/27/2019 6:05 PM Performed by: Drenda Freeze, MD Authorized by: Drenda Freeze, MD   Consent:    Consent obtained:  Verbal   Consent given by:  Patient Pre-procedure details:    Hand hygiene: Hand hygiene performed prior to insertion     Sterile barrier technique: All elements of maximal sterile technique followed     Skin preparation:  2% chlorhexidine   Skin preparation agent: Skin preparation agent completely dried prior to procedure   Anesthesia (see MAR for exact dosages):    Anesthesia method:  Local infiltration   Local anesthetic:  Lidocaine 1% w/o epi Procedure details:    Location:  R internal jugular   Patient position:  Trendelenburg   Procedural supplies:  Triple lumen   Catheter size:  7.5 Fr   Landmarks identified: yes     Ultrasound guidance: yes     Sterile ultrasound techniques: Sterile gel and sterile probe covers were used     Number of attempts:  1   Successful placement: yes   Post-procedure details:    Post-procedure:  Dressing applied   Assessment:  Blood return through all ports and no pneumothorax on x-ray   Patient tolerance of procedure:  Tolerated well, no immediate complications    MDM  Care assumed at 3 pm.  Patient has history of multiple sclerosis with a sacral decub ulcer and here presenting with sepsis.  Patient was hypotensive in the 70s and has a urinary tract infection.  Patient is receiving fourth liter of normal saline bolus.  Patient only had one IV site and remained hypotensive in the 80s.  I placed a central line and signout pending reassessment and possible critical care consult.  5 pm Central line placed, confirmed by CXR. No pneumothorax. Dr. Lamonte Sakai will admit patient. BP up to 90s after bolus. Hold off on pressors currently.   CRITICAL CARE Performed  by: Wandra Arthurs   Total critical care time: 30 minutes  Critical care time was exclusive of separately billable procedures and treating other patients.  Critical care was necessary to treat or prevent imminent or life-threatening deterioration.  Critical care was time spent personally by me on the following activities: development of treatment plan with patient and/or surrogate as well as nursing, discussions with consultants, evaluation of patient's response to treatment, examination of patient, obtaining history from patient or surrogate, ordering and performing treatments and interventions, ordering and review of laboratory studies, ordering and review of radiographic studies, pulse oximetry and re-evaluation of patient's condition.     Drenda Freeze, MD 03/27/19 [phone]

## 2019-03-28 ENCOUNTER — Inpatient Hospital Stay (HOSPITAL_COMMUNITY): Payer: Medicare Other

## 2019-03-28 DIAGNOSIS — E1110 Type 2 diabetes mellitus with ketoacidosis without coma: Secondary | ICD-10-CM

## 2019-03-28 DIAGNOSIS — E872 Acidosis, unspecified: Secondary | ICD-10-CM

## 2019-03-28 DIAGNOSIS — R6520 Severe sepsis without septic shock: Secondary | ICD-10-CM

## 2019-03-28 DIAGNOSIS — N179 Acute kidney failure, unspecified: Secondary | ICD-10-CM

## 2019-03-28 LAB — BASIC METABOLIC PANEL
Anion gap: 13 (ref 5–15)
Anion gap: 10 (ref 5–15)
Anion gap: 10 (ref 5–15)
Anion gap: 9 (ref 5–15)
BUN: 43 mg/dL — ABNORMAL HIGH (ref 8–23)
BUN: 37 mg/dL — ABNORMAL HIGH (ref 8–23)
BUN: 38 mg/dL — ABNORMAL HIGH (ref 8–23)
BUN: 32 mg/dL — ABNORMAL HIGH (ref 8–23)
CO2: 10 mmol/L — ABNORMAL LOW (ref 22–32)
CO2: 12 mmol/L — ABNORMAL LOW (ref 22–32)
CO2: 12 mmol/L — ABNORMAL LOW (ref 22–32)
CO2: 12 mmol/L — ABNORMAL LOW (ref 22–32)
Calcium: 8.2 mg/dL — ABNORMAL LOW (ref 8.9–10.3)
Calcium: 8.2 mg/dL — ABNORMAL LOW (ref 8.9–10.3)
Calcium: 8.1 mg/dL — ABNORMAL LOW (ref 8.9–10.3)
Calcium: 8.1 mg/dL — ABNORMAL LOW (ref 8.9–10.3)
Chloride: 107 mmol/L (ref 98–111)
Chloride: 111 mmol/L (ref 98–111)
Chloride: 111 mmol/L (ref 98–111)
Chloride: 113 mmol/L — ABNORMAL HIGH (ref 98–111)
Creatinine, Ser: 1.34 mg/dL — ABNORMAL HIGH (ref 0.44–1.00)
Creatinine, Ser: 1.09 mg/dL — ABNORMAL HIGH (ref 0.44–1.00)
Creatinine, Ser: 0.99 mg/dL (ref 0.44–1.00)
Creatinine, Ser: 0.9 mg/dL (ref 0.44–1.00)
GFR calc Af Amer: 48 mL/min — ABNORMAL LOW (ref >60)
GFR calc Af Amer: >60 mL/min (ref >60)
GFR calc Af Amer: >60 mL/min (ref >60)
GFR calc Af Amer: >60 mL/min (ref >60)
GFR calc non Af Amer: 42 mL/min — ABNORMAL LOW (ref >60)
GFR calc non Af Amer: 54 mL/min — ABNORMAL LOW (ref >60)
GFR calc non Af Amer: >60 mL/min (ref >60)
GFR calc non Af Amer: >60 mL/min (ref >60)
Glucose, Bld: 247 mg/dL — ABNORMAL HIGH (ref 70–99)
Glucose, Bld: 110 mg/dL — ABNORMAL HIGH (ref 70–99)
Glucose, Bld: 157 mg/dL — ABNORMAL HIGH (ref 70–99)
Glucose, Bld: 127 mg/dL — ABNORMAL HIGH (ref 70–99)
Potassium: 3.7 mmol/L (ref 3.5–5.1)
Potassium: 4.1 mmol/L (ref 3.5–5.1)
Potassium: 4 mmol/L (ref 3.5–5.1)
Potassium: 3.8 mmol/L (ref 3.5–5.1)
Sodium: 130 mmol/L — ABNORMAL LOW (ref 135–145)
Sodium: 133 mmol/L — ABNORMAL LOW (ref 135–145)
Sodium: 133 mmol/L — ABNORMAL LOW (ref 135–145)
Sodium: 134 mmol/L — ABNORMAL LOW (ref 135–145)

## 2019-03-28 LAB — CBC
HCT: 28.5 % — ABNORMAL LOW (ref 36.0–46.0)
Hemoglobin: 8.6 g/dL — ABNORMAL LOW (ref 12.0–15.0)
MCH: 24.4 pg — ABNORMAL LOW (ref 26.0–34.0)
MCHC: 30.2 g/dL (ref 30.0–36.0)
MCV: 80.7 fL (ref 80.0–100.0)
Platelets: 596 10*3/uL — ABNORMAL HIGH (ref 150–400)
RBC: 3.53 MIL/uL — ABNORMAL LOW (ref 3.87–5.11)
RDW: 25.5 % — ABNORMAL HIGH (ref 11.5–15.5)
WBC: 27.3 10*3/uL — ABNORMAL HIGH (ref 4.0–10.5)
nRBC: 0 % (ref 0.0–0.2)

## 2019-03-28 LAB — GLUCOSE, CAPILLARY
Glucose-Capillary: 219 mg/dL — ABNORMAL HIGH (ref 70–99)
Glucose-Capillary: 204 mg/dL — ABNORMAL HIGH (ref 70–99)
Glucose-Capillary: 159 mg/dL — ABNORMAL HIGH (ref 70–99)
Glucose-Capillary: 108 mg/dL — ABNORMAL HIGH (ref 70–99)
Glucose-Capillary: 151 mg/dL — ABNORMAL HIGH (ref 70–99)
Glucose-Capillary: 144 mg/dL — ABNORMAL HIGH (ref 70–99)
Glucose-Capillary: 151 mg/dL — ABNORMAL HIGH (ref 70–99)
Glucose-Capillary: 144 mg/dL — ABNORMAL HIGH (ref 70–99)
Glucose-Capillary: 140 mg/dL — ABNORMAL HIGH (ref 70–99)
Glucose-Capillary: 139 mg/dL — ABNORMAL HIGH (ref 70–99)
Glucose-Capillary: 108 mg/dL — ABNORMAL HIGH (ref 70–99)

## 2019-03-28 LAB — COMPREHENSIVE METABOLIC PANEL
ALT: 10 U/L (ref 0–44)
AST: 10 U/L — ABNORMAL LOW (ref 15–41)
Albumin: 2.1 g/dL — ABNORMAL LOW (ref 3.5–5.0)
Alkaline Phosphatase: 87 U/L (ref 38–126)
Anion gap: 16 — ABNORMAL HIGH (ref 5–15)
BUN: 46 mg/dL — ABNORMAL HIGH (ref 8–23)
CO2: 8 mmol/L — ABNORMAL LOW (ref 22–32)
Calcium: 8.3 mg/dL — ABNORMAL LOW (ref 8.9–10.3)
Chloride: 106 mmol/L (ref 98–111)
Creatinine, Ser: 1.41 mg/dL — ABNORMAL HIGH (ref 0.44–1.00)
GFR calc Af Amer: 46 mL/min — ABNORMAL LOW (ref >60)
GFR calc non Af Amer: 39 mL/min — ABNORMAL LOW (ref >60)
Glucose, Bld: 280 mg/dL — ABNORMAL HIGH (ref 70–99)
Potassium: 4 mmol/L (ref 3.5–5.1)
Sodium: 130 mmol/L — ABNORMAL LOW (ref 135–145)
Total Bilirubin: 0.8 mg/dL (ref 0.3–1.2)
Total Protein: 7.1 g/dL (ref 6.5–8.1)

## 2019-03-28 LAB — PROCALCITONIN: Procalcitonin: 9.98 ng/mL

## 2019-03-28 LAB — PHOSPHORUS
Phosphorus: 3.6 mg/dL (ref 2.5–4.6)
Phosphorus: 3.1 mg/dL (ref 2.5–4.6)

## 2019-03-28 LAB — MRSA PCR SCREENING: MRSA by PCR: NEGATIVE

## 2019-03-28 LAB — CORTISOL-AM, BLOOD: Cortisol - AM: 17.8 ug/dL (ref 6.7–22.6)

## 2019-03-28 LAB — URINE CULTURE

## 2019-03-28 LAB — MAGNESIUM
Magnesium: 1.4 mg/dL — ABNORMAL LOW (ref 1.7–2.4)
Magnesium: 1.3 mg/dL — ABNORMAL LOW (ref 1.7–2.4)

## 2019-03-28 LAB — LACTIC ACID, PLASMA: Lactic Acid, Venous: 0.8 mmol/L (ref 0.5–1.9)

## 2019-03-28 LAB — BETA-HYDROXYBUTYRIC ACID: Beta-Hydroxybutyric Acid: 1.84 mmol/L — ABNORMAL HIGH (ref 0.05–0.27)

## 2019-03-28 LAB — TROPONIN I (HIGH SENSITIVITY): Troponin I (High Sensitivity): 5 ng/L (ref <18)

## 2019-03-28 MED ORDER — SODIUM CHLORIDE 0.9% FLUSH: 10.0000 mL | Freq: Two times a day (BID) | INTRAVENOUS | Status: DC

## 2019-03-28 MED ORDER — VANCOMYCIN HCL IN DEXTROSE 1-5 GM/200ML-% IV SOLN
1000.0000 mg | INTRAVENOUS | Status: DC
  Administered 2019-03-29: 1000 mg via INTRAVENOUS
  Filled 2019-03-28: qty 200

## 2019-03-28 MED ORDER — MIDODRINE HCL 5 MG PO TABS
5.0000 mg | ORAL_TABLET | Freq: Three times a day (TID) | ORAL | Status: DC
  Administered 2019-03-28 – 2019-03-30 (×7): 5 mg via ORAL
  Filled 2019-03-28 (×7): qty 1

## 2019-03-28 MED ORDER — DEXTROSE-NACL 5-0.45 % IV SOLN: INTRAVENOUS | Status: DC

## 2019-03-28 MED ORDER — INSULIN REGULAR(HUMAN) IN NACL 100-0.9 UT/100ML-% IV SOLN
INTRAVENOUS | Status: DC
  Administered 2019-03-28: 1.6 [IU]/h via INTRAVENOUS
  Administered 2019-03-28: 12:00:00 2 [IU]/h via INTRAVENOUS
  Filled 2019-03-28: qty 100

## 2019-03-28 MED ORDER — CHLORHEXIDINE GLUCONATE CLOTH 2 % EX PADS
6.0000 | MEDICATED_PAD | Freq: Every day | CUTANEOUS | Status: DC
  Administered 2019-03-28 – 2019-03-31 (×4): 6 via TOPICAL

## 2019-03-28 MED ORDER — LACTATED RINGERS IV BOLUS
750.0000 mL | Freq: Once | INTRAVENOUS | Status: AC
  Administered 2019-03-28: 750 mL via INTRAVENOUS

## 2019-03-28 MED ORDER — SODIUM CHLORIDE 0.9% FLUSH: 10.0000 mL | INTRAVENOUS | Status: DC | PRN

## 2019-03-28 MED ORDER — POTASSIUM CHLORIDE 10 MEQ/100ML IV SOLN
10.0000 meq | INTRAVENOUS | Status: AC
  Administered 2019-03-28 (×2): 10 meq via INTRAVENOUS
  Filled 2019-03-28 (×2): qty 100

## 2019-03-28 MED ORDER — DEXTROSE 50 % IV SOLN: 12.5000 g | INTRAVENOUS | Status: DC

## 2019-03-28 MED ORDER — SODIUM CHLORIDE 0.9 % IV SOLN: INTRAVENOUS | Status: DC

## 2019-03-28 MED ORDER — INFLUENZA VAC SPLIT QUAD 0.5 ML IM SUSY
0.5000 mL | PREFILLED_SYRINGE | INTRAMUSCULAR | Status: AC
  Administered 2019-03-29: 0.5 mL via INTRAMUSCULAR
  Filled 2019-03-28: qty 0.5

## 2019-03-28 MED ORDER — SODIUM CHLORIDE 0.9 % IV SOLN
2.0000 g | Freq: Two times a day (BID) | INTRAVENOUS | Status: DC
  Administered 2019-03-28 – 2019-03-30 (×5): 2 g via INTRAVENOUS
  Filled 2019-03-28 (×5): qty 2

## 2019-03-28 MED ORDER — LACTATED RINGERS IV BOLUS
1000.0000 mL | Freq: Once | INTRAVENOUS | Status: AC
  Administered 2019-03-28: 1000 mL via INTRAVENOUS

## 2019-03-28 MED ORDER — DEXTROSE-NACL 5-0.45 % IV SOLN
INTRAVENOUS | Status: DC
  Administered 2019-03-28: 14:00:00 via INTRAVENOUS

## 2019-03-28 MED ORDER — DEXTROSE IN LACTATED RINGERS 5 % IV SOLN
INTRAVENOUS | Status: DC
  Administered 2019-03-28 (×2): 75 mL/h via INTRAVENOUS

## 2019-03-28 NOTE — ED Notes (Addendum)
ED TO INPATIENT HANDOFF REPORT  ED Nurse Name and Phone #: [place] Highland [phone]  S Name/Age/Gender Jocelyn Sanchez Show 63 y.o. female Room/Bed: WA20/WA20  Code Status   Code Status: DNR  Home/SNF/Other Home Patient oriented to: self, place, time and situation Is this baseline? Yes   Triage Complete: Triage complete  Chief Complaint hematuria wound infection  Triage Note Transported by [facility] from home-- hx of MS. Patient presents today as family reports hematuria and poor healing wounds to the heels of both feet. Patient is AAO x 4    Allergies Allergies  Allergen Reactions  . Sulfa Antibiotics Shortness Of Breath and Swelling    Level of Care/Admitting Diagnosis ED Disposition    ED Disposition Condition [facility] Area: [place] [[id number]]  Level of Care: ICU [6]  Covid Evaluation: Person Under Investigation (PUI)  Diagnosis: Septic shock ([place]) [id number]  Admitting Physician: Collene Gobble [[id number]]  Attending Physician: Collene Gobble [[id number]]  Estimated length of stay: 3 - 4 days  Certification:: I certify this patient will need inpatient services for at least 2 midnights  PT Class (Do Not Modify): Inpatient [101]  PT Acc Code (Do Not Modify): Private [1]       B Medical/Surgery History Past Medical History:  Diagnosis Date  . Anemia   . DM II (diabetes mellitus, type II), controlled ([place])   . Gait disorder   . HTN (hypertension)   . Lymphedema   . MS (multiple sclerosis) ([place])   . Pulmonary embolism [facility])    Past Surgical History:  Procedure Laterality Date  . ESOPHAGOGASTRODUODENOSCOPY N/A 07/12/2016   Procedure: ESOPHAGOGASTRODUODENOSCOPY (EGD);  Surgeon: Irene Shipper, MD;  Location: [facility];  Service: Endoscopy;  Laterality: N/A;  . ESOPHAGOGASTRODUODENOSCOPY N/A 02/11/2019   Procedure: ESOPHAGOGASTRODUODENOSCOPY (EGD);  Surgeon: Carol Ada, MD;  Location: [facility];  Service: Endoscopy;  Laterality: N/A;   . FLEXIBLE SIGMOIDOSCOPY Left 03/09/2015   Procedure: FLEXIBLE SIGMOIDOSCOPY;  Surgeon: Carol Ada, MD;  Location: [facility] ENDOSCOPY;  Service: Endoscopy;  Laterality: Left;  . HOT HEMOSTASIS N/A 02/11/2019   Procedure: HOT HEMOSTASIS (ARGON PLASMA COAGULATION/BICAP);  Surgeon: Carol Ada, MD;  Location: [facility];  Service: Endoscopy;  Laterality: N/A;     A IV Location/Drains/Wounds Patient Lines/Drains/Airways Status   Active Line/Drains/Airways    Name:   Placement date:   Placement time:   Site:   Days:   Peripheral IV 03/27/19 Right;Upper Arm   03/27/19    1245    Arm   1   CVC Triple Lumen 03/27/19 Right   03/27/19    1816     1   External Urinary Catheter   02/10/19    1444    -   46          Intake/Output Last 24 hours  Intake/Output Summary (Last 24 hours) at 03/28/2019 0246 Last data filed at 03/27/2019 2211 Gross per 24 hour  Intake 2350 ml  Output -  Net 2350 ml    Labs/Imaging Results for orders placed or performed during the hospital encounter of 03/27/19 (from the past 48 hour(s))  Lactic acid, plasma     Status: None   Collection Time: 03/27/19 12:42 PM  Result Value Ref Range   Lactic Acid, Venous 0.9 0.5 - 1.9 mmol/L    Comment: Performed at [facility], [place] [address] [place], [place] [address]  SARS Coronavirus 2 by RT PCR (hospital order, performed in  [facility] hospital lab) Nasopharyngeal Nasopharyngeal Swab     Status: None   Collection Time: 03/27/19 12:42 PM   Specimen: Nasopharyngeal Swab  Result Value Ref Range   SARS Coronavirus 2 NEGATIVE NEGATIVE    Comment: (NOTE) If result is NEGATIVE SARS-CoV-2 target nucleic acids are NOT DETECTED. The SARS-CoV-2 RNA is generally detectable in upper and lower  respiratory specimens during the acute phase of infection. The lowest  concentration of SARS-CoV-2 viral copies this assay can detect is 250  copies / mL. A negative result does not preclude SARS-CoV-2 infection  and  should not be used as the sole basis for treatment or other  patient management decisions.  A negative result may occur with  improper specimen collection / handling, submission of specimen other  than nasopharyngeal swab, presence of viral mutation(s) within the  areas targeted by this assay, and inadequate number of viral copies  (<250 copies / mL). A negative result must be combined with clinical  observations, patient history, and epidemiological information. If result is POSITIVE SARS-CoV-2 target nucleic acids are DETECTED. The SARS-CoV-2 RNA is generally detectable in upper and lower  respiratory specimens dur ing the acute phase of infection.  Positive  results are indicative of active infection with SARS-CoV-2.  Clinical  correlation with patient history and other diagnostic information is  necessary to determine patient infection status.  Positive results do  not rule out bacterial infection or co-infection with other viruses. If result is PRESUMPTIVE POSTIVE SARS-CoV-2 nucleic acids MAY BE PRESENT.   A presumptive positive result was obtained on the submitted specimen  and confirmed on repeat testing.  While 2019 novel coronavirus  (SARS-CoV-2) nucleic acids may be present in the submitted sample  additional confirmatory testing may be necessary for epidemiological  and / or clinical management purposes  to differentiate between  SARS-CoV-2 and other Sarbecovirus currently known to infect humans.  If clinically indicated additional testing with an alternate test  methodology [phone]) is advised. The SARS-CoV-2 RNA is generally  detectable in upper and lower respiratory sp ecimens during the acute  phase of infection. The expected result is Negative. Fact Sheet for Patients:  StrictlyIdeas.no Fact Sheet for Healthcare Providers: BankingDealers.co.za This test is not yet approved or cleared by the [place] FDA and has been  authorized for detection and/or diagnosis of SARS-CoV-2 by FDA under an Emergency Use Authorization (EUA).  This EUA will remain in effect (meaning this test can be used) for the duration of the COVID-19 declaration under Section 564(b)(1) of the Act, 21 U.S.C. section 360bbb-3(b)(1), unless the authorization is terminated or revoked sooner. Performed at [facility] [place] [address], [place], [place] [address]   CBC with Differential     Status: Abnormal   Collection Time: 03/27/19 12:48 PM  Result Value Ref Range   WBC 16.1 (H) 4.0 - 10.5 K/uL   RBC 3.54 (L) 3.87 - 5.11 MIL/uL   Hemoglobin 8.7 (L) 12.0 - 15.0 g/dL   HCT 28.5 (L) 36.0 - 46.0 %   MCV 80.5 80.0 - 100.0 fL   MCH 24.6 (L) 26.0 - 34.0 pg   MCHC 30.5 30.0 - 36.0 g/dL   RDW 25.0 (H) 11.5 - 15.5 %   Platelets 576 (H) 150 - 400 K/uL   nRBC 0.0 0.0 - 0.2 %   Neutrophils Relative % 70 %   Neutro Abs 11.4 (H) 1.7 - 7.7 K/uL   Lymphocytes Relative 15 %   Lymphs Abs 2.4  0.7 - 4.0 K/uL   Monocytes Relative 10 %   Monocytes Absolute 1.6 (H) 0.1 - 1.0 K/uL   Eosinophils Relative 3 %   Eosinophils Absolute 0.4 0.0 - 0.5 K/uL   Basophils Relative 1 %   Basophils Absolute 0.1 0.0 - 0.1 K/uL   Immature Granulocytes 1 %   Abs Immature Granulocytes 0.18 (H) 0.00 - 0.07 K/uL   Polychromasia PRESENT     Comment: Performed at [facility], [place] [address] [place], [place] [address]  Comprehensive metabolic panel     Status: Abnormal   Collection Time: 03/27/19 12:48 PM  Result Value Ref Range   Sodium 124 (L) 135 - 145 mmol/L   Potassium 5.3 (H) 3.5 - 5.1 mmol/L   Chloride 99 98 - 111 mmol/L   CO2 11 (L) 22 - 32 mmol/L   Glucose, Bld 133 (H) 70 - 99 mg/dL   BUN 68 (H) 8 - 23 mg/dL   Creatinine, Ser 2.88 (H) 0.44 - 1.00 mg/dL   Calcium 9.0 8.9 - 10.3 mg/dL   Total Protein 8.6 (H) 6.5 - 8.1 g/dL   Albumin 2.7 (L) 3.5 - 5.0 g/dL   AST 9 (L) 15 - 41 U/L   ALT 9 0 - 44 U/L   Alkaline  Phosphatase 100 38 - 126 U/L   Total Bilirubin 0.7 0.3 - 1.2 mg/dL   GFR calc non Af Amer 17 (L) >60 mL/min   GFR calc Af Amer 19 (L) >60 mL/min   Anion gap 14 5 - 15    Comment: Performed at [facility], [place] [address] [place], [place] [address]  Urinalysis, Routine w reflex microscopic     Status: Abnormal   Collection Time: 03/27/19  2:57 PM  Result Value Ref Range   Color, Urine AMBER (A) YELLOW   APPearance TURBID (A) CLEAR   Specific Gravity, Urine 1.013 1.005 - 1.030   pH 6.0 5.0 - 8.0   Glucose, UA NEGATIVE NEGATIVE mg/dL   Hgb urine dipstick LARGE (A) NEGATIVE   Bilirubin Urine NEGATIVE NEGATIVE   Ketones, ur 5 (A) NEGATIVE mg/dL   Protein, ur >=300 (A) NEGATIVE mg/dL   Nitrite NEGATIVE NEGATIVE   Leukocytes,Ua MODERATE (A) NEGATIVE   RBC / HPF >50 (H) 0 - 5 RBC/hpf   WBC, UA >50 (H) 0 - 5 WBC/hpf   Bacteria, UA MANY (A) NONE SEEN   Squamous Epithelial / LPF 6-10 0 - 5   WBC Clumps PRESENT     Comment: Performed at [facility], [place] [address], [place], [place] [address]  Cortisol     Status: None   Collection Time: 03/27/19  8:33 PM  Result Value Ref Range   Cortisol, Plasma 20.0 ug/dL    Comment: (NOTE) AM    6.7 - 22.6 ug/dL PM   <10.0       ug/dL Performed at [place] [address], [place], [place] [address]    Dg Chest Port 1 View  Result Date: 03/27/2019 CLINICAL DATA:  Status post central line placement. EXAM: PORTABLE CHEST 1 VIEW COMPARISON:  Chest radiograph 03/27/2019 FINDINGS: Monitoring leads overlie the patient. Stable cardiac and mediastinal contours. Low lung volumes. Bibasilar atelectasis. Interval insertion right IJ central venous catheter tip projecting over the superior vena cava. No pleural effusion or pneumothorax. IMPRESSION: New central venous catheter tip projects over the superior vena cava. Electronically Signed   By: Polly Cobia.D.  On: 03/27/2019 18:10   Dg Chest Portable 1  View  Result Date: 03/27/2019 CLINICAL DATA:  Hypotension EXAM: PORTABLE CHEST 1 VIEW COMPARISON:  02/10/2019 FINDINGS: The heart size and mediastinal contours are stable. Calcific aortic knob. Both lungs are clear. The visualized skeletal structures are unremarkable. IMPRESSION: No active disease. Electronically Signed   By: Davina Poke M.D.   On: 03/27/2019 13:51    Pending Labs Unresulted Labs (From admission, onward)    Start     Ordered   03/28/19 0500  Cortisol-am, blood  Tomorrow morning,   R     03/27/19 2032   03/28/19 0500  Procalcitonin  Tomorrow morning,   R     03/27/19 2032   03/28/19 0500  Comprehensive metabolic panel  Tomorrow morning,   R     03/27/19 2032   03/28/19 0500  CBC  Tomorrow morning,   R     03/27/19 2032   03/28/19 0500  Lactic acid, plasma  Tomorrow morning,   R     03/27/19 2032   03/28/19 0500  Magnesium  Tomorrow morning,   R     03/27/19 2032   03/28/19 0500  Phosphorus  Tomorrow morning,   R     03/27/19 2032   03/27/19 1224  Urine culture  ONCE - STAT,   STAT     03/27/19 1224   03/27/19 1216  Blood culture (routine x 2)  BLOOD CULTURE X 2,   STAT,   Status:  Canceled     03/27/19 1219          Vitals/Pain Today's Vitals   03/28/19 0210 03/28/19 0215 03/28/19 0220 03/28/19 0230  BP: (!) 106/57 (!) 110/56 (!) 112/58 (!) 93/55  Pulse: (!) 104  (!) 104 98  Resp: 15 14 14 15   Temp:      TempSrc:      SpO2: 100%  100% 100%  PainSc:        Isolation Precautions Airborne and Contact precautions  Medications Medications  acetaminophen (TYLENOL) tablet 1,000 mg (1,000 mg Oral Given 03/27/19 2251)  famotidine (PEPCID) tablet 20 mg (20 mg Oral Given 03/27/19 2251)  pantoprazole (PROTONIX) EC tablet 40 mg (40 mg Oral Given 03/27/19 2251)  apixaban (ELIQUIS) tablet 5 mg (has no administration in time range)  lactated ringers infusion (has no administration in time range)  norepinephrine (LEVOPHED) 4mg  in 269mL premix infusion (11 mcg/min  Intravenous Rate/Dose Change 03/28/19 0102)  acetaminophen (TYLENOL) tablet 650 mg (has no administration in time range)  ceFEPIme (MAXIPIME) 2 g in sodium chloride 0.9 % 100 mL IVPB (0 g Intravenous Stopped 03/27/19 2211)  vancomycin (VANCOCIN) IVPB 750 mg/150 ml premix (has no administration in time range)  sodium chloride 0.9 % bolus 1,000 mL (0 mLs Intravenous Stopped 03/27/19 1448)  sodium chloride 0.9 % bolus 1,000 mL (0 mLs Intravenous Stopped 03/27/19 2007)  sodium chloride 0.9 % bolus 1,000 mL (0 mLs Intravenous Stopped 03/27/19 2007)  vancomycin (VANCOCIN) 1,250 mg in sodium chloride 0.9 % 250 mL IVPB (0 mg Intravenous Stopped 03/27/19 2008)  piperacillin-tazobactam (ZOSYN) IVPB 3.375 g (0 g Intravenous Stopped 03/27/19 1448)  sodium chloride 0.9 % bolus 1,000 mL (0 mLs Intravenous Stopped 03/27/19 1600)    Mobility non-ambulatory Low fall risk   Focused Assessments Cardiac Assessment Handoff:    Lab Results  Component Value Date   TROPONINI 0.09 (H) 10/27/2015   Lab Results  Component Value Date   DDIMER 9.09 (H) 10/26/2015  Does the Patient currently have chest pain? No     R Recommendations: See Admitting Provider Note   Report given to: Normajean Baxter  Additional Notes:

## 2019-03-28 NOTE — Progress Notes (Signed)
Pharmacy Antibiotic Note  Jocelyn Sanchez is a 63 y.o. female admitted on 03/27/2019 with sepsis.  Pharmacy has been consulted for vancomycin and cefepime dosing for sepsis of presumed urinary source & also possible decubitus wounds.  03/28/2019 Scr 1.41, CrCl ~ 33.30mls/min  Plan: Increase Cefepime 2 gm IV to q12h Increase vancomycin to 1gm IV q36h  Expected AUC: 460.5, SCr 1.41, Wt 62.3 kg F/u renal function, WBC, temp, culture data Vancomycin levels as needed  Height: 5\' 3"  (160 cm) Weight: 137 lb 5.6 oz (62.3 kg) IBW/kg (Calculated) : 52.4  Temp (24hrs), Avg:97.9 F (36.6 C), Min:97.4 F (36.3 C), Max:98.4 F (36.9 C)  Recent Labs  Lab 03/27/19 1242 03/27/19 1248 03/28/19 0530  WBC  --  16.1* 27.3*  CREATININE  --  2.88* 1.41*  LATICACIDVEN 0.9  --  0.8    Estimated Creatinine Clearance: 33.3 mL/min (A) (by C-G formula based on SCr of 1.41 mg/dL (H)).    Allergies  Allergen Reactions  . Sulfa Antibiotics Shortness Of Breath and Swelling    Antimicrobials this admission: 10/8 zosyn x 1 dose 10/8 vanc>> 10/8 cefepime>> Dose adjustments this admission:  Microbiology results: 10/8 UCx: ip 10/8 BCx: ip  Thank you for allowing pharmacy to be a part of this patient's care.  Dolly Rias RPh 03/28/2019, 10:47 AM Pager [phone]

## 2019-03-28 NOTE — Consult Note (Signed)
[facility] Nurse wound consult note Reason for Consult: Stage 3 pressure injury to sacrum and nonblanchable erythema to heels.  Wound type:pressure Pressure Injury POA: Yes Measurement: sacrum  4 cm x 2.5 cm x 0.5 cm  Heels with 2 cm x 2 cm erythema Wound  [id number] pink Drainage (amount, consistency, odor) minimal serosanguinous  No odor Periwound:intact  Purewick external urinary management device in place  Dressing procedure/placement/frequency: turn and reposition every two hours.  Float heels with a pillow under calf.  Cleanse sacral wound with NS and pat gently dry. Apply Calcium alginate dressing to wound bed. Cover with silicone foam.  Change M/W/F Will not follow at this time.  Please re-consult if needed.  Domenic Moras MSN, RN, FNP-BC CWON Wound, Ostomy, Continence Nurse Pager [phone]

## 2019-03-28 NOTE — Progress Notes (Addendum)
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  Admitted on 10/8 with working diagnosis of septic shock felt likely from urinary tract source and/or chronic sacral wound  Past Medical History   has a past medical history of Anemia, DM II (diabetes mellitus, type II), controlled ([place]), Gait disorder, HTN (hypertension), Lymphedema, MS (multiple sclerosis) ([place]), and Pulmonary embolism ([place]).   Significant Hospital Events   10/8: Admitted, central line placed, culture sent, broad-spectrum antibiotics initiated.  Goals of care discussion was had by critical care team, limited [place] with no intubation or CPR 10/9 awake, still pressor dependent but improving.  Worsening anion gap metabolic acidosis with normal lactate and elevated glucose suggesting mild DKA  Consults:    Procedures:  R IJ CVC (ED) 10/8 >>   Significant Diagnostic Tests:  CXR 10/8 >> no infiltrates  Micro Data:  SARS CoV2 10/8 >> negative Blood 10/8 >>  Urine 10/8 >>    Antimicrobials:  Vanco 10/8 >>  Zosyn 10/8 >>    Interim history/subjective:   Feels better Objective   Blood pressure (Abnormal) 137/51, pulse 85, temperature (Abnormal) 97.4 F (36.3 C), temperature source Oral, resp. rate 14, height 5\' 3"  (1.6 m), weight 62.3 kg, SpO2 100 %. CVP:  [3 mmHg] 3 mmHg      Intake/Output Summary (Last 24 hours) at 03/28/2019 [id number] Last data filed at 03/28/2019 0700 Gross per 24 hour  Intake 3394.11 ml  Output 100 ml  Net 3294.11 ml   Filed Weights   03/28/19 0330  Weight: 62.3 kg    Examination: General this is a 63 year old black female she is resting comfortably in bed she appears chronically ill this morning she is in no acute distress however she  does remain on vasoactive drips HEENT normocephalic atraumatic no jugular venous distention is appreciated she does have a right internal jugular vein triple-lumen catheter dressing is intact Pulmonary: Clear to auscultation without accessory use Cardiac: Regular rate and rhythm without murmur rub or gallop Abdomen: Soft nontender bowel sounds are positive there is no organomegaly Neuro: Awake alert interactive appropriate Extremities upper extremity flexion contractures are noted lower extremities are contracted in dorsiflexion she has lower extremity edema, dressings are placed on stage I pressure ulcers on both ankles she has an unstageable ulcer on her sacrum GU clear concentrated yellow urine from care wick catheter   Resolved Hospital Problem list     Assessment & Plan:  Shock, likely septic shock plus component of hypovolemic shock.  Urinary tract seems most likely source Plan Continue maintenance IV fluids, I think she is losing some volume from DKA as well Continue empiric antibiotics as we await culture data Titrate norepinephrine for mean arterial pressure greater than 65 Add midodrine  Acute renal failure in the setting of organ hypoperfusion/MODS renal function slowly improving Plan Renal dose medications Avoid hypovolemia Strict intake and output Holding home diuretics and calcium channel blocker as well as Entresto  Anion gap metabolic acidosis with normal lactic acid And hyperglycemia consistent with diabetic ketoacidosis; has history of diabetes not currently on her insulin Plan Start DKA protocol Serial blood glucose checks  Anemia of chronic disease, history of GI blood loss: Hemoglobin is been stable thus far Plan Okay to continue Eliquis Transfuse for  hemoglobin less than 7  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 Plan Careful with her home muscle relaxant dosing     Best practice:  Diet: Changing to nothing by mouth with current diabetic ketoacidosis with exception of medications Pain/Anxiety/Delirium protocol (if indicated): N/A.  Hold home Xanax and Vicodin, Robaxin VAP protocol (if indicated): N/A DVT prophylaxis: Eliquis 2.5 mg twice daily GI prophylaxis: Home Protonix, Pepcid Glucose control: SSI per protocol Mobility: Bedrest, frequent turning Code Status: DNR, pressors OK Family Communication: updated sister Debora Disposition: ICU / Stepdown Remains critically ill due to ongoing septic shock, however it appears as though we can start to wean vasoactive drips more aggressively.  Unfortunately now has anion gap metabolic acidosis I think this is diabetic ketoacidosis her glucose is 280 her lactate is normal she is not currently on sliding scale coverage for now we will continue to work on weaning pressors continue volume replacement unfortunately she will need insulin drip  My critical care x33 minutes  Erick Colace ACNP-BC [place] Pager # [phone] OR # [phone] if no answer

## 2019-03-29 DIAGNOSIS — E872 Acidosis: Secondary | ICD-10-CM

## 2019-03-29 LAB — BASIC METABOLIC PANEL
Anion gap: 9 (ref 5–15)
Anion gap: 8 (ref 5–15)
Anion gap: 8 (ref 5–15)
BUN: 34 mg/dL — ABNORMAL HIGH (ref 8–23)
BUN: 31 mg/dL — ABNORMAL HIGH (ref 8–23)
BUN: 27 mg/dL — ABNORMAL HIGH (ref 8–23)
CO2: 13 mmol/L — ABNORMAL LOW (ref 22–32)
CO2: 13 mmol/L — ABNORMAL LOW (ref 22–32)
CO2: 13 mmol/L — ABNORMAL LOW (ref 22–32)
Calcium: 8.1 mg/dL — ABNORMAL LOW (ref 8.9–10.3)
Calcium: 8.1 mg/dL — ABNORMAL LOW (ref 8.9–10.3)
Calcium: 8.2 mg/dL — ABNORMAL LOW (ref 8.9–10.3)
Chloride: 112 mmol/L — ABNORMAL HIGH (ref 98–111)
Chloride: 112 mmol/L — ABNORMAL HIGH (ref 98–111)
Chloride: 112 mmol/L — ABNORMAL HIGH (ref 98–111)
Creatinine, Ser: 0.87 mg/dL (ref 0.44–1.00)
Creatinine, Ser: 0.91 mg/dL (ref 0.44–1.00)
Creatinine, Ser: 0.76 mg/dL (ref 0.44–1.00)
GFR calc Af Amer: >60 mL/min (ref >60)
GFR calc Af Amer: >60 mL/min (ref >60)
GFR calc Af Amer: >60 mL/min (ref >60)
GFR calc non Af Amer: >60 mL/min (ref >60)
GFR calc non Af Amer: >60 mL/min (ref >60)
GFR calc non Af Amer: >60 mL/min (ref >60)
Glucose, Bld: 151 mg/dL — ABNORMAL HIGH (ref 70–99)
Glucose, Bld: 193 mg/dL — ABNORMAL HIGH (ref 70–99)
Glucose, Bld: 158 mg/dL — ABNORMAL HIGH (ref 70–99)
Potassium: 3.8 mmol/L (ref 3.5–5.1)
Potassium: 3.5 mmol/L (ref 3.5–5.1)
Potassium: 3.6 mmol/L (ref 3.5–5.1)
Sodium: 134 mmol/L — ABNORMAL LOW (ref 135–145)
Sodium: 133 mmol/L — ABNORMAL LOW (ref 135–145)
Sodium: 133 mmol/L — ABNORMAL LOW (ref 135–145)

## 2019-03-29 LAB — GLUCOSE, CAPILLARY
Glucose-Capillary: 113 mg/dL — ABNORMAL HIGH (ref 70–99)
Glucose-Capillary: 130 mg/dL — ABNORMAL HIGH (ref 70–99)
Glucose-Capillary: 131 mg/dL — ABNORMAL HIGH (ref 70–99)
Glucose-Capillary: 133 mg/dL — ABNORMAL HIGH (ref 70–99)
Glucose-Capillary: 134 mg/dL — ABNORMAL HIGH (ref 70–99)
Glucose-Capillary: 145 mg/dL — ABNORMAL HIGH (ref 70–99)
Glucose-Capillary: 149 mg/dL — ABNORMAL HIGH (ref 70–99)
Glucose-Capillary: 155 mg/dL — ABNORMAL HIGH (ref 70–99)
Glucose-Capillary: 182 mg/dL — ABNORMAL HIGH (ref 70–99)
Glucose-Capillary: 200 mg/dL — ABNORMAL HIGH (ref 70–99)
Glucose-Capillary: 77 mg/dL (ref 70–99)
Glucose-Capillary: 94 mg/dL (ref 70–99)

## 2019-03-29 MED ORDER — INSULIN ASPART 100 UNIT/ML ~~LOC~~ SOLN: 0.0000 [IU] | Freq: Every day | SUBCUTANEOUS | Status: DC

## 2019-03-29 MED ORDER — INSULIN ASPART 100 UNIT/ML ~~LOC~~ SOLN
0.0000 [IU] | Freq: Three times a day (TID) | SUBCUTANEOUS | Status: DC
  Administered 2019-03-29 (×2): 2 [IU] via SUBCUTANEOUS
  Administered 2019-03-30 – 2019-03-31 (×3): 3 [IU] via SUBCUTANEOUS

## 2019-03-29 MED ORDER — DOCUSATE SODIUM 100 MG PO CAPS
100.0000 mg | ORAL_CAPSULE | Freq: Every day | ORAL | Status: DC
  Administered 2019-03-29 – 2019-03-31 (×3): 100 mg via ORAL
  Filled 2019-03-29 (×3): qty 1

## 2019-03-29 NOTE — Progress Notes (Signed)
PHARMACY NOTE -  Cefepime  Pharmacy has been assisting with dosing of Cefepime for sepsis with suspected urinary source. Dosage remains stable at Cefepime 2gm IV q12h and need for further dosage adjustment appears unlikely at present.    Will sign off at this time.  Please reconsult if a change in clinical status warrants re-evaluation of dosage.  Netta Cedars, PharmD, BCPS 03/29/2019@9 :05 AM

## 2019-03-29 NOTE — Progress Notes (Signed)
NAME:  Jocelyn Sanchez, MRN:  [id number], DOB:  12-22-1955, LOS: 2 ADMISSION DATE:  03/27/2019,  CHIEF COMPLAINT:  Hypotension    Brief History   63 year old female patient with debilitating multiple sclerosis diabetes and hypertension also has history of pulmonary emboli on chronic anticoagulation.  Admitted on 10/8 with working diagnosis of septic shock felt likely from urinary tract source and/or chronic sacral wound  Past Medical History   has a past medical history of Anemia, DM II (diabetes mellitus, type II), controlled ([place]), Gait disorder, HTN (hypertension), Lymphedema, MS (multiple sclerosis) ([place]), and Pulmonary embolism ([place]).   Significant Hospital Events   10/8: Admitted, central line placed, culture sent, broad-spectrum antibiotics initiated.  Goals of care discussion was had by critical care team, limited [place] with no intubation or CPR 10/9 awake, still pressor dependent but improving.  Worsening anion gap metabolic acidosis with normal lactate and elevated glucose suggesting mild DKA  Consults:    Procedures:  R IJ CVC (ED) 10/8 >>   Significant Diagnostic Tests:  CXR 10/8 >> no infiltrates  Micro Data:  SARS CoV2 10/8 >> negative Blood 10/8 >>  Urine 10/8 >> multiple morphologies   Antimicrobials:  Vanco 10/8 >> 10/10 Zosyn 10/8 >> 10/8 Cefepime 10/8 >>   Interim history/subjective:   Feels better Objective   Blood pressure (!) 111/59, pulse 95, temperature 97.6 F (36.4 C), temperature source Oral, resp. rate 17, height 5\' 3"  (1.6 m), weight 63.2 kg, SpO2 100 %. CVP:  [2 mmHg-8 mmHg] 8 mmHg      Intake/Output Summary (Last 24 hours) at 03/29/2019 0850 Last data filed at 03/29/2019 0700 Gross per 24 hour  Intake 2508.74 ml  Output 1250 ml  Net 1258.74 ml   Filed Weights   03/28/19 0330 03/29/19 0500  Weight: 62.3 kg 63.2 kg    Examination: General elderly ill-appearing woman, no distress HEENT normocephalic, oropharynx moist, no  lesions, right IJ CVC Pulmonary: Clear bilaterally, somewhat decreased at both bases Cardiac: Regular, no murmur Abdomen: Soft, nondistended with positive bowel sounds Neuro: Wakes easily, alert, interacts, answers questions appropriately.  She is got upper extremity flexion contractures, some foot drop bilaterally with contractures in the lower extremities, stage I pressure ulcers on both ankles   Resolved Hospital Problem list   Shock Acute renal failure Anion gap metabolic acidosis  Assessment & Plan:  Shock, likely septic shock plus component of hypovolemic shock.  Suspect urinary source, multiple morphologies noted on culture Plan Continue maintenance IV fluids as ordered Tailor antibiotics: Stop vancomycin 10/10, continue cefepime Midodrin added during this hospitalization, may discontinue if her hemodynamics improved going forward.  Her home antihypertensives are on hold  Acute renal failure in the setting of organ hypoperfusion/MODS resolved Plan Diuretics, calcium channel blocker, Entresto currently on hold.  Add back as blood pressure will allow  Anion gap metabolic acidosis with normal lactic acid, suspect mild DKA at presentation Chronic non-anion gap metabolic acidosis, etiology unclear.  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 Plan Careful with her home methocarbamol dosing to avoid hypotension  Constipation Plan  Add Colace 10/10    Best practice:  Diet: Okay to start diabetic  diet Pain/Anxiety/Delirium protocol (if indicated): N/A.  Hold home Xanax and Vicodin, Robaxin VAP protocol (if indicated): N/A DVT prophylaxis: Eliquis GI prophylaxis: Home Protonix, Pepcid Glucose control: SSI per protocol Mobility: Bedrest, frequent turning Code Status: DNR, pressors OK Family Communication: updated sister Debora at the time of admission Disposition: Stepdown   I will ask TRH to assume her care as of 10/11  Baltazar Apo, MD, PhD 03/29/2019, 8:59 AM [facility] [phone] or if no answer [phone]

## 2019-03-30 DIAGNOSIS — R319 Hematuria, unspecified: Secondary | ICD-10-CM

## 2019-03-30 DIAGNOSIS — E876 Hypokalemia: Secondary | ICD-10-CM

## 2019-03-30 DIAGNOSIS — N390 Urinary tract infection, site not specified: Secondary | ICD-10-CM

## 2019-03-30 DIAGNOSIS — L89153 Pressure ulcer of sacral region, stage 3: Secondary | ICD-10-CM

## 2019-03-30 LAB — GLUCOSE, CAPILLARY
Glucose-Capillary: 111 mg/dL — ABNORMAL HIGH (ref 70–99)
Glucose-Capillary: 166 mg/dL — ABNORMAL HIGH (ref 70–99)
Glucose-Capillary: 156 mg/dL — ABNORMAL HIGH (ref 70–99)
Glucose-Capillary: 146 mg/dL — ABNORMAL HIGH (ref 70–99)

## 2019-03-30 LAB — CBC
HCT: 26.8 % — ABNORMAL LOW (ref 36.0–46.0)
Hemoglobin: 8.4 g/dL — ABNORMAL LOW (ref 12.0–15.0)
MCH: 24.3 pg — ABNORMAL LOW (ref 26.0–34.0)
MCHC: 31.3 g/dL (ref 30.0–36.0)
MCV: 77.5 fL — ABNORMAL LOW (ref 80.0–100.0)
Platelets: 517 10*3/uL — ABNORMAL HIGH (ref 150–400)
RBC: 3.46 MIL/uL — ABNORMAL LOW (ref 3.87–5.11)
RDW: 25.5 % — ABNORMAL HIGH (ref 11.5–15.5)
WBC: 9.8 10*3/uL (ref 4.0–10.5)
nRBC: 0 % (ref 0.0–0.2)

## 2019-03-30 LAB — BASIC METABOLIC PANEL
Anion gap: 8 (ref 5–15)
BUN: 23 mg/dL (ref 8–23)
CO2: 14 mmol/L — ABNORMAL LOW (ref 22–32)
Calcium: 8.1 mg/dL — ABNORMAL LOW (ref 8.9–10.3)
Chloride: 113 mmol/L — ABNORMAL HIGH (ref 98–111)
Creatinine, Ser: 0.78 mg/dL (ref 0.44–1.00)
GFR calc Af Amer: >60 mL/min (ref >60)
GFR calc non Af Amer: >60 mL/min (ref >60)
Glucose, Bld: 128 mg/dL — ABNORMAL HIGH (ref 70–99)
Potassium: 3.4 mmol/L — ABNORMAL LOW (ref 3.5–5.1)
Sodium: 135 mmol/L (ref 135–145)

## 2019-03-30 LAB — MAGNESIUM: Magnesium: 1 mg/dL — ABNORMAL LOW (ref 1.7–2.4)

## 2019-03-30 LAB — PHOSPHORUS: Phosphorus: 2 mg/dL — ABNORMAL LOW (ref 2.5–4.6)

## 2019-03-30 MED ORDER — POTASSIUM CHLORIDE 10 MEQ/100ML IV SOLN
INTRAVENOUS | Status: AC
  Administered 2019-03-30: 10 meq via INTRAVENOUS
  Filled 2019-03-30: qty 100

## 2019-03-30 MED ORDER — MAGNESIUM SULFATE 50 % IJ SOLN
3.0000 g | Freq: Once | INTRAVENOUS | Status: AC
  Administered 2019-03-30: 3 g via INTRAVENOUS
  Filled 2019-03-30: qty 6

## 2019-03-30 MED ORDER — SODIUM CHLORIDE 0.9 % IV SOLN
1.0000 g | INTRAVENOUS | Status: DC
  Administered 2019-03-30: 1 g via INTRAVENOUS
  Filled 2019-03-30 (×2): qty 10

## 2019-03-30 MED ORDER — MIDODRINE HCL 5 MG PO TABS: 5.0000 mg | ORAL_TABLET | Freq: Every day | ORAL | Status: DC

## 2019-03-30 MED ORDER — POTASSIUM CHLORIDE 10 MEQ/100ML IV SOLN
10.0000 meq | INTRAVENOUS | Status: AC
  Administered 2019-03-30 (×4): 10 meq via INTRAVENOUS
  Filled 2019-03-30 (×4): qty 100

## 2019-03-30 MED ORDER — MIDODRINE HCL 5 MG PO TABS
5.0000 mg | ORAL_TABLET | Freq: Every day | ORAL | Status: DC | PRN
  Filled 2019-03-30: qty 1

## 2019-03-30 MED ORDER — OXYMETAZOLINE HCL 0.05 % NA SOLN
1.0000 | Freq: Two times a day (BID) | NASAL | Status: DC | PRN
  Administered 2019-03-30 – 2019-03-31 (×2): 1 via NASAL
  Filled 2019-03-30: qty 15

## 2019-03-30 NOTE — Progress Notes (Signed)
[facility] Progress Note [name] DOB: [birth year] MRN: [id number]   Date of Service  03/30/2019  HPI/Events of Note  Congested / stuffy nose  eICU Interventions  Nasal spray ordered        Margaretmary Lombard 03/30/2019, 2:38 AM

## 2019-03-30 NOTE — Progress Notes (Signed)
PROGRESS NOTE    Jocelyn Sanchez  [id number] DOB: 25-May-1956 DOA: 03/27/2019 PCP: Dixie Dials, MD   Brief Narrative:  63 year old with a history of debilitating multiple sclerosis, diabetes mellitus type 2, essential hypertension, lymphedema, pulmonary embolism on chronic anticoagulation, chronic sacral decubitus admitted for septic shock secondary to urinary tract infection.  Initially required pressors and then was weaned off   Assessment & Plan:   Active Problems:   Urinary tract infection with hematuria   Sepsis with acute renal failure (HCC)   Septic shock (HCC)   Pressure injury of sacral region, stage 3 (HCC)   Metabolic acidosis   Diabetic acidosis without coma (HCC)  Septic shock, improving - Unknown exact etiology.  Suspicion for urinary source -Cultures urine-blood-negative -COVID-19-negative - Blood pressure improved. Antihypertensives on hold -Midodrine changed to nightly -Discontinue cefepime, add Rocephin  Hypomagnesemia/hypokalemia -Repletion ordered.  Acute kidney failure secondary to shock, resolved.  Baseline 0.7. - Antihypertensives and nephrotoxic drugs on hold.  Mild diabetic ketoacidosis, resolved  Anemia of chronic disease - Low at baseline of 8.5.  No obvious evidence of bleeding.  History of pulmonary embolism -On Eliquis.  Multiple sclerosis -Hold home methocarbamol Xanax and Vicodin on hold  Constipation -Colace  DVT prophylaxis: Eliquis Code Status: DNR Family Communication: Sister at bedside Disposition Plan: Transition patient to telemetry.  Will monitor her blood pressures at least next 24 hours.  If stable will discharge her tomorrow.  Consultants:   Critical care  Procedures:   None  Antimicrobials:   Vancomycin stopped 10/20  Zosyn 10/80  Cefepime 10/8- stopped 10/11  Rocephin 10/11-   Subjective: Feels better than yesterday.  1 episode of hypotension overnight.  Sister at bedside  Review of Systems  Otherwise negative except as per HPI, including: General: Denies fever, chills, night sweats or unintended weight loss. Resp: Denies cough, wheezing, shortness of breath. Cardiac: Denies chest pain, palpitations, orthopnea, paroxysmal nocturnal dyspnea. GI: Denies abdominal pain, nausea, vomiting, diarrhea or constipation GU: Denies dysuria, frequency, hesitancy or incontinence MS: Denies muscle aches, joint pain or swelling Neuro: Denies headache, neurologic deficits (focal weakness, numbness, tingling), abnormal gait Psych: Denies anxiety, depression, SI/HI/AVH Skin: Denies new rashes or lesions ID: Denies sick contacts, exotic exposures, travel  Objective: Vitals:   03/30/19 0432 03/30/19 0500 03/30/19 0600 03/30/19 0620  BP:  120/66  128/66  Pulse:  91 94 99  Resp:  13 14 13   Temp: 98.1 F (36.7 C)     TempSrc: Oral     SpO2:  100% 100% 100%  Weight:  68.7 kg    Height:        Intake/Output Summary (Last 24 hours) at 03/30/2019 0801 Last data filed at 03/30/2019 0600 Gross per 24 hour  Intake 1305.66 ml  Output 1600 ml  Net -294.34 ml   Filed Weights   03/28/19 0330 03/29/19 0500 03/30/19 0500  Weight: 62.3 kg 63.2 kg 68.7 kg    Examination:  General exam: Appears calm and comfortable, dry mouth Respiratory system: Clear to auscultation. Respiratory effort normal. Cardiovascular system: S1 & S2 heard, RRR. No JVD, murmurs, rubs, gallops or clicks. No pedal edema. Gastrointestinal system: Abdomen is nondistended, soft and nontender. No organomegaly or masses felt. Normal bowel sounds heard. Central nervous system: Alert and oriented. No focal neurological deficits. Extremities: Paraplegia Skin: No rashes, lesions or ulcers Psychiatry: Judgement and insight appear normal. Mood & affect appropriate.     Data Reviewed:   CBC: Recent Labs  Lab 03/27/19 1248 03/28/19  0530  WBC 16.1* 27.3*  NEUTROABS 11.4*  --   HGB 8.7* 8.6*  HCT 28.5* 28.5*  MCV 80.5 80.7   PLT 576* 0000000*   Basic Metabolic Panel: Recent Labs  Lab 03/28/19 0530 03/28/19 0917  03/28/19 1708 03/28/19 2023 03/29/19 0117 03/29/19 0516 03/29/19 1658  NA 130* 130*   < > 133* 134* 134* 133* 133*  K 4.0 3.7   < > 4.0 3.8 3.8 3.5 3.6  CL 106 107   < > 111 113* 112* 112* 112*  CO2 8* 10*   < > 12* 12* 13* 13* 13*  GLUCOSE 280* 247*   < > 157* 127* 151* 193* 158*  BUN 46* 43*   < > 38* 32* 34* 31* 27*  CREATININE 1.41* 1.34*   < > 0.99 0.90 0.87 0.91 0.76  CALCIUM 8.3* 8.2*   < > 8.1* 8.1* 8.1* 8.1* 8.2*  MG 1.4* 1.3*  --   --   --   --   --   --   PHOS 3.6 3.1  --   --   --   --   --   --    < > = values in this interval not displayed.   GFR: Estimated Creatinine Clearance: 66.1 mL/min (by C-G formula based on SCr of 0.76 mg/dL). Liver Function Tests: Recent Labs  Lab 03/27/19 1248 03/28/19 0530  AST 9* 10*  ALT 9 10  ALKPHOS 100 87  BILITOT 0.7 0.8  PROT 8.6* 7.1  ALBUMIN 2.7* 2.1*   No results for input(s): LIPASE, AMYLASE in the last 168 hours. No results for input(s): AMMONIA in the last 168 hours. Coagulation Profile: No results for input(s): INR, PROTIME in the last 168 hours. Cardiac Enzymes: No results for input(s): CKTOTAL, CKMB, CKMBINDEX, TROPONINI in the last 168 hours. BNP (last 3 results) No results for input(s): PROBNP in the last 8760 hours. HbA1C: No results for input(s): HGBA1C in the last 72 hours. CBG: Recent Labs  Lab 03/29/19 0725 03/29/19 0838 03/29/19 1130 03/29/19 1655 03/29/19 2149  GLUCAP 155* 113* 149* 145* 131*   Lipid Profile: No results for input(s): CHOL, HDL, LDLCALC, TRIG, CHOLHDL, LDLDIRECT in the last 72 hours. Thyroid Function Tests: No results for input(s): TSH, T4TOTAL, FREET4, T3FREE, THYROIDAB in the last 72 hours. Anemia Panel: No results for input(s): VITAMINB12, FOLATE, FERRITIN, TIBC, IRON, RETICCTPCT in the last 72 hours. Sepsis Labs: Recent Labs  Lab 03/27/19 1242 03/28/19 0530  PROCALCITON  --   9.98  LATICACIDVEN 0.9 0.8    Recent Results (from the past 240 hour(s))  SARS Coronavirus 2 by RT PCR (hospital order, performed in [facility] hospital lab) Nasopharyngeal Nasopharyngeal Swab     Status: None   Collection Time: 03/27/19 12:42 PM   Specimen: Nasopharyngeal Swab  Result Value Ref Range Status   SARS Coronavirus 2 NEGATIVE NEGATIVE Final    Comment: (NOTE) If result is NEGATIVE SARS-CoV-2 target nucleic acids are NOT DETECTED. The SARS-CoV-2 RNA is generally detectable in upper and lower  respiratory specimens during the acute phase of infection. The lowest  concentration of SARS-CoV-2 viral copies this assay can detect is 250  copies / mL. A negative result does not preclude SARS-CoV-2 infection  and should not be used as the sole basis for treatment or other  patient management decisions.  A negative result may occur with  improper specimen collection / handling, submission of specimen other  than nasopharyngeal swab, presence of viral mutation(s) within  the  areas targeted by this assay, and inadequate number of viral copies  (<250 copies / mL). A negative result must be combined with clinical  observations, patient history, and epidemiological information. If result is POSITIVE SARS-CoV-2 target nucleic acids are DETECTED. The SARS-CoV-2 RNA is generally detectable in upper and lower  respiratory specimens dur ing the acute phase of infection.  Positive  results are indicative of active infection with SARS-CoV-2.  Clinical  correlation with patient history and other diagnostic information is  necessary to determine patient infection status.  Positive results do  not rule out bacterial infection or co-infection with other viruses. If result is PRESUMPTIVE POSTIVE SARS-CoV-2 nucleic acids MAY BE PRESENT.   A presumptive positive result was obtained on the submitted specimen  and confirmed on repeat testing.  While 2019 novel coronavirus  (SARS-CoV-2) nucleic  acids may be present in the submitted sample  additional confirmatory testing may be necessary for epidemiological  and / or clinical management purposes  to differentiate between  SARS-CoV-2 and other Sarbecovirus currently known to infect humans.  If clinically indicated additional testing with an alternate test  methodology [phone]) is advised. The SARS-CoV-2 RNA is generally  detectable in upper and lower respiratory sp ecimens during the acute  phase of infection. The expected result is Negative. Fact Sheet for Patients:  StrictlyIdeas.no Fact Sheet for Healthcare Providers: BankingDealers.co.za This test is not yet approved or cleared by the [place] FDA and has been authorized for detection and/or diagnosis of SARS-CoV-2 by FDA under an Emergency Use Authorization (EUA).  This EUA will remain in effect (meaning this test can be used) for the duration of the COVID-19 declaration under Section 564(b)(1) of the Act, 21 U.S.C. section 360bbb-3(b)(1), unless the authorization is terminated or revoked sooner. Performed at [facility], [place] [address], [place], [place] [address]   Blood culture (routine x 2)     Status: None (Preliminary result)   Collection Time: 03/27/19 12:49 PM   Specimen: BLOOD RIGHT ARM  Result Value Ref Range Status   Specimen Description   Final    BLOOD RIGHT ARM Performed at [facility] [address], [place], [place] [address]    Special Requests   Final    BOTTLES DRAWN AEROBIC AND ANAEROBIC Blood Culture adequate volume Performed at [facility] [address], [place], [place] [address]    Culture   Final    NO GROWTH 2 DAYS Performed at [facility] [address] [place], [place] [address]    Report Status PENDING  Incomplete  Urine culture     Status: None   Collection Time: 03/27/19  2:57 PM   Specimen: Urine, Random   Result Value Ref Range Status   Specimen Description   Final    URINE, RANDOM Performed at [facility] [address], [place], [place] [address]    Special Requests   Final    NONE Performed at [facility], [place] [address] [place], [place] [address]    Culture   Final    Multiple bacterial morphotypes present, none predominant. Suggest appropriate recollection if clinically indicated.   Report Status 03/28/2019 FINAL  Final  MRSA PCR Screening     Status: None   Collection Time: 03/28/19  3:28 AM   Specimen: Nasal Mucosa; Nasopharyngeal  Result Value Ref Range Status   MRSA by PCR NEGATIVE NEGATIVE Final    Comment:  The GeneXpert MRSA Assay (FDA approved for NASAL specimens only), is one component of a comprehensive MRSA colonization surveillance program. It is not intended to diagnose MRSA infection nor to guide or monitor treatment for MRSA infections. Performed at [facility], [place] [address], [place], [place] [address]          Radiology Studies: Dg Chest Port 1 View  Result Date: 03/28/2019 CLINICAL DATA:  Central line placement. Sepsis. Urinary tract infection. EXAM: PORTABLE CHEST 1 VIEW COMPARISON:  03/27/2019 FINDINGS: Central venous catheter tip is in the superior vena cava above the cavoatrial junction in good position, unchanged. Heart size and vascularity are normal. Aortic atherosclerosis. Lungs are clear. No effusions. No acute bone abnormality. IMPRESSION: 1. No acute abnormalities. Central venous catheter tip is in the superior vena cava in good position, unchanged. 2. Aortic atherosclerosis. Electronically Signed   By: Lorriane Shire M.D.   On: 03/28/2019 12:24        Scheduled Meds: . apixaban  5 mg Oral BID  . Chlorhexidine Gluconate Cloth  6 each Topical Daily  . docusate sodium  100 mg Oral Daily  . famotidine  20 mg Oral BID  . insulin aspart  0-15 Units Subcutaneous TID WC   . insulin aspart  0-5 Units Subcutaneous QHS  . midodrine  5 mg Oral Q8H  . pantoprazole  40 mg Oral Daily   Continuous Infusions: . ceFEPime (MAXIPIME) IV Stopped (03/30/19 0038)  . lactated ringers 50 mL/hr at 03/30/19 0617     LOS: 3 days   Time spent= 25 mins    Jalaiya Oyster Arsenio Loader, MD [facility]  If 7PM-7AM, please contact night-coverage [email or web address] 03/30/2019, 8:01 AM

## 2019-03-31 LAB — BASIC METABOLIC PANEL
Anion gap: 8 (ref 5–15)
BUN: 21 mg/dL (ref 8–23)
CO2: 14 mmol/L — ABNORMAL LOW (ref 22–32)
Calcium: 8.1 mg/dL — ABNORMAL LOW (ref 8.9–10.3)
Chloride: 113 mmol/L — ABNORMAL HIGH (ref 98–111)
Creatinine, Ser: 0.78 mg/dL (ref 0.44–1.00)
GFR calc Af Amer: >60 mL/min (ref >60)
GFR calc non Af Amer: >60 mL/min (ref >60)
Glucose, Bld: 136 mg/dL — ABNORMAL HIGH (ref 70–99)
Potassium: 3.7 mmol/L (ref 3.5–5.1)
Sodium: 135 mmol/L (ref 135–145)

## 2019-03-31 LAB — GLUCOSE, CAPILLARY
Glucose-Capillary: 39 mg/dL — CL (ref 70–99)
Glucose-Capillary: 119 mg/dL — ABNORMAL HIGH (ref 70–99)
Glucose-Capillary: 157 mg/dL — ABNORMAL HIGH (ref 70–99)

## 2019-03-31 LAB — MAGNESIUM: Magnesium: 1.6 mg/dL — ABNORMAL LOW (ref 1.7–2.4)

## 2019-03-31 MED ORDER — POTASSIUM CHLORIDE CRYS ER 20 MEQ PO TBCR
40.0000 meq | EXTENDED_RELEASE_TABLET | Freq: Once | ORAL | Status: AC
  Administered 2019-03-31: 40 meq via ORAL
  Filled 2019-03-31: qty 2

## 2019-03-31 MED ORDER — MAGNESIUM SULFATE 2 GM/50ML IV SOLN
2.0000 g | Freq: Once | INTRAVENOUS | Status: AC
  Administered 2019-03-31: 2 g via INTRAVENOUS
  Filled 2019-03-31: qty 50

## 2019-03-31 MED ORDER — CEPHALEXIN 500 MG PO CAPS: 500.0000 mg | ORAL_CAPSULE | Freq: Three times a day (TID) | ORAL | 0 refills | Status: AC

## 2019-03-31 NOTE — Progress Notes (Signed)
[facility] called for transportation and will have truck here within an hour. Pt's son Vicente Males was called to confirm address and made aware of pt being home within the hour.

## 2019-03-31 NOTE — Progress Notes (Signed)
Central line has been d/c per IV team. MD made aware of IV antibiotic dose not given prior to d/c IV. MD ok with patient being discharged and starting oral antibiotics at home.

## 2019-03-31 NOTE — Discharge Summary (Signed)
Physician Discharge Summary  Jocelyn Sanchez [id number] DOB: 1955-11-29 DOA: 03/27/2019  PCP: Dixie Dials, MD  Admit date: 03/27/2019 Discharge date: 03/31/2019  Admitted From home: Home Disposition: Home  Recommendations for Outpatient Follow-up:  1. Follow up with PCP in 1-2 weeks 2. Please obtain BMP/CBC in one week your next doctors visit.  3. Keflex for 7 days   Discharge Condition: Stable CODE STATUS: DNR Diet recommendation: Diabetic  Brief/Interim Summary: 63 year old with a history of debilitating multiple sclerosis, diabetes mellitus type 2, essential hypertension, lymphedema, pulmonary embolism on chronic anticoagulation, chronic sacral decubitus admitted for septic shock secondary to urinary tract infection.  Initially required pressors and then was weaned off.  Her cultures remain negative, empirically initially treated with vancomycin and cefepime later transitioned to Rocephin.  Remained hemodynamically stable, euvolemic without any complaints.  Wishes to go home.  She was given 7 more days of oral Keflex. Advised to follow-up outpatient PCP and also get lab work done in about 1 week.   Discharge Diagnoses:  Active Problems:   Urinary tract infection with hematuria   Sepsis with acute renal failure (HCC)   Septic shock (HCC)   Pressure injury of sacral region, stage 3 (HCC)   Metabolic acidosis   Diabetic acidosis without coma (HCC)   Septic shock, resolved - Unknown exact etiology.  Suspicion for urinary source -Cultures urine-blood-negative -COVID-19-negative - Blood pressure improved. - Discontinued cefepime, Rocephin.  Transition to empiric 7 more days of Keflex.  Hypomagnesemia/hypokalemia -Repleted prior to discharge  Acute kidney failure secondary to shock, resolved.  Baseline 0.7. - Resume home meds, outpatient lab work in 1 week  Mild diabetic ketoacidosis, resolved  Anemia of chronic disease - Low at baseline of 8.5.  No obvious  evidence of bleeding.  History of pulmonary embolism -On Eliquis.  Multiple sclerosis -Resume home medications  Constipation -Colace  Stage III sacral decubitus ulcer-seen by wound care  Consultations:  Wound care  Subjective: Feels much better, wishes to go home.  Discharge Exam: Vitals:   03/31/19 0609 03/31/19 1202  BP: 122/70 105/62  Pulse: 96 93  Resp: 18 16  Temp: 98.2 F (36.8 C) 98.7 F (37.1 C)  SpO2: 100% 100%   Vitals:   03/30/19 1719 03/30/19 2124 03/31/19 0609 03/31/19 1202  BP: 124/62 132/76 122/70 105/62  Pulse: 90 97 96 93  Resp: 16  18 16   Temp: 99 F (37.2 C) 97.6 F (36.4 C) 98.2 F (36.8 C) 98.7 F (37.1 C)  TempSrc: Oral Oral Oral Oral  SpO2: 100% 100% 100% 100%  Weight:   66.4 kg   Height:        General: Pt is alert, awake, not in acute distress Cardiovascular: RRR, S1/S2 +, no rubs, no gallops Respiratory: CTA bilaterally, no wheezing, no rhonchi Abdominal: Soft, NT, ND, bowel sounds + Extremities: no edema, no cyanosis Sacral decubitus ulcer stage III Right neck central line in place-to be removed prior to discharge  Discharge Instructions  Discharge Instructions    Diet - low sodium heart healthy   Complete by: As directed    Increase activity slowly   Complete by: As directed      Allergies as of 03/31/2019      Reactions   Sulfa Antibiotics Shortness Of Breath, Swelling      Medication List    STOP taking these medications   carvedilol 10 MG 24 hr capsule Commonly known as: COREG CR   pantoprazole 40 MG tablet Commonly known as: PROTONIX  TAKE these medications   acetaminophen 500 MG tablet Commonly known as: TYLENOL Take 1,000 mg by mouth every 6 (six) hours as needed for moderate pain.   carvedilol 6.25 MG tablet Commonly known as: COREG Take 6.25 mg by mouth 2 (two) times daily.   cephALEXin 500 MG capsule Commonly known as: KEFLEX Take 1 capsule (500 mg total) by mouth 3 (three) times  daily for 7 days. Notes to patient: Start today 03/31/2019   Eliquis 5 MG Tabs tablet Generic drug: apixaban Take 5 mg by mouth 2 (two) times daily.   Entresto 24-26 MG Generic drug: sacubitril-valsartan Take 1 tablet by mouth 2 (two) times daily.   famotidine 20 MG tablet Commonly known as: PEPCID Take 1 tablet (20 mg total) by mouth 2 (two) times daily.   furosemide 20 MG tablet Commonly known as: LASIX Take 20 mg by mouth daily.   HYDROcodone-acetaminophen 5-325 MG tablet Commonly known as: NORCO/VICODIN Take 2 tablets by mouth every 4 (four) hours as needed for moderate pain.   methocarbamol 500 MG tablet Commonly known as: ROBAXIN Take 0.5 tablets (250 mg total) by mouth every 8 (eight) hours as needed for muscle spasms.      Follow-up Information    Dixie Dials, MD. Schedule an appointment as soon as possible for a visit in 2 week(s).   Specialty: Cardiology Contact information: [place] [address] [phone]          Allergies  Allergen Reactions  . Sulfa Antibiotics Shortness Of Breath and Swelling    You were cared for by a hospitalist during your hospital stay. If you have any questions about your discharge medications or the care you received while you were in the hospital after you are discharged, you can call the unit and asked to speak with the hospitalist on call if the hospitalist that took care of you is not available. Once you are discharged, your primary care physician will handle any further medical issues. Please note that no refills for any discharge medications will be authorized once you are discharged, as it is imperative that you return to your primary care physician (or establish a relationship with a primary care physician if you do not have one) for your aftercare needs so that they can reassess your need for medications and monitor your lab values.   Procedures/Studies: Dg Chest Port 1 View  Result Date:  03/28/2019 CLINICAL DATA:  Central line placement. Sepsis. Urinary tract infection. EXAM: PORTABLE CHEST 1 VIEW COMPARISON:  03/27/2019 FINDINGS: Central venous catheter tip is in the superior vena cava above the cavoatrial junction in good position, unchanged. Heart size and vascularity are normal. Aortic atherosclerosis. Lungs are clear. No effusions. No acute bone abnormality. IMPRESSION: 1. No acute abnormalities. Central venous catheter tip is in the superior vena cava in good position, unchanged. 2. Aortic atherosclerosis. Electronically Signed   By: Lorriane Shire M.D.   On: 03/28/2019 12:24   Dg Chest Port 1 View  Result Date: 03/27/2019 CLINICAL DATA:  Status post central line placement. EXAM: PORTABLE CHEST 1 VIEW COMPARISON:  Chest radiograph 03/27/2019 FINDINGS: Monitoring leads overlie the patient. Stable cardiac and mediastinal contours. Low lung volumes. Bibasilar atelectasis. Interval insertion right IJ central venous catheter tip projecting over the superior vena cava. No pleural effusion or pneumothorax. IMPRESSION: New central venous catheter tip projects over the superior vena cava. Electronically Signed   By: Lovey Newcomer M.D.   On: 03/27/2019 18:10   Dg Chest  Portable 1 View  Result Date: 03/27/2019 CLINICAL DATA:  Hypotension EXAM: PORTABLE CHEST 1 VIEW COMPARISON:  02/10/2019 FINDINGS: The heart size and mediastinal contours are stable. Calcific aortic knob. Both lungs are clear. The visualized skeletal structures are unremarkable. IMPRESSION: No active disease. Electronically Signed   By: Davina Poke M.D.   On: 03/27/2019 13:51      The results of significant diagnostics from this hospitalization (including imaging, microbiology, ancillary and laboratory) are listed below for reference.     Microbiology: Recent Results (from the past 240 hour(s))  SARS Coronavirus 2 by RT PCR (hospital order, performed in [facility] hospital lab) Nasopharyngeal Nasopharyngeal Swab      Status: None   Collection Time: 03/27/19 12:42 PM   Specimen: Nasopharyngeal Swab  Result Value Ref Range Status   SARS Coronavirus 2 NEGATIVE NEGATIVE Final    Comment: (NOTE) If result is NEGATIVE SARS-CoV-2 target nucleic acids are NOT DETECTED. The SARS-CoV-2 RNA is generally detectable in upper and lower  respiratory specimens during the acute phase of infection. The lowest  concentration of SARS-CoV-2 viral copies this assay can detect is 250  copies / mL. A negative result does not preclude SARS-CoV-2 infection  and should not be used as the sole basis for treatment or other  patient management decisions.  A negative result may occur with  improper specimen collection / handling, submission of specimen other  than nasopharyngeal swab, presence of viral mutation(s) within the  areas targeted by this assay, and inadequate number of viral copies  (<250 copies / mL). A negative result must be combined with clinical  observations, patient history, and epidemiological information. If result is POSITIVE SARS-CoV-2 target nucleic acids are DETECTED. The SARS-CoV-2 RNA is generally detectable in upper and lower  respiratory specimens dur ing the acute phase of infection.  Positive  results are indicative of active infection with SARS-CoV-2.  Clinical  correlation with patient history and other diagnostic information is  necessary to determine patient infection status.  Positive results do  not rule out bacterial infection or co-infection with other viruses. If result is PRESUMPTIVE POSTIVE SARS-CoV-2 nucleic acids MAY BE PRESENT.   A presumptive positive result was obtained on the submitted specimen  and confirmed on repeat testing.  While 2019 novel coronavirus  (SARS-CoV-2) nucleic acids may be present in the submitted sample  additional confirmatory testing may be necessary for epidemiological  and / or clinical management purposes  to differentiate between  SARS-CoV-2 and other  Sarbecovirus currently known to infect humans.  If clinically indicated additional testing with an alternate test  methodology [phone]) is advised. The SARS-CoV-2 RNA is generally  detectable in upper and lower respiratory sp ecimens during the acute  phase of infection. The expected result is Negative. Fact Sheet for Patients:  StrictlyIdeas.no Fact Sheet for Healthcare Providers: BankingDealers.co.za This test is not yet approved or cleared by the [place] FDA and has been authorized for detection and/or diagnosis of SARS-CoV-2 by FDA under an Emergency Use Authorization (EUA).  This EUA will remain in effect (meaning this test can be used) for the duration of the COVID-19 declaration under Section 564(b)(1) of the Act, 21 U.S.C. section 360bbb-3(b)(1), unless the authorization is terminated or revoked sooner. Performed at [facility], [place] [address] [place], [place] [address]   Blood culture (routine x 2)     Status: None (Preliminary result)   Collection Time: 03/27/19 12:49 PM   Specimen: BLOOD RIGHT ARM  Result  Value Ref Range Status   Specimen Description   Final    BLOOD RIGHT ARM Performed at [place] [address], [place], [place] [address]    Special Requests   Final    BOTTLES DRAWN AEROBIC AND ANAEROBIC Blood Culture adequate volume Performed at [place] [address] [place], [place] [address]    Culture   Final    NO GROWTH 4 DAYS Performed at [facility], [place] [address] [place], [place] [address]    Report Status PENDING  Incomplete  Urine culture     Status: None   Collection Time: 03/27/19  2:57 PM   Specimen: Urine, Random  Result Value Ref Range Status   Specimen Description   Final    URINE, RANDOM Performed at [place] [address], [place], [place] [address]    Special Requests   Final     NONE Performed at [facility], [place] [address] [place], [place] [address]    Culture   Final    Multiple bacterial morphotypes present, none predominant. Suggest appropriate recollection if clinically indicated.   Report Status 03/28/2019 FINAL  Final  MRSA PCR Screening     Status: None   Collection Time: 03/28/19  3:28 AM   Specimen: Nasal Mucosa; Nasopharyngeal  Result Value Ref Range Status   MRSA by PCR NEGATIVE NEGATIVE Final    Comment:        The GeneXpert MRSA Assay (FDA approved for NASAL specimens only), is one component of a comprehensive MRSA colonization surveillance program. It is not intended to diagnose MRSA infection nor to guide or monitor treatment for MRSA infections. Performed at [facility], [place] [address] [place], [place] [address]      Labs: BNP (last 3 results) No results for input(s): BNP in the last 8760 hours. Basic Metabolic Panel: Recent Labs  Lab 03/28/19 0530 03/28/19 0917  03/29/19 0117 03/29/19 0516 03/29/19 1658 03/30/19 0908 03/31/19 0342  NA 130* 130*   < > 134* 133* 133* 135 135  K 4.0 3.7   < > 3.8 3.5 3.6 3.4* 3.7  CL 106 107   < > 112* 112* 112* 113* 113*  CO2 8* 10*   < > 13* 13* 13* 14* 14*  GLUCOSE 280* 247*   < > 151* 193* 158* 128* 136*  BUN 46* 43*   < > 34* 31* 27* 23 21  CREATININE 1.41* 1.34*   < > 0.87 0.91 0.76 0.78 0.78  CALCIUM 8.3* 8.2*   < > 8.1* 8.1* 8.2* 8.1* 8.1*  MG 1.4* 1.3*  --   --   --   --  1.0* 1.6*  PHOS 3.6 3.1  --   --   --   --  2.0*  --    < > = values in this interval not displayed.   Liver Function Tests: Recent Labs  Lab 03/27/19 1248 03/28/19 0530  AST 9* 10*  ALT 9 10  ALKPHOS 100 87  BILITOT 0.7 0.8  PROT 8.6* 7.1  ALBUMIN 2.7* 2.1*   No results for input(s): LIPASE, AMYLASE in the last 168 hours. No results for input(s): AMMONIA in the last 168 hours. CBC: Recent Labs  Lab 03/27/19 1248 03/28/19 0530 03/30/19 0908  WBC 16.1*  27.3* 9.8  NEUTROABS 11.4*  --   --   HGB 8.7* 8.6* 8.4*  HCT 28.5* 28.5* 26.8*  MCV 80.5  80.7 77.5*  PLT 576* 596* 517*   Cardiac Enzymes: No results for input(s): CKTOTAL, CKMB, CKMBINDEX, TROPONINI in the last 168 hours. BNP: Invalid input(s): POCBNP CBG: Recent Labs  Lab 03/30/19 1629 03/30/19 1630 03/30/19 2120 03/31/19 0732 03/31/19 1109  GLUCAP 39* 156* 146* 119* 157*   D-Dimer No results for input(s): DDIMER in the last 72 hours. Hgb A1c No results for input(s): HGBA1C in the last 72 hours. Lipid Profile No results for input(s): CHOL, HDL, LDLCALC, TRIG, CHOLHDL, LDLDIRECT in the last 72 hours. Thyroid function studies No results for input(s): TSH, T4TOTAL, T3FREE, THYROIDAB in the last 72 hours.  Invalid input(s): FREET3 Anemia work up No results for input(s): VITAMINB12, FOLATE, FERRITIN, TIBC, IRON, RETICCTPCT in the last 72 hours. Urinalysis    Component Value Date/Time   COLORURINE AMBER (A) 03/27/2019 1457   APPEARANCEUR TURBID (A) 03/27/2019 1457   LABSPEC 1.013 03/27/2019 1457   PHURINE 6.0 03/27/2019 1457   GLUCOSEU NEGATIVE 03/27/2019 1457   HGBUR LARGE (A) 03/27/2019 1457   BILIRUBINUR NEGATIVE 03/27/2019 1457   KETONESUR 5 (A) 03/27/2019 1457   PROTEINUR >=300 (A) 03/27/2019 1457   UROBILINOGEN 0.2 03/01/2015 1913   NITRITE NEGATIVE 03/27/2019 1457   LEUKOCYTESUR MODERATE (A) 03/27/2019 1457   Sepsis Labs Invalid input(s): PROCALCITONIN,  WBC,  LACTICIDVEN Microbiology Recent Results (from the past 240 hour(s))  SARS Coronavirus 2 by RT PCR (hospital order, performed in [place] hospital lab) Nasopharyngeal Nasopharyngeal Swab     Status: None   Collection Time: 03/27/19 12:42 PM   Specimen: Nasopharyngeal Swab  Result Value Ref Range Status   SARS Coronavirus 2 NEGATIVE NEGATIVE Final    Comment: (NOTE) If result is NEGATIVE SARS-CoV-2 target nucleic acids are NOT DETECTED. The SARS-CoV-2 RNA is generally detectable in upper and  lower  respiratory specimens during the acute phase of infection. The lowest  concentration of SARS-CoV-2 viral copies this assay can detect is 250  copies / mL. A negative result does not preclude SARS-CoV-2 infection  and should not be used as the sole basis for treatment or other  patient management decisions.  A negative result may occur with  improper specimen collection / handling, submission of specimen other  than nasopharyngeal swab, presence of viral mutation(s) within the  areas targeted by this assay, and inadequate number of viral copies  (<250 copies / mL). A negative result must be combined with clinical  observations, patient history, and epidemiological information. If result is POSITIVE SARS-CoV-2 target nucleic acids are DETECTED. The SARS-CoV-2 RNA is generally detectable in upper and lower  respiratory specimens dur ing the acute phase of infection.  Positive  results are indicative of active infection with SARS-CoV-2.  Clinical  correlation with patient history and other diagnostic information is  necessary to determine patient infection status.  Positive results do  not rule out bacterial infection or co-infection with other viruses. If result is PRESUMPTIVE POSTIVE SARS-CoV-2 nucleic acids MAY BE PRESENT.   A presumptive positive result was obtained on the submitted specimen  and confirmed on repeat testing.  While 2019 novel coronavirus  (SARS-CoV-2) nucleic acids may be present in the submitted sample  additional confirmatory testing may be necessary for epidemiological  and / or clinical management purposes  to differentiate between  SARS-CoV-2 and other Sarbecovirus currently known to infect humans.  If clinically indicated additional testing with an alternate test  methodology [phone]) is advised. The SARS-CoV-2 RNA is generally  detectable in upper and lower respiratory  sp ecimens during the acute  phase of infection. The expected result is  Negative. Fact Sheet for Patients:  StrictlyIdeas.no Fact Sheet for Healthcare Providers: BankingDealers.co.za This test is not yet approved or cleared by the [place] FDA and has been authorized for detection and/or diagnosis of SARS-CoV-2 by FDA under an Emergency Use Authorization (EUA).  This EUA will remain in effect (meaning this test can be used) for the duration of the COVID-19 declaration under Section 564(b)(1) of the Act, 21 U.S.C. section 360bbb-3(b)(1), unless the authorization is terminated or revoked sooner. Performed at [facility], [place] [address], [place], [place] [address]   Blood culture (routine x 2)     Status: None (Preliminary result)   Collection Time: 03/27/19 12:49 PM   Specimen: BLOOD RIGHT ARM  Result Value Ref Range Status   Specimen Description   Final    BLOOD RIGHT ARM Performed at [place] [address], [place], [place] [address]    Special Requests   Final    BOTTLES DRAWN AEROBIC AND ANAEROBIC Blood Culture adequate volume Performed at [place] [address] [place], [place] [address]    Culture   Final    NO GROWTH 4 DAYS Performed at [facility], [place] [address], [place], [place] [address]    Report Status PENDING  Incomplete  Urine culture     Status: None   Collection Time: 03/27/19  2:57 PM   Specimen: Urine, Random  Result Value Ref Range Status   Specimen Description   Final    URINE, RANDOM Performed at [place] [address], [place], [place] [address]    Special Requests   Final    NONE Performed at [facility], [place] [address], [place], [place] [address]    Culture   Final    Multiple bacterial morphotypes present, none predominant. Suggest appropriate recollection if clinically indicated.   Report Status 03/28/2019 FINAL  Final  MRSA PCR Screening      Status: None   Collection Time: 03/28/19  3:28 AM   Specimen: Nasal Mucosa; Nasopharyngeal  Result Value Ref Range Status   MRSA by PCR NEGATIVE NEGATIVE Final    Comment:        The GeneXpert MRSA Assay (FDA approved for NASAL specimens only), is one component of a comprehensive MRSA colonization surveillance program. It is not intended to diagnose MRSA infection nor to guide or monitor treatment for MRSA infections. Performed at [facility], [place] [address], [place], [place] [address]      Time coordinating discharge:  I have spent 35 minutes face to face with the patient and on the ward discussing the patients care, assessment, plan and disposition with other care givers. >50% of the time was devoted counseling the patient about the risks and benefits of treatment/Discharge disposition and coordinating care.   SIGNED:   Damita Lack, MD  [facility] 03/31/2019, 12:16 PM   If 7PM-7AM, please contact night-coverage [email or web address]

## 2019-03-31 NOTE — Care Management Important Message (Signed)
Important Message  Patient Details IM Letter given to Cookie McGibboney RN to present to the Patient Name: BRIGETTA HOLLENBACH MRN: [id number] Date of Birth: [birth year]   Medicare Important Message Given:  Yes     Kerin Salen 03/31/2019, 11:34 AM

## 2019-03-31 NOTE — Progress Notes (Addendum)
Patient gave nurse verbal consent to notify sister Letitia Libra to make her aware of pending discharge. Sister has been updated and made aware of discharge.

## 2019-04-01 LAB — CULTURE, BLOOD (ROUTINE X 2)
Culture: NO GROWTH
Special Requests: ADEQUATE

## 2019-07-26 ENCOUNTER — Inpatient Hospital Stay (HOSPITAL_COMMUNITY)
Admission: EM | Admit: 2019-07-26 | Discharge: 2019-08-03 | DRG: 871 | Disposition: A | Discharge status: Home Health | Payer: Medicare Other | Attending: Internal Medicine | Admitting: Internal Medicine

## 2019-07-26 ENCOUNTER — Other Ambulatory Visit: Payer: Self-pay

## 2019-07-26 ENCOUNTER — Encounter (HOSPITAL_COMMUNITY): Payer: Self-pay | Admitting: Emergency Medicine

## 2019-07-26 ENCOUNTER — Emergency Department (HOSPITAL_COMMUNITY): Payer: Medicare Other

## 2019-07-26 DIAGNOSIS — M879 Osteonecrosis, unspecified: Secondary | ICD-10-CM | POA: Diagnosis present

## 2019-07-26 DIAGNOSIS — E871 Hypo-osmolality and hyponatremia: Secondary | ICD-10-CM | POA: Diagnosis present

## 2019-07-26 DIAGNOSIS — I5022 Chronic systolic (congestive) heart failure: Secondary | ICD-10-CM | POA: Diagnosis present

## 2019-07-26 DIAGNOSIS — R5381 Other malaise: Secondary | ICD-10-CM | POA: Diagnosis present

## 2019-07-26 DIAGNOSIS — Z882 Allergy status to sulfonamides status: Secondary | ICD-10-CM

## 2019-07-26 DIAGNOSIS — N390 Urinary tract infection, site not specified: Secondary | ICD-10-CM | POA: Diagnosis present

## 2019-07-26 DIAGNOSIS — Z79899 Other long term (current) drug therapy: Secondary | ICD-10-CM

## 2019-07-26 DIAGNOSIS — E860 Dehydration: Secondary | ICD-10-CM | POA: Diagnosis present

## 2019-07-26 DIAGNOSIS — A419 Sepsis, unspecified organism: Principal | ICD-10-CM | POA: Diagnosis present

## 2019-07-26 DIAGNOSIS — E872 Acidosis: Secondary | ICD-10-CM | POA: Diagnosis present

## 2019-07-26 DIAGNOSIS — K31819 Angiodysplasia of stomach and duodenum without bleeding: Secondary | ICD-10-CM | POA: Diagnosis present

## 2019-07-26 DIAGNOSIS — R109 Unspecified abdominal pain: Secondary | ICD-10-CM | POA: Diagnosis not present

## 2019-07-26 DIAGNOSIS — R933 Abnormal findings on diagnostic imaging of other parts of digestive tract: Secondary | ICD-10-CM | POA: Diagnosis not present

## 2019-07-26 DIAGNOSIS — K859 Acute pancreatitis without necrosis or infection, unspecified: Secondary | ICD-10-CM | POA: Diagnosis present

## 2019-07-26 DIAGNOSIS — K5909 Other constipation: Secondary | ICD-10-CM | POA: Diagnosis present

## 2019-07-26 DIAGNOSIS — E876 Hypokalemia: Secondary | ICD-10-CM | POA: Diagnosis present

## 2019-07-26 DIAGNOSIS — Z7401 Bed confinement status: Secondary | ICD-10-CM

## 2019-07-26 DIAGNOSIS — K8500 Idiopathic acute pancreatitis without necrosis or infection: Secondary | ICD-10-CM | POA: Diagnosis not present

## 2019-07-26 DIAGNOSIS — E875 Hyperkalemia: Secondary | ICD-10-CM | POA: Diagnosis present

## 2019-07-26 DIAGNOSIS — D259 Leiomyoma of uterus, unspecified: Secondary | ICD-10-CM | POA: Diagnosis present

## 2019-07-26 DIAGNOSIS — D638 Anemia in other chronic diseases classified elsewhere: Secondary | ICD-10-CM | POA: Diagnosis present

## 2019-07-26 DIAGNOSIS — N179 Acute kidney failure, unspecified: Secondary | ICD-10-CM | POA: Diagnosis present

## 2019-07-26 DIAGNOSIS — Z20822 Contact with and (suspected) exposure to covid-19: Secondary | ICD-10-CM | POA: Diagnosis present

## 2019-07-26 DIAGNOSIS — K2100 Gastro-esophageal reflux disease with esophagitis, without bleeding: Secondary | ICD-10-CM | POA: Diagnosis present

## 2019-07-26 DIAGNOSIS — G35 Multiple sclerosis: Secondary | ICD-10-CM | POA: Diagnosis present

## 2019-07-26 DIAGNOSIS — E1151 Type 2 diabetes mellitus with diabetic peripheral angiopathy without gangrene: Secondary | ICD-10-CM | POA: Diagnosis present

## 2019-07-26 DIAGNOSIS — L89153 Pressure ulcer of sacral region, stage 3: Secondary | ICD-10-CM | POA: Diagnosis present

## 2019-07-26 DIAGNOSIS — Z86711 Personal history of pulmonary embolism: Secondary | ICD-10-CM

## 2019-07-26 DIAGNOSIS — Z560 Unemployment, unspecified: Secondary | ICD-10-CM

## 2019-07-26 DIAGNOSIS — K5289 Other specified noninfective gastroenteritis and colitis: Secondary | ICD-10-CM | POA: Diagnosis not present

## 2019-07-26 DIAGNOSIS — M7989 Other specified soft tissue disorders: Secondary | ICD-10-CM | POA: Diagnosis not present

## 2019-07-26 DIAGNOSIS — D72829 Elevated white blood cell count, unspecified: Secondary | ICD-10-CM | POA: Diagnosis not present

## 2019-07-26 DIAGNOSIS — I110 Hypertensive heart disease with heart failure: Secondary | ICD-10-CM | POA: Diagnosis present

## 2019-07-26 DIAGNOSIS — D62 Acute posthemorrhagic anemia: Secondary | ICD-10-CM | POA: Diagnosis present

## 2019-07-26 DIAGNOSIS — N200 Calculus of kidney: Secondary | ICD-10-CM | POA: Diagnosis present

## 2019-07-26 DIAGNOSIS — R6521 Severe sepsis with septic shock: Secondary | ICD-10-CM

## 2019-07-26 DIAGNOSIS — I509 Heart failure, unspecified: Secondary | ICD-10-CM

## 2019-07-26 DIAGNOSIS — I700 Atherosclerosis of aorta: Secondary | ICD-10-CM | POA: Diagnosis present

## 2019-07-26 DIAGNOSIS — G35D Multiple sclerosis, unspecified: Secondary | ICD-10-CM | POA: Diagnosis present

## 2019-07-26 DIAGNOSIS — Z87891 Personal history of nicotine dependence: Secondary | ICD-10-CM

## 2019-07-26 DIAGNOSIS — Z7901 Long term (current) use of anticoagulants: Secondary | ICD-10-CM

## 2019-07-26 DIAGNOSIS — Z820 Family history of epilepsy and other diseases of the nervous system: Secondary | ICD-10-CM

## 2019-07-26 LAB — LIPASE, BLOOD: Lipase: 152 U/L — ABNORMAL HIGH (ref 11–51)

## 2019-07-26 LAB — CBC WITH DIFFERENTIAL/PLATELET
Abs Immature Granulocytes: 0.05 10*3/uL (ref 0.00–0.07)
Basophils Absolute: 0 10*3/uL (ref 0.0–0.1)
Basophils Relative: 0 %
Eosinophils Absolute: 0.2 10*3/uL (ref 0.0–0.5)
Eosinophils Relative: 2 %
HCT: 24.7 % — ABNORMAL LOW (ref 36.0–46.0)
Hemoglobin: 7.7 g/dL — ABNORMAL LOW (ref 12.0–15.0)
Immature Granulocytes: 0 %
Lymphocytes Relative: 10 %
Lymphs Abs: 1.2 10*3/uL (ref 0.7–4.0)
MCH: 25.3 pg — ABNORMAL LOW (ref 26.0–34.0)
MCHC: 31.2 g/dL (ref 30.0–36.0)
MCV: 81.3 fL (ref 80.0–100.0)
Monocytes Absolute: 0.8 10*3/uL (ref 0.1–1.0)
Monocytes Relative: 7 %
Neutro Abs: 9.7 10*3/uL — ABNORMAL HIGH (ref 1.7–7.7)
Neutrophils Relative %: 81 %
Platelets: 545 10*3/uL — ABNORMAL HIGH (ref 150–400)
RBC: 3.04 MIL/uL — ABNORMAL LOW (ref 3.87–5.11)
RDW: 17.2 % — ABNORMAL HIGH (ref 11.5–15.5)
WBC: 12 10*3/uL — ABNORMAL HIGH (ref 4.0–10.5)
nRBC: 0 % (ref 0.0–0.2)

## 2019-07-26 LAB — URINALYSIS, ROUTINE W REFLEX MICROSCOPIC
Bilirubin Urine: NEGATIVE
Glucose, UA: NEGATIVE mg/dL
Ketones, ur: 5 mg/dL — AB
Nitrite: NEGATIVE
Protein, ur: 100 mg/dL — AB
RBC / HPF: >50 RBC/hpf — ABNORMAL HIGH (ref 0–5)
Specific Gravity, Urine: 1.015 (ref 1.005–1.030)
WBC, UA: >50 WBC/hpf — ABNORMAL HIGH (ref 0–5)
pH: 6 (ref 5.0–8.0)

## 2019-07-26 LAB — COMPREHENSIVE METABOLIC PANEL
ALT: 9 U/L (ref 0–44)
AST: 10 U/L — ABNORMAL LOW (ref 15–41)
Albumin: 3.3 g/dL — ABNORMAL LOW (ref 3.5–5.0)
Alkaline Phosphatase: 90 U/L (ref 38–126)
Anion gap: 14 (ref 5–15)
BUN: 100 mg/dL — ABNORMAL HIGH (ref 8–23)
CO2: 10 mmol/L — ABNORMAL LOW (ref 22–32)
Calcium: 8.7 mg/dL — ABNORMAL LOW (ref 8.9–10.3)
Chloride: 102 mmol/L (ref 98–111)
Creatinine, Ser: 4.65 mg/dL — ABNORMAL HIGH (ref 0.44–1.00)
GFR calc Af Amer: 11 mL/min — ABNORMAL LOW (ref >60)
GFR calc non Af Amer: 9 mL/min — ABNORMAL LOW (ref >60)
Glucose, Bld: 134 mg/dL — ABNORMAL HIGH (ref 70–99)
Potassium: 6.6 mmol/L (ref 3.5–5.1)
Sodium: 126 mmol/L — ABNORMAL LOW (ref 135–145)
Total Bilirubin: 1.2 mg/dL (ref 0.3–1.2)
Total Protein: 8.1 g/dL (ref 6.5–8.1)

## 2019-07-26 LAB — RESPIRATORY PANEL BY RT PCR (FLU A&B, COVID)
Influenza A by PCR: NEGATIVE
Influenza B by PCR: NEGATIVE
SARS Coronavirus 2 by RT PCR: NEGATIVE

## 2019-07-26 LAB — CBG MONITORING, ED: Glucose-Capillary: 112 mg/dL — ABNORMAL HIGH (ref 70–99)

## 2019-07-26 LAB — LACTIC ACID, PLASMA: Lactic Acid, Venous: 0.8 mmol/L (ref 0.5–1.9)

## 2019-07-26 MED ORDER — FENTANYL CITRATE (PF) 100 MCG/2ML IJ SOLN
75.0000 ug | Freq: Once | INTRAMUSCULAR | Status: AC
  Administered 2019-07-26: 75 ug via INTRAVENOUS
  Filled 2019-07-26: qty 2

## 2019-07-26 MED ORDER — ONDANSETRON HCL 4 MG/2ML IJ SOLN
4.0000 mg | Freq: Once | INTRAMUSCULAR | Status: AC
  Administered 2019-07-26: 4 mg via INTRAVENOUS
  Filled 2019-07-26: qty 2

## 2019-07-26 MED ORDER — SODIUM CHLORIDE 0.9 % IV SOLN: INTRAVENOUS | Status: DC

## 2019-07-26 MED ORDER — SODIUM CHLORIDE 0.9 % IV BOLUS
1000.0000 mL | Freq: Once | INTRAVENOUS | Status: AC
  Administered 2019-07-26: 1000 mL via INTRAVENOUS

## 2019-07-26 MED ORDER — VANCOMYCIN HCL IN DEXTROSE 1-5 GM/200ML-% IV SOLN
1000.0000 mg | Freq: Once | INTRAVENOUS | Status: AC
  Administered 2019-07-26: 1000 mg via INTRAVENOUS
  Filled 2019-07-26: qty 200

## 2019-07-26 MED ORDER — SODIUM BICARBONATE-DEXTROSE 150-5 MEQ/L-% IV SOLN
150.0000 meq | INTRAVENOUS | Status: DC
  Administered 2019-07-26 – 2019-07-28 (×4): 150 meq via INTRAVENOUS
  Filled 2019-07-26 (×5): qty 1000

## 2019-07-26 MED ORDER — PIPERACILLIN-TAZOBACTAM IN DEX 2-0.25 GM/50ML IV SOLN
2.2500 g | Freq: Four times a day (QID) | INTRAVENOUS | Status: DC
  Administered 2019-07-27 (×2): 2.25 g via INTRAVENOUS
  Filled 2019-07-26 (×3): qty 50

## 2019-07-26 MED ORDER — VANCOMYCIN HCL 500 MG/100ML IV SOLN
500.0000 mg | Freq: Once | INTRAVENOUS | Status: AC
  Administered 2019-07-27: 500 mg via INTRAVENOUS
  Filled 2019-07-26: qty 100

## 2019-07-26 MED ORDER — ACETAMINOPHEN 500 MG PO TABS
1000.0000 mg | ORAL_TABLET | Freq: Four times a day (QID) | ORAL | Status: DC | PRN
  Administered 2019-07-26 – 2019-08-03 (×4): 1000 mg via ORAL
  Filled 2019-07-26 (×5): qty 2

## 2019-07-26 MED ORDER — PIPERACILLIN-TAZOBACTAM 3.375 G IVPB
3.3750 g | Freq: Three times a day (TID) | INTRAVENOUS | Status: DC
  Filled 2019-07-26: qty 50

## 2019-07-26 MED ORDER — FAMOTIDINE 20 MG PO TABS
20.0000 mg | ORAL_TABLET | Freq: Two times a day (BID) | ORAL | Status: DC
  Administered 2019-07-26 – 2019-07-27 (×2): 20 mg via ORAL
  Filled 2019-07-26 (×2): qty 1

## 2019-07-26 MED ORDER — SODIUM CHLORIDE 0.9 % IV SOLN
1.0000 g | Freq: Once | INTRAVENOUS | Status: DC
  Filled 2019-07-26: qty 10

## 2019-07-26 MED ORDER — ONDANSETRON HCL 4 MG PO TABS
4.0000 mg | ORAL_TABLET | Freq: Four times a day (QID) | ORAL | Status: DC | PRN
  Administered 2019-08-03: 4 mg via ORAL
  Filled 2019-07-26: qty 1

## 2019-07-26 MED ORDER — SODIUM BICARBONATE 8.4 % IV SOLN
Freq: Once | INTRAVENOUS | Status: AC
  Filled 2019-07-26 (×2): qty 100

## 2019-07-26 MED ORDER — INSULIN ASPART 100 UNIT/ML IV SOLN
5.0000 [IU] | Freq: Once | INTRAVENOUS | Status: AC
  Administered 2019-07-26: 5 [IU] via INTRAVENOUS
  Filled 2019-07-26: qty 0.05

## 2019-07-26 MED ORDER — CALCIUM GLUCONATE-NACL 1-0.675 GM/50ML-% IV SOLN
1.0000 g | Freq: Once | INTRAVENOUS | Status: AC
  Administered 2019-07-26: 1000 mg via INTRAVENOUS
  Filled 2019-07-26: qty 50

## 2019-07-26 MED ORDER — SODIUM CHLORIDE 0.9 % IV BOLUS: 500.0000 mL | Freq: Once | INTRAVENOUS | Status: DC

## 2019-07-26 MED ORDER — APIXABAN 5 MG PO TABS
5.0000 mg | ORAL_TABLET | Freq: Two times a day (BID) | ORAL | Status: DC
  Administered 2019-07-26 – 2019-08-03 (×15): 5 mg via ORAL
  Filled 2019-07-26 (×16): qty 1

## 2019-07-26 MED ORDER — DEXTROSE 50 % IV SOLN
1.0000 | Freq: Once | INTRAVENOUS | Status: AC
  Administered 2019-07-26: 50 mL via INTRAVENOUS
  Filled 2019-07-26: qty 50

## 2019-07-26 MED ORDER — SODIUM POLYSTYRENE SULFONATE 15 GM/60ML PO SUSP
30.0000 g | Freq: Once | ORAL | Status: AC
  Administered 2019-07-26: 30 g via ORAL
  Filled 2019-07-26: qty 120

## 2019-07-26 MED ORDER — PIPERACILLIN-TAZOBACTAM 3.375 G IVPB 30 MIN
3.3750 g | Freq: Once | INTRAVENOUS | Status: AC
  Administered 2019-07-26: 3.375 g via INTRAVENOUS
  Filled 2019-07-26: qty 50

## 2019-07-26 NOTE — ED Notes (Signed)
Date and time results received: 07/26/19 1453 (use smartphrase ".now" to insert current time)  Test: Potassium  Critical Value: 6.6  Name of Provider Notified: Messick MD  Orders Received? Or Actions Taken?:

## 2019-07-26 NOTE — ED Notes (Signed)
Unable to obtain a second blood culture prior to starting antibiotics.

## 2019-07-26 NOTE — ED Triage Notes (Signed)
The patient presents from home with difficulty having bowel movements and passing urine. Her abdomin is distended, tender and guarding. She also complains of back pain. Patient has chronically low blood pressures. She is bedridden and contracted. HX. MS   EMS vitals: 60 HR 18 Resp Rate 97.8 Temp 80 palpated BP

## 2019-07-26 NOTE — ED Notes (Signed)
Attempted to call report. Receiving RN will call back when they are available.

## 2019-07-26 NOTE — Progress Notes (Signed)
Notified provider of need to order antibiotics.   

## 2019-07-26 NOTE — Progress Notes (Signed)
PHARMACY NOTE:  ANTIMICROBIAL RENAL DOSAGE ADJUSTMENT  Current antimicrobial regimen includes a mismatch between antimicrobial dosage and estimated renal function.  As per policy approved by the Pharmacy & Therapeutics and Medical Executive Committees, the antimicrobial dosage will be adjusted accordingly.  Current antimicrobial dosage:  Zosyn 3.375 Gm IV q8h EI  Indication: sepsis  Renal Function:  Estimated Creatinine Clearance: 11.2 mL/min (A) (by C-G formula based on SCr of 4.65 mg/dL (H)). []      On intermittent HD, scheduled: []      On CRRT    Antimicrobial dosage has been changed to:  Zosyn 2.25 Gm IV q6h Patient has received Vancomycin 1 gm x1 in ED and an additional 1 gm x1 was ordered for 2300. Based on her current Scr she does not need additional 1 Gm dose at this point. I will order a 500 mg = 1500 mg load. Random vancomycin level will need to be drawn to determine when she gets her next dose.  Pharmacy will be glad to dose/order vancomycin levels. Please place a Pharmacy consult to dose vancomycin   Additional comments:   Thank you for allowing pharmacy to be a part of this patient's care.  Dorrene German, [facility] 07/26/2019 11:23 PM

## 2019-07-26 NOTE — H&P (Signed)
History and Physical  Jocelyn Sanchez [id number] DOB: 03/15/1956 DOA: 07/26/2019  Referring physician: Dr. Virgel Manifold PCP: Dixie Dials, MD  Outpatient Specialists:  Patient coming from: Home & is able to ambulate stretcher  Chief Complaint: Abdominal pain back pain constipation  HPI: Jocelyn Sanchez is a 63 y.o. female with medical history significant for anemia type 2 diabetes mellitus, gait disorder, hypertension, GI bleed gastric and duodenal angiodysplasia, lymphedema, multiple sclerosis, history of pulmonary embolism on Eliquis, who presented to the emergency department with complaints of constipation associated with abdominal pain.  She had not had bowel movement for 48 hours and was feeling uncomfortable she also has complaint of decreased urinary output over the last 24 hours.  She also does have a history of constipation got worse with abdominal pain she decided to come and seek evaluation and treatment.  She denies fever nausea or vomiting  ED Course: Patient was found to be hypotensive, potassium was 6.6 sodium 126 white count 12.0 hemoglobin 7.7 and lactic acid was however normal, she was also found to be dehydrated.  Blood cultures were obtained and are pending she was diagnosed to have sepsis and code sepsis was activated received 2 boluses of IV fluid and broad-spectrum antibiotics started for possible early sepsis.  She was also found to have elevated creatinine with acute increase kidney injury in addition to the hyperkalemia.  EDMD consulted with nephrology who advised to start her on bicarb infusion and hydration overnight.  Review of Systems: Patient seen  . Pt complains of constipation.  Pt denies any chest pain fever chills nausea vomiting.  Review of systems are otherwise negative   Past Medical History:  Diagnosis Date  . Anemia   . DM II (diabetes mellitus, type II), controlled ([place])   . Gait disorder   . HTN (hypertension)   . Lymphedema   . MS (multiple  sclerosis) ([place])   . Pulmonary embolism [facility])    Past Surgical History:  Procedure Laterality Date  . ESOPHAGOGASTRODUODENOSCOPY N/A 07/12/2016   Procedure: ESOPHAGOGASTRODUODENOSCOPY (EGD);  Surgeon: Irene Shipper, MD;  Location: [facility];  Service: Endoscopy;  Laterality: N/A;  . ESOPHAGOGASTRODUODENOSCOPY N/A 02/11/2019   Procedure: ESOPHAGOGASTRODUODENOSCOPY (EGD);  Surgeon: Carol Ada, MD;  Location: [facility];  Service: Endoscopy;  Laterality: N/A;  . FLEXIBLE SIGMOIDOSCOPY Left 03/09/2015   Procedure: FLEXIBLE SIGMOIDOSCOPY;  Surgeon: Carol Ada, MD;  Location: [facility];  Service: Endoscopy;  Laterality: Left;  . HOT HEMOSTASIS N/A 02/11/2019   Procedure: HOT HEMOSTASIS (ARGON PLASMA COAGULATION/BICAP);  Surgeon: Carol Ada, MD;  Location: [facility];  Service: Endoscopy;  Laterality: N/A;    Social History:  reports that she has quit smoking. Her smoking use included cigarettes. She has never used smokeless tobacco. She reports that she does not drink alcohol or use drugs.   Allergies  Allergen Reactions  . Sulfa Antibiotics Shortness Of Breath and Swelling    Family History  Problem Relation Age of Onset  . Multiple sclerosis Mother       Prior to Admission medications   Medication Sig Start Date End Date Taking? Authorizing Provider  acetaminophen (TYLENOL) 500 MG tablet Take 1,000 mg by mouth every 6 (six) hours as needed for moderate pain.   Yes [provider]  carvedilol (COREG) 6.25 MG tablet Take 6.25 mg by mouth 2 (two) times daily. 12/21/18  Yes [provider]  cefdinir (OMNICEF) 300 MG capsule Take 300 mg by mouth every 12 (twelve) hours. 07/16/19  Yes [provider]  ELIQUIS 5 MG TABS tablet Take 5 mg by mouth 2 (two) times daily. 03/11/19  Yes [provider]  ENTRESTO 24-26 MG Take 1 tablet by mouth 2 (two) times daily. 03/13/19  Yes [provider]  famotidine (PEPCID) 20 MG tablet Take 1 tablet (20  mg total) by mouth 2 (two) times daily. 08/16/17 07/26/19 Yes Arrien, Jimmy Picket, MD  FARXIGA 5 MG TABS tablet Take 5 mg by mouth daily. 06/13/19  Yes [provider]  metFORMIN (GLUCOPHAGE) 500 MG tablet Take 500 mg by mouth 2 (two) times daily. 07/25/19  Yes [provider]  nitrofurantoin, macrocrystal-monohydrate, (MACROBID) 100 MG capsule Take 100 mg by mouth every 12 (twelve) hours. 07/16/19  Yes [provider]  ondansetron (ZOFRAN) 4 MG tablet Take 4 mg by mouth every 6 (six) hours as needed for nausea. 07/16/19  Yes [provider]  traZODone (DESYREL) 50 MG tablet Take 50 mg by mouth at bedtime. 06/27/19  Yes [provider]    Physical Exam: BP (!) 99/49   Pulse 77   Temp (!) 97.5 F (36.4 C) (Oral)   Resp 16   SpO2 100%   Exam:  . General: 64 y.o. year-old female well developed well nourished in no acute distress.  Alert and oriented x3. Marland Kitchen HEENT: Clear with no rhinorrhea decreased range of motion at the neck . Cardiovascular: Regular rate and rhythm with no rubs or gallops.  No thyromegaly or JVD noted.   Marland Kitchen Respiratory: Clear to auscultation with no wheezes or rales. Good inspiratory effort. . Abdomen: Soft nontender nondistended with normal bowel sounds x4 quadrants. . Musculoskeletal: 2+ lower extremity edema. 2/4 pulses in all 4 extremities. . Skin: No ulcerative lesions noted or rashes, . Psychiatry: Mood is appropriate for condition and setting           Labs on Admission:  Basic Metabolic Panel: Recent Labs  Lab 07/26/19 1306  NA 126*  K 6.6*  CL 102  CO2 10*  GLUCOSE 134*  BUN 100*  CREATININE 4.65*  CALCIUM 8.7*   Liver Function Tests: Recent Labs  Lab 07/26/19 1306  AST 10*  ALT 9  ALKPHOS 90  BILITOT 1.2  PROT 8.1  ALBUMIN 3.3*   Recent Labs  Lab 07/26/19 1306  LIPASE 152*   No results for input(s): AMMONIA in the last 168 hours. CBC: Recent Labs  Lab 07/26/19 1306  WBC 12.0*  NEUTROABS  9.7*  HGB 7.7*  HCT 24.7*  MCV 81.3  PLT 545*   Cardiac Enzymes: No results for input(s): CKTOTAL, CKMB, CKMBINDEX, TROPONINI in the last 168 hours.  BNP (last 3 results) No results for input(s): BNP in the last 8760 hours.  ProBNP (last 3 results) No results for input(s): PROBNP in the last 8760 hours.  CBG: Recent Labs  Lab 07/26/19 1357  GLUCAP 112*    Radiological Exams on Admission: CT ABDOMEN PELVIS WO CONTRAST  Result Date: 07/26/2019 CLINICAL DATA:  64 year old with multiple sclerosis which leaves her bed ridden, presenting with difficulty having bowel movements and difficulty urinating, abdominal distension, and abdominal tenderness. She has abdominal guarding on physical examination. Laboratory data indicate leukocytosis and hyperkalemia. EXAM: CT ABDOMEN AND PELVIS WITHOUT CONTRAST TECHNIQUE: Multidetector CT imaging of the abdomen and pelvis was performed following the standard protocol without IV contrast. COMPARISON:  08/14/2017 and earlier. FINDINGS: Lower chest: Minimal scarring in the LEFT LOWER LOBE posteriorly. Visualized lung bases otherwise clear. Asymmetric mild subpleural fat at the  LEFT base which mimics a pleural effusion. Heart size normal. Extensive three-vessel coronary atherosclerosis. No pericardial effusion. Hepatobiliary: Normal unenhanced appearance of the liver. Contracted gallbladder without evidence of calcified gallstones. No biliary ductal dilation. Pancreas: Edema/inflammation adjacent to the proximal body of the pancreas and adjacent to the uncinate. Allowing for the unenhanced technique, no evidence of pancreatic mass. Spleen: Normal unenhanced appearance. Adrenals/Urinary Tract: Normal appearing adrenal glands. Multiple BILATERAL renal calculi, including partial staghorn calculi, as noted previously. No obstructing ureteral calculus on either side. Chronic RIGHT ureteral dilation. Edema involving the UPPER pole of the LEFT kidney, as it has a  Hounsfield measurement of approximately 6 where as the remaining normal parenchyma has a Hounsfield measurement of approximately 30; this does not represent a simple cyst, however, as there was no cyst in this location on the prior CT. Urinary bladder decompressed and unremarkable. Stomach/Bowel: Stomach normal in appearance for the degree of distention. Normal-appearing small bowel. Mobile cecum positioned in the RIGHT mid abdomen. Large stool burden in the rectum, with circumferential rectal wall thickening/muscular hypertrophy. Normal colonic stool burden elsewhere. No focal abnormality involving the colon. Normal appendix in the RIGHT upper pelvis. Vascular/Lymphatic: Severe aortoiliofemoral atherosclerosis without evidence of aneurysm. Numerous normal sized retroperitoneal lymph nodes without evidence of a nodal mass. Reproductive: Enlarged uterus containing fibroids as noted previously. No adnexal masses. Other: None. Musculoskeletal: Generalized muscular atrophy as noted previously, presumably due to the patient's MS. Osseous demineralization. Sclerosis involving the femoral heads bilaterally without evidence of subchondral collapse. No acute findings. IMPRESSION: 1. Edema/inflammation adjacent to the proximal body of the pancreas and adjacent to the uncinate process of the pancreas, query acute pancreatitis. 2. Large stool burden in the rectum with circumferential rectal wall thickening/muscular hypertrophy, possibly indicating mild stercoral colitis. 3. Multiple BILATERAL renal calculi, including partial staghorn calculi. No obstructing ureteral calculus on either side. 4. Edema involving the UPPER pole of the LEFT kidney suspicious for pyelonephritis. Please correlate with urinalysis. 5. Enlarged uterus containing fibroids as noted previously. 6. 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 Aortic Atherosclerosis  (ICD10-I70.0). Electronically Signed   By: Evangeline Dakin M.D.   On: 07/26/2019 15:51    EKG: Independently reviewed.  Sinus rhythm left anterior fascicular block abnormal R wave progression early transition nonspecific T wave abnormality  Assessment/Plan Present on Admission: **None**   Active Problems:   * No active hospital problems. *   Septic shock patient is on broad-spectrum antibiotic blood cultures are still pending Abdominal pain  Urinary tract infection continue antibiotics   Constipation we will start sodium polystyrene to help with constipation as well as reduce her potassium level.  If she continues to be constipated sorbitol 7% might be ordered  Hyponatremia continue IV hydration with normal saline  Hyperkalemia we will give a dose of Kayexalate and monitor  Acute kidney injury.  Continue IV bicarb and hydration per nephrology.  History of pulmonary embolism continue Eliquis  Type 2 diabetes mellitus continue sliding scale   Severity of Illness: The appropriate patient status for this patient is INPATIENT. Inpatient status is judged to be reasonable and necessary in order to provide the required intensity of service to ensure the patient's safety. The patient's presenting symptoms, physical exam findings, and initial radiographic and laboratory data in the context of their chronic comorbidities is felt to place them at high risk for further clinical deterioration. Furthermore, it is not anticipated that the patient will be medically stable  for discharge from the hospital within 2 midnights of admission. The following factors support the patient status of inpatient.  Patient is septic with hypotension.   * I certify that at the point of admission it is my clinical judgment that the patient will require inpatient hospital care spanning beyond 2 midnights from the point of admission due to high intensity of service, high risk for further deterioration and high  frequency of surveillance required.*    DVT prophylaxis: Eliquis/SCD  Code Status: Full code personally confirmed with patient  Family Communication: None at bedside  Disposition Plan: Home when stable  Consults called: Nephrology Per EDMD  Admission status: Inpatient    Cristal Deer MD [facility] Pager [phone]  If 7PM-7AM, please contact night-coverage [email or web address] Password [email or web address]  07/26/2019, 5:30 PM

## 2019-07-26 NOTE — ED Provider Notes (Signed)
[facility] DEPT Provider Note   CSN: [id number] Arrival date & time: 07/26/19  1121     History Chief Complaint  Patient presents with  . Abdominal Pain  . Back Pain  . Constipation    Jocelyn Sanchez is a 64 y.o. female.  64 year old female with prior medical history as detailed below presents for evaluation of constipation and associate abdominal pain.  Patient reports no bowel movement in last 48 hours.  She also reports decreased urinary output over the last 24 hours.  She denies nausea or vomiting.  She denies fevers.  She reports a longstanding history of constipation although her symptoms today are worse than normal.    The history is provided by the patient and medical records.  Abdominal Pain Pain location:  Generalized Pain quality: aching and bloating   Pain radiates to:  Does not radiate Pain severity:  Mild Onset quality:  Gradual Duration:  2 days Timing:  Constant Progression:  Unchanged Relieved by:  Nothing Worsened by:  Nothing Associated symptoms: constipation   Back Pain Associated symptoms: abdominal pain   Constipation Associated symptoms: abdominal pain and back pain        Past Medical History:  Diagnosis Date  . Anemia   . DM II (diabetes mellitus, type II), controlled ([place])   . Gait disorder   . HTN (hypertension)   . Lymphedema   . MS (multiple sclerosis) ([place])   . Pulmonary embolism [facility])     Patient Active Problem List   Diagnosis Date Noted  . Metabolic acidosis   . Diabetic acidosis without coma ([place])   . GI bleed 02/10/2019  . Proteus mirabilis infection 08/14/2017  . Diarrhea 08/14/2017  . Bacteremia 08/14/2017  . Gastric and duodenal angiodysplasia   . Esophageal stricture   . Gastroesophageal reflux disease with esophagitis   . Hx of pulmonary embolus 10/26/2015  . PVD (peripheral vascular disease) ([place]) 07/04/2015  . Essential hypertension, benign 06/17/2015  . Decubitus ulcer of  ankle, stage 4 ([place]) 05/10/2015  . Fever 03/08/2015  . Septic shock ([place]) 03/06/2015  . Diabetes mellitus with peripheral vascular disease ([place]) 03/06/2015  . Leukocytosis 03/06/2015  . Anemia of chronic disease 03/06/2015  . Pressure injury of sacral region, stage 3 ([place]) 03/06/2015  . Lymphedema 03/06/2015  . Sepsis with acute renal failure ([place]) 03/01/2015  . Urinary tract infection with hematuria 11/28/2014  . Multiple sclerosis ([place]) 05/11/2011    Past Surgical History:  Procedure Laterality Date  . ESOPHAGOGASTRODUODENOSCOPY N/A 07/12/2016   Procedure: ESOPHAGOGASTRODUODENOSCOPY (EGD);  Surgeon: Irene Shipper, MD;  Location: [facility];  Service: Endoscopy;  Laterality: N/A;  . ESOPHAGOGASTRODUODENOSCOPY N/A 02/11/2019   Procedure: ESOPHAGOGASTRODUODENOSCOPY (EGD);  Surgeon: Carol Ada, MD;  Location: [facility];  Service: Endoscopy;  Laterality: N/A;  . FLEXIBLE SIGMOIDOSCOPY Left 03/09/2015   Procedure: FLEXIBLE SIGMOIDOSCOPY;  Surgeon: Carol Ada, MD;  Location: [facility];  Service: Endoscopy;  Laterality: Left;  . HOT HEMOSTASIS N/A 02/11/2019   Procedure: HOT HEMOSTASIS (ARGON PLASMA COAGULATION/BICAP);  Surgeon: Carol Ada, MD;  Location: [facility];  Service: Endoscopy;  Laterality: N/A;     OB History    Gravida  3   Para  3   Term  0   Preterm  0   AB  0   Living  0     SAB  0   TAB  0   Ectopic  0   Multiple  0   Live Births  Family History  Problem Relation Age of Onset  . Multiple sclerosis Mother     Social History   Tobacco Use  . Smoking status: Former Smoker    Types: Cigarettes  . Smokeless tobacco: Never Used  Substance Use Topics  . Alcohol use: No  . Drug use: No    Home Medications Prior to Admission medications   Medication Sig Start Date End Date Taking? Authorizing Provider  acetaminophen (TYLENOL) 500 MG tablet Take 1,000 mg by mouth every 6 (six) hours as needed for moderate pain.     [provider]  carvedilol (COREG) 6.25 MG tablet Take 6.25 mg by mouth 2 (two) times daily. 12/21/18   [provider]  ELIQUIS 5 MG TABS tablet Take 5 mg by mouth 2 (two) times daily. 03/11/19   [provider]  ENTRESTO 24-26 MG Take 1 tablet by mouth 2 (two) times daily. 03/13/19   [provider]  famotidine (PEPCID) 20 MG tablet Take 1 tablet (20 mg total) by mouth 2 (two) times daily. 08/16/17 03/27/19  Arrien, Jimmy Picket, MD  furosemide (LASIX) 20 MG tablet Take 20 mg by mouth daily.     [provider]  HYDROcodone-acetaminophen (NORCO/VICODIN) 5-325 MG per tablet Take 2 tablets by mouth every 4 (four) hours as needed for moderate pain. 03/12/15   Robbie Lis, MD  methocarbamol (ROBAXIN) 500 MG tablet Take 0.5 tablets (250 mg total) by mouth every 8 (eight) hours as needed for muscle spasms. 03/12/15   Robbie Lis, MD    Allergies    Sulfa antibiotics  Review of Systems   Review of Systems  Gastrointestinal: Positive for abdominal pain and constipation.  Musculoskeletal: Positive for back pain.  All other systems reviewed and are negative.   Physical Exam Updated Vital Signs BP (!) 82/48   Pulse 79   Temp (!) 97.5 F (36.4 C) (Oral)   Resp 20   SpO2 100%   Physical Exam Vitals and nursing note reviewed.  Constitutional:      General: She is not in acute distress.    Appearance: She is well-developed.  HENT:     Head: Normocephalic and atraumatic.  Eyes:     Conjunctiva/sclera: Conjunctivae normal.     Pupils: Pupils are equal, round, and reactive to light.  Cardiovascular:     Rate and Rhythm: Normal rate and regular rhythm.     Heart sounds: Normal heart sounds.  Pulmonary:     Effort: Pulmonary effort is normal. No respiratory distress.     Breath sounds: Normal breath sounds.  Abdominal:     General: There is no distension.     Palpations: Abdomen is soft.     Tenderness: There is generalized abdominal  tenderness.  Musculoskeletal:        General: No deformity. Normal range of motion.     Cervical back: Normal range of motion and neck supple.  Skin:    General: Skin is warm and dry.  Neurological:     Mental Status: She is alert and oriented to person, place, and time.     ED Results / Procedures / Treatments   Labs (all labs ordered are listed, but only abnormal results are displayed) Labs Reviewed  COMPREHENSIVE METABOLIC PANEL - Abnormal; Notable for the following components:      Result Value   Sodium 126 (*)    Potassium 6.6 (*)    CO2 10 (*)    Glucose, Bld 134 (*)  Creatinine, Ser 4.65 (*)    Calcium 8.7 (*)    Albumin 3.3 (*)    AST 10 (*)    GFR calc non Af Amer 9 (*)    GFR calc Af Amer 11 (*)    All other components within normal limits  CBC WITH DIFFERENTIAL/PLATELET - Abnormal; Notable for the following components:   WBC 12.0 (*)    RBC 3.04 (*)    Hemoglobin 7.7 (*)    HCT 24.7 (*)    MCH 25.3 (*)    RDW 17.2 (*)    Platelets 545 (*)    Neutro Abs 9.7 (*)    All other components within normal limits  CBG MONITORING, ED - Abnormal; Notable for the following components:   Glucose-Capillary 112 (*)    All other components within normal limits  CULTURE, BLOOD (ROUTINE X 2)  CULTURE, BLOOD (ROUTINE X 2)  LACTIC ACID, PLASMA  LIPASE, BLOOD  URINALYSIS, ROUTINE W REFLEX MICROSCOPIC  LACTIC ACID, PLASMA  TYPE AND SCREEN    EKG EKG Interpretation  Date/Time:  Saturday July 26 2019 12:26:30 EST Ventricular Rate:  79 PR Interval:    QRS Duration: 96 QT Interval:  370 QTC Calculation: 425 R Axis:   -56 Text Interpretation: Sinus rhythm Left anterior fascicular block Abnormal R-wave progression, early transition Nonspecific T abnormalities, lateral leads Confirmed by Dene Gentry [phone]) on 07/26/2019 12:39:23 PM   Radiology No results found.  Procedures Procedures (including critical care time)  Medications Ordered in ED Medications   sodium chloride 0.9 % bolus 1,000 mL (1,000 mLs Intravenous New Bag/Given 07/26/19 1320)    ED Course  I have reviewed the triage vital signs and the nursing notes.  Pertinent labs & imaging results that were available during my care of the patient were reviewed by me and considered in my medical decision making (see chart for details).    MDM Rules/Calculators/A&P                      MDM  Screen complete  CHANDLAR ECKELBERRY was evaluated in Emergency Department on 07/26/2019 for the symptoms described in the history of present illness. She was evaluated in the context of the global COVID-19 pandemic, which necessitated consideration that the patient might be at risk for infection with the SARS-CoV-2 virus that causes COVID-19. Institutional protocols and algorithms that pertain to the evaluation of patients at risk for COVID-19 are in a state of rapid change based on information released by regulatory bodies including the CDC and federal and state organizations. These policies and algorithms were followed during the patient's care in the ED.  Patient is presenting for evaluation of constipation and decreased urinary output.  Screening labs suggest significant dehydration.  Broad-spectrum antibiotics given for patient's initial hypotension and possible early sepsis.  CT AP pending along with disposition - signed out to Dr. Wilson Singer.   Final Clinical Impression(s) / ED Diagnoses Final diagnoses:  Abdominal pain, unspecified abdominal location    Rx / DC Orders ED Discharge Orders    None       Valarie Merino, MD 07/26/19 1520

## 2019-07-26 NOTE — Progress Notes (Signed)
A consult was received from an ED physician for vancomycin  per pharmacy dosing.  The patient's profile has been reviewed for ht/wt/allergies/indication/available labs.    A one time order has been placed for vancomycin 1000 mg IV x1.  Further antibiotics/pharmacy consults should be ordered by admitting physician if indicated.                       Thank you, Lynelle Doctor 07/26/2019  2:15 PM

## 2019-07-27 ENCOUNTER — Inpatient Hospital Stay: Payer: Self-pay

## 2019-07-27 ENCOUNTER — Inpatient Hospital Stay (HOSPITAL_COMMUNITY): Payer: Medicare Other

## 2019-07-27 LAB — CBC
HCT: 20.2 % — ABNORMAL LOW (ref 36.0–46.0)
Hemoglobin: 6.5 g/dL — CL (ref 12.0–15.0)
MCH: 25.1 pg — ABNORMAL LOW (ref 26.0–34.0)
MCHC: 32.2 g/dL (ref 30.0–36.0)
MCV: 78 fL — ABNORMAL LOW (ref 80.0–100.0)
Platelets: 451 10*3/uL — ABNORMAL HIGH (ref 150–400)
RBC: 2.59 MIL/uL — ABNORMAL LOW (ref 3.87–5.11)
RDW: 16.9 % — ABNORMAL HIGH (ref 11.5–15.5)
WBC: 9.7 10*3/uL (ref 4.0–10.5)
nRBC: 0 % (ref 0.0–0.2)

## 2019-07-27 LAB — COMPREHENSIVE METABOLIC PANEL
ALT: 7 U/L (ref 0–44)
AST: 13 U/L — ABNORMAL LOW (ref 15–41)
Albumin: 2.6 g/dL — ABNORMAL LOW (ref 3.5–5.0)
Alkaline Phosphatase: 63 U/L (ref 38–126)
Anion gap: 15 (ref 5–15)
BUN: 104 mg/dL — ABNORMAL HIGH (ref 8–23)
CO2: 13 mmol/L — ABNORMAL LOW (ref 22–32)
Calcium: 7.8 mg/dL — ABNORMAL LOW (ref 8.9–10.3)
Chloride: 107 mmol/L (ref 98–111)
Creatinine, Ser: 2.67 mg/dL — ABNORMAL HIGH (ref 0.44–1.00)
GFR calc Af Amer: 21 mL/min — ABNORMAL LOW (ref >60)
GFR calc non Af Amer: 18 mL/min — ABNORMAL LOW (ref >60)
Glucose, Bld: 357 mg/dL — ABNORMAL HIGH (ref 70–99)
Potassium: 3.5 mmol/L (ref 3.5–5.1)
Sodium: 135 mmol/L (ref 135–145)
Total Bilirubin: 0.7 mg/dL (ref 0.3–1.2)
Total Protein: 6.3 g/dL — ABNORMAL LOW (ref 6.5–8.1)

## 2019-07-27 LAB — GLUCOSE, CAPILLARY
Glucose-Capillary: 270 mg/dL — ABNORMAL HIGH (ref 70–99)
Glucose-Capillary: 143 mg/dL — ABNORMAL HIGH (ref 70–99)
Glucose-Capillary: 153 mg/dL — ABNORMAL HIGH (ref 70–99)

## 2019-07-27 LAB — HEMOGLOBIN AND HEMATOCRIT, BLOOD
HCT: 20.3 % — ABNORMAL LOW (ref 36.0–46.0)
HCT: 37.5 % (ref 36.0–46.0)
Hemoglobin: 6.6 g/dL — CL (ref 12.0–15.0)
Hemoglobin: 12.9 g/dL (ref 12.0–15.0)

## 2019-07-27 LAB — IRON AND TIBC
Iron: 19 ug/dL — ABNORMAL LOW (ref 28–170)
Saturation Ratios: 10 % — ABNORMAL LOW (ref 10.4–31.8)
TIBC: 185 ug/dL — ABNORMAL LOW (ref 250–450)
UIBC: 166 ug/dL

## 2019-07-27 LAB — PROTIME-INR
INR: 1.8 — ABNORMAL HIGH (ref 0.8–1.2)
Prothrombin Time: 21 seconds — ABNORMAL HIGH (ref 11.4–15.2)

## 2019-07-27 LAB — C DIFFICILE QUICK SCREEN W PCR REFLEX
C Diff antigen: POSITIVE — AB
C Diff toxin: NEGATIVE

## 2019-07-27 LAB — FERRITIN: Ferritin: 121 ng/mL (ref 11–307)

## 2019-07-27 LAB — PROCALCITONIN: Procalcitonin: 0.21 ng/mL

## 2019-07-27 LAB — LACTIC ACID, PLASMA: Lactic Acid, Venous: 3.6 mmol/L (ref 0.5–1.9)

## 2019-07-27 LAB — OCCULT BLOOD X 1 CARD TO LAB, STOOL: Fecal Occult Bld: POSITIVE — AB

## 2019-07-27 LAB — LIPASE, BLOOD: Lipase: 2747 U/L — ABNORMAL HIGH (ref 11–51)

## 2019-07-27 LAB — PREPARE RBC (CROSSMATCH)

## 2019-07-27 MED ORDER — LIP MEDEX EX OINT
TOPICAL_OINTMENT | CUTANEOUS | Status: DC | PRN
  Filled 2019-07-27: qty 7

## 2019-07-27 MED ORDER — VANCOMYCIN VARIABLE DOSE PER UNSTABLE RENAL FUNCTION (PHARMACIST DOSING): Status: DC

## 2019-07-27 MED ORDER — INSULIN ASPART 100 UNIT/ML ~~LOC~~ SOLN
0.0000 [IU] | Freq: Three times a day (TID) | SUBCUTANEOUS | Status: DC
  Administered 2019-07-27: 5 [IU] via SUBCUTANEOUS
  Administered 2019-07-27: 1 [IU] via SUBCUTANEOUS
  Administered 2019-07-28: 3 [IU] via SUBCUTANEOUS
  Administered 2019-07-28: 2 [IU] via SUBCUTANEOUS
  Administered 2019-07-28: 5 [IU] via SUBCUTANEOUS
  Administered 2019-07-29 – 2019-07-31 (×7): 1 [IU] via SUBCUTANEOUS
  Administered 2019-08-01 – 2019-08-02 (×2): 3 [IU] via SUBCUTANEOUS
  Administered 2019-08-02 – 2019-08-03 (×3): 1 [IU] via SUBCUTANEOUS

## 2019-07-27 MED ORDER — PIPERACILLIN-TAZOBACTAM 3.375 G IVPB
3.3750 g | Freq: Three times a day (TID) | INTRAVENOUS | Status: DC
  Administered 2019-07-27 – 2019-07-28 (×3): 3.375 g via INTRAVENOUS
  Filled 2019-07-27 (×3): qty 50

## 2019-07-27 MED ORDER — SODIUM CHLORIDE 0.9 % IV BOLUS
500.0000 mL | Freq: Once | INTRAVENOUS | Status: AC
  Administered 2019-07-27: 500 mL via INTRAVENOUS

## 2019-07-27 MED ORDER — SODIUM CHLORIDE 0.9% IV SOLUTION: Freq: Once | INTRAVENOUS | Status: AC

## 2019-07-27 MED ORDER — FAMOTIDINE 20 MG PO TABS
10.0000 mg | ORAL_TABLET | Freq: Every day | ORAL | Status: DC
  Administered 2019-07-28 – 2019-07-31 (×3): 10 mg via ORAL
  Filled 2019-07-27 (×4): qty 1

## 2019-07-27 NOTE — Progress Notes (Signed)
CRITICAL VALUE ALERT  Critical Value:  Lactic Acid 3.6  Date & Time Notied:  07/27/19, 0840  Provider Notified: MD Nevada Crane  Orders Received/Actions taken: Awaiting new orders

## 2019-07-27 NOTE — Progress Notes (Signed)
CRITICAL VALUE ALERT  Critical Value:  Hgb 6.5  Date & Time Notied:  07/27/19, [id number]  Provider Notified: MD Nevada Crane  Orders Received/Actions taken: Awaiting new orders

## 2019-07-27 NOTE — Progress Notes (Addendum)
PROGRESS NOTE  Jocelyn Sanchez [id number] DOB: 1956/04/04 DOA: 07/26/2019 PCP: Dixie Dials, MD  HPI/Recap of past 24 hours: Jocelyn Sanchez is a 64 y.o. female with medical history significant for chronic anemia, type 2 diabetes mellitus, gait disorder, hypertension, GI bleed, gastric and duodenal angiodysplasia, lymphedema, multiple sclerosis, history of pulmonary embolism on Eliquis, who presented to the emergency department with complaints of constipation associated with abdominal pain.  She had not had bowel movement for 48 hours and was feeling uncomfortable.  Also has complaint of decreased urinary output over the last 24 hours.   ED Course: K+ 6.6 sodium 126 white count 12.0 hemoglobin 7.7 and lactic acid was however normal, she was also found to be dehydrated.  Blood cultures were obtained and are pending she was diagnosed to have sepsis and code sepsis was activated received 2 boluses of IV fluid and broad-spectrum antibiotics started for possible early sepsis.  Elevated creatinine with acute kidney injury in addition to the hyperkalemia.  EDMD consulted with nephrology who advised to start her on bicarb infusion and hydration overnight.  07/27/19: Seen and examined.  Uncomfortable due to diffuse abdominal pain.  Positive diarrhea.  Elevated lactic acid.  Hemoglobin dropped to 6.6, confirmed with repeated H&H.  Obtain FOBT and C. difficile PCR.  Assessment/Plan: Principal Problem:   Septic shock (HCC) Active Problems:   Multiple sclerosis (HCC)   Diabetes mellitus with peripheral vascular disease (HCC)   Leukocytosis   Sepsis ([place])  Severe sepsis likely multifactorial secondary to UTI with suspected intra-abdominal infection Presented with leukocytosis, tachycardia, diffuse abdominal pain, hypotension, now elevated lactic acid 3.6. CT abdomen and pelvis without contrast showed suspected acute pancreatitis.  Large stool burden in the rectum with circumferential rectal wall  thickening muscular hypertrophy possibly indicating mild stercoral colitis, suspected left pyelonephritis. Positive UA, urine culture and MRSA screening ordered and pending. Started on IV Zosyn and IV vancomycin empirically, continue Blood culture negative to date, continue to follow cultures. Monitor fever curve and WBC Daily CBCs  Acute pancreatitis Elevated lipase >170 Repeat lipase level >2700 NPO Gentle IV fluid in the setting of HFREF No evidence of gallstone or biliary duct dilatation on CT Slight inflammation adjacent to the proximal body of the pancreas and adjacent to the uncinate process of the pancreas on CT abdomen and pelvis No evidence of pancreatic mass  Presumed UTI with suspected left pyelonephritis Positive UA for pyuria Urine culture ordered and pending Continue empiric antibiotic  Chronic constipation now with diarrhea Rule out c-diff Avoid rehydration and monitor electrolytes  AKI likely prerenal in the setting of poor oral intake and dehydration Hypovolemic on exam Presented with creatinine of 4.65 Baseline creatinine 0.7 with GFR greater than 60 Creatinine is now trending down to 2.67. Avoid nephrotoxins and hypotension Closely monitor urine output Daily BMPs.  Multiple bilateral renal calculi including partial staghorn calculi on CT abdomen and pelvis Monitor urine output and symptomatology  Hyperkalemia likely in the setting of acute kidney injury Presented with potassium of 6.6 Potassium this morning 3.5  Anion gap metabolic acidosis likely secondary to acute renal failure Presented with serum bicarb of 10 and anion gap of 14 Currently on bicarb drip with improvement of acidosis Continue to monitor  Acute blood loss, unclear etiology Obtain FOBT Obtain iron studies Hemoglobin dropped to 6.6, this was confirmed with repeated H&H. 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 Incidental finding Monitor for symptoms  Physical debility Once stable PT and OT will assess Fall precautions  Chronic systolic CHF with LVEF [id number] Last 2D echo done on 08/09/2017 showed LVEF 4045% with diffuse hypokinesis Closely monitor volume status while on IV fluid to avoid complications such as pulmonary edema or bilateral pleural effusions Strict I's and O's and daily weight  History of PE Stable Continue Eliquis  Type 2 diabetes Obtain A1c Insulin sliding scale   DVT prophylaxis: Eliquis/SCD  Code Status: Full code personally confirmed with patient  Family Communication: None at bedside  Disposition Plan:  Patient is from home.  Anticipate discharge to home in the next 48 to 72 hours.  Barrier to discharge: Sepsis with ongoing IV antibiotics treatment.  Consults called: Nephrology Per EDMD    Objective: Vitals:   07/26/19 2300 07/27/19 0202 07/27/19 0408 07/27/19 0849  BP:  (!) 73/49 (!) 85/50 (!) 115/58  Pulse:  83 71 (!) 109  Resp:  17 17 18   Temp:  (!) 97.4 F (36.3 C) 97.7 F (36.5 C) 98 F (36.7 C)  TempSrc:    Oral  SpO2:  100% 100% 100%  Weight: 66.4 kg       Intake/Output Summary (Last 24 hours) at 07/27/2019 0933 Last data filed at 07/27/2019 0600 Gross per 24 hour  Intake 3236.72 ml  Output 30 ml  Net 3206.72 ml   Filed Weights   07/26/19 2300  Weight: 66.4 kg    Exam:  . General: 64 y.o. year-old female well developed well nourished in no acute distress.  Alert and interactive. . Cardiovascular: Regular rate and rhythm with no rubs or gallops.  No thyromegaly or JVD noted.   Marland Kitchen Respiratory: Clear to auscultation with no wheezes or rales. Good inspiratory effort. . Abdomen: Diffuse tenderness with hyperactive bowel sounds. . Musculoskeletal: Trace lower extremity edema. Marland Kitchen Psychiatry: Mood is appropriate for condition and  setting   Data Reviewed: CBC: Recent Labs  Lab 07/26/19 1306 07/27/19 0710 07/27/19 0753  WBC 12.0* 9.7  --   NEUTROABS 9.7*  --   --   HGB 7.7* 6.5* 6.6*  HCT 24.7* 20.2* 20.3*  MCV 81.3 78.0*  --   PLT 545* 451*  --    Basic Metabolic Panel: Recent Labs  Lab 07/26/19 1306 07/27/19 0710  NA 126* 135  K 6.6* 3.5  CL 102 107  CO2 10* 13*  GLUCOSE 134* 357*  BUN 100* PENDING  CREATININE 4.65* 2.67*  CALCIUM 8.7* 7.8*   GFR: Estimated Creatinine Clearance: 19.5 mL/min (A) (by C-G formula based on SCr of 2.67 mg/dL (H)). Liver Function Tests: Recent Labs  Lab 07/26/19 1306 07/27/19 0710  AST 10* 13*  ALT 9 7  ALKPHOS 90 63  BILITOT 1.2 0.7  PROT 8.1 6.3*  ALBUMIN 3.3* 2.6*   Recent Labs  Lab 07/26/19 1306  LIPASE 152*   No results for input(s): AMMONIA in the last 168 hours. Coagulation Profile: Recent Labs  Lab 07/27/19 0710  INR 1.8*   Cardiac Enzymes: No results for input(s): CKTOTAL, CKMB, CKMBINDEX, TROPONINI in the last 168 hours. BNP (last 3 results) No results for input(s): PROBNP in the last 8760 hours. HbA1C: No results for input(s): HGBA1C in the last 72 hours. CBG: Recent Labs  Lab 07/26/19 1357  GLUCAP 112*   Lipid Profile: No results for input(s): CHOL, HDL, LDLCALC, TRIG, CHOLHDL, LDLDIRECT in the last 72 hours. Thyroid Function Tests: No  results for input(s): TSH, T4TOTAL, FREET4, T3FREE, THYROIDAB in the last 72 hours. Anemia Panel: No results for input(s): VITAMINB12, FOLATE, FERRITIN, TIBC, IRON, RETICCTPCT in the last 72 hours. Urine analysis:    Component Value Date/Time   COLORURINE YELLOW 07/26/2019 1551   APPEARANCEUR TURBID (A) 07/26/2019 1551   LABSPEC 1.015 07/26/2019 1551   PHURINE 6.0 07/26/2019 1551   GLUCOSEU NEGATIVE 07/26/2019 1551   HGBUR LARGE (A) 07/26/2019 1551   BILIRUBINUR NEGATIVE 07/26/2019 1551   KETONESUR 5 (A) 07/26/2019 1551   PROTEINUR 100 (A) 07/26/2019 1551   UROBILINOGEN 0.2 03/01/2015  1913   NITRITE NEGATIVE 07/26/2019 1551   LEUKOCYTESUR MODERATE (A) 07/26/2019 1551   Sepsis Labs: @LABRCNTIP (procalcitonin:4,lacticidven:4)  ) Recent Results (from the past 240 hour(s))  Culture, blood (routine x 2)     Status: None (Preliminary result)   Collection Time: 07/26/19  1:00 PM   Specimen: BLOOD RIGHT FOREARM  Result Value Ref Range Status   Specimen Description BLOOD RIGHT FOREARM  Final   Special Requests   Final    BAA Blood Culture adequate volume Performed at [facility], [place] [address], [place], [place] [address]    Culture NO GROWTH < 24 HOURS  Final   Report Status PENDING  Incomplete  Respiratory Panel by RT PCR (Flu A&B, Covid) - Nasopharyngeal Swab     Status: None   Collection Time: 07/26/19  7:38 PM   Specimen: Nasopharyngeal Swab  Result Value Ref Range Status   SARS Coronavirus 2 by RT PCR NEGATIVE NEGATIVE Final    Comment: (NOTE) SARS-CoV-2 target nucleic acids are NOT DETECTED. The SARS-CoV-2 RNA is generally detectable in upper respiratoy specimens during the acute phase of infection. The lowest concentration of SARS-CoV-2 viral copies this assay can detect is 131 copies/mL. A negative result does not preclude SARS-Cov-2 infection and should not be used as the sole basis for treatment or other patient management decisions. A negative result may occur with  improper specimen collection/handling, submission of specimen other than nasopharyngeal swab, presence of viral mutation(s) within the areas targeted by this assay, and inadequate number of viral copies (<131 copies/mL). A negative result must be combined with clinical observations, patient history, and epidemiological information. The expected result is Negative. Fact Sheet for Patients:  PinkCheek.be Fact Sheet for Healthcare Providers:  GravelBags.it This test is not yet ap proved or cleared by the [place]  FDA and  has been authorized for detection and/or diagnosis of SARS-CoV-2 by FDA under an Emergency Use Authorization (EUA). This EUA will remain  in effect (meaning this test can be used) for the duration of the COVID-19 declaration under Section 564(b)(1) of the Act, 21 U.S.C. section 360bbb-3(b)(1), unless the authorization is terminated or revoked sooner.    Influenza A by PCR NEGATIVE NEGATIVE Final   Influenza B by PCR NEGATIVE NEGATIVE Final    Comment: (NOTE) The Xpert Xpress SARS-CoV-2/FLU/RSV assay is intended as an aid in  the diagnosis of influenza from Nasopharyngeal swab specimens and  should not be used as a sole basis for treatment. Nasal washings and  aspirates are unacceptable for Xpert Xpress SARS-CoV-2/FLU/RSV  testing. Fact Sheet for Patients: PinkCheek.be Fact Sheet for Healthcare Providers: GravelBags.it This test is not yet approved or cleared by the [place] FDA and  has been authorized for detection and/or diagnosis of SARS-CoV-2 by  FDA under an Emergency Use Authorization (EUA). This EUA will remain  in effect (meaning this test can be used) for  the duration of the  Covid-19 declaration under Section 564(b)(1) of the Act, 21  U.S.C. section 360bbb-3(b)(1), unless the authorization is  terminated or revoked. Performed at [facility], [place] [address], [place], [place] [address]       Studies: CT ABDOMEN PELVIS WO CONTRAST  Result Date: 07/26/2019 CLINICAL DATA:  64 year old with multiple sclerosis which leaves her bed ridden, presenting with difficulty having bowel movements and difficulty urinating, abdominal distension, and abdominal tenderness. She has abdominal guarding on physical examination. Laboratory data indicate leukocytosis and hyperkalemia. EXAM: CT ABDOMEN AND PELVIS WITHOUT CONTRAST TECHNIQUE: Multidetector CT imaging of the abdomen and pelvis was performed  following the standard protocol without IV contrast. COMPARISON:  08/14/2017 and earlier. FINDINGS: Lower chest: Minimal scarring in the LEFT LOWER LOBE posteriorly. Visualized lung bases otherwise clear. Asymmetric mild subpleural fat at the LEFT base which mimics a pleural effusion. Heart size normal. Extensive three-vessel coronary atherosclerosis. No pericardial effusion. Hepatobiliary: Normal unenhanced appearance of the liver. Contracted gallbladder without evidence of calcified gallstones. No biliary ductal dilation. Pancreas: Edema/inflammation adjacent to the proximal body of the pancreas and adjacent to the uncinate. Allowing for the unenhanced technique, no evidence of pancreatic mass. Spleen: Normal unenhanced appearance. Adrenals/Urinary Tract: Normal appearing adrenal glands. Multiple BILATERAL renal calculi, including partial staghorn calculi, as noted previously. No obstructing ureteral calculus on either side. Chronic RIGHT ureteral dilation. Edema involving the UPPER pole of the LEFT kidney, as it has a Hounsfield measurement of approximately 6 where as the remaining normal parenchyma has a Hounsfield measurement of approximately 30; this does not represent a simple cyst, however, as there was no cyst in this location on the prior CT. Urinary bladder decompressed and unremarkable. Stomach/Bowel: Stomach normal in appearance for the degree of distention. Normal-appearing small bowel. Mobile cecum positioned in the RIGHT mid abdomen. Large stool burden in the rectum, with circumferential rectal wall thickening/muscular hypertrophy. Normal colonic stool burden elsewhere. No focal abnormality involving the colon. Normal appendix in the RIGHT upper pelvis. Vascular/Lymphatic: Severe aortoiliofemoral atherosclerosis without evidence of aneurysm. Numerous normal sized retroperitoneal lymph nodes without evidence of a nodal mass. Reproductive: Enlarged uterus containing fibroids as noted previously. No  adnexal masses. Other: None. Musculoskeletal: Generalized muscular atrophy as noted previously, presumably due to the patient's MS. Osseous demineralization. Sclerosis involving the femoral heads bilaterally without evidence of subchondral collapse. No acute findings. IMPRESSION: 1. Edema/inflammation adjacent to the proximal body of the pancreas and adjacent to the uncinate process of the pancreas, query acute pancreatitis. 2. Large stool burden in the rectum with circumferential rectal wall thickening/muscular hypertrophy, possibly indicating mild stercoral colitis. 3. Multiple BILATERAL renal calculi, including partial staghorn calculi. No obstructing ureteral calculus on either side. 4. Edema involving the UPPER pole of the LEFT kidney suspicious for pyelonephritis. Please correlate with urinalysis. 5. Enlarged uterus containing fibroids as noted previously. 6. 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 Aortic Atherosclerosis (ICD10-I70.0). Electronically Signed   By: Evangeline Dakin M.D.   On: 07/26/2019 15:51   DG CHEST PORT 1 VIEW  Result Date: 07/27/2019 CLINICAL DATA:  Shortness of breath. EXAM: PORTABLE CHEST 1 VIEW COMPARISON:  March 28, 2019. FINDINGS: The heart size and mediastinal contours are within normal limits. Both lungs are clear. No pneumothorax or pleural effusion is noted. The visualized skeletal structures are unremarkable. IMPRESSION: No active disease. Electronically Signed   By: Marijo Conception M.D.   On: 07/27/2019 08:38  [place] EKG SITE RITE  Result Date: 07/27/2019 If [facility] image not attached, placement could not be confirmed due to current cardiac rhythm.   Scheduled Meds: . sodium chloride   Intravenous Once  . apixaban  5 mg Oral BID  . famotidine  20 mg Oral BID  . insulin aspart  0-9 Units Subcutaneous TID WC  . vancomycin variable dose per unstable renal function  (pharmacist dosing)   Does not apply See admin instructions    Continuous Infusions: . sodium chloride 150 mL/hr at 07/27/19 0053  . piperacillin-tazobactam (ZOSYN)  IV    . sodium bicarbonate 150 mEq in dextrose 5% 1000 mL 150 mEq (07/27/19 0705)     LOS: 1 day     Kayleen Memos, MD [facility] Pager [phone]  If 7PM-7AM, please contact night-coverage [email or web address] Password [facility] 07/27/2019, 9:33 AM

## 2019-07-27 NOTE — Progress Notes (Signed)
Pharmacy Antibiotic Note  Jocelyn Sanchez is a 64 y.o. female presented to the ED on 07/26/2019 with c/o abdominal pain, constipation and decreased urine output. In the ED, she was found to have AKI and was started on abx for suspected sepsis.   Today, 07/27/2019: - day #1 of vancomycin and zosyn - afeb, WBC wnl - scr improves to 2.67 (on sodium bicarb infusion)  Plan: - patient received a total of vancomycin 1500 mg dose on 2/6. Will check random vancomycin level with AM labs on 2/8 and give additional dose if/when appropriate - Will anticipate renal function to continue to improve with bicarb drip --> adjust zosyn to 3.375 gm IV q8h (infuse over 4 hrs) - monitor renal function closely  _____________________________  Weight: 146 lb 6.2 oz (66.4 kg)  Temp (24hrs), Avg:97.7 F (36.5 C), Min:97.4 F (36.3 C), Max:98.1 F (36.7 C)  Recent Labs  Lab 07/26/19 1306 07/27/19 0710  WBC 12.0* 9.7  CREATININE 4.65* 2.67*  LATICACIDVEN 0.8 3.6*    Estimated Creatinine Clearance: 19.5 mL/min (A) (by C-G formula based on SCr of 2.67 mg/dL (H)).    Allergies  Allergen Reactions  . Sulfa Antibiotics Shortness Of Breath and Swelling    Thank you for allowing pharmacy to be a part of this patient's care.  Dia Sitter P 07/27/2019 9:00 AM

## 2019-07-27 NOTE — Progress Notes (Signed)
   07/26/19 2153  Vitals  Temp 98.1 F (36.7 C)  BP (!) 70/50 (pt BP reported to run soft )  MAP (mmHg) (!) 58  BP Location Left Arm  BP Method Automatic  Patient Position (if appropriate) Lying  Pulse Rate 89  Resp 18  Oxygen Therapy  SpO2 100 %  O2 Device Room Air  MEWS Score  MEWS Temp 0  MEWS Systolic 2  MEWS Pulse 0  MEWS RR 0  MEWS LOC 0  MEWS Score 2  MEWS Score Color Yellow  MEWS Assessment  Is this an acute change? No   Reported received was PT BP runs soft systolic in low [id number]. [place] APP notified.

## 2019-07-28 DIAGNOSIS — K5909 Other constipation: Secondary | ICD-10-CM

## 2019-07-28 DIAGNOSIS — K8500 Idiopathic acute pancreatitis without necrosis or infection: Secondary | ICD-10-CM

## 2019-07-28 DIAGNOSIS — R933 Abnormal findings on diagnostic imaging of other parts of digestive tract: Secondary | ICD-10-CM

## 2019-07-28 DIAGNOSIS — R109 Unspecified abdominal pain: Secondary | ICD-10-CM

## 2019-07-28 LAB — LIPASE, BLOOD: Lipase: 650 U/L — ABNORMAL HIGH (ref 11–51)

## 2019-07-28 LAB — CBC WITH DIFFERENTIAL/PLATELET
Abs Immature Granulocytes: 0.06 10*3/uL (ref 0.00–0.07)
Basophils Absolute: 0.1 10*3/uL (ref 0.0–0.1)
Basophils Relative: 1 %
Eosinophils Absolute: 0.5 10*3/uL (ref 0.0–0.5)
Eosinophils Relative: 5 %
HCT: 35.6 % — ABNORMAL LOW (ref 36.0–46.0)
Hemoglobin: 12 g/dL (ref 12.0–15.0)
Immature Granulocytes: 1 %
Lymphocytes Relative: 12 %
Lymphs Abs: 1.3 10*3/uL (ref 0.7–4.0)
MCH: 26.8 pg (ref 26.0–34.0)
MCHC: 33.7 g/dL (ref 30.0–36.0)
MCV: 79.5 fL — ABNORMAL LOW (ref 80.0–100.0)
Monocytes Absolute: 0.7 10*3/uL (ref 0.1–1.0)
Monocytes Relative: 6 %
Neutro Abs: 8.8 10*3/uL — ABNORMAL HIGH (ref 1.7–7.7)
Neutrophils Relative %: 75 %
Platelets: 419 10*3/uL — ABNORMAL HIGH (ref 150–400)
RBC: 4.48 MIL/uL (ref 3.87–5.11)
RDW: 15.9 % — ABNORMAL HIGH (ref 11.5–15.5)
WBC: 11.4 10*3/uL — ABNORMAL HIGH (ref 4.0–10.5)
nRBC: 0 % (ref 0.0–0.2)

## 2019-07-28 LAB — TYPE AND SCREEN
ABO/RH(D): O POS
Antibody Screen: NEGATIVE
Unit division: 0
Unit division: 0

## 2019-07-28 LAB — BPAM RBC
Blood Product Expiration Date: 202103102359
Blood Product Expiration Date: 202103102359
ISSUE DATE / TIME: 202102071109
ISSUE DATE / TIME: 202102071348
Unit Type and Rh: 5100
Unit Type and Rh: 5100

## 2019-07-28 LAB — MRSA PCR SCREENING: MRSA by PCR: NEGATIVE

## 2019-07-28 LAB — POTASSIUM: Potassium: 2.9 mmol/L — ABNORMAL LOW (ref 3.5–5.1)

## 2019-07-28 LAB — GLUCOSE, CAPILLARY
Glucose-Capillary: 176 mg/dL — ABNORMAL HIGH (ref 70–99)
Glucose-Capillary: 246 mg/dL — ABNORMAL HIGH (ref 70–99)
Glucose-Capillary: 256 mg/dL — ABNORMAL HIGH (ref 70–99)
Glucose-Capillary: 180 mg/dL — ABNORMAL HIGH (ref 70–99)

## 2019-07-28 LAB — HEMOGLOBIN A1C
Hgb A1c MFr Bld: 6.7 % — ABNORMAL HIGH (ref 4.8–5.6)
Mean Plasma Glucose: 146 mg/dL

## 2019-07-28 LAB — BASIC METABOLIC PANEL
Anion gap: 13 (ref 5–15)
BUN: 60 mg/dL — ABNORMAL HIGH (ref 8–23)
CO2: 26 mmol/L (ref 22–32)
Calcium: 7 mg/dL — ABNORMAL LOW (ref 8.9–10.3)
Chloride: 103 mmol/L (ref 98–111)
Creatinine, Ser: 1.11 mg/dL — ABNORMAL HIGH (ref 0.44–1.00)
GFR calc Af Amer: >60 mL/min (ref >60)
GFR calc non Af Amer: 52 mL/min — ABNORMAL LOW (ref >60)
Glucose, Bld: 272 mg/dL — ABNORMAL HIGH (ref 70–99)
Potassium: 2.2 mmol/L — CL (ref 3.5–5.1)
Sodium: 142 mmol/L (ref 135–145)

## 2019-07-28 LAB — TRIGLYCERIDES: Triglycerides: 167 mg/dL — ABNORMAL HIGH (ref <150)

## 2019-07-28 LAB — VANCOMYCIN, RANDOM: Vancomycin Rm: 14

## 2019-07-28 MED ORDER — POTASSIUM CHLORIDE 10 MEQ/100ML IV SOLN
10.0000 meq | INTRAVENOUS | Status: AC
  Administered 2019-07-28 (×2): 10 meq via INTRAVENOUS
  Filled 2019-07-28 (×3): qty 100

## 2019-07-28 MED ORDER — METRONIDAZOLE IN NACL 5-0.79 MG/ML-% IV SOLN
500.0000 mg | Freq: Three times a day (TID) | INTRAVENOUS | Status: DC
  Administered 2019-07-28 – 2019-07-29 (×4): 500 mg via INTRAVENOUS
  Filled 2019-07-28 (×4): qty 100

## 2019-07-28 MED ORDER — VANCOMYCIN HCL IN DEXTROSE 1-5 GM/200ML-% IV SOLN
1000.0000 mg | INTRAVENOUS | Status: DC
  Administered 2019-07-28: 1000 mg via INTRAVENOUS
  Filled 2019-07-28: qty 200

## 2019-07-28 MED ORDER — VANCOMYCIN HCL IN DEXTROSE 1-5 GM/200ML-% IV SOLN: 1000.0000 mg | Freq: Once | INTRAVENOUS | Status: DC

## 2019-07-28 MED ORDER — LACTATED RINGERS IV SOLN: INTRAVENOUS | Status: DC

## 2019-07-28 MED ORDER — SACCHAROMYCES BOULARDII 250 MG PO CAPS
250.0000 mg | ORAL_CAPSULE | Freq: Two times a day (BID) | ORAL | Status: DC
  Administered 2019-07-28 – 2019-08-03 (×10): 250 mg via ORAL
  Filled 2019-07-28 (×12): qty 1

## 2019-07-28 MED ORDER — SODIUM CHLORIDE 0.9 % IV SOLN
2.0000 g | Freq: Two times a day (BID) | INTRAVENOUS | Status: DC
  Administered 2019-07-28 (×2): 2 g via INTRAVENOUS
  Filled 2019-07-28 (×3): qty 2

## 2019-07-28 MED ORDER — POTASSIUM CHLORIDE 10 MEQ/100ML IV SOLN
10.0000 meq | INTRAVENOUS | Status: AC
  Administered 2019-07-28 (×4): 10 meq via INTRAVENOUS
  Filled 2019-07-28 (×3): qty 100

## 2019-07-28 NOTE — Consult Note (Addendum)
Referring Provider:  Dr. Nevada Crane, [facility] Primary Care Physician:  Dixie Dials, MD Primary Gastroenterologist:  Althia Forts.  Has been seen by all local GI practices during hospitalizations.  Reason for Consultation:  Pancreatitis and stercoral colitis  HPI: Jocelyn Sanchez is a 64 y.o. female with medical history significant for anemia, type 2 diabetes mellitus, gait disorder, hypertension, GI bleed gastric and duodenal angiodysplasia, lymphedema, multiple sclerosis, history of pulmonary embolism on Eliquis.  Admission notes say that she came to the ED with complaints of abdominal pain and constipation, but was not vomiting.  Patient tells me that she came in for constipation and vomiting (could keep anything down), but was never having abdominal pain and is not having any abdominal pain now.  She is asking to eat.  She was just placed on clear liquids today.  Says that she takes Miralax and stool softeners at home but always has issues with constipation.  Lipase 152-->2747-->650. Hgb 7.7-->6.5-->2 units PRBC's on 2/7-->12.9-->12.0 Cdiff antigen positive, but toxin negative.  PCR pending.  GI pathogen panel pending.  Flexiseal is in place. Iron studies c/w AOCD. Had AKI which is improving.  K+ was initially high, but now very low this AM.  CT scan of the abdomen and pelvis without contrast showed the following:  IMPRESSION: 1. Edema/inflammation adjacent to the proximal body of the pancreas and adjacent to the uncinate process of the pancreas, query acute pancreatitis. 2. Large stool burden in the rectum with circumferential rectal wall thickening/muscular hypertrophy, possibly indicating mild stercoral colitis. 3. Multiple BILATERAL renal calculi, including partial staghorn calculi. No obstructing ureteral calculus on either side. 4. Edema involving the UPPER pole of the LEFT kidney suspicious for pyelonephritis. Please correlate with urinalysis. 5. Enlarged uterus containing fibroids as noted  previously. 6. 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  Aortic Atherosclerosis (ICD10-I70.0).  Past Medical History:  Diagnosis Date  . Anemia   . DM II (diabetes mellitus, type II), controlled ([place])   . Gait disorder   . HTN (hypertension)   . Lymphedema   . MS (multiple sclerosis) ([place])   . Pulmonary embolism [facility])     Past Surgical History:  Procedure Laterality Date  . ESOPHAGOGASTRODUODENOSCOPY N/A 07/12/2016   Procedure: ESOPHAGOGASTRODUODENOSCOPY (EGD);  Surgeon: Irene Shipper, MD;  Location: [facility];  Service: Endoscopy;  Laterality: N/A;  . ESOPHAGOGASTRODUODENOSCOPY N/A 02/11/2019   Procedure: ESOPHAGOGASTRODUODENOSCOPY (EGD);  Surgeon: Carol Ada, MD;  Location: [facility];  Service: Endoscopy;  Laterality: N/A;  . FLEXIBLE SIGMOIDOSCOPY Left 03/09/2015   Procedure: FLEXIBLE SIGMOIDOSCOPY;  Surgeon: Carol Ada, MD;  Location: [facility] ENDOSCOPY;  Service: Endoscopy;  Laterality: Left;  . HOT HEMOSTASIS N/A 02/11/2019   Procedure: HOT HEMOSTASIS (ARGON PLASMA COAGULATION/BICAP);  Surgeon: Carol Ada, MD;  Location: [facility];  Service: Endoscopy;  Laterality: N/A;    Prior to Admission medications   Medication Sig Start Date End Date Taking? Authorizing Provider  acetaminophen (TYLENOL) 500 MG tablet Take 1,000 mg by mouth every 6 (six) hours as needed for moderate pain.   Yes [provider]  carvedilol (COREG) 6.25 MG tablet Take 6.25 mg by mouth 2 (two) times daily. 12/21/18  Yes [provider]  cefdinir (OMNICEF) 300 MG capsule Take 300 mg by mouth every 12 (twelve) hours. 07/16/19  Yes [provider]  ELIQUIS 5 MG TABS tablet Take 5 mg by mouth 2 (two) times daily. 03/11/19  Yes [provider]  ENTRESTO 24-26  MG Take 1 tablet by mouth 2 (two) times daily. 03/13/19  Yes [provider]  famotidine (PEPCID) 20 MG tablet  Take 1 tablet (20 mg total) by mouth 2 (two) times daily. 08/16/17 07/26/19 Yes Arrien, Jimmy Picket, MD  FARXIGA 5 MG TABS tablet Take 5 mg by mouth daily. 06/13/19  Yes [provider]  metFORMIN (GLUCOPHAGE) 500 MG tablet Take 500 mg by mouth 2 (two) times daily. 07/25/19  Yes [provider]  nitrofurantoin, macrocrystal-monohydrate, (MACROBID) 100 MG capsule Take 100 mg by mouth every 12 (twelve) hours. 07/16/19  Yes [provider]  ondansetron (ZOFRAN) 4 MG tablet Take 4 mg by mouth every 6 (six) hours as needed for nausea. 07/16/19  Yes [provider]  traZODone (DESYREL) 50 MG tablet Take 50 mg by mouth at bedtime. 06/27/19  Yes [provider]    Current Facility-Administered Medications  Medication Dose Route Frequency Provider Last Rate Last Admin  . acetaminophen (TYLENOL) tablet 1,000 mg  1,000 mg Oral Q6H PRN Cristal Deer, MD   1,000 mg at 07/26/19 2328  . apixaban (ELIQUIS) tablet 5 mg  5 mg Oral BID Cristal Deer, MD   5 mg at 07/28/19 0940  . ceFEPIme (MAXIPIME) 2 g in sodium chloride 0.9 % 100 mL IVPB  2 g Intravenous Q12H Hall, Carole N, DO      . famotidine (PEPCID) tablet 10 mg  10 mg Oral Daily [place], Archie Patten N, DO   10 mg at 07/28/19 0940  . insulin aspart (novoLOG) injection 0-9 Units  0-9 Units Subcutaneous TID WC Cristal Deer, MD   2 Units at 07/28/19 0844  . lactated ringers infusion   Intravenous Continuous Hall, Carole N, DO      . lip balm (CARMEX) ointment   Topical PRN Irene Pap N, DO      . metroNIDAZOLE (FLAGYL) IVPB 500 mg  500 mg Intravenous Q8H Hall, Carole N, DO      . ondansetron (ZOFRAN) tablet 4 mg  4 mg Oral Q6H PRN Cristal Deer, MD      . potassium chloride 10 mEq in 100 mL IVPB  10 mEq Intravenous Q1 Hr x 4 Hall, Carole N, DO 100 mL/hr at 07/28/19 1140 10 mEq at 07/28/19 1140  . saccharomyces boulardii (FLORASTOR) capsule 250 mg  250 mg Oral BID Irene Pap N, DO      . vancomycin (VANCOCIN)  IVPB 1000 mg/200 mL premix  1,000 mg Intravenous Q24H Lenis Noon, RPH 200 mL/hr at 07/28/19 0955 1,000 mg at 07/28/19 0955    Allergies as of 07/26/2019 - Review Complete 07/26/2019  Allergen Reaction Noted  . Sulfa antibiotics Shortness Of Breath and Swelling 05/10/2011    Family History  Problem Relation Age of Onset  . Multiple sclerosis Mother     Social History   Socioeconomic History  . Marital status: Single    Spouse name: Not on file  . Number of children: 3  . Years of education: Not on file  . Highest education level: Not on file  Occupational History    Employer: UNEMPLOYED  Tobacco Use  . Smoking status: Former Smoker    Types: Cigarettes  . Smokeless tobacco: Never Used  Substance and Sexual Activity  . Alcohol use: No  . Drug use: No  . Sexual activity: Never  Other Topics Concern  . Not on file  Social History Narrative   Patient is single.   Patient has three children.   Patient  is disabled.   Patient is right-handed.   Patient does not drink any caffeine.   Social Determinants of Health   Financial Resource Strain:   . Difficulty of Paying Living Expenses: Not on file  Food Insecurity:   . Worried About Charity fundraiser in the Last Year: Not on file  . Ran Out of Food in the Last Year: Not on file  Transportation Needs:   . Lack of Transportation (Medical): Not on file  . Lack of Transportation (Non-Medical): Not on file  Physical Activity:   . Days of Exercise per Week: Not on file  . Minutes of Exercise per Session: Not on file  Stress:   . Feeling of Stress : Not on file  Social Connections:   . Frequency of Communication with Friends and Family: Not on file  . Frequency of Social Gatherings with Friends and Family: Not on file  . Attends Religious Services: Not on file  . Active Member of Clubs or Organizations: Not on file  . Attends Archivist Meetings: Not on file  . Marital Status: Not on file  Intimate Partner  Violence:   . Fear of Current or Ex-Partner: Not on file  . Emotionally Abused: Not on file  . Physically Abused: Not on file  . Sexually Abused: Not on file    Review of Systems: ROS is O/W negative except as mentioned in HPI.  Physical Exam: Vital signs in last 24 hours: Temp:  [98 F (36.7 C)-98.6 F (37 C)] 98.6 F (37 C) (02/08 0436) Pulse Rate:  [89-102] 89 (02/08 0436) Resp:  [17-18] 18 (02/08 0436) BP: (94-104)/(60-70) 94/60 (02/08 0436) SpO2:  [99 %-100 %] 99 % (02/08 0436) Last BM Date: 07/28/19 General:  Alert, chronically ill-appearing, pleasant and cooperative in NAD Head:  Normocephalic and atraumatic. Eyes:  Sclera clear, no icterus.  Conjunctiva pink. Ears:  Normal auditory acuity. Mouth:  No deformity or lesions.   Lungs:  Clear throughout to auscultation.  No wheezes, crackles, or rhonchi.  Heart:  Regular rate and rhythm; no murmurs, clicks, rubs, or gallops. Abdomen:  Soft, non-distended.  BS present.  Non-tender. Rectal:  No external abnormalities noted.  DRE did not reveal any masses or hard stool/fecal impaction.  Soft/mushy light brown stool noted.  Flexiseal had already fallen out when I had the nurses help me perform a rectal exam. Msk:  Symmetrical without gross deformities. Pulses:  Normal pulses noted. Extremities:  Upper extremity contractures noted. Neurologic:  Alert and oriented x 4;  grossly normal neurologically. Skin:  Intact without significant lesions or rashes. Psych:  Alert and cooperative. Normal mood and affect.  Intake/Output from previous day: 02/07 0701 - 02/08 0700 In: 3618.3 [P.O.:120; I.V.:2561.6; Blood:772.5; IV Piggyback:164.1] Out: 2000 [Urine:2000] Intake/Output this shift: Total I/O In: 120 [P.O.:120] Out: -   Lab Results: Recent Labs    07/26/19 1306 07/26/19 1306 07/27/19 0710 07/27/19 0710 07/27/19 0753 07/27/19 1904 07/28/19 0526  WBC 12.0*  --  9.7  --   --   --  11.4*  HGB 7.7*   < > 6.5*   < > 6.6*  12.9 12.0  HCT 24.7*   < > 20.2*   < > 20.3* 37.5 35.6*  PLT 545*  --  451*  --   --   --  419*   < > = values in this interval not displayed.   BMET Recent Labs    07/26/19 1306 07/27/19 0710 07/28/19 0526  NA  126* 135 142  K 6.6* 3.5 2.2*  CL 102 107 103  CO2 10* 13* 26  GLUCOSE 134* 357* 272*  BUN 100* 104* 60*  CREATININE 4.65* 2.67* 1.11*  CALCIUM 8.7* 7.8* 7.0*   LFT Recent Labs    07/27/19 0710  PROT 6.3*  ALBUMIN 2.6*  AST 13*  ALT 7  ALKPHOS 63  BILITOT 0.7   PT/INR Recent Labs    07/27/19 0710  LABPROT 21.0*  INR 1.8*   Studies/Results: CT ABDOMEN PELVIS WO CONTRAST  Result Date: 07/26/2019 CLINICAL DATA:  64 year old with multiple sclerosis which leaves her bed ridden, presenting with difficulty having bowel movements and difficulty urinating, abdominal distension, and abdominal tenderness. She has abdominal guarding on physical examination. Laboratory data indicate leukocytosis and hyperkalemia. EXAM: CT ABDOMEN AND PELVIS WITHOUT CONTRAST TECHNIQUE: Multidetector CT imaging of the abdomen and pelvis was performed following the standard protocol without IV contrast. COMPARISON:  08/14/2017 and earlier. FINDINGS: Lower chest: Minimal scarring in the LEFT LOWER LOBE posteriorly. Visualized lung bases otherwise clear. Asymmetric mild subpleural fat at the LEFT base which mimics a pleural effusion. Heart size normal. Extensive three-vessel coronary atherosclerosis. No pericardial effusion. Hepatobiliary: Normal unenhanced appearance of the liver. Contracted gallbladder without evidence of calcified gallstones. No biliary ductal dilation. Pancreas: Edema/inflammation adjacent to the proximal body of the pancreas and adjacent to the uncinate. Allowing for the unenhanced technique, no evidence of pancreatic mass. Spleen: Normal unenhanced appearance. Adrenals/Urinary Tract: Normal appearing adrenal glands. Multiple BILATERAL renal calculi, including partial staghorn  calculi, as noted previously. No obstructing ureteral calculus on either side. Chronic RIGHT ureteral dilation. Edema involving the UPPER pole of the LEFT kidney, as it has a Hounsfield measurement of approximately 6 where as the remaining normal parenchyma has a Hounsfield measurement of approximately 30; this does not represent a simple cyst, however, as there was no cyst in this location on the prior CT. Urinary bladder decompressed and unremarkable. Stomach/Bowel: Stomach normal in appearance for the degree of distention. Normal-appearing small bowel. Mobile cecum positioned in the RIGHT mid abdomen. Large stool burden in the rectum, with circumferential rectal wall thickening/muscular hypertrophy. Normal colonic stool burden elsewhere. No focal abnormality involving the colon. Normal appendix in the RIGHT upper pelvis. Vascular/Lymphatic: Severe aortoiliofemoral atherosclerosis without evidence of aneurysm. Numerous normal sized retroperitoneal lymph nodes without evidence of a nodal mass. Reproductive: Enlarged uterus containing fibroids as noted previously. No adnexal masses. Other: None. Musculoskeletal: Generalized muscular atrophy as noted previously, presumably due to the patient's MS. Osseous demineralization. Sclerosis involving the femoral heads bilaterally without evidence of subchondral collapse. No acute findings. IMPRESSION: 1. Edema/inflammation adjacent to the proximal body of the pancreas and adjacent to the uncinate process of the pancreas, query acute pancreatitis. 2. Large stool burden in the rectum with circumferential rectal wall thickening/muscular hypertrophy, possibly indicating mild stercoral colitis. 3. Multiple BILATERAL renal calculi, including partial staghorn calculi. No obstructing ureteral calculus on either side. 4. Edema involving the UPPER pole of the LEFT kidney suspicious for pyelonephritis. Please correlate with urinalysis. 5. Enlarged uterus containing fibroids as noted  previously. 6. 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 Aortic Atherosclerosis (ICD10-I70.0). Electronically Signed   By: Evangeline Dakin M.D.   On: 07/26/2019 15:51   DG CHEST PORT 1 VIEW  Result Date: 07/27/2019 CLINICAL DATA:  Shortness of breath. EXAM: PORTABLE CHEST 1 VIEW COMPARISON:  March 28, 2019. FINDINGS: The heart size and mediastinal contours are within  normal limits. Both lungs are clear. No pneumothorax or pleural effusion is noted. The visualized skeletal structures are unremarkable. IMPRESSION: No active disease. Electronically Signed   By: Marijo Conception M.D.   On: 07/27/2019 08:38   [place] EKG SITE RITE  Result Date: 07/27/2019 If Site Rite image not attached, placement could not be confirmed due to current cardiac rhythm.   IMPRESSION:  *Acute pancreatitis:  Lipase 152-->2747-->650.  Edema/inflammation adjacent to the proximal body of the pancreas and adjacent to the uncinate process of the pancreas by CT scan.  ? Source of pancreatitis.  LFT's are normal.  No ETOH use.  No abdominal pain. *Large stool burden in the rectum with circumferential rectal wall thickening/muscular hypertrophy, possibly indicating mild stercoral colitis.  Diarrhea was likely overflow diarrhea.  Cdiff Ag positive but toxin negative and PCR is pending as well as GI pathogen panel.  Of note, she had a normal flex sig in 2016 that was done for evaluation of "severe proctitis" seen by CT scan.  Has chronic constipation due to debilitated state.  *AKI:  Improving. *Hypokalemia:  K+ 2.2 today.  Replacement by primary service. *Anemia:  Hgb 7.7-->6.5-->2 units PRBC's on 2/7-->12.9-->12.0.  Iron studies c/w AOCD.  FOBT +, but no overt GI bleeding.  Had a very dramatic response to PRBC's. *MS:  Bedridden. *History of PE on Eliquis  PLAN: *Will check triglyceride levels. *Ok to advance diet if tolerates clear  liquids. *Will give tap water enema to help clean out some stool from the rectum although no hard stool or impaction was felt on my exam today. *Needs a regular bowel regimen at the time of discharge.  Likely will need Miralax BID (was taking daily at home with stool softener according to her report) and possibly some senna.   Laban Emperor. Zehr  07/28/2019, 12:36 PM   I have reviewed the entire case in detail with the above APP and discussed the plan in detail.  Therefore, I agree with the diagnoses recorded above. In addition,  I have personally interviewed and examined the patient and have personally reviewed any abdominal/pelvic CT scan images.  My additional thoughts are as follows:  We do not know why she developed pancreatitis, and triglycerides of been ordered for further work-up.  Her medicines do not seem likely culprits.  Also, she seems to have a clinically mild case of it, lipase is coming down, she has no tenderness on exam today and she is asking for food.  It seems that she had acute on chronic constipation contributing to symptoms as well.  Rectal wall thickening on CT indicates chronicity to the obstipation. No plans for lower GI evaluation at this point.  She has had some relief after getting tap water enemas, she will need increase of MiraLAX to twice daily going forward.  Clinically doubt she has active C. difficile infection.  If she is still doing well the clear liquids by tomorrow morning, advance her to a low-fat diet and call us as the need arises.   Nelida Meuse III [phone]

## 2019-07-28 NOTE — Progress Notes (Signed)
CRITICAL VALUE ALERT  Critical Value : K 2.2 Date & Time Notied:  07/28/19 @ [id number] Provider Notified:yes [place] APP  Orders Received/Actions taken:none

## 2019-07-28 NOTE — Progress Notes (Addendum)
VAST consulted to place 2nd IV access d/t multiple medications and fluids. Pt's right hand and forearm swollen from previous IV infiltration. Pt has MS and left arm is contracted greater than right. Current PIV in left hand. Unable to move left arm out. Attempted one PIV stick in left posterior forearm unsuccessfully. Educated pt's nurse to prop right hand and arm on pillow above heart level and apply ice on and off in 20 min intervals to help alleviate swelling and pain. She verbalized understanding.

## 2019-07-28 NOTE — Consult Note (Signed)
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   Wound type: pressure injury and IAD Pressure Injury POA: Yes Measurement: 1 cm x 1.5 cm x 0.8 cm  Wound [id number] red Drainage (amount, consistency, odor) minimal serosanguinous  No odor.  Periwound:Incontinence associated dermatitis to bilateral skin folds. 0.3 cm nonintact lesion Left gluteal fold:  1 cm x 0.4 cm x 0.1 cm  Dressing procedure/placement/frequency: Cleanse buttocks and sacral wound with soap and water and pat dry.  Fill wound depth with aquacel and cover with dry foam dressing.  Apply gerhardts to bilateral gluteal folds twice daily and PRN soilage.  Will not follow at this time.  Please re-consult if needed.  Domenic Moras MSN, RN, FNP-BC [facility] Wound, Ostomy, Continence Nurse Pager [phone]

## 2019-07-28 NOTE — Progress Notes (Signed)
PROGRESS NOTE  LENNETTA STORLIE [id number] DOB: 12-17-1955 DOA: 07/26/2019 PCP: Dixie Dials, MD  HPI/Recap of past 24 hours: Jocelyn Sanchez is a 64 y.o. female with medical history significant for chronic anemia, type 2 diabetes mellitus, gait disorder, hypertension, GI bleed, gastric and duodenal angiodysplasia, lymphedema, multiple sclerosis, history of pulmonary embolism on Eliquis, who presented to the emergency department with complaints of constipation associated with abdominal pain.  She had not had bowel movement for 48 hours and was feeling uncomfortable.  Also has complaint of decreased urinary output over the last 24 hours.   ED Course: K+ 6.6 sodium 126 white count 12.0 hemoglobin 7.7 and lactic acid was however normal, she was also found to be dehydrated.  Blood cultures were obtained and are pending she was diagnosed to have sepsis and code sepsis was activated received 2 boluses of IV fluid and broad-spectrum antibiotics started for possible early sepsis.  Elevated creatinine with acute kidney injury in addition to the hyperkalemia.  EDMD consulted with nephrology who advised to start her on bicarb infusion and hydration overnight.  Hospital course conjugated by diarrhea, + admission.  C. difficile antigen positive.  Toxin negative.  PCR pending.  Also drop in hemoglobin with positive FOBT.  07/28/19:  Seen and examined.  States she feels better today.  Lipase level trending down but still elevated.  She denies abdominal pain or nausea.  She asked for a diet.  Started on clear liquid diet.  Abn correction of H&H 12.0 from 6.5 post 2 U PRBCs transfusion.   Assessment/Plan: Principal Problem:   Septic shock (HCC) Active Problems:   Multiple sclerosis (HCC)   Diabetes mellitus with peripheral vascular disease (HCC)   Leukocytosis   Sepsis ([place])  Severe sepsis likely multifactorial secondary to UTI with suspected intra-abdominal infection Presented with leukocytosis,  tachycardia, diffuse abdominal pain, hypotension, now elevated lactic acid 3.6. Sepsis physiology is improving. CT abdomen and pelvis without contrast showed suspected acute pancreatitis.  Large stool burden in the rectum with circumferential rectal wall thickening muscular hypertrophy possibly indicating mild stercoral colitis, suspected left pyelonephritis. Positive UA, urine culture and MRSA screening ordered and pending. Started on IV Zosyn and IV vancomycin empirically, continue Blood culture negative to date, continue to follow cultures. Monitor fever curve and WBC Daily CBCs  Possible mild stercoral colitis on CT Hx of chronic constipation Diarrhea yesterday is improving Seen on CT abd pelvis without contrast GI [place] consulted  Hx of C-diff diarrhea Stool sample Cdiff antigen positive, PCR is pending Diarrhea is improving this am Start florastor Continue to monitor electrolytes and signs of dehydration  Hypokalemia, multifactorial secondary to bicarb infusion and diarrhea.  K+ 2.2 Repleted with IV potassium supplement Repeat potassium level.  Acute pancreatitis, etiology is unclear. Elevated lipase >170>> 2700>> 650 Patient requested diet.  Denies nausea or abdominal pain at this time.  Started clear liquid diet. Gentle IV fluid in the setting of HFREF No evidence of gallstone or biliary duct dilatation on CT Slight inflammation adjacent to the proximal body of the pancreas and adjacent to the uncinate process of the pancreas on CT abdomen and pelvis No evidence of pancreatic mass  Presumed UTI with suspected left pyelonephritis Positive UA for pyuria Urine culture ordered and pending Continue empiric antibiotic Blood cultures in process.  Continue to follow cultures.  Chronic constipation now with possible stercoral colitis  Stercoral colitis management per GI.  Resolving AKI likely prerenal in the setting of poor oral intake and dehydration  Hypovolemic on  exam Presented with creatinine of 4.65 Baseline creatinine 0.7 with GFR greater than 60 Creatinine is trending down to 1.1 with GFR greater than 60. Continue to avoid nephrotoxins and hypotension Continue to monitor urine output Continue daily BMPs.  Multiple bilateral renal calculi including partial staghorn calculi on CT abdomen and pelvis Monitor urine output and symptomatology  Resolved anion gap metabolic acidosis likely secondary to acute renal failure Presented with serum bicarb of 10 and anion gap of 14>> serum bicarb 26 and anion gap of 13. DC bicarb drip.  Acute blood loss, unclear etiology Positive FOBT with no overt signs of bleeding Iron studies indicative of iron deficiency.  Start ferrous sulfate 325 mg daily. Hemoglobin dropped to 6.6, this was confirmed with repeated H&H. Post 2 unit PRBC transfusion hemoglobin 12, lab error? overcorrection?.    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  Chronic systolic CHF with LVEF [id number] Last 2D echo done on 08/09/2017 showed LVEF 40-45% with diffuse hypokinesis Closely monitor volume status while on IV fluid to avoid complications such as pulmonary edema or bilateral pleural effusions Continue Strict I's and O's and daily weight  History of PE Stable Continue Eliquis  Type 2 diabetes A1c 6.7 on 07/27/19 Continue insulin sliding scale   DVT prophylaxis: Eliquis/SCD  Code Status: Full code personally confirmed with patient  Family Communication: None at bedside  Disposition Plan:  Patient is from home.  Anticipate discharge to home in the next 48 to 72 hours.  Barrier to discharge: Sepsis with ongoing IV antibiotics treatment, possible stercoral colitis.  Consults called: Nephrology Per EDMD, GI [place].    Objective: Vitals:   07/27/19 1415  07/27/19 1641 07/27/19 2054 07/28/19 0436  BP: 100/67 101/60 104/67 94/60  Pulse:  90 94 89  Resp:   17 18  Temp: 98 F (36.7 C) 98.6 F (37 C) 98.6 F (37 C) 98.6 F (37 C)  TempSrc: Oral Oral Oral   SpO2: 100% 100% 100% 99%  Weight:        Intake/Output Summary (Last 24 hours) at 07/28/2019 1133 Last data filed at 07/28/2019 1000 Gross per 24 hour  Intake 3618.25 ml  Output 2000 ml  Net 1618.25 ml   Filed Weights   07/26/19 2300  Weight: 66.4 kg    Exam:  . General: 64 y.o. year-old female well-developed well-nourished no acute distress.  Alert and interactive. . Cardiovascular: Regular rate and rhythm no rubs or gallops. Marland Kitchen Respiratory: Clear to auscultation with no wheezes or rales.  Good inspiratory effort. . Abdomen: Mild tenderness periumbilically.  Bowel sounds present.  Musculoskeletal: Trace lower extremity edema bilaterally. Marland Kitchen Psychiatry: Mood is appropriate for condition and setting.   Data Reviewed: CBC: Recent Labs  Lab 07/26/19 1306 07/27/19 0710 07/27/19 0753 07/27/19 1904 07/28/19 0526  WBC 12.0* 9.7  --   --  11.4*  NEUTROABS 9.7*  --   --   --  8.8*  HGB 7.7* 6.5* 6.6* 12.9 12.0  HCT 24.7* 20.2* 20.3* 37.5 35.6*  MCV 81.3 78.0*  --   --  79.5*  PLT 545* 451*  --   --  123XX123*   Basic Metabolic Panel: Recent Labs  Lab 07/26/19 1306 07/27/19 0710 07/28/19 0526  NA 126* 135 142  K 6.6* 3.5 2.2*  CL 102 107 103  CO2 10* 13* 26  GLUCOSE 134*  357* 272*  BUN 100* 104* 60*  CREATININE 4.65* 2.67* 1.11*  CALCIUM 8.7* 7.8* 7.0*   GFR: Estimated Creatinine Clearance: 46.9 mL/min (A) (by C-G formula based on SCr of 1.11 mg/dL (H)). Liver Function Tests: Recent Labs  Lab 07/26/19 1306 07/27/19 0710  AST 10* 13*  ALT 9 7  ALKPHOS 90 63  BILITOT 1.2 0.7  PROT 8.1 6.3*  ALBUMIN 3.3* 2.6*   Recent Labs  Lab 07/26/19 1306 07/27/19 0753 07/28/19 0526  LIPASE 152* 2,747* 650*   No results for input(s): AMMONIA in the last 168  hours. Coagulation Profile: Recent Labs  Lab 07/27/19 0710  INR 1.8*   Cardiac Enzymes: No results for input(s): CKTOTAL, CKMB, CKMBINDEX, TROPONINI in the last 168 hours. BNP (last 3 results) No results for input(s): PROBNP in the last 8760 hours. HbA1C: Recent Labs    07/27/19 0705  HGBA1C 6.7*   CBG: Recent Labs  Lab 07/26/19 1357 07/27/19 1255 07/27/19 1711 07/27/19 2050 07/28/19 0802  GLUCAP 112* 270* 143* 153* 176*   Lipid Profile: No results for input(s): CHOL, HDL, LDLCALC, TRIG, CHOLHDL, LDLDIRECT in the last 72 hours. Thyroid Function Tests: No results for input(s): TSH, T4TOTAL, FREET4, T3FREE, THYROIDAB in the last 72 hours. Anemia Panel: Recent Labs    07/27/19 0710  FERRITIN 121  TIBC 185*  IRON 19*   Urine analysis:    Component Value Date/Time   COLORURINE YELLOW 07/26/2019 1551   APPEARANCEUR TURBID (A) 07/26/2019 1551   LABSPEC 1.015 07/26/2019 1551   PHURINE 6.0 07/26/2019 1551   GLUCOSEU NEGATIVE 07/26/2019 1551   HGBUR LARGE (A) 07/26/2019 1551   BILIRUBINUR NEGATIVE 07/26/2019 1551   KETONESUR 5 (A) 07/26/2019 1551   PROTEINUR 100 (A) 07/26/2019 1551   UROBILINOGEN 0.2 03/01/2015 1913   NITRITE NEGATIVE 07/26/2019 1551   LEUKOCYTESUR MODERATE (A) 07/26/2019 1551   Sepsis Labs: @LABRCNTIP (procalcitonin:4,lacticidven:4)  ) Recent Results (from the past 240 hour(s))  Culture, blood (routine x 2)     Status: None (Preliminary result)   Collection Time: 07/26/19  1:00 PM   Specimen: BLOOD RIGHT FOREARM  Result Value Ref Range Status   Specimen Description BLOOD RIGHT FOREARM  Final   Special Requests   Final    BAA Blood Culture adequate volume Performed at [facility], [place] [address] [place], [place] [address]    Culture NO GROWTH < 24 HOURS  Final   Report Status PENDING  Incomplete  Respiratory Panel by RT PCR (Flu A&B, Covid) - Nasopharyngeal Swab     Status: None   Collection Time: 07/26/19  7:38 PM    Specimen: Nasopharyngeal Swab  Result Value Ref Range Status   SARS Coronavirus 2 by RT PCR NEGATIVE NEGATIVE Final    Comment: (NOTE) SARS-CoV-2 target nucleic acids are NOT DETECTED. The SARS-CoV-2 RNA is generally detectable in upper respiratoy specimens during the acute phase of infection. The lowest concentration of SARS-CoV-2 viral copies this assay can detect is 131 copies/mL. A negative result does not preclude SARS-Cov-2 infection and should not be used as the sole basis for treatment or other patient management decisions. A negative result may occur with  improper specimen collection/handling, submission of specimen other than nasopharyngeal swab, presence of viral mutation(s) within the areas targeted by this assay, and inadequate number of viral copies (<131 copies/mL). A negative result must be combined with clinical observations, patient history, and epidemiological information. The expected result is Negative. Fact Sheet for Patients:  PinkCheek.be Fact Sheet  for Healthcare Providers:  GravelBags.it This test is not yet ap proved or cleared by the [place] and  has been authorized for detection and/or diagnosis of SARS-CoV-2 by FDA under an Emergency Use Authorization (EUA). This EUA will remain  in effect (meaning this test can be used) for the duration of the COVID-19 declaration under Section 564(b)(1) of the Act, 21 U.S.C. section 360bbb-3(b)(1), unless the authorization is terminated or revoked sooner.    Influenza A by PCR NEGATIVE NEGATIVE Final   Influenza B by PCR NEGATIVE NEGATIVE Final    Comment: (NOTE) The Xpert Xpress SARS-CoV-2/FLU/RSV assay is intended as an aid in  the diagnosis of influenza from Nasopharyngeal swab specimens and  should not be used as a sole basis for treatment. Nasal washings and  aspirates are unacceptable for Xpert Xpress SARS-CoV-2/FLU/RSV  testing. Fact Sheet  for Patients: PinkCheek.be Fact Sheet for Healthcare Providers: GravelBags.it This test is not yet approved or cleared by the [place] FDA and  has been authorized for detection and/or diagnosis of SARS-CoV-2 by  FDA under an Emergency Use Authorization (EUA). This EUA will remain  in effect (meaning this test can be used) for the duration of the  Covid-19 declaration under Section 564(b)(1) of the Act, 21  U.S.C. section 360bbb-3(b)(1), unless the authorization is  terminated or revoked. Performed at [facility], [place] [address] [place], [place] [address]   C difficile quick scan w PCR reflex     Status: Abnormal   Collection Time: 07/27/19  7:52 AM   Specimen: STOOL  Result Value Ref Range Status   C Diff antigen POSITIVE (A) NEGATIVE Final   C Diff toxin NEGATIVE NEGATIVE Final   C Diff interpretation Results are indeterminate. See PCR results.  Final    Comment: Performed at [facility], [place] [address], [place], [place] [address]      Studies: No results found.  Scheduled Meds: . apixaban  5 mg Oral BID  . famotidine  10 mg Oral Daily  . insulin aspart  0-9 Units Subcutaneous TID WC    Continuous Infusions: . piperacillin-tazobactam (ZOSYN)  IV 3.375 g (07/28/19 0355)  . potassium chloride    . sodium bicarbonate 150 mEq in dextrose 5% 1000 mL Stopped (07/28/19 0644)  . vancomycin 1,000 mg (07/28/19 0955)     LOS: 2 days     Kayleen Memos, MD [facility] Pager [phone]  If 7PM-7AM, please contact night-coverage [email or web address] Password [facility] 07/28/2019, 11:33 AM

## 2019-07-28 NOTE — Progress Notes (Signed)
Phlebotomist at bedside. Pt has refused blood draw. Will have lab follow up.  Venous access remains poor. Will attempt site.

## 2019-07-28 NOTE — Progress Notes (Signed)
Pharmacy Antibiotic Note  Jocelyn Sanchez is a 64 y.o. female presented to the ED on 07/26/2019 with c/o abdominal pain, constipation and decreased urine output. In the ED, she was found to have AKI and was started on abx for suspected sepsis, suspected source likely multifactorial (UTI, IAI).   Today, 07/28/2019: - Day #2 full dose IV antibiotics - SCr 1.11, CrCl ~46 mL/min. Renal function significantly improved - Vancomycin random level 14 mcg/mL. Appropriate to re-dose as < 20 mcg/mL - afebrile - WBC 11.4, increased  Plan:  Vancomycin 1000 mg IV q24h  Piperacillin/tazobactam 3.375 g IV q8h  Monitor renal function  F/u culture data. Check vancomycin levels once at steady state if indicated - goal vancomycin AUC 400-550.  Weight: 146 lb 6.2 oz (66.4 kg)  Temp (24hrs), Avg:98.3 F (36.8 C), Min:98 F (36.7 C), Max:98.6 F (37 C)  Recent Labs  Lab 07/26/19 1306 07/27/19 0710 07/28/19 0526  WBC 12.0* 9.7 11.4*  CREATININE 4.65* 2.67* 1.11*  LATICACIDVEN 0.8 3.6*  --   VANCORANDOM  --   --  14    Estimated Creatinine Clearance: 46.9 mL/min (A) (by C-G formula based on SCr of 1.11 mg/dL (H)).    Allergies  Allergen Reactions  . Sulfa Antibiotics Shortness Of Breath and Swelling    Antimicrobials this admission:  vancomycin 2/7 >>  Piperacillin/tazobactam 2/6 >>   Dose adjustments this admission:  2/8 VR 14. vancomycin variable off of randoms >> vanc 1 g q24h 2/7 zosyn 2.25 q6h --> 3.375 q8h EI  Microbiology results:  2/7 BCx: ngtd 2/7 UCx: Sent  2/6 SARS-2: negative 2/6 Influenza: Negative  Lenis Noon, PharmD 07/28/2019 8:18 AM

## 2019-07-28 NOTE — Progress Notes (Signed)
Pharmacy Antibiotic Note  AKELA SCHILLING is a 64 y.o. female presented to the ED on 07/26/2019 with c/o abdominal pain, constipation and decreased urine output. In the ED, she was found to have AKI and was started on abx for suspected sepsis.   2/9 - day #2 of vancomycin and zosyn -the am scr labs are pending  Plan: - patient received a total of vancomycin 1500 mg dose on 2/6. 2/8 0526 VR = 14. - Will anticipate renal function to continue to improve with bicarb drip --> adjust zosyn to 3.375 gm IV q8h (infuse over 4 hrs) - monitor renal function closely -Give Vancomycin 1 gm x1 this am. F/u scr when labs back.   _____________________________  Weight: 146 lb 6.2 oz (66.4 kg)  Temp (24hrs), Avg:98.3 F (36.8 C), Min:98 F (36.7 C), Max:98.6 F (37 C)  Recent Labs  Lab 07/26/19 1306 07/27/19 0710 07/28/19 0526  WBC 12.0* 9.7  --   CREATININE 4.65* 2.67*  --   LATICACIDVEN 0.8 3.6*  --   VANCORANDOM  --   --  14    Estimated Creatinine Clearance: 19.5 mL/min (A) (by C-G formula based on SCr of 2.67 mg/dL (H)).    Allergies  Allergen Reactions  . Sulfa Antibiotics Shortness Of Breath and Swelling    Thank you for allowing pharmacy to be a part of this patient's care.  Dorrene German 07/28/2019 6:13 AM

## 2019-07-29 LAB — BASIC METABOLIC PANEL
Anion gap: 10 (ref 5–15)
BUN: 27 mg/dL — ABNORMAL HIGH (ref 8–23)
CO2: 24 mmol/L (ref 22–32)
Calcium: 6.2 mg/dL — CL (ref 8.9–10.3)
Chloride: 109 mmol/L (ref 98–111)
Creatinine, Ser: 0.62 mg/dL (ref 0.44–1.00)
GFR calc Af Amer: >60 mL/min (ref >60)
GFR calc non Af Amer: >60 mL/min (ref >60)
Glucose, Bld: 105 mg/dL — ABNORMAL HIGH (ref 70–99)
Potassium: 2.2 mmol/L — CL (ref 3.5–5.1)
Sodium: 143 mmol/L (ref 135–145)

## 2019-07-29 LAB — CBC WITH DIFFERENTIAL/PLATELET
Abs Immature Granulocytes: 0.07 10*3/uL (ref 0.00–0.07)
Basophils Absolute: 0.1 10*3/uL (ref 0.0–0.1)
Basophils Relative: 1 %
Eosinophils Absolute: 0.8 10*3/uL — ABNORMAL HIGH (ref 0.0–0.5)
Eosinophils Relative: 6 %
HCT: 32.9 % — ABNORMAL LOW (ref 36.0–46.0)
Hemoglobin: 11 g/dL — ABNORMAL LOW (ref 12.0–15.0)
Immature Granulocytes: 1 %
Lymphocytes Relative: 22 %
Lymphs Abs: 2.9 10*3/uL (ref 0.7–4.0)
MCH: 27.4 pg (ref 26.0–34.0)
MCHC: 33.4 g/dL (ref 30.0–36.0)
MCV: 82 fL (ref 80.0–100.0)
Monocytes Absolute: 1 10*3/uL (ref 0.1–1.0)
Monocytes Relative: 7 %
Neutro Abs: 8.8 10*3/uL — ABNORMAL HIGH (ref 1.7–7.7)
Neutrophils Relative %: 63 %
Platelets: 385 10*3/uL (ref 150–400)
RBC: 4.01 MIL/uL (ref 3.87–5.11)
RDW: 16 % — ABNORMAL HIGH (ref 11.5–15.5)
WBC: 13.6 10*3/uL — ABNORMAL HIGH (ref 4.0–10.5)
nRBC: 0 % (ref 0.0–0.2)

## 2019-07-29 LAB — LIPASE, BLOOD: Lipase: 120 U/L — ABNORMAL HIGH (ref 11–51)

## 2019-07-29 LAB — GLUCOSE, CAPILLARY
Glucose-Capillary: 100 mg/dL — ABNORMAL HIGH (ref 70–99)
Glucose-Capillary: 150 mg/dL — ABNORMAL HIGH (ref 70–99)
Glucose-Capillary: 132 mg/dL — ABNORMAL HIGH (ref 70–99)
Glucose-Capillary: 147 mg/dL — ABNORMAL HIGH (ref 70–99)

## 2019-07-29 LAB — MAGNESIUM: Magnesium: 0.6 mg/dL — CL (ref 1.7–2.4)

## 2019-07-29 MED ORDER — KCL-LACTATED RINGERS 20 MEQ/L IV SOLN
INTRAVENOUS | Status: DC
  Filled 2019-07-29: qty 1000

## 2019-07-29 MED ORDER — FERROUS SULFATE 325 (65 FE) MG PO TABS
325.0000 mg | ORAL_TABLET | Freq: Every day | ORAL | Status: DC
  Administered 2019-07-29 – 2019-08-03 (×5): 325 mg via ORAL
  Filled 2019-07-29 (×6): qty 1

## 2019-07-29 MED ORDER — POTASSIUM CHLORIDE 2 MEQ/ML IV SOLN
INTRAVENOUS | Status: DC
  Filled 2019-07-29 (×4): qty 1000

## 2019-07-29 MED ORDER — SODIUM CHLORIDE 0.9 % IV SOLN
2.0000 g | Freq: Two times a day (BID) | INTRAVENOUS | Status: DC
  Administered 2019-07-29: 2 g via INTRAVENOUS
  Filled 2019-07-29 (×2): qty 2

## 2019-07-29 MED ORDER — PRO-STAT SUGAR FREE PO LIQD
30.0000 mL | Freq: Two times a day (BID) | ORAL | Status: DC
  Administered 2019-07-29 – 2019-08-02 (×7): 30 mL via ORAL
  Filled 2019-07-29 (×4): qty 30

## 2019-07-29 MED ORDER — ADULT MULTIVITAMIN W/MINERALS CH
1.0000 | ORAL_TABLET | Freq: Every day | ORAL | Status: DC
  Administered 2019-07-29 – 2019-08-01 (×3): 1 via ORAL
  Filled 2019-07-29 (×6): qty 1

## 2019-07-29 MED ORDER — CALCIUM GLUCONATE-NACL 1-0.675 GM/50ML-% IV SOLN
1.0000 g | Freq: Once | INTRAVENOUS | Status: AC
  Administered 2019-07-29: 1000 mg via INTRAVENOUS
  Filled 2019-07-29: qty 50

## 2019-07-29 MED ORDER — SODIUM CHLORIDE 0.9 % IV SOLN
INTRAVENOUS | Status: DC | PRN
  Administered 2019-07-29: 1000 mL via INTRAVENOUS
  Administered 2019-07-31: 240 mL via INTRAVENOUS
  Administered 2019-08-01: 250 mL via INTRAVENOUS

## 2019-07-29 MED ORDER — SODIUM CHLORIDE 0.9 % IV SOLN
2.0000 g | Freq: Two times a day (BID) | INTRAVENOUS | Status: AC
  Administered 2019-07-29 – 2019-07-30 (×2): 2 g via INTRAVENOUS
  Filled 2019-07-29 (×2): qty 2

## 2019-07-29 MED ORDER — SODIUM CHLORIDE 0.9 % IV SOLN
2.0000 g | Freq: Three times a day (TID) | INTRAVENOUS | Status: DC
  Filled 2019-07-29 (×2): qty 2

## 2019-07-29 MED ORDER — ONDANSETRON HCL 4 MG/2ML IJ SOLN
4.0000 mg | Freq: Four times a day (QID) | INTRAMUSCULAR | Status: DC | PRN
  Administered 2019-07-29: 4 mg via INTRAVENOUS
  Filled 2019-07-29: qty 2

## 2019-07-29 MED ORDER — ENSURE ENLIVE PO LIQD
237.0000 mL | Freq: Two times a day (BID) | ORAL | Status: DC
  Administered 2019-08-01 (×2): 237 mL via ORAL

## 2019-07-29 MED ORDER — METRONIDAZOLE IN NACL 5-0.79 MG/ML-% IV SOLN
500.0000 mg | Freq: Three times a day (TID) | INTRAVENOUS | Status: AC
  Administered 2019-07-29 – 2019-07-30 (×3): 500 mg via INTRAVENOUS
  Filled 2019-07-29 (×3): qty 100

## 2019-07-29 MED ORDER — MAGNESIUM SULFATE 2 GM/50ML IV SOLN
2.0000 g | Freq: Once | INTRAVENOUS | Status: AC
  Administered 2019-07-29: 2 g via INTRAVENOUS
  Filled 2019-07-29: qty 50

## 2019-07-29 MED ORDER — MAGNESIUM SULFATE 4 GM/100ML IV SOLN
4.0000 g | Freq: Once | INTRAVENOUS | Status: AC
  Administered 2019-07-29: 4 g via INTRAVENOUS
  Filled 2019-07-29: qty 100

## 2019-07-29 MED ORDER — FERROUS SULFATE 325 (65 FE) MG PO TABS: 325.0000 mg | ORAL_TABLET | Freq: Every day | ORAL | Status: DC

## 2019-07-29 MED ORDER — POTASSIUM CHLORIDE 10 MEQ/100ML IV SOLN
10.0000 meq | INTRAVENOUS | Status: AC
  Administered 2019-07-29 (×6): 10 meq via INTRAVENOUS
  Filled 2019-07-29 (×7): qty 100

## 2019-07-29 NOTE — Progress Notes (Signed)
PT Cancellation Note  Patient Details Name: Jocelyn Sanchez MRN: [id number] DOB: 08/22/1955   Cancelled Treatment:    Reason Eval/Treat Not Completed: PT screened, no needs identified, will sign off. Order received. Chart received. Per chart review, pt is total care at baseline and requires a hoyer lift for OOB/mobility (has been since, at least, 2016). Pt is not appropriate for acute PT services. Will sign off.    Doreatha Massed, PT Acute Rehabilitation

## 2019-07-29 NOTE — TOC Progression Note (Signed)
Transition of Care [facility]) - Progression Note    Patient Details  Name: Jocelyn Sanchez MRN: [id number] Date of Birth: 1956-05-30  Transition of [facility]) CM/SW Contact  Dariella Gillihan, Juliann Pulse, RN Phone Number: 07/29/2019, 3:52 PM  Clinical Narrative:  Currently active w/[facility] nursing rep Santiago Glad following. PTAR @ d/c.     Expected Discharge Plan: [place] Barriers to Discharge: Continued Medical Work up  Expected Discharge Plan and Services Expected Discharge Plan: [place]   Discharge Planning Services: CM Consult   Living arrangements for the past 2 months: Single Family Home                           HH Arranged: RN [facility] Agency: [place] ([facility]) Date [place]: 07/29/19 Time [place]: [id number] Representative spoke with at [place]: [place] ([place]) Interventions    Readmission Risk Interventions Readmission Risk Prevention Plan 07/29/2019  Transportation Screening Complete  PCP or Specialist Appt within 3-5 Days Complete  HRI or [place] Complete  Social Work Consult for [place] Planning/Counseling Complete  Palliative Care Screening Complete  Medication Review Press photographer) Complete  Some recent data might be hidden

## 2019-07-29 NOTE — Progress Notes (Signed)
Initial Nutrition Assessment  DOCUMENTATION CODES:   Not applicable  INTERVENTION:  - will order Ensure Enlive BID, each supplement provides 350 kcal and 20 grams of protein. - will order Magic Cup BID with meals, each supplement provides 290 kcal and 9 grams of protein. - will order 30 mL Prostat BID, each supplement provides 100 kcal and 15 grams of protein. - will order daily multivitamin with minerals. - continue to encourage PO intakes as tolerated.    NUTRITION DIAGNOSIS:   Increased nutrient needs related to acute illness, wound healing as evidenced by estimated needs.  GOAL:   Patient will meet greater than or equal to 90% of their needs  MONITOR:   PO intake, Supplement acceptance, Labs, Weight trends, Skin  REASON FOR ASSESSMENT:   Other (Comment)(Pressure Injury report)  ASSESSMENT:   64 y.o. female with medical history of anemia, type 2 DM, gait disorder, HTN, GI bleed, gastric and duodenal angiodysplasia, lymphedema, multiple sclerosis, and hx of pulmonary embolism on Eliquis. She presented to the ED with complaints of constipation associated with abdominal pain. She had not had bowel movement for 48 hours, had decreased urinary output for 24 hours, and was feeling uncomfortable.  Unable to see patient at the time of attempted visit d/t need for direct care from RN. Diet advanced from NPO to Carb Modified on 2/7 at 0910, changed back to NPO at 1135, advanced to CLD on 2/8 at 0800 and then to Soft today at 0800. Per flow sheet documentation, she consumed 5% of breakfast on 2/7 (Carb Mod) and 0% of breakfast this AM.  RN note from a short time ago states that patient received breakfast and lunch trays today, was feeling nauseated around those times, and received IV zofran.   Per chart review, weight today is 135 lb and weight on 03/31/19 documented as 146 lb. This indicates 11 lb weight loss (7.5% body weight) in the past 4 months; not significant for time frame, but  unsure if weight loss may have occurred more acutely.   Able to talk with Pharmacist through secure chat about hypokalemia and hypomagnesemia. She had already been able to talk with MD about these labs and repletion orders this AM.   Per notes: - severe sepsis--improving - CT abdomen/pelvis showed suspected acute pancreatitis with large stool burden in the rectum and possible colitis - hypokalemia and hypomagnesemia--repletion ordered - UTI and suspected L pyelonephritis - hx of chronic constipation - acute blood loss anemia--FOBT positive - AKI--resolving    Labs reviewed; HgbA1c: 6.7% (07/27/19), CBGs: 100, 150 mg/dl, K: 2.2 mmol/l, BUN: 27 mg/dl, Ca: 6.2 mg/dl, Mg: 0.6 mg/dl, lipase: 120 u/l. Medications reviewed; 10 mg pepcid/day, sliding scale novolog, 10 mEq IV KCl x4 runs 2/8, 250 mg mg florastor BID.  IVF; LR-20 mEq KCl @ 50 ml/hr.    NUTRITION - FOCUSED PHYSICAL EXAM:  unable to complete at this time.   Diet Order:   Diet Order            DIET SOFT Room service appropriate? Yes; Fluid consistency: Thin  Diet effective now              EDUCATION NEEDS:   Not appropriate for education at this time  Skin:  Skin Assessment: Skin Integrity Issues: Skin Integrity Issues:: Stage III Stage III: coccyx/sacrum  Last BM:  2/9  Height:   Ht Readings from Last 1 Encounters:  03/28/19 5\' 3"  (1.6 m)    Weight:   Wt Readings from Last  1 Encounters:  07/29/19 61.5 kg    Ideal Body Weight:  52.3 kg  BMI:  Body mass index is 24.02 kg/m.  Estimated Nutritional Needs:   Kcal:  1845-2150 kcal  Protein:  100-115 grams  Fluid:  >/= 2 L/day     Jarome Matin, MS, RD, LDN, CNSC Inpatient Clinical Dietitian RD pager # available in AMION  After hours/weekend pager # available in [facility]

## 2019-07-29 NOTE — Progress Notes (Signed)
CRITICAL VALUE ALERT  Critical Value:  Magnesium 0.6  Date & Time Notied:  07/29/19  Provider Notified: [place] Crane  Orders Received/Actions taken: pt has been assessed by provider labs have been reviewed, see orders

## 2019-07-29 NOTE — Care Management Important Message (Signed)
Important Message  Patient Details IM Letter given to [name] RN Case Manager to present to the [name] MRN: [id number] Date of Birth: 23-Feb-1956   Medicare Important Message Given:  Yes     Kerin Salen 07/29/2019, 12:37 PM

## 2019-07-29 NOTE — Progress Notes (Signed)
Patient has received breakfast and lunch. With each meal pt has complained of nausea. Zofran administered IV, will continue to monitor and reassess.

## 2019-07-29 NOTE — TOC Initial Note (Signed)
Transition of Care [facility]) - Initial/Assessment Note    Patient Details  Name: Jocelyn Sanchez MRN: [id number] Date of Birth: [birth year]  Transition of Care [facility]) CM/SW Contact:    [name], RN Phone Number: 07/29/2019, 3:30 PM  Clinical Narrative: Patient plans to d/c back home,w/c bound,has pvt duty,has family support-The son Vicente Males defers to patient's sister Hilda Blades [id number]. Active w/[facility] RN/PT.[employer] to explore w/issues with family.Will need PTAR @ d/c.                   Expected Discharge Plan: [place] Barriers to Discharge: Continued Medical Work up   Patient Goals and CMS Choice Patient states their goals for this hospitalization and ongoing recovery are:: go home      Expected Discharge Plan and Services Expected Discharge Plan: [place]   Discharge Planning Services: CM Consult   Living arrangements for the past 2 months: Single Family Home                                      Prior Living Arrangements/Services Living arrangements for the past 2 months: Single Family Home Lives with:: Relatives Patient language and need for interpreter reviewed:: Yes Do you feel safe going back to the place where you live?: Yes      Need for Family Participation in Patient Care: No (Comment) Care giver support system in place?: Yes (comment) Current home services: DME, Other (comment)([employer] services-pvt duty aides/day & night) Criminal Activity/Legal Involvement Pertinent to Current Situation/Hospitalization: No - Comment as needed  Activities of Daily Living Home Assistive Devices/Equipment: None ADL Screening (condition at time of admission) Patient's cognitive ability adequate to safely complete daily activities?: Yes Is the patient deaf or have difficulty hearing?: No Does the patient have difficulty seeing, even when wearing glasses/contacts?: No Does the patient have difficulty concentrating, remembering, or making  decisions?: No Patient able to express need for assistance with ADLs?: Yes Does the patient have difficulty dressing or bathing?: Yes Independently performs ADLs?: No Communication: Needs assistance Is this a change from baseline?: Pre-admission baseline Dressing (OT): Needs assistance Is this a change from baseline?: Pre-admission baseline Feeding: Needs assistance Is this a change from baseline?: Pre-admission baseline Bathing: Needs assistance Is this a change from baseline?: Pre-admission baseline Toileting: Needs assistance Is this a change from baseline?: Pre-admission baseline In/Out Bed: Needs assistance Is this a change from baseline?: Pre-admission baseline Walks in Home: Needs assistance Is this a change from baseline?: Pre-admission baseline Does the patient have difficulty walking or climbing stairs?: Yes Weakness of Legs: Both Weakness of Arms/Hands: Both  Permission Sought/Granted Permission sought to share information with : Case Manager Permission granted to share information with : Yes, Verbal Permission Granted  Share Information with NAME: Case manager           Emotional Assessment Appearance:: Appears stated age Attitude/Demeanor/Rapport: Gracious Affect (typically observed): Accepting Orientation: : Oriented to Self, Oriented to Place, Oriented to  Time, Oriented to Situation Alcohol / Substance Use: Not Applicable Psych Involvement: No (comment)  Admission diagnosis:  Sepsis ([place]) [A41.9] Abdominal pain, unspecified abdominal location [R10.9] Patient Active Problem List   Diagnosis Date Noted  . Sepsis ([place]) 07/26/2019  . Metabolic acidosis   . Diabetic acidosis without coma ([place])   . GI bleed 02/10/2019  . Proteus mirabilis infection 08/14/2017  . Diarrhea 08/14/2017  .  Bacteremia 08/14/2017  . Gastric and duodenal angiodysplasia   . Esophageal stricture   . Gastroesophageal reflux disease with esophagitis   . Hx of pulmonary embolus  10/26/2015  . PVD (peripheral vascular disease) ([place]) 07/04/2015  . Essential hypertension, benign 06/17/2015  . Decubitus ulcer of ankle, stage 4 ([place]) 05/10/2015  . Fever 03/08/2015  . Septic shock ([place]) 03/06/2015  . Diabetes mellitus with peripheral vascular disease ([place]) 03/06/2015  . Leukocytosis 03/06/2015  . Anemia of chronic disease 03/06/2015  . Pressure injury of sacral region, stage 3 ([place]) 03/06/2015  . Lymphedema 03/06/2015  . Sepsis with acute renal failure ([place]) 03/01/2015  . Urinary tract infection with hematuria 11/28/2014  . Multiple sclerosis ([place]) 05/11/2011   PCP:  Dixie Dials, MD Pharmacy:   [id number] - [place], [place] [id number]  [place] [id number] Phone: [phone] Fax: [phone]     Social Determinants of Health (SDOH) Interventions    Readmission Risk Interventions No flowsheet data found.

## 2019-07-29 NOTE — Progress Notes (Signed)
CRITICAL VALUE ALERT  Critical Value:  K-2.2, calcium-6.2  Date & Time Notied: 07/29/2019 at 0619  Provider Notified: Stark Klein NP paged at [phone]  Orders Received/Actions taken:  Waiting for response

## 2019-07-29 NOTE — Progress Notes (Signed)
PROGRESS NOTE  LUCIELLA Sanchez [id number] DOB: 07-29-1955 DOA: 07/26/2019 PCP: Dixie Dials, MD  HPI/Recap of past 24 hours: Jocelyn Sanchez is a 64 y.o. female with medical history significant for chronic anemia, type 2 diabetes mellitus, gait disorder, hypertension, GI bleed, gastric and duodenal angiodysplasia, lymphedema, multiple sclerosis, history of pulmonary embolism on Eliquis, who presented to the emergency department with complaints of constipation associated with abdominal pain.  She had not had bowel movement for 48 hours and was feeling uncomfortable.  Also has complaint of decreased urinary output over the last 24 hours.   ED Course: K+ 6.6 sodium 126 white count 12.0 hemoglobin 7.7 and lactic acid was however normal, she was also found to be dehydrated.  Blood cultures were obtained.  Elevated creatinine with acute kidney injury in addition to the hyperkalemia.  EDMD consulted with nephrology who advised to start her on bicarb infusion and hydration.  Hospital course complicated by diarrhea,  C. difficile antigen positive.  Toxin negative.  PCR pending.  Drop in hemoglobin with positive FOBT. Hg down to 6.5.  Post 2U Prbcs increased to 12.0?.  07/29/19: Seen and examined.  Lipase level trending down.  States she is hungry and wants solid food.  She denies nausea or abdominal pain.  Diet changed to soft.  Later, she reported nausea.  Downgraded her diet to full liquid.   Assessment/Plan: Principal Problem:   Septic shock (HCC) Active Problems:   Multiple sclerosis (HCC)   Diabetes mellitus with peripheral vascular disease ([place])   Leukocytosis   Sepsis ([place])  Resolved severe sepsis likely multifactorial secondary to presumed UTI with suspected intra-abdominal infection Presented with leukocytosis, tachycardia, diffuse abdominal pain, hypotension, elevated lactic acid 3.6. Sepsis physiology is improving. CT abdomen and pelvis without contrast showed suspected acute  pancreatitis.  Large stool burden in the rectum with circumferential rectal wall thickening muscular hypertrophy possibly indicating mild stercoral colitis, suspected left pyelonephritis. UA positive, urine culture reincubated for better growth. MRSA negative Blood cultures negative to date. Started on IV Zosyn and IV vancomycin empirically Switched to cefepime and IV Flagyl on 07/28/2019 Will complete 5 days total of IV antibiotics. Obtain CBC with differential and monitor fever curve.  Possible mild stercoral colitis on CT Hx of chronic constipation Seen on CT abd pelvis without contrast GI [place] following Received water enema  Hx of C-diff diarrhea Stool sample Cdiff antigen positive, PCR is pending Diarrhea is improving this am Continue florastor Continue to monitor electrolytes and signs of dehydration  Refractory hypokalemia, multifactorial secondary to bicarb infusion and diarrhea.  K+ 2.2 Repleted Repeat levels this afternoon  Severe hypomagnesemia Possibly from GI losses with diarrhea Magnesium 0.6 Repleted with IV magnesium 4 g x 2.  Improving acute pancreatitis, unclear etiology No gallstone, no alcohol use, no medication that may have caused Mildly elevated triglyceride, likely not the cause Elevated lipase >170>> 2700>> 650>> 120 Started on soft diet today.  Due to nausea will de-escalate down to full liquid diet.  Continue supportive care.  Continue gentle IV fluid hydration.  Continue to closely monitor electrolytes and volume status in the setting of chronic heart failure with reduced EF.  No evidence of gallstone or biliary duct dilatation on CT Slight inflammation adjacent to the proximal body of the pancreas and adjacent to the uncinate process of the pancreas on CT abdomen and pelvis No evidence of pancreatic mass  Presumed UTI with suspected left pyelonephritis Positive UA for pyuria Urine culture reintubated for better growth.,  Continue to follow  cultures. Continue empiric antibiotic.  Will complete 5 days. Blood cultures negative to date.  Chronic constipation now with possible stercoral colitis  Stercoral colitis management per GI.  Resolved AKI likely prerenal in the setting of poor oral intake and dehydration Hypovolemic on exam Presented with creatinine of 4.65 Creatinine back to baseline of 0.6 with GFR greater than 60. Continue to avoid nephrotoxins, dehydration and hypotension Monitor urine output Daily BMPs  Multiple bilateral renal calculi including partial staghorn calculi on CT abdomen and pelvis Monitor urine output and symptomatology  Resolved anion gap metabolic acidosis likely secondary to acute renal failure Presented with serum bicarb of 10 and anion gap of 14>> serum bicarb 26 and anion gap of 13. DC bicarb drip on 07/28/2019.  Acute blood loss, unclear etiology Positive FOBT with no overt signs of bleeding Iron studies indicative of iron deficiency.  Continue ferrous sulfate 325 mg daily. Hemoglobin dropped to 6.6, this was confirmed with repeated H&H. Post 2 unit PRBC transfusion hemoglobin 12, lab error? overcorrection?.    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  Chronic systolic CHF with LVEF [id number] Last 2D echo done on 08/09/2017 showed LVEF 40-45% with diffuse hypokinesis Closely monitor volume status while on IV fluid to avoid complications such as pulmonary edema or bilateral pleural effusions Continue Strict I's and O's and daily weight  History of PE Stable Continue Eliquis  Type 2 diabetes A1c 6.7 on 07/27/19 Continue insulin sliding scale  History of MS No acute issues   DVT prophylaxis: Eliquis/SCD  Code Status: Full code personally confirmed with patient  Family Communication: None at bedside  Disposition  Plan:  Patient is from home.  Anticipate discharge to home in the next 48 to 72 hours.  Barrier to discharge: Sepsis with ongoing IV antibiotics treatment, possible stercoral colitis.  Consults called: Nephrology Per EDMD, GI [place].    Objective: Vitals:   07/28/19 1314 07/28/19 2134 07/29/19 0650 07/29/19 1407  BP: (!) 97/51 111/71 99/67 (!) 124/93  Pulse: 90 (!) 101 97 (!) 104  Resp: 18 20 18 16   Temp: 98.7 F (37.1 C) 99.2 F (37.3 C) 99.3 F (37.4 C) 98.7 F (37.1 C)  TempSrc: Oral Oral Oral Oral  SpO2: 100% 100% 100% 100%  Weight:   61.5 kg     Intake/Output Summary (Last 24 hours) at 07/29/2019 1433 Last data filed at 07/29/2019 1036 Gross per 24 hour  Intake 1573.75 ml  Output 200 ml  Net 1373.75 ml   Filed Weights   07/26/19 2300 07/29/19 0650  Weight: 66.4 kg 61.5 kg    Exam:  . General: 64 y.o. year-old female well-developed Well-Nourished No Acute Distress.  Alert and interactive.   . Cardiovascular: Regular rate and rhythm no rubs or gallops. Marland Kitchen Respiratory: Clear consultation no wheezes no rales.   Abdomen: No tenderness with palpation bowel sounds present.  Musculoskeletal: Trace lower extremity edema bilaterally.   Psychiatry: Mood is appropriate for condition and setting.  Data Reviewed: CBC: Recent Labs  Lab 07/26/19 1306 07/26/19 1306 07/27/19 0710 07/27/19 0753 07/27/19 1904 07/28/19 0526 07/29/19 0522  WBC 12.0*  --  9.7  --   --  11.4* 13.6*  NEUTROABS 9.7*  --   --   --   --  8.8* 8.8*  HGB 7.7*   < > 6.5* 6.6* 12.9 12.0 11.0*  HCT 24.7*   < > 20.2* 20.3* 37.5 35.6* 32.9*  MCV 81.3  --  78.0*  --   --  79.5* 82.0  PLT 545*  --  451*  --   --  419* 385   < > = values in this interval not displayed.   Basic Metabolic Panel: Recent Labs  Lab 07/26/19 1306 07/27/19 0710 07/28/19 0526 07/28/19 1950 07/29/19 0522 07/29/19 0552  NA 126* 135 142  --  143  --   K 6.6* 3.5 2.2* 2.9* 2.2*  --   CL 102 107 103  --  109  --   CO2 10*  13* 26  --  24  --   GLUCOSE 134* 357* 272*  --  105*  --   BUN 100* 104* 60*  --  27*  --   CREATININE 4.65* 2.67* 1.11*  --  0.62  --   CALCIUM 8.7* 7.8* 7.0*  --  6.2*  --   MG  --   --   --   --   --  0.6*   GFR: Estimated Creatinine Clearance: 58.8 mL/min (by C-G formula based on SCr of 0.62 mg/dL). Liver Function Tests: Recent Labs  Lab 07/26/19 1306 07/27/19 0710  AST 10* 13*  ALT 9 7  ALKPHOS 90 63  BILITOT 1.2 0.7  PROT 8.1 6.3*  ALBUMIN 3.3* 2.6*   Recent Labs  Lab 07/26/19 1306 07/27/19 0753 07/28/19 0526 07/29/19 0522  LIPASE 152* 2,747* 650* 120*   No results for input(s): AMMONIA in the last 168 hours. Coagulation Profile: Recent Labs  Lab 07/27/19 0710  INR 1.8*   Cardiac Enzymes: No results for input(s): CKTOTAL, CKMB, CKMBINDEX, TROPONINI in the last 168 hours. BNP (last 3 results) No results for input(s): PROBNP in the last 8760 hours. HbA1C: Recent Labs    07/27/19 0705  HGBA1C 6.7*   CBG: Recent Labs  Lab 07/28/19 1153 07/28/19 1631 07/28/19 2127 07/29/19 0730 07/29/19 1140  GLUCAP 246* 256* 180* 100* 150*   Lipid Profile: Recent Labs    07/28/19 1950  TRIG 167*   Thyroid Function Tests: No results for input(s): TSH, T4TOTAL, FREET4, T3FREE, THYROIDAB in the last 72 hours. Anemia Panel: Recent Labs    07/27/19 0710  FERRITIN 121  TIBC 185*  IRON 19*   Urine analysis:    Component Value Date/Time   COLORURINE YELLOW 07/26/2019 1551   APPEARANCEUR TURBID (A) 07/26/2019 1551   LABSPEC 1.015 07/26/2019 1551   PHURINE 6.0 07/26/2019 1551   GLUCOSEU NEGATIVE 07/26/2019 1551   HGBUR LARGE (A) 07/26/2019 1551   BILIRUBINUR NEGATIVE 07/26/2019 1551   KETONESUR 5 (A) 07/26/2019 1551   PROTEINUR 100 (A) 07/26/2019 1551   UROBILINOGEN 0.2 03/01/2015 1913   NITRITE NEGATIVE 07/26/2019 1551   LEUKOCYTESUR MODERATE (A) 07/26/2019 1551   Sepsis Labs: @LABRCNTIP (procalcitonin:4,lacticidven:4)  ) Recent Results (from the  past 240 hour(s))  Culture, blood (routine x 2)     Status: None (Preliminary result)   Collection Time: 07/26/19  1:00 PM   Specimen: BLOOD RIGHT FOREARM  Result Value Ref Range Status   Specimen Description   Final    BLOOD RIGHT FOREARM Performed at [facility] [address] [place], [place] [address]    Special Requests   Final    BAA Blood Culture adequate volume Performed at [facility] [address], [place], [place] [address]    Culture   Final    NO  GROWTH 3 DAYS Performed at [facility], [place] [address] [place], [place] [address]    Report Status PENDING  Incomplete  Respiratory Panel by RT PCR (Flu A&B, Covid) - Nasopharyngeal Swab     Status: None   Collection Time: 07/26/19  7:38 PM   Specimen: Nasopharyngeal Swab  Result Value Ref Range Status   SARS Coronavirus 2 by RT PCR NEGATIVE NEGATIVE Final    Comment: (NOTE) SARS-CoV-2 target nucleic acids are NOT DETECTED. The SARS-CoV-2 RNA is generally detectable in upper respiratoy specimens during the acute phase of infection. The lowest concentration of SARS-CoV-2 viral copies this assay can detect is 131 copies/mL. A negative result does not preclude SARS-Cov-2 infection and should not be used as the sole basis for treatment or other patient management decisions. A negative result may occur with  improper specimen collection/handling, submission of specimen other than nasopharyngeal swab, presence of viral mutation(s) within the areas targeted by this assay, and inadequate number of viral copies (<131 copies/mL). A negative result must be combined with clinical observations, patient history, and epidemiological information. The expected result is Negative. Fact Sheet for Patients:  PinkCheek.be Fact Sheet for Healthcare Providers:  GravelBags.it This test is not yet ap proved or cleared by the [place]  FDA and  has been authorized for detection and/or diagnosis of SARS-CoV-2 by FDA under an Emergency Use Authorization (EUA). This EUA will remain  in effect (meaning this test can be used) for the duration of the COVID-19 declaration under Section 564(b)(1) of the Act, 21 U.S.C. section 360bbb-3(b)(1), unless the authorization is terminated or revoked sooner.    Influenza A by PCR NEGATIVE NEGATIVE Final   Influenza B by PCR NEGATIVE NEGATIVE Final    Comment: (NOTE) The Xpert Xpress SARS-CoV-2/FLU/RSV assay is intended as an aid in  the diagnosis of influenza from Nasopharyngeal swab specimens and  should not be used as a sole basis for treatment. Nasal washings and  aspirates are unacceptable for Xpert Xpress SARS-CoV-2/FLU/RSV  testing. Fact Sheet for Patients: PinkCheek.be Fact Sheet for Healthcare Providers: GravelBags.it This test is not yet approved or cleared by the [place] FDA and  has been authorized for detection and/or diagnosis of SARS-CoV-2 by  FDA under an Emergency Use Authorization (EUA). This EUA will remain  in effect (meaning this test can be used) for the duration of the  Covid-19 declaration under Section 564(b)(1) of the Act, 21  U.S.C. section 360bbb-3(b)(1), unless the authorization is  terminated or revoked. Performed at [facility], [place] [address], [place], [place] [address]   Culture, blood (routine x 2)     Status: None (Preliminary result)   Collection Time: 07/27/19  7:10 AM   Specimen: BLOOD  Result Value Ref Range Status   Specimen Description   Final    BLOOD RIGHT HAND Performed at [facility] [address], [place], [place] [address]    Special Requests   Final    BOTTLES DRAWN AEROBIC AND ANAEROBIC Blood Culture adequate volume Performed at [facility] [address], [place], [place] [address]    Culture   Final      NO GROWTH 2 DAYS Performed at [facility] [address] [place], [place] [address]    Report Status PENDING  Incomplete  C difficile quick scan w PCR reflex     Status: Abnormal   Collection Time: 07/27/19  7:52 AM   Specimen: STOOL  Result Value Ref Range Status   C Diff antigen POSITIVE (A) NEGATIVE Final   C Diff toxin NEGATIVE NEGATIVE Final   C Diff interpretation Results are indeterminate. See PCR results.  Final    Comment: Performed at [facility], [place] [address], [place], [place] [address]  Culture, Urine     Status: None (Preliminary result)   Collection Time: 07/28/19 11:32 AM   Specimen: Urine, Clean Catch  Result Value Ref Range Status   Specimen Description   Final    URINE, CLEAN CATCH Performed at [facility], [place] [address] [place], [place] [address]    Special Requests   Final    Normal Performed at [facility], [place] [address] [place], [place] [address]    Culture   Final    CULTURE REINCUBATED FOR BETTER GROWTH Performed at [facility], [place] [address], [place], [place] [address]    Report Status PENDING  Incomplete  MRSA PCR Screening     Status: None   Collection Time: 07/28/19  4:53 PM   Specimen: Urine, Clean Catch; Nasopharyngeal  Result Value Ref Range Status   MRSA by PCR NEGATIVE NEGATIVE Final    Comment:        The GeneXpert MRSA Assay (FDA approved for NASAL specimens only), is one component of a comprehensive MRSA colonization surveillance program. It is not intended to diagnose MRSA infection nor to guide or monitor treatment for MRSA infections. Performed at [facility], [place] [address], [place], [place] [address]       Studies: No results found.  Scheduled Meds: . apixaban  5 mg Oral BID  . famotidine  10 mg Oral Daily  . feeding supplement (ENSURE ENLIVE)  237 mL Oral BID BM  . feeding supplement (PRO-STAT SUGAR FREE 64)  30 mL  Oral BID  . insulin aspart  0-9 Units Subcutaneous TID WC  . multivitamin with minerals  1 tablet Oral Daily  . saccharomyces boulardii  250 mg Oral BID    Continuous Infusions: . ceFEPime (MAXIPIME) IV 2 g (07/29/19 1044)  . lactated ringers with kcl 50 mL/hr at 07/29/19 0938  . magnesium sulfate bolus IVPB    . metronidazole 500 mg (07/29/19 1404)     LOS: 3 days     Kayleen Memos, MD [facility] Pager [phone]  If 7PM-7AM, please contact night-coverage [email or web address] Password [facility] 07/29/2019, 2:33 PM

## 2019-07-30 DIAGNOSIS — D72829 Elevated white blood cell count, unspecified: Secondary | ICD-10-CM

## 2019-07-30 LAB — BASIC METABOLIC PANEL
Anion gap: 9 (ref 5–15)
BUN: 15 mg/dL (ref 8–23)
CO2: 18 mmol/L — ABNORMAL LOW (ref 22–32)
Calcium: 6.8 mg/dL — ABNORMAL LOW (ref 8.9–10.3)
Chloride: 111 mmol/L (ref 98–111)
Creatinine, Ser: 0.5 mg/dL (ref 0.44–1.00)
GFR calc Af Amer: >60 mL/min (ref >60)
GFR calc non Af Amer: >60 mL/min (ref >60)
Glucose, Bld: 136 mg/dL — ABNORMAL HIGH (ref 70–99)
Potassium: 3.4 mmol/L — ABNORMAL LOW (ref 3.5–5.1)
Sodium: 138 mmol/L (ref 135–145)

## 2019-07-30 LAB — CBC WITH DIFFERENTIAL/PLATELET
Abs Immature Granulocytes: 0.08 10*3/uL — ABNORMAL HIGH (ref 0.00–0.07)
Basophils Absolute: 0.1 10*3/uL (ref 0.0–0.1)
Basophils Relative: 1 %
Eosinophils Absolute: 0.9 10*3/uL — ABNORMAL HIGH (ref 0.0–0.5)
Eosinophils Relative: 7 %
HCT: 36.1 % (ref 36.0–46.0)
Hemoglobin: 12.8 g/dL (ref 12.0–15.0)
Immature Granulocytes: 1 %
Lymphocytes Relative: 17 %
Lymphs Abs: 2.3 10*3/uL (ref 0.7–4.0)
MCH: 27.4 pg (ref 26.0–34.0)
MCHC: 35.5 g/dL (ref 30.0–36.0)
MCV: 77.3 fL — ABNORMAL LOW (ref 80.0–100.0)
Monocytes Absolute: 0.7 10*3/uL (ref 0.1–1.0)
Monocytes Relative: 6 %
Neutro Abs: 9.1 10*3/uL — ABNORMAL HIGH (ref 1.7–7.7)
Neutrophils Relative %: 68 %
Platelets: 214 10*3/uL (ref 150–400)
RBC: 4.67 MIL/uL (ref 3.87–5.11)
RDW: 15.7 % — ABNORMAL HIGH (ref 11.5–15.5)
WBC: 13.2 10*3/uL — ABNORMAL HIGH (ref 4.0–10.5)
nRBC: 0 % (ref 0.0–0.2)

## 2019-07-30 LAB — URINE CULTURE
Culture: 20000 — AB
Special Requests: NORMAL

## 2019-07-30 LAB — GLUCOSE, CAPILLARY
Glucose-Capillary: 123 mg/dL — ABNORMAL HIGH (ref 70–99)
Glucose-Capillary: 158 mg/dL — ABNORMAL HIGH (ref 70–99)
Glucose-Capillary: 136 mg/dL — ABNORMAL HIGH (ref 70–99)
Glucose-Capillary: 151 mg/dL — ABNORMAL HIGH (ref 70–99)

## 2019-07-30 LAB — LIPASE, BLOOD: Lipase: 119 U/L — ABNORMAL HIGH (ref 11–51)

## 2019-07-30 NOTE — Progress Notes (Signed)
PROGRESS NOTE    Jocelyn Sanchez  [id number] DOB: 10/16/1955 DOA: 07/26/2019 PCP: Dixie Dials, MD    Brief Narrative:  64 y.o.femalewith medical history significant for chronicanemia, type 2 diabetes mellitus, gait disorder, hypertension, GI bleed, gastric and duodenal angiodysplasia, lymphedema, multiple sclerosis, history of pulmonary embolism on Eliquis, who presented to the emergency department with complaints of constipation associated with abdominal pain. She had not had bowel movement for 48 hours and was feeling uncomfortable.  Also has complaint of decreased urinary output over the last 24 hours.   ED Course:K+ 6.6 sodium 126 white count 12.0 hemoglobin 7.7 and lactic acid was however normal, she was also found to be dehydrated. Blood cultures were obtained.  Elevated creatinine with acute kidney injury in addition to the hyperkalemia. EDMD consulted with nephrology who advised to start her on bicarb infusion and hydration.  Hospital course complicated by diarrhea,  C. difficile antigen positive.  Toxin negative.  PCR pending.  Drop in hemoglobin with positive FOBT. Hg down to 6.5.  Post 2U Prbcs increased to 12.0  Assessment & Plan:   Principal Problem:   Septic shock (Jocelyn Sanchez) Active Problems:   Multiple sclerosis (HCC)   Diabetes mellitus with peripheral vascular disease (Jocelyn Sanchez)   Leukocytosis   Sepsis (Jocelyn Sanchez)   Resolved severe sepsis likely multifactorial secondary to presumed UTI with suspected intra-abdominal infection Presented with leukocytosis, tachycardia, diffuse abdominal pain, hypotension, elevated lactic acid 3.6. Sepsis physiology improved. CT abdomen and pelvis without contrast showed suspected acute pancreatitis.  Large stool burden in the rectum with circumferential rectal wall thickening muscular hypertrophy possibly indicating mild stercoral colitis, suspected left pyelonephritis. UA positive, urine culture was reincubated for better growth. MRSA  negative Blood cultures negative to date. Initially started on IV Zosyn and IV vancomycin empirically, later switched to cefepime and IV Flagyl on 07/28/2019 Anticipate completing 5 days total of IV antibiotics. Repeat CBC in AM  Possible mild stercoral colitis on CT Hx of chronic constipation Seen on CT abd pelvis without contrast [facility] [place] had been following Received water enema, now with diarrhea  Hx of C-diff diarrhea Stool sample Cdiff antigen positive, PCR remains pending Continued with diarrhea Continue florastor Stool culture is pending  Refractory hypokalemia, multifactorial secondary to bicarb infusion and diarrhea.  improved Repeat bmet in AM  Severe hypomagnesemia Possibly from GI losses with diarrhea Replaced. Would repeat level in AM  Improving acute pancreatitis, unclear etiology No gallstone, no alcohol use, no medication that may have caused Mildly elevated triglyceride, likely not the cause Elevated lipase >170>> 2700>> 650>> 120 Started on soft diet today.  Due to nausea will de-escalate down to full liquid diet.  Continue supportive care.  Continue gentle IV fluid hydration.  Continue to closely monitor electrolytes and volume status in the setting of chronic heart failure with reduced EF.  No evidence of gallstone or biliary duct dilatation on CT Slight inflammation adjacent to the proximal body of the pancreas and adjacent to the uncinate process of the pancreas on CT abdomen and pelvis No evidence of pancreatic mass  Presumed UTI with suspected left pyelonephritis Positive UA for pyuria Urine culture reintubated for better growth.,  Continue to follow cultures. Continue empiric antibiotic.  Will complete 5 days per above Blood cultures noted to be negative to date.  Chronic constipation now with possible stercoral colitis  Stercoral colitis management per GI.  Resolved AKI likely prerenal in the setting of poor oral intake and  dehydration Hypovolemic on exam Presented with creatinine  of 4.65 Creatinine back to baseline of 0.6 with GFR greater than 60. Continue to avoid nephrotoxins, dehydration and hypotension Monitor urine output Daily BMPs  Multiple bilateral renal calculi including partial staghorn calculi on CT abdomen and pelvis Monitor urine output and symptomatology  Resolved anion gap metabolic acidosis likely secondary to acute renal failure Presented with serum bicarb of 10 and anion gap of 14>> serum bicarb 26 and anion gap of 13. Stopped bicarb drip on 07/28/2019.  Acute blood loss, unclear etiology Positive FOBT with no overt signs of bleeding Iron studies indicative of iron deficiency.  Continue ferrous sulfate 325 mg daily. Hemoglobin dropped to 6.6, this was confirmed with repeated H&H. Post 2 unit PRBC transfusion hemoglobin 12 Repeat CBC in AM.    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  Chronic systolic CHF with LVEF [id number] Last 2D echo done on 08/09/2017 showed LVEF 40-45% with diffuse hypokinesis Closely monitor volume status while on IV fluid to avoid complications such as pulmonary edema or bilateral pleural effusions Continue Strict I's and O's and daily weight  History of PE Stable Continue Eliquis as tolerated  Type 2 diabetes A1c 6.7 on 07/27/19 Continue insulin sliding scale  History of MS No acute issues at this time  DVT prophylaxis: eliquis Code Status: Full Family Communication: Pt in room, family not at bedside Disposition Plan: Home with [place].  D/c when diarrhea improves and pt tolerates PO   Consultants:   Nephrology  GI  Procedures:     Antimicrobials: Anti-infectives (From admission, onward)   Start     Dose/Rate Route Frequency Ordered Stop   07/29/19 2200  ceFEPIme  (MAXIPIME) 2 g in sodium chloride 0.9 % 100 mL IVPB     2 g 200 mL/hr over 30 Minutes Intravenous Every 12 hours 07/29/19 1441 07/30/19 1108   07/29/19 2200  metroNIDAZOLE (FLAGYL) IVPB 500 mg     500 mg 100 mL/hr over 60 Minutes Intravenous Every 8 hours 07/29/19 1441 07/30/19 2159   07/29/19 1000  ceFEPIme (MAXIPIME) 2 g in sodium chloride 0.9 % 100 mL IVPB  Status:  Discontinued     2 g 200 mL/hr over 30 Minutes Intravenous Every 12 hours 07/29/19 0740 07/29/19 1441   07/29/19 0830  ceFEPIme (MAXIPIME) 2 g in sodium chloride 0.9 % 100 mL IVPB  Status:  Discontinued     2 g 200 mL/hr over 30 Minutes Intravenous Every 8 hours 07/29/19 0726 07/29/19 0740   07/28/19 1400  metroNIDAZOLE (FLAGYL) IVPB 500 mg  Status:  Discontinued     500 mg 100 mL/hr over 60 Minutes Intravenous Every 8 hours 07/28/19 1145 07/29/19 1441   07/28/19 1300  ceFEPIme (MAXIPIME) 2 g in sodium chloride 0.9 % 100 mL IVPB  Status:  Discontinued     2 g 200 mL/hr over 30 Minutes Intravenous Every 12 hours 07/28/19 1145 07/29/19 0726   07/28/19 0900  vancomycin (VANCOCIN) IVPB 1000 mg/200 mL premix  Status:  Discontinued     1,000 mg 200 mL/hr over 60 Minutes Intravenous Every 24 hours 07/28/19 0817 07/29/19 0736   07/28/19 0800  vancomycin (VANCOCIN) IVPB 1000 mg/200 mL premix  Status:  Discontinued     1,000 mg 200 mL/hr over 60 Minutes Intravenous  Once 07/28/19 0615 07/28/19 0803   07/27/19 1200  piperacillin-tazobactam (ZOSYN) IVPB 3.375 g  Status:  Discontinued  3.375 g 12.5 mL/hr over 240 Minutes Intravenous Every 8 hours 07/27/19 0914 07/28/19 1143   07/27/19 0910  vancomycin variable dose per unstable renal function (pharmacist dosing)  Status:  Discontinued      Does not apply See admin instructions 07/27/19 0911 07/28/19 0818   07/27/19 0000  piperacillin-tazobactam (ZOSYN) IVPB 2.25 g  Status:  Discontinued     2.25 g 100 mL/hr over 30 Minutes Intravenous Every 6 hours 07/26/19 2322 07/27/19 0914    07/26/19 2330  vancomycin (VANCOREADY) IVPB 500 mg/100 mL     500 mg 100 mL/hr over 60 Minutes Intravenous  Once 07/26/19 2218 07/27/19 0729   07/26/19 2230  piperacillin-tazobactam (ZOSYN) IVPB 3.375 g  Status:  Discontinued     3.375 g 12.5 mL/hr over 240 Minutes Intravenous Every 8 hours 07/26/19 2218 07/26/19 2322   07/26/19 1430  vancomycin (VANCOCIN) IVPB 1000 mg/200 mL premix     1,000 mg 200 mL/hr over 60 Minutes Intravenous  Once 07/26/19 1415 07/26/19 1640   07/26/19 1415  piperacillin-tazobactam (ZOSYN) IVPB 3.375 g     3.375 g 100 mL/hr over 30 Minutes Intravenous  Once 07/26/19 1406 07/26/19 1458       Subjective: Complaining of throwing up this AM, still having diarrhea  Objective: Vitals:   07/29/19 2000 07/30/19 0425 07/30/19 0428 07/30/19 1244  BP: 124/75 133/82  (!) 143/90  Pulse: 87 86  96  Resp: 20 16  16   Temp: 98.2 F (36.8 C) 98.4 F (36.9 C)  98.5 F (36.9 C)  TempSrc: Oral Oral  Oral  SpO2: 100% 100%  100%  Weight:   63.1 kg     Intake/Output Summary (Last 24 hours) at 07/30/2019 1517 Last data filed at 07/30/2019 0900 Gross per 24 hour  Intake 1358.66 ml  Output 650 ml  Net 708.66 ml   Filed Weights   07/26/19 2300 07/29/19 0650 07/30/19 0428  Weight: 66.4 kg 61.5 kg 63.1 kg    Examination:  General exam: Appears calm and comfortable  Respiratory system: Clear to auscultation. Respiratory effort normal. Cardiovascular system: S1 & S2 heard, Regular Gastrointestinal system: Abdomen is nondistended, soft and nontender. No organomegaly or masses felt. Normal bowel sounds heard. Central nervous system: Alert and oriented. No focal neurological deficits. Extremities: Symmetric 5 x 5 power. Skin: No rashes, lesions  Psychiatry: Judgement and insight appear normal. Mood & affect appropriate.   Data Reviewed: I have personally reviewed following labs and imaging studies  CBC: Recent Labs  Lab 07/26/19 1306 07/26/19 1306 07/27/19 0710  07/27/19 0710 07/27/19 0753 07/27/19 1904 07/28/19 0526 07/29/19 0522 07/30/19 0409  WBC 12.0*  --  9.7  --   --   --  11.4* 13.6* 13.2*  NEUTROABS 9.7*  --   --   --   --   --  8.8* 8.8* 9.1*  HGB 7.7*   < > 6.5*   < > 6.6* 12.9 12.0 11.0* 12.8  HCT 24.7*   < > 20.2*   < > 20.3* 37.5 35.6* 32.9* 36.1  MCV 81.3  --  78.0*  --   --   --  79.5* 82.0 77.3*  PLT 545*  --  451*  --   --   --  419* 385 214   < > = values in this interval not displayed.   Basic Metabolic Panel: Recent Labs  Lab 07/26/19 1306 07/26/19 1306 07/27/19 0710 07/28/19 0526 07/28/19 1950 07/29/19 0522 07/29/19 0552 07/30/19 0735  NA  126*  --  135 142  --  143  --  138  K 6.6*   < > 3.5 2.2* 2.9* 2.2*  --  3.4*  CL 102  --  107 103  --  109  --  111  CO2 10*  --  13* 26  --  24  --  18*  GLUCOSE 134*  --  357* 272*  --  105*  --  136*  BUN 100*  --  104* 60*  --  27*  --  15  CREATININE 4.65*  --  2.67* 1.11*  --  0.62  --  0.50  CALCIUM 8.7*  --  7.8* 7.0*  --  6.2*  --  6.8*  MG  --   --   --   --   --   --  0.6*  --    < > = values in this interval not displayed.   GFR: Estimated Creatinine Clearance: 63.6 mL/min (by C-G formula based on SCr of 0.5 mg/dL). Liver Function Tests: Recent Labs  Lab 07/26/19 1306 07/27/19 0710  AST 10* 13*  ALT 9 7  ALKPHOS 90 63  BILITOT 1.2 0.7  PROT 8.1 6.3*  ALBUMIN 3.3* 2.6*   Recent Labs  Lab 07/26/19 1306 07/27/19 0753 07/28/19 0526 07/29/19 0522 07/30/19 0735  LIPASE 152* 2,747* 650* 120* 119*   No results for input(s): AMMONIA in the last 168 hours. Coagulation Profile: Recent Labs  Lab 07/27/19 0710  INR 1.8*   Cardiac Enzymes: No results for input(s): CKTOTAL, CKMB, CKMBINDEX, TROPONINI in the last 168 hours. BNP (last 3 results) No results for input(s): PROBNP in the last 8760 hours. HbA1C: No results for input(s): HGBA1C in the last 72 hours. CBG: Recent Labs  Lab 07/29/19 1140 07/29/19 1605 07/29/19 2117 07/30/19 0736  07/30/19 1155  GLUCAP 150* 132* 147* 123* 158*   Lipid Profile: Recent Labs    07/28/19 1950  TRIG 167*   Thyroid Function Tests: No results for input(s): TSH, T4TOTAL, FREET4, T3FREE, THYROIDAB in the last 72 hours. Anemia Panel: No results for input(s): VITAMINB12, FOLATE, FERRITIN, TIBC, IRON, RETICCTPCT in the last 72 hours. Sepsis Labs: Recent Labs  Lab 07/26/19 1306 07/27/19 0710  PROCALCITON  --  0.21  LATICACIDVEN 0.8 3.6*    Recent Results (from the past 240 hour(s))  Culture, blood (routine x 2)     Status: None (Preliminary result)   Collection Time: 07/26/19  1:00 PM   Specimen: BLOOD RIGHT FOREARM  Result Value Ref Range Status   Specimen Description   Final    BLOOD RIGHT FOREARM Performed at [facility] [address], [place], [place] [address]    Special Requests   Final    BAA Blood Culture adequate volume Performed at [facility] [address], [place], [place] [address]    Culture   Final    NO GROWTH 4 DAYS Performed at [facility], [place] [address], [place],  [address]    Report Status PENDING  Incomplete  Respiratory Panel by RT PCR (Flu A&B, Covid) - Nasopharyngeal Swab     Status: None   Collection Time: 07/26/19  7:38 PM   Specimen: Nasopharyngeal Swab  Result Value Ref Range Status   SARS Coronavirus 2 by RT PCR NEGATIVE NEGATIVE Final    Comment: (NOTE) SARS-CoV-2 target nucleic acids are NOT DETECTED. The SARS-CoV-2 RNA is generally detectable in upper respiratoy specimens during the acute  phase of infection. The lowest concentration of SARS-CoV-2 viral copies this assay can detect is 131 copies/mL. A negative result does not preclude SARS-Cov-2 infection and should not be used as the sole basis for treatment or other patient management decisions. A negative result may occur with  improper specimen collection/handling, submission of specimen other than nasopharyngeal swab,  presence of viral mutation(s) within the areas targeted by this assay, and inadequate number of viral copies (<131 copies/mL). A negative result must be combined with clinical observations, patient history, and epidemiological information. The expected result is Negative. Fact Sheet for Patients:  PinkCheek.be Fact Sheet for Healthcare Providers:  GravelBags.it This test is not yet ap proved or cleared by the [place] FDA and  has been authorized for detection and/or diagnosis of SARS-CoV-2 by FDA under an Emergency Use Authorization (EUA). This EUA will remain  in effect (meaning this test can be used) for the duration of the COVID-19 declaration under Section 564(b)(1) of the Act, 21 U.S.C. section 360bbb-3(b)(1), unless the authorization is terminated or revoked sooner.    Influenza A by PCR NEGATIVE NEGATIVE Final   Influenza B by PCR NEGATIVE NEGATIVE Final    Comment: (NOTE) The Xpert Xpress SARS-CoV-2/FLU/RSV assay is intended as an aid in  the diagnosis of influenza from Nasopharyngeal swab specimens and  should not be used as a sole basis for treatment. Nasal washings and  aspirates are unacceptable for Xpert Xpress SARS-CoV-2/FLU/RSV  testing. Fact Sheet for Patients: PinkCheek.be Fact Sheet for Healthcare Providers: GravelBags.it This test is not yet approved or cleared by the [place] FDA and  has been authorized for detection and/or diagnosis of SARS-CoV-2 by  FDA under an Emergency Use Authorization (EUA). This EUA will remain  in effect (meaning this test can be used) for the duration of the  Covid-19 declaration under Section 564(b)(1) of the Act, 21  U.S.C. section 360bbb-3(b)(1), unless the authorization is  terminated or revoked. Performed at [facility], [place] [address] [place], [place] [address]   Culture, blood  (routine x 2)     Status: None (Preliminary result)   Collection Time: 07/27/19  7:10 AM   Specimen: BLOOD  Result Value Ref Range Status   Specimen Description   Final    BLOOD RIGHT HAND Performed at [facility] [address], [place], [place] [address]    Special Requests   Final    BOTTLES DRAWN AEROBIC AND ANAEROBIC Blood Culture adequate volume Performed at [facility] [address], [place], [place] [address]    Culture   Final    NO GROWTH 3 DAYS Performed at [facility], [place] [address] [place], [place] [address]    Report Status PENDING  Incomplete  C difficile quick scan w PCR reflex     Status: Abnormal   Collection Time: 07/27/19  7:52 AM   Specimen: STOOL  Result Value Ref Range Status   C Diff antigen POSITIVE (A) NEGATIVE Final   C Diff toxin NEGATIVE NEGATIVE Final   C Diff interpretation Results are indeterminate. See PCR results.  Final    Comment: Performed at [facility], [place] [address] [place], [place] [address]  Culture, Urine     Status: Abnormal   Collection Time: 07/28/19 11:32 AM   Specimen: Urine, Clean Catch  Result Value Ref Range Status   Specimen Description   Final    URINE, CLEAN CATCH Performed at [facility], [place] [address], [place], [place] [address]    Special Requests   Final    Normal Performed at [facility], [place] [address], [place], [place] [address]    Culture 20,000 COLONIES/mL PSEUDOMONAS AERUGINOSA (A)  Final   Report Status 07/30/2019 FINAL  Final   Organism ID, Bacteria PSEUDOMONAS AERUGINOSA (A)  Final      Susceptibility   Pseudomonas aeruginosa - MIC*    CEFTAZIDIME 4 SENSITIVE Sensitive     CIPROFLOXACIN 0.5 SENSITIVE Sensitive     GENTAMICIN <=1 SENSITIVE Sensitive     IMIPENEM 0.5 SENSITIVE Sensitive     PIP/TAZO 8 SENSITIVE Sensitive     CEFEPIME 2 SENSITIVE Sensitive     * 20,000 COLONIES/mL PSEUDOMONAS  AERUGINOSA  MRSA PCR Screening     Status: None   Collection Time: 07/28/19  4:53 PM   Specimen: Urine, Clean Catch; Nasopharyngeal  Result Value Ref Range Status   MRSA by PCR NEGATIVE NEGATIVE Final    Comment:        The GeneXpert MRSA Assay (FDA approved for NASAL specimens only), is one component of a comprehensive MRSA colonization surveillance program. It is not intended to diagnose MRSA infection nor to guide or monitor treatment for MRSA infections. Performed at [facility], [place] [address], [place], [place] [address]      Radiology Studies: No results found.  Scheduled Meds: . apixaban  5 mg Oral BID  . famotidine  10 mg Oral Daily  . feeding supplement (ENSURE ENLIVE)  237 mL Oral BID BM  . feeding supplement (PRO-STAT SUGAR FREE 64)  30 mL Oral BID  . ferrous sulfate  325 mg Oral Q breakfast  . insulin aspart  0-9 Units Subcutaneous TID WC  . multivitamin with minerals  1 tablet Oral Daily  . saccharomyces boulardii  250 mg Oral BID   Continuous Infusions: . sodium chloride 1,000 mL (07/29/19 2150)  . lactated ringers with kcl 50 mL/hr at 07/30/19 [id number]  . metronidazole 500 mg (07/30/19 0641)     LOS: 4 days   Marylu Lund, MD [facility] Pager On Amion  If 7PM-7AM, please contact night-coverage 07/30/2019, 3:17 PM

## 2019-07-30 NOTE — Progress Notes (Signed)
OT Cancellation Note  Patient Details Name: Jocelyn Sanchez MRN: [id number] DOB: 07-Jul-1955   Cancelled Treatment:    Reason Eval/Treat Not Completed: OT screened, no needs identified, will sign off. Noted pt is total A at baseline. Will sign off.  Abegail Kloeppel 07/30/2019, 7:55 AM  Karsten Ro, OTR/L Acute Rehabilitation Services 07/30/2019

## 2019-07-30 NOTE — Progress Notes (Addendum)
During am medication administration patient complained of nausea. Pt was unable to take medications by mouth. Shortly afterwards pt experienced an episode of emesis 100 cc. Pt is now NPO. Pt continues to have several loose stools. Provider updated.

## 2019-07-31 DIAGNOSIS — G35 Multiple sclerosis: Secondary | ICD-10-CM

## 2019-07-31 LAB — CBC WITH DIFFERENTIAL/PLATELET
Abs Immature Granulocytes: 0.09 10*3/uL — ABNORMAL HIGH (ref 0.00–0.07)
Basophils Absolute: 0.1 10*3/uL (ref 0.0–0.1)
Basophils Relative: 1 %
Eosinophils Absolute: 1.1 10*3/uL — ABNORMAL HIGH (ref 0.0–0.5)
Eosinophils Relative: 8 %
HCT: 35.7 % — ABNORMAL LOW (ref 36.0–46.0)
Hemoglobin: 12.1 g/dL (ref 12.0–15.0)
Immature Granulocytes: 1 %
Lymphocytes Relative: 22 %
Lymphs Abs: 3.1 10*3/uL (ref 0.7–4.0)
MCH: 27.3 pg (ref 26.0–34.0)
MCHC: 33.9 g/dL (ref 30.0–36.0)
MCV: 80.4 fL (ref 80.0–100.0)
Monocytes Absolute: 0.8 10*3/uL (ref 0.1–1.0)
Monocytes Relative: 6 %
Neutro Abs: 8.9 10*3/uL — ABNORMAL HIGH (ref 1.7–7.7)
Neutrophils Relative %: 62 %
Platelets: 454 10*3/uL — ABNORMAL HIGH (ref 150–400)
RBC: 4.44 MIL/uL (ref 3.87–5.11)
RDW: 16.4 % — ABNORMAL HIGH (ref 11.5–15.5)
WBC: 14.2 10*3/uL — ABNORMAL HIGH (ref 4.0–10.5)
nRBC: 0 % (ref 0.0–0.2)

## 2019-07-31 LAB — BASIC METABOLIC PANEL
Anion gap: 12 (ref 5–15)
BUN: 11 mg/dL (ref 8–23)
CO2: 17 mmol/L — ABNORMAL LOW (ref 22–32)
Calcium: 7 mg/dL — ABNORMAL LOW (ref 8.9–10.3)
Chloride: 111 mmol/L (ref 98–111)
Creatinine, Ser: 0.52 mg/dL (ref 0.44–1.00)
GFR calc Af Amer: >60 mL/min (ref >60)
GFR calc non Af Amer: >60 mL/min (ref >60)
Glucose, Bld: 124 mg/dL — ABNORMAL HIGH (ref 70–99)
Potassium: 6 mmol/L — ABNORMAL HIGH (ref 3.5–5.1)
Sodium: 140 mmol/L (ref 135–145)

## 2019-07-31 LAB — CULTURE, BLOOD (ROUTINE X 2)
Culture: NO GROWTH
Special Requests: ADEQUATE

## 2019-07-31 LAB — MAGNESIUM: Magnesium: 1.3 mg/dL — ABNORMAL LOW (ref 1.7–2.4)

## 2019-07-31 LAB — GLUCOSE, CAPILLARY
Glucose-Capillary: 119 mg/dL — ABNORMAL HIGH (ref 70–99)
Glucose-Capillary: 128 mg/dL — ABNORMAL HIGH (ref 70–99)
Glucose-Capillary: 124 mg/dL — ABNORMAL HIGH (ref 70–99)
Glucose-Capillary: 182 mg/dL — ABNORMAL HIGH (ref 70–99)

## 2019-07-31 LAB — LIPASE, BLOOD: Lipase: 88 U/L — ABNORMAL HIGH (ref 11–51)

## 2019-07-31 MED ORDER — LACTATED RINGERS IV SOLN: INTRAVENOUS | Status: DC

## 2019-07-31 MED ORDER — PANTOPRAZOLE SODIUM 40 MG PO TBEC
40.0000 mg | DELAYED_RELEASE_TABLET | Freq: Every day | ORAL | Status: DC
  Administered 2019-07-31 – 2019-08-03 (×4): 40 mg via ORAL
  Filled 2019-07-31 (×4): qty 1

## 2019-07-31 MED ORDER — MAGNESIUM SULFATE 2 GM/50ML IV SOLN
2.0000 g | Freq: Once | INTRAVENOUS | Status: AC
  Administered 2019-07-31: 2 g via INTRAVENOUS
  Filled 2019-07-31: qty 50

## 2019-07-31 MED ORDER — LIDOCAINE-PRILOCAINE 2.5-2.5 % EX CREA
TOPICAL_CREAM | CUTANEOUS | Status: DC | PRN
  Administered 2019-08-02: 1 via TOPICAL
  Filled 2019-07-31 (×3): qty 5

## 2019-07-31 NOTE — Progress Notes (Signed)
PROGRESS NOTE    Jocelyn Sanchez  [id number] DOB: Nov 08, 1955 DOA: 07/26/2019 PCP: Dixie Dials, MD    Brief Narrative:  64 y.o.femalewith medical history significant for chronicanemia, type 2 diabetes mellitus, gait disorder, hypertension, GI bleed, gastric and duodenal angiodysplasia, lymphedema, multiple sclerosis, history of pulmonary embolism on Eliquis, who presented to the emergency department with complaints of constipation associated with abdominal pain. She had not had bowel movement for 48 hours and was feeling uncomfortable.  Also has complaint of decreased urinary output over the last 24 hours.   ED Course:K+ 6.6 sodium 126 white count 12.0 hemoglobin 7.7 and lactic acid was however normal, she was also found to be dehydrated. Blood cultures were obtained.  Elevated creatinine with acute kidney injury in addition to the hyperkalemia. EDMD consulted with nephrology who advised to start her on bicarb infusion and hydration.  Hospital course complicated by diarrhea,  C. difficile antigen positive.  Toxin negative.  PCR pending.  Drop in hemoglobin with positive FOBT. Hg down to 6.5.  Post 2U Prbcs increased to 12.0  Assessment & Plan:   Principal Problem:   Septic shock ([place]) Active Problems:   Multiple sclerosis (H[facility])   Diabetes mellitus with peripheral vascular disease ([place])   Leukocytosis   Sepsis ([place])   Resolved severe sepsis likely multifactorial secondary to presumed UTI with suspected intra-abdominal infection Presented with leukocytosis, tachycardia, diffuse abdominal pain, hypotension, elevated lactic acid 3.6. Sepsis physiology improved. CT abdomen and pelvis without contrast showed suspected acute pancreatitis.  Large stool burden in the rectum with circumferential rectal wall thickening muscular hypertrophy possibly indicating mild stercoral colitis, suspected left pyelonephritis. UA positive, urine culture was reincubated for better growth. MRSA  negative Blood cultures negative to date. Initially started on IV Zosyn and IV vancomycin empirically, later switched to cefepime and IV Flagyl on 07/28/2019 Plan on completing 5 days total of IV antibiotics. Repeat CBC in AM  Possible mild stercoral colitis on CT Hx of chronic constipation Seen on CT abd pelvis without contrast GI  had been following Received water enema, now with diarrhea, rectal tube in place with dark liquid stools noted  Hx of C-diff diarrhea Stool sample Cdiff antigen positive, PCR still pending Continued with diarrhea Continue florastor Stool culture is pending  Refractory hypokalemia, multifactorial secondary to bicarb infusion and diarrhea.  improved Recheck bmet in AM  Severe hypomagnesemia Possibly from GI losses with diarrhea Remains low, Will replace  Improving acute pancreatitis, unclear etiology No gallstone, no alcohol use, no medication that may have caused Mildly elevated triglyceride, likely not the cause Elevated lipase >170>> 2700>> 650>> 120 Started on soft diet today.  Due to nausea will de-escalate down to full liquid diet.  Continue supportive care.  Continue gentle IV fluid hydration.  Continue to closely monitor electrolytes and volume status in the setting of chronic heart failure with reduced EF.  No evidence of gallstone or biliary duct dilatation on CT Slight inflammation adjacent to the proximal body of the pancreas and adjacent to the uncinate process of the pancreas on CT abdomen and pelvis No evidence of pancreatic mass  Presumed UTI with suspected left pyelonephritis Positive UA for pyuria Urine culture reintubated for better growth.,  Continue to follow cultures. Continue empiric antibiotic.  Will complete 5 days per above Blood cultures noted to be negative at this time  Chronic constipation now with possible stercoral colitis  Stercoral colitis management per GI.  Resolved AKI likely prerenal in the  setting of poor oral  intake and dehydration Hypovolemic on exam Presented with creatinine of 4.65 Creatinine back to baseline of 0.6 with GFR greater than 60. Continue to avoid nephrotoxins, dehydration and hypotension Monitor urine output Repeat bmet in AM  Multiple bilateral renal calculi including partial staghorn calculi on CT abdomen and pelvis Monitor urine output and symptomatology  Resolved anion gap metabolic acidosis likely secondary to acute renal failure Presented with serum bicarb of 10 and anion gap of 14>> serum bicarb 26 and anion gap of 13. Stopped bicarb drip on 07/28/2019.  Acute blood loss, unclear etiology Positive FOBT with no overt signs of bleeding Iron studies indicative of iron deficiency.  Continue ferrous sulfate 325 mg daily. Hemoglobin dropped to 6.6, this was confirmed with repeated H&H. Post 2 unit PRBC transfusion hemoglobin 12 Recheck CBC in AM.    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  Chronic systolic CHF with LVEF [id number] Last 2D echo done on 08/09/2017 showed LVEF 40-45% with diffuse hypokinesis Closely monitor volume status while on IV fluid to avoid complications such as pulmonary edema or bilateral pleural effusions Continue Strict I's and O's and daily weight  History of PE Stable Continue Eliquis as tolerated  Type 2 diabetes A1c 6.7 on 07/27/19 Continue insulin sliding scale  History of MS No acute issues at this time  DVT prophylaxis: eliquis Code Status: Full Family Communication: Pt in room, family not at bedside Disposition Plan: Home with Walker.  D/c when diarrhea improves and pt tolerates PO   Consultants:   Nephrology  GI  Procedures:     Antimicrobials: Anti-infectives (From admission, onward)   Start     Dose/Rate Route Frequency Ordered  Stop   07/29/19 2200  ceFEPIme (MAXIPIME) 2 g in sodium chloride 0.9 % 100 mL IVPB     2 g 200 mL/hr over 30 Minutes Intravenous Every 12 hours 07/29/19 1441 07/30/19 1108   07/29/19 2200  metroNIDAZOLE (FLAGYL) IVPB 500 mg     500 mg 100 mL/hr over 60 Minutes Intravenous Every 8 hours 07/29/19 1441 07/30/19 1600   07/29/19 1000  ceFEPIme (MAXIPIME) 2 g in sodium chloride 0.9 % 100 mL IVPB  Status:  Discontinued     2 g 200 mL/hr over 30 Minutes Intravenous Every 12 hours 07/29/19 0740 07/29/19 1441   07/29/19 0830  ceFEPIme (MAXIPIME) 2 g in sodium chloride 0.9 % 100 mL IVPB  Status:  Discontinued     2 g 200 mL/hr over 30 Minutes Intravenous Every 8 hours 07/29/19 0726 07/29/19 0740   07/28/19 1400  metroNIDAZOLE (FLAGYL) IVPB 500 mg  Status:  Discontinued     500 mg 100 mL/hr over 60 Minutes Intravenous Every 8 hours 07/28/19 1145 07/29/19 1441   07/28/19 1300  ceFEPIme (MAXIPIME) 2 g in sodium chloride 0.9 % 100 mL IVPB  Status:  Discontinued     2 g 200 mL/hr over 30 Minutes Intravenous Every 12 hours 07/28/19 1145 07/29/19 0726   07/28/19 0900  vancomycin (VANCOCIN) IVPB 1000 mg/200 mL premix  Status:  Discontinued     1,000 mg 200 mL/hr over 60 Minutes Intravenous Every 24 hours 07/28/19 0817 07/29/19 0736   07/28/19 0800  vancomycin (VANCOCIN) IVPB 1000 mg/200 mL premix  Status:  Discontinued     1,000 mg 200 mL/hr over 60 Minutes Intravenous  Once 07/28/19 0615 07/28/19 0803   07/27/19 1200  piperacillin-tazobactam (ZOSYN) IVPB 3.375 g  Status:  Discontinued     3.375 g 12.5 mL/hr over 240 Minutes Intravenous Every 8 hours 07/27/19 0914 07/28/19 1143   07/27/19 0910  vancomycin variable dose per unstable renal function (pharmacist dosing)  Status:  Discontinued      Does not apply See admin instructions 07/27/19 0911 07/28/19 0818   07/27/19 0000  piperacillin-tazobactam (ZOSYN) IVPB 2.25 g  Status:  Discontinued     2.25 g 100 mL/hr over 30 Minutes Intravenous Every 6 hours  07/26/19 2322 07/27/19 0914   07/26/19 2330  vancomycin (VANCOREADY) IVPB 500 mg/100 mL     500 mg 100 mL/hr over 60 Minutes Intravenous  Once 07/26/19 2218 07/27/19 0729   07/26/19 2230  piperacillin-tazobactam (ZOSYN) IVPB 3.375 g  Status:  Discontinued     3.375 g 12.5 mL/hr over 240 Minutes Intravenous Every 8 hours 07/26/19 2218 07/26/19 2322   07/26/19 1430  vancomycin (VANCOCIN) IVPB 1000 mg/200 mL premix     1,000 mg 200 mL/hr over 60 Minutes Intravenous  Once 07/26/19 1415 07/26/19 1640   07/26/19 1415  piperacillin-tazobactam (ZOSYN) IVPB 3.375 g     3.375 g 100 mL/hr over 30 Minutes Intravenous  Once 07/26/19 1406 07/26/19 1458      Subjective: Reports feeling better today, still asking for more liquid based diet  Objective: Vitals:   07/30/19 1244 07/30/19 2115 07/31/19 0431 07/31/19 0500  BP: (!) 143/90 (!) 145/85 (!) 138/93   Pulse: 96 92 (!) 108   Resp: 16 18 18    Temp: 98.5 F (36.9 C) 98.1 F (36.7 C) 97.7 F (36.5 C)   TempSrc: Oral     SpO2: 100% 100% 100%   Weight:    63.9 kg    Intake/Output Summary (Last 24 hours) at 07/31/2019 1604 Last data filed at 07/31/2019 [id number] Gross per 24 hour  Intake 1687.43 ml  Output --  Net 1687.43 ml   Filed Weights   07/29/19 0650 07/30/19 0428 07/31/19 0500  Weight: 61.5 kg 63.1 kg 63.9 kg    Examination: General exam: Awake, laying in bed, in nad Respiratory system: Normal respiratory effort, no wheezing Cardiovascular system: regular rate, s1, s2 Gastrointestinal system: Soft, nondistended, positive BS Central nervous system: CN2-12 grossly intact, strength intact Extremities: Perfused, no clubbing Skin: Normal skin turgor, no notable skin lesions seen Psychiatry: Mood normal // no visual hallucinations   Data Reviewed: I have personally reviewed following labs and imaging studies  CBC: Recent Labs  Lab 07/26/19 1306 07/26/19 1306 07/27/19 0710 07/27/19 0753 07/27/19 1904 07/28/19 0526  07/29/19 0522 07/30/19 0409 07/31/19 0518  WBC 12.0*   < > 9.7  --   --  11.4* 13.6* 13.2* 14.2*  NEUTROABS 9.7*  --   --   --   --  8.8* 8.8* 9.1* 8.9*  HGB 7.7*   < > 6.5*   < > 12.9 12.0 11.0* 12.8 12.1  HCT 24.7*   < > 20.2*   < > 37.5 35.6* 32.9* 36.1 35.7*  MCV 81.3   < > 78.0*  --   --  79.5* 82.0 77.3* 80.4  PLT 545*   < > 451*  --   --  419* 385 214 454*   < > = values in this interval not displayed.   Basic Metabolic Panel: Recent Labs  Lab 07/27/19 0710 07/27/19 0710 07/28/19 0526 07/28/19 1950 07/29/19 0522 07/29/19 0552 07/30/19 0735 07/31/19 0518 07/31/19 0800  NA 135  --  142  --  143  --  138 140  --   K 3.5   < > 2.2* 2.9* 2.2*  --  3.4* 6.0*  --   CL 107  --  103  --  109  --  111 111  --   CO2 13*  --  26  --  24  --  18* 17*  --   GLUCOSE 357*  --  272*  --  105*  --  136* 124*  --   BUN 104*  --  60*  --  27*  --  15 11  --   CREATININE 2.67*  --  1.11*  --  0.62  --  0.50 0.52  --   CALCIUM 7.8*  --  7.0*  --  6.2*  --  6.8* 7.0*  --   MG  --   --   --   --   --  0.6*  --   --  1.3*   < > = values in this interval not displayed.   GFR: Estimated Creatinine Clearance: 63.9 mL/min (by C-G formula based on SCr of 0.52 mg/dL). Liver Function Tests: Recent Labs  Lab 07/26/19 1306 07/27/19 0710  AST 10* 13*  ALT 9 7  ALKPHOS 90 63  BILITOT 1.2 0.7  PROT 8.1 6.3*  ALBUMIN 3.3* 2.6*   Recent Labs  Lab 07/27/19 0753 07/28/19 0526 07/29/19 0522 07/30/19 0735 07/31/19 0518  LIPASE 2,747* 650* 120* 119* 88*   No results for input(s): AMMONIA in the last 168 hours. Coagulation Profile: Recent Labs  Lab 07/27/19 0710  INR 1.8*   Cardiac Enzymes: No results for input(s): CKTOTAL, CKMB, CKMBINDEX, TROPONINI in the last 168 hours. BNP (last 3 results) No results for input(s): PROBNP in the last 8760 hours. HbA1C: No results for input(s): HGBA1C in the last 72 hours. CBG: Recent Labs  Lab 07/30/19 1155 07/30/19 1654 07/30/19 2117  07/31/19 0756 07/31/19 1211  GLUCAP 158* 136* 151* 119* 128*   Lipid Profile: Recent Labs    07/28/19 1950  TRIG 167*   Thyroid Function Tests: No results for input(s): TSH, T4TOTAL, FREET4, T3FREE, THYROIDAB in the last 72 hours. Anemia Panel: No results for input(s): VITAMINB12, FOLATE, FERRITIN, TIBC, IRON, RETICCTPCT in the last 72 hours. Sepsis Labs: Recent Labs  Lab 07/26/19 1306 07/27/19 0710  PROCALCITON  --  0.21  LATICACIDVEN 0.8 3.6*    Recent Results (from the past 240 hour(s))  Culture, blood (routine x 2)     Status: None   Collection Time: 07/26/19  1:00 PM   Specimen: BLOOD RIGHT FOREARM  Result Value Ref Range Status   Specimen Description   Final    BLOOD RIGHT FOREARM Performed at [facility] [address] [place], [place] [address]    Special Requests   Final    BAA Blood Culture adequate volume Performed at [facility] [address] [place], [place] [address]    Culture   Final    NO GROWTH 5 DAYS Performed at [facility], [place] [address] [place], [place] [address]    Report Status 07/31/2019 FINAL  Final  Respiratory Panel by RT PCR (Flu A&B, Covid) - Nasopharyngeal Swab     Status: None   Collection Time: 07/26/19  7:38 PM   Specimen: Nasopharyngeal Swab  Result Value Ref Range Status   SARS Coronavirus 2 by RT PCR NEGATIVE NEGATIVE Final    Comment: (NOTE)  SARS-CoV-2 target nucleic acids are NOT DETECTED. The SARS-CoV-2 RNA is generally detectable in upper respiratoy specimens during the acute phase of infection. The lowest concentration of SARS-CoV-2 viral copies this assay can detect is 131 copies/mL. A negative result does not preclude SARS-Cov-2 infection and should not be used as the sole basis for treatment or other patient management decisions. A negative result may occur with  improper specimen collection/handling, submission of specimen other than nasopharyngeal swab, presence  of viral mutation(s) within the areas targeted by this assay, and inadequate number of viral copies (<131 copies/mL). A negative result must be combined with clinical observations, patient history, and epidemiological information. The expected result is Negative. Fact Sheet for Patients:  PinkCheek.be Fact Sheet for Healthcare Providers:  GravelBags.it This test is not yet ap proved or cleared by the [place] FDA and  has been authorized for detection and/or diagnosis of SARS-CoV-2 by FDA under an Emergency Use Authorization (EUA). This EUA will remain  in effect (meaning this test can be used) for the duration of the COVID-19 declaration under Section 564(b)(1) of the Act, 21 U.S.C. section 360bbb-3(b)(1), unless the authorization is terminated or revoked sooner.    Influenza A by PCR NEGATIVE NEGATIVE Final   Influenza B by PCR NEGATIVE NEGATIVE Final    Comment: (NOTE) The Xpert Xpress SARS-CoV-2/FLU/RSV assay is intended as an aid in  the diagnosis of influenza from Nasopharyngeal swab specimens and  should not be used as a sole basis for treatment. Nasal washings and  aspirates are unacceptable for Xpert Xpress SARS-CoV-2/FLU/RSV  testing. Fact Sheet for Patients: PinkCheek.be Fact Sheet for Healthcare Providers: GravelBags.it This test is not yet approved or cleared by the [place] FDA and  has been authorized for detection and/or diagnosis of SARS-CoV-2 by  FDA under an Emergency Use Authorization (EUA). This EUA will remain  in effect (meaning this test can be used) for the duration of the  Covid-19 declaration under Section 564(b)(1) of the Act, 21  U.S.C. section 360bbb-3(b)(1), unless the authorization is  terminated or revoked. Performed at [facility] [place], [place] [address], [place], [place] [address]   Culture, blood (routine x  2)     Status: None (Preliminary result)   Collection Time: 07/27/19  7:10 AM   Specimen: BLOOD  Result Value Ref Range Status   Specimen Description   Final    BLOOD RIGHT HAND Performed at [facility] [address], [place], [place] [address]    Special Requests   Final    BOTTLES DRAWN AEROBIC AND ANAEROBIC Blood Culture adequate volume Performed at [facility] [address] [place], [place] [address]    Culture   Final    NO GROWTH 4 DAYS Performed at [facility], [place] [address], [place], [place] [address]    Report Status PENDING  Incomplete  C difficile quick scan w PCR reflex     Status: Abnormal   Collection Time: 07/27/19  7:52 AM   Specimen: STOOL  Result Value Ref Range Status   C Diff antigen POSITIVE (A) NEGATIVE Final   C Diff toxin NEGATIVE NEGATIVE Final   C Diff interpretation Results are indeterminate. See PCR results.  Final    Comment: Performed at [facility], [place] [address] [place],  [address]  Culture, Urine     Status: Abnormal   Collection Time: 07/28/19 11:32 AM   Specimen: Urine, Clean Catch  Result Value Ref  Range Status   Specimen Description   Final    URINE, CLEAN CATCH Performed at [facility], [place] [address], [place], [place] [address]    Special Requests   Final    Normal Performed at [facility], [place] [address], [place], [place] [address]    Culture 20,000 COLONIES/mL PSEUDOMONAS AERUGINOSA (A)  Final   Report Status 07/30/2019 FINAL  Final   Organism ID, Bacteria PSEUDOMONAS AERUGINOSA (A)  Final      Susceptibility   Pseudomonas aeruginosa - MIC*    CEFTAZIDIME 4 SENSITIVE Sensitive     CIPROFLOXACIN 0.5 SENSITIVE Sensitive     GENTAMICIN <=1 SENSITIVE Sensitive     IMIPENEM 0.5 SENSITIVE Sensitive     PIP/TAZO 8 SENSITIVE Sensitive     CEFEPIME 2 SENSITIVE Sensitive     * 20,000 COLONIES/mL PSEUDOMONAS AERUGINOSA   MRSA PCR Screening     Status: None   Collection Time: 07/28/19  4:53 PM   Specimen: Urine, Clean Catch; Nasopharyngeal  Result Value Ref Range Status   MRSA by PCR NEGATIVE NEGATIVE Final    Comment:        The GeneXpert MRSA Assay (FDA approved for NASAL specimens only), is one component of a comprehensive MRSA colonization surveillance program. It is not intended to diagnose MRSA infection nor to guide or monitor treatment for MRSA infections. Performed at [facility], [place] [address], [place], [place] [address]      Radiology Studies: No results found.  Scheduled Meds: . apixaban  5 mg Oral BID  . feeding supplement (ENSURE ENLIVE)  237 mL Oral BID BM  . feeding supplement (PRO-STAT SUGAR FREE 64)  30 mL Oral BID  . ferrous sulfate  325 mg Oral Q breakfast  . insulin aspart  0-9 Units Subcutaneous TID WC  . multivitamin with minerals  1 tablet Oral Daily  . pantoprazole  40 mg Oral Daily  . saccharomyces boulardii  250 mg Oral BID   Continuous Infusions: . sodium chloride 240 mL (07/31/19 0420)  . lactated ringers 50 mL/hr at 07/31/19 0913     LOS: 5 days   Marylu Lund, MD [facility] Pager On Amion  If 7PM-7AM, please contact night-coverage 07/31/2019, 4:04 PM

## 2019-07-31 NOTE — Progress Notes (Signed)
   07/31/19 1300  Clinical Encounter Type  Visited With Patient not available  Visit Type Initial;Psychological support;Spiritual support  Referral From Nurse  Consult/Referral To Chaplain  Spiritual Encounters  Spiritual Needs Prayer;Emotional;Other (Comment)  Stress Factors  Patient Stress Factors Not reviewed   I attempted to contact the patient per spiritual care consult. The patient was unavailable at that time. Spiritual Care will follow up.   Chaplain Shanon Ace M.Div., [facility]

## 2019-07-31 NOTE — Progress Notes (Signed)
  Speech Language Pathology Treatment: Dysphagia  Patient Details Name: Jocelyn Sanchez MRN: [id number] DOB: 05-17-1956 Today's Date: 07/31/2019 Time: [id number] SLP Time Calculation (min) (ACUTE ONLY): 15 min  Assessment / Plan / Recommendation Clinical Impression  Initiation of RMST addressed.  [employer] PEP set at 6 cmH20 pressue clinically based on pt's performance.  Pt benefited from max cues initially to perform (SlP holding device) fading to min for adequacy of performance by end of the session.  She was advised to perform this 20 repetitions, 3x's a day for 16 weeks to functionally see improvement.  She reports this will give her encouragement to have a time fram.  Recommend SLP follow up to maximize her respiratory strength.    In addition, discussed pt's tolerance of her lunch meal - she advised she did not gag with her soup.  Also informed her that the GI Md who scoped her in 2018 advised she be on a PPI indefinitely - and she had been on an H2Blocker.  Hopeful this change in her medications may help to diminish her gag response as she denies gagging at home with meal (only large pills).    Will follow up to assess her tolerance of po, reinforce RMST use and for education. Using teach back and written instructions, education ongoing and pt is making progress with possible differential sources of her gagging.    HPI HPI: 64 yo female adm to [facility] with septic shock and she was found tohave pancreatitis.  Also has h/o stercoral collitis, left pylonephrits, h/o Cdif, UTI, AKI, renal calculi, gait d/o, increased urine, constipation and abdominal pain.  Swallow eval ordered - RN reports pt is gagging with solids- pt denies dysphagia - states she only is gagging with foods she doesn't like.  She has undergone two EGDs      SLP Plan  Continue with current plan of care       Recommendations  Diet recommendations: (full liquid) Liquids provided via: Straw Medication  Administration: (as tolerated, if large with icecream) Supervision: Full supervision/cueing for compensatory strategies Compensations: Slow rate;Small sips/bites Postural Changes and/or Swallow Maneuvers: Upright 30-60 min after meal(at least up to 63* with po)                Oral Care Recommendations: Oral care BID Follow up Recommendations: Home health SLP SLP Visit Diagnosis: Dysphagia, unspecified (R13.10) Plan: Continue with current plan of care       GO                Macario Golds 07/31/2019, 6:08 PM   [name], MS [place] Office [phone]

## 2019-07-31 NOTE — Evaluation (Signed)
Clinical/Bedside Swallow Evaluation Patient Details  Name: Jocelyn Sanchez MRN: [id number] Date of Birth: [birth year]  Today's Date: 07/31/2019 Time: SLP Start Time (ACUTE ONLY): 1215 SLP Stop Time (ACUTE ONLY): 1241 SLP Time Calculation (min) (ACUTE ONLY): 26 min  Past Medical History:  Past Medical History:  Diagnosis Date  . Anemia   . DM II (diabetes mellitus, type II), controlled ([place])   . Gait disorder   . HTN (hypertension)   . Lymphedema   . MS (multiple sclerosis) ([place])   . Pulmonary embolism [facility])    Past Surgical History:  Past Surgical History:  Procedure Laterality Date  . ESOPHAGOGASTRODUODENOSCOPY N/A 07/12/2016   Procedure: ESOPHAGOGASTRODUODENOSCOPY (EGD);  Surgeon: Irene Shipper, MD;  Location: [facility];  Service: Endoscopy;  Laterality: N/A;  . ESOPHAGOGASTRODUODENOSCOPY N/A 02/11/2019   Procedure: ESOPHAGOGASTRODUODENOSCOPY (EGD);  Surgeon: Carol Ada, MD;  Location: [facility];  Service: Endoscopy;  Laterality: N/A;  . FLEXIBLE SIGMOIDOSCOPY Left 03/09/2015   Procedure: FLEXIBLE SIGMOIDOSCOPY;  Surgeon: Carol Ada, MD;  Location: [facility]OSCOPY;  Service: Endoscopy;  Laterality: Left;  . HOT HEMOSTASIS N/A 02/11/2019   Procedure: HOT HEMOSTASIS (ARGON PLASMA COAGULATION/BICAP);  Surgeon: Carol Ada, MD;  Location: [facility];  Service: Endoscopy;  Laterality: N/A;   HPI:  64 yo female adm to [facility] with septic shock and she was found tohave pancreatitis.  Also has h/o stercoral collitis, left pylonephrits, h/o Cdif, UTI, AKI, renal calculi, gait d/o, increased urine, constipation and abdominal pain.  Swallow eval ordered - RN reports pt is gagging with solids- pt denies dysphagia - states she only is gagging with foods she doesn't like.  She has undergone two EGDs   Assessment / Plan / Recommendation Clinical Impression  Pt with no focal CN deficits and functional oropharyngeal swallow clinically observed with liquids and puree. She did present with  gagging with mastication attempts of graham cracker and states this only occurs with foods she does not like.  Inquiring re: premorbid dysphagia, pt denies difficulties stating "I don't have problems swallowing".  SLP suspects her GI issues are contributing to her gagging. She admits to only gagging with large pills at home prior to admission.  Upon review of prior endoscopies - last being 07/12/2016, MD advised she take a PPI daily indefinitely as she has h/o esophagitis, stenosis, angiodysplasia of the stomach and dudodenum without bleeding hemotemesis.  Note she is on an H2 Blocker at this time (and prior to admit) but not a PPI.     Advised hospitalist of Dr Blanch Media recommendation for PPI.  Pt states "I don't want to chew anything" indicating she wants a full liquid diet.  Given pt's cough is weak due to her MS, recommend RMST to strengthen her cough/voice - thus her airway protection.   SLP advised she have her head up to at least 63* with po and not further down than 43* after meals given her GI hx.  SLP will follow up for dysphagia management. Thanks for this order. SLP Visit Diagnosis: Dysphagia, unspecified (R13.10)    Aspiration Risk  Mild aspiration risk    Diet Recommendation Thin liquid;Nectar-thick liquid;Other (Comment)(full liquids)   Liquid Administration via: Straw Medication Administration: (as tolerated, if large with icecream) Compensations: Slow rate;Small sips/bites Postural Changes: Seated upright at 90 degrees;Remain upright for at least 30 minutes after po intake    Other  Recommendations Oral Care Recommendations: Oral care BID   Follow up Recommendations Home health SLP      Frequency and Duration  min 1 x/week  1 week       Prognosis Prognosis for Safe Diet Advancement: Good      Swallow Study   General Date of Onset: 07/31/19 HPI: 63 yo female adm to [facility] with septic shock and she was found tohave pancreatitis.  Also has h/o stercoral collitis, left  pylonephrits, h/o Cdif, UTI, AKI, renal calculi, gait d/o, increased urine, constipation and abdominal pain.  Swallow eval ordered - RN reports pt is gagging with solids- pt denies dysphagia - states she only is gagging with foods she doesn't like.  She has undergone two EGDs Type of Study: Bedside Swallow Evaluation Previous Swallow Assessment: none in epic Diet Prior to this Study: Thin liquids(full liquids) Temperature Spikes Noted: No Respiratory Status: Room air History of Recent Intubation: No Behavior/Cognition: Alert;Cooperative;Pleasant mood Oral Cavity Assessment: Within Functional Limits Oral Care Completed by SLP: No Oral Cavity - Dentition: Adequate natural dentition;Dentures, top Vision: Functional for self-feeding Self-Feeding Abilities: Total assist Patient Positioning: Upright in bed Baseline Vocal Quality: Low vocal intensity Volitional Cough: Weak Volitional Swallow: Unable to elicit    Oral/Motor/Sensory Function Overall Oral Motor/Sensory Function: Generalized oral weakness   Ice Chips Ice chips: Not tested   Thin Liquid Thin Liquid: Within functional limits Presentation: Straw    Nectar Thick Nectar Thick Liquid: Not tested   Honey Thick Honey Thick Liquid: Not tested   Puree Puree: Within functional limits Presentation: Spoon   Solid     Solid: Impaired Oral Phase Impairments: Reduced lingual movement/coordination;Impaired mastication Other Comments: gagging with mastication attempts - pt states she only gags with foods she does not like; she also stated she would drink liquids prior eating solids - advised her she had liquids prior to cracker intake      Macario Golds 07/31/2019,1:18 PM  [name], MS [place] Office [phone]

## 2019-08-01 DIAGNOSIS — E1151 Type 2 diabetes mellitus with diabetic peripheral angiopathy without gangrene: Secondary | ICD-10-CM

## 2019-08-01 LAB — GLUCOSE, CAPILLARY
Glucose-Capillary: 116 mg/dL — ABNORMAL HIGH (ref 70–99)
Glucose-Capillary: 206 mg/dL — ABNORMAL HIGH (ref 70–99)
Glucose-Capillary: 161 mg/dL — ABNORMAL HIGH (ref 70–99)
Glucose-Capillary: 130 mg/dL — ABNORMAL HIGH (ref 70–99)

## 2019-08-01 LAB — COMPREHENSIVE METABOLIC PANEL
ALT: 9 U/L (ref 0–44)
AST: 12 U/L — ABNORMAL LOW (ref 15–41)
Albumin: 2.1 g/dL — ABNORMAL LOW (ref 3.5–5.0)
Alkaline Phosphatase: 65 U/L (ref 38–126)
Anion gap: 8 (ref 5–15)
BUN: 8 mg/dL (ref 8–23)
CO2: 18 mmol/L — ABNORMAL LOW (ref 22–32)
Calcium: 7.3 mg/dL — ABNORMAL LOW (ref 8.9–10.3)
Chloride: 114 mmol/L — ABNORMAL HIGH (ref 98–111)
Creatinine, Ser: 0.43 mg/dL — ABNORMAL LOW (ref 0.44–1.00)
GFR calc Af Amer: >60 mL/min (ref >60)
GFR calc non Af Amer: >60 mL/min (ref >60)
Glucose, Bld: 134 mg/dL — ABNORMAL HIGH (ref 70–99)
Potassium: 3 mmol/L — ABNORMAL LOW (ref 3.5–5.1)
Sodium: 140 mmol/L (ref 135–145)
Total Bilirubin: 0.6 mg/dL (ref 0.3–1.2)
Total Protein: 5.7 g/dL — ABNORMAL LOW (ref 6.5–8.1)

## 2019-08-01 LAB — CULTURE, BLOOD (ROUTINE X 2)
Culture: NO GROWTH
Special Requests: ADEQUATE

## 2019-08-01 LAB — MAGNESIUM: Magnesium: 1.3 mg/dL — ABNORMAL LOW (ref 1.7–2.4)

## 2019-08-01 MED ORDER — MAGNESIUM SULFATE 4 GM/100ML IV SOLN
4.0000 g | Freq: Once | INTRAVENOUS | Status: AC
  Administered 2019-08-01: 4 g via INTRAVENOUS
  Filled 2019-08-01: qty 100

## 2019-08-01 MED ORDER — POTASSIUM CHLORIDE 20 MEQ/15ML (10%) PO SOLN
40.0000 meq | Freq: Two times a day (BID) | ORAL | Status: AC
  Administered 2019-08-01: 40 meq via ORAL
  Filled 2019-08-01 (×2): qty 30

## 2019-08-01 MED ORDER — MAGNESIUM SULFATE 2 GM/50ML IV SOLN: 2.0000 g | Freq: Once | INTRAVENOUS | Status: DC

## 2019-08-01 NOTE — Progress Notes (Signed)
SLP Cancellation Note  Patient Details Name: Jocelyn Sanchez MRN: [id number] DOB: May 09, 1956   Cancelled treatment:       Reason Eval/Treat Not Completed: Other (comment). Checking with RN over the phone. Pt is tolerating sips of full liquids though intake is minimal as pt dislikes the taste of most PO. Asked RN to reinforce ose of RMST device. Will f/u next week.    Zoei Amison, Katherene Ponto 08/01/2019, 10:00 AM

## 2019-08-01 NOTE — Care Management Important Message (Signed)
Important Message  Patient Details IM Letter given to Marlou Starks SW Case Manager to present to the [name] MRN: [id number] Date of Birth: 1955/07/02   Medicare Important Message Given:  Yes     Kerin Salen 08/01/2019, 12:53 PM

## 2019-08-01 NOTE — Progress Notes (Signed)
Jocelyn Sanchez  [id number] DOB: 1955-09-28 DOA: 07/26/2019 PCP: Dixie Dials, MD    Brief Narrative:  64 y.o.femalewith medical history significant for chronicanemia, type 2 diabetes mellitus, gait disorder, hypertension, GI bleed, gastric and duodenal angiodysplasia, lymphedema, multiple sclerosis, history of pulmonary embolism on Eliquis, who presented to the emergency department with complaints of constipation associated with abdominal pain. She had not had bowel movement for 48 hours and was feeling uncomfortable.  Also has complaint of decreased urinary output over the last 24 hours.   ED Course:K+ 6.6 sodium 126 white count 12.0 hemoglobin 7.7 and lactic acid was however normal, she was also found to be dehydrated. Blood cultures were obtained.  Elevated creatinine with acute kidney injury in addition to the hyperkalemia. EDMD consulted with nephrology who advised to start her on bicarb infusion and hydration.  Hospital course complicated by diarrhea,  C. difficile antigen positive.  Toxin negative.  PCR pending.  Drop in hemoglobin with positive FOBT. Hg down to 6.5.  Post 2U Prbcs increased to 12.0  Assessment & Plan:   Principal Problem:   Septic shock ([place]) Active Problems:   Multiple sclerosis (H[facility])   Diabetes mellitus with peripheral vascular disease ([place])   Leukocytosis   Sepsis ([place])   Resolved severe sepsis likely multifactorial secondary to presumed UTI with suspected intra-abdominal infection Presented with leukocytosis, tachycardia, diffuse abdominal pain, hypotension, elevated lactic acid 3.6. Sepsis physiology improved. CT abdomen and pelvis without contrast showed suspected acute pancreatitis.  Large stool burden in the rectum with circumferential rectal wall thickening muscular hypertrophy possibly indicating mild stercoral colitis, suspected left pyelonephritis. UA positive, urine culture was reincubated for better growth. MRSA  negative Blood cultures negative to date. Initially started on IV Zosyn and IV vancomycin empirically, later switched to cefepime and IV Flagyl on 07/28/2019 Completed 5 days total of IV antibiotics.  Possible mild stercoral colitis on CT Hx of chronic constipation Reviewed on CT abd pelvis without contrast GI [place] had been following Received water enema, now with diarrhea, rectal tube in place with dark liquid stools noted. Stool output seems to be slowing, however pt had required rectal tube given quantity of loose stool output  Hx of C-diff diarrhea Stool sample Cdiff antigen positive, PCR remains pending Continued with diarrhea, albeit improved Continue florastor Stool culture is pending  Refractory hypokalemia, multifactorial secondary to bicarb infusion and diarrhea.  Very labile electrolytes likely as a result of on-going diarrhea. Potassium was 6 yesterday, 3.0 today, needing replacement Mg reviewed, low at 1.3, will replace Given labile electrolytes, will continue to correct and will repeat lytes in AM  Severe hypomagnesemia Possibly from GI losses with diarrhea Remains low, replace today  Improving acute pancreatitis, unclear etiology No gallstone, no alcohol use, no medication that may have caused Mildly elevated triglyceride, likely not the cause Elevated lipase >170>> 2700>> 650>> 120 Started on soft diet today.  Due to nausea will de-escalate down to full liquid diet.  Continue supportive care.  Continue gentle IV fluid hydration.  Continue to closely monitor electrolytes and volume status in the setting of chronic heart failure with reduced EF.  No evidence of gallstone or biliary duct dilatation on CT Slight inflammation adjacent to the proximal body of the pancreas and adjacent to the uncinate process of the pancreas on CT abdomen and pelvis No evidence of pancreatic mass noted  Presumed UTI with suspected left pyelonephritis Positive UA for pyuria Urine  culture reintubated for better growth.,  Continue to follow cultures. Continue empiric antibiotic.  Will complete 5 days per above Blood cultures noted to be negative at this time  Chronic constipation now with possible stercoral colitis  Stercoral colitis management per GI.  Resolved AKI likely prerenal in the setting of poor oral intake and dehydration Hypovolemic on exam Presented with creatinine of 4.65 Creatinine back to baseline of 0.6 with GFR greater than 60. Continue to avoid nephrotoxins, dehydration and hypotension Monitor urine output Recheck bmet in AM  Multiple bilateral renal calculi including partial staghorn calculi on CT abdomen and pelvis Monitor urine output and symptomatology  Resolved anion gap metabolic acidosis likely secondary to acute renal failure Presented with serum bicarb of 10 and anion gap of 14>> serum bicarb 26 and anion gap of 13. Stopped bicarb drip on 07/28/2019.  Acute blood loss, unclear etiology Positive FOBT with no overt signs of bleeding Iron studies indicative of iron deficiency.  Continue ferrous sulfate 325 mg daily. Hemoglobin dropped to 6.6, this was confirmed with repeated H&H. 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  Chronic systolic CHF with LVEF [id number] Last 2D echo done on 08/09/2017 showed LVEF 40-45% with diffuse hypokinesis Closely monitor volume status while on IV fluid to avoid complications such as pulmonary edema or bilateral pleural effusions Continue Strict I's and O's and daily weight  History of PE Stable Continue Eliquis as tolerated  Type 2 diabetes A1c 6.7 on 07/27/19 Continue insulin sliding scale as needed  History of MS No acute issues at this time  DVT  prophylaxis: eliquis Code Status: Full Family Communication: Pt in room, family not at bedside Disposition Plan: [facility] with [facility] when diarrhea further improves and electrolytes remain stable  Consultants:   Nephrology  GI  Procedures:     Antimicrobials: Anti-infectives (From admission, onward)   Start     Dose/Rate Route Frequency Ordered Stop   07/29/19 2200  ceFEPIme (MAXIPIME) 2 g in sodium chloride 0.9 % 100 mL IVPB     2 g 200 mL/hr over 30 Minutes Intravenous Every 12 hours 07/29/19 1441 07/30/19 1108   07/29/19 2200  metroNIDAZOLE (FLAGYL) IVPB 500 mg     500 mg 100 mL/hr over 60 Minutes Intravenous Every 8 hours 07/29/19 1441 07/30/19 1600   07/29/19 1000  ceFEPIme (MAXIPIME) 2 g in sodium chloride 0.9 % 100 mL IVPB  Status:  Discontinued     2 g 200 mL/hr over 30 Minutes Intravenous Every 12 hours 07/29/19 0740 07/29/19 1441   07/29/19 0830  ceFEPIme (MAXIPIME) 2 g in sodium chloride 0.9 % 100 mL IVPB  Status:  Discontinued     2 g 200 mL/hr over 30 Minutes Intravenous Every 8 hours 07/29/19 0726 07/29/19 0740   07/28/19 1400  metroNIDAZOLE (FLAGYL) IVPB 500 mg  Status:  Discontinued     500 mg 100 mL/hr over 60 Minutes Intravenous Every 8 hours 07/28/19 1145 07/29/19 1441   07/28/19 1300  ceFEPIme (MAXIPIME) 2 g in sodium chloride 0.9 % 100 mL IVPB  Status:  Discontinued     2 g 200 mL/hr over 30 Minutes Intravenous Every 12 hours 07/28/19 1145 07/29/19 0726   07/28/19 0900  vancomycin (VANCOCIN) IVPB 1000 mg/200 mL premix  Status:  Discontinued     1,000 mg 200 mL/hr over  60 Minutes Intravenous Every 24 hours 07/28/19 0817 07/29/19 0736   07/28/19 0800  vancomycin (VANCOCIN) IVPB 1000 mg/200 mL premix  Status:  Discontinued     1,000 mg 200 mL/hr over 60 Minutes Intravenous  Once 07/28/19 0615 07/28/19 0803   07/27/19 1200  piperacillin-tazobactam (ZOSYN) IVPB 3.375 g  Status:  Discontinued     3.375 g 12.5 mL/hr over 240 Minutes Intravenous Every 8 hours 07/27/19  0914 07/28/19 1143   07/27/19 0910  vancomycin variable dose per unstable renal function (pharmacist dosing)  Status:  Discontinued      Does not apply See admin instructions 07/27/19 0911 07/28/19 0818   07/27/19 0000  piperacillin-tazobactam (ZOSYN) IVPB 2.25 g  Status:  Discontinued     2.25 g 100 mL/hr over 30 Minutes Intravenous Every 6 hours 07/26/19 2322 07/27/19 0914   07/26/19 2330  vancomycin (VANCOREADY) IVPB 500 mg/100 mL     500 mg 100 mL/hr over 60 Minutes Intravenous  Once 07/26/19 2218 07/27/19 0729   07/26/19 2230  piperacillin-tazobactam (ZOSYN) IVPB 3.375 g  Status:  Discontinued     3.375 g 12.5 mL/hr over 240 Minutes Intravenous Every 8 hours 07/26/19 2218 07/26/19 2322   07/26/19 1430  vancomycin (VANCOCIN) IVPB 1000 mg/200 mL premix     1,000 mg 200 mL/hr over 60 Minutes Intravenous  Once 07/26/19 1415 07/26/19 1640   07/26/19 1415  piperacillin-tazobactam (ZOSYN) IVPB 3.375 g     3.375 g 100 mL/hr over 30 Minutes Intravenous  Once 07/26/19 1406 07/26/19 1458      Subjective: Denies abd pain or nausea this AM. Still tolerating liquid diet  Objective: Vitals:   07/31/19 1214 07/31/19 2041 08/01/19 0443 08/01/19 1324  BP: (!) 145/89 139/79 (!) 141/80 (!) 154/93  Pulse: (!) 104 97 99 100  Resp: 20 18 17 18   Temp: 97.6 F (36.4 C) 98.2 F (36.8 C) 98.3 F (36.8 C) 98.3 F (36.8 C)  TempSrc:    Oral  SpO2: 100% 100% 100% 100%  Weight:        Intake/Output Summary (Last 24 hours) at 08/01/2019 1451 Last data filed at 08/01/2019 0600 Gross per 24 hour  Intake 2098.78 ml  Output --  Net 2098.78 ml   Filed Weights   07/29/19 0650 07/30/19 0428 07/31/19 0500  Weight: 61.5 kg 63.1 kg 63.9 kg    Examination: General exam: Conversant, in no acute distress Respiratory system: normal chest rise, clear, no audible wheezing Cardiovascular system: regular rhythm, s1-s2 Gastrointestinal system: Nondistended, nontender, pos BS, rectal tube in place Central  nervous system: No seizures, no tremors Extremities: No cyanosis, no joint deformities Skin: No rashes, no pallor Psychiatry: Affect normal // no auditory hallucinations   Data Reviewed: I have personally reviewed following labs and imaging studies  CBC: Recent Labs  Lab 07/26/19 1306 07/26/19 1306 07/27/19 0710 07/27/19 0753 07/27/19 1904 07/28/19 0526 07/29/19 0522 07/30/19 0409 07/31/19 0518  WBC 12.0*   < > 9.7  --   --  11.4* 13.6* 13.2* 14.2*  NEUTROABS 9.7*  --   --   --   --  8.8* 8.8* 9.1* 8.9*  HGB 7.7*   < > 6.5*   < > 12.9 12.0 11.0* 12.8 12.1  HCT 24.7*   < > 20.2*   < > 37.5 35.6* 32.9* 36.1 35.7*  MCV 81.3   < > 78.0*  --   --  79.5* 82.0 77.3* 80.4  PLT 545*   < > 451*  --   --  419* 385 214 454*   < > = values in this interval not displayed.   Basic Metabolic Panel: Recent Labs  Lab 07/28/19 0526 07/28/19 0526 07/28/19 1950 07/29/19 0522 07/29/19 0552 07/30/19 0735 07/31/19 0518 07/31/19 0800 08/01/19 0531 08/01/19 0745  NA 142  --   --  143  --  138 140  --  140  --   K 2.2*   < > 2.9* 2.2*  --  3.4* 6.0*  --  3.0*  --   CL 103  --   --  109  --  111 111  --  114*  --   CO2 26  --   --  24  --  18* 17*  --  18*  --   GLUCOSE 272*  --   --  105*  --  136* 124*  --  134*  --   BUN 60*  --   --  27*  --  15 11  --  8  --   CREATININE 1.11*  --   --  0.62  --  0.50 0.52  --  0.43*  --   CALCIUM 7.0*  --   --  6.2*  --  6.8* 7.0*  --  7.3*  --   MG  --   --   --   --  0.6*  --   --  1.3*  --  1.3*   < > = values in this interval not displayed.   GFR: Estimated Creatinine Clearance: 63.9 mL/min (A) (by C-G formula based on SCr of 0.43 mg/dL (L)). Liver Function Tests: Recent Labs  Lab 07/26/19 1306 07/27/19 0710 08/01/19 0531  AST 10* 13* 12*  ALT 9 7 9   ALKPHOS 90 63 65  BILITOT 1.2 0.7 0.6  PROT 8.1 6.3* 5.7*  ALBUMIN 3.3* 2.6* 2.1*   Recent Labs  Lab 07/27/19 0753 07/28/19 0526 07/29/19 0522 07/30/19 0735 07/31/19 0518  LIPASE  2,747* 650* 120* 119* 88*   No results for input(s): AMMONIA in the last 168 hours. Coagulation Profile: Recent Labs  Lab 07/27/19 0710  INR 1.8*   Cardiac Enzymes: No results for input(s): CKTOTAL, CKMB, CKMBINDEX, TROPONINI in the last 168 hours. BNP (last 3 results) No results for input(s): PROBNP in the last 8760 hours. HbA1C: No results for input(s): HGBA1C in the last 72 hours. CBG: Recent Labs  Lab 07/31/19 1211 07/31/19 1838 07/31/19 2039 08/01/19 0749 08/01/19 1200  GLUCAP 128* 124* 182* 116* 206*   Lipid Profile: No results for input(s): CHOL, HDL, LDLCALC, TRIG, CHOLHDL, LDLDIRECT in the last 72 hours. Thyroid Function Tests: No results for input(s): TSH, T4TOTAL, FREET4, T3FREE, THYROIDAB in the last 72 hours. Anemia Panel: No results for input(s): VITAMINB12, FOLATE, FERRITIN, TIBC, IRON, RETICCTPCT in the last 72 hours. Sepsis Labs: Recent Labs  Lab 07/26/19 1306 07/27/19 0710  PROCALCITON  --  0.21  LATICACIDVEN 0.8 3.6*    Recent Results (from the past 240 hour(s))  Culture, blood (routine x 2)     Status: None   Collection Time: 07/26/19  1:00 PM   Specimen: BLOOD RIGHT FOREARM  Result Value Ref Range Status   Specimen Description   Final    BLOOD RIGHT FOREARM Performed at [facility] [address], [place], [place] [address]    Special Requests   Final    BAA Blood Culture adequate volume Performed at [facility] [address], [place], [place] [address]  Culture   Final    NO GROWTH 5 DAYS Performed at [facility], [place] [address], [place], [place] [address]    Report Status 07/31/2019 FINAL  Final  Respiratory Panel by RT PCR (Flu A&B, Covid) - Nasopharyngeal Swab     Status: None   Collection Time: 07/26/19  7:38 PM   Specimen: Nasopharyngeal Swab  Result Value Ref Range Status   SARS Coronavirus 2 by RT PCR NEGATIVE NEGATIVE Final    Comment: (NOTE) SARS-CoV-2 target nucleic  acids are NOT DETECTED. The SARS-CoV-2 RNA is generally detectable in upper respiratoy specimens during the acute phase of infection. The lowest concentration of SARS-CoV-2 viral copies this assay can detect is 131 copies/mL. A negative result does not preclude SARS-Cov-2 infection and should not be used as the sole basis for treatment or other patient management decisions. A negative result may occur with  improper specimen collection/handling, submission of specimen other than nasopharyngeal swab, presence of viral mutation(s) within the areas targeted by this assay, and inadequate number of viral copies (<131 copies/mL). A negative result must be combined with clinical observations, patient history, and epidemiological information. The expected result is Negative. Fact Sheet for Patients:  PinkCheek.be Fact Sheet for Healthcare Providers:  GravelBags.it This test is not yet ap proved or cleared by the [place] FDA and  has been authorized for detection and/or diagnosis of SARS-CoV-2 by FDA under an Emergency Use Authorization (EUA). This EUA will remain  in effect (meaning this test can be used) for the duration of the COVID-19 declaration under Section 564(b)(1) of the Act, 21 U.S.C. section 360bbb-3(b)(1), unless the authorization is terminated or revoked sooner.    Influenza A by PCR NEGATIVE NEGATIVE Final   Influenza B by PCR NEGATIVE NEGATIVE Final    Comment: (NOTE) The Xpert Xpress SARS-CoV-2/FLU/RSV assay is intended as an aid in  the diagnosis of influenza from Nasopharyngeal swab specimens and  should not be used as a sole basis for treatment. Nasal washings and  aspirates are unacceptable for Xpert Xpress SARS-CoV-2/FLU/RSV  testing. Fact Sheet for Patients: PinkCheek.be Fact Sheet for Healthcare Providers: GravelBags.it This test is not yet  approved or cleared by the [place] FDA and  has been authorized for detection and/or diagnosis of SARS-CoV-2 by  FDA under an Emergency Use Authorization (EUA). This EUA will remain  in effect (meaning this test can be used) for the duration of the  Covid-19 declaration under Section 564(b)(1) of the Act, 21  U.S.C. section 360bbb-3(b)(1), unless the authorization is  terminated or revoked. Performed at [facility], [place] [address], [place], [place] [address]   Culture, blood (routine x 2)     Status: None   Collection Time: 07/27/19  7:10 AM   Specimen: BLOOD  Result Value Ref Range Status   Specimen Description   Final    BLOOD RIGHT HAND Performed at [facility] [address], [place], [place] [address]    Special Requests   Final    BOTTLES DRAWN AEROBIC AND ANAEROBIC Blood Culture adequate volume Performed at [facility] [address] [place], [place] [address]    Culture   Final    NO GROWTH 5 DAYS Performed at [facility], [place] [address] [place], [place] [address]    Report Status 08/01/2019 FINAL  Final  C difficile quick scan w PCR reflex     Status: Abnormal   Collection Time: 07/27/19  7:52 AM   Specimen: STOOL  Result Value Ref Range Status   C Diff antigen POSITIVE (A) NEGATIVE Final   C Diff toxin NEGATIVE NEGATIVE Final   C Diff interpretation Results are indeterminate. See PCR results.  Final    Comment: Performed at [facility], [place] [address], [place], [place] [address]  Culture, Urine     Status: Abnormal   Collection Time: 07/28/19 11:32 AM   Specimen: Urine, Clean Catch  Result Value Ref Range Status   Specimen Description   Final    URINE, CLEAN CATCH Performed at [facility], [place] [address], [place], [place] [address]    Special Requests   Final    Normal Performed at [facility], [place] [address] [place],  [place] [address]    Culture 20,000 COLONIES/mL PSEUDOMONAS AERUGINOSA (A)  Final   Report Status 07/30/2019 FINAL  Final   Organism ID, Bacteria PSEUDOMONAS AERUGINOSA (A)  Final      Susceptibility   Pseudomonas aeruginosa - MIC*    CEFTAZIDIME 4 SENSITIVE Sensitive     CIPROFLOXACIN 0.5 SENSITIVE Sensitive     GENTAMICIN <=1 SENSITIVE Sensitive     IMIPENEM 0.5 SENSITIVE Sensitive     PIP/TAZO 8 SENSITIVE Sensitive     CEFEPIME 2 SENSITIVE Sensitive     * 20,000 COLONIES/mL PSEUDOMONAS AERUGINOSA  MRSA PCR Screening     Status: None   Collection Time: 07/28/19  4:53 PM   Specimen: Urine, Clean Catch; Nasopharyngeal  Result Value Ref Range Status   MRSA by PCR NEGATIVE NEGATIVE Final    Comment:        The GeneXpert MRSA Assay (FDA approved for NASAL specimens only), is one component of a comprehensive MRSA colonization surveillance program. It is not intended to diagnose MRSA infection nor to guide or monitor treatment for MRSA infections. Performed at [facility], [place] [address], [place], [place] [address]      Radiology Studies: No results found.  Scheduled Meds: . apixaban  5 mg Oral BID  . feeding supplement (ENSURE ENLIVE)  237 mL Oral BID BM  . feeding supplement (PRO-STAT SUGAR FREE 64)  30 mL Oral BID  . ferrous sulfate  325 mg Oral Q breakfast  . insulin aspart  0-9 Units Subcutaneous TID WC  . multivitamin with minerals  1 tablet Oral Daily  . pantoprazole  40 mg Oral Daily  . potassium chloride  40 mEq Oral BID  . saccharomyces boulardii  250 mg Oral BID   Continuous Infusions: . sodium chloride 250 mL (08/01/19 0547)  . magnesium sulfate bolus IVPB       LOS: 6 days   Marylu Lund, MD [facility] Pager On Amion  If 7PM-7AM, please contact night-coverage 08/01/2019, 2:51 PM

## 2019-08-02 ENCOUNTER — Inpatient Hospital Stay (HOSPITAL_COMMUNITY): Payer: Medicare Other

## 2019-08-02 DIAGNOSIS — M7989 Other specified soft tissue disorders: Secondary | ICD-10-CM

## 2019-08-02 LAB — COMPREHENSIVE METABOLIC PANEL
ALT: 11 U/L (ref 0–44)
AST: 13 U/L — ABNORMAL LOW (ref 15–41)
Albumin: 2.4 g/dL — ABNORMAL LOW (ref 3.5–5.0)
Alkaline Phosphatase: 70 U/L (ref 38–126)
Anion gap: 8 (ref 5–15)
BUN: 6 mg/dL — ABNORMAL LOW (ref 8–23)
CO2: 20 mmol/L — ABNORMAL LOW (ref 22–32)
Calcium: 7.6 mg/dL — ABNORMAL LOW (ref 8.9–10.3)
Chloride: 112 mmol/L — ABNORMAL HIGH (ref 98–111)
Creatinine, Ser: 0.36 mg/dL — ABNORMAL LOW (ref 0.44–1.00)
GFR calc Af Amer: >60 mL/min (ref >60)
GFR calc non Af Amer: >60 mL/min (ref >60)
Glucose, Bld: 140 mg/dL — ABNORMAL HIGH (ref 70–99)
Potassium: 3.3 mmol/L — ABNORMAL LOW (ref 3.5–5.1)
Sodium: 140 mmol/L (ref 135–145)
Total Bilirubin: 0.6 mg/dL (ref 0.3–1.2)
Total Protein: 6.4 g/dL — ABNORMAL LOW (ref 6.5–8.1)

## 2019-08-02 LAB — GLUCOSE, CAPILLARY
Glucose-Capillary: 124 mg/dL — ABNORMAL HIGH (ref 70–99)
Glucose-Capillary: 207 mg/dL — ABNORMAL HIGH (ref 70–99)
Glucose-Capillary: 145 mg/dL — ABNORMAL HIGH (ref 70–99)
Glucose-Capillary: 126 mg/dL — ABNORMAL HIGH (ref 70–99)

## 2019-08-02 LAB — MAGNESIUM: Magnesium: 1.7 mg/dL (ref 1.7–2.4)

## 2019-08-02 MED ORDER — POTASSIUM CHLORIDE 20 MEQ PO PACK
40.0000 meq | PACK | Freq: Two times a day (BID) | ORAL | Status: AC
  Administered 2019-08-02 (×2): 40 meq via ORAL
  Filled 2019-08-02 (×2): qty 2

## 2019-08-02 MED ORDER — MAGNESIUM SULFATE 2 GM/50ML IV SOLN
2.0000 g | Freq: Once | INTRAVENOUS | Status: AC
  Administered 2019-08-02: 2 g via INTRAVENOUS
  Filled 2019-08-02: qty 50

## 2019-08-02 NOTE — Progress Notes (Signed)
Right upper extremity venous duplex exam completed.  Results given to [facility], RN.  Preliminary results can be found under CV proc under chart review.  08/02/2019 4:19 PM  Maximus Hoffert, K., RDMS, RVT
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
## 2019-08-03 LAB — BASIC METABOLIC PANEL
Anion gap: 9 (ref 5–15)
BUN: <5 mg/dL — ABNORMAL LOW (ref 8–23)
CO2: 17 mmol/L — ABNORMAL LOW (ref 22–32)
Calcium: 7.9 mg/dL — ABNORMAL LOW (ref 8.9–10.3)
Chloride: 114 mmol/L — ABNORMAL HIGH (ref 98–111)
Creatinine, Ser: 0.34 mg/dL — ABNORMAL LOW (ref 0.44–1.00)
GFR calc Af Amer: >60 mL/min (ref >60)
GFR calc non Af Amer: >60 mL/min (ref >60)
Glucose, Bld: 116 mg/dL — ABNORMAL HIGH (ref 70–99)
Potassium: 4.4 mmol/L (ref 3.5–5.1)
Sodium: 140 mmol/L (ref 135–145)

## 2019-08-03 LAB — CBC
HCT: 36.5 % (ref 36.0–46.0)
Hemoglobin: 11.7 g/dL — ABNORMAL LOW (ref 12.0–15.0)
MCH: 26.9 pg (ref 26.0–34.0)
MCHC: 32.1 g/dL (ref 30.0–36.0)
MCV: 83.9 fL (ref 80.0–100.0)
Platelets: 405 10*3/uL — ABNORMAL HIGH (ref 150–400)
RBC: 4.35 MIL/uL (ref 3.87–5.11)
RDW: 17.2 % — ABNORMAL HIGH (ref 11.5–15.5)
WBC: 9 10*3/uL (ref 4.0–10.5)
nRBC: 0 % (ref 0.0–0.2)

## 2019-08-03 LAB — GLUCOSE, CAPILLARY
Glucose-Capillary: 115 mg/dL — ABNORMAL HIGH (ref 70–99)
Glucose-Capillary: 143 mg/dL — ABNORMAL HIGH (ref 70–99)

## 2019-08-03 LAB — MAGNESIUM: Magnesium: 1.4 mg/dL — ABNORMAL LOW (ref 1.7–2.4)

## 2019-08-03 MED ORDER — MAGNESIUM SULFATE 4 GM/100ML IV SOLN
4.0000 g | Freq: Once | INTRAVENOUS | Status: AC
  Administered 2019-08-03: 4 g via INTRAVENOUS
  Filled 2019-08-03: qty 100

## 2019-08-03 NOTE — TOC Progression Note (Signed)
Transition of Care [facility]) - Progression Note    Patient Details  Name: Jocelyn Sanchez MRN: [id number] Date of Birth: 1955-06-22  Transition of Care [facility]) CM/SW Contact  Joaquin Courts, RN Phone Number: 08/03/2019, 3:52 PM  Clinical Narrative:  CM received call from bedside RN stating patient came with a hoyer lift pad but at this time it is uncertain where the pad is. Patient uses pad at home for transfers and reports she got the equipment from [employer].  [employer] has since become [employer].  CM spoke with [employer] rep who was able to find a record of the equipment provided to patient and states that a replacement pad will wither be sent by courier tomorrow or shipped overnight. Patient to dc home today by PTAR.  Transport paperwork provided to bedside RN.      Expected Discharge Plan: [place] Barriers to Discharge: Continued Medical Work up  Expected Discharge Plan and Services Expected Discharge Plan: [place]   Discharge Planning Services: CM Consult   Living arrangements for the past 2 months: Single Family Home Expected Discharge Date: 08/03/19                         HH Arranged: RN [place] Agency: [place] ([place]) Date [place]: 07/29/19 [place]: [id number] Representative spoke with at [place]: [place] ([place]) Interventions    Readmission Risk Interventions Readmission Risk Prevention Plan 07/29/2019  Transportation Screening Complete  PCP or Specialist Appt within 3-5 Days Complete  HRI or [place] Complete  Social Work Consult for [place] Planning/Counseling Complete  Palliative Care Screening Complete  Medication Review Press photographer) Complete  Some recent data might be hidden

## 2019-08-03 NOTE — Discharge Summary (Signed)
Physician Discharge Summary  Jocelyn Sanchez [id number] DOB: 05-17-1956 DOA: 07/26/2019  PCP: Dixie Dials, MD  Admit date: 07/26/2019 Discharge date: 08/03/2019  Admitted From: Home Disposition:  Home  Recommendations for Outpatient Follow-up:  1. Follow up with PCP in 1-2 weeks  Home Health:RN   Discharge Condition:Stable CODE STATUS:Full Diet recommendation: Full liquid  Brief/Interim Summary: 63 y.o.femalewith medical history significant for chronicanemia, type 2 diabetes mellitus, gait disorder, hypertension, GI bleed, gastric and duodenal angiodysplasia, lymphedema, multiple sclerosis, history of pulmonary embolism on Eliquis, who presented to the emergency department with complaints of constipation associated with abdominal pain. She had not had bowel movement for 48 hours and was feeling uncomfortable. Also has complaint of decreased urinary output over the last 24 hours.   ED Course:K+ 6.6 sodium 126 white count 12.0 hemoglobin 7.7 and lactic acid was however normal, she was also found to be dehydrated. Blood cultures were obtained.Elevated creatinine with acute kidney injury in addition to the hyperkalemia. EDMD consulted with nephrology who advised to start her on bicarb infusion and hydration. Hospital course complicatedby diarrhea, C. difficile antigen positive. Toxin negative. PCR pending. Drop in hemoglobin with positive FOBT.Hg down to 6.5. Post 2U Prbcs increased to 12.0  Discharge Diagnoses:  Principal Problem:   Septic shock ([place]) Active Problems:   Multiple sclerosis ([place])   Diabetes mellitus with peripheral vascular disease ([place])   Leukocytosis   Sepsis ([place])   Resolved severe sepsis likely multifactorial secondary topresumedUTI with suspected intra-abdominal infection Presented with leukocytosis, tachycardia, diffuse abdominal pain, hypotension, elevated lactic acid 3.6. Sepsis physiology improved. CT abdomen and pelvis without contrast  showed suspected acute pancreatitis. Large stool burden in the rectum with circumferential rectal wall thickening muscular hypertrophy possibly indicating mild stercoral colitis, suspected left pyelonephritis. UA positive, urine culture was reincubated for better growth. MRSA negative Blood cultures negative to date. Initially started on IV Zosyn and IV vancomycin empirically, later switched to cefepime and IV Flagyl on 07/28/2019 Completed 5 days total of IV antibiotics. Stable at this time  Possible mild stercoral colitis on CT Hx of chronic constipation Reviewed on CT abd pelvis without contrast GI [facility]had been following Received water enema, now with diarrhea, rectal tube in place with dark liquid stools noted. Stool output seems to be slowing, however pt had required rectal tube given quantity of loose stool output Rectal tube now removed with resolution of diarrhea  Hx of C-diff diarrhea Diarrhea resolved Given florastor in hospital Stool culture had been pending  Refractory hypokalemia, multifactorial secondary to bicarb infusion and diarrhea.  Very labile electrolytes likely as a result of on-going diarrhea. Potassium corrected Mg low on day of d/c, corrected Recommend repeat electrolytes after d/c  Severe hypomagnesemia Possibly from GI losses with diarrhea Replaced  Improving acute pancreatitis, unclear etiology No gallstone, no alcohol use, no medicationthatmay have caused Mildly elevated triglyceride, likely not the cause Elevated lipase >170>> 2700>> 650>> 120 Started on soft diet today. Due to nausea will de-escalate down to full liquid diet. Continue supportive care. Continue gentle IV fluid hydration. Continue to closely monitor electrolytes and volume status in the setting of chronic heart failure with reduced EF. No evidence of gallstone or biliary duct dilatation on CT Slight inflammation adjacent to the proximal body of the pancreas and adjacent  to the uncinate process of the pancreas on CT abdomen and pelvis No evidence of pancreatic mass noted  Presumed UTI with suspected left pyelonephritis Positive UA for pyuria Urine culturereintubated for better growth., Continue to  follow cultures. Continue empiric antibiotic. Will complete 5 days per above Blood cultures noted to benegative at this time  Chronic constipation now with possible stercoral colitis  Stercoral colitis management per GI.  ResolvedAKI likely prerenal in the setting of poor oral intake and dehydration Hypovolemic on exam Presented with creatinine of 4.65 Creatinine back to baseline of 0.6 with GFR greater than 60. Renal function normalized  Multiple bilateral renal calculi including partial staghorn calculi on CT abdomen and pelvis Monitor urine output and symptomatology  Resolved anion gap metabolic acidosis likely secondary to acute renal failure Presented with serum bicarb of 10 and anion gap of 14>> serum bicarb 26 and anion gap of 13. Stopped bicarb dripon 07/28/2019.  Acute blood loss, unclear etiology Positive FOBT with no overt signs of bleeding Iron studies indicative of iron deficiency.Continueferrous sulfate 325 mg daily. Hemoglobin dropped to 6.6, this was confirmed with repeated H&H. Post 2 unit PRBC transfusion hemoglobin 12 Hgb had remained stable   Sclerosis involving the femoral heads bilaterally, possible early avascular necrosis Incidental finding  Physical debility Once stable PT and OT with assistance and fall precautions.  Chronic systolic CHF with LVEF [id number] Last 2D echo done on 08/09/2017 showed LVEF 40-45% with diffuse hypokinesis Closely monitor volume status while on IV fluid to avoid complications such as pulmonary edema or bilateral pleural effusions Continue Strict I's and O's and daily weight  History of PE Stable Continue Eliquis as tolerated  Type 2 diabetes A1c 6.7 on 07/27/19 Continue insulin  sliding scale as needed  History of MS No acute issues at this time  R arm swelling secondary to superfical thrombus Unilateral swelling noted RUE doppler performed, finding of superficial thrombus. Recommend heat pack, elevation of extremity and analgesia as needed  Discharge Instructions   Allergies as of 08/03/2019      Reactions   Sulfa Antibiotics Shortness Of Breath, Swelling      Medication List    STOP taking these medications   cefdinir 300 MG capsule Commonly known as: OMNICEF   nitrofurantoin (macrocrystal-monohydrate) 100 MG capsule Commonly known as: MACROBID     TAKE these medications   acetaminophen 500 MG tablet Commonly known as: TYLENOL Take 1,000 mg by mouth every 6 (six) hours as needed for moderate pain.   carvedilol 6.25 MG tablet Commonly known as: COREG Take 6.25 mg by mouth 2 (two) times daily.   Eliquis 5 MG Tabs tablet Generic drug: apixaban Take 5 mg by mouth 2 (two) times daily.   Entresto 24-26 MG Generic drug: sacubitril-valsartan Take 1 tablet by mouth 2 (two) times daily.   famotidine 20 MG tablet Commonly known as: PEPCID Take 1 tablet (20 mg total) by mouth 2 (two) times daily.   Farxiga 5 MG Tabs tablet Generic drug: dapagliflozin propanediol Take 5 mg by mouth daily.   metFORMIN 500 MG tablet Commonly known as: GLUCOPHAGE Take 500 mg by mouth 2 (two) times daily.   ondansetron 4 MG tablet Commonly known as: ZOFRAN Take 4 mg by mouth every 6 (six) hours as needed for nausea.   traZODone 50 MG tablet Commonly known as: DESYREL Take 50 mg by mouth at bedtime.      Follow-up Information    primary care physician. Schedule an appointment as soon as possible for a visit.        [facility].   Specialty: Home Health Services Why: [place] nursing/physical therapy       Dixie Dials, MD. Schedule  an appointment as soon as possible for a visit in 1 week(s).   Specialty:  Cardiology Contact information: [place] [address] [phone]          Allergies  Allergen Reactions  . Sulfa Antibiotics Shortness Of Breath and Swelling    Consultations:  Nephrology  GI  Procedures/Studies: CT ABDOMEN PELVIS WO CONTRAST  Result Date: 07/26/2019 CLINICAL DATA:  64 year old with multiple sclerosis which leaves her bed ridden, presenting with difficulty having bowel movements and difficulty urinating, abdominal distension, and abdominal tenderness. She has abdominal guarding on physical examination. Laboratory data indicate leukocytosis and hyperkalemia. EXAM: CT ABDOMEN AND PELVIS WITHOUT CONTRAST TECHNIQUE: Multidetector CT imaging of the abdomen and pelvis was performed following the standard protocol without IV contrast. COMPARISON:  08/14/2017 and earlier. FINDINGS: Lower chest: Minimal scarring in the LEFT LOWER LOBE posteriorly. Visualized lung bases otherwise clear. Asymmetric mild subpleural fat at the LEFT base which mimics a pleural effusion. Heart size normal. Extensive three-vessel coronary atherosclerosis. No pericardial effusion. Hepatobiliary: Normal unenhanced appearance of the liver. Contracted gallbladder without evidence of calcified gallstones. No biliary ductal dilation. Pancreas: Edema/inflammation adjacent to the proximal body of the pancreas and adjacent to the uncinate. Allowing for the unenhanced technique, no evidence of pancreatic mass. Spleen: Normal unenhanced appearance. Adrenals/Urinary Tract: Normal appearing adrenal glands. Multiple BILATERAL renal calculi, including partial staghorn calculi, as noted previously. No obstructing ureteral calculus on either side. Chronic RIGHT ureteral dilation. Edema involving the UPPER pole of the LEFT kidney, as it has a Hounsfield measurement of approximately 6 where as the remaining normal parenchyma has a Hounsfield measurement of approximately 30; this does not represent a  simple cyst, however, as there was no cyst in this location on the prior CT. Urinary bladder decompressed and unremarkable. Stomach/Bowel: Stomach normal in appearance for the degree of distention. Normal-appearing small bowel. Mobile cecum positioned in the RIGHT mid abdomen. Large stool burden in the rectum, with circumferential rectal wall thickening/muscular hypertrophy. Normal colonic stool burden elsewhere. No focal abnormality involving the colon. Normal appendix in the RIGHT upper pelvis. Vascular/Lymphatic: Severe aortoiliofemoral atherosclerosis without evidence of aneurysm. Numerous normal sized retroperitoneal lymph nodes without evidence of a nodal mass. Reproductive: Enlarged uterus containing fibroids as noted previously. No adnexal masses. Other: None. Musculoskeletal: Generalized muscular atrophy as noted previously, presumably due to the patient's MS. Osseous demineralization. Sclerosis involving the femoral heads bilaterally without evidence of subchondral collapse. No acute findings. IMPRESSION: 1. Edema/inflammation adjacent to the proximal body of the pancreas and adjacent to the uncinate process of the pancreas, query acute pancreatitis. 2. Large stool burden in the rectum with circumferential rectal wall thickening/muscular hypertrophy, possibly indicating mild stercoral colitis. 3. Multiple BILATERAL renal calculi, including partial staghorn calculi. No obstructing ureteral calculus on either side. 4. Edema involving the UPPER pole of the LEFT kidney suspicious for pyelonephritis. Please correlate with urinalysis. 5. Enlarged uterus containing fibroids as noted previously. 6. 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 Aortic Atherosclerosis (ICD10-I70.0). Electronically Signed   By: Evangeline Dakin M.D.   On: 07/26/2019 15:51   DG CHEST PORT 1 VIEW  Result Date: 07/27/2019 CLINICAL DATA:   Shortness of breath. EXAM: PORTABLE CHEST 1 VIEW COMPARISON:  March 28, 2019. FINDINGS: The heart size and mediastinal contours are within normal limits. Both lungs are clear. No pneumothorax or pleural effusion is noted. The visualized skeletal structures are unremarkable. IMPRESSION: No active disease. Electronically Signed  By: Marijo Conception M.D.   On: 07/27/2019 08:38   VAS [place] UPPER EXTREMITY VENOUS DUPLEX  Result Date: 08/02/2019 UPPER VENOUS STUDY  Indications: Swelling Risk Factors: History of PE. Limitations: Body habitus, poor ultrasound/tissue interface, bandages and lymphedema, MS, limb contracted. Comparison Study: No prior exam. Performing Technologist: Baldwin Crown ARDMS, RVT  Examination Guidelines: A complete evaluation includes B-mode imaging, spectral Doppler, color Doppler, and power Doppler as needed of all accessible portions of each vessel. Bilateral testing is considered an integral part of a complete examination. Limited examinations for reoccurring indications may be performed as noted.  Right Findings: +----------+------------+---------+-----------+----------+-------+ RIGHT     CompressiblePhasicitySpontaneousPropertiesSummary +----------+------------+---------+-----------+----------+-------+ IJV           Full       Yes       Yes                      +----------+------------+---------+-----------+----------+-------+ Subclavian    Full       Yes       Yes                      +----------+------------+---------+-----------+----------+-------+ Axillary      Full       Yes       Yes                      +----------+------------+---------+-----------+----------+-------+ Brachial      Full       Yes       Yes                      +----------+------------+---------+-----------+----------+-------+ Radial        Full                                          +----------+------------+---------+-----------+----------+-------+ Ulnar         Full                                           +----------+------------+---------+-----------+----------+-------+ Cephalic      None                                   Acute  +----------+------------+---------+-----------+----------+-------+ Basilic       Full                                          +----------+------------+---------+-----------+----------+-------+ Limited visualization of axillary, brachial, and basilic veins due to arm contracted and immobile.  Summary:  Right: Findings consistent with acute superficial vein thrombosis involving the right cephalic vein. However, poor visualization of the axillary, brachial, and basilic veins due to arm contracted and immobile.  *See table(s) above for measurements and observations.    Preliminary    [place] EKG SITE RITE  Result Date: 07/27/2019 If Site Rite image not attached, placement could not be confirmed due to current cardiac rhythm.    Subjective: Eager to go home  Discharge Exam: Vitals:   08/02/19 2152 08/03/19 0430  BP: (!) 141/90 (!) 141/50  Pulse: 92 99  Resp: 20 18  Temp: 97.7 F (36.5 C) 97.7 F (36.5 C)  SpO2: 100% 100%   Vitals:   08/02/19 2128 08/02/19 2152 08/03/19 0430 08/03/19 0500  BP:  (!) 141/90 (!) 141/50   Pulse:  92 99   Resp:  20 18   Temp:  97.7 F (36.5 C) 97.7 F (36.5 C)   TempSrc:  Oral Oral   SpO2:  100% 100%   Weight: 61.4 kg   65 kg  Height: 5\' 3"  (1.6 m)       General: Pt is alert, awake, not in acute distress Cardiovascular: RRR, S1/S2 +, no rubs, no gallops Respiratory: CTA bilaterally, no wheezing, no rhonchi Abdominal: Soft, NT, ND, bowel sounds + Extremities: no edema, no cyanosis   The results of significant diagnostics from this hospitalization (including imaging, microbiology, ancillary and laboratory) are listed below for reference.     Microbiology: Recent Results (from the past 240 hour(s))  Culture, blood (routine x 2)     Status: None   Collection Time: 07/26/19   1:00 PM   Specimen: BLOOD RIGHT FOREARM  Result Value Ref Range Status   Specimen Description   Final    BLOOD RIGHT FOREARM Performed at [facility] [address] [place], [place] [address]    Special Requests   Final    BAA Blood Culture adequate volume Performed at [facility] [address] [place], [place] [address]    Culture   Final    NO GROWTH 5 DAYS Performed at [facility], [place] [address] [place], [place] [address]    Report Status 07/31/2019 FINAL  Final  Respiratory Panel by RT PCR (Flu A&B, Covid) - Nasopharyngeal Swab     Status: None   Collection Time: 07/26/19  7:38 PM   Specimen: Nasopharyngeal Swab  Result Value Ref Range Status   SARS Coronavirus 2 by RT PCR NEGATIVE NEGATIVE Final    Comment: (NOTE) SARS-CoV-2 target nucleic acids are NOT DETECTED. The SARS-CoV-2 RNA is generally detectable in upper respiratoy specimens during the acute phase of infection. The lowest concentration of SARS-CoV-2 viral copies this assay can detect is 131 copies/mL. A negative result does not preclude SARS-Cov-2 infection and should not be used as the sole basis for treatment or other patient management decisions. A negative result may occur with  improper specimen collection/handling, submission of specimen other than nasopharyngeal swab, presence of viral mutation(s) within the areas targeted by this assay, and inadequate number of viral copies (<131 copies/mL). A negative result must be combined with clinical observations, patient history, and epidemiological information. The expected result is Negative. Fact Sheet for Patients:  PinkCheek.be Fact Sheet for Healthcare Providers:  GravelBags.it This test is not yet ap proved or cleared by the [place] FDA and  has been authorized for detection and/or diagnosis of SARS-CoV-2 by FDA under an Emergency Use  Authorization (EUA). This EUA will remain  in effect (meaning this test can be used) for the duration of the COVID-19 declaration under Section 564(b)(1) of the Act, 21 U.S.C. section 360bbb-3(b)(1), unless the authorization is terminated or revoked sooner.    Influenza A by PCR NEGATIVE NEGATIVE Final   Influenza B by PCR NEGATIVE NEGATIVE Final    Comment: (NOTE) The Xpert Xpress SARS-CoV-2/FLU/RSV assay is intended as an aid in  the diagnosis of influenza from Nasopharyngeal swab specimens and  should not be used as a sole basis for treatment. Nasal washings and  aspirates are unacceptable for Xpert Xpress SARS-CoV-2/FLU/RSV  testing. Fact Sheet for Patients: PinkCheek.be Fact Sheet for Healthcare Providers: GravelBags.it This test is not yet approved or cleared by the [place] FDA and  has been authorized for detection and/or diagnosis of SARS-CoV-2 by  FDA under an Emergency Use Authorization (EUA). This EUA will remain  in effect (meaning this test can be used) for the duration of the  Covid-19 declaration under Section 564(b)(1) of the Act, 21  U.S.C. section 360bbb-3(b)(1), unless the authorization is  terminated or revoked. Performed at [facility], [place] [address] [place], [place] [address]   Culture, blood (routine x 2)     Status: None   Collection Time: 07/27/19  7:10 AM   Specimen: BLOOD  Result Value Ref Range Status   Specimen Description   Final    BLOOD RIGHT HAND Performed at [facility] [address], [place], [place] [address]    Special Requests   Final    BOTTLES DRAWN AEROBIC AND ANAEROBIC Blood Culture adequate volume Performed at [facility] [address] [place], [place] [address]    Culture   Final    NO GROWTH 5 DAYS Performed at [facility], [place] [address], [place], [place] [address]    Report Status 08/01/2019  FINAL  Final  C difficile quick scan w PCR reflex     Status: Abnormal   Collection Time: 07/27/19  7:52 AM   Specimen: STOOL  Result Value Ref Range Status   C Diff antigen POSITIVE (A) NEGATIVE Final   C Diff toxin NEGATIVE NEGATIVE Final   C Diff interpretation Results are indeterminate. See PCR results.  Final    Comment: Performed at [facility], [place] [address] [place], [place] [address]  Culture, Urine     Status: Abnormal   Collection Time: 07/28/19 11:32 AM   Specimen: Urine, Clean Catch  Result Value Ref Range Status   Specimen Description   Final    URINE, CLEAN CATCH Performed at [facility], [place] [address] [place], [place] [address]    Special Requests   Final    Normal Performed at [facility], [place] [address], [place], [place] [address]    Culture 20,000 COLONIES/mL PSEUDOMONAS AERUGINOSA (A)  Final   Report Status 07/30/2019 FINAL  Final   Organism ID, Bacteria PSEUDOMONAS AERUGINOSA (A)  Final      Susceptibility   Pseudomonas aeruginosa - MIC*    CEFTAZIDIME 4 SENSITIVE Sensitive     CIPROFLOXACIN 0.5 SENSITIVE Sensitive     GENTAMICIN <=1 SENSITIVE Sensitive     IMIPENEM 0.5 SENSITIVE Sensitive     PIP/TAZO 8 SENSITIVE Sensitive     CEFEPIME 2 SENSITIVE Sensitive     * 20,000 COLONIES/mL PSEUDOMONAS AERUGINOSA  MRSA PCR Screening     Status: None   Collection Time: 07/28/19  4:53 PM   Specimen: Urine, Clean Catch; Nasopharyngeal  Result Value Ref Range Status   MRSA by PCR NEGATIVE NEGATIVE Final    Comment:        The GeneXpert MRSA Assay (FDA approved for NASAL specimens only), is one component of a comprehensive MRSA colonization surveillance program. It is not intended to diagnose MRSA infection nor to guide or monitor treatment for MRSA infections. Performed at [facility], [place] [address] [place], [place] [address]      Labs: BNP (last 3 results) No results  for input(s): BNP in the last 8760 hours. Basic Metabolic Panel: Recent Labs  Lab 07/29/19 0522 07/29/19 0552 07/30/19 0735 07/31/19 0518 07/31/19 0800 08/01/19 0531 08/01/19 0745 08/02/19 0459 08/03/19 0545  NA   < >  --  138 140  --  140  --  140 140  K   < >  --  3.4* 6.0*  --  3.0*  --  3.3* 4.4  CL   < >  --  111 111  --  114*  --  112* 114*  CO2   < >  --  18* 17*  --  18*  --  20* 17*  GLUCOSE   < >  --  136* 124*  --  134*  --  140* 116*  BUN   < >  --  15 11  --  8  --  6* <5*  CREATININE   < >  --  0.50 0.52  --  0.43*  --  0.36* 0.34*  CALCIUM   < >  --  6.8* 7.0*  --  7.3*  --  7.6* 7.9*  MG  --  0.6*  --   --  1.3*  --  1.3* 1.7 1.4*   < > = values in this interval not displayed.   Liver Function Tests: Recent Labs  Lab 08/01/19 0531 08/02/19 0459  AST 12* 13*  ALT 9 11  ALKPHOS 65 70  BILITOT 0.6 0.6  PROT 5.7* 6.4*  ALBUMIN 2.1* 2.4*   Recent Labs  Lab 07/28/19 0526 07/29/19 0522 07/30/19 0735 07/31/19 0518  LIPASE 650* 120* 119* 88*   No results for input(s): AMMONIA in the last 168 hours. CBC: Recent Labs  Lab 07/28/19 0526 07/29/19 0522 07/30/19 0409 07/31/19 0518 08/03/19 0545  WBC 11.4* 13.6* 13.2* 14.2* 9.0  NEUTROABS 8.8* 8.8* 9.1* 8.9*  --   HGB 12.0 11.0* 12.8 12.1 11.7*  HCT 35.6* 32.9* 36.1 35.7* 36.5  MCV 79.5* 82.0 77.3* 80.4 83.9  PLT 419* 385 214 454* 405*   Cardiac Enzymes: No results for input(s): CKTOTAL, CKMB, CKMBINDEX, TROPONINI in the last 168 hours. BNP: Invalid input(s): POCBNP CBG: Recent Labs  Lab 08/02/19 1152 08/02/19 1642 08/02/19 2117 08/03/19 0749 08/03/19 1214  GLUCAP 207* 145* 126* 115* 143*   D-Dimer No results for input(s): DDIMER in the last 72 hours. Hgb A1c No results for input(s): HGBA1C in the last 72 hours. Lipid Profile No results for input(s): CHOL, HDL, LDLCALC, TRIG, CHOLHDL, LDLDIRECT in the last 72 hours. Thyroid function studies No results for input(s): TSH, T4TOTAL, T3FREE,  THYROIDAB in the last 72 hours.  Invalid input(s): FREET3 Anemia work up No results for input(s): VITAMINB12, FOLATE, FERRITIN, TIBC, IRON, RETICCTPCT in the last 72 hours. Urinalysis    Component Value Date/Time   COLORURINE YELLOW 07/26/2019 1551   APPEARANCEUR TURBID (A) 07/26/2019 1551   LABSPEC 1.015 07/26/2019 1551   PHURINE 6.0 07/26/2019 1551   GLUCOSEU NEGATIVE 07/26/2019 1551   HGBUR LARGE (A) 07/26/2019 1551   BILIRUBINUR NEGATIVE 07/26/2019 1551   KETONESUR 5 (A) 07/26/2019 1551   PROTEINUR 100 (A) 07/26/2019 1551   UROBILINOGEN 0.2 03/01/2015 1913   NITRITE NEGATIVE 07/26/2019 1551   LEUKOCYTESUR MODERATE (A) 07/26/2019 1551   Sepsis Labs Invalid input(s): PROCALCITONIN,  WBC,  LACTICIDVEN Microbiology Recent Results (from the past 240 hour(s))  Culture, blood (routine x 2)     Status: None   Collection Time: 07/26/19  1:00 PM  Specimen: BLOOD RIGHT FOREARM  Result Value Ref Range Status   Specimen Description   Final    BLOOD RIGHT FOREARM Performed at [facility] [address], [place], [place] [address]    Special Requests   Final    BAA Blood Culture adequate volume Performed at [facility] [address], [place], [place] [address]    Culture   Final    NO GROWTH 5 DAYS Performed at [facility], [place] [address], [place], [place] [address]    Report Status 07/31/2019 FINAL  Final  Respiratory Panel by RT PCR (Flu A&B, Covid) - Nasopharyngeal Swab     Status: None   Collection Time: 07/26/19  7:38 PM   Specimen: Nasopharyngeal Swab  Result Value Ref Range Status   SARS Coronavirus 2 by RT PCR NEGATIVE NEGATIVE Final    Comment: (NOTE) SARS-CoV-2 target nucleic acids are NOT DETECTED. The SARS-CoV-2 RNA is generally detectable in upper respiratoy specimens during the acute phase of infection. The lowest concentration of SARS-CoV-2 viral copies this assay can detect is 131 copies/mL. A negative result  does not preclude SARS-Cov-2 infection and should not be used as the sole basis for treatment or other patient management decisions. A negative result may occur with  improper specimen collection/handling, submission of specimen other than nasopharyngeal swab, presence of viral mutation(s) within the areas targeted by this assay, and inadequate number of viral copies (<131 copies/mL). A negative result must be combined with clinical observations, patient history, and epidemiological information. The expected result is Negative. Fact Sheet for Patients:  PinkCheek.be Fact Sheet for Healthcare Providers:  GravelBags.it This test is not yet ap proved or cleared by the [place] FDA and  has been authorized for detection and/or diagnosis of SARS-CoV-2 by FDA under an Emergency Use Authorization (EUA). This EUA will remain  in effect (meaning this test can be used) for the duration of the COVID-19 declaration under Section 564(b)(1) of the Act, 21 U.S.C. section 360bbb-3(b)(1), unless the authorization is terminated or revoked sooner.    Influenza A by PCR NEGATIVE NEGATIVE Final   Influenza B by PCR NEGATIVE NEGATIVE Final    Comment: (NOTE) The Xpert Xpress SARS-CoV-2/FLU/RSV assay is intended as an aid in  the diagnosis of influenza from Nasopharyngeal swab specimens and  should not be used as a sole basis for treatment. Nasal washings and  aspirates are unacceptable for Xpert Xpress SARS-CoV-2/FLU/RSV  testing. Fact Sheet for Patients: PinkCheek.be Fact Sheet for Healthcare Providers: GravelBags.it This test is not yet approved or cleared by the [place] FDA and  has been authorized for detection and/or diagnosis of SARS-CoV-2 by  FDA under an Emergency Use Authorization (EUA). This EUA will remain  in effect (meaning this test can be used) for the duration of  the  Covid-19 declaration under Section 564(b)(1) of the Act, 21  U.S.C. section 360bbb-3(b)(1), unless the authorization is  terminated or revoked. Performed at [facility], [place] [address], [place], [place] [address]   Culture, blood (routine x 2)     Status: None   Collection Time: 07/27/19  7:10 AM   Specimen: BLOOD  Result Value Ref Range Status   Specimen Description   Final    BLOOD RIGHT HAND Performed at [place] [address], [place], [place] [address]    Special Requests   Final    BOTTLES DRAWN AEROBIC AND ANAEROBIC Blood Culture adequate volume Performed  at [facility], [place] [address], [place], [place] [address]    Culture   Final    NO GROWTH 5 DAYS Performed at [facility], [place] [address], [place], [place] [address]    Report Status 08/01/2019 FINAL  Final  C difficile quick scan w PCR reflex     Status: Abnormal   Collection Time: 07/27/19  7:52 AM   Specimen: STOOL  Result Value Ref Range Status   C Diff antigen POSITIVE (A) NEGATIVE Final   C Diff toxin NEGATIVE NEGATIVE Final   C Diff interpretation Results are indeterminate. See PCR results.  Final    Comment: Performed at [facility], [place] [address] [place], [place] [address]  Culture, Urine     Status: Abnormal   Collection Time: 07/28/19 11:32 AM   Specimen: Urine, Clean Catch  Result Value Ref Range Status   Specimen Description   Final    URINE, CLEAN CATCH Performed at [facility], [place] [address] [place], [place] [address]    Special Requests   Final    Normal Performed at [facility], [place] [address] [place], [place] [address]    Culture 20,000 COLONIES/mL PSEUDOMONAS AERUGINOSA (A)  Final   Report Status 07/30/2019 FINAL  Final   Organism ID, Bacteria PSEUDOMONAS AERUGINOSA (A)  Final      Susceptibility   Pseudomonas aeruginosa - MIC*    CEFTAZIDIME 4  SENSITIVE Sensitive     CIPROFLOXACIN 0.5 SENSITIVE Sensitive     GENTAMICIN <=1 SENSITIVE Sensitive     IMIPENEM 0.5 SENSITIVE Sensitive     PIP/TAZO 8 SENSITIVE Sensitive     CEFEPIME 2 SENSITIVE Sensitive     * 20,000 COLONIES/mL PSEUDOMONAS AERUGINOSA  MRSA PCR Screening     Status: None   Collection Time: 07/28/19  4:53 PM   Specimen: Urine, Clean Catch; Nasopharyngeal  Result Value Ref Range Status   MRSA by PCR NEGATIVE NEGATIVE Final    Comment:        The GeneXpert MRSA Assay (FDA approved for NASAL specimens only), is one component of a comprehensive MRSA colonization surveillance program. It is not intended to diagnose MRSA infection nor to guide or monitor treatment for MRSA infections. Performed at [facility], [place] [address] [place], [place] [address]    Time spent: 30 min  SIGNED:   Marylu Lund, MD  [facility] 08/03/2019, 12:59 PM  If 7PM-7AM, please contact night-coverage

## 2020-08-06 ENCOUNTER — Encounter (HOSPITAL_COMMUNITY): Payer: Self-pay | Admitting: Pediatrics

## 2020-08-06 ENCOUNTER — Inpatient Hospital Stay (HOSPITAL_COMMUNITY)
Admission: EM | Admit: 2020-08-06 | Discharge: 2020-08-12 | DRG: 377 | Disposition: A | Discharge status: Home Health | Payer: Medicare Other | Attending: Internal Medicine | Admitting: Internal Medicine

## 2020-08-06 ENCOUNTER — Emergency Department (HOSPITAL_COMMUNITY): Payer: Medicare Other

## 2020-08-06 ENCOUNTER — Other Ambulatory Visit: Payer: Self-pay

## 2020-08-06 DIAGNOSIS — I4891 Unspecified atrial fibrillation: Secondary | ICD-10-CM | POA: Diagnosis present

## 2020-08-06 DIAGNOSIS — R0602 Shortness of breath: Secondary | ICD-10-CM | POA: Diagnosis present

## 2020-08-06 DIAGNOSIS — K5521 Angiodysplasia of colon with hemorrhage: Secondary | ICD-10-CM | POA: Diagnosis present

## 2020-08-06 DIAGNOSIS — L89622 Pressure ulcer of left heel, stage 2: Secondary | ICD-10-CM | POA: Diagnosis present

## 2020-08-06 DIAGNOSIS — D649 Anemia, unspecified: Secondary | ICD-10-CM

## 2020-08-06 DIAGNOSIS — E871 Hypo-osmolality and hyponatremia: Secondary | ICD-10-CM | POA: Diagnosis present

## 2020-08-06 DIAGNOSIS — L89153 Pressure ulcer of sacral region, stage 3: Secondary | ICD-10-CM | POA: Diagnosis present

## 2020-08-06 DIAGNOSIS — Z7901 Long term (current) use of anticoagulants: Secondary | ICD-10-CM

## 2020-08-06 DIAGNOSIS — N390 Urinary tract infection, site not specified: Secondary | ICD-10-CM | POA: Diagnosis not present

## 2020-08-06 DIAGNOSIS — Z66 Do not resuscitate: Secondary | ICD-10-CM | POA: Diagnosis present

## 2020-08-06 DIAGNOSIS — I959 Hypotension, unspecified: Secondary | ICD-10-CM | POA: Diagnosis present

## 2020-08-06 DIAGNOSIS — G35 Multiple sclerosis: Secondary | ICD-10-CM | POA: Diagnosis present

## 2020-08-06 DIAGNOSIS — E872 Acidosis: Secondary | ICD-10-CM | POA: Diagnosis present

## 2020-08-06 DIAGNOSIS — Z1612 Extended spectrum beta lactamase (ESBL) resistance: Secondary | ICD-10-CM | POA: Diagnosis not present

## 2020-08-06 DIAGNOSIS — N179 Acute kidney failure, unspecified: Secondary | ICD-10-CM | POA: Diagnosis present

## 2020-08-06 DIAGNOSIS — Z7401 Bed confinement status: Secondary | ICD-10-CM | POA: Diagnosis not present

## 2020-08-06 DIAGNOSIS — K449 Diaphragmatic hernia without obstruction or gangrene: Secondary | ICD-10-CM | POA: Diagnosis present

## 2020-08-06 DIAGNOSIS — G35D Multiple sclerosis, unspecified: Secondary | ICD-10-CM | POA: Diagnosis present

## 2020-08-06 DIAGNOSIS — R578 Other shock: Secondary | ICD-10-CM | POA: Diagnosis present

## 2020-08-06 DIAGNOSIS — D72829 Elevated white blood cell count, unspecified: Secondary | ICD-10-CM

## 2020-08-06 DIAGNOSIS — Z86711 Personal history of pulmonary embolism: Secondary | ICD-10-CM | POA: Diagnosis not present

## 2020-08-06 DIAGNOSIS — K219 Gastro-esophageal reflux disease without esophagitis: Secondary | ICD-10-CM | POA: Diagnosis present

## 2020-08-06 DIAGNOSIS — D62 Acute posthemorrhagic anemia: Secondary | ICD-10-CM | POA: Diagnosis present

## 2020-08-06 DIAGNOSIS — Z87891 Personal history of nicotine dependence: Secondary | ICD-10-CM

## 2020-08-06 DIAGNOSIS — I110 Hypertensive heart disease with heart failure: Secondary | ICD-10-CM | POA: Diagnosis present

## 2020-08-06 DIAGNOSIS — I5042 Chronic combined systolic (congestive) and diastolic (congestive) heart failure: Secondary | ICD-10-CM | POA: Diagnosis present

## 2020-08-06 DIAGNOSIS — Z7984 Long term (current) use of oral hypoglycemic drugs: Secondary | ICD-10-CM

## 2020-08-06 DIAGNOSIS — B9620 Unspecified Escherichia coli [E. coli] as the cause of diseases classified elsewhere: Secondary | ICD-10-CM | POA: Diagnosis not present

## 2020-08-06 DIAGNOSIS — L8990 Pressure ulcer of unspecified site, unspecified stage: Secondary | ICD-10-CM | POA: Insufficient documentation

## 2020-08-06 DIAGNOSIS — E876 Hypokalemia: Secondary | ICD-10-CM | POA: Diagnosis not present

## 2020-08-06 DIAGNOSIS — Z820 Family history of epilepsy and other diseases of the nervous system: Secondary | ICD-10-CM

## 2020-08-06 DIAGNOSIS — E1151 Type 2 diabetes mellitus with diabetic peripheral angiopathy without gangrene: Secondary | ICD-10-CM | POA: Diagnosis not present

## 2020-08-06 DIAGNOSIS — Z79899 Other long term (current) drug therapy: Secondary | ICD-10-CM

## 2020-08-06 DIAGNOSIS — U071 COVID-19: Secondary | ICD-10-CM | POA: Diagnosis present

## 2020-08-06 DIAGNOSIS — Z882 Allergy status to sulfonamides status: Secondary | ICD-10-CM

## 2020-08-06 LAB — C-REACTIVE PROTEIN: CRP: 22.3 mg/dL — ABNORMAL HIGH (ref <1.0)

## 2020-08-06 LAB — CBC
HCT: 15.9 % — ABNORMAL LOW (ref 36.0–46.0)
Hemoglobin: 4.1 g/dL — CL (ref 12.0–15.0)
MCH: 16.4 pg — ABNORMAL LOW (ref 26.0–34.0)
MCHC: 25.8 g/dL — ABNORMAL LOW (ref 30.0–36.0)
MCV: 63.6 fL — ABNORMAL LOW (ref 80.0–100.0)
Platelets: 710 10*3/uL — ABNORMAL HIGH (ref 150–400)
RBC: 2.5 MIL/uL — ABNORMAL LOW (ref 3.87–5.11)
RDW: 22.5 % — ABNORMAL HIGH (ref 11.5–15.5)
WBC: 9.7 10*3/uL (ref 4.0–10.5)
nRBC: 1.2 % — ABNORMAL HIGH (ref 0.0–0.2)

## 2020-08-06 LAB — POC OCCULT BLOOD, ED: Fecal Occult Bld: NEGATIVE

## 2020-08-06 LAB — LACTIC ACID, PLASMA: Lactic Acid, Venous: 1 mmol/L (ref 0.5–1.9)

## 2020-08-06 LAB — RESP PANEL BY RT-PCR (FLU A&B, COVID) ARPGX2
Influenza A by PCR: NEGATIVE
Influenza B by PCR: NEGATIVE
SARS Coronavirus 2 by RT PCR: POSITIVE — AB

## 2020-08-06 LAB — CBC WITH DIFFERENTIAL/PLATELET
Abs Immature Granulocytes: 0.09 10*3/uL — ABNORMAL HIGH (ref 0.00–0.07)
Basophils Absolute: 0 10*3/uL (ref 0.0–0.1)
Basophils Relative: 0 %
Eosinophils Absolute: 0 10*3/uL (ref 0.0–0.5)
Eosinophils Relative: 0 %
HCT: 15 % — ABNORMAL LOW (ref 36.0–46.0)
Hemoglobin: 4 g/dL — CL (ref 12.0–15.0)
Immature Granulocytes: 1 %
Lymphocytes Relative: 15 %
Lymphs Abs: 1.5 10*3/uL (ref 0.7–4.0)
MCH: 16.7 pg — ABNORMAL LOW (ref 26.0–34.0)
MCHC: 26.7 g/dL — ABNORMAL LOW (ref 30.0–36.0)
MCV: 62.8 fL — ABNORMAL LOW (ref 80.0–100.0)
Monocytes Absolute: 0.7 10*3/uL (ref 0.1–1.0)
Monocytes Relative: 7 %
Neutro Abs: 7.6 10*3/uL (ref 1.7–7.7)
Neutrophils Relative %: 77 %
Platelets: 712 10*3/uL — ABNORMAL HIGH (ref 150–400)
RBC: 2.39 MIL/uL — ABNORMAL LOW (ref 3.87–5.11)
RDW: 22.3 % — ABNORMAL HIGH (ref 11.5–15.5)
WBC: 9.9 10*3/uL (ref 4.0–10.5)
nRBC: 1.7 % — ABNORMAL HIGH (ref 0.0–0.2)

## 2020-08-06 LAB — COMPREHENSIVE METABOLIC PANEL
ALT: 6 U/L (ref 0–44)
AST: 8 U/L — ABNORMAL LOW (ref 15–41)
Albumin: 2.9 g/dL — ABNORMAL LOW (ref 3.5–5.0)
Alkaline Phosphatase: 82 U/L (ref 38–126)
Anion gap: 11 (ref 5–15)
BUN: 52 mg/dL — ABNORMAL HIGH (ref 8–23)
CO2: 10 mmol/L — ABNORMAL LOW (ref 22–32)
Calcium: 8.4 mg/dL — ABNORMAL LOW (ref 8.9–10.3)
Chloride: 106 mmol/L (ref 98–111)
Creatinine, Ser: 1.04 mg/dL — ABNORMAL HIGH (ref 0.44–1.00)
GFR, Estimated: 60 mL/min — ABNORMAL LOW (ref >60)
Glucose, Bld: 123 mg/dL — ABNORMAL HIGH (ref 70–99)
Potassium: 4.5 mmol/L (ref 3.5–5.1)
Sodium: 127 mmol/L — ABNORMAL LOW (ref 135–145)
Total Bilirubin: 0.8 mg/dL (ref 0.3–1.2)
Total Protein: 7.8 g/dL (ref 6.5–8.1)

## 2020-08-06 LAB — FIBRINOGEN: Fibrinogen: 670 mg/dL — ABNORMAL HIGH (ref 210–475)

## 2020-08-06 LAB — GLUCOSE, CAPILLARY: Glucose-Capillary: 98 mg/dL (ref 70–99)

## 2020-08-06 LAB — CBG MONITORING, ED: Glucose-Capillary: 121 mg/dL — ABNORMAL HIGH (ref 70–99)

## 2020-08-06 LAB — D-DIMER, QUANTITATIVE: D-Dimer, Quant: 1.93 ug/mL-FEU — ABNORMAL HIGH (ref 0.00–0.50)

## 2020-08-06 LAB — LACTATE DEHYDROGENASE: LDH: 86 U/L — ABNORMAL LOW (ref 98–192)

## 2020-08-06 LAB — PROCALCITONIN: Procalcitonin: 3.51 ng/mL

## 2020-08-06 LAB — TRIGLYCERIDES: Triglycerides: 308 mg/dL — ABNORMAL HIGH (ref <150)

## 2020-08-06 LAB — FERRITIN: Ferritin: 41 ng/mL (ref 11–307)

## 2020-08-06 LAB — PREPARE RBC (CROSSMATCH)

## 2020-08-06 LAB — MRSA PCR SCREENING: MRSA by PCR: NEGATIVE

## 2020-08-06 MED ORDER — SODIUM CHLORIDE 0.9 % IV SOLN
10.0000 mL/h | Freq: Once | INTRAVENOUS | Status: AC
  Administered 2020-08-06: 10 mL/h via INTRAVENOUS

## 2020-08-06 MED ORDER — SODIUM CHLORIDE 0.9 % IV BOLUS
1000.0000 mL | Freq: Once | INTRAVENOUS | Status: AC
  Administered 2020-08-06: 1000 mL via INTRAVENOUS

## 2020-08-06 MED ORDER — PANTOPRAZOLE SODIUM 40 MG IV SOLR
40.0000 mg | Freq: Two times a day (BID) | INTRAVENOUS | Status: DC
  Administered 2020-08-07 – 2020-08-10 (×7): 40 mg via INTRAVENOUS
  Filled 2020-08-06 (×7): qty 40

## 2020-08-06 MED ORDER — POLYETHYLENE GLYCOL 3350 17 G PO PACK: 17.0000 g | PACK | Freq: Every day | ORAL | Status: DC | PRN

## 2020-08-06 MED ORDER — PANTOPRAZOLE SODIUM 40 MG IV SOLR
40.0000 mg | Freq: Once | INTRAVENOUS | Status: AC
  Administered 2020-08-06: 40 mg via INTRAVENOUS
  Filled 2020-08-06: qty 40

## 2020-08-06 MED ORDER — DOCUSATE SODIUM 100 MG PO CAPS: 100.0000 mg | ORAL_CAPSULE | Freq: Two times a day (BID) | ORAL | Status: DC | PRN

## 2020-08-06 NOTE — ED Triage Notes (Signed)
Pt came from home via EMS. Pt tested covid positive last week. Pt's family called EMS because they were concerned pt was not clearing mucus when coughing. PMH of MS; pt bedbound d/t MS.

## 2020-08-06 NOTE — ED Notes (Signed)
Py. CBG 121, RN, Clarise Cruz made aware.

## 2020-08-06 NOTE — ED Provider Notes (Signed)
[facility] DEPT Provider Note   CSN: [id number] Arrival date & time: 08/06/20  1219     History Chief Complaint  Patient presents with  . Covid Positive  . Cough    Jocelyn Sanchez is a 65 y.o. female.  65 year old female with history of MS, bed bound with sacral decubitus ulcer, contractures, on Eliquis for A. fib, history of diabetes brought in by EMS from home for difficulty coughing. Patient reports COVID + home test 4 days ago, has a very hard time coughing. Denies fevers, shortness of breath, reports normal PO intake and normal bowel and bladder habits. Patient had 2 COVID vaccines, has not had a booster.   [name] was evaluated in Emergency Department on 08/06/2020 for the symptoms described in the history of present illness. She was evaluated in the context of the global COVID-19 pandemic, which necessitated consideration that the patient might be at risk for infection with the SARS-CoV-2 virus that causes COVID-19. Institutional protocols and algorithms that pertain to the evaluation of patients at risk for COVID-19 are in a state of rapid change based on information released by regulatory bodies including the CDC and federal and state organizations. These policies and algorithms were followed during the patient's care in the ED.         Past Medical History:  Diagnosis Date  . Anemia   . DM II (diabetes mellitus, type II), controlled ([place])   . Gait disorder   . HTN (hypertension)   . Lymphedema   . MS (multiple sclerosis) ([place])   . Pulmonary embolism [facility])     Patient Active Problem List   Diagnosis Date Noted  . COVID 08/06/2020  . Sepsis ([place]) 07/26/2019  . Metabolic acidosis   . Diabetic acidosis without coma ([place])   . GI bleed 02/10/2019  . Proteus mirabilis infection 08/14/2017  . Diarrhea 08/14/2017  . Bacteremia 08/14/2017  . Gastric and duodenal angiodysplasia   . Esophageal stricture   . Gastroesophageal  reflux disease with esophagitis   . Hx of pulmonary embolus 10/26/2015  . PVD (peripheral vascular disease) ([place]) 07/04/2015  . Essential hypertension, benign 06/17/2015  . Decubitus ulcer of ankle, stage 4 ([place]) 05/10/2015  . Fever 03/08/2015  . Septic shock ([place]) 03/06/2015  . Diabetes mellitus with peripheral vascular disease ([place]) 03/06/2015  . Leukocytosis 03/06/2015  . Anemia of chronic disease 03/06/2015  . Pressure injury of sacral region, stage 3 ([place]) 03/06/2015  . Lymphedema 03/06/2015  . Sepsis with acute renal failure ([place]) 03/01/2015  . Urinary tract infection with hematuria 11/28/2014  . Multiple sclerosis ([place]) 05/11/2011    Past Surgical History:  Procedure Laterality Date  . ESOPHAGOGASTRODUODENOSCOPY N/A 07/12/2016   Procedure: ESOPHAGOGASTRODUODENOSCOPY (EGD);  Surgeon: Irene Shipper, MD;  Location: [facility];  Service: Endoscopy;  Laterality: N/A;  . ESOPHAGOGASTRODUODENOSCOPY N/A 02/11/2019   Procedure: ESOPHAGOGASTRODUODENOSCOPY (EGD);  Surgeon: Carol Ada, MD;  Location: [facility];  Service: Endoscopy;  Laterality: N/A;  . FLEXIBLE SIGMOIDOSCOPY Left 03/09/2015   Procedure: FLEXIBLE SIGMOIDOSCOPY;  Surgeon: Carol Ada, MD;  Location: [facility];  Service: Endoscopy;  Laterality: Left;  . HOT HEMOSTASIS N/A 02/11/2019   Procedure: HOT HEMOSTASIS (ARGON PLASMA COAGULATION/BICAP);  Surgeon: Carol Ada, MD;  Location: [facility];  Service: Endoscopy;  Laterality: N/A;     OB History    Gravida  3   Para  3   Term  0   Preterm  0   AB  0  Living  0     SAB  0   IAB  0   Ectopic  0   Multiple  0   Live Births              Family History  Problem Relation Age of Onset  . Multiple sclerosis Mother     Social History   Tobacco Use  . Smoking status: Former Smoker    Types: Cigarettes  . Smokeless tobacco: Never Used  Substance Use Topics  . Alcohol use: No  . Drug use: No    Home Medications Prior to Admission  medications   Medication Sig Start Date End Date Taking? Authorizing Provider  acetaminophen (TYLENOL) 500 MG tablet Take 1,000 mg by mouth every 6 (six) hours as needed for moderate pain.    [provider]  carvedilol (COREG) 6.25 MG tablet Take 6.25 mg by mouth 2 (two) times daily. 12/21/18   [provider]  ELIQUIS 5 MG TABS tablet Take 5 mg by mouth 2 (two) times daily. 03/11/19   [provider]  ENTRESTO 24-26 MG Take 1 tablet by mouth 2 (two) times daily. 03/13/19   [provider]  famotidine (PEPCID) 20 MG tablet Take 1 tablet (20 mg total) by mouth 2 (two) times daily. 08/16/17 07/26/19  Arrien, Jimmy Picket, MD  FARXIGA 5 MG TABS tablet Take 5 mg by mouth daily. 06/13/19   [provider]  metFORMIN (GLUCOPHAGE) 500 MG tablet Take 500 mg by mouth 2 (two) times daily. 07/25/19   [provider]  ondansetron (ZOFRAN) 4 MG tablet Take 4 mg by mouth every 6 (six) hours as needed for nausea. 07/16/19   [provider]  traZODone (DESYREL) 50 MG tablet Take 50 mg by mouth at bedtime. 06/27/19   [provider]    Allergies    Sulfa antibiotics  Review of Systems   Review of Systems  Constitutional: Negative for fever.  Respiratory: Positive for cough. Negative for shortness of breath.   Cardiovascular: Negative for chest pain.  Gastrointestinal: Negative for abdominal pain, constipation, diarrhea, nausea and vomiting.  Genitourinary: Negative for difficulty urinating and dysuria.  Musculoskeletal: Negative for arthralgias and myalgias.  Skin:       Chronic wounds/decubitus ulcers  Allergic/Immunologic: Positive for immunocompromised state.  Psychiatric/Behavioral: Negative for confusion.  All other systems reviewed and are negative.   Physical Exam Updated Vital Signs BP (!) 88/56   Pulse 79   Temp 98.3 F (36.8 C) (Rectal)   Resp 16   Ht 5\' 3"  (1.6 m)   Wt 65 kg   SpO2 94%   BMI 25.38 kg/m   Physical  Exam Vitals and nursing note reviewed.  Constitutional:      General: She is not in acute distress.    Appearance: She is well-developed and well-nourished. She is not diaphoretic.     Comments: Chronically ill appearing  HENT:     Head: Normocephalic and atraumatic.     Mouth/Throat:     Mouth: Mucous membranes are moist.  Eyes:     Conjunctiva/sclera: Conjunctivae normal.  Cardiovascular:     Rate and Rhythm: Normal rate and regular rhythm.     Pulses: Normal pulses.     Heart sounds: Normal heart sounds.  Pulmonary:     Effort: Pulmonary effort is normal.     Comments: Weak cough Abdominal:     Palpations: Abdomen is soft.     Tenderness: There is no abdominal tenderness.  Musculoskeletal:     Right lower leg: No edema.     Left lower leg: No edema.  Skin:    General: Skin is warm and dry.     Findings: No erythema or rash.  Neurological:     Mental Status: She is alert and oriented to person, place, and time.  Psychiatric:        Mood and Affect: Mood and affect normal.        Behavior: Behavior normal.     ED Results / Procedures / Treatments   Labs (all labs ordered are listed, but only abnormal results are displayed) Labs Reviewed  CBC WITH DIFFERENTIAL/PLATELET - Abnormal; Notable for the following components:      Result Value   RBC 2.39 (*)    Hemoglobin 4.0 (*)    HCT 15.0 (*)    MCV 62.8 (*)    MCH 16.7 (*)    MCHC 26.7 (*)    RDW 22.3 (*)    Platelets 712 (*)    nRBC 1.7 (*)    All other components within normal limits  COMPREHENSIVE METABOLIC PANEL - Abnormal; Notable for the following components:   Sodium 127 (*)    CO2 10 (*)    Glucose, Bld 123 (*)    BUN 52 (*)    Creatinine, Ser 1.04 (*)    Calcium 8.4 (*)    Albumin 2.9 (*)    AST 8 (*)    GFR, Estimated 60 (*)    All other components within normal limits  D-DIMER, QUANTITATIVE - Abnormal; Notable for the following components:   D-Dimer, Quant 1.93 (*)    All other components within  normal limits  LACTATE DEHYDROGENASE - Abnormal; Notable for the following components:   LDH 86 (*)    All other components within normal limits  TRIGLYCERIDES - Abnormal; Notable for the following components:   Triglycerides 308 (*)    All other components within normal limits  FIBRINOGEN - Abnormal; Notable for the following components:   Fibrinogen 670 (*)    All other components within normal limits  C-REACTIVE PROTEIN - Abnormal; Notable for the following components:   CRP 22.3 (*)    All other components within normal limits  RESP PANEL BY RT-PCR (FLU A&B, COVID) ARPGX2  CULTURE, BLOOD (ROUTINE X 2)  CULTURE, BLOOD (ROUTINE X 2)  URINE CULTURE  LACTIC ACID, PLASMA  PROCALCITONIN  FERRITIN  URINALYSIS, ROUTINE W REFLEX MICROSCOPIC  POC OCCULT BLOOD, ED  TYPE AND SCREEN  PREPARE RBC (CROSSMATCH)    EKG EKG Interpretation  Date/Time:  Friday August 06 2020 12:43:11 EST Ventricular Rate:  77 PR Interval:    QRS Duration: 87 QT Interval:  365 QTC Calculation: 413 R Axis:   -8 Text Interpretation: Sinus rhythm Abnormal R-wave progression, early transition Confirmed by Lacretia Leigh ([id number]) on 08/06/2020 2:16:08 PM   Radiology DG Chest Port 1 View  Result Date: 08/06/2020 CLINICAL DATA:  COVID positive in a 65 year old female. EXAM: PORTABLE CHEST 1 VIEW COMPARISON:  July 27, 2019 FINDINGS: Image rotated to the LEFT. Accounting for this cardiomediastinal contours and hilar structures are stable. Lungs are clear.  No sign of pleural effusion. On limited assessment no acute skeletal process with glenohumeral degenerative changes bilaterally. IMPRESSION: No active cardiopulmonary disease. Electronically Signed   By: Zetta Bills M.D.   On: 08/06/2020 13:36    Procedures .Critical Care Performed by: Tacy Learn, PA-C Authorized by: Tacy Learn, PA-C  Critical care provider statement:    Critical care time (minutes):  45   Critical care was time spent  personally by me on the following activities:  Discussions with consultants, evaluation of patient's response to treatment, examination of patient, ordering and performing treatments and interventions, ordering and review of laboratory studies, ordering and review of radiographic studies, pulse oximetry, re-evaluation of patient's condition, obtaining history from patient or surrogate and review of old charts     Medications Ordered in ED Medications  0.9 %  sodium chloride infusion (has no administration in time range)  sodium chloride 0.9 % bolus 1,000 mL (0 mLs Intravenous Stopped 08/06/20 1509)  sodium chloride 0.9 % bolus 1,000 mL (1,000 mLs Intravenous New Bag/Given 08/06/20 1508)    ED Course  I have reviewed the triage vital signs and the nursing notes.  Pertinent labs & imaging results that were available during my care of the patient were reviewed by me and considered in my medical decision making (see chart for details).  Clinical Course as of 08/06/20 1532  Fri Aug 06, 4053  43 65 year old female with complaint of difficulty coughing, diagnosed with COVID on home test 4 days ago. On exam, patient is chronically ill appearing, contractures of extremities, decubitus ulcer to sacrum and right elbow. Patient is able to answer questions appropriately, has a weak cough throughout exam, course lung sounds throughout.  [LM]  [place] order set initiated, patient is hypotensive, given IV bolus with NS. O2 sats 100% on room air.  [LM]  1405 Hemoglobin is 4.0. Stool is soft and brown, Hemoccult pending. Patient consents to blood transfusion, 2 units packed red blood cells ordered. History of GI bleed, last seen by Dr. Loletha Carrow with [facility]. Last hgb on file of 11.7 on 2//2021 [LM]  1509 Occult negative.  Discussed with Dr. Zenia Resides, ER attending who has seen the patient, critical care paged for consult. [LM]  1531 Care signed out to attending provider pending consult for admission. [LM]     Clinical Course User Index [LM] Roque Lias   MDM Rules/Calculators/A&P                          Final Clinical Impression(s) / ED Diagnoses Final diagnoses:  Anemia, unspecified type  COVID  Hyponatremia    Rx / DC Orders ED Discharge Orders    None       Roque Lias 08/06/20 1532    Lacretia Leigh, MD 08/10/20 1009

## 2020-08-06 NOTE — Progress Notes (Signed)
Pt refused ABG at this time. Pt on room air sats 99% no distress noted.

## 2020-08-06 NOTE — Progress Notes (Signed)
VAST consulted to obtain IV access. Upon arrival at bedside, PA had just placed [place] IV access.

## 2020-08-06 NOTE — ED Notes (Signed)
Lab called to notify that pt is Covid +, RN & MD made aware

## 2020-08-06 NOTE — ED Provider Notes (Signed)
  Ultrasound ED Peripheral IV (Provider)  Date/Time: 08/06/2020 1:35 PM Performed by: Lorayne Bender, PA-C Authorized by: Lorayne Bender, PA-C   Procedure details:    Indications: hypotension, multiple failed IV attempts and poor IV access     Skin Prep: chlorhexidine gluconate     Location:  Right AC   Angiocath:  20 G   Bedside Ultrasound Guided: Yes     Images: not archived     Patient tolerated procedure without complications: Yes     Dressing applied: Yes   Comments:     Positive flash.  Advanced under ultrasound guidance without pain, swelling, or other signs of infiltration. RN needed to obtain blood cultures and blood for other labs, therefore, after IV access was obtained, the management of the IV was handed over to the RN.  Positive blood flow into collection tubes was observed.  Responsibility for assuring continued patency and securing the IV was handed over to RN Wilber Oliphant.      Lorayne Bender, PA-C 08/06/20 1343    Lacretia Leigh, MD 08/10/20 1009

## 2020-08-06 NOTE — Consult Note (Signed)
Referring Provider: ED Primary Care Physician:  Dixie Dials, MD Primary Gastroenterologist:  Althia Forts  Reason for Consultation:  Anemia  HPI: Jocelyn Sanchez is a 65 y.o. female with history of A fib (on Eliquis), multiple sclerosis, and stercoral colitis presenting for consultation of anemia.  Chart reviewed.  Patient presented due to cough over the last several days and was found to be Covid-19 positive.  Hgb 4.0 per initial labs.  Per ED provider note, she denies changes in bowel habits.  Per ED provider note, stool brown, heme-occult negative.  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 Two were in the duodenal bulb and two were in the third portion of the duodenum.3cm HH, benign appearing esophageal stenosis  EGD 06/2016: GERD with mild esophagitis an incidental esophageal stricture. Incidental diminutive AVM of the duodenal bulb  Flex sig 02/2015: normal   Lab Results: Recent Labs    08/06/20 1308  WBC 9.9  HGB 4.0*  HCT 15.0*  PLT 712*   BMET Recent Labs    08/06/20 1308  NA 127*  K 4.5  CL 106  CO2 10*  GLUCOSE 123*  BUN 52*  CREATININE 1.04*  CALCIUM 8.4*   LFT Recent Labs    08/06/20 1308  PROT 7.8  ALBUMIN 2.9*  AST 8*  ALT 6  ALKPHOS 82  BILITOT 0.8   PT/INR No results for input(s): LABPROT, INR in the last 72 hours.   Studies/Results: DG Chest Port 1 View  Result Date: 08/06/2020 CLINICAL DATA:  COVID positive in a 65 year old female. EXAM: PORTABLE CHEST 1 VIEW COMPARISON:  July 27, 2019 FINDINGS: Image rotated to the LEFT. Accounting for this cardiomediastinal contours and hilar structures are stable. Lungs are clear.  No sign of pleural effusion. On limited assessment no acute skeletal process with glenohumeral degenerative changes bilaterally. IMPRESSION: No active cardiopulmonary disease. Electronically Signed   By: Zetta Bills M.D.   On: 08/06/2020 13:36    Impression: Severe anemia, heme-negative and brown  stool.  Likely multifactorial.  History of non-bleeding duodenal AVMs.  It is possible she's had ongoing, slow GI blood loss from AVMs. -Hgb 4.0 today as compared to 11.7 in 07/2019 -Surprisingly normal ferritin (41) -BUN elevated to 52, Cr 1.04, concerning for upper GI bleeding.    A fib, on Eliquis  COVID-19 infection  Multiple Sclerosis  Plan: Continue supportive care and IV Protonix.  Continue to hold Eliquis.  Continue to trend CBC with transfusion as needed to maintain Hgb ~7.  Consider EGD when patient is more stable (Hgb >6.8) and stable from a COVID standpoint.  If destabilizing bleeding occurs, she may need emergent EGD (which would likely be done at the bedside with patient intubation).   LOS: 0 days   Salley Slaughter  PA-C 08/06/2020, 4:34 PM  Contact #  [phone]

## 2020-08-06 NOTE — Progress Notes (Signed)
[facility] Progress Note [name] DOB: 07-24-1955 MRN: [id number]   Date of Service  08/06/2020  HPI/Events of Note  [place], bed bound patient with multiple sclerosis, recently Covid 100 positive, history of PE on Eliquis, admitted to [facility] tonight due to acute GI bleeding, hemorrhagic shock, and acute blood loss anemia with hemoglobin as low as 4 gm / dl in the ED. Blood transfusion in progress.  eICU Interventions  New Patient Evaluation completed        Frederik Pear 08/06/2020, 10:41 PM

## 2020-08-06 NOTE — ED Provider Notes (Signed)
I provided a substantive portion of the care of this patient.  I personally performed the entirety of the medical decision making for this encounter.    65 year old female presents with increased weakness shortness of breath. Worsening cough. Diagnosed with home Covid test that was positive. Chest x-ray without acute findings. Patient hypotensive here and required IV fluids. Blood work pending at this time. Will likely require admission   Lacretia Leigh, MD 08/06/20 1416

## 2020-08-06 NOTE — ED Provider Notes (Signed)
Care handoff received from Suella Broad, PA-C at shift change please see previous provider note for full details of visit.  In short 65 year old female with history of MS bedbound contractures on Eliquis for A. fib.  Patient presented for difficulty coughing over the past few days home Covid test was +4 days ago.  During work-up she was found to be anemic with hemoglobin of 4.0.  Patient was consented for blood products by previous team and 2 units packed RBCs were ordered.  Patient has been hypotensive throughout stay has received 1 L IV fluid and is receiving a second liter at this time.  Hemoccult was obtained for evaluation of anemia and was negative.    Consult has been placed to critical care medicine for admission I have been asked to follow-up on this consult. Physical Exam  BP (!) 88/56   Pulse 79   Temp 98.3 F (36.8 C) (Rectal)   Resp 16   Ht 5\' 3"  (1.6 m)   Wt 65 kg   SpO2 94%   BMI 25.38 kg/m   Physical Exam Constitutional:      General: She is not in acute distress.    Appearance: She is not toxic-appearing or diaphoretic.  HENT:     Head: Normocephalic and atraumatic.  Pulmonary:     Effort: Pulmonary effort is normal. No respiratory distress.  Abdominal:     Palpations: Abdomen is soft.     Tenderness: There is no abdominal tenderness. There is no guarding or rebound.  Skin:    General: Skin is warm and dry.  Neurological:     General: No focal deficit present.     Mental Status: She is alert.     GCS: GCS eye subscore is 4. GCS verbal subscore is 5. GCS motor subscore is 6.  Psychiatric:        Mood and Affect: Mood normal.        Behavior: Behavior normal.     ED Course/Procedures   Clinical Course as of 08/06/20 1553  Fri Aug 07, 5531  5964 65 year old female with complaint of difficulty coughing, diagnosed with COVID on home test 4 days ago. On exam, patient is chronically ill appearing, contractures of extremities, decubitus ulcer to sacrum and right  elbow. Patient is able to answer questions appropriately, has a weak cough throughout exam, course lung sounds throughout.  [LM]  [place] order set initiated, patient is hypotensive, given IV bolus with NS. O2 sats 100% on room air.  [LM]  1405 Hemoglobin is 4.0. Stool is soft and brown, Hemoccult pending. Patient consents to blood transfusion, 2 units packed red blood cells ordered. History of GI bleed, last seen by Dr. Loletha Carrow with [facility]. Last hgb on file of 11.7 on 2//2021 [LM]  1509 Occult negative.  Discussed with Dr. Zenia Resides, ER attending who has seen the patient, critical care paged for consult. [LM]  1531 Care signed out to attending provider pending consult for admission. [LM]    Clinical Course User Index [LM] Tacy Learn, PA-C    Procedures  MDM  3:45 PM: Consult with critical care Dr. Loanne Drilling, patient accepted for admission, Dr. Toney Rakes is asked that I paged GI regarding this patient.  Has also advised that we obtain repeat CBC to double check hemoglobin. - 3:50 PM: Consult with gastroenterologist Dr. Tomma Lightning, advises they will see patient and that patient can start Protonix IV twice daily.  No further recommendations at this time - 4 PM: [facility]  GI touching base with [facility] for further recommendations - 4:30 PM: Patient reassessed she is resting comfortably no acute distress remains alert and oriented.  She has no questions or concerns at this time. - 5:45 PM: Patient has been seen and evaluated by CCM admitted to their service.  I reassessed the patient she is resting comfortably in bed no acute distress reports she is feeling well.  Blood pressure remains low otherwise vitals are stable.  I was asked to confirm patient's CODE STATUS by CCM, patient advises that she is DNR and that she filed paperwork during her last admission.  Chemika Nightengale Maricle was evaluated in Emergency Department on 08/06/2020 for the symptoms described in the history of present illness. She was  evaluated in the context of the global COVID-19 pandemic, which necessitated consideration that the patient might be at risk for infection with the SARS-CoV-2 virus that causes COVID-19. Institutional protocols and algorithms that pertain to the evaluation of patients at risk for COVID-19 are in a state of rapid change based on information released by regulatory bodies including the CDC and federal and state organizations. These policies and algorithms were followed during the patient's care in the ED.  Note: Portions of this report may have been transcribed using voice recognition software. Every effort was made to ensure accuracy; however, inadvertent computerized transcription errors may still be present.   Deliah Boston, PA-C 08/06/20 1746    Lennice Sites, DO 08/06/20 2333

## 2020-08-06 NOTE — H&P (Signed)
NAME:  JOBINA MAITA, MRN:  [id number], DOB:  [birth year], LOS: 0 ADMISSION DATE:  08/06/2020, CONSULTATION DATE:  08/06/20 REFERRING MD:  Arlean Hopping, PA CHIEF COMPLAINT:  Hypotension, acute blood loss anemia  Brief History:  65 year old female with MS and hx of prior GIB, hx gastric duodenal AVMs admitted for profound acute blood loss anemia secondary to suspected GI bleed.  History of Present Illness:  Ms. Girtrude Enslin is a 65 year old bedbound female with MS, chronic systolic heart failure, hx PE on Eliquis, gastric and duodenal AVM, hx GI bleed who presents with cough and weakness. She reports recent home exposure to an individual with COVID and tested positive on a home test four days prior to admission. Denies fevers, shortness of breath, wheezing. Denies abdominal pain, nausea, emesis or change in bowel habits.  In the ED she became hypotensive with SBP to the 70s and given IVF bolus with response. 2nd bolus given. Labs showed profound Hg drop to 4.0 which was confirmed on repeat. Labs also significant for AKI with elevated BUN and metabolic acidosis with CO2 10 PCCM and GI consulted.   Past Medical History:  MS, chronic systolic heart failure, hx PE on Eliquis, gastric and duodenal AVM, hx GI bleed  Significant Hospital Events:  2/18 Admit to ICU for Hg 4 and hypotension  Consults:  PCCM GI  Procedures:    Significant Diagnostic Tests:    Micro Data:  SARS-COVID 2/18 - POSITIVE BCx 2/18 UCx 2/18  Antimicrobials:    Interim History / Subjective:    Objective   Blood pressure (!) 84/54, pulse 80, temperature (!) 94.2 F (34.6 C), temperature source Oral, resp. rate 20, height 5\' 3"  (1.6 m), weight 65 kg, SpO2 99 %.       No intake or output data in the 24 hours ending 08/06/20 1749 Filed Weights   08/06/20 1237  Weight: 65 kg    Physical Exam: General: Chronically ill-appearing, pleasant female, no acute distress, upper extremity contractures HENT: [place], AT,  OP clear, MMM Eyes: EOMI, no scleral icterus, pale conjunctiva Respiratory: Clear to auscultation bilaterally.  No crackles, wheezing or rales Cardiovascular: RRR, -M/R/G, no JVD GI: BS+, soft, nontender Extremities:Muscle wasting, -edema,-tenderness Neuro: AAO x4, CNII-XII grossly intact Psych: Normal mood, normal affect  Resolved Hospital Problem list     Assessment & Plan:  Acute blood loss anemia Admitted for Hg 4, confirmed on repeat. Hx of GIB secondary to gastric and duodenal AVMs. No report or evidence of hematemesis, hematochezia or melena. Patient denies being on Eliquis however Pharmacy reports 60 day prescription was filled on 06/08/20. --Trend CBC q6h --Transfuse PRBC x 2 --PPI BID --Appreciate GI input. Plan for evaluation when stable with capsule endoscopy vs endoscopy  Hemorrhagic shock vs Hypovolemic hypotension. Could consider septic shock secondary to COVID-19 however less likely. --Fluid responsive. S/p NS --PRBC x 2 ordered --Follow-up cultures --Hold on antibiotics for now due to low suspicion for bacterial infection --Hold home anti-hypertensive agents  History of PE --Hold home Eliquis  AKI with metabolic acidosis --Likely pre-renal in setting of volume loss --Obtain ABG. If pH < 7.2, will start sodium bicarbonate gtt --Monitor UOP/Cr  COVID-19 positive Mild symptoms. Recently exposed to contact in household. SpO2 100% on room air. No infiltrates on admission CXR. --No indication for steroids  Chronic systolic heart failure --Hold home carvedilol in setting of hypotension  DM2 --CBG q4h  Multiple sclerosis with contractures Decubitus ulcer --Will order wound care  Best practice (evaluated daily)  Diet: NPO. Can advance to clear liquids if Hg stable Pain/Anxiety/Delirium protocol (if indicated): N/A VAP protocol (if indicated): N/A DVT prophylaxis: SCDs GI prophylaxis: PPI BID Glucose control: CBG q4h Mobility: As tolerate Disposition:  Admit to ICU  Goals of Care:  Last date of multidisciplinary goals of care discussion:08/06/20 Family and staff present: Patient and PCCM (Dr. Loanne Drilling) Summary of discussion: Confirmed DNR. She has completed advanced directives prior to hospitalization. Follow up goals of care discussion due: 08/13/20 Code Status: DNR  Labs   CBC: Recent Labs  Lab 08/06/20 1308 08/06/20 1637  WBC 9.9 9.7  NEUTROABS 7.6  --   HGB 4.0* 4.1*  HCT 15.0* 15.9*  MCV 62.8* 63.6*  PLT 712* 710*    Basic Metabolic Panel: Recent Labs  Lab 08/06/20 1308  NA 127*  K 4.5  CL 106  CO2 10*  GLUCOSE 123*  BUN 52*  CREATININE 1.04*  CALCIUM 8.4*   GFR: Estimated Creatinine Clearance: 48.9 mL/min (A) (by C-G formula based on SCr of 1.04 mg/dL (H)). Recent Labs  Lab 08/06/20 1308 08/06/20 1637  PROCALCITON 3.51  --   WBC 9.9 9.7  LATICACIDVEN 1.0  --     Liver Function Tests: Recent Labs  Lab 08/06/20 1308  AST 8*  ALT 6  ALKPHOS 82  BILITOT 0.8  PROT 7.8  ALBUMIN 2.9*   No results for input(s): LIPASE, AMYLASE in the last 168 hours. No results for input(s): AMMONIA in the last 168 hours.  ABG    Component Value Date/Time   HCO3 16.8 (L) 08/07/2017 2305   TCO2 18 (L) 08/07/2017 2310   ACIDBASEDEF 7.4 (H) 08/07/2017 2305   O2SAT 45.1 08/07/2017 2305     Coagulation Profile: No results for input(s): INR, PROTIME in the last 168 hours.  Cardiac Enzymes: No results for input(s): CKTOTAL, CKMB, CKMBINDEX, TROPONINI in the last 168 hours.  HbA1C: Hgb A1c MFr Bld  Date/Time Value Ref Range Status  07/27/2019 07:05 AM 6.7 (H) 4.8 - 5.6 % Final    Comment:    (NOTE)         Prediabetes: 5.7 - 6.4         Diabetes: >6.4         Glycemic control for adults with diabetes: <7.0   02/11/2019 02:03 AM 5.9 (H) 4.8 - 5.6 % Final    Comment:    (NOTE) Pre diabetes:          5.7%-6.4% Diabetes:              >6.4% Glycemic control for   <7.0% adults with diabetes     CBG: No  results for input(s): GLUCAP in the last 168 hours.  Review of Systems:    Review of Systems  Constitutional: Positive for malaise/fatigue. Negative for chills, diaphoresis, fever and weight loss.  HENT: Positive for congestion. Negative for ear pain and sore throat.   Respiratory: Positive for cough. Negative for hemoptysis, sputum production, shortness of breath and wheezing.   Cardiovascular: Negative for chest pain, palpitations and leg swelling.  Gastrointestinal: Negative for abdominal pain, blood in stool, constipation, diarrhea, heartburn, melena, nausea and vomiting.  Genitourinary: Negative for frequency.  Musculoskeletal: Negative for joint pain and myalgias.  Skin: Negative for itching and rash.  Neurological: Negative for dizziness, weakness and headaches.  Endo/Heme/Allergies: Does not bruise/bleed easily.  Psychiatric/Behavioral: Negative for depression. The patient is not nervous/anxious.    Past Medical History:  She,  has a past medical history of Anemia, DM II (diabetes mellitus, type II), controlled ([place]), Gait disorder, HTN (hypertension), Lymphedema, MS (multiple sclerosis) ([place]), and Pulmonary embolism ([place]).   Surgical History:   Past Surgical History:  Procedure Laterality Date  . ESOPHAGOGASTRODUODENOSCOPY N/A 07/12/2016   Procedure: ESOPHAGOGASTRODUODENOSCOPY (EGD);  Surgeon: Irene Shipper, MD;  Location: [facility];  Service: Endoscopy;  Laterality: N/A;  . ESOPHAGOGASTRODUODENOSCOPY N/A 02/11/2019   Procedure: ESOPHAGOGASTRODUODENOSCOPY (EGD);  Surgeon: Carol Ada, MD;  Location: [facility];  Service: Endoscopy;  Laterality: N/A;  . FLEXIBLE SIGMOIDOSCOPY Left 03/09/2015   Procedure: FLEXIBLE SIGMOIDOSCOPY;  Surgeon: Carol Ada, MD;  Location: [facility]OSCOPY;  Service: Endoscopy;  Laterality: Left;  . HOT HEMOSTASIS N/A 02/11/2019   Procedure: HOT HEMOSTASIS (ARGON PLASMA COAGULATION/BICAP);  Surgeon: Carol Ada, MD;  Location: [facility];  Service:  Endoscopy;  Laterality: N/A;     Social History:   reports that she has quit smoking. Her smoking use included cigarettes. She has never used smokeless tobacco. She reports that she does not drink alcohol and does not use drugs.   Family History:  Her family history includes Multiple sclerosis in her mother.   Allergies Allergies  Allergen Reactions  . Sulfa Antibiotics Shortness Of Breath and Swelling     Home Medications  Prior to Admission medications   Medication Sig Start Date End Date Taking? Authorizing Provider  acetaminophen (TYLENOL) 500 MG tablet Take 1,000 mg by mouth 2 (two) times daily.   Yes [provider]  carvedilol (COREG) 6.25 MG tablet Take 6.25 mg by mouth 2 (two) times daily. 12/21/18  Yes [provider]  ELIQUIS 5 MG TABS tablet Take 5 mg by mouth 2 (two) times daily. 03/11/19  Yes [provider]  ENTRESTO 24-26 MG Take 1 tablet by mouth 2 (two) times daily. 03/13/19  Yes [provider]  guaifenesin (ROBITUSSIN) 100 MG/5ML syrup Take 200 mg by mouth 3 (three) times daily as needed for cough.   Yes [provider]  metFORMIN (GLUCOPHAGE) 500 MG tablet Take 500 mg by mouth 2 (two) times daily. 07/25/19  Yes [provider]  methocarbamol (ROBAXIN) 500 MG tablet Take 750 mg by mouth 2 (two) times daily. 06/27/20  Yes [provider]  omeprazole (PRILOSEC) 20 MG capsule Take 20 mg by mouth daily. 07/18/20  Yes [provider]  traZODone (DESYREL) 100 MG tablet Take 100 mg by mouth at bedtime. 06/04/20  Yes [provider]  Ascorbic Acid (VITAMIN C) 1000 MG tablet Take 1,000 mg by mouth daily. 08/02/20   [provider]  [employer] D3 50 MCG (2000 UT) CAPS Take 2,000 Units by mouth daily. 08/02/20   [provider]  dexamethasone (DECADRON) 6 MG tablet Take 6 mg by mouth daily. 10 day supply 08/02/20   [provider]  lactulose (CHRONULAC) 10 GM/15ML solution Take 30 mLs by  mouth daily as needed for constipation. 07/17/20   [provider]  Zinc Sulfate 220 (50 Zn) MG TABS Take 220 mg by mouth daily. 08/02/20   [provider]     Critical care time: 35 min    The patient is critically ill with multiple organ systems failure and requires high complexity decision making for assessment and support, frequent evaluation and titration of therapies, application of advanced monitoring technologies and extensive interpretation of multiple databases.   Rodman Pickle, M.D. [facility] Pulmonary/Critical Care Medicine 08/06/2020 5:49 PM   Please see Amion for pager number to reach on-call  Pulmonary and Critical Care Team.

## 2020-08-06 NOTE — Progress Notes (Signed)
VAST consulted to obtain 2nd IV access as pt will be receiving blood. Currently, only NS bolus is ordered. Sent SecureChat to ER RN that current IV could be used for blood transfusion as it was likely for bolus to be finished by time blood is prepared.  ER RN with understanding that 2 IV's are needed for blood transfusion in case of reaction. Educated that if reaction occurs, blood is stopped completely and NS is run through same IV.  Per policy, 2 IV"s are only needed when a massive transfusion is given to increase blood volume in a shorter period of time.   Further advised that if 2nd IV still needed, VAST RN would respond to assess patient's vasculature but that it would be awhile.  Advised nurse if labs needed emergently, to please contact phlebotomy to come and otain. Vicente Males, RN verbalized understanding.

## 2020-08-06 NOTE — ED Notes (Signed)
Mariel Aloe (sister) updated on patient's care with permission from patient.

## 2020-08-06 NOTE — ED Notes (Signed)
Patient, Jocelyn Sanchez, gave verbal consent for blood administration. Mendel Ryder, RN witnessed consent and signed in Epic

## 2020-08-06 NOTE — Progress Notes (Signed)
Report received from ER-RN; I asked RN to finish 1st blood transfusion prior to transfer : hemodynamically stable with Hgb 4.0, BP 82/54

## 2020-08-06 NOTE — ED Notes (Signed)
bairhugger applied to pt on high

## 2020-08-06 NOTE — ED Notes (Signed)
Attempted to call report, nurse unavailable at this time.

## 2020-08-06 NOTE — ED Notes (Signed)
Patient unable to physically sign consent due to contracted joints from [place] of [place]

## 2020-08-07 DIAGNOSIS — D62 Acute posthemorrhagic anemia: Secondary | ICD-10-CM | POA: Diagnosis not present

## 2020-08-07 DIAGNOSIS — E872 Acidosis: Secondary | ICD-10-CM | POA: Diagnosis not present

## 2020-08-07 LAB — BASIC METABOLIC PANEL
Anion gap: 13 (ref 5–15)
Anion gap: 10 (ref 5–15)
BUN: 41 mg/dL — ABNORMAL HIGH (ref 8–23)
BUN: 31 mg/dL — ABNORMAL HIGH (ref 8–23)
CO2: 8 mmol/L — ABNORMAL LOW (ref 22–32)
CO2: 13 mmol/L — ABNORMAL LOW (ref 22–32)
Calcium: 8.1 mg/dL — ABNORMAL LOW (ref 8.9–10.3)
Calcium: 8 mg/dL — ABNORMAL LOW (ref 8.9–10.3)
Chloride: 113 mmol/L — ABNORMAL HIGH (ref 98–111)
Chloride: 113 mmol/L — ABNORMAL HIGH (ref 98–111)
Creatinine, Ser: 0.89 mg/dL (ref 0.44–1.00)
Creatinine, Ser: 0.66 mg/dL (ref 0.44–1.00)
GFR, Estimated: >60 mL/min (ref >60)
GFR, Estimated: >60 mL/min (ref >60)
Glucose, Bld: 81 mg/dL (ref 70–99)
Glucose, Bld: 87 mg/dL (ref 70–99)
Potassium: 5.1 mmol/L (ref 3.5–5.1)
Potassium: 4.1 mmol/L (ref 3.5–5.1)
Sodium: 134 mmol/L — ABNORMAL LOW (ref 135–145)
Sodium: 136 mmol/L (ref 135–145)

## 2020-08-07 LAB — CBC
HCT: 39.1 % (ref 36.0–46.0)
HCT: 37.3 % (ref 36.0–46.0)
HCT: 34.2 % — ABNORMAL LOW (ref 36.0–46.0)
Hemoglobin: 12 g/dL (ref 12.0–15.0)
Hemoglobin: 11.8 g/dL — ABNORMAL LOW (ref 12.0–15.0)
Hemoglobin: 11.2 g/dL — ABNORMAL LOW (ref 12.0–15.0)
MCH: 23.7 pg — ABNORMAL LOW (ref 26.0–34.0)
MCH: 23.9 pg — ABNORMAL LOW (ref 26.0–34.0)
MCH: 23.8 pg — ABNORMAL LOW (ref 26.0–34.0)
MCHC: 30.7 g/dL (ref 30.0–36.0)
MCHC: 31.6 g/dL (ref 30.0–36.0)
MCHC: 32.7 g/dL (ref 30.0–36.0)
MCV: 77.1 fL — ABNORMAL LOW (ref 80.0–100.0)
MCV: 75.7 fL — ABNORMAL LOW (ref 80.0–100.0)
MCV: 72.8 fL — ABNORMAL LOW (ref 80.0–100.0)
Platelets: 426 10*3/uL — ABNORMAL HIGH (ref 150–400)
Platelets: 407 10*3/uL — ABNORMAL HIGH (ref 150–400)
Platelets: 344 10*3/uL (ref 150–400)
RBC: 5.07 MIL/uL (ref 3.87–5.11)
RBC: 4.93 MIL/uL (ref 3.87–5.11)
RBC: 4.7 MIL/uL (ref 3.87–5.11)
RDW: 25.2 % — ABNORMAL HIGH (ref 11.5–15.5)
RDW: 24.9 % — ABNORMAL HIGH (ref 11.5–15.5)
RDW: 24.8 % — ABNORMAL HIGH (ref 11.5–15.5)
WBC: 8.3 10*3/uL (ref 4.0–10.5)
WBC: 8.7 10*3/uL (ref 4.0–10.5)
WBC: 8.1 10*3/uL (ref 4.0–10.5)
nRBC: 1.9 % — ABNORMAL HIGH (ref 0.0–0.2)
nRBC: 1.8 % — ABNORMAL HIGH (ref 0.0–0.2)
nRBC: 2 % — ABNORMAL HIGH (ref 0.0–0.2)

## 2020-08-07 LAB — BLOOD GAS, ARTERIAL
Acid-base deficit: 17.2 mmol/L — ABNORMAL HIGH (ref 0.0–2.0)
Bicarbonate: 8.5 mmol/L — ABNORMAL LOW (ref 20.0–28.0)
Drawn by: 560031
FIO2: 21
O2 Saturation: 94.8 %
Patient temperature: 98.8
pCO2 arterial: 20.3 mmHg — ABNORMAL LOW (ref 32.0–48.0)
pH, Arterial: 7.247 — ABNORMAL LOW (ref 7.350–7.450)
pO2, Arterial: 74.3 mmHg — ABNORMAL LOW (ref 83.0–108.0)

## 2020-08-07 LAB — GLUCOSE, CAPILLARY
Glucose-Capillary: 90 mg/dL (ref 70–99)
Glucose-Capillary: 75 mg/dL (ref 70–99)
Glucose-Capillary: 88 mg/dL (ref 70–99)
Glucose-Capillary: 96 mg/dL (ref 70–99)

## 2020-08-07 LAB — PREPARE RBC (CROSSMATCH)

## 2020-08-07 LAB — HIV ANTIBODY (ROUTINE TESTING W REFLEX): HIV Screen 4th Generation wRfx: NONREACTIVE

## 2020-08-07 MED ORDER — SODIUM CHLORIDE 0.9% IV SOLUTION: Freq: Once | INTRAVENOUS | Status: AC

## 2020-08-07 MED ORDER — ONDANSETRON HCL 4 MG/2ML IJ SOLN
4.0000 mg | Freq: Four times a day (QID) | INTRAMUSCULAR | Status: DC | PRN
  Administered 2020-08-09: 4 mg via INTRAVENOUS
  Filled 2020-08-07: qty 2

## 2020-08-07 MED ORDER — TRAZODONE HCL 50 MG PO TABS
100.0000 mg | ORAL_TABLET | Freq: Every day | ORAL | Status: DC
  Administered 2020-08-07 – 2020-08-11 (×5): 100 mg via ORAL
  Filled 2020-08-07 (×5): qty 2

## 2020-08-07 MED ORDER — GERHARDT'S BUTT CREAM
TOPICAL_CREAM | Freq: Three times a day (TID) | CUTANEOUS | Status: DC
  Administered 2020-08-12 (×2): 1 via TOPICAL
  Filled 2020-08-07: qty 1

## 2020-08-07 MED ORDER — ORAL CARE MOUTH RINSE
15.0000 mL | Freq: Two times a day (BID) | OROMUCOSAL | Status: DC
  Administered 2020-08-07 – 2020-08-12 (×11): 15 mL via OROMUCOSAL

## 2020-08-07 MED ORDER — ONDANSETRON HCL 4 MG/2ML IJ SOLN
INTRAMUSCULAR | Status: AC
  Administered 2020-08-07: 4 mg via INTRAVENOUS
  Filled 2020-08-07: qty 2

## 2020-08-07 MED ORDER — STERILE WATER FOR INJECTION IV SOLN
INTRAVENOUS | Status: DC
  Filled 2020-08-07 (×2): qty 150
  Filled 2020-08-07: qty 850
  Filled 2020-08-07 (×4): qty 150

## 2020-08-07 NOTE — Progress Notes (Signed)
[facility] Progress Note [name] DOB: January 01, 1956 MRN: [id number]   Date of Service  08/07/2020  HPI/Events of Note  Patient requests home Trazodone Q HS.  eICU Interventions  Plan: 1.Trazodone 100 mg PO Q HS.     Intervention Category Major Interventions: Other:  Lysle Dingwall 08/07/2020, 10:08 PM

## 2020-08-07 NOTE — Progress Notes (Signed)
[facility] Progress Note [name] DOB: [birth year] MRN: [id number]   Date of Service  08/07/2020  HPI/Events of Note  Patient with persistent hypotension, with SBP 70-80 and MAP 50 mmHg.  eICU Interventions  Will accelerate transfusion rate of second unit of blood, and add a third unit.        [name] 08/07/2020, 12:26 AM

## 2020-08-07 NOTE — Progress Notes (Addendum)
NAME:  Jocelyn Sanchez, MRN:  [id number], DOB:  Sep 06, 1955, LOS: 1 ADMISSION DATE:  08/06/2020, CONSULTATION DATE:  08/06/20 REFERRING MD:  Arlean Hopping, PA CHIEF COMPLAINT:  Hypotension, acute blood loss anemia  Brief History:  65 year old female with MS and hx of prior GIB, hx gastric duodenal AVMs admitted for profound acute blood loss anemia secondary to suspected GI bleed.  History of Present Illness:  Ms. Jocelyn Sanchez is a 65 year old bedbound female with MS, chronic systolic heart failure, hx PE on Eliquis, gastric and duodenal AVM, hx GI bleed who presents with cough and weakness. She reports recent home exposure to an individual with COVID and tested positive on a home test four days prior to admission. Denies fevers, shortness of breath, wheezing. Denies abdominal pain, nausea, emesis or change in bowel habits.  In the ED she became hypotensive with SBP to the 70s and given IVF bolus with response. 2nd bolus given. Labs showed profound Hg drop to 4.0 which was confirmed on repeat. Labs also significant for AKI with elevated BUN and metabolic acidosis with CO2 10 PCCM and GI consulted.   Past Medical History:  MS, chronic systolic heart failure, hx PE on Eliquis, gastric and duodenal AVM, hx GI bleed  Significant Hospital Events:  2/18 Admit to ICU for Hg 4 and hypotension 2/19 Received 2U PRBC with improvement of Hg 12  Consults:  PCCM GI  Procedures:    Significant Diagnostic Tests:    Micro Data:  SARS-COVID 2/18 - POSITIVE BCx 2/18 UCx 2/18  Antimicrobials:    Interim History / Subjective:  Received 2U PRBC with improvement of Hg 12  Objective   Blood pressure (!) 118/49, pulse 84, temperature (!) 97.4 F (36.3 C), temperature source Oral, resp. rate (!) 22, height 5\' 3"  (1.6 m), weight 52.3 kg, SpO2 100 %.        Intake/Output Summary (Last 24 hours) at 08/07/2020 0757 Last data filed at 08/07/2020 0300 Gross per 24 hour  Intake 1017.5 ml  Output --  Net  1017.5 ml   Filed Weights   08/06/20 1237 08/06/20 2200 08/07/20 0500  Weight: 65 kg 51.5 kg 52.3 kg   Physical Exam: General: Chronically ill-appearing, no acute distress, pleasant female, upper extremity contractures HENT: [place], AT, OP clear, MMM Eyes: EOMI, no scleral icterus Respiratory: Clear to auscultation bilaterally.  No crackles, wheezing or rales Cardiovascular: RRR, -M/R/G, no JVD GI: BS+, soft, nontender Extremities: Muscle wasting, -edema,-tenderness Neuro: AAO x4, CNII-XII grossly intact  Resolved Hospital Problem list     Assessment & Plan:  Acute blood loss anemia - improving Admitted for Hg 4, confirmed on repeat. Hx of GIB secondary to gastric and duodenal AVMs. No report or evidence of hematemesis, hematochezia or melena. Patient denies being on Eliquis however Pharmacy reports 60 day prescription was filled on 06/08/20.  --S/p PRBC x 2 with dramatic response of Hg to 12. Suspect lab error  --Trend CBC q6h --Transfuse PRBC x 2 --PPI BID --Appreciate GI input. Plan for evaluation when stable with capsule endoscopy vs endoscopy  Hemorrhagic shock vs Hypovolemic hypotension. Could consider septic shock secondary to COVID-19 however less likely. --Fluid responsive. S/p NS and PRBC x 2 on 2/18. --Transfuse for Hg <7 --Follow-up cultures --Hold on antibiotics for now due to low suspicion for bacterial infection --Hold home anti-hypertensive agents  History of PE --Hold home Eliquis  AKI - Cr improved. Fair UOP Metabolic acidosis - worsening --Likely pre-renal in setting of  volume loss --ABG not obtained overnight. Spoke with staff for ABG this morning.  --ABG reviewed and showed metabolic acidosis and respiratory compensation with pH 7.2 --Start sodium bicarbonate gtt  --Monitor UOP/Cr  COVID-19 positive Mild symptoms. Recently exposed to contact in household. SpO2 100% on room air. No infiltrates on admission CXR. --No indication for steroids  Chronic  systolic heart failure --Hold home carvedilol in setting of hypotension  DM2 --CBG q4h  Multiple sclerosis with contractures Decubitus ulcer --Will order wound care   Best practice (evaluated daily)  Diet: CLD Pain/Anxiety/Delirium protocol (if indicated): N/A VAP protocol (if indicated): N/A DVT prophylaxis: SCDs GI prophylaxis: PPI BID Glucose control: CBG q4h Mobility: As tolerate Disposition: Remain in ICU  Goals of Care:  Last date of multidisciplinary goals of care discussion:08/06/20 Family and staff present: Patient and PCCM (Dr. Loanne Drilling) Summary of discussion: Confirmed DNR. She has completed advanced directives prior to hospitalization. Follow up goals of care discussion due: 08/13/20 Code Status: DNR  Labs   CBC: Recent Labs  Lab 08/06/20 1308 08/06/20 1637 08/07/20 0537  WBC 9.9 9.7 8.3  NEUTROABS 7.6  --   --   HGB 4.0* 4.1* 12.0  HCT 15.0* 15.9* 39.1  MCV 62.8* 63.6* 77.1*  PLT 712* 710* 426*    Basic Metabolic Panel: Recent Labs  Lab 08/06/20 1308 08/07/20 0537  NA 127* 134*  K 4.5 5.1  CL 106 113*  CO2 10* 8*  GLUCOSE 123* 81  BUN 52* 41*  CREATININE 1.04* 0.89  CALCIUM 8.4* 8.1*   GFR: Estimated Creatinine Clearance: 52 mL/min (by C-G formula based on SCr of 0.89 mg/dL). Recent Labs  Lab 08/06/20 1308 08/06/20 1637 08/07/20 0537  PROCALCITON 3.51  --   --   WBC 9.9 9.7 8.3  LATICACIDVEN 1.0  --   --     Liver Function Tests: Recent Labs  Lab 08/06/20 1308  AST 8*  ALT 6  ALKPHOS 82  BILITOT 0.8  PROT 7.8  ALBUMIN 2.9*   No results for input(s): LIPASE, AMYLASE in the last 168 hours. No results for input(s): AMMONIA in the last 168 hours.  ABG    Component Value Date/Time   HCO3 16.8 (L) 08/07/2017 2305   TCO2 18 (L) 08/07/2017 2310   ACIDBASEDEF 7.4 (H) 08/07/2017 2305   O2SAT 45.1 08/07/2017 2305     Coagulation Profile: No results for input(s): INR, PROTIME in the last 168 hours.  Cardiac Enzymes: No  results for input(s): CKTOTAL, CKMB, CKMBINDEX, TROPONINI in the last 168 hours.  HbA1C: Hgb A1c MFr Bld  Date/Time Value Ref Range Status  07/27/2019 07:05 AM 6.7 (H) 4.8 - 5.6 % Final    Comment:    (NOTE)         Prediabetes: 5.7 - 6.4         Diabetes: >6.4         Glycemic control for adults with diabetes: <7.0   02/11/2019 02:03 AM 5.9 (H) 4.8 - 5.6 % Final    Comment:    (NOTE) Pre diabetes:          5.7%-6.4% Diabetes:              >6.4% Glycemic control for   <7.0% adults with diabetes     CBG: Recent Labs  Lab 08/06/20 2021 08/06/20 2333 08/07/20 0318  GLUCAP 121* 98 90    Critical care time: 31 min    The patient is critically ill with multiple organ systems  failure and requires high complexity decision making for assessment and support, frequent evaluation and titration of therapies, application of advanced monitoring technologies and extensive interpretation of multiple databases.   Rodman Pickle, M.D. [facility] Pulmonary/Critical Care Medicine 08/07/2020 7:58 AM   Please see Amion for pager number to reach on-call Pulmonary and Critical Care Team.

## 2020-08-07 NOTE — Consult Note (Signed)
WOC Nurse Consult Note: Reason for Consult: Stage 3 pressure injury to sacrum, Stage 2 pressure injury to left heel.  MASD, specifically irritant contact dermatitis due to fecal and urinary incontinence  Wound type: pressure, moisture Pressure Injury POA: Yes Measurement:  Sacral:  8cm x 4cm x 2cm and left heel 2cm x 2cm x 0.1cm (measurements obtained by Bedside RN and documented on Nursing Flow Sheet upon admission to ICU) Wound bed: Sacral is 100% red, moist, left heel is pink, dry Drainage (amount, consistency, odor) Sacral with small serous, heel is dry small Periwound:Intact Dressing procedure/placement/frequency: I will provide guidance for the Nursing staff for topical care using a xeroform topped with dry gauze and a silicone foam heel dressing for the left heel and a silver hydrofiber topped with dry gauze and covered with a silicone foam dressing for the sacrum. Patient is being turned and repositioned from side to side despite contractions. The heels will be offloaded. Post incontinence episodes a thin layer of Gerhart's Butt Cream (a compounded 1:1:1 zinc oxide, hydrocortisone, lotrimin product) will be applied after cleansing.  [place] nursing team will not follow, but will remain available to this patient, the nursing and medical teams.  Please re-consult if needed. Thanks, Maudie Flakes, MSN, RN, [place], Arther Abbott  Pager# [phone]      d

## 2020-08-07 NOTE — Progress Notes (Signed)
No signs of GI blood loss in discussion with patient's nurse and no other specific complaints and she has not had a bowel movement and her hemoglobin is probably not really 12 but certainly improved and BUN and creatinine have decreased with hydration and please call me if any question or problem from a GI standpoint this weekend otherwise we will check computer tomorrow and make rounding team aware of her next week

## 2020-08-08 DIAGNOSIS — D62 Acute posthemorrhagic anemia: Secondary | ICD-10-CM | POA: Diagnosis not present

## 2020-08-08 DIAGNOSIS — E872 Acidosis: Secondary | ICD-10-CM | POA: Diagnosis not present

## 2020-08-08 LAB — GLUCOSE, CAPILLARY
Glucose-Capillary: 105 mg/dL — ABNORMAL HIGH (ref 70–99)
Glucose-Capillary: 117 mg/dL — ABNORMAL HIGH (ref 70–99)
Glucose-Capillary: 136 mg/dL — ABNORMAL HIGH (ref 70–99)
Glucose-Capillary: 157 mg/dL — ABNORMAL HIGH (ref 70–99)
Glucose-Capillary: 166 mg/dL — ABNORMAL HIGH (ref 70–99)
Glucose-Capillary: 84 mg/dL (ref 70–99)
Glucose-Capillary: 91 mg/dL (ref 70–99)

## 2020-08-08 LAB — CBC
HCT: 32.9 % — ABNORMAL LOW (ref 36.0–46.0)
Hemoglobin: 11.2 g/dL — ABNORMAL LOW (ref 12.0–15.0)
MCH: 23.9 pg — ABNORMAL LOW (ref 26.0–34.0)
MCHC: 34 g/dL (ref 30.0–36.0)
MCV: 70.1 fL — ABNORMAL LOW (ref 80.0–100.0)
Platelets: 381 10*3/uL (ref 150–400)
RBC: 4.69 MIL/uL (ref 3.87–5.11)
RDW: 24.9 % — ABNORMAL HIGH (ref 11.5–15.5)
WBC: 10.4 10*3/uL (ref 4.0–10.5)
nRBC: 1.5 % — ABNORMAL HIGH (ref 0.0–0.2)

## 2020-08-08 LAB — TYPE AND SCREEN
ABO/RH(D): O POS
Antibody Screen: NEGATIVE
Unit division: 0
Unit division: 0
Unit division: 0

## 2020-08-08 LAB — BASIC METABOLIC PANEL
Anion gap: 11 (ref 5–15)
BUN: 25 mg/dL — ABNORMAL HIGH (ref 8–23)
CO2: 19 mmol/L — ABNORMAL LOW (ref 22–32)
Calcium: 7.6 mg/dL — ABNORMAL LOW (ref 8.9–10.3)
Chloride: 105 mmol/L (ref 98–111)
Creatinine, Ser: 0.6 mg/dL (ref 0.44–1.00)
GFR, Estimated: >60 mL/min (ref >60)
Glucose, Bld: 97 mg/dL (ref 70–99)
Potassium: 4.6 mmol/L (ref 3.5–5.1)
Sodium: 135 mmol/L (ref 135–145)

## 2020-08-08 LAB — BPAM RBC
Blood Product Expiration Date: 202203192359
Blood Product Expiration Date: 202203192359
Blood Product Expiration Date: 202203212359
ISSUE DATE / TIME: 202202181721
ISSUE DATE / TIME: 202202182232
ISSUE DATE / TIME: 202202190053
Unit Type and Rh: 5100
Unit Type and Rh: 5100
Unit Type and Rh: 5100

## 2020-08-08 MED ORDER — ACETAMINOPHEN 325 MG PO TABS
650.0000 mg | ORAL_TABLET | Freq: Four times a day (QID) | ORAL | Status: DC | PRN
  Administered 2020-08-08 – 2020-08-11 (×8): 650 mg via ORAL
  Filled 2020-08-08 (×8): qty 2

## 2020-08-08 MED ORDER — CARVEDILOL 6.25 MG PO TABS
6.2500 mg | ORAL_TABLET | Freq: Two times a day (BID) | ORAL | Status: DC
  Administered 2020-08-08 – 2020-08-12 (×9): 6.25 mg via ORAL
  Filled 2020-08-08 (×9): qty 1

## 2020-08-08 NOTE — Progress Notes (Signed)
Provided flat touch call bell and placed under patient's chin as she requested. Patient resting well.

## 2020-08-08 NOTE — Progress Notes (Signed)
NAME:  Jocelyn Sanchez, MRN:  [id number], DOB:  Jul 11, 1955, LOS: 2 ADMISSION DATE:  08/06/2020, CONSULTATION DATE:  08/06/20 REFERRING MD:  Arlean Hopping, PA CHIEF COMPLAINT:  Hypotension, acute blood loss anemia  Brief History:  65 year old female with MS and hx of prior GIB, hx gastric duodenal AVMs admitted for profound acute blood loss anemia secondary to suspected GI bleed.  History of Present Illness:  Ms. Jocelyn Sanchez is a 65 year old bedbound female with MS, chronic systolic heart failure, hx PE on Eliquis, gastric and duodenal AVM, hx GI bleed who presents with cough and weakness. She reports recent home exposure to an individual with COVID and tested positive on a home test four days prior to admission. Denies fevers, shortness of breath, wheezing. Denies abdominal pain, nausea, emesis or change in bowel habits.  In the ED she became hypotensive with SBP to the 70s and given IVF bolus with response. 2nd bolus given. Labs showed profound Hg drop to 4.0 which was confirmed on repeat. Labs also significant for AKI with elevated BUN and metabolic acidosis with CO2 10 PCCM and GI consulted.   Past Medical History:  MS, chronic systolic heart failure, hx PE on Eliquis, gastric and duodenal AVM, hx GI bleed  Significant Hospital Events:  2/18 Admit to ICU for Hg 4 and hypotension 2/19 Received 2U PRBC with improvement of Hg 12  Consults:  PCCM GI  Procedures:    Significant Diagnostic Tests:    Micro Data:  SARS-COVID 2/18 - POSITIVE BCx 2/18 UCx 2/18  Antimicrobials:    Interim History / Subjective:  Reports headache and back pain this morning. Denies fatigue, nausea.  Objective   Blood pressure (!) 111/55, pulse 81, temperature 98.7 F (37.1 C), temperature source Oral, resp. rate 16, height 5\' 3"  (1.6 m), weight 52.3 kg, SpO2 98 %.        Intake/Output Summary (Last 24 hours) at 08/08/2020 0743 Last data filed at 08/08/2020 0400 Gross per 24 hour  Intake 2041.43 ml   Output 600 ml  Net 1441.43 ml   Filed Weights   08/06/20 1237 08/06/20 2200 08/07/20 0500  Weight: 65 kg 51.5 kg 52.3 kg   Physical Exam: General: Chronically ill-appearing, no acute distress, upper extremity contractures HENT: [place], AT, OP clear, MMM Eyes: EOMI, no scleral icterus Respiratory: Clear to auscultation bilaterally.  No crackles, wheezing or rales Cardiovascular: RRR, -M/R/G, no JVD GI: BS+, soft, nontender Extremities: Muscle wasting, -edema,-tenderness Neuro: AAO x4, CNII-XII grossly intact Psych: Normal mood, normal affect GU: External foley in place   Resolved Hospital Problem list     Assessment & Plan:  Acute blood loss anemia - Stable Hg post transfusion of PRBC x 2 Admitted for Hg 4, confirmed on repeat. Hx of GIB secondary to gastric and duodenal AVMs. No report or evidence of hematemesis, hematochezia or melena. Patient denies being on Eliquis however Pharmacy reports 60 day prescription was filled on 06/08/20.  --S/p PRBC x 2 on 2/19 --Trend CBC daily --PPI BID --Appreciate GI input. Plan for evaluation next week when stable with capsule endoscopy vs endoscopy  Hemorrhagic shock vs Hypovolemic hypotension - resolved --Fluid responsive. S/p NS and PRBC x 2 on 2/18. --Transfuse for Hg <7 --Follow-up cultures --Hold on antibiotics for now due to low suspicion for bacterial infection --Hold home anti-hypertensive agents  History of PE --Hold home Eliquis  AKI - Cr improved. Fair UOP Metabolic acidosis - improving --Likely pre-renal in setting of volume loss --  Continue sodium bicarbonate gtt. Will discontinue today or tomorrow. --Monitor UOP/Cr  COVID-19 positive Mild symptoms. Recently exposed to contact in household. SpO2 100% on room air. No infiltrates on admission CXR. --No indication for steroids  Chronic systolic heart failure --Restart home carvedilol   DM2 --CBG q4h  Multiple sclerosis with contractures Decubitus ulcer --Will  order wound care   Best practice (evaluated daily)  Diet: Regular diet Pain/Anxiety/Delirium protocol (if indicated): N/A VAP protocol (if indicated): N/A DVT prophylaxis: SCDs GI prophylaxis: PPI BID Glucose control: CBG q4h Mobility: As tolerate Disposition: Remain in ICU. May be able to transfer to [facility] this afternoon  Goals of Care:  Last date of multidisciplinary goals of care discussion:08/06/20 Family and staff present: Patient and PCCM (Dr. Loanne Drilling) Summary of discussion: Confirmed DNR. She has completed advanced directives prior to hospitalization. Follow up goals of care discussion due: 08/13/20 Code Status: DNR  Labs   CBC: Recent Labs  Lab 08/06/20 1308 08/06/20 1637 08/07/20 0537 08/07/20 1135 08/07/20 1639 08/08/20 0555  WBC 9.9 9.7 8.3 8.7 8.1 10.4  NEUTROABS 7.6  --   --   --   --   --   HGB 4.0* 4.1* 12.0 11.8* 11.2* 11.2*  HCT 15.0* 15.9* 39.1 37.3 34.2* 32.9*  MCV 62.8* 63.6* 77.1* 75.7* 72.8* 70.1*  PLT 712* 710* 426* 407* 344 453    Basic Metabolic Panel: Recent Labs  Lab 08/06/20 1308 08/07/20 0537 08/07/20 1639 08/08/20 0555  NA 127* 134* 136 135  K 4.5 5.1 4.1 4.6  CL 106 113* 113* 105  CO2 10* 8* 13* 19*  GLUCOSE 123* 81 87 97  BUN 52* 41* 31* 25*  CREATININE 1.04* 0.89 0.66 0.60  CALCIUM 8.4* 8.1* 8.0* 7.6*   GFR: Estimated Creatinine Clearance: 57.9 mL/min (by C-G formula based on SCr of 0.6 mg/dL). Recent Labs  Lab 08/06/20 1308 08/06/20 1637 08/07/20 0537 08/07/20 1135 08/07/20 1639 08/08/20 0555  PROCALCITON 3.51  --   --   --   --   --   WBC 9.9   < > 8.3 8.7 8.1 10.4  LATICACIDVEN 1.0  --   --   --   --   --    < > = values in this interval not displayed.    Liver Function Tests: Recent Labs  Lab 08/06/20 1308  AST 8*  ALT 6  ALKPHOS 82  BILITOT 0.8  PROT 7.8  ALBUMIN 2.9*   No results for input(s): LIPASE, AMYLASE in the last 168 hours. No results for input(s): AMMONIA in the last 168 hours.  ABG     Component Value Date/Time   PHART 7.247 (L) 08/07/2020 0809   PCO2ART 20.3 (L) 08/07/2020 0809   PO2ART 74.3 (L) 08/07/2020 0809   HCO3 8.5 (L) 08/07/2020 0809   TCO2 18 (L) 08/07/2017 2310   ACIDBASEDEF 17.2 (H) 08/07/2020 0809   O2SAT 94.8 08/07/2020 0809     Coagulation Profile: No results for input(s): INR, PROTIME in the last 168 hours.  Cardiac Enzymes: No results for input(s): CKTOTAL, CKMB, CKMBINDEX, TROPONINI in the last 168 hours.  HbA1C: Hgb A1c MFr Bld  Date/Time Value Ref Range Status  07/27/2019 07:05 AM 6.7 (H) 4.8 - 5.6 % Final    Comment:    (NOTE)         Prediabetes: 5.7 - 6.4         Diabetes: >6.4         Glycemic control for adults with diabetes: <  7.0   02/11/2019 02:03 AM 5.9 (H) 4.8 - 5.6 % Final    Comment:    (NOTE) Pre diabetes:          5.7%-6.4% Diabetes:              >6.4% Glycemic control for   <7.0% adults with diabetes     CBG: Recent Labs  Lab 08/07/20 0816 08/07/20 1637 08/07/20 2114 08/08/20 0039 08/08/20 0444  GLUCAP 75 88 96 105* 84    Care time: 35 min    Rodman Pickle, M.D. [facility] Pulmonary/Critical Care Medicine 08/08/2020 7:43 AM   Please see Amion for pager number to reach on-call Pulmonary and Critical Care Team.

## 2020-08-08 NOTE — Progress Notes (Signed)
Patient seen and case discussed with nurse and she is not having any signs of bleeding and she is eating okay and moving her bowels and her hemoglobin is stable and her BUN and creatinine are improved and I will ask the rounding team to look in on next week and decide the timing either capsule endoscopy versus endoscopy versus enteroscope

## 2020-08-09 DIAGNOSIS — L8990 Pressure ulcer of unspecified site, unspecified stage: Secondary | ICD-10-CM | POA: Insufficient documentation

## 2020-08-09 DIAGNOSIS — Z86711 Personal history of pulmonary embolism: Secondary | ICD-10-CM

## 2020-08-09 DIAGNOSIS — I5042 Chronic combined systolic (congestive) and diastolic (congestive) heart failure: Secondary | ICD-10-CM

## 2020-08-09 LAB — GLUCOSE, CAPILLARY
Glucose-Capillary: 80 mg/dL (ref 70–99)
Glucose-Capillary: 113 mg/dL — ABNORMAL HIGH (ref 70–99)
Glucose-Capillary: 103 mg/dL — ABNORMAL HIGH (ref 70–99)
Glucose-Capillary: 139 mg/dL — ABNORMAL HIGH (ref 70–99)
Glucose-Capillary: 142 mg/dL — ABNORMAL HIGH (ref 70–99)
Glucose-Capillary: 117 mg/dL — ABNORMAL HIGH (ref 70–99)

## 2020-08-09 LAB — BASIC METABOLIC PANEL
Anion gap: 11 (ref 5–15)
BUN: 18 mg/dL (ref 8–23)
CO2: 28 mmol/L (ref 22–32)
Calcium: 6.9 mg/dL — ABNORMAL LOW (ref 8.9–10.3)
Chloride: 96 mmol/L — ABNORMAL LOW (ref 98–111)
Creatinine, Ser: 0.55 mg/dL (ref 0.44–1.00)
GFR, Estimated: >60 mL/min (ref >60)
Glucose, Bld: 134 mg/dL — ABNORMAL HIGH (ref 70–99)
Potassium: 3 mmol/L — ABNORMAL LOW (ref 3.5–5.1)
Sodium: 135 mmol/L (ref 135–145)

## 2020-08-09 MED ORDER — DICLOFENAC SODIUM 1 % EX GEL
2.0000 g | Freq: Four times a day (QID) | CUTANEOUS | Status: DC
  Filled 2020-08-09: qty 100

## 2020-08-09 MED ORDER — DICLOFENAC SODIUM 1 % EX GEL
2.0000 g | Freq: Four times a day (QID) | CUTANEOUS | Status: DC
  Administered 2020-08-09 – 2020-08-12 (×11): 2 g via TOPICAL

## 2020-08-09 MED ORDER — POTASSIUM CHLORIDE 10 MEQ/100ML IV SOLN
10.0000 meq | INTRAVENOUS | Status: AC
  Administered 2020-08-09 (×4): 10 meq via INTRAVENOUS
  Filled 2020-08-09 (×4): qty 100

## 2020-08-09 MED ORDER — NEPRO/CARBSTEADY PO LIQD
237.0000 mL | Freq: Three times a day (TID) | ORAL | Status: DC
  Administered 2020-08-09 – 2020-08-11 (×5): 237 mL via ORAL
  Filled 2020-08-09 (×10): qty 237

## 2020-08-09 MED ORDER — HYDROXYZINE HCL 25 MG PO TABS
25.0000 mg | ORAL_TABLET | Freq: Three times a day (TID) | ORAL | Status: DC | PRN
  Administered 2020-08-09: 25 mg via ORAL
  Filled 2020-08-09: qty 1

## 2020-08-09 MED ORDER — POTASSIUM CHLORIDE CRYS ER 20 MEQ PO TBCR
20.0000 meq | EXTENDED_RELEASE_TABLET | ORAL | Status: AC
  Administered 2020-08-09 (×2): 20 meq via ORAL
  Filled 2020-08-09 (×2): qty 1

## 2020-08-09 MED ORDER — ADULT MULTIVITAMIN W/MINERALS CH
1.0000 | ORAL_TABLET | Freq: Every day | ORAL | Status: DC
  Administered 2020-08-10 – 2020-08-12 (×3): 1 via ORAL
  Filled 2020-08-09 (×3): qty 1

## 2020-08-09 MED ORDER — DEXTROMETHORPHAN POLISTIREX ER 30 MG/5ML PO SUER
30.0000 mg | Freq: Three times a day (TID) | ORAL | Status: DC | PRN
  Administered 2020-08-09 – 2020-08-10 (×3): 30 mg via ORAL
  Filled 2020-08-09 (×4): qty 5

## 2020-08-09 MED ORDER — ASCORBIC ACID 500 MG PO TABS
250.0000 mg | ORAL_TABLET | Freq: Two times a day (BID) | ORAL | Status: DC
Start: 2020-08-09 — End: 2020-08-10
  Administered 2020-08-09 – 2020-08-10 (×2): 250 mg via ORAL
  Filled 2020-08-09 (×2): qty 1

## 2020-08-09 MED ORDER — SODIUM CHLORIDE 0.9 % IV SOLN: INTRAVENOUS | Status: DC

## 2020-08-09 NOTE — Progress Notes (Signed)
[facility] Gastroenterology Progress Note  Jocelyn Sanchez 65 y.o. 1955-08-27  CC: Anemia  Subjective: Patient denies any complaints.  She states she wants to go home.  Denies nausea/vomiting, abdominal pain, melena, or hematochezia.    ROS : Review of Systems  Respiratory: Negative for cough and shortness of breath.   Gastrointestinal: Negative for abdominal pain, blood in stool, constipation, diarrhea, heartburn, melena, nausea and vomiting.   Objective: Vital signs in last 24 hours: Vitals:   08/09/20 0427 08/09/20 0745  BP:    Pulse:    Resp:    Temp: (!) 100.8 F (38.2 C) 98.8 F (37.1 C)  SpO2:      Physical Exam:  General:  Chronically ill-appearing, alert, cooperative, no distress  Head:  Normocephalic, without obvious abnormality, atraumatic  Eyes:  Anicteric sclera, EOMs intact  Lungs:   Clear to auscultation bilaterally, respirations unlabored  Heart:  Regular rate and rhythm, S1, S2 normal  Abdomen:   Soft, non-tender, bowel sounds active all four quadrants,  no masses,   Extremities: Thin, contractures (upper extremities), no  edema  Pulses: 2+ and symmetric    Lab Results: Recent Labs    08/08/20 0555 08/09/20 0226  NA 135 135  K 4.6 3.0*  CL 105 96*  CO2 19* 28  GLUCOSE 97 134*  BUN 25* 18  CREATININE 0.60 0.55  CALCIUM 7.6* 6.9*   Recent Labs    08/06/20 1308  AST 8*  ALT 6  ALKPHOS 82  BILITOT 0.8  PROT 7.8  ALBUMIN 2.9*   Recent Labs    08/06/20 1308 08/06/20 1637 08/07/20 1639 08/08/20 0555  WBC 9.9   < > 8.1 10.4  NEUTROABS 7.6  --   --   --   HGB 4.0*   < > 11.2* 11.2*  HCT 15.0*   < > 34.2* 32.9*  MCV 62.8*   < > 72.8* 70.1*  PLT 712*   < > 344 381   < > = values in this interval not displayed.   No results for input(s): LABPROT, INR in the last 72 hours.    Assessment: Anemia, history of duodenal AVMs.  Presentation concerning for ongoing, slow GI blood loss from AVMs. -Heme-negative brown stool -Hgb 11.2, stable  A  fib, on Eliquis  COVID-19 infection.  Tmax 100.58F this morning, now normal.  She is breathing comfortably on room air and without respiratory complaints  Multiple Sclerosis  Plan: Recommend EGD tomorrow (patient ate breakfast).  However, patient states she wants to go home and does not want EGD.  Advised patient she could be having bleeding AVMs, and EGD would allow for possible diagnosis/treatment.  She states she will think about it and let us know if she decides to proceed with EGD.  Continue supportive care and IV Protonix.  Continue to hold Eliquis.  Continue to trend CBC with transfusion as needed to maintain Hgb ~7.   Salley Slaughter PA-C 08/09/2020, 11:02 AM  Contact #  [phone]

## 2020-08-09 NOTE — Hospital Course (Signed)
Jocelyn Sanchez is a 65 yo female with PMH gastric/duodenal AVMs, MS (bedbound), hx PE (on chronic Eliquis), lymphedema, HTN, DMII, chronic combined systolic diastolic CHF (EF 54-62%, Gr 1 DD on 08/09/17 echo) who presented with cough, weakness and found to have hemoglobin of 4 g/dL on admission.  She received 2 units PRBC and repeat hemoglobin was 12 g/dL. Of note, 4 days prior to admission hemoglobin had been checked on 08/03/2019 and was 11.7 g/dL at that time. Hemoglobin has remained stable since transfusion with ongoing trending.  GI was consulted and given her history of underlying AVMs, there is tentative plan for pursuing endoscopy when patient is amenable (she refused intervention on 08/09/2020). She was also found to be positive for COVID-19 on screening on admission on 08/06/2020.  Due to minimal symptoms and requiring room air, she was not started on any treatment.  Eliquis has been on hold since admission.

## 2020-08-09 NOTE — Progress Notes (Signed)
Initial Nutrition Assessment  DOCUMENTATION CODES:   Not applicable  INTERVENTION:   Nepro Shake po TID, each supplement provides 425 kcal and 19 grams protein  MVI po daily   Vitamin C 250mg  po BID  Pt at high refeed risk; recommend monitor potassium, magnesium and phosphorus labs daily until stable  NUTRITION DIAGNOSIS:   Increased nutrient needs related to catabolic illness (COVID 19, wound healing) as evidenced by increased estimated needs.  GOAL:   Patient will meet greater than or equal to 90% of their needs  MONITOR:   PO intake,Supplement acceptance,Labs,Weight trends,Skin,I & O's  REASON FOR ASSESSMENT:   Malnutrition Screening Tool    ASSESSMENT:   65 yo female with PMH gastric/duodenal AVMs, MS (bedbound), hx PE (on chronic Eliquis), lymphedema, HTN, DMII, chronic combined systolic diastolic CHF (EF 82-64%, Gr 1 DD on 08/09/17 echo) who is admitted with COVID 19 and GIB.  RD working remotely.  Unable to reach pt by phone. Per chart review, pt eating 30% of meals in hospital. Suspect pt with poor appetite and oral intake pta r/t COVID 19. Per chart, pt is down 22lbs(15%) from her UBW; there is no recent documented weight history in chart to determine how recently weight loss occurred. RD will add supplements and vitamins to help pt meet her estimated needs and to support wound healing. Per MD note, plan is for possible EGD/enteroscopy tomorrow. RD will attempt to obtain nutrition related history and exam at follow-up.   Medications reviewed and include: protonix, NaCl @75ml /hr  Labs reviewed: K 3.0(L) Hgb 11.2(L), Hct 32.9(L), MCV 70.1(L), MCH 23.9(L) cbgs- 113, 103, 139, 142 x 24 hrs AIC 6.7(H)- 07/27/19  NUTRITION - FOCUSED PHYSICAL EXAM: Unable to perform at this time   Diet Order:   Diet Order            Diet NPO time specified  Diet effective midnight           Diet Carb Modified Fluid consistency: Thin; Room service appropriate? Yes  Diet effective  now                EDUCATION NEEDS:   Not appropriate for education at this time  Skin:  Skin Assessment: Reviewed RN Assessment (Sacral:  8cm x 4cm x 2cm and left heel 2cm x 2cm x 0.1cm)  Last BM:  2/21- TYPE 2  Height:   Ht Readings from Last 1 Encounters:  08/06/20 5\' 3"  (1.6 m)    Weight:   Wt Readings from Last 1 Encounters:  08/09/20 55.2 kg    Ideal Body Weight:  52.2 kg  BMI:  Body mass index is 21.56 kg/m.  Estimated Nutritional Needs:   Kcal:  1500-1700kcal/day  Protein:  75-85g/day  Fluid:  1.5-1.7L/day  Koleen Distance MS, RD, LDN Please refer to [facility] for RD and/or RD on-call/weekend/after hours pager

## 2020-08-09 NOTE — Assessment & Plan Note (Signed)
-   history of gastric and duodenal AVMs. Odd response of Hgb 4 g/dL on admission s/p 2 units PRBC and repeat has been ~11-12 g/dL since.  - GI following; patient declined EGD on 2/21; repeat attempt to see if patient will agree on 2/22, if she declines again, then possible d/c home with repeat Hgb to assure stability  - follow up CBC in am

## 2020-08-09 NOTE — Assessment & Plan Note (Addendum)
-  A1c 6.7% on 07/27/2019 -CBGs have not been elevated.  Continue trending -Diet controlled diabetic

## 2020-08-09 NOTE — Assessment & Plan Note (Signed)
-  Echo 08/09/2017: EF 40 to 45%, diffuse hypokinesis, grade 1 diastolic dysfunction -No signs or symptoms of exacerbation -Continue Coreg

## 2020-08-09 NOTE — Assessment & Plan Note (Signed)
-  Patient bedbound at baseline.  She states she has 24/7 caregivers at home

## 2020-08-09 NOTE — Progress Notes (Signed)
PROGRESS NOTE    Jocelyn Sanchez   EGB:[id number]  DOB: 1956/04/05  DOA: 08/06/2020     3  PCP: Dixie Dials, MD  CC: Cough, weakness, low hemoglobin  Hospital Course: Jocelyn Sanchez is a 65 yo female with PMH gastric/duodenal AVMs, MS (bedbound), hx PE (on chronic Eliquis), lymphedema, HTN, DMII, chronic combined systolic diastolic CHF (EF 37-10%, Gr 1 DD on 08/09/17 echo) who presented with cough, weakness and found to have hemoglobin of 4 g/dL on admission.  She received 2 units PRBC and repeat hemoglobin was 12 g/dL. Of note, 4 days prior to admission hemoglobin had been checked on 08/03/2019 and was 11.7 g/dL at that time. Hemoglobin has remained stable since transfusion with ongoing trending.  GI was consulted and given her history of underlying AVMs, there is tentative plan for pursuing endoscopy when Jocelyn Sanchez is amenable (she refused intervention on 08/09/2020). She was also found to be positive for COVID-19 on screening on admission on 08/06/2020.  Due to minimal symptoms and requiring room air, she was not started on any treatment.  Eliquis has been on hold since admission.   Interval History:  Jocelyn Sanchez resting in bed in no distress this morning.  Overall feels essentially back to her normal baseline.  Had no concerns or questions this morning.  Understands plan is to continue trending hemoglobin and pursue further GI work-up. After I had seen Jocelyn Sanchez, it was reported that she declined EGD from GI today.  ROS: Constitutional: negative for chills and fevers, Respiratory: negative for cough, Cardiovascular: negative for chest pain and Gastrointestinal: negative for abdominal pain  Assessment & Plan: Acute blood loss anemia - history of gastric and duodenal AVMs. Odd response of Hgb 4 g/dL on admission s/p 2 units PRBC and repeat has been ~11-12 g/dL since.  - GI following; Jocelyn Sanchez declined EGD on 2/21; repeat attempt to see if Jocelyn Sanchez will agree on 2/22, if she declines again, then  possible d/c home with repeat Hgb to assure stability  - follow up CBC in am   Chronic combined systolic (congestive) and diastolic (congestive) heart failure ([place]) -Echo 08/09/2017: EF 40 to 45%, diffuse hypokinesis, grade 1 diastolic dysfunction -No signs or symptoms of exacerbation -Continue Coreg  COVID-19 virus infection -Jocelyn Sanchez asymptomatic and on room air since admission -Seen by PCCM initially.  Considered no indication for treatment  Hx of pulmonary embolus -Home Eliquis on hold for now until work-up complete  Diabetes mellitus with peripheral vascular disease ([place]) -A1c 6.7% on 07/27/2019 -CBGs have not been elevated.  Continue trending -Diet controlled diabetic  Multiple sclerosis ([place]) -Jocelyn Sanchez bedbound at baseline.  She states she has 24/7 caregivers at home  Acute kidney injury (HCC)-resolved as of 03/06/2015 -Considered prerenal due to volume loss -Responded well to fluids    Old records reviewed in assessment of this Jocelyn Sanchez  Antimicrobials: n/a  DVT prophylaxis: SCDs Start: 08/06/20 1740   Code Status:   Code Status: DNR Family Communication:   Disposition Plan: Status is: Inpatient  Remains inpatient appropriate because:Ongoing diagnostic testing needed not appropriate for outpatient work up, IV treatments appropriate due to intensity of illness or inability to take PO and Inpatient level of care appropriate due to severity of illness   Dispo: The Jocelyn Sanchez is from: Home              Anticipated d/c is to: Home              Anticipated d/c date is: 2 days  Jocelyn Sanchez currently is not medically stable to d/c.   Difficult to place Jocelyn Sanchez No  Risk of unplanned readmission score: Unplanned Admission- Pilot do not use: 11.84   Objective: Blood pressure (!) 92/44, pulse 85, temperature 98.6 F (37 C), temperature source Oral, resp. rate 16, height 5\' 3"  (1.6 m), weight 55.2 kg, SpO2 97 %.  Examination: General appearance: alert, cooperative  and no distress Head: Normocephalic, without obvious abnormality, atraumatic Eyes: EOMI Lungs: clear to auscultation bilaterally Heart: regular rate and rhythm and S1, S2 normal Abdomen: normal findings: bowel sounds normal and soft, non-tender Extremities: trace LE edema Skin: mobility and turgor normal Neurologic: able to move UE equally but weak; no motor strength in LE; sensation intact throughout   Consultants:   GI  Procedures:   n/a  Data Reviewed: I have personally reviewed following labs and imaging studies Results for orders placed or performed during the hospital encounter of 08/06/20 (from the past 24 hour(s))  Glucose, capillary     Status: Abnormal   Collection Time: 08/08/20  4:27 PM  Result Value Ref Range   Glucose-Capillary 166 (H) 70 - 99 mg/dL  Glucose, capillary     Status: Abnormal   Collection Time: 08/08/20  7:34 PM  Result Value Ref Range   Glucose-Capillary 157 (H) 70 - 99 mg/dL  Glucose, capillary     Status: Abnormal   Collection Time: 08/08/20 11:37 PM  Result Value Ref Range   Glucose-Capillary 136 (H) 70 - 99 mg/dL  Basic metabolic panel     Status: Abnormal   Collection Time: 08/09/20  2:26 AM  Result Value Ref Range   Sodium 135 135 - 145 mmol/L   Potassium 3.0 (L) 3.5 - 5.1 mmol/L   Chloride 96 (L) 98 - 111 mmol/L   CO2 28 22 - 32 mmol/L   Glucose, Bld 134 (H) 70 - 99 mg/dL   BUN 18 8 - 23 mg/dL   Creatinine, Ser 0.55 0.44 - 1.00 mg/dL   Calcium 6.9 (L) 8.9 - 10.3 mg/dL   GFR, Estimated >60 >60 mL/min   Anion gap 11 5 - 15  Glucose, capillary     Status: Abnormal   Collection Time: 08/09/20  4:25 AM  Result Value Ref Range   Glucose-Capillary 113 (H) 70 - 99 mg/dL  Glucose, capillary     Status: Abnormal   Collection Time: 08/09/20  8:03 AM  Result Value Ref Range   Glucose-Capillary 103 (H) 70 - 99 mg/dL   Comment 1 Notify RN    Comment 2 Document in Chart   Glucose, capillary     Status: Abnormal   Collection Time: 08/09/20  12:38 PM  Result Value Ref Range   Glucose-Capillary 139 (H) 70 - 99 mg/dL   Comment 1 Notify RN    Comment 2 Document in Chart     Recent Results (from the past 240 hour(s))  Resp Panel by RT-PCR (Flu A&B, Covid) Nasopharyngeal Swab     Status: Abnormal   Collection Time: 08/06/20  1:08 PM   Specimen: Nasopharyngeal Swab; Nasopharyngeal(NP) swabs in vial transport medium  Result Value Ref Range Status   SARS Coronavirus 2 by RT PCR POSITIVE (A) NEGATIVE Final    Comment: RESULT CALLED TO, READ BACK BY AND VERIFIED WITH: L HILL AT 1558 ON 08/06/2020 BY JPM (NOTE) SARS-CoV-2 target nucleic acids are DETECTED.  The SARS-CoV-2 RNA is generally detectable in upper respiratory specimens during the acute phase of infection. Positive results are indicative  of the presence of the identified virus, but do not rule out bacterial infection or co-infection with other pathogens not detected by the test. Clinical correlation with Jocelyn Sanchez history and other diagnostic information is necessary to determine Jocelyn Sanchez infection status. The expected result is Negative.  Fact Sheet for Patients: EntrepreneurPulse.com.au  Fact Sheet for Healthcare Providers: IncredibleEmployment.be  This test is not yet approved or cleared by the [place] FDA and  has been authorized for detection and/or diagnosis of SARS-CoV-2 by FDA under an Emergency Use Authorization (EUA).  This EUA will remain in effect (meaning this test can b e used) for the duration of  the COVID-19 declaration under Section 564(b)(1) of the Act, 21 U.S.C. section 360bbb-3(b)(1), unless the authorization is terminated or revoked sooner.     Influenza A by PCR NEGATIVE NEGATIVE Final   Influenza B by PCR NEGATIVE NEGATIVE Final    Comment: (NOTE) The Xpert Xpress SARS-CoV-2/FLU/RSV plus assay is intended as an aid in the diagnosis of influenza from Nasopharyngeal swab specimens and should not be  used as a sole basis for treatment. Nasal washings and aspirates are unacceptable for Xpert Xpress SARS-CoV-2/FLU/RSV testing.  Fact Sheet for Patients: EntrepreneurPulse.com.au  Fact Sheet for Healthcare Providers: IncredibleEmployment.be  This test is not yet approved or cleared by the [place] FDA and has been authorized for detection and/or diagnosis of SARS-CoV-2 by FDA under an Emergency Use Authorization (EUA). This EUA will remain in effect (meaning this test can be used) for the duration of the COVID-19 declaration under Section 564(b)(1) of the Act, 21 U.S.C. section 360bbb-3(b)(1), unless the authorization is terminated or revoked.  Performed at [facility], [place] [address], [place], [place] [address]   Blood Culture (routine x 2)     Status: None (Preliminary result)   Collection Time: 08/06/20  1:08 PM   Specimen: BLOOD  Result Value Ref Range Status   Specimen Description   Final    BLOOD RIGHT ARM Performed at [facility] [address], [place], [place] [address]    Special Requests   Final    BOTTLES DRAWN AEROBIC AND ANAEROBIC Blood Culture adequate volume Performed at [facility] [address], [place], [place] [address]    Culture   Final    NO GROWTH 3 DAYS Performed at [facility], [place] [address], [place], [place] [address]    Report Status PENDING  Incomplete  MRSA PCR Screening     Status: None   Collection Time: 08/06/20 10:34 PM   Specimen: Nasal Mucosa; Nasopharyngeal  Result Value Ref Range Status   MRSA by PCR NEGATIVE NEGATIVE Final    Comment:        The GeneXpert MRSA Assay (FDA approved for NASAL specimens only), is one component of a comprehensive MRSA colonization surveillance program. It is not intended to diagnose MRSA infection nor to guide or monitor treatment for MRSA infections. Performed at [facility], [place] [address], [place],  [address]      Radiology Studies: No results found. DG Chest Port 1 View  Final Result      Scheduled Meds: . carvedilol  6.25 mg Oral BID  . Gerhardt's butt cream   Topical TID  . mouth rinse  15 mL Mouth Rinse BID  . pantoprazole (PROTONIX) IV  40 mg Intravenous Q12H  . traZODone  100 mg Oral QHS   PRN Meds: acetaminophen, dextromethorphan, docusate sodium, ondansetron (ZOFRAN) IV,  polyethylene glycol Continuous Infusions: . sodium chloride 75 mL/hr at 08/09/20 0805     LOS: 3 days  Time spent: Greater than 50% of the 35 minute visit was spent in counseling/coordination of care for the Jocelyn Sanchez as laid out in the A&P.   Dwyane Dee, MD [facility] 08/09/2020, 2:06 PM

## 2020-08-09 NOTE — Progress Notes (Signed)
AM K+ 3.0 with creat 0.55 and GFR > 60. ELink CCM Electrolyte protocol initiated.

## 2020-08-09 NOTE — Assessment & Plan Note (Signed)
-  Considered prerenal due to volume loss -Responded well to fluids

## 2020-08-09 NOTE — Assessment & Plan Note (Signed)
-  Patient asymptomatic and on room air since admission -Seen by PCCM initially.  Considered no indication for treatment

## 2020-08-09 NOTE — H&P (View-Only) (Signed)
[facility] Progress Note  Jocelyn Sanchez 65 y.o. Jan 04, 1956  CC: Anemia  Subjective: Patient denies any complaints.  She states she wants to go home.  Denies nausea/vomiting, abdominal pain, melena, or hematochezia.    ROS : Review of Systems  Respiratory: Negative for cough and shortness of breath.   Gastrointestinal: Negative for abdominal pain, blood in stool, constipation, diarrhea, heartburn, melena, nausea and vomiting.   Objective: Vital signs in last 24 hours: Vitals:   08/09/20 0427 08/09/20 0745  BP:    Pulse:    Resp:    Temp: (!) 100.8 F (38.2 C) 98.8 F (37.1 C)  SpO2:      Physical Exam:  General:  Chronically ill-appearing, alert, cooperative, no distress  Head:  Normocephalic, without obvious abnormality, atraumatic  Eyes:  Anicteric sclera, EOMs intact  Lungs:   Clear to auscultation bilaterally, respirations unlabored  Heart:  Regular rate and rhythm, S1, S2 normal  Abdomen:   Soft, non-tender, bowel sounds active all four quadrants,  no masses,   Extremities: Thin, contractures (upper extremities), no  edema  Pulses: 2+ and symmetric    Lab Results: Recent Labs    08/08/20 0555 08/09/20 0226  NA 135 135  K 4.6 3.0*  CL 105 96*  CO2 19* 28  GLUCOSE 97 134*  BUN 25* 18  CREATININE 0.60 0.55  CALCIUM 7.6* 6.9*   Recent Labs    08/06/20 1308  AST 8*  ALT 6  ALKPHOS 82  BILITOT 0.8  PROT 7.8  ALBUMIN 2.9*   Recent Labs    08/06/20 1308 08/06/20 1637 08/07/20 1639 08/08/20 0555  WBC 9.9   < > 8.1 10.4  NEUTROABS 7.6  --   --   --   HGB 4.0*   < > 11.2* 11.2*  HCT 15.0*   < > 34.2* 32.9*  MCV 62.8*   < > 72.8* 70.1*  PLT 712*   < > 344 381   < > = values in this interval not displayed.   No results for input(s): LABPROT, INR in the last 72 hours.    Assessment: Anemia, history of duodenal AVMs.  Presentation concerning for ongoing, slow GI blood loss from AVMs. -Heme-negative brown stool -Hgb 11.2, stable  A  fib, on Eliquis  COVID-19 infection.  Tmax 100.85F this morning, now normal.  She is breathing comfortably on room air and without respiratory complaints  Multiple Sclerosis  Plan: Recommend EGD tomorrow (patient ate breakfast).  However, patient states she wants to go home and does not want EGD.  Advised patient she could be having bleeding AVMs, and EGD would allow for possible diagnosis/treatment.  She states she will think about it and let us know if she decides to proceed with EGD.  Continue supportive care and IV Protonix.  Continue to hold Eliquis.  Continue to trend CBC with transfusion as needed to maintain Hgb ~7.   Salley Slaughter PA-C 08/09/2020, 11:02 AM  Contact #  [phone]

## 2020-08-09 NOTE — Assessment & Plan Note (Signed)
-  Home Eliquis on hold for now until work-up complete

## 2020-08-10 ENCOUNTER — Inpatient Hospital Stay (HOSPITAL_COMMUNITY): Payer: Medicare Other | Admitting: Certified Registered"

## 2020-08-10 ENCOUNTER — Encounter (HOSPITAL_COMMUNITY)
Admission: EM | Disposition: A | Discharge status: Home Health | Payer: Self-pay | Source: Home / Self Care | Attending: Pulmonary Disease

## 2020-08-10 ENCOUNTER — Encounter (HOSPITAL_COMMUNITY): Payer: Self-pay | Admitting: Pulmonary Disease

## 2020-08-10 ENCOUNTER — Inpatient Hospital Stay (HOSPITAL_COMMUNITY): Payer: Medicare Other

## 2020-08-10 DIAGNOSIS — I5042 Chronic combined systolic (congestive) and diastolic (congestive) heart failure: Secondary | ICD-10-CM

## 2020-08-10 DIAGNOSIS — E1151 Type 2 diabetes mellitus with diabetic peripheral angiopathy without gangrene: Secondary | ICD-10-CM

## 2020-08-10 DIAGNOSIS — G35 Multiple sclerosis: Secondary | ICD-10-CM

## 2020-08-10 HISTORY — PX: ENTEROSCOPY: SHX5533

## 2020-08-10 HISTORY — PX: HOT HEMOSTASIS: SHX5433

## 2020-08-10 LAB — CBC WITH DIFFERENTIAL/PLATELET
Abs Immature Granulocytes: 0.17 10*3/uL — ABNORMAL HIGH (ref 0.00–0.07)
Basophils Absolute: 0 10*3/uL (ref 0.0–0.1)
Basophils Relative: 0 %
Eosinophils Absolute: 0.2 10*3/uL (ref 0.0–0.5)
Eosinophils Relative: 1 %
HCT: 32.8 % — ABNORMAL LOW (ref 36.0–46.0)
Hemoglobin: 10.3 g/dL — ABNORMAL LOW (ref 12.0–15.0)
Immature Granulocytes: 1 %
Lymphocytes Relative: 10 %
Lymphs Abs: 1.3 10*3/uL (ref 0.7–4.0)
MCH: 23.5 pg — ABNORMAL LOW (ref 26.0–34.0)
MCHC: 31.4 g/dL (ref 30.0–36.0)
MCV: 74.9 fL — ABNORMAL LOW (ref 80.0–100.0)
Monocytes Absolute: 1.5 10*3/uL — ABNORMAL HIGH (ref 0.1–1.0)
Monocytes Relative: 12 %
Neutro Abs: 10.2 10*3/uL — ABNORMAL HIGH (ref 1.7–7.7)
Neutrophils Relative %: 76 %
Platelets: 353 10*3/uL (ref 150–400)
RBC: 4.38 MIL/uL (ref 3.87–5.11)
RDW: 24.6 % — ABNORMAL HIGH (ref 11.5–15.5)
WBC: 13.3 10*3/uL — ABNORMAL HIGH (ref 4.0–10.5)
nRBC: 0.3 % — ABNORMAL HIGH (ref 0.0–0.2)

## 2020-08-10 LAB — BASIC METABOLIC PANEL
Anion gap: 12 (ref 5–15)
BUN: 15 mg/dL (ref 8–23)
CO2: 27 mmol/L (ref 22–32)
Calcium: 6.8 mg/dL — ABNORMAL LOW (ref 8.9–10.3)
Chloride: 97 mmol/L — ABNORMAL LOW (ref 98–111)
Creatinine, Ser: 0.47 mg/dL (ref 0.44–1.00)
GFR, Estimated: >60 mL/min (ref >60)
Glucose, Bld: 173 mg/dL — ABNORMAL HIGH (ref 70–99)
Potassium: 3.6 mmol/L (ref 3.5–5.1)
Sodium: 136 mmol/L (ref 135–145)

## 2020-08-10 LAB — GLUCOSE, CAPILLARY
Glucose-Capillary: 134 mg/dL — ABNORMAL HIGH (ref 70–99)
Glucose-Capillary: 137 mg/dL — ABNORMAL HIGH (ref 70–99)
Glucose-Capillary: 163 mg/dL — ABNORMAL HIGH (ref 70–99)
Glucose-Capillary: 147 mg/dL — ABNORMAL HIGH (ref 70–99)

## 2020-08-10 LAB — MAGNESIUM: Magnesium: 0.9 mg/dL — CL (ref 1.7–2.4)

## 2020-08-10 SURGERY — ENTEROSCOPY
Anesthesia: Monitor Anesthesia Care

## 2020-08-10 MED ORDER — ACETAMINOPHEN 500 MG PO TABS
1000.0000 mg | ORAL_TABLET | Freq: Two times a day (BID) | ORAL | Status: DC
  Administered 2020-08-10 – 2020-08-12 (×4): 1000 mg via ORAL
  Filled 2020-08-10 (×4): qty 2

## 2020-08-10 MED ORDER — SACUBITRIL-VALSARTAN 24-26 MG PO TABS
1.0000 | ORAL_TABLET | Freq: Two times a day (BID) | ORAL | Status: DC
  Administered 2020-08-10 – 2020-08-12 (×4): 1 via ORAL
  Filled 2020-08-10 (×6): qty 1

## 2020-08-10 MED ORDER — GUAIFENESIN 100 MG/5ML PO SYRP
200.0000 mg | ORAL_SOLUTION | Freq: Three times a day (TID) | ORAL | Status: DC | PRN
  Filled 2020-08-10: qty 10

## 2020-08-10 MED ORDER — LACTULOSE 10 GM/15ML PO SOLN: 20.0000 g | Freq: Every day | ORAL | Status: DC | PRN

## 2020-08-10 MED ORDER — CHOLECALCIFEROL 50 MCG (2000 UT) PO CAPS: 2000.0000 [IU] | ORAL_CAPSULE | Freq: Every day | ORAL | Status: DC

## 2020-08-10 MED ORDER — VITAMIN D 25 MCG (1000 UNIT) PO TABS
2000.0000 [IU] | ORAL_TABLET | Freq: Every day | ORAL | Status: DC
  Administered 2020-08-11 – 2020-08-12 (×2): 2000 [IU] via ORAL
  Filled 2020-08-10 (×2): qty 2

## 2020-08-10 MED ORDER — TRAZODONE HCL 100 MG PO TABS
100.0000 mg | ORAL_TABLET | Freq: Every day | ORAL | Status: DC
Start: 2020-08-10 — End: 2020-08-10

## 2020-08-10 MED ORDER — ZINC SULFATE 220 (50 ZN) MG PO TABS: 220.0000 mg | ORAL_TABLET | Freq: Every day | ORAL | Status: DC

## 2020-08-10 MED ORDER — SODIUM CHLORIDE 0.9 % IV SOLN: INTRAVENOUS | Status: DC

## 2020-08-10 MED ORDER — METFORMIN HCL 500 MG PO TABS
500.0000 mg | ORAL_TABLET | Freq: Two times a day (BID) | ORAL | Status: DC
  Administered 2020-08-10 – 2020-08-12 (×5): 500 mg via ORAL
  Filled 2020-08-10 (×5): qty 1

## 2020-08-10 MED ORDER — PANTOPRAZOLE SODIUM 40 MG PO TBEC: 40.0000 mg | DELAYED_RELEASE_TABLET | Freq: Every day | ORAL | Status: DC

## 2020-08-10 MED ORDER — DEXAMETHASONE 6 MG PO TABS
6.0000 mg | ORAL_TABLET | Freq: Every day | ORAL | Status: DC
  Administered 2020-08-11: 6 mg via ORAL
  Filled 2020-08-10: qty 1

## 2020-08-10 MED ORDER — METHOCARBAMOL 750 MG PO TABS
750.0000 mg | ORAL_TABLET | Freq: Two times a day (BID) | ORAL | Status: DC
  Administered 2020-08-10 – 2020-08-12 (×4): 750 mg via ORAL
  Filled 2020-08-10 (×5): qty 1

## 2020-08-10 MED ORDER — ASCORBIC ACID 500 MG PO TABS
1000.0000 mg | ORAL_TABLET | Freq: Every day | ORAL | Status: DC
Start: 2020-08-10 — End: 2020-08-10

## 2020-08-10 MED ORDER — APIXABAN 5 MG PO TABS
5.0000 mg | ORAL_TABLET | Freq: Two times a day (BID) | ORAL | Status: DC
  Administered 2020-08-11 – 2020-08-12 (×3): 5 mg via ORAL
  Filled 2020-08-10 (×3): qty 1

## 2020-08-10 MED ORDER — ZINC SULFATE 220 (50 ZN) MG PO CAPS
220.0000 mg | ORAL_CAPSULE | Freq: Every day | ORAL | Status: DC
  Administered 2020-08-11 – 2020-08-12 (×2): 220 mg via ORAL
  Filled 2020-08-10 (×2): qty 1

## 2020-08-10 MED ORDER — PROPOFOL 500 MG/50ML IV EMUL
INTRAVENOUS | Status: DC | PRN
  Administered 2020-08-10: 100 ug/kg/min via INTRAVENOUS

## 2020-08-10 MED ORDER — ASCORBIC ACID 500 MG PO TABS
1000.0000 mg | ORAL_TABLET | Freq: Every day | ORAL | Status: DC
  Administered 2020-08-11 – 2020-08-12 (×2): 1000 mg via ORAL
  Filled 2020-08-10 (×2): qty 2

## 2020-08-10 MED ORDER — MAGNESIUM SULFATE 4 GM/100ML IV SOLN
4.0000 g | Freq: Once | INTRAVENOUS | Status: AC
  Administered 2020-08-10: 4 g via INTRAVENOUS
  Filled 2020-08-10: qty 100

## 2020-08-10 MED ORDER — PANTOPRAZOLE SODIUM 40 MG IV SOLR
40.0000 mg | INTRAVENOUS | Status: DC
  Administered 2020-08-11 – 2020-08-12 (×2): 40 mg via INTRAVENOUS
  Filled 2020-08-10 (×2): qty 40

## 2020-08-10 NOTE — Anesthesia Postprocedure Evaluation (Signed)
Anesthesia Post Note  Patient: KARITA DRALLE  Procedure(s) Performed: ENTEROSCOPY (N/A ) HOT HEMOSTASIS (ARGON PLASMA COAGULATION/BICAP) (N/A )     Patient location during evaluation: PACU Anesthesia Type: MAC Level of consciousness: awake and alert Pain management: pain level controlled Vital Signs Assessment: post-procedure vital signs reviewed and stable Respiratory status: spontaneous breathing and respiratory function stable Cardiovascular status: stable Postop Assessment: no apparent nausea or vomiting Anesthetic complications: no   No complications documented.  Last Vitals:  Vitals:   08/10/20 1329 08/10/20 1340  BP: (!) 114/52 (!) 129/92  Pulse: 90 95  Resp: 12 15  Temp: (!) 35.9 C   SpO2: 98% 97%    Last Pain:  Vitals:   08/10/20 1340  TempSrc:   PainSc: 0-No pain                 Irie Dowson DANIEL

## 2020-08-10 NOTE — Transfer of Care (Signed)
Immediate Anesthesia Transfer of Care Note  Patient: Jocelyn Sanchez  Procedure(s) Performed: ENTEROSCOPY (N/A ) HOT HEMOSTASIS (ARGON PLASMA COAGULATION/BICAP) (N/A )  Patient Location: PACU and Endoscopy Unit  Anesthesia Type:MAC  Level of Consciousness: awake, alert  and patient cooperative  Airway & Oxygen Therapy: Patient Spontanous Breathing and Patient connected to face mask oxygen  Post-op Assessment: Report given to RN and Post -op Vital signs reviewed and stable  Post vital signs: Reviewed and stable  Last Vitals:  Vitals Value Taken Time  BP    Temp    Pulse    Resp    SpO2      Last Pain:  Vitals:   08/10/20 1256  TempSrc: Oral  PainSc: 6       Patients Stated Pain Goal: 2 (31/28/11 8867)  Complications: No complications documented.

## 2020-08-10 NOTE — Anesthesia Procedure Notes (Signed)
Procedure Name: MAC Date/Time: 08/10/2020 12:59 PM Performed by: Eben Burow, CRNA Pre-anesthesia Checklist: Patient identified, Emergency Drugs available, Suction available, Patient being monitored and Timeout performed Oxygen Delivery Method: Simple face mask Placement Confirmation: positive ETCO2

## 2020-08-10 NOTE — Anesthesia Preprocedure Evaluation (Addendum)
Anesthesia Evaluation  Patient identified by MRN, date of birth, ID band Patient awake    Reviewed: Allergy & Precautions, NPO status , Patient's Chart, lab work & pertinent test results, reviewed documented beta blocker date and time   Airway Mallampati: II  TM Distance: >3 FB Neck ROM: Full    Dental  (+) Dental Advisory Given, Partial Upper   Pulmonary former smoker, PE found to be positive for COVID-19 on screening on admission on 08/06/2020   Pulmonary exam normal breath sounds clear to auscultation       Cardiovascular hypertension, Pt. on home beta blockers and Pt. on medications + Peripheral Vascular Disease and +CHF  Normal cardiovascular exam Rhythm:Regular Rate:Normal  EF 40-45%, Gr 1 DD on 08/09/17 echo   Neuro/Psych Multiple sclerosis  negative psych ROS   GI/Hepatic Neg liver ROS, GERD  Medicated,  Endo/Other  diabetes, Type 2  Renal/GU negative Renal ROS     Musculoskeletal negative musculoskeletal ROS (+)   Abdominal   Peds  Hematology  (+) Blood dyscrasia (Eliquis), anemia ,   Anesthesia Other Findings   Reproductive/Obstetrics                           Anesthesia Physical Anesthesia Plan  ASA: IV  Anesthesia Plan: MAC   Post-op Pain Management:    Induction: Intravenous  PONV Risk Score and Plan: 2 and Propofol infusion and Treatment may vary due to age or medical condition  Airway Management Planned: Nasal Cannula and Natural Airway  Additional Equipment:   Intra-op Plan:   Post-operative Plan:   Informed Consent: I have reviewed the patients History and Physical, chart, labs and discussed the procedure including the risks, benefits and alternatives for the proposed anesthesia with the patient or authorized representative who has indicated his/her understanding and acceptance.   Patient has DNR.  Discussed DNR with patient and Continue DNR.   Dental  advisory given  Plan Discussed with: CRNA and Anesthesiologist  Anesthesia Plan Comments:       Anesthesia Quick Evaluation

## 2020-08-10 NOTE — Progress Notes (Signed)
PROGRESS NOTE    Jocelyn Sanchez   KWI:[id number]  DOB: 12/10/1955  DOA: 08/06/2020     4  PCP: Dixie Dials, MD  CC: Cough, weakness, low hemoglobin  Hospital Course: Jocelyn Sanchez is a 65 yo female with PMH gastric/duodenal AVMs, MS (bedbound), hx PE (on chronic Eliquis), lymphedema, HTN, DMII, chronic combined systolic diastolic CHF (EF 24-26%, Gr 1 DD on 08/09/17 echo) who presented with cough, weakness and found to have hemoglobin of 4 g/dL on admission.  She received 2 units PRBC and repeat hemoglobin was 12 g/dL. Of note, 4 days prior to admission hemoglobin had been checked on 08/03/2019 and was 11.7 g/dL at that time. Hemoglobin has remained stable since transfusion with ongoing trending.  GI was consulted and given her history of underlying AVMs, there is tentative plan for pursuing endoscopy when patient is amenable (she refused intervention on 08/09/2020). She was also found to be positive for COVID-19 on screening on admission on 08/06/2020.  Due to minimal symptoms and requiring room air, she was not started on any treatment.  Eliquis has been on hold since admission.   Interval History:  Patient denies any new complaints, no noted melena or hematochezia, denies any abdominal pain.  Just reports chronic back pain   Assessment & Plan:  Acute blood loss anemia likely 2/2 small bowel AVMs Odd response of Hgb 4 g/dL on admission s/p 2 units PRBC and repeat has been ~11-12 g/dL GI consulted s/p small bowel enteroscopy on 08/10/20 which showed small bowel AVMs s/p treated with APC Daily CBC  Chronic combined systolic and diastolic CHF Echo 8/34/1962: EF 40 to 45%, diffuse hypokinesis, grade 1 diastolic dysfunctio No signs or symptoms of exacerbation Continue Coreg  COVID-19 virus infection Patient asymptomatic and on room air since admission Seen by PCCM initially.  Considered no indication for treatment  Leukocytosis Currently afebrile UC pending Chest x-ray showed  possible bibasilar atelectasis and infiltrates, currently saturating well on room air Daily CBC for now, no indication to treat  Hypomagnesemia Replace as needed  Hx of pulmonary embolus Resume Eliquis on 08/11/2021 as per GI  Diabetes mellitus with peripheral vascular disease A1c 6.7% on 07/27/2019 Diet controlled diabetic  Multiple sclerosis Patient bedbound at baseline.  She states she has 24/7 caregivers at home     Antimicrobials: n/a  DVT prophylaxis: SCDs Start: 08/06/20 1740 apixaban (ELIQUIS) tablet 5 mg   Code Status:   Code Status: DNR Family Communication: None  Disposition Plan: Status is: Inpatient  Remains inpatient appropriate because:Ongoing diagnostic testing needed not appropriate for outpatient work up, IV treatments appropriate due to intensity of illness or inability to take PO and Inpatient level of care appropriate due to severity of illness   Dispo: The patient is from: Home              Anticipated d/c is to: Home              Anticipated d/c date is: 1 day              Patient currently is not medically stable to d/c.   Difficult to place patient No    Objective: Blood pressure (!) 143/40, pulse 87, temperature 97.6 F (36.4 C), temperature source Axillary, resp. rate 15, height 5\' 3"  (1.6 m), weight 54.8 kg, SpO2 100 %.   Examination:  General: NAD, bedbound   Cardiovascular: S1, S2 present  Respiratory: CTAB  Abdomen: Soft, nontender, nondistended, bowel sounds present  Musculoskeletal: No  bilateral pedal edema noted  Skin: Normal  Psychiatry: Normal mood    Consultants:   GI  Procedures:   Small bowel enteroscopy  Data Reviewed: I have personally reviewed following labs and imaging studies Results for orders placed or performed during the hospital encounter of 08/06/20 (from the past 24 hour(s))  Glucose, capillary     Status: Abnormal   Collection Time: 08/09/20  9:23 PM  Result Value Ref Range    Glucose-Capillary 117 (H) 70 - 99 mg/dL  Basic metabolic panel     Status: Abnormal   Collection Time: 08/10/20  2:57 AM  Result Value Ref Range   Sodium 136 135 - 145 mmol/L   Potassium 3.6 3.5 - 5.1 mmol/L   Chloride 97 (L) 98 - 111 mmol/L   CO2 27 22 - 32 mmol/L   Glucose, Bld 173 (H) 70 - 99 mg/dL   BUN 15 8 - 23 mg/dL   Creatinine, Ser 0.47 0.44 - 1.00 mg/dL   Calcium 6.8 (L) 8.9 - 10.3 mg/dL   GFR, Estimated >60 >60 mL/min   Anion gap 12 5 - 15  CBC with Differential/Platelet     Status: Abnormal   Collection Time: 08/10/20  2:57 AM  Result Value Ref Range   WBC 13.3 (H) 4.0 - 10.5 K/uL   RBC 4.38 3.87 - 5.11 MIL/uL   Hemoglobin 10.3 (L) 12.0 - 15.0 g/dL   HCT 32.8 (L) 36.0 - 46.0 %   MCV 74.9 (L) 80.0 - 100.0 fL   MCH 23.5 (L) 26.0 - 34.0 pg   MCHC 31.4 30.0 - 36.0 g/dL   RDW 24.6 (H) 11.5 - 15.5 %   Platelets 353 150 - 400 K/uL   nRBC 0.3 (H) 0.0 - 0.2 %   Neutrophils Relative % 76 %   Neutro Abs 10.2 (H) 1.7 - 7.7 K/uL   Lymphocytes Relative 10 %   Lymphs Abs 1.3 0.7 - 4.0 K/uL   Monocytes Relative 12 %   Monocytes Absolute 1.5 (H) 0.1 - 1.0 K/uL   Eosinophils Relative 1 %   Eosinophils Absolute 0.2 0.0 - 0.5 K/uL   Basophils Relative 0 %   Basophils Absolute 0.0 0.0 - 0.1 K/uL   Immature Granulocytes 1 %   Abs Immature Granulocytes 0.17 (H) 0.00 - 0.07 K/uL   Schistocytes PRESENT    Burr Cells PRESENT    Ovalocytes PRESENT   Magnesium     Status: Abnormal   Collection Time: 08/10/20  2:57 AM  Result Value Ref Range   Magnesium 0.9 (LL) 1.7 - 2.4 mg/dL  Glucose, capillary     Status: Abnormal   Collection Time: 08/10/20  8:49 AM  Result Value Ref Range   Glucose-Capillary 134 (H) 70 - 99 mg/dL   Comment 1 Notify RN    Comment 2 Document in Chart   Glucose, capillary     Status: Abnormal   Collection Time: 08/10/20 11:17 AM  Result Value Ref Range   Glucose-Capillary 137 (H) 70 - 99 mg/dL   Comment 1 Notify RN    Comment 2 Document in Chart   Glucose,  capillary     Status: Abnormal   Collection Time: 08/10/20  5:05 PM  Result Value Ref Range   Glucose-Capillary 163 (H) 70 - 99 mg/dL   Comment 1 Notify RN    Comment 2 Document in Chart     Recent Results (from the past 240 hour(s))  Resp Panel by RT-PCR (Flu A&B, Covid)  Nasopharyngeal Swab     Status: Abnormal   Collection Time: 08/06/20  1:08 PM   Specimen: Nasopharyngeal Swab; Nasopharyngeal(NP) swabs in vial transport medium  Result Value Ref Range Status   SARS Coronavirus 2 by RT PCR POSITIVE (A) NEGATIVE Final    Comment: RESULT CALLED TO, READ BACK BY AND VERIFIED WITH: L HILL AT 1558 ON 08/06/2020 BY JPM (NOTE) SARS-CoV-2 target nucleic acids are DETECTED.  The SARS-CoV-2 RNA is generally detectable in upper respiratory specimens during the acute phase of infection. Positive results are indicative of the presence of the identified virus, but do not rule out bacterial infection or co-infection with other pathogens not detected by the test. Clinical correlation with patient history and other diagnostic information is necessary to determine patient infection status. The expected result is Negative.  Fact Sheet for Patients: EntrepreneurPulse.com.au  Fact Sheet for Healthcare Providers: IncredibleEmployment.be  This test is not yet approved or cleared by the [place] FDA and  has been authorized for detection and/or diagnosis of SARS-CoV-2 by FDA under an Emergency Use Authorization (EUA).  This EUA will remain in effect (meaning this test can b e used) for the duration of  the COVID-19 declaration under Section 564(b)(1) of the Act, 21 U.S.C. section 360bbb-3(b)(1), unless the authorization is terminated or revoked sooner.     Influenza A by PCR NEGATIVE NEGATIVE Final   Influenza B by PCR NEGATIVE NEGATIVE Final    Comment: (NOTE) The Xpert Xpress SARS-CoV-2/FLU/RSV plus assay is intended as an aid in the diagnosis of  influenza from Nasopharyngeal swab specimens and should not be used as a sole basis for treatment. Nasal washings and aspirates are unacceptable for Xpert Xpress SARS-CoV-2/FLU/RSV testing.  Fact Sheet for Patients: EntrepreneurPulse.com.au  Fact Sheet for Healthcare Providers: IncredibleEmployment.be  This test is not yet approved or cleared by the [place] FDA and has been authorized for detection and/or diagnosis of SARS-CoV-2 by FDA under an Emergency Use Authorization (EUA). This EUA will remain in effect (meaning this test can be used) for the duration of the COVID-19 declaration under Section 564(b)(1) of the Act, 21 U.S.C. section 360bbb-3(b)(1), unless the authorization is terminated or revoked.  Performed at [facility] [place] [address] [place], [place] [address]   Blood Culture (routine x 2)     Status: None (Preliminary result)   Collection Time: 08/06/20  1:08 PM   Specimen: BLOOD  Result Value Ref Range Status   Specimen Description   Final    BLOOD RIGHT ARM Performed at [place] [address] [place], [place] [address]    Special Requests   Final    BOTTLES DRAWN AEROBIC AND ANAEROBIC Blood Culture adequate volume Performed at [facility] [address] [place], [place] [address]    Culture   Final    NO GROWTH 4 DAYS Performed at [facility], [place] [address], [place], [place] [address]    Report Status PENDING  Incomplete  MRSA PCR Screening     Status: None   Collection Time: 08/06/20 10:34 PM   Specimen: Nasal Mucosa; Nasopharyngeal  Result Value Ref Range Status   MRSA by PCR NEGATIVE NEGATIVE Final    Comment:        The GeneXpert MRSA Assay (FDA approved for NASAL specimens only), is one component of a comprehensive MRSA colonization surveillance program. It is not intended to diagnose MRSA infection nor to guide or monitor  treatment for MRSA  infections. Performed at [facility], [place] [address], [place], [place] [address]      Radiology Studies: [facility] Chest Port 1 View  Result Date: 08/10/2020 CLINICAL DATA:  Leukocytosis. EXAM: PORTABLE CHEST 1 VIEW COMPARISON:  08/06/2020. FINDINGS: Mediastinum and hilar structures normal. Heart size normal. Low lung volumes with bibasilar atelectasis and infiltrates. Small left pleural effusion cannot be excluded. Biapical pleural thickening consistent with scarring. No pneumothorax. IMPRESSION: Low lung volumes with bibasilar atelectasis and infiltrates. Small left pleural effusion cannot be excluded. Electronically Signed   By: Marcello Moores  Register   On: 08/10/2020 08:18   DG Chest Port 1 View  Final Result    DG Chest Port 1 View  Final Result      Scheduled Meds: . acetaminophen  1,000 mg Oral BID  . [START ON 08/11/2020] apixaban  5 mg Oral BID  . [START ON 08/11/2020] vitamin C  1,000 mg Oral Daily  . carvedilol  6.25 mg Oral BID  . [START ON 08/11/2020] cholecalciferol  2,000 Units Oral Daily  . [START ON 08/11/2020] dexamethasone  6 mg Oral Daily  . diclofenac Sodium  2 g Topical QID  . feeding supplement (NEPRO CARB STEADY)  237 mL Oral TID BM  . Gerhardt's butt cream   Topical TID  . mouth rinse  15 mL Mouth Rinse BID  . metFORMIN  500 mg Oral BID WC  . methocarbamol  750 mg Oral BID  . multivitamin with minerals  1 tablet Oral Daily  . [START ON 08/11/2020] pantoprazole (PROTONIX) IV  40 mg Intravenous Q24H  . sacubitril-valsartan  1 tablet Oral BID  . traZODone  100 mg Oral QHS  . [START ON 08/11/2020] zinc sulfate  220 mg Oral Daily   PRN Meds: acetaminophen, dextromethorphan, docusate sodium, guaifenesin, hydrOXYzine, lactulose, ondansetron (ZOFRAN) IV, polyethylene glycol Continuous Infusions: . sodium chloride Stopped (08/10/20 1334)     LOS: 4 days    Alma Friendly, MD [facility] 08/10/2020, 7:37 PM

## 2020-08-10 NOTE — Brief Op Note (Signed)
08/06/2020 - 08/10/2020  1:34 PM  PATIENT:  Jocelyn Sanchez  65 y.o. female  PRE-OPERATIVE DIAGNOSIS:  anemia, history of duodenal AVMs  POST-OPERATIVE DIAGNOSIS:  Small bowel AVMS APC  PROCEDURE:  Procedure(s): ENTEROSCOPY (N/A) HOT HEMOSTASIS (ARGON PLASMA COAGULATION/BICAP) (N/A)  SURGEON:  Surgeon(s) and Role:    Ronnette Juniper, MD - Primary  PHYSICIAN ASSISTANT:   ASSISTANTS: Arcola Jansky   ANESTHESIA:   MAC  EBL:  0 mL   BLOOD ADMINISTERED:none  DRAINS: none   LOCAL MEDICATIONS USED:  NONE  SPECIMEN:  No Specimen  DISPOSITION OF SPECIMEN:  N/A  COUNTS:  YES  TOURNIQUET:  * No tourniquets in log *  DICTATION: .Dragon Dictation  PLAN OF CARE: Admit to inpatient   PATIENT DISPOSITION:  PACU - hemodynamically stable.   Delay start of Pharmacological VTE agent (>24hrs) due to surgical blood loss or risk of bleeding: yes

## 2020-08-10 NOTE — Interval H&P Note (Signed)
History and Physical Interval Note: 64/female with anemia, history of duodenal AVMs, was on Eliquis, was dose of 08/06/20 for an EGD/enteroscopy with propofol. Consent was obtained from the patient(verbal) and her son Kleo Dungee over the phone([phone]).  08/10/2020 12:47 PM  Jocelyn Sanchez  has presented today for EGD/enteroscopy, with the diagnosis of anemia, history of duodenal AVMs.  The various methods of treatment have been discussed with the patient and family. After consideration of risks, benefits and other options for treatment, the patient has consented to  Procedure(s): ENTEROSCOPY (N/A) as a surgical intervention.  The patient's history has been reviewed, patient examined, no change in status, stable for surgery.  I have reviewed the patient's chart and labs.  Questions were answered to the patient's satisfaction.     Ronnette Juniper

## 2020-08-10 NOTE — Op Note (Signed)
[facility] [name] Procedure Date: 08/10/2020 MRN: [id number] Attending MD: Ronnette Juniper , MD Date of Birth: 09-13-1955 CSN: [id number] Age: 65 Admit Type: Inpatient Procedure:                Small bowel enteroscopy Indications:              Recurrent gastrointestinal bleeding Providers:                Ronnette Juniper, MD, Baird Cancer, RN, Tyna Jaksch                            Technician Referring MD:             [facility] Medicines:                Monitored Anesthesia Care Complications:            No immediate complications. Estimated Blood Loss:     Estimated blood loss: none. Procedure:                Pre-Anesthesia Assessment:                           - Prior to the procedure, a History and Physical                            was performed, and patient medications and                            allergies were reviewed. The patient's tolerance of                            previous anesthesia was also reviewed. The risks                            and benefits of the procedure and the sedation                            options and risks were discussed with the patient.                            All questions were answered, and informed consent                            was obtained. Prior Anticoagulants: The patient has                            taken Eliquis (apixaban), last dose was 4 days                            prior to procedure. ASA Grade Assessment: III - A                            patient with severe systemic disease. After  reviewing the risks and benefits, the patient was                            deemed in satisfactory condition to undergo the                            procedure.                           After obtaining informed consent, the endoscope was                            passed under direct vision. Throughout the                            procedure, the patient's blood  pressure, pulse, and                            oxygen saturations were monitored continuously. The                            [id number]-PH190L ([id number]) Olympus ultra slim endoscope                            was introduced through the mouth and advanced to                            the proximal jejunum. The small bowel enteroscopy                            was accomplished without difficulty. The patient                            tolerated the procedure well. Scope In: Scope Out: Findings:      A widely patent Schatzki's ring was noted. A small hiatal hernia was       noted.The esophagus was otherwise normal.      The stomach was normal.      Two angioectasias with no bleeding were found in the fourth portion of       the duodenum. Coagulation for tissue destruction using argon plasma at       0.5 liters/minute and 20 watts was successful.      There was no evidence of significant pathology in the proximal jejunum. Impression:               - Widely patent Schatzki's ring, small hiatal                            hernia otherwise normal esophagus.                           - Normal stomach.                           - Two non-bleeding angioectasias in the duodenum.  Treated with argon plasma coagulation (APC).                           - The examined portion of the jejunum was normal.                           - No specimens collected. Moderate Sedation:      Patient did not receive moderate sedation for this procedure, but       instead received monitored anesthesia care. Recommendation:           - Advance diet as tolerated.                           - Resume Eliquis (apixaban) at prior dose tomorrow                            if needed. Refer to primary physician for further                            adjustment of therapy. Procedure Code(s):        --- Professional ---                           [phone], Small intestinal endoscopy, enteroscopy                             beyond second portion of duodenum, not including                            ileum; with ablation of tumor(s), polyp(s), or                            other lesion(s) not amenable to removal by hot                            biopsy forceps, bipolar cautery or snare technique Diagnosis Code(s):        --- Professional ---                           N02.725, Angiodysplasia of stomach and duodenum                            without bleeding                           K92.2, Gastrointestinal hemorrhage, unspecified CPT copyright 2019 [employer]. All rights reserved. The codes documented in this report are preliminary and upon coder review may  be revised to meet current compliance requirements. Ronnette Juniper, MD 08/10/2020 1:34:18 PM This report has been signed electronically. Number of Addenda: 0

## 2020-08-11 ENCOUNTER — Encounter (HOSPITAL_COMMUNITY): Payer: Self-pay | Admitting: Gastroenterology

## 2020-08-11 LAB — CBC WITH DIFFERENTIAL/PLATELET
Abs Immature Granulocytes: 0.14 10*3/uL — ABNORMAL HIGH (ref 0.00–0.07)
Basophils Absolute: 0 10*3/uL (ref 0.0–0.1)
Basophils Relative: 0 %
Eosinophils Absolute: 0.3 10*3/uL (ref 0.0–0.5)
Eosinophils Relative: 3 %
HCT: 36.6 % (ref 36.0–46.0)
Hemoglobin: 11.4 g/dL — ABNORMAL LOW (ref 12.0–15.0)
Immature Granulocytes: 1 %
Lymphocytes Relative: 14 %
Lymphs Abs: 1.4 10*3/uL (ref 0.7–4.0)
MCH: 23.6 pg — ABNORMAL LOW (ref 26.0–34.0)
MCHC: 31.1 g/dL (ref 30.0–36.0)
MCV: 75.6 fL — ABNORMAL LOW (ref 80.0–100.0)
Monocytes Absolute: 1 10*3/uL (ref 0.1–1.0)
Monocytes Relative: 10 %
Neutro Abs: 7.1 10*3/uL (ref 1.7–7.7)
Neutrophils Relative %: 72 %
Platelets: 396 10*3/uL (ref 150–400)
RBC: 4.84 MIL/uL (ref 3.87–5.11)
RDW: 25.2 % — ABNORMAL HIGH (ref 11.5–15.5)
WBC: 9.9 10*3/uL (ref 4.0–10.5)
nRBC: 0.3 % — ABNORMAL HIGH (ref 0.0–0.2)

## 2020-08-11 LAB — GLUCOSE, CAPILLARY
Glucose-Capillary: 145 mg/dL — ABNORMAL HIGH (ref 70–99)
Glucose-Capillary: 147 mg/dL — ABNORMAL HIGH (ref 70–99)
Glucose-Capillary: 203 mg/dL — ABNORMAL HIGH (ref 70–99)

## 2020-08-11 LAB — BASIC METABOLIC PANEL
Anion gap: 12 (ref 5–15)
BUN: 18 mg/dL (ref 8–23)
CO2: 25 mmol/L (ref 22–32)
Calcium: 7.1 mg/dL — ABNORMAL LOW (ref 8.9–10.3)
Chloride: 98 mmol/L (ref 98–111)
Creatinine, Ser: 0.48 mg/dL (ref 0.44–1.00)
GFR, Estimated: >60 mL/min (ref >60)
Glucose, Bld: 154 mg/dL — ABNORMAL HIGH (ref 70–99)
Potassium: 3.1 mmol/L — ABNORMAL LOW (ref 3.5–5.1)
Sodium: 135 mmol/L (ref 135–145)

## 2020-08-11 LAB — CULTURE, BLOOD (ROUTINE X 2)
Culture: NO GROWTH
Special Requests: ADEQUATE

## 2020-08-11 LAB — MAGNESIUM: Magnesium: 2 mg/dL (ref 1.7–2.4)

## 2020-08-11 MED ORDER — POTASSIUM CHLORIDE CRYS ER 20 MEQ PO TBCR
40.0000 meq | EXTENDED_RELEASE_TABLET | Freq: Once | ORAL | Status: AC
  Administered 2020-08-11: 40 meq via ORAL
  Filled 2020-08-11: qty 2

## 2020-08-11 MED ORDER — SODIUM CHLORIDE 0.9 % IV SOLN
1.0000 g | Freq: Every day | INTRAVENOUS | Status: DC
  Administered 2020-08-11 – 2020-08-12 (×2): 1 g via INTRAVENOUS
  Filled 2020-08-11: qty 10
  Filled 2020-08-11: qty 1
  Filled 2020-08-11: qty 10

## 2020-08-11 NOTE — Progress Notes (Signed)
PROGRESS NOTE    Jocelyn Sanchez   ONG:[id number]  DOB: 04/12/1956  DOA: 08/06/2020     5  PCP: Dixie Dials, MD  CC: Cough, weakness, low hemoglobin  Hospital Course: Jocelyn Sanchez is a 65 yo female with PMH gastric/duodenal AVMs, MS (bedbound), hx PE (on chronic Eliquis), lymphedema, HTN, DMII, chronic combined systolic diastolic CHF (EF 44-01%, Gr 1 DD on 08/09/17 echo) who presented with cough, weakness and found to have hemoglobin of 4 g/dL on admission.  She received 2 units PRBC and repeat hemoglobin was 12 g/dL. Of note, 4 days prior to admission hemoglobin had been checked on 08/03/2019 and was 11.7 g/dL at that time. Hemoglobin has remained stable since transfusion with ongoing trending.  GI was consulted and given her history of underlying AVMs. She was also found to be positive for COVID-19 on screening on admission on 08/06/2020.  Due to minimal symptoms and requiring room air, she was not started on any treatment.   Interval History:  Patient denies any new complaints   Assessment & Plan:  Acute blood loss anemia likely 2/2 small bowel AVMs Odd response of Hgb 4 g/dL on admission s/p 2 units PRBC and repeat has been ~11-12 g/dL GI consulted s/p small bowel enteroscopy on 08/10/20 which showed small bowel AVMs s/p treated with APC Daily CBC  Chronic combined systolic and diastolic CHF Echo 0/27/2536: EF 40 to 45%, diffuse hypokinesis, grade 1 diastolic dysfunction No signs or symptoms of exacerbation Continue Coreg  COVID-19 virus infection Patient asymptomatic and on room air since admission Seen by PCCM initially.  Considered no indication for treatment  ?UTI Currently afebrile with resolved leukocytosis UC grew >100,000 gram negative rods Chest x-ray showed possible bibasilar atelectasis and infiltrates, currently saturating well on room air Start IV ceftriaxone  Hypomagnesemia/hypokalemia Replace as needed  Hx of pulmonary embolus Resume Eliquis on 08/11/2021  as per GI  Diabetes mellitus with peripheral vascular disease A1c 6.7% on 07/27/2019 Diet controlled diabetic  Multiple sclerosis Patient bedbound at baseline.  She states she has 24/7 caregivers at home     Antimicrobials: n/a  DVT prophylaxis: SCDs Start: 08/06/20 1740 apixaban (ELIQUIS) tablet 5 mg   Code Status:   Code Status: DNR Family Communication: None  Disposition Plan: Status is: Inpatient  Remains inpatient appropriate because:Ongoing diagnostic testing needed not appropriate for outpatient work up, IV treatments appropriate due to intensity of illness or inability to take PO and Inpatient level of care appropriate due to severity of illness   Dispo: The patient is from: Home              Anticipated d/c is to: Home              Anticipated d/c date is: 1 day              Patient currently is not medically stable to d/c.   Difficult to place patient No    Objective: Blood pressure 134/71, pulse 90, temperature (!) 97.5 F (36.4 C), temperature source Oral, resp. rate 17, height 5\' 3"  (1.6 m), weight 56.3 kg, SpO2 100 %.   Examination:  General: NAD, bedbound   Cardiovascular: S1, S2 present  Respiratory: CTAB  Abdomen: Soft, nontender, nondistended, bowel sounds present  Musculoskeletal: No bilateral pedal edema noted  Skin: Normal  Psychiatry: Normal mood    Consultants:   GI  Procedures:   Small bowel enteroscopy  Data Reviewed: I have personally reviewed following labs and imaging studies  Results for orders placed or performed during the hospital encounter of 08/06/20 (from the past 24 hour(s))  Glucose, capillary     Status: Abnormal   Collection Time: 08/10/20  9:14 PM  Result Value Ref Range   Glucose-Capillary 147 (H) 70 - 99 mg/dL  Basic metabolic panel     Status: Abnormal   Collection Time: 08/11/20  3:00 AM  Result Value Ref Range   Sodium 135 135 - 145 mmol/L   Potassium 3.1 (L) 3.5 - 5.1 mmol/L   Chloride 98 98 -  111 mmol/L   CO2 25 22 - 32 mmol/L   Glucose, Bld 154 (H) 70 - 99 mg/dL   BUN 18 8 - 23 mg/dL   Creatinine, Ser 0.48 0.44 - 1.00 mg/dL   Calcium 7.1 (L) 8.9 - 10.3 mg/dL   GFR, Estimated >60 >60 mL/min   Anion gap 12 5 - 15  CBC with Differential/Platelet     Status: Abnormal   Collection Time: 08/11/20  3:00 AM  Result Value Ref Range   WBC 9.9 4.0 - 10.5 K/uL   RBC 4.84 3.87 - 5.11 MIL/uL   Hemoglobin 11.4 (L) 12.0 - 15.0 g/dL   HCT 36.6 36.0 - 46.0 %   MCV 75.6 (L) 80.0 - 100.0 fL   MCH 23.6 (L) 26.0 - 34.0 pg   MCHC 31.1 30.0 - 36.0 g/dL   RDW 25.2 (H) 11.5 - 15.5 %   Platelets 396 150 - 400 K/uL   nRBC 0.3 (H) 0.0 - 0.2 %   Neutrophils Relative % 72 %   Neutro Abs 7.1 1.7 - 7.7 K/uL   Lymphocytes Relative 14 %   Lymphs Abs 1.4 0.7 - 4.0 K/uL   Monocytes Relative 10 %   Monocytes Absolute 1.0 0.1 - 1.0 K/uL   Eosinophils Relative 3 %   Eosinophils Absolute 0.3 0.0 - 0.5 K/uL   Basophils Relative 0 %   Basophils Absolute 0.0 0.0 - 0.1 K/uL   Immature Granulocytes 1 %   Abs Immature Granulocytes 0.14 (H) 0.00 - 0.07 K/uL   Schistocytes PRESENT    Burr Cells PRESENT    Ovalocytes PRESENT   Magnesium     Status: None   Collection Time: 08/11/20  3:00 AM  Result Value Ref Range   Magnesium 2.0 1.7 - 2.4 mg/dL  Glucose, capillary     Status: Abnormal   Collection Time: 08/11/20  7:40 AM  Result Value Ref Range   Glucose-Capillary 145 (H) 70 - 99 mg/dL  Glucose, capillary     Status: Abnormal   Collection Time: 08/11/20 12:32 PM  Result Value Ref Range   Glucose-Capillary 147 (H) 70 - 99 mg/dL  Glucose, capillary     Status: Abnormal   Collection Time: 08/11/20  4:42 PM  Result Value Ref Range   Glucose-Capillary 203 (H) 70 - 99 mg/dL    Recent Results (from the past 240 hour(s))  Resp Panel by RT-PCR (Flu A&B, Covid) Nasopharyngeal Swab     Status: Abnormal   Collection Time: 08/06/20  1:08 PM   Specimen: Nasopharyngeal Swab; Nasopharyngeal(NP) swabs in vial  transport medium  Result Value Ref Range Status   SARS Coronavirus 2 by RT PCR POSITIVE (A) NEGATIVE Final    Comment: RESULT CALLED TO, READ BACK BY AND VERIFIED WITH: L HILL AT 1558 ON 08/06/2020 BY JPM (NOTE) SARS-CoV-2 target nucleic acids are DETECTED.  The SARS-CoV-2 RNA is generally detectable in upper respiratory specimens during the acute  phase of infection. Positive results are indicative of the presence of the identified virus, but do not rule out bacterial infection or co-infection with other pathogens not detected by the test. Clinical correlation with patient history and other diagnostic information is necessary to determine patient infection status. The expected result is Negative.  Fact Sheet for Patients: EntrepreneurPulse.com.au  Fact Sheet for Healthcare Providers: IncredibleEmployment.be  This test is not yet approved or cleared by the [place] FDA and  has been authorized for detection and/or diagnosis of SARS-CoV-2 by FDA under an Emergency Use Authorization (EUA).  This EUA will remain in effect (meaning this test can b e used) for the duration of  the COVID-19 declaration under Section 564(b)(1) of the Act, 21 U.S.C. section 360bbb-3(b)(1), unless the authorization is terminated or revoked sooner.     Influenza A by PCR NEGATIVE NEGATIVE Final   Influenza B by PCR NEGATIVE NEGATIVE Final    Comment: (NOTE) The Xpert Xpress SARS-CoV-2/FLU/RSV plus assay is intended as an aid in the diagnosis of influenza from Nasopharyngeal swab specimens and should not be used as a sole basis for treatment. Nasal washings and aspirates are unacceptable for Xpert Xpress SARS-CoV-2/FLU/RSV testing.  Fact Sheet for Patients: EntrepreneurPulse.com.au  Fact Sheet for Healthcare Providers: IncredibleEmployment.be  This test is not yet approved or cleared by the [place] FDA and has been  authorized for detection and/or diagnosis of SARS-CoV-2 by FDA under an Emergency Use Authorization (EUA). This EUA will remain in effect (meaning this test can be used) for the duration of the COVID-19 declaration under Section 564(b)(1) of the Act, 21 U.S.C. section 360bbb-3(b)(1), unless the authorization is terminated or revoked.  Performed at [facility], [place] [address], [place], [place] [address]   Blood Culture (routine x 2)     Status: None   Collection Time: 08/06/20  1:08 PM   Specimen: BLOOD  Result Value Ref Range Status   Specimen Description   Final    BLOOD RIGHT ARM Performed at [facility] [address], [place], [place] [address]    Special Requests   Final    BOTTLES DRAWN AEROBIC AND ANAEROBIC Blood Culture adequate volume Performed at [facility] [address] [place], [place] [address]    Culture   Final    NO GROWTH 5 DAYS Performed at [facility], [place] [address], [place], [place] [address]    Report Status 08/11/2020 FINAL  Final  MRSA PCR Screening     Status: None   Collection Time: 08/06/20 10:34 PM   Specimen: Nasal Mucosa; Nasopharyngeal  Result Value Ref Range Status   MRSA by PCR NEGATIVE NEGATIVE Final    Comment:        The GeneXpert MRSA Assay (FDA approved for NASAL specimens only), is one component of a comprehensive MRSA colonization surveillance program. It is not intended to diagnose MRSA infection nor to guide or monitor treatment for MRSA infections. Performed at [facility], [place] [address], [place], [place] [address]   Culture, Urine     Status: Abnormal (Preliminary result)   Collection Time: 08/10/20  7:33 AM   Specimen: Urine, Clean Catch  Result Value Ref Range Status   Specimen Description   Final    URINE, CLEAN CATCH Performed at [facility], [place] [address], [place], [place] [address]    Special Requests    Final    NONE Performed at [facility], [place] [address], [place], [place] [address]    Culture (A)  Final    >=100,000 COLONIES/mL GRAM NEGATIVE RODS CULTURE REINCUBATED FOR BETTER GROWTH Performed at [facility], [place] [address] [place], [place] [address]    Report Status PENDING  Incomplete     Radiology Studies: DG Chest Port 1 View  Result Date: 08/10/2020 CLINICAL DATA:  Leukocytosis. EXAM: PORTABLE CHEST 1 VIEW COMPARISON:  08/06/2020. FINDINGS: Mediastinum and hilar structures normal. Heart size normal. Low lung volumes with bibasilar atelectasis and infiltrates. Small left pleural effusion cannot be excluded. Biapical pleural thickening consistent with scarring. No pneumothorax. IMPRESSION: Low lung volumes with bibasilar atelectasis and infiltrates. Small left pleural effusion cannot be excluded. Electronically Signed   By: Marcello Moores  Register   On: 08/10/2020 08:18   DG Chest Port 1 View  Final Result    DG Chest Port 1 View  Final Result      Scheduled Meds: . acetaminophen  1,000 mg Oral BID  . apixaban  5 mg Oral BID  . vitamin C  1,000 mg Oral Daily  . carvedilol  6.25 mg Oral BID  . cholecalciferol  2,000 Units Oral Daily  . diclofenac Sodium  2 g Topical QID  . feeding supplement (NEPRO CARB STEADY)  237 mL Oral TID BM  . Gerhardt's butt cream   Topical TID  . mouth rinse  15 mL Mouth Rinse BID  . metFORMIN  500 mg Oral BID WC  . methocarbamol  750 mg Oral BID  . multivitamin with minerals  1 tablet Oral Daily  . pantoprazole (PROTONIX) IV  40 mg Intravenous Q24H  . sacubitril-valsartan  1 tablet Oral BID  . traZODone  100 mg Oral QHS  . zinc sulfate  220 mg Oral Daily   PRN Meds: acetaminophen, dextromethorphan, docusate sodium, guaifenesin, hydrOXYzine, lactulose, ondansetron (ZOFRAN) IV, polyethylene glycol Continuous Infusions:    LOS: 5 days    Alma Friendly, MD [facility] 08/11/2020, 5:18 PM

## 2020-08-11 NOTE — TOC Progression Note (Signed)
Transition of Care [facility]) - Progression Note    Patient Details  Name: Jocelyn Sanchez MRN: [id number] Date of Birth: 1956/03/22  [facility] CM/SW Contact  Leeroy Cha, RN Phone Number: 08/11/2020, 8:53 AM  Clinical Narrative:    [facility] Events:  2/18 Admit to ICU for Hg 4 and hypotension 2/19 Received 2U PRBC with improvement of Hg 12 [id number]  Small bowel enteroscopy Indications:              Recurrent gastrointestinal bleeding   There was no evidence of significant pathology in the proximal jejunum. Impression:               - Widely patent Schatzki's ring, small hiatal                            hernia otherwise normal esophagus.                           - Normal stomach.                           - Two non-bleeding angioectasias in the duodenum.                            Treated with argon plasma coagulation (APC).                           - The examined portion of the jejunum was normal. Plan to follow for toc needs      Expected Discharge Plan and Services                                                 Social Determinants of Health (SDOH) Interventions    Readmission Risk Interventions Readmission Risk Prevention Plan 07/29/2019  Transportation Screening Complete  PCP or Specialist Appt within 3-5 Days Complete  HRI or [place] Complete  Social Work Consult for [place] Planning/Counseling Complete  Palliative Care Screening Complete  Medication Review [employer] photographer) Complete  Some recent data might be hidden

## 2020-08-12 LAB — CBC WITH DIFFERENTIAL/PLATELET
Abs Immature Granulocytes: 0.07 10*3/uL (ref 0.00–0.07)
Basophils Absolute: 0 10*3/uL (ref 0.0–0.1)
Basophils Relative: 0 %
Eosinophils Absolute: 0 10*3/uL (ref 0.0–0.5)
Eosinophils Relative: 0 %
HCT: 35.9 % — ABNORMAL LOW (ref 36.0–46.0)
Hemoglobin: 11.1 g/dL — ABNORMAL LOW (ref 12.0–15.0)
Immature Granulocytes: 1 %
Lymphocytes Relative: 13 %
Lymphs Abs: 1.2 10*3/uL (ref 0.7–4.0)
MCH: 23.3 pg — ABNORMAL LOW (ref 26.0–34.0)
MCHC: 30.9 g/dL (ref 30.0–36.0)
MCV: 75.3 fL — ABNORMAL LOW (ref 80.0–100.0)
Monocytes Absolute: 0.9 10*3/uL (ref 0.1–1.0)
Monocytes Relative: 10 %
Neutro Abs: 6.9 10*3/uL (ref 1.7–7.7)
Neutrophils Relative %: 76 %
Platelets: 440 10*3/uL — ABNORMAL HIGH (ref 150–400)
RBC: 4.77 MIL/uL (ref 3.87–5.11)
RDW: 25 % — ABNORMAL HIGH (ref 11.5–15.5)
WBC: 9.1 10*3/uL (ref 4.0–10.5)
nRBC: 0.3 % — ABNORMAL HIGH (ref 0.0–0.2)

## 2020-08-12 LAB — BASIC METABOLIC PANEL
Anion gap: 11 (ref 5–15)
BUN: 19 mg/dL (ref 8–23)
CO2: 25 mmol/L (ref 22–32)
Calcium: 7.7 mg/dL — ABNORMAL LOW (ref 8.9–10.3)
Chloride: 99 mmol/L (ref 98–111)
Creatinine, Ser: 0.51 mg/dL (ref 0.44–1.00)
GFR, Estimated: >60 mL/min (ref >60)
Glucose, Bld: 159 mg/dL — ABNORMAL HIGH (ref 70–99)
Potassium: 4.4 mmol/L (ref 3.5–5.1)
Sodium: 135 mmol/L (ref 135–145)

## 2020-08-12 LAB — MAGNESIUM: Magnesium: 1.9 mg/dL (ref 1.7–2.4)

## 2020-08-12 LAB — URINE CULTURE: Culture: >=100000 — AB

## 2020-08-12 LAB — GLUCOSE, CAPILLARY
Glucose-Capillary: 224 mg/dL — ABNORMAL HIGH (ref 70–99)
Glucose-Capillary: 132 mg/dL — ABNORMAL HIGH (ref 70–99)
Glucose-Capillary: 153 mg/dL — ABNORMAL HIGH (ref 70–99)
Glucose-Capillary: 121 mg/dL — ABNORMAL HIGH (ref 70–99)

## 2020-08-12 MED ORDER — OMEPRAZOLE 20 MG PO CPDR: 20.0000 mg | DELAYED_RELEASE_CAPSULE | Freq: Two times a day (BID) | ORAL | 0 refills | Status: DC

## 2020-08-12 MED ORDER — FOSFOMYCIN TROMETHAMINE 3 G PO PACK
3.0000 g | PACK | Freq: Once | ORAL | Status: AC
  Administered 2020-08-12: 3 g via ORAL
  Filled 2020-08-12: qty 3

## 2020-08-12 MED ORDER — GERHARDT'S BUTT CREAM: 1.0000 "application " | TOPICAL_CREAM | Freq: Three times a day (TID) | CUTANEOUS | 0 refills | Status: DC

## 2020-08-12 MED ORDER — ZINC OXIDE 40 % EX OINT: 1.0000 "application " | TOPICAL_OINTMENT | CUTANEOUS | 0 refills | Status: DC | PRN

## 2020-08-12 MED ORDER — PANTOPRAZOLE SODIUM 40 MG PO TBEC: 40.0000 mg | DELAYED_RELEASE_TABLET | Freq: Two times a day (BID) | ORAL | Status: DC

## 2020-08-12 NOTE — Discharge Summary (Signed)
Discharge Summary  Jocelyn Sanchez BBC:[id number] DOB: 1956/05/10  PCP: Jocelyn Dials, MD  Admit date: 08/06/2020 Discharge date: 08/12/2020  Time spent: 40 mins   Recommendations for Outpatient Follow-up:  1. PCP in 1 week with repeat labs   Discharge Diagnoses:  Active Hospital Problems   Diagnosis Date Noted  . Pressure injury of skin 08/09/2020  . Chronic combined systolic (congestive) and diastolic (congestive) heart failure ([place]) 08/09/2020  . COVID-19 virus infection 08/06/2020  . Acute blood loss anemia 08/06/2020  . Hx of pulmonary embolus 10/26/2015  . Diabetes mellitus with peripheral vascular disease ([place]) 03/06/2015  . Multiple sclerosis ([place]) 05/11/2011    Resolved Hospital Problems   Diagnosis Date Noted Date Resolved  . Acute kidney injury [facility]) 03/01/2015 03/06/2015    Discharge Condition: Stable  Diet recommendation: As tolerated  Vitals:   08/12/20 1117 08/12/20 1200  BP: 121/66   Pulse:    Resp: 16   Temp:  98 F (36.7 C)  SpO2: 100%     History of present illness:  Jocelyn Sanchez is a 65 yo female with PMH gastric/duodenal AVMs, MS (bedbound), hx PE (on chronic Eliquis), lymphedema, HTN, DMII, chronic combined systolic diastolic CHF (EF 50-38%, Gr 1 DD on 08/09/17 echo) who presented with cough, weakness and found to have hemoglobin of 4 g/dL on admission. She received 2 units PRBC and repeat hemoglobin was 12 g/dL. Of note, 4 days prior to admission hemoglobin had been checked on 08/03/2019 and was 11.7 g/dL at that time. Hemoglobin has remained stable since transfusion with ongoing trending. GI was consulted and given her history of underlying AVMs. She was also found to be positive for COVID-19 on screening on admission on 08/06/2020. Due to minimal symptoms and requiring room air, she was not started on any treatment. Pt admitted for further management.    Today, pt denies any new complaints, very eager to be discharged, denies any chest pain,  abdominal pain, fever//chills, no melena/BRBPR. Pt will be discharged home, has 24H care. Discussed with son about discharge plans. SW to call for transportation.    Hospital Course:  Active Problems:   Multiple sclerosis (HCC)   Diabetes mellitus with peripheral vascular disease (HCC)   Hx of pulmonary embolus   COVID-19 virus infection   Acute blood loss anemia   Pressure injury of skin   Chronic combined systolic (congestive) and diastolic (congestive) heart failure (HCC)   Acute blood loss anemia likely 2/2 small bowel AVMs Odd response of Hgb 4 g/dL on admission s/p 2 units PRBC and repeat has been ~11-12 g/dL GI consulted s/p small bowel enteroscopy on 08/10/20 which showed small bowel AVMs s/p treated with APC Follow up with PCP with repeat labs  Chronic combined systolic and diastolic CHF Echo 8/82/8003: EF 40 to 45%, diffuse hypokinesis, grade 1 diastolic dysfunction No signs or symptoms of exacerbation Continue Coreg, entresto  COVID-19 virus infection Patient asymptomatic and on room air since admission Chest x-ray showed possible bibasilar atelectasis and infiltrates Seen by PCCM initially. Considered no indication for treatment  ESBL UTI Currently afebrile with resolved leukocytosis, hemodynamically stable  UC grew >100,000 ESBL Gave a dose of fosfomycin 3G on 08/12/20 PCP to follow up  Hypomagnesemia/hypokalemia Replaced as needed  Hx of pulmonary embolus Resumed Eliquis on 08/11/2021 as per GI  Diabetes mellitus with peripheral vascular disease A1c 6.7% on 07/27/2019 Continue metformin  Multiple sclerosis Patient bedbound at baseline, has 24/7 caregivers at home  Malnutrition Type:  Nutrition Problem: Increased nutrient needs Etiology: catabolic illness (COVID 19, wound healing)   Malnutrition Characteristics:  Signs/Symptoms: estimated needs   Nutrition Interventions:      Estimated body mass index is 21.99 kg/m as  calculated from the following:   Height as of this encounter: 5\' 3"  (1.6 m).   Weight as of this encounter: 56.3 kg.    Procedures:  Small bowel enteroscopy   Consultations:  PCCM  GI  Discharge Exam: BP 121/66   Pulse 89   Temp 98 F (36.7 C) (Oral)   Resp 16   Ht 5\' 3"  (1.6 m)   Wt 56.3 kg   SpO2 100%   BMI 21.99 kg/m   General: NAD  Cardiovascular: S1, S2 present Respiratory: CTAB   Discharge Instructions You were cared for by a hospitalist during your hospital stay. If you have any questions about your discharge medications or the care you received while you were in the hospital after you are discharged, you can call the unit and asked to speak with the hospitalist on call if the hospitalist that took care of you is not available. Once you are discharged, your primary care physician will handle any further medical issues. Please note that NO REFILLS for any discharge medications will be authorized once you are discharged, as it is imperative that you return to your primary care physician (or establish a relationship with a primary care physician if you do not have one) for your aftercare needs so that they can reassess your need for medications and monitor your lab values.  Discharge Instructions    Diet - low sodium heart healthy   Complete by: As directed    Discharge wound care:   Complete by: As directed    Comments: Wound care to sacral Stage 3 pressure injury (POA):  Cleanse with NS, pat dry.  Cover wound bed with silver hydrofiber (Aquacel Advantage, Lawson # [id number]). Top with dry gauze 4x4, then top with silicone foam dressing for sacrum. Change silver dressing daily and PRN soiling.  May reused foam and change every 3 days and PRN. Turn patient side to side and minimize time of sacral contact with mattress.      Wound care  Daily      Comments: Wound care to left heel stage 2 pressure injury (POA):  Cleanse with NS, pat dry. Cover with xeroform gauze Kellie Simmering #  [id number]), top with dry gauze and cover with silicone foam for heel.  Change xeroform gauze and dry gauze topped daily.  May reuse silicone foam for up to 3 days.  Change PRN for soiling. Float heels   Increase activity slowly   Complete by: As directed      Allergies as of 08/12/2020      Reactions   Sulfa Antibiotics Shortness Of Breath, Swelling   Chlorhexidine       Medication List    STOP taking these medications   dexamethasone 6 MG tablet Commonly known as: DECADRON     TAKE these medications   acetaminophen 500 MG tablet Commonly known as: TYLENOL Take 1,000 mg by mouth 2 (two) times daily.   carvedilol 6.25 MG tablet Commonly known as: COREG Take 6.25 mg by mouth 2 (two) times daily.   [employer] D3 50 MCG (2000 UT) Caps Generic drug: Cholecalciferol Take 2,000 Units by mouth daily.   Eliquis 5 MG Tabs tablet Generic drug: apixaban Take 5 mg by mouth 2 (two) times daily.   Delene Loll  24-26 MG Generic drug: sacubitril-valsartan Take 1 tablet by mouth 2 (two) times daily.   Gerhardt's butt cream Crea Apply 1 application topically 3 (three) times daily.   guaifenesin 100 MG/5ML syrup Commonly known as: ROBITUSSIN Take 200 mg by mouth 3 (three) times daily as needed for cough.   lactulose 10 GM/15ML solution Commonly known as: CHRONULAC Take 30 mLs by mouth daily as needed for constipation.   metFORMIN 500 MG tablet Commonly known as: GLUCOPHAGE Take 500 mg by mouth 2 (two) times daily.   methocarbamol 500 MG tablet Commonly known as: ROBAXIN Take 750 mg by mouth 2 (two) times daily.   omeprazole 20 MG capsule Commonly known as: PRILOSEC Take 1 capsule (20 mg total) by mouth 2 (two) times daily before a meal. What changed: when to take this   traZODone 100 MG tablet Commonly known as: DESYREL Take 100 mg by mouth at bedtime.   vitamin C 1000 MG tablet Take 1,000 mg by mouth daily.   Zinc Sulfate 220 (50 Zn) MG Tabs Take 220 mg by mouth daily.             Discharge Care Instructions  (From admission, onward)         Start     Ordered   08/12/20 0000  Discharge wound care:       Comments: Comments: Wound care to sacral Stage 3 pressure injury (POA):  Cleanse with NS, pat dry.  Cover wound bed with silver hydrofiber (Aquacel Advantage, Lawson # [id number]). Top with dry gauze 4x4, then top with silicone foam dressing for sacrum. Change silver dressing daily and PRN soiling.  May reused foam and change every 3 days and PRN. Turn patient side to side and minimize time of sacral contact with mattress.      Wound care  Daily      Comments: Wound care to left heel stage 2 pressure injury (POA):  Cleanse with NS, pat dry. Cover with xeroform gauze Kellie Simmering # [id number]), top with dry gauze and cover with silicone foam for heel.  Change xeroform gauze and dry gauze topped daily.  May reuse silicone foam for up to 3 days.  Change PRN for soiling. Float heels   08/12/20 1204         Allergies  Allergen Reactions  . Sulfa Antibiotics Shortness Of Breath and Swelling  . Chlorhexidine     Follow-up Information    Jocelyn Dials, MD. Schedule an appointment as soon as possible for a visit in 1 week(s).   Specialty: Cardiology Contact information: [place] [address] [phone]                The results of significant diagnostics from this hospitalization (including imaging, microbiology, ancillary and laboratory) are listed below for reference.    Significant Diagnostic Studies: DG Chest Port 1 View  Result Date: 08/10/2020 CLINICAL DATA:  Leukocytosis. EXAM: PORTABLE CHEST 1 VIEW COMPARISON:  08/06/2020. FINDINGS: Mediastinum and hilar structures normal. Heart size normal. Low lung volumes with bibasilar atelectasis and infiltrates. Small left pleural effusion cannot be excluded. Biapical pleural thickening consistent with scarring. No pneumothorax. IMPRESSION: Low lung volumes with bibasilar atelectasis and  infiltrates. Small left pleural effusion cannot be excluded. Electronically Signed   By: Marcello Moores  Register   On: 08/10/2020 08:18   DG Chest Port 1 View  Result Date: 08/06/2020 CLINICAL DATA:  COVID positive in a 65 year old female. EXAM: PORTABLE CHEST 1 VIEW COMPARISON:  July 27, 2019  FINDINGS: Image rotated to the LEFT. Accounting for this cardiomediastinal contours and hilar structures are stable. Lungs are clear.  No sign of pleural effusion. On limited assessment no acute skeletal process with glenohumeral degenerative changes bilaterally. IMPRESSION: No active cardiopulmonary disease. Electronically Signed   By: Zetta Bills M.D.   On: 08/06/2020 13:36    Microbiology: Recent Results (from the past 240 hour(s))  Resp Panel by RT-PCR (Flu A&B, Covid) Nasopharyngeal Swab     Status: Abnormal   Collection Time: 08/06/20  1:08 PM   Specimen: Nasopharyngeal Swab; Nasopharyngeal(NP) swabs in vial transport medium  Result Value Ref Range Status   SARS Coronavirus 2 by RT PCR POSITIVE (A) NEGATIVE Final    Comment: RESULT CALLED TO, READ BACK BY AND VERIFIED WITH: L HILL AT 1558 ON 08/06/2020 BY JPM (NOTE) SARS-CoV-2 target nucleic acids are DETECTED.  The SARS-CoV-2 RNA is generally detectable in upper respiratory specimens during the acute phase of infection. Positive results are indicative of the presence of the identified virus, but do not rule out bacterial infection or co-infection with other pathogens not detected by the test. Clinical correlation with patient history and other diagnostic information is necessary to determine patient infection status. The expected result is Negative.  Fact Sheet for Patients: EntrepreneurPulse.com.au  Fact Sheet for Healthcare Providers: IncredibleEmployment.be  This test is not yet approved or cleared by the [place] FDA and  has been authorized for detection and/or diagnosis of SARS-CoV-2 by FDA  under an Emergency Use Authorization (EUA).  This EUA will remain in effect (meaning this test can b e used) for the duration of  the COVID-19 declaration under Section 564(b)(1) of the Act, 21 U.S.C. section 360bbb-3(b)(1), unless the authorization is terminated or revoked sooner.     Influenza A by PCR NEGATIVE NEGATIVE Final   Influenza B by PCR NEGATIVE NEGATIVE Final    Comment: (NOTE) The Xpert Xpress SARS-CoV-2/FLU/RSV plus assay is intended as an aid in the diagnosis of influenza from Nasopharyngeal swab specimens and should not be used as a sole basis for treatment. Nasal washings and aspirates are unacceptable for Xpert Xpress SARS-CoV-2/FLU/RSV testing.  Fact Sheet for Patients: EntrepreneurPulse.com.au  Fact Sheet for Healthcare Providers: IncredibleEmployment.be  This test is not yet approved or cleared by the [place] FDA and has been authorized for detection and/or diagnosis of SARS-CoV-2 by FDA under an Emergency Use Authorization (EUA). This EUA will remain in effect (meaning this test can be used) for the duration of the COVID-19 declaration under Section 564(b)(1) of the Act, 21 U.S.C. section 360bbb-3(b)(1), unless the authorization is terminated or revoked.  Performed at [facility], [place] [address] [place], [place] [address]   Blood Culture (routine x 2)     Status: None   Collection Time: 08/06/20  1:08 PM   Specimen: BLOOD  Result Value Ref Range Status   Specimen Description   Final    BLOOD RIGHT ARM Performed at [facility] [address], [place], [place] [address]    Special Requests   Final    BOTTLES DRAWN AEROBIC AND ANAEROBIC Blood Culture adequate volume Performed at [facility] [address], [place], [place] [address]    Culture   Final    NO GROWTH 5 DAYS Performed at [facility], [place] [address], [place], [place] [address]     Report Status 08/11/2020 FINAL  Final  MRSA PCR Screening     Status: None  Collection Time: 08/06/20 10:34 PM   Specimen: Nasal Mucosa; Nasopharyngeal  Result Value Ref Range Status   MRSA by PCR NEGATIVE NEGATIVE Final    Comment:        The GeneXpert MRSA Assay (FDA approved for NASAL specimens only), is one component of a comprehensive MRSA colonization surveillance program. It is not intended to diagnose MRSA infection nor to guide or monitor treatment for MRSA infections. Performed at [facility], [place] [address] [place], [place] [address]   Culture, Urine     Status: Abnormal   Collection Time: 08/10/20  7:33 AM   Specimen: Urine, Clean Catch  Result Value Ref Range Status   Specimen Description   Final    URINE, CLEAN CATCH Performed at [facility], [place] [address] [place], [place] [address]    Special Requests   Final    NONE Performed at [facility], [place] [address] [place], [place] [address]    Culture (A)  Final    >=100,000 COLONIES/mL ESCHERICHIA COLI Confirmed Extended Spectrum Beta-Lactamase Producer (ESBL).  In bloodstream infections from ESBL organisms, carbapenems are preferred over piperacillin/tazobactam. They are shown to have a lower risk of mortality.    Report Status 08/12/2020 FINAL  Final   Organism ID, Bacteria ESCHERICHIA COLI (A)  Final      Susceptibility   Escherichia coli - MIC*    AMPICILLIN >=32 RESISTANT Resistant     CEFAZOLIN >=64 RESISTANT Resistant     CEFEPIME >=32 RESISTANT Resistant     CEFTRIAXONE >=64 RESISTANT Resistant     CIPROFLOXACIN >=4 RESISTANT Resistant     GENTAMICIN <=1 SENSITIVE Sensitive     IMIPENEM <=0.25 SENSITIVE Sensitive     NITROFURANTOIN <=16 SENSITIVE Sensitive     TRIMETH/SULFA <=20 SENSITIVE Sensitive     AMPICILLIN/SULBACTAM >=32 RESISTANT Resistant     PIP/TAZO 8 SENSITIVE Sensitive     * >=100,000 COLONIES/mL ESCHERICHIA COLI      Labs: Basic Metabolic Panel: Recent Labs  Lab 08/08/20 0555 08/09/20 0226 08/10/20 0257 08/11/20 0300 08/12/20 0248  NA 135 135 136 135 135  K 4.6 3.0* 3.6 3.1* 4.4  CL 105 96* 97* 98 99  CO2 19* 28 27 25 25   GLUCOSE 97 134* 173* 154* 159*  BUN 25* 18 15 18 19   CREATININE 0.60 0.55 0.47 0.48 0.51  CALCIUM 7.6* 6.9* 6.8* 7.1* 7.7*  MG  --   --  0.9* 2.0 1.9   Liver Function Tests: Recent Labs  Lab 08/06/20 1308  AST 8*  ALT 6  ALKPHOS 82  BILITOT 0.8  PROT 7.8  ALBUMIN 2.9*   No results for input(s): LIPASE, AMYLASE in the last 168 hours. No results for input(s): AMMONIA in the last 168 hours. CBC: Recent Labs  Lab 08/06/20 1308 08/06/20 1637 08/07/20 1639 08/08/20 0555 08/10/20 0257 08/11/20 0300 08/12/20 0248  WBC 9.9   < > 8.1 10.4 13.3* 9.9 9.1  NEUTROABS 7.6  --   --   --  10.2* 7.1 6.9  HGB 4.0*   < > 11.2* 11.2* 10.3* 11.4* 11.1*  HCT 15.0*   < > 34.2* 32.9* 32.8* 36.6 35.9*  MCV 62.8*   < > 72.8* 70.1* 74.9* 75.6* 75.3*  PLT 712*   < > 344 381 353 396 440*   < > = values in this interval not displayed.   Cardiac Enzymes: No results for input(s): CKTOTAL, CKMB, CKMBINDEX, TROPONINI in the last 168 hours.  BNP: BNP (last 3 results) No results for input(s): BNP in the last 8760 hours.  ProBNP (last 3 results) No results for input(s): PROBNP in the last 8760 hours.  CBG: Recent Labs  Lab 08/11/20 0740 08/11/20 1232 08/11/20 1642 08/12/20 0754 08/12/20 1210  GLUCAP 145* 147* 203* 132* 153*       Signed:  Alma Friendly, MD [facility] 08/12/2020, 12:21 PM

## 2020-08-12 NOTE — Progress Notes (Signed)
Patient's son, Vicente Males was called by this RN to inform of pending discharge. All questions answered by this RN, and AVS also reviewed with Juanda Bond over the phone.  Patient's son has caregivers awaiting pt at home. Suanne Marker, Education officer, museum ensured pt had all needed equipment/supplies and scheduled follow-ups at discharge.   Education and careplan completed. AVS instructions reviewed by this RN with pt. All questions answered at this time. All belongings returned to pt. PIV removed by RN. [facility] will now transport pt to her home per orders.

## 2020-08-12 NOTE — TOC Transition Note (Signed)
Transition of Care [facility]) - CM/SW Discharge Note   Patient Details  Name: Jocelyn Sanchez MRN: [id number] Date of Birth: 1956/01/25  Transition of [facility]) CM/SW Contact:  Leeroy Cha, RN Phone Number: 08/12/2020, 1:25 PM   Clinical Narrative:    Patient being dcd to home with hhc-rn for dressing changes. [facility].   Final next level of care: [place] Barriers to Discharge: Barriers Resolved   Patient Goals and CMS Choice        Discharge Placement                       Discharge Plan and Services   Discharge Planning Services: CM Consult                      HH Arranged: RN [facility]: [facility] Date [place]: 08/12/20 Time [place]: [facility] Representative spoke with at [place]: [place] ([place]) Interventions     Readmission Risk Interventions Readmission Risk Prevention Plan 07/29/2019  Transportation Screening Complete  PCP or Specialist Appt within 3-5 Days Complete  HRI or [place] Complete  Social Work Consult for [place] Planning/Counseling Complete  Palliative Care Screening Complete  Medication Review Press photographer) Complete  Some recent data might be hidden

## 2020-08-12 NOTE — Plan of Care (Signed)
Careplan complete; pt is adequate for discharge.

## 2020-08-13 NOTE — TOC Progression Note (Signed)
Transition of Care [facility]) - Progression Note    Patient Details  Name: Jocelyn Sanchez MRN: [id number] Date of Birth: [birth year]  Transition [facility]) CM/SW Contact  Leeroy Cha, RN Phone Number: 08/13/2020, 8:23 AM  Clinical Narrative:    Home health care turned down due to staffing and home. Text to [facility] [place] with [employer] answer will start today.with home care for dressing changes.   Expected Discharge Plan: [place] Barriers to Discharge: Barriers Resolved  Expected Discharge Plan and Services Expected Discharge Plan: [place]   Discharge Planning Services: CM Consult     Expected Discharge Date: 08/12/20                         HH Arranged: RN [place] Agency: [facility] ([facility] [employer]) Date [place]: 08/12/20 Time [place]: [place] Representative spoke with at [place]: [place] ([place]) Interventions    Readmission Risk Interventions Readmission Risk Prevention Plan 07/29/2019  Transportation Screening Complete  PCP or Specialist Appt within 3-5 Days Complete  HRI or [place] Complete  Social Work Consult for [place] Planning/Counseling Complete  Palliative Care Screening Complete  Medication Review Press photographer) Complete  Some recent data might be hidden

## 2020-09-21 ENCOUNTER — Encounter (HOSPITAL_BASED_OUTPATIENT_CLINIC_OR_DEPARTMENT_OTHER): Payer: Medicare Other | Admitting: Internal Medicine

## 2020-10-19 ENCOUNTER — Other Ambulatory Visit: Payer: Self-pay

## 2020-10-19 ENCOUNTER — Encounter (HOSPITAL_BASED_OUTPATIENT_CLINIC_OR_DEPARTMENT_OTHER): Payer: Medicare Other | Attending: Internal Medicine | Admitting: Internal Medicine

## 2020-10-19 DIAGNOSIS — Z86711 Personal history of pulmonary embolism: Secondary | ICD-10-CM | POA: Diagnosis not present

## 2020-10-19 DIAGNOSIS — L89154 Pressure ulcer of sacral region, stage 4: Secondary | ICD-10-CM | POA: Insufficient documentation

## 2020-10-19 DIAGNOSIS — Z882 Allergy status to sulfonamides status: Secondary | ICD-10-CM | POA: Diagnosis not present

## 2020-10-19 DIAGNOSIS — I890 Lymphedema, not elsewhere classified: Secondary | ICD-10-CM | POA: Insufficient documentation

## 2020-10-19 DIAGNOSIS — Z87891 Personal history of nicotine dependence: Secondary | ICD-10-CM | POA: Insufficient documentation

## 2020-10-19 DIAGNOSIS — Z86718 Personal history of other venous thrombosis and embolism: Secondary | ICD-10-CM | POA: Insufficient documentation

## 2020-10-19 DIAGNOSIS — E1151 Type 2 diabetes mellitus with diabetic peripheral angiopathy without gangrene: Secondary | ICD-10-CM | POA: Insufficient documentation

## 2020-10-19 DIAGNOSIS — E11622 Type 2 diabetes mellitus with other skin ulcer: Secondary | ICD-10-CM | POA: Insufficient documentation

## 2020-10-19 DIAGNOSIS — G35 Multiple sclerosis: Secondary | ICD-10-CM | POA: Insufficient documentation

## 2020-10-19 DIAGNOSIS — I10 Essential (primary) hypertension: Secondary | ICD-10-CM | POA: Diagnosis not present

## 2020-10-21 NOTE — Progress Notes (Signed)
Jocelyn, Sanchez ([id number]) Visit Report for 10/19/2020 Abuse/Suicide Risk Screen Details Patient Name: Date of Service: Jocelyn Sanchez [name] 10/19/2020 2:45 PM Medical Record Number: [id number] Patient Account Number: [id number] Date of Birth/Sex: Treating RN: Dec 06, 1955 (65 y.o. [name] Primary Care Amayrani Bennick: [name] Other Clinician: Referring Maritza Goldsborough: Treating Meygan Kyser/Extender: Drucilla Schmidt Weeks in Treatment: 0 Abuse/Suicide Risk Screen Items Answer ABUSE RISK SCREEN: Has anyone close to you tried to hurt or harm you recentlyo No Do you feel uncomfortable with anyone in your familyo No Has anyone forced you do things that you didnt want to doo No Electronic Signature(s) Signed: 10/21/2020 5:41:41 PM By: Levan Hurst RN, BSN Entered By: Levan Hurst on 10/19/2020 15:16:01 -------------------------------------------------------------------------------- Activities of Daily Living Details Patient Name: Date of Service: [name], [name] 10/19/2020 2:45 PM Medical Record Number: [id number] Patient Account Number: [id number] Date of Birth/Sex: Treating RN: 1955-07-09 (65 y.o. [name] Primary Care Ennifer Harston: [name] Other Clinician: Referring Tyress Loden: Treating Danie Diehl/Extender: Drucilla Schmidt Weeks in Treatment: 0 Activities of Daily Living Items Answer Activities of Daily Living (Please select one for each item) Drive Automobile Not Able T Medications ake Need Assistance Use T elephone Need Assistance Care for Appearance Need Assistance Use T oilet Need Assistance Bath / Shower Need Assistance Dress Self Need Assistance Feed Self Need Assistance Walk Not Able Get In / Out Bed Need Assistance Housework Not Able Prepare Meals Not Able Handle Money Need Assistance Shop for Self Need Assistance Electronic Signature(s) Signed: 10/21/2020 5:41:41 PM By: Levan Hurst RN, BSN Entered By: Levan Hurst on 10/19/2020 15:16:28 -------------------------------------------------------------------------------- Education Screening Details Patient Name: Date of Service: Jocelyn Sanchez [name] 10/19/2020 2:45 PM Medical Record Number: [id number] Patient Account Number: [id number] Date of Birth/Sex: Treating RN: Oct 24, 1955 (65 y.o. [name] Primary Care Shimika Ames: [name] Other Clinician: Referring Aslan Himes: Treating Tattianna Schnarr/Extender: Alycia Rossetti in Treatment: 0 Primary Learner Assessed: Patient Learning Preferences/Education Level/Primary Language Learning Preference: Explanation, Demonstration, Printed Material Highest Education Level: College or Above Preferred Language: English Cognitive Barrier Language Barrier: No Translator Needed: No Memory Deficit: No Emotional Barrier: No Cultural/Religious Beliefs Affecting Medical Care: No Physical Barrier Impaired Vision: No Impaired Hearing: No Decreased Hand dexterity: No Knowledge/Comprehension Knowledge Level: High Comprehension Level: High Ability to understand written instructions: High Ability to understand verbal instructions: High Motivation Anxiety Level: Calm Cooperation: Cooperative Education Importance: Acknowledges Need Interest in Health Problems: Asks Questions Perception: Coherent Willingness to Engage in Self-Management High Activities: Readiness to Engage in Self-Management High Activities: Electronic Signature(s) Signed: 10/21/2020 5:41:41 PM By: Levan Hurst RN, BSN Entered By: Levan Hurst on 10/19/2020 15:16:49 -------------------------------------------------------------------------------- Fall Risk Assessment Details Patient Name: Date of Service: Jocelyn Sanchez [name] 10/19/2020 2:45 PM Medical Record Number: [id number] Patient Account Number: [id number] Date of Birth/Sex: Treating RN: 12/21/1955 (65 y.o. [name] Primary Care Jolane Bankhead: [name] Other Clinician: Referring Xzaria Teo: Treating Keyston Ardolino/Extender: Drucilla Schmidt Weeks in Treatment: 0 Fall Risk Assessment Items Have you had 2 or more falls in the last 12 monthso 0 No Have you had any fall that resulted in injury in the last 12 monthso 0 No FALLS RISK SCREEN History of falling - immediate or within 3 months 0 No Secondary diagnosis (Do you have 2 or more medical diagnoseso) 15 Yes Ambulatory aid None/bed rest/wheelchair/nurse 0 Yes Crutches/cane/walker 0 No Furniture 0 No  Intravenous therapy Access/Saline/Heparin Lock 0 No Gait/Transferring Normal/ bed rest/ wheelchair 0 Yes Weak (short steps with or without shuffle, stooped but able to lift head while walking, may seek 0 No support from furniture) Impaired (short steps with shuffle, may have difficulty arising from chair, head down, impaired 0 No balance) Mental Status Oriented to own ability 0 Yes Electronic Signature(s) Signed: 10/21/2020 5:41:41 PM By: Levan Hurst RN, BSN Entered By: Levan Hurst on 10/19/2020 15:17:20 -------------------------------------------------------------------------------- Nutrition Risk Screening Details Patient Name: Date of Service: Jocelyn Sanchez [name] 10/19/2020 2:45 PM Medical Record Number: [id number] Patient Account Number: [id number] Date of Birth/Sex: Treating RN: August 10, 1955 (65 y.o. [name] Primary Care Princeton Nabor: [name] Other Clinician: Referring Adekunle Rohrbach: Treating Bethanie Bloxom/Extender: Drucilla Schmidt Weeks in Treatment: 0 Height (in): 63 Weight (lbs): 142 Body Mass Index (BMI): 25.2 Nutrition Risk Screening Items Score Screening NUTRITION RISK SCREEN: I have an illness or condition that made me change the kind and/or amount of food I eat 0 No I eat fewer than two meals per day 0 No I eat few fruits and vegetables, or milk products 0 No I have three or more drinks of beer, liquor or wine almost every  day 0 No I have tooth or mouth problems that make it hard for me to eat 0 No I don't always have enough money to buy the food I need 0 No I eat alone most of the time 0 No I take three or more different prescribed or over-the-counter drugs a day 1 Yes Without wanting to, I have lost or gained 10 pounds in the last six months 0 No I am not always physically able to shop, cook and/or feed myself 2 Yes Nutrition Protocols Good Risk Protocol Moderate Risk Protocol 0 Provide education on nutrition High Risk Proctocol Risk Level: Moderate Risk Score: 3 Electronic Signature(s) Signed: 10/21/2020 5:41:41 PM By: Levan Hurst RN, BSN Entered By: Levan Hurst on 10/19/2020 15:17:31

## 2020-10-22 NOTE — Progress Notes (Signed)
Jocelyn, Sanchez ([id number]) Visit Report for 10/19/2020 Allergy List Details Patient Name: Date of Service: Jocelyn Sanchez [name] 10/19/2020 2:45 PM Medical Record Number: [id number] Patient Account Number: [id number] Date of Birth/Sex: Treating RN: 1955-09-08 (65 y.o. Jocelyn Sanchez Primary Care Isrrael Fluckiger: [name] Other Clinician: Referring Bracen Schum: Treating Cherl Gorney/Extender: Drucilla Schmidt Weeks in Treatment: 0 Allergies Active Allergies Sulfa (Sulfonamide Antibiotics) Reaction: shortness of breath Allergy Notes Electronic Signature(s) Signed: 10/21/2020 5:41:41 PM By: Levan Hurst RN, BSN Entered By: Levan Hurst on 10/19/2020 15:02:34 -------------------------------------------------------------------------------- Arrival Information Details Patient Name: Date of Service: Jocelyn Sanchez [name] 10/19/2020 2:45 PM Medical Record Number: [id number] Patient Account Number: [id number] Date of Birth/Sex: Treating RN: 28-Jan-1956 (65 y.o. Jocelyn Sanchez Primary Care Jaylene Arrowood: [name] Other Clinician: Referring Hydee Fleece: Treating Syra Sirmons/Extender: Drucilla Schmidt Weeks in Treatment: 0 Visit Information Patient Arrived: Wheel Chair Arrival Time: 14:57 Accompanied By: alone Transfer Assistance: Hoyer Lift Patient Identification Verified: Yes Secondary Verification Process Completed: Yes Patient Has Alerts: Yes Patient Alerts: Patient on Blood Thinner History Since Last Visit Added or deleted any medications: No Any new allergies or adverse reactions: No Had a fall or experienced change in activities of daily living that may affect risk of falls: No Signs or symptoms of abuse/neglect since last visito No Hospitalized since last visit: No Implantable device outside of the clinic excluding cellular tissue based products placed in the center since last visit: No Has Dressing in Place as Prescribed: Yes Pain Present Now:  No Electronic Signature(s) Signed: 10/21/2020 5:41:41 PM By: Levan Hurst RN, BSN Entered By: Levan Hurst on 10/19/2020 15:01:12 -------------------------------------------------------------------------------- Clinic Level of Care Assessment Details Patient Name: Date of Service: Jocelyn Sanchez [name] 10/19/2020 2:45 PM Medical Record Number: [id number] Patient Account Number: [id number] Date of Birth/Sex: Treating RN: 23-Dec-1955 (65 y.o. Jocelyn Sanchez, Jocelyn Sanchez Primary Care Maxamilian Amadon: [name] Other Clinician: Referring Haydn Hutsell: Treating Lochlan Grygiel/Extender: Drucilla Schmidt Weeks in Treatment: 0 Clinic Level of Care Assessment Items TOOL 4 Quantity Score X- 1 0 Use when only an EandM is performed on FOLLOW-UP visit ASSESSMENTS - Nursing Assessment / Reassessment X- 1 10 Reassessment of Co-morbidities (includes updates in patient status) X- 1 5 Reassessment of Adherence to Treatment Plan ASSESSMENTS - Wound and Skin A ssessment / Reassessment X - Simple Wound Assessment / Reassessment - one wound 1 5 []  - 0 Complex Wound Assessment / Reassessment - multiple wounds X- 1 10 Dermatologic / Skin Assessment (not related to wound area) ASSESSMENTS - Focused Assessment []  - 0 Circumferential Edema Measurements - multi extremities X- 1 10 Nutritional Assessment / Counseling / Intervention []  - 0 Lower Extremity Assessment (monofilament, tuning fork, pulses) []  - 0 Peripheral Arterial Disease Assessment (using hand held doppler) ASSESSMENTS - Ostomy and/or Continence Assessment and Care []  - 0 Incontinence Assessment and Management []  - 0 Ostomy Care Assessment and Management (repouching, etc.) PROCESS - Coordination of Care X - Simple Patient / Family Education for ongoing care 1 15 []  - 0 Complex (extensive) Patient / Family Education for ongoing care X- 1 10 Staff obtains Programmer, systems, Records, T Results / Process Orders est X- 1 10 Staff telephones HHA,  Nursing Homes / Clarify orders / etc []  - 0 Routine Transfer to another Facility (non-emergent condition) []  - 0 Routine Hospital Admission (non-emergent condition) X- 1 15 New Admissions / Biomedical engineer / Ordering NPWT Apligraf, etc. , []  - 0 Emergency  Hospital Admission (emergent condition) X- 1 10 Simple Discharge Coordination []  - 0 Complex (extensive) Discharge Coordination PROCESS - Special Needs []  - 0 Pediatric / Minor Patient Management []  - 0 Isolation Patient Management []  - 0 Hearing / Language / Visual special needs []  - 0 Assessment of Community assistance (transportation, D/C planning, etc.) []  - 0 Additional assistance / Altered mentation []  - 0 Support Surface(s) Assessment (bed, cushion, seat, etc.) INTERVENTIONS - Wound Cleansing / Measurement X- 1 5 Simple Wound Cleansing - one wound []  - 0 Complex Wound Cleansing - multiple wounds X- 1 5 Wound Imaging (photographs - any number of wounds) []  - 0 Wound Tracing (instead of photographs) X- 1 5 Simple Wound Measurement - one wound []  - 0 Complex Wound Measurement - multiple wounds INTERVENTIONS - Wound Dressings []  - 0 Small Wound Dressing one or multiple wounds X- 1 15 Medium Wound Dressing one or multiple wounds []  - 0 Large Wound Dressing one or multiple wounds []  - 0 Application of Medications - topical []  - 0 Application of Medications - injection INTERVENTIONS - Miscellaneous []  - 0 External ear exam []  - 0 Specimen Collection (cultures, biopsies, blood, body fluids, etc.) []  - 0 Specimen(s) / Culture(s) sent or taken to Lab for analysis []  - 0 Patient Transfer (multiple staff / Civil Service fast streamer / Similar devices) []  - 0 Simple Staple / Suture removal (25 or less) []  - 0 Complex Staple / Suture removal (26 or more) []  - 0 Hypo / Hyperglycemic Management (close monitor of Blood Glucose) []  - 0 Ankle / Brachial Index (ABI) - do not check if billed separately X- 1 5 Vital  Signs Has the patient been seen at the hospital within the last three years: Yes Total Score: 135 Level Of Care: New/Established - Level 4 Electronic Signature(s) Signed: 10/22/2020 3:14:58 PM By: Rhae Hammock RN Entered By: Rhae Hammock on 10/19/2020 16:10:09 -------------------------------------------------------------------------------- Encounter Discharge Information Details Patient Name: Date of Service: Jocelyn Sanchez [name] 10/19/2020 2:45 PM Medical Record Number: [id number] Patient Account Number: [id number] Date of Birth/Sex: Treating RN: 1956/02/06 (65 y.o. [name] Primary Care Wavie Hashimi: [name] Other Clinician: Referring Amante Fomby: Treating Donique Hammonds/Extender: Alycia Rossetti in Treatment: 0 Encounter Discharge Information Items Discharge Condition: Stable Ambulatory Status: Wheelchair Discharge Destination: Home Transportation: Private Auto Schedule Follow-up Appointment: Yes Clinical Summary of Care: Provided on 10/19/2020 Form Type Recipient Paper Patient Patient Electronic Signature(s) Signed: 10/19/2020 4:34:51 PM By: Lorrin Jackson Entered By: Lorrin Jackson on 10/19/2020 16:34:51 -------------------------------------------------------------------------------- Multi Wound Chart Details Patient Name: Date of Service: Jocelyn Sanchez [name] 10/19/2020 2:45 PM Medical Record Number: [id number] Patient Account Number: [id number] Date of Birth/Sex: Treating RN: 03/30/1956 (65 y.o. Jocelyn Sanchez, Jocelyn Sanchez Primary Care Ivor Kishi: [name] Other Clinician: Referring Ariauna Farabee: Treating Haleemah Buckalew/Extender: Drucilla Schmidt Weeks in Treatment: 0 Vital Signs Height(in): 63 Pulse(bpm): 121 Weight(lbs): 142 Blood Pressure(mmHg): 125/81 Body Mass Index(BMI): 25 Temperature(F): 97.7 Respiratory Rate(breaths/min): 16 Photos: [3:No Photos Sacrum] [N/A:N/A N/A] Wound Location: [3:Pressure Injury]  [N/A:N/A] Wounding Event: [3:Pressure Ulcer] [N/A:N/A] Primary Etiology: [3:07/20/2020] [N/A:N/A] Date Acquired: [3:0] [N/A:N/A] Weeks of Treatment: [3:Open] [N/A:N/A] Wound Status: [3:3x2.5x1.5] [N/A:N/A] Measurements L x W x D (cm) [3:5.89] [N/A:N/A] A (cm) : rea [3:8.836] [N/A:N/A] Volume (cm) : [3:12] Starting Position 1 (o'clock): [3:4] Ending Position 1 (o'clock): [3:2] Maximum Distance 1 (cm): [3:Yes] [N/A:N/A] Undermining: [3:Category/Stage III] [N/A:N/A] Classification: [3:Medium] [N/A:N/A] Exudate A mount: [3:Serosanguineous] [N/A:N/A] Exudate Type: [3:red, brown] [N/A:N/A]  Exudate Color: [3:Well defined, not attached] [N/A:N/A] Wound Margin: [3:Large (67-100%)] [N/A:N/A] Granulation A mount: [3:Red, Pink] [N/A:N/A] Granulation Quality: [3:Small (1-33%)] [N/A:N/A] Necrotic A mount: [3:Fat Layer (Subcutaneous Tissue): Yes N/A] Exposed Structures: [3:Fascia: No Tendon: No Muscle: No Joint: No Bone: No None] [N/A:N/A] Treatment Notes Electronic Signature(s) Signed: 10/19/2020 4:28:01 PM By: Linton Ham MD Signed: 10/22/2020 3:14:58 PM By: Rhae Hammock RN Entered By: Linton Ham on 10/19/2020 16:05:11 -------------------------------------------------------------------------------- Multi-Disciplinary Care Plan Details Patient Name: Date of Service: Jocelyn Sanchez [name] 10/19/2020 2:45 PM Medical Record Number: [id number] Patient Account Number: [id number] Date of Birth/Sex: Treating RN: 28-May-1956 (65 y.o. Jocelyn Sanchez, Jocelyn Sanchez Primary Care Rainee Sweatt: [name] Other Clinician: Referring Evora Schechter: Treating Marcena Dias/Extender: Drucilla Schmidt Weeks in Treatment: 0 Active Inactive Orientation to the Wound Care Program Nursing Diagnoses: Knowledge deficit related to the wound healing center program Goals: Patient/caregiver will verbalize understanding of the [place] Date Initiated: 10/19/2020 Target Resolution Date:  11/20/2020 Goal Status: Active Interventions: Provide education on orientation to the wound center Notes: Wound/Skin Impairment Nursing Diagnoses: Impaired tissue integrity Knowledge deficit related to ulceration/compromised skin integrity Goals: Patient will have a decrease in wound volume by X% from date: (specify in notes) Date Initiated: 10/19/2020 Target Resolution Date: 11/20/2020 Goal Status: Active Patient/caregiver will verbalize understanding of skin care regimen Date Initiated: 10/19/2020 Target Resolution Date: 11/20/2020 Goal Status: Active Ulcer/skin breakdown will have a volume reduction of 30% by week 4 Date Initiated: 10/19/2020 Target Resolution Date: 11/19/2020 Goal Status: Active Ulcer/skin breakdown will have a volume reduction of 50% by week 8 Date Initiated: 10/19/2020 Target Resolution Date: 11/18/2020 Goal Status: Active Interventions: Assess patient/caregiver ability to obtain necessary supplies Assess patient/caregiver ability to perform ulcer/skin care regimen upon admission and as needed Assess ulceration(s) every visit Provide education on ulcer and skin care Notes: Electronic Signature(s) Signed: 10/22/2020 3:14:58 PM By: Rhae Hammock RN Entered By: Rhae Hammock on 10/19/2020 15:57:48 -------------------------------------------------------------------------------- Pain Assessment Details Patient Name: Date of Service: Jocelyn Sanchez [name] 10/19/2020 2:45 PM Medical Record Number: [id number] Patient Account Number: [id number] Date of Birth/Sex: Treating RN: 05/22/1956 (65 y.o. Jocelyn Sanchez Primary Care Elhadj Girton: [name] Other Clinician: Referring Rhylen Pulido: Treating Jaccob Czaplicki/Extender: Drucilla Schmidt Weeks in Treatment: 0 Active Problems Location of Pain Severity and Description of Pain Patient Has Paino No Site Locations Pain Management and Medication Current Pain Management: Electronic Signature(s) Signed: 10/21/2020  5:41:41 PM By: Levan Hurst RN, BSN Entered By: Levan Hurst on 10/19/2020 15:17:39 -------------------------------------------------------------------------------- Patient/Caregiver Education Details Patient Name: Date of Service: Jocelyn Sanchez [name] 5/3/2022andnbsp2:45 PM Medical Record Number: [id number] Patient Account Number: [id number] Date of Birth/Gender: Treating RN: [birth year] (65 y.o. Jocelyn Sanchez, Jocelyn Sanchez Primary Care Physician: [name] Other Clinician: Referring Physician: Treating Physician/Extender: Alycia Rossetti in Treatment: 0 Education Assessment Education Provided To: Patient Education Topics Provided Welcome T [place]: o Methods: Explain/Verbal Responses: State content correctly Wound/Skin Impairment: Methods: Explain/Verbal Responses: State content correctly Electronic Signature(s) Signed: 10/22/2020 3:14:58 PM By: Rhae Hammock RN Entered By: Rhae Hammock on 10/19/2020 15:58:29 -------------------------------------------------------------------------------- Wound Assessment Details Patient Name: Date of Service: Jocelyn Sanchez [name] 10/19/2020 2:45 PM Medical Record Number: [id number] Patient Account Number: [id number] Date of Birth/Sex: Treating RN: Oct 08, 1955 (65 y.o. Jocelyn Sanchez Primary Care Kevron Patella: [name] Other Clinician: Referring Geoffry Bannister: Treating Marzetta Lanza/Extender: Drucilla Schmidt Weeks in Treatment: 0 Wound Status Wound  Number: 3 Primary Pressure Ulcer Etiology: Wound Location: Sacrum Wound Open Wounding Event: Pressure Injury Status: Date Acquired: 07/20/2020 Comorbid Anemia, Deep Vein Thrombosis, Hypertension, Peripheral Weeks Of Treatment: 0 History: Venous Disease, Type II Diabetes Clustered Wound: No Photos Wound Measurements Length: (cm) 3 Width: (cm) 2.5 Depth: (cm) 1.5 Area: (cm) 5.89 Volume: (cm) 8.836 % Reduction in Area: 0% %  Reduction in Volume: 0% Epithelialization: None Tunneling: No Undermining: Yes Starting Position (o'clock): 12 Ending Position (o'clock): 4 Maximum Distance: (cm) 2 Wound Description Classification: Category/Stage III Wound Margin: Well defined, not attached Exudate Amount: Medium Exudate Type: Serosanguineous Exudate Color: red, brown Foul Odor After Cleansing: No Slough/Fibrino Yes Wound Bed Granulation Amount: Large (67-100%) Exposed Structure Granulation Quality: Red, Pink Fascia Exposed: No Necrotic Amount: Small (1-33%) Fat Layer (Subcutaneous Tissue) Exposed: Yes Necrotic Quality: Adherent Slough Tendon Exposed: No Muscle Exposed: No Joint Exposed: No Bone Exposed: No Treatment Notes Wound #3 (Sacrum) Cleanser Soap and Water Discharge Instruction: May shower and wash wound with dial antibacterial soap and water prior to dressing change. Wound Cleanser Discharge Instruction: Cleanse the wound with wound cleanser prior to applying a clean dressing using gauze sponges, not tissue or cotton balls. Anasept Antimicrobial Skin and Wound Cleanser, 8 (oz) Discharge Instruction: Cleanse the wound with Anasept cleanser prior to applying a clean dressing using gauze sponges, not tissue or cotton balls. Peri-Wound Care Skin Prep Discharge Instruction: Use skin prep as directed Topical Primary Dressing Promogran Prisma Matrix, 4.34 (sq in) (silver collagen) Discharge Instruction: Moisten collagen with saline or hydrogel Pack with saline moistened gauze on top of prisma Secondary Dressing Woven Gauze Sponge, Non-Sterile 4x4 in Discharge Instruction: Apply over primary dressing as directed. MPM Excel SAP Bordered Dressing, 7x6.7 (Sacral) (in/in) Discharge Instruction: Apply silicone border over primary dressing as directed. Secured With Compression Wrap Compression Stockings Environmental education officer) Signed: 10/19/2020 4:47:39 PM By: Sandre Kitty Signed: 10/21/2020  5:41:41 PM By: Levan Hurst RN, BSN Entered By: Sandre Kitty on 10/19/2020 16:46:09 -------------------------------------------------------------------------------- Vitals Details Patient Name: Date of Service: Jocelyn Sanchez [name] 10/19/2020 2:45 PM Medical Record Number: [id number] Patient Account Number: [id number] Date of Birth/Sex: Treating RN: [birth year] (65 y.o. Jocelyn Sanchez Primary Care Coston Mandato: [name] Other Clinician: Referring Sherilyn Windhorst: Treating Kenzey Birkland/Extender: Drucilla Schmidt Weeks in Treatment: 0 Vital Signs Time Taken: 15:00 Temperature (F): 97.7 Height (in): 63 Pulse (bpm): 121 Source: Stated Respiratory Rate (breaths/min): 16 Weight (lbs): 142 Blood Pressure (mmHg): 125/81 Source: Stated Reference Range: 80 - 120 mg / dl Body Mass Index (BMI): 25.2 Electronic Signature(s) Signed: 10/21/2020 5:41:41 PM By: Levan Hurst RN, BSN Entered By: Levan Hurst on 10/19/2020 15:02:08

## 2020-10-22 NOTE — Progress Notes (Signed)
SHAYLEE, STANISLAWSKI ([id number]) Visit Report for 10/19/2020 Chief Complaint Document Details Patient Name: Date of Service: Jocelyn Sanchez [name] 10/19/2020 2:45 PM Medical Record Number: [id number] Patient Account Number: [id number] Date of Birth/Sex: Treating RN: [birth year] (65 y.o. [name], [name] Primary Care Provider: [name] Other Clinician: Referring Provider: Treating Provider/Extender: Drucilla Schmidt Weeks in Treatment: 0 Information Obtained from: Patient Chief Complaint evaluation for sacral pressure ulcer 10/19/2020; patient is here for review of a sacral pressure ulcer which I think is in the same position as last time Electronic Signature(s) Signed: 10/19/2020 4:28:01 PM By: Linton Ham MD Entered By: Linton Ham on 10/19/2020 16:05:46 -------------------------------------------------------------------------------- HPI Details Patient Name: Date of Service: Jocelyn Sanchez [name] 10/19/2020 2:45 PM Medical Record Number: [id number] Patient Account Number: [id number] Date of Birth/Sex: Treating RN: 03-08-1956 (65 y.o. [name], [name] Primary Care Provider: [name] Other Clinician: Referring Provider: Treating Provider/Extender: Drucilla Schmidt Weeks in Treatment: 0 History of Present Illness Location: Midline sacral region Quality: Patient describes this as a soreness Severity: 2 out of 10 Duration: Greater than one year prior to presentation to the wound center Timing: Pain occurs most often with dressing changes Context: Developmental result of pressure over time to the sacral region Modifying Factors: Patient has previously had this wound packed with gauze, she has had a wound VAC, and she tells me other various wound care treatments over the past year. ssociated Signs and Symptoms: Multiple sclerosis, hypertension, diabetes mellitus type 2, lymphedema A HPI Description: 03/27/16 patient presents today on  initial evaluation concerning her sacral pressure injury which has been present for roughly one year. She tells me that she does not know of any x-rays or other advanced imaging has been performed in regard to the sacral region. She has been provided with wound care at the [facility] facility at this point in time. She has MS and is not able to move she tells me "at all". She does tell me however that the facility is very good about re-positioning her at least every 2 hours. With that being said she did fell the wound VAC apparently. She had this twice the first time she tells me that it did not seem to be working very well and the second time it actually made things worse this was just 2 months ago. Therefore this was discontinued. at this point in time today she is not having any signs or symptoms of systemic infection she is on a blood thinner which is Eliquis at this point in time. 03/24/16; patient admitted to our clinic by Jeri Cos last week. She is a lady with advanced MS who is had a wound on her lower sacral area for most of the last year. She reiterates to me that wound vacs have not worked for her in fact she states they've actually made her wound worse on the last occasion. An x- ray was apparently done in the facility although I don't have these results. 03/31/16; this patient had an x-ray done at the facility that did not show evidence of osteomyelitis. Her bone biopsy did not show osteomyelitis. 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 She had been suggested to have collagen on the base of the wound covered with normal saline wet  to dry although her intake nurse reports there was no collagen on the wound surface. 05/15/16 wound is not changed that much. This is a substantial stage IV  wound with considerable undermining from 7 to 3:00. I have spoken to the wound care nurse at the [facility] facility, there was no records on what exactly caused the wound VAC to fail or records of the same. 06/05/16; I received communication from [facility] skilled facility reporting that the patient refuses the wound back more often that it's on. When I confronted her with this today in the room she denied that she was responsible stating for the most part the problem is because she has trouble getting them to deal with her Foley catheter properly. I don't exactly follow her line of reasoning here however apparently they've been putting hydrogel wet to dry and in the facility 07/03/16; I have not heard anything from [facility]. The patient states she has not had a wound VAC the last 2-3 days due to "battery problems". She arrives today with wet to dry dressing. She has advanced MS and is a chronic care resident at the skilled facility 07/31/16; patient is a resident of [facility] skilled facility. She has had the VAC applied but did not arrive with the [facility] in place. Her wound generally looks better perhaps somewhat less tunneling superiorly. There is still some exposed bone however. 09/11/16; I follow this patient with advanced MS who is a resident of [facility] skilled facility on a monthly basis. She arrives 6 weeks after her last visit. The wound VAC apparently is no longer being used according to the patient because the nurses in the facility could not maintain a seal in this area. Via the patient it seems that they're using some form of wet to dry dressing although he received no documentation. The patient states she is eating well she is being turned in the facility. 10/09/16; I follow this patient with advanced MS who is a resident of [facility]. She apparently could not have a wound VAC is the facility could not maintain a seal therefore they're using wet-to-dry dressings however the patient  arrives today with better measurements and a nicely granulated wound bed. 11/05/16; patient with advanced MS was a pressure area on the lower sacrum/coccyx. She could not have a wound VAC because the facility could n/ot maintain a seal. I ordered Prisma last time. Wound dimensions are about the same per intake 12/04/16; pressure area on the lower sacrum. using collagen. Has a new concern on the right great toe 01/01/17; patient has now only a small open area on the lower sacrumcoccyx she is been doing well and this wound is gradually progressing towards closure. The area on the right great toe that looked to me like a paronychia last time is also quite a bit better although the nail here looks as though it's loosening READMISSION 10/19/2020 This is a patient with advanced multiple sclerosis. We had her in clinic for a prolonged period in late 2017 to the summer 2018 with a stage IV wound on her lower sacrum/coccyx. I do not think we healed her out although her wound was a lot better. She was a resident at [facility] skilled facility at the time The patient states the wound reopened about 3 or 4 months ago. I am not completely certain from talking to the patient if this is in the same position as last time or even indeed if it ever closed. This is a stage IV wound with exposed bone  at 3:00 I think they are using silver alginate base dressing she has home health 1 time a week using Aquacel. She has a home health aide who comes twice a day. She had an air mattress but apparently it does not work As far as I can tell there is not been any major change in her medical problem list except she lives at home now. She is a diabetic on metformin Electronic Signature(s) Signed: 10/19/2020 4:28:01 PM By: Baltazar Najjar MD Entered By: Baltazar Najjar on 10/19/2020 16:09:25 -------------------------------------------------------------------------------- Physical Exam Details Patient Name: Date of Service: Jocelyn Sanchez [name] 10/19/2020 2:45 PM Medical Record Number: [id number] Patient Account Number: [id number] Date of Birth/Sex: Treating RN: Dec 04, 1955 (65 y.o. Ardis Rowan, [name] Primary Care Provider: Ricki Rodriguez Other Clinician: Referring Provider: Treating Provider/Extender: Valentina Gu Weeks in Treatment: 0 Constitutional Sitting or standing Blood Pressure is within target range for patient.. Pulse regular and within target range for patient.Marland Kitchen Respirations regular, non-labored and within target range.. Temperature is normal and within the target range for the patient.Marland Kitchen Appears in no distress. Gastrointestinal (GI) Soft no masses. Integumentary (Hair, Skin) No primary skin lesions are seen. Neurological Advanced MS effectively quadriparetic. Notes Wound exam; over the lower sacrum. Large wound relatively healthy granulation. At 3:00 there is palpable bone. No gross evidence of infection. Electronic Signature(s) Signed: 10/19/2020 4:28:01 PM By: Baltazar Najjar MD Entered By: Baltazar Najjar on 10/19/2020 16:11:00 -------------------------------------------------------------------------------- Physician Orders Details Patient Name: Date of Service: Jocelyn Sanchez [name] 10/19/2020 2:45 PM Medical Record Number: [id number] Patient Account Number: [id number] Date of Birth/Sex: Treating RN: Sep 01, 1955 (65 y.o. Ardis Rowan, [name] Primary Care Provider: Ricki Rodriguez Other Clinician: Referring Provider: Treating Provider/Extender: Valentina Gu Weeks in Treatment: 0 Verbal / Phone Orders: No Diagnosis Coding Follow-up Appointments Return appointment in 1 month. - Dr. Leanord Hawking Bathing/ Shower/ Hygiene May shower with protection but do not get wound dressing(s) wet. - May shower and use soap and water on days dressings are changed Off-Loading Low air-loss mattress (Group 2) - We will send a prescription and an order for this today Turn and reposition  every 2 hours Home Health New wound care orders this week; continue Home Health for wound care. May utilize formulary equivalent dressing for wound treatment orders unless otherwise specified. - Home health to change Monday's, Wednesday;s, and Friday's!! Wound Treatment Wound #3 - Sacrum Cleanser: Soap and Water [facility]) Every Other Day/30 Days Discharge Instructions: May shower and wash wound with dial antibacterial soap and water prior to dressing change. Cleanser: Wound Cleanser [facility]) Every Other Day/30 Days Discharge Instructions: Cleanse the wound with wound cleanser prior to applying a clean dressing using gauze sponges, not tissue or cotton balls. Cleanser: Anasept Antimicrobial Skin and Wound Cleanser, 8 (oz) (Home Health) Every Other Day/30 Days Discharge Instructions: Cleanse the wound with Anasept cleanser prior to applying a clean dressing using gauze sponges, not tissue or cotton balls. Peri-Wound Care: Skin Prep [facility]) Every Other Day/30 Days Discharge Instructions: Use skin prep as directed Prim Dressing: Promogran Prisma Matrix, 4.34 (sq in) (silver collagen) (Home Health) Every Other Day/30 Days ary Discharge Instructions: Moisten collagen with saline or hydrogel Prim Dressing: Pack with saline moistened gauze on top of prisma [name]) Every Other Day/30 Days ary Secondary Dressing: Woven Gauze Sponge, Non-Sterile 4x4 in [facility]) Every Other Day/30 Days Discharge Instructions: Apply over primary dressing as directed. Secondary Dressing: MPM Excel SAP  Bordered Dressing, 7x6.7 (Sacral) (in/in) (Home Health) Every Other Day/30 Days Discharge Instructions: Apply silicone border over primary dressing as directed. Electronic Signature(s) Signed: 10/19/2020 4:28:01 PM By: Linton Ham MD Signed: 10/22/2020 3:14:58 PM By: Rhae Hammock RN Entered By: Rhae Hammock on 10/19/2020  16:05:59 -------------------------------------------------------------------------------- Problem List Details Patient Name: Date of Service: Jocelyn Sanchez [name] 10/19/2020 2:45 PM Medical Record Number: [id number] Patient Account Number: [id number] Date of Birth/Sex: Treating RN: [birth year] (65 y.o. [name], [name] Primary Care Provider: [name] Other Clinician: Referring Provider: Treating Provider/Extender: Drucilla Schmidt Weeks in Treatment: 0 Active Problems ICD-10 Encounter Code Description Active Date MDM Diagnosis L89.154 Pressure ulcer of sacral region, stage 4 10/19/2020 No Yes G35 Multiple sclerosis 10/19/2020 No Yes Inactive Problems Resolved Problems Electronic Signature(s) Signed: 10/19/2020 4:28:01 PM By: Linton Ham MD Entered By: Linton Ham on 10/19/2020 16:04:54 -------------------------------------------------------------------------------- Progress Note Details Patient Name: Date of Service: Jocelyn Sanchez [name] 10/19/2020 2:45 PM Medical Record Number: [id number] Patient Account Number: [id number] Date of Birth/Sex: Treating RN: 04/02/1956 (65 y.o. [name], [name] Primary Care Provider: [name] Other Clinician: Referring Provider: Treating Provider/Extender: Drucilla Schmidt Weeks in Treatment: 0 Subjective Chief Complaint Information obtained from Patient evaluation for sacral pressure ulcer 10/19/2020; patient is here for review of a sacral pressure ulcer which I think is in the same position as last time History of Present Illness (HPI) The following HPI elements were documented for the patient's wound: Location: Midline sacral region Quality: Patient describes this as a soreness Severity: 2 out of 10 Duration: Greater than one year prior to presentation to the wound center Timing: Pain occurs most often with dressing changes Context: Developmental result of pressure over time to the sacral  region Modifying Factors: Patient has previously had this wound packed with gauze, she has had a wound VAC, and she tells me other various wound care treatments over the past year. Associated Signs and Symptoms: Multiple sclerosis, hypertension, diabetes mellitus type 2, lymphedema 03/27/16 patient presents today on initial evaluation concerning her sacral pressure injury which has been present for roughly one year. She tells me that she does not know of any x-rays or other advanced imaging has been performed in regard to the sacral region. She has been provided with wound care at the [facility] facility at this point in time. She has MS and is not able to move she tells me "at all". She does tell me however that the facility is very good about re-positioning her at least every 2 hours. With that being said she did fell the wound VAC apparently. She had this twice the first time she tells me that it did not seem to be working very well and the second time it actually made things worse this was just 2 months ago. Therefore this was discontinued. at this point in time today she is not having any signs or symptoms of systemic infection she is on a blood thinner which is Eliquis at this point in time. 03/24/16; patient admitted to our clinic by Jeri Cos last week. She is a lady with advanced MS who is had a wound on her lower sacral area for most of the last year. She reiterates to me that wound vacs have not worked for her in fact she states they've actually made her wound worse on the last occasion. An x- ray was apparently done in the facility although I don't have these results.  03/31/16; this patient had an x-ray done at the facility that did not show evidence of osteomyelitis. Her bone biopsy did not show osteomyelitis. 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 We are using collagen moist gauze border foam change every  second day 05/01/16; substantial stage IV wound in a patient with advanced MS. She had been suggested to have collagen on the base of the wound covered with normal saline wet to dry although her intake nurse reports there was no collagen on the wound surface. 05/15/16 wound is not changed that much. This is a substantial stage IV wound with considerable undermining from 7 to 3:00. I [facility], there was no records on what exactly caused the wound VAC to fail or records of the same. 06/05/16; I received communication from [facility] reporting that the patient refuses the wound back more often that it's on. When I confronted her with this today in the room she denied that she was responsible stating for the most part the problem is because she has trouble getting them to deal with her Foley catheter properly. I don't exactly follow her line of reasoning here however apparently they've been putting hydrogel wet to dry and in the facility 07/03/16; I have not heard anything from [facility]. The patient states she has not had a wound VAC the last 2-3 days due to "battery problems". She arrives today with wet to dry dressing. She has advanced MS and is a chronic care resident at the skilled facility 07/31/16; patient is a resident [facility]. She has had the [facility]AC applied but [facility] Her wound generally looks better perhaps somewhat less tunneling superiorly. There is still some exposed bone however. 09/11/16; I follow this patient with advanced MS who is a resident [facility] on a monthly basis. She arrives 6 weeks after her last visit. The wound VAC apparently is no longer being used according to the patient because the nurses in the facility could not maintain a seal in this area. Via the patient it seems that they're using some form of wet to dry dressing although he  received no documentation. The patient states she is eating well she is being turned in the facility. 10/09/16; I follow this patient with advanced MS who [facility]. She apparently could not have a wound VAC is the facility could not maintain a seal therefore they're using wet-to-dry dressings however the patient arrives today with better measurements and a nicely granulated wound bed. 11/05/16; patient with advanced MS was a pressure area on the lower sacrum/coccyx. She could not have a wound VAC because the facility could n/ot maintain a seal. I ordered Prisma last time. Wound dimensions are about the same per intake 12/04/16; pressure area on the lower sacrum. using collagen. Has a new concern on the right great toe 01/01/17; patient has now only a small open area on the lower sacrumoococcyx she is been doing well and this wound is gradually progressing towards closure. The area on the right great toe that looked to me like a paronychia last time is also quite a bit better although the nail here looks as though it's loosening READMISSION 10/19/2020 This is a patient with advanced multiple sclerosis. We had her in clinic for a prolonged period in late 2017 to the summer 2018 with a stage  IV wound on her lower sacrum/coccyx. I do not think we healed her out although her wound was a lot better. She was a resident at [facility] skilled facility at the time The patient states the wound reopened about 3 or 4 months ago. I am not completely certain from talking to the patient if this is in the same position as last time or even indeed if it ever closed. 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 She has a home health aide who comes twice a day. She had an air mattress but apparently it does not work As far as I can tell there is not been any major change in her medical problem list  except she lives at home now. She is a diabetic on metformin Patient History Information obtained from Patient. Allergies Sulfa (Sulfonamide Antibiotics) (Reaction: shortness of breath) Family History No family history of Cancer, Diabetes, Heart Disease, Hypertension, Kidney Disease, Lung Disease, Seizures, Stroke, Thyroid Problems, Tuberculosis. Social History Former smoker, Marital Status - Divorced, Alcohol Use - Never, Drug Use - No History, Caffeine Use - Rarely. Medical History Hematologic/Lymphatic Patient has history of Anemia Denies history of Lymphedema Cardiovascular Patient has history of Deep Vein Thrombosis - (R) leg, Hypertension, Peripheral Venous Disease Endocrine Patient has history of Type II Diabetes Integumentary (Skin) Denies history of History of Burn Psychiatric Denies history of Anorexia/bulimia, Confinement Anxiety Patient is treated with Oral Agents. Blood sugar is not tested. 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 Eyes Denies complaints or symptoms of Dry Eyes, Vision Changes, Glasses / Contacts. Ear/Nose/Mouth/Throat Denies complaints or symptoms of Chronic sinus problems or rhinitis. Respiratory Denies complaints or symptoms of Chronic or frequent coughs, Shortness of Breath. Gastrointestinal Denies complaints or symptoms of Frequent diarrhea, Nausea, Vomiting. Genitourinary Denies complaints or symptoms of Frequent urination. Integumentary (Skin) Complains or has symptoms of Wounds - wound on sacrum. Psychiatric Denies complaints or symptoms of Claustrophobia, Suicidal. Objective Constitutional Sitting or standing Blood Pressure is within  target range for patient.. Pulse regular and within target range for patient.Marland Kitchen Respirations regular, non-labored and within target range.. Temperature is normal and within the target range for the patient.Marland Kitchen Appears in no distress. Vitals Time Taken: 3:00 PM, Height: 63 in, Source: Stated, Weight: 142 lbs, Source: Stated, BMI: 25.2, Temperature: 97.7 F, Pulse: 121 bpm, Respiratory Rate: 16 breaths/min, Blood Pressure: 125/81 mmHg. Gastrointestinal (GI) Soft no masses. Neurological Advanced MS effectively quadriparetic. General Notes: Wound exam; over the lower sacrum. Large wound relatively healthy granulation. At 3:00 there is palpable bone. No gross evidence of infection. Integumentary (Hair, Skin) No primary skin lesions are seen. Wound #3 status is Open. Original cause of wound was Pressure Injury. The date acquired was: 07/20/2020. The wound is located on the Sacrum. The wound measures 3cm length x 2.5cm width x 1.5cm depth; 5.89cm^2 area and 8.836cm^3 volume. There is Fat Layer (Subcutaneous Tissue) exposed. There is no tunneling noted, however, there is undermining starting at 12:00 and ending at 4:00 with a maximum distance of 2cm. There is a medium amount of serosanguineous drainage noted. The wound margin is well defined and not attached to the wound  base. There is large (67-100%) red, pink granulation within the wound bed. There is a small (1-33%) amount of necrotic tissue within the wound bed including Adherent Slough. Assessment Active Problems ICD-10 Pressure ulcer of sacral region, stage 4 Multiple sclerosis Plan Follow-up Appointments: Return appointment in 1 month. - Dr. Dellia Nims Bathing/ Shower/ Hygiene: May shower with protection but do not get wound dressing(s) wet. - May shower and use soap and water on days dressings are changed Off-Loading: Low air-loss mattress (Group 2) - We will send a prescription and an order for this today Turn and reposition every 2 hours Home  Health: New wound care orders this week; continue Home Health for wound care. May utilize formulary equivalent dressing for wound treatment orders unless otherwise specified. - Home health to change Monday's, Wednesday;s, and Friday's!! WOUND #3: - Sacrum Wound Laterality: Cleanser: Soap and Water [facility]) Every Other Day/30 Days Discharge Instructions: May shower and wash wound with dial antibacterial soap and water prior to dressing change. Cleanser: Wound Cleanser [facility]) Every Other Day/30 Days Discharge Instructions: Cleanse the wound with wound cleanser prior to applying a clean dressing using gauze sponges, not tissue or cotton balls. Cleanser: Anasept Antimicrobial Skin and Wound Cleanser, 8 (oz) (Home Health) Every Other Day/30 Days Discharge Instructions: Cleanse the wound with Anasept cleanser prior to applying a clean dressing using gauze sponges, not tissue or cotton balls. Peri-Wound Care: Skin Prep [facility]) Every Other Day/30 Days Discharge Instructions: Use skin prep as directed Prim Dressing: Promogran Prisma Matrix, 4.34 (sq in) (silver collagen) (Home Health) Every Other Day/30 Days ary Discharge Instructions: Moisten collagen with saline or hydrogel Prim Dressing: Pack with saline moistened gauze on top of prisma [facility]) Every Other Day/30 Days ary Secondary Dressing: Woven Gauze Sponge, Non-Sterile 4x4 in [facility] Every Other Day/30 Days Discharge Instructions: Apply over primary dressing as directed. Secondary Dressing: MPM Excel SAP Bordered Dressing, 7x6.7 (Sacral) (in/in) (Home Health) Every Other Day/30 Days Discharge Instructions: Apply silicone border over primary dressing as directed. #1 stage IV pressure ulcer. Again I am really uncertain about the history here but what the patient told us that this opened again 3 to 4 months ago. She has home health applying Aquacel Ag and she has an in-home aide who comes twice daily #2 we will  reorder a pressure relief surface for the patient 3. I am going to use silver collagen with backing wet-to-dry and bordered foam 4. Follow-up in 1 month. I had some discussion with her about a wound VAC. We tried this the last time the patient was here in a nursing home and I remember it did not go well for 1 reason or another. Apparently the nursing home could not maintain a seal although it looks like a wound that should benefit. If we make no progress with standard dressings it might be worth another try 5. The patient tells me she spends almost all day in bed she does not get up in the wheelchair Electronic Signature(s) Signed: 10/19/2020 4:28:01 PM By: Linton Ham MD Entered By: Linton Ham on 10/19/2020 16:14:35 -------------------------------------------------------------------------------- HxROS Details Patient Name: Date of Service: Jocelyn Sanchez [name] 10/19/2020 2:45 PM Medical Record Number: [id number] Patient Account Number: [id number] Date of Birth/Sex: Treating RN: 1955/11/20 (65 y.o. [name] Primary Care Provider: [name] Other Clinician: Referring Provider: Treating Provider/Extender: Drucilla Schmidt Weeks in Treatment: 0 Information Obtained From Patient Constitutional Symptoms (General Health) Complaints and Symptoms: Negative  for: Fatigue; Fever; Chills; Marked Weight Change Eyes Complaints and Symptoms: Negative for: Dry Eyes; Vision Changes; Glasses / Contacts Ear/Nose/Mouth/Throat Complaints and Symptoms: Negative for: Chronic sinus problems or rhinitis Respiratory Complaints and Symptoms: Negative for: Chronic or frequent coughs; Shortness of Breath Medical History: Past Medical History Notes: h/o bilat pulmonary embolism Gastrointestinal Complaints and Symptoms: Negative for: Frequent diarrhea; Nausea; Vomiting Genitourinary Complaints and Symptoms: Negative for: Frequent urination Integumentary  (Skin) Complaints and Symptoms: Positive for: Wounds - wound on sacrum Medical History: Negative for: History of Burn Psychiatric Complaints and Symptoms: Negative for: Claustrophobia; Suicidal Medical History: Negative for: Anorexia/bulimia; Confinement Anxiety Past Medical History Notes: anxiety Hematologic/Lymphatic Medical History: Positive for: Anemia Negative for: Lymphedema Cardiovascular Medical History: Positive for: Deep Vein Thrombosis - (R) leg; Hypertension; Peripheral Venous Disease Endocrine Medical History: Positive for: Type II Diabetes Time with diabetes: 8 years Treated with: Oral agents Blood sugar tested every day: No Immunological Musculoskeletal Medical History: Past Medical History Notes: MS , 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) Neurologic Medical History: Past Medical History Notes: Multiple Sclerosis Oncologic Immunizations Pneumococcal Vaccine: Received Pneumococcal Vaccination: Yes Immunization Notes: last tetanus about 3 years ago Implantable Devices None Family and Social History Cancer: No; Diabetes: No; Heart Disease: No; Hypertension: No; Kidney Disease: No; Lung Disease: No; Seizures: No; Stroke: No; Thyroid Problems: No; Tuberculosis: No; Former smoker; Marital Status - Divorced; Alcohol Use: Never; Drug Use: No History; Caffeine Use: Rarely; Financial Concerns: No; Food, Clothing or Shelter Needs: No; Support System Lacking: No; Transportation Concerns: No Engineer, maintenance) Signed: 10/19/2020 4:28:01 PM By: Linton Ham MD Signed: 10/21/2020 5:41:41 PM By: Levan Hurst RN, BSN Entered By: Levan Hurst on 10/19/2020 15:15:56 -------------------------------------------------------------------------------- [place] Details Patient Name: Date of Service: Jocelyn Sanchez [name] 10/19/2020 Medical Record Number: [id number] Patient Account Number: [id number] Date of Birth/Sex: Treating RN: 1956-06-18 (64 y.o. [name], [name] Primary Care Provider: [name] Other Clinician: Referring Provider: Treating Provider/Extender: Drucilla Schmidt Weeks in Treatment: 0 Diagnosis Coding ICD-10 Codes Code Description [phone] Pressure ulcer of sacral region, stage 4 G35 Multiple sclerosis Facility Procedures CPT4 Code: [id number] Description: 99214 - WOUND CARE VISIT-LEV 4 EST PT Modifier: Quantity: 1 Physician Procedures : CPT4 Code Description Modifier [id number] [place] PHYS LEVEL 3 NEW PT ICD-10 Diagnosis Description L89.154 Pressure ulcer of sacral region, stage 4 G35 Multiple sclerosis Quantity: 1 Electronic Signature(s) Signed: 10/19/2020 4:28:01 PM By: Linton Ham MD Entered By: Linton Ham on 10/19/2020 16:14:59

## 2020-11-16 ENCOUNTER — Encounter (HOSPITAL_BASED_OUTPATIENT_CLINIC_OR_DEPARTMENT_OTHER): Payer: Medicare Other | Admitting: Internal Medicine

## 2020-11-25 ENCOUNTER — Encounter (HOSPITAL_BASED_OUTPATIENT_CLINIC_OR_DEPARTMENT_OTHER): Payer: Medicare Other | Admitting: Internal Medicine

## 2020-12-02 ENCOUNTER — Encounter (HOSPITAL_BASED_OUTPATIENT_CLINIC_OR_DEPARTMENT_OTHER): Payer: Medicare Other | Attending: Internal Medicine | Admitting: Internal Medicine

## 2020-12-02 DIAGNOSIS — L89154 Pressure ulcer of sacral region, stage 4: Secondary | ICD-10-CM | POA: Insufficient documentation

## 2020-12-02 DIAGNOSIS — E1151 Type 2 diabetes mellitus with diabetic peripheral angiopathy without gangrene: Secondary | ICD-10-CM | POA: Insufficient documentation

## 2020-12-02 DIAGNOSIS — I10 Essential (primary) hypertension: Secondary | ICD-10-CM | POA: Insufficient documentation

## 2020-12-02 DIAGNOSIS — E11622 Type 2 diabetes mellitus with other skin ulcer: Secondary | ICD-10-CM | POA: Insufficient documentation

## 2020-12-02 DIAGNOSIS — I890 Lymphedema, not elsewhere classified: Secondary | ICD-10-CM | POA: Insufficient documentation

## 2020-12-02 DIAGNOSIS — G35 Multiple sclerosis: Secondary | ICD-10-CM | POA: Insufficient documentation

## 2020-12-09 ENCOUNTER — Other Ambulatory Visit: Payer: Self-pay

## 2020-12-09 ENCOUNTER — Encounter (HOSPITAL_BASED_OUTPATIENT_CLINIC_OR_DEPARTMENT_OTHER): Payer: Medicare Other | Admitting: Internal Medicine

## 2020-12-09 DIAGNOSIS — L89154 Pressure ulcer of sacral region, stage 4: Secondary | ICD-10-CM | POA: Diagnosis not present

## 2020-12-09 DIAGNOSIS — G35 Multiple sclerosis: Secondary | ICD-10-CM | POA: Diagnosis not present

## 2020-12-09 DIAGNOSIS — E1151 Type 2 diabetes mellitus with diabetic peripheral angiopathy without gangrene: Secondary | ICD-10-CM | POA: Diagnosis not present

## 2020-12-09 DIAGNOSIS — I890 Lymphedema, not elsewhere classified: Secondary | ICD-10-CM | POA: Diagnosis not present

## 2020-12-09 DIAGNOSIS — I10 Essential (primary) hypertension: Secondary | ICD-10-CM | POA: Diagnosis not present

## 2020-12-09 DIAGNOSIS — E11622 Type 2 diabetes mellitus with other skin ulcer: Secondary | ICD-10-CM | POA: Diagnosis not present

## 2020-12-09 NOTE — Progress Notes (Addendum)
Jocelyn Sanchez, Jocelyn Sanchez ([id number]) Visit Report for 12/09/2020 Arrival Information Details Patient Name: Date of Service: Jocelyn Sanchez [name] 12/09/2020 2:15 PM Medical Record Number: [id number] Patient Account Number: [id number] Date of Birth/Sex: Treating RN: 1955-07-15 (65 y.o. [name] Primary Care [name]: [name] Other Clinician: Referring Jamari Moten: Treating Tzvi Economou/Extender: Drucilla Schmidt Weeks in Treatment: 7 Visit Information History Since Last Visit Added or deleted any medications: Yes Patient Arrived: Wheel Chair Any new allergies or adverse reactions: No Arrival Time: 14:23 Had a fall or experienced change in No Accompanied By: caregiver activities of daily living that may affect Transfer Assistance: Harrel Lemon Lift risk of falls: Patient Identification Verified: Yes Signs or symptoms of abuse/neglect since last visito No Secondary Verification Process Completed: Yes Hospitalized since last visit: No Patient Has Alerts: Yes Implantable device outside of the clinic excluding No Patient Alerts: Patient on Blood Thinner cellular tissue based products placed in the center since last visit: Has Dressing in Place as Prescribed: Yes Pain Present Now: No Electronic Signature(s) Signed: 12/09/2020 5:41:44 PM By: Baruch Gouty RN, BSN Entered By: Baruch Gouty on 12/09/2020 14:24:03 -------------------------------------------------------------------------------- Clinic Level of Care Assessment Details Patient Name: Date of Service: Jocelyn Sanchez [name] 12/09/2020 2:15 PM Medical Record Number: [id number] Patient Account Number: [id number] Date of Birth/Sex: Treating RN: Apr 14, 1956 (65 y.o. [name], [name] Primary Care [name]: [name] Other Clinician: Referring Ananth Fiallos: Treating Tayron Hunnell/Extender: Drucilla Schmidt Weeks in Treatment: 7 Clinic Level of Care Assessment Items TOOL 4 Quantity Score X- 1 0 Use when  only an EandM is performed on FOLLOW-UP visit ASSESSMENTS - Nursing Assessment / Reassessment X- 1 10 Reassessment of Co-morbidities (includes updates in patient status) X- 1 5 Reassessment of Adherence to Treatment Plan ASSESSMENTS - Wound and Skin A ssessment / Reassessment X - Simple Wound Assessment / Reassessment - one wound 1 5 []  - 0 Complex Wound Assessment / Reassessment - multiple wounds X- 1 10 Dermatologic / Skin Assessment (not related to wound area) ASSESSMENTS - Focused Assessment []  - 0 Circumferential Edema Measurements - multi extremities X- 1 10 Nutritional Assessment / Counseling / Intervention []  - 0 Lower Extremity Assessment (monofilament, tuning fork, pulses) []  - 0 Peripheral Arterial Disease Assessment (using hand held doppler) ASSESSMENTS - Ostomy and/or Continence Assessment and Care []  - 0 Incontinence Assessment and Management []  - 0 Ostomy Care Assessment and Management (repouching, etc.) PROCESS - Coordination of Care X - Simple Patient / Family Education for ongoing care 1 15 []  - 0 Complex (extensive) Patient / Family Education for ongoing care X- 1 10 Staff obtains Programmer, systems, Records, T Results / Process Orders est X- 1 10 Staff telephones HHA, Nursing Homes / Clarify orders / etc []  - 0 Routine Transfer to another Facility (non-emergent condition) []  - 0 Routine Hospital Admission (non-emergent condition) []  - 0 New Admissions / Biomedical engineer / Ordering NPWT Apligraf, etc. , []  - 0 Emergency Hospital Admission (emergent condition) X- 1 10 Simple Discharge Coordination []  - 0 Complex (extensive) Discharge Coordination PROCESS - Special Needs []  - 0 Pediatric / Minor Patient Management []  - 0 Isolation Patient Management []  - 0 Hearing / Language / Visual special needs []  - 0 Assessment of Community assistance (transportation, D/C planning, etc.) []  - 0 Additional assistance / Altered mentation []  - 0 Support  Surface(s) Assessment (bed, cushion, seat, etc.) INTERVENTIONS - Wound Cleansing / Measurement X - Simple Wound Cleansing - one wound  1 5 []  - 0 Complex Wound Cleansing - multiple wounds X- 1 5 Wound Imaging (photographs - any number of wounds) []  - 0 Wound Tracing (instead of photographs) X- 1 5 Simple Wound Measurement - one wound []  - 0 Complex Wound Measurement - multiple wounds INTERVENTIONS - Wound Dressings X - Small Wound Dressing one or multiple wounds 1 10 []  - 0 Medium Wound Dressing one or multiple wounds []  - 0 Large Wound Dressing one or multiple wounds []  - 0 Application of Medications - topical []  - 0 Application of Medications - injection INTERVENTIONS - Miscellaneous []  - 0 External ear exam []  - 0 Specimen Collection (cultures, biopsies, blood, body fluids, etc.) []  - 0 Specimen(s) / Culture(s) sent or taken to Lab for analysis X- 1 10 Patient Transfer (multiple staff / Civil Service fast streamer / Similar devices) []  - 0 Simple Staple / Suture removal (25 or less) []  - 0 Complex Staple / Suture removal (26 or more) []  - 0 Hypo / Hyperglycemic Management (close monitor of Blood Glucose) []  - 0 Ankle / Brachial Index (ABI) - do not check if billed separately X- 1 5 Vital Signs Has the patient been seen at the hospital within the last three years: Yes Total Score: 125 Level Of Care: New/Established - Level 4 Electronic Signature(s) Signed: 12/09/2020 5:46:50 PM By: Deon Pilling Entered By: Deon Pilling on 12/09/2020 15:06:34 -------------------------------------------------------------------------------- Encounter Discharge Information Details Patient Name: Date of Service: Jocelyn Sanchez [place] [name] 12/09/2020 2:15 PM Medical Record Number: [id number] Patient Account Number: [id number] Date of Birth/Sex: Treating RN: 1956/03/24 (65 y.o. [name], [name] Primary Care Kivon Aprea: [name] Other Clinician: Referring Dorthia Tout: Treating Lakira Ogando/Extender: Alycia Rossetti in Treatment: 7 Encounter Discharge Information Items Discharge Condition: Stable Ambulatory Status: Wheelchair Discharge Destination: Home Transportation: Private Auto Accompanied By: self Schedule Follow-up Appointment: Yes Clinical Summary of Care: Electronic Signature(s) Signed: 12/09/2020 5:46:50 PM By: Deon Pilling Entered By: Deon Pilling on 12/09/2020 15:07:09 -------------------------------------------------------------------------------- Lower Extremity Assessment Details Patient Name: Date of Service: Jocelyn Sanchez [name] 12/09/2020 2:15 PM Medical Record Number: [id number] Patient Account Number: [id number] Date of Birth/Sex: Treating RN: 12/15/1955 (65 y.o. [name] Primary Care Jasminemarie Sherrard: [name] Other Clinician: Referring Rachella Basden: Treating Ibeth Fahmy/Extender: Drucilla Schmidt Weeks in Treatment: 7 Electronic Signature(s) Signed: 12/09/2020 5:41:44 PM By: Baruch Gouty RN, BSN Entered By: Baruch Gouty on 12/09/2020 14:24:57 -------------------------------------------------------------------------------- Multi Wound Chart Details Patient Name: Date of Service: Jocelyn Sanchez [name] 12/09/2020 2:15 PM Medical Record Number: [id number] Patient Account Number: [id number] Date of Birth/Sex: Treating RN: [birth year] (65 y.o. [name] Primary Care Sae Handrich: [name] Other Clinician: Referring Cierah Crader: Treating Bocephus Cali/Extender: Drucilla Schmidt Weeks in Treatment: 7 Vital Signs Height(in): 63 Pulse(bpm): 105 Weight(lbs): 142 Blood Pressure(mmHg): 130/83 Body Mass Index(BMI): 25 Temperature(F): 97.9 Respiratory Rate(breaths/min): 18 Photos: [3:No Photos Sacrum] [N/A:N/A N/A] Wound Location: [3:Pressure Injury] [N/A:N/A] Wounding Event: [3:Pressure Ulcer] [N/A:N/A] Primary Etiology: [3:Anemia, Deep Vein Thrombosis,] [N/A:N/A] Comorbid History: [3:Hypertension,  Peripheral Venous Disease, Type II Diabetes 07/20/2020] [N/A:N/A] Date Acquired: [3:7] [N/A:N/A] Weeks of Treatment: [3:Open] [N/A:N/A] Wound Status: [3:3x1.6x2.6] [N/A:N/A] Measurements L x W x D (cm) [3:3.77] [N/A:N/A] A (cm) : rea [3:9.802] [N/A:N/A] Volume (cm) : [3:36.00%] [N/A:N/A] % Reduction in A rea: [3:-10.90%] [N/A:N/A] % Reduction in Volume: [3:Category/Stage III] [N/A:N/A] Classification: [3:Medium] [N/A:N/A] Exudate A mount: [3:Serosanguineous] [N/A:N/A] Exudate Type: [3:red, brown] [N/A:N/A] Exudate Color: [3:Well defined, not attached] [N/A:N/A]  Wound Margin: [3:Large (67-100%)] [N/A:N/A] Granulation A mount: [3:Red, Friable] [N/A:N/A] Granulation Quality: [3:None Present (0%)] [N/A:N/A] Necrotic A mount: [3:Fat Layer (Subcutaneous Tissue): Yes N/A] Exposed Structures: [3:Fascia: No Tendon: No Muscle: No Joint: No Bone: No None] [N/A:N/A] Treatment Notes Electronic Signature(s) Signed: 12/09/2020 5:20:02 PM By: Linton Ham MD Signed: 12/09/2020 5:46:50 PM By: Deon Pilling Entered By: Linton Ham on 12/09/2020 15:01:12 -------------------------------------------------------------------------------- Multi-Disciplinary Care Plan Details Patient Name: Date of Service: Jocelyn Sanchez [place] [name] 12/09/2020 2:15 PM Medical Record Number: [id number] Patient Account Number: [id number] Date of Birth/Sex: Treating RN: 01-Jan-1956 (65 y.o. [name], [name] Primary Care Michal Strzelecki: [name] Other Clinician: Referring Christhoper Busbee: Treating Ringo Sherod/Extender: Drucilla Schmidt Weeks in Treatment: 7 Active Inactive Wound/Skin Impairment Nursing Diagnoses: Impaired tissue integrity Knowledge deficit related to ulceration/compromised skin integrity Goals: Patient will have a decrease in wound volume by X% from date: (specify in notes) Date Initiated: 10/19/2020 Target Resolution Date: 01/14/2021 Goal Status: Active Patient/caregiver will verbalize  understanding of skin care regimen Date Initiated: 10/19/2020 Target Resolution Date: 01/14/2021 Goal Status: Active Ulcer/skin breakdown will have a volume reduction of 30% by week 4 Date Initiated: 10/19/2020 Date Inactivated: 12/09/2020 Target Resolution Date: 11/19/2020 Unmet Reason: see wound Goal Status: Unmet measurements. Ulcer/skin breakdown will have a volume reduction of 50% by week 8 Date Initiated: 10/19/2020 Target Resolution Date: 12/16/2020 Goal Status: Active Interventions: Assess patient/caregiver ability to obtain necessary supplies Assess patient/caregiver ability to perform ulcer/skin care regimen upon admission and as needed Assess ulceration(s) every visit Provide education on ulcer and skin care Notes: Electronic Signature(s) Signed: 12/09/2020 5:46:50 PM By: Deon Pilling Entered By: Deon Pilling on 12/09/2020 14:51:49 -------------------------------------------------------------------------------- Pain Assessment Details Patient Name: Date of Service: Jocelyn Sanchez [name] 12/09/2020 2:15 PM Medical Record Number: [id number] Patient Account Number: [id number] Date of Birth/Sex: Treating RN: June 16, 1956 (65 y.o. [name] Primary Care Louis Ivery: [name] Other Clinician: Referring Schylar Allard: Treating Hever Castilleja/Extender: Drucilla Schmidt Weeks in Treatment: 7 Active Problems Location of Pain Severity and Description of Pain Patient Has Paino No Site Locations Rate the pain. Current Pain Level: 0 Pain Management and Medication Current Pain Management: Electronic Signature(s) Signed: 12/09/2020 5:41:44 PM By: Baruch Gouty RN, BSN Entered By: Baruch Gouty on 12/09/2020 14:24:51 -------------------------------------------------------------------------------- Patient/Caregiver Education Details Patient Name: Date of Service: Jocelyn Sanchez [name] 6/23/2022andnbsp2:15 PM Medical Record Number: [id number] Patient Account Number:  [id number] Date of Birth/Gender: Treating RN: [birth year] (65 y.o. [name] Primary Care Physician: [name] Other Clinician: Referring Physician: Treating Physician/Extender: Alycia Rossetti in Treatment: 7 Education Assessment Education Provided To: Caregiver Education Topics Provided Wound/Skin Impairment: Handouts: Skin Care Do's and Dont's Methods: Explain/Verbal Responses: Reinforcements needed Electronic Signature(s) Signed: 12/09/2020 5:46:50 PM By: Deon Pilling Entered By: Deon Pilling on 12/09/2020 14:52:03 -------------------------------------------------------------------------------- Wound Assessment Details Patient Name: Date of Service: Jocelyn Sanchez [name] 12/09/2020 2:15 PM Medical Record Number: [id number] Patient Account Number: [id number] Date of Birth/Sex: Treating RN: Mar 23, 1956 (65 y.o. [name] Primary Care Aniah Pauli: [name] Other Clinician: Referring Hebert Dooling: Treating Amsi Grimley/Extender: Drucilla Schmidt Weeks in Treatment: 7 Wound Status Wound Number: 3 Primary Pressure Ulcer Etiology: Wound Location: Sacrum Wound Open Wounding Event: Pressure Injury Status: Date Acquired: 07/20/2020 Comorbid Anemia, Deep Vein Thrombosis, Hypertension, Peripheral Weeks Of Treatment: 7 History: Venous Disease, Type II Diabetes Clustered Wound: No Photos Wound Measurements Length: (cm) 3 Width: (cm) 1.6 Depth: (cm)  2.6 Area: (cm) 3.77 Volume: (cm) 9.802 % Reduction in Area: 36% % Reduction in Volume: -10.9% Epithelialization: None Tunneling: No Undermining: No Wound Description Classification: Category/Stage III Wound Margin: Well defined, not attached Exudate Amount: Medium Exudate Type: Serosanguineous Exudate Color: red, brown Foul Odor After Cleansing: No Slough/Fibrino No Wound Bed Granulation Amount: Large (67-100%) Exposed Structure Granulation Quality: Red,  Friable Fascia Exposed: No Necrotic Amount: None Present (0%) Fat Layer (Subcutaneous Tissue) Exposed: Yes Tendon Exposed: No Muscle Exposed: No Joint Exposed: No Bone Exposed: No Treatment Notes Wound #3 (Sacrum) Cleanser Soap and Water Discharge Instruction: May shower and wash wound with dial antibacterial soap and water prior to dressing change. Wound Cleanser Discharge Instruction: Cleanse the wound with wound cleanser prior to applying a clean dressing using gauze sponges, not tissue or cotton balls. Peri-Wound Care Skin Prep Discharge Instruction: Use skin prep as directed Topical Primary Dressing Promogran Prisma Matrix, 4.34 (sq in) (silver collagen) Discharge Instruction: Moisten collagen with saline or hydrogel Pack with saline moistened gauze on top of prisma Secondary Dressing Woven Gauze Sponge, Non-Sterile 4x4 in Discharge Instruction: Apply over primary dressing as directed. MPM Excel SAP Bordered Dressing, 7x6.7 (Sacral) (in/in) Discharge Instruction: Apply silicone border over primary dressing as directed. Secured With Compression Wrap Compression Stockings Environmental education officer) Signed: 12/10/2020 12:34:28 PM By: Sandre Kitty Signed: 12/10/2020 6:17:32 PM By: Baruch Gouty RN, BSN Previous Signature: 12/09/2020 5:41:44 PM Version By: Baruch Gouty RN, BSN Previous Signature: 12/09/2020 5:41:44 PM Version By: Baruch Gouty RN, BSN Entered By: Sandre Kitty on 12/10/2020 12:25:27 -------------------------------------------------------------------------------- Vitals Details Patient Name: Date of Service: Jocelyn Sanchez [name] 12/09/2020 2:15 PM Medical Record Number: [id number] Patient Account Number: [id number] Date of Birth/Sex: Treating RN: 01-26-1956 (65 y.o. [name] Primary Care Boyde Grieco: [name] Other Clinician: Referring Travonne Schowalter: Treating Shalondra Wunschel/Extender: Drucilla Schmidt Weeks in Treatment:  7 Vital Signs Time Taken: 14:20 Temperature (F): 97.9 Height (in): 63 Pulse (bpm): 105 Source: Stated Respiratory Rate (breaths/min): 18 Weight (lbs): 142 Blood Pressure (mmHg): 130/83 Source: Stated Reference Range: 80 - 120 mg / dl Body Mass Index (BMI): 25.2 Electronic Signature(s) Signed: 12/09/2020 5:41:44 PM By: Baruch Gouty RN, BSN Entered By: Baruch Gouty on 12/09/2020 14:24:37

## 2020-12-09 NOTE — Progress Notes (Signed)
TANEASHA, FUQUA ([id number]) Visit Report for 12/09/2020 HPI Details Patient Name: Date of Service: Jocelyn Meeker [name] 12/09/2020 2:15 PM Medical Record Number: [id number] Patient Account Number: [id number] Date of Birth/Sex: Treating RN: 28-Jun-1955 (65 y.o. Jocelyn Sanchez, [name] Primary Care Provider: [name] Other Clinician: Referring Provider: Treating Provider/Extender: Drucilla Schmidt Weeks in Treatment: 7 History of Present Illness Location: Midline sacral region Quality: Patient describes this as a soreness Severity: 2 out of 10 Duration: Greater than one year prior to presentation to the wound center Timing: Pain occurs most often with dressing changes Context: Developmental result of pressure over time to the sacral region Modifying Factors: Patient has previously had this wound packed with gauze, she has had a wound VAC, and she tells me other various wound care treatments over the past year. ssociated Signs and Symptoms: Multiple sclerosis, hypertension, diabetes mellitus type 2, lymphedema A HPI Description: 03/27/16 patient presents today on initial evaluation concerning her sacral pressure injury which has been present for roughly one year. She tells me that she does not know of any x-rays or other advanced imaging has been performed in regard to the sacral region. She has been provided with wound care at the [place] facility at this point in time. She has MS and is not able to move she tells me "at all". She does tell me however that the facility is very good about re-positioning her at least every 2 hours. With that being said she did fell the wound VAC apparently. She had this twice the first time she tells me that it did not seem to be working very well and the second time it actually made things worse this was just 2 months ago. Therefore this was discontinued. at this point in time today she is not having any signs or symptoms of systemic  infection she is on a blood thinner which is Eliquis at this point in time. 03/24/16; patient admitted to our clinic by Jeri Cos last week. She is a lady with advanced MS who is had a wound on her lower sacral area for most of the last year. She reiterates to me that wound vacs have not worked for her in fact she states they've actually made her wound worse on the last occasion. An x- ray was apparently done in the facility although I don't have these results. 03/31/16; this patient had an x-ray done at the facility that did not show evidence of osteomyelitis. Her bone biopsy did not show osteomyelitis. 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 She had been suggested to have collagen on the base of the wound covered with normal saline wet to dry although her intake nurse reports there was no collagen on the wound surface. 05/15/16 wound is not changed that much. This is a substantial stage IV wound with considerable undermining from 7 to 3:00. I have spoken to the wound care nurse at the [facility] facility, there was no records on what exactly caused the wound VAC to fail or records of the same. 06/05/16; I received communication from [facility] skilled facility reporting that the patient refuses the wound back more often that it's on. When I confronted her with this today in the room she denied that she was responsible stating for the most part the  problem is because she has trouble getting them to deal with her Foley catheter properly. I don't exactly follow her line of reasoning here however apparently they've been putting hydrogel wet to dry and in the facility 07/03/16; I have not heard anything from [place]. The patient states she has not had a wound VAC the last 2-3 days due to "battery problems".  She arrives today with wet to dry dressing. She has advanced MS and is a chronic care resident at the skilled facility 07/31/16; patient is a resident of [facility] skilled facility. She has had the VAC applied but did not arrive with the [facility] in place. Her wound generally looks better perhaps somewhat less tunneling superiorly. There is still some exposed bone however. 09/11/16; I follow this patient with advanced MS who is a resident of [facility] skilled facility on a monthly basis. She arrives 6 weeks after her last visit. The wound VAC apparently is no longer being used according to the patient because the nurses in the facility could not maintain a seal in this area. Via the patient it seems that they're using some form of wet to dry dressing although he received no documentation. The patient states she is eating well she is being turned in the facility. 10/09/16; I follow this patient with advanced MS who is a resident of [facility]. She apparently could not have a wound VAC is the facility could not maintain a seal therefore they're using wet-to-dry dressings however the patient arrives today with better measurements and a nicely granulated wound bed. 11/05/16; patient with advanced MS was a pressure area on the lower sacrum/coccyx. She could not have a wound VAC because the facility could n/ot maintain a seal. I ordered Prisma last time. Wound dimensions are about the same per intake 12/04/16; pressure area on the lower sacrum. using collagen. Has a new concern on the right great toe 01/01/17; patient has now only a small open area on the lower sacrumcoccyx she is been doing well and this wound is gradually progressing towards closure. The area on the right great toe that looked to me like a paronychia last time is also quite a bit better although the nail here looks as though it's loosening READMISSION 10/19/2020 This is a patient with advanced multiple sclerosis. We had her in clinic for a  prolonged period in late 2017 to the summer 2018 with a stage IV wound on her lower sacrum/coccyx. I do not think we healed her out although her wound was a lot better. She was a resident at [facility] skilled facility at the time The patient states the wound reopened about 3 or 4 months ago. I am not completely certain from talking to the patient if this is in the same position as last time or even indeed if it ever closed. 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 She has a home health aide who comes twice a day. She had an air mattress but apparently it does not work As far as I can tell there is not been any major change in her medical problem list except she lives at home now. She is a diabetic on metformin 6/23; patient has not been here in about 6-7 weeks. I am not sure what the issue is and the patient does not seem to really know. She takes scat for transportation although that has not  been a problem before. We changed her dressing last time she was here to silver collagen moistened with the backing wet- to-dry and a foam border. Her wound is quite a bit better looking. Electronic Signature(s) Signed: 12/09/2020 5:20:02 PM By: Linton Ham MD Entered By: Linton Ham on 12/09/2020 15:02:26 -------------------------------------------------------------------------------- Physical Exam Details Patient Name: Date of Service: Jocelyn Meeker [name] 12/09/2020 2:15 PM Medical Record Number: [id number] Patient Account Number: [id number] Date of Birth/Sex: Treating RN: [birth year] (65 y.o. Jocelyn Sanchez Primary Care Provider: [name] Other Clinician: Referring Provider: Treating Provider/Extender: Drucilla Schmidt Weeks in Treatment: 7 Constitutional Sitting or standing Blood Pressure is within target range for patient.. Pulse regular and within target  range for patient.Marland Kitchen Respirations regular, non-labored and within target range.. Temperature is normal and within the target range for the patient.Marland Kitchen Appears in no distress. Psychiatric Patient is alert and orientated. Notes Wound exam; over the lower sacrum. The wound is actually better. There is no palpable bone at 3:00. There is not a lot of granulation over bone but there is no exposed bone this is time this is quite an improvement. No evidence of surrounding infection there is no purulence no cultures were done Electronic Signature(s) Signed: 12/09/2020 5:20:02 PM By: Linton Ham MD Entered By: Linton Ham on 12/09/2020 15:08:30 -------------------------------------------------------------------------------- Physician Orders Details Patient Name: Date of Service: Jocelyn Meeker [name] 12/09/2020 2:15 PM Medical Record Number: [id number] Patient Account Number: [id number] Date of Birth/Sex: Treating RN: 04/05/1956 (65 y.o. Jocelyn Sanchez Primary Care Provider: [name] Other Clinician: Referring Provider: Treating Provider/Extender: Drucilla Schmidt Weeks in Treatment: 7 Verbal / Phone Orders: No Diagnosis Coding ICD-10 Coding Code Description L89.154 Pressure ulcer of sacral region, stage 4 G35 Multiple sclerosis Follow-up Appointments Return appointment in 3 weeks. - Dr. Dellia Nims Bathing/ Shower/ Hygiene May shower with protection but do not get wound dressing(s) wet. - May shower and use soap and water on days dressings are changed Off-Loading Low air-loss mattress (Group 2) - Continue to use. Turn and reposition every 2 hours Home Health No change in wound care orders this week; continue Home Health for wound care. May utilize formulary equivalent dressing for wound treatment orders unless otherwise specified. - [place] to change Monday's, Wednesday's, and Friday's!! Wound Treatment Wound #3 - Sacrum Cleanser: Soap and Water [facility]) Every Other Day/30 Days Discharge Instructions: May shower and wash wound with dial antibacterial soap and water prior to dressing change. Cleanser: Wound Cleanser [facility]) Every Other Day/30 Days Discharge Instructions: Cleanse the wound with wound cleanser prior to applying a clean dressing using gauze sponges, not tissue or cotton balls. Peri-Wound Care: Skin Prep [facility]) Every Other Day/30 Days Discharge Instructions: Use skin prep as directed Prim Dressing: Promogran Prisma Matrix, 4.34 (sq in) (silver collagen) (Home Health) Every Other Day/30 Days ary Discharge Instructions: Moisten collagen with saline or hydrogel Prim Dressing: Pack with saline moistened gauze on top of prisma [facility]) Every Other Day/30 Days ary Secondary Dressing: Woven Gauze Sponge, Non-Sterile 4x4 in [facility]) Every Other Day/30 Days Discharge Instructions: Apply over primary dressing as directed. Secondary Dressing: MPM Excel SAP Bordered Dressing, 7x6.7 (Sacral) (in/in) (Home Health) Every Other Day/30 Days Discharge Instructions: Apply silicone border over primary dressing as directed. Electronic Signature(s) Signed: 12/09/2020 5:20:02 PM By: Linton Ham MD Signed: 12/09/2020 5:46:50 PM By: Deon Pilling Entered By: Deon Pilling on 12/09/2020 15:05:46 -------------------------------------------------------------------------------- Problem  List Details Patient Name: Date of Service: Jocelyn Meeker [name] 12/09/2020 2:15 PM Medical Record Number: [id number] Patient Account Number: [id number] Date of Birth/Sex: Treating RN: March 31, 1956 (65 y.o. Jocelyn Sanchez, [name] Primary Care Provider: [name] Other Clinician: Referring Provider: Treating Provider/Extender: Drucilla Schmidt Weeks in Treatment: 7 Active Problems ICD-10 Encounter Code Description Active Date MDM Diagnosis L89.154 Pressure ulcer of sacral region, stage 4 10/19/2020 No Yes G35 Multiple  sclerosis 10/19/2020 No Yes Inactive Problems Resolved Problems Electronic Signature(s) Signed: 12/09/2020 5:20:02 PM By: Linton Ham MD Entered By: Linton Ham on 12/09/2020 15:01:05 -------------------------------------------------------------------------------- Progress Note Details Patient Name: Date of Service: Jocelyn Meeker [name] 12/09/2020 2:15 PM Medical Record Number: [id number] Patient Account Number: [id number] Date of Birth/Sex: Treating RN: 1956/05/09 (65 y.o. Jocelyn Sanchez, [name] Primary Care Provider: [name] Other Clinician: Referring Provider: Treating Provider/Extender: Drucilla Schmidt Weeks in Treatment: 7 Subjective History of Present Illness (HPI) The following HPI elements were documented for the patient's wound: Location: Midline sacral region Quality: Patient describes this as a soreness Severity: 2 out of 10 Duration: Greater than one year prior to presentation to the wound center Timing: Pain occurs most often with dressing changes Context: Developmental result of pressure over time to the sacral region Modifying Factors: Patient has previously had this wound packed with gauze, she has had a wound VAC, and she tells me other various wound care treatments over the past year. Associated Signs and Symptoms: Multiple sclerosis, hypertension, diabetes mellitus type 2, lymphedema 03/27/16 patient presents today on initial evaluation concerning her sacral pressure injury which has been present for roughly one year. She tells me that she does not know of any x-rays or other advanced imaging has been performed in regard to the sacral region. She has been provided with wound care at the [place] facility at this point in time. She has MS and is not able to move she tells me "at all". She does tell me however that the facility is very good about re-positioning her at least every 2 hours. With that being said she did fell the wound VAC  apparently. She had this twice the first time she tells me that it did not seem to be working very well and the second time it actually made things worse this was just 2 months ago. Therefore this was discontinued. at this point in time today she is not having any signs or symptoms of systemic infection she is on a blood thinner which is Eliquis at this point in time. 03/24/16; patient admitted to our clinic by Jeri Cos last week. She is a lady with advanced MS who is had a wound on her lower sacral area for most of the last year. She reiterates to me that wound vacs have not worked for her in fact she states they've actually made her wound worse on the last occasion. An x- ray was apparently done in the facility although I don't have these results. 03/31/16; this patient had an x-ray done at the facility that did not show evidence of osteomyelitis. Her bone biopsy did not show osteomyelitis. 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 She had been suggested to have collagen on the base of the wound covered with normal saline wet to  dry although her intake nurse reports there was no collagen on the wound surface. 05/15/16 wound is not changed that much. This is a substantial stage IV wound with considerable undermining from 7 to 3:00. I have spoken to the wound care nurse at the [facility] facility, there was no records on what exactly caused the wound VAC to fail or records of the same. 06/05/16; I received communication from [facility] skilled facility reporting that the patient refuses the wound back more often that it's on. When I confronted her with this today in the room she denied that she was responsible stating for the most part the problem is because she has trouble getting them to deal with her Foley  catheter properly. I don't exactly follow her line of reasoning here however apparently they've been putting hydrogel wet to dry and in the facility 07/03/16; I have not heard anything from [place]. The patient states she has not had a wound VAC the last 2-3 days due to "battery problems". She arrives today with wet to dry dressing. She has advanced MS and is a chronic care resident at the skilled facility 07/31/16; patient is a resident of [facility] skilled facility. She has had the VAC applied but did not arrive with the [facility] in place. Her wound generally looks better perhaps somewhat less tunneling superiorly. There is still some exposed bone however. 09/11/16; I follow this patient with advanced MS who is a resident of [facility] skilled facility on a monthly basis. She arrives 6 weeks after her last visit. The wound VAC apparently is no longer being used according to the patient because the nurses in the facility could not maintain a seal in this area. Via the patient it seems that they're using some form of wet to dry dressing although he received no documentation. The patient states she is eating well she is being turned in the facility. 10/09/16; I follow this patient with advanced MS who is a resident of [facility]. She apparently could not have a wound VAC is the facility could not maintain a seal therefore they're using wet-to-dry dressings however the patient arrives today with better measurements and a nicely granulated wound bed. 11/05/16; patient with advanced MS was a pressure area on the lower sacrum/coccyx. She could not have a wound VAC because the facility could n/ot maintain a seal. I ordered Prisma last time. Wound dimensions are about the same per intake 12/04/16; pressure area on the lower sacrum. using collagen. Has a new concern on the right great toe 01/01/17; patient has now only a small open area on the lower sacrumoococcyx she is been doing well and this wound is gradually  progressing towards closure. The area on the right great toe that looked to me like a paronychia last time is also quite a bit better although the nail here looks as though it's loosening READMISSION 10/19/2020 This is a patient with advanced multiple sclerosis. We had her in clinic for a prolonged period in late 2017 to the summer 2018 with a stage IV wound on her lower sacrum/coccyx. I do not think we healed her out although her wound was a lot better. She was a resident at [facility] skilled facility at the time The patient states the wound reopened about 3 or 4 months ago. I am not completely certain from talking to the patient if this is in the same position as last time or even indeed if it ever closed. This is a stage IV wound with exposed bone at  3:00 I think they are using silver alginate base dressing she has home health 1 time a week using Aquacel. She has a home health aide who comes twice a day. She had an air mattress but apparently it does not work As far as I can tell there is not been any major change in her medical problem list except she lives at home now. She is a diabetic on metformin 6/23; patient has not been here in about 6-7 weeks. I am not sure what the issue is and the patient does not seem to really know. She takes scat for transportation although that has not been a problem before. We changed her dressing last time she was here to silver collagen moistened with the backing wet- to-dry and a foam border. Her wound is quite a bit better looking. Objective Constitutional Sitting or standing Blood Pressure is within target range for patient.. Pulse regular and within target range for patient.Marland Kitchen Respirations regular, non-labored and within target range.. Temperature is normal and within the target range for the patient.Marland Kitchen Appears in no distress. Vitals Time Taken: 2:20 PM, Height: 63 in, Source: Stated, Weight: 142 lbs, Source: Stated, BMI: 25.2, Temperature: 97.9 F, Pulse: 105  bpm, Respiratory Rate: 18 breaths/min, Blood Pressure: 130/83 mmHg. Psychiatric Patient is alert and orientated. General Notes: Wound exam; over the lower sacrum. The wound is actually better. There is no palpable bone at 3:00. There is not a lot of granulation over bone but there is no exposed bone this is time this is quite an improvement. No evidence of surrounding infection there is no purulence no cultures were done Integumentary (Hair, Skin) Wound #3 status is Open. Original cause of wound was Pressure Injury. The date acquired was: 07/20/2020. The wound has been in treatment 7 weeks. The wound is located on the Sacrum. The wound measures 3cm length x 1.6cm width x 2.6cm depth; 3.77cm^2 area and 9.802cm^3 volume. There is Fat Layer (Subcutaneous Tissue) exposed. There is no tunneling or undermining noted. There is a medium amount of serosanguineous drainage noted. The wound margin is well defined and not attached to the wound base. There is large (67-100%) red, friable granulation within the wound bed. There is no necrotic tissue within the wound bed. Assessment Active Problems ICD-10 Pressure ulcer of sacral region, stage 4 Multiple sclerosis Plan Follow-up Appointments: Return appointment in 3 weeks. - Dr. Dellia Nims Bathing/ Shower/ Hygiene: May shower with protection but do not get wound dressing(s) wet. - May shower and use soap and water on days dressings are changed Off-Loading: Low air-loss mattress (Group 2) - Continue to use. Turn and reposition every 2 hours Home Health: No change in wound care orders this week; continue Home Health for wound care. May utilize formulary equivalent dressing for wound treatment orders unless otherwise specified. - [place] to change Monday's, Wednesday's, and Friday's!! WOUND #3: - Sacrum Wound Laterality: Cleanser: Soap and Water [facility]) Every Other Day/30 Days Discharge Instructions: May shower and wash wound with dial  antibacterial soap and water prior to dressing change. Cleanser: Wound Cleanser [facility]) Every Other Day/30 Days Discharge Instructions: Cleanse the wound with wound cleanser prior to applying a clean dressing using gauze sponges, not tissue or cotton balls. Peri-Wound Care: Skin Prep [facility]) Every Other Day/30 Days Discharge Instructions: Use skin prep as directed Prim Dressing: Promogran Prisma Matrix, 4.34 (sq in) (silver collagen) (Home Health) Every Other Day/30 Days ary Discharge Instructions: Moisten collagen with saline or hydrogel Prim Dressing:  Pack with saline moistened gauze on top of prisma [facility]) Every Other Day/30 Days ary Secondary Dressing: Woven Gauze Sponge, Non-Sterile 4x4 in [facility]) Every Other Day/30 Days Discharge Instructions: Apply over primary dressing as directed. Secondary Dressing: MPM Excel SAP Bordered Dressing, 7x6.7 (Sacral) (in/in) (Home Health) Every Other Day/30 Days Discharge Instructions: Apply silicone border over primary dressing as directed. 1. I continued with the silver collagen with backing wet-to-dry border foam. I think this is being changed by home health 2. She has a level 3 surface this will certainly help her 3. I thought about a wound VAC last time but truthfully there is been so much improvement this time we will see how she continues to respond to a basic dressing. 4. Follow-up in 3 weeks Electronic Signature(s) Signed: 12/09/2020 5:20:02 PM By: Linton Ham MD Entered By: Linton Ham on 12/09/2020 15:09:42 -------------------------------------------------------------------------------- SuperBill Details Patient Name: Date of Service: Jocelyn Meeker [name] 12/09/2020 Medical Record Number: [id number] Patient Account Number: [id number] Date of Birth/Sex: Treating RN: 11/30/1955 (65 y.o. Jocelyn Sanchez Primary Care Provider: [name] Other Clinician: Referring Provider: Treating Provider/Extender:  Drucilla Schmidt Weeks in Treatment: 7 Diagnosis Coding ICD-10 Codes Code Description L89.154 Pressure ulcer of sacral region, stage 4 G35 Multiple sclerosis Facility Procedures CPT4 Code: [id number] Description: 99214 - WOUND CARE VISIT-LEV 4 EST PT Modifier: Quantity: 1 Physician Procedures : CPT4 Code Description Modifier [id number] - WC PHYS LEVEL 3 - EST PT ICD-10 Diagnosis Description L89.154 Pressure ulcer of sacral region, stage 4 G35 Multiple sclerosis Quantity: 1 Electronic Signature(s) Signed: 12/09/2020 5:20:02 PM By: Linton Ham MD Entered By: Linton Ham on 12/09/2020 15:10:05

## 2020-12-30 ENCOUNTER — Encounter (HOSPITAL_BASED_OUTPATIENT_CLINIC_OR_DEPARTMENT_OTHER): Payer: Medicare Other | Attending: Internal Medicine | Admitting: Internal Medicine

## 2020-12-30 ENCOUNTER — Other Ambulatory Visit: Payer: Self-pay

## 2020-12-30 DIAGNOSIS — Z7984 Long term (current) use of oral hypoglycemic drugs: Secondary | ICD-10-CM | POA: Diagnosis not present

## 2020-12-30 DIAGNOSIS — E1151 Type 2 diabetes mellitus with diabetic peripheral angiopathy without gangrene: Secondary | ICD-10-CM | POA: Insufficient documentation

## 2020-12-30 DIAGNOSIS — L89154 Pressure ulcer of sacral region, stage 4: Secondary | ICD-10-CM | POA: Diagnosis not present

## 2020-12-30 DIAGNOSIS — E11622 Type 2 diabetes mellitus with other skin ulcer: Secondary | ICD-10-CM | POA: Diagnosis present

## 2020-12-30 DIAGNOSIS — I890 Lymphedema, not elsewhere classified: Secondary | ICD-10-CM | POA: Diagnosis not present

## 2020-12-30 DIAGNOSIS — G35 Multiple sclerosis: Secondary | ICD-10-CM | POA: Diagnosis not present

## 2020-12-30 NOTE — Progress Notes (Signed)
STEFFANI, DIONISIO ([id number]) Visit Report for 12/30/2020 Arrival Information Details Patient Name: Date of Service: Jocelyn Sanchez Jocelyn D. 12/30/2020 2:15 PM Medical Record Number: [id number] Patient Account Number: [id number] Date of Birth/Sex: Treating RN: 05/04/1956 (65 y.o. Jocelyn Sanchez Primary Care Jocelyn Sanchez: Jocelyn Sanchez Other Clinician: Referring Jocelyn Sanchez: Treating Jocelyn Sanchez Visit Information History Since Last Visit Added or deleted any medications: No Patient Arrived: Ambulatory Any new allergies or adverse reactions: No Arrival Time: 14:45 Had a fall or experienced change in No Accompanied By: alone activities of daily living that may affect Transfer Assistance: None risk of falls: Patient Identification Verified: Yes Signs or symptoms of abuse/neglect since last visito No Secondary Verification Process Completed: Yes Hospitalized since last visit: No Patient Has Alerts: Yes Implantable device outside of the clinic excluding No Patient Alerts: Patient on Blood Thinner cellular tissue based products placed in the center since last visit: Has Dressing in Place as Prescribed: Yes Pain Present Now: No Electronic Signature(s) Signed: 12/30/2020 6:01:Sanchez PM By: Jocelyn Sanchez Entered By: Jocelyn Sanchez 14:45:52 -------------------------------------------------------------------------------- Clinic Level of Care Assessment Details Patient Name: Date of Service: Jocelyn Sanchez Jocelyn D. 12/30/2020 2:15 PM Medical Record Number: [id number] Patient Account Number: [id number] Date of Birth/Sex: Treating RN: 08/06/1955 (65 y.o. Jocelyn Sanchez Primary Care Tachina Spoonemore: Jocelyn Sanchez Other Clinician: Referring Kaven Cumbie: Treating Rolinda Impson/Extender: Jocelyn Sanchez in Treatment: Sanchez Clinic Level of Care Assessment Items TOOL 4 Quantity Score X- 1 0 Use when only an EandM  is performed on FOLLOW-UP visit ASSESSMENTS - Nursing Assessment / Reassessment X- 1 Sanchez Reassessment of Co-morbidities (includes updates in patient status) X- 1 5 Reassessment of Adherence to Treatment Plan ASSESSMENTS - Wound and Skin A ssessment / Reassessment []  - 0 Simple Wound Assessment / Reassessment - one wound X- 2 5 Complex Wound Assessment / Reassessment - multiple wounds X- 1 Sanchez Dermatologic / Skin Assessment (not related to wound area) ASSESSMENTS - Focused Assessment []  - 0 Circumferential Edema Measurements - multi extremities X- 1 Sanchez Nutritional Assessment / Counseling / Intervention []  - 0 Lower Extremity Assessment (monofilament, tuning fork, pulses) []  - 0 Peripheral Arterial Disease Assessment (using hand held doppler) ASSESSMENTS - Ostomy and/or Continence Assessment and Care []  - 0 Incontinence Assessment and Management []  - 0 Ostomy Care Assessment and Management (repouching, etc.) PROCESS - Coordination of Care []  - 0 Simple Patient / Family Education for ongoing care X- 1 20 Complex (extensive) Patient / Family Education for ongoing care X- 1 Sanchez Staff obtains Programmer, systems, Records, T Results / Process Orders est X- 1 Sanchez Staff telephones HHA, Nursing Homes / Clarify orders / etc []  - 0 Routine Transfer to another Facility (non-emergent condition) []  - 0 Routine Hospital Admission (non-emergent condition) []  - 0 New Admissions / Biomedical engineer / Ordering NPWT Apligraf, etc. , []  - 0 Emergency Hospital Admission (emergent condition) []  - 0 Simple Discharge Coordination X- 1 15 Complex (extensive) Discharge Coordination PROCESS - Special Needs []  - 0 Pediatric / Minor Patient Management []  - 0 Isolation Patient Management []  - 0 Hearing / Language / Visual special needs []  - 0 Assessment of Community assistance (transportation, D/C planning, etc.) []  - 0 Additional assistance / Altered mentation []  - 0 Support Surface(s)  Assessment (bed, cushion, seat, etc.) INTERVENTIONS - Wound Cleansing / Measurement []  - 0 Simple Wound Cleansing - one wound X- 2 5  Complex Wound Cleansing - multiple wounds X- 1 5 Wound Imaging (photographs - any number of wounds) []  - 0 Wound Tracing (instead of photographs) []  - 0 Simple Wound Measurement - one wound X- 2 5 Complex Wound Measurement - multiple wounds INTERVENTIONS - Wound Dressings []  - 0 Small Wound Dressing one or multiple wounds X- 1 15 Medium Wound Dressing one or multiple wounds []  - 0 Large Wound Dressing one or multiple wounds []  - 0 Application of Medications - topical []  - 0 Application of Medications - injection INTERVENTIONS - Miscellaneous []  - 0 External ear exam []  - 0 Specimen Collection (cultures, biopsies, blood, body fluids, etc.) []  - 0 Specimen(s) / Culture(s) sent or taken to Lab for analysis []  - 0 Patient Transfer (multiple staff / Civil Service fast streamer / Similar devices) []  - 0 Simple Staple / Suture removal (25 or less) []  - 0 Complex Staple / Suture removal (26 or more) []  - 0 Hypo / Hyperglycemic Management (close monitor of Blood Glucose) []  - 0 Ankle / Brachial Index (ABI) - do not check if billed separately X- 1 5 Vital Signs Has the patient been seen at the hospital within the last three years: Yes Total Score: 145 Level Of Care: New/Established - Level 4 Electronic Signature(s) Signed: 12/30/2020 6:14:19 PM By: Deon Pilling Entered By: Deon Pilling on Sanchez 15:00:31 -------------------------------------------------------------------------------- Encounter Discharge Information Details Patient Name: Date of Service: Jocelyn Sanchez, Jocelyn Sanchez Jocelyn D. 12/30/2020 2:15 PM Medical Record Number: [id number] Patient Account Number: [id number] Date of Birth/Sex: Treating RN: [birth year] (65 y.o. Jocelyn Sanchez Primary Care Amoni Scallan: Jocelyn Sanchez Other Clinician: Referring Jocelyn Sanchez: Treating Jocelyn Sanchez/Extender: Jocelyn Sanchez in Treatment: Sanchez Encounter Discharge Information Items Discharge Condition: Stable Ambulatory Status: Wheelchair Discharge Destination: Home Transportation: Private Auto Accompanied By: self Schedule Follow-up Appointment: Yes Clinical Summary of Care: Electronic Signature(s) Signed: 12/30/2020 6:14:19 PM By: Deon Pilling Entered By: Deon Pilling on Sanchez 18:05:42 -------------------------------------------------------------------------------- Multi Wound Chart Details Patient Name: Date of Service: Jocelyn Sanchez Sanchez Jocelyn D. 12/30/2020 2:15 PM Medical Record Number: [id number] Patient Account Number: [id number] Date of Birth/Sex: Treating RN: [birth year] (65 y.o. Jocelyn Sanchez Primary Care Kynzee Devinney: Jocelyn Sanchez Other Clinician: Referring Clint Biello: Treating Raelin Pixler/Extender: Jocelyn Sanchez in Treatment: Sanchez Vital Signs Height(in): 63 Pulse(bpm): 88 Weight(lbs): 142 Blood Pressure(mmHg): 101/71 Body Mass Index(BMI): 25 Temperature(F): 98.0 Respiratory Rate(breaths/min): 16 Photos: [3:No Photos Sacrum] [4:No Photos Right Gluteus] [N/A:N/A N/A] Wound Location: [3:Pressure Injury] [4:Pressure Injury] [N/A:N/A] Wounding Event: [3:Pressure Ulcer] [4:Pressure Ulcer] [N/A:N/A] Primary Etiology: [3:Anemia, Deep Vein Thrombosis,] [4:Anemia, Deep Vein Thrombosis,] [N/A:N/A] Comorbid History: [3:Hypertension, Peripheral Venous Disease, Type II Diabetes 07/20/2020] [4:Hypertension, Peripheral Venous Disease, Type II Diabetes 12/30/2020] [N/A:N/A] Date Acquired: [3:Sanchez] [4:0] [N/A:N/A] Sanchez of Treatment: [3:Open] [4:Open] [N/A:N/A] Wound Status: [3:4.7x3.4x1.9] [4:5.3x2x0.1] [N/A:N/A] Measurements L x W x D (cm) [3:12.551] [4:8.325] [N/A:N/A] A (cm) : rea [3:23.846] [4:0.833] [N/A:N/A] Volume (cm) : [3:-113.Sanchez%] [4:N/A] [N/A:N/A] % Reduction in A rea: [3:-169.90%] [4:N/A] [N/A:N/A] % Reduction in Volume: [3:12] Starting  Position 1 (o'clock): [3:3] Ending Position 1 (o'clock): [3:3] Maximum Distance 1 (cm): [3:Yes] [4:No] [N/A:N/A] Undermining: [3:Category/Stage III] [4:Category/Stage III] [N/A:N/A] Classification: [3:Medium] [4:Medium] [N/A:N/A] Exudate A mount: [3:Serosanguineous] [4:Serosanguineous] [N/A:N/A] Exudate Type: [3:red, brown] [4:red, brown] [N/A:N/A] Exudate Color: [3:Well defined, not attached] [4:Flat and Intact] [N/A:N/A] Wound Margin: [3:Small (1-33%)] [4:Small (1-33%)] [N/A:N/A] Granulation A mount: [3:Pink] [4:Pink] [N/A:N/A] Granulation Quality: [3:Large (67-100%)] [4:Large (67-100%)] [N/A:N/A] Necrotic A mount: [3:Fat Layer (Subcutaneous Tissue): Yes  Fat Layer (Subcutaneous Tissue): Yes N/A] Exposed Structures: [3:Fascia: No Tendon: No Muscle: No Joint: No Bone: No None] [4:Fascia: No Tendon: No Muscle: No Joint: No Bone: No None] [N/A:N/A] Treatment Notes Electronic Signature(s) Signed: 12/30/2020 4:30:53 PM By: Linton Ham MD Signed: 12/30/2020 6:14:19 PM By: Deon Pilling Entered By: Linton Ham on Sanchez 15:03:39 -------------------------------------------------------------------------------- Multi-Disciplinary Care Plan Details Patient Name: Date of Service: Jocelyn Sanchez, Jocelyn Sanchez Jocelyn D. 12/30/2020 2:15 PM Medical Record Number: [id number] Patient Account Number: [id number] Date of Birth/Sex: Treating RN: 1956/06/01 (65 y.o. Jocelyn Sanchez Primary Care Mariana Wiederholt: Jocelyn Sanchez Other Clinician: Referring Lucus Lambertson: Treating Arhan Mcmanamon/Extender: Jocelyn Sanchez in Treatment: Sanchez Active Inactive Wound/Skin Impairment Nursing Diagnoses: Impaired tissue integrity Knowledge deficit related to ulceration/compromised skin integrity Goals: Patient will have a decrease in wound volume by X% from date: (specify in notes) Date Initiated: 10/19/2020 Target Resolution Date: 01/14/2021 Goal Status: Active Patient/caregiver will verbalize understanding of skin  care regimen Date Initiated: 10/19/2020 Target Resolution Date: 01/14/2021 Goal Status: Active Ulcer/skin breakdown will have a volume reduction of 30% by week 4 Date Initiated: 10/19/2020 Date Inactivated: 12/09/2020 Target Resolution Date: 11/19/2020 Unmet Reason: see wound Goal Status: Unmet measurements. Ulcer/skin breakdown will have a volume reduction of 50% by week 8 Date Initiated: 10/19/2020 Date Inactivated: 12/30/2020 Target Resolution Date: 12/16/2020 Unmet Reason: see wound Goal Status: Unmet measurements. Interventions: Assess patient/caregiver ability to obtain necessary supplies Assess patient/caregiver ability to perform ulcer/skin care regimen upon admission and as needed Assess ulceration(s) every visit Provide education on ulcer and skin care Notes: Electronic Signature(s) Signed: 12/30/2020 6:14:19 PM By: Deon Pilling Entered By: Deon Pilling on Sanchez 14:56:21 -------------------------------------------------------------------------------- Pain Assessment Details Patient Name: Date of Service: Jocelyn Sanchez Jocelyn D. 12/30/2020 2:15 PM Medical Record Number: [id number] Patient Account Number: [id number] Date of Birth/Sex: Treating RN: [birth year] (65 y.o. Jocelyn Sanchez Primary Care Jasiel Belisle: Jocelyn Sanchez Other Clinician: Referring Bryna Razavi: Treating Zeric Baranowski/Extender: Jocelyn Sanchez in Treatment: Sanchez Active Problems Location of Pain Severity and Description of Pain Patient Has Paino No Site Locations Pain Management and Medication Current Pain Management: Electronic Signature(s) Signed: 12/30/2020 6:01:Sanchez PM By: Jocelyn Sanchez Entered By: Jocelyn Sanchez 14:46:19 -------------------------------------------------------------------------------- Patient/Caregiver Education Details Patient Name: Date of Service: Jocelyn Sanchez Jocelyn D. 7/14/2022andnbsp2:15 PM Medical Record Number: [id number] Patient Account  Number: [id number] Date of Birth/Gender: Treating RN: [birth year] (65 y.o. [name] Primary Care Physician: Jocelyn Sanchez Other Clinician: Referring Physician: Treating Physician/Extender: Jocelyn Sanchez in Treatment: Sanchez Education Assessment Education Provided To: Patient Education Topics Provided Wound/Skin Impairment: Handouts: Skin Care Do's and Dont's Methods: Explain/Verbal Responses: Reinforcements needed Electronic Signature(s) Signed: 12/30/2020 6:14:19 PM By: Deon Pilling Entered By: Deon Pilling on Sanchez 14:57:02 -------------------------------------------------------------------------------- Wound Assessment Details Patient Name: Date of Service: Jocelyn Sanchez Jocelyn D. 12/30/2020 2:15 PM Medical Record Number: [id number] Patient Account Number: [id number] Date of Birth/Sex: Treating RN: 1955/11/03 (65 y.o. Jocelyn Sanchez Primary Care Sydell Prowell: Jocelyn Sanchez Other Clinician: Referring Aleicia Kenagy: Treating Amara Justen/Extender: Jocelyn Sanchez in Treatment: Sanchez Wound Status Wound Number: 3 Primary Pressure Ulcer Etiology: Wound Location: Sacrum Wound Open Wounding Event: Pressure Injury Status: Date Acquired: 07/20/2020 Comorbid Anemia, Deep Vein Thrombosis, Hypertension, Peripheral Sanchez Of Treatment: Sanchez History: Venous Disease, Type II Diabetes Clustered Wound: No Wound Measurements Length: (cm) 4.7 Width: (cm) 3.4 Depth: (cm) 1.9 Area: (cm) 12.551 Volume: (cm) 23.846 % Reduction  in Area: -113.1% % Reduction in Volume: -169.9% Epithelialization: None Tunneling: No Undermining: Yes Starting Position (o'clock): 12 Ending Position (o'clock): 3 Maximum Distance: (cm) 3 Wound Description Classification: Category/Stage III Wound Margin: Well defined, not attached Exudate Amount: Medium Exudate Type: Serosanguineous Exudate Color: red, brown Foul Odor After Cleansing: No Slough/Fibrino  Yes Wound Bed Granulation Amount: Small (1-33%) Exposed Structure Granulation Quality: Pink Fascia Exposed: No Necrotic Amount: Large (67-100%) Fat Layer (Subcutaneous Tissue) Exposed: Yes Necrotic Quality: Adherent Slough Tendon Exposed: No Muscle Exposed: No Joint Exposed: No Bone Exposed: No Treatment Notes Wound #3 (Sacrum) Cleanser Soap and Water Discharge Instruction: May shower and wash wound with dial antibacterial soap and water prior to dressing change. Wound Cleanser Discharge Instruction: Cleanse the wound with wound cleanser prior to applying a clean dressing using gauze sponges, not tissue or cotton balls. Peri-Wound Care Skin Prep Discharge Instruction: Use skin prep as directed Topical Primary Dressing Promogran Prisma Matrix, 4.34 (sq in) (silver collagen) Discharge Instruction: Moisten collagen with saline or hydrogel Pack with saline moistened gauze on top of prisma Secondary Dressing Woven Gauze Sponge, Non-Sterile 4x4 in Discharge Instruction: Apply over primary dressing as directed. MPM Excel SAP Bordered Dressing, 7x6.7 (Sacral) (in/in) Discharge Instruction: Apply silicone border over primary dressing as directed. Secured With Compression Wrap Compression Stockings Environmental education officer) Signed: 12/30/2020 6:01:Sanchez PM By: Jocelyn Sanchez Entered By: Jocelyn Sanchez 14:46:59 -------------------------------------------------------------------------------- Wound Assessment Details Patient Name: Date of Service: Jocelyn Sanchez Jocelyn D. 12/30/2020 2:15 PM Medical Record Number: [id number] Patient Account Number: [id number] Date of Birth/Sex: Treating RN: 1955-07-03 (65 y.o. Jocelyn Sanchez Primary Care Chesni Vos: Jocelyn Sanchez Other Clinician: Referring Annalucia Laino: Treating Anida Deol/Extender: Jocelyn Sanchez in Treatment: Sanchez Wound Status Wound Number: 4 Primary Pressure Ulcer Etiology: Wound  Location: Right Gluteus Wound Open Wounding Event: Pressure Injury Status: Date Acquired: 12/30/2020 Comorbid Anemia, Deep Vein Thrombosis, Hypertension, Peripheral Sanchez Of Treatment: 0 History: Venous Disease, Type II Diabetes Clustered Wound: No Wound Measurements Length: (cm) 5.3 Width: (cm) 2 Depth: (cm) 0.1 Area: (cm) 8.325 Volume: (cm) 0.833 % Reduction in Area: % Reduction in Volume: Epithelialization: None Tunneling: No Undermining: No Wound Description Classification: Category/Stage III Wound Margin: Flat and Intact Exudate Amount: Medium Exudate Type: Serosanguineous Exudate Color: red, brown Foul Odor After Cleansing: No Slough/Fibrino Yes Wound Bed Granulation Amount: Small (1-33%) Exposed Structure Granulation Quality: Pink Fascia Exposed: No Necrotic Amount: Large (67-100%) Fat Layer (Subcutaneous Tissue) Exposed: Yes Necrotic Quality: Adherent Slough Tendon Exposed: No Muscle Exposed: No Joint Exposed: No Bone Exposed: No Treatment Notes Wound #4 (Gluteus) Wound Laterality: Right Cleanser Soap and Water Discharge Instruction: May shower and wash wound with dial antibacterial soap and water prior to dressing change. Wound Cleanser Discharge Instruction: Cleanse the wound with wound cleanser prior to applying a clean dressing using gauze sponges, not tissue or cotton balls. Peri-Wound Care Skin Prep Discharge Instruction: Use skin prep as directed Topical Primary Dressing Promogran Prisma Matrix, 4.34 (sq in) (silver collagen) Discharge Instruction: Moisten collagen with saline or hydrogel Secondary Dressing Woven Gauze Sponge, Non-Sterile 4x4 in Discharge Instruction: Apply over primary dressing as directed. MPM Excel SAP Bordered Dressing, 7x6.7 (Sacral) (in/in) Discharge Instruction: Apply silicone border over primary dressing as directed. Secured With Compression Wrap Compression Stockings Environmental education officer) Signed:  12/30/2020 6:01:Sanchez PM By: Jocelyn Sanchez Entered By: Jocelyn Sanchez 14:47:58 -------------------------------------------------------------------------------- Vitals Details Patient Name: Date of Service: Jocelyn Sanchez, JA Sanchez Jocelyn D.  12/30/2020 2:15 PM Medical Record Number: [id number] Patient Account Number: [id number] Date of Birth/Sex: Treating RN: 05-27-1956 (65 y.o. Jocelyn Sanchez Primary Care Reesha Debes: Jocelyn Sanchez Other Clinician: Referring Hiro Vipond: Treating Joson Sapp/Extender: Jocelyn Sanchez in Treatment: Sanchez Vital Signs Time Taken: 14:45 Temperature (F): 98.0 Height (in): 63 Pulse (bpm): 88 Weight (lbs): 142 Respiratory Rate (breaths/min): 16 Body Mass Index (BMI): 25.2 Blood Pressure (mmHg): 101/71 Reference Range: 80 - 120 mg / dl Electronic Signature(s) Signed: 12/30/2020 6:01:Sanchez PM By: Jocelyn Sanchez Entered By: Jocelyn Sanchez 14:46:14

## 2021-01-04 NOTE — Progress Notes (Signed)
Jocelyn Sanchez, Jocelyn Sanchez ([id number]) Visit Report for 12/30/2020 HPI Details Patient Name: Date of Service: Reinaldo Meeker [name] 12/30/2020 2:15 PM Medical Record Number: [id number] Patient Account Number: [id number] Date of Birth/Sex: Treating RN: 10-12-1955 (65 y.o. [name], [name] Primary Care Provider: [name] Other Clinician: Referring Provider: Treating Provider/Extender: Drucilla Schmidt Weeks in Treatment: 10 History of Present Illness Location: Midline sacral region Quality: Patient describes this as a soreness Severity: 2 out of 10 Duration: Greater than one year prior to presentation to the wound center Timing: Pain occurs most often with dressing changes Context: Developmental result of pressure over time to the sacral region Modifying Factors: Patient has previously had this wound packed with gauze, she has had a wound VAC, and she tells me other various wound care treatments over the past year. ssociated Signs and Symptoms: Multiple sclerosis, hypertension, diabetes mellitus type 2, lymphedema A HPI Description: 03/27/16 patient presents today on initial evaluation concerning her sacral pressure injury which has been present for roughly one year. She tells me that she does not know of any x-rays or other advanced imaging has been performed in regard to the sacral region. She has been provided with wound care at the [facility] facility at this point in time. She has MS and is not able to move she tells me "at all". She does tell me however that the facility is very good about re-positioning her at least every 2 hours. With that being said she did fell the wound VAC apparently. She had this twice the first time she tells me that it did not seem to be working very well and the second time it actually made things worse this was just 2 months ago. Therefore this was discontinued. at this point in time today she is not having any signs or symptoms of systemic  infection she is on a blood thinner which is Eliquis at this point in time. 03/24/16; patient admitted to our clinic by Jeri Cos last week. She is a lady with advanced MS who is had a wound on her lower sacral area for most of the last year. She reiterates to me that wound vacs have not worked for her in fact she states they've actually made her wound worse on the last occasion. An x- ray was apparently done in the facility although I don't have these results. 03/31/16; this patient had an x-ray done at the facility that did not show evidence of osteomyelitis. Her bone biopsy did not show osteomyelitis. 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 She had been suggested to have collagen on the base of the wound covered with normal saline wet to dry although her intake nurse reports there was no collagen on the wound surface. 05/15/16 wound is not changed that much. This is a substantial stage IV wound with considerable undermining from 7 to 3:00. I have spoken to the wound care nurse at the [facility] facility, there was no records on what exactly caused the wound VAC to fail or records of the same. 06/05/16; I received communication from [facility] skilled facility reporting that the patient refuses the wound back more often that it's on. When I confronted her with this today in the room she denied that she was responsible stating for the most part the  problem is because she has trouble getting them to deal with her Foley catheter properly. I don't exactly follow her line of reasoning here however apparently they've been putting hydrogel wet to dry and in the facility 07/03/16; I have not heard anything from [place]. The patient states she has not had a wound VAC the last 2-3 days due to "battery problems".  She arrives today with wet to dry dressing. She has advanced MS and is a chronic care resident at the skilled facility 07/31/16; patient is a resident of [facility] skilled facility. She has had the VAC applied but did not arrive with the [facility] in place. Her wound generally looks better perhaps somewhat less tunneling superiorly. There is still some exposed bone however. 09/11/16; I follow this patient with advanced MS who is a resident of [facility] skilled facility on a monthly basis. She arrives 6 weeks after her last visit. The wound VAC apparently is no longer being used according to the patient because the nurses in the facility could not maintain a seal in this area. Via the patient it seems that they're using some form of wet to dry dressing although he received no documentation. The patient states she is eating well she is being turned in the facility. 10/09/16; I follow this patient with advanced MS who is a resident of [facility]. She apparently could not have a wound VAC is the facility could not maintain a seal therefore they're using wet-to-dry dressings however the patient arrives today with better measurements and a nicely granulated wound bed. 11/05/16; patient with advanced MS was a pressure area on the lower sacrum/coccyx. She could not have a wound VAC because the facility could n/ot maintain a seal. I ordered Prisma last time. Wound dimensions are about the same per intake 12/04/16; pressure area on the lower sacrum. using collagen. Has a new concern on the right great toe 01/01/17; patient has now only a small open area on the lower sacrumcoccyx she is been doing well and this wound is gradually progressing towards closure. The area on the right great toe that looked to me like a paronychia last time is also quite a bit better although the nail here looks as though it's loosening READMISSION 10/19/2020 This is a patient with advanced multiple sclerosis. We had her in clinic for a  prolonged period in late 2017 to the summer 2018 with a stage IV wound on her lower sacrum/coccyx. I do not think we healed her out although her wound was a lot better. She was a resident at [facility] skilled facility at the time The patient states the wound reopened about 3 or 4 months ago. I am not completely certain from talking to the patient if this is in the same position as last time or even indeed if it ever closed. 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 She has a home health aide who comes twice a day. She had an air mattress but apparently it does not work As far as I can tell there is not been any major change in her medical problem list except she lives at home now. She is a diabetic on metformin 6/23; patient has not been here in about 6-7 weeks. I am not sure what the issue is and the patient does not seem to really know. She takes scat for transportation although that has not  been a problem before. We changed her dressing last time she was here to silver collagen moistened with the backing wet- to-dry and a foam border. Her wound is quite a bit better looking. 7/14; 3-week follow-up. The patient's wound is not as good as last time. It is wider I think more undermining superiorly. Also there appears to be superficial skin breakdown superiorly and the skin around the wound and also from about 2-4 o'clock. Not a particularly viable surface. We have been using silver collagen and backing wet-to-dry. The patient lives in her own home. She apparently has care attendance for almost all of the day. She has a surface on her bed but this is clearly not a level 3. She also has home health. She claims to be eating well Electronic Signature(s) Signed: 12/30/2020 4:30:53 PM By: Linton Ham MD Entered By: Linton Ham on 12/30/2020  15:07:29 -------------------------------------------------------------------------------- Physical Exam Details Patient Name: Date of Service: Reinaldo Meeker [name] 12/30/2020 2:15 PM Medical Record Number: [id number] Patient Account Number: [id number] Date of Birth/Sex: Treating RN: 07-Oct-1955 (65 y.o. [name] Primary Care Provider: [name] Other Clinician: Referring Provider: Treating Provider/Extender: Drucilla Schmidt Weeks in Treatment: 10 Notes Wound exam; lower sacrum. There is still no palpable bone however there is quite an amount of superior undermining I do not think I identified this last time. There is also superficial skin breakdown around the wound all of this suggesting inadequate pressure relief. Fortunately no evidence of infection currently Electronic Signature(s) Signed: 12/30/2020 4:30:53 PM By: Linton Ham MD Entered By: Linton Ham on 12/30/2020 15:08:24 -------------------------------------------------------------------------------- Physician Orders Details Patient Name: Date of Service: Reinaldo Meeker [name] 12/30/2020 2:15 PM Medical Record Number: [id number] Patient Account Number: [id number] Date of Birth/Sex: Treating RN: [birth year] (66 y.o. [name] Primary Care Provider: [name] Other Clinician: Referring Provider: Treating Provider/Extender: Drucilla Schmidt Weeks in Treatment: 10 Verbal / Phone Orders: No Diagnosis Coding ICD-10 Coding Code Description L89.154 Pressure ulcer of sacral region, stage 4 G35 Multiple sclerosis Follow-up Appointments Return appointment in 3 weeks. - Dr. Dellia Nims Bathing/ Shower/ Hygiene May shower with protection but do not get wound dressing(s) wet. - May shower and use soap and water on days dressings are changed Off-Loading A fluidized (Group 3) mattress - order group 3 mattress from medical modalities. ir Turn and reposition every 2  hours Home Health No change in wound care orders this week; continue Home Health for wound care. May utilize formulary equivalent dressing for wound treatment orders unless otherwise specified. - [place] to change Monday's, Wednesday's, and Friday's!! Wound Treatment Wound #3 - Sacrum Cleanser: Soap and Water [facility]) Every Other Day/30 Days Discharge Instructions: May shower and wash wound with dial antibacterial soap and water prior to dressing change. Cleanser: Wound Cleanser [facility]) Every Other Day/30 Days Discharge Instructions: Cleanse the wound with wound cleanser prior to applying a clean dressing using gauze sponges, not tissue or cotton balls. Peri-Wound Care: Skin Prep [facility]) Every Other Day/30 Days Discharge Instructions: Use skin prep as directed Prim Dressing: Promogran Prisma Matrix, 4.34 (sq in) (silver collagen) (Home Health) Every Other Day/30 Days ary Discharge Instructions: Moisten collagen with saline or hydrogel Prim Dressing: Pack with saline moistened gauze on top of prisma [facility]) Every Other Day/30 Days ary Secondary Dressing: Woven Gauze Sponge, Non-Sterile 4x4 in [facility] Every Other Day/30 Days Discharge Instructions: Apply over primary dressing as  directed. Secondary Dressing: MPM Excel SAP Bordered Dressing, 7x6.7 (Sacral) (in/in) (Home Health) Every Other Day/30 Days Discharge Instructions: Apply silicone border over primary dressing as directed. Wound #4 - Gluteus Wound Laterality: Right Cleanser: Soap and Water [facility]) Every Other Day/30 Days Discharge Instructions: May shower and wash wound with dial antibacterial soap and water prior to dressing change. Cleanser: Wound Cleanser [facility]) Every Other Day/30 Days Discharge Instructions: Cleanse the wound with wound cleanser prior to applying a clean dressing using gauze sponges, not tissue or cotton balls. Peri-Wound Care: Skin Prep [facility]) Every  Other Day/30 Days Discharge Instructions: Use skin prep as directed Prim Dressing: Promogran Prisma Matrix, 4.34 (sq in) (silver collagen) (Home Health) Every Other Day/30 Days ary Discharge Instructions: Moisten collagen with saline or hydrogel Secondary Dressing: Woven Gauze Sponge, Non-Sterile 4x4 in [facility]) Every Other Day/30 Days Discharge Instructions: Apply over primary dressing as directed. Secondary Dressing: MPM Excel SAP Bordered Dressing, 7x6.7 (Sacral) (in/in) (Home Health) Every Other Day/30 Days Discharge Instructions: Apply silicone border over primary dressing as directed. Electronic Signature(s) Signed: 12/30/2020 4:30:53 PM By: Linton Ham MD Signed: 12/30/2020 6:14:19 PM By: Deon Pilling Entered By: Deon Pilling on 12/30/2020 14:59:45 -------------------------------------------------------------------------------- Problem List Details Patient Name: Date of Service: Ledora Bottcher, Lindie Spruce [name] 12/30/2020 2:15 PM Medical Record Number: [id number] Patient Account Number: [id number] Date of Birth/Sex: Treating RN: 11/24/1955 (65 y.o. [name], [name] Primary Care Provider: [name] Other Clinician: Referring Provider: Treating Provider/Extender: Drucilla Schmidt Weeks in Treatment: 10 Active Problems ICD-10 Encounter Code Description Active Date MDM Diagnosis L89.154 Pressure ulcer of sacral region, stage 4 10/19/2020 No Yes G35 Multiple sclerosis 10/19/2020 No Yes Inactive Problems Resolved Problems Electronic Signature(s) Signed: 12/30/2020 4:30:53 PM By: Linton Ham MD Entered By: Linton Ham on 12/30/2020 15:03:00 -------------------------------------------------------------------------------- Progress Note Details Patient Name: Date of Service: Reinaldo Meeker [name] 12/30/2020 2:15 PM Medical Record Number: [id number] Patient Account Number: [id number] Date of Birth/Sex: Treating RN: 1956/01/22 (65 y.o. [name], [name] Primary  Care Provider: [name] Other Clinician: Referring Provider: Treating Provider/Extender: Drucilla Schmidt Weeks in Treatment: 10 Subjective History of Present Illness (HPI) The following HPI elements were documented for the patient's wound: Location: Midline sacral region Quality: Patient describes this as a soreness Severity: 2 out of 10 Duration: Greater than one year prior to presentation to the wound center Timing: Pain occurs most often with dressing changes Context: Developmental result of pressure over time to the sacral region Modifying Factors: Patient has previously had this wound packed with gauze, she has had a wound VAC, and she tells me other various wound care treatments over the past year. Associated Signs and Symptoms: Multiple sclerosis, hypertension, diabetes mellitus type 2, lymphedema 03/27/16 patient presents today on initial evaluation concerning her sacral pressure injury which has been present for roughly one year. She tells me that she does not know of any x-rays or other advanced imaging has been performed in regard to the sacral region. She has been provided with wound care at the [facility] facility at this point in time. She has MS and is not able to move she tells me "at all". She does tell me however that the facility is very good about re-positioning her at least every 2 hours. With that being said she did fell the wound VAC apparently. She had this twice the first time she tells me that it did not seem to be working  very well and the second time it actually made things worse this was just 2 months ago. Therefore this was discontinued. at this point in time today she is not having any signs or symptoms of systemic infection she is on a blood thinner which is Eliquis at this point in time. 03/24/16; patient admitted to our clinic by Jeri Cos last week. She is a lady with advanced MS who is had a wound on her lower sacral area for  most of the last year. She reiterates to me that wound vacs have not worked for her in fact she states they've actually made her wound worse on the last occasion. An x- ray was apparently done in the facility although I don't have these results. 03/31/16; this patient had an x-ray done at the facility that did not show evidence of osteomyelitis. Her bone biopsy did not show osteomyelitis. 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 She had been suggested to have collagen on the base of the wound covered with normal saline wet to dry although her intake nurse reports there was no collagen on the wound surface. 05/15/16 wound is not changed that much. This is a substantial stage IV wound with considerable undermining from 7 to 3:00. I have spoken to the wound care nurse at the [facility] facility, there was no records on what exactly caused the wound VAC to fail or records of the same. 06/05/16; I received communication from [facility] skilled facility reporting that the patient refuses the wound back more often that it's on. When I confronted her with this today in the room she denied that she was responsible stating for the most part the problem is because she has trouble getting them to deal with her Foley catheter properly. I don't exactly follow her line of reasoning here however apparently they've been putting hydrogel wet to dry and in the facility 07/03/16; I have not heard anything from [place]. The patient states she has not had a wound VAC the last 2-3 days due to "battery problems". She arrives today with wet to dry dressing. She has advanced MS and is a chronic care resident at the skilled facility 07/31/16; patient is a resident of [facility] skilled facility. She has had the VAC applied but did  not arrive with the [facility] in place. Her wound generally looks better perhaps somewhat less tunneling superiorly. There is still some exposed bone however. 09/11/16; I follow this patient with advanced MS who is a resident of [facility] skilled facility on a monthly basis. She arrives 6 weeks after her last visit. The wound VAC apparently is no longer being used according to the patient because the nurses in the facility could not maintain a seal in this area. Via the patient it seems that they're using some form of wet to dry dressing although he received no documentation. The patient states she is eating well she is being turned in the facility. 10/09/16; I follow this patient with advanced MS who is a resident of [facility]. She apparently could not have a wound VAC is the facility could not maintain a seal therefore they're using wet-to-dry dressings however the patient arrives today with better measurements and a nicely granulated wound bed. 11/05/16; patient with advanced MS was a pressure area on the lower sacrum/coccyx. She could not have a  wound VAC because the facility could n/ot maintain a seal. I ordered Prisma last time. Wound dimensions are about the same per intake 12/04/16; pressure area on the lower sacrum. using collagen. Has a new concern on the right great toe 01/01/17; patient has now only a small open area on the lower sacrumoococcyx she is been doing well and this wound is gradually progressing towards closure. The area on the right great toe that looked to me like a paronychia last time is also quite a bit better although the nail here looks as though it's loosening READMISSION 10/19/2020 This is a patient with advanced multiple sclerosis. We had her in clinic for a prolonged period in late 2017 to the summer 2018 with a stage IV wound on her lower sacrum/coccyx. I do not think we healed her out although her wound was a lot better. She was a resident at [facility] skilled facility at  the time The patient states the wound reopened about 3 or 4 months ago. I am not completely certain from talking to the patient if this is in the same position as last time or even indeed if it ever closed. 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 She has a home health aide who comes twice a day. She had an air mattress but apparently it does not work As far as I can tell there is not been any major change in her medical problem list except she lives at home now. She is a diabetic on metformin 6/23; patient has not been here in about 6-7 weeks. I am not sure what the issue is and the patient does not seem to really know. She takes scat for transportation although that has not been a problem before. We changed her dressing last time she was here to silver collagen moistened with the backing wet- to-dry and a foam border. Her wound is quite a bit better looking. 7/14; 3-week follow-up. The patient's wound is not as good as last time. It is wider I think more undermining superiorly. Also there appears to be superficial skin breakdown superiorly and the skin around the wound and also from about 2-4 o'clock. Not a particularly viable surface. We have been using silver collagen and backing wet-to-dry. The patient lives in her own home. She apparently has care attendance for almost all of the day. She has a surface on her bed but this is clearly not a level 3. She also has home health. She claims to be eating well Objective Constitutional Vitals Time Taken: 2:45 PM, Height: 63 in, Weight: 142 lbs, BMI: 25.2, Temperature: 98.0 F, Pulse: 88 bpm, Respiratory Rate: 16 breaths/min, Blood Pressure: 101/71 mmHg. Integumentary (Hair, Skin) Wound #3 status is Open. Original cause of wound was Pressure Injury. The date acquired was: 07/20/2020. The wound has been in treatment 10 weeks. The wound is  located on the Sacrum. The wound measures 4.7cm length x 3.4cm width x 1.9cm depth; 12.551cm^2 area and 23.846cm^3 volume. There is Fat Layer (Subcutaneous Tissue) exposed. There is no tunneling noted, however, there is undermining starting at 12:00 and ending at 3:00 with a maximum distance of 3cm. There is a medium amount of serosanguineous drainage noted. The wound margin is well defined and not attached to the wound base. There is small (1-33%) pink granulation within the wound bed. There is a large (67-100%) amount of  necrotic tissue within the wound bed including Adherent Slough. Wound #4 status is Open. Original cause of wound was Pressure Injury. The date acquired was: 12/30/2020. The wound is located on the Right Gluteus. The wound measures 5.3cm length x 2cm width x 0.1cm depth; 8.325cm^2 area and 0.833cm^3 volume. There is Fat Layer (Subcutaneous Tissue) exposed. There is no tunneling or undermining noted. There is a medium amount of serosanguineous drainage noted. The wound margin is flat and intact. There is small (1-33%) pink granulation within the wound bed. There is a large (67-100%) amount of necrotic tissue within the wound bed including Adherent Slough. Assessment Active Problems ICD-10 Pressure ulcer of sacral region, stage 4 Multiple sclerosis Plan Follow-up Appointments: Return appointment in 3 weeks. - Dr. Dellia Nims Bathing/ Shower/ Hygiene: May shower with protection but do not get wound dressing(s) wet. - May shower and use soap and water on days dressings are changed Off-Loading: Air fluidized (Group 3) mattress - order group 3 mattress from medical modalities. Turn and reposition every 2 hours Home Health: No change in wound care orders this week; continue Home Health for wound care. May utilize formulary equivalent dressing for wound treatment orders unless otherwise specified. - [place] to change Monday's, Wednesday's, and Friday's!! WOUND #3: - Sacrum  Wound Laterality: Cleanser: Soap and Water [facility]) Every Other Day/30 Days Discharge Instructions: May shower and wash wound with dial antibacterial soap and water prior to dressing change. Cleanser: Wound Cleanser [facility]) Every Other Day/30 Days Discharge Instructions: Cleanse the wound with wound cleanser prior to applying a clean dressing using gauze sponges, not tissue or cotton balls. Peri-Wound Care: Skin Prep [facility]) Every Other Day/30 Days Discharge Instructions: Use skin prep as directed Prim Dressing: Promogran Prisma Matrix, 4.34 (sq in) (silver collagen) (Home Health) Every Other Day/30 Days ary Discharge Instructions: Moisten collagen with saline or hydrogel Prim Dressing: Pack with saline moistened gauze on top of prisma [facility]) Every Other Day/30 Days ary Secondary Dressing: Woven Gauze Sponge, Non-Sterile 4x4 in [facility]) Every Other Day/30 Days Discharge Instructions: Apply over primary dressing as directed. Secondary Dressing: MPM Excel SAP Bordered Dressing, 7x6.7 (Sacral) (in/in) (Home Health) Every Other Day/30 Days Discharge Instructions: Apply silicone border over primary dressing as directed. WOUND #4: - Gluteus Wound Laterality: Right Cleanser: Soap and Water [facility]) Every Other Day/30 Days Discharge Instructions: May shower and wash wound with dial antibacterial soap and water prior to dressing change. Cleanser: Wound Cleanser [facility]) Every Other Day/30 Days Discharge Instructions: Cleanse the wound with wound cleanser prior to applying a clean dressing using gauze sponges, not tissue or cotton balls. Peri-Wound Care: Skin Prep [facility]) Every Other Day/30 Days Discharge Instructions: Use skin prep as directed Prim Dressing: Promogran Prisma Matrix, 4.34 (sq in) (silver collagen) (Home Health) Every Other Day/30 Days ary Discharge Instructions: Moisten collagen with saline or hydrogel Secondary Dressing: Woven Gauze  Sponge, Non-Sterile 4x4 in [facility]) Every Other Day/30 Days Discharge Instructions: Apply over primary dressing as directed. Secondary Dressing: MPM Excel SAP Bordered Dressing, 7x6.7 (Sacral) (in/in) (Home Health) Every Other Day/30 Days Discharge Instructions: Apply silicone border over primary dressing as directed. 1. I continue with the silver collagen backing wet-to-dry for now 2. Our case manager suggested a level 3 surface and I totally agree with this. 3. Obviously not offloading this sufficiently. 4. When she was in the nursing home open brackets [place]] we did use a wound VAC with decent effect. I was not really  clear whether she had enough support at home to warrant this. Electronic Signature(s) Signed: 12/30/2020 4:30:53 PM By: Linton Ham MD Entered By: Linton Ham on 12/30/2020 15:10:00 -------------------------------------------------------------------------------- SuperBill Details Patient Name: Date of Service: Ledora Bottcher, Lindie Spruce [name] 12/30/2020 Medical Record Number: [id number] Patient Account Number: [id number] Date of Birth/Sex: Treating RN: 12-04-1955 (65 y.o. [name] Primary Care Provider: [name] Other Clinician: Referring Provider: Treating Provider/Extender: Drucilla Schmidt Weeks in Treatment: 10 Diagnosis Coding ICD-10 Codes Code Description L89.154 Pressure ulcer of sacral region, stage 4 G35 Multiple sclerosis Facility Procedures CPT4 Code: [id number] Description: 99214 - WOUND CARE VISIT-LEV 4 EST PT Modifier: Quantity: 1 Physician Procedures : CPT4 Code Description Modifier [id number] - WC PHYS LEVEL 3 - EST PT ICD-10 Diagnosis Description L89.154 Pressure ulcer of sacral region, stage 4 G35 Multiple sclerosis Quantity: 1 Electronic Signature(s) Signed: 12/30/2020 6:05:12 PM By: Deon Pilling Signed: 01/04/2021 5:24:30 PM By: Linton Ham MD Previous Signature: 12/30/2020 4:30:53 PM Version By:  Linton Ham MD Entered By: Deon Pilling on 12/30/2020 18:05:12

## 2021-01-20 ENCOUNTER — Other Ambulatory Visit: Payer: Self-pay

## 2021-01-20 ENCOUNTER — Encounter (HOSPITAL_BASED_OUTPATIENT_CLINIC_OR_DEPARTMENT_OTHER): Payer: Medicare Other | Attending: Internal Medicine | Admitting: Internal Medicine

## 2021-01-20 DIAGNOSIS — Z87891 Personal history of nicotine dependence: Secondary | ICD-10-CM | POA: Insufficient documentation

## 2021-01-20 DIAGNOSIS — Z86718 Personal history of other venous thrombosis and embolism: Secondary | ICD-10-CM | POA: Insufficient documentation

## 2021-01-20 DIAGNOSIS — L89154 Pressure ulcer of sacral region, stage 4: Secondary | ICD-10-CM | POA: Diagnosis not present

## 2021-01-20 DIAGNOSIS — G35 Multiple sclerosis: Secondary | ICD-10-CM | POA: Insufficient documentation

## 2021-01-20 DIAGNOSIS — Z7984 Long term (current) use of oral hypoglycemic drugs: Secondary | ICD-10-CM | POA: Diagnosis not present

## 2021-01-20 DIAGNOSIS — Z86711 Personal history of pulmonary embolism: Secondary | ICD-10-CM | POA: Insufficient documentation

## 2021-01-20 DIAGNOSIS — E11622 Type 2 diabetes mellitus with other skin ulcer: Secondary | ICD-10-CM | POA: Diagnosis present

## 2021-01-20 DIAGNOSIS — I890 Lymphedema, not elsewhere classified: Secondary | ICD-10-CM | POA: Insufficient documentation

## 2021-01-21 NOTE — Progress Notes (Signed)
VANN, HEYDE ([id number]) Visit Report for 01/20/2021 Chief Complaint Document Details Patient Name: Date of Service: Reinaldo Meeker [name] 01/20/2021 2:15 PM Medical Record Number: [id number] Patient Account Number: [id number] Date of Birth/Sex: Treating RN: [birth year] (65 y.o. [name], [name] Primary Care Provider: [name] Other Clinician: Referring Provider: Treating Provider/Extender: Dimitri Ped Weeks in Treatment: 13 Information Obtained from: Patient Chief Complaint evaluation for sacral pressure ulcer 10/19/2020; patient is here for review of a sacral pressure ulcer which I think is in the same position as last time Electronic Signature(s) Signed: 01/20/2021 4:29:27 PM By: [name] DO Entered By: [name] on 01/20/2021 16:25:15 -------------------------------------------------------------------------------- Debridement Details Patient Name: Date of Service: Reinaldo Meeker [name] 01/20/2021 2:15 PM Medical Record Number: [id number] Patient Account Number: [id number] Date of Birth/Sex: Treating RN: 02/07/1956 (65 y.o. [name], [name] Primary Care Provider: [name] Other Clinician: Referring Provider: Treating Provider/Extender: Dimitri Ped Weeks in Treatment: 13 Debridement Performed for Assessment: Wound #4 Right Gluteus Performed By: Physician [name], DO Debridement Type: Debridement Level of Consciousness (Pre-procedure): Awake and Alert Pre-procedure Verification/Time Out Yes - 16:10 Taken: Start Time: 16:11 Pain Control: Lidocaine 4% T opical Solution T Area Debrided (L x W): otal 2 (cm) x 3.3 (cm) = 6.6 (cm) Tissue and other material debrided: Viable, Non-Viable, Slough, Subcutaneous, Skin: Dermis , Skin: Epidermis, Fibrin/Exudate, Slough Level: Skin/Subcutaneous Tissue Debridement Description: Excisional Instrument: Curette Bleeding: Minimum Hemostasis Achieved: Pressure End Time:  16:17 Procedural Pain: 0 Post Procedural Pain: 0 Response to Treatment: Procedure was tolerated well Level of Consciousness (Post- Awake and Alert procedure): Post Debridement Measurements of Total Wound Length: (cm) 2 Stage: Category/Stage III Width: (cm) 3.7 Depth: (cm) 0.1 Volume: (cm) 0.581 Character of Wound/Ulcer Post Debridement: Requires Further Debridement Post Procedure Diagnosis Same as Pre-procedure Electronic Signature(s) Signed: 01/20/2021 4:29:27 PM By: [name] DO Signed: 01/20/2021 6:14:36 PM By: Deon Pilling Entered By: Deon Pilling on 01/20/2021 16:18:14 -------------------------------------------------------------------------------- HPI Details Patient Name: Date of Service: [name] 01/20/2021 2:15 PM Medical Record Number: [id number] Patient Account Number: [id number] Date of Birth/Sex: Treating RN: [birth year] (65 y.o. [name], [name] Primary Care Provider: [name] Other Clinician: Referring Provider: Treating Provider/Extender: Dimitri Ped Weeks in Treatment: 13 History of Present Illness Location: Midline sacral region Quality: Patient describes this as a soreness Severity: 2 out of 10 Duration: Greater than one year prior to presentation to the wound center Timing: Pain occurs most often with dressing changes Context: Developmental result of pressure over time to the sacral region Modifying Factors: Patient has previously had this wound packed with gauze, she has had a wound VAC, and she tells me other various wound care treatments over the past year. ssociated Signs and Symptoms: Multiple sclerosis, hypertension, diabetes mellitus type 2, lymphedema A HPI Description: 03/27/16 patient presents today on initial evaluation concerning her sacral pressure injury which has been present for roughly one year. She tells me that she does not know of any x-rays or other advanced imaging has been performed in regard  to the sacral region. She has been provided with wound care at the [facility] facility at this point in time. She has MS and is not able to move she tells me "at all". She does tell me however that the facility is very good about re-positioning her at least every 2 hours. With that being said she did fell the wound  VAC apparently. She had this twice the first time she tells me that it did not seem to be working very well and the second time it actually made things worse this was just 2 months ago. Therefore this was discontinued. at this point in time today she is not having any signs or symptoms of systemic infection she is on a blood thinner which is Eliquis at this point in time. 03/24/16; patient admitted to our clinic by Jeri Cos last week. She is a lady with advanced MS who is had a wound on her lower sacral area for most of the last year. She reiterates to me that wound vacs have not worked for her in fact she states they've actually made her wound worse on the last occasion. An x- ray was apparently done in the facility although I don't have these results. 03/31/16; this patient had an x-ray done at the facility that did not show evidence of osteomyelitis. Her bone biopsy did not show osteomyelitis. 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 She had been suggested to have collagen on the base of the wound covered with normal saline wet to dry although her intake nurse reports there was no collagen on the wound surface. 05/15/16 wound is not changed that much. This is a substantial stage IV wound with considerable undermining from 7 to 3:00. I have spoken to the wound care nurse at the [facility] facility, there was no records on what exactly caused the wound VAC to fail or records of the  same. 06/05/16; I received communication from [facility] skilled facility reporting that the patient refuses the wound back more often that it's on. When I confronted her with this today in the room she denied that she was responsible stating for the most part the problem is because she has trouble getting them to deal with her Foley catheter properly. I don't exactly follow her line of reasoning here however apparently they've been putting hydrogel wet to dry and in the facility 07/03/16; I have not heard anything from [place]. The patient states she has not had a wound VAC the last 2-3 days due to "battery problems". She arrives today with wet to dry dressing. She has advanced MS and is a chronic care resident at the skilled facility 07/31/16; patient is a resident of [facility] skilled facility. She has had the VAC applied but did not arrive with the [facility] in place. Her wound generally looks better perhaps somewhat less tunneling superiorly. There is still some exposed bone however. 09/11/16; I follow this patient with advanced MS who is a resident of [facility] skilled facility on a monthly basis. She arrives 6 weeks after her last visit. The wound VAC apparently is no longer being used according to the patient because the nurses in the facility could not maintain a seal in this area. Via the patient it seems that they're using some form of wet to dry dressing although he received no documentation. The patient states she is eating well she is being turned in the facility. 10/09/16; I follow this patient with advanced MS who is a resident of [facility]. She apparently could not have a wound VAC is the facility could not maintain a seal therefore they're using wet-to-dry dressings however the patient arrives today with better measurements and a nicely granulated wound  bed. 11/05/16; patient with advanced MS was a pressure area on the lower sacrum/coccyx. She could not have a wound VAC because the  facility could n/ot maintain a seal. I ordered Prisma last time. Wound dimensions are about the same per intake 12/04/16; pressure area on the lower sacrum. using collagen. Has a new concern on the right great toe 01/01/17; patient has now only a small open area on the lower sacrumcoccyx she is been doing well and this wound is gradually progressing towards closure. The area on the right great toe that looked to me like a paronychia last time is also quite a bit better although the nail here looks as though it's loosening READMISSION 10/19/2020 This is a patient with advanced multiple sclerosis. We had her in clinic for a prolonged period in late 2017 to the summer 2018 with a stage IV wound on her lower sacrum/coccyx. I do not think we healed her out although her wound was a lot better. She was a resident at [facility] skilled facility at the time The patient states the wound reopened about 3 or 4 months ago. I am not completely certain from talking to the patient if this is in the same position as last time or even indeed if it ever closed. 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 She has a home health aide who comes twice a day. She had an air mattress but apparently it does not work As far as I can tell there is not been any major change in her medical problem list except she lives at home now. She is a diabetic on metformin 6/23; patient has not been here in about 6-7 weeks. I am not sure what the issue is and the patient does not seem to really know. She takes scat for transportation although that has not been a problem before. We changed her dressing last time she was here to silver collagen moistened with the backing wet- to-dry and a foam border. Her wound is quite a bit better looking. 7/14; 3-week follow-up. The patient's wound is not as good as last time. It is wider I think more  undermining superiorly. Also there appears to be superficial skin breakdown superiorly and the skin around the wound and also from about 2-4 o'clock. Not a particularly viable surface. We have been using silver collagen and backing wet-to-dry. The patient lives in her own home. She apparently has care attendance for almost all of the day. She has a surface on her bed but this is clearly not a level 3. She also has home health. She claims to be eating well 8/4; patient presents for 3-week follow-up. She has no issues or complaints today. She reports using wet-to-dry dressings for her sacral wound and collagen to the buttocks wound. She states that her new mattress is arriving today Electronic Signature(s) Signed: 01/20/2021 4:29:27 PM By: [name] DO Entered By: [name] on 01/20/2021 16:26:11 -------------------------------------------------------------------------------- Physical Exam Details Patient Name: Date of Service: Reinaldo Meeker [name] 01/20/2021 2:15 PM Medical Record Number: [id number] Patient Account Number: [id number] Date of Birth/Sex: Treating RN: Sep 26, 1955 (65 y.o. [name] Primary Care Provider: [name] Other Clinician: Referring Provider: Treating Provider/Extender: Dimitri Ped Weeks in Treatment: 13 Constitutional respirations regular, non-labored and within target range for patient.Marland Kitchen Psychiatric pleasant and cooperative. Notes Sacral wound  with granulation tissue throughout. Does not probe to bone. There is also superficial skin breakdown to the right gluteus with nonviable tissue. Post debridement this had granulation tissue. No signs of infection. Electronic Signature(s) Signed: 01/20/2021 4:29:27 PM By: [name] DO Entered By: [name] on 01/20/2021 16:27:28 -------------------------------------------------------------------------------- Physician Orders Details Patient Name: Date of Service: Reinaldo Meeker [name] 01/20/2021 2:15 PM Medical Record Number: [id number] Patient Account Number: [id number] Date of Birth/Sex: Treating RN: [birth year] (65 y.o. [name] Primary Care Provider: [name] Other Clinician: Referring Provider: Treating Provider/Extender: Dimitri Ped Weeks in Treatment: 36 Verbal / Phone Orders: No Diagnosis Coding ICD-10 Coding Code Description L89.154 Pressure ulcer of sacral region, stage 4 G35 Multiple sclerosis Follow-up Appointments Return appointment in 3 weeks. - Dr. Dellia Nims ***60 minutes extra time hoyer**** Bathing/ Shower/ Hygiene May shower with protection but do not get wound dressing(s) wet. - May shower and use soap and water on days dressings are changed Off-Loading Low air-loss mattress (Group 2) - patient to use the medical modalities mattress. Per patient arriving today. Turn and reposition every 2 hours Home Health No change in wound care orders this week; continue Home Health for wound care. May utilize formulary equivalent dressing for wound treatment orders unless otherwise specified. - [place] to change Monday's, Wednesday's, and Friday's!! Wound Treatment Wound #3 - Sacrum Cleanser: Soap and Water [facility]) Every Other Day/30 Days Discharge Instructions: May shower and wash wound with dial antibacterial soap and water prior to dressing change. Cleanser: Wound Cleanser [facility]) Every Other Day/30 Days Discharge Instructions: Cleanse the wound with wound cleanser prior to applying a clean dressing using gauze sponges, not tissue or cotton balls. Peri-Wound Care: Skin Prep [facility]) Every Other Day/30 Days Discharge Instructions: Use skin prep as directed Prim Dressing: Promogran Prisma Matrix, 4.34 (sq in) (silver collagen) (Home Health) Every Other Day/30 Days ary Discharge Instructions: Moisten collagen with saline or hydrogel Prim Dressing: Pack with saline moistened gauze on  top of prisma [facility]) Every Other Day/30 Days ary Secondary Dressing: Woven Gauze Sponge, Non-Sterile 4x4 in [facility]) Every Other Day/30 Days Discharge Instructions: Apply over primary dressing as directed. Secondary Dressing: MPM Excel SAP Bordered Dressing, 7x6.7 (Sacral) (in/in) (Home Health) Every Other Day/30 Days Discharge Instructions: Apply silicone border over primary dressing as directed. Wound #4 - Gluteus Wound Laterality: Right Cleanser: Soap and Water [facility]) Every Other Day/30 Days Discharge Instructions: May shower and wash wound with dial antibacterial soap and water prior to dressing change. Cleanser: Wound Cleanser [facility]) Every Other Day/30 Days Discharge Instructions: Cleanse the wound with wound cleanser prior to applying a clean dressing using gauze sponges, not tissue or cotton balls. Peri-Wound Care: Skin Prep [facility]) Every Other Day/30 Days Discharge Instructions: Use skin prep as directed Prim Dressing: Promogran Prisma Matrix, 4.34 (sq in) (silver collagen) (Home Health) Every Other Day/30 Days ary Discharge Instructions: Moisten collagen with saline or hydrogel Secondary Dressing: Woven Gauze Sponge, Non-Sterile 4x4 in [facility]) Every Other Day/30 Days Discharge Instructions: Apply over primary dressing as directed. Secondary Dressing: MPM Excel SAP Bordered Dressing, 7x6.7 (Sacral) (in/in) (Home Health) Every Other Day/30 Days Discharge Instructions: Apply silicone border over primary dressing as directed. Notes Patient was not on an overlay air mattress group 2 home before group 3 order. Electronic Signature(s) Signed: 01/20/2021 4:29:27 PM By: [name] DO Entered By: [name] on 01/20/2021 16:27:40 -------------------------------------------------------------------------------- Problem List Details Patient  Name: Date of Service: Reinaldo Meeker [name] 01/20/2021 2:15 PM Medical Record Number: [id number] Patient  Account Number: [id number] Date of Birth/Sex: Treating RN: 1956/05/11 (65 y.o. [name] Primary Care Provider: Other Clinician: [name] Referring Provider: Treating Provider/Extender: Dimitri Ped Weeks in Treatment: 28 Active Problems ICD-10 Encounter Code Description Active Date MDM Diagnosis L89.154 Pressure ulcer of sacral region, stage 4 10/19/2020 No Yes G35 Multiple sclerosis 10/19/2020 No Yes Inactive Problems Resolved Problems Electronic Signature(s) Signed: 01/20/2021 4:29:27 PM By: [name] DO Entered By: [name] on 01/20/2021 16:24:45 -------------------------------------------------------------------------------- Progress Note Details Patient Name: Date of Service: Reinaldo Meeker [name] 01/20/2021 2:15 PM Medical Record Number: [id number] Patient Account Number: [id number] Date of Birth/Sex: Treating RN: 1955-12-20 (65 y.o. [name], [name] Primary Care Provider: [name] Other Clinician: Referring Provider: Treating Provider/Extender: Dimitri Ped Weeks in Treatment: 32 Subjective Chief Complaint Information obtained from Patient evaluation for sacral pressure ulcer 10/19/2020; patient is here for review of a sacral pressure ulcer which I think is in the same position as last time History of Present Illness (HPI) The following HPI elements were documented for the patient's wound: Location: Midline sacral region Quality: Patient describes this as a soreness Severity: 2 out of 10 Duration: Greater than one year prior to presentation to the wound center Timing: Pain occurs most often with dressing changes Context: Developmental result of pressure over time to the sacral region Modifying Factors: Patient has previously had this wound packed with gauze, she has had a wound VAC, and she tells me other various wound care treatments over the past year. Associated Signs and Symptoms: Multiple  sclerosis, hypertension, diabetes mellitus type 2, lymphedema 03/27/16 patient presents today on initial evaluation concerning her sacral pressure injury which has been present for roughly one year. She tells me that she does not know of any x-rays or other advanced imaging has been performed in regard to the sacral region. She has been provided with wound care at the [facility] facility at this point in time. She has MS and is not able to move she tells me "at all". She does tell me however that the facility is very good about re-positioning her at least every 2 hours. With that being said she did fell the wound VAC apparently. She had this twice the first time she tells me that it did not seem to be working very well and the second time it actually made things worse this was just 2 months ago. Therefore this was discontinued. at this point in time today she is not having any signs or symptoms of systemic infection she is on a blood thinner which is Eliquis at this point in time. 03/24/16; patient admitted to our clinic by Jeri Cos last week. She is a lady with advanced MS who is had a wound on her lower sacral area for most of the last year. She reiterates to me that wound vacs have not worked for her in fact she states they've actually made her wound worse on the last occasion. An x- ray was apparently done in the facility although I don't have these results. 03/31/16; this patient had an x-ray done at the facility that did not show evidence of osteomyelitis. Her bone biopsy did not show osteomyelitis. 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 We are using collagen moist gauze border foam change every second day  05/01/16; substantial stage IV wound in a patient with advanced MS. She had been suggested to have collagen on the base of the wound covered with normal saline wet to dry although her intake nurse reports there was no collagen  on the wound surface. 05/15/16 wound is not changed that much. This is a substantial stage IV wound with considerable undermining from 7 to 3:00. I have spoken to the wound care nurse at the [facility] facility, there was no records on what exactly caused the wound VAC to fail or records of the same. 06/05/16; I received communication from [facility] skilled facility reporting that the patient refuses the wound back more often that it's on. When I confronted her with this today in the room she denied that she was responsible stating for the most part the problem is because she has trouble getting them to deal with her Foley catheter properly. I don't exactly follow her line of reasoning here however apparently they've been putting hydrogel wet to dry and in the facility 07/03/16; I have not heard anything from [place]. The patient states she has not had a wound VAC the last 2-3 days due to "battery problems". She arrives today with wet to dry dressing. She has advanced MS and is a chronic care resident at the skilled facility 07/31/16; patient is a resident of [facility] skilled facility. She has had the VAC applied but did not arrive with the [facility] in place. Her wound generally looks better perhaps somewhat less tunneling superiorly. There is still some exposed bone however. 09/11/16; I follow this patient with advanced MS who is a resident of [facility] skilled facility on a monthly basis. She arrives 6 weeks after her last visit. The wound VAC apparently is no longer being used according to the patient because the nurses in the facility could not maintain a seal in this area. Via the patient it seems that they're using some form of wet to dry dressing although he received no documentation. The patient states she is eating well she is being turned in the facility. 10/09/16; I follow this patient with advanced MS who is a resident of [facility]. She apparently could not have a wound VAC is the facility  could not maintain a seal therefore they're using wet-to-dry dressings however the patient arrives today with better measurements and a nicely granulated wound bed. 11/05/16; patient with advanced MS was a pressure area on the lower sacrum/coccyx. She could not have a wound VAC because the facility could n/ot maintain a seal. I ordered Prisma last time. Wound dimensions are about the same per intake 12/04/16; pressure area on the lower sacrum. using collagen. Has a new concern on the right great toe 01/01/17; patient has now only a small open area on the lower sacrumoococcyx she is been doing well and this wound is gradually progressing towards closure. The area on the right great toe that looked to me like a paronychia last time is also quite a bit better although the nail here looks as though it's loosening READMISSION 10/19/2020 This is a patient with advanced multiple sclerosis. We had her in clinic for a prolonged period in late 2017 to the summer 2018 with a stage IV wound on her lower sacrum/coccyx. I do not think we healed her out although her wound was a lot better. She was a resident at [facility] skilled facility at the time The patient states the wound reopened about 3 or 4 months ago. I am not completely certain from talking  to the patient if this is in the same position as last time or even indeed if it ever closed. 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 She has a home health aide who comes twice a day. She had an air mattress but apparently it does not work As far as I can tell there is not been any major change in her medical problem list except she lives at home now. She is a diabetic on metformin 6/23; patient has not been here in about 6-7 weeks. I am not sure what the issue is and the patient does not seem to really know. She takes scat for transportation although that has  not been a problem before. We changed her dressing last time she was here to silver collagen moistened with the backing wet- to-dry and a foam border. Her wound is quite a bit better looking. 7/14; 3-week follow-up. The patient's wound is not as good as last time. It is wider I think more undermining superiorly. Also there appears to be superficial skin breakdown superiorly and the skin around the wound and also from about 2-4 o'clock. Not a particularly viable surface. We have been using silver collagen and backing wet-to-dry. The patient lives in her own home. She apparently has care attendance for almost all of the day. She has a surface on her bed but this is clearly not a level 3. She also has home health. She claims to be eating well 8/4; patient presents for 3-week follow-up. She has no issues or complaints today. She reports using wet-to-dry dressings for her sacral wound and collagen to the buttocks wound. She states that her new mattress is arriving today Patient History Information obtained from Patient. Family History No family history of Cancer, Diabetes, Heart Disease, Hypertension, Kidney Disease, Lung Disease, Seizures, Stroke, Thyroid Problems, Tuberculosis. Social History Former smoker, Marital Status - Divorced, Alcohol Use - Never, Drug Use - No History, Caffeine Use - Rarely. 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 Vitals Time Taken: 3:14 PM, Height: 63 in, Weight: 142 lbs, BMI: 25.2, Temperature: 97.7 F, Pulse: 74 bpm, Respiratory Rate: 17 breaths/min, Blood Pressure: 92/54 mmHg. Psychiatric pleasant and cooperative. General Notes: Sacral wound with granulation tissue throughout. Does not probe to bone. There is also superficial skin breakdown to the right gluteus with nonviable tissue. Post debridement this had granulation tissue. No signs of infection. Integumentary (Hair, Skin) Wound #3 status is Open. Original cause of wound was Pressure Injury. The date acquired was: 07/20/2020. The wound has been in treatment 13 weeks. The wound is located on the Sacrum. The wound measures 4.5cm length x 3cm width x 2.5cm depth; 10.603cm^2 area and 26.507cm^3 volume. There is Fat Layer (Subcutaneous Tissue) exposed. There is no tunneling noted, however, there is undermining starting at 10:00 and ending at 12:00 with a maximum distance of 5cm. There is a medium amount of  serosanguineous drainage noted. The wound margin is well defined and not attached to the wound base. There is small (1- 33%) pink granulation within the wound bed. There is a large (67-100%) amount of necrotic tissue within the wound bed including Adherent Slough. Wound #4 status is Open. Original cause of wound was Pressure Injury. The date acquired was: 12/30/2020. The wound has been in treatment 3 weeks. The wound is located on the Right Gluteus. The wound measures 2cm length x 3.7cm width x 0.1cm depth; 5.812cm^2 area and 0.581cm^3 volume. There is Fat Layer (Subcutaneous Tissue) exposed. There is no tunneling or undermining noted. There is a medium amount of serosanguineous drainage noted. The wound margin is flat and intact. There is small (1-33%) pink granulation within the wound bed. There is a large (67-100%) amount  of necrotic tissue within the wound bed including Adherent Slough. Assessment Active Problems ICD-10 Pressure ulcer of sacral region, stage 4 Multiple sclerosis Patients sacral wound is stable. The right gluteus wound has shown improvement in size. I debrided nonviable tissue to the right gluteal wound. No signs of infection to either wounds. Continue with previous treatment. Procedures Wound #4 Pre-procedure diagnosis of Wound #4 is a Pressure Ulcer located on the Right Gluteus . There was a Excisional Skin/Subcutaneous Tissue Debridement with a total area of 6.6 sq cm performed by [name], DO. With the following instrument(s): Curette to remove Viable and Non-Viable tissue/material. Material removed includes Subcutaneous Tissue, Slough, Skin: Dermis, Skin: Epidermis, and Fibrin/Exudate after achieving pain control using Lidocaine 4% Topical Solution. A time out was conducted at 16:10, prior to the start of the procedure. A Minimum amount of bleeding was controlled with Pressure. The procedure was tolerated well with a pain level of 0 throughout and a pain level of 0 following the procedure. Post Debridement Measurements: 2cm length x 3.7cm width x 0.1cm depth; 0.581cm^3 volume. Post debridement Stage noted as Category/Stage III. Character of Wound/Ulcer Post Debridement requires further debridement. Post procedure Diagnosis Wound #4: Same as Pre-Procedure Plan Follow-up Appointments: Return appointment in 3 weeks. - Dr. Dellia Nims ***60 minutes extra time hoyer**** Bathing/ Shower/ Hygiene: May shower with protection but do not get wound dressing(s) wet. - May shower and use soap and water on days dressings are changed Off-Loading: Low air-loss mattress (Group 2) - patient to use the medical modalities mattress. Per patient arriving today. Turn and reposition every 2 hours Home Health: No change in wound care orders this week; continue Home Health for wound care. May utilize  formulary equivalent dressing for wound treatment orders unless otherwise specified. - [employer] to change Monday's, Wednesday's, and Friday's!! General Notes: Patient was not on an overlay air mattress group 2 home before group 3 order. WOUND #3: - Sacrum Wound Laterality: Cleanser: Soap and Water [facility]) Every Other Day/30 Days Discharge Instructions: May shower and wash wound with dial antibacterial soap and water prior to dressing change. Cleanser: Wound Cleanser [facility]) Every Other Day/30 Days Discharge Instructions: Cleanse the wound with wound cleanser prior to applying a clean dressing using gauze sponges, not tissue or cotton balls. Peri-Wound Care: Skin Prep [facility]) Every Other Day/30 Days Discharge Instructions: Use skin prep as directed Prim Dressing: Promogran Prisma Matrix, 4.34 (sq in) (silver collagen) (Home Health) Every Other Day/30 Days ary Discharge Instructions: Moisten collagen with saline or hydrogel Prim Dressing: Pack with saline moistened gauze on top of prisma [facility]) Every Other Day/30 Days ary Secondary Dressing: Woven Gauze Sponge, Non-Sterile 4x4  in [facility]) Every Other Day/30 Days Discharge Instructions: Apply over primary dressing as directed. Secondary Dressing: MPM Excel SAP Bordered Dressing, 7x6.7 (Sacral) (in/in) (Home Health) Every Other Day/30 Days Discharge Instructions: Apply silicone border over primary dressing as directed. WOUND #4: - Gluteus Wound Laterality: Right Cleanser: Soap and Water [facility]) Every Other Day/30 Days Discharge Instructions: May shower and wash wound with dial antibacterial soap and water prior to dressing change. Cleanser: Wound Cleanser [facility]) Every Other Day/30 Days Discharge Instructions: Cleanse the wound with wound cleanser prior to applying a clean dressing using gauze sponges, not tissue or cotton balls. Peri-Wound Care: Skin Prep [facility]) Every Other Day/30  Days Discharge Instructions: Use skin prep as directed Prim Dressing: Promogran Prisma Matrix, 4.34 (sq in) (silver collagen) (Home Health) Every Other Day/30 Days ary Discharge Instructions: Moisten collagen with saline or hydrogel Secondary Dressing: Woven Gauze Sponge, Non-Sterile 4x4 in [facility]) Every Other Day/30 Days Discharge Instructions: Apply over primary dressing as directed. Secondary Dressing: MPM Excel SAP Bordered Dressing, 7x6.7 (Sacral) (in/in) (Home Health) Every Other Day/30 Days Discharge Instructions: Apply silicone border over primary dressing as directed. 1. In office sharp debridement 2. Continue wet-to-dry dressings to the sacral wound and collagen to the right glued wound 3. Follow-up in 3 weeks Electronic Signature(s) Signed: 01/20/2021 4:29:27 PM By: [name] DO Entered By: [name] on 01/20/2021 16:28:49 -------------------------------------------------------------------------------- HxROS Details Patient Name: Date of Service: Reinaldo Meeker [name] 01/20/2021 2:15 PM Medical Record Number: [id number] Patient Account Number: [id number] Date of Birth/Sex: Treating RN: 29-Nov-1955 (65 y.o. [name], [name] Primary Care Provider: [name] Other Clinician: Referring Provider: Treating Provider/Extender: Dimitri Ped Weeks in Treatment: 13 Information Obtained From Patient Hematologic/Lymphatic Medical History: Positive for: Anemia Negative for: Lymphedema Respiratory Medical History: Past Medical History Notes: h/o bilat pulmonary embolism Cardiovascular Medical History: Positive for: Deep Vein Thrombosis - (R) leg; Hypertension; Peripheral Venous Disease Endocrine Medical History: Positive for: Type II Diabetes Time with diabetes: 8 years Treated with: Oral agents Blood sugar tested every day: No Integumentary (Skin) Medical History: Negative for: History of Burn Musculoskeletal Medical  History: Past Medical History Notes: MS , 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) Neurologic Medical History: Past Medical History Notes: Multiple Sclerosis Psychiatric Medical History: Negative for: Anorexia/bulimia; Confinement Anxiety Past Medical History Notes: anxiety Immunizations Pneumococcal Vaccine: Received Pneumococcal Vaccination: Yes Received Pneumococcal Vaccination On or After 60th Birthday: No Immunization Notes: last tetanus about 3 years ago Implantable Devices None Family and Social History Cancer: No; Diabetes: No; Heart Disease: No; Hypertension: No; Kidney Disease: No; Lung Disease: No; Seizures: No; Stroke: No; Thyroid Problems: No; Tuberculosis: No; Former smoker; Marital Status - Divorced; Alcohol Use: Never; Drug Use: No History; Caffeine Use: Rarely; Financial Concerns: No; Food, Clothing or Shelter Needs: No; Support System Lacking: No; Transportation Concerns: No Electronic Signature(s) Signed: 01/20/2021 4:29:27 PM By: [name] DO Signed: 01/20/2021 6:14:36 PM By: Deon Pilling Entered By: [name] on 01/20/2021 16:26:23 -------------------------------------------------------------------------------- SuperBill Details Patient Name: Date of Service: Reinaldo Meeker [name] 01/20/2021 Medical Record Number: [id number] Patient Account Number: [id number] Date of Birth/Sex: Treating RN: Nov 09, 1955 (65 y.o. [name] Primary Care Provider: [name] Other Clinician: Referring Provider: Treating Provider/Extender: Dimitri Ped Weeks in Treatment: 13 Diagnosis Coding ICD-10 Codes Code Description L89.154 Pressure ulcer of sacral region, stage [place] Multiple sclerosis Facility Procedures CPT4 Code: [id number] Description: [id number] - DEB  SUBQ TISSUE 20 SQ CM/< ICD-10 Diagnosis Description L89.154 Pressure ulcer of sacral region, stage 4 G35 Multiple sclerosis Modifier: Quantity: 1 Physician Procedures :  CPT4 Code Description Modifier [id number] - WC PHYS SUBQ TISS 20 SQ CM 1 ICD-10 Diagnosis Description L89.154 Pressure ulcer of sacral region, stage 4 G35 Multiple sclerosis Quantity: Electronic Signature(s) Signed: 01/20/2021 4:29:27 PM By: [name] DO Entered By: [name] on 01/20/2021 [id number]

## 2021-01-25 NOTE — Progress Notes (Addendum)
Jocelyn Sanchez ([id number]) Visit Report for 01/20/2021 Arrival Information Details Patient Name: Date of Service: Jocelyn Sanchez Jocelyn D. 01/20/2021 2:15 PM Medical Record Number: [id number] Patient Account Number: [id number] Date of Birth/Sex: Treating RN: Sep 29, 1955 (65 y.o. Jocelyn Sanchez Jocelyn Sanchez: Jocelyn Sanchez Other Clinician: Referring Jocelyn Sanchez: Treating Jocelyn Sanchez/Extender: Jocelyn Sanchez Weeks in Treatment: 13 Visit Information History Since Last Visit Added or deleted any medications: No Patient Arrived: Wheel Chair Any new allergies or adverse reactions: No Arrival Time: 15:14 Had a fall or experienced change in No Accompanied By: daughter activities of Jocelyn living that may affect Transfer Assistance: None risk of falls: Patient Identification Verified: Yes Signs or symptoms of abuse/neglect since last visito No Secondary Verification Process Completed: Yes Hospitalized since last visit: No Patient Has Alerts: Yes Implantable device outside of the clinic excluding No Patient Alerts: Patient on Blood Thinner cellular tissue based products placed in the center since last visit: Has Dressing in Place as Prescribed: Yes Pain Present Now: No Electronic Signature(s) Signed: 01/25/2021 5:35:30 PM By: Rhae Hammock RN Entered By: Rhae Hammock on 01/20/2021 15:15:04 -------------------------------------------------------------------------------- Complex / Palliative Patient Assessment Details Patient Name: Date of Service: Jocelyn Sanchez Jocelyn D. 01/20/2021 2:15 PM Medical Record Number: [id number] Patient Account Number: [id number] Date of Birth/Sex: Treating RN: 1955-12-11 (65 y.o. Jocelyn Sanchez Jocelyn Care Damario Gillie: Jocelyn Sanchez Other Clinician: Referring Lareina Espino: Treating Angelee Bahr/Extender: Jocelyn Sanchez Weeks in Treatment: 13 Palliative Management Criteria Complex Wound Management Criteria Patient  has remarkable or complex co-morbidities requiring medications or treatments that extend wound healing times. Examples: Diabetes mellitus with chronic renal failure or end stage renal disease requiring dialysis Advanced or poorly controlled rheumatoid arthritis Diabetes mellitus and end stage chronic obstructive pulmonary disease Active cancer with current chemo- or radiation therapy Type 2 DM, Multiple Sclerosis, CHF, non ambulatory Care Approach Wound Care Plan: Complex Wound Management Electronic Signature(s) Signed: 02/15/2021 4:16:59 PM By: Jocelyn Shan DO Signed: 03/17/2021 4:50:18 PM By: Jocelyn Hurst RN, BSN Entered By: Jocelyn Sanchez on 02/11/2021 12:27:50 -------------------------------------------------------------------------------- Encounter Discharge Information Details Patient Name: Date of Service: Jocelyn Sanchez Jocelyn D. 01/20/2021 2:15 PM Medical Record Number: [id number] Patient Account Number: [id number] Date of Birth/Sex: Treating RN: 16-Dec-1955 (65 y.o. Jocelyn Sanchez Jocelyn Care Deboraha Goar: Jocelyn Sanchez Other Clinician: Referring Wilsie Kern: Treating Itzabella Sorrels/Extender: Jocelyn Sanchez Weeks in Treatment: 105 Encounter Discharge Information Items Post Procedure Vitals Discharge Condition: Stable Temperature (F): 97.7 Ambulatory Status: Wheelchair Pulse (bpm): 74 Discharge Destination: Home Respiratory Rate (breaths/min): 17 Transportation: Private Auto Blood Pressure (mmHg): 117/74 Accompanied By: self Schedule Follow-up Appointment: Yes Clinical Summary of Care: Patient Declined Electronic Signature(s) Signed: 01/25/2021 5:35:30 PM By: Rhae Hammock RN Entered By: Rhae Hammock on 01/20/2021 17:14:18 -------------------------------------------------------------------------------- Lower Extremity Assessment Details Patient Name: Date of Service: Jocelyn Sanchez Jocelyn D. 01/20/2021 2:15 PM Medical Record Number: [id number] Patient  Account Number: [id number] Date of Birth/Sex: Treating RN: February 22, 1956 (65 y.o. Jocelyn Sanchez Jocelyn Care Jocelyn Sanchez: Jocelyn Sanchez Other Clinician: Referring Aanchal Cope: Treating Matthieu Loftus/Extender: Jocelyn Sanchez Weeks in Treatment: 13 Electronic Signature(s) Signed: 01/25/2021 5:35:30 PM By: Rhae Hammock RN Entered By: Rhae Hammock on 01/20/2021 15:14:10 -------------------------------------------------------------------------------- Multi Wound Chart Details Patient Name: Date of Service: Jocelyn Sanchez Jocelyn D. 01/20/2021 2:15 PM Medical Record Number: [id number] Patient Account Number: [id number] Date of Birth/Sex: Treating RN: 1955-08-30 (65 y.o. Jocelyn Sanchez Jocelyn Care Xitlalic Maslin: Jocelyn Sanchez  S Other Clinician: Referring Tyger Oka: Treating Amire Gossen/Extender: Trellis Paganini, Lorenda Ishihara Weeks in Treatment: 13 Vital Signs Height(in): 63 Pulse(bpm): 74 Weight(lbs): 142 Blood Pressure(mmHg): 92/54 Body Mass Index(BMI): 25 Temperature(F): 97.7 Respiratory Rate(breaths/min): 17 Photos: [N/A:N/A] Sacrum Right Gluteus N/A Wound Location: Pressure Injury Pressure Injury N/A Wounding Event: Pressure Ulcer Pressure Ulcer N/A Jocelyn Etiology: Anemia, Deep Vein Thrombosis, Anemia, Deep Vein Thrombosis, N/A Comorbid History: Hypertension, Peripheral Venous Hypertension, Peripheral Venous Disease, Type II Diabetes Disease, Type II Diabetes 07/20/2020 12/30/2020 N/A Date Acquired: 13 3 N/A Weeks of Treatment: Open Open N/A Wound Status: 4.5x3x2.5 2x3.7x0.1 N/A Measurements L x W x D (cm) 10.603 5.812 N/A A (cm) : rea 26.507 0.581 N/A Volume (cm) : -80.00% 30.20% N/A % Reduction in A rea: -200.00% 30.30% N/A % Reduction in Volume: 10 Starting Position 1 (o'clock): 12 Ending Position 1 (o'clock): 5 Maximum Distance 1 (cm): Yes No N/A Undermining: Category/Stage III Category/Stage III N/A Classification: Medium Medium  N/A Exudate A mount: Serosanguineous Serosanguineous N/A Exudate Type: red, brown red, brown N/A Exudate Color: Well defined, not attached Flat and Intact N/A Wound Margin: Small (1-33%) Small (1-33%) N/A Granulation A mount: Pink Pink N/A Granulation Quality: Large (67-100%) Large (67-100%) N/A Necrotic A mount: Fat Layer (Subcutaneous Tissue): Yes Fat Layer (Subcutaneous Tissue): Yes N/A Exposed Structures: Fascia: No Fascia: No Tendon: No Tendon: No Muscle: No Muscle: No Joint: No Joint: No Bone: No Bone: No None None N/A Epithelialization: N/A Debridement - Excisional N/A Debridement: Pre-procedure Verification/Time Out N/A 16:10 N/A Taken: N/A Lidocaine 4% Topical Solution N/A Pain Control: N/A Subcutaneous, Slough N/A Tissue Debrided: N/A Skin/Subcutaneous Tissue N/A Level: N/A 6.6 N/A Debridement A (sq cm): rea N/A Curette N/A Instrument: N/A Minimum N/A Bleeding: N/A Pressure N/A Hemostasis A chieved: N/A 0 N/A Procedural Pain: N/A 0 N/A Post Procedural Pain: N/A Procedure was tolerated well N/A Debridement Treatment Response: N/A 2x3.7x0.1 N/A Post Debridement Measurements L x W x D (cm) N/A 0.581 N/A Post Debridement Volume: (cm) N/A Category/Stage III N/A Post Debridement Stage: N/A Debridement N/A Procedures Performed: Treatment Notes Electronic Signature(s) Signed: 01/20/2021 4:29:27 PM By: Jocelyn Shan DO Signed: 01/20/2021 6:14:36 PM By: Deon Pilling Entered By: Jocelyn Sanchez on 01/20/2021 16:24:49 -------------------------------------------------------------------------------- Multi-Disciplinary Care Plan Details Patient Name: Date of Service: Jocelyn Sanchez Jocelyn D. 01/20/2021 2:15 PM Medical Record Number: [id number] Patient Account Number: [id number] Date of Birth/Sex: Treating RN: 05-28-1956 (65 y.o. [name], [name] Jocelyn Care Harshil Cavallaro: Jocelyn Sanchez Other Clinician: Referring Estel Scholze: Treating Alyze Lauf/Extender:  Jocelyn Sanchez Weeks in Treatment: 66 Active Inactive Wound/Skin Impairment Nursing Diagnoses: Impaired tissue integrity Knowledge deficit related to ulceration/compromised skin integrity Goals: Patient will have a decrease in wound volume by X% from date: (specify in notes) Date Initiated: 10/19/2020 Target Resolution Date: 02/18/2021 Goal Status: Active Patient/caregiver will verbalize understanding of skin care regimen Date Initiated: 10/19/2020 Target Resolution Date: 02/19/2021 Goal Status: Active Ulcer/skin breakdown will have a volume reduction of 30% by week 4 Date Initiated: 10/19/2020 Date Inactivated: 12/09/2020 Target Resolution Date: 11/19/2020 Unmet Reason: see wound Goal Status: Unmet measurements. Ulcer/skin breakdown will have a volume reduction of 50% by week 8 Date Initiated: 10/19/2020 Date Inactivated: 12/30/2020 Target Resolution Date: 12/16/2020 Unmet Reason: see wound Goal Status: Unmet measurements. Interventions: Assess patient/caregiver ability to obtain necessary supplies Assess patient/caregiver ability to perform ulcer/skin care regimen upon admission and as needed Assess ulceration(s) every visit Provide education on ulcer and skin care Notes: Electronic Signature(s) Signed: 01/20/2021 6:14:36 PM By:  Deaton, Bobbi Entered By: Deon Pilling on 01/20/2021 14:48:46 -------------------------------------------------------------------------------- Pain Assessment Details Patient Name: Date of Service: Jocelyn Sanchez Jocelyn D. 01/20/2021 2:15 PM Medical Record Number: [id number] Patient Account Number: [id number] Date of Birth/Sex: Treating RN: 26-May-1956 (65 y.o. Jocelyn Sanchez Jocelyn Care Miachel Nardelli: Jocelyn Sanchez Other Clinician: Referring Dannisha Eckmann: Treating Kemo Spruce/Extender: Jocelyn Sanchez Weeks in Treatment: 13 Active Problems Location of Pain Severity and Description of Pain Patient Has Paino No Site  Locations Pain Management and Medication Current Pain Management: Electronic Signature(s) Signed: 01/25/2021 5:35:30 PM By: Rhae Hammock RN Entered By: Rhae Hammock on 01/20/2021 15:14:17 -------------------------------------------------------------------------------- Patient/Caregiver Education Details Patient Name: Date of Service: Jocelyn Sanchez Jocelyn D. 8/4/2022andnbsp2:15 PM Medical Record Number: [id number] Patient Account Number: [id number] Date of Birth/Gender: Treating RN: [birth year] (66 y.o. Jocelyn Sanchez Jocelyn Care Physician: Jocelyn Sanchez Other Clinician: Referring Physician: Treating Physician/Extender: Marla Roe in Treatment: 70 Education Assessment Education Provided To: Patient Education Topics Provided Wound/Skin Impairment: Handouts: Skin Care Do's and Dont's Methods: Explain/Verbal Responses: Reinforcements needed Electronic Signature(s) Signed: 01/20/2021 6:14:36 PM By: Deon Pilling Entered By: Deon Pilling on 01/20/2021 14:49:02 -------------------------------------------------------------------------------- Wound Assessment Details Patient Name: Date of Service: Jocelyn Sanchez Jocelyn D. 01/20/2021 2:15 PM Medical Record Number: [id number] Patient Account Number: [id number] Date of Birth/Sex: Treating RN: Sep 08, 1955 (65 y.o. Jocelyn Sanchez Jocelyn Care Keylan Costabile: Jocelyn Sanchez Other Clinician: Referring Zerenity Bowron: Treating Mario Voong/Extender: Jocelyn Sanchez Weeks in Treatment: 13 Wound Status Wound Number: 3 Jocelyn Pressure Ulcer Etiology: Wound Location: Sacrum Wound Open Wounding Event: Pressure Injury Status: Date Acquired: 07/20/2020 Comorbid Anemia, Deep Vein Thrombosis, Hypertension, Peripheral Weeks Of Treatment: 13 History: Venous Disease, Type II Diabetes Clustered Wound: No Photos Wound Measurements Length: (cm) 4.5 Width: (cm) 3 Depth: (cm) 2.5 Area: (cm)  10.603 Volume: (cm) 26.507 % Reduction in Area: -80% % Reduction in Volume: -200% Epithelialization: None Tunneling: No Undermining: Yes Starting Position (o'clock): 10 Ending Position (o'clock): 12 Maximum Distance: (cm) 5 Wound Description Classification: Category/Stage III Wound Margin: Well defined, not attached Exudate Amount: Medium Exudate Type: Serosanguineous Exudate Color: red, brown Foul Odor After Cleansing: No Slough/Fibrino Yes Wound Bed Granulation Amount: Small (1-33%) Exposed Structure Granulation Quality: Pink Fascia Exposed: No Necrotic Amount: Large (67-100%) Fat Layer (Subcutaneous Tissue) Exposed: Yes Necrotic Quality: Adherent Slough Tendon Exposed: No Muscle Exposed: No Joint Exposed: No Bone Exposed: No Electronic Signature(s) Signed: 01/25/2021 5:35:30 PM By: Rhae Hammock RN Entered By: Rhae Hammock on 01/20/2021 15:21:03 -------------------------------------------------------------------------------- Wound Assessment Details Patient Name: Date of Service: Jocelyn Sanchez Jocelyn D. 01/20/2021 2:15 PM Medical Record Number: [id number] Patient Account Number: [id number] Date of Birth/Sex: Treating RN: December 14, 1955 (65 y.o. Jocelyn Sanchez Jocelyn Care Tate Zagal: Jocelyn Sanchez Other Clinician: Referring Curtiss Mahmood: Treating Mariena Meares/Extender: Jocelyn Sanchez Weeks in Treatment: 13 Wound Status Wound Number: 4 Jocelyn Pressure Ulcer Etiology: Wound Location: Right Gluteus Wound Open Wounding Event: Pressure Injury Status: Date Acquired: 12/30/2020 Comorbid Anemia, Deep Vein Thrombosis, Hypertension, Peripheral Weeks Of Treatment: 3 History: Venous Disease, Type II Diabetes Clustered Wound: No Photos Wound Measurements Length: (cm) 2 Width: (cm) 3.7 Depth: (cm) 0.1 Area: (cm) 5.812 Volume: (cm) 0.581 % Reduction in Area: 30.2% % Reduction in Volume: 30.3% Epithelialization: None Tunneling: No Undermining:  No Wound Description Classification: Category/Stage III Wound Margin: Flat and Intact Exudate Amount: Medium Exudate Type: Serosanguineous Exudate Color: red, brown Foul Odor After Cleansing: No Slough/Fibrino Yes Wound Bed  Granulation Amount: Small (1-33%) Exposed Structure Granulation Quality: Pink Fascia Exposed: No Necrotic Amount: Large (67-100%) Fat Layer (Subcutaneous Tissue) Exposed: Yes Necrotic Quality: Adherent Slough Tendon Exposed: No Muscle Exposed: No Joint Exposed: No Bone Exposed: No Electronic Signature(s) Signed: 01/25/2021 5:35:30 PM By: Rhae Hammock RN Entered By: Rhae Hammock on 01/20/2021 15:22:54 -------------------------------------------------------------------------------- Vitals Details Patient Name: Date of Service: Jocelyn Sanchez Jocelyn D. 01/20/2021 2:15 PM Medical Record Number: [id number] Patient Account Number: [id number] Date of Birth/Sex: Treating RN: 02-Nov-1955 (65 y.o. Jocelyn Sanchez Jocelyn Care Lorrin Nawrot: Jocelyn Sanchez Other Clinician: Referring Rhealyn Cullen: Treating Berl Bonfanti/Extender: Jocelyn Sanchez Weeks in Treatment: 13 Vital Signs Time Taken: 15:14 Temperature (F): 97.7 Height (in): 63 Pulse (bpm): 74 Weight (lbs): 142 Respiratory Rate (breaths/min): 17 Body Mass Index (BMI): 25.2 Blood Pressure (mmHg): 92/54 Reference Range: 80 - 120 mg / dl Electronic Signature(s) Signed: 01/25/2021 5:35:30 PM By: Rhae Hammock RN Entered By: Rhae Hammock on 01/20/2021 15:14:39

## 2021-02-09 ENCOUNTER — Encounter (HOSPITAL_BASED_OUTPATIENT_CLINIC_OR_DEPARTMENT_OTHER): Payer: Medicare Other | Admitting: Internal Medicine

## 2021-02-16 ENCOUNTER — Encounter (HOSPITAL_BASED_OUTPATIENT_CLINIC_OR_DEPARTMENT_OTHER): Payer: Medicare Other | Admitting: Internal Medicine

## 2021-02-23 ENCOUNTER — Encounter (HOSPITAL_BASED_OUTPATIENT_CLINIC_OR_DEPARTMENT_OTHER): Payer: Medicare Other | Admitting: Internal Medicine

## 2021-03-17 ENCOUNTER — Other Ambulatory Visit: Payer: Self-pay

## 2021-03-17 ENCOUNTER — Encounter (HOSPITAL_BASED_OUTPATIENT_CLINIC_OR_DEPARTMENT_OTHER): Payer: Medicare Other | Attending: Internal Medicine | Admitting: Internal Medicine

## 2021-03-17 DIAGNOSIS — G35 Multiple sclerosis: Secondary | ICD-10-CM | POA: Insufficient documentation

## 2021-03-17 DIAGNOSIS — I890 Lymphedema, not elsewhere classified: Secondary | ICD-10-CM | POA: Insufficient documentation

## 2021-03-17 DIAGNOSIS — E11622 Type 2 diabetes mellitus with other skin ulcer: Secondary | ICD-10-CM | POA: Insufficient documentation

## 2021-03-17 DIAGNOSIS — Z7984 Long term (current) use of oral hypoglycemic drugs: Secondary | ICD-10-CM | POA: Insufficient documentation

## 2021-03-17 DIAGNOSIS — L89154 Pressure ulcer of sacral region, stage 4: Secondary | ICD-10-CM | POA: Insufficient documentation

## 2021-03-17 DIAGNOSIS — E1151 Type 2 diabetes mellitus with diabetic peripheral angiopathy without gangrene: Secondary | ICD-10-CM | POA: Insufficient documentation

## 2021-03-17 DIAGNOSIS — Z882 Allergy status to sulfonamides status: Secondary | ICD-10-CM | POA: Diagnosis not present

## 2021-03-18 NOTE — Progress Notes (Addendum)
Jocelyn, Sanchez ([id number]) Visit Report for 03/17/2021 HPI Details Patient Name: Date of Service: [facility] [name] 03/17/2021 1:15 PM Medical Record Number: [id number] Patient Account Number: [id number] Date of Birth/Sex: Treating RN: June 28, 1955 (65 y.o. Jocelyn Sanchez, [name] Primary Care Provider: Birdie Sanchez Other Clinician: Referring Provider: Treating Provider/Extender: Drucilla Schmidt Weeks in Treatment: 21 History of Present Illness Location: Midline sacral region Quality: Patient describes this as a soreness Severity: 2 out of 10 Duration: Greater than one year prior to presentation to the wound Sanchez Timing: Pain occurs most often with dressing changes Context: Developmental result of pressure over time to the sacral region Modifying Factors: Patient has previously had this wound packed with gauze, she has had a wound VAC, and she tells me other various wound care treatments over the past year. ssociated Signs and Symptoms: Multiple sclerosis, hypertension, diabetes mellitus type 2, lymphedema A HPI Description: 03/27/16 patient presents today on initial evaluation concerning her sacral pressure injury which has been present for roughly one year. She tells me that she does not know of any x-rays or other advanced imaging has been performed in regard to the sacral region. She has been provided with wound care at the [facility] facility at this point in time. She has MS and is not able to move she tells me "at all". She does tell me however that the facility is very good about re-positioning her at least every 2 hours. With that being said she did fell the wound VAC apparently. She had this twice the first time she tells me that it did not seem to be working very well and the second time it actually made things worse this was just 2 months ago. Therefore this was discontinued. at this point in time today she is not having any signs or symptoms of systemic  infection she is on a blood thinner which is Eliquis at this point in time. 03/24/16; patient admitted to our clinic by [facility] last week. She is a lady with advanced MS who is had a wound on her lower sacral area for most of the last year. She reiterates to me that wound vacs have not worked for her in fact she states they've actually made her wound worse on the last occasion. An x- ray was apparently done in the facility although I don't have these results. 03/31/16; this patient had an x-ray done at the facility that did not show evidence of osteomyelitis. Her bone biopsy did not show osteomyelitis. 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 She had been suggested to have collagen on the base of the wound covered with normal saline wet to dry although her intake nurse reports there was no collagen on the wound surface. 05/15/16 wound is not changed that much. This is a substantial stage IV wound with considerable undermining from 7 to 3:00. I have spoken to the wound care nurse at the [facility] facility, there was no records on what exactly caused the wound VAC to fail or records of the same. 06/05/16; I received communication from [facility] reporting that the patient refuses the wound back more often that it's on. When I confronted her with this today in the room she denied that she was responsible stating for the most part the  problem is because she has trouble getting them to deal with her Foley catheter properly. I don't exactly follow her line of reasoning here however apparently they've been putting hydrogel wet to dry and in the facility 07/03/16; I have not heard anything from [facility]. The patient states she has not had a wound VAC the last 2-3 days due to "battery problems".  She arrives today with wet to dry dressing. She has advanced MS and is a chronic care resident at the skilled facility 07/31/16; patient is a [facility] of [facility]. She has had the VAC applied but did not arrive with the [facility] in place. Her wound generally looks better perhaps somewhat less tunneling superiorly. There is still some exposed bone however. 09/11/16; I follow this patient with advanced MS who is a resident of [facility] on a monthly basis. She arrives 6 weeks after her last visit. The wound VAC apparently is no longer being used according to the patient because the nurses in the facility could not maintain a seal in this area. Via the patient it seems that they're using some form of wet to dry dressing although he received no documentation. The patient states she is eating well she is being turned in the facility. 10/09/16; I follow this patient with advanced MS who is a resident of [facility]. She apparently could not have a wound VAC is the facility could not maintain a seal therefore they're using wet-to-dry dressings however the patient arrives today with better measurements and a nicely granulated wound bed. 11/05/16; patient with advanced MS was a pressure area on the lower sacrum/coccyx. She could not have a wound VAC because the facility could n/ot maintain a seal. I ordered Prisma last time. Wound dimensions are about the same per intake 12/04/16; pressure area on the lower sacrum. using collagen. Has a new concern on the right great toe 01/01/17; patient has now only a small open area on the lower sacrumcoccyx she is been doing well and this wound is gradually progressing towards closure. The area on the right great toe that looked to me like a paronychia last time is also quite a bit better although the nail here looks as though it's loosening READMISSION 10/19/2020 This is a patient with advanced multiple sclerosis. We had her in clinic for a  prolonged period in late 2017 to the summer 2018 with a stage IV wound on her lower sacrum/coccyx. I do not think we healed her out although her wound was a lot better. She was a resident at [facility] skilled facility at the time The patient states the wound reopened about 3 or 4 months ago. I am not completely certain from talking to the patient if this is in the same position as last time or even indeed if it ever closed. 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 She has a home health aide who comes twice a day. She had an air mattress but apparently it does not work As far as I can tell there is not been any major change in her medical problem list except she lives at home now. She is a diabetic on metformin 6/23; patient has not been here in about 6-7 weeks. I am not sure what the issue is and the patient does not seem to really know. She takes scat for transportation although that has not  been a problem before. We changed her dressing last time she was here to silver collagen moistened with the backing wet- to-dry and a foam border. Her wound is quite a bit better looking. 7/14; 3-week follow-up. The patient's wound is not as good as last time. It is wider I think more undermining superiorly. Also there appears to be superficial skin breakdown superiorly and the skin around the wound and also from about 2-4 o'clock. Not a particularly viable surface. We have been using silver collagen and backing wet-to-dry. The patient lives in her own home. She apparently has care attendance for almost all of the day. She has a surface on her bed but this is clearly not a level 3. She also has home health. She claims to be eating well 8/4; patient presents for 3-week follow-up. She has no issues or complaints today. She reports using wet-to-dry dressings for her sacral wound and collagen to the  buttocks wound. She states that her new mattress is arriving today 9/29; its been almost 2 months since the patient was in the clinic. She has a sacral wound which is worse I think with exposed bone to the right under the undermining area. Also concerning is a marked deterioration of her left buttock. With a superficial necrotic area and several small superficial areas. She has been using silver collagen wet-to-dry. The patient has a level 2 surface certainly would benefit from a level 3 if that can be provided. She is relatively immobile cannot turn her self secondary to multiple sclerosis. She tells me she has an aide for most of the day except for 4 hours. She also states she is not up much in a wheelchair. I wonder about her oral intake although she says she eats 3 meals a day and takes Engineer, structural) Signed: 03/18/2021 3:46:44 PM By: Linton Ham MD Entered By: Linton Ham on 03/17/2021 14:28:46 -------------------------------------------------------------------------------- Physical Exam Details Patient Name: Date of Service: [facility] [name] 03/17/2021 1:15 PM Medical Record Number: [id number] Patient Account Number: [id number] Date of Birth/Sex: Treating RN: 1956/04/24 (65 y.o. [name] Primary Care Provider: Birdie Sanchez Other Clinician: Referring Provider: Treating Provider/Extender: Drucilla Schmidt Weeks in Treatment: 21 Constitutional Patient is hypotensive. However she does not appear acutely unwell. Pulse regular and within target range for patient.Marland Kitchen Respirations regular, non-labored and within target range.. Temperature is normal and within the target range for the patient.Marland Kitchen Appears in no distress however exceptionally frail. Notes Wound exam; sacral wound has palpable bone under the undermining area on the right at about 2-4 o'clock. Deep granulation over the rest of this. She complains of a lot of pain. On the right  buttock there is a necrotic area superficial is areas around thiso Soft tissue crepitus and significant tenderness Electronic Signature(s) Signed: 03/18/2021 3:46:44 PM By: Linton Ham MD Entered By: Linton Ham on 03/17/2021 14:29:55 -------------------------------------------------------------------------------- Physician Orders Details Patient Name: Date of Service: [facility] [name] 03/17/2021 1:15 PM Medical Record Number: [id number] Patient Account Number: [id number] Date of Birth/Sex: Treating RN: [birth year] (65 y.o. [name] Primary Care Provider: Birdie Sanchez Other Clinician: Referring Provider: Treating Provider/Extender: Drucilla Schmidt Weeks in Treatment: 21 Verbal / Phone Orders: No Diagnosis Coding ICD-10 Coding Code Description L89.154 Pressure ulcer of sacral region, stage 4 G35 Multiple sclerosis Follow-up Appointments ppointment in 2 weeks. - Dr. Dellia Nims ***60 minutes extra time hoyer**** Return A Bathing/ Shower/ Hygiene May shower with  protection but do not get wound dressing(s) wet. - May shower and use soap and water on days dressings are changed Off-Loading Low air-loss mattress (Group 2) A fluidized (Group 3) mattress ir Turn and reposition every 2 hours [place] wound care orders this week; continue Home Health for wound care. May utilize formulary equivalent dressing for wound treatment orders unless otherwise specified. - change dressing to silver alginate Dressing changes to be completed by [place] on Monday / Wednesday / Friday except when patient has scheduled visit at [facility]. Other Home Health Orders/Instructions: - [facility] Wound Treatment Wound #3 - Sacrum Cleanser: Soap and Water [facility]) Every Other Day/30 Days Discharge Instructions: May shower and wash wound with dial antibacterial soap and water prior to dressing change. Cleanser: Wound Cleanser [facility]) Every Other Day/30  Days Discharge Instructions: Cleanse the wound with wound cleanser prior to applying a clean dressing using gauze sponges, not tissue or cotton balls. Peri-Wound Care: Skin Prep [facility]) Every Other Day/30 Days Discharge Instructions: Use skin prep as directed Prim Dressing: KerraCel Ag Gelling Fiber Dressing, 4x5 in (silver alginate) (Home Health) Every Other Day/30 Days ary Discharge Instructions: Apply silver alginate to wound bed as instructed Prim Dressing: Pack with saline moistened gauze on top of prisma ([place]) Every Other Day/30 Days ary Secondary Dressing: Woven Gauze Sponge, Non-Sterile 4x4 in ([place]) Every Other Day/30 Days Discharge Instructions: Apply over primary dressing as directed. Secondary Dressing: MPM Excel SAP Bordered Dressing, 7x6.7 (Sacral) (in/in) (Home Health) Every Other Day/30 Days Discharge Instructions: Apply silicone border over primary dressing as directed. Wound #4 - Gluteus Wound Laterality: Right Cleanser: Soap and Water [facility]) Every Other Day/30 Days Discharge Instructions: May shower and wash wound with dial antibacterial soap and water prior to dressing change. Cleanser: Wound Cleanser [facility]) Every Other Day/30 Days Discharge Instructions: Cleanse the wound with wound cleanser prior to applying a clean dressing using gauze sponges, not tissue or cotton balls. Peri-Wound Care: Skin Prep [facility]) Every Other Day/30 Days Discharge Instructions: Use skin prep as directed Prim Dressing: KerraCel Ag Gelling Fiber Dressing, 4x5 in (silver alginate) Every Other Day/30 Days ary Discharge Instructions: Apply silver alginate to wound bed as instructed Secondary Dressing: Woven Gauze Sponge, Non-Sterile 4x4 in [facility]) Every Other Day/30 Days Discharge Instructions: Apply over primary dressing as directed. Secondary Dressing: MPM Excel SAP Bordered Dressing, 7x6.7 (Sacral) (in/in) (Home Health) Every Other Day/30  Days Discharge Instructions: Apply silicone border over primary dressing as directed. Radiology X-ray, other pelvis and sacrum - stage 4 pressure ulcer to sacrum, r/o osteomyelitis (may be done portable by home health) - (ICD10 L89.154 - Pressure ulcer of sacral region, stage 4) Patient Medications llergies: Sulfa (Sulfonamide Antibiotics) A Notifications Medication Indication Start End wound infection 03/17/2021 Augmentin DOSE oral 875 mg-125 mg tablet - 1 tablet oral bid for 7 days Electronic Signature(s) Signed: 03/17/2021 2:33:28 PM By: Linton Ham MD Entered By: Linton Ham on 03/17/2021 14:33:28 Prescription 03/17/2021 -------------------------------------------------------------------------------- Basilia Jumbo D. Linton Ham MD Patient Name: Provider: 1955-09-23 [id number] Date of Birth: NPI#: [id number] Sex: DEA #: [phone] [id number] Phone #: License #: [place] Patient Address: [address] [place], [place] [address] [place], [place] [address] [phone] Allergies Sulfa (Sulfonamide Antibiotics) Provider's Orders X-ray, other pelvis and sacrum - ICD10: L89.154 - stage 4 pressure ulcer to sacrum, r/o osteomyelitis (may be done portable by home health)  Hand Signature: Date(s): Electronic Signature(s) Signed: 03/18/2021 3:46:44 PM By: Linton Ham MD Entered By: Linton Ham on 03/17/2021 14:33:29 -------------------------------------------------------------------------------- Problem List Details Patient Name: Date of Service: [facility] [name] 03/17/2021 1:15 PM Medical Record Number: [id number] Patient Account Number: [id number] Date of Birth/Sex: Treating RN: 16-Apr-1956 (65 y.o. [name] Primary Care Provider: Birdie Sanchez Other Clinician: Referring Provider: Treating Provider/Extender: Drucilla Schmidt Weeks in Treatment: 21 Active  Problems ICD-10 Encounter Code Description Active Date MDM Diagnosis L89.154 Pressure ulcer of sacral region, stage 4 10/19/2020 No Yes G35 Multiple sclerosis 10/19/2020 No Yes L89.319 Pressure ulcer of right buttock, unspecified stage 03/17/2021 No Yes L03.818 Cellulitis of other sites 03/17/2021 No Yes Inactive Problems Resolved Problems Electronic Signature(s) Signed: 03/18/2021 3:46:44 PM By: Linton Ham MD Entered By: Linton Ham on 03/17/2021 14:26:18 -------------------------------------------------------------------------------- Progress Note Details Patient Name: Date of Service: [facility] [name] 03/17/2021 1:15 PM Medical Record Number: [id number] Patient Account Number: [id number] Date of Birth/Sex: Treating RN: [birth year] (65 y.o. [facility] Sanchez, [name] Primary Care Provider: Birdie Sanchez Other Clinician: Referring Provider: Treating Provider/Extender: Drucilla Schmidt Weeks in Treatment: 21 Subjective History of Present Illness (HPI) The following HPI elements were documented for the patient's wound: Location: Midline sacral region Quality: Patient describes this as a soreness Severity: 2 out of 10 Duration: Greater than one year prior to presentation to the wound Sanchez Timing: Pain occurs most often with dressing changes Context: Developmental result of pressure over time to the sacral region Modifying Factors: Patient has previously had this wound packed with gauze, she has had a wound VAC, and she tells me other various wound care treatments over the past year. Associated Signs and Symptoms: Multiple sclerosis, hypertension, diabetes mellitus type 2, lymphedema 03/27/16 patient presents today on initial evaluation concerning her sacral pressure injury which has been present for roughly one year. She tells me that she does not know of any x-rays or other advanced imaging has been performed in regard to the sacral region. She has been provided with  wound care at the [facility] facility at this point in time. She has MS and is not able to move she tells me "at all". She does tell me however that the facility is very good about re-positioning her at least every 2 hours. With that being said she did fell the wound VAC apparently. She had this twice the first time she tells me that it did not seem to be working very well and the second time it actually made things worse this was just 2 months ago. Therefore this was discontinued. at this point in time today she is not having any signs or symptoms of systemic infection she is on a blood thinner which is Eliquis at this point in time. 03/24/16; patient admitted to our clinic by [facility] last week. She is a lady with advanced MS who is had a wound on her lower sacral area for most of the last year. She reiterates to me that wound vacs have not worked for her in fact she states they've actually made her wound worse on the last occasion. An x- ray was apparently done in the facility although I don't have these results. 03/31/16; this patient had an x-ray done at the facility that did not show evidence of osteomyelitis. Her bone biopsy did not show osteomyelitis. 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 We  are using collagen moist gauze border foam change every second day 05/01/16; substantial stage IV wound in a patient with advanced MS. She had been suggested to have collagen on the base of the wound covered with normal saline wet to dry although her intake nurse reports there was no collagen on the wound surface. 05/15/16 wound is not changed that much. This is a substantial stage IV wound with considerable undermining from 7 to 3:00. I have spoken to the wound care nurse at the [name] facility, there was no records on what exactly caused the wound VAC to fail or records of the same. 06/05/16; I received communication from  [facility] skilled facility reporting that the patient refuses the wound back more often that it's on. When I confronted her with this today in the room she denied that she was responsible stating for the most part the problem is because she has trouble getting them to deal with her Foley catheter properly. I don't exactly follow her line of reasoning here however apparently they've been putting hydrogel wet to dry and in the facility 07/03/16; I have not heard anything from [facility]. The patient states she has not had a wound VAC the last 2-3 days due to "battery problems". She arrives today with wet to dry dressing. She has advanced MS and is a chronic care resident at the skilled facility 07/31/16; patient is a resident of [facility] skilled facility. She has had the VAC applied but did not arrive with the [facility] in place. Her wound generally looks better perhaps somewhat less tunneling superiorly. There is still some exposed bone however. 09/11/16; I follow this patient with advanced MS who is a resident of [facility] skilled facility on a monthly basis. She arrives 6 weeks after her last visit. The wound VAC apparently is no longer being used according to the patient because the nurses in the facility could not maintain a seal in this area. Via the patient it seems that they're using some form of wet to dry dressing although he received no documentation. The patient states she is eating well she is being turned in the facility. 10/09/16; I follow this patient with advanced MS who is a resident of [facility]. She apparently could not have a wound VAC is the facility could not maintain a seal therefore they're using wet-to-dry dressings however the patient arrives today with better measurements and a nicely granulated wound bed. 11/05/16; patient with advanced MS was a pressure area on the lower sacrum/coccyx. She could not have a wound VAC because the facility could n/ot maintain a seal. I ordered  Prisma last time. Wound dimensions are about the same per intake 12/04/16; pressure area on the lower sacrum. using collagen. Has a new concern on the right great toe 01/01/17; patient has now only a small open area on the lower sacrumoococcyx she is been doing well and this wound is gradually progressing towards closure. The area on the right great toe that looked to me like a paronychia last time is also quite a bit better although the nail here looks as though it's loosening READMISSION 10/19/2020 This is a patient with advanced multiple sclerosis. We had her in clinic for a prolonged period in late 2017 to the summer 2018 with a stage IV wound on her lower sacrum/coccyx. I do not think we healed her out although her wound was a lot better. She was a resident at [facility] skilled facility at the time The patient states the wound reopened about 3  or 4 months ago. I am not completely certain from talking to the patient if this is in the same position as last time or even indeed if it ever closed. 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 She has a home health aide who comes twice a day. She had an air mattress but apparently it does not work As far as I can tell there is not been any major change in her medical problem list except she lives at home now. She is a diabetic on metformin 6/23; patient has not been here in about 6-7 weeks. I am not sure what the issue is and the patient does not seem to really know. She takes scat for transportation although that has not been a problem before. We changed her dressing last time she was here to silver collagen moistened with the backing wet- to-dry and a foam border. Her wound is quite a bit better looking. 7/14; 3-week follow-up. The patient's wound is not as good as last time. It is wider I think more undermining superiorly. Also there appears to  be superficial skin breakdown superiorly and the skin around the wound and also from about 2-4 o'clock. Not a particularly viable surface. We have been using silver collagen and backing wet-to-dry. The patient lives in her own home. She apparently has care attendance for almost all of the day. She has a surface on her bed but this is clearly not a level 3. She also has home health. She claims to be eating well 8/4; patient presents for 3-week follow-up. She has no issues or complaints today. She reports using wet-to-dry dressings for her sacral wound and collagen to the buttocks wound. She states that her new mattress is arriving today 9/29; its been almost 2 months since the patient was in the clinic. She has a sacral wound which is worse I think with exposed bone to the right under the undermining area. Also concerning is a marked deterioration of her left buttock. With a superficial necrotic area and several small superficial areas. She has been using silver collagen wet-to-dry. The patient has a level 2 surface certainly would benefit from a level 3 if that can be provided. She is relatively immobile cannot turn her self secondary to multiple sclerosis. She tells me she has an aide for most of the day except for 4 hours. She also states she is not up much in a wheelchair. I wonder about her oral intake although she says she eats 3 meals a day and takes Glucerna Objective Constitutional Patient is hypotensive. However she does not appear acutely unwell. Pulse regular and within target range for patient.Marland Kitchen Respirations regular, non-labored and within target range.. Temperature is normal and within the target range for the patient.Marland Kitchen Appears in no distress however exceptionally frail. Vitals Time Taken: 1:36 PM, Height: 63 in, Source: Stated, Weight: 142 lbs, Source: Stated, BMI: 25.2, Temperature: 97.8 F, Pulse: 95 bpm, Respiratory Rate: 18 breaths/min, Blood Pressure: 92/60 mmHg. General Notes:  Wound exam; sacral wound has palpable bone under the undermining area on the right at about 2-4 o'clock. Deep granulation over the rest of this. She complains of a lot of pain. On the right buttock there is a necrotic area superficial is areas around thiso Soft tissue crepitus and significant tenderness Integumentary (Hair, Skin) Wound #3 status is Open. Original cause of wound  was Pressure Injury. The date acquired was: 07/20/2020. The wound has been in treatment 21 weeks. The wound is located on the Sacrum. The wound measures 2.5cm length x 3.4cm width x 1.8cm depth; 6.676cm^2 area and 12.017cm^3 volume. There is Fat Layer (Subcutaneous Tissue) exposed. There is no tunneling noted, however, there is undermining starting at 11:00 and ending at 2:00 with a maximum distance of 3.4cm. There is a large amount of serosanguineous drainage noted. The wound margin is well defined and not attached to the wound base. There is large (67- 100%) red, pink granulation within the wound bed. There is a small (1-33%) amount of necrotic tissue within the wound bed including Adherent Slough. Wound #4 status is Open. Original cause of wound was Pressure Injury. The date acquired was: 12/30/2020. The wound has been in treatment 11 weeks. The wound is located on the Right Gluteus. The wound measures 6.8cm length x 8.5cm width x 0.1cm depth; 45.396cm^2 area and 4.54cm^3 volume. There is Fat Layer (Subcutaneous Tissue) exposed. There is no tunneling or undermining noted. There is a large amount of serosanguineous drainage noted. The wound margin is flat and intact. There is medium (34-66%) pink granulation within the wound bed. There is a medium (34-66%) amount of necrotic tissue within the wound bed including Adherent Slough. Assessment Active Problems ICD-10 Pressure ulcer of sacral region, stage 4 Multiple sclerosis Pressure ulcer of right buttock, unspecified stage Cellulitis of other sites Plan Follow-up  Appointments: Return Appointment in 2 weeks. - Dr. Dellia Nims ***60 minutes extra time hoyer**** Bathing/ Shower/ Hygiene: May shower with protection but do not get wound dressing(s) wet. - May shower and use soap and water on days dressings are changed Off-Loading: Low air-loss mattress (Group 2) Air fluidized (Group 3) mattress Turn and reposition every 2 hours Home Health: New wound care orders this week; continue Home Health for wound care. May utilize formulary equivalent dressing for wound treatment orders unless otherwise specified. - change dressing to silver alginate Dressing changes to be completed by [facility] on Monday / Wednesday / Friday except when patient has scheduled visit at [facility]. Other Home Health Orders/Instructions: - [facility] Radiology ordered were: X-ray, other pelvis and sacrum - stage 4 pressure ulcer to sacrum, r/o osteomyelitis (may be done portable by home health) WOUND #3: - Sacrum Wound Laterality: Cleanser: Soap and Water [facility]) Every Other Day/30 Days Discharge Instructions: May shower and wash wound with dial antibacterial soap and water prior to dressing change. Cleanser: Wound Cleanser [facility]) Every Other Day/30 Days Discharge Instructions: Cleanse the wound with wound cleanser prior to applying a clean dressing using gauze sponges, not tissue or cotton balls. Peri-Wound Care: Skin Prep [facility]) Every Other Day/30 Days Discharge Instructions: Use skin prep as directed Prim Dressing: KerraCel Ag Gelling Fiber Dressing, 4x5 in (silver alginate) (Home Health) Every Other Day/30 Days ary Discharge Instructions: Apply silver alginate to wound bed as instructed Prim Dressing: Pack with saline moistened gauze on top of prisma ([place]) Every Other Day/30 Days ary Secondary Dressing: Woven Gauze Sponge, Non-Sterile 4x4 in ([place]) Every Other Day/30 Days Discharge Instructions: Apply over primary dressing as  directed. Secondary Dressing: MPM Excel SAP Bordered Dressing, 7x6.7 (Sacral) (in/in) (Home Health) Every Other Day/30 Days Discharge Instructions: Apply silicone border over primary dressing as directed. WOUND #4: - Gluteus Wound Laterality: Right Cleanser: Soap and Water [facility]) Every Other Day/30 Days Discharge Instructions: May shower and wash wound with dial antibacterial soap and water  prior to dressing change. Cleanser: Wound Cleanser [facility]) Every Other Day/30 Days Discharge Instructions: Cleanse the wound with wound cleanser prior to applying a clean dressing using gauze sponges, not tissue or cotton balls. Peri-Wound Care: Skin Prep [facility]) Every Other Day/30 Days Discharge Instructions: Use skin prep as directed Prim Dressing: KerraCel Ag Gelling Fiber Dressing, 4x5 in (silver alginate) Every Other Day/30 Days ary Discharge Instructions: Apply silver alginate to wound bed as instructed Secondary Dressing: Woven Gauze Sponge, Non-Sterile 4x4 in [facility]) Every Other Day/30 Days Discharge Instructions: Apply over primary dressing as directed. Secondary Dressing: MPM Excel SAP Bordered Dressing, 7x6.7 (Sacral) (in/in) (Home Health) Every Other Day/30 Days Discharge Instructions: Apply silicone border over primary dressing as directed. 1. I am going to empirically put her on Augmentin 2. I would like this area x-rayed. Look at the bone under the wound on the lower sacrum/coccyx also look at the soft tissue on her buttock for gas. 3. I wonder about the patient's nutritional intake. She did not appear to be dehydrated. She appears very frail 4. We will order her a level 3 pressure relief surface 5.'s changed to silver alginate on all of this 6. I like to see her back in a week Electronic Signature(s) Signed: 03/17/2021 2:34:14 PM By: Linton Ham MD Entered By: Linton Ham on 03/17/2021  14:34:13 -------------------------------------------------------------------------------- SuperBill Details Patient Name: Date of Service: Jocelyn [facility] [name] 03/17/2021 Medical Record Number: [id number] Patient Account Number: [id number] Date of Birth/Sex: Treating RN: 1956-05-13 (65 y.o. Jocelyn Sanchez, [name] Primary Care Provider: Birdie Sanchez Other Clinician: Referring Provider: Treating Provider/Extender: Drucilla Schmidt Weeks in Treatment: 21 Diagnosis Coding ICD-10 Codes Code Description L89.154 Pressure ulcer of sacral region, stage 4 G35 Multiple sclerosis L89.319 Pressure ulcer of right buttock, unspecified stage L03.818 Cellulitis of other sites Facility Procedures CPT4 Code: [id number] Description: 99214 - WOUND CARE VISIT-LEV 4 EST PT Modifier: Quantity: 1 Physician Procedures : CPT4 Code Description Modifier [id number] - WC PHYS LEVEL 4 - EST PT ICD-10 Diagnosis Description L89.154 Pressure ulcer of sacral region, stage 4 L89.319 Pressure ulcer of right buttock, unspecified stage L03.818 Cellulitis of other sites G35  Multiple sclerosis Quantity: 1 Electronic Signature(s) Signed: 03/21/2021 12:52:08 PM By: Baruch Gouty RN, BSN Signed: 03/23/2021 10:14:24 AM By: Linton Ham MD Previous Signature: 03/18/2021 3:46:44 PM Version By: Linton Ham MD Entered By: Baruch Gouty on 03/21/2021 11:53:30

## 2021-03-18 NOTE — Progress Notes (Addendum)
GULIANA, WEYANDT ([id number]) Visit Report for 03/17/2021 Arrival Information Details Patient Name: Date of Service: Reinaldo Meeker [name] 03/17/2021 1:15 PM Medical Record Number: [id number] Patient Account Number: [id number] Date of Birth/Sex: Treating RN: 05/10/1956 (65 y.o. [name] Primary Care Aften Lipsey: [name] Other Clinician: Referring Gretchen Weinfeld: Treating Gamaliel Charney/Extender: Drucilla Schmidt Weeks in Treatment: 21 Visit Information History Since Last Visit Added or deleted any medications: No Patient Arrived: Wheel Chair Any new allergies or adverse reactions: No Arrival Time: 13:32 Had a fall or experienced change in No Accompanied By: caregiver activities of daily living that may affect Transfer Assistance: Harrel Lemon Lift risk of falls: Patient Identification Verified: Yes Signs or symptoms of abuse/neglect since last visito No Secondary Verification Process Completed: Yes Hospitalized since last visit: No Patient Requires Transmission-Based Precautions: No Implantable device outside of the clinic excluding No Patient Has Alerts: Yes cellular tissue based products placed in the center Patient Alerts: Patient on Blood Thinner since last visit: Has Dressing in Place as Prescribed: Yes Pain Present Now: Yes Electronic Signature(s) Signed: 03/17/2021 6:01:32 PM By: Baruch Gouty RN, BSN Entered By: Baruch Gouty on 03/17/2021 13:36:05 -------------------------------------------------------------------------------- Clinic Level of Care Assessment Details Patient Name: Date of Service: Reinaldo Meeker [name] 03/17/2021 1:15 PM Medical Record Number: [id number] Patient Account Number: [id number] Date of Birth/Sex: Treating RN: 1956-04-23 (65 y.o. Martyn Malay, Linda Primary Care Havoc Sanluis: [name] Other Clinician: Referring Waneda Klammer: Treating Earlin Sweeden/Extender: Alycia Rossetti in Treatment: 21 Clinic Level of Care  Assessment Items TOOL 4 Quantity Score []  - 0 Use when only an EandM is performed on FOLLOW-UP visit ASSESSMENTS - Nursing Assessment / Reassessment X- 1 10 Reassessment of Co-morbidities (includes updates in patient status) X- 1 5 Reassessment of Adherence to Treatment Plan ASSESSMENTS - Wound and Skin A ssessment / Reassessment []  - 0 Simple Wound Assessment / Reassessment - one wound X- 2 5 Complex Wound Assessment / Reassessment - multiple wounds []  - 0 Dermatologic / Skin Assessment (not related to wound area) ASSESSMENTS - Focused Assessment []  - 0 Circumferential Edema Measurements - multi extremities []  - 0 Nutritional Assessment / Counseling / Intervention []  - 0 Lower Extremity Assessment (monofilament, tuning fork, pulses) []  - 0 Peripheral Arterial Disease Assessment (using hand held doppler) ASSESSMENTS - Ostomy and/or Continence Assessment and Care []  - 0 Incontinence Assessment and Management []  - 0 Ostomy Care Assessment and Management (repouching, etc.) PROCESS - Coordination of Care X - Simple Patient / Family Education for ongoing care 1 15 []  - 0 Complex (extensive) Patient / Family Education for ongoing care X- 1 10 Staff obtains Programmer, systems, Records, T Results / Process Orders est X- 1 10 Staff telephones HHA, Nursing Homes / Clarify orders / etc []  - 0 Routine Transfer to another Facility (non-emergent condition) []  - 0 Routine Hospital Admission (non-emergent condition) []  - 0 New Admissions / Biomedical engineer / Ordering NPWT Apligraf, etc. , []  - 0 Emergency Hospital Admission (emergent condition) X- 1 10 Simple Discharge Coordination []  - 0 Complex (extensive) Discharge Coordination PROCESS - Special Needs []  - 0 Pediatric / Minor Patient Management []  - 0 Isolation Patient Management []  - 0 Hearing / Language / Visual special needs []  - 0 Assessment of Community assistance (transportation, D/C planning, etc.) []  -  0 Additional assistance / Altered mentation []  - 0 Support Surface(s) Assessment (bed, cushion, seat, etc.) INTERVENTIONS - Wound Cleansing / Measurement []  - 0 Simple  Wound Cleansing - one wound X- 2 5 Complex Wound Cleansing - multiple wounds X- 1 5 Wound Imaging (photographs - any number of wounds) []  - 0 Wound Tracing (instead of photographs) []  - 0 Simple Wound Measurement - one wound X- 2 5 Complex Wound Measurement - multiple wounds INTERVENTIONS - Wound Dressings X - Small Wound Dressing one or multiple wounds 2 10 []  - 0 Medium Wound Dressing one or multiple wounds []  - 0 Large Wound Dressing one or multiple wounds X- 1 5 Application of Medications - topical []  - 0 Application of Medications - injection INTERVENTIONS - Miscellaneous []  - 0 External ear exam []  - 0 Specimen Collection (cultures, biopsies, blood, body fluids, etc.) []  - 0 Specimen(s) / Culture(s) sent or taken to Lab for analysis X- 1 10 Patient Transfer (multiple staff / Civil Service fast streamer / Similar devices) []  - 0 Simple Staple / Suture removal (25 or less) []  - 0 Complex Staple / Suture removal (26 or more) []  - 0 Hypo / Hyperglycemic Management (close monitor of Blood Glucose) []  - 0 Ankle / Brachial Index (ABI) - do not check if billed separately X- 1 5 Vital Signs Has the patient been seen at the hospital within the last three years: Yes Total Score: 135 Level Of Care: New/Established - Level 4 Electronic Signature(s) Signed: 03/21/2021 12:52:08 PM By: Baruch Gouty RN, BSN Entered By: Baruch Gouty on 03/21/2021 11:52:28 -------------------------------------------------------------------------------- Lower Extremity Assessment Details Patient Name: Date of Service: Reinaldo Meeker [name] 03/17/2021 1:15 PM Medical Record Number: [id number] Patient Account Number: [id number] Date of Birth/Sex: Treating RN: [birth year] (65 y.o. [name] Primary Care Taison Celani: [name] Other  Clinician: Referring Stephane Junkins: Treating Jakyiah Briones/Extender: Drucilla Schmidt Weeks in Treatment: 21 Electronic Signature(s) Signed: 03/17/2021 6:01:32 PM By: Baruch Gouty RN, BSN Entered By: Baruch Gouty on 03/17/2021 13:38:02 -------------------------------------------------------------------------------- Multi Wound Chart Details Patient Name: Date of Service: Reinaldo Meeker [name] 03/17/2021 1:15 PM Medical Record Number: [id number] Patient Account Number: [id number] Date of Birth/Sex: Treating RN: Dec 21, 1955 (65 y.o. [name], [name] Primary Care Kevontae Burgoon: [name] Other Clinician: Referring Loye Reininger: Treating Nadiyah Zeis/Extender: Drucilla Schmidt Weeks in Treatment: 21 Vital Signs Height(in): 51 Pulse(bpm): 95 Weight(lbs): 142 Blood Pressure(mmHg): 92/60 Body Mass Index(BMI): 25 Temperature(F): 97.8 Respiratory Rate(breaths/min): 18 Photos: [N/A:N/A] Sacrum Right Gluteus N/A Wound Location: Pressure Injury Pressure Injury N/A Wounding Event: Pressure Ulcer Pressure Ulcer N/A Primary Etiology: Anemia, Deep Vein Thrombosis, Anemia, Deep Vein Thrombosis, N/A Comorbid History: Hypertension, Peripheral Venous Hypertension, Peripheral Venous Disease, Type II Diabetes Disease, Type II Diabetes 07/20/2020 12/30/2020 N/A Date Acquired: 21 11 N/A Weeks of Treatment: Open Open N/A Wound Status: No Yes N/A Clustered Wound: N/A 5 N/A Clustered Quantity: 2.5x3.4x1.8 6.8x8.5x0.1 N/A Measurements L x W x D (cm) 6.676 45.396 N/A A (cm) : rea 12.017 4.54 N/A Volume (cm) : -13.30% -445.30% N/A % Reduction in A rea: -36.00% -445.00% N/A % Reduction in Volume: 11 Starting Position 1 (o'clock): 2 Ending Position 1 (o'clock): 3.4 Maximum Distance 1 (cm): Yes No N/A Undermining: Category/Stage III Category/Stage III N/A Classification: Large Large N/A Exudate A mount: Serosanguineous Serosanguineous N/A Exudate Type: red,  brown red, brown N/A Exudate Color: Well defined, not attached Flat and Intact N/A Wound Margin: Large (67-100%) Medium (34-66%) N/A Granulation A mount: Red, Pink Pink N/A Granulation Quality: Small (1-33%) Medium (34-66%) N/A Necrotic A mount: Fat Layer (Subcutaneous Tissue): Yes Fat Layer (Subcutaneous Tissue): Yes N/A Exposed  Structures: Fascia: No Fascia: No Tendon: No Tendon: No Muscle: No Muscle: No Joint: No Joint: No Bone: No Bone: No None Small (1-33%) N/A Epithelialization: Treatment Notes Electronic Signature(s) Signed: 03/17/2021 2:50:47 PM By: Deon Pilling RN, BSN Signed: 03/18/2021 3:46:44 PM By: Linton Ham MD Entered By: Linton Ham on 03/17/2021 14:26:37 -------------------------------------------------------------------------------- Multi-Disciplinary Care Plan Details Patient Name: Date of Service: Reinaldo Meeker [name] 03/17/2021 1:15 PM Medical Record Number: [id number] Patient Account Number: [id number] Date of Birth/Sex: Treating RN: [birth year] (65 y.o. Martyn Malay, Linda Primary Care Ethie Curless: [name] Other Clinician: Referring Gwyneth Fernandez: Treating Rashonda Warrior/Extender: Alycia Rossetti in Treatment: 21 Multidisciplinary Care Plan reviewed with physician Active Inactive Pressure Nursing Diagnoses: Knowledge deficit related to causes and risk factors for pressure ulcer development Knowledge deficit related to management of pressures ulcers Potential for impaired tissue integrity related to pressure, friction, moisture, and shear Goals: Patient/caregiver will verbalize understanding of pressure ulcer management Date Initiated: 03/17/2021 Target Resolution Date: 04/15/2021 Goal Status: Active Interventions: Assess: immobility, friction, shearing, incontinence upon admission and as needed Assess offloading mechanisms upon admission and as needed Assess potential for pressure ulcer upon admission and as  needed Provide education on pressure ulcers Treatment Activities: Pressure reduction/relief device ordered : 03/17/2021 Notes: Wound/Skin Impairment Nursing Diagnoses: Impaired tissue integrity Knowledge deficit related to ulceration/compromised skin integrity Goals: Patient will have a decrease in wound volume by X% from date: (specify in notes) Date Initiated: 10/19/2020 Date Inactivated: 03/17/2021 Target Resolution Date: 02/18/2021 Goal Status: Unmet Unmet Reason: glut wound deteriorating Patient/caregiver will verbalize understanding of skin care regimen Date Initiated: 10/19/2020 Target Resolution Date: 04/15/2021 Goal Status: Active Ulcer/skin breakdown will have a volume reduction of 30% by week 4 Date Initiated: 10/19/2020 Date Inactivated: 12/09/2020 Target Resolution Date: 11/19/2020 Unmet Reason: see wound Goal Status: Unmet measurements. Ulcer/skin breakdown will have a volume reduction of 50% by week 8 Date Initiated: 10/19/2020 Date Inactivated: 12/30/2020 Target Resolution Date: 12/16/2020 Unmet Reason: see wound Goal Status: Unmet measurements. Interventions: Assess patient/caregiver ability to obtain necessary supplies Assess patient/caregiver ability to perform ulcer/skin care regimen upon admission and as needed Assess ulceration(s) every visit Provide education on ulcer and skin care Notes: Electronic Signature(s) Signed: 03/17/2021 6:01:32 PM By: Baruch Gouty RN, BSN Entered By: Baruch Gouty on 03/17/2021 14:03:30 -------------------------------------------------------------------------------- Pain Assessment Details Patient Name: Date of Service: Reinaldo Meeker [name] 03/17/2021 1:15 PM Medical Record Number: [id number] Patient Account Number: [id number] Date of Birth/Sex: Treating RN: 11/13/1955 (65 y.o. [name] Primary Care Ursala Cressy: [name] Other Clinician: Referring Jordan Pardini: Treating Abreanna Drawdy/Extender: Drucilla Schmidt Weeks in Treatment: 21 Active Problems Location of Pain Severity and Description of Pain Patient Has Paino Yes Site Locations Pain Location: Pain Location: Pain in Ulcers With Dressing Change: Yes Duration of the Pain. Constant / Intermittento Intermittent Rate the pain. Current Pain Level: 7 Worst Pain Level: 9 Least Pain Level: 0 Character of Pain Describe the Pain: Aching, Tender Pain Management and Medication Current Pain Management: Medication: Yes Other: reposition Is the Current Pain Management Adequate: Adequate How does your wound impact your activities of daily livingo Sleep: No Bathing: No Appetite: No Relationship With Others: No Bladder Continence: No Emotions: No Bowel Continence: No Work: No Toileting: No Drive: No Dressing: No Hobbies: No Electronic Signature(s) Signed: 03/17/2021 6:01:32 PM By: Baruch Gouty RN, BSN Entered By: Baruch Gouty on 03/17/2021 13:37:55 -------------------------------------------------------------------------------- Patient/Caregiver Education Details Patient Name: Date of Service: Reinaldo Meeker  [name] 9/29/2022andnbsp1:15 PM Medical Record Number: [id number] Patient Account Number: [id number] Date of Birth/Gender: Treating RN: 08-Jun-1956 (65 y.o. [name] Primary Care Physician: [name] Other Clinician: Referring Physician: Treating Physician/Extender: Alycia Rossetti in Treatment: 21 Education Assessment Education Provided To: Patient Education Topics Provided Pressure: Methods: Explain/Verbal Responses: Reinforcements needed, State content correctly Wound/Skin Impairment: Methods: Explain/Verbal Responses: Reinforcements needed, State content correctly Electronic Signature(s) Signed: 03/17/2021 6:01:32 PM By: Baruch Gouty RN, BSN Signed: 03/17/2021 6:01:32 PM By: Baruch Gouty RN, BSN Entered By: Baruch Gouty on 03/17/2021  14:04:14 -------------------------------------------------------------------------------- Wound Assessment Details Patient Name: Date of Service: Reinaldo Meeker [name] 03/17/2021 1:15 PM Medical Record Number: [id number] Patient Account Number: [id number] Date of Birth/Sex: Treating RN: September 18, 1955 (65 y.o. [name], [name] Primary Care Emmagene Ortner: [name] Other Clinician: Referring Teegan Guinther: Treating Fuller Makin/Extender: Drucilla Schmidt Weeks in Treatment: 21 Wound Status Wound Number: 3 Primary Pressure Ulcer Etiology: Wound Location: Sacrum Wound Open Wounding Event: Pressure Injury Status: Date Acquired: 07/20/2020 Comorbid Anemia, Deep Vein Thrombosis, Hypertension, Peripheral Weeks Of Treatment: 21 History: Venous Disease, Type II Diabetes Clustered Wound: No Photos Wound Measurements Length: (cm) 2.5 Width: (cm) 3.4 Depth: (cm) 1.8 Area: (cm) 6.676 Volume: (cm) 12.017 % Reduction in Area: -13.3% % Reduction in Volume: -36% Epithelialization: None Tunneling: No Undermining: Yes Starting Position (o'clock): 11 Ending Position (o'clock): 2 Maximum Distance: (cm) 3.4 Wound Description Classification: Category/Stage III Wound Margin: Well defined, not attached Exudate Amount: Large Exudate Type: Serosanguineous Exudate Color: red, brown Foul Odor After Cleansing: No Slough/Fibrino Yes Wound Bed Granulation Amount: Large (67-100%) Exposed Structure Granulation Quality: Red, Pink Fascia Exposed: No Necrotic Amount: Small (1-33%) Fat Layer (Subcutaneous Tissue) Exposed: Yes Necrotic Quality: Adherent Slough Tendon Exposed: No Muscle Exposed: No Joint Exposed: No Bone Exposed: No Electronic Signature(s) Signed: 03/17/2021 2:50:47 PM By: Deon Pilling RN, BSN Signed: 03/17/2021 6:01:32 PM By: Baruch Gouty RN, BSN Entered By: Baruch Gouty on 03/17/2021  13:55:00 -------------------------------------------------------------------------------- Wound Assessment Details Patient Name: Date of Service: Reinaldo Meeker [name] 03/17/2021 1:15 PM Medical Record Number: [id number] Patient Account Number: [id number] Date of Birth/Sex: Treating RN: Jan 01, 1956 (65 y.o. [name], [name] Primary Care Minna Dumire: [name] Other Clinician: Referring Margaretha Mahan: Treating Taijuan Serviss/Extender: Drucilla Schmidt Weeks in Treatment: 21 Wound Status Wound Number: 4 Primary Pressure Ulcer Etiology: Wound Location: Right Gluteus Wound Open Wounding Event: Pressure Injury Status: Date Acquired: 12/30/2020 Comorbid Anemia, Deep Vein Thrombosis, Hypertension, Peripheral Weeks Of Treatment: 11 History: Venous Disease, Type II Diabetes Clustered Wound: Yes Photos Wound Measurements Length: (cm) 6.8 Width: (cm) 8.5 Depth: (cm) 0.1 Clustered Quantity: 5 Area: (cm) 45.396 Volume: (cm) 4.54 % Reduction in Area: -445.3% % Reduction in Volume: -445% Epithelialization: Small (1-33%) Tunneling: No Undermining: No Wound Description Classification: Category/Stage III Wound Margin: Flat and Intact Exudate Amount: Large Exudate Type: Serosanguineous Exudate Color: red, brown Foul Odor After Cleansing: No Slough/Fibrino Yes Wound Bed Granulation Amount: Medium (34-66%) Exposed Structure Granulation Quality: Pink Fascia Exposed: No Necrotic Amount: Medium (34-66%) Fat Layer (Subcutaneous Tissue) Exposed: Yes Necrotic Quality: Adherent Slough Tendon Exposed: No Muscle Exposed: No Joint Exposed: No Bone Exposed: No Electronic Signature(s) Signed: 03/17/2021 2:50:47 PM By: Deon Pilling RN, BSN Signed: 03/17/2021 6:01:32 PM By: Baruch Gouty RN, BSN Entered By: Baruch Gouty on 03/17/2021 13:56:18 -------------------------------------------------------------------------------- Vitals Details Patient Name: Date of Service: [name] 03/17/2021 1:15 PM Medical Record Number: [id number] Patient Account  Number: [id number] Date of Birth/Sex: Treating RN: [birth year] (65 y.o. [name] Primary Care Garvey Westcott: [name] Other Clinician: Referring Madissen Wyse: Treating Mellody Masri/Extender: Drucilla Schmidt Weeks in Treatment: 21 Vital Signs Time Taken: 13:36 Temperature (F): 97.8 Height (in): 63 Pulse (bpm): 95 Source: Stated Respiratory Rate (breaths/min): 18 Weight (lbs): 142 Blood Pressure (mmHg): 92/60 Source: Stated Reference Range: 80 - 120 mg / dl Body Mass Index (BMI): 25.2 Electronic Signature(s) Signed: 03/17/2021 6:01:32 PM By: Baruch Gouty RN, BSN Entered By: Baruch Gouty on 03/17/2021 13:37:01

## 2021-03-31 ENCOUNTER — Other Ambulatory Visit: Payer: Self-pay

## 2021-03-31 ENCOUNTER — Encounter (HOSPITAL_BASED_OUTPATIENT_CLINIC_OR_DEPARTMENT_OTHER): Payer: Medicare Other | Attending: Internal Medicine | Admitting: Internal Medicine

## 2021-03-31 DIAGNOSIS — L89154 Pressure ulcer of sacral region, stage 4: Secondary | ICD-10-CM | POA: Insufficient documentation

## 2021-03-31 DIAGNOSIS — G35 Multiple sclerosis: Secondary | ICD-10-CM | POA: Insufficient documentation

## 2021-03-31 DIAGNOSIS — E119 Type 2 diabetes mellitus without complications: Secondary | ICD-10-CM | POA: Insufficient documentation

## 2021-03-31 DIAGNOSIS — I890 Lymphedema, not elsewhere classified: Secondary | ICD-10-CM | POA: Insufficient documentation

## 2021-03-31 DIAGNOSIS — I10 Essential (primary) hypertension: Secondary | ICD-10-CM | POA: Insufficient documentation

## 2021-03-31 DIAGNOSIS — Z7984 Long term (current) use of oral hypoglycemic drugs: Secondary | ICD-10-CM | POA: Insufficient documentation

## 2021-03-31 DIAGNOSIS — L89319 Pressure ulcer of right buttock, unspecified stage: Secondary | ICD-10-CM | POA: Insufficient documentation

## 2021-03-31 DIAGNOSIS — L03818 Cellulitis of other sites: Secondary | ICD-10-CM | POA: Insufficient documentation

## 2021-03-31 NOTE — Progress Notes (Signed)
LINETTE, GUNDERSON ([id number]) Visit Report for 03/31/2021 HPI Details Patient Name: Date of Service: Reinaldo Meeker [name] 03/31/2021 1:15 PM Medical Record Number: [id number] Patient Account Number: [id number] Date of Birth/Sex: Treating RN: Oct 19, 1955 (65 y.o. [name], [name] Primary Care Provider: [name] Other Clinician: Referring Provider: Treating Provider/Extender: Drucilla Schmidt Weeks in Treatment: 23 History of Present Illness Location: Midline sacral region Quality: Patient describes this as a soreness Severity: 2 out of 10 Duration: Greater than one year prior to presentation to the wound center Timing: Pain occurs most often with dressing changes Context: Developmental result of pressure over time to the sacral region Modifying Factors: Patient has previously had this wound packed with gauze, she has had a wound VAC, and she tells me other various wound care treatments over the past year. ssociated Signs and Symptoms: Multiple sclerosis, hypertension, diabetes mellitus type 2, lymphedema A HPI Description: 03/27/16 patient presents today on initial evaluation concerning her sacral pressure injury which has been present for roughly one year. She tells me that she does not know of any x-rays or other advanced imaging has been performed in regard to the sacral region. She has been provided with wound care at the [facility] facility at this point in time. She has MS and is not able to move she tells me "at all". She does tell me however that the facility is very good about re-positioning her at least every 2 hours. With that being said she did fell the wound VAC apparently. She had this twice the first time she tells me that it did not seem to be working very well and the second time it actually made things worse this was just 2 months ago. Therefore this was discontinued. at this point in time today she is not having any signs or symptoms of  systemic infection she is on a blood thinner which is Eliquis at this point in time. 03/24/16; patient admitted to our clinic by Jeri Cos last week. She is a lady with advanced MS who is had a wound on her lower sacral area for most of the last year. She reiterates to me that wound vacs have not worked for her in fact she states they've actually made her wound worse on the last occasion. An x- ray was apparently done in the facility although I don't have these results. 03/31/16; this patient had an x-ray done at the facility that did not show evidence of osteomyelitis. Her bone biopsy did not show osteomyelitis. 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 She had been suggested to have collagen on the base of the wound covered with normal saline wet to dry although her intake nurse reports there was no collagen on the wound surface. 05/15/16 wound is not changed that much. This is a substantial stage IV wound with considerable undermining from 7 to 3:00. I have spoken to the wound care nurse at the [facility] facility, there was no records on what exactly caused the wound VAC to fail or records of the same. 06/05/16; I received communication from [facility] skilled facility reporting that the patient refuses the wound back more often that it's on. When I confronted her with this today in the room she denied that she was responsible stating for the most part the  problem is because she has trouble getting them to deal with her Foley catheter properly. I don't exactly follow her line of reasoning here however apparently they've been putting hydrogel wet to dry and in the facility 07/03/16; I have not heard anything from [place]. The patient states she has not had a wound VAC the last 2-3 days due to "battery  problems". She arrives today with wet to dry dressing. She has advanced MS and is a chronic care resident at the skilled facility 07/31/16; patient is a resident of [facility] skilled facility. She has had the VAC applied but did not arrive with the [facility] in place. Her wound generally looks better perhaps somewhat less tunneling superiorly. There is still some exposed bone however. 09/11/16; I follow this patient with advanced MS who is a resident of [facility] skilled facility on a monthly basis. She arrives 6 weeks after her last visit. The wound VAC apparently is no longer being used according to the patient because the nurses in the facility could not maintain a seal in this area. Via the patient it seems that they're using some form of wet to dry dressing although he received no documentation. The patient states she is eating well she is being turned in the facility. 10/09/16; I follow this patient with advanced MS who is a resident of [facility]. She apparently could not have a wound VAC is the facility could not maintain a seal therefore they're using wet-to-dry dressings however the patient arrives today with better measurements and a nicely granulated wound bed. 11/05/16; patient with advanced MS was a pressure area on the lower sacrum/coccyx. She could not have a wound VAC because the facility could n/ot maintain a seal. I ordered Prisma last time. Wound dimensions are about the same per intake 12/04/16; pressure area on the lower sacrum. using collagen. Has a new concern on the right great toe 01/01/17; patient has now only a small open area on the lower sacrumcoccyx she is been doing well and this wound is gradually progressing towards closure. The area on the right great toe that looked to me like a paronychia last time is also quite a bit better although the nail here looks as though it's loosening READMISSION 10/19/2020 This is a patient with advanced multiple sclerosis. We had her in clinic  for a prolonged period in late 2017 to the summer 2018 with a stage IV wound on her lower sacrum/coccyx. I do not think we healed her out although her wound was a lot better. She was a resident at [facility] skilled facility at the time The patient states the wound reopened about 3 or 4 months ago. I am not completely certain from talking to the patient if this is in the same position as last time or even indeed if it ever closed. 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 She has a home health aide who comes twice a day. She had an air mattress but apparently it does not work As far as I can tell there is not been any major change in her medical problem list except she lives at home now. She is a diabetic on metformin 6/23; patient has not been here in about 6-7 weeks. I am not sure what the issue is and the patient does not seem to really know. She takes scat for transportation although that has not  been a problem before. We changed her dressing last time she was here to silver collagen moistened with the backing wet- to-dry and a foam border. Her wound is quite a bit better looking. 7/14; 3-week follow-up. The patient's wound is not as good as last time. It is wider I think more undermining superiorly. Also there appears to be superficial skin breakdown superiorly and the skin around the wound and also from about 2-4 o'clock. Not a particularly viable surface. We have been using silver collagen and backing wet-to-dry. The patient lives in her own home. She apparently has care attendance for almost all of the day. She has a surface on her bed but this is clearly not a level 3. She also has home health. She claims to be eating well 8/4; patient presents for 3-week follow-up. She has no issues or complaints today. She reports using wet-to-dry dressings for her sacral wound and collagen to the  buttocks wound. She states that her new mattress is arriving today 9/29; its been almost 2 months since the patient was in the clinic. She has a sacral wound which is worse I think with exposed bone to the right under the undermining area. Also concerning is a marked deterioration of her left buttock. With a superficial necrotic area and several small superficial areas. She has been using silver collagen wet-to-dry. The patient has a level 2 surface certainly would benefit from a level 3 if that can be provided. She is relatively immobile cannot turn her self secondary to multiple sclerosis. She tells me she has an aide for most of the day except for 4 hours. She also states she is not up much in a wheelchair. I wonder about her oral intake although she says she eats 3 meals a day and takes Glucerna 10/13; the patient looks somewhat better than 2 weeks ago today. I gave her empiric Augmentin which she took a mobile x-ray was negative for osteomyelitis but they suggested a bone scan. The wound area actually looks somewhat better today Electronic Signature(s) Signed: 03/31/2021 4:49:53 PM By: Linton Ham MD Entered By: Linton Ham on 03/31/2021 14:01:22 -------------------------------------------------------------------------------- Physical Exam Details Patient Name: Date of Service: Reinaldo Meeker [name] 03/31/2021 1:15 PM Medical Record Number: [id number] Patient Account Number: [id number] Date of Birth/Sex: Treating RN: 12-29-1955 (65 y.o. [name] Primary Care Provider: [name] Other Clinician: Referring Provider: Treating Provider/Extender: Drucilla Schmidt Weeks in Treatment: 23 Constitutional Sitting or standing Blood Pressure is within target range for patient.. Pulse regular and within target range for patient.Marland Kitchen Respirations regular, non-labored and within target range.. Temperature is normal and within the target range for the patient.Marland Kitchen Appears  in no distress. Genitourinary (GU) . Notes Wound exam; better surface to the wound. The tunneling has come in at 12:00. The area of tissue above the wound that I was concerned about last time looks somewhat better. There is no crepitus no purulence. Electronic Signature(s) Signed: 03/31/2021 4:49:53 PM By: Linton Ham MD Entered By: Linton Ham on 03/31/2021 14:04:35 -------------------------------------------------------------------------------- Physician Orders Details Patient Name: Date of Service: Reinaldo Meeker [name] 03/31/2021 1:15 PM Medical Record Number: [id number] Patient Account Number: [id number] Date of Birth/Sex: Treating RN: 1956-03-23 (65 y.o. [name], [name] Primary Care Provider: [name] Other Clinician: Referring Provider: Treating Provider/Extender: Drucilla Schmidt Weeks in Treatment: 83 Verbal / Phone Orders: No Diagnosis Coding Follow-up Appointments ppointment in 2 weeks. - Dr. Dellia Nims ***60 minutes  extra time hoyer**** Return A Bathing/ Shower/ Hygiene May shower with protection but do not get wound dressing(s) wet. - May shower and use soap and water on days dressings are changed Off-Loading Low air-loss mattress (Group 2) A fluidized (Group 3) mattress ir Turn and reposition every 2 hours [place] wound care orders this week; continue Home Health for wound care. May utilize formulary equivalent dressing for wound treatment orders unless otherwise specified. - change dressing to silver alginate Dressing changes to be completed by [place] on Monday / Wednesday / Friday except when patient has scheduled visit at [facility]. Other Home Health Orders/Instructions: - [facility] Wound Treatment Wound #3 - Sacrum Cleanser: Soap and Water [facility]) Every Other Day/30 Days Discharge Instructions: May shower and wash wound with dial antibacterial soap and water prior to dressing change. Cleanser: Wound  Cleanser [facility]) Every Other Day/30 Days Discharge Instructions: Cleanse the wound with wound cleanser prior to applying a clean dressing using gauze sponges, not tissue or cotton balls. Peri-Wound Care: Skin Prep [facility]) Every Other Day/30 Days Discharge Instructions: Use skin prep as directed Prim Dressing: KerraCel Ag Gelling Fiber Dressing, 4x5 in (silver alginate) (Home Health) Every Other Day/30 Days ary Discharge Instructions: Apply silver alginate to wound bed as instructed Prim Dressing: Pack with saline moistened gauze on top of prisma (Eugenio Saenz) Every Other Day/30 Days ary Secondary Dressing: Woven Gauze Sponge, Non-Sterile 4x4 in ([place]) Every Other Day/30 Days Discharge Instructions: Apply over primary dressing as directed. Secondary Dressing: MPM Excel SAP Bordered Dressing, 7x6.7 (Sacral) (in/in) (Home Health) Every Other Day/30 Days Discharge Instructions: Apply silicone border over primary dressing as directed. Wound #4 - Gluteus Wound Laterality: Right Cleanser: Soap and Water [facility]) Every Other Day/30 Days Discharge Instructions: May shower and wash wound with dial antibacterial soap and water prior to dressing change. Cleanser: Wound Cleanser [facility]) Every Other Day/30 Days Discharge Instructions: Cleanse the wound with wound cleanser prior to applying a clean dressing using gauze sponges, not tissue or cotton balls. Peri-Wound Care: Skin Prep [facility]) Every Other Day/30 Days Discharge Instructions: Use skin prep as directed Prim Dressing: KerraCel Ag Gelling Fiber Dressing, 4x5 in (silver alginate) Every Other Day/30 Days ary Discharge Instructions: Apply silver alginate to wound bed as instructed Secondary Dressing: Woven Gauze Sponge, Non-Sterile 4x4 in [facility]) Every Other Day/30 Days Discharge Instructions: Apply over primary dressing as directed. Secondary Dressing: MPM Excel SAP Bordered Dressing, 7x6.7 (Sacral) (in/in)  (Home Health) Every Other Day/30 Days Discharge Instructions: Apply silicone border over primary dressing as directed. Electronic Signature(s) Signed: 03/31/2021 4:45:09 PM By: Rhae Hammock RN Signed: 03/31/2021 4:49:53 PM By: Linton Ham MD Entered By: Rhae Hammock on 03/31/2021 13:26:40 -------------------------------------------------------------------------------- Problem List Details Patient Name: Date of Service: Reinaldo Meeker [name] 03/31/2021 1:15 PM Medical Record Number: [id number] Patient Account Number: [id number] Date of Birth/Sex: Treating RN: 1955-09-11 (65 y.o. [name], [name] Primary Care Provider: [name] Other Clinician: Referring Provider: Treating Provider/Extender: Drucilla Schmidt Weeks in Treatment: 23 Active Problems ICD-10 Encounter Code Description Active Date MDM Diagnosis L89.154 Pressure ulcer of sacral region, stage 4 10/19/2020 No Yes G35 Multiple sclerosis 10/19/2020 No Yes L89.319 Pressure ulcer of right buttock, unspecified stage 03/17/2021 No Yes L03.818 Cellulitis of other sites 03/17/2021 No Yes Inactive Problems Resolved Problems Electronic Signature(s) Signed: 03/31/2021 4:49:53 PM By: Linton Ham MD Entered By: Linton Ham on 03/31/2021 13:57:13 -------------------------------------------------------------------------------- Progress Note Details Patient  Name: Date of Service: Reinaldo Meeker [name] 03/31/2021 1:15 PM Medical Record Number: [id number] Patient Account Number: [id number] Date of Birth/Sex: Treating RN: 04/18/1956 (64 y.o. [name], [name] Primary Care Provider: [name] Other Clinician: Referring Provider: Treating Provider/Extender: Drucilla Schmidt Weeks in Treatment: 23 Subjective History of Present Illness (HPI) The following HPI elements were documented for the patient's wound: Location: Midline sacral region Quality: Patient describes this as a  soreness Severity: 2 out of 10 Duration: Greater than one year prior to presentation to the wound center Timing: Pain occurs most often with dressing changes Context: Developmental result of pressure over time to the sacral region Modifying Factors: Patient has previously had this wound packed with gauze, she has had a wound VAC, and she tells me other various wound care treatments over the past year. Associated Signs and Symptoms: Multiple sclerosis, hypertension, diabetes mellitus type 2, lymphedema 03/27/16 patient presents today on initial evaluation concerning her sacral pressure injury which has been present for roughly one year. She tells me that she does not know of any x-rays or other advanced imaging has been performed in regard to the sacral region. She has been provided with wound care at the [facility] facility at this point in time. She has MS and is not able to move she tells me "at all". She does tell me however that the facility is very good about re-positioning her at least every 2 hours. With that being said she did fell the wound VAC apparently. She had this twice the first time she tells me that it did not seem to be working very well and the second time it actually made things worse this was just 2 months ago. Therefore this was discontinued. at this point in time today she is not having any signs or symptoms of systemic infection she is on a blood thinner which is Eliquis at this point in time. 03/24/16; patient admitted to our clinic by Jeri Cos last week. She is a lady with advanced MS who is had a wound on her lower sacral area for most of the last year. She reiterates to me that wound vacs have not worked for her in fact she states they've actually made her wound worse on the last occasion. An x- ray was apparently done in the facility although I don't have these results. 03/31/16; this patient had an x-ray done at the facility that did not show evidence of  osteomyelitis. Her bone biopsy did not show osteomyelitis. 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 She had been suggested to have collagen on the base of the wound covered with normal saline wet to dry although her intake nurse reports there was no collagen on the wound surface. 05/15/16 wound is not changed that much. This is a substantial stage IV wound with considerable undermining from 7 to 3:00. I have spoken to the wound care nurse at the [facility] facility, there was no records on what exactly caused the wound VAC to fail or records of the same. 06/05/16; I received communication from [place] skilled facility reporting that the patient refuses the wound back more often that it's on. When I confronted her with this today in the room she denied that she was responsible stating for the most part the problem  is because she has trouble getting them to deal with her Foley catheter properly. I don't exactly follow her line of reasoning here however apparently they've been putting hydrogel wet to dry and in the facility 07/03/16; I have not heard anything from [place]. The patient states she has not had a wound VAC the last 2-3 days due to "battery problems". She arrives today with wet to dry dressing. She has advanced MS and is a chronic care resident at the skilled facility 07/31/16; patient is a resident of [facility] skilled facility. She has had the VAC applied but did not arrive with the [facility] in place. Her wound generally looks better perhaps somewhat less tunneling superiorly. There is still some exposed bone however. 09/11/16; I follow this patient with advanced MS who is a resident of [facility] skilled facility on a monthly basis. She arrives 6 weeks after her last visit. The wound VAC  apparently is no longer being used according to the patient because the nurses in the facility could not maintain a seal in this area. Via the patient it seems that they're using some form of wet to dry dressing although he received no documentation. The patient states she is eating well she is being turned in the facility. 10/09/16; I follow this patient with advanced MS who is a resident of [facility]. She apparently could not have a wound VAC is the facility could not maintain a seal therefore they're using wet-to-dry dressings however the patient arrives today with better measurements and a nicely granulated wound bed. 11/05/16; patient with advanced MS was a pressure area on the lower sacrum/coccyx. She could not have a wound VAC because the facility could n/ot maintain a seal. I ordered Prisma last time. Wound dimensions are about the same per intake 12/04/16; pressure area on the lower sacrum. using collagen. Has a new concern on the right great toe 01/01/17; patient has now only a small open area on the lower sacrumoococcyx she is been doing well and this wound is gradually progressing towards closure. The area on the right great toe that looked to me like a paronychia last time is also quite a bit better although the nail here looks as though it's loosening READMISSION 10/19/2020 This is a patient with advanced multiple sclerosis. We had her in clinic for a prolonged period in late 2017 to the summer 2018 with a stage IV wound on her lower sacrum/coccyx. I do not think we healed her out although her wound was a lot better. She was a resident at [facility] skilled facility at the time The patient states the wound reopened about 3 or 4 months ago. I am not completely certain from talking to the patient if this is in the same position as last time or even indeed if it ever closed. This is a stage IV wound with exposed bone at 3:00 I think they are using silver alginate base dressing she has  home health 1 time a week using Aquacel. She has a home health aide who comes twice a day. She had an air mattress but apparently it does not work As far as I can tell there is not been any major change in her medical problem list except she lives at home now. She is a diabetic on metformin 6/23; patient has not been here in about 6-7 weeks. I am not sure what the issue is and the patient does not seem to really know. She takes scat for transportation although that has not been  a problem before. We changed her dressing last time she was here to silver collagen moistened with the backing wet- to-dry and a foam border. Her wound is quite a bit better looking. 7/14; 3-week follow-up. The patient's wound is not as good as last time. It is wider I think more undermining superiorly. Also there appears to be superficial skin breakdown superiorly and the skin around the wound and also from about 2-4 o'clock. Not a particularly viable surface. We have been using silver collagen and backing wet-to-dry. The patient lives in her own home. She apparently has care attendance for almost all of the day. She has a surface on her bed but this is clearly not a level 3. She also has home health. She claims to be eating well 8/4; patient presents for 3-week follow-up. She has no issues or complaints today. She reports using wet-to-dry dressings for her sacral wound and collagen to the buttocks wound. She states that her new mattress is arriving today 9/29; its been almost 2 months since the patient was in the clinic. She has a sacral wound which is worse I think with exposed bone to the right under the undermining area. Also concerning is a marked deterioration of her left buttock. With a superficial necrotic area and several small superficial areas. She has been using silver collagen wet-to-dry. The patient has a level 2 surface certainly would benefit from a level 3 if that can be provided. She is relatively immobile  cannot turn her self secondary to multiple sclerosis. She tells me she has an aide for most of the day except for 4 hours. She also states she is not up much in a wheelchair. I wonder about her oral intake although she says she eats 3 meals a day and takes Glucerna 10/13; the patient looks somewhat better than 2 weeks ago today. I gave her empiric Augmentin which she took a mobile x-ray was negative for osteomyelitis but they suggested a bone scan. The wound area actually looks somewhat better today Objective Constitutional Sitting or standing Blood Pressure is within target range for patient.. Pulse regular and within target range for patient.Marland Kitchen Respirations regular, non-labored and within target range.. Temperature is normal and within the target range for the patient.Marland Kitchen Appears in no distress. Vitals Time Taken: 1:09 PM, Height: 63 in, Weight: 142 lbs, BMI: 25.2, Temperature: 98 F, Pulse: 87 bpm, Respiratory Rate: 17 breaths/min, Blood Pressure: 103/68 mmHg. General Notes: Wound exam; better surface to the wound. The tunneling has come in at 12:00. The area of tissue above the wound that I was concerned about last time looks somewhat better. There is no crepitus no purulence. Integumentary (Hair, Skin) Wound #3 status is Open. Original cause of wound was Pressure Injury. The date acquired was: 07/20/2020. The wound has been in treatment 23 weeks. The wound is located on the Sacrum. The wound measures 3.5cm length x 2.7cm width x 2cm depth; 7.422cm^2 area and 14.844cm^3 volume. There is Fat Layer (Subcutaneous Tissue) exposed. There is no tunneling noted, however, there is undermining starting at 7:00 and ending at 4:00 with a maximum distance of 2.6cm. There is a large amount of serosanguineous drainage noted. The wound margin is well defined and not attached to the wound base. There is large (67- 100%) red, pink granulation within the wound bed. There is a small (1-33%) amount of necrotic tissue  within the wound bed including Adherent Slough. Wound #4 status is Open. Original cause of wound was Pressure Injury. The  date acquired was: 12/30/2020. The wound has been in treatment 13 weeks. The wound is located on the Right Gluteus. The wound measures 4cm length x 4cm width x 0.2cm depth; 12.566cm^2 area and 2.513cm^3 volume. There is Fat Layer (Subcutaneous Tissue) exposed. There is no tunneling or undermining noted. There is a large amount of serosanguineous drainage noted. The wound margin is flat and intact. There is medium (34-66%) pink granulation within the wound bed. There is a medium (34-66%) amount of necrotic tissue within the wound bed including Adherent Slough. Assessment Active Problems ICD-10 Pressure ulcer of sacral region, stage 4 Multiple sclerosis Pressure ulcer of right buttock, unspecified stage Cellulitis of other sites Plan Follow-up Appointments: Return Appointment in 2 weeks. - Dr. Dellia Nims ***60 minutes extra time hoyer**** Bathing/ Shower/ Hygiene: May shower with protection but do not get wound dressing(s) wet. - May shower and use soap and water on days dressings are changed Off-Loading: Low air-loss mattress (Group 2) Air fluidized (Group 3) mattress Turn and reposition every 2 hours Home Health: New wound care orders this week; continue Home Health for wound care. May utilize formulary equivalent dressing for wound treatment orders unless otherwise specified. - change dressing to silver alginate Dressing changes to be completed by [place] on Monday / Wednesday / Friday except when patient has scheduled visit at [facility]. Other Home Health Orders/Instructions: - [facility] WOUND #3: - Sacrum Wound Laterality: Cleanser: Soap and Water [facility]) Every Other Day/30 Days Discharge Instructions: May shower and wash wound with dial antibacterial soap and water prior to dressing change. Cleanser: Wound Cleanser [facility]) Every Other Day/30  Days Discharge Instructions: Cleanse the wound with wound cleanser prior to applying a clean dressing using gauze sponges, not tissue or cotton balls. Peri-Wound Care: Skin Prep [facility]) Every Other Day/30 Days Discharge Instructions: Use skin prep as directed Prim Dressing: KerraCel Ag Gelling Fiber Dressing, 4x5 in (silver alginate) (Home Health) Every Other Day/30 Days ary Discharge Instructions: Apply silver alginate to wound bed as instructed Prim Dressing: Pack with saline moistened gauze on top of prisma ([place]) Every Other Day/30 Days ary Secondary Dressing: Woven Gauze Sponge, Non-Sterile 4x4 in ([place]) Every Other Day/30 Days Discharge Instructions: Apply over primary dressing as directed. Secondary Dressing: MPM Excel SAP Bordered Dressing, 7x6.7 (Sacral) (in/in) (Home Health) Every Other Day/30 Days Discharge Instructions: Apply silicone border over primary dressing as directed. WOUND #4: - Gluteus Wound Laterality: Right Cleanser: Soap and Water [facility]) Every Other Day/30 Days Discharge Instructions: May shower and wash wound with dial antibacterial soap and water prior to dressing change. Cleanser: Wound Cleanser [facility]) Every Other Day/30 Days Discharge Instructions: Cleanse the wound with wound cleanser prior to applying a clean dressing using gauze sponges, not tissue or cotton balls. Peri-Wound Care: Skin Prep [facility]) Every Other Day/30 Days Discharge Instructions: Use skin prep as directed Prim Dressing: KerraCel Ag Gelling Fiber Dressing, 4x5 in (silver alginate) Every Other Day/30 Days ary Discharge Instructions: Apply silver alginate to wound bed as instructed Secondary Dressing: Woven Gauze Sponge, Non-Sterile 4x4 in [facility]) Every Other Day/30 Days Discharge Instructions: Apply over primary dressing as directed. Secondary Dressing: MPM Excel SAP Bordered Dressing, 7x6.7 (Sacral) (in/in) (Home Health) Every Other Day/30  Days Discharge Instructions: Apply silicone border over primary dressing as directed. 1. Still using silver alginate 2. Not certain where we were in getting her a level 3 surface 3. I had some thoughts about ordering advanced imaging here but  she looks somewhat better today. She would need to go to the hospital because of the ability to transfer her with a [facility] lift and radiology. 4. It would also be possible to do a bone biopsy Electronic Signature(s) Signed: 03/31/2021 4:49:53 PM By: Linton Ham MD Entered By: Linton Ham on 03/31/2021 14:05:51 -------------------------------------------------------------------------------- SuperBill Details Patient Name: Date of Service: Reinaldo Meeker [name] 03/31/2021 Medical Record Number: [id number] Patient Account Number: [id number] Date of Birth/Sex: Treating RN: [birth year] (65 y.o. [name], [name] Primary Care Provider: [name] Other Clinician: Referring Provider: Treating Provider/Extender: Drucilla Schmidt Weeks in Treatment: 23 Diagnosis Coding ICD-10 Codes Code Description L89.154 Pressure ulcer of sacral region, stage 4 G35 Multiple sclerosis L89.319 Pressure ulcer of right buttock, unspecified stage L03.818 Cellulitis of other sites Physician Procedures : CPT4 Code Description Modifier [id number] - WC PHYS LEVEL 3 - EST PT ICD-10 Diagnosis Description L89.154 Pressure ulcer of sacral region, stage 4 L89.319 Pressure ulcer of right buttock, unspecified stage Quantity: 1 Electronic Signature(s) Signed: 03/31/2021 4:49:53 PM By: Linton Ham MD Entered By: Linton Ham on 03/31/2021 14:06:29

## 2021-04-12 NOTE — Progress Notes (Signed)
HARINI, DEARMOND ([id number]) Visit Report for 03/31/2021 Arrival Information Details Patient Name: Date of Service: Reinaldo Meeker [name] 03/31/2021 1:15 PM Medical Record Number: [id number] Patient Account Number: [id number] Date of Birth/Sex: Treating RN: [birth year] (64 y.o. [name], [name] Primary Care Stanislawa Gaffin: [name] Other Clinician: Referring Ismael Karge: Treating Evrett Hakim/Extender: Drucilla Schmidt Weeks in Treatment: 23 Visit Information History Since Last Visit Added or deleted any medications: No Patient Arrived: Wheel Chair Any new allergies or adverse reactions: No Arrival Time: 13:08 Had a fall or experienced change in No Accompanied By: self activities of daily living that may affect Transfer Assistance: Harrel Lemon Lift risk of falls: Patient Identification Verified: Yes Signs or symptoms of abuse/neglect since last visito No Secondary Verification Process Completed: Yes Hospitalized since last visit: No Patient Requires Transmission-Based Precautions: No Implantable device outside of the clinic excluding No Patient Has Alerts: Yes cellular tissue based products placed in the center Patient Alerts: Patient on Blood Thinner since last visit: Has Dressing in Place as Prescribed: Yes Pain Present Now: No Electronic Signature(s) Signed: 03/31/2021 4:45:09 PM By: Rhae Hammock RN Entered By: Rhae Hammock on 03/31/2021 13:09:15 -------------------------------------------------------------------------------- Lower Extremity Assessment Details Patient Name: Date of Service: Reinaldo Meeker [name] 03/31/2021 1:15 PM Medical Record Number: [id number] Patient Account Number: [id number] Date of Birth/Sex: Treating RN: 09-18-1955 (65 y.o. [name], [name] Primary Care Mister Krahenbuhl: [name] Other Clinician: Referring Leotis Isham: Treating Almond Fitzgibbon/Extender: Drucilla Schmidt Weeks in Treatment: 23 Electronic  Signature(s) Signed: 03/31/2021 4:45:09 PM By: Rhae Hammock RN Entered By: Rhae Hammock on 03/31/2021 13:21:15 -------------------------------------------------------------------------------- Multi Wound Chart Details Patient Name: Date of Service: Reinaldo Meeker [name] 03/31/2021 1:15 PM Medical Record Number: [id number] Patient Account Number: [id number] Date of Birth/Sex: Treating RN: 1956/03/25 (65 y.o. [name], [name] Primary Care Rontrell Moquin: [name] Other Clinician: Referring Etheridge Geil: Treating Ulyess Muto/Extender: Drucilla Schmidt Weeks in Treatment: 23 Vital Signs Height(in): 16 Pulse(bpm): 61 Weight(lbs): 142 Blood Pressure(mmHg): 103/68 Body Mass Index(BMI): 25 Temperature(F): 98 Respiratory Rate(breaths/min): 17 Photos: [N/A:N/A] Sacrum Right Gluteus N/A Wound Location: Pressure Injury Pressure Injury N/A Wounding Event: Pressure Ulcer Pressure Ulcer N/A Primary Etiology: Anemia, Deep Vein Thrombosis, Anemia, Deep Vein Thrombosis, N/A Comorbid History: Hypertension, Peripheral Venous Hypertension, Peripheral Venous Disease, Type II Diabetes Disease, Type II Diabetes 07/20/2020 12/30/2020 N/A Date Acquired: 23 13 N/A Weeks of Treatment: Open Open N/A Wound Status: No Yes N/A Clustered Wound: N/A 5 N/A Clustered Quantity: 3.5x2.7x2 4x4x0.2 N/A Measurements L x W x D (cm) 7.422 12.566 N/A A (cm) : rea 14.844 2.513 N/A Volume (cm) : -26.00% -50.90% N/A % Reduction in A rea: -68.00% -201.70% N/A % Reduction in Volume: 7 Starting Position 1 (o'clock): 4 Ending Position 1 (o'clock): 2.6 Maximum Distance 1 (cm): Yes No N/A Undermining: Category/Stage III Category/Stage III N/A Classification: Large Large N/A Exudate A mount: Serosanguineous Serosanguineous N/A Exudate Type: red, brown red, brown N/A Exudate Color: Well defined, not attached Flat and Intact N/A Wound Margin: Large (67-100%) Medium (34-66%)  N/A Granulation A mount: Red, Pink Pink N/A Granulation Quality: Small (1-33%) Medium (34-66%) N/A Necrotic A mount: Fat Layer (Subcutaneous Tissue): Yes Fat Layer (Subcutaneous Tissue): Yes N/A Exposed Structures: Fascia: No Fascia: No Tendon: No Tendon: No Muscle: No Muscle: No Joint: No Joint: No Bone: No Bone: No None Small (1-33%) N/A Epithelialization: Treatment Notes Electronic Signature(s) Signed: 03/31/2021 4:49:53 PM By: Linton Ham MD Signed: 03/31/2021 5:46:14 PM By: Rolin Barry  [name] RN, BSN Entered By: Linton Ham on 03/31/2021 14:00:11 -------------------------------------------------------------------------------- Multi-Disciplinary Care Plan Details Patient Name: Date of Service: Reinaldo Meeker [name] 03/31/2021 1:15 PM Medical Record Number: [id number] Patient Account Number: [id number] Date of Birth/Sex: Treating RN: Jun 22, 1955 (65 y.o. [name], [name] Primary Care Cherryl Babin: [name] Other Clinician: Referring Marsa Matteo: Treating Jace Fermin/Extender: Alycia Rossetti in Treatment: 23 Multidisciplinary Care Plan reviewed with physician Active Inactive Pressure Nursing Diagnoses: Knowledge deficit related to causes and risk factors for pressure ulcer development Knowledge deficit related to management of pressures ulcers Potential for impaired tissue integrity related to pressure, friction, moisture, and shear Goals: Patient/caregiver will verbalize understanding of pressure ulcer management Date Initiated: 03/17/2021 Target Resolution Date: 04/15/2021 Goal Status: Active Interventions: Assess: immobility, friction, shearing, incontinence upon admission and as needed Assess offloading mechanisms upon admission and as needed Assess potential for pressure ulcer upon admission and as needed Provide education on pressure ulcers Treatment Activities: Pressure reduction/relief device ordered :  03/17/2021 Notes: Wound/Skin Impairment Nursing Diagnoses: Impaired tissue integrity Knowledge deficit related to ulceration/compromised skin integrity Goals: Patient will have a decrease in wound volume by X% from date: (specify in notes) Date Initiated: 10/19/2020 Date Inactivated: 03/17/2021 Target Resolution Date: 02/18/2021 Goal Status: Unmet Unmet Reason: glut wound deteriorating Patient/caregiver will verbalize understanding of skin care regimen Date Initiated: 10/19/2020 Target Resolution Date: 04/15/2021 Goal Status: Active Ulcer/skin breakdown will have a volume reduction of 30% by week 4 Date Initiated: 10/19/2020 Date Inactivated: 12/09/2020 Target Resolution Date: 11/19/2020 Unmet Reason: see wound Goal Status: Unmet measurements. Ulcer/skin breakdown will have a volume reduction of 50% by week 8 Date Initiated: 10/19/2020 Date Inactivated: 12/30/2020 Target Resolution Date: 12/16/2020 Unmet Reason: see wound Goal Status: Unmet measurements. Interventions: Assess patient/caregiver ability to obtain necessary supplies Assess patient/caregiver ability to perform ulcer/skin care regimen upon admission and as needed Assess ulceration(s) every visit Provide education on ulcer and skin care Notes: Electronic Signature(s) Signed: 03/31/2021 4:45:09 PM By: Rhae Hammock RN Entered By: Rhae Hammock on 03/31/2021 13:25:44 -------------------------------------------------------------------------------- Pain Assessment Details Patient Name: Date of Service: Reinaldo Meeker [name] 03/31/2021 1:15 PM Medical Record Number: [id number] Patient Account Number: [id number] Date of Birth/Sex: Treating RN: Aug 04, 1955 (65 y.o. [name], [name] Primary Care Orvin Netter: [name] Other Clinician: Referring Doshie Maggi: Treating Junelle Hashemi/Extender: Drucilla Schmidt Weeks in Treatment: 23 Active Problems Location of Pain Severity and Description of Pain Patient Has  Paino No Site Locations Pain Management and Medication Current Pain Management: Electronic Signature(s) Signed: 03/31/2021 4:45:09 PM By: Rhae Hammock RN Entered By: Rhae Hammock on 03/31/2021 13:21:08 -------------------------------------------------------------------------------- Patient/Caregiver Education Details Patient Name: Date of Service: Reinaldo Meeker [name] 10/13/2022andnbsp1:15 PM Medical Record Number: [id number] Patient Account Number: [id number] Date of Birth/Gender: Treating RN: 03-15-1956 (65 y.o. [name], [name] Primary Care Physician: [name] Other Clinician: Referring Physician: Treating Physician/Extender: Alycia Rossetti in Treatment: 23 Education Assessment Education Provided To: Patient Education Topics Provided Pressure: Methods: Explain/Verbal Responses: State content correctly Wound/Skin Impairment: Methods: Explain/Verbal Responses: State content correctly Electronic Signature(s) Signed: 03/31/2021 4:45:09 PM By: Rhae Hammock RN Signed: 03/31/2021 4:45:09 PM By: Rhae Hammock RN Entered By: Rhae Hammock on 03/31/2021 13:26:01 -------------------------------------------------------------------------------- Wound Assessment Details Patient Name: Date of Service: Reinaldo Meeker [name] 03/31/2021 1:15 PM Medical Record Number: [id number] Patient Account Number: [id number] Date of Birth/Sex: Treating RN: Nov 25, 1955 (65 y.o. [name], [place] Primary Care Bernerd Terhune: [name] Other  Clinician: Referring Aimi Essner: Treating Abdelrahman Nair/Extender: Salvadore Oxford, Lorenda Ishihara Weeks in Treatment: 23 Wound Status Wound Number: 3 Primary Pressure Ulcer Etiology: Wound Location: Sacrum Wound Open Wounding Event: Pressure Injury Status: Date Acquired: 07/20/2020 Comorbid Anemia, Deep Vein Thrombosis, Hypertension, Peripheral Weeks Of Treatment: 23 History: Venous Disease, Type II  Diabetes Clustered Wound: No Photos Wound Measurements Length: (cm) 3.5 Width: (cm) 2.7 Depth: (cm) 2 Area: (cm) 7.422 Volume: (cm) 14.844 % Reduction in Area: -26% % Reduction in Volume: -68% Epithelialization: None Tunneling: No Undermining: Yes Starting Position (o'clock): 7 Ending Position (o'clock): 4 Maximum Distance: (cm) 2.6 Wound Description Classification: Category/Stage III Wound Margin: Well defined, not attached Exudate Amount: Large Exudate Type: Serosanguineous Exudate Color: red, brown Foul Odor After Cleansing: No Slough/Fibrino Yes Wound Bed Granulation Amount: Large (67-100%) Exposed Structure Granulation Quality: Red, Pink Fascia Exposed: No Necrotic Amount: Small (1-33%) Fat Layer (Subcutaneous Tissue) Exposed: Yes Necrotic Quality: Adherent Slough Tendon Exposed: No Muscle Exposed: No Joint Exposed: No Bone Exposed: No Electronic Signature(s) Signed: 03/31/2021 4:45:09 PM By: Rhae Hammock RN Signed: 04/12/2021 2:37:05 PM By: Sandre Kitty Entered By: Sandre Kitty on 03/31/2021 13:25:37 -------------------------------------------------------------------------------- Wound Assessment Details Patient Name: Date of Service: [name] 03/31/2021 1:15 PM Medical Record Number: [id number] Patient Account Number: [id number] Date of Birth/Sex: Treating RN: 1955/12/05 (65 y.o. [name], [name] Primary Care Zevin Nevares: [name] Other Clinician: Referring Glendel Jaggers: Treating Laniesha Das/Extender: Drucilla Schmidt Weeks in Treatment: 23 Wound Status Wound Number: 4 Primary Pressure Ulcer Etiology: Wound Location: Right Gluteus Wound Open Wounding Event: Pressure Injury Status: Date Acquired: 12/30/2020 Comorbid Anemia, Deep Vein Thrombosis, Hypertension, Peripheral Weeks Of Treatment: 13 History: Venous Disease, Type II Diabetes Clustered Wound: Yes Photos Wound Measurements Length: (cm) 4 Width:  (cm) 4 Depth: (cm) 0.2 Clustered Quantity: 5 Area: (cm) 12.566 Volume: (cm) 2.513 % Reduction in Area: -50.9% % Reduction in Volume: -201.7% Epithelialization: Small (1-33%) Tunneling: No Undermining: No Wound Description Classification: Category/Stage III Wound Margin: Flat and Intact Exudate Amount: Large Exudate Type: Serosanguineous Exudate Color: red, brown Foul Odor After Cleansing: No Slough/Fibrino Yes Wound Bed Granulation Amount: Medium (34-66%) Exposed Structure Granulation Quality: Pink Fascia Exposed: No Necrotic Amount: Medium (34-66%) Fat Layer (Subcutaneous Tissue) Exposed: Yes Necrotic Quality: Adherent Slough Tendon Exposed: No Muscle Exposed: No Joint Exposed: No Bone Exposed: No Electronic Signature(s) Signed: 03/31/2021 4:45:09 PM By: Rhae Hammock RN Signed: 04/12/2021 2:37:05 PM By: Sandre Kitty Entered By: Sandre Kitty on 03/31/2021 13:26:02 -------------------------------------------------------------------------------- Vitals Details Patient Name: Date of Service: [name] 03/31/2021 1:15 PM Medical Record Number: [id number] Patient Account Number: [id number] Date of Birth/Sex: Treating RN: [birth year] (65 y.o. [name], [name] Primary Care Carlin Attridge: [name] Other Clinician: Referring Heinz Eckert: Treating Jarone Ostergaard/Extender: Drucilla Schmidt Weeks in Treatment: 23 Vital Signs Time Taken: 13:09 Temperature (F): 98 Height (in): 63 Pulse (bpm): 87 Weight (lbs): 142 Respiratory Rate (breaths/min): 17 Body Mass Index (BMI): 25.2 Blood Pressure (mmHg): 103/68 Reference Range: 80 - 120 mg / dl Electronic Signature(s) Signed: 03/31/2021 4:45:09 PM By: Rhae Hammock RN Entered By: Rhae Hammock on 03/31/2021 13:09:46

## 2021-04-14 ENCOUNTER — Other Ambulatory Visit: Payer: Self-pay

## 2021-04-14 ENCOUNTER — Other Ambulatory Visit (HOSPITAL_BASED_OUTPATIENT_CLINIC_OR_DEPARTMENT_OTHER): Payer: Self-pay | Admitting: Internal Medicine

## 2021-04-14 ENCOUNTER — Encounter (HOSPITAL_BASED_OUTPATIENT_CLINIC_OR_DEPARTMENT_OTHER): Payer: Medicare Other | Admitting: Internal Medicine

## 2021-04-14 DIAGNOSIS — L89319 Pressure ulcer of right buttock, unspecified stage: Secondary | ICD-10-CM | POA: Diagnosis not present

## 2021-04-14 DIAGNOSIS — L89154 Pressure ulcer of sacral region, stage 4: Secondary | ICD-10-CM | POA: Diagnosis not present

## 2021-04-14 DIAGNOSIS — L03818 Cellulitis of other sites: Secondary | ICD-10-CM | POA: Diagnosis not present

## 2021-04-14 DIAGNOSIS — I890 Lymphedema, not elsewhere classified: Secondary | ICD-10-CM | POA: Diagnosis not present

## 2021-04-14 DIAGNOSIS — I10 Essential (primary) hypertension: Secondary | ICD-10-CM | POA: Diagnosis not present

## 2021-04-14 DIAGNOSIS — E119 Type 2 diabetes mellitus without complications: Secondary | ICD-10-CM | POA: Diagnosis not present

## 2021-04-14 DIAGNOSIS — Z7984 Long term (current) use of oral hypoglycemic drugs: Secondary | ICD-10-CM | POA: Diagnosis not present

## 2021-04-14 DIAGNOSIS — G35 Multiple sclerosis: Secondary | ICD-10-CM | POA: Diagnosis not present

## 2021-04-19 LAB — AEROBIC/ANAEROBIC CULTURE W GRAM STAIN (SURGICAL/DEEP WOUND)

## 2021-04-19 NOTE — Progress Notes (Signed)
ANEITA, KIGER ([id number]) Visit Report for 04/14/2021 Arrival Information Details Patient Name: Date of Service: Jocelyn Sanchez [name] 04/14/2021 1:15 PM Medical Record Number: [id number] Patient Account Number: [id number] Date of Birth/Sex: Treating RN: [birth year] (65 y.o. Jocelyn Sanchez, Jocelyn Sanchez Primary Care Jocelyn Sanchez: Jocelyn Sanchez Other Clinician: Referring Demarion Pondexter: Treating Jocelyn Sanchez/Extender: Drucilla Schmidt Weeks in Treatment: 25 Visit Information History Since Last Visit Added or deleted any medications: No Patient Arrived: Wheel Chair Any new allergies or adverse reactions: No Arrival Time: 13:44 Had a fall or experienced change in No Accompanied By: self activities of daily living that may affect Transfer Assistance: Harrel Lemon Lift risk of falls: Patient Identification Verified: Yes Signs or symptoms of abuse/neglect since last visito No Secondary Verification Process Completed: Yes Hospitalized since last visit: No Patient Requires Transmission-Based Precautions: No Implantable device outside of the clinic excluding No Patient Has Alerts: Yes cellular tissue based products placed in the center Patient Alerts: Patient on Blood Thinner since last visit: Has Dressing in Place as Prescribed: Yes Pain Present Now: Yes Electronic Signature(s) Signed: 04/14/2021 5:43:20 PM By: Jocelyn Pilling RN, BSN Entered By: Jocelyn Sanchez on 04/14/2021 14:28:43 -------------------------------------------------------------------------------- Encounter Discharge Information Details Patient Name: Date of Service: Jocelyn Sanchez [place] [name] 04/14/2021 1:15 PM Medical Record Number: [id number] Patient Account Number: [id number] Date of Birth/Sex: Treating RN: 12-08-1955 (65 y.o. Jocelyn Sanchez Primary Care Jocelyn Sanchez: Jocelyn Sanchez Other Clinician: Referring Taysom Glymph: Treating Love Chowning/Extender: Drucilla Schmidt Weeks in Treatment: 25 Encounter Discharge Information  Items Post Procedure Vitals Discharge Condition: Stable Temperature (F): 98.2 Ambulatory Status: Wheelchair Pulse (bpm): 120 Discharge Destination: Home Respiratory Rate (breaths/min): 16 Transportation: Private Auto Blood Pressure (mmHg): 105/72 Accompanied By: self Schedule Follow-up Appointment: Yes Clinical Summary of Care: Electronic Signature(s) Signed: 04/14/2021 5:43:20 PM By: Jocelyn Pilling RN, BSN Entered By: Jocelyn Sanchez on 04/14/2021 14:29:09 -------------------------------------------------------------------------------- Lower Extremity Assessment Details Patient Name: Date of Service: Jocelyn Sanchez [name] 04/14/2021 1:15 PM Medical Record Number: [id number] Patient Account Number: [id number] Date of Birth/Sex: Treating RN: May 05, 1956 (65 y.o. Jocelyn Sanchez Primary Care Biff Rutigliano: Jocelyn Sanchez Other Clinician: Referring Lavone Barrientes: Treating Korinne Greenstein/Extender: Drucilla Schmidt Weeks in Treatment: 25 Electronic Signature(s) Signed: 04/14/2021 5:43:20 PM By: Jocelyn Pilling RN, BSN Entered By: Jocelyn Sanchez on 04/14/2021 13:46:42 -------------------------------------------------------------------------------- Multi Wound Chart Details Patient Name: Date of Service: Jocelyn Sanchez [name] 04/14/2021 1:15 PM Medical Record Number: [id number] Patient Account Number: [id number] Date of Birth/Sex: Treating RN: [birth year] (65 y.o. Jocelyn Sanchez, Jocelyn Sanchez Primary Care Kourosh Jablonsky: Jocelyn Sanchez Other Clinician: Referring Rianna Lukes: Treating Kaityln Kallstrom/Extender: Drucilla Schmidt Weeks in Treatment: 25 Vital Signs Height(in): 15 Pulse(bpm): 120 Weight(lbs): 142 Blood Pressure(mmHg): 105/72 Body Mass Index(BMI): 25 Temperature(F): 98.2 Respiratory Rate(breaths/min): 16 Photos: [4:No Photos] [N/A:N/A] Sacrum Right Gluteus N/A Wound Location: Pressure Injury Pressure Injury N/A Wounding Event: Pressure Ulcer Pressure Ulcer N/A Primary  Etiology: Anemia, Deep Vein Thrombosis, Anemia, Deep Vein Thrombosis, N/A Comorbid History: Hypertension, Peripheral Venous Hypertension, Peripheral Venous Disease, Type II Diabetes Disease, Type II Diabetes 07/20/2020 12/30/2020 N/A Date Acquired: 25 15 N/A Weeks of Treatment: Open Converted N/A Wound Status: No Yes N/A Clustered Wound: 5.5x6x2 5.5x6x2 N/A Measurements L x W x D (cm) 25.918 25.918 N/A A (cm) : rea 51.836 51.836 N/A Volume (cm) : -340.00% -211.30% N/A % Reduction in A rea: -486.60% -6122.80% N/A % Reduction in Volume: 11 Starting Position 1 (o'clock): 2 Ending Position 1 (o'clock): 2.7  Maximum Distance 1 (cm): Yes N/A N/A Undermining: Category/Stage IV Category/Stage III N/A Classification: Large Large N/A Exudate A mount: Purulent Serosanguineous N/A Exudate Type: yellow, brown, green red, brown N/A Exudate Color: Well defined, not attached N/A N/A Wound Margin: Large (67-100%) N/A N/A Granulation Amount: Red, Pink, Friable N/A N/A Granulation Quality: Small (1-33%) N/A N/A Necrotic Amount: Fat Layer (Subcutaneous Tissue): Yes N/A N/A Exposed Structures: Muscle: Yes Bone: Yes Fascia: No Tendon: No Joint: No None N/A N/A Epithelialization: Debridement - Excisional N/A N/A Debridement: Pre-procedure Verification/Time Out 14:15 N/A N/A Taken: Other N/A N/A Pain Control: Bone N/A N/A Tissue Debrided: Skin/Subcutaneous N/A N/A Level: Tissue/Muscle/Bone 1 N/A N/A Debridement A (sq cm): rea Jocelyn Sanchez N/A N/A Instrument: Swab N/A N/A Specimen: 2 N/A N/A Number of Specimens Taken: Moderate N/A N/A Bleeding: Gel Foam N/A N/A Hemostasis Achieved: 0 N/A N/A Procedural Pain: 0 N/A N/A Post Procedural Pain: Debridement Treatment Response: Procedure was tolerated well N/A N/A Post Debridement Measurements L x 5.5x6x2 N/A N/A W x D (cm) 51.836 N/A N/A Post Debridement Volume: (cm) Category/Stage IV N/A N/A Post Debridement  Stage: Converted #4 to #3 wound. converted #4 to #3 wound. N/A Assessment Notes: Debridement N/A N/A Procedures Performed: Treatment Notes Wound #3 (Sacrum) Cleanser Soap and Water Discharge Instruction: May shower and wash wound with dial antibacterial soap and water prior to dressing change. Wound Cleanser Discharge Instruction: Cleanse the wound with wound cleanser prior to applying a clean dressing using gauze sponges, not tissue or cotton balls. Peri-Wound Care Skin Prep Discharge Instruction: Use skin prep as directed Topical Primary Dressing KerraCel Ag Gelling Fiber Dressing, 4x5 in (silver alginate) Discharge Instruction: Apply silver alginate to wound bed as instructed Pack with saline moistened gauze on top of prisma Secondary Dressing Woven Gauze Sponge, Non-Sterile 4x4 in Discharge Instruction: Apply over primary dressing as directed. MPM Excel SAP Bordered Dressing, 7x6.7 (Sacral) (in/in) Discharge Instruction: Apply silicone border over primary dressing as directed. Secured With Compression Wrap Compression Stockings Add-Ons Wound #4 (Gluteus) Wound Laterality: Right Cleanser Peri-Wound Care Topical Primary Dressing Secondary Dressing Secured With Compression Wrap Compression Stockings Add-Ons Electronic Signature(s) Signed: 04/14/2021 5:43:20 PM By: Jocelyn Pilling RN, BSN Signed: 04/19/2021 4:29:54 PM By: Linton Ham MD Entered By: Linton Ham on 04/14/2021 14:56:04 -------------------------------------------------------------------------------- Multi-Disciplinary Care Plan Details Patient Name: Date of Service: Jocelyn Sanchez [place] [name] 04/14/2021 1:15 PM Medical Record Number: [id number] Patient Account Number: [id number] Date of Birth/Sex: Treating RN: January 18, 1956 (65 y.o. Jocelyn Sanchez, Jocelyn Sanchez Primary Care Marshay Slates: Jocelyn Sanchez Other Clinician: Referring Kensi Karr: Treating Ameir Faria/Extender: Alycia Rossetti in Treatment:  25 Multidisciplinary Care Plan reviewed with physician Active Inactive Pressure Nursing Diagnoses: Knowledge deficit related to causes and risk factors for pressure ulcer development Knowledge deficit related to management of pressures ulcers Potential for impaired tissue integrity related to pressure, friction, moisture, and shear Goals: Patient/caregiver will verbalize understanding of pressure ulcer management Date Initiated: 03/17/2021 Target Resolution Date: 05/13/2021 Goal Status: Active Interventions: Assess: immobility, friction, shearing, incontinence upon admission and as needed Assess offloading mechanisms upon admission and as needed Assess potential for pressure ulcer upon admission and as needed Provide education on pressure ulcers Treatment Activities: Pressure reduction/relief device ordered : 03/17/2021 Notes: Wound/Skin Impairment Nursing Diagnoses: Impaired tissue integrity Knowledge deficit related to ulceration/compromised skin integrity Goals: Patient will have a decrease in wound volume by X% from date: (specify in notes) Date Initiated: 10/19/2020 Date Inactivated: 03/17/2021 Target Resolution Date: 02/18/2021 Goal Status: Unmet Unmet Reason: glut  wound deteriorating Patient/caregiver will verbalize understanding of skin care regimen Date Initiated: 10/19/2020 Target Resolution Date: 04/29/2021 Goal Status: Active Ulcer/skin breakdown will have a volume reduction of 30% by week 4 Date Initiated: 10/19/2020 Date Inactivated: 12/09/2020 Target Resolution Date: 11/19/2020 Unmet Reason: see wound Goal Status: Unmet measurements. Ulcer/skin breakdown will have a volume reduction of 50% by week 8 Date Initiated: 10/19/2020 Date Inactivated: 12/30/2020 Target Resolution Date: 12/16/2020 Unmet Reason: see wound Goal Status: Unmet measurements. Interventions: Assess patient/caregiver ability to obtain necessary supplies Assess patient/caregiver ability to perform  ulcer/skin care regimen upon admission and as needed Assess ulceration(s) every visit Provide education on ulcer and skin care Notes: Electronic Signature(s) Signed: 04/14/2021 5:43:20 PM By: Jocelyn Pilling RN, BSN Entered By: Jocelyn Sanchez on 04/14/2021 14:05:59 -------------------------------------------------------------------------------- Pain Assessment Details Patient Name: Date of Service: Jocelyn Sanchez [name] 04/14/2021 1:15 PM Medical Record Number: [id number] Patient Account Number: [id number] Date of Birth/Sex: Treating RN: [birth year] (65 y.o. Jocelyn Sanchez Primary Care Christerpher Clos: Jocelyn Sanchez Other Clinician: Referring Abdulla Pooley: Treating Yasmen Cortner/Extender: Drucilla Schmidt Weeks in Treatment: 25 Active Problems Location of Pain Severity and Description of Pain Patient Has Paino Yes Site Locations Pain Location: Pain in Ulcers Rate the pain. Current Pain Level: 6 Pain Management and Medication Current Pain Management: Medication: No Cold Application: No Rest: No Massage: No Activity: No T.E.N.S.: No Heat Application: No Leg drop or elevation: No Is the Current Pain Management Adequate: Adequate How does your wound impact your activities of daily livingo Sleep: No Bathing: No Appetite: No Relationship With Others: No Bladder Continence: No Emotions: No Bowel Continence: No Work: No Toileting: No Drive: No Dressing: No Hobbies: No Engineer, maintenance) Signed: 04/14/2021 5:43:20 PM By: Jocelyn Pilling RN, BSN Entered By: Jocelyn Sanchez on 04/14/2021 13:46:37 -------------------------------------------------------------------------------- Patient/Caregiver Education Details Patient Name: Date of Service: Jocelyn Sanchez [name] 10/27/2022andnbsp1:15 PM Medical Record Number: [id number] Patient Account Number: [id number] Date of Birth/Gender: Treating RN: 08-09-1955 (65 y.o. Jocelyn Sanchez Primary Care Physician: Jocelyn Sanchez Other Clinician: Referring Physician: Treating Physician/Extender: Alycia Rossetti in Treatment: 25 Education Assessment Education Provided To: Patient Education Topics Provided Pressure: Handouts: Pressure Ulcers: Care and Offloading, Pressure Ulcers: Care and Offloading 2, Preventing Pressure Ulcers Methods: Explain/Verbal, Printed Responses: Reinforcements needed Electronic Signature(s) Signed: 04/14/2021 5:43:20 PM By: Jocelyn Pilling RN, BSN Entered By: Jocelyn Sanchez on 04/14/2021 14:06:13 -------------------------------------------------------------------------------- Wound Assessment Details Patient Name: Date of Service: Jocelyn Sanchez [name] 04/14/2021 1:15 PM Medical Record Number: [id number] Patient Account Number: [id number] Date of Birth/Sex: Treating RN: 09-26-1955 (65 y.o. Jocelyn Sanchez, Jocelyn Sanchez Primary Care Jamya Starry: Jocelyn Sanchez Other Clinician: Referring Oneal Biglow: Treating Johnross Nabozny/Extender: Drucilla Schmidt Weeks in Treatment: 25 Wound Status Wound Number: 3 Primary Pressure Ulcer Etiology: Wound Location: Sacrum Wound Open Wounding Event: Pressure Injury Status: Date Acquired: 07/20/2020 Comorbid Anemia, Deep Vein Thrombosis, Hypertension, Peripheral Weeks Of Treatment: 25 History: Venous Disease, Type II Diabetes Clustered Wound: No Photos Wound Measurements Length: (cm) 5.5 Width: (cm) 6 Depth: (cm) 2 Area: (cm) 25.918 Volume: (cm) 51.836 % Reduction in Area: -340% % Reduction in Volume: -486.6% Epithelialization: None Tunneling: No Undermining: Yes Starting Position (o'clock): 11 Ending Position (o'clock): 2 Maximum Distance: (cm) 2.7 Wound Description Classification: Category/Stage IV Wound Margin: Well defined, not attached Exudate Amount: Large Exudate Type: Purulent Exudate Color: yellow, brown, green Foul Odor After Cleansing: No Slough/Fibrino Yes Wound Bed Granulation Amount: Large  (67-100%) Exposed Structure  Granulation Quality: Red, Pink, Friable Fascia Exposed: No Necrotic Amount: Small (1-33%) Fat Layer (Subcutaneous Tissue) Exposed: Yes Necrotic Quality: Adherent Slough Tendon Exposed: No Muscle Exposed: Yes Necrosis of Muscle: No Joint Exposed: No Bone Exposed: Yes Assessment Notes Converted #4 to #3 wound. Treatment Notes Wound #3 (Sacrum) Cleanser Soap and Water Discharge Instruction: May shower and wash wound with dial antibacterial soap and water prior to dressing change. Wound Cleanser Discharge Instruction: Cleanse the wound with wound cleanser prior to applying a clean dressing using gauze sponges, not tissue or cotton balls. Peri-Wound Care Skin Prep Discharge Instruction: Use skin prep as directed Topical Primary Dressing KerraCel Ag Gelling Fiber Dressing, 4x5 in (silver alginate) Discharge Instruction: Apply silver alginate to wound bed as instructed Pack with saline moistened gauze on top of prisma Secondary Dressing Woven Gauze Sponge, Non-Sterile 4x4 in Discharge Instruction: Apply over primary dressing as directed. MPM Excel SAP Bordered Dressing, 7x6.7 (Sacral) (in/in) Discharge Instruction: Apply silicone border over primary dressing as directed. Secured With Compression Wrap Compression Stockings Environmental education officer) Signed: 04/14/2021 5:43:20 PM By: Jocelyn Pilling RN, BSN Entered By: Jocelyn Sanchez on 04/14/2021 14:07:18 -------------------------------------------------------------------------------- Wound Assessment Details Patient Name: Date of Service: Jocelyn Sanchez [name] 04/14/2021 1:15 PM Medical Record Number: [id number] Patient Account Number: [id number] Date of Birth/Sex: Treating RN: 1955/09/27 (65 y.o. Jocelyn Sanchez, Jocelyn Sanchez Primary Care Angelo Prindle: Jocelyn Sanchez Other Clinician: Referring Tabatha Razzano: Treating Lorel Lembo/Extender: Drucilla Schmidt Weeks in Treatment: 25 Wound Status Wound  Number: 4 Primary Pressure Ulcer Etiology: Wound Location: Right Gluteus Wound Converted Wounding Event: Pressure Injury Status: Date Acquired: 12/30/2020 Comorbid Anemia, Deep Vein Thrombosis, Hypertension, Peripheral Weeks Of Treatment: 15 History: Venous Disease, Type II Diabetes Clustered Wound: Yes Wound Measurements Length: (cm) 5.5 Width: (cm) 6 Depth: (cm) 2 Area: (cm) 25.918 Volume: (cm) 51.836 % Reduction in Area: -211.3% % Reduction in Volume: -6122.8% Wound Description Classification: Category/Stage III Exudate Amount: Large Exudate Type: Serosanguineous Exudate Color: red, brown Assessment Notes converted #4 to #3 wound. Treatment Notes Wound #4 (Gluteus) Wound Laterality: Right Cleanser Peri-Wound Care Topical Primary Dressing Secondary Dressing Secured With Compression Wrap Compression Stockings Add-Ons Electronic Signature(s) Signed: 04/14/2021 5:43:20 PM By: Jocelyn Pilling RN, BSN Entered By: Jocelyn Sanchez on 04/14/2021 14:04:44 -------------------------------------------------------------------------------- Vitals Details Patient Name: Date of Service: Jocelyn Sanchez [name] 04/14/2021 1:15 PM Medical Record Number: [id number] Patient Account Number: [id number] Date of Birth/Sex: Treating RN: [birth year] (65 y.o. Jocelyn Sanchez, Jocelyn Sanchez Primary Care Breanna Shorkey: Jocelyn Sanchez Other Clinician: Referring Cosby Proby: Treating Moreen Piggott/Extender: Drucilla Schmidt Weeks in Treatment: 25 Vital Signs Time Taken: 13:45 Temperature (F): 98.2 Height (in): 63 Pulse (bpm): 120 Weight (lbs): 142 Respiratory Rate (breaths/min): 16 Body Mass Index (BMI): 25.2 Blood Pressure (mmHg): 105/72 Reference Range: 80 - 120 mg / dl Electronic Signature(s) Signed: 04/14/2021 5:43:20 PM By: Jocelyn Pilling RN, BSN Entered By: Jocelyn Sanchez on 04/14/2021 13:46:26

## 2021-04-19 NOTE — Progress Notes (Signed)
ITA, FRITZSCHE ([id number]) Visit Report for 04/14/2021 Debridement Details Patient Name: Date of Service: Jocelyn Sanchez [name] 04/14/2021 1:15 PM Medical Record Number: [id number] Patient Account Number: [id number] Date of Birth/Sex: Treating RN: [birth year] (65 y.o. Jocelyn Sanchez, Jocelyn Sanchez Primary Care Provider: [name] Other Clinician: Referring Provider: Treating Provider/Extender: Drucilla Schmidt Weeks in Treatment: 25 Debridement Performed for Assessment: Wound #3 Sacrum Performed By: Physician Ricard Dillon., MD Debridement Type: Debridement Level of Consciousness (Pre-procedure): Awake and Alert Pre-procedure Verification/Time Out Yes - 14:15 Taken: Start Time: 14:16 Pain Control: Other : Benzocaine 20% T Area Debrided (L x W): otal 1 (cm) x 1 (cm) = 1 (cm) Tissue and other material debrided: Viable, Bone Level: Skin/Subcutaneous Tissue/Muscle/Bone Debridement Description: Excisional Instrument: Rongeur Specimen: Swab, Tissue Culture Number of Specimens T aken: 2 Bleeding: Moderate Hemostasis Achieved: Gel Foam End Time: 14:33 Procedural Pain: 0 Post Procedural Pain: 0 Response to Treatment: Procedure was tolerated well Level of Consciousness (Post- Awake and Alert procedure): Post Debridement Measurements of Total Wound Length: (cm) 5.5 Stage: Category/Stage IV Width: (cm) 6 Depth: (cm) 2 Volume: (cm) 51.836 Character of Wound/Ulcer Post Debridement: Stable Post Procedure Diagnosis Same as Pre-procedure Electronic Signature(s) Signed: 04/14/2021 5:43:20 PM By: Deon Pilling RN, BSN Signed: 04/19/2021 4:29:54 PM By: Linton Ham MD Entered By: Linton Ham on 04/14/2021 14:56:20 -------------------------------------------------------------------------------- HPI Details Patient Name: Date of Service: Jocelyn Sanchez [name] 04/14/2021 1:15 PM Medical Record Number: [id number] Patient Account Number: [id number] Date of Birth/Sex:  Treating RN: 01/21/1956 (65 y.o. Jocelyn Sanchez, Jocelyn Sanchez Primary Care Provider: [name] Other Clinician: Referring Provider: Treating Provider/Extender: Drucilla Schmidt Weeks in Treatment: 25 History of Present Illness Location: Midline sacral region Quality: Patient describes this as a soreness Severity: 2 out of 10 Duration: Greater than one year prior to presentation to the wound center Timing: Pain occurs most often with dressing changes Context: Developmental result of pressure over time to the sacral region Modifying Factors: Patient has previously had this wound packed with gauze, she has had a wound VAC, and she tells me other various wound care treatments over the past year. ssociated Signs and Symptoms: Multiple sclerosis, hypertension, diabetes mellitus type 2, lymphedema A HPI Description: 03/27/16 patient presents today on initial evaluation concerning her sacral pressure injury which has been present for roughly one year. She tells me that she does not know of any x-rays or other advanced imaging has been performed in regard to the sacral region. She has been provided with wound care at the [facility] facility at this point in time. She has MS and is not able to move she tells me "at all". She does tell me however that the facility is very good about re-positioning her at least every 2 hours. With that being said she did fell the wound VAC apparently. She had this twice the first time she tells me that it did not seem to be working very well and the second time it actually made things worse this was just 2 months ago. Therefore this was discontinued. at this point in time today she is not having any signs or symptoms of systemic infection she is on a blood thinner which is Eliquis at this point in time. 03/24/16; patient admitted to our clinic by Jeri Cos last week. She is a lady with advanced MS who is had a wound on her lower sacral area for most of  the last year. She reiterates  to me that wound vacs have not worked for her in fact she states they've actually made her wound worse on the last occasion. An x- ray was apparently done in the facility although I don't have these results. 03/31/16; this patient had an x-ray done at the facility that did not show evidence of osteomyelitis. Her bone biopsy did not show osteomyelitis. 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 She had been suggested to have collagen on the base of the wound covered with normal saline wet to dry although her intake nurse reports there was no collagen on the wound surface. 05/15/16 wound is not changed that much. This is a substantial stage IV wound with considerable undermining from 7 to 3:00. I have spoken to the wound care nurse at the [facility] facility, there was no records on what exactly caused the wound VAC to fail or records of the same. 06/05/16; I received communication from [facility] skilled facility reporting that the patient refuses the wound back more often that it's on. When I confronted her with this today in the room she denied that she was responsible stating for the most part the problem is because she has trouble getting them to deal with her Foley catheter properly. I don't exactly follow her line of reasoning here however apparently they've been putting hydrogel wet to dry and in the facility 07/03/16; I have not heard anything from [facility]. The patient states she has not had a wound VAC the last 2-3 days due to "battery problems". She arrives today with wet to dry dressing. She has advanced MS and is a chronic care resident at the skilled facility 07/31/16; patient is a resident of [facility] skilled facility. She has had the VAC applied but did not arrive  with the [facility] in place. Her wound generally looks better perhaps somewhat less tunneling superiorly. There is still some exposed bone however. 09/11/16; I follow this patient with advanced MS who is a resident of [facility] skilled facility on a monthly basis. She arrives 6 weeks after her last visit. The wound VAC apparently is no longer being used according to the patient because the nurses in the facility could not maintain a seal in this area. Via the patient it seems that they're using some form of wet to dry dressing although he received no documentation. The patient states she is eating well she is being turned in the facility. 10/09/16; I follow this patient with advanced MS who is a resident of [facility]. She apparently could not have a wound VAC is the facility could not maintain a seal therefore they're using wet-to-dry dressings however the patient arrives today with better measurements and a nicely granulated wound bed. 11/05/16; patient with advanced MS was a pressure area on the lower sacrum/coccyx. She could not have a wound VAC because the facility could n/ot maintain a seal. I ordered Prisma last time. Wound dimensions are about the same per intake 12/04/16; pressure area on the lower sacrum. using collagen. Has a new concern on the right great toe 01/01/17; patient has now only a small open area on the lower sacrumcoccyx she is been doing well and this wound is gradually progressing towards closure. The area on the right great toe that looked to me like a paronychia last time is also quite a bit better  although the nail here looks as though it's loosening READMISSION 10/19/2020 This is a patient with advanced multiple sclerosis. We had her in clinic for a prolonged period in late 2017 to the summer 2018 with a stage IV wound on her lower sacrum/coccyx. I do not think we healed her out although her wound was a lot better. She was a resident at [facility] facility at the time The  patient states the wound reopened about 3 or 4 months ago. I am not completely certain from talking to the patient if this is in the same position as last time or even indeed if it ever closed. 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 She has a home health aide who comes twice a day. She had an air mattress but apparently it does not work As far as I can tell there is not been any major change in her medical problem list except she lives at home now. She is a diabetic on metformin 6/23; patient has not been here in about 6-7 weeks. I am not sure what the issue is and the patient does not seem to really know. She takes scat for transportation although that has not been a problem before. We changed her dressing last time she was here to silver collagen moistened with the backing wet- to-dry and a foam border. Her wound is quite a bit better looking. 7/14; 3-week follow-up. The patient's wound is not as good as last time. It is wider I think more undermining superiorly. Also there appears to be superficial skin breakdown superiorly and the skin around the wound and also from about 2-4 o'clock. Not a particularly viable surface. We have been using silver collagen and backing wet-to-dry. The patient lives in her own home. She apparently has care attendance for almost all of the day. She has a surface on her bed but this is clearly not a level 3. She also has home health. She claims to be eating well 8/4; patient presents for 3-week follow-up. She has no issues or complaints today. She reports using wet-to-dry dressings for her sacral wound and collagen to the buttocks wound. She states that her new mattress is arriving today 9/29; its been almost 2 months since the patient was in the clinic. She has a sacral wound which is worse I think with exposed bone to the right under the undermining area.  Also concerning is a marked deterioration of her left buttock. With a superficial necrotic area and several small superficial areas. She has been using silver collagen wet-to-dry. The patient has a level 2 surface certainly would benefit from a level 3 if that can be provided. She is relatively immobile cannot turn her self secondary to multiple sclerosis. She tells me she has an aide for most of the day except for 4 hours. She also states she is not up much in a wheelchair. I wonder about her oral intake although she says she eats 3 meals a day and takes Glucerna 10/13; the patient looks somewhat better than 2 weeks ago today. I gave her empiric Augmentin which she took a mobile x-ray was negative for osteomyelitis but they suggested a bone scan. The wound area actually looks somewhat better today 10/27; comes in today with exposed bone complaints of more pain and odor. She looks as though she is losing weight although she claims  to be eating well. She takes Environmental health practitioner) Signed: 04/19/2021 4:29:54 PM By: Linton Ham MD Entered By: Linton Ham on 04/14/2021 14:57:21 -------------------------------------------------------------------------------- Physical Exam Details Patient Name: Date of Service: Jocelyn Sanchez [name] 04/14/2021 1:15 PM Medical Record Number: [id number] Patient Account Number: [id number] Date of Birth/Sex: Treating RN: Mar 27, 1956 (65 y.o. Jocelyn Sanchez Primary Care Provider: [name] Other Clinician: Referring Provider: Treating Provider/Extender: Drucilla Schmidt Weeks in Treatment: 25 Constitutional Sitting or standing Blood Pressure is within target range for patient.. Pulse regular and within target range for patient.Marland Kitchen Respirations regular, non-labored and within target range.. Temperature is normal and within the target range for the patient.Marland Kitchen Appears in no distress. Notes Wound exam; open area of exposed bone today.  Given her complaints of pain and odor I went ahead and biopsied the bone using rongeurs. Also specimen for culture. Hemostasis with direct pressure and [place] Electronic Signature(s) Signed: 04/19/2021 4:29:54 PM By: Linton Ham MD Entered By: Linton Ham on 04/14/2021 15:01:20 -------------------------------------------------------------------------------- Physician Orders Details Patient Name: Date of Service: Jocelyn Sanchez [name] 04/14/2021 1:15 PM Medical Record Number: [id number] Patient Account Number: [id number] Date of Birth/Sex: Treating RN: [birth year] (65 y.o. Jocelyn Sanchez, Jocelyn Sanchez Primary Care Provider: [name] Other Clinician: Referring Provider: Treating Provider/Extender: Drucilla Schmidt Weeks in Treatment: 25 Verbal / Phone Orders: No Diagnosis Coding ICD-10 Coding Code Description L89.154 Pressure ulcer of sacral region, stage 4 G35 Multiple sclerosis L89.319 Pressure ulcer of right buttock, unspecified stage L03.818 Cellulitis of other sites Follow-up Appointments ppointment in 2 weeks. - Dr. Dellia Nims ***60 minutes extra time hoyer**** Return A Someone will call you with results of the culture and biopsy when the results are in. Bathing/ Shower/ Hygiene May shower with protection but do not get wound dressing(s) wet. - May shower and use soap and water on days dressings are changed Off-Loading A fluidized (Group 3) mattress - arriving 04/20/2021. ir Turn and reposition every 2 hours [facility] wound care orders this week; continue Home Health for wound care. May utilize formulary equivalent dressing for wound treatment orders unless otherwise specified. - change dressing to silver alginate Dressing changes to be completed by [facility] on Monday / Wednesday / Friday except when patient has scheduled visit at [facility]. Other Home Health Orders/Instructions: - [facility] Wound Treatment Wound #3 - Sacrum Cleanser: Soap and  Water [facility]) Every Other Day/30 Days Discharge Instructions: May shower and wash wound with dial antibacterial soap and water prior to dressing change. Cleanser: Wound Cleanser [facility]) Every Other Day/30 Days Discharge Instructions: Cleanse the wound with wound cleanser prior to applying a clean dressing using gauze sponges, not tissue or cotton balls. Peri-Wound Care: Skin Prep [facility]) Every Other Day/30 Days Discharge Instructions: Use skin prep as directed Prim Dressing: KerraCel Ag Gelling Fiber Dressing, 4x5 in (silver alginate) (Home Health) Every Other Day/30 Days ary Discharge Instructions: Apply silver alginate to wound bed as instructed Prim Dressing: Pack with saline moistened gauze on top of prisma ([place]) Every Other Day/30 Days ary Secondary Dressing: Woven Gauze Sponge, Non-Sterile 4x4 in ([place]) Every Other Day/30 Days Discharge Instructions: Apply over primary dressing as directed. Secondary Dressing: MPM Excel SAP Bordered Dressing, 7x6.7 (Sacral) (in/in) (Home Health) Every Other Day/30 Days Discharge Instructions: Apply silicone border over primary dressing as directed. Laboratory Bacteria identified in Tissue by Biopsy culture (MICRO) - (ICD10 L89.154 - Pressure ulcer of sacral  region, stage 4) LOINC Code: [phone] Convenience Name: Biopsy specimen culture naerobe culture (MICRO) - (ICD10 L89.154 - Pressure ulcer of sacral region, stage 4) Bacteria identified in Unspecified specimen by A LOINC Code: 443-1 Convenience Name: Anerobic culture Electronic Signature(s) Signed: 04/14/2021 5:43:20 PM By: Deon Pilling RN, BSN Signed: 04/19/2021 4:29:54 PM By: Linton Ham MD Entered By: Deon Pilling on 04/14/2021 14:27:36 -------------------------------------------------------------------------------- Problem List Details Patient Name: Date of Service: Jocelyn Sanchez [name] 04/14/2021 1:15 PM Medical Record Number: [id number] Patient  Account Number: [id number] Date of Birth/Sex: Treating RN: Aug 10, 1955 (65 y.o. Jocelyn Sanchez, Jocelyn Sanchez Primary Care Provider: [name] Other Clinician: Referring Provider: Treating Provider/Extender: Drucilla Schmidt Weeks in Treatment: 25 Active Problems ICD-10 Encounter Code Description Active Date MDM Diagnosis L89.154 Pressure ulcer of sacral region, stage 4 10/19/2020 No Yes G35 Multiple sclerosis 10/19/2020 No Yes L89.319 Pressure ulcer of right buttock, unspecified stage 03/17/2021 No Yes L03.818 Cellulitis of other sites 03/17/2021 No Yes Inactive Problems Resolved Problems Electronic Signature(s) Signed: 04/19/2021 4:29:54 PM By: Linton Ham MD Entered By: Linton Ham on 04/14/2021 14:55:55 -------------------------------------------------------------------------------- Progress Note Details Patient Name: Date of Service: Jocelyn Sanchez [name] 04/14/2021 1:15 PM Medical Record Number: [id number] Patient Account Number: [id number] Date of Birth/Sex: Treating RN: 10-Apr-1956 (65 y.o. Jocelyn Sanchez, Jocelyn Sanchez Primary Care Provider: [name] Other Clinician: Referring Provider: Treating Provider/Extender: Drucilla Schmidt Weeks in Treatment: 25 Subjective History of Present Illness (HPI) The following HPI elements were documented for the patient's wound: Location: Midline sacral region Quality: Patient describes this as a soreness Severity: 2 out of 10 Duration: Greater than one year prior to presentation to the wound center Timing: Pain occurs most often with dressing changes Context: Developmental result of pressure over time to the sacral region Modifying Factors: Patient has previously had this wound packed with gauze, she has had a wound VAC, and she tells me other various wound care treatments over the past year. Associated Signs and Symptoms: Multiple sclerosis, hypertension, diabetes mellitus type 2, lymphedema 03/27/16 patient  presents today on initial evaluation concerning her sacral pressure injury which has been present for roughly one year. She tells me that she does not know of any x-rays or other advanced imaging has been performed in regard to the sacral region. She has been provided with wound care at the [facility] facility at this point in time. She has MS and is not able to move she tells me "at all". She does tell me however that the facility is very good about re-positioning her at least every 2 hours. With that being said she did fell the wound VAC apparently. She had this twice the first time she tells me that it did not seem to be working very well and the second time it actually made things worse this was just 2 months ago. Therefore this was discontinued. at this point in time today she is not having any signs or symptoms of systemic infection she is on a blood thinner which is Eliquis at this point in time. 03/24/16; patient admitted to our clinic by Jeri Cos last week. She is a lady with advanced MS who is had a wound on her lower sacral area for most of the last year. She reiterates to me that wound vacs have not worked for her in fact she states they've actually made her wound worse on the last occasion. An x- ray was apparently done in the facility although  I don't have these results. 03/31/16; this patient had an x-ray done at the facility that did not show evidence of osteomyelitis. Her bone biopsy did not show osteomyelitis. 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 She had been suggested to have collagen on the base of the wound covered with normal saline wet to dry although her intake nurse reports there was no collagen on the wound surface. 05/15/16 wound is not changed that much. This is a  substantial stage IV wound with considerable undermining from 7 to 3:00. I have spoken to the wound care nurse at the [facility] facility, there was no records on what exactly caused the wound VAC to fail or records of the same. 06/05/16; I received communication from [facility] skilled facility reporting that the patient refuses the wound back more often that it's on. When I confronted her with this today in the room she denied that she was responsible stating for the most part the problem is because she has trouble getting them to deal with her Foley catheter properly. I don't exactly follow her line of reasoning here however apparently they've been putting hydrogel wet to dry and in the facility 07/03/16; I have not heard anything from [facility]. The patient states she has not had a wound VAC the last 2-3 days due to "battery problems". She arrives today with wet to dry dressing. She has advanced MS and is a chronic care resident at the skilled facility 07/31/16; patient is a resident of [facility] skilled facility. She has had the VAC applied but did not arrive with the [facility] in place. Her wound generally looks better perhaps somewhat less tunneling superiorly. There is still some exposed bone however. 09/11/16; I follow this patient with advanced MS who is a resident of [facility] skilled facility on a monthly basis. She arrives 6 weeks after her last visit. The wound VAC apparently is no longer being used according to the patient because the nurses in the facility could not maintain a seal in this area. Via the patient it seems that they're using some form of wet to dry dressing although he received no documentation. The patient states she is eating well she is being turned in the facility. 10/09/16; I follow this patient with advanced MS who is a resident of [facility]. She apparently could not have a wound VAC is the facility could not maintain a seal therefore they're using wet-to-dry dressings  however the patient arrives today with better measurements and a nicely granulated wound bed. 11/05/16; patient with advanced MS was a pressure area on the lower sacrum/coccyx. She could not have a wound VAC because the facility could n/ot maintain a seal. I ordered Prisma last time. Wound dimensions are about the same per intake 12/04/16; pressure area on the lower sacrum. using collagen. Has a new concern on the right great toe 01/01/17; patient has now only a small open area on the lower sacrumoococcyx she is been doing well and this wound is gradually progressing towards closure. The area on the right great toe that looked to me like a paronychia last time is also quite a bit better although the nail here looks as though it's loosening READMISSION 10/19/2020 This is a patient with advanced multiple sclerosis. We had her in clinic for a prolonged period in late 2017 to the  summer 2018 with a stage IV wound on her lower sacrum/coccyx. I do not think we healed her out although her wound was a lot better. She was a resident at [facility] skilled facility at the time The patient states the wound reopened about 3 or 4 months ago. I am not completely certain from talking to the patient if this is in the same position as last time or even indeed if it ever closed. 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 She has a home health aide who comes twice a day. She had an air mattress but apparently it does not work As far as I can tell there is not been any major change in her medical problem list except she lives at home now. She is a diabetic on metformin 6/23; patient has not been here in about 6-7 weeks. I am not sure what the issue is and the patient does not seem to really know. She takes scat for transportation although that has not been a problem before. We changed her dressing last time she was  here to silver collagen moistened with the backing wet- to-dry and a foam border. Her wound is quite a bit better looking. 7/14; 3-week follow-up. The patient's wound is not as good as last time. It is wider I think more undermining superiorly. Also there appears to be superficial skin breakdown superiorly and the skin around the wound and also from about 2-4 o'clock. Not a particularly viable surface. We have been using silver collagen and backing wet-to-dry. The patient lives in her own home. She apparently has care attendance for almost all of the day. She has a surface on her bed but this is clearly not a level 3. She also has home health. She claims to be eating well 8/4; patient presents for 3-week follow-up. She has no issues or complaints today. She reports using wet-to-dry dressings for her sacral wound and collagen to the buttocks wound. She states that her new mattress is arriving today 9/29; its been almost 2 months since the patient was in the clinic. She has a sacral wound which is worse I think with exposed bone to the right under the undermining area. Also concerning is a marked deterioration of her left buttock. With a superficial necrotic area and several small superficial areas. She has been using silver collagen wet-to-dry. The patient has a level 2 surface certainly would benefit from a level 3 if that can be provided. She is relatively immobile cannot turn her self secondary to multiple sclerosis. She tells me she has an aide for most of the day except for 4 hours. She also states she is not up much in a wheelchair. I wonder about her oral intake although she says she eats 3 meals a day and takes Glucerna 10/13; the patient looks somewhat better than 2 weeks ago today. I gave her empiric Augmentin which she took a mobile x-ray was negative for osteomyelitis but they suggested a bone scan. The wound area actually looks somewhat better today 10/27; comes in today with exposed  bone complaints of more pain and odor. She looks as though she is losing weight although she claims to be eating well. She takes Ensure Objective Constitutional Sitting or standing Blood Pressure is within target range for patient.. Pulse regular and within target range for patient.Marland Kitchen Respirations regular, non-labored and within  target range.. Temperature is normal and within the target range for the patient.Marland Kitchen Appears in no distress. Vitals Time Taken: 1:45 PM, Height: 63 in, Weight: 142 lbs, BMI: 25.2, Temperature: 98.2 F, Pulse: 120 bpm, Respiratory Rate: 16 breaths/min, Blood Pressure: 105/72 mmHg. General Notes: Wound exam; open area of exposed bone today. Given her complaints of pain and odor I went ahead and biopsied the bone using rongeurs. Also specimen for culture. Hemostasis with direct pressure and Gelfoam Integumentary (Hair, Skin) Wound #3 status is Open. Original cause of wound was Pressure Injury. The date acquired was: 07/20/2020. The wound has been in treatment 25 weeks. The wound is located on the Sacrum. The wound measures 5.5cm length x 6cm width x 2cm depth; 25.918cm^2 area and 51.836cm^3 volume. There is bone, muscle, and Fat Layer (Subcutaneous Tissue) exposed. There is no tunneling noted, however, there is undermining starting at 11:00 and ending at 2:00 with a maximum distance of 2.7cm. There is a large amount of purulent drainage noted. The wound margin is well defined and not attached to the wound base. There is large (67-100%) red, pink, friable granulation within the wound bed. There is a small (1-33%) amount of necrotic tissue within the wound bed including Adherent Slough. General Notes: Converted #4 to #3 wound. Wound #4 status is Converted. Original cause of wound was Pressure Injury. The date acquired was: 12/30/2020. The wound has been in treatment 15 weeks. The wound is located on the Right Gluteus. The wound measures 5.5cm length x 6cm width x 2cm depth;  25.918cm^2 area and 51.836cm^3 volume. There is a large amount of serosanguineous drainage noted. General Notes: converted #4 to #3 wound. Assessment Active Problems ICD-10 Pressure ulcer of sacral region, stage 4 Multiple sclerosis Pressure ulcer of right buttock, unspecified stage Cellulitis of other sites Procedures Wound #3 Pre-procedure diagnosis of Wound #3 is a Pressure Ulcer located on the Sacrum . There was a Excisional Skin/Subcutaneous Tissue/Muscle/Bone Debridement with a total area of 1 sq cm performed by Ricard Dillon., MD. With the following instrument(s): Rongeur to remove Viable tissue/material. Material removed includes Bone after achieving pain control using Other (Benzocaine 20%). 2 specimens were taken by a Swab and Tissue Culture and sent to the lab per facility protocol. A time out was conducted at 14:15, prior to the start of the procedure. A Moderate amount of bleeding was controlled with Gel Foam. The procedure was tolerated well with a pain level of 0 throughout and a pain level of 0 following the procedure. Post Debridement Measurements: 5.5cm length x 6cm width x 2cm depth; 51.836cm^3 volume. Post debridement Stage noted as Category/Stage IV. Character of Wound/Ulcer Post Debridement is stable. Post procedure Diagnosis Wound #3: Same as Pre-Procedure Plan Follow-up Appointments: Return Appointment in 2 weeks. - Dr. Dellia Nims ***60 minutes extra time hoyer**** Someone will call you with results of the culture and biopsy when the results are in. Bathing/ Shower/ Hygiene: May shower with protection but do not get wound dressing(s) wet. - May shower and use soap and water on days dressings are changed Off-Loading: Air fluidized (Group 3) mattress - arriving 04/20/2021. Turn and reposition every 2 hours Home Health: New wound care orders this week; continue Home Health for wound care. May utilize formulary equivalent dressing for wound treatment orders  unless otherwise specified. - change dressing to silver alginate Dressing changes to be completed by [facility]ulk on Monday / Wednesday / Friday except when patient has scheduled visit at [facility]. Other Home  Health Orders/Instructions: - [facility] Laboratory ordered were: Biopsy specimen culture bone sacral wound, Anerobic culture sacral wound bone culture WOUND #3: - Sacrum Wound Laterality: Cleanser: Soap and Water [facility]) Every Other Day/30 Days Discharge Instructions: May shower and wash wound with dial antibacterial soap and water prior to dressing change. Cleanser: Wound Cleanser [facility]) Every Other Day/30 Days Discharge Instructions: Cleanse the wound with wound cleanser prior to applying a clean dressing using gauze sponges, not tissue or cotton balls. Peri-Wound Care: Skin Prep [facility]) Every Other Day/30 Days Discharge Instructions: Use skin prep as directed Prim Dressing: KerraCel Ag Gelling Fiber Dressing, 4x5 in (silver alginate) (Home Health) Every Other Day/30 Days ary Discharge Instructions: Apply silver alginate to wound bed as instructed Prim Dressing: Pack with saline moistened gauze on top of prisma ([place]) Every Other Day/30 Days ary Secondary Dressing: Woven Gauze Sponge, Non-Sterile 4x4 in ([place]) Every Other Day/30 Days Discharge Instructions: Apply over primary dressing as directed. Secondary Dressing: MPM Excel SAP Bordered Dressing, 7x6.7 (Sacral) (in/in) (Home Health) Every Other Day/30 Days Discharge Instructions: Apply silicone border over primary dressing as directed. #1 still with silver alginate to the wound 2. Specimens for pathology and CandS from a biopsy in the lower sacrum/coccyx 3. No empiric antibiotics Electronic Signature(s) Signed: 04/19/2021 4:29:54 PM By: Linton Ham MD Entered By: Linton Ham on 04/14/2021  15:02:23 -------------------------------------------------------------------------------- SuperBill Details Patient Name: Date of Service: Jocelyn Sanchez [name] 04/14/2021 Medical Record Number: [id number] Patient Account Number: [id number] Date of Birth/Sex: Treating RN: 1956-05-01 (65 y.o. Jocelyn Sanchez, Jocelyn Sanchez Primary Care Provider: [name] Other Clinician: Referring Provider: Treating Provider/Extender: Drucilla Schmidt Weeks in Treatment: 25 Diagnosis Coding ICD-10 Codes Code Description L89.154 Pressure ulcer of sacral region, stage 4 G35 Multiple sclerosis L89.319 Pressure ulcer of right buttock, unspecified stage L03.818 Cellulitis of other sites Facility Procedures CPT4 Code: [id number] Description: 21308 - DEB BONE 20 SQ CM/< ICD-10 Diagnosis Description L89.154 Pressure ulcer of sacral region, stage 4 Modifier: Quantity: 1 Physician Procedures CPT4: Description Modifier Code [id number] Debridement; bone (includes epidermis, dermis, subQ tissue, muscle and/or fascia, if performed) 1st 20 sqcm or less ICD-10 Diagnosis Description L89.154 Pressure ulcer of sacral region, stage 4 Quantity: 1 Electronic Signature(s) Signed: 04/19/2021 4:29:54 PM By: Linton Ham MD Entered By: Linton Ham on 04/14/2021 15:02:33

## 2021-05-04 ENCOUNTER — Encounter (HOSPITAL_BASED_OUTPATIENT_CLINIC_OR_DEPARTMENT_OTHER): Payer: Medicare Other | Attending: Internal Medicine | Admitting: Internal Medicine

## 2021-05-04 ENCOUNTER — Other Ambulatory Visit: Payer: Self-pay

## 2021-05-04 DIAGNOSIS — G35 Multiple sclerosis: Secondary | ICD-10-CM | POA: Diagnosis not present

## 2021-05-04 DIAGNOSIS — I890 Lymphedema, not elsewhere classified: Secondary | ICD-10-CM | POA: Diagnosis not present

## 2021-05-04 DIAGNOSIS — E1151 Type 2 diabetes mellitus with diabetic peripheral angiopathy without gangrene: Secondary | ICD-10-CM | POA: Insufficient documentation

## 2021-05-04 DIAGNOSIS — L89154 Pressure ulcer of sacral region, stage 4: Secondary | ICD-10-CM | POA: Insufficient documentation

## 2021-05-05 NOTE — Progress Notes (Signed)
SANDEEP, DELAGARZA ([id number]) Visit Report for 05/04/2021 Arrival Information Details Patient Name: Date of Service: Reinaldo Meeker [name] 05/04/2021 12:30 PM Medical Record Number: [id number] Patient Account Number: [id number] Date of Birth/Sex: Treating RN: [birth year] (65 y.o. [name] Primary Care Kersti Scavone: [name] Other Clinician: Referring Montrae Braithwaite: Treating Tannisha Kennington/Extender: Drucilla Schmidt Weeks in Treatment: 28 Visit Information History Since Last Visit Added or deleted any medications: No Patient Arrived: Wheel Chair Any new allergies or adverse reactions: No Arrival Time: 12:46 Had a fall or experienced change in No Accompanied By: alone activities of daily living that may affect Transfer Assistance: Harrel Lemon Lift risk of falls: Patient Identification Verified: Yes Signs or symptoms of abuse/neglect since last visito No Secondary Verification Process Completed: Yes Hospitalized since last visit: No Patient Requires Transmission-Based Precautions: No Implantable device outside of the clinic excluding No Patient Has Alerts: Yes cellular tissue based products placed in the center Patient Alerts: Patient on Blood Thinner since last visit: Has Dressing in Place as Prescribed: Yes Pain Present Now: Yes Electronic Signature(s) Signed: 05/05/2021 5:23:58 PM By: Levan Hurst RN, BSN Entered By: Levan Hurst on 05/04/2021 13:08:65 -------------------------------------------------------------------------------- Clinic Level of Care Assessment Details Patient Name: Date of Service: Reinaldo Meeker [name] 05/04/2021 12:30 PM Medical Record Number: [id number] Patient Account Number: [id number] Date of Birth/Sex: Treating RN: November 18, 1955 (65 y.o. [name] Primary Care Gracemarie Skeet: [name] Other Clinician: Referring Tawanda Schall: Treating Dalayla Aldredge/Extender: Drucilla Schmidt Weeks in Treatment: 28 Clinic Level of Care  Assessment Items TOOL 4 Quantity Score X- 1 0 Use when only an EandM is performed on FOLLOW-UP visit ASSESSMENTS - Nursing Assessment / Reassessment X- 1 10 Reassessment of Co-morbidities (includes updates in patient status) X- 1 5 Reassessment of Adherence to Treatment Plan ASSESSMENTS - Wound and Skin A ssessment / Reassessment X - Simple Wound Assessment / Reassessment - one wound 1 5 []  - 0 Complex Wound Assessment / Reassessment - multiple wounds []  - 0 Dermatologic / Skin Assessment (not related to wound area) ASSESSMENTS - Focused Assessment []  - 0 Circumferential Edema Measurements - multi extremities []  - 0 Nutritional Assessment / Counseling / Intervention []  - 0 Lower Extremity Assessment (monofilament, tuning fork, pulses) []  - 0 Peripheral Arterial Disease Assessment (using hand held doppler) ASSESSMENTS - Ostomy and/or Continence Assessment and Care []  - 0 Incontinence Assessment and Management []  - 0 Ostomy Care Assessment and Management (repouching, etc.) PROCESS - Coordination of Care X - Simple Patient / Family Education for ongoing care 1 15 []  - 0 Complex (extensive) Patient / Family Education for ongoing care X- 1 10 Staff obtains Programmer, systems, Records, T Results / Process Orders est X- 1 10 Staff telephones HHA, Nursing Homes / Clarify orders / etc []  - 0 Routine Transfer to another Facility (non-emergent condition) []  - 0 Routine Hospital Admission (non-emergent condition) []  - 0 New Admissions / Biomedical engineer / Ordering NPWT Apligraf, etc. , []  - 0 Emergency Hospital Admission (emergent condition) X- 1 10 Simple Discharge Coordination []  - 0 Complex (extensive) Discharge Coordination PROCESS - Special Needs []  - 0 Pediatric / Minor Patient Management []  - 0 Isolation Patient Management []  - 0 Hearing / Language / Visual special needs []  - 0 Assessment of Community assistance (transportation, D/C planning, etc.) []  -  0 Additional assistance / Altered mentation []  - 0 Support Surface(s) Assessment (bed, cushion, seat, etc.) INTERVENTIONS - Wound Cleansing / Measurement X - Simple  Wound Cleansing - one wound 1 5 []  - 0 Complex Wound Cleansing - multiple wounds X- 1 5 Wound Imaging (photographs - any number of wounds) []  - 0 Wound Tracing (instead of photographs) X- 1 5 Simple Wound Measurement - one wound []  - 0 Complex Wound Measurement - multiple wounds INTERVENTIONS - Wound Dressings X - Small Wound Dressing one or multiple wounds 1 10 []  - 0 Medium Wound Dressing one or multiple wounds []  - 0 Large Wound Dressing one or multiple wounds []  - 0 Application of Medications - topical []  - 0 Application of Medications - injection INTERVENTIONS - Miscellaneous []  - 0 External ear exam []  - 0 Specimen Collection (cultures, biopsies, blood, body fluids, etc.) []  - 0 Specimen(s) / Culture(s) sent or taken to Lab for analysis X- 1 10 Patient Transfer (multiple staff / Civil Service fast streamer / Similar devices) []  - 0 Simple Staple / Suture removal (25 or less) []  - 0 Complex Staple / Suture removal (26 or more) []  - 0 Hypo / Hyperglycemic Management (close monitor of Blood Glucose) []  - 0 Ankle / Brachial Index (ABI) - do not check if billed separately X- 1 5 Vital Signs Has the patient been seen at the hospital within the last three years: Yes Total Score: 105 Level Of Care: New/Established - Level 3 Electronic Signature(s) Signed: 05/05/2021 5:23:58 PM By: Levan Hurst RN, BSN Entered By: Levan Hurst on 05/04/2021 15:31:35 -------------------------------------------------------------------------------- Encounter Discharge Information Details Patient Name: Date of Service: [name] 05/04/2021 12:30 PM Medical Record Number: [id number] Patient Account Number: [id number] Date of Birth/Sex: Treating RN: Feb 07, 1956 (65 y.o. [name] Primary Care Demosthenes Virnig: [name]  Other Clinician: Referring Emira Eubanks: Treating Karalynn Cottone/Extender: Alycia Rossetti in Treatment: 28 Encounter Discharge Information Items Discharge Condition: Stable Ambulatory Status: Wheelchair Discharge Destination: Home Transportation: Private Auto Accompanied By: alone Schedule Follow-up Appointment: Yes Clinical Summary of Care: Patient Declined Electronic Signature(s) Signed: 05/05/2021 5:23:58 PM By: Levan Hurst RN, BSN Entered By: Levan Hurst on 05/04/2021 15:33:07 -------------------------------------------------------------------------------- Multi Wound Chart Details Patient Name: Date of Service: Reinaldo Meeker [name] 05/04/2021 12:30 PM Medical Record Number: [id number] Patient Account Number: [id number] Date of Birth/Sex: Treating RN: Oct 22, 1955 (65 y.o. [name] Primary Care Humza Tallerico: [name] Other Clinician: Referring Tanish Prien: Treating Srah Ake/Extender: Drucilla Schmidt Weeks in Treatment: 28 Vital Signs Height(in): 63 Pulse(bpm): 102 Weight(lbs): 142 Blood Pressure(mmHg): 102/71 Body Mass Index(BMI): 25 Temperature(F): 98.7 Respiratory Rate(breaths/min): 16 Photos: [3:Sacrum] [N/A:N/A N/A] Wound Location: [3:Pressure Injury] [N/A:N/A] Wounding Event: [3:Pressure Ulcer] [N/A:N/A] Primary Etiology: [3:Anemia, Deep Vein Thrombosis,] [N/A:N/A] Comorbid History: [3:Hypertension, Peripheral Venous Disease, Type II Diabetes 07/20/2020] [N/A:N/A] Date Acquired: [3:28] [N/A:N/A] Weeks of Treatment: [3:Open] [N/A:N/A] Wound Status: [3:5.5x5.2x1.5] [N/A:N/A] Measurements L x W x D (cm) [3:22.462] [N/A:N/A] A (cm) : rea [3:33.694] [N/A:N/A] Volume (cm) : [3:-281.40%] [N/A:N/A] % Reduction in A rea: [3:-281.30%] [N/A:N/A] % Reduction in Volume: [3:12] Starting Position 1 (o'clock): [3:2] Ending Position 1 (o'clock): [3:2.5] Maximum Distance 1 (cm): [3:Yes] [N/A:N/A] Undermining: [3:Category/Stage IV]  [N/A:N/A] Classification: [3:Medium] [N/A:N/A] Exudate A mount: [3:Serosanguineous] [N/A:N/A] Exudate Type: [3:red, brown] [N/A:N/A] Exudate Color: [3:Well defined, not attached] [N/A:N/A] Wound Margin: [3:Large (67-100%)] [N/A:N/A] Granulation A mount: [3:Red, Pink, Friable] [N/A:N/A] Granulation Quality: [3:Small (1-33%)] [N/A:N/A] Necrotic A mount: [3:Fat Layer (Subcutaneous Tissue): Yes N/A] Exposed Structures: [3:Muscle: Yes Fascia: No Tendon: No Joint: No Bone: No None] [N/A:N/A] Treatment Notes Electronic Signature(s) Signed: 05/05/2021 1:42:03 PM By: Dellia Nims,  Legrand Como MD Signed: 05/05/2021 5:23:58 PM By: Levan Hurst RN, BSN Entered By: Linton Ham on 05/04/2021 13:18:30 -------------------------------------------------------------------------------- Multi-Disciplinary Care Plan Details Patient Name: Date of Service: Ledora Bottcher, Lindie Spruce [name] 05/04/2021 12:30 PM Medical Record Number: [id number] Patient Account Number: [id number] Date of Birth/Sex: Treating RN: 1956-01-18 (65 y.o. [name] Primary Care Melany Wiesman: [name] Other Clinician: Referring Tyrus Wilms: Treating Jadasia Haws/Extender: Alycia Rossetti in Treatment: 28 Multidisciplinary Care Plan reviewed with physician Active Inactive Pressure Nursing Diagnoses: Knowledge deficit related to causes and risk factors for pressure ulcer development Knowledge deficit related to management of pressures ulcers Potential for impaired tissue integrity related to pressure, friction, moisture, and shear Goals: Patient/caregiver will verbalize understanding of pressure ulcer management Date Initiated: 03/17/2021 Target Resolution Date: 06/03/2021 Goal Status: Active Interventions: Assess: immobility, friction, shearing, incontinence upon admission and as needed Assess offloading mechanisms upon admission and as needed Assess potential for pressure ulcer upon admission and as needed Provide  education on pressure ulcers Treatment Activities: Pressure reduction/relief device ordered : 03/17/2021 Notes: Wound/Skin Impairment Nursing Diagnoses: Impaired tissue integrity Knowledge deficit related to ulceration/compromised skin integrity Goals: Patient will have a decrease in wound volume by X% from date: (specify in notes) Date Initiated: 10/19/2020 Date Inactivated: 03/17/2021 Target Resolution Date: 02/18/2021 Goal Status: Unmet Unmet Reason: glut wound deteriorating Patient/caregiver will verbalize understanding of skin care regimen Date Initiated: 10/19/2020 Target Resolution Date: 06/03/2021 Goal Status: Active Ulcer/skin breakdown will have a volume reduction of 30% by week 4 Date Initiated: 10/19/2020 Date Inactivated: 12/09/2020 Target Resolution Date: 11/19/2020 Unmet Reason: see wound Goal Status: Unmet measurements. Ulcer/skin breakdown will have a volume reduction of 50% by week 8 Date Initiated: 10/19/2020 Date Inactivated: 12/30/2020 Target Resolution Date: 12/16/2020 Unmet Reason: see wound Goal Status: Unmet measurements. Interventions: Assess patient/caregiver ability to obtain necessary supplies Assess patient/caregiver ability to perform ulcer/skin care regimen upon admission and as needed Assess ulceration(s) every visit Provide education on ulcer and skin care Notes: Electronic Signature(s) Signed: 05/05/2021 5:23:58 PM By: Levan Hurst RN, BSN Entered By: Levan Hurst on 05/04/2021 13:05:15 -------------------------------------------------------------------------------- Pain Assessment Details Patient Name: Date of Service: Reinaldo Meeker [name] 05/04/2021 12:30 PM Medical Record Number: [id number] Patient Account Number: [id number] Date of Birth/Sex: Treating RN: 12-03-1955 (65 y.o. [name] Primary Care Sharyah Bostwick: [name] Other Clinician: Referring Lashena Signer: Treating Keileigh Vahey/Extender: Drucilla Schmidt Weeks in  Treatment: 28 Active Problems Location of Pain Severity and Description of Pain Patient Has Paino Yes Site Locations Pain Location: Pain Location: Pain in Ulcers With Dressing Change: Yes Duration of the Pain. Constant / Intermittento Intermittent Rate the pain. Current Pain Level: 8 Character of Pain Describe the Pain: Throbbing Pain Management and Medication Current Pain Management: Medication: Yes Cold Application: No Rest: No Massage: No Activity: No T.E.N.S.: No Heat Application: No Leg drop or elevation: No Is the Current Pain Management Adequate: Adequate How does your wound impact your activities of daily livingo Sleep: No Bathing: No Appetite: No Relationship With Others: No Bladder Continence: No Emotions: No Bowel Continence: No Work: No Toileting: No Drive: No Dressing: No Hobbies: No Electronic Signature(s) Signed: 05/05/2021 5:23:58 PM By: Levan Hurst RN, BSN Entered By: Levan Hurst on 05/04/2021 13:03:04 -------------------------------------------------------------------------------- Patient/Caregiver Education Details Patient Name: Date of Service: Reinaldo Meeker [name] 11/16/2022andnbsp12:30 PM Medical Record Number: [id number] Patient Account Number: [id number] Date of Birth/Gender: Treating RN: [birth year] (65 y.o. [name] Primary Care Physician: Dixie Dials  S Other Clinician: Referring Physician: Treating Physician/Extender: Alycia Rossetti in Treatment: 67 Education Assessment Education Provided To: Patient Education Topics Provided Wound/Skin Impairment: Methods: Explain/Verbal Responses: State content correctly Electronic Signature(s) Signed: 05/05/2021 5:23:58 PM By: Levan Hurst RN, BSN Entered By: Levan Hurst on 05/04/2021 13:05:30 -------------------------------------------------------------------------------- Wound Assessment Details Patient Name: Date of Service: Reinaldo Meeker  [name] 05/04/2021 12:30 PM Medical Record Number: [id number] Patient Account Number: [id number] Date of Birth/Sex: Treating RN: 1956/02/12 (65 y.o. [name] Primary Care Murat Rideout: [name] Other Clinician: Referring Shailene Demonbreun: Treating Ger Nicks/Extender: Drucilla Schmidt Weeks in Treatment: 28 Wound Status Wound Number: 3 Primary Pressure Ulcer Etiology: Wound Location: Sacrum Wound Open Wounding Event: Pressure Injury Status: Date Acquired: 07/20/2020 Comorbid Anemia, Deep Vein Thrombosis, Hypertension, Peripheral Weeks Of Treatment: 28 History: Venous Disease, Type II Diabetes Clustered Wound: No Photos Wound Measurements Length: (cm) 5.5 Width: (cm) 5.2 Depth: (cm) 1.5 Area: (cm) 22.462 Volume: (cm) 33.694 % Reduction in Area: -281.4% % Reduction in Volume: -281.3% Epithelialization: None Tunneling: No Undermining: Yes Starting Position (o'clock): 12 Ending Position (o'clock): 2 Maximum Distance: (cm) 2.5 Wound Description Classification: Category/Stage IV Wound Margin: Well defined, not attached Exudate Amount: Medium Exudate Type: Serosanguineous Exudate Color: red, brown Foul Odor After Cleansing: No Slough/Fibrino Yes Wound Bed Granulation Amount: Large (67-100%) Exposed Structure Granulation Quality: Red, Pink, Friable Fascia Exposed: No Necrotic Amount: Small (1-33%) Fat Layer (Subcutaneous Tissue) Exposed: Yes Necrotic Quality: Adherent Slough Tendon Exposed: No Muscle Exposed: Yes Necrosis of Muscle: No Joint Exposed: No Bone Exposed: No Treatment Notes Wound #3 (Sacrum) Cleanser Soap and Water Discharge Instruction: May shower and wash wound with dial antibacterial soap and water prior to dressing change. Wound Cleanser Discharge Instruction: Cleanse the wound with wound cleanser prior to applying a clean dressing using gauze sponges, not tissue or cotton balls. Peri-Wound Care Skin Prep Discharge Instruction:  Use skin prep as directed Topical Primary Dressing KerraCel Ag Gelling Fiber Dressing, 4x5 in (silver alginate) Discharge Instruction: Apply silver alginate to wound bed as instructed Secondary Dressing Woven Gauze Sponge, Non-Sterile 4x4 in Discharge Instruction: Apply over primary dressing as directed. MPM Excel SAP Bordered Dressing, 7x6.7 (Sacral) (in/in) Discharge Instruction: Apply silicone border over primary dressing as directed. Secured With Compression Wrap Compression Stockings Environmental education officer) Signed: 05/05/2021 5:23:58 PM By: Levan Hurst RN, BSN Entered By: Levan Hurst on 05/04/2021 13:06:13 -------------------------------------------------------------------------------- Vitals Details Patient Name: Date of Service: Ledora Bottcher, Lindie Spruce [name] 05/04/2021 12:30 PM Medical Record Number: [id number] Patient Account Number: [id number] Date of Birth/Sex: Treating RN: [birth year] (65 y.o. [name] Primary Care Shatiqua Heroux: [name] Other Clinician: Referring Eliyahu Bille: Treating Masen Salvas/Extender: Drucilla Schmidt Weeks in Treatment: 28 Vital Signs Time Taken: 12:46 Temperature (F): 98.7 Height (in): 63 Pulse (bpm): 102 Weight (lbs): 142 Respiratory Rate (breaths/min): 16 Body Mass Index (BMI): 25.2 Blood Pressure (mmHg): 102/71 Reference Range: 80 - 120 mg / dl Electronic Signature(s) Signed: 05/05/2021 5:23:58 PM By: Levan Hurst RN, BSN Entered By: Levan Hurst on 05/04/2021 13:02:48

## 2021-05-05 NOTE — Progress Notes (Signed)
SHEREA, LIPTAK ([id number]) Visit Report for 05/04/2021 HPI Details Patient Name: Date of Service: Jocelyn Sanchez [name] 05/04/2021 12:30 PM Medical Record Number: [id number] Patient Account Number: [id number] Date of Birth/Sex: Treating RN: 1955-10-19 (65 y.o. [name] Primary Care Provider: [name] Other Clinician: Referring Provider: Treating Provider/Extender: Drucilla Schmidt Weeks in Treatment: 28 History of Present Illness Location: Midline sacral region Quality: Patient describes this as a soreness Severity: 2 out of 10 Duration: Greater than one year prior to presentation to the wound center Timing: Pain occurs most often with dressing changes Context: Developmental result of pressure over time to the sacral region Modifying Factors: Patient has previously had this wound packed with gauze, she has had a wound VAC, and she tells me other various wound care treatments over the past year. ssociated Signs and Symptoms: Multiple sclerosis, hypertension, diabetes mellitus type 2, lymphedema A HPI Description: 03/27/16 patient presents today on initial evaluation concerning her sacral pressure injury which has been present for roughly one year. She tells me that she does not know of any x-rays or other advanced imaging has been performed in regard to the sacral region. She has been provided with wound care at the [facility] facility at this point in time. She has MS and is not able to move she tells me "at all". She does tell me however that the facility is very good about re-positioning her at least every 2 hours. With that being said she did fell the wound VAC apparently. She had this twice the first time she tells me that it did not seem to be working very well and the second time it actually made things worse this was just 2 months ago. Therefore this was discontinued. at this point in time today she is not having any signs or symptoms of  systemic infection she is on a blood thinner which is Eliquis at this point in time. 03/24/16; patient admitted to our clinic by Jeri Cos last week. She is a lady with advanced MS who is had a wound on her lower sacral area for most of the last year. She reiterates to me that wound vacs have not worked for her in fact she states they've actually made her wound worse on the last occasion. An x- ray was apparently done in the facility although I don't have these results. 03/31/16; this patient had an x-ray done at the facility that did not show evidence of osteomyelitis. Her bone biopsy did not show osteomyelitis. 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 She had been suggested to have collagen on the base of the wound covered with normal saline wet to dry although her intake nurse reports there was no collagen on the wound surface. 05/15/16 wound is not changed that much. This is a substantial stage IV wound with considerable undermining from 7 to 3:00. I have spoken to the wound care nurse at the [facility] facility, there was no records on what exactly caused the wound VAC to fail or records of the same. 06/05/16; I received communication from [facility] facility reporting that the patient refuses the wound back more often that it's on. When I confronted her with this today in the room she denied that she was responsible stating for the most part the  problem is because she has trouble getting them to deal with her Foley catheter properly. I don't exactly follow her line of reasoning here however apparently they've been putting hydrogel wet to dry and in the facility 07/03/16; I have not heard anything from [place]. The patient states she has not had a wound VAC the last 2-3 days due to "battery  problems". She arrives today with wet to dry dressing. She has advanced MS and is a chronic care resident at the skilled facility 07/31/16; patient is a resident of [facility] skilled facility. She has had the VAC applied but did not arrive with the [facility] in place. Her wound generally looks better perhaps somewhat less tunneling superiorly. There is still some exposed bone however. 09/11/16; I follow this patient with advanced MS who is a resident of [facility] skilled facility on a monthly basis. She arrives 6 weeks after her last visit. The wound VAC apparently is no longer being used according to the patient because the nurses in the facility could not maintain a seal in this area. Via the patient it seems that they're using some form of wet to dry dressing although he received no documentation. The patient states she is eating well she is being turned in the facility. 10/09/16; I follow this patient with advanced MS who is a resident of [facility]. She apparently could not have a wound VAC is the facility could not maintain a seal therefore they're using wet-to-dry dressings however the patient arrives today with better measurements and a nicely granulated wound bed. 11/05/16; patient with advanced MS was a pressure area on the lower sacrum/coccyx. She could not have a wound VAC because the facility could n/ot maintain a seal. I ordered Prisma last time. Wound dimensions are about the same per intake 12/04/16; pressure area on the lower sacrum. using collagen. Has a new concern on the right great toe 01/01/17; patient has now only a small open area on the lower sacrumcoccyx she is been doing well and this wound is gradually progressing towards closure. The area on the right great toe that looked to me like a paronychia last time is also quite a bit better although the nail here looks as though it's loosening READMISSION 10/19/2020 This is a patient with advanced multiple sclerosis. We had her in clinic  for a prolonged period in late 2017 to the summer 2018 with a stage IV wound on her lower sacrum/coccyx. I do not think we healed her out although her wound was a lot better. She was a resident at [facility] skilled facility at the time The patient states the wound reopened about 3 or 4 months ago. I am not completely certain from talking to the patient if this is in the same position as last time or even indeed if it ever closed. 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 She has a home health aide who comes twice a day. She had an air mattress but apparently it does not work As far as I can tell there is not been any major change in her medical problem list except she lives at home now. She is a diabetic on metformin 6/23; patient has not been here in about 6-7 weeks. I am not sure what the issue is and the patient does not seem to really know. She takes scat for transportation although that has not  been a problem before. We changed her dressing last time she was here to silver collagen moistened with the backing wet- to-dry and a foam border. Her wound is quite a bit better looking. 7/14; 3-week follow-up. The patient's wound is not as good as last time. It is wider I think more undermining superiorly. Also there appears to be superficial skin breakdown superiorly and the skin around the wound and also from about 2-4 o'clock. Not a particularly viable surface. We have been using silver collagen and backing wet-to-dry. The patient lives in her own home. She apparently has care attendance for almost all of the day. She has a surface on her bed but this is clearly not a level 3. She also has home health. She claims to be eating well 8/4; patient presents for 3-week follow-up. She has no issues or complaints today. She reports using wet-to-dry dressings for her sacral wound and collagen to the  buttocks wound. She states that her new mattress is arriving today 9/29; its been almost 2 months since the patient was in the clinic. She has a sacral wound which is worse I think with exposed bone to the right under the undermining area. Also concerning is a marked deterioration of her left buttock. With a superficial necrotic area and several small superficial areas. She has been using silver collagen wet-to-dry. The patient has a level 2 surface certainly would benefit from a level 3 if that can be provided. She is relatively immobile cannot turn her self secondary to multiple sclerosis. She tells me she has an aide for most of the day except for 4 hours. She also states she is not up much in a wheelchair. I wonder about her oral intake although she says she eats 3 meals a day and takes Glucerna 10/13; the patient looks somewhat better than 2 weeks ago today. I gave her empiric Augmentin which she took a mobile x-ray was negative for osteomyelitis but they suggested a bone scan. The wound area actually looks somewhat better today 10/27; comes in today with exposed bone complaints of more pain and odor. She looks as though she is losing weight although she claims to be eating well. She takes Ensure 11/16; patient came in 2 weeks ago with exposed bone. I did a biopsy this was negative for osteomyelitis. Bone culture was also negative showing multiple organisms. I believe I did give her Augmentin which gave her some diarrhea but she does not need further antibiotic. We are using silver alginate to this wound and also the closely position superficial area on the skin just above the wound. She managed to get a level 3 surface that we had ordered she says she does not like it because it is hot and noisy. But nevertheless she is using a Engineer, maintenance) Signed: 05/05/2021 1:42:03 PM By: Linton Ham MD Entered By: Linton Ham on 05/04/2021  13:20:15 -------------------------------------------------------------------------------- Physical Exam Details Patient Name: Date of Service: Jocelyn Sanchez [name] 05/04/2021 12:30 PM Medical Record Number: [id number] Patient Account Number: [id number] Date of Birth/Sex: Treating RN: Oct 16, 1955 (65 y.o. [name] Primary Care Provider: [name] Other Clinician: Referring Provider: Treating Provider/Extender: Drucilla Schmidt Weeks in Treatment: 28 Constitutional Sitting or standing Blood Pressure is within target range for patient.. Pulse regular and within target range for patient.Marland Kitchen Respirations regular, non-labored and within target range.. Temperature is normal and within the target range for the patient.Marland Kitchen Appears in no distress. Gastrointestinal (GI) Abdomen is  soft and non-distended without masses or tenderness.. No liver or spleen enlargement. Notes Wound exam; the wound is 100% covered in granulation although not a lot over the bone. The area superiorly just above the wound is more superficial but perhaps a bit larger. The patient looks very thin and frail although she claims to be eating well. Electronic Signature(s) Signed: 05/05/2021 1:42:03 PM By: Linton Ham MD Entered By: Linton Ham on 05/04/2021 13:21:18 -------------------------------------------------------------------------------- Physician Orders Details Patient Name: Date of Service: Jocelyn Sanchez [name] 05/04/2021 12:30 PM Medical Record Number: [id number] Patient Account Number: [id number] Date of Birth/Sex: Treating RN: [birth year] (65 y.o. [name] Primary Care Provider: [name] Other Clinician: Referring Provider: Treating Provider/Extender: Drucilla Schmidt Weeks in Treatment: 40 Verbal / Phone Orders: No Diagnosis Coding ICD-10 Coding Code Description L89.154 Pressure ulcer of sacral region, stage 4 G35 Multiple sclerosis L89.319  Pressure ulcer of right buttock, unspecified stage L03.818 Cellulitis of other sites Follow-up Appointments Return appointment in 1 month. - Dr. Dellia Nims ***[facility]*** Bathing/ Shower/ Hygiene May shower with protection but do not get wound dressing(s) wet. - May shower and use soap and water on days dressings are changed Off-Loading A fluidized (Group 3) mattress ir Turn and reposition every 2 hours Home Health No change in wound care orders this week; continue Home Health for wound care. May utilize formulary equivalent dressing for wound treatment orders unless otherwise specified. Dressing changes to be completed by [facility] [place] on Monday / Wednesday / Friday except when patient has scheduled visit at [facility]. Other Home Health Orders/Instructions: - [facility] Wound Treatment Wound #3 - Sacrum Cleanser: Soap and Water [facility]) Every Other Day/30 Days Discharge Instructions: May shower and wash wound with dial antibacterial soap and water prior to dressing change. Cleanser: Wound Cleanser [facility]) Every Other Day/30 Days Discharge Instructions: Cleanse the wound with wound cleanser prior to applying a clean dressing using gauze sponges, not tissue or cotton balls. Peri-Wound Care: Skin Prep [facility]) Every Other Day/30 Days Discharge Instructions: Use skin prep as directed Prim Dressing: KerraCel Ag Gelling Fiber Dressing, 4x5 in (silver alginate) (Home Health) Every Other Day/30 Days ary Discharge Instructions: Apply silver alginate to wound bed as instructed Secondary Dressing: Woven Gauze Sponge, Non-Sterile 4x4 in [facility]) Every Other Day/30 Days Discharge Instructions: Apply over primary dressing as directed. Secondary Dressing: MPM Excel SAP Bordered Dressing, 7x6.7 (Sacral) (in/in) (Home Health) Every Other Day/30 Days Discharge Instructions: Apply silicone border over primary dressing as directed. Electronic Signature(s) Signed: 05/05/2021 1:42:03 PM  By: Linton Ham MD Signed: 05/05/2021 5:23:58 PM By: Levan Hurst RN, BSN Entered By: Levan Hurst on 05/04/2021 13:08:54 -------------------------------------------------------------------------------- Problem List Details Patient Name: Date of Service: Jocelyn Sanchez [name] 05/04/2021 12:30 PM Medical Record Number: [id number] Patient Account Number: [id number] Date of Birth/Sex: Treating RN: 1956-06-18 (64 y.o. [name] Primary Care Provider: [name] Other Clinician: Referring Provider: Treating Provider/Extender: Drucilla Schmidt Weeks in Treatment: 28 Active Problems ICD-10 Encounter Code Description Active Date MDM Diagnosis L89.154 Pressure ulcer of sacral region, stage 4 10/19/2020 No Yes G35 Multiple sclerosis 10/19/2020 No Yes L89.319 Pressure ulcer of right buttock, unspecified stage 03/17/2021 No Yes L03.818 Cellulitis of other sites 03/17/2021 No Yes Inactive Problems Resolved Problems Electronic Signature(s) Signed: 05/05/2021 1:42:03 PM By: Linton Ham MD Entered By: Linton Ham on 05/04/2021 13:18:23 -------------------------------------------------------------------------------- Progress Note Details Patient Name: Date of Service: Jocelyn Sanchez  [name] 05/04/2021 12:30 PM Medical Record Number: [id number] Patient Account Number: [id number] Date of Birth/Sex: Treating RN: 09/09/1955 (65 y.o. [name] Primary Care Provider: [name] Other Clinician: Referring Provider: Treating Provider/Extender: Drucilla Schmidt Weeks in Treatment: 28 Subjective History of Present Illness (HPI) The following HPI elements were documented for the patient's wound: Location: Midline sacral region Quality: Patient describes this as a soreness Severity: 2 out of 10 Duration: Greater than one year prior to presentation to the wound center Timing: Pain occurs most often with dressing changes Context:  Developmental result of pressure over time to the sacral region Modifying Factors: Patient has previously had this wound packed with gauze, she has had a wound VAC, and she tells me other various wound care treatments over the past year. Associated Signs and Symptoms: Multiple sclerosis, hypertension, diabetes mellitus type 2, lymphedema 03/27/16 patient presents today on initial evaluation concerning her sacral pressure injury which has been present for roughly one year. She tells me that she does not know of any x-rays or other advanced imaging has been performed in regard to the sacral region. She has been provided with wound care at the [facility] facility at this point in time. She has MS and is not able to move she tells me "at all". She does tell me however that the facility is very good about re-positioning her at least every 2 hours. With that being said she did fell the wound VAC apparently. She had this twice the first time she tells me that it did not seem to be working very well and the second time it actually made things worse this was just 2 months ago. Therefore this was discontinued. at this point in time today she is not having any signs or symptoms of systemic infection she is on a blood thinner which is Eliquis at this point in time. 03/24/16; patient admitted to our clinic by Jeri Cos last week. She is a lady with advanced MS who is had a wound on her lower sacral area for most of the last year. She reiterates to me that wound vacs have not worked for her in fact she states they've actually made her wound worse on the last occasion. An x- ray was apparently done in the facility although I don't have these results. 03/31/16; this patient had an x-ray done at the facility that did not show evidence of osteomyelitis. Her bone biopsy did not show osteomyelitis. 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 She had been suggested to have collagen on the base of the wound covered with normal saline wet to dry although her intake nurse reports there was no collagen on the wound surface. 05/15/16 wound is not changed that much. This is a substantial stage IV wound with considerable undermining from 7 to 3:00. I have spoken to the wound care nurse at the [facility] facility, there was no records on what exactly caused the wound VAC to fail or records of the same. 06/05/16; I received communication from [facility] skilled facility reporting that the patient refuses the wound back more often that it's on. When I confronted her with this today in the room she denied that she was responsible stating for the most part the problem is because she has trouble getting them to  deal with her Foley catheter properly. I don't exactly follow her line of reasoning here however apparently they've been putting hydrogel wet to dry and in the facility 07/03/16; I have not heard anything from [place]. The patient states she has not had a wound VAC the last 2-3 days due to "battery problems". She arrives today with wet to dry dressing. She has advanced MS and is a chronic care resident at the skilled facility 07/31/16; patient is a resident of [facility] skilled facility. She has had the VAC applied but did not arrive with the [facility] in place. Her wound generally looks better perhaps somewhat less tunneling superiorly. There is still some exposed bone however. 09/11/16; I follow this patient with advanced MS who is a resident of [facility] skilled facility on a monthly basis. She arrives 6 weeks after her last visit. The wound VAC apparently is no longer being used according to the patient because the nurses in the facility could not maintain a seal in this area. Via the patient it seems that they're using  some form of wet to dry dressing although he received no documentation. The patient states she is eating well she is being turned in the facility. 10/09/16; I follow this patient with advanced MS who is a resident of [facility]. She apparently could not have a wound VAC is the facility could not maintain a seal therefore they're using wet-to-dry dressings however the patient arrives today with better measurements and a nicely granulated wound bed. 11/05/16; patient with advanced MS was a pressure area on the lower sacrum/coccyx. She could not have a wound VAC because the facility could n/ot maintain a seal. I ordered Prisma last time. Wound dimensions are about the same per intake 12/04/16; pressure area on the lower sacrum. using collagen. Has a new concern on the right great toe 01/01/17; patient has now only a small open area on the lower sacrumoococcyx she is been doing well and this wound is gradually progressing towards closure. The area on the right great toe that looked to me like a paronychia last time is also quite a bit better although the nail here looks as though it's loosening READMISSION 10/19/2020 This is a patient with advanced multiple sclerosis. We had her in clinic for a prolonged period in late 2017 to the summer 2018 with a stage IV wound on her lower sacrum/coccyx. I do not think we healed her out although her wound was a lot better. She was a resident at [facility] skilled facility at the time The patient states the wound reopened about 3 or 4 months ago. I am not completely certain from talking to the patient if this is in the same position as last time or even indeed if it ever closed. 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 She has a home health aide who comes twice a day. She had an air mattress but apparently it does not work As far as I can tell there is not been  any major change in her medical problem list except she lives at home now. She is a diabetic on metformin 6/23; patient has not been here in about 6-7 weeks. I am not sure what the issue is and the patient does not seem to really know. She takes scat for transportation although that has not been a problem before. We changed her dressing last  time she was here to silver collagen moistened with the backing wet- to-dry and a foam border. Her wound is quite a bit better looking. 7/14; 3-week follow-up. The patient's wound is not as good as last time. It is wider I think more undermining superiorly. Also there appears to be superficial skin breakdown superiorly and the skin around the wound and also from about 2-4 o'clock. Not a particularly viable surface. We have been using silver collagen and backing wet-to-dry. The patient lives in her own home. She apparently has care attendance for almost all of the day. She has a surface on her bed but this is clearly not a level 3. She also has home health. She claims to be eating well 8/4; patient presents for 3-week follow-up. She has no issues or complaints today. She reports using wet-to-dry dressings for her sacral wound and collagen to the buttocks wound. She states that her new mattress is arriving today 9/29; its been almost 2 months since the patient was in the clinic. She has a sacral wound which is worse I think with exposed bone to the right under the undermining area. Also concerning is a marked deterioration of her left buttock. With a superficial necrotic area and several small superficial areas. She has been using silver collagen wet-to-dry. The patient has a level 2 surface certainly would benefit from a level 3 if that can be provided. She is relatively immobile cannot turn her self secondary to multiple sclerosis. She tells me she has an aide for most of the day except for 4 hours. She also states she is not up much in a wheelchair. I wonder  about her oral intake although she says she eats 3 meals a day and takes Glucerna 10/13; the patient looks somewhat better than 2 weeks ago today. I gave her empiric Augmentin which she took a mobile x-ray was negative for osteomyelitis but they suggested a bone scan. The wound area actually looks somewhat better today 10/27; comes in today with exposed bone complaints of more pain and odor. She looks as though she is losing weight although she claims to be eating well. She takes Ensure 11/16; patient came in 2 weeks ago with exposed bone. I did a biopsy this was negative for osteomyelitis. Bone culture was also negative showing multiple organisms. I believe I did give her Augmentin which gave her some diarrhea but she does not need further antibiotic. We are using silver alginate to this wound and also the closely position superficial area on the skin just above the wound. She managed to get a level 3 surface that we had ordered she says she does not like it because it is hot and noisy. But nevertheless she is using a Objective Constitutional Sitting or standing Blood Pressure is within target range for patient.. Pulse regular and within target range for patient.Marland Kitchen Respirations regular, non-labored and within target range.. Temperature is normal and within the target range for the patient.Marland Kitchen Appears in no distress. Vitals Time Taken: 12:46 PM, Height: 63 in, Weight: 142 lbs, BMI: 25.2, Temperature: 98.7 F, Pulse: 102 bpm, Respiratory Rate: 16 breaths/min, Blood Pressure: 102/71 mmHg. Gastrointestinal (GI) Abdomen is soft and non-distended without masses or tenderness.. No liver or spleen enlargement. General Notes: Wound exam; the wound is 100% covered in granulation although not a lot over the bone. The area superiorly just above the wound is more superficial but perhaps a bit larger. The patient looks very thin and frail although she claims to be  eating well. Integumentary (Hair, Skin) Wound  #3 status is Open. Original cause of wound was Pressure Injury. The date acquired was: 07/20/2020. The wound has been in treatment 28 weeks. The wound is located on the Sacrum. The wound measures 5.5cm length x 5.2cm width x 1.5cm depth; 22.462cm^2 area and 33.694cm^3 volume. There is muscle and Fat Layer (Subcutaneous Tissue) exposed. There is no tunneling noted, however, there is undermining starting at 12:00 and ending at 2:00 with a maximum distance of 2.5cm. There is a medium amount of serosanguineous drainage noted. The wound margin is well defined and not attached to the wound base. There is large (67-100%) red, pink, friable granulation within the wound bed. There is a small (1-33%) amount of necrotic tissue within the wound bed including Adherent Slough. Assessment Active Problems ICD-10 Pressure ulcer of sacral region, stage 4 Multiple sclerosis Pressure ulcer of right buttock, unspecified stage Cellulitis of other sites Plan Follow-up Appointments: Return appointment in 1 month. - Dr. Dellia Nims ***[facility]*** Bathing/ Shower/ Hygiene: May shower with protection but do not get wound dressing(s) wet. - May shower and use soap and water on days dressings are changed Off-Loading: Air fluidized (Group 3) mattress Turn and reposition every 2 hours Home Health: No change in wound care orders this week; continue Home Health for wound care. May utilize formulary equivalent dressing for wound treatment orders unless otherwise specified. Dressing changes to be completed by [facility] on Monday / Wednesday / Friday except when patient has scheduled visit at [facility]. Other Home Health Orders/Instructions: - [facility] WOUND #3: - Sacrum Wound Laterality: Cleanser: Soap and Water [facility]) Every Other Day/30 Days Discharge Instructions: May shower and wash wound with dial antibacterial soap and water prior to dressing change. Cleanser: Wound Cleanser [facility]) Every Other Day/30  Days Discharge Instructions: Cleanse the wound with wound cleanser prior to applying a clean dressing using gauze sponges, not tissue or cotton balls. Peri-Wound Care: Skin Prep [facility]) Every Other Day/30 Days Discharge Instructions: Use skin prep as directed Prim Dressing: KerraCel Ag Gelling Fiber Dressing, 4x5 in (silver alginate) (Home Health) Every Other Day/30 Days ary Discharge Instructions: Apply silver alginate to wound bed as instructed Secondary Dressing: Woven Gauze Sponge, Non-Sterile 4x4 in [facility]) Every Other Day/30 Days Discharge Instructions: Apply over primary dressing as directed. Secondary Dressing: MPM Excel SAP Bordered Dressing, 7x6.7 (Sacral) (in/in) (Home Health) Every Other Day/30 Days Discharge Instructions: Apply silicone border over primary dressing as directed. #1 I continue with the silver alginate until the next time I see her which time a silver collagen based dressing. 2. She looks increasingly frail with claims to be eating well. 3. She has a group 3 surface and claims to be positioning herself in the bed in addition. 4. She is cared for at night but her sister and then during the day by a home health aide Electronic Signature(s) Signed: 05/05/2021 1:42:03 PM By: Linton Ham MD Entered By: Linton Ham on 05/04/2021 13:22:39 -------------------------------------------------------------------------------- SuperBill Details Patient Name: Date of Service: Jocelyn Sanchez [name] 05/04/2021 Medical Record Number: [id number] Patient Account Number: [id number] Date of Birth/Sex: Treating RN: 1956-01-02 (65 y.o. [name] Primary Care Provider: [name] Other Clinician: Referring Provider: Treating Provider/Extender: Drucilla Schmidt Weeks in Treatment: 28 Diagnosis Coding ICD-10 Codes Code Description L89.154 Pressure ulcer of sacral region, stage 4 G35 Multiple sclerosis L89.319 Pressure ulcer of right  buttock, unspecified stage L03.818 Cellulitis of other sites Facility  Procedures CPT4 Code: [id number] Description: 55015 - WOUND CARE VISIT-LEV 3 EST PT Modifier: Quantity: 1 Physician Procedures : CPT4 Code Description Modifier [id number] 93552 - WC PHYS LEVEL 3 - EST PT ICD-10 Diagnosis Description L89.154 Pressure ulcer of sacral region, stage 4 L89.319 Pressure ulcer of right buttock, unspecified stage Quantity: 1 Electronic Signature(s) Signed: 05/05/2021 1:42:03 PM By: Linton Ham MD Signed: 05/05/2021 5:23:58 PM By: Levan Hurst RN, BSN Entered By: Levan Hurst on 05/04/2021 15:31:42

## 2021-06-01 ENCOUNTER — Encounter (HOSPITAL_BASED_OUTPATIENT_CLINIC_OR_DEPARTMENT_OTHER): Payer: Medicare Other | Attending: Internal Medicine | Admitting: Internal Medicine

## 2021-06-01 ENCOUNTER — Other Ambulatory Visit: Payer: Self-pay

## 2021-06-01 DIAGNOSIS — L89154 Pressure ulcer of sacral region, stage 4: Secondary | ICD-10-CM | POA: Insufficient documentation

## 2021-06-01 DIAGNOSIS — L03818 Cellulitis of other sites: Secondary | ICD-10-CM | POA: Diagnosis not present

## 2021-06-01 DIAGNOSIS — L89319 Pressure ulcer of right buttock, unspecified stage: Secondary | ICD-10-CM | POA: Diagnosis not present

## 2021-06-01 DIAGNOSIS — Z7984 Long term (current) use of oral hypoglycemic drugs: Secondary | ICD-10-CM | POA: Diagnosis not present

## 2021-06-01 DIAGNOSIS — I890 Lymphedema, not elsewhere classified: Secondary | ICD-10-CM | POA: Insufficient documentation

## 2021-06-01 DIAGNOSIS — G35 Multiple sclerosis: Secondary | ICD-10-CM | POA: Diagnosis not present

## 2021-06-01 DIAGNOSIS — I10 Essential (primary) hypertension: Secondary | ICD-10-CM | POA: Insufficient documentation

## 2021-06-01 NOTE — Progress Notes (Signed)
SHAMIRACLE, GORDEN ([id number]) Visit Report for 06/01/2021 Arrival Information Details Patient Name: Date of Service: Reinaldo Meeker [name] 06/01/2021 12:30 PM Medical Record Number: [id number] Patient Account Number: [id number] Date of Birth/Sex: Treating RN: 25-Jul-1955 (65 y.o. [name] Primary Care Danniela Mcbrearty: [name] Other Clinician: Referring Basilia Stuckert: Treating Austynn Pridmore/Extender: Drucilla Schmidt Weeks in Treatment: 56 Visit Information History Since Last Visit Added or deleted any medications: No Patient Arrived: Wheel Chair Any new allergies or adverse reactions: No Arrival Time: 12:44 Had a fall or experienced change in No Accompanied By: alone activities of daily living that may affect Transfer Assistance: Harrel Lemon Lift risk of falls: Patient Identification Verified: Yes Signs or symptoms of abuse/neglect since last visito No Secondary Verification Process Completed: Yes Hospitalized since last visit: No Patient Requires Transmission-Based Precautions: No Implantable device outside of the clinic excluding No Patient Has Alerts: Yes cellular tissue based products placed in the center Patient Alerts: Patient on Blood Thinner since last visit: Has Dressing in Place as Prescribed: Yes Pain Present Now: No Electronic Signature(s) Signed: 06/01/2021 5:40:46 PM By: Levan Hurst RN, BSN Entered By: Levan Hurst on 06/01/2021 12:44:43 -------------------------------------------------------------------------------- Clinic Level of Care Assessment Details Patient Name: Date of Service: Reinaldo Meeker [name] 06/01/2021 12:30 PM Medical Record Number: [id number] Patient Account Number: [id number] Date of Birth/Sex: Treating RN: 1955/06/28 (65 y.o. [name] Primary Care Keyatta Tolles: [name] Other Clinician: Referring Trejuan Matherne: Treating Shawnay Bramel/Extender: Drucilla Schmidt Weeks in Treatment: 32 Clinic Level of Care  Assessment Items TOOL 4 Quantity Score X- 1 0 Use when only an EandM is performed on FOLLOW-UP visit ASSESSMENTS - Nursing Assessment / Reassessment X- 1 10 Reassessment of Co-morbidities (includes updates in patient status) X- 1 5 Reassessment of Adherence to Treatment Plan ASSESSMENTS - Wound and Skin A ssessment / Reassessment []  - 0 Simple Wound Assessment / Reassessment - one wound X- 2 5 Complex Wound Assessment / Reassessment - multiple wounds []  - 0 Dermatologic / Skin Assessment (not related to wound area) ASSESSMENTS - Focused Assessment []  - 0 Circumferential Edema Measurements - multi extremities []  - 0 Nutritional Assessment / Counseling / Intervention []  - 0 Lower Extremity Assessment (monofilament, tuning fork, pulses) []  - 0 Peripheral Arterial Disease Assessment (using hand held doppler) ASSESSMENTS - Ostomy and/or Continence Assessment and Care []  - 0 Incontinence Assessment and Management []  - 0 Ostomy Care Assessment and Management (repouching, etc.) PROCESS - Coordination of Care X - Simple Patient / Family Education for ongoing care 1 15 []  - 0 Complex (extensive) Patient / Family Education for ongoing care X- 1 10 Staff obtains Programmer, systems, Records, T Results / Process Orders est X- 1 10 Staff telephones HHA, Nursing Homes / Clarify orders / etc []  - 0 Routine Transfer to another Facility (non-emergent condition) []  - 0 Routine Hospital Admission (non-emergent condition) []  - 0 New Admissions / Biomedical engineer / Ordering NPWT Apligraf, etc. , []  - 0 Emergency Hospital Admission (emergent condition) X- 1 10 Simple Discharge Coordination []  - 0 Complex (extensive) Discharge Coordination PROCESS - Special Needs []  - 0 Pediatric / Minor Patient Management []  - 0 Isolation Patient Management []  - 0 Hearing / Language / Visual special needs []  - 0 Assessment of Community assistance (transportation, D/C planning, etc.) []  -  0 Additional assistance / Altered mentation []  - 0 Support Surface(s) Assessment (bed, cushion, seat, etc.) INTERVENTIONS - Wound Cleansing / Measurement []  - 0 Simple  Wound Cleansing - one wound X- 2 5 Complex Wound Cleansing - multiple wounds X- 1 5 Wound Imaging (photographs - any number of wounds) []  - 0 Wound Tracing (instead of photographs) []  - 0 Simple Wound Measurement - one wound X- 2 5 Complex Wound Measurement - multiple wounds INTERVENTIONS - Wound Dressings []  - 0 Small Wound Dressing one or multiple wounds X- 1 15 Medium Wound Dressing one or multiple wounds []  - 0 Large Wound Dressing one or multiple wounds []  - 0 Application of Medications - topical []  - 0 Application of Medications - injection INTERVENTIONS - Miscellaneous []  - 0 External ear exam []  - 0 Specimen Collection (cultures, biopsies, blood, body fluids, etc.) []  - 0 Specimen(s) / Culture(s) sent or taken to Lab for analysis X- 1 10 Patient Transfer (multiple staff / Civil Service fast streamer / Similar devices) []  - 0 Simple Staple / Suture removal (25 or less) []  - 0 Complex Staple / Suture removal (26 or more) []  - 0 Hypo / Hyperglycemic Management (close monitor of Blood Glucose) []  - 0 Ankle / Brachial Index (ABI) - do not check if billed separately X- 1 5 Vital Signs Has the patient been seen at the hospital within the last three years: Yes Total Score: 125 Level Of Care: New/Established - Level 4 Electronic Signature(s) Signed: 06/01/2021 5:40:46 PM By: Levan Hurst RN, BSN Entered By: Levan Hurst on 06/01/2021 17:23:35 -------------------------------------------------------------------------------- Encounter Discharge Information Details Patient Name: Date of Service: [name] 06/01/2021 12:30 PM Medical Record Number: [id number] Patient Account Number: [id number] Date of Birth/Sex: Treating RN: 27-Mar-1956 (65 y.o. [name] Primary Care Braxston Quinter: [name]  Other Clinician: Referring Syenna Nazir: Treating Wilberth Damon/Extender: Alycia Rossetti in Treatment: 34 Encounter Discharge Information Items Discharge Condition: Stable Ambulatory Status: Wheelchair Discharge Destination: Home Transportation: Private Auto Accompanied By: alone Schedule Follow-up Appointment: Yes Clinical Summary of Care: Patient Declined Electronic Signature(s) Signed: 06/01/2021 5:40:46 PM By: Levan Hurst RN, BSN Entered By: Levan Hurst on 06/01/2021 17:24:22 -------------------------------------------------------------------------------- Multi Wound Chart Details Patient Name: Date of Service: Reinaldo Meeker [name] 06/01/2021 12:30 PM Medical Record Number: [id number] Patient Account Number: [id number] Date of Birth/Sex: Treating RN: Aug 30, 1955 (65 y.o. [name] Primary Care Heather Streeper: [name] Other Clinician: Referring Abriana Saltos: Treating Amillya Chavira/Extender: Drucilla Schmidt Weeks in Treatment: 32 Vital Signs Height(in): 63 Pulse(bpm): 97 Weight(lbs): 142 Blood Pressure(mmHg): 109/76 Body Mass Index(BMI): 25 Temperature(F): 98.1 Respiratory Rate(breaths/min): 16 Photos: [3:No Photos Sacrum] [5:No Photos Proximal Sacrum] [N/A:N/A N/A] Wound Location: [3:Pressure Injury] [5:Pressure Injury] [N/A:N/A] Wounding Event: [3:Pressure Ulcer] [5:Pressure Ulcer] [N/A:N/A] Primary Etiology: [3:Anemia, Deep Vein Thrombosis,] [5:Anemia, Deep Vein Thrombosis,] [N/A:N/A] Comorbid History: [3:Hypertension, Peripheral Venous Disease, Type II Diabetes 07/20/2020] [5:Hypertension, Peripheral Venous Disease, Type II Diabetes 06/01/2021] [N/A:N/A] Date Acquired: [3:32] [5:0] [N/A:N/A] Weeks of Treatment: [3:Open] [5:Open] [N/A:N/A] Wound Status: [3:3.7x2.5x1.3] [5:2.5x1.3x0.1] [N/A:N/A] Measurements L x W x D (cm) [3:7.265] [5:2.553] [N/A:N/A] A (cm) : rea [3:9.444] [5:0.255] [N/A:N/A] Volume (cm) : [3:-23.30%] [5:N/A]  [N/A:N/A] % Reduction in A rea: [3:-6.90%] [5:N/A] [N/A:N/A] % Reduction in Volume: [3:10] Starting Position 1 (o'clock): [3:2] Ending Position 1 (o'clock): [3:2.2] Maximum Distance 1 (cm): [3:Yes] [5:No] [N/A:N/A] Undermining: [3:Category/Stage IV] [5:Category/Stage III] [N/A:N/A] Classification: [3:Medium] [5:Medium] [N/A:N/A] Exudate A mount: [3:Serosanguineous] [5:Serosanguineous] [N/A:N/A] Exudate Type: [3:red, brown] [5:red, brown] [N/A:N/A] Exudate Color: [3:Well defined, not attached] [5:Flat and Intact] [N/A:N/A] Wound Margin: [3:Large (67-100%)] [5:Large (67-100%)] [N/A:N/A] Granulation A mount: [3:Red, Pink, Friable] [5:Red] [  N/A:N/A] Granulation Quality: [3:Small (1-33%)] [5:Small (1-33%)] [N/A:N/A] Necrotic A mount: [3:Fat Layer (Subcutaneous Tissue): Yes Fat Layer (Subcutaneous Tissue): Yes N/A] Exposed Structures: [3:Muscle: Yes Bone: Yes Fascia: No Tendon: No Joint: No None] [5:Fascia: No Tendon: No Muscle: No Joint: No Bone: No None] [N/A:N/A] Treatment Notes Electronic Signature(s) Signed: 06/01/2021 4:51:07 PM By: Linton Ham MD Signed: 06/01/2021 5:40:46 PM By: Levan Hurst RN, BSN Entered By: Linton Ham on 06/01/2021 13:08:39 -------------------------------------------------------------------------------- Multi-Disciplinary Care Plan Details Patient Name: Date of Service: Reinaldo Meeker [name] 06/01/2021 12:30 PM Medical Record Number: [id number] Patient Account Number: [id number] Date of Birth/Sex: Treating RN: 08-Oct-1955 (65 y.o. [name] Primary Care Lauro Manlove: [name] Other Clinician: Referring Arriona Prest: Treating Kateryna Grantham/Extender: Alycia Rossetti in Treatment: 41 Multidisciplinary Care Plan reviewed with physician Active Inactive Wound/Skin Impairment Nursing Diagnoses: Impaired tissue integrity Knowledge deficit related to ulceration/compromised skin integrity Goals: Patient will have a decrease in  wound volume by X% from date: (specify in notes) Date Initiated: 10/19/2020 Date Inactivated: 03/17/2021 Target Resolution Date: 02/18/2021 Goal Status: Unmet Unmet Reason: glut wound deteriorating Patient/caregiver will verbalize understanding of skin care regimen Date Initiated: 10/19/2020 Target Resolution Date: 07/01/2021 Goal Status: Active Ulcer/skin breakdown will have a volume reduction of 30% by week 4 Date Initiated: 10/19/2020 Date Inactivated: 12/09/2020 Target Resolution Date: 11/19/2020 Unmet Reason: see wound Goal Status: Unmet measurements. Ulcer/skin breakdown will have a volume reduction of 50% by week 8 Date Initiated: 10/19/2020 Date Inactivated: 12/30/2020 Target Resolution Date: 12/16/2020 Unmet Reason: see wound Goal Status: Unmet Goal Status: Unmet measurements. Interventions: Assess patient/caregiver ability to obtain necessary supplies Assess patient/caregiver ability to perform ulcer/skin care regimen upon admission and as needed Assess ulceration(s) every visit Provide education on ulcer and skin care Notes: Electronic Signature(s) Signed: 06/01/2021 5:40:46 PM By: Levan Hurst RN, BSN Entered By: Levan Hurst on 06/01/2021 17:22:14 -------------------------------------------------------------------------------- Pain Assessment Details Patient Name: Date of Service: Reinaldo Meeker [name] 06/01/2021 12:30 PM Medical Record Number: [id number] Patient Account Number: [id number] Date of Birth/Sex: Treating RN: Sep 04, 1955 (65 y.o. [name] Primary Care Jameria Bradway: [name] Other Clinician: Referring Foxx Klarich: Treating Izael Bessinger/Extender: Drucilla Schmidt Weeks in Treatment: 32 Active Problems Location of Pain Severity and Description of Pain Patient Has Paino No Site Locations Pain Management and Medication Current Pain Management: Electronic Signature(s) Signed: 06/01/2021 5:40:46 PM By: Levan Hurst RN, BSN Entered By:  Levan Hurst on 06/01/2021 12:45:05 -------------------------------------------------------------------------------- Patient/Caregiver Education Details Patient Name: Date of Service: Reinaldo Meeker [name] 12/14/2022andnbsp12:30 PM Medical Record Number: [id number] Patient Account Number: [id number] Date of Birth/Gender: Treating RN: 03-May-1956 (65 y.o. [name] Primary Care Physician: [name] Other Clinician: Referring Physician: Treating Physician/Extender: Alycia Rossetti in Treatment: 33 Education Assessment Education Provided To: Patient Education Topics Provided Wound/Skin Impairment: Methods: Explain/Verbal Responses: State content correctly [employer]) Signed: 06/01/2021 5:40:46 PM By: Levan Hurst RN, BSN Entered By: Levan Hurst on 06/01/2021 17:22:28 -------------------------------------------------------------------------------- Wound Assessment Details Patient Name: Date of Service: Reinaldo Meeker [name] 06/01/2021 12:30 PM Medical Record Number: [id number] Patient Account Number: [id number] Date of Birth/Sex: Treating RN: [birth year] (65 y.o. [name] Primary Care Breyden Jeudy: [name] Other Clinician: Referring Lakyn Mantione: Treating Xavien Dauphinais/Extender: Drucilla Schmidt Weeks in Treatment: 32 Wound Status Wound Number: 3 Primary Pressure Ulcer Etiology: Wound Location: Sacrum Wound Open Wounding Event: Pressure Injury Status: Date Acquired: 07/20/2020 Comorbid Anemia, Deep Vein Thrombosis,  Hypertension, Peripheral Weeks Of Treatment: 32 History: Venous Disease, Type II Diabetes Clustered Wound: No Wound Measurements Length: (cm) 3.7 Width: (cm) 2.5 Depth: (cm) 1.3 Area: (cm) 7.265 Volume: (cm) 9.444 % Reduction in Area: -23.3% % Reduction in Volume: -6.9% Epithelialization: None Tunneling: No Undermining: Yes Starting Position (o'clock): 10 Ending Position  (o'clock): 2 Maximum Distance: (cm) 2.2 Wound Description Classification: Category/Stage IV Wound Margin: Well defined, not attached Exudate Amount: Medium Exudate Type: Serosanguineous Exudate Color: red, brown Foul Odor After Cleansing: No Slough/Fibrino Yes Wound Bed Granulation Amount: Large (67-100%) Exposed Structure Granulation Quality: Red, Pink, Friable Fascia Exposed: No Necrotic Amount: Small (1-33%) Fat Layer (Subcutaneous Tissue) Exposed: Yes Necrotic Quality: Adherent Slough Tendon Exposed: No Muscle Exposed: Yes Necrosis of Muscle: No Joint Exposed: No Bone Exposed: Yes Treatment Notes Wound #3 (Sacrum) Cleanser Soap and Water Discharge Instruction: May shower and wash wound with dial antibacterial soap and water prior to dressing change. Wound Cleanser Discharge Instruction: Cleanse the wound with wound cleanser prior to applying a clean dressing using gauze sponges, not tissue or cotton balls. Peri-Wound Care Skin Prep Discharge Instruction: Use skin prep as directed Topical Primary Dressing KerraCel Ag Gelling Fiber Dressing, 4x5 in (silver alginate) Discharge Instruction: Apply silver alginate to wound bed as instructed Secondary Dressing Woven Gauze Sponge, Non-Sterile 4x4 in Discharge Instruction: Apply over primary dressing as directed. MPM Excel SAP Bordered Dressing, 7x6.7 (Sacral) (in/in) Discharge Instruction: Apply silicone border over primary dressing as directed. Secured With Compression Wrap Compression Stockings Environmental education officer) Signed: 06/01/2021 5:40:46 PM By: Levan Hurst RN, BSN Entered By: Levan Hurst on 06/01/2021 12:58:11 -------------------------------------------------------------------------------- Wound Assessment Details Patient Name: Date of Service: Reinaldo Meeker [name] 06/01/2021 12:30 PM Medical Record Number: [id number] Patient Account Number: [id number] Date of Birth/Sex: Treating RN: [birth year]  (65 y.o. [name] Primary Care Derrill Bagnell: [name] Other Clinician: Referring Alizah Sills: Treating Stefani Baik/Extender: Drucilla Schmidt Weeks in Treatment: 32 Wound Status Wound Number: 5 Primary Pressure Ulcer Etiology: Wound Location: Proximal Sacrum Wound Open Wounding Event: Pressure Injury Status: Date Acquired: 06/01/2021 Comorbid Anemia, Deep Vein Thrombosis, Hypertension, Peripheral Weeks Of Treatment: 0 History: Venous Disease, Type II Diabetes Clustered Wound: No Wound Measurements Length: (cm) 2.5 Width: (cm) 1.3 Depth: (cm) 0.1 Area: (cm) 2.553 Volume: (cm) 0.255 % Reduction in Area: % Reduction in Volume: Epithelialization: None Tunneling: No Undermining: No Wound Description Classification: Category/Stage III Wound Margin: Flat and Intact Exudate Amount: Medium Exudate Type: Serosanguineous Exudate Color: red, brown Foul Odor After Cleansing: No Slough/Fibrino Yes Wound Bed Granulation Amount: Large (67-100%) Exposed Structure Granulation Quality: Red Fascia Exposed: No Necrotic Amount: Small (1-33%) Fat Layer (Subcutaneous Tissue) Exposed: Yes Necrotic Quality: Adherent Slough Tendon Exposed: No Muscle Exposed: No Joint Exposed: No Bone Exposed: No Treatment Notes Wound #5 (Sacrum) Wound Laterality: Proximal Cleanser Soap and Water Discharge Instruction: May shower and wash wound with dial antibacterial soap and water prior to dressing change. Wound Cleanser Discharge Instruction: Cleanse the wound with wound cleanser prior to applying a clean dressing using gauze sponges, not tissue or cotton balls. Peri-Wound Care Skin Prep Discharge Instruction: Use skin prep as directed Topical Primary Dressing KerraCel Ag Gelling Fiber Dressing, 4x5 in (silver alginate) Discharge Instruction: Apply silver alginate to wound bed as instructed Secondary Dressing Woven Gauze Sponge, Non-Sterile 4x4 in Discharge Instruction:  Apply over primary dressing as directed. MPM Excel SAP Bordered Dressing, 7x6.7 (Sacral) (in/in) Discharge Instruction: Apply silicone border over primary dressing as directed. Secured With  Compression Wrap Compression Stockings Add-Ons Electronic Signature(s) Signed: 06/01/2021 5:40:46 PM By: Levan Hurst RN, BSN Entered By: Levan Hurst on 06/01/2021 13:00:39 -------------------------------------------------------------------------------- Vitals Details Patient Name: Date of Service: Ledora Bottcher, Lindie Spruce [name] 06/01/2021 12:30 PM Medical Record Number: [id number] Patient Account Number: [id number] Date of Birth/Sex: Treating RN: [birth year] (65 y.o. [name] Primary Care Bo Rogue: [name] Other Clinician: Referring Sergei Delo: Treating Hakop Humbarger/Extender: Drucilla Schmidt Weeks in Treatment: 32 Vital Signs Time Taken: 12:44 Temperature (F): 98.1 Height (in): 63 Pulse (bpm): 97 Weight (lbs): 142 Respiratory Rate (breaths/min): 16 Body Mass Index (BMI): 25.2 Blood Pressure (mmHg): 109/76 Reference Range: 80 - 120 mg / dl Electronic Signature(s) Signed: 06/01/2021 5:40:46 PM By: Levan Hurst RN, BSN Entered By: Levan Hurst on 06/01/2021 12:44:59

## 2021-06-02 NOTE — Progress Notes (Signed)
Jocelyn Sanchez ([id number]) Visit Report for 06/01/2021 HPI Details Patient Name: Date of Service: Jocelyn Sanchez [name] 06/01/2021 12:30 PM Medical Record Number: [id number] Patient Account Number: [id number] Date of Birth/Sex: Treating RN: [birth year] (65 y.o. Jocelyn Sanchez Primary Care Provider: Birdie Sanchez Other Clinician: Referring Provider: Treating Provider/Extender: Jocelyn Sanchez in Treatment: 32 History of Present Illness Location: Midline sacral region Quality: Patient describes this as a soreness Severity: 2 out of 10 Duration: Greater than one year prior to presentation to the wound center Timing: Pain occurs most often with dressing changes Context: Developmental result of pressure over time to the sacral region Modifying Factors: Patient has previously had this wound packed with gauze, she has had a wound VAC, and she tells me other various wound care treatments over the past year. ssociated Signs and Symptoms: Multiple sclerosis, hypertension, diabetes mellitus type 2, lymphedema A HPI Description: 03/27/16 patient presents today on initial evaluation concerning her sacral pressure injury which has been present for roughly one year. She tells me that she does not know of any x-rays or other advanced imaging has been performed in regard to the sacral region. She has been provided with wound care at the [facility] facility at this point in time. She has MS and is not able to move she tells me "at all". She does tell me however that the facility is very good about re-positioning her at least every 2 hours. With that being said she did fell the wound VAC apparently. She had this twice the first time she tells me that it did not seem to be working very well and the second time it actually made things worse this was just 2 months ago. Therefore this was discontinued. at this point in time today she is not having any signs or symptoms of  systemic infection she is on a blood thinner which is Eliquis at this point in time. 03/24/16; patient admitted to our clinic by Jocelyn Sanchez last week. She is a lady with advanced MS who is had a wound on her lower sacral area for most of the last year. She reiterates to me that wound vacs have not worked for her in fact she states they've actually made her wound worse on the last occasion. An x- ray was apparently done in the facility although I don't have these results. 03/31/16; this patient had an x-ray done at the facility that did not show evidence of osteomyelitis. Her bone biopsy did not show osteomyelitis. 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 She had been suggested to have collagen on the base of the wound covered with normal saline wet to dry although her intake nurse reports there was no collagen on the wound surface. 05/15/16 wound is not changed that much. This is a substantial stage IV wound with considerable undermining from 7 to 3:00. I have spoken to the wound care nurse at the [facility] facility, there was no records on what exactly caused the wound VAC to fail or records of the same. 06/05/16; I received communication from [facility] skilled facility reporting that the patient refuses the wound back more often that it's on. When I confronted her with this today in the room she denied that she was responsible stating for the most part the  problem is because she has trouble getting them to deal with her Foley catheter properly. I don't exactly follow her line of reasoning here however apparently they've been putting hydrogel wet to dry and in the facility 07/03/16; I have not heard anything from [place]. The patient states she has not had a wound VAC the last 2-3 days due to "battery  problems". She arrives today with wet to dry dressing. She has advanced MS and is a chronic care resident at the skilled facility 07/31/16; patient is a resident of [facility] skilled facility. She has had the VAC applied but did not arrive with the [facility] in place. Her wound generally looks better perhaps somewhat less tunneling superiorly. There is still some exposed bone however. 09/11/16; I follow this patient with advanced MS who is a resident of [facility] skilled facility on a monthly basis. She arrives 6 Sanchez after her last visit. The wound VAC apparently is no longer being used according to the patient because the nurses in the facility could not maintain a seal in this area. Via the patient it seems that they're using some form of wet to dry dressing although he received no documentation. The patient states she is eating well she is being turned in the facility. 10/09/16; I follow this patient with advanced MS who is a resident of [facility]. She apparently could not have a wound VAC is the facility could not maintain a seal therefore they're using wet-to-dry dressings however the patient arrives today with better measurements and a nicely granulated wound bed. 11/05/16; patient with advanced MS was a pressure area on the lower sacrum/coccyx. She could not have a wound VAC because the facility could n/ot maintain a seal. I ordered Prisma last time. Wound dimensions are about the same per intake 12/04/16; pressure area on the lower sacrum. using collagen. Has a new concern on the right great toe 01/01/17; patient has now only a small open area on the lower sacrumcoccyx she is been doing well and this wound is gradually progressing towards closure. The area on the right great toe that looked to me like a paronychia last time is also quite a bit better although the nail here looks as though it's loosening READMISSION 10/19/2020 This is a patient with advanced multiple sclerosis. We had her in clinic  for a prolonged period in late 2017 to the summer 2018 with a stage IV wound on her lower sacrum/coccyx. I do not think we healed her out although her wound was a lot better. She was a resident at [facility] skilled facility at the time The patient states the wound reopened about 3 or 4 months ago. I am not completely certain from talking to the patient if this is in the same position as last time or even indeed if it ever closed. 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 She has a home health aide who comes twice a day. She had an air mattress but apparently it does not work As far as I can tell there is not been any major change in her medical problem list except she lives at home now. She is a diabetic on metformin 6/23; patient has not been here in about 6-7 Sanchez. I am not sure what the issue is and the patient does not seem to really know. She takes scat for transportation although that has not  been a problem before. We changed her dressing last time she was here to silver collagen moistened with the backing wet- to-dry and a foam border. Her wound is quite a bit better looking. 7/14; 3-week follow-up. The patient's wound is not as good as last time. It is wider I think more undermining superiorly. Also there appears to be superficial skin breakdown superiorly and the skin around the wound and also from about 2-4 o'clock. Not a particularly viable surface. We have been using silver collagen and backing wet-to-dry. The patient lives in her own home. She apparently has care attendance for almost all of the day. She has a surface on her bed but this is clearly not a level 3. She also has home health. She claims to be eating well 8/4; patient presents for 3-week follow-up. She has no issues or complaints today. She reports using wet-to-dry dressings for her sacral wound and collagen to the  buttocks wound. She states that her new mattress is arriving today 9/29; its been almost 2 months since the patient was in the clinic. She has a sacral wound which is worse I think with exposed bone to the right under the undermining area. Also concerning is a marked deterioration of her left buttock. With a superficial necrotic area and several small superficial areas. She has been using silver collagen wet-to-dry. The patient has a level 2 surface certainly would benefit from a level 3 if that can be provided. She is relatively immobile cannot turn her self secondary to multiple sclerosis. She tells me she has an aide for most of the day except for 4 hours. She also states she is not up much in a wheelchair. I wonder about her oral intake although she says she eats 3 meals a day and takes Glucerna 10/13; the patient looks somewhat better than 2 Sanchez ago today. I gave her empiric Augmentin which she took a mobile x-ray was negative for osteomyelitis but they suggested a bone scan. The wound area actually looks somewhat better today 10/27; comes in today with exposed bone complaints of more pain and odor. She looks as though she is losing weight although she claims to be eating well. She takes Ensure 11/16; patient came in 2 Sanchez ago with exposed bone. I did a biopsy this was negative for osteomyelitis. Bone culture was also negative showing multiple organisms. I believe I did give her Augmentin which gave her some diarrhea but she does not need further antibiotic. We are using silver alginate to this wound and also the closely position superficial area on the skin just above the wound. She managed to get a level 3 surface that we had ordered she says she does not like it because it is hot and noisy. But nevertheless she is using a 12/14; 1 month follow-up. The patient has 2 wounds her original deep wound on the sacrum which initially had exposed bone. This was cultured and surprisingly negative.  She also superiorly had a more superficial but not a healthy looking wound above it. We are using silver alginate on both wounds. Things look improved today. She has completed her antibiotics. Given the negative culture and biopsy of the bone here I do not think any more systemic antibiotics are indicated Electronic Signature(s) Signed: 06/01/2021 4:51:07 PM By: Linton Ham MD Entered By: Linton Ham on 06/01/2021 13:10:13 -------------------------------------------------------------------------------- Physical Exam Details Patient Name: Date of Service: Jocelyn Sanchez [name] 06/01/2021 12:30 PM Medical Record Number: [id number] Patient Account Number:  [id number] Date of Birth/Sex: Treating RN: Mar 27, 1956 (65 y.o. Jocelyn Sanchez Primary Care Provider: Birdie Sanchez Other Clinician: Referring Provider: Treating Provider/Extender: Jocelyn Sanchez in Treatment: 32 Constitutional Sitting or standing Blood Pressure is within target range for patient.. Pulse regular and within target range for patient.Marland Kitchen Respirations regular, non-labored and within target range.. Temperature is normal and within the target range for the patient.Marland Kitchen Appears in no distress. Notes Wound exam; the wound is 100% covered with granulation tissue. Somewhat better. She now has normal skin separating the 2 wounds the superficial wound above this also looks healthy. No evidence of surrounding infection. No crepitus Electronic Signature(s) Signed: 06/01/2021 4:51:07 PM By: Linton Ham MD Entered By: Linton Ham on 06/01/2021 13:11:02 -------------------------------------------------------------------------------- Physician Orders Details Patient Name: Date of Service: Jocelyn Sanchez [name] 06/01/2021 12:30 PM Medical Record Number: [id number] Patient Account Number: [id number] Date of Birth/Sex: Treating RN: Oct 22, 1955 (65 y.o. Jocelyn Sanchez Primary Care Provider: Birdie Sanchez  Other Clinician: Referring Provider: Treating Provider/Extender: Jocelyn Sanchez in Treatment: 56 Verbal / Phone Orders: No Diagnosis Coding ICD-10 Coding Code Description L89.154 Pressure ulcer of sacral region, stage 4 G35 Multiple sclerosis L89.319 Pressure ulcer of right buttock, unspecified stage L03.818 Cellulitis of other sites Follow-up Appointments Return appointment in 1 month. - Dr. Dellia Nims ***HOY[facility]*** Bathing/ Shower/ Hygiene May shower with protection but do not get wound dressing(s) wet. - May shower and use soap and water on days dressings are changed Off-Loading A fluidized (Group 3) mattress ir Turn and reposition every 2 hours Home Health No change in wound care orders this week; continue Home Health for wound care. May utilize formulary equivalent dressing for wound treatment orders unless otherwise specified. Dressing changes to be completed by [place] on Monday / Wednesday / Friday except when patient has scheduled visit at [facility]. Other Home Health Orders/Instructions: - [facility] Wound Treatment Wound #3 - Sacrum Cleanser: Soap and Water [facility]) Every Other Day/30 Days Discharge Instructions: May shower and wash wound with dial antibacterial soap and water prior to dressing change. Cleanser: Wound Cleanser [facility]) Every Other Day/30 Days Discharge Instructions: Cleanse the wound with wound cleanser prior to applying a clean dressing using gauze sponges, not tissue or cotton balls. Peri-Wound Care: Skin Prep [facility]) Every Other Day/30 Days Discharge Instructions: Use skin prep as directed Prim Dressing: KerraCel Ag Gelling Fiber Dressing, 4x5 in (silver alginate) (Home Health) Every Other Day/30 Days ary Discharge Instructions: Apply silver alginate to wound bed as instructed Secondary Dressing: Woven Gauze Sponge, Non-Sterile 4x4 in Every Other Day/30 Days Discharge Instructions: Apply over primary  dressing as directed. Secondary Dressing: MPM Excel SAP Bordered Dressing, 7x6.7 (Sacral) (in/in) (Home Health) Every Other Day/30 Days Discharge Instructions: Apply silicone border over primary dressing as directed. Wound #5 - Sacrum Wound Laterality: Proximal Cleanser: Soap and Water [facility]) Every Other Day/30 Days Discharge Instructions: May shower and wash wound with dial antibacterial soap and water prior to dressing change. Cleanser: Wound Cleanser [facility]) Every Other Day/30 Days Discharge Instructions: Cleanse the wound with wound cleanser prior to applying a clean dressing using gauze sponges, not tissue or cotton balls. Peri-Wound Care: Skin Prep [facility]) Every Other Day/30 Days Discharge Instructions: Use skin prep as directed Prim Dressing: KerraCel Ag Gelling Fiber Dressing, 4x5 in (silver alginate) (Home Health) Every Other Day/30 Days ary Discharge Instructions: Apply silver alginate to wound bed as instructed Secondary Dressing:  Woven Gauze Sponge, Non-Sterile 4x4 in Every Other Day/30 Days Discharge Instructions: Apply over primary dressing as directed. Secondary Dressing: MPM Excel SAP Bordered Dressing, 7x6.7 (Sacral) (in/in) (Home Health) Every Other Day/30 Days Discharge Instructions: Apply silicone border over primary dressing as directed. Electronic Signature(s) Signed: 06/01/2021 4:51:07 PM By: Linton Ham MD Signed: 06/01/2021 5:40:46 PM By: Levan Hurst RN, BSN Entered By: Levan Hurst on 06/01/2021 13:08:35 -------------------------------------------------------------------------------- Problem List Details Patient Name: Date of Service: Jocelyn Sanchez [name] 06/01/2021 12:30 PM Medical Record Number: [id number] Patient Account Number: [id number] Date of Birth/Sex: Treating RN: [birth year] (65 y.o. Jocelyn Sanchez Primary Care Provider: Birdie Sanchez Other Clinician: Referring Provider: Treating Provider/Extender: Jocelyn Sanchez in Treatment: 8 Active Problems ICD-10 Encounter Code Description Active Date MDM Diagnosis L89.154 Pressure ulcer of sacral region, stage 4 10/19/2020 No Yes G35 Multiple sclerosis 10/19/2020 No Yes L89.319 Pressure ulcer of right buttock, unspecified stage 03/17/2021 No Yes L03.818 Cellulitis of other sites 03/17/2021 No Yes Inactive Problems Resolved Problems Electronic Signature(s) Signed: 06/01/2021 4:51:07 PM By: Linton Ham MD Entered By: Linton Ham on 06/01/2021 13:08:33 -------------------------------------------------------------------------------- Progress Note Details Patient Name: Date of Service: Jocelyn Sanchez [name] 06/01/2021 12:30 PM Medical Record Number: [id number] Patient Account Number: [id number] Date of Birth/Sex: Treating RN: 09-Jun-1956 (65 y.o. Jocelyn Sanchez Primary Care Provider: Birdie Sanchez Other Clinician: Referring Provider: Treating Provider/Extender: Jocelyn Sanchez in Treatment: 32 Subjective History of Present Illness (HPI) The following HPI elements were documented for the patient's wound: Location: Midline sacral region Quality: Patient describes this as a soreness Severity: 2 out of 10 Duration: Greater than one year prior to presentation to the wound center Timing: Pain occurs most often with dressing changes Context: Developmental result of pressure over time to the sacral region Modifying Factors: Patient has previously had this wound packed with gauze, she has had a wound VAC, and she tells me other various wound care treatments over the past year. Associated Signs and Symptoms: Multiple sclerosis, hypertension, diabetes mellitus type 2, lymphedema 03/27/16 patient presents today on initial evaluation concerning her sacral pressure injury which has been present for roughly one year. She tells me that she does not know of any x-rays or other advanced imaging has been  performed in regard to the sacral region. She has been provided with wound care at the [facility] facility at this point in time. She has MS and is not able to move she tells me "at all". She does tell me however that the facility is very good about re-positioning her at least every 2 hours. With that being said she did fell the wound VAC apparently. She had this twice the first time she tells me that it did not seem to be working very well and the second time it actually made things worse this was just 2 months ago. Therefore this was discontinued. at this point in time today she is not having any signs or symptoms of systemic infection she is on a blood thinner which is Eliquis at this point in time. 03/24/16; patient admitted to our clinic by Jocelyn Sanchez last week. She is a lady with advanced MS who is had a wound on her lower sacral area for most of the last year. She reiterates to me that wound vacs have not worked for her in fact she states they've actually made her wound worse on the last occasion. An x- ray was apparently  done in the facility although I don't have these results. 03/31/16; this patient had an x-ray done at the facility that did not show evidence of osteomyelitis. Her bone biopsy did not show osteomyelitis. 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 She had been suggested to have collagen on the base of the wound covered with normal saline wet to dry although her intake nurse reports there was no collagen on the wound surface. 05/15/16 wound is not changed that much. This is a substantial stage IV wound with considerable undermining from 7 to 3:00. I have spoken to the wound care nurse at the [facility] facility, there was no records on what exactly caused the wound VAC to fail or  records of the same. 06/05/16; I received communication from [facility] skilled facility reporting that the patient refuses the wound back more often that it's on. When I confronted her with this today in the room she denied that she was responsible stating for the most part the problem is because she has trouble getting them to deal with her Foley catheter properly. I don't exactly follow her line of reasoning here however apparently they've been putting hydrogel wet to dry and in the facility 07/03/16; I have not heard anything from [facility]. The patient states she has not had a wound VAC the last 2-3 days due to "battery problems". She arrives today with wet to dry dressing. She has advanced MS and is a chronic care resident at the skilled facility 07/31/16; patient is a resident of [facility] skilled facility. She has had the VAC applied but did not arrive with the [facility] in place. Her wound generally looks better perhaps somewhat less tunneling superiorly. There is still some exposed bone however. 09/11/16; I follow this patient with advanced MS who is a resident of [facility] skilled facility on a monthly basis. She arrives 6 Sanchez after her last visit. The wound VAC apparently is no longer being used according to the patient because the nurses in the facility could not maintain a seal in this area. Via the patient it seems that they're using some form of wet to dry dressing although he received no documentation. The patient states she is eating well she is being turned in the facility. 10/09/16; I follow this patient with advanced MS who is a resident of [facility]. She apparently could not have a wound VAC is the facility could not maintain a seal therefore they're using wet-to-dry dressings however the patient arrives today with better measurements and a nicely granulated wound bed. 11/05/16; patient with advanced MS was a pressure area on the lower sacrum/coccyx. She could not have a wound VAC  because the facility could n/ot maintain a seal. I ordered Prisma last time. Wound dimensions are about the same per intake 12/04/16; pressure area on the lower sacrum. using collagen. Has a new concern on the right great toe 01/01/17; patient has now only a small open area on the lower sacrumoococcyx she is been doing well and this wound is gradually progressing towards closure. The area on the right great toe that looked to me like a paronychia last time is also quite a bit better although the nail here looks as though it's loosening READMISSION 10/19/2020 This is a patient with advanced multiple sclerosis. We had her in clinic for a prolonged period  in late 2017 to the summer 2018 with a stage IV wound on her lower sacrum/coccyx. I do not think we healed her out although her wound was a lot better. She was a resident at [facility] facility at the time The patient states the wound reopened about 3 or 4 months ago. I am not completely certain from talking to the patient if this is in the same position as last time or even indeed if it ever closed. 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 She has a home health aide who comes twice a day. She had an air mattress but apparently it does not work As far as I can tell there is not been any major change in her medical problem list except she lives at home now. She is a diabetic on metformin 6/23; patient has not been here in about 6-7 Sanchez. I am not sure what the issue is and the patient does not seem to really know. She takes scat for transportation although that has not been a problem before. We changed her dressing last time she was here to silver collagen moistened with the backing wet- to-dry and a foam border. Her wound is quite a bit better looking. 7/14; 3-week follow-up. The patient's wound is not as good as last time. It is wider  I think more undermining superiorly. Also there appears to be superficial skin breakdown superiorly and the skin around the wound and also from about 2-4 o'clock. Not a particularly viable surface. We have been using silver collagen and backing wet-to-dry. The patient lives in her own home. She apparently has care attendance for almost all of the day. She has a surface on her bed but this is clearly not a level 3. She also has home health. She claims to be eating well 8/4; patient presents for 3-week follow-up. She has no issues or complaints today. She reports using wet-to-dry dressings for her sacral wound and collagen to the buttocks wound. She states that her new mattress is arriving today 9/29; its been almost 2 months since the patient was in the clinic. She has a sacral wound which is worse I think with exposed bone to the right under the undermining area. Also concerning is a marked deterioration of her left buttock. With a superficial necrotic area and several small superficial areas. She has been using silver collagen wet-to-dry. The patient has a level 2 surface certainly would benefit from a level 3 if that can be provided. She is relatively immobile cannot turn her self secondary to multiple sclerosis. She tells me she has an aide for most of the day except for 4 hours. She also states she is not up much in a wheelchair. I wonder about her oral intake although she says she eats 3 meals a day and takes Glucerna 10/13; the patient looks somewhat better than 2 Sanchez ago today. I gave her empiric Augmentin which she took a mobile x-ray was negative for osteomyelitis but they suggested a bone scan. The wound area actually looks somewhat better today 10/27; comes in today with exposed bone complaints of more pain and odor. She looks as though she is losing weight although she claims to be eating well. She takes Ensure 11/16; patient came in 2 Sanchez ago with exposed bone. I did a biopsy this  was negative for osteomyelitis. Bone culture  was also negative showing multiple organisms. I believe I did give her Augmentin which gave her some diarrhea but she does not need further antibiotic. We are using silver alginate to this wound and also the closely position superficial area on the skin just above the wound. She managed to get a level 3 surface that we had ordered she says she does not like it because it is hot and noisy. But nevertheless she is using a 12/14; 1 month follow-up. The patient has 2 wounds her original deep wound on the sacrum which initially had exposed bone. This was cultured and surprisingly negative. She also superiorly had a more superficial but not a healthy looking wound above it. We are using silver alginate on both wounds. Things look improved today. She has completed her antibiotics. Given the negative culture and biopsy of the bone here I do not think any more systemic antibiotics are indicated Objective Constitutional Sitting or standing Blood Pressure is within target range for patient.. Pulse regular and within target range for patient.Marland Kitchen Respirations regular, non-labored and within target range.. Temperature is normal and within the target range for the patient.Marland Kitchen Appears in no distress. Vitals Time Taken: 12:44 PM, Height: 63 in, Weight: 142 lbs, BMI: 25.2, Temperature: 98.1 F, Pulse: 97 bpm, Respiratory Rate: 16 breaths/min, Blood Pressure: 109/76 mmHg. General Notes: Wound exam; the wound is 100% covered with granulation tissue. Somewhat better. She now has normal skin separating the 2 wounds the superficial wound above this also looks healthy. No evidence of surrounding infection. No crepitus Integumentary (Hair, Skin) Wound #3 status is Open. Original cause of wound was Pressure Injury. The date acquired was: 07/20/2020. The wound has been in treatment 32 Sanchez. The wound is located on the Sacrum. The wound measures 3.7cm length x 2.5cm width x 1.3cm  depth; 7.265cm^2 area and 9.444cm^3 volume. There is bone, muscle, and Fat Layer (Subcutaneous Tissue) exposed. There is no tunneling noted, however, there is undermining starting at 10:00 and ending at 2:00 with a maximum distance of 2.2cm. There is a medium amount of serosanguineous drainage noted. The wound margin is well defined and not attached to the wound base. There is large (67-100%) red, pink, friable granulation within the wound bed. There is a small (1-33%) amount of necrotic tissue within the wound bed including Adherent Slough. Wound #5 status is Open. Original cause of wound was Pressure Injury. The date acquired was: 06/01/2021. The wound is located on the Proximal Sacrum. The wound measures 2.5cm length x 1.3cm width x 0.1cm depth; 2.553cm^2 area and 0.255cm^3 volume. There is Fat Layer (Subcutaneous Tissue) exposed. There is no tunneling or undermining noted. There is a medium amount of serosanguineous drainage noted. The wound margin is flat and intact. There is large (67-100%) red granulation within the wound bed. There is a small (1-33%) amount of necrotic tissue within the wound bed including Adherent Slough. Assessment Active Problems ICD-10 Pressure ulcer of sacral region, stage 4 Multiple sclerosis Pressure ulcer of right buttock, unspecified stage Cellulitis of other sites Plan Follow-up Appointments: Return appointment in 1 month. - Dr. Dellia Nims ***HOYER*** Bathing/ Shower/ Hygiene: May shower with protection but do not get wound dressing(s) wet. - May shower and use soap and water on days dressings are changed Off-Loading: Air fluidized (Group 3) mattress Turn and reposition every 2 hours Home Health: No change in wound care orders this week; continue Home Health for wound care. May utilize formulary equivalent dressing for wound treatment orders unless otherwise specified. Dressing  changes to be completed by Home Health on Monday / Wednesday / Friday except when  patient has scheduled visit at [facility]. Other Home Health Orders/Instructions: - [facility] WOUND #3: - Sacrum Wound Laterality: Cleanser: Soap and Water [facility]) Every Other Day/30 Days Discharge Instructions: May shower and wash wound with dial antibacterial soap and water prior to dressing change. Cleanser: Wound Cleanser [facility]) Every Other Day/30 Days Discharge Instructions: Cleanse the wound with wound cleanser prior to applying a clean dressing using gauze sponges, not tissue or cotton balls. Peri-Wound Care: Skin Prep [facility]) Every Other Day/30 Days Discharge Instructions: Use skin prep as directed Prim Dressing: KerraCel Ag Gelling Fiber Dressing, 4x5 in (silver alginate) (Home Health) Every Other Day/30 Days ary Discharge Instructions: Apply silver alginate to wound bed as instructed Secondary Dressing: Woven Gauze Sponge, Non-Sterile 4x4 in Every Other Day/30 Days Discharge Instructions: Apply over primary dressing as directed. Secondary Dressing: MPM Excel SAP Bordered Dressing, 7x6.7 (Sacral) (in/in) (Home Health) Every Other Day/30 Days Discharge Instructions: Apply silicone border over primary dressing as directed. WOUND #5: - Sacrum Wound Laterality: Proximal Cleanser: Soap and Water [facility]) Every Other Day/30 Days Discharge Instructions: May shower and wash wound with dial antibacterial soap and water prior to dressing change. Cleanser: Wound Cleanser [facility]) Every Other Day/30 Days Discharge Instructions: Cleanse the wound with wound cleanser prior to applying a clean dressing using gauze sponges, not tissue or cotton balls. Peri-Wound Care: Skin Prep [facility]) Every Other Day/30 Days Discharge Instructions: Use skin prep as directed Prim Dressing: KerraCel Ag Gelling Fiber Dressing, 4x5 in (silver alginate) (Home Health) Every Other Day/30 Days ary Discharge Instructions: Apply silver alginate to wound bed as instructed Secondary  Dressing: Woven Gauze Sponge, Non-Sterile 4x4 in Every Other Day/30 Days Discharge Instructions: Apply over primary dressing as directed. Secondary Dressing: MPM Excel SAP Bordered Dressing, 7x6.7 (Sacral) (in/in) (Home Health) Every Other Day/30 Days Discharge Instructions: Apply silicone border over primary dressing as directed. #1 I continued with the silver alginate for at least another 2 or 3 Sanchez. Like to change to silver collagen in the deeper wound 2. If I can get the more superficial wound healed I might consider a wound VAC if the accompanied logistics can be arranged Electronic Signature(s) Signed: 06/01/2021 4:51:07 PM By: Linton Ham MD Entered By: Linton Ham on 06/01/2021 13:12:26 -------------------------------------------------------------------------------- SuperBill Details Patient Name: Date of Service: Jocelyn Sanchez [name] 06/01/2021 Medical Record Number: [id number] Patient Account Number: [id number] Date of Birth/Sex: Treating RN: 1956-01-19 (65 y.o. Jocelyn Sanchez Primary Care Provider: Birdie Sanchez Other Clinician: Referring Provider: Treating Provider/Extender: Jocelyn Sanchez in Treatment: 32 Diagnosis Coding ICD-10 Codes Code Description L89.154 Pressure ulcer of sacral region, stage 4 G35 Multiple sclerosis L89.319 Pressure ulcer of right buttock, unspecified stage L03.818 Cellulitis of other sites Facility Procedures CPT4 Code: [id number] Description: 99214 - WOUND CARE VISIT-LEV 4 EST PT Modifier: Quantity: 1 Physician Procedures : CPT4 Code Description Modifier [id number] 99213 - WC PHYS LEVEL 3 - EST PT ICD-10 Diagnosis Description L89.154 Pressure ulcer of sacral region, stage 4 L89.319 Pressure ulcer of right buttock, unspecified stage G35 Multiple sclerosis Quantity: 1 Electronic Signature(s) Signed: 06/01/2021 5:40:46 PM By: Levan Hurst RN, BSN Signed: 06/02/2021 4:39:12 PM By: Linton Ham MD Previous  Signature: 06/01/2021 4:51:07 PM Version By: Linton Ham MD Entered By: Levan Hurst on 06/01/2021 17:23:43

## 2021-06-29 ENCOUNTER — Encounter (HOSPITAL_BASED_OUTPATIENT_CLINIC_OR_DEPARTMENT_OTHER): Payer: Medicare Other | Admitting: Internal Medicine

## 2021-08-03 ENCOUNTER — Encounter (HOSPITAL_BASED_OUTPATIENT_CLINIC_OR_DEPARTMENT_OTHER): Payer: Medicare Other | Admitting: Internal Medicine

## 2021-09-08 ENCOUNTER — Other Ambulatory Visit: Payer: Self-pay

## 2021-09-08 ENCOUNTER — Encounter (HOSPITAL_BASED_OUTPATIENT_CLINIC_OR_DEPARTMENT_OTHER): Payer: Medicare Other | Attending: Internal Medicine | Admitting: Internal Medicine

## 2021-09-08 DIAGNOSIS — I110 Hypertensive heart disease with heart failure: Secondary | ICD-10-CM | POA: Insufficient documentation

## 2021-09-08 DIAGNOSIS — Z86718 Personal history of other venous thrombosis and embolism: Secondary | ICD-10-CM | POA: Diagnosis not present

## 2021-09-08 DIAGNOSIS — I509 Heart failure, unspecified: Secondary | ICD-10-CM | POA: Insufficient documentation

## 2021-09-08 DIAGNOSIS — G35 Multiple sclerosis: Secondary | ICD-10-CM | POA: Diagnosis not present

## 2021-09-08 DIAGNOSIS — I890 Lymphedema, not elsewhere classified: Secondary | ICD-10-CM | POA: Diagnosis not present

## 2021-09-08 DIAGNOSIS — E11622 Type 2 diabetes mellitus with other skin ulcer: Secondary | ICD-10-CM | POA: Insufficient documentation

## 2021-09-08 DIAGNOSIS — L89153 Pressure ulcer of sacral region, stage 3: Secondary | ICD-10-CM | POA: Diagnosis not present

## 2021-09-08 NOTE — Progress Notes (Signed)
Jocelyn Sanchez, Jocelyn Sanchez ([id number]) ?Visit Report for 09/08/2021 ?Chief Complaint Document Details ?Patient Name: Date of Service: ?Jocelyn ES, [name] 09/08/2021 1:15 PM ?Medical Record Number: [id number] ?Patient Account Number: [id number] ?Date of Birth/Sex: Treating RN: ?1956-02-10 (66 y.o. F) ?Primary Care Provider: [name] Other Clinician: ?Referring Provider: ?Treating Provider/Extender: [name] ?[name] ?Weeks in Treatment: 0 ?Information Obtained from: Patient ?Chief Complaint ?evaluation for sacral pressure ulcer ?10/19/2020; patient is here for review of a sacral pressure ulcer which I think is in the same position as last time ?3/23/2023patient presents for review of sacral pressure ulcer that has not healed in 6 years. She has been followed in our clinic for the past 6 years for this ?issue. She was last seen 05/2021 ?Electronic Signature(s) ?Signed: 09/08/2021 2:45:16 PM By: [name] DO ?Entered By: [name] on 09/08/2021 14:38:46 ?-------------------------------------------------------------------------------- ?HPI Details ?Patient Name: Date of Service: ?Jocelyn ES, [name] 09/08/2021 1:15 PM ?Medical Record Number: [id number] ?Patient Account Number: [id number] ?Date of Birth/Sex: Treating RN: ?1956/06/02 (66 y.o. F) ?Primary Care Provider: [name] Other Clinician: ?Referring Provider: ?Treating Provider/Extender: [name] ?[name] ?Weeks in Treatment: 0 ?History of Present Illness ?Location: Midline sacral region ?Quality: Patient describes this as a soreness ?Severity: 2 out of 10 ?Duration: Greater than one year prior to presentation to the wound center ?Timing: Pain occurs most often with dressing changes ?Context: Developmental result of pressure over time to the sacral region ?Modifying Factors: Patient has previously had this wound packed with gauze, she has had a wound VAC, and she tells me other various wound care ?treatments over the past  year. ?ssociated Signs and Symptoms: Multiple sclerosis, hypertension, diabetes mellitus type 2, lymphedema ?A ?HPI Description: 03/27/16 patient presents today on initial evaluation concerning her sacral pressure injury which has been present for roughly one year. She ?tells me that she does not know of any x-rays or other advanced imaging has been performed in regard to the sacral region. She has been provided with wound ?care at the [facility] facility at this point in time. She has MS and is not able to move she tells me "at all". She does tell me however that the facility ?is very good about re-positioning her at least every 2 hours. With that being said she did fell the wound VAC apparently. She had this twice the first time she ?tells me that it did not seem to be working very well and the second time it actually made things worse this was just 2 months ago. Therefore this was ?discontinued. at this point in time today she is not having any signs or symptoms of systemic infection she is on a blood thinner which is Eliquis at this point ?in time. ?03/24/16; patient admitted to our clinic by Jeri Cos last week. She is a lady with advanced MS who is had a wound on her lower sacral area for most of the ?last year. She reiterates to me that wound vacs have not worked for her in fact she states they've actually made her wound worse on the last occasion. An x- ?ray was apparently done in the facility although I don't have these results. ?03/31/16; this patient had an x-ray done at the facility that did not show evidence of osteomyelitis. Her bone biopsy did not show osteomyelitis. 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 We are using collagen moist gauze border foam change every  second ?day ?05/01/16; substantial stage IV wound in a patient with advanced MS. She had been suggested to have collagen on the base of the wound covered with normal ?saline  wet to dry although her intake nurse reports there was no collagen on the wound surface. ?05/15/16 wound is not changed that much. This is a substantial stage IV wound with considerable undermining from 7 to 3:00. I have spoken to the wound care ?nurse at the [facility] facility, there was no records on what exactly caused the wound VAC to fail or records of the same. ?06/05/16; I received communication from [facility] skilled facility reporting that the patient refuses the wound back more often that it's on. When I ?confronted her with this today in the room she denied that she was responsible stating for the most part the problem is because she has trouble getting them to ?deal with her Foley catheter properly. I don't exactly follow her line of reasoning here however apparently they've been putting hydrogel wet to dry and in the ?facility ?07/03/16; I have not heard anything from [place]. The patient states she has not had a wound VAC the last 2-3 days due to "battery problems". She arrives ?today with wet to dry dressing. She has advanced MS and is a chronic care resident at the skilled facility ?07/31/16; patient is a resident of [facility] skilled facility. She has had the VAC applied but did not arrive with the [facility] in place. Her wound generally looks ?better perhaps somewhat less tunneling superiorly. There is still some exposed bone however. ?09/11/16; I follow this patient with advanced MS who is a resident of [facility] skilled facility on a monthly basis. She arrives 6 weeks after her last visit. The ?wound VAC apparently is no longer being used according to the patient because the nurses in the facility could not maintain a seal in this area. Via the patient it ?seems that they're using some form of wet to dry dressing although he received no documentation. The patient states she is eating well she is being turned in ?the facility. ?10/09/16; I follow this patient with advanced MS who is a resident of  [facility]. She apparently could not have a wound VAC is the facility could not maintain a ?seal therefore they're using wet-to-dry dressings however the patient arrives today with better measurements and a nicely granulated wound bed. ?11/05/16; patient with advanced MS was a pressure area on the lower sacrum/coccyx. She could not have a wound VAC because the facility could n/ot maintain ?a seal. I ordered Prisma last time. Wound dimensions are about the same per intake ?12/04/16; pressure area on the lower sacrum. using collagen. Has a new concern on the right great toe ?01/01/17; patient has now only a small open area on the lower sacrumcoccyx she is been doing well and this wound is gradually progressing towards closure. ?The area on the right great toe that looked to me like a paronychia last time is also quite a bit better although the nail here looks as though it's loosening ?READMISSION ?10/19/2020 ?This is a patient with advanced multiple sclerosis. We had her in clinic for a prolonged period in late 2017 to the summer 2018 with a stage IV wound on her ?lower sacrum/coccyx. I do not think we healed her out although her wound was a lot better. She was a resident at [facility] skilled facility at the time ?The patient states the wound reopened about 3 or 4 months ago. I am not completely certain  from talking to the patient if this is in the same position as last ?time or even indeed if it ever closed. This is a stage IV wound with exposed bone at 3:00 I think they are using silver alginate base dressing she has home ?health 1 time a week using Aquacel. She has a home health aide who comes twice a day. She had an air mattress but apparently it does not work ?As far as I can tell there is not been any major change in her medical problem list except she lives at home now. She is a diabetic on metformin ?6/23; patient has not been here in about 6-7 weeks. I am not sure what the issue is and the patient does not seem  to really know. She takes scat for ?transportation although that has not been a problem before. We changed her dressing last time she was here to silver collagen moistened with the backing wet- ?to-dry and

## 2021-09-08 NOTE — Progress Notes (Signed)
SEFERINA, BROKAW ([id number]) ?Visit Report for 09/08/2021 ?Allergy List Details ?Patient Name: Date of Service: ?[name], [name] 09/08/2021 1:15 PM ?Medical Record Number: [id number] ?Patient Account Number: [id number] ?Date of Birth/Sex: Treating RN: ?Jul 17, 1955 (66 y.o. F) Deaton, Bobbi ?Primary Care Utah Delauder: [name] Other Clinician: ?Referring Latoy Labriola: ?Treating Brandon Wiechman/Extender: [name] ?[name] ?Weeks in Treatment: 0 ?Allergies ?Active Allergies ?Sulfa (Sulfonamide Antibiotics) ?Reaction: shortness of breath ?Allergy Notes ?Electronic Signature(s) ?Signed: 09/08/2021 5:33:59 PM By: Deon Pilling RN, BSN ?Entered By: Deon Pilling on 09/08/2021 13:42:34 ?-------------------------------------------------------------------------------- ?Clinic Level of Care Assessment Details ?Patient Name: Date of Service: ?[name], [name] 09/08/2021 1:15 PM ?Medical Record Number: [id number] ?Patient Account Number: [id number] ?Date of Birth/Sex: Treating RN: ?Apr 08, 1956 (66 y.o. [name] ?Primary Care Enoc Getter: [name] Other Clinician: ?Referring Scot Shiraishi: ?Treating Emaad Nanna/Extender: [name] ?[name] ?Weeks in Treatment: 0 ?Clinic Level of Care Assessment Items ?TOOL 2 Quantity Score ?X- 1 0 ?Use when only an EandM is performed on the INITIAL visit ?ASSESSMENTS - Nursing Assessment / Reassessment ?X- 1 20 ?General Physical Exam (combine w/ comprehensive assessment (listed just below) when performed on new pt. evals) ?X- 1 25 ?Comprehensive Assessment (HX, ROS, Risk Assessments, Wounds Hx, etc.) ?ASSESSMENTS - Wound and Skin A ssessment / Reassessment ?X - Simple Wound Assessment / Reassessment - one wound 1 5 ?'[]'$  - 0 ?Complex Wound Assessment / Reassessment - multiple wounds ?'[]'$  - 0 ?Dermatologic / Skin Assessment (not related to wound area) ?ASSESSMENTS - Ostomy and/or Continence Assessment and Care ?'[]'$  - 0 ?Incontinence Assessment and Management ?'[]'$  - 0 ?Ostomy  Care Assessment and Management (repouching, etc.) ?PROCESS - Coordination of Care ?'[]'$  - 0 ?Simple Patient / Family Education for ongoing care ?X- 1 20 ?Complex (extensive) Patient / Family Education for ongoing care ?X- 1 10 ?Staff obtains Consents, Records, T Results / Process Orders ?est ?X- 1 10 ?Staff telephones HHA, Nursing Homes / Clarify orders / etc ?'[]'$  - 0 ?Routine Transfer to another Facility (non-emergent condition) ?'[]'$  - 0 ?Routine Hospital Admission (non-emergent condition) ?'[]'$  - 0 ?New Admissions / Biomedical engineer / Ordering NPWT Apligraf, etc. ?, ?'[]'$  - 0 ?Emergency Hospital Admission (emergent condition) ?'[]'$  - 0 ?Simple Discharge Coordination ?'[]'$  - 0 ?Complex (extensive) Discharge Coordination ?PROCESS - Special Needs ?'[]'$  - 0 ?Pediatric / Minor Patient Management ?'[]'$  - 0 ?Isolation Patient Management ?'[]'$  - 0 ?Hearing / Language / Visual special needs ?'[]'$  - 0 ?Assessment of Community assistance (transportation, D/C planning, etc.) ?'[]'$  - 0 ?Additional assistance / Altered mentation ?'[]'$  - 0 ?Support Surface(s) Assessment (bed, cushion, seat, etc.) ?INTERVENTIONS - Wound Cleansing / Measurement ?X- 1 5 ?Wound Imaging (photographs - any number of wounds) ?'[]'$  - 0 ?Wound Tracing (instead of photographs) ?X- 1 5 ?Simple Wound Measurement - one wound ?'[]'$  - 0 ?Complex Wound Measurement - multiple wounds ?X- 1 5 ?Simple Wound Cleansing - one wound ?'[]'$  - 0 ?Complex Wound Cleansing - multiple wounds ?INTERVENTIONS - Wound Dressings ?'[]'$  - 0 ?Small Wound Dressing one or multiple wounds ?X- 1 15 ?Medium Wound Dressing one or multiple wounds ?'[]'$  - 0 ?Large Wound Dressing one or multiple wounds ?'[]'$  - 0 ?Application of Medications - injection ?INTERVENTIONS - Miscellaneous ?'[]'$  - 0 ?External ear exam ?'[]'$  - 0 ?Specimen Collection (cultures, biopsies, blood, body fluids, etc.) ?'[]'$  - 0 ?Specimen(s) / Culture(s) sent or taken to Lab for analysis ?'[]'$  - 0 ?Patient Transfer (multiple staff / Civil Service fast streamer / Similar  devices) ?'[]'$  - 0 ?Simple Staple / Suture removal (25 or less) ?'[]'$  - 0 ?Complex Staple / Suture removal (26 or more) ?'[]'$  - 0 ?Hypo / Hyperglycemic Management (close monitor of Blood Glucose) ?'[]'$  - 0 ?Ankle / Brachial Index (ABI) - do not check if billed separately ?Has the patient been seen at the hospital within the last three years: Yes ?Total Score: 120 ?Level Of Care: New/Established - Level 4 ?Electronic Signature(s) ?Signed: 09/08/2021 4:43:06 PM By: Lorrin Jackson ?Entered By: Lorrin Jackson on 09/08/2021 14:41:50 ?-------------------------------------------------------------------------------- ?Encounter Discharge Information Details ?Patient Name: ?Date of Service: ?[name], [name] 09/08/2021 1:15 PM ?Medical Record Number: [id number] ?Patient Account Number: [id number] ?Date of Birth/Sex: ?Treating RN: ?1956/05/02 (66 y.o. [name] ?Primary Care Burnett Spray: [name] ?Other Clinician: ?Referring Alrick Cubbage: ?Treating Ottis Vacha/Extender: [name] ?[name] ?Weeks in Treatment: 0 ?Encounter Discharge Information Items ?Discharge Condition: Stable ?Ambulatory Status: Stretcher ?Discharge Destination: Home ?Transportation: Ambulance ?Schedule Follow-up Appointment: Yes ?Clinical Summary of Care: Provided on 09/08/2021 ?Form Type Recipient ?Paper Patient Patient ?Electronic Signature(s) ?Signed: 09/08/2021 2:42:59 PM By: Lorrin Jackson ?Entered By: Lorrin Jackson on 09/08/2021 14:42:58 ?-------------------------------------------------------------------------------- ?Lower Extremity Assessment Details ?Patient Name: ?Date of Service: ?[name], [name] 09/08/2021 1:15 PM ?Medical Record Number: [id number] ?Patient Account Number: [id number] ?Date of Birth/Sex: ?Treating RN: ?1956/02/24 (66 y.o. [name] ?Primary Care Mailynn Everly: [name] ?Other Clinician: ?Referring Johnathin Vanderschaaf: ?Treating Armie Moren/Extender: [name] ?[name] ?Weeks in Treatment: 0 ?Notes ?N/A:  Sacral Wound ?Electronic Signature(s) ?Signed: 09/08/2021 4:43:06 PM By: Lorrin Jackson ?Entered By: Lorrin Jackson on 09/08/2021 13:48:53 ?-------------------------------------------------------------------------------- ?Multi Wound Chart Details ?Patient Name: ?Date of Service: ?[name], [name] 09/08/2021 1:15 PM ?Medical Record Number: [id number] ?Patient Account Number: [id number] ?Date of Birth/Sex: ?Treating RN: ?December 08, 1955 (66 y.o. F) ?Primary Care Taisha Pennebaker: [name] ?Other Clinician: ?Referring Venezia Sargeant: ?Treating Charmian Forbis/Extender: [name] ?[name] ?Weeks in Treatment: 0 ?Vital Signs ?Height(in): ?Pulse(bpm): 76 ?Weight(lbs): ?Blood Pressure(mmHg): 97/66 ?Body Mass Index(BMI): ?Temperature(??F): 98 ?Respiratory Rate(breaths/min): 16 ?Photos: [N/A:N/A] ?Sacrum N/A N/A ?Wound Location: ?Pressure Injury N/A N/A ?Wounding Event: ?Pressure Ulcer N/A N/A ?Primary Etiology: ?Anemia, Congestive Heart Failure, N/A N/A ?Comorbid History: ?Deep Vein Thrombosis, Hypertension, ?Peripheral Venous Disease, Type II ?Diabetes ?07/20/2020 N/A N/A ?Date Acquired: ?0 N/A N/A ?Weeks of Treatment: ?Open N/A N/A ?Wound Status: ?No N/A N/A ?Wound Recurrence: ?2.2x3x0.7 N/A N/A ?Measurements L x W x D (cm) ?5.184 N/A N/A ?A (cm?) : ?rea ?3.629 N/A N/A ?Volume (cm?) : ?0.00% N/A N/A ?% Reduction in A rea: ?0.00% N/A N/A ?% Reduction in Volume: ?7 ?Starting Position 1 (o'clock): ?12 ?Ending Position 1 (o'clock): ?1.5 ?Maximum Distance 1 (cm): ?Yes N/A N/A ?Undermining: ?Category/Stage III N/A N/A ?Classification: ?Medium N/A N/A ?Exudate A mount: ?Serosanguineous N/A N/A ?Exudate Type: ?red, brown N/A N/A ?Exudate Color: ?Well defined, not attached N/A N/A ?Wound Margin: ?Large (67-100%) N/A N/A ?Granulation A mount: ?Red, Pink N/A N/A ?Granulation Quality: ?Small (1-33%) N/A N/A ?Necrotic A mount: ?Fat Layer (Subcutaneous Tissue): Yes N/A N/A ?Exposed Structures: ?Fascia: No ?Tendon: No ?Muscle: No ?Joint:  No ?Bone: No ?Treatment Notes ?Electronic Signature(s) ?Signed: 09/08/2021 2:45:16 PM By: [name] DO ?Entered By: [name] on 09/08/2021 14:37:54 ?--------------------------------------------------

## 2021-09-08 NOTE — Progress Notes (Signed)
Jocelyn Sanchez, Jocelyn Sanchez ([id number]) ?Visit Report for 09/08/2021 ?Abuse Risk Screen Details ?Patient Name: Date of Service: ?[name], [name] 09/08/2021 1:15 PM ?Medical Record Number: [id number] ?Patient Account Number: [id number] ?Date of Birth/Sex: Treating RN: ?[birth year] (66 y.o. Jocelyn Sanchez ?Primary Care Lidia Clavijo: [name] Other Clinician: ?Referring [name]: ?Treating [name]/Extender: [name] ?[name] ?Weeks in Treatment: 0 ?Abuse Risk Screen Items ?Answer ?ABUSE RISK SCREEN: ?Has anyone close to you tried to hurt or harm you recentlyo No ?Do you feel uncomfortable with anyone in your familyo No ?Has anyone forced you do things that you didnt want to doo No ?Electronic Signature(s) ?Signed: 09/08/2021 4:43:06 PM By: Lorrin Jackson ?Entered By: Lorrin Jackson on 09/08/2021 13:45:40 ?-------------------------------------------------------------------------------- ?Activities of Daily Living Details ?Patient Name: Date of Service: ?[name], [name] 09/08/2021 1:15 PM ?Medical Record Number: [id number] ?Patient Account Number: [id number] ?Date of Birth/Sex: Treating RN: ?1956/03/03 (66 y.o. Jocelyn Sanchez ?Primary Care Aliviana Burdell: [name] Other Clinician: ?Referring [name]: ?Treating [name]/Extender: [name] ?[name] ?Weeks in Treatment: 0 ?Activities of Daily Living Items ?Answer ?Activities of Daily Living (Please select one for each item) ?Drive Automobile Not Able ?T Medications ?ake Need Assistance ?Use T elephone Need Assistance ?Care for Appearance Need Assistance ?Use T oilet Need Assistance ?Bath / Shower Need Assistance ?Dress Self Need Assistance ?Feed Self Need Assistance ?Walk Not Able ?Get In / Out Bed Need Assistance ?Housework Not Able ?Prepare Meals Not Able ?Handle Money Need Assistance ?Shop for Self Not Able ?Electronic Signature(s) ?Signed: 09/08/2021 4:43:06 PM By: Lorrin Jackson ?Entered By: Lorrin Jackson on 09/08/2021  13:46:14 ?-------------------------------------------------------------------------------- ?Education Screening Details ?Patient Name: ?Date of Service: ?[name], [name] 09/08/2021 1:15 PM ?Medical Record Number: [id number] ?Patient Account Number: [id number] ?Date of Birth/Sex: ?Treating RN: ?12/19/1955 (66 y.o. Jocelyn Sanchez ?Primary Care Mariena Meares: [name] ?Other Clinician: ?Referring [name]: ?Treating [name]/Extender: [name] ?[name] ?Weeks in Treatment: 0 ?Primary Learner Assessed: Patient ?Learning Preferences/Education Level/Primary Language ?Learning Preference: Explanation, Demonstration, Printed Material ?Highest Education Level: College or Above ?Preferred Language: English ?Cognitive Barrier ?Language Barrier: No ?Translator Needed: No ?Memory Deficit: No ?Emotional Barrier: No ?Cultural/Religious Beliefs Affecting Medical Care: No ?Physical Barrier ?Impaired Vision: No ?Impaired Hearing: No ?Decreased Hand dexterity: Yes ?Limitations: MS ?Knowledge/Comprehension ?Knowledge Level: High ?Comprehension Level: High ?Ability to understand written instructions: High ?Ability to understand verbal instructions: High ?Motivation ?Anxiety Level: Calm ?Cooperation: Cooperative ?Education Importance: Acknowledges Need ?Interest in Health Problems: Asks Questions ?Perception: Coherent ?Willingness to Engage in Self-Management High ?Activities: ?Readiness to Engage in Self-Management High ?Activities: ?Electronic Signature(s) ?Signed: 09/08/2021 4:43:06 PM By: Lorrin Jackson ?Entered By: Lorrin Jackson on 09/08/2021 13:46:57 ?-------------------------------------------------------------------------------- ?Fall Risk Assessment Details ?Patient Name: ?Date of Service: ?[name], [name] 09/08/2021 1:15 PM ?Medical Record Number: [id number] ?Patient Account Number: [id number] ?Date of Birth/Sex: ?Treating RN: ?10-22-1955 (66 y.o. Jocelyn Sanchez ?Primary Care [name]: [name] ?Other Clinician: ?Referring [name]: ?Treating [name]/Extender: [name] ?[name] ?Weeks in Treatment: 0 ?Fall Risk Assessment Items ?Have you had 2 or more falls in the last 12 monthso 0 No ?Have you had any fall that resulted in injury in the last 12 monthso 0 No ?FALLS RISK SCREEN ?History of falling - immediate or within 3 months 0 No ?Secondary diagnosis (Do you have 2 or more medical diagnoseso) 15 Yes ?Ambulatory aid ?None/bed rest/wheelchair/nurse 0 Yes ?Crutches/cane/walker 0 No ?Furniture 0 No ?Intravenous therapy Access/Saline/Heparin [employer]  0 No ?Gait/Transferring ?Normal/ bed rest/ wheelchair 0 Yes ?Weak (short steps with or without shuffle, stooped but able to lift head while walking, may seek 0 No ?support from furniture) ?Impaired (short steps with shuffle, may have difficulty arising from chair, head down, impaired 0 No ?balance) ?Mental Status ?Oriented to own ability 0 Yes ?Electronic Signature(s) ?Signed: 09/08/2021 4:43:06 PM By: Lorrin Jackson ?Entered By: Lorrin Jackson on 09/08/2021 13:47:33 ?-------------------------------------------------------------------------------- ?Foot Assessment Details ?Patient Name: ?Date of Service: ?[name], [name] 09/08/2021 1:15 PM ?Medical Record Number: [id number] ?Patient Account Number: [id number] ?Date of Birth/Sex: ?Treating RN: ?1955/11/12 (66 y.o. Jocelyn Sanchez ?Primary Care Azizi Bally: [name] ?Other Clinician: ?Referring [name]: ?Treating [name]/Extender: [name] ?[name] ?Weeks in Treatment: 0 ?Foot Assessment Items ?Site Locations ?+ = Sensation present, - = Sensation absent, C = Callus, U = Ulcer ?R = Redness, W = Warmth, M = Maceration, PU = Pre-ulcerative lesion ?F = Fissure, S = Swelling, D = Dryness ?Assessment ?Right: Left: ?Other Deformity: No No ?Prior Foot Ulcer: No No ?Prior Amputation: No No ?Charcot Joint: No No ?Ambulatory Status: ?Gait: ?Notes ?N/A: Sacral Wound ?Electronic  Signature(s) ?Signed: 09/08/2021 4:43:06 PM By: Lorrin Jackson ?Entered By: Lorrin Jackson on 09/08/2021 13:48:34 ?-------------------------------------------------------------------------------- ?Nutrition Risk Screening Details ?Patient Name: ?Date of Service: ?[name], [name] 09/08/2021 1:15 PM ?Medical Record Number: [id number] ?Patient Account Number: [id number] ?Date of Birth/Sex: ?Treating RN: ?[birth year] (66 y.o. Jocelyn Sanchez ?Primary Care Hendrix Yurkovich: [name] ?Other Clinician: ?Referring Luken Shadowens: ?Treating Aleeha Boline/Extender: [name] ?[name] ?Weeks in Treatment: 0 ?Height (in): ?Weight (lbs): ?Body Mass Index (BMI): ?Nutrition Risk Screening Items ?Score Screening ?NUTRITION RISK SCREEN: ?I have an illness or condition that made me change the kind and/or amount of food I eat 0 No ?I eat fewer than two meals per day 0 No ?I eat few fruits and vegetables, or milk products 0 No ?I have three or more drinks of beer, liquor or wine almost every day 0 No ?I have tooth or mouth problems that make it hard for me to eat 0 No ?I don't always have enough money to buy the food I need 0 No ?I eat alone most of the time 0 No ?I take three or more different prescribed or over-the-counter drugs a day 1 Yes ?Without wanting to, I have lost or gained 10 pounds in the last six months 0 No ?I am not always physically able to shop, cook and/or feed myself 2 Yes ?Nutrition Protocols ?Good Risk Protocol ?Moderate Risk Protocol 0 Provide education on nutrition ?High Risk Proctocol ?Risk Level: Moderate Risk ?Score: 3 ?Electronic Signature(s) ?Signed: 09/08/2021 4:43:06 PM By: Lorrin Jackson ?Entered By: Lorrin Jackson on 09/08/2021 13:48:07 ?

## 2021-10-14 ENCOUNTER — Encounter (HOSPITAL_BASED_OUTPATIENT_CLINIC_OR_DEPARTMENT_OTHER): Payer: Medicare Other | Attending: Internal Medicine | Admitting: Internal Medicine

## 2021-10-14 DIAGNOSIS — E119 Type 2 diabetes mellitus without complications: Secondary | ICD-10-CM | POA: Diagnosis not present

## 2021-10-14 DIAGNOSIS — I890 Lymphedema, not elsewhere classified: Secondary | ICD-10-CM | POA: Insufficient documentation

## 2021-10-14 DIAGNOSIS — G35 Multiple sclerosis: Secondary | ICD-10-CM | POA: Diagnosis not present

## 2021-10-14 DIAGNOSIS — I10 Essential (primary) hypertension: Secondary | ICD-10-CM | POA: Diagnosis not present

## 2021-10-14 DIAGNOSIS — L89153 Pressure ulcer of sacral region, stage 3: Secondary | ICD-10-CM | POA: Insufficient documentation

## 2021-10-14 NOTE — Progress Notes (Signed)
Jocelyn Sanchez, Jocelyn Sanchez ([id number]) ?Visit Report for 10/14/2021 ?HPI Details ?Patient Name: Date of Service: ?[name], [name] 10/14/2021 10:15 A M ?Medical Record Number: [id number] ?Patient Account Number: [id number] ?Date of Birth/Sex: Treating RN: ?Oct 22, 1955 (66 y.o. Jocelyn Sanchez ?Primary Care Provider: [name] Other Clinician: ?Referring Provider: ?Treating Provider/Extender: Linton Ham ?[name] ?Weeks in Treatment: 5 ?History of Present Illness ?Location: Midline sacral region ?Quality: Patient describes this as a soreness ?Severity: 2 out of 10 ?Duration: Greater than one year prior to presentation to the wound center ?Timing: Pain occurs most often with dressing changes ?Context: Developmental result of pressure over time to the sacral region ?Modifying Factors: Patient has previously had this wound packed with gauze, she has had a wound VAC, and she tells me other various wound care ?treatments over the past year. ?ssociated Signs and Symptoms: Multiple sclerosis, hypertension, diabetes mellitus type 2, lymphedema ?A ?HPI Description: 03/27/16 patient presents today on initial evaluation concerning her sacral pressure injury which has been present for roughly one year. She ?tells me that she does not know of any x-rays or other advanced imaging has been performed in regard to the sacral region. She has been provided with wound ?care at the [facility] facility at this point in time. She has MS and is not able to move she tells me "at all". She does tell me however that the facility ?is very good about re-positioning her at least every 2 hours. With that being said she did fell the wound VAC apparently. She had this twice the first time she ?tells me that it did not seem to be working very well and the second time it actually made things worse this was just 2 months ago. Therefore this was ?discontinued. at this point in time today she is not having any signs or symptoms of  systemic infection she is on a blood thinner which is Eliquis at this point ?in time. ?03/24/16; patient admitted to our clinic by Jeri Cos last week. She is a lady with advanced MS who is had a wound on her lower sacral area for most of the ?last year. She reiterates to me that wound vacs have not worked for her in fact she states they've actually made her wound worse on the last occasion. An x- ?ray was apparently done in the facility although I don't have these results. ?03/31/16; this patient had an x-ray done at the facility that did not show evidence of osteomyelitis. Her bone biopsy did not show osteomyelitis. 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 She had been suggested to have collagen on the base of the wound covered with normal ?saline wet to dry although her intake nurse reports there was no collagen on the wound surface. ?05/15/16 wound is not changed that much. This is a substantial stage IV wound with considerable undermining from 7 to 3:00. I have spoken to the wound care ?nurse at the [facility] facility, there was no records on what exactly caused the wound VAC to fail or records of the same. ?06/05/16; I received communication from [facility] reporting that the patient refuses the wound back more often that it's on. When I ?confronted her with this today in the room she denied that she was responsible stating for the most part  the problem is because she has trouble getting them to ?deal with her Foley catheter properly. I don't exactly follow her line of reasoning here however apparently they've been putting hydrogel wet to dry and in the ?facility ?07/03/16; I have not heard anything from [facility]. The patient states she has not had a wound VAC the last 2-3 days due to "battery  problems". She arrives ?today with wet to dry dressing. She has advanced MS and is a chronic care resident at the skilled facility ?07/31/16; patient is a resident of [facility] skilled facility. She has had the VAC applied but did not arrive with the [facility] in place. Her wound generally looks ?better perhaps somewhat less tunneling superiorly. There is still some exposed bone however. ?09/11/16; I follow this patient with advanced MS who is a resident of [facility] skilled facility on a monthly basis. She arrives 6 weeks after her last visit. The ?wound VAC apparently is no longer being used according to the patient because the nurses in the facility could not maintain a seal in this area. Via the patient it ?seems that they're using some form of wet to dry dressing although he received no documentation. The patient states she is eating well she is being turned in ?the facility. ?10/09/16; I follow this patient with advanced MS who is a resident of [facility]. She apparently could not have a wound VAC is the facility could not maintain a ?seal therefore they're using wet-to-dry dressings however the patient arrives today with better measurements and a nicely granulated wound bed. ?11/05/16; patient with advanced MS was a pressure area on the lower sacrum/coccyx. She could not have a wound VAC because the facility could n/ot maintain ?a seal. I ordered Prisma last time. Wound dimensions are about the same per intake ?12/04/16; pressure area on the lower sacrum. using collagen. Has a new concern on the right great toe ?01/01/17; patient has now only a small open area on the lower sacrumcoccyx she is been doing well and this wound is gradually progressing towards closure. ?The area on the right great toe that looked to me like a paronychia last time is also quite a bit better although the nail here looks as though it's loosening ?READMISSION ?10/19/2020 ?This is a patient with advanced multiple sclerosis. We had her in clinic  for a prolonged period in late 2017 to the summer 2018 with a stage IV wound on her ?lower sacrum/coccyx. I do not think we healed her out although her wound was a lot better. She was a resident at [facility] at the time ?The patient states the wound reopened about 3 or 4 months ago. I am not completely certain from talking to the patient if this is in the same position as last ?time or even indeed if it ever closed. This is a stage IV wound with exposed bone at 3:00 I think they are using silver alginate base dressing she has home ?health 1 time a week using Aquacel. She has a home health aide who comes twice a day. She had an air mattress but apparently it does not work ?As far as I can tell there is not been any major change in her medical problem list except she lives at home now. She is a diabetic on metformin ?6/23; patient has not been here in about 6-7 weeks. I am not sure what the issue is and the patient does not seem to really know. She takes scat for ?transportation although that has  not been a problem before. We changed her dressing last time she was here to silver collagen moistened with the backing wet- ?to-dry and a foam border. Her wound is quite a bit better looking. ?7/14; 3-week follow-up. The patient's wound is not as good as last time. It is wider I think more undermining superiorly. Also there appears to be superficial skin ?breakdown superiorly and the skin around the wound and also from about 2-4 o'clock. Not a particularly viable surface. We have been using silver collagen and ?backing wet-to-dry. ?The patient lives in her own home. She apparently has care attendance for almost all of the day. She has a surface on her bed but this is clearly not a level 3. ?She also has home health. She claims to be eating well ?8/4; patient presents for 3-week follow-up. She has no issues or complaints today. She reports using wet-to-dry dressings for her sacral wound and collagen to ?the  buttocks wound. She states that her new mattress is arriving today ?9/29; its been almost 2 months since the patient was in the clinic. She has a sacral wound which is worse I think with exposed bone to the rig

## 2021-11-10 ENCOUNTER — Encounter (HOSPITAL_BASED_OUTPATIENT_CLINIC_OR_DEPARTMENT_OTHER): Payer: Medicare Other | Attending: Internal Medicine | Admitting: Internal Medicine

## 2021-11-10 DIAGNOSIS — Z87891 Personal history of nicotine dependence: Secondary | ICD-10-CM | POA: Diagnosis not present

## 2021-11-10 DIAGNOSIS — I509 Heart failure, unspecified: Secondary | ICD-10-CM | POA: Insufficient documentation

## 2021-11-10 DIAGNOSIS — I890 Lymphedema, not elsewhere classified: Secondary | ICD-10-CM | POA: Insufficient documentation

## 2021-11-10 DIAGNOSIS — G35 Multiple sclerosis: Secondary | ICD-10-CM | POA: Insufficient documentation

## 2021-11-10 DIAGNOSIS — E119 Type 2 diabetes mellitus without complications: Secondary | ICD-10-CM | POA: Diagnosis not present

## 2021-11-10 DIAGNOSIS — I110 Hypertensive heart disease with heart failure: Secondary | ICD-10-CM | POA: Diagnosis not present

## 2021-11-10 DIAGNOSIS — L89153 Pressure ulcer of sacral region, stage 3: Secondary | ICD-10-CM | POA: Insufficient documentation

## 2021-11-10 NOTE — Progress Notes (Signed)
CLEO, SANTUCCI ([id number]) Visit Report for 11/10/2021 Chief Complaint Document Details Patient Name: Date of Service: Jocelyn Sanchez [name] 11/10/2021 10:15 A M Medical Record Number: [id number] Patient Account Number: [id number] Date of Birth/Sex: Treating RN: [birth year] (66 y.o. Jocelyn Sanchez Primary Care Provider: [name] Other Clinician: Referring Provider: Treating Provider/Extender: Dimitri Ped Weeks in Treatment: 9 Information Obtained from: Patient Chief Complaint evaluation for sacral pressure ulcer 10/19/2020; patient is here for review of a sacral pressure ulcer which I think is in the same position as last time 3/23/2023patient presents for review of sacral pressure ulcer that has not healed in 6 years. She has been followed in our clinic for the past 6 years for this issue. She was last seen 05/2021 Electronic Signature(s) Signed: 11/10/2021 3:25:20 PM By: [name] DO Entered By: [name] on 11/10/2021 15:14:30 -------------------------------------------------------------------------------- HPI Details Patient Name: Date of Service: Jocelyn Sanchez, Lindie Spruce [name] 11/10/2021 10:15 A M Medical Record Number: [id number] Patient Account Number: [id number] Date of Birth/Sex: Treating RN: 1956/06/06 (66 y.o. Jocelyn Sanchez Primary Care Provider: [name] Other Clinician: Referring Provider: Treating Provider/Extender: Dimitri Ped Weeks in Treatment: 9 History of Present Illness Location: Midline sacral region Quality: Patient describes this as a soreness Severity: 2 out of 10 Duration: Greater than one year prior to presentation to the wound center Timing: Pain occurs most often with dressing changes Context: Developmental result of pressure over time to the sacral region Modifying Factors: Patient has previously had this wound packed with gauze, she has had a wound VAC, and she tells me other various  wound care treatments over the past year. ssociated Signs and Symptoms: Multiple sclerosis, hypertension, diabetes mellitus type 2, lymphedema A HPI Description: 03/27/16 patient presents today on initial evaluation concerning her sacral pressure injury which has been present for roughly one year. She tells me that she does not know of any x-rays or other advanced imaging has been performed in regard to the sacral region. She has been provided with wound care at the [facility] facility at this point in time. She has MS and is not able to move she tells me "at all". She does tell me however that the facility is very good about re-positioning her at least every 2 hours. With that being said she did fell the wound VAC apparently. She had this twice the first time she tells me that it did not seem to be working very well and the second time it actually made things worse this was just 2 months ago. Therefore this was discontinued. at this point in time today she is not having any signs or symptoms of systemic infection she is on a blood thinner which is Eliquis at this point in time. 03/24/16; patient admitted to our clinic by Jeri Cos last week. She is a lady with advanced MS who is had a wound on her lower sacral area for most of the last year. She reiterates to me that wound vacs have not worked for her in fact she states they've actually made her wound worse on the last occasion. An x- ray was apparently done in the facility although I don't have these results. 03/31/16; this patient had an x-ray done at the facility that did not show evidence of osteomyelitis. Her bone biopsy did not show osteomyelitis. 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 She had been suggested to have collagen on the base of  the wound covered with normal saline wet to dry although her intake nurse reports there was no collagen on the wound surface. 05/15/16 wound is not changed that much. This is a substantial stage IV wound with considerable undermining from 7 to 3:00. I have spoken to the wound care nurse at the [facility] facility, there was no records on what exactly caused the wound VAC to fail or records of the same. 06/05/16; I received communication from [facility] skilled facility reporting that the patient refuses the wound back more often that it's on. When I confronted her with this today in the room she denied that she was responsible stating for the most part the problem is because she has trouble getting them to deal with her Foley catheter properly. I don't exactly follow her line of reasoning here however apparently they've been putting hydrogel wet to dry and in the facility 07/03/16; I have not heard anything from [facility]. The patient states she has not had a wound VAC the last 2-3 days due to "battery problems". She arrives today with wet to dry dressing. She has advanced MS and is a chronic care resident at the skilled facility 07/31/16; patient is a resident of [facility] skilled facility. She has had the VAC applied but did not arrive with the [facility] in place. Her wound generally looks better perhaps somewhat less tunneling superiorly. There is still some exposed bone however. 09/11/16; I follow this patient with advanced MS who is a resident of [facility] skilled facility on a monthly basis. She arrives 6 weeks after her last visit. The wound VAC apparently is no longer being used according to the patient because the nurses in the facility could not maintain a seal in this area. Via the patient it seems that they're using some form of wet to dry dressing although he received no documentation. The patient states she is eating well she is being turned in the facility. 10/09/16; I follow this patient  with advanced MS who is a resident of [facility]. She apparently could not have a wound VAC is the facility could not maintain a seal therefore they're using wet-to-dry dressings however the patient arrives today with better measurements and a nicely granulated wound bed. 11/05/16; patient with advanced MS was a pressure area on the lower sacrum/coccyx. She could not have a wound VAC because the facility could n/ot maintain a seal. I ordered Prisma last time. Wound dimensions are about the same per intake 12/04/16; pressure area on the lower sacrum. using collagen. Has a new concern on the right great toe 01/01/17; patient has now only a small open area on the lower sacrumcoccyx she is been doing well and this wound is gradually progressing towards closure. The area on the right great toe that looked to me like a paronychia last time is also quite a bit better although the nail here looks as though it's loosening READMISSION 10/19/2020 This is a patient with advanced multiple sclerosis. We had her in clinic for a prolonged period in late 2017 to the summer 2018 with a stage IV wound on her lower sacrum/coccyx. I do not think we healed her out although her wound was a lot better. She was a resident at [facility] skilled facility at the time The patient states the wound reopened about 3 or 4 months  ago. I am not completely certain from talking to the patient if this is in the same position as last time or even indeed if it ever closed. 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 She has a home health aide who comes twice a day. She had an air mattress but apparently it does not work As far as I can tell there is not been any major change in her medical problem list except she lives at home now. She is a diabetic on metformin 6/23; patient has not been here in about 6-7 weeks. I am not sure what the  issue is and the patient does not seem to really know. She takes scat for transportation although that has not been a problem before. We changed her dressing last time she was here to silver collagen moistened with the backing wet- to-dry and a foam border. Her wound is quite a bit better looking. 7/14; 3-week follow-up. The patient's wound is not as good as last time. It is wider I think more undermining superiorly. Also there appears to be superficial skin breakdown superiorly and the skin around the wound and also from about 2-4 o'clock. Not a particularly viable surface. We have been using silver collagen and backing wet-to-dry. The patient lives in her own home. She apparently has care attendance for almost all of the day. She has a surface on her bed but this is clearly not a level 3. She also has home health. She claims to be eating well 8/4; patient presents for 3-week follow-up. She has no issues or complaints today. She reports using wet-to-dry dressings for her sacral wound and collagen to the buttocks wound. She states that her new mattress is arriving today 9/29; its been almost 2 months since the patient was in the clinic. She has a sacral wound which is worse I think with exposed bone to the right under the undermining area. Also concerning is a marked deterioration of her left buttock. With a superficial necrotic area and several small superficial areas. She has been using silver collagen wet-to-dry. The patient has a level 2 surface certainly would benefit from a level 3 if that can be provided. She is relatively immobile cannot turn her self secondary to multiple sclerosis. She tells me she has an aide for most of the day except for 4 hours. She also states she is not up much in a wheelchair. I wonder about her oral intake although she says she eats 3 meals a day and takes Glucerna 10/13; the patient looks somewhat better than 2 weeks ago today. I gave her empiric Augmentin which  she took a mobile x-ray was negative for osteomyelitis but they suggested a bone scan. The wound area actually looks somewhat better today 10/27; comes in today with exposed bone complaints of more pain and odor. She looks as though she is losing weight although she claims to be eating well. She takes Ensure 11/16; patient came in 2 weeks ago with exposed bone. I did a biopsy this was negative for osteomyelitis. Bone culture was also negative showing multiple organisms. I believe I did give her Augmentin which gave her some diarrhea but she does not need further antibiotic. We are using silver alginate to this wound and also the closely position superficial area on the skin just above the wound. She managed to get a level  3 surface that we had ordered she says she does not like it because it is hot and noisy. But nevertheless she is using a 12/14; 1 month follow-up. The patient has 2 wounds her original deep wound on the sacrum which initially had exposed bone. This was cultured and surprisingly negative. She also superiorly had a more superficial but not a healthy looking wound above it. We are using silver alginate on both wounds. Things look improved today. She has completed her antibiotics. Given the negative culture and biopsy of the bone here I do not think any more systemic antibiotics are indicated 09/08/2021 Jocelyn Sanchez Is a 66 year old female with a past medical history of multiple sclerosis that presents to the clinic for continued follow-up of her sacral ulcer. She has no issues or complaints today. She states she was recommended to continue following up in our clinic by her home health nurse specifically to have orders for barrier cream. Patient also complains about her current bed and this is a sand mattress. She would like an air mattress. She has no complaints about the wound today. She denies systemic signs of infection. 4/28; 5-week follow-up. Wound is measuring smaller she  has been using silver alginate she has home health coming once a week and her son is changing it the rest of the days. She did not have the x-ray that was ordered last time apparently this was to be done through a mobile X type company 5/25; patient presents for follow-up. She was switched to collagen at last clinic visit. She has no issues or complaints today. She was not able to obtain her x- ray. She would like another order for this as she lost the previous one. Electronic Signature(s) Signed: 11/10/2021 3:25:20 PM By: [name] DO Entered By: [name] on 11/10/2021 15:15:55 -------------------------------------------------------------------------------- Physical Exam Details Patient Name: Date of Service: Jocelyn Sanchez [name] 11/10/2021 10:15 A M Medical Record Number: [id number] Patient Account Number: [id number] Date of Birth/Sex: Treating RN: 04-Oct-1955 (66 y.o. Jocelyn Sanchez Primary Care Provider: [name] Other Clinician: Referring Provider: Treating Provider/Extender: Dimitri Ped Weeks in Treatment: 9 Constitutional respirations regular, non-labored and within target range for patient.Marland Kitchen Psychiatric pleasant and cooperative. Notes Sacrum: Open wound with pale granulation tissue and undermining. No signs of surrounding infection. Electronic Signature(s) Signed: 11/10/2021 3:25:20 PM By: [name] DO Entered By: [name] on 11/10/2021 15:17:36 -------------------------------------------------------------------------------- Physician Orders Details Patient Name: Date of Service: Jocelyn Sanchez [name] 11/10/2021 10:15 A M Medical Record Number: [id number] Patient Account Number: [id number] Date of Birth/Sex: Treating RN: [birth year] (66 y.o. Jocelyn Sanchez Primary Care Provider: [name] Other Clinician: Referring Provider: Treating Provider/Extender: Dimitri Ped Weeks in Treatment:  9 Verbal / Phone Orders: No Diagnosis Coding ICD-10 Coding Code Description L89.153 Pressure ulcer of sacral region, stage 3 G35 Multiple sclerosis Follow-up Appointments Return appointment in 1 month. - with Dr. Heber  Leveda Anna, Room 7) **Stretcher** 56/22/23 @ 10:15 Other: - Order for Xray given to patient. May get Xray at [facility] or hospital. Off-Loading A fluidized (Group 3) mattress ir Turn and reposition every 2 hours Additional Orders / Instructions Follow [facility] wound care orders this week; continue Home Health for wound care. May utilize formulary equivalent dressing for wound treatment orders unless otherwise specified. - Continue Prisma until culture results and possible Keystone Antibiotic Compound. When start Keystone Antibiotic Compound discontinue Prisma. Other Home Health Orders/Instructions: -  [facility] HH: 2-3x per week Wound Treatment Wound #6 - Sacrum Cleanser: Soap and Water ([facility]) 1 x Per Day/30 Days Discharge Instructions: May shower and wash wound with dial antibacterial soap and water prior to dressing change. Cleanser: Wound Cleanser (Home Health) 1 x Per Day/30 Days Discharge Instructions: Cleanse the wound with wound cleanser prior to applying a clean dressing using gauze sponges, not tissue or cotton balls. Peri-Wound Care: Barrier Cream of Choice (Home Health) 1 x Per Day/30 Days Topical: Keystone Antibiotic Compound 1 x Per Day/30 Days Discharge Instructions: Start when obtained Prim Dressing: Promogran Prisma Matrix, 4.34 (sq in) (silver collagen) (Home Health) 1 x Per Day/30 Days ary Discharge Instructions: Moisten collagen with hydrogel Secondary Dressing: Bordered Gauze, 4x4 in (Home Health) 1 x Per Day/30 Days Discharge Instructions: Saline moist gauze in wound over Prisma Secondary Dressing: Zetuvit Plus Silicone Border Dressing 5x5 (in/in) ([place]) 1 x Per Day/30 Days Discharge Instructions: Or  equivalent foam border (pt prefers square dressing) Laboratory erobe culture (MICRO) - PCR sent to [facility] - (ICD10 L89.153 - Pressure ulcer of sacral region, Bacteria identified in Unspecified specimen by A stage 3) LOINC Code: 740-8 Convenience Name: Aerobic culture-specimen not specified Radiology X-ray, coccyx/sacrum - Xray to r/o osteomyelitis - (ICD10 L89.153 - Pressure ulcer of sacral region, stage 3) Electronic Signature(s) Signed: 11/10/2021 3:25:20 PM By: [name] DO Entered By: [name] on 11/10/2021 15:19:49 Prescription 11/10/2021 -------------------------------------------------------------------------------- Basilia Jumbo D. [name] DO Patient Name: Provider: December 26, 1955 [id number] Date of Birth: NPI#: [id number] Sex: DEA #: [phone] Phone #: License #: [place] Patient Address: [address], [place] [address] [place], [place] [address] [phone] Allergies Sulfa (Sulfonamide Antibiotics) Provider's Orders X-ray, coccyx/sacrum - ICD10: L89.153 - Xray to r/o osteomyelitis Hand Signature: Date(s): Electronic Signature(s) Signed: 11/10/2021 3:25:20 PM By: [name] DO Entered By: [name] on 11/10/2021 15:19:50 -------------------------------------------------------------------------------- Problem List Details Patient Name: Date of Service: Jocelyn Sanchez [name] 11/10/2021 10:15 A M Medical Record Number: [id number] Patient Account Number: [id number] Date of Birth/Sex: Treating RN: 04-Jul-1955 (66 y.o. Jocelyn Sanchez Primary Care Provider: [name] Other Clinician: Referring Provider: Treating Provider/Extender: Dimitri Ped Weeks in Treatment: 9 Active Problems ICD-10 Encounter Code Description Active Date MDM Diagnosis L89.153 Pressure ulcer of sacral region, stage 3 09/08/2021 No Yes G35 Multiple  sclerosis 09/08/2021 No Yes Inactive Problems Resolved Problems Electronic Signature(s) Signed: 11/10/2021 3:25:20 PM By: [name] DO Entered By: [name] on 11/10/2021 15:14:05 -------------------------------------------------------------------------------- Progress Note Details Patient Name: Date of Service: Jocelyn Sanchez [name] 11/10/2021 10:15 A M Medical Record Number: [id number] Patient Account Number: [id number] Date of Birth/Sex: Treating RN: 03-14-1956 (66 y.o. Jocelyn Sanchez Primary Care Provider: [name] Other Clinician: Referring Provider: Treating Provider/Extender: Dimitri Ped Weeks in Treatment: 9 Subjective Chief Complaint Information obtained from Patient evaluation for sacral pressure ulcer 10/19/2020; patient is here for review of a sacral pressure ulcer which I think is in the same position as last time 09/08/2021 oopatient presents for review of sacral pressure ulcer that has not healed in 6 years. She has been followed in our clinic for the past 6 years for this issue. She was last seen 05/2021 History of Present Illness (HPI) The following HPI elements were documented for the patient's wound: Location: Midline sacral region Quality: Patient describes this as a soreness Severity: 2  out of 10 Duration: Greater than one year prior to presentation to the wound center Timing: Pain occurs most often with dressing changes Context: Developmental result of pressure over time to the sacral region Modifying Factors: Patient has previously had this wound packed with gauze, she has had a wound VAC, and she tells me other various wound care treatments over the past year. Associated Signs and Symptoms: Multiple sclerosis, hypertension, diabetes mellitus type 2, lymphedema 03/27/16 patient presents today on initial evaluation concerning her sacral pressure injury which has been present for roughly one year. She tells me that she does  not know of any x-rays or other advanced imaging has been performed in regard to the sacral region. She has been provided with wound care at the [facility] facility at this point in time. She has MS and is not able to move she tells me "at all". She does tell me however that the facility is very good about re-positioning her at least every 2 hours. With that being said she did fell the wound VAC apparently. She had this twice the first time she tells me that it did not seem to be working very well and the second time it actually made things worse this was just 2 months ago. Therefore this was discontinued. at this point in time today she is not having any signs or symptoms of systemic infection she is on a blood thinner which is Eliquis at this point in time. 03/24/16; patient admitted to our clinic by Jeri Cos last week. She is a lady with advanced MS who is had a wound on her lower sacral area for most of the last year. She reiterates to me that wound vacs have not worked for her in fact she states they've actually made her wound worse on the last occasion. An x- ray was apparently done in the facility although I don't have these results. 03/31/16; this patient had an x-ray done at the facility that did not show evidence of osteomyelitis. Her bone biopsy did not show osteomyelitis. 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 She had been suggested to have collagen on the base of the wound covered with normal saline wet to dry although her intake nurse reports there was no collagen on the wound surface. 05/15/16 wound is not changed that much. This is a substantial stage IV wound with considerable undermining from 7 to 3:00. I have spoken to the wound care nurse at the [facility] facility, there was no  records on what exactly caused the wound VAC to fail or records of the same. 06/05/16; I received communication from [facility] skilled facility reporting that the patient refuses the wound back more often that it's on. When I confronted her with this today in the room she denied that she was responsible stating for the most part the problem is because she has trouble getting them to deal with her Foley catheter properly. I don't exactly follow her line of reasoning here however apparently they've been putting hydrogel wet to dry and in the facility 07/03/16; I have not heard anything from [facility]. The patient states she has not had a wound VAC the last 2-3 days due to "battery problems". She arrives today with wet to dry dressing. She has advanced MS and is a chronic care resident at the  skilled facility 07/31/16; patient is a resident of [facility] skilled facility. She has had the VAC applied but did not arrive with the [facility] in place. Her wound generally looks better perhaps somewhat less tunneling superiorly. There is still some exposed bone however. 09/11/16; I follow this patient with advanced MS who is a resident of [facility] skilled facility on a monthly basis. She arrives 6 weeks after her last visit. The wound VAC apparently is no longer being used according to the patient because the nurses in the facility could not maintain a seal in this area. Via the patient it seems that they're using some form of wet to dry dressing although he received no documentation. The patient states she is eating well she is being turned in the facility. 10/09/16; I follow this patient with advanced MS who is a resident of [facility]. She apparently could not have a wound VAC is the facility could not maintain a seal therefore they're using wet-to-dry dressings however the patient arrives today with better measurements and a nicely granulated wound bed. 11/05/16; patient with advanced MS was a pressure area on the  lower sacrum/coccyx. She could not have a wound VAC because the facility could n/ot maintain a seal. I ordered Prisma last time. Wound dimensions are about the same per intake 12/04/16; pressure area on the lower sacrum. using collagen. Has a new concern on the right great toe 01/01/17; patient has now only a small open area on the lower sacrumoococcyx she is been doing well and this wound is gradually progressing towards closure. The area on the right great toe that looked to me like a paronychia last time is also quite a bit better although the nail here looks as though it's loosening READMISSION 10/19/2020 This is a patient with advanced multiple sclerosis. We had her in clinic for a prolonged period in late 2017 to the summer 2018 with a stage IV wound on her lower sacrum/coccyx. I do not think we healed her out although her wound was a lot better. She was a resident at [facility] skilled facility at the time The patient states the wound reopened about 3 or 4 months ago. I am not completely certain from talking to the patient if this is in the same position as last time or even indeed if it ever closed. 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 She has a home health aide who comes twice a day. She had an air mattress but apparently it does not work As far as I can tell there is not been any major change in her medical problem list except she lives at home now. She is a diabetic on metformin 6/23; patient has not been here in about 6-7 weeks. I am not sure what the issue is and the patient does not seem to really know. She takes scat for transportation although that has not been a problem before. We changed her dressing last time she was here to silver collagen moistened with the backing wet- to-dry and a foam border. Her wound is quite a bit better looking. 7/14; 3-week follow-up. The  patient's wound is not as good as last time. It is wider I think more undermining superiorly. Also there appears to be superficial skin breakdown superiorly and the skin around the wound and also from about 2-4 o'clock. Not a particularly viable surface. We have  been using silver collagen and backing wet-to-dry. The patient lives in her own home. She apparently has care attendance for almost all of the day. She has a surface on her bed but this is clearly not a level 3. She also has home health. She claims to be eating well 8/4; patient presents for 3-week follow-up. She has no issues or complaints today. She reports using wet-to-dry dressings for her sacral wound and collagen to the buttocks wound. She states that her new mattress is arriving today 9/29; its been almost 2 months since the patient was in the clinic. She has a sacral wound which is worse I think with exposed bone to the right under the undermining area. Also concerning is a marked deterioration of her left buttock. With a superficial necrotic area and several small superficial areas. She has been using silver collagen wet-to-dry. The patient has a level 2 surface certainly would benefit from a level 3 if that can be provided. She is relatively immobile cannot turn her self secondary to multiple sclerosis. She tells me she has an aide for most of the day except for 4 hours. She also states she is not up much in a wheelchair. I wonder about her oral intake although she says she eats 3 meals a day and takes Glucerna 10/13; the patient looks somewhat better than 2 weeks ago today. I gave her empiric Augmentin which she took a mobile x-ray was negative for osteomyelitis but they suggested a bone scan. The wound area actually looks somewhat better today 10/27; comes in today with exposed bone complaints of more pain and odor. She looks as though she is losing weight although she claims to be eating well. She takes Ensure 11/16; patient  came in 2 weeks ago with exposed bone. I did a biopsy this was negative for osteomyelitis. Bone culture was also negative showing multiple organisms. I believe I did give her Augmentin which gave her some diarrhea but she does not need further antibiotic. We are using silver alginate to this wound and also the closely position superficial area on the skin just above the wound. She managed to get a level 3 surface that we had ordered she says she does not like it because it is hot and noisy. But nevertheless she is using a 12/14; 1 month follow-up. The patient has 2 wounds her original deep wound on the sacrum which initially had exposed bone. This was cultured and surprisingly negative. She also superiorly had a more superficial but not a healthy looking wound above it. We are using silver alginate on both wounds. Things look improved today. She has completed her antibiotics. Given the negative culture and biopsy of the bone here I do not think any more systemic antibiotics are indicated 09/08/2021 Jocelyn Sanchez Is a 66 year old female with a past medical history of multiple sclerosis that presents to the clinic for continued follow-up of her sacral ulcer. She has no issues or complaints today. She states she was recommended to continue following up in our clinic by her home health nurse specifically to have orders for barrier cream. Patient also complains about her current bed and this is a sand mattress. She would like an air mattress. She has no complaints about the wound today. She denies systemic signs of infection. 4/28; 5-week follow-up. Wound is measuring smaller she has been using silver alginate she has home health coming once a week and her son is changing it the rest of the days. She did not  have the x-ray that was ordered last time apparently this was to be done through a mobile [employer] type company 5/25; patient presents for follow-up. She was switched to collagen at last clinic visit.  She has no issues or complaints today. She was not able to obtain her x- ray. She would like another order for this as she lost the previous one. Patient History Information obtained from Patient. Family History No family history of Cancer, Diabetes, Heart Disease, Hypertension, Kidney Disease, Lung Disease, Seizures, Stroke, Thyroid Problems, Tuberculosis. Social History Former smoker, Marital Status - Divorced, Alcohol Use - Never, Drug Use - No History, Caffeine Use - Rarely. 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 Vitals Time Taken: 10:54 AM, Temperature: 98.1 F, Pulse: 83 bpm, Respiratory Rate: 18 breaths/min, Blood Pressure: 88/53 mmHg. Psychiatric pleasant and cooperative. General Notes: Sacrum: Open wound with pale granulation tissue and undermining. No signs of surrounding infection. Integumentary (Hair, Skin) Wound #6 status is Open. Original cause of wound was Pressure Injury. The date acquired was: 07/20/2020. The wound has been in treatment 9 weeks. The wound is located on the Sacrum. The wound measures 2.5cm length x 1.5cm width x 0.6cm depth; 2.945cm^2 area and 1.767cm^3 volume. There is Fat Layer (Subcutaneous Tissue) exposed. There is undermining starting at 7:00 and ending at  11:00 with a maximum distance of 1.5cm. There is a medium amount of serosanguineous drainage noted. The wound margin is well defined and not attached to the wound base. There is large (67-100%) red, pink granulation within the wound bed. There is no necrotic tissue within the wound bed. Assessment Active Problems ICD-10 Pressure ulcer of sacral region, stage 3 Multiple sclerosis This is a palliative wound care case. Currently her wound is stable. I recommended continuing collagen And aggressive offloading. We gave her another order to obtain an x-ray. She may benefit from [facility] antibiotic and I obtained a wound culture for this purpose today. Follow-up in 1 month. Plan Follow-up Appointments: Return appointment in 1 month. - with Dr. Heber Petal Leveda Anna, Room 7) **Stretcher** 56/22/23 @ 10:15 Other: - Order for Xray given to patient. May get Xray at [facility] or hospital. Off-Loading: Air fluidized (Group 3) mattress Turn and reposition every 2 hours Additional Orders / Instructions: Follow Nutritious Diet Home Health: New wound care orders this week; continue Home Health for wound care. May utilize formulary equivalent dressing for wound treatment orders unless otherwise specified. - Continue Prisma until culture results and possible Keystone Antibiotic Compound. When start Keystone Antibiotic Compound discontinue Prisma. Other Home Health Orders/Instructions: - Med[facility] HH: 2-3x per week Laboratory ordered were: Aerobic culture-specimen not specified - PCR sent to [facility] ordered were: X-ray, coccyx/sacrum - Xray to r/o osteomyelitis WOUND #6: - Sacrum Wound Laterality: Cleanser: Soap and Water ([place]) 1 x Per Day/30 Days Discharge Instructions: May shower and wash wound with dial antibacterial soap and water prior to dressing change. Cleanser: Wound Cleanser (Home Health) 1 x Per Day/30 Days Discharge Instructions: Cleanse  the wound with wound cleanser prior to  applying a clean dressing using gauze sponges, not tissue or cotton balls. Peri-Wound Care: Barrier Cream of Choice (Home Health) 1 x Per Day/30 Days Topical: Keystone Antibiotic Compound 1 x Per Day/30 Days Discharge Instructions: Start when obtained Prim Dressing: Promogran Prisma Matrix, 4.34 (sq in) (silver collagen) (Home Health) 1 x Per Day/30 Days ary Discharge Instructions: Moisten collagen with hydrogel Secondary Dressing: Bordered Gauze, 4x4 in (Home Health) 1 x Per Day/30 Days Discharge Instructions: Saline moist gauze in wound over Prisma Secondary Dressing: Zetuvit Plus Silicone Border Dressing 5x5 (in/in) (Home Health) 1 x Per Day/30 Days Discharge Instructions: Or equivalent foam border (pt prefers square dressing) 1. Wound culture 2. sacral x-ray 3. Collagen with saline wet-to-dry backing 4. Follow-up in 1 month Electronic Signature(s) Signed: 11/10/2021 3:25:20 PM By: [name] DO Entered By: [name] on 11/10/2021 15:24:48 -------------------------------------------------------------------------------- HxROS Details Patient Name: Date of Service: Jocelyn Sanchez [name] 11/10/2021 10:15 A M Medical Record Number: [id number] Patient Account Number: [id number] Date of Birth/Sex: Treating RN: 1956-02-01 (66 y.o. Jocelyn Sanchez Primary Care Provider: [name] Other Clinician: Referring Provider: Treating Provider/Extender: Dimitri Ped Weeks in Treatment: 9 Information Obtained From Patient Hematologic/Lymphatic Medical History: Positive for: Anemia Negative for: Lymphedema Respiratory Medical History: Past Medical History Notes: h/o bilat pulmonary embolism Cardiovascular Medical History: Positive for: Congestive Heart Failure; Deep Vein Thrombosis - (R) leg; Hypertension; Peripheral Venous Disease Gastrointestinal Medical History: Past Medical History Notes: Esophageal Stricture Endocrine Medical History: Positive  for: Type II Diabetes Time with diabetes: 8 years Treated with: Oral agents Blood sugar tested every day: No Integumentary (Skin) Medical History: Negative for: History of Burn Musculoskeletal Medical History: Past Medical History Notes: MS , 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) Neurologic Medical History: Past Medical History Notes: Multiple Sclerosis Psychiatric Medical History: Negative for: Anorexia/bulimia; Confinement Anxiety Past Medical History Notes: anxiety Immunizations Pneumococcal Vaccine: Received Pneumococcal Vaccination: Yes Received Pneumococcal Vaccination On or After 60th Birthday: No Immunization Notes: last tetanus about 3 years ago Implantable Devices None Family and Social History Cancer: No; Diabetes: No; Heart Disease: No; Hypertension: No; Kidney Disease: No; Lung Disease: No; Seizures: No; Stroke: No; Thyroid Problems: No; Tuberculosis: No; Former smoker; Marital Status - Divorced; Alcohol Use: Never; Drug Use: No History; Caffeine Use: Rarely; Financial Concerns: No; Food, Clothing or Shelter Needs: No; Support System Lacking: No; Transportation Concerns: No Electronic Signature(s) Signed: 11/10/2021 3:25:20 PM By: [name] DO Signed: 11/10/2021 4:54:47 PM By: Lorrin Jackson Entered By: [name] on 11/10/2021 15:16:00 -------------------------------------------------------------------------------- SuperBill Details Patient Name: Date of Service: Jocelyn Sanchez [name] 11/10/2021 Medical Record Number: [id number] Patient Account Number: [id number] Date of Birth/Sex: Treating RN: [birth year] (66 y.o. Jocelyn Sanchez Primary Care Provider: [name] Other Clinician: Referring Provider: Treating Provider/Extender: Dimitri Ped Weeks in Treatment: 9 Diagnosis Coding ICD-10 Codes Code Description L89.153 Pressure ulcer of sacral region, stage 3 G35 Multiple sclerosis Facility Procedures CPT4  Code: [id number] Description: [facility] VISIT-LEV 3 EST PT Modifier: Quantity: 1 Physician Procedures : CPT4 Code Description Modifier [id number] - WC PHYS LEVEL 3 - EST PT ICD-10 Diagnosis Description L89.153 Pressure ulcer of sacral region, stage 3 G35 Multiple sclerosis Quantity: 1 Electronic Signature(s) Signed: 11/10/2021 3:25:20 PM By: [name] DO Entered By: [name] on 11/10/2021 15:25:00

## 2021-11-10 NOTE — Progress Notes (Signed)
Jocelyn Sanchez ([id number]) Visit Report for 11/10/2021 Arrival Information Details Patient Name: Date of Service: Jocelyn Sanchez [name] 11/10/2021 10:15 A M Medical Record Number: [id number] Patient Account Number: [id number] Date of Birth/Sex: Treating RN: February 03, 1956 (66 y.o. [name] Primary Care Analee Montee: [name] Other Clinician: Referring Navaya Wiatrek: Treating Chasty Randal/Extender: Dimitri Ped Weeks in Treatment: 9 Visit Information History Since Last Visit Added or deleted any medications: No Patient Arrived: Stretcher Any new allergies or adverse reactions: No Arrival Time: 10:53 Had a fall or experienced change in No Transfer Assistance: None activities of daily living that may affect Patient Identification Verified: Yes risk of falls: Secondary Verification Process Completed: Yes Signs or symptoms of abuse/neglect since last visito No Patient Requires Transmission-Based Precautions: No Hospitalized since last visit: No Patient Has Alerts: No Implantable device outside of the clinic excluding No cellular tissue based products placed in the center since last visit: Has Dressing in Place as Prescribed: Yes Pain Present Now: No Electronic Signature(s) Signed: 11/10/2021 4:54:47 PM By: Lorrin Jackson Entered By: Lorrin Jackson on 11/10/2021 10:54:49 -------------------------------------------------------------------------------- Clinic Level of Care Assessment Details Patient Name: Date of Service: Jocelyn Sanchez [name] 11/10/2021 10:15 A M Medical Record Number: [id number] Patient Account Number: [id number] Date of Birth/Sex: Treating RN: 1955/10/07 (66 y.o. [name] Primary Care Chick Cousins: [name] Other Clinician: Referring Dhanush Jokerst: Treating Hai Grabe/Extender: Dimitri Ped Weeks in Treatment: 9 Clinic Level of Care Assessment Items TOOL 4 Quantity Score X- 1 0 Use when only an EandM is performed on  FOLLOW-UP visit ASSESSMENTS - Nursing Assessment / Reassessment X- 1 10 Reassessment of Co-morbidities (includes updates in patient status) X- 1 5 Reassessment of Adherence to Treatment Plan ASSESSMENTS - Wound and Skin A ssessment / Reassessment X - Simple Wound Assessment / Reassessment - one wound 1 5 '[]'  - 0 Complex Wound Assessment / Reassessment - multiple wounds '[]'  - 0 Dermatologic / Skin Assessment (not related to wound area) ASSESSMENTS - Focused Assessment '[]'  - 0 Circumferential Edema Measurements - multi extremities '[]'  - 0 Nutritional Assessment / Counseling / Intervention '[]'  - 0 Lower Extremity Assessment (monofilament, tuning fork, pulses) '[]'  - 0 Peripheral Arterial Disease Assessment (using hand held doppler) ASSESSMENTS - Ostomy and/or Continence Assessment and Care '[]'  - 0 Incontinence Assessment and Management '[]'  - 0 Ostomy Care Assessment and Management (repouching, etc.) PROCESS - Coordination of Care '[]'  - 0 Simple Patient / Family Education for ongoing care X- 1 20 Complex (extensive) Patient / Family Education for ongoing care X- 1 10 Staff obtains Programmer, systems, Records, T Results / Process Orders est X- 1 10 Staff telephones HHA, Nursing Homes / Clarify orders / etc '[]'  - 0 Routine Transfer to another Facility (non-emergent condition) '[]'  - 0 Routine Hospital Admission (non-emergent condition) '[]'  - 0 New Admissions / Biomedical engineer / Ordering NPWT Apligraf, etc. , '[]'  - 0 Emergency Hospital Admission (emergent condition) '[]'  - 0 Simple Discharge Coordination '[]'  - 0 Complex (extensive) Discharge Coordination PROCESS - Special Needs '[]'  - 0 Pediatric / Minor Patient Management '[]'  - 0 Isolation Patient Management '[]'  - 0 Hearing / Language / Visual special needs '[]'  - 0 Assessment of Community assistance (transportation, D/C planning, etc.) '[]'  - 0 Additional assistance / Altered mentation '[]'  - 0 Support Surface(s) Assessment (bed, cushion,  seat, etc.) INTERVENTIONS - Wound Cleansing / Measurement X - Simple Wound Cleansing - one wound 1 5 '[]'  - 0 Complex Wound  Cleansing - multiple wounds X- 1 5 Wound Imaging (photographs - any number of wounds) '[]'  - 0 Wound Tracing (instead of photographs) X- 1 5 Simple Wound Measurement - one wound '[]'  - 0 Complex Wound Measurement - multiple wounds INTERVENTIONS - Wound Dressings '[]'  - 0 Small Wound Dressing one or multiple wounds X- 1 15 Medium Wound Dressing one or multiple wounds '[]'  - 0 Large Wound Dressing one or multiple wounds '[]'  - 0 Application of Medications - topical '[]'  - 0 Application of Medications - injection INTERVENTIONS - Miscellaneous '[]'  - 0 External ear exam X- 1 5 Specimen Collection (cultures, biopsies, blood, body fluids, etc.) X- 1 5 Specimen(s) / Culture(s) sent or taken to Lab for analysis '[]'  - 0 Patient Transfer (multiple staff / Civil Service fast streamer / Similar devices) '[]'  - 0 Simple Staple / Suture removal (25 or less) '[]'  - 0 Complex Staple / Suture removal (26 or more) '[]'  - 0 Hypo / Hyperglycemic Management (close monitor of Blood Glucose) '[]'  - 0 Ankle / Brachial Index (ABI) - do not check if billed separately X- 1 5 Vital Signs Has the patient been seen at the hospital within the last three years: Yes Total Score: 105 Level Of Care: New/Established - Level 3 Electronic Signature(s) Signed: 11/10/2021 4:54:47 PM By: Lorrin Jackson Entered By: Lorrin Jackson on 11/10/2021 11:32:59 -------------------------------------------------------------------------------- Complex / Palliative Patient Assessment Details Patient Name: Date of Service: Jocelyn Sanchez [name] 11/10/2021 10:15 A M Medical Record Number: [id number] Patient Account Number: [id number] Date of Birth/Sex: Treating RN: 10-12-1955 (66 y.o. [name] Primary Care Angelette Ganus: [name] Other Clinician: Referring Robi Dewolfe: Treating Kaity Pitstick/Extender: Dimitri Ped Weeks in Treatment: 9 Complex Wound Management Criteria Palliative Wound Management Criteria Patient has a terminal condition and Advanced Wound Management would negatively impact the patient's quality of life. Progressing Multiple Sclerosis Care Approach Wound Care Plan: Palliative Wound Management Electronic Signature(s) Signed: 11/10/2021 4:09:32 PM By: Lorrin Jackson Signed: 11/10/2021 4:29:03 PM By: [name] DO Entered By: Lorrin Jackson on 11/10/2021 16:09:32 -------------------------------------------------------------------------------- Encounter Discharge Information Details Patient Name: Date of Service: Jocelyn Sanchez [name] 11/10/2021 10:15 A M Medical Record Number: [id number] Patient Account Number: [id number] Date of Birth/Sex: Treating RN: 29-Jun-1955 (66 y.o. [name] Primary Care Aquila Menzie: [name] Other Clinician: Referring Shawnda Mauney: Treating Zaidyn Claire/Extender: Dimitri Ped Weeks in Treatment: 9 Encounter Discharge Information Items Discharge Condition: Stable Ambulatory Status: Stretcher Discharge Destination: Home Transportation: Ambulance Schedule Follow-up Appointment: Yes Clinical Summary of Care: Provided on 11/10/2021 Form Type Recipient Paper Patient Patient Electronic Signature(s) Signed: 11/10/2021 11:57:05 AM By: Lorrin Jackson Entered By: Lorrin Jackson on 11/10/2021 11:57:05 -------------------------------------------------------------------------------- Lower Extremity Assessment Details Patient Name: Date of Service: Jocelyn Sanchez [name] 11/10/2021 10:15 A M Medical Record Number: [id number] Patient Account Number: [id number] Date of Birth/Sex: Treating RN: May 09, 1956 (66 y.o. [name] Primary Care Shamaine Mulkern: [name] Other Clinician: Referring Kaylem Gidney: Treating Divinity Kyler/Extender: Dimitri Ped Weeks in Treatment: 9 Electronic Signature(s) Signed: 11/10/2021  4:54:47 PM By: Lorrin Jackson Entered By: Lorrin Jackson on 11/10/2021 10:55:20 -------------------------------------------------------------------------------- Multi Wound Chart Details Patient Name: Date of Service: Jocelyn Sanchez [name] 11/10/2021 10:15 A M Medical Record Number: [id number] Patient Account Number: [id number] Date of Birth/Sex: Treating RN: 1955/06/25 (66 y.o. [name] Primary Care Jamira Barfuss: [name] Other Clinician: Referring Kasiya Burck: Treating Sheba Whaling/Extender: Dimitri Ped Weeks in Treatment: 9 Vital  Signs Height(in): Pulse(bpm): 83 Weight(lbs): Blood Pressure(mmHg): 88/53 Body Mass Index(BMI): Temperature(F): 98.1 Respiratory Rate(breaths/min): 18 Photos: [N/A:N/A] Sacrum N/A N/A Wound Location: Pressure Injury N/A N/A Wounding Event: Pressure Ulcer N/A N/A Primary Etiology: Anemia, Congestive Heart Failure, N/A N/A Comorbid History: Deep Vein Thrombosis, Hypertension, Peripheral Venous Disease, Type II Diabetes 07/20/2020 N/A N/A Date Acquired: 9 N/A N/A Weeks of Treatment: Open N/A N/A Wound Status: No N/A N/A Wound Recurrence: 2.5x1.5x0.6 N/A N/A Measurements L x W x D (cm) 2.945 N/A N/A A (cm) : rea 1.767 N/A N/A Volume (cm) : 43.20% N/A N/A % Reduction in A rea: 51.30% N/A N/A % Reduction in Volume: 7 Starting Position 1 (o'clock): 11 Ending Position 1 (o'clock): 1.5 Maximum Distance 1 (cm): Yes N/A N/A Undermining: Category/Stage III N/A N/A Classification: Medium N/A N/A Exudate A mount: Serosanguineous N/A N/A Exudate Type: red, brown N/A N/A Exudate Color: Well defined, not attached N/A N/A Wound Margin: Large (67-100%) N/A N/A Granulation A mount: Red, Pink N/A N/A Granulation Quality: None Present (0%) N/A N/A Necrotic A mount: Fat Layer (Subcutaneous Tissue): Yes N/A N/A Exposed Structures: Fascia: No Tendon: No Muscle: No Joint: No Bone: No Medium (34-66%) N/A  N/A Epithelialization: Treatment Notes Wound #6 (Sacrum) Cleanser Soap and Water Discharge Instruction: May shower and wash wound with dial antibacterial soap and water prior to dressing change. Wound Cleanser Discharge Instruction: Cleanse the wound with wound cleanser prior to applying a clean dressing using gauze sponges, not tissue or cotton balls. Peri-Wound Care Barrier Cream of Choice Topical Keystone Antibiotic Compound Discharge Instruction: Start when obtained Primary Dressing Promogran Prisma Matrix, 4.34 (sq in) (silver collagen) Discharge Instruction: Moisten collagen with hydrogel Secondary Dressing Bordered Gauze, 4x4 in Discharge Instruction: Saline moist gauze in wound over Prisma Zetuvit Plus Silicone Border Dressing 5x5 (in/in) Discharge Instruction: Or equivalent foam border (pt prefers square dressing) Secured With Compression Wrap Compression Stockings Add-Ons Electronic Signature(s) Signed: 11/10/2021 3:25:20 PM By: [name] DO Signed: 11/10/2021 4:54:47 PM By: Lorrin Jackson Entered By: [name] on 11/10/2021 15:14:10 -------------------------------------------------------------------------------- Multi-Disciplinary Care Plan Details Patient Name: Date of Service: Jocelyn Sanchez [name] 11/10/2021 10:15 A M Medical Record Number: [id number] Patient Account Number: [id number] Date of Birth/Sex: Treating RN: [birth year] (66 y.o. [name] Primary Care Christopher Glasscock: [name] Other Clinician: Referring Noemi Ishmael: Treating Mirel Hundal/Extender: Dimitri Ped Weeks in Treatment: 9 Active Inactive Pressure Nursing Diagnoses: Knowledge deficit related to causes and risk factors for pressure ulcer development Goals: Patient will remain free of pressure ulcers Date Initiated: 09/08/2021 Target Resolution Date: 12/15/2021 Goal Status: Active Interventions: Assess offloading mechanisms upon admission and as  needed Assess potential for pressure ulcer upon admission and as needed Provide education on pressure ulcers Notes: 11/10/21: No new pressure injuries. Pressure relief ongoing. Wound/Skin Impairment Nursing Diagnoses: Impaired tissue integrity Goals: Patient/caregiver will verbalize understanding of skin care regimen Date Initiated: 09/08/2021 Target Resolution Date: 12/15/2021 Goal Status: Active Ulcer/skin breakdown will have a volume reduction of 30% by week 4 Date Initiated: 09/08/2021 Date Inactivated: 10/14/2021 Target Resolution Date: 10/06/2021 Goal Status: Met Ulcer/skin breakdown will have a volume reduction of 50% by week 8 Date Initiated: 10/14/2021 Date Inactivated: 11/10/2021 Target Resolution Date: 11/10/2021 Goal Status: Met Ulcer/skin breakdown will have a volume reduction of 80% by week 12 Date Initiated: 11/10/2021 Target Resolution Date: 12/15/2021 Goal Status: Active Interventions: Assess patient/caregiver ability to obtain necessary supplies Assess patient/caregiver ability to perform ulcer/skin care regimen upon admission and as needed Assess  ulceration(s) every visit Provide education on ulcer and skin care Treatment Activities: Topical wound management initiated : 09/08/2021 Notes: 11/10/21: Wound care regimen continues, patient has family and HH doing dressing changes. Electronic Signature(s) Signed: 11/10/2021 4:54:47 PM By: Lorrin Jackson Entered By: Lorrin Jackson on 11/10/2021 11:07:26 -------------------------------------------------------------------------------- Pain Assessment Details Patient Name: Date of Service: Jocelyn Sanchez [name] 11/10/2021 10:15 A M Medical Record Number: [id number] Patient Account Number: [id number] Date of Birth/Sex: Treating RN: 1955/09/06 (66 y.o. [name] Primary Care Winnell Bento: [name] Other Clinician: Referring Shondra Capps: Treating Jacelyn Cuen/Extender: Dimitri Ped Weeks in Treatment:  9 Active Problems Location of Pain Severity and Description of Pain Patient Has Paino No Site Locations Pain Management and Medication Current Pain Management: Electronic Signature(s) Signed: 11/10/2021 4:54:47 PM By: Lorrin Jackson Entered By: Lorrin Jackson on 11/10/2021 10:55:13 -------------------------------------------------------------------------------- Patient/Caregiver Education Details Patient Name: Date of Service: Jocelyn Sanchez [name] 5/25/2023andnbsp10:15 A M Medical Record Number: [id number] Patient Account Number: [id number] Date of Birth/Gender: Treating RN: 05-23-1956 (66 y.o. [name] Primary Care Physician: [name] Other Clinician: Referring Physician: Treating Physician/Extender: Marla Roe in Treatment: 9 Education Assessment Education Provided To: Patient Education Topics Provided Pressure: Methods: Explain/Verbal, Printed Responses: State content correctly Wound/Skin Impairment: Methods: Explain/Verbal, Printed Responses: State content correctly Electronic Signature(s) Signed: 11/10/2021 4:54:47 PM By: Lorrin Jackson Entered By: Lorrin Jackson on 11/10/2021 11:06:45 -------------------------------------------------------------------------------- Wound Assessment Details Patient Name: Date of Service: Jocelyn Sanchez [name] 11/10/2021 10:15 A M Medical Record Number: [id number] Patient Account Number: [id number] Date of Birth/Sex: Treating RN: [birth year] (66 y.o. [name] Primary Care Teya Otterson: [name] Other Clinician: Referring Melisssa Donner: Treating Jeilyn Reznik/Extender: Dimitri Ped Weeks in Treatment: 9 Wound Status Wound Number: 6 Primary Pressure Ulcer Etiology: Wound Location: Sacrum Wound Open Wounding Event: Pressure Injury Status: Date Acquired: 07/20/2020 Comorbid Anemia, Congestive Heart Failure, Deep Vein Thrombosis, Weeks Of Treatment: 9 History:  Hypertension, Peripheral Venous Disease, Type II Diabetes Clustered Wound: No Photos Wound Measurements Length: (cm) 2.5 Width: (cm) 1.5 Depth: (cm) 0.6 Area: (cm) 2.945 Volume: (cm) 1.767 % Reduction in Area: 43.2% % Reduction in Volume: 51.3% Epithelialization: Medium (34-66%) Undermining: Yes Starting Position (o'clock): 7 Ending Position (o'clock): 11 Maximum Distance: (cm) 1.5 Wound Description Classification: Category/Stage III Wound Margin: Well defined, not attached Exudate Amount: Medium Exudate Type: Serosanguineous Exudate Color: red, brown Foul Odor After Cleansing: No Slough/Fibrino No Wound Bed Granulation Amount: Large (67-100%) Exposed Structure Granulation Quality: Red, Pink Fascia Exposed: No Necrotic Amount: None Present (0%) Fat Layer (Subcutaneous Tissue) Exposed: Yes Tendon Exposed: No Muscle Exposed: No Joint Exposed: No Bone Exposed: No Treatment Notes Wound #6 (Sacrum) Cleanser Soap and Water Discharge Instruction: May shower and wash wound with dial antibacterial soap and water prior to dressing change. Wound Cleanser Discharge Instruction: Cleanse the wound with wound cleanser prior to applying a clean dressing using gauze sponges, not tissue or cotton balls. Peri-Wound Care Barrier Cream of Choice Topical Keystone Antibiotic Compound Discharge Instruction: Start when obtained Primary Dressing Promogran Prisma Matrix, 4.34 (sq in) (silver collagen) Discharge Instruction: Moisten collagen with hydrogel Secondary Dressing Bordered Gauze, 4x4 in Discharge Instruction: Saline moist gauze in wound over Prisma Zetuvit Plus Silicone Border Dressing 5x5 (in/in) Discharge Instruction: Or equivalent foam border (pt prefers square dressing) Secured With Compression Wrap Compression Stockings Add-Ons Electronic Signature(s) Signed: 11/10/2021 4:54:47 PM By: Lorrin Jackson Entered By: Lorrin Jackson on 11/10/2021  11:01:23 --------------------------------------------------------------------------------  Vitals Details Patient Name: Date of Service: Jocelyn Sanchez [name] 11/10/2021 10:15 A M Medical Record Number: [id number] Patient Account Number: [id number] Date of Birth/Sex: Treating RN: 09-Jun-1956 (66 y.o. [name] Primary Care Lemonte Al: [name] Other Clinician: Referring Sarayah Bacchi: Treating Shaheer Bonfield/Extender: Dimitri Ped Weeks in Treatment: 9 Vital Signs Time Taken: 10:54 Temperature (F): 98.1 Pulse (bpm): 83 Respiratory Rate (breaths/min): 18 Blood Pressure (mmHg): 88/53 Reference Range: 80 - 120 mg / dl Electronic Signature(s) Signed: 11/10/2021 4:54:47 PM By: Lorrin Jackson Entered By: Lorrin Jackson on 11/10/2021 10:55:06

## 2021-11-29 ENCOUNTER — Ambulatory Visit (HOSPITAL_COMMUNITY)
Admission: RE | Admit: 2021-11-29 | Discharge: 2021-11-29 | Disposition: A | Discharge status: Home / Self Care | Payer: Medicare Other | Source: Ambulatory Visit | Attending: Internal Medicine | Admitting: Internal Medicine

## 2021-11-29 ENCOUNTER — Other Ambulatory Visit (HOSPITAL_COMMUNITY): Payer: Self-pay | Admitting: Internal Medicine

## 2021-11-29 DIAGNOSIS — L89153 Pressure ulcer of sacral region, stage 3: Secondary | ICD-10-CM

## 2021-11-29 NOTE — Progress Notes (Signed)
TONAE, LIVOLSI ([id number]) Visit Report for 10/14/2021 Arrival Information Details Patient Name: Date of Service: Jocelyn Sanchez [name] 10/14/2021 10:15 A M Medical Record Number: [id number] Patient Account Number: [id number] Date of Birth/Sex: Treating RN: 09/09/1955 (66 y.o. [name] Primary Care Cherryl Babin: [name] Other Clinician: Referring Kiyah Demartini: Treating Staceyann Knouff/Extender: Drucilla Schmidt Weeks in Treatment: 5 Visit Information History Since Last Visit Added or deleted any medications: No Patient Arrived: Stretcher Any new allergies or adverse reactions: No Arrival Time: 10:13 Had a fall or experienced change in No Accompanied By: alone activities of daily living that may affect Transfer Assistance: Manual risk of falls: Signs or symptoms of abuse/neglect since last visito No Hospitalized since last visit: No Has Dressing in Place as Prescribed: Yes Pain Present Now: No Electronic Signature(s) Signed: 11/29/2021 8:47:56 AM By: Erenest Blank Entered By: Erenest Blank on 10/14/2021 10:18:13 -------------------------------------------------------------------------------- Clinic Level of Care Assessment Details Patient Name: Date of Service: Jocelyn Sanchez [name] 10/14/2021 10:15 A M Medical Record Number: [id number] Patient Account Number: [id number] Date of Birth/Sex: Treating RN: 16-Aug-1955 (66 y.o. [name] Primary Care Wana Mount: [name] Other Clinician: Referring Jeyda Siebel: Treating Ludwig Tugwell/Extender: Drucilla Schmidt Weeks in Treatment: 5 Clinic Level of Care Assessment Items TOOL 4 Quantity Score X- 1 0 Use when only an EandM is performed on FOLLOW-UP visit ASSESSMENTS - Nursing Assessment / Reassessment X- 1 10 Reassessment of Co-morbidities (includes updates in patient status) X- 1 5 Reassessment of Adherence to Treatment Plan ASSESSMENTS - Wound and Skin A ssessment / Reassessment X - Simple  Wound Assessment / Reassessment - one wound 1 5 _0  - 0 Complex Wound Assessment / Reassessment - multiple wounds _1  - 0 Dermatologic / Skin Assessment (not related to wound area) ASSESSMENTS - Focused Assessment _2  - 0 Circumferential Edema Measurements - multi extremities _3  - 0 Nutritional Assessment / Counseling / Intervention _4  - 0 Lower Extremity Assessment (monofilament, tuning fork, pulses) _5  - 0 Peripheral Arterial Disease Assessment (using hand held doppler) ASSESSMENTS - Ostomy and/or Continence Assessment and Care _6  - 0 Incontinence Assessment and Management _7  - 0 Ostomy Care Assessment and Management (repouching, etc.) PROCESS - Coordination of Care _8  - 0 Simple Patient / Family Education for ongoing care X- 1 20 Complex (extensive) Patient / Family Education for ongoing care X- 1 10 Staff obtains Consents, Records, T Results / Process Orders est X- 1 10 Staff telephones HHA, Nursing Homes / Clarify orders / etc _9  - 0 Routine Transfer to another Facility (non-emergent condition) _10  - 0 Routine Hospital Admission (non-emergent condition) _11  - 0 New Admissions / Biomedical engineer / Ordering NPWT Apligraf, etc. , _12  - 0 Emergency Hospital Admission (emergent condition) _13  - 0 Simple Discharge Coordination _14  - 0 Complex (extensive) Discharge Coordination PROCESS - Special Needs _15  - 0 Pediatric / Minor Patient Management _16  - 0 Isolation Patient Management _17  - 0 Hearing / Language / Visual special needs _18  - 0 Assessment of Community assistance (transportation, D/C planning, etc.) _19  - 0 Additional assistance / Altered mentation _20  - 0 Support Surface(s) Assessment (bed, cushion, seat, etc.) INTERVENTIONS - Wound Cleansing / Measurement X - Simple Wound Cleansing - one wound 1 5 _21  - 0 Complex Wound Cleansing - multiple wounds X- 1 5 Wound Imaging (photographs - any number of wounds) _22  - 0 Wound Tracing (instead of photographs) X-  1 5 Simple Wound Measurement - one wound _23  -  0 Complex Wound Measurement - multiple wounds INTERVENTIONS - Wound Dressings _0  - 0 Small Wound Dressing one or multiple wounds X- 1 15 Medium Wound Dressing one or multiple wounds _1  - 0 Large Wound Dressing one or multiple wounds <VQQVZDGLOVFIEPPI>_9<\/JJOACZYSAYTKZSWF>_0  - 0 Application of Medications - topical <XNATFTDDUKGURKYH>_0<\/WCBJSEGBTDVVOHYW>_7  - 0 Application of Medications - injection INTERVENTIONS - Miscellaneous _4  - 0 External ear exam _5  - 0 Specimen Collection (cultures, biopsies, blood, body fluids, etc.) _6  - 0 Specimen(s) / Culture(s) sent or taken to Lab for analysis _7  - 0 Patient Transfer (multiple staff / Harrel Lemon Lift / Similar devices) _8  - 0 Simple Staple / Suture removal (25 or less) _9  - 0 Complex Staple / Suture removal (26 or more) _10  - 0 Hypo / Hyperglycemic Management (close monitor of Blood Glucose) _11  - 0 Ankle / Brachial Index (ABI) - do not check if billed separately X- 1 5 Vital Signs Has the patient been seen at the hospital within the last three years: Yes Total Score: 95 Level Of Care: New/Established - Level 3 Electronic Signature(s) Signed: 10/14/2021 11:51:28 AM By: Lorrin Jackson Entered By: Lorrin Jackson on 10/14/2021 10:59:45 -------------------------------------------------------------------------------- Encounter Discharge Information Details Patient Name: Date of Service: Jocelyn Sanchez [name] 10/14/2021 10:15 A M Medical Record Number: [id number] Patient Account Number: [id number] Date of Birth/Sex: Treating RN: [birth year] (66 y.o. [name] Primary Care Tayvia Faughnan: [name] Other Clinician: Referring Zaiyden Strozier: Treating Tomi Paddock/Extender: Alycia Rossetti in Treatment: 5 Encounter Discharge Information Items Discharge Condition: Stable Ambulatory Status: Stretcher Discharge Destination: Home Transportation: Ambulance Schedule Follow-up Appointment: Yes Clinical Summary of Care: Provided on 10/14/2021 Form Type  Recipient Paper Patient Patient Electronic Signature(s) Signed: 10/14/2021 11:51:28 AM By: Lorrin Jackson Entered By: Lorrin Jackson on 10/14/2021 11:00:48 -------------------------------------------------------------------------------- Lower Extremity Assessment Details Patient Name: Date of Service: Jocelyn Sanchez [name] 10/14/2021 10:15 A M Medical Record Number: [id number] Patient Account Number: [id number] Date of Birth/Sex: Treating RN: 12-25-1955 (66 y.o. [name] Primary Care Merle Whitehorn: [name] Other Clinician: Referring Kyleeann Cremeans: Treating Reis Pienta/Extender: Salvadore Oxford, Lorenda Ishihara Weeks in Treatment: 5 Electronic Signature(s) Signed: 10/14/2021 11:51:28 AM By: Lorrin Jackson Signed: 11/29/2021 8:47:56 AM By: Erenest Blank Entered By: Erenest Blank on 10/14/2021 10:19:11 -------------------------------------------------------------------------------- Multi Wound Chart Details Patient Name: Date of Service: Ledora Bottcher, Lindie Spruce [name] 10/14/2021 10:15 A M Medical Record Number: [id number] Patient Account Number: [id number] Date of Birth/Sex: Treating RN: Sep 06, 1955 (66 y.o. [name] Primary Care Ariel Dimitri: Other Clinician: [name] Referring Valoree Agent: Treating Maryama Kuriakose/Extender: Drucilla Schmidt Weeks in Treatment: 5 Vital Signs Height(in): Pulse(bpm): 48 Weight(lbs): Blood Pressure(mmHg): 117/78 Body Mass Index(BMI): Temperature(F): 98.2 Respiratory Rate(breaths/min): 16 Photos: [N/A:N/A] Sacrum N/A N/A Wound Location: Pressure Injury N/A N/A Wounding Event: Pressure Ulcer N/A N/A Primary Etiology: Anemia, Congestive Heart Failure, N/A N/A Comorbid History: Deep Vein Thrombosis, Hypertension, Peripheral Venous Disease, Type II Diabetes 07/20/2020 N/A N/A Date Acquired: 5 N/A N/A Weeks of Treatment: Open N/A N/A Wound Status: No N/A N/A Wound Recurrence: 2.3x1.4x0.7 N/A N/A Measurements L x W x D  (cm) 2.529 N/A N/A A (cm) : rea 1.77 N/A N/A Volume (cm) : 51.20% N/A N/A % Reduction in A rea: 51.20% N/A N/A % Reduction in Volume: 7 Starting Position 1 (o'clock): 12 Ending Position 1 (o'clock): 1.8 Maximum Distance 1 (cm): Yes N/A N/A Undermining: Category/Stage III N/A N/A Classification: Medium N/A N/A Exudate A mount: Serosanguineous N/A N/A Exudate Type: red, brown  N/A N/A Exudate Color: Well defined, not attached N/A N/A Wound Margin: Large (67-100%) N/A N/A Granulation A mount: Red, Pink N/A N/A Granulation Quality: None Present (0%) N/A N/A Necrotic A mount: Fat Layer (Subcutaneous Tissue): Yes N/A N/A Exposed Structures: Fascia: No Tendon: No Muscle: No Joint: No Bone: No Medium (34-66%) N/A N/A Epithelialization: Treatment Notes Electronic Signature(s) Signed: 10/14/2021 11:51:28 AM By: Lorrin Jackson Signed: 10/14/2021 4:01:11 PM By: Linton Ham MD Entered By: Linton Ham on 10/14/2021 10:53:46 -------------------------------------------------------------------------------- Multi-Disciplinary Care Plan Details Patient Name: Date of Service: Ledora Bottcher, Lindie Spruce [name] 10/14/2021 10:15 A M Medical Record Number: [id number] Patient Account Number: [id number] Date of Birth/Sex: Treating RN: November 25, 1955 (66 y.o. [name] Primary Care Matalynn Graff: [name] Other Clinician: Referring Emeline Simpson: Treating Travelle Mcclimans/Extender: Drucilla Schmidt Weeks in Treatment: 5 Active Inactive Pressure Nursing Diagnoses: Knowledge deficit related to causes and risk factors for pressure ulcer development Goals: Patient will remain free of pressure ulcers Date Initiated: 09/08/2021 Target Resolution Date: 11/10/2021 Goal Status: Active Interventions: Assess offloading mechanisms upon admission and as needed Assess potential for pressure ulcer upon admission and as needed Provide education on pressure ulcers Notes: Wound/Skin  Impairment Nursing Diagnoses: Impaired tissue integrity Goals: Patient/caregiver will verbalize understanding of skin care regimen Date Initiated: 09/08/2021 Target Resolution Date: 11/10/2021 Goal Status: Active Ulcer/skin breakdown will have a volume reduction of 30% by week 4 Date Initiated: 09/08/2021 Date Inactivated: 10/14/2021 Target Resolution Date: 10/06/2021 Goal Status: Met Ulcer/skin breakdown will have a volume reduction of 50% by week 8 Date Initiated: 10/14/2021 Target Resolution Date: 11/10/2021 Goal Status: Active Interventions: Assess patient/caregiver ability to obtain necessary supplies Assess patient/caregiver ability to perform ulcer/skin care regimen upon admission and as needed Assess ulceration(s) every visit Provide education on ulcer and skin care Treatment Activities: Topical wound management initiated : 09/08/2021 Notes: Electronic Signature(s) Signed: 10/14/2021 11:51:28 AM By: Lorrin Jackson Entered By: Lorrin Jackson on 10/14/2021 10:34:14 -------------------------------------------------------------------------------- Pain Assessment Details Patient Name: Date of Service: Jocelyn Sanchez [name] 10/14/2021 10:15 A M Medical Record Number: [id number] Patient Account Number: [id number] Date of Birth/Sex: Treating RN: Nov 11, 1955 (66 y.o. [name] Primary Care Marvette Schamp: [name] Other Clinician: Referring Osric Klopf: Treating Dione Mccombie/Extender: Drucilla Schmidt Weeks in Treatment: 5 Active Problems Location of Pain Severity and Description of Pain Patient Has Paino No Site Locations Pain Management and Medication Current Pain Management: Electronic Signature(s) Signed: 10/14/2021 11:51:28 AM By: Lorrin Jackson Signed: 11/29/2021 8:47:56 AM By: Erenest Blank Entered By: Erenest Blank on 10/14/2021 10:19:01 -------------------------------------------------------------------------------- Patient/Caregiver Education  Details Patient Name: Date of Service: Jocelyn Sanchez [name] 4/28/2023andnbsp10:15 A M Medical Record Number: [id number] Patient Account Number: [id number] Date of Birth/Gender: Treating RN: Sep 04, 1955 (66 y.o. [name] Primary Care Physician: [name] Other Clinician: Referring Physician: Treating Physician/Extender: Alycia Rossetti in Treatment: 5 Education Assessment Education Provided To: Patient and Caregiver Education Topics Provided Pressure: Methods: Explain/Verbal, Printed Responses: State content correctly Wound/Skin Impairment: Methods: Explain/Verbal, Printed Responses: State content correctly Electronic Signature(s) Signed: 10/14/2021 11:51:28 AM By: Lorrin Jackson Entered By: Lorrin Jackson on 10/14/2021 10:33:53 -------------------------------------------------------------------------------- Wound Assessment Details Patient Name: Date of Service: Jocelyn Sanchez [name] 10/14/2021 10:15 A M Medical Record Number: [id number] Patient Account Number: [id number] Date of Birth/Sex: Treating RN: January 30, 1956 (66 y.o. [name] Primary Care Therman Hughlett: [name] Other Clinician: Referring Glena Pharris: Treating Mckaylin Bastien/Extender: Drucilla Schmidt Weeks in Treatment:  5 Wound Status Wound Number: 6 Primary Pressure Ulcer Etiology: Wound Location: Sacrum Wound Open Wounding Event: Pressure Injury Status: Date Acquired: 07/20/2020 Comorbid Anemia, Congestive Heart Failure, Deep Vein Thrombosis, Weeks Of Treatment: 5 History: Hypertension, Peripheral Venous Disease, Type II Diabetes Clustered Wound: No Photos Wound Measurements Length: (cm) 2.3 Width: (cm) 1.4 Depth: (cm) 0.7 Area: (cm) 2.529 Volume: (cm) 1.77 % Reduction in Area: 51.2% % Reduction in Volume: 51.2% Epithelialization: Medium (34-66%) Tunneling: No Undermining: Yes Starting Position (o'clock): 7 Ending Position (o'clock): 12 Maximum  Distance: (cm) 1.8 Wound Description Classification: Category/Stage III Wound Margin: Well defined, not attached Exudate Amount: Medium Exudate Type: Serosanguineous Exudate Color: red, brown Foul Odor After Cleansing: No Slough/Fibrino No Wound Bed Granulation Amount: Large (67-100%) Exposed Structure Granulation Quality: Red, Pink Fascia Exposed: No Necrotic Amount: None Present (0%) Fat Layer (Subcutaneous Tissue) Exposed: Yes Tendon Exposed: No Muscle Exposed: No Joint Exposed: No Bone Exposed: No Electronic Signature(s) Signed: 10/14/2021 11:51:28 AM By: Lorrin Jackson Entered By: Lorrin Jackson on 10/14/2021 10:32:22 -------------------------------------------------------------------------------- Vitals Details Patient Name: Date of Service: Jocelyn Sanchez [name] 10/14/2021 10:15 A M Medical Record Number: [id number] Patient Account Number: [id number] Date of Birth/Sex: Treating RN: 1956-06-07 (66 y.o. [name] Primary Care Rondale Nies: [name] Other Clinician: Referring Drystan Reader: Treating Aristotelis Vilardi/Extender: Drucilla Schmidt Weeks in Treatment: 5 Vital Signs Time Taken: 10:17 Temperature (F): 98.2 Pulse (bpm): 85 Respiratory Rate (breaths/min): 16 Blood Pressure (mmHg): 117/78 Reference Range: 80 - 120 mg / dl Electronic Signature(s) Signed: 11/29/2021 8:47:56 AM By: Erenest Blank Entered By: Erenest Blank on 10/14/2021 10:18:51

## 2021-12-08 ENCOUNTER — Encounter (HOSPITAL_BASED_OUTPATIENT_CLINIC_OR_DEPARTMENT_OTHER): Payer: Medicare Other | Admitting: Internal Medicine

## 2021-12-22 ENCOUNTER — Encounter (HOSPITAL_BASED_OUTPATIENT_CLINIC_OR_DEPARTMENT_OTHER): Payer: Medicare Other | Attending: Internal Medicine | Admitting: Internal Medicine

## 2021-12-22 DIAGNOSIS — L89153 Pressure ulcer of sacral region, stage 3: Secondary | ICD-10-CM | POA: Diagnosis not present

## 2021-12-22 DIAGNOSIS — Z86711 Personal history of pulmonary embolism: Secondary | ICD-10-CM | POA: Diagnosis not present

## 2021-12-22 DIAGNOSIS — E1151 Type 2 diabetes mellitus with diabetic peripheral angiopathy without gangrene: Secondary | ICD-10-CM | POA: Diagnosis not present

## 2021-12-22 DIAGNOSIS — Z86718 Personal history of other venous thrombosis and embolism: Secondary | ICD-10-CM | POA: Insufficient documentation

## 2021-12-22 DIAGNOSIS — I110 Hypertensive heart disease with heart failure: Secondary | ICD-10-CM | POA: Insufficient documentation

## 2021-12-22 DIAGNOSIS — G35 Multiple sclerosis: Secondary | ICD-10-CM | POA: Diagnosis not present

## 2021-12-22 DIAGNOSIS — I509 Heart failure, unspecified: Secondary | ICD-10-CM | POA: Insufficient documentation

## 2021-12-22 DIAGNOSIS — E11622 Type 2 diabetes mellitus with other skin ulcer: Secondary | ICD-10-CM | POA: Insufficient documentation

## 2021-12-22 DIAGNOSIS — I890 Lymphedema, not elsewhere classified: Secondary | ICD-10-CM | POA: Insufficient documentation

## 2021-12-22 NOTE — Progress Notes (Signed)
Jocelyn Sanchez, Jocelyn Sanchez ([id number]) Visit Report for 12/22/2021 Chief Complaint Document Details Patient Name: Date of Service: Jocelyn Meeker [name] 12/22/2021 10:15 A M Medical Record Number: [id number] Patient Account Number: [id number] Date of Birth/Sex: Treating RN: [birth year] (66 y.o. [name] Primary Care Provider: [name] Other Clinician: Referring Provider: Treating Provider/Extender: Dimitri Ped Weeks in Treatment: 15 Information Obtained from: Patient Chief Complaint evaluation for sacral pressure ulcer 10/19/2020; patient is here for review of a sacral pressure ulcer which I think is in the same position as last time 3/23/2023patient presents for review of sacral pressure ulcer that has not healed in 6 years. She has been followed in our clinic for the past 6 years for this issue. She was last seen 05/2021 Electronic Signature(s) Signed: 12/22/2021 2:35:21 PM By: [name] DO Entered By: [name] on 12/22/2021 12:20:41 -------------------------------------------------------------------------------- HPI Details Patient Name: Date of Service: Jocelyn Meeker [name] 12/22/2021 10:15 A M Medical Record Number: [id number] Patient Account Number: [id number] Date of Birth/Sex: Treating RN: [birth year] (66 y.o. [name] Primary Care Provider: [name] Other Clinician: Referring Provider: Treating Provider/Extender: Dimitri Ped Weeks in Treatment: 15 History of Present Illness Location: Midline sacral region Quality: Patient describes this as a soreness Severity: 2 out of 10 Duration: Greater than one year prior to presentation to the wound center Timing: Pain occurs most often with dressing changes Context: Developmental result of pressure over time to the sacral region Modifying Factors: Patient has previously had this wound packed with gauze, she has had a wound VAC, and she tells me other various wound  care treatments over the past year. ssociated Signs and Symptoms: Multiple sclerosis, hypertension, diabetes mellitus type 2, lymphedema A HPI Description: 03/27/16 patient presents today on initial evaluation concerning her sacral pressure injury which has been present for roughly one year. She tells me that she does not know of any x-rays or other advanced imaging has been performed in regard to the sacral region. She has been provided with wound care at the [facility] facility at this point in time. She has MS and is not able to move she tells me "at all". She does tell me however that the facility is very good about re-positioning her at least every 2 hours. With that being said she did fell the wound VAC apparently. She had this twice the first time she tells me that it did not seem to be working very well and the second time it actually made things worse this was just 2 months ago. Therefore this was discontinued. at this point in time today she is not having any signs or symptoms of systemic infection she is on a blood thinner which is Eliquis at this point in time. 03/24/16; patient admitted to our clinic by Jeri Cos last week. She is a lady with advanced MS who is had a wound on her lower sacral area for most of the last year. She reiterates to me that wound vacs have not worked for her in fact she states they've actually made her wound worse on the last occasion. An x- ray was apparently done in the facility although I don't have these results. 03/31/16; this patient had an x-ray done at the facility that did not show evidence of osteomyelitis. Her bone biopsy did not show osteomyelitis. 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 She had been suggested to have collagen on the base of the  wound covered with normal saline wet to dry although her intake nurse reports there was no collagen on the wound surface. 05/15/16 wound is not changed that much. This is a substantial stage IV wound with considerable undermining from 7 to 3:00. I have spoken to the wound care nurse at the [facility] facility, there was no records on what exactly caused the wound VAC to fail or records of the same. 06/05/16; I received communication from [facility] skilled facility reporting that the patient refuses the wound back more often that it's on. When I confronted her with this today in the room she denied that she was responsible stating for the most part the problem is because she has trouble getting them to deal with her Foley catheter properly. I don't exactly follow her line of reasoning here however apparently they've been putting hydrogel wet to dry and in the facility 07/03/16; I have not heard anything from [place]. The patient states she has not had a wound VAC the last 2-3 days due to "battery problems". She arrives today with wet to dry dressing. She has advanced MS and is a chronic care resident at the skilled facility 07/31/16; patient is a resident of [facility] skilled facility. She has had the VAC applied but did not arrive with the [facility] in place. Her wound generally looks better perhaps somewhat less tunneling superiorly. There is still some exposed bone however. 09/11/16; I follow this patient with advanced MS who is a resident of [facility] skilled facility on a monthly basis. She arrives 6 weeks after her last visit. The wound VAC apparently is no longer being used according to the patient because the nurses in the facility could not maintain a seal in this area. Via the patient it seems that they're using some form of wet to dry dressing although he received no documentation. The patient states she is eating well she is being turned in the facility. 10/09/16; I follow this patient with  advanced MS who is a resident of [facility]. She apparently could not have a wound VAC is the facility could not maintain a seal therefore they're using wet-to-dry dressings however the patient arrives today with better measurements and a nicely granulated wound bed. 11/05/16; patient with advanced MS was a pressure area on the lower sacrum/coccyx. She could not have a wound VAC because the facility could n/ot maintain a seal. I ordered Prisma last time. Wound dimensions are about the same per intake 12/04/16; pressure area on the lower sacrum. using collagen. Has a new concern on the right great toe 01/01/17; patient has now only a small open area on the lower sacrumcoccyx she is been doing well and this wound is gradually progressing towards closure. The area on the right great toe that looked to me like a paronychia last time is also quite a bit better although the nail here looks as though it's loosening READMISSION 10/19/2020 This is a patient with advanced multiple sclerosis. We had her in clinic for a prolonged period in late 2017 to the summer 2018 with a stage IV wound on her lower sacrum/coccyx. I do not think we healed her out although her wound was a lot better. She was a resident at [facility] skilled facility at the time The patient states the wound reopened about 3 or 4 months  ago. I am not completely certain from talking to the patient if this is in the same position as last time or even indeed if it ever closed. 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 She has a home health aide who comes twice a day. She had an air mattress but apparently it does not work As far as I can tell there is not been any major change in her medical problem list except she lives at home now. She is a diabetic on metformin 6/23; patient has not been here in about 6-7 weeks. I am not sure what the issue  is and the patient does not seem to really know. She takes scat for transportation although that has not been a problem before. We changed her dressing last time she was here to silver collagen moistened with the backing wet- to-dry and a foam border. Her wound is quite a bit better looking. 7/14; 3-week follow-up. The patient's wound is not as good as last time. It is wider I think more undermining superiorly. Also there appears to be superficial skin breakdown superiorly and the skin around the wound and also from about 2-4 o'clock. Not a particularly viable surface. We have been using silver collagen and backing wet-to-dry. The patient lives in her own home. She apparently has care attendance for almost all of the day. She has a surface on her bed but this is clearly not a level 3. She also has home health. She claims to be eating well 8/4; patient presents for 3-week follow-up. She has no issues or complaints today. She reports using wet-to-dry dressings for her sacral wound and collagen to the buttocks wound. She states that her new mattress is arriving today 9/29; its been almost 2 months since the patient was in the clinic. She has a sacral wound which is worse I think with exposed bone to the right under the undermining area. Also concerning is a marked deterioration of her left buttock. With a superficial necrotic area and several small superficial areas. She has been using silver collagen wet-to-dry. The patient has a level 2 surface certainly would benefit from a level 3 if that can be provided. She is relatively immobile cannot turn her self secondary to multiple sclerosis. She tells me she has an aide for most of the day except for 4 hours. She also states she is not up much in a wheelchair. I wonder about her oral intake although she says she eats 3 meals a day and takes Glucerna 10/13; the patient looks somewhat better than 2 weeks ago today. I gave her empiric Augmentin which she took  a mobile x-ray was negative for osteomyelitis but they suggested a bone scan. The wound area actually looks somewhat better today 10/27; comes in today with exposed bone complaints of more pain and odor. She looks as though she is losing weight although she claims to be eating well. She takes Ensure 11/16; patient came in 2 weeks ago with exposed bone. I did a biopsy this was negative for osteomyelitis. Bone culture was also negative showing multiple organisms. I believe I did give her Augmentin which gave her some diarrhea but she does not need further antibiotic. We are using silver alginate to this wound and also the closely position superficial area on the skin just above the wound. She managed to get a level  3 surface that we had ordered she says she does not like it because it is hot and noisy. But nevertheless she is using a 12/14; 1 month follow-up. The patient has 2 wounds her original deep wound on the sacrum which initially had exposed bone. This was cultured and surprisingly negative. She also superiorly had a more superficial but not a healthy looking wound above it. We are using silver alginate on both wounds. Things look improved today. She has completed her antibiotics. Given the negative culture and biopsy of the bone here I do not think any more systemic antibiotics are indicated 09/08/2021 Jocelyn Sanchez Is a 66 year old female with a past medical history of multiple sclerosis that presents to the clinic for continued follow-up of her sacral ulcer. She has no issues or complaints today. She states she was recommended to continue following up in our clinic by her home health nurse specifically to have orders for barrier cream. Patient also complains about her current bed and this is a sand mattress. She would like an air mattress. She has no complaints about the wound today. She denies systemic signs of infection. 4/28; 5-week follow-up. Wound is measuring smaller she has been  using silver alginate she has home health coming once a week and her son is changing it the rest of the days. She did not have the x-ray that was ordered last time apparently this was to be done through a mobile [employer] type company 5/25; patient presents for follow-up. She was switched to collagen at last clinic visit. She has no issues or complaints today. She was not able to obtain her x- ray. She would like another order for this as she lost the previous one. 7/6; patient presents for follow-up. She had her sacral x-ray completed that showed erosion of the sacrum and coccyx but appears very similar to 2021 in 2019 imaging. No new erosions seen. Patient has been using Keystone antibiotic based on results of a PCR culture that showed high levels of Enterococcus faecalis. She states that the ointment is irritating her. She would like to go back to a different dressing. Electronic Signature(s) Signed: 12/22/2021 2:35:21 PM By: [name] DO Entered By: [name] on 12/22/2021 12:27:26 -------------------------------------------------------------------------------- Physical Exam Details Patient Name: Date of Service: Jocelyn Meeker [name] 12/22/2021 10:15 A M Medical Record Number: [id number] Patient Account Number: [id number] Date of Birth/Sex: Treating RN: 1955-11-08 (66 y.o. [name] Primary Care Provider: [name] Other Clinician: Referring Provider: Treating Provider/Extender: Dimitri Ped Weeks in Treatment: 15 Constitutional respirations regular, non-labored and within target range for patient.Marland Kitchen Psychiatric pleasant and cooperative. Notes Sacrum: Open wound with pale granulation tissue and undermining. No signs of surrounding infection. Electronic Signature(s) Signed: 12/22/2021 2:35:21 PM By: [name] DO Entered By: [name] on 12/22/2021  12:27:44 -------------------------------------------------------------------------------- Physician Orders Details Patient Name: Date of Service: Jocelyn Meeker [name] 12/22/2021 10:15 A M Medical Record Number: [id number] Patient Account Number: [id number] Date of Birth/Sex: Treating RN: 09-Nov-1955 (66 y.o. [name] Primary Care Provider: [name] Other Clinician: Referring Provider: Treating Provider/Extender: Dimitri Ped Weeks in Treatment: 19 Verbal / Phone Orders: No Diagnosis Coding ICD-10 Coding Code Description L89.153 Pressure ulcer of sacral region, stage 3 G35 Multiple sclerosis Follow-up Appointments Return appointment in 1 month. - with Dr. Heber [place] Leveda Anna, Room 7) **Stretcher** 02/23/22 @ 10:15am Off-Loading A fluidized (Group 3) mattress ir Turn and reposition every 2 hours Additional  Orders / Instructions Follow [place] wound care orders this week; continue Home Health for wound care. May utilize formulary equivalent dressing for wound treatment orders unless otherwise specified. - Stop Keystone antibiotic compound. New dressing, Silver Alginate Other Home Health Orders/Instructions: - Medi HH: 2-3x per week Wound Treatment Wound #6 - Sacrum Cleanser: Soap and Water ([place]) 1 x Per Day/30 Days Discharge Instructions: May shower and wash wound with dial antibacterial soap and water prior to dressing change. Cleanser: Wound Cleanser (Home Health) 1 x Per Day/30 Days Discharge Instructions: Cleanse the wound with wound cleanser prior to applying a clean dressing using gauze sponges, not tissue or cotton balls. Peri-Wound Care: Barrier Cream of Choice (Home Health) 1 x Per Day/30 Days Prim Dressing: KerraCel Ag Gelling Fiber Dressing, 4x5 in (silver alginate) (Home Health) 1 x Per Day/30 Days ary Discharge Instructions: Apply silver alginate to wound bed as instructed Secondary Dressing: Woven Gauze Sponge,  Non-Sterile 4x4 in (Home Health) 1 x Per Day/30 Days Discharge Instructions: Apply over primary dressing as directed. Secondary Dressing: Zetuvit Plus Silicone Border Dressing 5x5 (in/in) (Home Health) 1 x Per Day/30 Days Discharge Instructions: Or equivalent foam border (pt prefers square dressing) Electronic Signature(s) Signed: 12/22/2021 2:35:21 PM By: [name] DO Entered By: [name] on 12/22/2021 12:27:51 -------------------------------------------------------------------------------- Problem List Details Patient Name: Date of Service: Jocelyn Meeker [name] 12/22/2021 10:15 A M Medical Record Number: [id number] Patient Account Number: [id number] Date of Birth/Sex: Treating RN: [birth year] (66 y.o. [name] Primary Care Provider: [name] Other Clinician: Referring Provider: Treating Provider/Extender: Dimitri Ped Weeks in Treatment: 15 Active Problems ICD-10 Encounter Code Description Active Date MDM Diagnosis L89.153 Pressure ulcer of sacral region, stage 3 09/08/2021 No Yes G35 Multiple sclerosis 09/08/2021 No Yes Inactive Problems Resolved Problems Electronic Signature(s) Signed: 12/22/2021 2:35:21 PM By: [name] DO Previous Signature: 12/22/2021 10:39:06 AM Version By: Lorrin Jackson Entered By: [name] on 12/22/2021 12:20:19 -------------------------------------------------------------------------------- Progress Note Details Patient Name: Date of Service: Jocelyn Meeker [name] 12/22/2021 10:15 A M Medical Record Number: [id number] Patient Account Number: [id number] Date of Birth/Sex: Treating RN: [birth year] (66 y.o. [name] Primary Care Provider: [name] Other Clinician: Referring Provider: Treating Provider/Extender: Dimitri Ped Weeks in Treatment: 15 Subjective Chief Complaint Information obtained from Patient evaluation for sacral pressure ulcer 10/19/2020;  patient is here for review of a sacral pressure ulcer which I think is in the same position as last time 09/08/2021 oopatient presents for review of sacral pressure ulcer that has not healed in 6 years. She has been followed in our clinic for the past 6 years for this issue. She was last seen 05/2021 History of Present Illness (HPI) The following HPI elements were documented for the patient's wound: Location: Midline sacral region Quality: Patient describes this as a soreness Severity: 2 out of 10 Duration: Greater than one year prior to presentation to the wound center Timing: Pain occurs most often with dressing changes Context: Developmental result of pressure over time to the sacral region Modifying Factors: Patient has previously had this wound packed with gauze, she has had a wound VAC, and she tells me other various wound care treatments over the past year. Associated Signs and Symptoms: Multiple sclerosis, hypertension, diabetes mellitus type 2, lymphedema 03/27/16 patient presents today on initial evaluation concerning her sacral pressure injury which has been present for roughly one year. She tells me that  she does not know of any x-rays or other advanced imaging has been performed in regard to the sacral region. She has been provided with wound care at the [facility] facility at this point in time. She has MS and is not able to move she tells me "at all". She does tell me however that the facility is very good about re-positioning her at least every 2 hours. With that being said she did fell the wound VAC apparently. She had this twice the first time she tells me that it did not seem to be working very well and the second time it actually made things worse this was just 2 months ago. Therefore this was discontinued. at this point in time today she is not having any signs or symptoms of systemic infection she is on a blood thinner which is Eliquis at this point in time. 03/24/16;  patient admitted to our clinic by Jeri Cos last week. She is a lady with advanced MS who is had a wound on her lower sacral area for most of the last year. She reiterates to me that wound vacs have not worked for her in fact she states they've actually made her wound worse on the last occasion. An x- ray was apparently done in the facility although I don't have these results. 03/31/16; this patient had an x-ray done at the facility that did not show evidence of osteomyelitis. Her bone biopsy did not show osteomyelitis. 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 She had been suggested to have collagen on the base of the wound covered with normal saline wet to dry although her intake nurse reports there was no collagen on the wound surface. 05/15/16 wound is not changed that much. This is a substantial stage IV wound with considerable undermining from 7 to 3:00. I have spoken to the wound care nurse at the [facility] facility, there was no records on what exactly caused the wound VAC to fail or records of the same. 06/05/16; I received communication from [facility] skilled facility reporting that the patient refuses the wound back more often that it's on. When I confronted her with this today in the room she denied that she was responsible stating for the most part the problem is because she has trouble getting them to deal with her Foley catheter properly. I don't exactly follow her line of reasoning here however apparently they've been putting hydrogel wet to dry and in the facility 07/03/16; I have not heard anything from [place]. The patient states she has not had a wound VAC the last 2-3 days due to "battery problems". She arrives today with wet to dry dressing. She has advanced MS and is a chronic care  resident at the skilled facility 07/31/16; patient is a resident of [facility] skilled facility. She has had the VAC applied but did not arrive with the [facility] in place. Her wound generally looks better perhaps somewhat less tunneling superiorly. There is still some exposed bone however. 09/11/16; I follow this patient with advanced MS who is a resident of [facility] skilled facility on a monthly basis. She arrives 6 weeks after her last visit. The wound VAC apparently is no longer being used according to the patient because the nurses in the facility could not maintain a seal in this area. Via the  patient it seems that they're using some form of wet to dry dressing although he received no documentation. The patient states she is eating well she is being turned in the facility. 10/09/16; I follow this patient with advanced MS who is a resident of [facility]. She apparently could not have a wound VAC is the facility could not maintain a seal therefore they're using wet-to-dry dressings however the patient arrives today with better measurements and a nicely granulated wound bed. 11/05/16; patient with advanced MS was a pressure area on the lower sacrum/coccyx. She could not have a wound VAC because the facility could n/ot maintain a seal. I ordered Prisma last time. Wound dimensions are about the same per intake 12/04/16; pressure area on the lower sacrum. using collagen. Has a new concern on the right great toe 01/01/17; patient has now only a small open area on the lower sacrumoococcyx she is been doing well and this wound is gradually progressing towards closure. The area on the right great toe that looked to me like a paronychia last time is also quite a bit better although the nail here looks as though it's loosening READMISSION 10/19/2020 This is a patient with advanced multiple sclerosis. We had her in clinic for a prolonged period in late 2017 to the summer 2018 with a stage IV wound on her lower  sacrum/coccyx. I do not think we healed her out although her wound was a lot better. She was a resident at [facility] skilled facility at the time The patient states the wound reopened about 3 or 4 months ago. I am not completely certain from talking to the patient if this is in the same position as last time or even indeed if it ever closed. 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 She has a home health aide who comes twice a day. She had an air mattress but apparently it does not work As far as I can tell there is not been any major change in her medical problem list except she lives at home now. She is a diabetic on metformin 6/23; patient has not been here in about 6-7 weeks. I am not sure what the issue is and the patient does not seem to really know. She takes scat for transportation although that has not been a problem before. We changed her dressing last time she was here to silver collagen moistened with the backing wet- to-dry and a foam border. Her wound is quite a bit better looking. 7/14; 3-week follow-up. The patient's wound is not as good as last time. It is wider I think more undermining superiorly. Also there appears to be superficial skin breakdown superiorly and the skin around the wound and also from about 2-4 o'clock. Not a particularly viable surface. We have been using silver collagen and backing wet-to-dry. The patient lives in her own home. She apparently has care attendance for almost all of the day. She has a surface on her bed but this is clearly not a level 3. She also has home health. She claims to be eating well 8/4; patient presents for 3-week follow-up. She has no issues or complaints today. She reports using wet-to-dry dressings for her sacral wound and collagen to the buttocks wound. She states that her new mattress is arriving today 9/29; its been almost 2  months since the patient was in the  clinic. She has a sacral wound which is worse I think with exposed bone to the right under the undermining area. Also concerning is a marked deterioration of her left buttock. With a superficial necrotic area and several small superficial areas. She has been using silver collagen wet-to-dry. The patient has a level 2 surface certainly would benefit from a level 3 if that can be provided. She is relatively immobile cannot turn her self secondary to multiple sclerosis. She tells me she has an aide for most of the day except for 4 hours. She also states she is not up much in a wheelchair. I wonder about her oral intake although she says she eats 3 meals a day and takes Glucerna 10/13; the patient looks somewhat better than 2 weeks ago today. I gave her empiric Augmentin which she took a mobile x-ray was negative for osteomyelitis but they suggested a bone scan. The wound area actually looks somewhat better today 10/27; comes in today with exposed bone complaints of more pain and odor. She looks as though she is losing weight although she claims to be eating well. She takes Ensure 11/16; patient came in 2 weeks ago with exposed bone. I did a biopsy this was negative for osteomyelitis. Bone culture was also negative showing multiple organisms. I believe I did give her Augmentin which gave her some diarrhea but she does not need further antibiotic. We are using silver alginate to this wound and also the closely position superficial area on the skin just above the wound. She managed to get a level 3 surface that we had ordered she says she does not like it because it is hot and noisy. But nevertheless she is using a 12/14; 1 month follow-up. The patient has 2 wounds her original deep wound on the sacrum which initially had exposed bone. This was cultured and surprisingly negative. She also superiorly had a more superficial but not a healthy looking wound above it. We are  using silver alginate on both wounds. Things look improved today. She has completed her antibiotics. Given the negative culture and biopsy of the bone here I do not think any more systemic antibiotics are indicated 09/08/2021 Jocelyn Sanchez Is a 66 year old female with a past medical history of multiple sclerosis that presents to the clinic for continued follow-up of her sacral ulcer. She has no issues or complaints today. She states she was recommended to continue following up in our clinic by her home health nurse specifically to have orders for barrier cream. Patient also complains about her current bed and this is a sand mattress. She would like an air mattress. She has no complaints about the wound today. She denies systemic signs of infection. 4/28; 5-week follow-up. Wound is measuring smaller she has been using silver alginate she has home health coming once a week and her son is changing it the rest of the days. She did not have the x-ray that was ordered last time apparently this was to be done through a mobile [employer] type company 5/25; patient presents for follow-up. She was switched to collagen at last clinic visit. She has no issues or complaints today. She was not able to obtain her x- ray. She would like another order for this as she lost the previous one. 7/6; patient presents for follow-up. She had her sacral x-ray completed that showed erosion of the sacrum and coccyx but appears very similar to 2021 in 2019 imaging. No new erosions seen. Patient has been using Keystone  antibiotic based on results of a PCR culture that showed high levels of Enterococcus faecalis. She states that the ointment is irritating her. She would like to go back to a different dressing. Patient History Information obtained from Patient. Family History No family history of Cancer, Diabetes, Heart Disease, Hypertension, Kidney Disease, Lung Disease, Seizures, Stroke, Thyroid Problems, Tuberculosis. Social  History Former smoker, Marital Status - Divorced, Alcohol Use - Never, Drug Use - No History, Caffeine Use - Rarely. 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 Vitals Time Taken: 10:20 AM, Temperature: 97.8 F, Pulse: 87 bpm, Respiratory Rate: 17 breaths/min, Blood Pressure: 116/72 mmHg. Psychiatric pleasant and cooperative. General Notes: Sacrum: Open wound with pale granulation tissue and undermining. No signs of surrounding infection. Integumentary (Hair, Skin) Wound #6 status is Open. Original cause of wound was Pressure Injury. The date acquired was: 07/20/2020. The wound has been in treatment 15 weeks. The wound is located on the Sacrum. The wound measures 3cm length x 2.5cm width x 1cm depth; 5.89cm^2 area and 5.89cm^3 volume. There is Fat Layer (Subcutaneous Tissue) exposed. There is no tunneling noted, however, there is undermining starting at 12:00 and ending at 12:00 with a maximum distance of 3cm. There is a medium amount of serosanguineous drainage noted. The wound margin is well defined and not attached to the wound base. There is large (67- 100%) red, pink granulation within the wound bed. There is no necrotic tissue within the wound bed. Assessment Active Problems ICD-10 Pressure  ulcer of sacral region, stage 3 Multiple sclerosis Patient's wound is stable. No surrounding signs of infection. I recommended Keystone antibiotic at last clinic visit however patient states this is irritating the wound bed. I recommended going back to silver alginate. Aggressive offloading. X-ray head no new findings of bony destruction. This is a palliative wound care case. We will see her every 2 months. She knows to call with any questions or concerns. Plan Follow-up Appointments: Return appointment in 1 month. - with Dr. Heber [place] [facility] Anna, Room 7) **Stretcher** 02/23/22 @ 10:15am Off-Loading: Air fluidized (Group 3) mattress Turn and reposition every 2 hours Additional Orders / Instructions: Follow Nutritious Diet Home Health: New wound care orders this week; continue Home Health for wound care. May utilize formulary equivalent dressing for wound treatment orders unless otherwise specified. - Stop Keystone antibiotic compound. New dressing, Silver Alginate Other Home Health Orders/Instructions: - Medi HH: 2-3x per week WOUND #6: - Sacrum Wound Laterality: Cleanser: Soap and Water (Oval) 1 x Per Day/30 Days Discharge Instructions: May shower and wash wound with dial antibacterial soap and water prior to dressing change. Cleanser: Wound Cleanser (Home Health) 1 x Per Day/30 Days Discharge Instructions: Cleanse the wound with wound cleanser prior to applying a clean dressing using gauze sponges, not tissue or cotton balls. Peri-Wound Care: Barrier Cream of Choice (Home Health) 1 x Per Day/30 Days Prim Dressing: KerraCel Ag Gelling Fiber Dressing, 4x5 in (silver alginate) (Home Health) 1 x Per Day/30 Days ary Discharge  Instructions: Apply silver alginate to wound bed as instructed Secondary Dressing: Woven Gauze Sponge, Non-Sterile 4x4 in (Home Health) 1 x Per Day/30 Days Discharge Instructions: Apply over primary dressing as directed. Secondary Dressing: Zetuvit Plus Silicone Border  Dressing 5x5 (in/in) (Home Health) 1 x Per Day/30 Days Discharge Instructions: Or equivalent foam border (pt prefers square dressing) 1. Silver alginate 2. Aggressive offloading 3. Follow-up every 2 months Electronic Signature(s) Signed: 12/22/2021 2:35:21 PM By: [name] DO Entered By: [name] on 12/22/2021 12:28:56 -------------------------------------------------------------------------------- HxROS Details Patient Name: Date of Service: Jocelyn Meeker [name] 12/22/2021 10:15 A M Medical Record Number: [id number] Patient Account Number: [id number] Date of Birth/Sex: Treating RN: Jan 17, 1956 (66 y.o. [name] Primary Care Provider: [name] Other Clinician: Referring Provider: Treating Provider/Extender: Dimitri Ped Weeks in Treatment: 15 Information Obtained From Patient Hematologic/Lymphatic Medical History: Positive for: Anemia Negative for: Lymphedema Respiratory Medical History: Past Medical History Notes: h/o bilat pulmonary embolism Cardiovascular Medical History: Positive for: Congestive Heart Failure; Deep Vein Thrombosis - (R) leg; Hypertension; Peripheral Venous Disease Gastrointestinal Medical History: Past Medical History Notes: Esophageal Stricture Endocrine Medical History: Positive for: Type II Diabetes Time with diabetes: 8 years Treated with: Oral agents Blood sugar tested every day: No Integumentary (Skin) Medical History: Negative for: History of Burn Musculoskeletal Medical History: Past Medical History Notes: MS , 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) Neurologic Medical History: Past Medical History Notes: Multiple Sclerosis Psychiatric Medical History: Negative for: Anorexia/bulimia; Confinement Anxiety Past Medical History Notes: anxiety Immunizations Pneumococcal Vaccine: Received Pneumococcal Vaccination: Yes Received Pneumococcal Vaccination On or After 60th Birthday:  No Immunization Notes: last tetanus about 3 years ago Implantable Devices None Family and Social History Cancer: No; Diabetes: No; Heart Disease: No; Hypertension: No; Kidney Disease: No; Lung Disease: No; Seizures: No; Stroke: No; Thyroid Problems: No; Tuberculosis: No; Former smoker; Marital Status - Divorced; Alcohol Use: Never; Drug Use: No History; Caffeine Use: Rarely; Financial Concerns: No; Food, Clothing or Shelter Needs: No; Support System Lacking: No; Transportation Concerns: No Electronic Signature(s) Signed: 12/22/2021 2:35:21 PM By: [name] DO Signed: 12/22/2021 4:14:47 PM By: Lorrin Jackson Entered By: [name] on 12/22/2021 12:27:30 -------------------------------------------------------------------------------- SuperBill Details Patient Name: Date of Service: Jocelyn Meeker [name] 12/22/2021 Medical Record Number: [id number] Patient Account Number: [id number] Date of Birth/Sex: Treating RN: January 16, 1956 (66 y.o. [name] Primary Care Provider: [name] Other Clinician: Referring Provider: Treating Provider/Extender: Dimitri Ped Weeks in Treatment: 15 Diagnosis Coding ICD-10 Codes Code Description L89.153 Pressure ulcer of sacral region, stage 3 G35 Multiple sclerosis Facility Procedures CPT4 Code: [id number] Description: [facility] VISIT-LEV 3 EST PT Modifier: Quantity: 1 Physician Procedures : CPT4 Code Description Modifier [id number] - WC PHYS LEVEL 3 - EST PT ICD-10 Diagnosis Description L89.153 Pressure ulcer of sacral region, stage 3 G35 Multiple sclerosis Quantity: 1 Electronic Signature(s) Signed: 12/22/2021 2:35:21 PM By: [name] DO Previous Signature: 12/22/2021 11:07:52 AM Version By: Lorrin Jackson Entered By: [name] on 12/22/2021 12:29:09

## 2021-12-22 NOTE — Progress Notes (Signed)
Holtzclaw, Jocelyn D. ([id number]) Visit Report for 12/22/2021 Arrival Information Details Patient Name: Date of Service: Jocelyn ES, [name] 12/22/2021 10:15 A M Medical Record Number: [id number] Patient Account Number: [id number] Date of Birth/Sex: Treating RN: 06/25/1955 (66 y.o. F) Breedlove, [name] Primary Care : Kadakia, Ajay S Other Clinician: Referring : Treating /Extender: Hoffman, Jessica Kadakia, Ajay S Weeks in Treatment: 15 Visit Information History Since Last Visit Added or deleted any medications: No Patient Arrived: Stretcher Any new allergies or adverse reactions: No Arrival Time: 10:20 Had a fall or experienced change in No Accompanied By: EMT's activities of daily living that may affect Transfer Assistance: Manual risk of falls: Patient Identification Verified: Yes Signs or symptoms of abuse/neglect since last visito No Secondary Verification Process Completed: Yes Hospitalized since last visit: No Patient Requires Transmission-Based Precautions: No Implantable device outside of the clinic excluding No Patient Has Alerts: No cellular tissue based products placed in the center since last visit: Has Dressing in Place as Prescribed: Yes Pain Present Now: No Electronic Signature(s) Signed: 12/22/2021 4:05:15 PM By: Breedlove, [name] RN Entered By: Breedlove, [name] on 12/22/2021 10:20:19 -------------------------------------------------------------------------------- Clinic Level of Care Assessment Details Patient Name: Date of Service: Jocelyn ES, [name] 12/22/2021 10:15 A M Medical Record Number: [id number] Patient Account Number: [id number] Date of Birth/Sex: Treating RN: 03/01/1956 (66 y.o. F) Barnhart, Jodi Primary Care : Kadakia, Ajay S Other Clinician: Referring : Treating /Extender: Hoffman, Jessica Kadakia, Ajay S Weeks in Treatment: 15 Clinic Level of Care Assessment Items TOOL 4 Quantity Score X- 1 0 Use  when only an EandM is performed on FOLLOW-UP visit ASSESSMENTS - Nursing Assessment / Reassessment X- 1 10 Reassessment of Co-morbidities (includes updates in patient status) X- 1 5 Reassessment of Adherence to Treatment Plan ASSESSMENTS - Wound and Skin A ssessment / Reassessment X - Simple Wound Assessment / Reassessment - one wound 1 5 [] - 0 Complex Wound Assessment / Reassessment - multiple wounds [] - 0 Dermatologic / Skin Assessment (not related to wound area) ASSESSMENTS - Focused Assessment [] - 0 Circumferential Edema Measurements - multi extremities [] - 0 Nutritional Assessment / Counseling / Intervention [] - 0 Lower Extremity Assessment (monofilament, tuning fork, pulses) [] - 0 Peripheral Arterial Disease Assessment (using hand held doppler) ASSESSMENTS - Ostomy and/or Continence Assessment and Care [] - 0 Incontinence Assessment and Management [] - 0 Ostomy Care Assessment and Management (repouching, etc.) PROCESS - Coordination of Care [] - 0 Simple Patient / Family Education for ongoing care X- 1 20 Complex (extensive) Patient / Family Education for ongoing care X- 1 10 Staff obtains Consents, Records, T Results / Process Orders est X- 1 10 Staff telephones HHA, Nursing Homes / Clarify orders / etc [] - 0 Routine Transfer to another Facility (non-emergent condition) [] - 0 Routine Hospital Admission (non-emergent condition) [] - 0 New Admissions / Insurance Authorizations / Ordering NPWT Apligraf, etc. , [] - 0 Emergency Hospital Admission (emergent condition) [] - 0 Simple Discharge Coordination [] - 0 Complex (extensive) Discharge Coordination PROCESS - Special Needs [] - 0 Pediatric / Minor Patient Management [] - 0 Isolation Patient Management [] - 0 Hearing / Language / Visual special needs [] - 0 Assessment of Community assistance (transportation, D/C planning, etc.) [] - 0 Additional assistance / Altered mentation [] - 0 Support  Surface(s) Assessment (bed, cushion, seat, etc.) INTERVENTIONS - Wound Cleansing / Measurement X - Simple Wound Cleansing - one wound 1 5 [] -   0 Complex Wound Cleansing - multiple wounds X- 1 5 Wound Imaging (photographs - any number of wounds) [] - 0 Wound Tracing (instead of photographs) X- 1 5 Simple Wound Measurement - one wound [] - 0 Complex Wound Measurement - multiple wounds INTERVENTIONS - Wound Dressings [] - 0 Small Wound Dressing one or multiple wounds X- 1 15 Medium Wound Dressing one or multiple wounds [] - 0 Large Wound Dressing one or multiple wounds [] - 0 Application of Medications - topical [] - 0 Application of Medications - injection INTERVENTIONS - Miscellaneous [] - 0 External ear exam [] - 0 Specimen Collection (cultures, biopsies, blood, body fluids, etc.) [] - 0 Specimen(s) / Culture(s) sent or taken to Lab for analysis [] - 0 Patient Transfer (multiple staff / Civil Service fast streamer / Similar devices) [] - 0 Simple Staple / Suture removal (25 or less) [] - 0 Complex Staple / Suture removal (26 or more) [] - 0 Hypo / Hyperglycemic Management (close monitor of Blood Glucose) [] - 0 Ankle / Brachial Index (ABI) - do not check if billed separately X- 1 5 Vital Signs Has the patient been seen at the hospital within the last three years: Yes Total Score: 95 Level Of Care: New/Established - Level 3 Electronic Signature(s) Signed: 12/22/2021 4:14:47 PM By: Lorrin Jackson Entered By: Lorrin Jackson on 12/22/2021 11:07:45 -------------------------------------------------------------------------------- Encounter Discharge Information Details Patient Name: Date of Service: Jocelyn Sanchez [name] 12/22/2021 10:15 A M Medical Record Number: [id number] Patient Account Number: [id number] Date of Birth/Sex: Treating RN: July 01, 1955 (66 y.o. [name] Primary Care Gertrude Tarbet: [name] Other Clinician: Referring Taiwan Talcott: Treating Mackensi Mahadeo/Extender: Dimitri Ped Weeks in Treatment: 15 Encounter Discharge Information Items Discharge Condition: Stable Ambulatory Status: Stretcher Discharge Destination: Home Transportation: Ambulance Schedule Follow-up Appointment: Yes Clinical Summary of Care: Provided on 12/22/2021 Form Type Recipient Paper Patient Patient Electronic Signature(s) Signed: 12/22/2021 11:08:27 AM By: Lorrin Jackson Entered By: Lorrin Jackson on 12/22/2021 11:08:27 -------------------------------------------------------------------------------- Lower Extremity Assessment Details Patient Name: Date of Service: Jocelyn Sanchez [name] 12/22/2021 10:15 A M Medical Record Number: [id number] Patient Account Number: [id number] Date of Birth/Sex: Treating RN: 01-16-1956 (66 y.o. [name], [name] Primary Care Kito Cuffe: [name] Other Clinician: Referring Vasilis Luhman: Treating Miya Luviano/Extender: Dimitri Ped Weeks in Treatment: 15 Electronic Signature(s) Signed: 12/22/2021 4:05:15 PM By: Rhae Hammock RN Entered By: Rhae Hammock on 12/22/2021 10:20:53 -------------------------------------------------------------------------------- Multi Wound Chart Details Patient Name: Date of Service: Jocelyn Sanchez [name] 12/22/2021 10:15 A M Medical Record Number: [id number] Patient Account Number: [id number] Date of Birth/Sex: Treating RN: [birth year] (66 y.o. [name] Primary Care Santana Edell: [name] Other Clinician: Referring Delia Slatten: Treating Calyssa Zobrist/Extender: Dimitri Ped Weeks in Treatment: 15 Vital Signs Height(in): Pulse(bpm): 107 Weight(lbs): Blood Pressure(mmHg): 116/72 Body Mass Index(BMI): Temperature(F): 97.8 Respiratory Rate(breaths/min): 17 Photos: [N/A:N/A] Sacrum N/A N/A Wound Location: Pressure Injury N/A N/A Wounding Event: Pressure Ulcer N/A N/A Primary Etiology: Anemia, Congestive Heart Failure, N/A N/A Comorbid  History: Deep Vein Thrombosis, Hypertension, Peripheral Venous Disease, Type II Diabetes 07/20/2020 N/A N/A Date Acquired: 15 N/A N/A Weeks of Treatment: Open N/A N/A Wound Status: No N/A N/A Wound Recurrence: 3x2.5x1 N/A N/A Measurements L x W x D (cm) 5.89 N/A N/A A (cm) : rea 5.89 N/A N/A Volume (cm) : -13.60% N/A N/A % Reduction in A rea: -62.30% N/A N/A % Reduction in Volume: 12 Starting Position 1 (o'clock): 12  Ending Position 1 (o'clock): 3 Maximum Distance 1 (cm): Yes N/A N/A Undermining: Category/Stage III N/A N/A Classification: Medium N/A N/A Exudate A mount: Serosanguineous N/A N/A Exudate Type: red, brown N/A N/A Exudate Color: Well defined, not attached N/A N/A Wound Margin: Large (67-100%) N/A N/A Granulation A mount: Red, Pink N/A N/A Granulation Quality: None Present (0%) N/A N/A Necrotic A mount: Fat Layer (Subcutaneous Tissue): Yes N/A N/A Exposed Structures: Fascia: No Tendon: No Muscle: No Joint: No Bone: No Medium (34-66%) N/A N/A Epithelialization: Treatment Notes Wound #6 (Sacrum) Cleanser Soap and Water Discharge Instruction: May shower and wash wound with dial antibacterial soap and water prior to dressing change. Wound Cleanser Discharge Instruction: Cleanse the wound with wound cleanser prior to applying a clean dressing using gauze sponges, not tissue or cotton balls. Peri-Wound Care Barrier Cream of Choice Topical Primary Dressing KerraCel Ag Gelling Fiber Dressing, 4x5 in (silver alginate) Discharge Instruction: Apply silver alginate to wound bed as instructed Secondary Dressing Woven Gauze Sponge, Non-Sterile 4x4 in Discharge Instruction: Apply over primary dressing as directed. Zetuvit Plus Silicone Border Dressing 5x5 (in/in) Discharge Instruction: Or equivalent foam border (pt prefers square dressing) Secured With Compression Wrap Compression Stockings Add-Ons Electronic Signature(s) Signed: 12/22/2021  2:35:21 PM By: Hoffman, Jessica DO Signed: 12/22/2021 4:14:47 PM By: Barnhart, Jodi Entered By: Hoffman, Jessica on 12/22/2021 12:20:26 -------------------------------------------------------------------------------- Multi-Disciplinary Care Plan Details Patient Name: Date of Service: Jocelyn ES, [name] 12/22/2021 10:15 A M Medical Record Number: [id number] Patient Account Number: [id number] Date of Birth/Sex: Treating RN: 06/05/1956 (66 y.o. F) Barnhart, Jodi Primary Care Audi Wettstein: Kadakia, Ajay S Other Clinician: Referring Verity Gilcrest: Treating Suezette Lafave/Extender: Hoffman, Jessica Kadakia, Ajay S Weeks in Treatment: 15 Active Inactive Pressure Nursing Diagnoses: Knowledge deficit related to causes and risk factors for pressure ulcer development Goals: Patient will remain free of pressure ulcers Date Initiated: 09/08/2021 Target Resolution Date: 01/12/2022 Goal Status: Active Interventions: Assess offloading mechanisms upon admission and as needed Assess potential for pressure ulcer upon admission and as needed Provide education on pressure ulcers Notes: 11/10/21: No new pressure injuries. Pressure relief ongoing. 12/22/21: Pressure reduction ongoing, has Group 3 mattress. Wound/Skin Impairment Nursing Diagnoses: Impaired tissue integrity Goals: Patient/caregiver will verbalize understanding of skin care regimen Date Initiated: 09/08/2021 Target Resolution Date: 01/12/2022 Goal Status: Active Ulcer/skin breakdown will have a volume reduction of 30% by week 4 Date Initiated: 09/08/2021 Date Inactivated: 10/14/2021 Target Resolution Date: 10/06/2021 Goal Status: Met Ulcer/skin breakdown will have a volume reduction of 50% by week 8 Date Initiated: 10/14/2021 Date Inactivated: 11/10/2021 Target Resolution Date: 11/10/2021 Goal Status: Met Ulcer/skin breakdown will have a volume reduction of 80% by week 12 Date Initiated: 11/10/2021 Target Resolution Date: 01/12/2022 Goal Status:  Active Interventions: Assess patient/caregiver ability to obtain necessary supplies Assess patient/caregiver ability to perform ulcer/skin care regimen upon admission and as needed Assess ulceration(s) every visit Provide education on ulcer and skin care Treatment Activities: Topical wound management initiated : 09/08/2021 Notes: 11/10/21: Wound care regimen continues, patient has family and HH doing dressing changes. Electronic Signature(s) Signed: 12/22/2021 10:40:35 AM By: Barnhart, Jodi Previous Signature: 12/22/2021 10:39:58 AM Version By: Barnhart, Jodi Entered By: Barnhart, Jodi on 12/22/2021 10:40:34 -------------------------------------------------------------------------------- Pain Assessment Details Patient Name: Date of Service: Jocelyn ES, [name] 12/22/2021 10:15 A M Medical Record Number: [id number] Patient Account Number: [id number] Date of Birth/Sex: Treating RN: 07/04/1955 (66 y.o. F) Breedlove, [name] Primary Care Nalla Purdy: Kadakia, Ajay S Other Clinician: Referring Naly Schwanz: Treating Garek Schuneman/Extender: Hoffman, Jessica Kadakia, Ajay S   Weeks in Treatment: 15 Active Problems Location of Pain Severity and Description of Pain Patient Has Paino Yes Site Locations Pain Location: Generalized Pain, Pain in Ulcers With Dressing Change: Yes Duration of the Pain. Constant / Intermittento Constant Rate the pain. Current Pain Level: 4 Worst Pain Level: 10 Least Pain Level: 0 Tolerable Pain Level: 4 Character of Pain Describe the Pain: Aching Pain Management and Medication Current Pain Management: Medication: No Cold Application: No Rest: No Massage: No Activity: No T.E.N.S.: No Heat Application: No Leg drop or elevation: No Is the Current Pain Management Adequate: Adequate How does your wound impact your activities of daily livingo Sleep: No Bathing: No Appetite: No Relationship With Others: No Bladder Continence: No Emotions: No Bowel Continence:  No Work: No Toileting: No Drive: No Dressing: No Hobbies: No Electronic Signature(s) Signed: 12/22/2021 4:05:15 PM By: Breedlove, [name] RN Entered By: Breedlove, [name] on 12/22/2021 10:20:48 -------------------------------------------------------------------------------- Patient/Caregiver Education Details Patient Name: Date of Service: Jocelyn ES, [name] 7/6/2023andnbsp10:15 A M Medical Record Number: [id number] Patient Account Number: [id number] Date of Birth/Gender: Treating RN: 10/02/1955 (66 y.o. F) Barnhart, Jodi Primary Care Physician: Kadakia, Ajay S Other Clinician: Referring Physician: Treating Physician/Extender: Hoffman, Jessica Kadakia, Ajay S Weeks in Treatment: 15 Education Assessment Education Provided To: Patient Education Topics Provided Pressure: Methods: Explain/Verbal, Printed Responses: State content correctly Wound/Skin Impairment: Methods: Explain/Verbal, Printed Responses: State content correctly Electronic Signature(s) Signed: 12/22/2021 4:14:47 PM By: Barnhart, Jodi Entered By: Barnhart, Jodi on 12/22/2021 10:40:53 -------------------------------------------------------------------------------- Wound Assessment Details Patient Name: Date of Service: Jocelyn ES, [name] 12/22/2021 10:15 A M Medical Record Number: [id number] Patient Account Number: [id number] Date of Birth/Sex: Treating RN: 07/13/1955 (66 y.o. F) Breedlove, [name] Primary Care : Kadakia, Ajay S Other Clinician: Referring : Treating /Extender: Hoffman, Jessica Kadakia, Ajay S Weeks in Treatment: 15 Wound Status Wound Number: 6 Primary Pressure Ulcer Etiology: Wound Location: Sacrum Wound Open Wounding Event: Pressure Injury Status: Date Acquired: 07/20/2020 Comorbid Anemia, Congestive Heart Failure, Deep Vein Thrombosis, Weeks Of Treatment: 15 History: Hypertension, Peripheral Venous Disease, Type II Diabetes Clustered Wound: No Photos Wound  Measurements Length: (cm) 3 Width: (cm) 2.5 Depth: (cm) 1 Area: (cm) 5.89 Volume: (cm) 5.89 % Reduction in Area: -13.6% % Reduction in Volume: -62.3% Epithelialization: Medium (34-66%) Tunneling: No Undermining: Yes Starting Position (o'clock): 12 Ending Position (o'clock): 12 Maximum Distance: (cm) 3 Wound Description Classification: Category/Stage III Wound Margin: Well defined, not attached Exudate Amount: Medium Exudate Type: Serosanguineous Exudate Color: red, brown Foul Odor After Cleansing: No Slough/Fibrino No Wound Bed Granulation Amount: Large (67-100%) Exposed Structure Granulation Quality: Red, Pink Fascia Exposed: No Necrotic Amount: None Present (0%) Fat Layer (Subcutaneous Tissue) Exposed: Yes Tendon Exposed: No Muscle Exposed: No Joint Exposed: No Bone Exposed: No Treatment Notes Wound #6 (Sacrum) Cleanser Soap and Water Discharge Instruction: May shower and wash wound with dial antibacterial soap and water prior to dressing change. Wound Cleanser Discharge Instruction: Cleanse the wound with wound cleanser prior to applying a clean dressing using gauze sponges, not tissue or cotton balls. Peri-Wound Care Barrier Cream of Choice Topical Primary Dressing KerraCel Ag Gelling Fiber Dressing, 4x5 in (silver alginate) Discharge Instruction: Apply silver alginate to wound bed as instructed Secondary Dressing Woven Gauze Sponge, Non-Sterile 4x4 in Discharge Instruction: Apply over primary dressing as directed. Zetuvit Plus Silicone Border Dressing 5x5 (in/in) Discharge Instruction: Or equivalent foam border (pt prefers square dressing) Secured With Compression Wrap Compression Stockings Add-Ons Electronic Signature(s) Signed: 12/22/2021 4:05:15 PM By: Breedlove,   [name] RN Signed: 12/22/2021 4:18:35 PM By: Deon Pilling RN, BSN Entered By: Deon Pilling on 12/22/2021  10:29:18 -------------------------------------------------------------------------------- Vitals Details Patient Name: Date of Service: Jocelyn Sanchez, Jocelyn Spruce [name] 12/22/2021 10:15 A M Medical Record Number: [id number] Patient Account Number: [id number] Date of Birth/Sex: Treating RN: 05/13/1956 (66 y.o. [name], [name] Primary Care Savreen Gebhardt: [name] Other Clinician: Referring Briggette Najarian: Treating Lakyla Biswas/Extender: Dimitri Ped Weeks in Treatment: 15 Vital Signs Time Taken: 10:20 Temperature (F): 97.8 Pulse (bpm): 87 Respiratory Rate (breaths/min): 17 Blood Pressure (mmHg): 116/72 Reference Range: 80 - 120 mg / dl Electronic Signature(s) Signed: 12/22/2021 4:14:47 PM By: Lorrin Jackson Entered By: Lorrin Jackson on 12/22/2021 10:49:52

## 2022-02-23 ENCOUNTER — Ambulatory Visit (HOSPITAL_BASED_OUTPATIENT_CLINIC_OR_DEPARTMENT_OTHER): Payer: Medicare Other | Admitting: Internal Medicine

## 2022-03-20 ENCOUNTER — Encounter (HOSPITAL_BASED_OUTPATIENT_CLINIC_OR_DEPARTMENT_OTHER): Payer: Medicare Other | Admitting: Internal Medicine

## 2022-03-27 ENCOUNTER — Encounter (HOSPITAL_BASED_OUTPATIENT_CLINIC_OR_DEPARTMENT_OTHER): Payer: Medicare Other | Attending: Internal Medicine | Admitting: Internal Medicine

## 2022-03-27 DIAGNOSIS — M4628 Osteomyelitis of vertebra, sacral and sacrococcygeal region: Secondary | ICD-10-CM | POA: Insufficient documentation

## 2022-03-27 DIAGNOSIS — I110 Hypertensive heart disease with heart failure: Secondary | ICD-10-CM | POA: Insufficient documentation

## 2022-03-27 DIAGNOSIS — L89154 Pressure ulcer of sacral region, stage 4: Secondary | ICD-10-CM | POA: Diagnosis not present

## 2022-03-27 DIAGNOSIS — I890 Lymphedema, not elsewhere classified: Secondary | ICD-10-CM | POA: Diagnosis not present

## 2022-03-27 DIAGNOSIS — I509 Heart failure, unspecified: Secondary | ICD-10-CM | POA: Insufficient documentation

## 2022-03-27 DIAGNOSIS — E1151 Type 2 diabetes mellitus with diabetic peripheral angiopathy without gangrene: Secondary | ICD-10-CM | POA: Diagnosis not present

## 2022-03-27 DIAGNOSIS — G35 Multiple sclerosis: Secondary | ICD-10-CM | POA: Diagnosis not present

## 2022-03-27 DIAGNOSIS — L89153 Pressure ulcer of sacral region, stage 3: Secondary | ICD-10-CM | POA: Diagnosis present

## 2022-03-27 NOTE — Progress Notes (Addendum)
ZYARA, RILING ([id number]) [id number] Page 1 of 7 Visit Report for 03/27/2022 Arrival Information Details Patient Name: Date of Service: Jocelyn Sanchez [name] 03/27/2022 10:15 A M Medical Record Number: [id number] Patient Account Number: [id number] Date of Birth/Sex: Treating RN: 09-30-1955 (66 y.o. [name] Primary Care Kimiah Hibner: [name] Other Clinician: Referring Carla Whilden: Treating Eleanora Guinyard/Extender: Dimitri Ped Weeks in Treatment: 28 Visit Information History Since Last Visit Added or deleted any medications: No Patient Arrived: Stretcher Any new allergies or adverse reactions: No Arrival Time: 10:13 Had a fall or experienced change in No Transfer Assistance: Stretcher activities of daily living that may affect Patient Identification Verified: Yes risk of falls: Secondary Verification Process Completed: Yes Signs or symptoms of abuse/neglect since last visito No Patient Requires Transmission-Based Precautions: No Hospitalized since last visit: No Patient Has Alerts: No Implantable device outside of the clinic excluding No cellular tissue based products placed in the center since last visit: Has Dressing in Place as Prescribed: Yes Pain Present Now: No Electronic Signature(s) Signed: 03/27/2022 4:35:51 PM By: Lorrin Jackson Entered By: Lorrin Jackson on 03/27/2022 10:14:20 -------------------------------------------------------------------------------- Clinic Level of Care Assessment Details Patient Name: Date of Service: Jocelyn Sanchez [name] 03/27/2022 10:15 A M Medical Record Number: [id number] Patient Account Number: [id number] Date of Birth/Sex: Treating RN: 16-Mar-1956 (66 y.o. [name] Primary Care Meyer Dockery: [name] Other Clinician: Referring Dequincy Born: Treating Brace Welte/Extender: Dimitri Ped Weeks in Treatment: 28 Clinic Level of Care Assessment Items TOOL 4  Quantity Score X- 1 0 Use when only an EandM is performed on FOLLOW-UP visit ASSESSMENTS - Nursing Assessment / Reassessment X- 1 10 Reassessment of Co-morbidities (includes updates in patient status) X- 1 5 Reassessment of Adherence to Treatment Plan ASSESSMENTS - Wound and Skin A ssessment / Reassessment X - Simple Wound Assessment / Reassessment - one wound 1 5 '[]'$  - 0 Complex Wound Assessment / Reassessment - multiple wounds '[]'$  - 0 Dermatologic / Skin Assessment (not related to wound area) ASSESSMENTS - Focused Assessment '[]'$  - 0 Circumferential Edema Measurements - multi extremities '[]'$  - 0 Nutritional Assessment / Counseling / Intervention KAYLIEE, ATIENZA ([id number]) [id number] Page 2 of 7 '[]'$  - 0 Lower Extremity Assessment (monofilament, tuning fork, pulses) '[]'$  - 0 Peripheral Arterial Disease Assessment (using hand held doppler) ASSESSMENTS - Ostomy and/or Continence Assessment and Care '[]'$  - 0 Incontinence Assessment and Management '[]'$  - 0 Ostomy Care Assessment and Management (repouching, etc.) PROCESS - Coordination of Care '[]'$  - 0 Simple Patient / Family Education for ongoing care X- 1 20 Complex (extensive) Patient / Family Education for ongoing care '[]'$  - 0 Staff obtains Programmer, systems, Records, T Results / Process Orders est X- 1 10 Staff telephones HHA, Nursing Homes / Clarify orders / etc '[]'$  - 0 Routine Transfer to another Facility (non-emergent condition) '[]'$  - 0 Routine Hospital Admission (non-emergent condition) '[]'$  - 0 New Admissions / Biomedical engineer / Ordering NPWT Apligraf, etc. , '[]'$  - 0 Emergency Hospital Admission (emergent condition) '[]'$  - 0 Simple Discharge Coordination '[]'$  - 0 Complex (extensive) Discharge Coordination PROCESS - Special Needs '[]'$  - 0 Pediatric / Minor Patient Management '[]'$  - 0 Isolation Patient Management '[]'$  - 0 Hearing / Language / Visual special needs '[]'$  - 0 Assessment of Community assistance  (transportation, D/C planning, etc.) '[]'$  - 0 Additional assistance / Altered mentation '[]'$  - 0 Support Surface(s) Assessment (bed, cushion, seat, etc.) INTERVENTIONS - Wound Cleansing / Measurement X -  Simple Wound Cleansing - one wound 1 5 '[]'$  - 0 Complex Wound Cleansing - multiple wounds X- 1 5 Wound Imaging (photographs - any number of wounds) '[]'$  - 0 Wound Tracing (instead of photographs) X- 1 5 Simple Wound Measurement - one wound '[]'$  - 0 Complex Wound Measurement - multiple wounds INTERVENTIONS - Wound Dressings '[]'$  - 0 Small Wound Dressing one or multiple wounds X- 1 15 Medium Wound Dressing one or multiple wounds '[]'$  - 0 Large Wound Dressing one or multiple wounds '[]'$  - 0 Application of Medications - topical '[]'$  - 0 Application of Medications - injection INTERVENTIONS - Miscellaneous '[]'$  - 0 External ear exam '[]'$  - 0 Specimen Collection (cultures, biopsies, blood, body fluids, etc.) '[]'$  - 0 Specimen(s) / Culture(s) sent or taken to Lab for analysis '[]'$  - 0 Patient Transfer (multiple staff / Civil Service fast streamer / Similar devices) '[]'$  - 0 Simple Staple / Suture removal (25 or less) '[]'$  - 0 Complex Staple / Suture removal (26 or more) '[]'$  - 0 Hypo / Hyperglycemic Management (close monitor of Blood Glucose) Jagiello, Zandrea D ([id number]) [id number] Page 3 of 7 '[]'$  - 0 Ankle / Brachial Index (ABI) - do not check if billed separately X- 1 5 Vital Signs Has the patient been seen at the hospital within the last three years: Yes Total Score: 85 Level Of Care: New/Established - Level 3 Electronic Signature(s) Signed: 03/27/2022 4:35:51 PM By: Lorrin Jackson Entered By: Lorrin Jackson on 03/27/2022 10:56:25 -------------------------------------------------------------------------------- Encounter Discharge Information Details Patient Name: Date of Service: Jocelyn Sanchez [name] 03/27/2022 10:15 A M Medical Record Number: [id number] Patient Account Number:  [id number] Date of Birth/Sex: Treating RN: 09-02-1955 (66 y.o. [name] Primary Care Curby Carswell: [name] Other Clinician: Referring Josue Kass: Treating Calandria Mullings/Extender: Dimitri Ped Weeks in Treatment: 57 Encounter Discharge Information Items Discharge Condition: Stable Ambulatory Status: Stretcher Discharge Destination: Home Transportation: Ambulance Schedule Follow-up Appointment: No Clinical Summary of Care: Provided on 03/28/2022 Form Type Recipient Paper Patient Patient Electronic Signature(s) Signed: 03/27/2022 4:35:51 PM By: Lorrin Jackson Entered By: Lorrin Jackson on 03/27/2022 11:04:39 -------------------------------------------------------------------------------- Lower Extremity Assessment Details Patient Name: Date of Service: Jocelyn Sanchez [name] 03/27/2022 10:15 A M Medical Record Number: [id number] Patient Account Number: [id number] Date of Birth/Sex: Treating RN: [birth year] (66 y.o. [name] Primary Care Maddilyn Campus: [name] Other Clinician: Referring Dedria Endres: Treating Shivam Mestas/Extender: Dimitri Ped Weeks in Treatment: 28 Electronic Signature(s) Signed: 03/27/2022 4:35:51 PM By: Lorrin Jackson Entered By: Lorrin Jackson on 03/27/2022 10:21:32 -------------------------------------------------------------------------------- Multi Wound Chart Details Patient Name: Date of Service: Ledora Bottcher, Lindie Spruce [name] 03/27/2022 10:15 A Judi Saa, Sallyanne Havers D ([id number]) [id number] Page 4 of 7 Medical Record Number: [id number] Patient Account Number: [id number] Date of Birth/Sex: Treating RN: 01/15/1956 (66 y.o. F) Primary Care Raeann Offner: [name] Other Clinician: Referring Kharter Sestak: Treating Weltha Cathy/Extender: Dimitri Ped Weeks in Treatment: 28 Vital Signs Height(in): Pulse(bpm): 90 Weight(lbs): Blood Pressure(mmHg): 98/62 Body Mass  Index(BMI): Temperature(F): 98.1 Respiratory Rate(breaths/min): 16 [6:Photos:] [N/A:N/A] Sacrum N/A N/A Wound Location: Pressure Injury N/A N/A Wounding Event: Pressure Ulcer N/A N/A Primary Etiology: Anemia, Congestive Heart Failure, N/A N/A Comorbid History: Deep Vein Thrombosis, Hypertension, Peripheral Venous Disease, Type II Diabetes 07/20/2020 N/A N/A Date Acquired: 28 N/A N/A Weeks of Treatment: Open N/A N/A Wound Status: No N/A N/A Wound Recurrence: 3.2x2.6x1.5 N/A N/A Measurements L x W x D (cm) 6.535 N/A N/A A (cm) : rea 9.802 N/A  N/A Volume (cm) : -26.10% N/A N/A % Reduction in A rea: -170.10% N/A N/A % Reduction in Volume: 9 Starting Position 1 (o'clock): 12 Ending Position 1 (o'clock): 3 Maximum Distance 1 (cm): Yes N/A N/A Undermining: Category/Stage III N/A N/A Classification: Medium N/A N/A Exudate A mount: Serosanguineous N/A N/A Exudate Type: red, brown N/A N/A Exudate Color: Well defined, not attached N/A N/A Wound Margin: Large (67-100%) N/A N/A Granulation A mount: Red, Pink N/A N/A Granulation Quality: Small (1-33%) N/A N/A Necrotic A mount: Fat Layer (Subcutaneous Tissue): Yes N/A N/A Exposed Structures: Fascia: No Tendon: No Muscle: No Joint: No Bone: No Medium (34-66%) N/A N/A Epithelialization: No Abnormalities Noted N/A N/A Periwound Skin Texture: No Abnormalities Noted N/A N/A Periwound Skin Moisture: No Abnormalities Noted N/A N/A Periwound Skin Color: No Abnormality N/A N/A Temperature: Treatment Notes Electronic Signature(s) Signed: 03/27/2022 12:26:27 PM By: [name] DO Entered By: [name] on 03/27/2022 10:59:48 Erskin, Sallyanne Havers D ([id number]) [id number] Page 5 of 7 -------------------------------------------------------------------------------- Multi-Disciplinary Care Plan Details Patient Name: Date of Service: Jocelyn Sanchez [name] 03/27/2022 10:15 A  M Medical Record Number: [id number] Patient Account Number: [id number] Date of Birth/Sex: Treating RN: June 21, 1955 (66 y.o. [name] Primary Care Syla Devoss: [name] Other Clinician: Referring Lakeya Mulka: Treating Kemia Wendel/Extender: Dimitri Ped Weeks in Treatment: 31 Active Inactive Electronic Signature(s) Signed: 04/10/2022 5:53:51 PM By: Deon Pilling RN, BSN Signed: 04/14/2022 1:21:31 PM By: Lorrin Jackson Previous Signature: 03/27/2022 4:35:51 PM Version By: Lorrin Jackson Entered By: Deon Pilling on 04/10/2022 17:53:51 -------------------------------------------------------------------------------- Pain Assessment Details Patient Name: Date of Service: Jocelyn Sanchez [name] 03/27/2022 10:15 A M Medical Record Number: [id number] Patient Account Number: [id number] Date of Birth/Sex: Treating RN: 05/25/1956 (66 y.o. [name] Primary Care Domnique Vantine: [name] Other Clinician: Referring Karen Kinnard: Treating Tyrianna Lightle/Extender: Dimitri Ped Weeks in Treatment: 28 Active Problems Location of Pain Severity and Description of Pain Patient Has Paino No Site Locations Pain Management and Medication Current Pain Management: Electronic Signature(s) Signed: 03/27/2022 4:35:51 PM By: Lorrin Jackson Entered By: Lorrin Jackson on 03/27/2022 10:21:22 Mccarthy, Sallyanne Havers D ([id number]) [id number] Page 6 of 7 -------------------------------------------------------------------------------- Patient/Caregiver Education Details Patient Name: Date of Service: Jocelyn Sanchez [name] 10/9/2023andnbsp10:15 [place] Record Number: [id number] Patient Account Number: [id number] Date of Birth/Gender: Treating RN: Sep 16, 1955 (66 y.o. [name] Primary Care Physician: [name] Other Clinician: Referring Physician: Treating Physician/Extender: Marla Roe in Treatment:  24 Education Assessment Education Provided To: Patient Education Topics Provided Pressure: Methods: Explain/Verbal, Printed Responses: State content correctly Wound/Skin Impairment: Methods: Explain/Verbal, Printed Responses: State content correctly Electronic Signature(s) Signed: 03/27/2022 4:35:51 PM By: Lorrin Jackson Entered By: Lorrin Jackson on 03/27/2022 10:13:53 -------------------------------------------------------------------------------- Wound Assessment Details Patient Name: Date of Service: Jocelyn Sanchez [name] 03/27/2022 10:15 A M Medical Record Number: [id number] Patient Account Number: [id number] Date of Birth/Sex: Treating RN: 10/14/1955 (66 y.o. [name] Primary Care Alisabeth Selkirk: [name] Other Clinician: Referring Katharine Rochefort: Treating Horice Carrero/Extender: Dimitri Ped Weeks in Treatment: 28 Wound Status Wound Number: 6 Primary Pressure Ulcer Etiology: Wound Location: Sacrum Wound Open Wounding Event: Pressure Injury Status: Date Acquired: 07/20/2020 Comorbid Anemia, Congestive Heart Failure, Deep Vein Thrombosis, Weeks Of Treatment: 28 History: Hypertension, Peripheral Venous Disease, Type II Diabetes Clustered Wound: No Photos REDINA, ZELLER ([id number]) [id number] Page 7 of 7 Wound Measurements Length: (cm) 3.2 Width: (cm) 2.6 Depth: (cm) 1.5 Area: (  cm) 6.535 Volume: (cm) 9.802 % Reduction in Area: -26.1% % Reduction in Volume: -170.1% Epithelialization: Medium (34-66%) Tunneling: No Undermining: Yes Starting Position (o'clock): 9 Ending Position (o'clock): 12 Maximum Distance: (cm) 3 Wound Description Classification: Category/Stage IV Wound Margin: Well defined, not attached Exudate Amount: Medium Exudate Type: Serosanguineous Exudate Color: red, brown Foul Odor After Cleansing: No Slough/Fibrino Yes Wound Bed Granulation Amount: Large (67-100%) Exposed Structure Granulation  Quality: Red, Pink Fascia Exposed: No Necrotic Amount: Small (1-33%) Fat Layer (Subcutaneous Tissue) Exposed: Yes Necrotic Quality: Adherent Slough Tendon Exposed: No Muscle Exposed: No Joint Exposed: No Bone Exposed: Yes Periwound Skin Texture Texture Color No Abnormalities Noted: Yes No Abnormalities Noted: Yes Moisture Temperature / Pain No Abnormalities Noted: Yes Temperature: No Abnormality Electronic Signature(s) Signed: 03/27/2022 4:35:51 PM By: Lorrin Jackson Entered By: Lorrin Jackson on 03/27/2022 11:05:05 -------------------------------------------------------------------------------- Vitals Details Patient Name: Date of Service: Jocelyn Sanchez [name] 03/27/2022 10:15 A M Medical Record Number: [id number] Patient Account Number: [id number] Date of Birth/Sex: Treating RN: May 14, 1956 (66 y.o. [name] Primary Care Jaskarn Schweer: [name] Other Clinician: Referring Ailani Governale: Treating Shawnte Winton/Extender: Dimitri Ped Weeks in Treatment: 28 Vital Signs Time Taken: 10:20 Temperature (F): 98.1 Pulse (bpm): 82 Respiratory Rate (breaths/min): 16 Blood Pressure (mmHg): 98/62 Reference Range: 80 - 120 mg / dl Electronic Signature(s) Signed: 03/27/2022 4:35:51 PM By: Lorrin Jackson Entered By: Lorrin Jackson on 03/27/2022 10:21:15

## 2022-03-27 NOTE — Progress Notes (Signed)
Jocelyn Sanchez ([id number]) [id number] Page 1 of 9 Visit Report for 03/27/2022 Chief Complaint Document Details Patient Name: Date of Service: Jocelyn Sanchez RNA Sanchez. 03/27/2022 10:15 A M Medical Record Number: [id number] Patient Account Number: [id number] Date of Birth/Sex: Treating RN: 1955/10/13 (66 y.o. F) Primary Care Provider: Birdie Sanchez Other Clinician: Referring Provider: Treating Provider/Extender: Jocelyn Sanchez Weeks in Treatment: 28 Information Obtained from: Patient Chief Complaint evaluation for sacral pressure ulcer 10/19/2020; patient is here for review of a sacral pressure ulcer which I think is in the same position as last time 3/23/2023patient presents for review of sacral pressure ulcer that has not healed in 6 years. She has been followed in our clinic for the past 6 years for this issue. She was last seen 05/2021 Electronic Signature(s) Signed: 03/27/2022 12:26:27 PM By: Jocelyn Shan DO Entered By: Jocelyn Sanchez on 03/27/2022 11:00:06 -------------------------------------------------------------------------------- HPI Details Patient Name: Date of Service: Jocelyn Sanchez RNA Sanchez. 03/27/2022 10:15 A M Medical Record Number: [id number] Patient Account Number: [id number] Date of Birth/Sex: Treating RN: 1956-01-30 (66 y.o. F) Primary Care Provider: Birdie Sanchez Other Clinician: Referring Provider: Treating Provider/Extender: Jocelyn Sanchez Weeks in Treatment: 54 History of Present Illness Location: Midline sacral region Quality: Patient describes this as a soreness Severity: 2 out of 10 Duration: Greater than one year prior to presentation to the wound center Timing: Pain occurs most often with dressing changes Context: Developmental result of pressure over time to the sacral region Modifying Factors: Patient has previously had this wound packed with gauze, she has had a wound VAC, and she  tells me other various wound care treatments over the past year. ssociated Signs and Symptoms: Multiple sclerosis, hypertension, diabetes mellitus type 2, lymphedema A HPI Description: 03/27/16 patient presents today on initial evaluation concerning her sacral pressure injury which has been present for roughly one year. She tells me that she does not know of any x-rays or other advanced imaging has been performed in regard to the sacral region. She has been provided with wound care at the [facility] facility at this point in time. She has MS and is not able to move she tells me "at all". She does tell me however that the facility is very good about re-positioning her at least every 2 hours. With that being said she did fell the wound VAC apparently. She had this twice the first time she tells me that it did not seem to be working very well and the second time it actually made things worse this was just 2 months ago. Therefore this was discontinued. at this point in time today she is not having any signs or symptoms of systemic infection she is on a blood thinner which is Eliquis at this point in time. 03/24/16; patient admitted to our clinic by Jocelyn Sanchez last week. She is a lady with advanced MS who is had a wound on her lower sacral area for most of the last year. She reiterates to me that wound vacs have not worked for her in fact she states they've actually made her wound worse on the last occasion. An x- ray was apparently done in the facility although I don't have these results. 03/31/16; this patient had an x-ray done at the facility that did not show evidence of osteomyelitis. Her bone biopsy did not show osteomyelitis. 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 She had been suggested to have  collagen on the base of the wound covered with normal saline wet to dry although her intake nurse reports there was no collagen on the wound surface. 05/15/16 wound is not changed that much. This is a substantial stage IV wound with considerable undermining from 7 to 3:00. I have spoken to the wound care nurse at the [facility] facility, there was no records on what exactly caused the wound VAC to fail or records of the same. 06/05/16; I received communication from [facility] skilled facility reporting that the patient refuses the wound back more often that it's on. When I confronted her with this today in the room she denied that she was responsible stating for the most part the problem is because she has trouble getting them to Jocelyn Sanchez ([id number]) [id number] Page 2 of 9 deal with her Foley catheter properly. I don't exactly follow her line of reasoning here however apparently they've been putting hydrogel wet to dry and in the facility 07/03/16; I have not heard anything from [place]. The patient states she has not had a wound VAC the last 2-3 days due to "battery problems". She arrives today with wet to dry dressing. She has advanced MS and is a chronic care resident at the skilled facility 07/31/16; patient is a resident of [facility] skilled facility. She has had the VAC applied but did not arrive with the [facility] in place. Her wound generally looks better perhaps somewhat less tunneling superiorly. There is still some exposed bone however. 09/11/16; I follow this patient with advanced MS who is a resident of [facility] skilled facility on a monthly basis. She arrives 6 weeks after her last visit. The wound VAC apparently is no longer being used according to the patient because the nurses in the facility could not maintain a seal in this area. Via the patient it seems that they're using some form of wet to dry dressing although he received no documentation. The  patient states she is eating well she is being turned in the facility. 10/09/16; I follow this patient with advanced MS who is a resident of [facility]. She apparently could not have a wound VAC is the facility could not maintain a seal therefore they're using wet-to-dry dressings however the patient arrives today with better measurements and a nicely granulated wound bed. 11/05/16; patient with advanced MS was a pressure area on the lower sacrum/coccyx. She could not have a wound VAC because the facility could n/ot maintain a seal. I ordered Prisma last time. Wound dimensions are about the same per intake 12/04/16; pressure area on the lower sacrum. using collagen. Has a new concern on the right great toe 01/01/17; patient has now only a small open area on the lower sacrumcoccyx she is been doing well and this wound is gradually progressing towards closure. The area on the right great toe that looked to me like a paronychia last time is also quite a bit better although the nail here looks as though it's loosening READMISSION 10/19/2020 This is a patient with advanced multiple sclerosis. We had her in clinic for a prolonged period in late 2017 to the summer 2018 with a stage IV wound on her lower sacrum/coccyx. I do not think we healed her out although her wound was a lot better. She was a resident at [facility] skilled facility at the time The  patient states the wound reopened about 3 or 4 months ago. I am not completely certain from talking to the patient if this is in the same position as last time or even indeed if it ever closed. 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 She has a home health aide who comes twice a day. She had an air mattress but apparently it does not work As far as I can tell there is not been any major change in her medical problem list except she lives at home now. She  is a diabetic on metformin 6/23; patient has not been here in about 6-7 weeks. I am not sure what the issue is and the patient does not seem to really know. She takes scat for transportation although that has not been a problem before. We changed her dressing last time she was here to silver collagen moistened with the backing wet- to-dry and a foam border. Her wound is quite a bit better looking. 7/14; 3-week follow-up. The patient's wound is not as good as last time. It is wider I think more undermining superiorly. Also there appears to be superficial skin breakdown superiorly and the skin around the wound and also from about 2-4 o'clock. Not a particularly viable surface. We have been using silver collagen and backing wet-to-dry. The patient lives in her own home. She apparently has care attendance for almost all of the day. She has a surface on her bed but this is clearly not a level 3. She also has home health. She claims to be eating well 8/4; patient presents for 3-week follow-up. She has no issues or complaints today. She reports using wet-to-dry dressings for her sacral wound and collagen to the buttocks wound. She states that her new mattress is arriving today 9/29; its been almost 2 months since the patient was in the clinic. She has a sacral wound which is worse I think with exposed bone to the right under the undermining area. Also concerning is a marked deterioration of her left buttock. With a superficial necrotic area and several small superficial areas. She has been using silver collagen wet-to-dry. The patient has a level 2 surface certainly would benefit from a level 3 if that can be provided. She is relatively immobile cannot turn her self secondary to multiple sclerosis. She tells me she has an aide for most of the day except for 4 hours. She also states she is not up much in a wheelchair. I wonder about her oral intake although she says she eats 3 meals a day and takes  Glucerna 10/13; the patient looks somewhat better than 2 weeks ago today. I gave her empiric Augmentin which she took a mobile x-ray was negative for osteomyelitis but they suggested a bone scan. The wound area actually looks somewhat better today 10/27; comes in today with exposed bone complaints of more pain and odor. She looks as though she is losing weight although she claims to be eating well. She takes Ensure 11/16; patient came in 2 weeks ago with exposed bone. I did a biopsy this was negative for osteomyelitis. Bone culture was also negative showing multiple organisms. I believe I did give her Augmentin which gave her some diarrhea but she does not need further antibiotic. We are using silver alginate to this wound and also the closely position superficial area on the skin  just above the wound. She managed to get a level 3 surface that we had ordered she says she does not like it because it is hot and noisy. But nevertheless she is using a 12/14; 1 month follow-up. The patient has 2 wounds her original deep wound on the sacrum which initially had exposed bone. This was cultured and surprisingly negative. She also superiorly had a more superficial but not a healthy looking wound above it. We are using silver alginate on both wounds. Things look improved today. She has completed her antibiotics. Given the negative culture and biopsy of the bone here I do not think any more systemic antibiotics are indicated 09/08/2021 Jocelyn Sanchez Is a 66 year old female with a past medical history of multiple sclerosis that presents to the clinic for continued follow-up of her sacral ulcer. She has no issues or complaints today. She states she was recommended to continue following up in our clinic by her home health nurse specifically to have orders for barrier cream. Patient also complains about her current bed and this is a sand mattress. She would like an air mattress. She has no complaints about the  wound today. She denies systemic signs of infection. 4/28; 5-week follow-up. Wound is measuring smaller she has been using silver alginate she has home health coming once a week and her son is changing it the rest of the days. She did not have the x-ray that was ordered last time apparently this was to be done through a mobile X type company 5/25; patient presents for follow-up. She was switched to collagen at last clinic visit. She has no issues or complaints today. She was not able to obtain her x- ray. She would like another order for this as she lost the previous one. 7/6; patient presents for follow-up. She had her sacral x-ray completed that showed erosion of the sacrum and coccyx but appears very similar to 2021 in 2019 imaging. No new erosions seen. Patient has been using [facility]stone antibiotic based on results of a PCR culture that showed high levels of Enterococcus faecalis. She states that the ointment is irritating her. She would like to go back to a different dressing. 10/9; patient presents for follow-up. She has been using silver alginate to the wound bed. Unfortunately she cannot offload this area and lays in bed all day. She denies systemic signs of infection. Electronic Signature(s) Signed: 03/27/2022 12:26:27 PM By: Jocelyn Shan DO Entered By: Jocelyn Sanchez on 03/27/2022 11:02:09 Saintjean, Sallyanne Havers Sanchez ([id number]) [id number] Page 3 of 9 -------------------------------------------------------------------------------- Physical Exam Details Patient Name: Date of Service: Jocelyn Sanchez RNA Sanchez. 03/27/2022 10:15 A M Medical Record Number: [id number] Patient Account Number: [id number] Date of Birth/Sex: Treating RN: [birth year] (66 y.o. F) Primary Care Provider: Birdie Sanchez Other Clinician: Referring Provider: Treating Provider/Extender: Jocelyn Sanchez Weeks in Treatment: 28 Constitutional respirations regular, non-labored and within  target range for patient.Marland Kitchen Psychiatric pleasant and cooperative. Notes Sacrum: Open wound with pale granulation tissue and small area of exposed bone. No signs of surrounding infection. Electronic Signature(s) Signed: 03/27/2022 12:26:27 PM By: Jocelyn Shan DO Entered By: Jocelyn Sanchez on 03/27/2022 11:05:01 -------------------------------------------------------------------------------- Physician Orders Details Patient Name: Date of Service: Jocelyn Sanchez RNA Sanchez. 03/27/2022 10:15 A M Medical Record Number: [id number] Patient Account Number: [id number] Date of Birth/Sex: Treating RN: [birth year] (66 y.o. [name] Primary Care Provider: Birdie Sanchez Other Clinician: Referring Provider: Treating Provider/Extender: Jocelyn Sanchez Weeks in Treatment: 87  Verbal / Phone Orders: No Diagnosis Coding ICD-10 Coding Code Description L89.153 Pressure ulcer of sacral region, stage 3 G35 Multiple sclerosis M46.28 Osteomyelitis of vertebra, sacral and sacrococcygeal region Follow-up Appointments ppointment in: - Patient is going to try and have PCP manage wound orders for Home Health. She will call for appt if not able to get that Return A arranged with PCP **Stretcher** Anesthetic (In clinic) Topical Lidocaine 5% applied to wound bed (In clinic) Topical Lidocaine 4% applied to wound bed Off-Loading A fluidized (Group 3) mattress ir Turn and reposition every 2 hours Additional Orders / Instructions Follow [facility] wound care orders this week; continue Home Health for wound care. May utilize formulary equivalent dressing for wound treatment orders unless otherwise specified. - Discontinue Silver Alginate and start Dakin's wet to dry. Other Home Health Orders/Instructions: - Medi HH: 2-3x per week TOVAH, SLAVICK ([id number]) [id number] Page 4 of 9 Wound Treatment Wound #6 - Sacrum Cleanser: Soap and  Water (Home Health) 1 x Per Day/30 Days Discharge Instructions: May shower and wash wound with dial antibacterial soap and water prior to dressing change. Cleanser: Wound Cleanser (Home Health) 1 x Per Day/30 Days Discharge Instructions: Cleanse the wound with wound cleanser prior to applying a clean dressing using gauze sponges, not tissue or cotton balls. Peri-Wound Care: Barrier Cream of Choice (Home Health) 1 x Per Day/30 Days Prim Dressing: Dakin's Solution 0.25%, 16 (oz) 1 x Per Day/30 Days ary Discharge Instructions: Moisten gauze with Dakin's solution Secondary Dressing: Woven Gauze Sponge, Non-Sterile 4x4 in (Home Health) 1 x Per Day/30 Days Discharge Instructions: Apply over primary dressing as directed. Secondary Dressing: Zetuvit Plus Silicone Border Dressing 5x5 (in/in) (Home Health) 1 x Per Day/30 Days Discharge Instructions: Or equivalent foam border (pt prefers square dressing) Electronic Signature(s) Signed: 03/27/2022 12:26:27 PM By: Jocelyn Shan DO Entered By: Jocelyn Sanchez on 03/27/2022 11:02:46 -------------------------------------------------------------------------------- Problem List Details Patient Name: Date of Service: Jocelyn Sanchez RNA Sanchez. 03/27/2022 10:15 A M Medical Record Number: [id number] Patient Account Number: [id number] Date of Birth/Sex: Treating RN: [birth year] (66 y.o. [name] Primary Care Provider: Birdie Sanchez Other Clinician: Referring Provider: Treating Provider/Extender: Jocelyn Sanchez Weeks in Treatment: 34 Active Problems ICD-10 Encounter Code Description Active Date MDM Diagnosis L89.154 Pressure ulcer of sacral region, stage 4 03/27/2022 No Yes G35 Multiple sclerosis 09/08/2021 No Yes M46.28 Osteomyelitis of vertebra, sacral and sacrococcygeal region 03/27/2022 No Yes Inactive Problems Resolved Problems Electronic Signature(s) Signed: 03/27/2022 12:26:27 PM By: Jocelyn Shan DO Entered By: Jocelyn Sanchez on 03/27/2022 10:59:39 Eddie, Sallyanne Havers Sanchez ([id number]) [id number] Page 5 of 9 -------------------------------------------------------------------------------- Progress Note Details Patient Name: Date of Service: Jocelyn Sanchez RNA Sanchez. 03/27/2022 10:15 A M Medical Record Number: [id number] Patient Account Number: [id number] Date of Birth/Sex: Treating RN: [birth year] (66 y.o. F) Primary Care Provider: Birdie Sanchez Other Clinician: Referring Provider: Treating Provider/Extender: Jocelyn Sanchez Weeks in Treatment: 28 Subjective Chief Complaint Information obtained from Patient evaluation for sacral pressure ulcer 10/19/2020; patient is here for review of a sacral pressure ulcer which I think is in the same position as last time 09/08/2021 oopatient presents for review of sacral pressure ulcer that has not healed in 6 years. She has been followed in our clinic for the past 6 years for this issue. She was last seen 05/2021 History of Present Illness (HPI) The following HPI elements were documented for the patient's wound: Location: Midline sacral region  Quality: Patient describes this as a soreness Severity: 2 out of 10 Duration: Greater than one year prior to presentation to the wound center Timing: Pain occurs most often with dressing changes Context: Developmental result of pressure over time to the sacral region Modifying Factors: Patient has previously had this wound packed with gauze, she has had a wound VAC, and she tells me other various wound care treatments over the past year. Associated Signs and Symptoms: Multiple sclerosis, hypertension, diabetes mellitus type 2, lymphedema 03/27/16 patient presents today on initial evaluation concerning her sacral pressure injury which has been present for roughly one year. She tells me that she does not know of any x-rays or other advanced imaging has been performed in regard to the sacral  region. She has been provided with wound care at the [facility] facility at this point in time. She has MS and is not able to move she tells me "at all". She does tell me however that the facility is very good about re-positioning her at least every 2 hours. With that being said she did fell the wound VAC apparently. She had this twice the first time she tells me that it did not seem to be working very well and the second time it actually made things worse this was just 2 months ago. Therefore this was discontinued. at this point in time today she is not having any signs or symptoms of systemic infection she is on a blood thinner which is Eliquis at this point in time. 03/24/16; patient admitted to our clinic by Jocelyn Sanchez last week. She is a lady with advanced MS who is had a wound on her lower sacral area for most of the last year. She reiterates to me that wound vacs have not worked for her in fact she states they've actually made her wound worse on the last occasion. An x- ray was apparently done in the facility although I don't have these results. 03/31/16; this patient had an x-ray done at the facility that did not show evidence of osteomyelitis. Her bone biopsy did not show osteomyelitis. 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 She had been suggested to have collagen on the base of the wound covered with normal saline wet to dry although her intake nurse reports there was no collagen on the wound surface. 05/15/16 wound is not changed that much. This is a substantial stage IV wound with considerable undermining from 7 to 3:00. I have spoken to the wound care nurse at the [facility] facility, there was no records on what exactly caused the wound VAC to fail or records of the same. 06/05/16; I  received communication from [facility] skilled facility reporting that the patient refuses the wound back more often that it's on. When I confronted her with this today in the room she denied that she was responsible stating for the most part the problem is because she has trouble getting them to deal with her Foley catheter properly. I don't exactly follow her line of reasoning here however apparently they've been putting hydrogel wet to dry and in the facility 07/03/16; I have not heard anything from [place]. The patient states she has not had a wound VAC the last 2-3 days due to "battery problems". She arrives today with wet to dry dressing. She has advanced  MS and is a chronic care resident at the skilled facility 07/31/16; patient is a resident of [facility] skilled facility. She has had the VAC applied but did not arrive with the [facility] in place. Her wound generally looks better perhaps somewhat less tunneling superiorly. There is still some exposed bone however. 09/11/16; I follow this patient with advanced MS who is a resident of [facility] skilled facility on a monthly basis. She arrives 6 weeks after her last visit. The wound VAC apparently is no longer being used according to the patient because the nurses in the facility could not maintain a seal in this area. Via the patient it seems that they're using some form of wet to dry dressing although he received no documentation. The patient states she is eating well she is being turned in the facility. 10/09/16; I follow this patient with advanced MS who is a resident of [facility]. She apparently could not have a wound VAC is the facility could not maintain a seal therefore they're using wet-to-dry dressings however the patient arrives today with better measurements and a nicely granulated wound bed. 11/05/16; patient with advanced MS was a pressure area on the lower sacrum/coccyx. She could not have a wound VAC because the facility could n/ot  maintain a seal. I ordered Prisma last time. Wound dimensions are about the same per intake 12/04/16; pressure area on the lower sacrum. using collagen. Has a new concern on the right great toe 01/01/17; patient has now only a small open area on the lower sacrumoococcyx she is been doing well and this wound is gradually progressing towards closure. The area on the right great toe that looked to me like a paronychia last time is also quite a bit better although the nail here looks as though it's loosening READMISSION 10/19/2020 This is a patient with advanced multiple sclerosis. We had her in clinic for a prolonged period in late 2017 to the summer 2018 with a stage IV wound on her lower sacrum/coccyx. I do not think we healed her out although her wound was a lot better. She was a resident at [facility] skilled facility at the time The patient states the wound reopened about 3 or 4 months ago. I am not completely certain from talking to the patient if this is in the same position as last time or even indeed if it ever closed. 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 She has a home health aide who comes twice a day. She had an air mattress but apparently it does not work As far as I can tell there is not been any major change in her medical problem list except she lives at home now. She is a diabetic on metformin 6/23; patient has not been here in about 6-7 weeks. I am not sure what the issue is and the patient does not seem to really know. She takes scat for transportation although that has not been a problem before. We changed her dressing last time she was here to silver collagen moistened with the backing wet- to-dry and a foam border. Her wound is quite a bit better looking. 7/14; 3-week follow-up. The patient's wound is not as good as last time. It is wider I think more undermining  superiorly. Also there appears to be superficial skin EMILIA, Jocelyn Sanchez ([id number]) [id number] Page 6 of 9 breakdown superiorly and  the skin around the wound and also from about 2-4 o'clock. Not a particularly viable surface. We have been using silver collagen and backing wet-to-dry. The patient lives in her own home. She apparently has care attendance for almost all of the day. She has a surface on her bed but this is clearly not a level 3. She also has home health. She claims to be eating well 8/4; patient presents for 3-week follow-up. She has no issues or complaints today. She reports using wet-to-dry dressings for her sacral wound and collagen to the buttocks wound. She states that her new mattress is arriving today 9/29; its been almost 2 months since the patient was in the clinic. She has a sacral wound which is worse I think with exposed bone to the right under the undermining area. Also concerning is a marked deterioration of her left buttock. With a superficial necrotic area and several small superficial areas. She has been using silver collagen wet-to-dry. The patient has a level 2 surface certainly would benefit from a level 3 if that can be provided. She is relatively immobile cannot turn her self secondary to multiple sclerosis. She tells me she has an aide for most of the day except for 4 hours. She also states she is not up much in a wheelchair. I wonder about her oral intake although she says she eats 3 meals a day and takes Glucerna 10/13; the patient looks somewhat better than 2 weeks ago today. I gave her empiric Augmentin which she took a mobile x-ray was negative for osteomyelitis but they suggested a bone scan. The wound area actually looks somewhat better today 10/27; comes in today with exposed bone complaints of more pain and odor. She looks as though she is losing weight although she claims to be eating well. She takes Ensure 11/16; patient  came in 2 weeks ago with exposed bone. I did a biopsy this was negative for osteomyelitis. Bone culture was also negative showing multiple organisms. I believe I did give her Augmentin which gave her some diarrhea but she does not need further antibiotic. We are using silver alginate to this wound and also the closely position superficial area on the skin just above the wound. She managed to get a level 3 surface that we had ordered she says she does not like it because it is hot and noisy. But nevertheless she is using a 12/14; 1 month follow-up. The patient has 2 wounds her original deep wound on the sacrum which initially had exposed bone. This was cultured and surprisingly negative. She also superiorly had a more superficial but not a healthy looking wound above it. We are using silver alginate on both wounds. Things look improved today. She has completed her antibiotics. Given the negative culture and biopsy of the bone here I do not think any more systemic antibiotics are indicated 09/08/2021 Jocelyn Sanchez Is a 66 year old female with a past medical history of multiple sclerosis that presents to the clinic for continued follow-up of her sacral ulcer. She has no issues or complaints today. She states she was recommended to continue following up in our clinic by her home health nurse specifically to have orders for barrier cream. Patient also complains about her current bed and this is a sand mattress. She would like an air mattress. She has no complaints about the wound today. She denies systemic signs of infection. 4/28; 5-week follow-up. Wound is measuring smaller she has been using silver alginate she has home health  coming once a week and her son is changing it the rest of the days. She did not have the x-ray that was ordered last time apparently this was to be done through a mobile [employer] type company 5/25; patient presents for follow-up. She was switched to collagen at last clinic visit.  She has no issues or complaints today. She was not able to obtain her x- ray. She would like another order for this as she lost the previous one. 7/6; patient presents for follow-up. She had her sacral x-ray completed that showed erosion of the sacrum and coccyx but appears very similar to 2021 in 2019 imaging. No new erosions seen. Patient has been using [facility] antibiotic based on results of a PCR culture that showed high levels of Enterococcus faecalis. She states that the ointment is irritating her. She would like to go back to a different dressing. 10/9; patient presents for follow-up. She has been using silver alginate to the wound bed. Unfortunately she cannot offload this area and lays in bed all day. She denies systemic signs of infection. Patient History Information obtained from Patient. Family History No family history of Cancer, Diabetes, Heart Disease, Hypertension, Kidney Disease, Lung Disease, Seizures, Stroke, Thyroid Problems, Tuberculosis. Social History Former smoker, Marital Status - Divorced, Alcohol Use - Never, Drug Use - No History, Caffeine Use - Rarely. 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 Vitals Time Taken: 10:20 AM, Temperature: 98.1 F, Pulse: 82 bpm, Respiratory Rate: 16 breaths/min, Blood Pressure: 98/62 mmHg. Psychiatric pleasant and cooperative. General Notes: Sacrum: Open wound with pale granulation tissue and small area of exposed bone. No signs of surrounding infection. Integumentary (Hair, Skin) Wound #6 status is Open. Original cause of wound was Pressure Injury. The date acquired was: 07/20/2020. The wound has been in treatment 28 weeks. The wound is located on the Sacrum. The wound measures 3.2cm length x 2.6cm width x 1.5cm depth; 6.535cm^2 area and 9.802cm^3 volume. There is bone and Fat Layer (Subcutaneous Tissue) exposed. There is no tunneling noted, however, there is undermining starting at 9:00 and ending at 12:00 with a maximum distance of 3cm. There is a medium amount of serosanguineous drainage noted. The wound margin is well defined and not attached to the wound base. There is large (67-100%) red, pink granulation within the wound bed. There is a small (1-33%) amount of necrotic tissue within the wound bed including Adherent Slough. The periwound skin appearance had no abnormalities noted for texture. The periwound skin appearance had no abnormalities noted for moisture. The periwound skin appearance had no abnormalities noted for color. Periwound temperature was noted as No Abnormality. 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 Bone is exposed today. She has had an x-ray in the past that shows chronic erosion to this area. Unfortunately she is  not able to offload this area at all. This is a palliative care case. At this time I recommended switching the dressing to Dakin's wet- to-dry. I asked her to offload as best that she can. She knows to call with any questions or  concerns. She knows to go to the ED if there is increased warmth, erythema or purulent drainage from the wound site. Plan Follow-up Appointments: Return Appointment in: - Patient is going to try and have PCP manage wound orders for Home Health. She will call for appt if not able to get that arranged with PCP **Stretcher** Anesthetic: (In clinic) Topical Lidocaine 5% applied to wound bed (In clinic) Topical Lidocaine 4% applied to wound bed Off-Loading: Air fluidized (Group 3) mattress Turn and reposition every 2 hours Additional Orders / Instructions: Follow Nutritious Diet Home Health: New wound care orders this week; continue Home Health for wound care. May utilize formulary equivalent dressing for wound treatment orders unless otherwise specified. - Discontinue Silver Alginate and start Dakin's wet to dry. Other Home Health Orders/Instructions: - Medi HH: 2-3x per week WOUND #6: - Sacrum Wound Laterality: Cleanser: Soap and Water ([facility]) 1 x Per Day/30 Days Discharge Instructions: May shower and wash wound with dial antibacterial soap and water prior to dressing change. Cleanser: Wound Cleanser (Home Health) 1 x Per Day/30 Days Discharge Instructions: Cleanse the wound with wound cleanser prior to applying a clean dressing using gauze sponges, not tissue or cotton balls. Peri-Wound Care: Barrier Cream of Choice (Home Health) 1 x Per Day/30 Days Prim Dressing: Dakin's Solution 0.25%, 16 (oz) 1 x Per Day/30 Days ary Discharge Instructions: Moisten gauze with Dakin's solution Secondary Dressing: Woven Gauze Sponge, Non-Sterile 4x4 in (Home Health) 1 x Per Day/30 Days Discharge Instructions: Apply over primary dressing as directed. Secondary Dressing: Zetuvit Plus Silicone Border Dressing 5x5 (in/in) (Home Health) 1 x Per Day/30 Days Discharge Instructions: Or equivalent foam border (pt prefers square dressing) 1. Dakin's wet-to-dry dressing 2. Aggressive offloading 3. Follow-up  in 2 months Jocelyn Sanchez, Jocelyn Sanchez ([id number]) [id number] Page 8 of 9 Electronic Signature(s) Signed: 03/27/2022 12:26:27 PM By: Jocelyn Shan DO Entered By: Jocelyn Sanchez on 03/27/2022 11:05:31 -------------------------------------------------------------------------------- HxROS Details Patient Name: Date of Service: Jocelyn Sanchez RNA Sanchez. 03/27/2022 10:15 A M Medical Record Number: [id number] Patient Account Number: [id number] Date of Birth/Sex: Treating RN: October 06, 1955 (66 y.o. F) Primary Care Provider: Birdie Sanchez Other Clinician: Referring Provider: Treating Provider/Extender: Jocelyn Sanchez Weeks in Treatment: 61 Information Obtained From Patient Hematologic/Lymphatic Medical History: Positive for: Anemia Negative for: Lymphedema Respiratory Medical History: Past Medical History Notes: h/o bilat pulmonary embolism Cardiovascular Medical History: Positive for: Congestive Heart Failure; Deep Vein Thrombosis - (R) leg; Hypertension; Peripheral Venous Disease Gastrointestinal Medical History: Past Medical History Notes: Esophageal Stricture Endocrine Medical History: Positive for: Type II Diabetes Time with diabetes: 8 years Treated with: Oral agents Blood sugar tested every day: No Integumentary (Skin) Medical History: Negative for: History of Burn Musculoskeletal Medical History: Past Medical History Notes: MS , 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) Neurologic Medical History: Past Medical History Notes: Multiple Sclerosis Psychiatric Medical History: Negative for: Anorexia/bulimia; Confinement Anxiety Past Medical History Notes: anxiety Jocelyn Sanchez, Jocelyn Sanchez ([id number]) [id number] Page 9 of 9 Immunizations Pneumococcal Vaccine: Received Pneumococcal Vaccination: Yes Received Pneumococcal Vaccination On or After 60th Birthday: No Immunization Notes: last tetanus about 3  years ago Implantable Devices None Family and Social History Cancer: No; Diabetes: No; Heart Disease: No;  Hypertension: No; Kidney Disease: No; Lung Disease: No; Seizures: No; Stroke: No; Thyroid Problems: No; Tuberculosis: No; Former smoker; Marital Status - Divorced; Alcohol Use: Never; Drug Use: No History; Caffeine Use: Rarely; Financial Concerns: No; Food, Clothing or Shelter Needs: No; Support System Lacking: No; Transportation Concerns: No Electronic Signature(s) Signed: 03/27/2022 12:26:27 PM By: Jocelyn Shan DO Entered By: Jocelyn Sanchez on 03/27/2022 11:02:14 -------------------------------------------------------------------------------- SuperBill Details Patient Name: Date of Service: Jocelyn Sanchez RNA Sanchez. 03/27/2022 Medical Record Number: [id number] Patient Account Number: [id number] Date of Birth/Sex: Treating RN: 12-11-1955 (66 y.o. [name] Primary Care Provider: Birdie Sanchez Other Clinician: Referring Provider: Treating Provider/Extender: Jocelyn Sanchez Weeks in Treatment: 28 Diagnosis Coding ICD-10 Codes Code Description L89.154 Pressure ulcer of sacral region, stage 4 G35 Multiple sclerosis M46.28 Osteomyelitis of vertebra, sacral and sacrococcygeal region Facility Procedures : CPT4 Code: [id number] Description: 99213 - WOUND CARE VISIT-LEV 3 EST PT Modifier: Quantity: 1 Physician Procedures : CPT4 Code Description Modifier [id number] - WC PHYS LEVEL 3 - EST PT ICD-10 Diagnosis Description L89.154 Pressure ulcer of sacral region, stage 4 G35 Multiple sclerosis M46.28 Osteomyelitis of vertebra, sacral and sacrococcygeal region Quantity: 1 Electronic Signature(s) Signed: 03/27/2022 12:26:27 PM By: Jocelyn Shan DO Entered By: Jocelyn Sanchez on 03/27/2022 11:05:36

## 2022-08-29 ENCOUNTER — Ambulatory Visit (INDEPENDENT_AMBULATORY_CARE_PROVIDER_SITE_OTHER): Payer: Medicare PPO | Admitting: Podiatry

## 2022-08-29 DIAGNOSIS — B351 Tinea unguium: Secondary | ICD-10-CM | POA: Diagnosis not present

## 2022-08-29 DIAGNOSIS — M79674 Pain in right toe(s): Secondary | ICD-10-CM

## 2022-08-29 DIAGNOSIS — M79675 Pain in left toe(s): Secondary | ICD-10-CM

## 2022-08-29 NOTE — Progress Notes (Signed)
  Subjective:  Patient ID: Jocelyn Sanchez, female    DOB: 11/10/1955,  MRN: [id number]  Chief Complaint  Patient presents with   Nail Problem    Nail trim    67 y.o. female returns for the above complaint.  Patient presents with thickened elongated dystrophic toenails x 10 mild pain on palpation hurts with ambulation worse with pressure she would like for me to be done she is not able to do it herself.  Objective:  There were no vitals filed for this visit. Podiatric Exam: Vascular: dorsalis pedis and posterior tibial pulses are palpable bilateral. Capillary return is immediate. Temperature gradient is WNL. Skin turgor WNL  Sensorium: Normal Semmes Weinstein monofilament test. Normal tactile sensation bilaterally. Nail Exam: Pt has thick disfigured discolored nails with subungual debris noted bilateral entire nail hallux through fifth toenails.  Pain on palpation to the nails. Ulcer Exam: There is no evidence of ulcer or pre-ulcerative changes or infection. Orthopedic Exam: Muscle tone and strength are WNL. No limitations in general ROM. No crepitus or effusions noted.  Skin: No Porokeratosis. No infection or ulcers    Assessment & Plan:   1. Pain due to onychomycosis of toenails of both feet     Patient was evaluated and treated and all questions answered.  Onychomycosis with pain  -Nails palliatively debrided as below. -Educated on self-care  Procedure: Nail Debridement Rationale: pain  Type of Debridement: manual, sharp debridement. Instrumentation: Nail nipper, rotary burr. Number of Nails: 10  Procedures and Treatment: Consent by patient was obtained for treatment procedures. The patient understood the discussion of treatment and procedures well. All questions were answered thoroughly reviewed. Debridement of mycotic and hypertrophic toenails, 1 through 5 bilateral and clearing of subungual debris. No ulceration, no infection noted.  Return Visit-Office Procedure: Patient  instructed to return to the office for a follow up visit 3 months for continued evaluation and treatment.  Boneta Lucks, DPM    No follow-ups on file.

## 2022-11-08 ENCOUNTER — Emergency Department (HOSPITAL_COMMUNITY): Payer: Medicare PPO

## 2022-11-08 ENCOUNTER — Encounter (HOSPITAL_COMMUNITY): Payer: Self-pay

## 2022-11-08 ENCOUNTER — Other Ambulatory Visit: Payer: Self-pay

## 2022-11-08 ENCOUNTER — Inpatient Hospital Stay (HOSPITAL_COMMUNITY): Payer: Medicare PPO

## 2022-11-08 ENCOUNTER — Inpatient Hospital Stay (HOSPITAL_COMMUNITY)
Admission: EM | Admit: 2022-11-08 | Discharge: 2022-11-14 | DRG: 377 | Disposition: A | Discharge status: Home / Self Care | Payer: Medicare PPO | Attending: Internal Medicine | Admitting: Internal Medicine

## 2022-11-08 DIAGNOSIS — E871 Hypo-osmolality and hyponatremia: Secondary | ICD-10-CM | POA: Diagnosis present

## 2022-11-08 DIAGNOSIS — G8250 Quadriplegia, unspecified: Secondary | ICD-10-CM | POA: Diagnosis present

## 2022-11-08 DIAGNOSIS — K2970 Gastritis, unspecified, without bleeding: Secondary | ICD-10-CM | POA: Diagnosis present

## 2022-11-08 DIAGNOSIS — Z87891 Personal history of nicotine dependence: Secondary | ICD-10-CM

## 2022-11-08 DIAGNOSIS — E119 Type 2 diabetes mellitus without complications: Secondary | ICD-10-CM | POA: Diagnosis present

## 2022-11-08 DIAGNOSIS — N179 Acute kidney failure, unspecified: Secondary | ICD-10-CM | POA: Diagnosis present

## 2022-11-08 DIAGNOSIS — E876 Hypokalemia: Secondary | ICD-10-CM | POA: Diagnosis present

## 2022-11-08 DIAGNOSIS — Z79899 Other long term (current) drug therapy: Secondary | ICD-10-CM

## 2022-11-08 DIAGNOSIS — G35 Multiple sclerosis: Secondary | ICD-10-CM | POA: Diagnosis present

## 2022-11-08 DIAGNOSIS — K31811 Angiodysplasia of stomach and duodenum with bleeding: Secondary | ICD-10-CM | POA: Diagnosis present

## 2022-11-08 DIAGNOSIS — Z23 Encounter for immunization: Secondary | ICD-10-CM | POA: Diagnosis present

## 2022-11-08 DIAGNOSIS — K449 Diaphragmatic hernia without obstruction or gangrene: Secondary | ICD-10-CM | POA: Diagnosis present

## 2022-11-08 DIAGNOSIS — N200 Calculus of kidney: Secondary | ICD-10-CM | POA: Diagnosis present

## 2022-11-08 DIAGNOSIS — Z66 Do not resuscitate: Secondary | ICD-10-CM | POA: Diagnosis present

## 2022-11-08 DIAGNOSIS — L89310 Pressure ulcer of right buttock, unstageable: Secondary | ICD-10-CM | POA: Diagnosis present

## 2022-11-08 DIAGNOSIS — Z820 Family history of epilepsy and other diseases of the nervous system: Secondary | ICD-10-CM

## 2022-11-08 DIAGNOSIS — Z1612 Extended spectrum beta lactamase (ESBL) resistance: Secondary | ICD-10-CM | POA: Diagnosis present

## 2022-11-08 DIAGNOSIS — E872 Acidosis, unspecified: Secondary | ICD-10-CM | POA: Diagnosis present

## 2022-11-08 DIAGNOSIS — L89159 Pressure ulcer of sacral region, unspecified stage: Secondary | ICD-10-CM | POA: Diagnosis present

## 2022-11-08 DIAGNOSIS — Z882 Allergy status to sulfonamides status: Secondary | ICD-10-CM

## 2022-11-08 DIAGNOSIS — I959 Hypotension, unspecified: Secondary | ICD-10-CM | POA: Diagnosis present

## 2022-11-08 DIAGNOSIS — K5900 Constipation, unspecified: Secondary | ICD-10-CM | POA: Insufficient documentation

## 2022-11-08 DIAGNOSIS — D649 Anemia, unspecified: Secondary | ICD-10-CM | POA: Diagnosis not present

## 2022-11-08 DIAGNOSIS — D62 Acute posthemorrhagic anemia: Secondary | ICD-10-CM | POA: Diagnosis present

## 2022-11-08 DIAGNOSIS — E861 Hypovolemia: Secondary | ICD-10-CM | POA: Diagnosis not present

## 2022-11-08 DIAGNOSIS — I5042 Chronic combined systolic (congestive) and diastolic (congestive) heart failure: Secondary | ICD-10-CM | POA: Diagnosis present

## 2022-11-08 DIAGNOSIS — K2100 Gastro-esophageal reflux disease with esophagitis, without bleeding: Secondary | ICD-10-CM | POA: Diagnosis not present

## 2022-11-08 DIAGNOSIS — L89153 Pressure ulcer of sacral region, stage 3: Secondary | ICD-10-CM | POA: Diagnosis present

## 2022-11-08 DIAGNOSIS — R933 Abnormal findings on diagnostic imaging of other parts of digestive tract: Secondary | ICD-10-CM | POA: Diagnosis present

## 2022-11-08 DIAGNOSIS — K3189 Other diseases of stomach and duodenum: Secondary | ICD-10-CM | POA: Insufficient documentation

## 2022-11-08 DIAGNOSIS — S31000A Unspecified open wound of lower back and pelvis without penetration into retroperitoneum, initial encounter: Secondary | ICD-10-CM | POA: Diagnosis not present

## 2022-11-08 DIAGNOSIS — K766 Portal hypertension: Secondary | ICD-10-CM | POA: Diagnosis present

## 2022-11-08 DIAGNOSIS — R079 Chest pain, unspecified: Secondary | ICD-10-CM | POA: Diagnosis present

## 2022-11-08 DIAGNOSIS — D500 Iron deficiency anemia secondary to blood loss (chronic): Secondary | ICD-10-CM | POA: Diagnosis not present

## 2022-11-08 DIAGNOSIS — B9620 Unspecified Escherichia coli [E. coli] as the cause of diseases classified elsewhere: Secondary | ICD-10-CM | POA: Diagnosis present

## 2022-11-08 DIAGNOSIS — K5641 Fecal impaction: Secondary | ICD-10-CM

## 2022-11-08 DIAGNOSIS — N390 Urinary tract infection, site not specified: Secondary | ICD-10-CM

## 2022-11-08 DIAGNOSIS — L89626 Pressure-induced deep tissue damage of left heel: Secondary | ICD-10-CM | POA: Diagnosis present

## 2022-11-08 DIAGNOSIS — Z86711 Personal history of pulmonary embolism: Secondary | ICD-10-CM

## 2022-11-08 DIAGNOSIS — I110 Hypertensive heart disease with heart failure: Secondary | ICD-10-CM | POA: Diagnosis present

## 2022-11-08 DIAGNOSIS — Z888 Allergy status to other drugs, medicaments and biological substances status: Secondary | ICD-10-CM

## 2022-11-08 DIAGNOSIS — Z87442 Personal history of urinary calculi: Secondary | ICD-10-CM | POA: Insufficient documentation

## 2022-11-08 DIAGNOSIS — Z7401 Bed confinement status: Secondary | ICD-10-CM

## 2022-11-08 DIAGNOSIS — L89313 Pressure ulcer of right buttock, stage 3: Secondary | ICD-10-CM | POA: Diagnosis present

## 2022-11-08 DIAGNOSIS — Z7901 Long term (current) use of anticoagulants: Secondary | ICD-10-CM

## 2022-11-08 DIAGNOSIS — Z7984 Long term (current) use of oral hypoglycemic drugs: Secondary | ICD-10-CM

## 2022-11-08 LAB — COMPREHENSIVE METABOLIC PANEL
ALT: 7 U/L (ref 0–44)
AST: 5 U/L — ABNORMAL LOW (ref 15–41)
Albumin: 2.9 g/dL — ABNORMAL LOW (ref 3.5–5.0)
Alkaline Phosphatase: 82 U/L (ref 38–126)
Anion gap: 13 (ref 5–15)
BUN: 51 mg/dL — ABNORMAL HIGH (ref 8–23)
CO2: 8 mmol/L — ABNORMAL LOW (ref 22–32)
Calcium: 8.3 mg/dL — ABNORMAL LOW (ref 8.9–10.3)
Chloride: 107 mmol/L (ref 98–111)
Creatinine, Ser: 1.1 mg/dL — ABNORMAL HIGH (ref 0.44–1.00)
GFR, Estimated: 55 mL/min — ABNORMAL LOW (ref >60)
Glucose, Bld: 81 mg/dL (ref 70–99)
Potassium: 3.4 mmol/L — ABNORMAL LOW (ref 3.5–5.1)
Sodium: 128 mmol/L — ABNORMAL LOW (ref 135–145)
Total Bilirubin: 0.9 mg/dL (ref 0.3–1.2)
Total Protein: 7.5 g/dL (ref 6.5–8.1)

## 2022-11-08 LAB — TYPE AND SCREEN
Unit division: 0
Unit division: 0

## 2022-11-08 LAB — CBC WITH DIFFERENTIAL/PLATELET
Abs Immature Granulocytes: 0.08 10*3/uL — ABNORMAL HIGH (ref 0.00–0.07)
Basophils Absolute: 0 10*3/uL (ref 0.0–0.1)
Basophils Relative: 0 %
Eosinophils Absolute: 0.1 10*3/uL (ref 0.0–0.5)
Eosinophils Relative: 1 %
HCT: 15.7 % — ABNORMAL LOW (ref 36.0–46.0)
Hemoglobin: 3.6 g/dL — CL (ref 12.0–15.0)
Immature Granulocytes: 1 %
Lymphocytes Relative: 15 %
Lymphs Abs: 1.5 10*3/uL (ref 0.7–4.0)
MCH: 15.3 pg — ABNORMAL LOW (ref 26.0–34.0)
MCHC: 22.9 g/dL — ABNORMAL LOW (ref 30.0–36.0)
MCV: 66.5 fL — ABNORMAL LOW (ref 80.0–100.0)
Monocytes Absolute: 0.6 10*3/uL (ref 0.1–1.0)
Monocytes Relative: 6 %
Neutro Abs: 7.7 10*3/uL (ref 1.7–7.7)
Neutrophils Relative %: 77 %
Platelets: 784 10*3/uL — ABNORMAL HIGH (ref 150–400)
RBC: 2.36 MIL/uL — ABNORMAL LOW (ref 3.87–5.11)
RDW: 27.6 % — ABNORMAL HIGH (ref 11.5–15.5)
WBC: 10 10*3/uL (ref 4.0–10.5)
nRBC: 1.5 % — ABNORMAL HIGH (ref 0.0–0.2)

## 2022-11-08 LAB — URINALYSIS, W/ REFLEX TO CULTURE (INFECTION SUSPECTED)
Bilirubin Urine: NEGATIVE
Glucose, UA: NEGATIVE mg/dL
Ketones, ur: 5 mg/dL — AB
Nitrite: NEGATIVE
Protein, ur: 100 mg/dL — AB
RBC / HPF: >50 RBC/hpf (ref 0–5)
Specific Gravity, Urine: 1.012 (ref 1.005–1.030)
WBC, UA: >50 WBC/hpf (ref 0–5)
pH: 6 (ref 5.0–8.0)

## 2022-11-08 LAB — I-STAT CHEM 8, ED
BUN: 61 mg/dL — ABNORMAL HIGH (ref 8–23)
Calcium, Ion: 1.24 mmol/L (ref 1.15–1.40)
Chloride: 110 mmol/L (ref 98–111)
Creatinine, Ser: 1.1 mg/dL — ABNORMAL HIGH (ref 0.44–1.00)
Glucose, Bld: 78 mg/dL (ref 70–99)
HCT: 16 % — ABNORMAL LOW (ref 36.0–46.0)
Hemoglobin: 5.4 g/dL — CL (ref 12.0–15.0)
Potassium: 4.4 mmol/L (ref 3.5–5.1)
Sodium: 131 mmol/L — ABNORMAL LOW (ref 135–145)
TCO2: 10 mmol/L — ABNORMAL LOW (ref 22–32)

## 2022-11-08 LAB — URINE CULTURE

## 2022-11-08 LAB — TROPONIN I (HIGH SENSITIVITY)
Troponin I (High Sensitivity): 5 ng/L (ref <18)
Troponin I (High Sensitivity): 5 ng/L (ref <18)

## 2022-11-08 LAB — CBC
HCT: 27.9 % — ABNORMAL LOW (ref 36.0–46.0)
Hemoglobin: 7.3 g/dL — ABNORMAL LOW (ref 12.0–15.0)
MCH: 20.2 pg — ABNORMAL LOW (ref 26.0–34.0)
MCHC: 26.2 g/dL — ABNORMAL LOW (ref 30.0–36.0)
MCV: 77.1 fL — ABNORMAL LOW (ref 80.0–100.0)
Platelets: 619 10*3/uL — ABNORMAL HIGH (ref 150–400)
RBC: 3.62 MIL/uL — ABNORMAL LOW (ref 3.87–5.11)
RDW: 30.7 % — ABNORMAL HIGH (ref 11.5–15.5)
WBC: 11.1 10*3/uL — ABNORMAL HIGH (ref 4.0–10.5)
nRBC: 2.2 % — ABNORMAL HIGH (ref 0.0–0.2)

## 2022-11-08 LAB — BPAM RBC
Blood Product Expiration Date: 202406122359
Blood Product Expiration Date: 202406182359
Blood Product Expiration Date: 202406242359
ISSUE DATE / TIME: 202405221448
Unit Type and Rh: 5100
Unit Type and Rh: 5100

## 2022-11-08 LAB — PROTIME-INR
INR: 2.5 — ABNORMAL HIGH (ref 0.8–1.2)
Prothrombin Time: 26.9 seconds — ABNORMAL HIGH (ref 11.4–15.2)

## 2022-11-08 LAB — BASIC METABOLIC PANEL
Anion gap: 12 (ref 5–15)
BUN: 48 mg/dL — ABNORMAL HIGH (ref 8–23)
CO2: 8 mmol/L — ABNORMAL LOW (ref 22–32)
Calcium: 8 mg/dL — ABNORMAL LOW (ref 8.9–10.3)
Chloride: 110 mmol/L (ref 98–111)
Creatinine, Ser: 0.91 mg/dL (ref 0.44–1.00)
GFR, Estimated: >60 mL/min (ref >60)
Glucose, Bld: 83 mg/dL (ref 70–99)
Potassium: 3.5 mmol/L (ref 3.5–5.1)
Sodium: 130 mmol/L — ABNORMAL LOW (ref 135–145)

## 2022-11-08 LAB — PREPARE RBC (CROSSMATCH)

## 2022-11-08 LAB — MAGNESIUM: Magnesium: 1.5 mg/dL — ABNORMAL LOW (ref 1.7–2.4)

## 2022-11-08 LAB — LIPASE, BLOOD: Lipase: 88 U/L — ABNORMAL HIGH (ref 11–51)

## 2022-11-08 LAB — LACTIC ACID, PLASMA: Lactic Acid, Venous: 0.5 mmol/L (ref 0.5–1.9)

## 2022-11-08 LAB — CREATININE, URINE, RANDOM: Creatinine, Urine: 19 mg/dL

## 2022-11-08 LAB — SODIUM, URINE, RANDOM: Sodium, Ur: 51 mmol/L

## 2022-11-08 LAB — POC OCCULT BLOOD, ED: Fecal Occult Bld: NEGATIVE

## 2022-11-08 MED ORDER — VANCOMYCIN HCL IN DEXTROSE 1-5 GM/200ML-% IV SOLN: 1000.0000 mg | Freq: Two times a day (BID) | INTRAVENOUS | Status: DC

## 2022-11-08 MED ORDER — SODIUM CHLORIDE 0.9% IV SOLUTION: Freq: Once | INTRAVENOUS | Status: AC

## 2022-11-08 MED ORDER — POTASSIUM CHLORIDE 20 MEQ PO PACK: 40.0000 meq | PACK | Freq: Once | ORAL | Status: DC

## 2022-11-08 MED ORDER — LACTATED RINGERS IV BOLUS (SEPSIS)
1000.0000 mL | Freq: Once | INTRAVENOUS | Status: AC
  Administered 2022-11-08: 1000 mL via INTRAVENOUS

## 2022-11-08 MED ORDER — VANCOMYCIN HCL IN DEXTROSE 1-5 GM/200ML-% IV SOLN
1000.0000 mg | INTRAVENOUS | Status: DC
  Administered 2022-11-09 – 2022-11-10 (×2): 1000 mg via INTRAVENOUS
  Filled 2022-11-08 (×2): qty 200

## 2022-11-08 MED ORDER — PIPERACILLIN-TAZOBACTAM 3.375 G IVPB
3.3750 g | Freq: Three times a day (TID) | INTRAVENOUS | Status: AC
  Administered 2022-11-08 – 2022-11-13 (×16): 3.375 g via INTRAVENOUS
  Filled 2022-11-08 (×16): qty 50

## 2022-11-08 MED ORDER — METRONIDAZOLE 500 MG/100ML IV SOLN
500.0000 mg | Freq: Once | INTRAVENOUS | Status: DC
  Filled 2022-11-08: qty 100

## 2022-11-08 MED ORDER — SODIUM CHLORIDE 0.9 % IV BOLUS (SEPSIS)
1000.0000 mL | Freq: Once | INTRAVENOUS | Status: AC
  Administered 2022-11-08: 1000 mL via INTRAVENOUS

## 2022-11-08 MED ORDER — POLYETHYLENE GLYCOL 3350 17 G PO PACK
17.0000 g | PACK | Freq: Two times a day (BID) | ORAL | Status: DC
  Administered 2022-11-08 – 2022-11-10 (×5): 17 g via ORAL
  Filled 2022-11-08 (×6): qty 1

## 2022-11-08 MED ORDER — VANCOMYCIN HCL 1500 MG/300ML IV SOLN
1500.0000 mg | Freq: Once | INTRAVENOUS | Status: AC
  Administered 2022-11-08: 1500 mg via INTRAVENOUS
  Filled 2022-11-08 (×2): qty 300

## 2022-11-08 MED ORDER — SODIUM CHLORIDE 0.9 % IV SOLN
2.0000 g | Freq: Once | INTRAVENOUS | Status: AC
  Administered 2022-11-08: 2 g via INTRAVENOUS
  Filled 2022-11-08: qty 12.5

## 2022-11-08 MED ORDER — MAGNESIUM SULFATE 2 GM/50ML IV SOLN
2.0000 g | Freq: Once | INTRAVENOUS | Status: AC
  Administered 2022-11-08: 2 g via INTRAVENOUS
  Filled 2022-11-08: qty 50

## 2022-11-08 MED ORDER — SODIUM CHLORIDE 0.9 % IV SOLN: INTRAVENOUS | Status: DC

## 2022-11-08 MED ORDER — VANCOMYCIN HCL IN DEXTROSE 1-5 GM/200ML-% IV SOLN: 1000.0000 mg | Freq: Once | INTRAVENOUS | Status: DC

## 2022-11-08 MED ORDER — ALBUTEROL SULFATE (2.5 MG/3ML) 0.083% IN NEBU
INHALATION_SOLUTION | RESPIRATORY_TRACT | Status: AC
  Filled 2022-11-08: qty 3

## 2022-11-08 NOTE — Progress Notes (Signed)
Pharmacy Antibiotic Note  Jocelyn Sanchez is a 67 y.o. female admitted on 11/08/2022 with chest pain. Patient with sacral wound that appears infected. Pharmacy has been consulted for vancomycin and Zosyn dosing.  Plan: -Vancomycin 1500 mg IV x 1 followed by 1000 mg q24h -Zosyn 3.375 g IV q8h extended infusion -Continue to follow renal function, cultures and clinical progress for dose adjustments and de-escalation as indicated  Height: 5\' 3"  (160 cm) Weight: 67.1 kg (148 lb) IBW/kg (Calculated) : 52.4  Temp (24hrs), Avg:97.7 F (36.5 C), Min:97.4 F (36.3 C), Max:98 F (36.7 C)  Recent Labs  Lab 11/08/22 1216 11/08/22 1239  WBC 10.0  --   CREATININE 1.10* 1.10*  LATICACIDVEN 0.5  --     Estimated Creatinine Clearance: 45.1 mL/min (A) (by C-G formula based on SCr of 1.1 mg/dL (H)).    Allergies  Allergen Reactions   Sulfa Antibiotics Shortness Of Breath and Swelling   Chlorhexidine     Antimicrobials this admission: Vancomycin 5/22 >> Zosyn 5/22 >> Cefepime 5/22 x 1  Dose adjustments this admission: NA  Microbiology results: 5/22 BCx: pending 5/22 UCx: pending   Thank you for allowing pharmacy to be a part of this patient's care.  Pricilla Riffle, PharmD, BCPS Clinical Pharmacist 11/08/2022 5:31 PM

## 2022-11-08 NOTE — Assessment & Plan Note (Addendum)
This is chronic.  Current CAT scan confirms renal calculi.  This is a focus of infection for patient.  Does have UTI based on urinalysis.  Please follow-up urine cultures

## 2022-11-08 NOTE — Consult Note (Signed)
NAME:  Jocelyn Sanchez, MRN:  [id number], DOB:  1955-07-16, LOS: 0 ADMISSION DATE:  11/08/2022, CONSULTATION DATE:  11/08/22 REFERRING MD:  Dr. Durwin Nora, CHIEF COMPLAINT:  hypotension   History of Present Illness:   67 year old female with PMH significant for combined systolic and diastolic heart failure, HTN, GERD, DM, PE on Eliquis, MS, anemia, gait disorder now bed bound, lymphedema, and sacral decubitus wound.  Patient is from home with home health support.  Presented via EMS after episode of episode of  non-bloody emesis with chest pain described as burning.  Recent constipation.    On ER arrival, patient hypotensive with SBP's 70-80's and initially tachycardic, afebrile, on room air with normal O2 saturations and asymptomatic, denying SOB or CP.  Stated her normal SBP runs around 100.  On workup, labs significant for Na 128, K 3.4, BUN/ sCr 51/ 1.1, Mag 1.5, lipase 88, trop hs 5> 5, lactic 0.5, WBC 10, Hgb 3.6 > repeat 5.4, HCT 15.7> 16, plts 784, INR 2.5, UA w/ large leukocytes, neg nitrates, WBC> 50, mod Hgb w/> 50 RBCs, FOB neg, CXR without acute process.  Cultures sent, started on broad spectrum antibiotics and given total of 2L IVF's, mag replaced.  Noted to have large amount of stool in rectum, manually disimpacted without evidence of melena.  FOB neg, sacral wound concerning for infection and/ or cellulitis.  PRBC ordered.  Some improvement in blood pressure, PCCM consulted for possible ICU admit.   Pertinent  Medical History   Past Medical History:  Diagnosis Date   Anemia    DM II (diabetes mellitus, type II), controlled (HCC)    Gait disorder    HTN (hypertension)    Lymphedema    MS (multiple sclerosis) (HCC)    Pulmonary embolism (HCC)    Significant Hospital Events: Including procedures, antibiotic start and stop dates in addition to other pertinent events     Interim History / Subjective:  Seen in the ED First unit PRBC being transfused. Blood pressure improved to  115/63 Her main complaint is throat pain and heartburn and constipation  Objective   Blood pressure (!) 74/55, pulse 92, temperature 98 F (36.7 C), temperature source Axillary, resp. rate 18, height 5\' 8"  (1.727 m), weight 67.1 kg, SpO2 100 %.       No intake or output data in the 24 hours ending 11/08/22 1604 Filed Weights   11/08/22 1200  Weight: 67.1 kg   Examination: General: Chronically ill-appearing elderly woman, lying supine, no distress HENT: Pale, no icterus, no JVD Lungs: Clear breath sounds bilateral, no accessory muscle use Cardiovascular: S1-S2 regular, no murmur Abdomen: Soft, nontender Extremities: No edema, bilateral upper extremity contractures deformity Neuro: Awake, interactive flicker movement of toes, grip 3/5 Skin foul-smelling sacral ulcer with tunneling, picture as in hospitalist note  Labs show a BUN of 61, MCV 66, INR 2.5, normal LFTs   Resolved Hospital Problem list    Assessment & Plan:   Hypotension  -improved after first unit PRBC Acute on chronic microcytic anemia  Hx of PE on Eliquis  AKI  Hyponatremia Hypokalemia Hypomagnesemia  Chronic sacral decubitus (POA) Chronic immobility/ bed bound   Hx HfrEF/ HFpEF  -4 units PRBC planned -Evidence of GI bleeding may have to consider CT abdomen to rule out retroperitoneal bleed -Obviously Eliquis will be held and consider reversal.  Management per hospitalist service, PCCM will standby since hypotension has improved She has good access including right EJ, and 2 peripheral IVs  Best Practice (right click and "Reselect all SmartList Selections" daily)   Per [facility] Code Status:  DNR Last date of multidisciplinary goals of care discussion [per [facility]]  Labs   CBC: Recent Labs  Lab 11/08/22 1216 11/08/22 1239  WBC 10.0  --   NEUTROABS 7.7  --   HGB 3.6* 5.4*  HCT 15.7* 16.0*  MCV 66.5*  --   PLT 784*  --     Basic Metabolic Panel: Recent Labs  Lab 11/08/22 1216 11/08/22 1239  NA  128* 131*  K 3.4* 4.4  CL 107 110  CO2 8*  --   GLUCOSE 81 78  BUN 51* 61*  CREATININE 1.10* 1.10*  CALCIUM 8.3*  --   MG 1.5*  --    GFR: Estimated Creatinine Clearance: 49.4 mL/min (A) (by C-G formula based on SCr of 1.1 mg/dL (H)). Recent Labs  Lab 11/08/22 1216  WBC 10.0  LATICACIDVEN 0.5    Liver Function Tests: Recent Labs  Lab 11/08/22 1216  AST 5*  ALT 7  ALKPHOS 82  BILITOT 0.9  PROT 7.5  ALBUMIN 2.9*   Recent Labs  Lab 11/08/22 1216  LIPASE 88*   No results for input(s): "AMMONIA" in the last 168 hours.  ABG    Component Value Date/Time   PHART 7.247 (L) 08/07/2020 0809   PCO2ART 20.3 (L) 08/07/2020 0809   PO2ART 74.3 (L) 08/07/2020 0809   HCO3 8.5 (L) 08/07/2020 0809   TCO2 10 (L) 11/08/2022 1239   ACIDBASEDEF 17.2 (H) 08/07/2020 0809   O2SAT 94.8 08/07/2020 0809     Coagulation Profile: Recent Labs  Lab 11/08/22 1216  INR 2.5*    Cardiac Enzymes: No results for input(s): "CKTOTAL", "CKMB", "CKMBINDEX", "TROPONINI" in the last 168 hours.  HbA1C: Hgb A1c MFr Bld  Date/Time Value Ref Range Status  07/27/2019 07:05 AM 6.7 (H) 4.8 - 5.6 % Final    Comment:    (NOTE)         Prediabetes: 5.7 - 6.4         Diabetes: >6.4         Glycemic control for adults with diabetes: <7.0   02/11/2019 02:03 AM 5.9 (H) 4.8 - 5.6 % Final    Comment:    (NOTE) Pre diabetes:          5.7%-6.4% Diabetes:              >6.4% Glycemic control for   <7.0% adults with diabetes     CBG: No results for input(s): "GLUCAP" in the last 168 hours.  Review of Systems:   Throat pain Bedbound Emesis x 1 Constipation No history of hematemesis or melena  Past Medical History:  She,  has a past medical history of Anemia, DM II (diabetes mellitus, type II), controlled (HCC), Gait disorder, HTN (hypertension), Lymphedema, MS (multiple sclerosis) (HCC), and Pulmonary embolism (HCC).   Surgical History:   Past Surgical History:  Procedure Laterality Date    ENTEROSCOPY N/A 08/10/2020   Procedure: ENTEROSCOPY;  Surgeon: Kerin Salen, MD;  Location: [facility];  Service: Gastroenterology;  Laterality: N/A;   ESOPHAGOGASTRODUODENOSCOPY N/A 07/12/2016   Procedure: ESOPHAGOGASTRODUODENOSCOPY (EGD);  Surgeon: Hilarie Fredrickson, MD;  Location: [facility];  Service: Endoscopy;  Laterality: N/A;   ESOPHAGOGASTRODUODENOSCOPY N/A 02/11/2019   Procedure: ESOPHAGOGASTRODUODENOSCOPY (EGD);  Surgeon: Jeani Hawking, MD;  Location: [facility];  Service: Endoscopy;  Laterality: N/A;   FLEXIBLE SIGMOIDOSCOPY Left 03/09/2015   Procedure: FLEXIBLE SIGMOIDOSCOPY;  Surgeon: Jeani Hawking,  MD;  Location: [facility];  Service: Endoscopy;  Laterality: Left;   HOT HEMOSTASIS N/A 02/11/2019   Procedure: HOT HEMOSTASIS (ARGON PLASMA COAGULATION/BICAP);  Surgeon: Jeani Hawking, MD;  Location: [facility];  Service: Endoscopy;  Laterality: N/A;   HOT HEMOSTASIS N/A 08/10/2020   Procedure: HOT HEMOSTASIS (ARGON PLASMA COAGULATION/BICAP);  Surgeon: Kerin Salen, MD;  Location: [facility];  Service: Gastroenterology;  Laterality: N/A;     Social History:   reports that she has quit smoking. Her smoking use included cigarettes. She has never used smokeless tobacco. She reports that she does not drink alcohol and does not use drugs.   Family History:  Her family history includes Multiple sclerosis in her mother.   Allergies Allergies  Allergen Reactions   Sulfa Antibiotics Shortness Of Breath and Swelling   Chlorhexidine      Home Medications  Prior to Admission medications   Medication Sig Start Date End Date Taking? Authorizing Provider  acetaminophen (TYLENOL) 500 MG tablet Take 1,000 mg by mouth 2 (two) times daily.    [provider]  Ascorbic Acid (VITAMIN C) 1000 MG tablet Take 1,000 mg by mouth daily. 08/02/20   [provider]  carvedilol (COREG) 6.25 MG tablet Take 6.25 mg by mouth 2 (two) times daily. 12/21/18   [provider]  [employer] D3  50 MCG (2000 UT) CAPS Take 2,000 Units by mouth daily. 08/02/20   [provider]  ELIQUIS 5 MG TABS tablet Take 5 mg by mouth 2 (two) times daily. 03/11/19   [provider]  ENTRESTO 24-26 MG Take 1 tablet by mouth 2 (two) times daily. 03/13/19   [provider]  guaifenesin (ROBITUSSIN) 100 MG/5ML syrup Take 200 mg by mouth 3 (three) times daily as needed for cough.    [provider]  lactulose (CHRONULAC) 10 GM/15ML solution Take 30 mLs by mouth daily as needed for constipation. 07/17/20   [provider]  liver oil-zinc oxide (DESITIN) 40 % ointment Apply 1 application topically as needed for irritation. 08/12/20   Briant Cedar, MD  metFORMIN (GLUCOPHAGE) 500 MG tablet Take 500 mg by mouth 2 (two) times daily. 07/25/19   [provider]  methocarbamol (ROBAXIN) 500 MG tablet Take 750 mg by mouth 2 (two) times daily. 06/27/20   [provider]  omeprazole (PRILOSEC) 20 MG capsule Take 1 capsule (20 mg total) by mouth 2 (two) times daily before a meal. 08/12/20 09/11/20  Briant Cedar, MD  traZODone (DESYREL) 100 MG tablet Take 100 mg by mouth at bedtime. 06/04/20   [provider]  Zinc Sulfate 220 (50 Zn) MG TABS Take 220 mg by mouth daily. 08/02/20   [provider]     Critical care time: 25 m     Cyril Mourning MD. [facility]. [facility] Pager : [phone]  If no response to pager , please call [phone] until 7 pm After 7:00 pm call Elink  [phone]   11/08/2022

## 2022-11-08 NOTE — ED Notes (Signed)
ED TO INPATIENT HANDOFF REPORT  Name/Age/Gender Jocelyn Sanchez 67 y.o. female  Code Status    Code Status Orders  (From admission, onward)           Start     Ordered   11/08/22 1626  Do not attempt resuscitation (DNR)  Continuous       Question Answer Comment  If patient has no pulse and is not breathing Do Not Attempt Resuscitation   If patient has a pulse and/or is breathing: Medical Treatment Goals MEDICAL INTERVENTIONS DESIRED: Use advanced airway interventions, mechanical ventilation or cardioversion in appropriate circumstances; Use medication/IV fluids as indicated; Provide comfort medications; Transfer to Progressive/Stepdown/ICU as indicated.   Consent: Discussion documented in EHR or advanced directives reviewed      11/08/22 1625           Code Status History     Date Active Date Inactive Code Status Order ID Comments User Context   11/08/2022 1615 11/08/2022 1625 Full Code [id number]  Nolberto Hanlon, MD ED   08/06/2020 1746 08/12/2020 2316 DNR [id number]  Luciano Cutter, MD ED   07/26/2019 2218 08/03/2019 2215 Full Code [id number]  Myrtie Neither, MD Inpatient   03/27/2019 2032 03/31/2019 1804 DNR [id number]  Leslye Peer, MD ED   02/10/2019 1641 02/15/2019 1512 Full Code [id number]  Tomma Lightning, MD ED   08/08/2017 0202 08/17/2017 1910 Partial Code [id number]  Gigi Gin, MD ED   08/08/2017 0119 08/08/2017 0202 Full Code [id number]  Hammonds, Curt Jews, MD ED   07/12/2016 0008 07/14/2016 2200 Partial Code [id number]  Orpah Cobb, MD ED   10/26/2015 2110 10/31/2015 1944 Full Code [id number]  Rinaldo Cloud, MD Inpatient   05/10/2015 1902 05/13/2015 1729 Full Code [id number]  Orpah Cobb, MD Inpatient   03/01/2015 2129 03/12/2015 1940 Full Code [id number]  Elease Etienne, MD Inpatient   11/28/2014 1639 12/04/2014 1631 Full Code [id number]  Rinaldo Cloud, MD Inpatient   03/01/2012 1354 03/11/2012 2037 Full Code [id number]  Vivien Rossetti, RN Inpatient    12/17/2011 1557 12/23/2011 1653 Full Code [id number]  Orlando Penner, RN Inpatient   05/11/2011 0303 05/13/2011 1749 DNR [id number]  Lyndel Pleasure, RN Inpatient       Home/SNF/Other Home  Chief Complaint Anemia [D64.9]  Level of Care/Admitting Diagnosis ED Disposition     ED Disposition  Admit   Condition  --   Comment  Hospital Area: [facility] [[id number]]  Level of Care: Progressive [102]  Admit to Progressive based on following criteria: CARDIOVASCULAR & THORACIC of moderate stability with acute coronary syndrome symptoms/low risk myocardial infarction/hypertensive urgency/arrhythmias/heart failure potentially compromising stability and stable post cardiovascular intervention patients.  May admit patient to [facility] or [facility] if equivalent level of care is available:: Yes  Covid Evaluation: Asymptomatic - no recent exposure (last 10 days) testing not required  Diagnosis: Anemia [242168]  Admitting Physician: Nolberto Hanlon [[id number]]  Attending Physician: Nolberto Hanlon [[id number]]  Certification:: I certify this patient will need inpatient services for at least 2 midnights  Estimated Length of Stay: 3          Medical History Past Medical History:  Diagnosis Date   Anemia    DM II (diabetes mellitus, type II), controlled (HCC)    Gait disorder    HTN (hypertension)    Lymphedema    MS (multiple sclerosis) (HCC)    Pulmonary embolism (HCC)     Allergies Allergies  Allergen Reactions  Sulfa Antibiotics Shortness Of Breath and Swelling   Chlorhexidine     IV Location/Drains/Wounds Patient Lines/Drains/Airways Status     Active Line/Drains/Airways     Name Placement date Placement time Site Days   Peripheral IV 11/08/22 20 G Anterior;Right External jugular 11/08/22  1235  External jugular  less than 1   Peripheral IV 11/08/22 20 G 1.75" Anterior;Distal;Right;Upper Arm 11/08/22  1621  Arm  less than 1   External Urinary Catheter  08/01/19  1410  --  1195   External Urinary Catheter 08/06/20  1230  --  824   Pressure Injury 08/06/20 Sacrum Mid Stage III -  Full thickness tissue loss. Subcutaneous fat may be visible but bone, tendon or muscle are NOT exposed. healing , not coplately healed 08/06/20  2304  -- 824   Pressure Injury 08/06/20 Heel Left Stage 2 -  Partial thickness loss of dermis presenting as a shallow open injury with a red, pink wound bed without slough. 08/06/20  2306  -- 824            Labs/Imaging Results for orders placed or performed during the hospital encounter of 11/08/22 (from the past 48 hour(s))  Lactic acid, plasma     Status: None   Collection Time: 11/08/22 12:16 PM  Result Value Ref Range   Lactic Acid, Venous 0.5 0.5 - 1.9 mmol/L    Comment: Performed at [facility], [address] [place], [place] [address]  Comprehensive metabolic panel     Status: Abnormal   Collection Time: 11/08/22 12:16 PM  Result Value Ref Range   Sodium 128 (L) 135 - 145 mmol/L   Potassium 3.4 (L) 3.5 - 5.1 mmol/L   Chloride 107 98 - 111 mmol/L   CO2 8 (L) 22 - 32 mmol/L   Glucose, Bld 81 70 - 99 mg/dL    Comment: Glucose reference range applies only to samples taken after fasting for at least 8 hours.   BUN 51 (H) 8 - 23 mg/dL   Creatinine, Ser 6.04 (H) 0.44 - 1.00 mg/dL   Calcium 8.3 (L) 8.9 - 10.3 mg/dL   Total Protein 7.5 6.5 - 8.1 g/dL   Albumin 2.9 (L) 3.5 - 5.0 g/dL   AST 5 (L) 15 - 41 U/L   ALT 7 0 - 44 U/L   Alkaline Phosphatase 82 38 - 126 U/L   Total Bilirubin 0.9 0.3 - 1.2 mg/dL   GFR, Estimated 55 (L) >60 mL/min    Comment: (NOTE) Calculated using the CKD-EPI Creatinine Equation (2021)    Anion gap 13 5 - 15    Comment: Performed at [facility], [address] [place], [place] [address]  CBC with Differential     Status: Abnormal   Collection Time: 11/08/22 12:16 PM  Result Value Ref Range   WBC 10.0 4.0 - 10.5 K/uL   RBC 2.36 (L) 3.87 -  5.11 MIL/uL   Hemoglobin 3.6 (LL) 12.0 - 15.0 g/dL    Comment: REPEATED TO VERIFY Reticulocyte Hemoglobin testing may be clinically indicated, consider ordering this additional test [id number] THIS CRITICAL RESULT HAS VERIFIED AND BEEN CALLED TO RN L SINCLAIR BY ALEXIS CRUICKSHANK ON 05 22 2024 AT 1319, AND HAS BEEN READ BACK.     HCT 15.7 (L) 36.0 - 46.0 %   MCV 66.5 (L) 80.0 - 100.0 fL   MCH 15.3 (L) 26.0 - 34.0 pg   MCHC 22.9 (L) 30.0 - 36.0 g/dL  RDW 27.6 (H) 11.5 - 15.5 %   Platelets 784 (H) 150 - 400 K/uL   nRBC 1.5 (H) 0.0 - 0.2 %   Neutrophils Relative % 77 %   Neutro Abs 7.7 1.7 - 7.7 K/uL   Lymphocytes Relative 15 %   Lymphs Abs 1.5 0.7 - 4.0 K/uL   Monocytes Relative 6 %   Monocytes Absolute 0.6 0.1 - 1.0 K/uL   Eosinophils Relative 1 %   Eosinophils Absolute 0.1 0.0 - 0.5 K/uL   Basophils Relative 0 %   Basophils Absolute 0.0 0.0 - 0.1 K/uL   Immature Granulocytes 1 %   Abs Immature Granulocytes 0.08 (H) 0.00 - 0.07 K/uL   Schistocytes PRESENT    Tear Drop Cells PRESENT     Comment: Performed at [facility], [address], [place], [place] [address]  Protime-INR     Status: Abnormal   Collection Time: 11/08/22 12:16 PM  Result Value Ref Range   Prothrombin Time 26.9 (H) 11.4 - 15.2 seconds   INR 2.5 (H) 0.8 - 1.2    Comment: (NOTE) INR goal varies based on device and disease states. Performed at [facility], [address] [place], [place] [address]   Troponin I (High Sensitivity)     Status: None   Collection Time: 11/08/22 12:16 PM  Result Value Ref Range   Troponin I (High Sensitivity) 5 <18 ng/L    Comment: (NOTE) Elevated high sensitivity troponin I (hsTnI) values and significant  changes across serial measurements may suggest ACS but many other  chronic and acute conditions are known to elevate hsTnI results.  Refer to the "Links" section for chest pain algorithms and additional  guidance. Performed at  [facility], [address], [place], [place] [address]   Magnesium     Status: Abnormal   Collection Time: 11/08/22 12:16 PM  Result Value Ref Range   Magnesium 1.5 (L) 1.7 - 2.4 mg/dL    Comment: Performed at [facility], [address], [place], [place] [address]  Lipase, blood     Status: Abnormal   Collection Time: 11/08/22 12:16 PM  Result Value Ref Range   Lipase 88 (H) 11 - 51 U/L    Comment: Performed at [facility], [address], [place], [place] [address]  Dickie La 8, ED     Status: Abnormal   Collection Time: 11/08/22 12:39 PM  Result Value Ref Range   Sodium 131 (L) 135 - 145 mmol/L   Potassium 4.4 3.5 - 5.1 mmol/L   Chloride 110 98 - 111 mmol/L   BUN 61 (H) 8 - 23 mg/dL   Creatinine, Ser 6.96 (H) 0.44 - 1.00 mg/dL   Glucose, Bld 78 70 - 99 mg/dL    Comment: Glucose reference range applies only to samples taken after fasting for at least 8 hours.   Calcium, Ion 1.24 1.15 - 1.40 mmol/L   TCO2 10 (L) 22 - 32 mmol/L   Hemoglobin 5.4 (LL) 12.0 - 15.0 g/dL   HCT 29.5 (L) 28.4 - 13.2 %   Comment NOTIFIED PHYSICIAN   POC occult blood, ED Provider will collect     Status: None   Collection Time: 11/08/22  1:10 PM  Result Value Ref Range   Fecal Occult Bld NEGATIVE NEGATIVE  Type and screen [facility]     Status: None (Preliminary result)   Collection Time: 11/08/22  1:15 PM  Result  Value Ref Range   ABO/RH(D) O POS    Antibody Screen NEG    Sample Expiration 11/11/2022,2359    Unit Number [id number]    Blood Component Type RBC LR PHER1    Unit division 00    Status of Unit ISSUED    Transfusion Status OK TO TRANSFUSE    Crossmatch Result      Compatible Performed at [facility], [address], [place], [place] [address]    Unit Number [id number]    Blood Component Type RED CELLS,LR    Unit division 00    Status of Unit ALLOCATED    Transfusion Status  OK TO TRANSFUSE    Crossmatch Result Compatible    Unit Number [id number]    Blood Component Type RED CELLS,LR    Unit division 00    Status of Unit ALLOCATED    Transfusion Status OK TO TRANSFUSE    Crossmatch Result Compatible    Unit Number [id number]    Blood Component Type RED CELLS,LR    Unit division 00    Status of Unit ALLOCATED    Transfusion Status OK TO TRANSFUSE    Crossmatch Result Compatible   Urinalysis, w/ Reflex to Culture (Infection Suspected) -Urine, Clean Catch     Status: Abnormal   Collection Time: 11/08/22  1:57 PM  Result Value Ref Range   Specimen Source URINE, CLEAN CATCH    Color, Urine YELLOW YELLOW   APPearance TURBID (A) CLEAR   Specific Gravity, Urine 1.012 1.005 - 1.030   pH 6.0 5.0 - 8.0   Glucose, UA NEGATIVE NEGATIVE mg/dL   Hgb urine dipstick MODERATE (A) NEGATIVE   Bilirubin Urine NEGATIVE NEGATIVE   Ketones, ur 5 (A) NEGATIVE mg/dL   Protein, ur 595 (A) NEGATIVE mg/dL   Nitrite NEGATIVE NEGATIVE   Leukocytes,Ua LARGE (A) NEGATIVE   RBC / HPF >50 0 - 5 RBC/hpf   WBC, UA >50 0 - 5 WBC/hpf    Comment:        Reflex urine culture not performed if WBC <=10, OR if Squamous epithelial cells >5. If Squamous epithelial cells >5 suggest recollection.    Bacteria, UA MANY (A) NONE SEEN   Squamous Epithelial / HPF 0-5 0 - 5 /HPF   WBC Clumps PRESENT     Comment: Performed at [facility], [address], [place], [place] [address]  Prepare RBC (crossmatch)     Status: None   Collection Time: 11/08/22  2:00 PM  Result Value Ref Range   Order Confirmation      ORDER PROCESSED BY BLOOD BANK Performed at [facility], [address] [place], [place] [address]   Troponin I (High Sensitivity)     Status: None   Collection Time: 11/08/22  2:16 PM  Result Value Ref Range   Troponin I (High Sensitivity) 5 <18 ng/L    Comment: (NOTE) Elevated high sensitivity troponin I (hsTnI) values and significant   changes across serial measurements may suggest ACS but many other  chronic and acute conditions are known to elevate hsTnI results.  Refer to the "Links" section for chest pain algorithms and additional  guidance. Performed at [facility], [address] [place], [place] [address]    CT CHEST ABDOMEN PELVIS WO CONTRAST  Result Date: 11/08/2022 CLINICAL DATA:  Heartburn. Vomiting x1. Constipated. Hypotensive. Unintended weight loss. Multiple sclerosis. Diabetes. EXAM: CT CHEST, ABDOMEN AND PELVIS WITHOUT CONTRAST TECHNIQUE: Multidetector CT imaging  of the chest, abdomen and pelvis was performed following the standard protocol without IV contrast. RADIATION DOSE REDUCTION: This exam was performed according to the departmental dose-optimization program which includes automated exposure control, adjustment of the mA and/or kV according to patient size and/or use of iterative reconstruction technique. COMPARISON:  07/26/2019 abdominopelvic CT. Most recent chest CT 10/26/2015. FINDINGS: CT CHEST FINDINGS Cardiovascular: Multifactorial degradation, including lack of IV contrast, patient arm position, not raised above the head. Wire and lead artifacts. Aortic atherosclerosis. No aortic aneurysm. Tortuous thoracic aorta. Normal heart size, without pericardial effusion. Left main and 3 vessel coronary artery calcification. Mediastinum/Nodes: No mediastinal or hilar adenopathy, given limitations of unenhanced CT. Lungs/Pleura: No pleural fluid. Subpleural 3 mm right lower lobe pulmonary nodule on 57/6 can be presumed benign and do/does not warrant imaging follow-up per Fleischner criteria. Minimal dependent atelectasis in the left lower lobe. Musculoskeletal: Anasarca.  No acute osseous abnormality. CT ABDOMEN PELVIS FINDINGS Hepatobiliary: Artifact degradation continuing into the upper abdomen. Grossly normal noncontrast appearance of the liver, gallbladder. Pancreas: Mild pancreatic atrophy.  No duct dilatation or acute inflammation. Spleen: Normal in size, without focal abnormality. Adrenals/Urinary Tract: Normal adrenal glands. 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 The largest right-sided stone is in the pelvis a 2.7 cm on 63/2. Left-sided pelvic stone or conglomerate of stones measures 2.5 cm on 59/2. The stone burden is relatively similar to 07/26/2019 CT. Possible upper pole caliectasis bilaterally, suboptimally evaluated. No ureteric stones are identified. Both ureters are mildly prominent throughout. The bladder appears mildly thick walled but is underdistended. Stomach/Bowel: Apparent proximal stomach wall thickening including on 47/2. Large stool ball in the rectum. Normal terminal ileum and appendix.  Normal small bowel. Vascular/Lymphatic: Advanced aortic and branch vessel atherosclerosis. No abdominopelvic adenopathy. Reproductive: Uterine calcifications are likely due to underlying fibroids. No adnexal mass. Other: No significant free fluid. No free intraperitoneal air. Anasarca slightly asymmetric and greater on the left. Musculoskeletal: Soft tissue thickening within the subcutaneous fat superficial to the right sacroiliac joint on 89/2. Heterogeneous marrow density is likely due to osteopenia and possible renal osteodystrophy. Lumbosacral spondylosis. Trace L4-5 anterolisthesis. Convex left lumbar spine curvature is mild. IMPRESSION: 1. Multifactorial degradation, as detailed above. 2.  No acute process in the chest. 3. 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 Probable upper pole caliectasis bilaterally, suboptimally evaluated. 4. Apparent proximal gastric wall thickening could be due to underdistention. Given the history of weight loss, correlate with symptoms of gastritis or gastric neoplasm. Consider endoscopy. 5. Stool burden in the rectum suggests fecal impaction 6. Skin thickening superficial the right  sacroiliac joint could represent cellulitis or scarring. No well-defined ulcer. Consider physical exam correlation. 7. Coronary artery atherosclerosis. Aortic Atherosclerosis (ICD10-I70.0). Electronically Signed   By: Jeronimo Greaves M.D.   On: 11/08/2022 14:46   DG Chest Port 1 View  Result Date: 11/08/2022 CLINICAL DATA:  Questionable sepsis.  Evaluate for abnormality. EXAM: PORTABLE CHEST 1 VIEW COMPARISON:  Radiographs 08/10/2020 and 08/06/2020. FINDINGS: 1359 hours. The heart size and mediastinal contours are stable with mild aortic atherosclerosis. The lungs are clear. There is no pleural effusion or pneumothorax. No acute osseous findings are evident. There is a mild thoracic scoliosis. Telemetry leads overlie the chest. IMPRESSION: No evidence of active cardiopulmonary process. Aortic atherosclerosis. Electronically Signed   By: Carey Bullocks M.D.   On: 11/08/2022 14:41    Pending Labs [employer] (From admission, onward)     Start  Ordered   11/08/22 1357  Urine Culture  Once,   R        11/08/22 1357   11/08/22 1216  Lactic acid, plasma  (Undifferentiated presentation (screening labs and basic nursing orders))  STAT Now then every 2 hours,   R (with STAT occurrences)      11/08/22 1215   11/08/22 1216  Blood Culture (routine x 2)  (Undifferentiated presentation (screening labs and basic nursing orders))  BLOOD CULTURE X 2,   STAT      11/08/22 1215   Signed and Held  HIV Antibody (routine testing w rflx)  (HIV Antibody (Routine testing w reflex) panel)  Once,   R        Signed and Held   Signed and Held  Basic metabolic panel  Tomorrow morning,   R        Signed and Held   Signed and Held  CBC  Tomorrow morning,   R        Signed and Held   Signed and Held  CBC  Once,   R       Comments: In between units of blood please.    Signed and Held            Vitals/Pain Today's Vitals   11/08/22 1453 11/08/22 1455 11/08/22 1517 11/08/22 1600  BP: (!) 79/53  (!) 74/55 (!)  74/52  Pulse: 97  92 94  Resp: 18  18 12   Temp:  (!) 97.4 F (36.3 C) 98 F (36.7 C)   TempSrc:   Axillary   SpO2: 100%   100%  Weight:      Height:      PainSc:        Isolation Precautions No active isolations  Medications Medications  0.9 %  sodium chloride infusion (has no administration in time range)  sodium chloride 0.9 % bolus 1,000 mL (1,000 mLs Intravenous New Bag/Given 11/08/22 1622)  ceFEPIme (MAXIPIME) 2 g in sodium chloride 0.9 % 100 mL IVPB (2 g Intravenous New Bag/Given 11/08/22 1622)  metroNIDAZOLE (FLAGYL) IVPB 500 mg (has no administration in time range)  vancomycin (VANCOREADY) IVPB 1500 mg/300 mL (has no administration in time range)  potassium chloride (KLOR-CON) packet 40 mEq (has no administration in time range)  albuterol (PROVENTIL) (2.5 MG/3ML) 0.083% nebulizer solution (has no administration in time range)  lactated ringers bolus 1,000 mL (0 mLs Intravenous Stopped 11/08/22 1436)  0.9 %  sodium chloride infusion (Manually program via Guardrails IV Fluids) ( Intravenous New Bag/Given 11/08/22 1438)  magnesium sulfate IVPB 2 g 50 mL (0 g Intravenous Paused 11/08/22 1623)    Mobility non-ambulatory

## 2022-11-08 NOTE — Progress Notes (Signed)
Code status discussion.   Patient was initially encountered with hypotension and , I believe unable to participate in detailed disucssion of code status. Therefore default full code order was placed as there was no signed documentation in chart of code status.   Patient revalauted, blood pressure has improved with fluds and patiet now more interactive. Dr. Vassie Loll of PCCM also at bedside. Per discussion, patient wishes to continue dnr status as before. Will change order.  Full h&p to follow.

## 2022-11-08 NOTE — Progress Notes (Signed)
PHARMACY -  BRIEF ANTIBIOTIC NOTE   Pharmacy has received consult(s) for cefepime and vancomycin from an ED provider.  The patient's profile has been reviewed for ht/wt/allergies/indication/available labs.    One time order(s) placed for cefepime 2 g + vancomycin 1500 mg  Further antibiotics/pharmacy consults should be ordered by admitting physician if indicated.                       Thank you,  Pricilla Riffle, PharmD, BCPS Clinical Pharmacist 11/08/2022 3:03 PM

## 2022-11-08 NOTE — Assessment & Plan Note (Signed)
Ending of suspected urinary tract infection as well as wound infection as well as hypotension.  Follow-up urine sodium and creatinine.  Likely prerenal.  Trend.

## 2022-11-08 NOTE — Assessment & Plan Note (Signed)
Developed in the emergency room after initial evaluation.  Troponin is negative.  Patient is already anticoagulated therefore VTE is unlikely.  This is most consistent with either hypovolemia given patient has AKI and reported vomiting versus sepsis related hypotension.  Fortunately patient has responded well in terms of clinically as well as blood pressure with IV fluid infusion.  We will continue same at this time

## 2022-11-08 NOTE — Assessment & Plan Note (Signed)
Did I do not believe this is acutely symptomatic.  I will monitor the BMP frequently.  At this time given that patient is felt to be hypovolemic we have to give patient IV fluids.  I will check serum osmolality and urine sodium.

## 2022-11-08 NOTE — Progress Notes (Signed)
Home med rec done. Holding coreg, entresto and eliquis till vital stability is achieved.

## 2022-11-08 NOTE — Assessment & Plan Note (Signed)
I believe that this wound is actually infected at this time.  Therefore I agree with blood cultures.  Patient has been started on vancomycin and cefepime.  I will continue with vancomycin and Zosyn.  Wound care eval requested.  Thankfully CAT scan of the abdomen pelvis does not report any abscess

## 2022-11-08 NOTE — Assessment & Plan Note (Signed)
Dental finding on CAT scan.  At this time defer further workup of this till patient is vitally stabilized

## 2022-11-08 NOTE — Assessment & Plan Note (Signed)
Was disimpacted in the ER.  I will order oral laxatives at this time

## 2022-11-08 NOTE — ED Notes (Signed)
Consent signed for blood. 

## 2022-11-08 NOTE — Assessment & Plan Note (Addendum)
Severe, at this time it is unclear as to what would cause such severe anemia to develop in this patient.  Working diagnosis is that patient has had chronic bleeding from her wound.  At this time wound evaluation does not show active bleeding thankfully.  I will order anemia workup for this patient. Ordered for 4 unit prbc

## 2022-11-08 NOTE — ED Notes (Signed)
Pt arrived via EMS, from home. C/o heart burn, vomiting x1, has been constipated. Hypotensive in triage.

## 2022-11-08 NOTE — H&P (Signed)
History and Physical    Patient: Jocelyn Sanchez DOB: 02/16/1956 DOA: 11/08/2022 DOS: the patient was seen and examined on 11/08/2022 PCP: Issifu, Georgann Housekeeper, FNP  Patient coming from: Home  Chief Complaint:  Chief Complaint  Patient presents with   Chest Pain   HPI: Jocelyn Sanchez is a 67 y.o. female with medical history significant of multiple sclerosis with quadriparetic and bedbound status.  Patient has a known presacral ulceration.  Chronic.  Patient was in her usual state of health till earlier today when patient reports having a single episode of vomiting reason for patient coming to.  Patient does not report any abdominal pain.  Patient has been constipated for the last several days.  There is no rigors or fevers reported.  Patient's ER course is complicated by patient developing hypotension and slight somnolence.  Patient has received IV fluids with good response to blood pressure and patient is now mentating good.  Medical evaluation is sought.   And is found to have severe anemia in the ER.  There is no report of bleeding per patient especially because she is constipated no melena.  No nosebleeding no skin bruising. Review of Systems: As mentioned in the history of present illness. All other systems reviewed and are negative. Past Medical History:  Diagnosis Date   Anemia    DM II (diabetes mellitus, type II), controlled (HCC)    Gait disorder    HTN (hypertension)    Lymphedema    MS (multiple sclerosis) (HCC)    Pulmonary embolism (HCC)    Past Surgical History:  Procedure Laterality Date   ENTEROSCOPY N/A 08/10/2020   Procedure: ENTEROSCOPY;  Surgeon: Kerin Salen, MD;  Location: [facility];  Service: Gastroenterology;  Laterality: N/A;   ESOPHAGOGASTRODUODENOSCOPY N/A 07/12/2016   Procedure: ESOPHAGOGASTRODUODENOSCOPY (EGD);  Surgeon: Hilarie Fredrickson, MD;  Location: [facility];  Service: Endoscopy;  Laterality: N/A;   ESOPHAGOGASTRODUODENOSCOPY N/A  02/11/2019   Procedure: ESOPHAGOGASTRODUODENOSCOPY (EGD);  Surgeon: Jeani Hawking, MD;  Location: [facility];  Service: Endoscopy;  Laterality: N/A;   FLEXIBLE SIGMOIDOSCOPY Left 03/09/2015   Procedure: FLEXIBLE SIGMOIDOSCOPY;  Surgeon: Jeani Hawking, MD;  Location: [facility];  Service: Endoscopy;  Laterality: Left;   HOT HEMOSTASIS N/A 02/11/2019   Procedure: HOT HEMOSTASIS (ARGON PLASMA COAGULATION/BICAP);  Surgeon: Jeani Hawking, MD;  Location: [facility];  Service: Endoscopy;  Laterality: N/A;   HOT HEMOSTASIS N/A 08/10/2020   Procedure: HOT HEMOSTASIS (ARGON PLASMA COAGULATION/BICAP);  Surgeon: Kerin Salen, MD;  Location: [facility];  Service: Gastroenterology;  Laterality: N/A;   Social History:  reports that she has quit smoking. Her smoking use included cigarettes. She has never used smokeless tobacco. She reports that she does not drink alcohol and does not use drugs.  Allergies  Allergen Reactions   Sulfa Antibiotics Shortness Of Breath and Swelling   Chlorhexidine     Family History  Problem Relation Age of Onset   Multiple sclerosis Mother     Prior to Admission medications   Medication Sig Start Date End Date Taking? Authorizing Provider  acetaminophen (TYLENOL) 500 MG tablet Take 1,000 mg by mouth 2 (two) times daily.    [provider]  Ascorbic Acid (VITAMIN C) 1000 MG tablet Take 1,000 mg by mouth daily. 08/02/20   [provider]  carvedilol (COREG) 6.25 MG tablet Take 6.25 mg by mouth 2 (two) times daily. 12/21/18   [provider]  [employer] D3 50 MCG (2000 UT) CAPS Take 2,000 Units by mouth daily.  08/02/20   [provider]  ELIQUIS 5 MG TABS tablet Take 5 mg by mouth 2 (two) times daily. 03/11/19   [provider]  ENTRESTO 24-26 MG Take 1 tablet by mouth 2 (two) times daily. 03/13/19   [provider]  guaifenesin (ROBITUSSIN) 100 MG/5ML syrup Take 200 mg by mouth 3 (three) times daily as needed for cough.    [provider]  lactulose (CHRONULAC) 10 GM/15ML solution Take 30 mLs by mouth daily as needed for constipation. 07/17/20   [provider]  liver oil-zinc oxide (DESITIN) 40 % ointment Apply 1 application topically as needed for irritation. 08/12/20   Briant Cedar, MD  metFORMIN (GLUCOPHAGE) 500 MG tablet Take 500 mg by mouth 2 (two) times daily. 07/25/19   [provider]  methocarbamol (ROBAXIN) 500 MG tablet Take 750 mg by mouth 2 (two) times daily. 06/27/20   [provider]  omeprazole (PRILOSEC) 20 MG capsule Take 1 capsule (20 mg total) by mouth 2 (two) times daily before a meal. 08/12/20 09/11/20  Briant Cedar, MD  traZODone (DESYREL) 100 MG tablet Take 100 mg by mouth at bedtime. 06/04/20   [provider]  Zinc Sulfate 220 (50 Zn) MG TABS Take 220 mg by mouth daily. 08/02/20   [provider]    Physical Exam: Vitals:   11/08/22 1455 11/08/22 1517 11/08/22 1600 11/08/22 1630  BP:  (!) 74/55 (!) 74/52 107/61  Pulse:  92 94 94  Resp:  18 12 11   Temp: (!) 97.4 F (36.3 C) 98 F (36.7 C)    TempSrc:  Axillary    SpO2:   100% 100%  Weight:      Height:       General: Patient when initially encountered had a more somnolent status.  However was reevaluated and is now much more awake and interactive and gives a coherent account of the day.  Does not appear to be in any distress Neurologic exam: Patient has quadriparesis with extensor posturing of lower extremity and flexor posturing of upper extremity increased quadriceps tone.  Patient does not follow directions with all 4 extremities Cardiovascular exam no jugular venous distention S1-S2 normal respiratory exam: Bilateral air entry vesicular limited excursion Abdomen: Bowel sounds normal all quadrants soft nontender  Skin: There is a foul-smelling presacral ulcer with tunneling under the skin.  No discharge is noted at this time.  Please note that the patient is dark skinned and I  believe there is some erythema spreading circumferentially around the wound at least 2 cm away.    Media Information  Document Information  Photos    11/08/2022 12:55  Attached To:  Hospital Encounter on 11/08/22  Source Information  Gloris Manchester, MD  [facility]   Data Reviewed:  Labs on Admission:  Results for orders placed or performed during the hospital encounter of 11/08/22 (from the past 24 hour(s))  Lactic acid, plasma     Status: None   Collection Time: 11/08/22 12:16 PM  Result Value Ref Range   Lactic Acid, Venous 0.5 0.5 - 1.9 mmol/L  Comprehensive metabolic panel     Status: Abnormal   Collection Time: 11/08/22 12:16 PM  Result Value Ref Range   Sodium 128 (L) 135 - 145 mmol/L   Potassium 3.4 (L) 3.5 - 5.1 mmol/L   Chloride 107 98 - 111 mmol/L   CO2 8 (L) 22 - 32 mmol/L   Glucose, Bld 81 70 - 99 mg/dL  BUN 51 (H) 8 - 23 mg/dL   Creatinine, Ser 1.61 (H) 0.44 - 1.00 mg/dL   Calcium 8.3 (L) 8.9 - 10.3 mg/dL   Total Protein 7.5 6.5 - 8.1 g/dL   Albumin 2.9 (L) 3.5 - 5.0 g/dL   AST 5 (L) 15 - 41 U/L   ALT 7 0 - 44 U/L   Alkaline Phosphatase 82 38 - 126 U/L   Total Bilirubin 0.9 0.3 - 1.2 mg/dL   GFR, Estimated 55 (L) >60 mL/min   Anion gap 13 5 - 15  CBC with Differential     Status: Abnormal   Collection Time: 11/08/22 12:16 PM  Result Value Ref Range   WBC 10.0 4.0 - 10.5 K/uL   RBC 2.36 (L) 3.87 - 5.11 MIL/uL   Hemoglobin 3.6 (LL) 12.0 - 15.0 g/dL   HCT 09.6 (L) 04.5 - 40.9 %   MCV 66.5 (L) 80.0 - 100.0 fL   MCH 15.3 (L) 26.0 - 34.0 pg   MCHC 22.9 (L) 30.0 - 36.0 g/dL   RDW 81.1 (H) 91.4 - 78.2 %   Platelets 784 (H) 150 - 400 K/uL   nRBC 1.5 (H) 0.0 - 0.2 %   Neutrophils Relative % 77 %   Neutro Abs 7.7 1.7 - 7.7 K/uL   Lymphocytes Relative 15 %   Lymphs Abs 1.5 0.7 - 4.0 K/uL   Monocytes Relative 6 %   Monocytes Absolute 0.6 0.1 - 1.0 K/uL   Eosinophils Relative 1 %   Eosinophils Absolute 0.1 0.0 - 0.5 K/uL   Basophils Relative 0 %    Basophils Absolute 0.0 0.0 - 0.1 K/uL   Immature Granulocytes 1 %   Abs Immature Granulocytes 0.08 (H) 0.00 - 0.07 K/uL   Schistocytes PRESENT    Tear Drop Cells PRESENT   Protime-INR     Status: Abnormal   Collection Time: 11/08/22 12:16 PM  Result Value Ref Range   Prothrombin Time 26.9 (H) 11.4 - 15.2 seconds   INR 2.5 (H) 0.8 - 1.2  Troponin I (High Sensitivity)     Status: None   Collection Time: 11/08/22 12:16 PM  Result Value Ref Range   Troponin I (High Sensitivity) 5 <18 ng/L  Magnesium     Status: Abnormal   Collection Time: 11/08/22 12:16 PM  Result Value Ref Range   Magnesium 1.5 (L) 1.7 - 2.4 mg/dL  Lipase, blood     Status: Abnormal   Collection Time: 11/08/22 12:16 PM  Result Value Ref Range   Lipase 88 (H) 11 - 51 U/L  I-stat chem 8, ED     Status: Abnormal   Collection Time: 11/08/22 12:39 PM  Result Value Ref Range   Sodium 131 (L) 135 - 145 mmol/L   Potassium 4.4 3.5 - 5.1 mmol/L   Chloride 110 98 - 111 mmol/L   BUN 61 (H) 8 - 23 mg/dL   Creatinine, Ser 9.56 (H) 0.44 - 1.00 mg/dL   Glucose, Bld 78 70 - 99 mg/dL   Calcium, Ion 2.13 0.86 - 1.40 mmol/L   TCO2 10 (L) 22 - 32 mmol/L   Hemoglobin 5.4 (LL) 12.0 - 15.0 g/dL   HCT 57.8 (L) 46.9 - 62.9 %   Comment NOTIFIED PHYSICIAN   POC occult blood, ED Provider will collect     Status: None   Collection Time: 11/08/22  1:10 PM  Result Value Ref Range   Fecal Occult Bld NEGATIVE NEGATIVE  Type and screen [facility]     Status: None (Preliminary result)   Collection Time: 11/08/22  1:15 PM  Result Value Ref Range   ABO/RH(D) O POS    Antibody Screen NEG    Sample Expiration 11/11/2022,2359    Unit Number [id number]    Blood Component Type RBC LR PHER1    Unit division 00    Status of Unit ISSUED    Transfusion Status OK TO TRANSFUSE    Crossmatch Result      Compatible Performed at [facility], [address], [place], [place] [address]    Unit Number  [id number]    Blood Component Type RED CELLS,LR    Unit division 00    Status of Unit ALLOCATED    Transfusion Status OK TO TRANSFUSE    Crossmatch Result Compatible    Unit Number [id number]    Blood Component Type RED CELLS,LR    Unit division 00    Status of Unit ALLOCATED    Transfusion Status OK TO TRANSFUSE    Crossmatch Result Compatible    Unit Number [id number]    Blood Component Type RED CELLS,LR    Unit division 00    Status of Unit ALLOCATED    Transfusion Status OK TO TRANSFUSE    Crossmatch Result Compatible   Urinalysis, w/ Reflex to Culture (Infection Suspected) -Urine, Clean Catch     Status: Abnormal   Collection Time: 11/08/22  1:57 PM  Result Value Ref Range   Specimen Source URINE, CLEAN CATCH    Color, Urine YELLOW YELLOW   APPearance TURBID (A) CLEAR   Specific Gravity, Urine 1.012 1.005 - 1.030   pH 6.0 5.0 - 8.0   Glucose, UA NEGATIVE NEGATIVE mg/dL   Hgb urine dipstick MODERATE (A) NEGATIVE   Bilirubin Urine NEGATIVE NEGATIVE   Ketones, ur 5 (A) NEGATIVE mg/dL   Protein, ur 644 (A) NEGATIVE mg/dL   Nitrite NEGATIVE NEGATIVE   Leukocytes,Ua LARGE (A) NEGATIVE   RBC / HPF >50 0 - 5 RBC/hpf   WBC, UA >50 0 - 5 WBC/hpf   Bacteria, UA MANY (A) NONE SEEN   Squamous Epithelial / HPF 0-5 0 - 5 /HPF   WBC Clumps PRESENT   Prepare RBC (crossmatch)     Status: None   Collection Time: 11/08/22  2:00 PM  Result Value Ref Range   Order Confirmation      ORDER PROCESSED BY BLOOD BANK Performed at [facility], [address], [place], [place] [address]   Troponin I (High Sensitivity)     Status: None   Collection Time: 11/08/22  2:16 PM  Result Value Ref Range   Troponin I (High Sensitivity) 5 <18 ng/L   Basic Metabolic Panel: Recent Labs  Lab 11/08/22 1216 11/08/22 1239  NA 128* 131*  K 3.4* 4.4  CL 107 110  CO2 8*  --   GLUCOSE 81 78  BUN 51* 61*  CREATININE 1.10* 1.10*  CALCIUM 8.3*  --   MG 1.5*  --    Liver  Function Tests: Recent Labs  Lab 11/08/22 1216  AST 5*  ALT 7  ALKPHOS 82  BILITOT 0.9  PROT 7.5  ALBUMIN 2.9*   Recent Labs  Lab 11/08/22 1216  LIPASE 88*   No results for input(s): "AMMONIA" in the last 168 hours. CBC: Recent Labs  Lab 11/08/22 1216 11/08/22 1239  WBC 10.0  --   NEUTROABS 7.7  --   HGB 3.6* 5.4*  HCT 15.7* 16.0*  MCV 66.5*  --   PLT 784*  --    Cardiac Enzymes: Recent Labs  Lab 11/08/22 1216 11/08/22 1416  TROPONINIHS 5 5    BNP (last 3 results) No results for input(s): "PROBNP" in the last 8760 hours. CBG: No results for input(s): "GLUCAP" in the last 168 hours.  Radiological Exams on Admission:  CT CHEST ABDOMEN PELVIS WO CONTRAST  Result Date: 11/08/2022 CLINICAL DATA:  Heartburn. Vomiting x1. Constipated. Hypotensive. Unintended weight loss. Multiple sclerosis. Diabetes. EXAM: CT CHEST, ABDOMEN AND PELVIS WITHOUT CONTRAST TECHNIQUE: Multidetector CT imaging of the chest, abdomen and pelvis was performed following the standard protocol without IV contrast. RADIATION DOSE REDUCTION: This exam was performed according to the departmental dose-optimization program which includes automated exposure control, adjustment of the mA and/or kV according to patient size and/or use of iterative reconstruction technique. COMPARISON:  07/26/2019 abdominopelvic CT. Most recent chest CT 10/26/2015. FINDINGS: CT CHEST FINDINGS Cardiovascular: Multifactorial degradation, including lack of IV contrast, patient arm position, not raised above the head. Wire and lead artifacts. Aortic atherosclerosis. No aortic aneurysm. Tortuous thoracic aorta. Normal heart size, without pericardial effusion. Left main and 3 vessel coronary artery calcification. Mediastinum/Nodes: No mediastinal or hilar adenopathy, given limitations of unenhanced CT. Lungs/Pleura: No pleural fluid. Subpleural 3 mm right lower lobe pulmonary nodule on 57/6 can be presumed benign and do/does not warrant  imaging follow-up per Fleischner criteria. Minimal dependent atelectasis in the left lower lobe. Musculoskeletal: Anasarca.  No acute osseous abnormality. CT ABDOMEN PELVIS FINDINGS Hepatobiliary: Artifact degradation continuing into the upper abdomen. Grossly normal noncontrast appearance of the liver, gallbladder. Pancreas: Mild pancreatic atrophy. No duct dilatation or acute inflammation. Spleen: Normal in size, without focal abnormality. Adrenals/Urinary Tract: Normal adrenal glands. 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 The largest right-sided stone is in the pelvis a 2.7 cm on 63/2. Left-sided pelvic stone or conglomerate of stones measures 2.5 cm on 59/2. The stone burden is relatively similar to 07/26/2019 CT. Possible upper pole caliectasis bilaterally, suboptimally evaluated. No ureteric stones are identified. Both ureters are mildly prominent throughout. The bladder appears mildly thick walled but is underdistended. Stomach/Bowel: Apparent proximal stomach wall thickening including on 47/2. Large stool ball in the rectum. Normal terminal ileum and appendix.  Normal small bowel. Vascular/Lymphatic: Advanced aortic and branch vessel atherosclerosis. No abdominopelvic adenopathy. Reproductive: Uterine calcifications are likely due to underlying fibroids. No adnexal mass. Other: No significant free fluid. No free intraperitoneal air. Anasarca slightly asymmetric and greater on the left. Musculoskeletal: Soft tissue thickening within the subcutaneous fat superficial to the right sacroiliac joint on 89/2. Heterogeneous marrow density is likely due to osteopenia and possible renal osteodystrophy. Lumbosacral spondylosis. Trace L4-5 anterolisthesis. Convex left lumbar spine curvature is mild. IMPRESSION: 1. Multifactorial degradation, as detailed above. 2.  No acute process in the chest. 3. 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  Probable upper pole caliectasis bilaterally, suboptimally evaluated. 4. Apparent proximal gastric wall thickening could be due to underdistention. Given the history of weight loss, correlate with symptoms of gastritis or gastric neoplasm. Consider endoscopy. 5. Stool burden in the rectum suggests fecal impaction 6. Skin thickening superficial the right sacroiliac joint could represent cellulitis or scarring. No well-defined ulcer. Consider physical exam correlation. 7. Coronary artery atherosclerosis. Aortic Atherosclerosis (ICD10-I70.0). Electronically Signed   By: Jeronimo Greaves M.D.   On: 11/08/2022 14:46   DG Chest Port 1 View  Result Date: 11/08/2022 CLINICAL DATA:  Questionable sepsis.  Evaluate for abnormality. EXAM: PORTABLE CHEST 1 VIEW COMPARISON:  Radiographs 08/10/2020 and 08/06/2020. FINDINGS: 1359 hours. The heart size and mediastinal contours are stable with mild aortic atherosclerosis. The lungs are clear. There is no pleural effusion or pneumothorax. No acute osseous findings are evident. There is a mild thoracic scoliosis. Telemetry leads overlie the chest. IMPRESSION: No evidence of active cardiopulmonary process. Aortic atherosclerosis. Electronically Signed   By: Carey Bullocks M.D.   On: 11/08/2022 14:41    EKG: Independently reviewed. Sinus rhythm. No ischemic changes.   Assessment and Plan: * Anemia Severe, at this time it is unclear as to what would cause such severe anemia to develop in this patient.  Working diagnosis is that patient has had chronic bleeding from her wound.  At this time wound evaluation does not show active bleeding thankfully.  I will order anemia workup for this patient. Ordered for 4 unit prbc  Hyponatremia Did I do not believe this is acutely symptomatic.  I will monitor the BMP frequently.  At this time given that patient is felt to be hypovolemic we have to give patient IV fluids.  I will check serum osmolality and urine sodium.  Constipation Was  disimpacted in the ER.  I will order oral laxatives at this time  H/O renal calculi This is chronic.  Current CAT scan confirms renal calculi.  This is a focus of infection for patient.  Does have UTI based on urinalysis.  Please follow-up urine cultures  Gastric wall thickening Dental finding on CAT scan.  At this time defer further workup of this till patient is vitally stabilized  Hypotension Developed in the emergency room after initial evaluation.  Troponin is negative.  Patient is already anticoagulated therefore VTE is unlikely.  This is most consistent with either hypovolemia given patient has AKI and reported vomiting versus sepsis related hypotension.  Fortunately patient has responded well in terms of clinically as well as blood pressure with IV fluid infusion.  We will continue same at this time  Sacral wound I believe that this wound is actually infected at this time.  Therefore I agree with blood cultures.  Patient has been started on vancomycin and cefepime.  I will continue with vancomycin and Zosyn.  Wound care eval requested.  Thankfully CAT scan of the abdomen pelvis does not report any abscess  AKI (acute kidney injury) (HCC) Ending of suspected urinary tract infection as well as wound infection as well as hypotension.  Follow-up urine sodium and creatinine.  Likely prerenal.  Trend.      Advance Care Planning:   Code Status: DNR given patient's alteration in mental status, initially full code order was placed as there is note signed a statement from patient regarding CODE STATUS.  However once patient woke up and was interacting more, patient advised that she wishes to be DNR in front of her son as well as pulmonary critical care attending.  Therefore this order is being placed.  Consults: PCCM input sought.  Family Communication: son at bedside  Severity of Illness: The appropriate patient status for this patient is INPATIENT. Inpatient status is judged to be  reasonable and necessary in order to provide the required intensity of service to ensure the patient's safety. The patient's presenting symptoms, physical exam findings, and initial radiographic and laboratory data in the context of their chronic comorbidities is felt to place them at high risk for further clinical deterioration. Furthermore, it is not anticipated that the patient  will be medically stable for discharge from the hospital within 2 midnights of admission.   * I certify that at the point of admission it is my clinical judgment that the patient will require inpatient hospital care spanning beyond 2 midnights from the point of admission due to high intensity of service, high risk for further deterioration and high frequency of surveillance required.*  Author: Nolberto Hanlon, MD 11/08/2022 4:47 PM  For on call review [email or web address].

## 2022-11-08 NOTE — ED Provider Notes (Signed)
[facility] AT [facility] Provider Note   CSN: [id number] Arrival date & time: 11/08/22  1135     History  Chief Complaint  Patient presents with   Chest Pain    Jocelyn Sanchez is a 67 y.o. female.   Chest Pain Associated symptoms: vomiting   Patient presents for chest pain and emesis.  Medical history includes decubitus ulcer, DM, anemia, PE, PVD, esophageal stricture, GERD, prior GI bleed, CHF, MS.  She is bedbound at baseline.  She arrives via EMS from home, where she has home health support.  EMS reports recent constipation, episode of emesis this morning, episode of chest pain this morning described as reflux-burning sensation.  Patient currently denies any areas of discomfort.  She denies any shortness of breath.  She states that her blood pressure typically runs low, in the range of 100 SBP.     Home Medications Prior to Admission medications   Medication Sig Start Date End Date Taking? Authorizing Provider  acetaminophen (TYLENOL) 500 MG tablet Take 1,000 mg by mouth 2 (two) times daily.    [provider]  Ascorbic Acid (VITAMIN C) 1000 MG tablet Take 1,000 mg by mouth daily. 08/02/20   [provider]  carvedilol (COREG) 6.25 MG tablet Take 6.25 mg by mouth 2 (two) times daily. 12/21/18   [provider]  [employer] D3 50 MCG (2000 UT) CAPS Take 2,000 Units by mouth daily. 08/02/20   [provider]  ELIQUIS 5 MG TABS tablet Take 5 mg by mouth 2 (two) times daily. 03/11/19   [provider]  ENTRESTO 24-26 MG Take 1 tablet by mouth 2 (two) times daily. 03/13/19   [provider]  guaifenesin (ROBITUSSIN) 100 MG/5ML syrup Take 200 mg by mouth 3 (three) times daily as needed for cough.    [provider]  lactulose (CHRONULAC) 10 GM/15ML solution Take 30 mLs by mouth daily as needed for constipation. 07/17/20   [provider]  liver oil-zinc oxide (DESITIN) 40 % ointment Apply 1  application topically as needed for irritation. 08/12/20   Briant Cedar, MD  metFORMIN (GLUCOPHAGE) 500 MG tablet Take 500 mg by mouth 2 (two) times daily. 07/25/19   [provider]  methocarbamol (ROBAXIN) 500 MG tablet Take 750 mg by mouth 2 (two) times daily. 06/27/20   [provider]  omeprazole (PRILOSEC) 20 MG capsule Take 1 capsule (20 mg total) by mouth 2 (two) times daily before a meal. 08/12/20 09/11/20  Briant Cedar, MD  traZODone (DESYREL) 100 MG tablet Take 100 mg by mouth at bedtime. 06/04/20   [provider]  Zinc Sulfate 220 (50 Zn) MG TABS Take 220 mg by mouth daily. 08/02/20   [provider]      Allergies    Sulfa antibiotics and Chlorhexidine    Review of Systems   Review of Systems  Cardiovascular:  Positive for chest pain.  Gastrointestinal:  Positive for constipation and vomiting.  All other systems reviewed and are negative.   Physical Exam Updated Vital Signs BP (!) 74/55   Pulse 92   Temp 98 F (36.7 C) (Axillary)   Resp 18   Ht 5\' 8"  (1.727 m)   Wt 67.1 kg   SpO2 100%   BMI 22.50 kg/m  Physical Exam Vitals and nursing note reviewed. Exam conducted with a chaperone present.  Constitutional:      General: She is not in acute distress.  Appearance: She is well-developed. She is ill-appearing (Chronically). She is not toxic-appearing.  HENT:     Head: Normocephalic and atraumatic.  Eyes:     Conjunctiva/sclera: Conjunctivae normal.  Cardiovascular:     Rate and Rhythm: Regular rhythm. Tachycardia present.     Heart sounds: No murmur heard. Pulmonary:     Effort: Pulmonary effort is normal. No tachypnea or respiratory distress.     Breath sounds: Normal breath sounds.  Chest:     Chest wall: No tenderness.  Abdominal:     Palpations: Abdomen is soft.     Tenderness: There is no abdominal tenderness.  Genitourinary:    Comments: Very large amount of impacted.  Chronic sacral  wound. Musculoskeletal:        General: No swelling.     Cervical back: Normal range of motion and neck supple.     Right lower leg: No edema.     Left lower leg: No edema.  Skin:    General: Skin is warm and dry.     Coloration: Skin is not cyanotic or pale.  Neurological:     Mental Status: She is alert and oriented to person, place, and time.     Comments: Chronic upper extremity contractures  Psychiatric:        Mood and Affect: Mood normal.        Behavior: Behavior normal.     ED Results / Procedures / Treatments   Labs (all labs ordered are listed, but only abnormal results are displayed) Labs Reviewed  COMPREHENSIVE METABOLIC PANEL - Abnormal; Notable for the following components:      Result Value   Sodium 128 (*)    Potassium 3.4 (*)    CO2 8 (*)    BUN 51 (*)    Creatinine, Ser 1.10 (*)    Calcium 8.3 (*)    Albumin 2.9 (*)    AST 5 (*)    GFR, Estimated 55 (*)    All other components within normal limits  CBC WITH DIFFERENTIAL/PLATELET - Abnormal; Notable for the following components:   RBC 2.36 (*)    Hemoglobin 3.6 (*)    HCT 15.7 (*)    MCV 66.5 (*)    MCH 15.3 (*)    MCHC 22.9 (*)    RDW 27.6 (*)    Platelets 784 (*)    nRBC 1.5 (*)    Abs Immature Granulocytes 0.08 (*)    All other components within normal limits  PROTIME-INR - Abnormal; Notable for the following components:   Prothrombin Time 26.9 (*)    INR 2.5 (*)    All other components within normal limits  URINALYSIS, W/ REFLEX TO CULTURE (INFECTION SUSPECTED) - Abnormal; Notable for the following components:   APPearance TURBID (*)    Hgb urine dipstick MODERATE (*)    Ketones, ur 5 (*)    Protein, ur 100 (*)    Leukocytes,Ua LARGE (*)    Bacteria, UA MANY (*)    All other components within normal limits  MAGNESIUM - Abnormal; Notable for the following components:   Magnesium 1.5 (*)    All other components within normal limits  LIPASE, BLOOD - Abnormal; Notable for the following  components:   Lipase 88 (*)    All other components within normal limits  I-STAT CHEM 8, ED - Abnormal; Notable for the following components:   Sodium 131 (*)    BUN 61 (*)    Creatinine, Ser 1.10 (*)  TCO2 10 (*)    Hemoglobin 5.4 (*)    HCT 16.0 (*)    All other components within normal limits  CULTURE, BLOOD (ROUTINE X 2)  CULTURE, BLOOD (ROUTINE X 2)  URINE CULTURE  LACTIC ACID, PLASMA  LACTIC ACID, PLASMA  POC OCCULT BLOOD, ED  TYPE AND SCREEN  PREPARE RBC (CROSSMATCH)  TROPONIN I (HIGH SENSITIVITY)  TROPONIN I (HIGH SENSITIVITY)    EKG EKG Interpretation  Date/Time:  Wednesday Nov 08 2022 12:06:13 EDT Ventricular Rate:  100 PR Interval:  160 QRS Duration: 94 QT Interval:  370 QTC Calculation: 478 R Axis:   -25 Text Interpretation: Sinus tachycardia Borderline left axis deviation Low voltage, precordial leads Abnormal R-wave progression, early transition Consider anterior infarct Nonspecific T abnormalities, lateral leads Confirmed by Gloris Manchester [phone]) on 11/08/2022 12:34:26 PM  Radiology CT CHEST ABDOMEN PELVIS WO CONTRAST  Result Date: 11/08/2022 CLINICAL DATA:  Heartburn. Vomiting x1. Constipated. Hypotensive. Unintended weight loss. Multiple sclerosis. Diabetes. EXAM: CT CHEST, ABDOMEN AND PELVIS WITHOUT CONTRAST TECHNIQUE: Multidetector CT imaging of the chest, abdomen and pelvis was performed following the standard protocol without IV contrast. RADIATION DOSE REDUCTION: This exam was performed according to the departmental dose-optimization program which includes automated exposure control, adjustment of the mA and/or kV according to patient size and/or use of iterative reconstruction technique. COMPARISON:  07/26/2019 abdominopelvic CT. Most recent chest CT 10/26/2015. FINDINGS: CT CHEST FINDINGS Cardiovascular: Multifactorial degradation, including lack of IV contrast, patient arm position, not raised above the head. Wire and lead artifacts. Aortic atherosclerosis.  No aortic aneurysm. Tortuous thoracic aorta. Normal heart size, without pericardial effusion. Left main and 3 vessel coronary artery calcification. Mediastinum/Nodes: No mediastinal or hilar adenopathy, given limitations of unenhanced CT. Lungs/Pleura: No pleural fluid. Subpleural 3 mm right lower lobe pulmonary nodule on 57/6 can be presumed benign and do/does not warrant imaging follow-up per Fleischner criteria. Minimal dependent atelectasis in the left lower lobe. Musculoskeletal: Anasarca.  No acute osseous abnormality. CT ABDOMEN PELVIS FINDINGS Hepatobiliary: Artifact degradation continuing into the upper abdomen. Grossly normal noncontrast appearance of the liver, gallbladder. Pancreas: Mild pancreatic atrophy. No duct dilatation or acute inflammation. Spleen: Normal in size, without focal abnormality. Adrenals/Urinary Tract: Normal adrenal glands. 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 The largest right-sided stone is in the pelvis a 2.7 cm on 63/2. Left-sided pelvic stone or conglomerate of stones measures 2.5 cm on 59/2. The stone burden is relatively similar to 07/26/2019 CT. Possible upper pole caliectasis bilaterally, suboptimally evaluated. No ureteric stones are identified. Both ureters are mildly prominent throughout. The bladder appears mildly thick walled but is underdistended. Stomach/Bowel: Apparent proximal stomach wall thickening including on 47/2. Large stool ball in the rectum. Normal terminal ileum and appendix.  Normal small bowel. Vascular/Lymphatic: Advanced aortic and branch vessel atherosclerosis. No abdominopelvic adenopathy. Reproductive: Uterine calcifications are likely due to underlying fibroids. No adnexal mass. Other: No significant free fluid. No free intraperitoneal air. Anasarca slightly asymmetric and greater on the left. Musculoskeletal: Soft tissue thickening within the subcutaneous fat superficial to the right sacroiliac joint on 89/2. Heterogeneous  marrow density is likely due to osteopenia and possible renal osteodystrophy. Lumbosacral spondylosis. Trace L4-5 anterolisthesis. Convex left lumbar spine curvature is mild. IMPRESSION: 1. Multifactorial degradation, as detailed above. 2.  No acute process in the chest. 3. 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 Probable upper pole caliectasis bilaterally, suboptimally evaluated. 4. Apparent proximal gastric wall thickening could be due to underdistention.  Given the history of weight loss, correlate with symptoms of gastritis or gastric neoplasm. Consider endoscopy. 5. Stool burden in the rectum suggests fecal impaction 6. Skin thickening superficial the right sacroiliac joint could represent cellulitis or scarring. No well-defined ulcer. Consider physical exam correlation. 7. Coronary artery atherosclerosis. Aortic Atherosclerosis (ICD10-I70.0). Electronically Signed   By: Jeronimo Greaves M.D.   On: 11/08/2022 14:46   DG Chest Port 1 View  Result Date: 11/08/2022 CLINICAL DATA:  Questionable sepsis.  Evaluate for abnormality. EXAM: PORTABLE CHEST 1 VIEW COMPARISON:  Radiographs 08/10/2020 and 08/06/2020. FINDINGS: 1359 hours. The heart size and mediastinal contours are stable with mild aortic atherosclerosis. The lungs are clear. There is no pleural effusion or pneumothorax. No acute osseous findings are evident. There is a mild thoracic scoliosis. Telemetry leads overlie the chest. IMPRESSION: No evidence of active cardiopulmonary process. Aortic atherosclerosis. Electronically Signed   By: Carey Bullocks M.D.   On: 11/08/2022 14:41    Procedures Fecal disimpaction  Date/Time: 11/08/2022 1:05 PM  Performed by: Gloris Manchester, MD Authorized by: Gloris Manchester, MD  Consent: Verbal consent obtained. Risks and benefits: risks, benefits and alternatives were discussed Consent given by: patient Patient understanding: patient states understanding of the  procedure being performed Patient identity confirmed: verbally with patient Local anesthesia used: no  Anesthesia: Local anesthesia used: no  Sedation: Patient sedated: no  Patient tolerance: patient tolerated the procedure well with no immediate complications       Medications Ordered in ED Medications  0.9 %  sodium chloride infusion (has no administration in time range)  sodium chloride 0.9 % bolus 1,000 mL (has no administration in time range)  ceFEPIme (MAXIPIME) 2 g in sodium chloride 0.9 % 100 mL IVPB (has no administration in time range)  metroNIDAZOLE (FLAGYL) IVPB 500 mg (has no administration in time range)  vancomycin (VANCOREADY) IVPB 1500 mg/300 mL (has no administration in time range)  potassium chloride (KLOR-CON) packet 40 mEq (has no administration in time range)  lactated ringers bolus 1,000 mL (0 mLs Intravenous Stopped 11/08/22 1436)  0.9 %  sodium chloride infusion (Manually program via Guardrails IV Fluids) ( Intravenous New Bag/Given 11/08/22 1438)  magnesium sulfate IVPB 2 g 50 mL (2 g Intravenous New Bag/Given 11/08/22 1444)    ED Course/ Medical Decision Making/ A&P                             Medical Decision Making Amount and/or Complexity of Data Reviewed Labs: ordered. Radiology: ordered. ECG/medicine tests: ordered.  Risk Prescription drug management.   This patient presents to the ED for concern of emesis and chest discomfort, this involves an extensive number of treatment options, and is a complaint that carries with it a high risk of complications and morbidity.  The differential diagnosis includes SBO, gastritis, GERD, cholecystitis, pancreatitis, ACS, anemia   Co morbidities that complicate the patient evaluation  decubitus ulcer, DM, anemia, PE, PVD, esophageal stricture, GERD, prior GI bleed, CHF, MS   Additional history obtained:  Additional history obtained from N/A External records from outside source obtained and reviewed  including EMR   Lab Tests:  I Ordered, and personally interpreted labs.  The pertinent results include: Severe anemia is present.  Hypokalemia and hypomagnesemia are present.  No leukocytosis is present.  Creatinine is doubled from baseline consistent with AKI, urinalysis is consistent with UTI.   Imaging Studies ordered:  I ordered imaging studies including chest x-ray,  CT of chest, abdomen, pelvis I independently visualized and interpreted imaging which showed bilateral staghorn calculi, large stool burden in rectum, skin thickening in sacral area I agree with the radiologist interpretation   Cardiac Monitoring: / EKG:  The patient was maintained on a cardiac monitor.  I personally viewed and interpreted the cardiac monitored which showed an underlying rhythm of: Sinus rhythm   Consultations Obtained:  I requested consultation with the critical care,  and discussed lab and imaging findings as well as pertinent plan - they recommend: ICU team will come and evaluate the patient   Problem List / ED Course / Critical interventions / Medication management  Patient presents for episode of emesis and some burning chest pain today.  Vital signs on arrival are notable for hypotension and tachycardia.  Despite this, patient is alert and oriented.  She denies any current pain.  IV fluids were ordered.  On examination of sacral area, patient does have ongoing sacral wound.  A very large amount of stool was present in rectum.  This was manually disimpacted.  There was no evidence of melena.  Initial lab work is notable for acute on chronic anemia.  I suspect that this is from ongoing blood loss from her chronic wound.  Patient consented for blood transfusion.  PRBCs were ordered.  Lab work was further notable for hypomagnesemia and hypokalemia.  Replacement electrolytes were ordered.  Although her creatinine is 1.1 today, this does represent a to ask increased from her baseline consistent with AKI.   Urinalysis is consistent with UTI.  Additionally, patient has findings in sacral area concerning for wound infection and/or cellulitis.  Broad-spectrum antibiotics were initiated.  Patient was admitted to hospitalist.  She did have persistent hypotension, which I suspect will improve as she gets blood products and fluids.  I did reach out to PCCM, who will come see the patient. I ordered medication including IV fluids for hydration; PRBCs for symptomatic anemia; broad-spectrum antibiotics for UTI and/or cellulitis/wound infection; magnesium sulfate for hypomagnesemia; potassium chloride for hypokalemia Reevaluation of the patient after these medicines showed that the patient improved I have reviewed the patients home medicines and have made adjustments as needed   Social Determinants of Health:  Lives at home with home health care support  CRITICAL CARE Performed by: Gloris Manchester   Total critical care time: 35 minutes  Critical care time was exclusive of separately billable procedures and treating other patients.  Critical care was necessary to treat or prevent imminent or life-threatening deterioration.  Critical care was time spent personally by me on the following activities: development of treatment plan with patient and/or surrogate as well as nursing, discussions with consultants, evaluation of patient's response to treatment, examination of patient, obtaining history from patient or surrogate, ordering and performing treatments and interventions, ordering and review of laboratory studies, ordering and review of radiographic studies, pulse oximetry and re-evaluation of patient's condition.          Final Clinical Impression(s) / ED Diagnoses Final diagnoses:  Wound of sacral region, initial encounter  Urinary tract infection with hematuria, site unspecified  Fecal impaction (HCC)  Symptomatic anemia    Rx / DC Orders ED Discharge Orders     None         Gloris Manchester,  MD 11/08/22 1556

## 2022-11-09 DIAGNOSIS — D649 Anemia, unspecified: Secondary | ICD-10-CM

## 2022-11-09 DIAGNOSIS — E861 Hypovolemia: Secondary | ICD-10-CM

## 2022-11-09 LAB — BPAM RBC
Blood Product Expiration Date: 202406242359
ISSUE DATE / TIME: 202405222052
ISSUE DATE / TIME: 202405230408
Unit Type and Rh: 5100

## 2022-11-09 LAB — BASIC METABOLIC PANEL
BUN: 40 mg/dL — ABNORMAL HIGH (ref 8–23)
CO2: <7 mmol/L — ABNORMAL LOW (ref 22–32)
Calcium: 7.9 mg/dL — ABNORMAL LOW (ref 8.9–10.3)
Chloride: 113 mmol/L — ABNORMAL HIGH (ref 98–111)
Creatinine, Ser: 0.89 mg/dL (ref 0.44–1.00)
GFR, Estimated: >60 mL/min (ref >60)
Glucose, Bld: 82 mg/dL (ref 70–99)
Potassium: 3.3 mmol/L — ABNORMAL LOW (ref 3.5–5.1)
Sodium: 133 mmol/L — ABNORMAL LOW (ref 135–145)

## 2022-11-09 LAB — TYPE AND SCREEN: Antibody Screen: NEGATIVE

## 2022-11-09 LAB — CBC
HCT: 47.2 % — ABNORMAL HIGH (ref 36.0–46.0)
Hemoglobin: 14.5 g/dL (ref 12.0–15.0)
MCH: 25.7 pg — ABNORMAL LOW (ref 26.0–34.0)
MCHC: 30.7 g/dL (ref 30.0–36.0)
MCV: 83.5 fL (ref 80.0–100.0)
Platelets: 435 10*3/uL — ABNORMAL HIGH (ref 150–400)
RBC: 5.65 MIL/uL — ABNORMAL HIGH (ref 3.87–5.11)
RDW: 24.1 % — ABNORMAL HIGH (ref 11.5–15.5)
WBC: 9.8 10*3/uL (ref 4.0–10.5)
nRBC: 1.8 % — ABNORMAL HIGH (ref 0.0–0.2)

## 2022-11-09 LAB — MAGNESIUM: Magnesium: 1.5 mg/dL — ABNORMAL LOW (ref 1.7–2.4)

## 2022-11-09 LAB — MRSA NEXT GEN BY PCR, NASAL: MRSA by PCR Next Gen: DETECTED — AB

## 2022-11-09 LAB — URINE CULTURE: Culture: >=100000 — AB

## 2022-11-09 MED ORDER — SENNOSIDES-DOCUSATE SODIUM 8.6-50 MG PO TABS
1.0000 | ORAL_TABLET | Freq: Two times a day (BID) | ORAL | Status: DC
  Administered 2022-11-09 – 2022-11-12 (×6): 1 via ORAL
  Filled 2022-11-09 (×6): qty 1

## 2022-11-09 MED ORDER — METHOCARBAMOL 500 MG PO TABS
500.0000 mg | ORAL_TABLET | Freq: Two times a day (BID) | ORAL | Status: DC
  Administered 2022-11-09 – 2022-11-14 (×11): 500 mg via ORAL
  Filled 2022-11-09 (×11): qty 1

## 2022-11-09 MED ORDER — LEVOCETIRIZINE DIHYDROCHLORIDE 5 MG PO TABS: 5.0000 mg | ORAL_TABLET | Freq: Every evening | ORAL | Status: DC

## 2022-11-09 MED ORDER — LORATADINE 10 MG PO TABS
10.0000 mg | ORAL_TABLET | Freq: Every day | ORAL | Status: DC
  Administered 2022-11-09 – 2022-11-14 (×6): 10 mg via ORAL
  Filled 2022-11-09 (×6): qty 1

## 2022-11-09 MED ORDER — FLUTICASONE PROPIONATE 50 MCG/ACT NA SUSP
1.0000 | Freq: Every day | NASAL | Status: DC
  Administered 2022-11-09 – 2022-11-14 (×6): 1 via NASAL
  Filled 2022-11-09: qty 16

## 2022-11-09 MED ORDER — NAPHAZOLINE-GLYCERIN-ZINC SULF 0.012-0.25-0.25 % OP SOLN: Freq: Every morning | OPHTHALMIC | Status: DC

## 2022-11-09 MED ORDER — POTASSIUM CHLORIDE CRYS ER 20 MEQ PO TBCR
40.0000 meq | EXTENDED_RELEASE_TABLET | Freq: Once | ORAL | Status: AC
  Administered 2022-11-09: 40 meq via ORAL
  Filled 2022-11-09: qty 2

## 2022-11-09 MED ORDER — ORAL CARE MOUTH RINSE
15.0000 mL | OROMUCOSAL | Status: DC
  Administered 2022-11-09 – 2022-11-14 (×19): 15 mL via OROMUCOSAL

## 2022-11-09 MED ORDER — PNEUMOCOCCAL 20-VAL CONJ VACC 0.5 ML IM SUSY
0.5000 mL | PREFILLED_SYRINGE | INTRAMUSCULAR | Status: AC
  Administered 2022-11-10: 0.5 mL via INTRAMUSCULAR
  Filled 2022-11-09: qty 0.5

## 2022-11-09 MED ORDER — ORAL CARE MOUTH RINSE: 15.0000 mL | OROMUCOSAL | Status: DC | PRN

## 2022-11-09 MED ORDER — MAGNESIUM SULFATE 2 GM/50ML IV SOLN
2.0000 g | Freq: Once | INTRAVENOUS | Status: AC
  Administered 2022-11-09: 2 g via INTRAVENOUS
  Filled 2022-11-09: qty 50

## 2022-11-09 MED ORDER — NAPHAZOLINE-GLYCERIN 0.012-0.25 % OP SOLN
1.0000 [drp] | Freq: Every day | OPHTHALMIC | Status: DC
  Administered 2022-11-09: 2 [drp] via OPHTHALMIC
  Administered 2022-11-10 – 2022-11-11 (×2): 1 [drp] via OPHTHALMIC
  Administered 2022-11-12: 2 [drp] via OPHTHALMIC
  Administered 2022-11-13 – 2022-11-14 (×2): 1 [drp] via OPHTHALMIC
  Filled 2022-11-09: qty 15

## 2022-11-09 MED ORDER — TRAZODONE HCL 100 MG PO TABS
100.0000 mg | ORAL_TABLET | Freq: Every day | ORAL | Status: DC
  Administered 2022-11-09 – 2022-11-13 (×5): 100 mg via ORAL
  Filled 2022-11-09 (×5): qty 1

## 2022-11-09 MED ORDER — MEDIHONEY WOUND/BURN DRESSING EX PSTE
1.0000 | PASTE | Freq: Every day | CUTANEOUS | Status: DC
  Administered 2022-11-09 – 2022-11-14 (×5): 1 via TOPICAL
  Filled 2022-11-09: qty 44

## 2022-11-09 MED ORDER — SODIUM CHLORIDE 0.9 % IV SOLN: INTRAVENOUS | Status: DC

## 2022-11-09 MED ORDER — PANTOPRAZOLE SODIUM 40 MG PO TBEC
40.0000 mg | DELAYED_RELEASE_TABLET | Freq: Every day | ORAL | Status: DC
  Administered 2022-11-09 – 2022-11-14 (×6): 40 mg via ORAL
  Filled 2022-11-09 (×6): qty 1

## 2022-11-09 NOTE — Plan of Care (Signed)
  Problem: Education: Goal: Knowledge of General Education information will improve Description: Including pain rating scale, medication(s)/side effects and non-pharmacologic comfort measures Outcome: Progressing   Problem: Clinical Measurements: Goal: Respiratory complications will improve Outcome: Progressing   Problem: Activity: Goal: Risk for activity intolerance will decrease Outcome: Progressing   

## 2022-11-09 NOTE — H&P (View-Only) (Signed)
Referring Provider: Dr. Ezenduka Primary Care Physician:  Issifu, Adjara, FNP Primary Gastroenterologist:  Unassigned  Reason for Consultation:  Anemia; Abnormal CT scan  HPI: Jocelyn Sanchez is a 67 y.o. female with quadriplegia, multiple sclerosis and bedbound admitted with Hgb 3.6 without any overt bleeding. Hypotensive on prsentation and responded to fluids. FOBT negative. Noncontrast CT showed question of proximal gastric wall thickening. Unable to obtain any history from patient. Recent fecal impaction and no black or red stool after disimpaction per chart review. On Eliquis for PE. Nurse present in room.   Past Medical History:  Diagnosis Date   Anemia    DM II (diabetes mellitus, type II), controlled (HCC)    Gait disorder    HTN (hypertension)    Lymphedema    MS (multiple sclerosis) (HCC)    Pulmonary embolism (HCC)     Past Surgical History:  Procedure Laterality Date   ENTEROSCOPY N/A 08/10/2020   Procedure: ENTEROSCOPY;  Surgeon: Karki, Arya, MD;  Location: [facility];  Service: Gastroenterology;  Laterality: N/A;   ESOPHAGOGASTRODUODENOSCOPY N/A 07/12/2016   Procedure: ESOPHAGOGASTRODUODENOSCOPY (EGD);  Surgeon: John N Perry, MD;  Location: [facility];  Service: Endoscopy;  Laterality: N/A;   ESOPHAGOGASTRODUODENOSCOPY N/A 02/11/2019   Procedure: ESOPHAGOGASTRODUODENOSCOPY (EGD);  Surgeon: Hung, Patrick, MD;  Location: [facility];  Service: Endoscopy;  Laterality: N/A;   FLEXIBLE SIGMOIDOSCOPY Left 03/09/2015   Procedure: FLEXIBLE SIGMOIDOSCOPY;  Surgeon: Patrick Hung, MD;  Location: [facility];  Service: Endoscopy;  Laterality: Left;   HOT HEMOSTASIS N/A 02/11/2019   Procedure: HOT HEMOSTASIS (ARGON PLASMA COAGULATION/BICAP);  Surgeon: Hung, Patrick, MD;  Location: [facility];  Service: Endoscopy;  Laterality: N/A;   HOT HEMOSTASIS N/A 08/10/2020   Procedure: HOT HEMOSTASIS (ARGON PLASMA COAGULATION/BICAP);  Surgeon: Karki, Arya, MD;  Location: [facility];   Service: Gastroenterology;  Laterality: N/A;    Prior to Admission medications   Medication Sig Start Date End Date Taking? Authorizing Provider  acetaminophen (TYLENOL) 500 MG tablet Take 500-1,000 mg by mouth See admin instructions. Take 1,000 mg by mouth at bedtime and an additional 500-1,000 mg once a day as needed for pain or headaches   Yes [provider]  Ascorbic Acid (VITAMIN C) 1000 MG tablet Take 1,000 mg by mouth daily. 08/02/20  Yes [provider]  carvedilol (COREG) 6.25 MG tablet Take 6.25 mg by mouth in the morning. 12/21/18  Yes [provider]  [employer] D3 50 MCG (2000 UT) CAPS Take 2,000 Units by mouth daily. 08/02/20  Yes [provider]  ELIQUIS 5 MG TABS tablet Take 5 mg by mouth 2 (two) times daily. 03/11/19  Yes [provider]  ENTRESTO 24-26 MG Take 1 tablet by mouth 2 (two) times daily. 03/13/19  Yes [provider]  fluticasone (FLONASE) 50 MCG/ACT nasal spray Place 1 spray into both nostrils in the morning.   Yes [provider]  ketoconazole (NIZORAL) 2 % cream Apply 1 Application topically 2 (two) times daily as needed for irritation (affected finger).   Yes [provider]  levocetirizine (XYZAL) 5 MG tablet Take 5 mg by mouth every evening.   Yes [provider]  liver oil-zinc oxide (DESITIN) 40 % ointment Apply 1 application topically as needed for irritation. Patient taking differently: Apply 1 application  topically as needed for irritation (affected areas). 08/12/20  Yes Ezenduka, Nkeiruka J, MD  methocarbamol (ROBAXIN) 500 MG tablet Take 500 mg by mouth in the morning and at bedtime. 06/27/20  Yes [provider]  Naphazoline-Glycerin-Zinc Sulf (CLEAR EYES MAXIMUM ITCHY EYE OP) Place 1 drop into both eyes in the morning.   Yes [provider]  omeprazole (PRILOSEC) 20 MG capsule Take 1 capsule (20 mg total) by mouth 2 (two) times daily before a meal. Patient taking  differently: Take 20 mg by mouth daily before breakfast. 08/12/20 11/08/22 Yes Ezenduka, Nkeiruka J, MD  traZODone (DESYREL) 100 MG tablet Take 100 mg by mouth at bedtime. 06/04/20  Yes [provider]  Zinc Sulfate 220 (50 Zn) MG TABS Take 220 mg by mouth daily. 08/02/20  Yes [provider]    Scheduled Meds:  fluticasone  1 spray Each Nare Daily   leptospermum manuka honey  1 Application Topical Daily   loratadine  10 mg Oral Daily   methocarbamol  500 mg Oral BID   naphazoline-glycerin  1-2 drop Both Eyes Daily   mouth rinse  15 mL Mouth Rinse 4 times per day   pantoprazole  40 mg Oral Daily   [START ON 11/10/2022] pneumococcal 20-valent conjugate vaccine  0.5 mL Intramuscular Tomorrow-1000   polyethylene glycol  17 g Oral BID   potassium chloride  40 mEq Oral Once   senna-docusate  1 tablet Oral BID   traZODone  100 mg Oral QHS   Continuous Infusions:  sodium chloride 125 mL/hr at 11/09/22 1345   magnesium sulfate bolus IVPB     piperacillin-tazobactam (ZOSYN)  IV 12.5 mL/hr at 11/09/22 1345   vancomycin     PRN Meds:.mouth rinse  Allergies as of 11/08/2022 - Review Complete 11/08/2022  Allergen Reaction Noted   Sulfa antibiotics Anaphylaxis, Shortness Of Breath, and Swelling 05/10/2011   Chlorhexidine Other (See Comments) 08/07/2020    Family History  Problem Relation Age of Onset   Multiple sclerosis Mother     Social History   Socioeconomic History   Marital status: Single    Spouse name: Not on file   Number of children: 3   Years of education: Not on file   Highest education level: Not on file  Occupational History    Employer: UNEMPLOYED  Tobacco Use   Smoking status: Former    Types: Cigarettes   Smokeless tobacco: Never  Substance and Sexual Activity   Alcohol use: No   Drug use: No   Sexual activity: Never  Other Topics Concern   Not on file  Social History Narrative   Patient is single.   Patient has three children.   Patient  is disabled.   Patient is right-handed.   Patient does not drink any caffeine.   Social Determinants of Health   Financial Resource Strain: Not on file  Food Insecurity: Not on file  Transportation Needs: Not on file  Physical Activity: Not on file  Stress: Not on file  Social Connections: Not on file  Intimate Partner Violence: Not on file    Review of Systems: All negative except as stated above in HPI.  Physical Exam: Vital signs: Vitals:   11/09/22 0723 11/09/22 1330  BP: 112/61 (!) 109/58  Pulse: 94 93  Resp: 20 18  Temp: (!) 97.5 F (36.4 C) 97.8 F (36.6 C)  SpO2: 100%    Last BM Date : 11/08/22 General:   Chronically ill-appearing, elderly, well-nourished, lethargic, no acute distress  Head: normocephalic, atraumatic Eyes: anicteric sclera ENT: oropharynx clear Neck: supple, nontender Lungs:  Clear throughout to auscultation.   No wheezes, crackles, or rhonchi. No acute distress. Heart:  Regular rate and rhythm;   no murmurs, clicks, rubs,  or gallops. Abdomen: soft, nontender, nondistended, +BS  Rectal:  Deferred Ext: no edema  GI:  Lab Results: Recent Labs    11/08/22 1216 11/08/22 1239 11/08/22 2121 11/09/22 1126  WBC 10.0  --  11.1* 9.8  HGB 3.6* 5.4* 7.3* 14.5  HCT 15.7* 16.0* 27.9* 47.2*  PLT 784*  --  619* 435*   BMET Recent Labs    11/08/22 1216 11/08/22 1239 11/08/22 2121 11/09/22 1126  NA 128* 131* 130* 133*  K 3.4* 4.4 3.5 3.3*  CL 107 110 110 113*  CO2 8*  --  8* <7*  GLUCOSE 81 78 83 82  BUN 51* 61* 48* 40*  CREATININE 1.10* 1.10* 0.91 0.89  CALCIUM 8.3*  --  8.0* 7.9*   LFT Recent Labs    11/08/22 1216  PROT 7.5  ALBUMIN 2.9*  AST 5*  ALT 7  ALKPHOS 82  BILITOT 0.9   PT/INR Recent Labs    11/08/22 1216  LABPROT 26.9*  INR 2.5*     Studies/Results: CT CHEST ABDOMEN PELVIS WO CONTRAST  Result Date: 11/08/2022 CLINICAL DATA:  Heartburn. Vomiting x1. Constipated. Hypotensive. Unintended weight loss. Multiple  sclerosis. Diabetes. EXAM: CT CHEST, ABDOMEN AND PELVIS WITHOUT CONTRAST TECHNIQUE: Multidetector CT imaging of the chest, abdomen and pelvis was performed following the standard protocol without IV contrast. RADIATION DOSE REDUCTION: This exam was performed according to the departmental dose-optimization program which includes automated exposure control, adjustment of the mA and/or kV according to patient size and/or use of iterative reconstruction technique. COMPARISON:  07/26/2019 abdominopelvic CT. Most recent chest CT 10/26/2015. FINDINGS: CT CHEST FINDINGS Cardiovascular: Multifactorial degradation, including lack of IV contrast, patient arm position, not raised above the head. Wire and lead artifacts. Aortic atherosclerosis. No aortic aneurysm. Tortuous thoracic aorta. Normal heart size, without pericardial effusion. Left main and 3 vessel coronary artery calcification. Mediastinum/Nodes: No mediastinal or hilar adenopathy, given limitations of unenhanced CT. Lungs/Pleura: No pleural fluid. Subpleural 3 mm right lower lobe pulmonary nodule on 57/6 can be presumed benign and do/does not warrant imaging follow-up per Fleischner criteria. Minimal dependent atelectasis in the left lower lobe. Musculoskeletal: Anasarca.  No acute osseous abnormality. CT ABDOMEN PELVIS FINDINGS Hepatobiliary: Artifact degradation continuing into the upper abdomen. Grossly normal noncontrast appearance of the liver, gallbladder. Pancreas: Mild pancreatic atrophy. No duct dilatation or acute inflammation. Spleen: Normal in size, without focal abnormality. Adrenals/Urinary Tract: Normal adrenal glands. 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 The largest right-sided stone is in the pelvis a 2.7 cm on 63/2. Left-sided pelvic stone or conglomerate of stones measures 2.5 cm on 59/2. The stone burden is relatively similar to 07/26/2019 CT. Possible upper pole caliectasis bilaterally, suboptimally evaluated. No ureteric  stones are identified. Both ureters are mildly prominent throughout. The bladder appears mildly thick walled but is underdistended. Stomach/Bowel: Apparent proximal stomach wall thickening including on 47/2. Large stool ball in the rectum. Normal terminal ileum and appendix.  Normal small bowel. Vascular/Lymphatic: Advanced aortic and branch vessel atherosclerosis. No abdominopelvic adenopathy. Reproductive: Uterine calcifications are likely due to underlying fibroids. No adnexal mass. Other: No significant free fluid. No free intraperitoneal air. Anasarca slightly asymmetric and greater on the left. Musculoskeletal: Soft tissue thickening within the subcutaneous fat superficial to the right sacroiliac joint on 89/2. Heterogeneous marrow density is likely due to osteopenia and possible renal osteodystrophy. Lumbosacral spondylosis. Trace L4-5 anterolisthesis. Convex left lumbar spine curvature is mild. IMPRESSION: 1. Multifactorial degradation, as detailed above. 2.    No acute process in the chest. 3. 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 Probable upper pole caliectasis bilaterally, suboptimally evaluated. 4. Apparent proximal gastric wall thickening could be due to underdistention. Given the history of weight loss, correlate with symptoms of gastritis or gastric neoplasm. Consider endoscopy. 5. Stool burden in the rectum suggests fecal impaction 6. Skin thickening superficial the right sacroiliac joint could represent cellulitis or scarring. No well-defined ulcer. Consider physical exam correlation. 7. Coronary artery atherosclerosis. Aortic Atherosclerosis (ICD10-I70.0). Electronically Signed   By: Kyle  Talbot M.D.   On: 11/08/2022 14:46   DG Chest Port 1 View  Result Date: 11/08/2022 CLINICAL DATA:  Questionable sepsis.  Evaluate for abnormality. EXAM: PORTABLE CHEST 1 VIEW COMPARISON:  Radiographs 08/10/2020 and 08/06/2020. FINDINGS: 1359 hours.  The heart size and mediastinal contours are stable with mild aortic atherosclerosis. The lungs are clear. There is no pleural effusion or pneumothorax. No acute osseous findings are evident. There is a mild thoracic scoliosis. Telemetry leads overlie the chest. IMPRESSION: No evidence of active cardiopulmonary process. Aortic atherosclerosis. Electronically Signed   By: William  Veazey M.D.   On: 11/08/2022 14:41    Impression/Plan: Severe symptomatic anemia without overt bleeding. Noncontrast CT showed proximal stomach wall thickening that is likely due to underdistention but prior to resuming anticoagulation will do EGD to further evaluate.    LOS: 1 day   Shamika Pedregon C Breion Novacek  11/09/2022, 4:28 PM  Questions please call [phone]  

## 2022-11-09 NOTE — Progress Notes (Signed)
PROGRESS NOTE  Jocelyn Sanchez:[id number] DOB: 08-22-1955 DOA: 11/08/2022 PCP: Bethanie Dicker, FNP  HPI/Recap of past 24 hours: Jocelyn Sanchez is a 67 y.o. female with medical history significant of multiple sclerosis with quadriparetic/bedbound status, history of chronic sacral ulcer, diabetes mellitus type 2, hypertension, history of PE on Eliquis.  Patient presents to the ED with complaints of a single episode of vomiting, burning epigastric pain with constipation.  Patient reports nonbloody vomitus, denies any abdominal pain.  In the ED, patient noted to be hypotensive with SBP in the 60s to 70s range with tachycardia.  PCCM consulted.  Labs showed hemoglobin of 5.4, platelets 784, INR 2.5, FOBT negative.  Patient was given IV fluids of which patient responded well to.  Noted to have large amount of stool in the rectum which was manually disimpacted without any evidence of melena.  Due to improvement of BP with IV fluids, [facility] hospitalist was asked to admit patient.  Patient was admitted for further management.     Today, patient noted to be awake, alert, oriented.  Reports feeling somewhat better overall, denies any new complaints.  Continues to deny any evidence of bleeding, left-sided chest pain, shortness of breath, abdominal pain, any further nausea/vomiting, fever/chills.   Assessment/Plan: Principal Problem:   Anemia Active Problems:   AKI (acute kidney injury) (HCC)   Sacral wound   Hypotension   Gastric wall thickening   H/O renal calculi   Constipation   Hyponatremia    Hypotension BP improving with IV fluids Likely multifactorial, significant volume depletion, AKI, severe anemia, medication induced S/p 4 units of PRBC with good response Continue IV fluids Continue to hold BP meds for now  Acute on chronic blood loss anemia Hemoglobin around 5 on admission Unsure etiology (gastritis/gastric neoplasm) Vs chronic bleeding from wound FOBT negative Anemia panel  was not done prior to transfusion CT abd showed gastric wall thickening, concern for gastritis or neoplasm (??possible cause) GI consulted S/p 4 units of PRBC Daily CBC, transfuse if hemoglobin less than 7  Chronic sacral wound Quadriplegic History of MS Appears to be ??somewhat infected Currently afebrile, with no leukocytosis BC x 2 pending CT abdomen pelvis without any evidence of abscess Continue vancomycin and Zosyn Wound care requested  UTI History of renal calculi UA showed large leukocytes, many bacteria, WBC greater than 50 UC growing >100,000 gram neg rods Continue Vanco and Zosyn for now  AKI (acute kidney injury) (HCC) Improved s/p IV fluids Daily BMP   Hyponatremia Likely 2/2 hypovolemia Continue IV fluids Daily BMP  Hypokalemia/hypomagnesemia Replace as needed  Type 2 diabetes mellitus SSI, Accu-Cheks, hypoglycemic protocol  Chronic systolic and diastolic HF Last echo done in 9562 showed EF of 40 to 45%, diffuse hypokinesis, grade 1 diastolic dysfunction Hold home Entresto/Coreg for now  Hypertension BP currently soft Hold home Entresto, Coreg  History of PE Hold home Eliquis for now due to severe anemia   Constipation Was disimpacted in the ER on 5/22 Strict bowel regimen    Pressure Injury 11/08/22 Coccyx Medial Unstageable - Full thickness tissue loss in which the base of the injury is covered by slough (yellow, tan, gray, green or brown) and/or eschar (tan, brown or black) in the wound bed. Tunneling (Active)  11/08/22 2000  Location: Coccyx  Location Orientation: Medial  Staging: Unstageable - Full thickness tissue loss in which the base of the injury is covered by slough (yellow, tan, gray, green or brown) and/or eschar (tan, brown or black)  in the wound bed.  Wound Description (Comments): Tunneling  Present on Admission: Yes  Dressing Type Foam - Lift dressing to assess site every shift;Gauze (Comment);Moist to dry 11/09/22 0730      Pressure Injury 11/09/22 Heel Left Deep Tissue Pressure Injury - Purple or maroon localized area of discolored intact skin or blood-filled blister due to damage of underlying soft tissue from pressure and/or shear. 2 cm x 3 cm (Active)  11/09/22 0700  Location: Heel  Location Orientation: Left  Staging: Deep Tissue Pressure Injury - Purple or maroon localized area of discolored intact skin or blood-filled blister due to damage of underlying soft tissue from pressure and/or shear.  Wound Description (Comments): 2 cm x 3 cm  Present on Admission:   Dressing Type Foam - Lift dressing to assess site every shift 11/09/22 0730     Pressure Injury 11/09/22 Buttocks Right Stage 3 -  Full thickness tissue loss. Subcutaneous fat may be visible but bone, tendon or muscle are NOT exposed. 2 cm x 3 cm x 1 cm w/1.4 cm undermining from 12-3 o'clock (Active)  11/09/22 0700  Location: Buttocks  Location Orientation: Right  Staging: Stage 3 -  Full thickness tissue loss. Subcutaneous fat may be visible but bone, tendon or muscle are NOT exposed.  Wound Description (Comments): 2 cm x 3 cm x 1 cm w/1.4 cm undermining from 12-3 o'clock  Present on Admission: Yes  Dressing Type Moist to dry 11/09/22 0730         Estimated body mass index is 26.22 kg/m as calculated from the following:   Height as of this encounter: 5\' 3"  (1.6 m).   Weight as of this encounter: 67.1 kg.     Code Status: DNR  Family Communication: None at bedside  Disposition Plan: Status is: Inpatient Remains inpatient appropriate because: Level of care      Consultants: PCCM  Procedures: None  Antimicrobials: Zosyn Vancomycin  DVT prophylaxis: SCDs for now   Objective: Vitals:   11/09/22 0428 11/09/22 0440 11/09/22 0723 11/09/22 1330  BP: (!) 96/56  112/61 (!) 109/58  Pulse: 81  94 93  Resp: 19  20 18   Temp: (!) 97 F (36.1 C) (!) 97.2 F (36.2 C) (!) 97.5 F (36.4 C) 97.8 F (36.6 C)  TempSrc: Axillary  Axillary Axillary Oral  SpO2: 100%  100%   Weight:      Height:        Intake/Output Summary (Last 24 hours) at 11/09/2022 1544 Last data filed at 11/09/2022 1345 Gross per 24 hour  Intake 4782.09 ml  Output 600 ml  Net 4182.09 ml   Filed Weights   11/08/22 1200 11/08/22 1700  Weight: 67.1 kg 67.1 kg    Exam: General: NAD, awake, oriented, chronically ill-appearing Cardiovascular: S1, S2 present Respiratory: CTAB Abdomen: Soft, nontender, nondistended, bowel sounds present Musculoskeletal: No bilateral pedal edema noted Skin: Noted sacral ulcer, foul-smelling with some erythema Psychiatry: Normal mood  Neurology: Quadriparetic    Data Reviewed: CBC: Recent Labs  Lab 11/08/22 1216 11/08/22 1239 11/08/22 2121 11/09/22 1126  WBC 10.0  --  11.1* 9.8  NEUTROABS 7.7  --   --   --   HGB 3.6* 5.4* 7.3* 14.5  HCT 15.7* 16.0* 27.9* 47.2*  MCV 66.5*  --  77.1* 83.5  PLT 784*  --  619* 435*   Basic Metabolic Panel: Recent Labs  Lab 11/08/22 1216 11/08/22 1239 11/08/22 2121 11/09/22 1126  NA 128* 131* 130* 133*  K 3.4* 4.4 3.5 3.3*  CL 107 110 110 113*  CO2 8*  --  8* <7*  GLUCOSE 81 78 83 82  BUN 51* 61* 48* 40*  CREATININE 1.10* 1.10* 0.91 0.89  CALCIUM 8.3*  --  8.0* 7.9*  MG 1.5*  --   --  1.5*   GFR: Estimated Creatinine Clearance: 55.7 mL/min (by C-G formula based on SCr of 0.89 mg/dL). Liver Function Tests: Recent Labs  Lab 11/08/22 1216  AST 5*  ALT 7  ALKPHOS 82  BILITOT 0.9  PROT 7.5  ALBUMIN 2.9*   Recent Labs  Lab 11/08/22 1216  LIPASE 88*   No results for input(s): "AMMONIA" in the last 168 hours. Coagulation Profile: Recent Labs  Lab 11/08/22 1216  INR 2.5*   Cardiac Enzymes: No results for input(s): "CKTOTAL", "CKMB", "CKMBINDEX", "TROPONINI" in the last 168 hours. BNP (last 3 results) No results for input(s): "PROBNP" in the last 8760 hours. HbA1C: No results for input(s): "HGBA1C" in the last 72 hours. CBG: No results for  input(s): "GLUCAP" in the last 168 hours. Lipid Profile: No results for input(s): "CHOL", "HDL", "LDLCALC", "TRIG", "CHOLHDL", "LDLDIRECT" in the last 72 hours. Thyroid Function Tests: No results for input(s): "TSH", "T4TOTAL", "FREET4", "T3FREE", "THYROIDAB" in the last 72 hours. Anemia Panel: No results for input(s): "VITAMINB12", "FOLATE", "FERRITIN", "TIBC", "IRON", "RETICCTPCT" in the last 72 hours. Urine analysis:    Component Value Date/Time   COLORURINE YELLOW 11/08/2022 1357   APPEARANCEUR TURBID (A) 11/08/2022 1357   LABSPEC 1.012 11/08/2022 1357   PHURINE 6.0 11/08/2022 1357   GLUCOSEU NEGATIVE 11/08/2022 1357   HGBUR MODERATE (A) 11/08/2022 1357   BILIRUBINUR NEGATIVE 11/08/2022 1357   KETONESUR 5 (A) 11/08/2022 1357   PROTEINUR 100 (A) 11/08/2022 1357   UROBILINOGEN 0.2 03/01/2015 1913   NITRITE NEGATIVE 11/08/2022 1357   LEUKOCYTESUR LARGE (A) 11/08/2022 1357   Sepsis Labs: @LABRCNTIP (procalcitonin:4,lacticidven:4)  ) Recent Results (from the past 240 hour(s))  Urine Culture     Status: Abnormal (Preliminary result)   Collection Time: 11/08/22  1:57 PM   Specimen: Urine, Clean Catch  Result Value Ref Range Status   Specimen Description   Final    URINE, CLEAN CATCH Performed at [facility], [address] [place], [place] [address]    Special Requests   Final    NONE Reflexed from [id number] Performed at [facility], [address] [place], [place] [address]    Culture (A)  Final    >=100,000 COLONIES/mL GRAM NEGATIVE RODS CULTURE REINCUBATED FOR BETTER GROWTH Performed at [facility], [address], [place], [place] [address]    Report Status PENDING  Incomplete  MRSA Next Gen by PCR, Nasal     Status: Abnormal   Collection Time: 11/09/22  8:43 AM   Specimen: Nasal Mucosa; Nasal Swab  Result Value Ref Range Status   MRSA by PCR Next Gen DETECTED (A) NOT DETECTED Final    Comment: (NOTE) The GeneXpert MRSA Assay (FDA  approved for NASAL specimens only), is one component of a comprehensive MRSA colonization surveillance program. It is not intended to diagnose MRSA infection nor to guide or monitor treatment for MRSA infections. Test performance is not FDA approved in patients less than 39 years old. Performed at [facility], [address], [place], [place] [address]       Studies: No results found.  Scheduled Meds:  fluticasone  1 spray Each Nare Daily  leptospermum manuka honey  1 Application Topical Daily   loratadine  10 mg Oral Daily   methocarbamol  500 mg Oral BID   naphazoline-glycerin  1-2 drop Both Eyes Daily   mouth rinse  15 mL Mouth Rinse 4 times per day   pantoprazole  40 mg Oral Daily   [START ON 11/10/2022] pneumococcal 20-valent conjugate vaccine  0.5 mL Intramuscular Tomorrow-1000   polyethylene glycol  17 g Oral BID   senna-docusate  1 tablet Oral BID   traZODone  100 mg Oral QHS    Continuous Infusions:  sodium chloride 125 mL/hr at 11/09/22 1345   piperacillin-tazobactam (ZOSYN)  IV 12.5 mL/hr at 11/09/22 1345   vancomycin       LOS: 1 day     Briant Cedar, MD [facility]  If 7PM-7AM, please contact night-coverage [email or web address] 11/09/2022, 3:44 PM

## 2022-11-09 NOTE — TOC Initial Note (Addendum)
Transition of Care [facility]) - Initial/Assessment Note    Patient Details  Name: Jocelyn Sanchez MRN: [id number] Date of Birth: 14-Aug-1955  Transition of Care [facility]) CM/SW Contact:    Howell Rucks, RN Phone Number: 11/09/2022, 9:05 AM  Clinical Narrative:   [facility] referral received for HH/DME needs. Request to MD to enter PT/OT eval, await recommendation.  - 3:22pm PT eval order entered, team chat to MD to cosign and place face to face,         Patient Goals and CMS Choice            Expected Discharge Plan and Services                                              Prior Living Arrangements/Services                       Activities of Daily Living Home Assistive Devices/Equipment: Other (Comment) ADL Screening (condition at time of admission) Patient's cognitive ability adequate to safely complete daily activities?: Yes Is the patient deaf or have difficulty hearing?: No Does the patient have difficulty seeing, even when wearing glasses/contacts?: No Does the patient have difficulty concentrating, remembering, or making decisions?: No Patient able to express need for assistance with ADLs?: Yes Does the patient have difficulty dressing or bathing?: Yes Independently performs ADLs?: No Communication: Independent Dressing (OT): Dependent Is this a change from baseline?: Pre-admission baseline Grooming: Dependent Is this a change from baseline?: Pre-admission baseline Feeding: Needs assistance Is this a change from baseline?: Pre-admission baseline Bathing: Dependent Is this a change from baseline?: Pre-admission baseline Toileting: Dependent Is this a change from baseline?: Pre-admission baseline In/Out Bed: Dependent Is this a change from baseline?: Pre-admission baseline Walks in Home: Dependent Is this a change from baseline?: Pre-admission baseline Does the patient have difficulty walking or climbing stairs?: Yes Weakness of Legs: Both Weakness  of Arms/Hands: Both  Permission Sought/Granted                  Emotional Assessment              Admission diagnosis:  Fecal impaction (HCC) [K56.41] Anemia [D64.9] Wound of sacral region, initial encounter [S31.000A] Symptomatic anemia [D64.9] Urinary tract infection with hematuria, site unspecified [N39.0, R31.9] Patient Active Problem List   Diagnosis Date Noted   Anemia 11/08/2022   Sacral wound 11/08/2022   Hypotension 11/08/2022   Gastric wall thickening 11/08/2022   H/O renal calculi 11/08/2022   Constipation 11/08/2022   Hyponatremia 11/08/2022   Pressure injury of skin 08/09/2020   Chronic combined systolic (congestive) and diastolic (congestive) heart failure (HCC) 08/09/2020   COVID-19 virus infection 08/06/2020   Acute blood loss anemia 08/06/2020   Sepsis (HCC) 07/26/2019   Metabolic acidosis    Diabetic acidosis without coma (HCC)    GI bleed 02/10/2019   Proteus mirabilis infection 08/14/2017   Diarrhea 08/14/2017   Bacteremia 08/14/2017   Gastric and duodenal angiodysplasia    Esophageal stricture    Gastroesophageal reflux disease with esophagitis    Hx of pulmonary embolus 10/26/2015   PVD (peripheral vascular disease) (HCC) 07/04/2015   Essential hypertension, benign 06/17/2015   Decubitus ulcer of ankle, stage 4 (HCC) 05/10/2015   Fever 03/08/2015   Septic shock (HCC) 03/06/2015   Diabetes mellitus with peripheral vascular disease (HCC)  03/06/2015   Leukocytosis 03/06/2015   Anemia of chronic disease 03/06/2015   Pressure injury of sacral region, stage 3 (HCC) 03/06/2015   Lymphedema 03/06/2015   Sepsis with acute renal failure (HCC) 03/01/2015   AKI (acute kidney injury) (HCC) 03/01/2015   Urinary tract infection with hematuria 11/28/2014   Multiple sclerosis (HCC) 05/11/2011   PCP:  Bethanie Dicker, FNP Pharmacy:   [id number] - [place], [place] - [address] AT [facility] [address] [place] [address] Phone: [phone] Fax: [phone]     Social Determinants of Health (SDOH) Social History: SDOH Screenings   Tobacco Use: Medium Risk (11/08/2022)   SDOH Interventions:     Readmission Risk Interventions     No data to display

## 2022-11-09 NOTE — Consult Note (Signed)
Referring Provider: Dr. Sharolyn Douglas Primary Care Physician:  Bethanie Dicker, FNP Primary Gastroenterologist:  Gentry Fitz  Reason for Consultation:  Anemia; Abnormal CT scan  HPI: Jocelyn Sanchez is a 67 y.o. female with quadriplegia, multiple sclerosis and bedbound admitted with Hgb 3.6 without any overt bleeding. Hypotensive on prsentation and responded to fluids. FOBT negative. Noncontrast CT showed question of proximal gastric wall thickening. Unable to obtain any history from patient. Recent fecal impaction and no black or red stool after disimpaction per chart review. On Eliquis for PE. Nurse present in room.   Past Medical History:  Diagnosis Date   Anemia    DM II (diabetes mellitus, type II), controlled (HCC)    Gait disorder    HTN (hypertension)    Lymphedema    MS (multiple sclerosis) (HCC)    Pulmonary embolism (HCC)     Past Surgical History:  Procedure Laterality Date   ENTEROSCOPY N/A 08/10/2020   Procedure: ENTEROSCOPY;  Surgeon: Kerin Salen, MD;  Location: [facility];  Service: Gastroenterology;  Laterality: N/A;   ESOPHAGOGASTRODUODENOSCOPY N/A 07/12/2016   Procedure: ESOPHAGOGASTRODUODENOSCOPY (EGD);  Surgeon: Hilarie Fredrickson, MD;  Location: [facility];  Service: Endoscopy;  Laterality: N/A;   ESOPHAGOGASTRODUODENOSCOPY N/A 02/11/2019   Procedure: ESOPHAGOGASTRODUODENOSCOPY (EGD);  Surgeon: Jeani Hawking, MD;  Location: [facility];  Service: Endoscopy;  Laterality: N/A;   FLEXIBLE SIGMOIDOSCOPY Left 03/09/2015   Procedure: FLEXIBLE SIGMOIDOSCOPY;  Surgeon: Jeani Hawking, MD;  Location: [facility];  Service: Endoscopy;  Laterality: Left;   HOT HEMOSTASIS N/A 02/11/2019   Procedure: HOT HEMOSTASIS (ARGON PLASMA COAGULATION/BICAP);  Surgeon: Jeani Hawking, MD;  Location: [facility];  Service: Endoscopy;  Laterality: N/A;   HOT HEMOSTASIS N/A 08/10/2020   Procedure: HOT HEMOSTASIS (ARGON PLASMA COAGULATION/BICAP);  Surgeon: Kerin Salen, MD;  Location: [facility];   Service: Gastroenterology;  Laterality: N/A;    Prior to Admission medications   Medication Sig Start Date End Date Taking? Authorizing Provider  acetaminophen (TYLENOL) 500 MG tablet Take 500-1,000 mg by mouth See admin instructions. Take 1,000 mg by mouth at bedtime and an additional 500-1,000 mg once a day as needed for pain or headaches   Yes [provider]  Ascorbic Acid (VITAMIN C) 1000 MG tablet Take 1,000 mg by mouth daily. 08/02/20  Yes [provider]  carvedilol (COREG) 6.25 MG tablet Take 6.25 mg by mouth in the morning. 12/21/18  Yes [provider]  [facility] D3 50 MCG (2000 UT) CAPS Take 2,000 Units by mouth daily. 08/02/20  Yes [provider]  ELIQUIS 5 MG TABS tablet Take 5 mg by mouth 2 (two) times daily. 03/11/19  Yes [provider]  ENTRESTO 24-26 MG Take 1 tablet by mouth 2 (two) times daily. 03/13/19  Yes [provider]  fluticasone (FLONASE) 50 MCG/ACT nasal spray Place 1 spray into both nostrils in the morning.   Yes [provider]  ketoconazole (NIZORAL) 2 % cream Apply 1 Application topically 2 (two) times daily as needed for irritation (affected finger).   Yes [provider]  levocetirizine (XYZAL) 5 MG tablet Take 5 mg by mouth every evening.   Yes [provider]  liver oil-zinc oxide (DESITIN) 40 % ointment Apply 1 application topically as needed for irritation. Patient taking differently: Apply 1 application  topically as needed for irritation (affected areas). 08/12/20  Yes Briant Cedar, MD  methocarbamol (ROBAXIN) 500 MG tablet Take 500 mg by mouth in the morning and at bedtime. 06/27/20  Yes [provider]  Naphazoline-Glycerin-Zinc Sulf (CLEAR EYES MAXIMUM ITCHY EYE OP) Place 1 drop into both eyes in the morning.   Yes [provider]  omeprazole (PRILOSEC) 20 MG capsule Take 1 capsule (20 mg total) by mouth 2 (two) times daily before a meal. Patient taking  differently: Take 20 mg by mouth daily before breakfast. 08/12/20 11/08/22 Yes Briant Cedar, MD  traZODone (DESYREL) 100 MG tablet Take 100 mg by mouth at bedtime. 06/04/20  Yes [provider]  Zinc Sulfate 220 (50 Zn) MG TABS Take 220 mg by mouth daily. 08/02/20  Yes [provider]    Scheduled Meds:  fluticasone  1 spray Each Nare Daily   leptospermum manuka honey  1 Application Topical Daily   loratadine  10 mg Oral Daily   methocarbamol  500 mg Oral BID   naphazoline-glycerin  1-2 drop Both Eyes Daily   mouth rinse  15 mL Mouth Rinse 4 times per day   pantoprazole  40 mg Oral Daily   [START ON 11/10/2022] pneumococcal 20-valent conjugate vaccine  0.5 mL Intramuscular Tomorrow-1000   polyethylene glycol  17 g Oral BID   potassium chloride  40 mEq Oral Once   senna-docusate  1 tablet Oral BID   traZODone  100 mg Oral QHS   Continuous Infusions:  sodium chloride 125 mL/hr at 11/09/22 1345   magnesium sulfate bolus IVPB     piperacillin-tazobactam (ZOSYN)  IV 12.5 mL/hr at 11/09/22 1345   vancomycin     PRN Meds:.mouth rinse  Allergies as of 11/08/2022 - Review Complete 11/08/2022  Allergen Reaction Noted   Sulfa antibiotics Anaphylaxis, Shortness Of Breath, and Swelling 05/10/2011   Chlorhexidine Other (See Comments) 08/07/2020    Family History  Problem Relation Age of Onset   Multiple sclerosis Mother     Social History   Socioeconomic History   Marital status: Single    Spouse name: Not on file   Number of children: 3   Years of education: Not on file   Highest education level: Not on file  Occupational History    Employer: UNEMPLOYED  Tobacco Use   Smoking status: Former    Types: Cigarettes   Smokeless tobacco: Never  Substance and Sexual Activity   Alcohol use: No   Drug use: No   Sexual activity: Never  Other Topics Concern   Not on file  Social History Narrative   Patient is single.   Patient has three children.   Patient  is disabled.   Patient is right-handed.   Patient does not drink any caffeine.   Social Determinants of Health   Financial Resource Strain: Not on file  Food Insecurity: Not on file  Transportation Needs: Not on file  Physical Activity: Not on file  Stress: Not on file  Social Connections: Not on file  Intimate Partner Violence: Not on file    Review of Systems: All negative except as stated above in HPI.  Physical Exam: Vital signs: Vitals:   11/09/22 0723 11/09/22 1330  BP: 112/61 (!) 109/58  Pulse: 94 93  Resp: 20 18  Temp: (!) 97.5 F (36.4 C) 97.8 F (36.6 C)  SpO2: 100%    Last BM Date : 11/08/22 General:   Chronically ill-appearing, elderly, well-nourished, lethargic, no acute distress  Head: normocephalic, atraumatic Eyes: anicteric sclera ENT: oropharynx clear Neck: supple, nontender Lungs:  Clear throughout to auscultation.   No wheezes, crackles, or rhonchi. No acute distress. Heart:  Regular rate and rhythm;  no murmurs, clicks, rubs,  or gallops. Abdomen: soft, nontender, nondistended, +BS  Rectal:  Deferred Ext: no edema  GI:  Lab Results: Recent Labs    11/08/22 1216 11/08/22 1239 11/08/22 2121 11/09/22 1126  WBC 10.0  --  11.1* 9.8  HGB 3.6* 5.4* 7.3* 14.5  HCT 15.7* 16.0* 27.9* 47.2*  PLT 784*  --  619* 435*   BMET Recent Labs    11/08/22 1216 11/08/22 1239 11/08/22 2121 11/09/22 1126  NA 128* 131* 130* 133*  K 3.4* 4.4 3.5 3.3*  CL 107 110 110 113*  CO2 8*  --  8* <7*  GLUCOSE 81 78 83 82  BUN 51* 61* 48* 40*  CREATININE 1.10* 1.10* 0.91 0.89  CALCIUM 8.3*  --  8.0* 7.9*   LFT Recent Labs    11/08/22 1216  PROT 7.5  ALBUMIN 2.9*  AST 5*  ALT 7  ALKPHOS 82  BILITOT 0.9   PT/INR Recent Labs    11/08/22 1216  LABPROT 26.9*  INR 2.5*     Studies/Results: CT CHEST ABDOMEN PELVIS WO CONTRAST  Result Date: 11/08/2022 CLINICAL DATA:  Heartburn. Vomiting x1. Constipated. Hypotensive. Unintended weight loss. Multiple  sclerosis. Diabetes. EXAM: CT CHEST, ABDOMEN AND PELVIS WITHOUT CONTRAST TECHNIQUE: Multidetector CT imaging of the chest, abdomen and pelvis was performed following the standard protocol without IV contrast. RADIATION DOSE REDUCTION: This exam was performed according to the departmental dose-optimization program which includes automated exposure control, adjustment of the mA and/or kV according to patient size and/or use of iterative reconstruction technique. COMPARISON:  07/26/2019 abdominopelvic CT. Most recent chest CT 10/26/2015. FINDINGS: CT CHEST FINDINGS Cardiovascular: Multifactorial degradation, including lack of IV contrast, patient arm position, not raised above the head. Wire and lead artifacts. Aortic atherosclerosis. No aortic aneurysm. Tortuous thoracic aorta. Normal heart size, without pericardial effusion. Left main and 3 vessel coronary artery calcification. Mediastinum/Nodes: No mediastinal or hilar adenopathy, given limitations of unenhanced CT. Lungs/Pleura: No pleural fluid. Subpleural 3 mm right lower lobe pulmonary nodule on 57/6 can be presumed benign and do/does not warrant imaging follow-up per Fleischner criteria. Minimal dependent atelectasis in the left lower lobe. Musculoskeletal: Anasarca.  No acute osseous abnormality. CT ABDOMEN PELVIS FINDINGS Hepatobiliary: Artifact degradation continuing into the upper abdomen. Grossly normal noncontrast appearance of the liver, gallbladder. Pancreas: Mild pancreatic atrophy. No duct dilatation or acute inflammation. Spleen: Normal in size, without focal abnormality. Adrenals/Urinary Tract: Normal adrenal glands. 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 The largest right-sided stone is in the pelvis a 2.7 cm on 63/2. Left-sided pelvic stone or conglomerate of stones measures 2.5 cm on 59/2. The stone burden is relatively similar to 07/26/2019 CT. Possible upper pole caliectasis bilaterally, suboptimally evaluated. No ureteric  stones are identified. Both ureters are mildly prominent throughout. The bladder appears mildly thick walled but is underdistended. Stomach/Bowel: Apparent proximal stomach wall thickening including on 47/2. Large stool ball in the rectum. Normal terminal ileum and appendix.  Normal small bowel. Vascular/Lymphatic: Advanced aortic and branch vessel atherosclerosis. No abdominopelvic adenopathy. Reproductive: Uterine calcifications are likely due to underlying fibroids. No adnexal mass. Other: No significant free fluid. No free intraperitoneal air. Anasarca slightly asymmetric and greater on the left. Musculoskeletal: Soft tissue thickening within the subcutaneous fat superficial to the right sacroiliac joint on 89/2. Heterogeneous marrow density is likely due to osteopenia and possible renal osteodystrophy. Lumbosacral spondylosis. Trace L4-5 anterolisthesis. Convex left lumbar spine curvature is mild. IMPRESSION: 1. Multifactorial degradation, as detailed above. 2.  No acute process in the chest. 3. 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 Probable upper pole caliectasis bilaterally, suboptimally evaluated. 4. Apparent proximal gastric wall thickening could be due to underdistention. Given the history of weight loss, correlate with symptoms of gastritis or gastric neoplasm. Consider endoscopy. 5. Stool burden in the rectum suggests fecal impaction 6. Skin thickening superficial the right sacroiliac joint could represent cellulitis or scarring. No well-defined ulcer. Consider physical exam correlation. 7. Coronary artery atherosclerosis. Aortic Atherosclerosis (ICD10-I70.0). Electronically Signed   By: Jeronimo Greaves M.D.   On: 11/08/2022 14:46   DG Chest Port 1 View  Result Date: 11/08/2022 CLINICAL DATA:  Questionable sepsis.  Evaluate for abnormality. EXAM: PORTABLE CHEST 1 VIEW COMPARISON:  Radiographs 08/10/2020 and 08/06/2020. FINDINGS: 1359 hours.  The heart size and mediastinal contours are stable with mild aortic atherosclerosis. The lungs are clear. There is no pleural effusion or pneumothorax. No acute osseous findings are evident. There is a mild thoracic scoliosis. Telemetry leads overlie the chest. IMPRESSION: No evidence of active cardiopulmonary process. Aortic atherosclerosis. Electronically Signed   By: Carey Bullocks M.D.   On: 11/08/2022 14:41    Impression/Plan: Severe symptomatic anemia without overt bleeding. Noncontrast CT showed proximal stomach wall thickening that is likely due to underdistention but prior to resuming anticoagulation will do EGD to further evaluate.    LOS: 1 day   Shirley Friar  11/09/2022, 4:28 PM  Questions please call [phone]

## 2022-11-09 NOTE — Consult Note (Signed)
WOC Nurse Consult Note: Reason for Consult: presacral wound  Wound type: 1.  Stage 3 Pressure Injury upper R buttock and sacrum  2.  Unstageable Pressure Injury R buttock (below Stage 3)  3.  Deep Tissue Pressure Injury L medial heel  Pressure Injury POA: yes Measurement: 1.  Stage 3 PI upper Right buttock 2 cm x 3 cm x 1 cm with 1.4 cm undermining from 12 o'clock to 3 o'clock 100% pink and moist  2.  Unstageable Pressure Injury R buttock (below Stage 3) 1 cm x 1 cm 100% yellow necrotic tissue  3.  Sacral Stage 3 Pressure Injury 1 cm x 2 cm x 1 cm 100% pink and moist  4.  Deep Tissue Pressure Injury L medial heel 2 cm x 3 cm purple maroon discoloration, skin intact   Drainage (amount, consistency, odor) minimal serosanguinous buttocks and sacrum, dry left heel  Periwound: patient has a total area 12 cm x 7 cm to sacrum/buttocks that appears to be old healing Pressure Injury, pink scarred tissue  Dressing procedure/placement/frequency:  Cleanse R upper buttock and sacral wounds with NS, using Q tip applicator apply saline moistened (not saturated) Kerlix to wound bed twice daily making sure to cover undermining in R upper buttock wound.  Cover with dry gauze and silicone foam or ABD pad whichever is preferred.  Clean R buttock Unstageable PI (the one with yellow/white tissue) with NS, apply Medihoney to wound bed daily, cover with dry gauze and silicone foam or ABD pad whichever is preferred.  Change silicone foam dressing q3 days and prn soiling.  DTPI L medial heel cover with silicone foam dressing. Lift silicone foam daily to assess area. Change silicone foam q3 days and prn soiling.    Patient should remain on a low air loss mattress throughout hospitalization for pressure redistribution and moisture management.   Prevalon boots ordered for bilateral feet to offload pressure.   POC discussed with patient and bedside nurse. WOC team will not follow at this time. Re-consult if further needs  arise.   Thank you,     Priscella Mann MSN, RN-BC, [employer] [phone]

## 2022-11-10 ENCOUNTER — Inpatient Hospital Stay (HOSPITAL_COMMUNITY): Payer: Medicare PPO | Admitting: Anesthesiology

## 2022-11-10 ENCOUNTER — Encounter (HOSPITAL_COMMUNITY): Payer: Self-pay | Admitting: Internal Medicine

## 2022-11-10 ENCOUNTER — Encounter (HOSPITAL_COMMUNITY)
Admission: EM | Disposition: A | Discharge status: Home / Self Care | Payer: Self-pay | Source: Home / Self Care | Attending: Internal Medicine

## 2022-11-10 DIAGNOSIS — K766 Portal hypertension: Secondary | ICD-10-CM | POA: Diagnosis not present

## 2022-11-10 DIAGNOSIS — K449 Diaphragmatic hernia without obstruction or gangrene: Secondary | ICD-10-CM

## 2022-11-10 DIAGNOSIS — D649 Anemia, unspecified: Secondary | ICD-10-CM | POA: Diagnosis not present

## 2022-11-10 DIAGNOSIS — K2100 Gastro-esophageal reflux disease with esophagitis, without bleeding: Secondary | ICD-10-CM

## 2022-11-10 DIAGNOSIS — K2970 Gastritis, unspecified, without bleeding: Secondary | ICD-10-CM | POA: Diagnosis not present

## 2022-11-10 DIAGNOSIS — E861 Hypovolemia: Secondary | ICD-10-CM | POA: Diagnosis not present

## 2022-11-10 DIAGNOSIS — D500 Iron deficiency anemia secondary to blood loss (chronic): Secondary | ICD-10-CM | POA: Diagnosis not present

## 2022-11-10 DIAGNOSIS — E119 Type 2 diabetes mellitus without complications: Secondary | ICD-10-CM

## 2022-11-10 HISTORY — PX: ESOPHAGOGASTRODUODENOSCOPY (EGD) WITH PROPOFOL: SHX5813

## 2022-11-10 LAB — CBC WITH DIFFERENTIAL/PLATELET
Abs Immature Granulocytes: 0.07 10*3/uL (ref 0.00–0.07)
Basophils Absolute: 0.1 10*3/uL (ref 0.0–0.1)
Basophils Relative: 1 %
Eosinophils Absolute: 0.4 10*3/uL (ref 0.0–0.5)
Eosinophils Relative: 4 %
HCT: 43.2 % (ref 36.0–46.0)
Hemoglobin: 13.5 g/dL (ref 12.0–15.0)
Immature Granulocytes: 1 %
Lymphocytes Relative: 11 %
Lymphs Abs: 1 10*3/uL (ref 0.7–4.0)
MCH: 26 pg (ref 26.0–34.0)
MCHC: 31.3 g/dL (ref 30.0–36.0)
MCV: 83.2 fL (ref 80.0–100.0)
Monocytes Absolute: 0.9 10*3/uL (ref 0.1–1.0)
Monocytes Relative: 9 %
Neutro Abs: 7.1 10*3/uL (ref 1.7–7.7)
Neutrophils Relative %: 74 %
Platelets: 471 10*3/uL — ABNORMAL HIGH (ref 150–400)
RBC: 5.19 MIL/uL — ABNORMAL HIGH (ref 3.87–5.11)
RDW: 25.1 % — ABNORMAL HIGH (ref 11.5–15.5)
WBC: 9.5 10*3/uL (ref 4.0–10.5)
nRBC: 1.1 % — ABNORMAL HIGH (ref 0.0–0.2)

## 2022-11-10 LAB — BASIC METABOLIC PANEL
Anion gap: 10 (ref 5–15)
BUN: 34 mg/dL — ABNORMAL HIGH (ref 8–23)
CO2: 8 mmol/L — ABNORMAL LOW (ref 22–32)
Calcium: 7.6 mg/dL — ABNORMAL LOW (ref 8.9–10.3)
Chloride: 116 mmol/L — ABNORMAL HIGH (ref 98–111)
Creatinine, Ser: 0.81 mg/dL (ref 0.44–1.00)
GFR, Estimated: >60 mL/min (ref >60)
Glucose, Bld: 77 mg/dL (ref 70–99)
Potassium: 3.1 mmol/L — ABNORMAL LOW (ref 3.5–5.1)
Sodium: 134 mmol/L — ABNORMAL LOW (ref 135–145)

## 2022-11-10 LAB — MAGNESIUM: Magnesium: 1.9 mg/dL (ref 1.7–2.4)

## 2022-11-10 LAB — CULTURE, BLOOD (ROUTINE X 2): Culture: NO GROWTH

## 2022-11-10 LAB — BPAM RBC
Blood Product Expiration Date: 202406242359
ISSUE DATE / TIME: 202405230039

## 2022-11-10 LAB — HIV ANTIBODY (ROUTINE TESTING W REFLEX): HIV Screen 4th Generation wRfx: NONREACTIVE

## 2022-11-10 LAB — URINE CULTURE

## 2022-11-10 LAB — TYPE AND SCREEN
ABO/RH(D): O POS
Unit division: 0
Unit division: 0

## 2022-11-10 SURGERY — ESOPHAGOGASTRODUODENOSCOPY (EGD) WITH PROPOFOL
Anesthesia: Monitor Anesthesia Care

## 2022-11-10 MED ORDER — SODIUM CHLORIDE 0.9 % IV SOLN: INTRAVENOUS | Status: DC

## 2022-11-10 MED ORDER — JUVEN PO PACK
1.0000 | PACK | Freq: Two times a day (BID) | ORAL | Status: DC
  Administered 2022-11-10 – 2022-11-14 (×7): 1 via ORAL
  Filled 2022-11-10 (×7): qty 1

## 2022-11-10 MED ORDER — ADULT MULTIVITAMIN W/MINERALS CH
1.0000 | ORAL_TABLET | Freq: Every day | ORAL | Status: DC
  Administered 2022-11-10 – 2022-11-14 (×5): 1 via ORAL
  Filled 2022-11-10 (×5): qty 1

## 2022-11-10 MED ORDER — PROPOFOL 500 MG/50ML IV EMUL
INTRAVENOUS | Status: DC | PRN
  Administered 2022-11-10: 150 ug/kg/min via INTRAVENOUS

## 2022-11-10 MED ORDER — POTASSIUM CHLORIDE 10 MEQ/100ML IV SOLN
10.0000 meq | INTRAVENOUS | Status: AC
  Administered 2022-11-10 (×4): 10 meq via INTRAVENOUS
  Filled 2022-11-10 (×4): qty 100

## 2022-11-10 MED ORDER — LACTATED RINGERS IV SOLN: INTRAVENOUS | Status: DC

## 2022-11-10 MED ORDER — PHENYLEPHRINE HCL (PRESSORS) 10 MG/ML IV SOLN
INTRAVENOUS | Status: DC | PRN
  Administered 2022-11-10: 160 ug via INTRAVENOUS

## 2022-11-10 MED ORDER — MUPIROCIN 2 % EX OINT
1.0000 | TOPICAL_OINTMENT | Freq: Two times a day (BID) | CUTANEOUS | Status: DC
  Administered 2022-11-10 – 2022-11-14 (×9): 1 via NASAL
  Filled 2022-11-10: qty 22

## 2022-11-10 SURGICAL SUPPLY — 15 items

## 2022-11-10 NOTE — Transfer of Care (Signed)
Immediate Anesthesia Transfer of Care Note  Patient: Jocelyn Sanchez  Procedure(s) Performed: ESOPHAGOGASTRODUODENOSCOPY (EGD) WITH PROPOFOL  Patient Location: PACU  Anesthesia Type:MAC  Level of Consciousness: sedated, patient cooperative, and responds to stimulation  Airway & Oxygen Therapy: Patient Spontanous Breathing and Patient connected to face mask oxygen  Post-op Assessment: Report given to RN and Post -op Vital signs reviewed and stable  Post vital signs: Reviewed and stable  Last Vitals:  Vitals Value Taken Time  BP    Temp    Pulse    Resp    SpO2      Last Pain:  Vitals:   11/10/22 1020  TempSrc: Temporal  PainSc: 0-No pain      Patients Stated Pain Goal: 2 (11/10/22 0900)  Complications: No notable events documented.

## 2022-11-10 NOTE — TOC Progression Note (Signed)
Transition of Care [facility]) - Progression Note    Patient Details  Name: Jocelyn Sanchez MRN: [id number] Date of Birth: [birth year]  [facility]) CM/SW Contact  Howell Rucks, RN Phone Number: 11/10/2022, 11:10 AM  Clinical Narrative:  Pt eval completed-  family reports pt has caregivers, mostly remains in her hospital bed, "sometimes" is transferred to her w/c, and is active with home health for wound care. Son reports all DME needs present: hospital bed, manual w/c and hoyer lift. Both deny PT needs at home.          Expected Discharge Plan and Services                                               Social Determinants of Health (SDOH) Interventions SDOH Screenings   Housing: Patient Unable To Answer (11/09/2022)  Tobacco Use: Medium Risk (11/10/2022)    Readmission Risk Interventions     No data to display

## 2022-11-10 NOTE — Anesthesia Preprocedure Evaluation (Signed)
Anesthesia Evaluation  Patient identified by MRN, date of birth, ID band Patient awake    Reviewed: Allergy & Precautions, H&P , NPO status , Patient's Chart, lab work & pertinent test results  Airway Mallampati: II  TM Distance: >3 FB Neck ROM: Full    Dental no notable dental hx.    Pulmonary neg pulmonary ROS, former smoker   Pulmonary exam normal breath sounds clear to auscultation       Cardiovascular hypertension, +CHF  Normal cardiovascular exam Rhythm:Regular Rate:Normal  - Left ventricle: The cavity size was normal. Wall thickness was    increased in a pattern of mild LVH. Systolic function was mildly    to moderately reduced. The estimated ejection fraction was in the    range of 40% to 45%. Diffuse hypokinesis. There is severe    hypokinesis of the inferolateral myocardium. Doppler parameters    are consistent with abnormal left ventricular relaxation (grade 1    diastolic dysfunction).  - Mitral valve: There was mild regurgitation.  - Pulmonary arteries: Systolic pressure was mildly increased. PA    peak pressure: 39 mm Hg (S).     Neuro/Psych MS  Neuromuscular disease  negative psych ROS   GI/Hepatic negative GI ROS, Neg liver ROS,,,  Endo/Other  negative endocrine ROSdiabetes, Type 2    Renal/GU negative Renal ROS  negative genitourinary   Musculoskeletal negative musculoskeletal ROS (+)    Abdominal   Peds negative pediatric ROS (+)  Hematology negative hematology ROS (+)   Anesthesia Other Findings   Reproductive/Obstetrics negative OB ROS                             Anesthesia Physical Anesthesia Plan  ASA: 3  Anesthesia Plan: MAC   Post-op Pain Management: Minimal or no pain anticipated   Induction: Intravenous  PONV Risk Score and Plan: 1 and Propofol infusion and Treatment may vary due to age or medical condition  Airway Management Planned: Simple Face  Mask  Additional Equipment:   Intra-op Plan:   Post-operative Plan:   Informed Consent: I have reviewed the patients History and Physical, chart, labs and discussed the procedure including the risks, benefits and alternatives for the proposed anesthesia with the patient or authorized representative who has indicated his/her understanding and acceptance.    Suspend DNR.   Dental advisory given  Plan Discussed with: CRNA and Surgeon  Anesthesia Plan Comments:        Anesthesia Quick Evaluation

## 2022-11-10 NOTE — Anesthesia Postprocedure Evaluation (Signed)
Anesthesia Post Note  Patient: Jocelyn Sanchez  Procedure(s) Performed: ESOPHAGOGASTRODUODENOSCOPY (EGD) WITH PROPOFOL     Patient location during evaluation: PACU Anesthesia Type: MAC Level of consciousness: awake and alert Pain management: pain level controlled Vital Signs Assessment: post-procedure vital signs reviewed and stable Respiratory status: spontaneous breathing, nonlabored ventilation, respiratory function stable and patient connected to nasal cannula oxygen Cardiovascular status: stable and blood pressure returned to baseline Postop Assessment: no apparent nausea or vomiting Anesthetic complications: no  No notable events documented.  Last Vitals:  Vitals:   11/10/22 1129 11/10/22 1130  BP: (!) 95/53 (!) 95/54  Pulse: 89 87  Resp: 13 14  Temp: (!) 36.1 C   SpO2: 100% 100%    Last Pain:  Vitals:   11/10/22 1130  TempSrc:   PainSc: 0-No pain                 Elyna Pangilinan S

## 2022-11-10 NOTE — Progress Notes (Signed)
PROGRESS NOTE  Jocelyn Sanchez ZOX:[id number] DOB: 09-Jul-1955 DOA: 11/08/2022 PCP: Bethanie Dicker, FNP  HPI/Recap of past 24 hours: Jocelyn Sanchez is a 67 y.o. female with medical history significant of multiple sclerosis with quadriparetic/bedbound status, history of chronic sacral ulcer, diabetes mellitus type 2, hypertension, history of PE on Eliquis.  Patient presents to the ED with complaints of a single episode of vomiting, burning epigastric pain with constipation.  Patient reports nonbloody vomitus, denies any abdominal pain.  In the ED, patient noted to be hypotensive with SBP in the 60s to 70s range with tachycardia.  PCCM consulted.  Labs showed hemoglobin of 5.4, platelets 784, INR 2.5, FOBT negative.  Patient was given IV fluids of which patient responded well to.  Noted to have large amount of stool in the rectum which was manually disimpacted without any evidence of melena.  Due to improvement of BP with IV fluids, [facility] hospitalist was asked to admit patient.  Patient was admitted for further management.    Today, patient denies any new complaints.  Saw patient after EGD.    Assessment/Plan: Principal Problem:   Anemia Active Problems:   AKI (acute kidney injury) (HCC)   Sacral wound   Hypotension   Gastric wall thickening   H/O renal calculi   Constipation   Hyponatremia    Hypotension BP improving with IV fluids Likely multifactorial, significant volume depletion, AKI, severe anemia, medication induced S/p 4 units of PRBC with good response Continue IV fluids Continue to hold BP meds for now  Acute on chronic blood loss anemia Hemoglobin around 5 on admission Unsure etiology (gastritis/gastric neoplasm) Vs chronic bleeding from wound FOBT negative S/p 4 units of PRBC Anemia panel was not done prior to transfusion CT abd showed gastric wall thickening, concern for gastritis or neoplasm (??possible cause) GI consulted, s/p EGD which showed no gastric mass,  proximal stomach inflammation likely from mucosal irritation from the hiatal hernia.  No further GI workup needed. Daily CBC, transfuse if hemoglobin less than 7 Continue PPI  Chronic sacral wound Quadriplegic History of MS Appears to be ??somewhat infected Currently afebrile, with no leukocytosis BC x 2 pending CT abdomen pelvis without any evidence of abscess Continue vancomycin and Zosyn Wound care consulted  ESBL UTI History of renal calculi UA showed large leukocytes, many bacteria, WBC greater than 50 UC growing >100,000 E. Coli ESBL Continue Vanco and Zosyn   AKI (acute kidney injury) (HCC) Improved s/p IV fluids Daily BMP   Hyponatremia Likely 2/2 hypovolemia Continue IV fluids Daily BMP  Hypokalemia/hypomagnesemia Replace as needed  Type 2 diabetes mellitus SSI, Accu-Cheks, hypoglycemic protocol  Chronic systolic and diastolic HF Last echo done in 8119 showed EF of 40 to 45%, diffuse hypokinesis, grade 1 diastolic dysfunction Hold home Entresto/Coreg for now  Hypertension BP currently soft Hold home Entresto, Coreg  History of PE Hold home Eliquis for now due to severe anemia   Constipation Was disimpacted in the ER on 5/22 Strict bowel regimen    Pressure Injury 11/08/22 Coccyx Medial Unstageable - Full thickness tissue loss in which the base of the injury is covered by slough (yellow, tan, gray, green or brown) and/or eschar (tan, brown or black) in the wound bed. Tunneling (Active)  11/08/22 2000  Location: Coccyx  Location Orientation: Medial  Staging: Unstageable - Full thickness tissue loss in which the base of the injury is covered by slough (yellow, tan, gray, green or brown) and/or eschar (tan, brown or black) in  the wound bed.  Wound Description (Comments): Tunneling  Present on Admission: Yes  Dressing Type Foam - Lift dressing to assess site every shift;Gauze (Comment) 11/10/22 1149     Pressure Injury 11/09/22 Heel Left Deep Tissue  Pressure Injury - Purple or maroon localized area of discolored intact skin or blood-filled blister due to damage of underlying soft tissue from pressure and/or shear. 2 cm x 3 cm (Active)  11/09/22 0700  Location: Heel  Location Orientation: Left  Staging: Deep Tissue Pressure Injury - Purple or maroon localized area of discolored intact skin or blood-filled blister due to damage of underlying soft tissue from pressure and/or shear.  Wound Description (Comments): 2 cm x 3 cm  Present on Admission:   Dressing Type Foam - Lift dressing to assess site every shift 11/10/22 0900     Pressure Injury 11/09/22 Buttocks Right Stage 3 -  Full thickness tissue loss. Subcutaneous fat may be visible but bone, tendon or muscle are NOT exposed. 2 cm x 3 cm x 1 cm w/1.4 cm undermining from 12-3 o'clock (Active)  11/09/22 0700  Location: Buttocks  Location Orientation: Right  Staging: Stage 3 -  Full thickness tissue loss. Subcutaneous fat may be visible but bone, tendon or muscle are NOT exposed.  Wound Description (Comments): 2 cm x 3 cm x 1 cm w/1.4 cm undermining from 12-3 o'clock  Present on Admission: Yes  Dressing Type None 11/10/22 1149         Estimated body mass index is 26.22 kg/m as calculated from the following:   Height as of this encounter: 5\' 3"  (1.6 m).   Weight as of this encounter: 67.1 kg.     Code Status: DNR  Family Communication: None at bedside  Disposition Plan: Status is: Inpatient Remains inpatient appropriate because: Level of care      Consultants: PCCM GI  Procedures: EGD on 5/24  Antimicrobials: Zosyn Vancomycin  DVT prophylaxis: SCDs for now   Objective: Vitals:   11/10/22 1130 11/10/22 1139 11/10/22 1149 11/10/22 1231  BP: (!) 95/54 104/63 111/63 127/65  Pulse: 87 92 88 90  Resp: 14 12 13 16   Temp:    (!) 97.3 F (36.3 C)  TempSrc:    Oral  SpO2: 100% 100% 100% 100%  Weight:      Height:        Intake/Output Summary (Last 24 hours)  at 11/10/2022 1756 Last data filed at 11/10/2022 1618 Gross per 24 hour  Intake 2846.79 ml  Output 750 ml  Net 2096.79 ml   Filed Weights   11/08/22 1200 11/08/22 1700  Weight: 67.1 kg 67.1 kg    Exam: General: NAD, awake, oriented, chronically ill-appearing Cardiovascular: S1, S2 present Respiratory: CTAB Abdomen: Soft, nontender, nondistended, bowel sounds present Musculoskeletal: No bilateral pedal edema noted Skin: Noted sacral ulcer, foul-smelling with some erythema Psychiatry: Normal mood  Neurology: Quadriparetic    Data Reviewed: CBC: Recent Labs  Lab 11/08/22 1216 11/08/22 1239 11/08/22 2121 11/09/22 1126 11/10/22 0515  WBC 10.0  --  11.1* 9.8 9.5  NEUTROABS 7.7  --   --   --  7.1  HGB 3.6* 5.4* 7.3* 14.5 13.5  HCT 15.7* 16.0* 27.9* 47.2* 43.2  MCV 66.5*  --  77.1* 83.5 83.2  PLT 784*  --  619* 435* 471*   Basic Metabolic Panel: Recent Labs  Lab 11/08/22 1216 11/08/22 1239 11/08/22 2121 11/09/22 1126 11/10/22 0515  NA 128* 131* 130* 133* 134*  K 3.4* 4.4  3.5 3.3* 3.1*  CL 107 110 110 113* 116*  CO2 8*  --  8* <7* 8*  GLUCOSE 81 78 83 82 77  BUN 51* 61* 48* 40* 34*  CREATININE 1.10* 1.10* 0.91 0.89 0.81  CALCIUM 8.3*  --  8.0* 7.9* 7.6*  MG 1.5*  --   --  1.5* 1.9   GFR: Estimated Creatinine Clearance: 61.2 mL/min (by C-G formula based on SCr of 0.81 mg/dL). Liver Function Tests: Recent Labs  Lab 11/08/22 1216  AST 5*  ALT 7  ALKPHOS 82  BILITOT 0.9  PROT 7.5  ALBUMIN 2.9*   Recent Labs  Lab 11/08/22 1216  LIPASE 88*   No results for input(s): "AMMONIA" in the last 168 hours. Coagulation Profile: Recent Labs  Lab 11/08/22 1216  INR 2.5*   Cardiac Enzymes: No results for input(s): "CKTOTAL", "CKMB", "CKMBINDEX", "TROPONINI" in the last 168 hours. BNP (last 3 results) No results for input(s): "PROBNP" in the last 8760 hours. HbA1C: No results for input(s): "HGBA1C" in the last 72 hours. CBG: No results for input(s):  "GLUCAP" in the last 168 hours. Lipid Profile: No results for input(s): "CHOL", "HDL", "LDLCALC", "TRIG", "CHOLHDL", "LDLDIRECT" in the last 72 hours. Thyroid Function Tests: No results for input(s): "TSH", "T4TOTAL", "FREET4", "T3FREE", "THYROIDAB" in the last 72 hours. Anemia Panel: No results for input(s): "VITAMINB12", "FOLATE", "FERRITIN", "TIBC", "IRON", "RETICCTPCT" in the last 72 hours. Urine analysis:    Component Value Date/Time   COLORURINE YELLOW 11/08/2022 1357   APPEARANCEUR TURBID (A) 11/08/2022 1357   LABSPEC 1.012 11/08/2022 1357   PHURINE 6.0 11/08/2022 1357   GLUCOSEU NEGATIVE 11/08/2022 1357   HGBUR MODERATE (A) 11/08/2022 1357   BILIRUBINUR NEGATIVE 11/08/2022 1357   KETONESUR 5 (A) 11/08/2022 1357   PROTEINUR 100 (A) 11/08/2022 1357   UROBILINOGEN 0.2 03/01/2015 1913   NITRITE NEGATIVE 11/08/2022 1357   LEUKOCYTESUR LARGE (A) 11/08/2022 1357   Sepsis Labs: @LABRCNTIP (procalcitonin:4,lacticidven:4)  ) Recent Results (from the past 240 hour(s))  Urine Culture     Status: Abnormal   Collection Time: 11/08/22  1:57 PM   Specimen: Urine, Clean Catch  Result Value Ref Range Status   Specimen Description   Final    URINE, CLEAN CATCH Performed at [facility], [address], [place], [place] [address]    Special Requests   Final    NONE Reflexed from [id number] Performed at [facility], [address], [place], [place] [address]    Culture (A)  Final    >=100,000 COLONIES/mL ESCHERICHIA COLI Confirmed Extended Spectrum Beta-Lactamase Producer (ESBL).  In bloodstream infections from ESBL organisms, carbapenems are preferred over piperacillin/tazobactam. They are shown to have a lower risk of mortality.    Report Status 11/10/2022 FINAL  Final   Organism ID, Bacteria ESCHERICHIA COLI (A)  Final      Susceptibility   Escherichia coli - MIC*    AMPICILLIN >=32 RESISTANT Resistant     CEFAZOLIN >=64 RESISTANT Resistant     CEFEPIME 16  RESISTANT Resistant     CIPROFLOXACIN >=4 RESISTANT Resistant     GENTAMICIN <=1 SENSITIVE Sensitive     IMIPENEM <=0.25 SENSITIVE Sensitive     NITROFURANTOIN <=16 SENSITIVE Sensitive     TRIMETH/SULFA <=20 SENSITIVE Sensitive     AMPICILLIN/SULBACTAM >=32 RESISTANT Resistant     PIP/TAZO 16 SENSITIVE Sensitive     * >=100,000 COLONIES/mL ESCHERICHIA COLI  MRSA Next Gen by PCR, Nasal     Status: Abnormal  Collection Time: 11/09/22  8:43 AM   Specimen: Nasal Mucosa; Nasal Swab  Result Value Ref Range Status   MRSA by PCR Next Gen DETECTED (A) NOT DETECTED Final    Comment: (NOTE) The GeneXpert MRSA Assay (FDA approved for NASAL specimens only), is one component of a comprehensive MRSA colonization surveillance program. It is not intended to diagnose MRSA infection nor to guide or monitor treatment for MRSA infections. Test performance is not FDA approved in patients less than 55 years old. Performed at [facility], [address], [place], [place] [address]   Culture, blood (Routine X 2) w Reflex to ID Panel     Status: None (Preliminary result)   Collection Time: 11/09/22 11:26 AM   Specimen: BLOOD  Result Value Ref Range Status   Specimen Description   Final    BLOOD BLOOD LEFT HAND Performed at [facility], [address], [place], [place] [address]    Special Requests   Final    BOTTLES DRAWN AEROBIC ONLY Blood Culture results may not be optimal due to an inadequate volume of blood received in culture bottles Performed at [facility], [address] [place], [place] [address]    Culture   Final    NO GROWTH < 24 HOURS Performed at [facility], [address], [place], [place] [address]    Report Status PENDING  Incomplete      Studies: No results found.  Scheduled Meds:  fluticasone  1 spray Each Nare Daily   leptospermum manuka honey  1 Application Topical Daily   loratadine  10 mg Oral Daily    methocarbamol  500 mg Oral BID   multivitamin with minerals  1 tablet Oral Daily   mupirocin ointment  1 Application Nasal BID   naphazoline-glycerin  1-2 drop Both Eyes Daily   nutrition supplement (JUVEN)  1 packet Oral BID BM   mouth rinse  15 mL Mouth Rinse 4 times per day   pantoprazole  40 mg Oral Daily   polyethylene glycol  17 g Oral BID   senna-docusate  1 tablet Oral BID   traZODone  100 mg Oral QHS    Continuous Infusions:  sodium chloride     lactated ringers 75 mL/hr at 11/10/22 1618   piperacillin-tazobactam (ZOSYN)  IV 12.5 mL/hr at 11/10/22 1618   vancomycin 1,000 mg (11/09/22 1934)     LOS: 2 days     Briant Cedar, MD [facility]  If 7PM-7AM, please contact night-coverage [email or web address] 11/10/2022, 5:56 PM

## 2022-11-10 NOTE — Progress Notes (Signed)
PT Cancellation Note  Patient Details Name: Jocelyn Sanchez MRN: [id number] DOB: 1956-02-03   Cancelled Treatment:    Reason Eval/Treat Not Completed: Other (comment). Pt supine in bed, reports inability to mobilize BLE. Son at bedside, reports pt lives with her mother, has caregivers, mostly remains in her hospital bed, "sometimes" is transferred to her w/c, and is active with home health for wound care. Son reports all DME needs present: hospital bed, manual w/c and hoyer lift. Both deny PT needs at home, will sign off at this time.    Tori Aleks Nawrot PT, DPT 11/10/22, 10:10 AM

## 2022-11-10 NOTE — Progress Notes (Signed)
Initial Nutrition Assessment  INTERVENTION:   Once diet advanced: -Ensure Plus High Protein po BID, each supplement provides 350 kcal and 20 grams of protein.   -Multivitamin with minerals daily  -1 packet Juven BID, each packet provides 95 calories, 2.5 grams of protein (collagen), and 9.8 grams of carbohydrate (3 grams sugar); also contains 7 grams of L-arginine and L-glutamine, 300 mg vitamin C, 15 mg vitamin E, 1.2 mcg vitamin B-12, 9.5 mg zinc, 200 mg calcium, and 1.5 g  Calcium Beta-hydroxy-Beta-methylbutyrate to support wound healing   NUTRITION DIAGNOSIS:   Increased nutrient needs related to wound healing as evidenced by estimated needs.  GOAL:   Patient will meet greater than or equal to 90% of their needs  MONITOR:   PO intake, Supplement acceptance, Labs, Weight trends, I & O's, Skin  REASON FOR ASSESSMENT:    (Wound)    ASSESSMENT:   67 y.o. female with medical history significant of multiple sclerosis with quadriparetic/bedbound status, history of chronic sacral ulcer, diabetes mellitus type 2, hypertension, history of PE on Eliquis.  Patient presents to the ED with complaints of a single episode of vomiting, burning epigastric pain with constipation.  Patient not in room at time of visit. Pt having EGD. Per chart review, plan is for pt to have clears today and advance as tolerated. Once diet advanced, recommend Ensure supplements and MVI to aid in wound healing. Will also order Juven supplements as well for wound healing. PTA pt was having vomiting and chest pain.   Per weight records, no weight loss noted.  Medications: Miralax, Senokot, Lactated ringers, KCl  Labs reviewed: Low Na   NUTRITION - FOCUSED PHYSICAL EXAM:  Unable to perform as pt was out of room  Diet Order:   Diet Order             Diet clear liquid Room service appropriate? Yes; Fluid consistency: Thin  Diet effective now                   EDUCATION NEEDS:   Not appropriate  for education at this time  Skin:  Skin Assessment: Skin Integrity Issues: Skin Integrity Issues:: Stage III, DTI, Unstageable DTI: left heel Stage III: sacrum Unstageable: right buttocks  Last BM:  5/22 -type 2  Height:   Ht Readings from Last 1 Encounters:  11/08/22 5\' 3"  (1.6 m)    Weight:   Wt Readings from Last 1 Encounters:  11/08/22 67.1 kg    BMI:  Body mass index is 26.22 kg/m.  Estimated Nutritional Needs:   Kcal:  1700-1900  Protein:  85-100g  Fluid:  1.7L/day  Tilda Franco, MS, RD, LDN Inpatient Clinical Dietitian Contact information available via Amion

## 2022-11-10 NOTE — Interval H&P Note (Signed)
History and Physical Interval Note:  11/10/2022 11:09 AM  Jocelyn Sanchez  has presented today for surgery, with the diagnosis of Abnormal CT scan; Anemia.  The various methods of treatment have been discussed with the patient and family. After consideration of risks, benefits and other options for treatment, the patient has consented to  Procedure(s): ESOPHAGOGASTRODUODENOSCOPY (EGD) WITH PROPOFOL (N/A) as a surgical intervention.  The patient's history has been reviewed, patient examined, no change in status, stable for surgery.  I have reviewed the patient's chart and labs.  Questions were answered to the patient's satisfaction.     Shirley Friar

## 2022-11-10 NOTE — Op Note (Signed)
[facility] [name] Procedure Date: 11/10/2022 MRN: [id number] Attending MD: Shirley Friar , MD, [id number] Date of Birth: 1956/05/26 CSN: [id number] Age: 67 Admit Type: Inpatient Procedure:                Upper GI endoscopy Indications:              Iron deficiency anemia secondary to chronic blood                            loss, Abnormal CT of the GI tract Providers:                Shirley Friar, MD, Pollie Friar RN, RN,                            Irene Shipper, Technician, Mirian Mo, CRNA Referring MD:             hospital team Medicines:                Propofol per Anesthesia, Monitored Anesthesia Care Complications:            No immediate complications. Estimated Blood Loss:     Estimated blood loss: none. Procedure:                Pre-Anesthesia Assessment:                           - Prior to the procedure, a History and Physical                            was performed, and patient medications and                            allergies were reviewed. The patient's tolerance of                            previous anesthesia was also reviewed. The risks                            and benefits of the procedure and the sedation                            options and risks were discussed with the patient.                            All questions were answered, and informed consent                            was obtained. Prior Anticoagulants: The patient has                            taken no anticoagulant or antiplatelet agents. ASA                            Grade Assessment: III - A patient with severe  systemic disease. After reviewing the risks and                            benefits, the patient was deemed in satisfactory                            condition to undergo the procedure.                           After obtaining informed consent, the endoscope was                            passed under  direct vision. Throughout the                            procedure, the patient's blood pressure, pulse, and                            oxygen saturations were monitored continuously. The                            [id number]-[id number] ([id number]) Olympus endoscope was introduced                            through the mouth, and advanced to the second part                            of duodenum. The upper GI endoscopy was                            accomplished without difficulty. The patient                            tolerated the procedure well. Scope In: Scope Out: Findings:      LA Grade B (one or more mucosal breaks greater than 5 mm, not extending       between the tops of two mucosal folds) esophagitis with no bleeding was       found in the distal esophagus.      The Z-line was regular and was found 35 cm from the incisors.      A 5 cm hiatal hernia was present.      Diffuse moderately erythematous mucosa without bleeding was found in the       cardia and in the gastric fundus.      Segmental mild inflammation characterized by congestion (edema) and       erythema was found in the gastric antrum.      Mild portal hypertensive gastropathy was found in the gastric fundus and       in the gastric body.      The examined duodenum was normal. Impression:               - LA Grade B reflux esophagitis with no bleeding.                           - Z-line regular, 35 cm from  the incisors.                           - 5 cm hiatal hernia.                           - Erythematous mucosa in the cardia and gastric                            fundus.                           - Gastritis.                           - Portal hypertensive gastropathy.                           - Normal examined duodenum.                           - No specimens collected. Moderate Sedation:      N/A - MAC procedure Recommendation:           - Advance diet as tolerated and clear liquid diet.                           -  Observe patient's clinical course. Procedure Code(s):        --- Professional ---                           [phone], Esophagogastroduodenoscopy, flexible,                            transoral; diagnostic, including collection of                            specimen(s) by brushing or washing, when performed                            (separate procedure) Diagnosis Code(s):        --- Professional ---                           D50.0, Iron deficiency anemia secondary to blood                            loss (chronic)                           K29.70, Gastritis, unspecified, without bleeding                           K76.6, Portal hypertension                           K31.89, Other diseases of stomach and duodenum  K44.9, Diaphragmatic hernia without obstruction or                            gangrene                           K21.00, Gastro-esophageal reflux disease with                            esophagitis, without bleeding                           R93.3, Abnormal findings on diagnostic imaging of                            other parts of digestive tract CPT copyright 2022 [employer]. All rights reserved. The codes documented in this report are preliminary and upon coder review may  be revised to meet current compliance requirements. Shirley Friar, MD 11/10/2022 11:32:49 AM This report has been signed electronically. Number of Addenda: 0

## 2022-11-10 NOTE — Brief Op Note (Signed)
No gastric mass seen. Proximal stomach inflammation likely from mucosal irritation from a hiatal hernia. Advance diet as tolerated. No further GI workup. See procedure tab for complete EGD results/recs. Will sign off. Call if questions. Dr. Ewing Schlein on call this weekend.

## 2022-11-11 DIAGNOSIS — D649 Anemia, unspecified: Secondary | ICD-10-CM | POA: Diagnosis not present

## 2022-11-11 DIAGNOSIS — E861 Hypovolemia: Secondary | ICD-10-CM | POA: Diagnosis not present

## 2022-11-11 LAB — CBC WITH DIFFERENTIAL/PLATELET
Abs Immature Granulocytes: 0.08 10*3/uL — ABNORMAL HIGH (ref 0.00–0.07)
Basophils Absolute: 0.1 10*3/uL (ref 0.0–0.1)
Basophils Relative: 1 %
Eosinophils Absolute: 0.4 10*3/uL (ref 0.0–0.5)
Eosinophils Relative: 4 %
HCT: 45.7 % (ref 36.0–46.0)
Hemoglobin: 13.9 g/dL (ref 12.0–15.0)
Immature Granulocytes: 1 %
Lymphocytes Relative: 13 %
Lymphs Abs: 1.3 10*3/uL (ref 0.7–4.0)
MCH: 25.9 pg — ABNORMAL LOW (ref 26.0–34.0)
MCHC: 30.4 g/dL (ref 30.0–36.0)
MCV: 85.3 fL (ref 80.0–100.0)
Monocytes Absolute: 0.9 10*3/uL (ref 0.1–1.0)
Monocytes Relative: 9 %
Neutro Abs: 7.5 10*3/uL (ref 1.7–7.7)
Neutrophils Relative %: 72 %
Platelets: 382 10*3/uL (ref 150–400)
RBC: 5.36 MIL/uL — ABNORMAL HIGH (ref 3.87–5.11)
RDW: 26.1 % — ABNORMAL HIGH (ref 11.5–15.5)
WBC: 10.3 10*3/uL (ref 4.0–10.5)
nRBC: 1.3 % — ABNORMAL HIGH (ref 0.0–0.2)

## 2022-11-11 MED ORDER — ACETAMINOPHEN 325 MG PO TABS
650.0000 mg | ORAL_TABLET | Freq: Four times a day (QID) | ORAL | Status: AC | PRN
  Administered 2022-11-11 – 2022-11-12 (×2): 650 mg via ORAL
  Filled 2022-11-11 (×2): qty 2

## 2022-11-11 MED ORDER — APIXABAN 5 MG PO TABS
5.0000 mg | ORAL_TABLET | Freq: Two times a day (BID) | ORAL | Status: DC
  Administered 2022-11-11 – 2022-11-14 (×7): 5 mg via ORAL
  Filled 2022-11-11 (×7): qty 1

## 2022-11-11 MED ORDER — ACETAMINOPHEN 650 MG RE SUPP: 650.0000 mg | Freq: Four times a day (QID) | RECTAL | Status: AC | PRN

## 2022-11-11 MED ORDER — DOXYCYCLINE HYCLATE 100 MG PO TABS
100.0000 mg | ORAL_TABLET | Freq: Two times a day (BID) | ORAL | Status: DC
  Administered 2022-11-11 – 2022-11-14 (×7): 100 mg via ORAL
  Filled 2022-11-11 (×7): qty 1

## 2022-11-11 NOTE — Progress Notes (Signed)
Pt refusing redraw of hemolyzed labs. MD aware

## 2022-11-11 NOTE — Progress Notes (Signed)
PROGRESS NOTE  Jocelyn Sanchez ZOX:[id number] DOB: 1955-08-19 DOA: 11/08/2022 PCP: Bethanie Dicker, FNP  HPI/Recap of past 24 hours: Jocelyn Sanchez is a 67 y.o. female with medical history significant of multiple sclerosis with quadriparetic/bedbound status, history of chronic sacral ulcer, diabetes mellitus type 2, hypertension, history of PE on Eliquis.  Patient presents to the ED with complaints of a single episode of vomiting, burning epigastric pain with constipation.  Patient reports nonbloody vomitus, denies any abdominal pain.  In the ED, patient noted to be hypotensive with SBP in the 60s to 70s range with tachycardia.  PCCM consulted.  Labs showed hemoglobin of 5.4, platelets 784, INR 2.5, FOBT negative.  Patient was given IV fluids of which patient responded well to.  Noted to have large amount of stool in the rectum which was manually disimpacted without any evidence of melena.  Due to improvement of BP with IV fluids, [facility] hospitalist was asked to admit patient.  Patient was admitted for further management.     Today, pt c/o about been stuck multiple times. Denies any new complaints    Assessment/Plan: Principal Problem:   Anemia Active Problems:   AKI (acute kidney injury) (HCC)   Sacral wound   Hypotension   Gastric wall thickening   H/O renal calculi   Constipation   Hyponatremia    Hypotension BP improved with IV fluids Likely multifactorial, UTI, significant volume depletion, AKI, severe anemia, medication induced S/p 4 units of PRBC with good response S/p IV fluids Continue to hold BP meds for now  Acute on chronic blood loss anemia Hemoglobin around 5 on admission Unsure etiology (gastritis/gastric neoplasm) Vs chronic bleeding from wound FOBT negative S/p 4 units of PRBC Anemia panel was not done prior to transfusion CT abd showed gastric wall thickening, concern for gastritis or neoplasm (??possible cause) GI consulted, s/p EGD which showed no  gastric mass, proximal stomach inflammation likely from mucosal irritation from the hiatal hernia.  No further GI workup needed. Daily CBC, transfuse if hemoglobin less than 7 Continue PPI  Chronic sacral wound Quadriplegic History of MS Appears to be ??somewhat infected Currently afebrile, with no leukocytosis BC x 2 NGTD CT abdomen pelvis without any evidence of abscess S/p vancomycin switch to doxycycline (MRSA +) and continue Zosyn Wound care consulted  ESBL UTI History of renal calculi UA showed large leukocytes, many bacteria, WBC greater than 50 UC growing >100,000 E. Coli ESBL Continue Zosyn   AKI (acute kidney injury) (HCC) Improved s/p IV fluids Daily BMP   Hyponatremia Likely 2/2 hypovolemia Continue IV fluids Daily BMP  Hypokalemia/hypomagnesemia Replace as needed  Type 2 diabetes mellitus SSI, Accu-Cheks, hypoglycemic protocol  Chronic systolic and diastolic HF Last echo done in 8119 showed EF of 40 to 45%, diffuse hypokinesis, grade 1 diastolic dysfunction Hold home Entresto/Coreg for now  Hypertension BP stable  Hold home Entresto, Coreg  History of PE Restart Eliquis and monitor closely   Constipation Was disimpacted in the ER on 5/22 Strict bowel regimen    Pressure Injury 11/08/22 Coccyx Medial Unstageable - Full thickness tissue loss in which the base of the injury is covered by slough (yellow, tan, gray, green or brown) and/or eschar (tan, brown or black) in the wound bed. Tunneling (Active)  11/08/22 2000  Location: Coccyx  Location Orientation: Medial  Staging: Unstageable - Full thickness tissue loss in which the base of the injury is covered by slough (yellow, tan, gray, green or brown) and/or eschar (tan, brown  or black) in the wound bed.  Wound Description (Comments): Tunneling  Present on Admission: Yes  Dressing Type Foam - Lift dressing to assess site every shift 11/11/22 1022     Pressure Injury 11/09/22 Heel Left Deep Tissue  Pressure Injury - Purple or maroon localized area of discolored intact skin or blood-filled blister due to damage of underlying soft tissue from pressure and/or shear. 2 cm x 3 cm (Active)  11/09/22 0700  Location: Heel  Location Orientation: Left  Staging: Deep Tissue Pressure Injury - Purple or maroon localized area of discolored intact skin or blood-filled blister due to damage of underlying soft tissue from pressure and/or shear.  Wound Description (Comments): 2 cm x 3 cm  Present on Admission:   Dressing Type Foam - Lift dressing to assess site every shift 11/11/22 0750     Pressure Injury 11/09/22 Buttocks Right Stage 3 -  Full thickness tissue loss. Subcutaneous fat may be visible but bone, tendon or muscle are NOT exposed. 2 cm x 3 cm x 1 cm w/1.4 cm undermining from 12-3 o'clock (Active)  11/09/22 0700  Location: Buttocks  Location Orientation: Right  Staging: Stage 3 -  Full thickness tissue loss. Subcutaneous fat may be visible but bone, tendon or muscle are NOT exposed.  Wound Description (Comments): 2 cm x 3 cm x 1 cm w/1.4 cm undermining from 12-3 o'clock  Present on Admission: Yes  Dressing Type Foam - Lift dressing to assess site every shift 11/11/22 1022         Estimated body mass index is 26.22 kg/m as calculated from the following:   Height as of this encounter: 5\' 3"  (1.6 m).   Weight as of this encounter: 67.1 kg.     Code Status: DNR  Family Communication: None at bedside  Disposition Plan: Status is: Inpatient Remains inpatient appropriate because: Level of care      Consultants: PCCM GI  Procedures: EGD on 5/24  Antimicrobials: Zosyn Doxycycline   DVT prophylaxis: Eliquis  Objective: Vitals:   11/10/22 1149 11/10/22 1231 11/10/22 2025 11/11/22 0603  BP: 111/63 127/65 134/80 133/80  Pulse: 88 90 96 91  Resp: 13 16 16 18   Temp:  (!) 97.3 F (36.3 C) 97.6 F (36.4 C) 97.8 F (36.6 C)  TempSrc:  Oral Oral Oral  SpO2: 100% 100% 100%  100%  Weight:      Height:        Intake/Output Summary (Last 24 hours) at 11/11/2022 1541 Last data filed at 11/11/2022 1518 Gross per 24 hour  Intake 2300.96 ml  Output 1100 ml  Net 1200.96 ml   Filed Weights   11/08/22 1200 11/08/22 1700  Weight: 67.1 kg 67.1 kg    Exam: General: NAD, awake, oriented, chronically ill-appearing Cardiovascular: S1, S2 present Respiratory: CTAB Abdomen: Soft, nontender, nondistended, bowel sounds present Musculoskeletal: No bilateral pedal edema noted Skin: Noted sacral ulcer Psychiatry: Normal mood  Neurology: Quadriparetic    Data Reviewed: CBC: Recent Labs  Lab 11/08/22 1216 11/08/22 1239 11/08/22 2121 11/09/22 1126 11/10/22 0515 11/11/22 0529  WBC 10.0  --  11.1* 9.8 9.5 10.3  NEUTROABS 7.7  --   --   --  7.1 7.5  HGB 3.6* 5.4* 7.3* 14.5 13.5 13.9  HCT 15.7* 16.0* 27.9* 47.2* 43.2 45.7  MCV 66.5*  --  77.1* 83.5 83.2 85.3  PLT 784*  --  619* 435* 471* 382   Basic Metabolic Panel: Recent Labs  Lab 11/08/22 1216 11/08/22 1239  11/08/22 2121 11/09/22 1126 11/10/22 0515  NA 128* 131* 130* 133* 134*  K 3.4* 4.4 3.5 3.3* 3.1*  CL 107 110 110 113* 116*  CO2 8*  --  8* <7* 8*  GLUCOSE 81 78 83 82 77  BUN 51* 61* 48* 40* 34*  CREATININE 1.10* 1.10* 0.91 0.89 0.81  CALCIUM 8.3*  --  8.0* 7.9* 7.6*  MG 1.5*  --   --  1.5* 1.9   GFR: Estimated Creatinine Clearance: 61.2 mL/min (by C-G formula based on SCr of 0.81 mg/dL). Liver Function Tests: Recent Labs  Lab 11/08/22 1216  AST 5*  ALT 7  ALKPHOS 82  BILITOT 0.9  PROT 7.5  ALBUMIN 2.9*   Recent Labs  Lab 11/08/22 1216  LIPASE 88*   No results for input(s): "AMMONIA" in the last 168 hours. Coagulation Profile: Recent Labs  Lab 11/08/22 1216  INR 2.5*   Cardiac Enzymes: No results for input(s): "CKTOTAL", "CKMB", "CKMBINDEX", "TROPONINI" in the last 168 hours. BNP (last 3 results) No results for input(s): "PROBNP" in the last 8760 hours. HbA1C: No  results for input(s): "HGBA1C" in the last 72 hours. CBG: No results for input(s): "GLUCAP" in the last 168 hours. Lipid Profile: No results for input(s): "CHOL", "HDL", "LDLCALC", "TRIG", "CHOLHDL", "LDLDIRECT" in the last 72 hours. Thyroid Function Tests: No results for input(s): "TSH", "T4TOTAL", "FREET4", "T3FREE", "THYROIDAB" in the last 72 hours. Anemia Panel: No results for input(s): "VITAMINB12", "FOLATE", "FERRITIN", "TIBC", "IRON", "RETICCTPCT" in the last 72 hours. Urine analysis:    Component Value Date/Time   COLORURINE YELLOW 11/08/2022 1357   APPEARANCEUR TURBID (A) 11/08/2022 1357   LABSPEC 1.012 11/08/2022 1357   PHURINE 6.0 11/08/2022 1357   GLUCOSEU NEGATIVE 11/08/2022 1357   HGBUR MODERATE (A) 11/08/2022 1357   BILIRUBINUR NEGATIVE 11/08/2022 1357   KETONESUR 5 (A) 11/08/2022 1357   PROTEINUR 100 (A) 11/08/2022 1357   UROBILINOGEN 0.2 03/01/2015 1913   NITRITE NEGATIVE 11/08/2022 1357   LEUKOCYTESUR LARGE (A) 11/08/2022 1357   Sepsis Labs: @LABRCNTIP (procalcitonin:4,lacticidven:4)  ) Recent Results (from the past 240 hour(s))  Urine Culture     Status: Abnormal   Collection Time: 11/08/22  1:57 PM   Specimen: Urine, Clean Catch  Result Value Ref Range Status   Specimen Description   Final    URINE, CLEAN CATCH Performed at [facility], [address], [place], [place] [address]    Special Requests   Final    NONE Reflexed from [id number] Performed at [facility], [address], [place], [place] [address]    Culture (A)  Final    >=100,000 COLONIES/mL ESCHERICHIA COLI Confirmed Extended Spectrum Beta-Lactamase Producer (ESBL).  In bloodstream infections from ESBL organisms, carbapenems are preferred over piperacillin/tazobactam. They are shown to have a lower risk of mortality.    Report Status 11/10/2022 FINAL  Final   Organism ID, Bacteria ESCHERICHIA COLI (A)  Final      Susceptibility   Escherichia coli - MIC*     AMPICILLIN >=32 RESISTANT Resistant     CEFAZOLIN >=64 RESISTANT Resistant     CEFEPIME 16 RESISTANT Resistant     CIPROFLOXACIN >=4 RESISTANT Resistant     GENTAMICIN <=1 SENSITIVE Sensitive     IMIPENEM <=0.25 SENSITIVE Sensitive     NITROFURANTOIN <=16 SENSITIVE Sensitive     TRIMETH/SULFA <=20 SENSITIVE Sensitive     AMPICILLIN/SULBACTAM >=32 RESISTANT Resistant     PIP/TAZO 16 SENSITIVE Sensitive     * >=  100,000 COLONIES/mL ESCHERICHIA COLI  MRSA Next Gen by PCR, Nasal     Status: Abnormal   Collection Time: 11/09/22  8:43 AM   Specimen: Nasal Mucosa; Nasal Swab  Result Value Ref Range Status   MRSA by PCR Next Gen DETECTED (A) NOT DETECTED Final    Comment: (NOTE) The GeneXpert MRSA Assay (FDA approved for NASAL specimens only), is one component of a comprehensive MRSA colonization surveillance program. It is not intended to diagnose MRSA infection nor to guide or monitor treatment for MRSA infections. Test performance is not FDA approved in patients less than 51 years old. Performed at [facility], [address], [place], [place] [address]   Culture, blood (Routine X 2) w Reflex to ID Panel     Status: None (Preliminary result)   Collection Time: 11/09/22 11:26 AM   Specimen: BLOOD  Result Value Ref Range Status   Specimen Description   Final    BLOOD BLOOD LEFT HAND Performed at [facility], [address] [place], [place] [address]    Special Requests   Final    BOTTLES DRAWN AEROBIC ONLY Blood Culture results may not be optimal due to an inadequate volume of blood received in culture bottles Performed at [facility], [address], [place], [place] [address]    Culture   Final    NO GROWTH 2 DAYS Performed at [facility], [address] [place], [place] [address]    Report Status PENDING  Incomplete  Culture, blood (Routine X 2) w Reflex to ID Panel     Status: None (Preliminary result)    Collection Time: 11/09/22  9:45 PM   Specimen: BLOOD  Result Value Ref Range Status   Specimen Description   Final    BLOOD BLOOD LEFT HAND Performed at [facility], [address], [place], [place] [address]    Special Requests   Final    BOTTLES DRAWN AEROBIC ONLY Blood Culture results may not be optimal due to an inadequate volume of blood received in culture bottles Performed at [facility], [address] [place], [place] [address]    Culture   Final    NO GROWTH 1 DAY Performed at [facility], [address], [place], [place] [address]    Report Status PENDING  Incomplete      Studies: No results found.  Scheduled Meds:  apixaban  5 mg Oral BID   doxycycline  100 mg Oral Q12H   fluticasone  1 spray Each Nare Daily   leptospermum manuka honey  1 Application Topical Daily   loratadine  10 mg Oral Daily   methocarbamol  500 mg Oral BID   multivitamin with minerals  1 tablet Oral Daily   mupirocin ointment  1 Application Nasal BID   naphazoline-glycerin  1-2 drop Both Eyes Daily   nutrition supplement (JUVEN)  1 packet Oral BID BM   mouth rinse  15 mL Mouth Rinse 4 times per day   pantoprazole  40 mg Oral Daily   polyethylene glycol  17 g Oral BID   senna-docusate  1 tablet Oral BID   traZODone  100 mg Oral QHS    Continuous Infusions:  sodium chloride     piperacillin-tazobactam (ZOSYN)  IV 12.5 mL/hr at 11/11/22 1518     LOS: 3 days     Briant Cedar, MD [facility]  If 7PM-7AM, please contact night-coverage  [email or web address] 11/11/2022, 3:41 PM

## 2022-11-12 DIAGNOSIS — E861 Hypovolemia: Secondary | ICD-10-CM | POA: Diagnosis not present

## 2022-11-12 DIAGNOSIS — D649 Anemia, unspecified: Secondary | ICD-10-CM | POA: Diagnosis not present

## 2022-11-12 LAB — BASIC METABOLIC PANEL
Anion gap: 7 (ref 5–15)
BUN: 31 mg/dL — ABNORMAL HIGH (ref 8–23)
CO2: 11 mmol/L — ABNORMAL LOW (ref 22–32)
Calcium: 8 mg/dL — ABNORMAL LOW (ref 8.9–10.3)
Chloride: 118 mmol/L — ABNORMAL HIGH (ref 98–111)
Creatinine, Ser: 0.7 mg/dL (ref 0.44–1.00)
GFR, Estimated: >60 mL/min (ref >60)
Glucose, Bld: 100 mg/dL — ABNORMAL HIGH (ref 70–99)
Potassium: 3.7 mmol/L (ref 3.5–5.1)
Sodium: 136 mmol/L (ref 135–145)

## 2022-11-12 LAB — CBC WITH DIFFERENTIAL/PLATELET
Abs Immature Granulocytes: 0.08 10*3/uL — ABNORMAL HIGH (ref 0.00–0.07)
Basophils Absolute: 0.1 10*3/uL (ref 0.0–0.1)
Basophils Relative: 1 %
Eosinophils Absolute: 0.5 10*3/uL (ref 0.0–0.5)
Eosinophils Relative: 5 %
HCT: 44.4 % (ref 36.0–46.0)
Hemoglobin: 13.6 g/dL (ref 12.0–15.0)
Immature Granulocytes: 1 %
Lymphocytes Relative: 16 %
Lymphs Abs: 1.8 10*3/uL (ref 0.7–4.0)
MCH: 26.2 pg (ref 26.0–34.0)
MCHC: 30.6 g/dL (ref 30.0–36.0)
MCV: 85.5 fL (ref 80.0–100.0)
Monocytes Absolute: 1.1 10*3/uL — ABNORMAL HIGH (ref 0.1–1.0)
Monocytes Relative: 9 %
Neutro Abs: 7.7 10*3/uL (ref 1.7–7.7)
Neutrophils Relative %: 68 %
Platelets: 411 10*3/uL — ABNORMAL HIGH (ref 150–400)
RBC: 5.19 MIL/uL — ABNORMAL HIGH (ref 3.87–5.11)
RDW: 26.3 % — ABNORMAL HIGH (ref 11.5–15.5)
WBC: 11.2 10*3/uL — ABNORMAL HIGH (ref 4.0–10.5)
nRBC: 1.1 % — ABNORMAL HIGH (ref 0.0–0.2)

## 2022-11-12 LAB — MAGNESIUM: Magnesium: 1.8 mg/dL (ref 1.7–2.4)

## 2022-11-12 LAB — CULTURE, BLOOD (ROUTINE X 2): Culture: NO GROWTH

## 2022-11-12 MED ORDER — POLYETHYLENE GLYCOL 3350 17 G PO PACK
17.0000 g | PACK | Freq: Every day | ORAL | Status: DC
  Administered 2022-11-13 – 2022-11-14 (×2): 17 g via ORAL
  Filled 2022-11-12 (×2): qty 1

## 2022-11-12 MED ORDER — ACETAMINOPHEN 325 MG PO TABS
650.0000 mg | ORAL_TABLET | Freq: Once | ORAL | Status: AC
  Administered 2022-11-12: 650 mg via ORAL
  Filled 2022-11-12: qty 2

## 2022-11-12 MED ORDER — CARVEDILOL 3.125 MG PO TABS: 3.1250 mg | ORAL_TABLET | Freq: Every morning | ORAL | Status: DC

## 2022-11-12 MED ORDER — SENNOSIDES-DOCUSATE SODIUM 8.6-50 MG PO TABS: 1.0000 | ORAL_TABLET | Freq: Every day | ORAL | Status: DC

## 2022-11-12 MED ORDER — CARVEDILOL 6.25 MG PO TABS
6.2500 mg | ORAL_TABLET | Freq: Every day | ORAL | Status: DC
  Administered 2022-11-13 – 2022-11-14 (×2): 6.25 mg via ORAL
  Filled 2022-11-12 (×2): qty 1

## 2022-11-12 NOTE — Progress Notes (Signed)
PROGRESS NOTE  Jocelyn Sanchez ZOX:[id number] DOB: 02/08/1956 DOA: 11/08/2022 PCP: Jocelyn Dicker, FNP  HPI/Recap of past 24 hours: Jocelyn Sanchez is a 67 y.o. female with medical history significant of multiple sclerosis with quadriparetic/bedbound status, history of chronic sacral ulcer, diabetes mellitus type 2, hypertension, history of PE on Eliquis.  Patient presents to the ED with complaints of a single episode of vomiting, burning epigastric pain with constipation.  Patient reports nonbloody vomitus, denies any abdominal pain.  In the ED, patient noted to be hypotensive with SBP in the 60s to 70s range with tachycardia.  PCCM consulted.  Labs showed hemoglobin of 5.4, platelets 784, INR 2.5, FOBT negative.  Patient was given IV fluids of which patient responded well to.  Noted to have large amount of stool in the rectum which was manually disimpacted without any evidence of melena.  Due to improvement of BP with IV fluids, [facility] hospitalist was asked to admit patient.  Patient was admitted for further management.     Today, denies any new complaints.    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  No further GI workup needed. Previous small bowel enteroscopy showed gastric and duodenal AVMs which can cause a very brisk bleed Daily CBC, transfuse if hemoglobin less than 7 Continue PPI  Chronic sacral wound Quadriplegic History of MS Appears to be infected Currently afebrile, with leukocytosis BC x 2 NGTD CT abdomen pelvis without any evidence of abscess S/p vancomycin switch to doxycycline (MRSA +) and continue Zosyn Wound care consulted  ESBL UTI History of renal calculi UA showed large leukocytes, many bacteria, WBC greater than 50 UC growing >100,000 E. Coli ESBL Continue Zosyn   AKI (acute kidney injury) (HCC) Hx of metabolic acidosis (chronic) Improved s/p IV fluids Daily BMP   Hyponatremia Likely 2/2 hypovolemia Daily BMP  Hypokalemia/hypomagnesemia Replace as needed  Type 2 diabetes mellitus SSI, Accu-Cheks, hypoglycemic protocol  Chronic systolic and diastolic HF Last echo done in 8119 showed EF of 40 to 45%, diffuse hypokinesis, grade 1 diastolic dysfunction Hold home Entresto for now Restart Coreg  Hypertension BP stable  Hold home Entresto, restart Coreg  History of PE Restart Eliquis and monitor closely   Constipation Was disimpacted in the ER on 5/22 Strict bowel regimen    Pressure Injury 11/08/22 Coccyx Medial Unstageable - Full thickness tissue loss in which the base of the injury is covered by slough (yellow, tan, gray, green or brown) and/or eschar (tan, brown or black) in the wound bed. Tunneling (Active)  11/08/22 2000  Location: Coccyx  Location Orientation: Medial  Staging: Unstageable - Full thickness tissue loss in which the base of the injury is covered by  slough (yellow, tan, gray, green or brown) and/or eschar (tan, brown or black) in the wound bed.  Wound Description (Comments): Tunneling  Present on Admission: Yes  Dressing Type Foam - Lift dressing to assess site every shift;Gauze (Comment) 11/11/22 2100      Pressure Injury 11/09/22 Heel Left Deep Tissue Pressure Injury - Purple or maroon localized area of discolored intact skin or blood-filled blister due to damage of underlying soft tissue from pressure and/or shear. 2 cm x 3 cm (Active)  11/09/22 0700  Location: Heel  Location Orientation: Left  Staging: Deep Tissue Pressure Injury - Purple or maroon localized area of discolored intact skin or blood-filled blister due to damage of underlying soft tissue from pressure and/or shear.  Wound Description (Comments): 2 cm x 3 cm  Present on Admission:   Dressing Type Foam - Lift dressing to assess site every shift 11/12/22 0945     Pressure Injury 11/09/22 Buttocks Right Stage 3 -  Full thickness tissue loss. Subcutaneous fat may be visible but bone, tendon or muscle are NOT exposed. 2 cm x 3 cm x 1 cm w/1.4 cm undermining from 12-3 o'clock (Active)  11/09/22 0700  Location: Buttocks  Location Orientation: Right  Staging: Stage 3 -  Full thickness tissue loss. Subcutaneous fat may be visible but bone, tendon or muscle are NOT exposed.  Wound Description (Comments): 2 cm x 3 cm x 1 cm w/1.4 cm undermining from 12-3 o'clock  Present on Admission: Yes  Dressing Type Foam - Lift dressing to assess site every shift 11/12/22 0945         Estimated body mass index is 26.22 kg/m as calculated from the following:   Height as of this encounter: 5\' 3"  (1.6 m).   Weight as of this encounter: 67.1 kg.     Code Status: DNR  Family Communication: None at bedside  Disposition Plan: Status is: Inpatient Remains inpatient appropriate because: Level of care      Consultants: PCCM GI  Procedures: EGD on 5/24  Antimicrobials: Zosyn Doxycycline   DVT prophylaxis: Eliquis  Objective: Vitals:   11/11/22 0603 11/11/22 1345 11/12/22 0521 11/12/22 1242  BP: 133/80 (!) 144/85 122/71 (!) 142/78  Pulse: 91 (!) 104 (!) 101 86  Resp: 18 18 17 16   Temp: 97.8 F (36.6 C) 97.6 F (36.4 C)  98.7 F (37.1 C) 97.7 F (36.5 C)  TempSrc: Oral Oral Oral Oral  SpO2: 100% 100% 100% 100%  Weight:      Height:        Intake/Output Summary (Last 24 hours) at 11/12/2022 1703 Last data filed at 11/12/2022 1330 Gross per 24 hour  Intake 600 ml  Output 600 ml  Net 0 ml   Filed Weights   11/08/22 1200 11/08/22 1700  Weight: 67.1 kg 67.1 kg    Exam: General: NAD, awake, oriented, chronically ill-appearing Cardiovascular: S1, S2 present Respiratory: CTAB Abdomen: Soft, nontender, nondistended, bowel sounds present Musculoskeletal: No bilateral pedal edema noted Skin: Noted sacral ulcer Psychiatry: Normal mood  Neurology: Quadriparetic    Data Reviewed: CBC: Recent Labs  Lab 11/08/22 1216 11/08/22 1239 11/08/22 2121 11/09/22 1126 11/10/22 0515 11/11/22 0529 11/12/22 0531  WBC 10.0  --  11.1* 9.8 9.5 10.3 11.2*  NEUTROABS 7.7  --   --   --  7.1 7.5 7.7  HGB 3.6*   < > 7.3* 14.5 13.5 13.9 13.6  HCT 15.7*   < > 27.9* 47.2* 43.2 45.7 44.4  MCV 66.5*  --  77.1* 83.5 83.2 85.3 85.5  PLT 784*  --  619* 435* 471* 382 411*   < > = values in this interval not displayed.   Basic Metabolic Panel: Recent Labs  Lab 11/08/22 1216 11/08/22 1239 11/08/22 2121 11/09/22 1126 11/10/22 0515 11/12/22 0531  NA 128* 131* 130* 133* 134* 136  K 3.4* 4.4 3.5 3.3* 3.1* 3.7  CL 107 110 110 113* 116* 118*  CO2 8*  --  8* <7* 8* 11*  GLUCOSE 81 78 83 82 77 100*  BUN 51* 61* 48* 40* 34* 31*  CREATININE 1.10* 1.10* 0.91 0.89 0.81 0.70  CALCIUM 8.3*  --  8.0* 7.9* 7.6* 8.0*  MG 1.5*  --   --  1.5* 1.9 1.8   GFR: Estimated Creatinine Clearance: 61.9 mL/min (by C-G formula based on SCr of 0.7 mg/dL). Liver Function Tests: Recent Labs  Lab 11/08/22 1216  AST 5*  ALT 7  ALKPHOS 82  BILITOT 0.9  PROT 7.5  ALBUMIN 2.9*   Recent Labs  Lab 11/08/22 1216  LIPASE 88*   No results for input(s): "AMMONIA" in the last 168 hours. Coagulation Profile: Recent Labs  Lab  11/08/22 1216  INR 2.5*   Cardiac Enzymes: No results for input(s): "CKTOTAL", "CKMB", "CKMBINDEX", "TROPONINI" in the last 168 hours. BNP (last 3 results) No results for input(s): "PROBNP" in the last 8760 hours. HbA1C: No results for input(s): "HGBA1C" in the last 72 hours. CBG: No results for input(s): "GLUCAP" in the last 168 hours. Lipid Profile: No results for input(s): "CHOL", "HDL", "LDLCALC", "TRIG", "CHOLHDL", "LDLDIRECT" in the last 72 hours. Thyroid Function Tests: No results for input(s): "TSH", "T4TOTAL", "FREET4", "T3FREE", "THYROIDAB" in the last 72 hours. Anemia Panel: No results for input(s): "VITAMINB12", "FOLATE", "FERRITIN", "TIBC", "IRON", "RETICCTPCT" in the last 72 hours. Urine analysis:    Component Value Date/Time   COLORURINE YELLOW 11/08/2022 1357   APPEARANCEUR TURBID (A) 11/08/2022 1357   LABSPEC 1.012 11/08/2022 1357   PHURINE 6.0 11/08/2022 1357   GLUCOSEU NEGATIVE 11/08/2022 1357   HGBUR MODERATE (A) 11/08/2022 1357   BILIRUBINUR NEGATIVE 11/08/2022 1357   KETONESUR 5 (A) 11/08/2022 1357   PROTEINUR 100 (A) 11/08/2022 1357   UROBILINOGEN 0.2 03/01/2015 1913   NITRITE NEGATIVE 11/08/2022 1357   LEUKOCYTESUR LARGE (A) 11/08/2022 1357   Sepsis Labs: @LABRCNTIP (procalcitonin:4,lacticidven:4)  ) Recent Results (from the past 240 hour(s))  Urine Culture     Status: Abnormal   Collection Time: 11/08/22  1:57 PM   Specimen: Urine, Clean Catch  Result Value Ref Range Status   Specimen Description   Final    URINE, CLEAN CATCH Performed at [facility], [address], [place], [place] [address]    Special Requests   Final    NONE Reflexed from [id number] Performed at [facility], [address], [place], [place] [address]    Culture (A)  Final    >=100,000 COLONIES/mL ESCHERICHIA COLI Confirmed Extended Spectrum Beta-Lactamase Producer (ESBL).  In bloodstream infections from ESBL organisms, carbapenems are preferred  over piperacillin/tazobactam. They are shown to have a lower risk of mortality.    Report Status 11/10/2022 FINAL  Final   Organism ID, Bacteria ESCHERICHIA COLI (A)  Final      Susceptibility   Escherichia coli - MIC*    AMPICILLIN >=32 RESISTANT Resistant     CEFAZOLIN >=64 RESISTANT Resistant     CEFEPIME 16 RESISTANT Resistant     CIPROFLOXACIN >=4 RESISTANT Resistant  GENTAMICIN <=1 SENSITIVE Sensitive     IMIPENEM <=0.25 SENSITIVE Sensitive     NITROFURANTOIN <=16 SENSITIVE Sensitive     TRIMETH/SULFA <=20 SENSITIVE Sensitive     AMPICILLIN/SULBACTAM >=32 RESISTANT Resistant     PIP/TAZO 16 SENSITIVE Sensitive     * >=100,000 COLONIES/mL ESCHERICHIA COLI  MRSA Next Gen by PCR, Nasal     Status: Abnormal   Collection Time: 11/09/22  8:43 AM   Specimen: Nasal Mucosa; Nasal Swab  Result Value Ref Range Status   MRSA by PCR Next Gen DETECTED (A) NOT DETECTED Final    Comment: (NOTE) The GeneXpert MRSA Assay (FDA approved for NASAL specimens only), is one component of a comprehensive MRSA colonization surveillance program. It is not intended to diagnose MRSA infection nor to guide or monitor treatment for MRSA infections. Test performance is not FDA approved in patients less than 79 years old. Performed at [facility], [address] [place], [place] [address]   Culture, blood (Routine X 2) w Reflex to ID Panel     Status: None (Preliminary result)   Collection Time: 11/09/22 11:26 AM   Specimen: BLOOD  Result Value Ref Range Status   Specimen Description   Final    BLOOD BLOOD LEFT HAND Performed at [facility], [address], [place], [place] [address]    Special Requests   Final    BOTTLES DRAWN AEROBIC ONLY Blood Culture results may not be optimal due to an inadequate volume of blood received in culture bottles Performed at [facility], [address] [place], [place] [address]    Culture   Final    NO  GROWTH 3 DAYS Performed at [facility], [address] [place], [place] [address]    Report Status PENDING  Incomplete  Culture, blood (Routine X 2) w Reflex to ID Panel     Status: None (Preliminary result)   Collection Time: 11/09/22  9:45 PM   Specimen: BLOOD  Result Value Ref Range Status   Specimen Description   Final    BLOOD BLOOD LEFT HAND Performed at [facility], [address] [place], [place] [address]    Special Requests   Final    BOTTLES DRAWN AEROBIC ONLY Blood Culture results may not be optimal due to an inadequate volume of blood received in culture bottles Performed at [facility], [address], [place], [place] [address]    Culture   Final    NO GROWTH 2 DAYS Performed at [facility], [address], [place], [place] [address]    Report Status PENDING  Incomplete      Studies: No results found.  Scheduled Meds:  apixaban  5 mg Oral BID   doxycycline  100 mg Oral Q12H   fluticasone  1 spray Each Nare Daily   leptospermum manuka honey  1 Application Topical Daily   loratadine  10 mg Oral Daily   methocarbamol  500 mg Oral BID   multivitamin with minerals  1 tablet Oral Daily   mupirocin ointment  1 Application Nasal BID   naphazoline-glycerin  1-2 drop Both Eyes Daily   nutrition supplement (JUVEN)  1 packet Oral BID BM   mouth rinse  15 mL Mouth Rinse 4 times per day   pantoprazole  40 mg Oral Daily   polyethylene glycol  17 g Oral BID   senna-docusate  1 tablet Oral BID   traZODone  100 mg Oral QHS    Continuous Infusions:  sodium chloride     piperacillin-tazobactam (ZOSYN)  IV 3.375 g (11/12/22 1501)     LOS: 4 days     Briant Cedar, MD [facility]  If 7PM-7AM, please contact night-coverage [email or web address] 11/12/2022, 5:03 PM

## 2022-11-13 ENCOUNTER — Encounter (HOSPITAL_COMMUNITY): Payer: Self-pay | Admitting: Gastroenterology

## 2022-11-13 DIAGNOSIS — D649 Anemia, unspecified: Secondary | ICD-10-CM | POA: Diagnosis not present

## 2022-11-13 LAB — CBC WITH DIFFERENTIAL/PLATELET
Abs Immature Granulocytes: 0.08 10*3/uL — ABNORMAL HIGH (ref 0.00–0.07)
Basophils Absolute: 0.2 10*3/uL — ABNORMAL HIGH (ref 0.0–0.1)
Basophils Relative: 2 %
Eosinophils Absolute: 0.7 10*3/uL — ABNORMAL HIGH (ref 0.0–0.5)
Eosinophils Relative: 7 %
HCT: 41.2 % (ref 36.0–46.0)
Hemoglobin: 13.4 g/dL (ref 12.0–15.0)
Immature Granulocytes: 1 %
Lymphocytes Relative: 22 %
Lymphs Abs: 2.2 10*3/uL (ref 0.7–4.0)
MCH: 26.3 pg (ref 26.0–34.0)
MCHC: 32.5 g/dL (ref 30.0–36.0)
MCV: 80.8 fL (ref 80.0–100.0)
Monocytes Absolute: 1.2 10*3/uL — ABNORMAL HIGH (ref 0.1–1.0)
Monocytes Relative: 12 %
Neutro Abs: 5.6 10*3/uL (ref 1.7–7.7)
Neutrophils Relative %: 56 %
Platelets: 447 10*3/uL — ABNORMAL HIGH (ref 150–400)
RBC: 5.1 MIL/uL (ref 3.87–5.11)
RDW: 26.2 % — ABNORMAL HIGH (ref 11.5–15.5)
WBC: 9.9 10*3/uL (ref 4.0–10.5)
nRBC: 0.5 % — ABNORMAL HIGH (ref 0.0–0.2)

## 2022-11-13 LAB — BASIC METABOLIC PANEL
Anion gap: 7 (ref 5–15)
BUN: 29 mg/dL — ABNORMAL HIGH (ref 8–23)
CO2: 12 mmol/L — ABNORMAL LOW (ref 22–32)
Calcium: 8.1 mg/dL — ABNORMAL LOW (ref 8.9–10.3)
Chloride: 117 mmol/L — ABNORMAL HIGH (ref 98–111)
Creatinine, Ser: 0.69 mg/dL (ref 0.44–1.00)
GFR, Estimated: >60 mL/min (ref >60)
Glucose, Bld: 76 mg/dL (ref 70–99)
Potassium: 4.7 mmol/L (ref 3.5–5.1)
Sodium: 136 mmol/L (ref 135–145)

## 2022-11-13 LAB — CULTURE, BLOOD (ROUTINE X 2)

## 2022-11-13 MED ORDER — ACETAMINOPHEN 325 MG PO TABS
650.0000 mg | ORAL_TABLET | Freq: Once | ORAL | Status: AC
  Administered 2022-11-13: 650 mg via ORAL
  Filled 2022-11-13: qty 2

## 2022-11-13 MED ORDER — ACETAMINOPHEN 500 MG PO TABS
500.0000 mg | ORAL_TABLET | Freq: Four times a day (QID) | ORAL | Status: DC | PRN
  Administered 2022-11-13: 500 mg via ORAL
  Filled 2022-11-13: qty 1

## 2022-11-13 MED ORDER — SODIUM BICARBONATE 650 MG PO TABS
650.0000 mg | ORAL_TABLET | Freq: Three times a day (TID) | ORAL | Status: DC
  Administered 2022-11-13 – 2022-11-14 (×4): 650 mg via ORAL
  Filled 2022-11-13 (×4): qty 1

## 2022-11-13 NOTE — Plan of Care (Signed)
  Problem: Education: Goal: Knowledge of General Education information will improve Description: Including pain rating scale, medication(s)/side effects and non-pharmacologic comfort measures Outcome: Adequate for Discharge   Problem: Health Behavior/Discharge Planning: Goal: Ability to manage health-related needs will improve Outcome: Adequate for Discharge   Problem: Clinical Measurements: Goal: Ability to maintain clinical measurements within normal limits will improve Outcome: Adequate for Discharge   Problem: Skin Integrity: Goal: Risk for impaired skin integrity will decrease Outcome: Adequate for Discharge   

## 2022-11-13 NOTE — Progress Notes (Signed)
PROGRESS NOTE  Jocelyn Sanchez:[id number] DOB: 1955-08-12 DOA: 11/08/2022 PCP: Bethanie Dicker, FNP  HPI/Recap of past 24 hours: Jocelyn Sanchez is a 67 y.o. female with medical history significant of multiple sclerosis with quadriparetic/bedbound status, history of chronic sacral ulcer, diabetes mellitus type 2, hypertension, history of PE on Eliquis.  Patient presents to the ED with complaints of a single episode of vomiting, burning epigastric pain with constipation.  Patient reports nonbloody vomitus, denies any abdominal pain.  In the ED, patient noted to be hypotensive with SBP in the 60s to 70s range with tachycardia.  PCCM consulted.  Labs showed hemoglobin of 5.4, platelets 784, INR 2.5, FOBT negative.  Patient was given IV fluids of which patient responded well to.  Noted to have large amount of stool in the rectum which was manually disimpacted without any evidence of melena.  Due to improvement of BP with IV fluids, [facility] hospitalist was asked to admit patient.  Patient was admitted for further management.     Patient denies any new complaints today.  Occasionally does have indigestion as per patient.    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  No further GI workup needed. Previous small bowel enteroscopy showed gastric and duodenal AVMs which can cause a very brisk bleed Daily CBC, transfuse if hemoglobin less than 7 Continue PPI  Chronic sacral wound Quadriplegic History of MS Appears to be infected Currently afebrile, with leukocytosis BC x 2 NGTD CT abdomen pelvis without any evidence of abscess S/p vancomycin switch to doxycycline (MRSA +) and continue Zosyn Wound care consulted  ESBL UTI History of renal calculi UA showed large leukocytes, many bacteria, WBC greater than 50 UC growing >100,000 E. Coli ESBL Continue Zosyn   AKI (acute kidney injury) (HCC) Hx of metabolic acidosis (chronic) Improved s/p IV fluids Start p.o. bicarbonate Daily BMP   Hyponatremia Likely 2/2 hypovolemia Daily BMP  Hypokalemia/hypomagnesemia Replace as needed  Type 2 diabetes mellitus SSI, Accu-Cheks, hypoglycemic protocol  Chronic systolic and diastolic HF Last echo done in 8119 showed EF of 40 to 45%, diffuse hypokinesis, grade 1 diastolic dysfunction Hold home Entresto for now Restart Coreg  Hypertension BP stable  Hold home Entresto, restart Coreg  History of PE Restart Eliquis and monitor closely   Constipation Was disimpacted in the ER on 5/22 Strict bowel regimen    Pressure Injury 11/08/22 Coccyx Medial Unstageable - Full thickness tissue loss in which the base of the injury is covered by slough (yellow, tan, gray, green or brown) and/or eschar (tan, brown or black) in the wound bed. Tunneling (Active)  11/08/22 2000  Location: Coccyx  Location Orientation: Medial  Staging: Unstageable - Full thickness  tissue loss in which the base of the injury is covered by slough (yellow, tan, gray, green or brown) and/or eschar (tan, brown or black) in the wound bed.  Wound Description (Comments): Tunneling  Present on Admission: Yes  Dressing  Type Foam - Lift dressing to assess site every shift 11/13/22 0930     Pressure Injury 11/09/22 Heel Left Deep Tissue Pressure Injury - Purple or maroon localized area of discolored intact skin or blood-filled blister due to damage of underlying soft tissue from pressure and/or shear. 2 cm x 3 cm (Active)  11/09/22 0700  Location: Heel  Location Orientation: Left  Staging: Deep Tissue Pressure Injury - Purple or maroon localized area of discolored intact skin or blood-filled blister due to damage of underlying soft tissue from pressure and/or shear.  Wound Description (Comments): 2 cm x 3 cm  Present on Admission:   Dressing Type Foam - Lift dressing to assess site every shift 11/13/22 0930     Pressure Injury 11/09/22 Buttocks Right Stage 3 -  Full thickness tissue loss. Subcutaneous fat may be visible but bone, tendon or muscle are NOT exposed. 2 cm x 3 cm x 1 cm w/1.4 cm undermining from 12-3 o'clock (Active)  11/09/22 0700  Location: Buttocks  Location Orientation: Right  Staging: Stage 3 -  Full thickness tissue loss. Subcutaneous fat may be visible but bone, tendon or muscle are NOT exposed.  Wound Description (Comments): 2 cm x 3 cm x 1 cm w/1.4 cm undermining from 12-3 o'clock  Present on Admission: Yes  Dressing Type Foam - Lift dressing to assess site every shift 11/13/22 0930         Estimated body mass index is 26.22 kg/m as calculated from the following:   Height as of this encounter: 5\' 3"  (1.6 m).   Weight as of this encounter: 67.1 kg.     Code Status: DNR  Family Communication: None at bedside  Disposition Plan: Status is: Inpatient Remains inpatient appropriate because: Level of care      Consultants: PCCM GI  Procedures: EGD on 5/24  Antimicrobials: Zosyn Doxycycline   DVT prophylaxis: Eliquis  Objective: Vitals:   11/12/22 1242 11/12/22 2009 11/13/22 0449 11/13/22 1236  BP: (!) 142/78 136/83 132/82 139/80  Pulse: 86 80 79 79  Resp: 16  16 16 16   Temp: 97.7 F (36.5 C) 97.8 F (36.6 C) 97.7 F (36.5 C) (!) 97.5 F (36.4 C)  TempSrc: Oral Oral Oral Oral  SpO2: 100% 100% 100% 100%  Weight:      Height:        Intake/Output Summary (Last 24 hours) at 11/13/2022 1621 Last data filed at 11/13/2022 1500 Gross per 24 hour  Intake 890 ml  Output 825 ml  Net 65 ml   Filed Weights   11/08/22 1200 11/08/22 1700  Weight: 67.1 kg 67.1 kg    Exam: General: NAD, awake, oriented, chronically ill-appearing Cardiovascular: S1, S2 present Respiratory: CTAB Abdomen: Soft, nontender, nondistended, bowel sounds present Musculoskeletal: No bilateral pedal edema noted Skin: Noted sacral ulcer Psychiatry: Normal mood  Neurology: Quadriparetic    Data Reviewed: CBC: Recent Labs  Lab 11/08/22 1216 11/08/22 1239 11/09/22 1126 11/10/22 0515 11/11/22 0529 11/12/22 0531 11/13/22 0442  WBC 10.0   < > 9.8 9.5 10.3 11.2* 9.9  NEUTROABS 7.7  --   --  7.1 7.5 7.7 5.6  HGB 3.6*   < > 14.5 13.5 13.9 13.6 13.4  HCT 15.7*   < >  47.2* 43.2 45.7 44.4 41.2  MCV 66.5*   < > 83.5 83.2 85.3 85.5 80.8  PLT 784*   < > 435* 471* 382 411* 447*   < > = values in this interval not displayed.   Basic Metabolic Panel: Recent Labs  Lab 11/08/22 1216 11/08/22 1239 11/08/22 2121 11/09/22 1126 11/10/22 0515 11/12/22 0531 11/13/22 0442  NA 128*   < > 130* 133* 134* 136 136  K 3.4*   < > 3.5 3.3* 3.1* 3.7 4.7  CL 107   < > 110 113* 116* 118* 117*  CO2 8*  --  8* <7* 8* 11* 12*  GLUCOSE 81   < > 83 82 77 100* 76  BUN 51*   < > 48* 40* 34* 31* 29*  CREATININE 1.10*   < > 0.91 0.89 0.81 0.70 0.69  CALCIUM 8.3*  --  8.0* 7.9* 7.6* 8.0* 8.1*  MG 1.5*  --   --  1.5* 1.9 1.8  --    < > = values in this interval not displayed.   GFR: Estimated Creatinine Clearance: 61.9 mL/min (by C-G formula based on SCr of 0.69 mg/dL). Liver Function Tests: Recent Labs  Lab 11/08/22 1216  AST 5*  ALT 7  ALKPHOS 82  BILITOT 0.9  PROT 7.5  ALBUMIN  2.9*   Recent Labs  Lab 11/08/22 1216  LIPASE 88*   No results for input(s): "AMMONIA" in the last 168 hours. Coagulation Profile: Recent Labs  Lab 11/08/22 1216  INR 2.5*   Cardiac Enzymes: No results for input(s): "CKTOTAL", "CKMB", "CKMBINDEX", "TROPONINI" in the last 168 hours. BNP (last 3 results) No results for input(s): "PROBNP" in the last 8760 hours. HbA1C: No results for input(s): "HGBA1C" in the last 72 hours. CBG: No results for input(s): "GLUCAP" in the last 168 hours. Lipid Profile: No results for input(s): "CHOL", "HDL", "LDLCALC", "TRIG", "CHOLHDL", "LDLDIRECT" in the last 72 hours. Thyroid Function Tests: No results for input(s): "TSH", "T4TOTAL", "FREET4", "T3FREE", "THYROIDAB" in the last 72 hours. Anemia Panel: No results for input(s): "VITAMINB12", "FOLATE", "FERRITIN", "TIBC", "IRON", "RETICCTPCT" in the last 72 hours. Urine analysis:    Component Value Date/Time   COLORURINE YELLOW 11/08/2022 1357   APPEARANCEUR TURBID (A) 11/08/2022 1357   LABSPEC 1.012 11/08/2022 1357   PHURINE 6.0 11/08/2022 1357   GLUCOSEU NEGATIVE 11/08/2022 1357   HGBUR MODERATE (A) 11/08/2022 1357   BILIRUBINUR NEGATIVE 11/08/2022 1357   KETONESUR 5 (A) 11/08/2022 1357   PROTEINUR 100 (A) 11/08/2022 1357   UROBILINOGEN 0.2 03/01/2015 1913   NITRITE NEGATIVE 11/08/2022 1357   LEUKOCYTESUR LARGE (A) 11/08/2022 1357   Sepsis Labs: @LABRCNTIP (procalcitonin:4,lacticidven:4)  ) Recent Results (from the past 240 hour(s))  Urine Culture     Status: Abnormal   Collection Time: 11/08/22  1:57 PM   Specimen: Urine, Clean Catch  Result Value Ref Range Status   Specimen Description   Final    URINE, CLEAN CATCH Performed at [facility], [address], [place], [place] [address]    Special Requests   Final    NONE Reflexed from [id number] Performed at [facility], [address] [place], [place] [address]    Culture (A)  Final    >=100,000  COLONIES/mL ESCHERICHIA COLI Confirmed Extended Spectrum Beta-Lactamase Producer (ESBL).  In bloodstream infections from ESBL organisms, carbapenems are preferred over piperacillin/tazobactam. They are shown to have a lower risk of mortality.    Report Status 11/10/2022 FINAL  Final  Organism ID, Bacteria ESCHERICHIA COLI (A)  Final      Susceptibility   Escherichia coli - MIC*    AMPICILLIN >=32 RESISTANT Resistant     CEFAZOLIN >=64 RESISTANT Resistant     CEFEPIME 16 RESISTANT Resistant     CIPROFLOXACIN >=4 RESISTANT Resistant     GENTAMICIN <=1 SENSITIVE Sensitive     IMIPENEM <=0.25 SENSITIVE Sensitive     NITROFURANTOIN <=16 SENSITIVE Sensitive     TRIMETH/SULFA <=20 SENSITIVE Sensitive     AMPICILLIN/SULBACTAM >=32 RESISTANT Resistant     PIP/TAZO 16 SENSITIVE Sensitive     * >=100,000 COLONIES/mL ESCHERICHIA COLI  MRSA Next Gen by PCR, Nasal     Status: Abnormal   Collection Time: 11/09/22  8:43 AM   Specimen: Nasal Mucosa; Nasal Swab  Result Value Ref Range Status   MRSA by PCR Next Gen DETECTED (A) NOT DETECTED Final    Comment: (NOTE) The GeneXpert MRSA Assay (FDA approved for NASAL specimens only), is one component of a comprehensive MRSA colonization surveillance program. It is not intended to diagnose MRSA infection nor to guide or monitor treatment for MRSA infections. Test performance is not FDA approved in patients less than 77 years old. Performed at [facility], [address] [place], [place] [address]   Culture, blood (Routine X 2) w Reflex to ID Panel     Status: None (Preliminary result)   Collection Time: 11/09/22 11:26 AM   Specimen: BLOOD  Result Value Ref Range Status   Specimen Description   Final    BLOOD BLOOD LEFT HAND Performed at [facility], [address], [place], [place] [address]    Special Requests   Final    BOTTLES DRAWN AEROBIC ONLY Blood Culture results may not be optimal due to an  inadequate volume of blood received in culture bottles Performed at [facility], [address], [place], [place] [address]    Culture   Final    NO GROWTH 4 DAYS Performed at [facility], [address], [place], [place] [address]    Report Status PENDING  Incomplete  Culture, blood (Routine X 2) w Reflex to ID Panel     Status: None (Preliminary result)   Collection Time: 11/09/22  9:45 PM   Specimen: BLOOD  Result Value Ref Range Status   Specimen Description   Final    BLOOD BLOOD LEFT HAND Performed at [facility], [address], [place], [place] [address]    Special Requests   Final    BOTTLES DRAWN AEROBIC ONLY Blood Culture results may not be optimal due to an inadequate volume of blood received in culture bottles Performed at [facility], [address], [place], [place] [address]    Culture   Final    NO GROWTH 3 DAYS Performed at [facility], [address], [place], [place] [address]    Report Status PENDING  Incomplete      Studies: No results found.  Scheduled Meds:  apixaban  5 mg Oral BID   carvedilol  6.25 mg Oral Daily   doxycycline  100 mg Oral Q12H   fluticasone  1 spray Each Nare Daily   leptospermum manuka honey  1 Application Topical Daily   loratadine  10 mg Oral Daily   methocarbamol  500 mg Oral BID   multivitamin with minerals  1 tablet Oral Daily   mupirocin ointment  1 Application  Nasal BID   naphazoline-glycerin  1-2 drop Both Eyes Daily   nutrition supplement (JUVEN)  1 packet Oral BID BM   mouth rinse  15 mL Mouth Rinse 4 times per day   pantoprazole  40 mg Oral Daily   polyethylene glycol  17 g Oral Daily   senna-docusate  1 tablet Oral QHS   sodium bicarbonate  650 mg Oral TID   traZODone  100 mg Oral QHS    Continuous Infusions:  sodium chloride     piperacillin-tazobactam (ZOSYN)  IV 3.375 g (11/13/22 1440)     LOS: 5 days     Briant Cedar,  MD [facility]  If 7PM-7AM, please contact night-coverage [email or web address] 11/13/2022, 4:21 PM

## 2022-11-13 NOTE — Progress Notes (Signed)
Patient sister, listed in chart, would like a call prior to discharge to ensure she's at home awaiting her arrival.

## 2022-11-14 DIAGNOSIS — D649 Anemia, unspecified: Secondary | ICD-10-CM | POA: Diagnosis not present

## 2022-11-14 LAB — CULTURE, BLOOD (ROUTINE X 2)

## 2022-11-14 MED ORDER — ADULT MULTIVITAMIN W/MINERALS CH: 1.0000 | ORAL_TABLET | Freq: Every day | ORAL | Status: DC

## 2022-11-14 MED ORDER — JUVEN PO PACK: 1.0000 | PACK | Freq: Two times a day (BID) | ORAL | 0 refills | Status: DC

## 2022-11-14 MED ORDER — POLYETHYLENE GLYCOL 3350 17 G PO PACK: 17.0000 g | PACK | Freq: Every day | ORAL | 0 refills | Status: DC

## 2022-11-14 MED ORDER — SENNOSIDES-DOCUSATE SODIUM 8.6-50 MG PO TABS: 1.0000 | ORAL_TABLET | Freq: Every day | ORAL | 0 refills | Status: DC

## 2022-11-14 NOTE — Discharge Summary (Signed)
Physician Discharge Summary   Patient: Jocelyn Sanchez MRN: [id number] DOB: 04-11-1956  Admit date:     11/08/2022  Discharge date: 11/14/22  Discharge Physician: Briant Cedar   PCP: Bethanie Dicker, FNP   Recommendations at discharge:   Follow-up with PCP in 1 week  Discharge Diagnoses: Principal Problem:   Anemia Active Problems:   AKI (acute kidney injury) (HCC)   Sacral wound   Hypotension   Gastric wall thickening   H/O renal calculi   Constipation   Hyponatremia    Hospital Course: Jocelyn Sanchez is a 67 y.o. female with medical history significant of multiple sclerosis with quadriparetic/bedbound status, history of chronic sacral ulcer, diabetes mellitus type 2, hypertension, history of PE on Eliquis.  Patient presents to the ED with complaints of a single episode of vomiting, burning epigastric pain with constipation.  Patient reports nonbloody vomitus, denies any abdominal pain.  In the ED, patient noted to be hypotensive with SBP in the 60s to 70s range with tachycardia.  PCCM consulted.  Labs showed hemoglobin of 5.4, platelets 784, INR 2.5, FOBT negative.  Patient was given IV fluids of which patient responded well to.  Noted to have large amount of stool in the rectum which was manually disimpacted without any evidence of melena.  Due to improvement of BP with IV fluids, [facility] hospitalist was asked to admit patient.  Patient was admitted for further management.    Today, patient denies any new complaints.  Very eager to be discharged.  Follow-up with PCP in 1 week.  Assessment and Plan:  Hypotension BP improved with IV fluids Likely multifactorial, UTI, significant volume depletion, AKI, severe anemia, medication induced S/p 4 units of PRBC with good response S/p IV fluids   Acute on chronic blood loss anemia Hx of gastric and duodenal AVMs- likely 2/2 to this Hemoglobin around 5 on admission FOBT negative S/p 4 units of PRBC Anemia panel was not done  prior to transfusion CT abd showed gastric wall thickening, concern for gastritis or neoplasm GI consulted, s/p EGD which showed no gastric mass, proximal stomach inflammation likely from mucosal irritation from the hiatal hernia.  No further GI workup needed. Previous small bowel enteroscopy showed gastric and duodenal AVMs which can cause a very brisk bleed Continue PPI   Chronic sacral wound Quadriplegic History of MS Appears to be infected Currently afebrile, with no leukocytosis BC x 2 NGTD CT abdomen pelvis without any evidence of abscess Completed vancomycin-->doxycycline (MRSA +) and Zosyn for a total of 7 days Outpt follow up with wound care   ESBL UTI History of renal calculi UA showed large leukocytes, many bacteria, WBC greater than 50 UC growing >100,000 E. Coli ESBL Completed Zosyn    AKI (acute kidney injury) (HCC) Hx of metabolic acidosis (chronic) Improved s/p IV fluids S/p p.o. bicarbonate   Hyponatremia Likely 2/2 hypovolemia   Hypokalemia/hypomagnesemia Replaced as needed   Type 2 diabetes mellitus Continue home regimen   Chronic systolic and diastolic HF Last echo done in 4098 showed EF of 40 to 45%, diffuse hypokinesis, grade 1 diastolic dysfunction Restart home Entresto, Coreg   Hypertension BP stable  Restart Entresto, Coreg   History of PE Restart Eliquis and monitor closely   Constipation Was disimpacted in the ER on 5/22 Strict bowel regimen      Consultants: None Procedures performed: None Disposition: Home Diet recommendation:  Cardiac and Carb modified diet   DISCHARGE MEDICATION: Allergies as of 11/14/2022  Reactions   Sulfa Antibiotics Anaphylaxis, Shortness Of Breath, Swelling   Chlorhexidine Other (See Comments)   Reaction not recalled        Medication List     TAKE these medications    acetaminophen 500 MG tablet Commonly known as: TYLENOL Take 500-1,000 mg by mouth See admin instructions. Take  1,000 mg by mouth at bedtime and an additional 500-1,000 mg once a day as needed for pain or headaches   carvedilol 6.25 MG tablet Commonly known as: COREG Take 6.25 mg by mouth in the morning.   CLEAR EYES MAXIMUM ITCHY EYE OP Place 1 drop into both eyes in the morning.   [employer] D3 50 MCG (2000 UT) Caps Generic drug: Cholecalciferol Take 2,000 Units by mouth daily.   Eliquis 5 MG Tabs tablet Generic drug: apixaban Take 5 mg by mouth 2 (two) times daily.   Entresto 24-26 MG Generic drug: sacubitril-valsartan Take 1 tablet by mouth 2 (two) times daily.   fluticasone 50 MCG/ACT nasal spray Commonly known as: FLONASE Place 1 spray into both nostrils in the morning.   ketoconazole 2 % cream Commonly known as: NIZORAL Apply 1 Application topically 2 (two) times daily as needed for irritation (affected finger).   levocetirizine 5 MG tablet Commonly known as: XYZAL Take 5 mg by mouth every evening.   liver oil-zinc oxide 40 % ointment Commonly known as: DESITIN Apply 1 application topically as needed for irritation. What changed: reasons to take this   methocarbamol 500 MG tablet Commonly known as: ROBAXIN Take 500 mg by mouth in the morning and at bedtime.   multivitamin with minerals Tabs tablet Take 1 tablet by mouth daily. Start taking on: Nov 15, 2022   nutrition supplement (JUVEN) Pack Take 1 packet by mouth 2 (two) times daily between meals.   omeprazole 20 MG capsule Commonly known as: PRILOSEC Take 1 capsule (20 mg total) by mouth 2 (two) times daily before a meal. What changed: when to take this   polyethylene glycol 17 g packet Commonly known as: MIRALAX / GLYCOLAX Take 17 g by mouth daily. Start taking on: Nov 15, 2022   senna-docusate 8.6-50 MG tablet Commonly known as: Senokot-S Take 1 tablet by mouth at bedtime.   traZODone 100 MG tablet Commonly known as: DESYREL Take 100 mg by mouth at bedtime.   vitamin C 1000 MG tablet Take 1,000 mg by  mouth daily.   Zinc Sulfate 220 (50 Zn) MG Tabs Take 220 mg by mouth daily.        Follow-up Information     Issifu, Georgann Housekeeper, FNP. Schedule an appointment as soon as possible for a visit in 1 week(s).   Specialty: Family Medicine Contact information: [address] [place] [address] [phone]                Discharge Exam: Ceasar Mons Weights   11/08/22 1200 11/08/22 1700  Weight: 67.1 kg 67.1 kg   General: NAD Cardiovascular: S1, S2 present Respiratory: CTAB Abdomen: Soft, nontender, nondistended, bowel sounds present Musculoskeletal: No bilateral pedal edema noted Skin: Normal Psychiatry: Normal mood  Neurology: Quadriparetic  Condition at discharge: stable  The results of significant diagnostics from this hospitalization (including imaging, microbiology, ancillary and laboratory) are listed below for reference.   Imaging Studies: CT CHEST ABDOMEN PELVIS WO CONTRAST  Result Date: 11/08/2022 CLINICAL DATA:  Heartburn. Vomiting x1. Constipated. Hypotensive. Unintended weight loss. Multiple sclerosis. Diabetes. EXAM: CT CHEST, ABDOMEN AND PELVIS WITHOUT CONTRAST TECHNIQUE: Multidetector  CT imaging of the chest, abdomen and pelvis was performed following the standard protocol without IV contrast. RADIATION DOSE REDUCTION: This exam was performed according to the departmental dose-optimization program which includes automated exposure control, adjustment of the mA and/or kV according to patient size and/or use of iterative reconstruction technique. COMPARISON:  07/26/2019 abdominopelvic CT. Most recent chest CT 10/26/2015. FINDINGS: CT CHEST FINDINGS Cardiovascular: Multifactorial degradation, including lack of IV contrast, patient arm position, not raised above the head. Wire and lead artifacts. Aortic atherosclerosis. No aortic aneurysm. Tortuous thoracic aorta. Normal heart size, without pericardial effusion. Left main and 3 vessel coronary artery  calcification. Mediastinum/Nodes: No mediastinal or hilar adenopathy, given limitations of unenhanced CT. Lungs/Pleura: No pleural fluid. Subpleural 3 mm right lower lobe pulmonary nodule on 57/6 can be presumed benign and do/does not warrant imaging follow-up per Fleischner criteria. Minimal dependent atelectasis in the left lower lobe. Musculoskeletal: Anasarca.  No acute osseous abnormality. CT ABDOMEN PELVIS FINDINGS Hepatobiliary: Artifact degradation continuing into the upper abdomen. Grossly normal noncontrast appearance of the liver, gallbladder. Pancreas: Mild pancreatic atrophy. No duct dilatation or acute inflammation. Spleen: Normal in size, without focal abnormality. Adrenals/Urinary Tract: Normal adrenal glands. 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 The largest right-sided stone is in the pelvis a 2.7 cm on 63/2. Left-sided pelvic stone or conglomerate of stones measures 2.5 cm on 59/2. The stone burden is relatively similar to 07/26/2019 CT. Possible upper pole caliectasis bilaterally, suboptimally evaluated. No ureteric stones are identified. Both ureters are mildly prominent throughout. The bladder appears mildly thick walled but is underdistended. Stomach/Bowel: Apparent proximal stomach wall thickening including on 47/2. Large stool ball in the rectum. Normal terminal ileum and appendix.  Normal small bowel. Vascular/Lymphatic: Advanced aortic and branch vessel atherosclerosis. No abdominopelvic adenopathy. Reproductive: Uterine calcifications are likely due to underlying fibroids. No adnexal mass. Other: No significant free fluid. No free intraperitoneal air. Anasarca slightly asymmetric and greater on the left. Musculoskeletal: Soft tissue thickening within the subcutaneous fat superficial to the right sacroiliac joint on 89/2. Heterogeneous marrow density is likely due to osteopenia and possible renal osteodystrophy. Lumbosacral spondylosis. Trace L4-5 anterolisthesis. Convex  left lumbar spine curvature is mild. IMPRESSION: 1. Multifactorial degradation, as detailed above. 2.  No acute process in the chest. 3. 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 Probable upper pole caliectasis bilaterally, suboptimally evaluated. 4. Apparent proximal gastric wall thickening could be due to underdistention. Given the history of weight loss, correlate with symptoms of gastritis or gastric neoplasm. Consider endoscopy. 5. Stool burden in the rectum suggests fecal impaction 6. Skin thickening superficial the right sacroiliac joint could represent cellulitis or scarring. No well-defined ulcer. Consider physical exam correlation. 7. Coronary artery atherosclerosis. Aortic Atherosclerosis (ICD10-I70.0). Electronically Signed   By: Jeronimo Greaves M.D.   On: 11/08/2022 14:46   DG Chest Port 1 View  Result Date: 11/08/2022 CLINICAL DATA:  Questionable sepsis.  Evaluate for abnormality. EXAM: PORTABLE CHEST 1 VIEW COMPARISON:  Radiographs 08/10/2020 and 08/06/2020. FINDINGS: 1359 hours. The heart size and mediastinal contours are stable with mild aortic atherosclerosis. The lungs are clear. There is no pleural effusion or pneumothorax. No acute osseous findings are evident. There is a mild thoracic scoliosis. Telemetry leads overlie the chest. IMPRESSION: No evidence of active cardiopulmonary process. Aortic atherosclerosis. Electronically Signed   By: Carey Bullocks M.D.   On: 11/08/2022 14:41    Microbiology: Results for orders placed or performed during the hospital encounter of  11/08/22  Urine Culture     Status: Abnormal   Collection Time: 11/08/22  1:57 PM   Specimen: Urine, Clean Catch  Result Value Ref Range Status   Specimen Description   Final    URINE, CLEAN CATCH Performed at [facility], [address] [place], [place] [address]    Special Requests   Final    NONE Reflexed from [id number] Performed at [facility], [address] [place], [place] [address]    Culture (A)  Final    >=100,000 COLONIES/mL ESCHERICHIA COLI Confirmed Extended Spectrum Beta-Lactamase Producer (ESBL).  In bloodstream infections from ESBL organisms, carbapenems are preferred over piperacillin/tazobactam. They are shown to have a lower risk of mortality.    Report Status 11/10/2022 FINAL  Final   Organism ID, Bacteria ESCHERICHIA COLI (A)  Final      Susceptibility   Escherichia coli - MIC*    AMPICILLIN >=32 RESISTANT Resistant     CEFAZOLIN >=64 RESISTANT Resistant     CEFEPIME 16 RESISTANT Resistant     CIPROFLOXACIN >=4 RESISTANT Resistant     GENTAMICIN <=1 SENSITIVE Sensitive     IMIPENEM <=0.25 SENSITIVE Sensitive     NITROFURANTOIN <=16 SENSITIVE Sensitive     TRIMETH/SULFA <=20 SENSITIVE Sensitive     AMPICILLIN/SULBACTAM >=32 RESISTANT Resistant     PIP/TAZO 16 SENSITIVE Sensitive     * >=100,000 COLONIES/mL ESCHERICHIA COLI  MRSA Next Gen by PCR, Nasal     Status: Abnormal   Collection Time: 11/09/22  8:43 AM   Specimen: Nasal Mucosa; Nasal Swab  Result Value Ref Range Status   MRSA by PCR Next Gen DETECTED (A) NOT DETECTED Final    Comment: (NOTE) The GeneXpert MRSA Assay (FDA approved for NASAL specimens only), is one component of a comprehensive MRSA colonization surveillance program. It is not intended to diagnose MRSA infection nor to guide or monitor treatment for MRSA infections. Test performance is not FDA approved in patients less than 90 years old. Performed at [facility], [address] [place], [place] [address]   Culture, blood (Routine X 2) w Reflex to ID Panel     Status: None   Collection Time: 11/09/22 11:26 AM   Specimen: BLOOD  Result Value Ref Range Status   Specimen Description   Final    BLOOD BLOOD LEFT HAND Performed at [facility], [address] [place], [place] [address]    Special Requests   Final     BOTTLES DRAWN AEROBIC ONLY Blood Culture results may not be optimal due to an inadequate volume of blood received in culture bottles Performed at [facility], [address] [place], [place] [address]    Culture   Final    NO GROWTH 5 DAYS Performed at [facility], [address], [place], [place] [address]    Report Status 11/14/2022 FINAL  Final  Culture, blood (Routine X 2) w Reflex to ID Panel     Status: None (Preliminary result)   Collection Time: 11/09/22  9:45 PM   Specimen: BLOOD  Result Value Ref Range Status   Specimen Description   Final    BLOOD BLOOD LEFT HAND Performed at [facility], [address], [place], [place] [address]    Special Requests   Final    BOTTLES DRAWN AEROBIC ONLY Blood Culture results may not be optimal due to an inadequate volume of blood received in  culture bottles Performed at [facility], [address] [place], [place] [address]    Culture   Final    NO GROWTH 4 DAYS Performed at [facility], [address] [place], [place] [address]    Report Status PENDING  Incomplete    Labs: CBC: Recent Labs  Lab 11/08/22 1216 11/08/22 1239 11/09/22 1126 11/10/22 0515 11/11/22 0529 11/12/22 0531 11/13/22 0442  WBC 10.0   < > 9.8 9.5 10.3 11.2* 9.9  NEUTROABS 7.7  --   --  7.1 7.5 7.7 5.6  HGB 3.6*   < > 14.5 13.5 13.9 13.6 13.4  HCT 15.7*   < > 47.2* 43.2 45.7 44.4 41.2  MCV 66.5*   < > 83.5 83.2 85.3 85.5 80.8  PLT 784*   < > 435* 471* 382 411* 447*   < > = values in this interval not displayed.   Basic Metabolic Panel: Recent Labs  Lab 11/08/22 1216 11/08/22 1239 11/08/22 2121 11/09/22 1126 11/10/22 0515 11/12/22 0531 11/13/22 0442  NA 128*   < > 130* 133* 134* 136 136  K 3.4*   < > 3.5 3.3* 3.1* 3.7 4.7  CL 107   < > 110 113* 116* 118* 117*  CO2 8*  --  8* <7* 8* 11* 12*  GLUCOSE 81   < > 83 82 77 100* 76  BUN 51*   < > 48* 40* 34* 31* 29*   CREATININE 1.10*   < > 0.91 0.89 0.81 0.70 0.69  CALCIUM 8.3*  --  8.0* 7.9* 7.6* 8.0* 8.1*  MG 1.5*  --   --  1.5* 1.9 1.8  --    < > = values in this interval not displayed.   Liver Function Tests: Recent Labs  Lab 11/08/22 1216  AST 5*  ALT 7  ALKPHOS 82  BILITOT 0.9  PROT 7.5  ALBUMIN 2.9*   CBG: No results for input(s): "GLUCAP" in the last 168 hours.  Discharge time spent: greater than 30 minutes.  Signed: Briant Cedar, MD [facility] 11/14/2022

## 2022-11-14 NOTE — Care Management Important Message (Signed)
Important Message  Patient Details IM Letter given. Name: Jocelyn Sanchez MRN: [id number] Date of Birth: 1956/03/06   Medicare Important Message Given:  Yes     Caren Macadam 11/14/2022, 11:37 AM

## 2022-11-14 NOTE — TOC Transition Note (Signed)
Transition of Care [facility]) - CM/SW Discharge Note   Patient Details  Name: Jocelyn Sanchez MRN: [id number] Date of Birth: 1955/07/10  Transition of Care [facility]) CM/SW Contact:  Howell Rucks, RN Phone Number: 11/14/2022, 2:16 PM   Clinical Narrative:  DC to home today, [facility] called for transport home. No further TOC needs identified.     Final next level of care: Home/Self Care Barriers to Discharge: Barriers Resolved   Patient Goals and CMS Choice      Discharge Placement                      Patient and family notified of of transfer: 11/14/22  Discharge Plan and Services Additional resources added to the After Visit Summary for                                       Social Determinants of Health (SDOH) Interventions SDOH Screenings   Housing: Patient Unable To Answer (11/09/2022)  Tobacco Use: Medium Risk (11/13/2022)     Readmission Risk Interventions    11/14/2022    2:15 PM  Readmission Risk Prevention Plan  Transportation Screening Complete  PCP or Specialist Appt within 5-7 Days Complete  Home Care Screening Complete  Medication Review (RN CM) Complete

## 2022-11-15 LAB — CULTURE, BLOOD (ROUTINE X 2)

## 2022-11-16 ENCOUNTER — Emergency Department (HOSPITAL_COMMUNITY): Payer: Medicare PPO

## 2022-11-16 ENCOUNTER — Inpatient Hospital Stay (HOSPITAL_COMMUNITY)
Admission: EM | Admit: 2022-11-16 | Discharge: 2022-12-04 | DRG: 100 | Disposition: A | Discharge status: Hospice | Payer: Medicare PPO | Attending: Internal Medicine | Admitting: Internal Medicine

## 2022-11-16 ENCOUNTER — Other Ambulatory Visit: Payer: Self-pay

## 2022-11-16 DIAGNOSIS — G8250 Quadriplegia, unspecified: Secondary | ICD-10-CM | POA: Diagnosis present

## 2022-11-16 DIAGNOSIS — Z515 Encounter for palliative care: Secondary | ICD-10-CM | POA: Diagnosis not present

## 2022-11-16 DIAGNOSIS — E872 Acidosis, unspecified: Secondary | ICD-10-CM | POA: Diagnosis present

## 2022-11-16 DIAGNOSIS — R1319 Other dysphagia: Secondary | ICD-10-CM | POA: Diagnosis not present

## 2022-11-16 DIAGNOSIS — D638 Anemia in other chronic diseases classified elsewhere: Secondary | ICD-10-CM | POA: Diagnosis present

## 2022-11-16 DIAGNOSIS — I110 Hypertensive heart disease with heart failure: Secondary | ICD-10-CM | POA: Diagnosis present

## 2022-11-16 DIAGNOSIS — R569 Unspecified convulsions: Secondary | ICD-10-CM | POA: Diagnosis not present

## 2022-11-16 DIAGNOSIS — G936 Cerebral edema: Secondary | ICD-10-CM

## 2022-11-16 DIAGNOSIS — G40901 Epilepsy, unspecified, not intractable, with status epilepticus: Secondary | ICD-10-CM | POA: Diagnosis present

## 2022-11-16 DIAGNOSIS — Z79899 Other long term (current) drug therapy: Secondary | ICD-10-CM

## 2022-11-16 DIAGNOSIS — L89022 Pressure ulcer of left elbow, stage 2: Secondary | ICD-10-CM | POA: Diagnosis present

## 2022-11-16 DIAGNOSIS — G928 Other toxic encephalopathy: Secondary | ICD-10-CM | POA: Diagnosis present

## 2022-11-16 DIAGNOSIS — Z7189 Other specified counseling: Secondary | ICD-10-CM | POA: Diagnosis not present

## 2022-11-16 DIAGNOSIS — Z888 Allergy status to other drugs, medicaments and biological substances status: Secondary | ICD-10-CM

## 2022-11-16 DIAGNOSIS — R6510 Systemic inflammatory response syndrome (SIRS) of non-infectious origin without acute organ dysfunction: Secondary | ICD-10-CM | POA: Diagnosis present

## 2022-11-16 DIAGNOSIS — I890 Lymphedema, not elsewhere classified: Secondary | ICD-10-CM | POA: Diagnosis present

## 2022-11-16 DIAGNOSIS — R609 Edema, unspecified: Secondary | ICD-10-CM | POA: Diagnosis not present

## 2022-11-16 DIAGNOSIS — R401 Stupor: Secondary | ICD-10-CM

## 2022-11-16 DIAGNOSIS — E1151 Type 2 diabetes mellitus with diabetic peripheral angiopathy without gangrene: Secondary | ICD-10-CM | POA: Diagnosis present

## 2022-11-16 DIAGNOSIS — Z1612 Extended spectrum beta lactamase (ESBL) resistance: Secondary | ICD-10-CM | POA: Diagnosis present

## 2022-11-16 DIAGNOSIS — N390 Urinary tract infection, site not specified: Secondary | ICD-10-CM | POA: Diagnosis present

## 2022-11-16 DIAGNOSIS — Z66 Do not resuscitate: Secondary | ICD-10-CM | POA: Diagnosis present

## 2022-11-16 DIAGNOSIS — G35 Multiple sclerosis: Secondary | ICD-10-CM | POA: Diagnosis present

## 2022-11-16 DIAGNOSIS — L89153 Pressure ulcer of sacral region, stage 3: Secondary | ICD-10-CM | POA: Diagnosis present

## 2022-11-16 DIAGNOSIS — G9341 Metabolic encephalopathy: Secondary | ICD-10-CM | POA: Diagnosis present

## 2022-11-16 DIAGNOSIS — Z820 Family history of epilepsy and other diseases of the nervous system: Secondary | ICD-10-CM

## 2022-11-16 DIAGNOSIS — Z87891 Personal history of nicotine dependence: Secondary | ICD-10-CM | POA: Diagnosis not present

## 2022-11-16 DIAGNOSIS — E876 Hypokalemia: Secondary | ICD-10-CM | POA: Diagnosis not present

## 2022-11-16 DIAGNOSIS — Z87440 Personal history of urinary (tract) infections: Secondary | ICD-10-CM

## 2022-11-16 DIAGNOSIS — S31000A Unspecified open wound of lower back and pelvis without penetration into retroperitoneum, initial encounter: Secondary | ICD-10-CM | POA: Diagnosis present

## 2022-11-16 DIAGNOSIS — Z7901 Long term (current) use of anticoagulants: Secondary | ICD-10-CM

## 2022-11-16 DIAGNOSIS — I5042 Chronic combined systolic (congestive) and diastolic (congestive) heart failure: Secondary | ICD-10-CM | POA: Diagnosis present

## 2022-11-16 DIAGNOSIS — L89313 Pressure ulcer of right buttock, stage 3: Secondary | ICD-10-CM | POA: Diagnosis present

## 2022-11-16 DIAGNOSIS — R319 Hematuria, unspecified: Secondary | ICD-10-CM | POA: Diagnosis present

## 2022-11-16 DIAGNOSIS — Z7401 Bed confinement status: Secondary | ICD-10-CM

## 2022-11-16 DIAGNOSIS — Z882 Allergy status to sulfonamides status: Secondary | ICD-10-CM

## 2022-11-16 DIAGNOSIS — L89626 Pressure-induced deep tissue damage of left heel: Secondary | ICD-10-CM | POA: Diagnosis present

## 2022-11-16 DIAGNOSIS — R627 Adult failure to thrive: Secondary | ICD-10-CM | POA: Diagnosis not present

## 2022-11-16 DIAGNOSIS — Z86711 Personal history of pulmonary embolism: Secondary | ICD-10-CM

## 2022-11-16 LAB — URINALYSIS, ROUTINE W REFLEX MICROSCOPIC
Bilirubin Urine: NEGATIVE
Glucose, UA: NEGATIVE mg/dL
Ketones, ur: NEGATIVE mg/dL
Nitrite: NEGATIVE
Protein, ur: 100 mg/dL — AB
RBC / HPF: >50 RBC/hpf (ref 0–5)
Specific Gravity, Urine: 1.011 (ref 1.005–1.030)
WBC, UA: >50 WBC/hpf (ref 0–5)
pH: 6 (ref 5.0–8.0)

## 2022-11-16 LAB — I-STAT ARTERIAL BLOOD GAS, ED
Acid-base deficit: 12 mmol/L — ABNORMAL HIGH (ref 0.0–2.0)
Bicarbonate: 15.6 mmol/L — ABNORMAL LOW (ref 20.0–28.0)
Calcium, Ion: 1.35 mmol/L (ref 1.15–1.40)
HCT: 46 % (ref 36.0–46.0)
Hemoglobin: 15.6 g/dL — ABNORMAL HIGH (ref 12.0–15.0)
O2 Saturation: 95 %
Patient temperature: 99
Potassium: 3.1 mmol/L — ABNORMAL LOW (ref 3.5–5.1)
Sodium: 143 mmol/L (ref 135–145)
TCO2: 17 mmol/L — ABNORMAL LOW (ref 22–32)
pCO2 arterial: 42.5 mmHg (ref 32–48)
pH, Arterial: 7.174 — CL (ref 7.35–7.45)
pO2, Arterial: 97 mmHg (ref 83–108)

## 2022-11-16 LAB — COMPREHENSIVE METABOLIC PANEL
ALT: 17 U/L (ref 0–44)
AST: 47 U/L — ABNORMAL HIGH (ref 15–41)
Albumin: 2.4 g/dL — ABNORMAL LOW (ref 3.5–5.0)
Alkaline Phosphatase: 100 U/L (ref 38–126)
Anion gap: 10 (ref 5–15)
BUN: 26 mg/dL — ABNORMAL HIGH (ref 8–23)
CO2: 13 mmol/L — ABNORMAL LOW (ref 22–32)
Calcium: 8.3 mg/dL — ABNORMAL LOW (ref 8.9–10.3)
Chloride: 115 mmol/L — ABNORMAL HIGH (ref 98–111)
Creatinine, Ser: 0.87 mg/dL (ref 0.44–1.00)
GFR, Estimated: >60 mL/min (ref >60)
Glucose, Bld: 108 mg/dL — ABNORMAL HIGH (ref 70–99)
Potassium: 3.6 mmol/L (ref 3.5–5.1)
Sodium: 138 mmol/L (ref 135–145)
Total Bilirubin: 0.4 mg/dL (ref 0.3–1.2)
Total Protein: 6.9 g/dL (ref 6.5–8.1)

## 2022-11-16 LAB — CBC WITH DIFFERENTIAL/PLATELET
Abs Immature Granulocytes: 0 10*3/uL (ref 0.00–0.07)
Basophils Absolute: 0.6 10*3/uL — ABNORMAL HIGH (ref 0.0–0.1)
Basophils Relative: 3 %
Eosinophils Absolute: 0.2 10*3/uL (ref 0.0–0.5)
Eosinophils Relative: 1 %
HCT: 48.6 % — ABNORMAL HIGH (ref 36.0–46.0)
Hemoglobin: 14.7 g/dL (ref 12.0–15.0)
Lymphocytes Relative: 14 %
Lymphs Abs: 2.6 10*3/uL (ref 0.7–4.0)
MCH: 25.7 pg — ABNORMAL LOW (ref 26.0–34.0)
MCHC: 30.2 g/dL (ref 30.0–36.0)
MCV: 85 fL (ref 80.0–100.0)
Monocytes Absolute: 0.9 10*3/uL (ref 0.1–1.0)
Monocytes Relative: 5 %
Neutro Abs: 14.3 10*3/uL — ABNORMAL HIGH (ref 1.7–7.7)
Neutrophils Relative %: 77 %
Platelets: 486 10*3/uL — ABNORMAL HIGH (ref 150–400)
RBC: 5.72 MIL/uL — ABNORMAL HIGH (ref 3.87–5.11)
RDW: 28.1 % — ABNORMAL HIGH (ref 11.5–15.5)
WBC: 18.6 10*3/uL — ABNORMAL HIGH (ref 4.0–10.5)
nRBC: 0 % (ref 0.0–0.2)
nRBC: 0 /100 WBC

## 2022-11-16 LAB — RAPID URINE DRUG SCREEN, HOSP PERFORMED
Amphetamines: NOT DETECTED
Barbiturates: NOT DETECTED
Benzodiazepines: POSITIVE — AB
Cocaine: NOT DETECTED
Opiates: NOT DETECTED
Tetrahydrocannabinol: NOT DETECTED

## 2022-11-16 LAB — LACTIC ACID, PLASMA: Lactic Acid, Venous: 1.3 mmol/L (ref 0.5–1.9)

## 2022-11-16 LAB — ETHANOL: Alcohol, Ethyl (B): <10 mg/dL (ref <10)

## 2022-11-16 LAB — CBG MONITORING, ED
Glucose-Capillary: 122 mg/dL — ABNORMAL HIGH (ref 70–99)
Glucose-Capillary: 141 mg/dL — ABNORMAL HIGH (ref 70–99)

## 2022-11-16 LAB — MRSA NEXT GEN BY PCR, NASAL: MRSA by PCR Next Gen: DETECTED — AB

## 2022-11-16 LAB — MAGNESIUM: Magnesium: 1.5 mg/dL — ABNORMAL LOW (ref 1.7–2.4)

## 2022-11-16 LAB — GLUCOSE, CAPILLARY: Glucose-Capillary: 108 mg/dL — ABNORMAL HIGH (ref 70–99)

## 2022-11-16 MED ORDER — LORAZEPAM 2 MG/ML IJ SOLN: 1.0000 mg | INTRAMUSCULAR | Status: DC | PRN

## 2022-11-16 MED ORDER — MAGNESIUM SULFATE 2 GM/50ML IV SOLN
2.0000 g | INTRAVENOUS | Status: AC
  Administered 2022-11-16: 2 g via INTRAVENOUS
  Filled 2022-11-16: qty 50

## 2022-11-16 MED ORDER — LEVETIRACETAM IN NACL 1000 MG/100ML IV SOLN
INTRAVENOUS | Status: AC
  Administered 2022-11-16: 1000 mg via INTRAVENOUS
  Filled 2022-11-16: qty 100

## 2022-11-16 MED ORDER — LEVETIRACETAM IN NACL 1000 MG/100ML IV SOLN: 1000.0000 mg | Freq: Once | INTRAVENOUS | Status: AC

## 2022-11-16 MED ORDER — GADOBUTROL 1 MMOL/ML IV SOLN
7.0000 mL | Freq: Once | INTRAVENOUS | Status: AC | PRN
  Administered 2022-11-16: 7 mL via INTRAVENOUS

## 2022-11-16 MED ORDER — LEVETIRACETAM IN NACL 500 MG/100ML IV SOLN
500.0000 mg | Freq: Two times a day (BID) | INTRAVENOUS | Status: DC
  Administered 2022-11-17: 500 mg via INTRAVENOUS
  Filled 2022-11-16: qty 100

## 2022-11-16 MED ORDER — DOCUSATE SODIUM 100 MG PO CAPS: 100.0000 mg | ORAL_CAPSULE | Freq: Two times a day (BID) | ORAL | Status: DC | PRN

## 2022-11-16 MED ORDER — POLYETHYLENE GLYCOL 3350 17 G PO PACK: 17.0000 g | PACK | Freq: Every day | ORAL | Status: DC | PRN

## 2022-11-16 MED ORDER — INSULIN ASPART 100 UNIT/ML IJ SOLN
0.0000 [IU] | INTRAMUSCULAR | Status: DC
  Administered 2022-11-17: 2 [IU] via SUBCUTANEOUS

## 2022-11-16 MED ORDER — SODIUM CHLORIDE 0.9 % IV BOLUS
1000.0000 mL | Freq: Once | INTRAVENOUS | Status: AC
  Administered 2022-11-16: 1000 mL via INTRAVENOUS

## 2022-11-16 MED ORDER — MAGNESIUM SULFATE 2 GM/50ML IV SOLN: 2.0000 g | Freq: Once | INTRAVENOUS | Status: DC

## 2022-11-16 MED ORDER — LORAZEPAM 2 MG/ML IJ SOLN
1.0000 mg | Freq: Once | INTRAMUSCULAR | Status: AC
  Administered 2022-11-16: 1 mg via INTRAVENOUS
  Filled 2022-11-16: qty 1

## 2022-11-16 NOTE — Progress Notes (Signed)
Stat EEG order has been canceled, LTM order placed. To be done after CTA

## 2022-11-16 NOTE — Progress Notes (Signed)
eLink Physician-Brief Progress Note [name] DOB: 04-05-1956 MRN: [id number]   Date of Service  11/16/2022  HPI/Events of Note  Bedside RN requesting central venous catheter for lack of IV access.  She has multiple sclerosis and is quadriplegic, contracted and bedbound.  Presented with new onset seizures.  Neurology ordered a CTA and an MRI.  Does need IV access to be done.  eICU Interventions  Patient's chart reviewed.  The benefits of placing a central line in this patient at night do not outweigh the risk.  Will defer till morning when patient can get either midline or PICC line for ongoing care.     Intervention Category Major Interventions: Change in mental status - evaluation and management  Carilyn Goodpasture 11/16/2022, 10:49 PM

## 2022-11-16 NOTE — Progress Notes (Signed)
MB was headed out to do STAT EEG and ordering NP informed me that patient was needing a STAT CTA after CT before EEG. Will wait until patient is available for EEG

## 2022-11-16 NOTE — ED Provider Notes (Signed)
[place] EMERGENCY DEPARTMENT AT [facility] Provider Note   CSN: [id number] Arrival date & time: 11/16/22  1258     History  Chief Complaint  Patient presents with   Seizures    Coming from home with status epilepticus. Pt is currently being ventilated with BVM. No hx of seizure    Jocelyn Sanchez is a 67 y.o. female with a past medical history of multiple sclerosis and Quadra paresis.  She is bedbound at baseline has a history of presacral ulceration and stage I decubitus ulcers of the bilateral ankles.  She was brought in by EMS after reported seizure which was lasting for approximately 30 minutes prior to intervention by EMS.  She has no known history of seizure.  EMS reports that the patient was rhythmically shaking in all extremities and that her eyes were rhythmically moving horizontally back-and-forth.  She was given 5 mg of IM Versed prior to arrival.  She remained tachycardic.  She had notable pinpoint pupils and was given a dose of Narcan with no improvement.  Patient required bag-valve-mask ventilation for respiratory support prior to arrival.  EMS reports she has no previous history of seizures.  Her sister and mother were caring for her at the time and her sister reports that she looked "unwell and seemed to be foaming at the mouth and not responding."  Review of EMR shows that patient was recently admitted to the hospital with acute kidney injury, hypomagnesemia and profound anemia. Review of EMR also shows that patient expressly wished to be DNR status and did not want ventilation or cardiopulmonary resuscitation.   Seizures      Home Medications Prior to Admission medications   Medication Sig Start Date End Date Taking? Authorizing Provider  acetaminophen (TYLENOL) 500 MG tablet Take 500-1,000 mg by mouth See admin instructions. Take 1,000 mg by mouth at bedtime and an additional 500-1,000 mg once a day as needed for pain or headaches    [provider]  Ascorbic Acid (VITAMIN C) 1000 MG tablet Take 1,000 mg by mouth daily. 08/02/20   [provider]  carvedilol (COREG) 6.25 MG tablet Take 6.25 mg by mouth in the morning. 12/21/18   [provider]  [employer] D3 50 MCG (2000 UT) CAPS Take 2,000 Units by mouth daily. 08/02/20   [provider]  ELIQUIS 5 MG TABS tablet Take 5 mg by mouth 2 (two) times daily. 03/11/19   [provider]  ENTRESTO 24-26 MG Take 1 tablet by mouth 2 (two) times daily. 03/13/19   [provider]  fluticasone (FLONASE) 50 MCG/ACT nasal spray Place 1 spray into both nostrils in the morning.    [provider]  ketoconazole (NIZORAL) 2 % cream Apply 1 Application topically 2 (two) times daily as needed for irritation (affected finger).    [provider]  levocetirizine (XYZAL) 5 MG tablet Take 5 mg by mouth every evening.    [provider]  liver oil-zinc oxide (DESITIN) 40 % ointment Apply 1 application topically as needed for irritation. Patient taking differently: Apply 1 application  topically as needed for irritation (affected areas). 08/12/20   Briant Cedar, MD  methocarbamol (ROBAXIN) 500 MG tablet Take 500 mg by mouth in the morning and at bedtime. 06/27/20   [provider]  Multiple Vitamin (MULTIVITAMIN WITH MINERALS) TABS tablet Take 1 tablet by mouth daily. 11/15/22   Briant Cedar, MD  Naphazoline-Glycerin-Zinc Sulf (CLEAR EYES MAXIMUM ITCHY EYE OP)  Place 1 drop into both eyes in the morning.    [provider]  nutrition supplement, JUVEN, (JUVEN) PACK Take 1 packet by mouth 2 (two) times daily between meals. 11/14/22 12/14/22  Briant Cedar, MD  omeprazole (PRILOSEC) 20 MG capsule Take 1 capsule (20 mg total) by mouth 2 (two) times daily before a meal. Patient taking differently: Take 20 mg by mouth daily before breakfast. 08/12/20 11/08/22  Briant Cedar, MD  polyethylene glycol (MIRALAX / GLYCOLAX) 17 g  packet Take 17 g by mouth daily. 11/15/22   Briant Cedar, MD  senna-docusate (SENOKOT-S) 8.6-50 MG tablet Take 1 tablet by mouth at bedtime. 11/14/22 12/14/22  Briant Cedar, MD  traZODone (DESYREL) 100 MG tablet Take 100 mg by mouth at bedtime. 06/04/20   [provider]  Zinc Sulfate 220 (50 Zn) MG TABS Take 220 mg by mouth daily. 08/02/20   [provider]      Allergies    Sulfa antibiotics and Chlorhexidine    Review of Systems   Review of Systems  Neurological:  Positive for seizures.    Physical Exam Updated Vital Signs BP 130/88 (BP Location: Right Arm)   Pulse (!) 130   Resp 11   SpO2 100%  Physical Exam Vitals and nursing note reviewed.  Constitutional:      Appearance: She is ill-appearing.     Interventions: Cervical collar in place.     Comments: Obtunded, on BVM, Nasal trumpet in place  HENT:     Head: Normocephalic.  Eyes:     Comments: Pin point pupils  Cardiovascular:     Rate and Rhythm: Tachycardia present.  Pulmonary:     Effort: Bradypnea present.     Breath sounds: Examination of the right-upper field reveals rhonchi. Examination of the left-upper field reveals rhonchi. Examination of the right-middle field reveals rhonchi. Examination of the left-middle field reveals rhonchi. Examination of the right-lower field reveals rhonchi. Examination of the left-lower field reveals rhonchi. Rhonchi present.  Abdominal:     General: There is distension.     Palpations: Abdomen is soft.  Musculoskeletal:     Comments: Contracted upper limbs  Feet:     Comments: BL stage 1 pressure ulcers of the heals, mepitel in place Skin:    Comments: Decubitus ulcer  Neurological:     Mental Status: She is unresponsive.     GCS: GCS eye subscore is 1. GCS verbal subscore is 1. GCS motor subscore is 1.     ED Results / Procedures / Treatments   Labs (all labs ordered are listed, but only abnormal results are displayed) Labs Reviewed - No  data to display  EKG EKG Interpretation  Date/Time:  Thursday Nov 16 2022 13:01:21 EDT Ventricular Rate:  129 PR Interval:  136 QRS Duration: 86 QT Interval:  319 QTC Calculation: 468 R Axis:   -35 Text Interpretation: Sinus tachycardia Non-specific ST-t changes Confirmed by Cathren Laine ([id number]) on 11/16/2022 1:06:32 PM  Radiology No results found.  Procedures .Critical Care  Performed by: Arthor Captain, PA-C Authorized by: Arthor Captain, PA-C   Critical care provider statement:    Critical care time (minutes):  80   Critical care time was exclusive of:  Separately billable procedures and treating other patients   Critical care was necessary to treat or prevent imminent or life-threatening deterioration of the following conditions:  CNS failure or compromise, respiratory failure and metabolic crisis   Critical care was time spent personally  by me on the following activities:  Development of treatment plan with patient or surrogate, discussions with consultants, evaluation of patient's response to treatment, examination of patient, ordering and review of laboratory studies, ordering and review of radiographic studies, ordering and performing treatments and interventions, pulse oximetry, re-evaluation of patient's condition, review of old charts, interpretation of cardiac output measurements and obtaining history from patient or surrogate     Medications Ordered in ED Medications  sodium chloride 0.9 % bolus 1,000 mL (has no administration in time range)    ED Course/ Medical Decision Making/ A&P Clinical Course as of 11/16/22 1640  Thu Nov 16, 2022  1306 Case discussed with patient's son- he is aware of her need for supported breathing. Will remain DNR/DNI [AH]  1335 pH, Arterial(!!): 7.174 Patient appears to have a significant acidosis likely metabolic.  Blood gases are within normal limits.  Suspect she will end up with a lactic acidosis given history of reported  prolonged seizure [AH]  1348 Glucose-Capillary(!): 122 [AH]  1348 pCO2 arterial: 42.5 [AH]  1348 pO2, Arterial: 97 [AH]  1407 Patient now more responsive to stimulus. She is breathing on her own. [AH]  1407 WBC(!): 18.6 [AH]  1407 Hemoglobin: 14.7 [AH]  1409 Potassium(!): 3.1 [AH]  1426 Patient appears to be having a seizure again with right sided grumous facial twitching, rhythmic eye movement.  Keppra loading dose ordered, Ativan ordered.  She is maintaining her airway presently. [AH]  1527 Magnesium(!): 1.5 [AH]    Clinical Course User Index [AH] Arthor Captain, PA-C                             Medical Decision Making Amount and/or Complexity of Data Reviewed Labs: ordered. Decision-making details documented in ED Course. Radiology: ordered.  Risk Prescription drug management. Decision regarding hospitalization.   This patient presents to the ED with chief complaint(s) of seizure with pertinent past medical history of MS. Antuciagulated, recent hx of hypomagnesemiawhich further complicates the presenting complaint. The complaint involves an extensive differential diagnosis and treatment options and also carries with it a high risk of complications and morbidity.    The differential diagnosis includes The differential diagnosis for includes but is not limited to idiopathic seizure, traumatic brain injury, intracranial hemorrhage, vascular lesion, mass or space containing lesion, degenerative neurologic disease, congenital brain abnormality, infectious etiology such as meningitis, encephalitis or abscess, metabolic disturbance including hyper or hypoglycemia, hyper or hyponatremia, hyperosmolar state, uremia, hepatic failure, hypocalcemia, hypomagnesemia.  Toxic substances such as cocaine, lidocaine, antidepressants, theophylline, alcohol withdrawal, drug withdrawal, eclampsia, hypertensive encephalopathy and anoxic brain injury.   Patient initially arrived with nasal trumpet and  bag-valve-mask supporting patient's breathing.  I contacted the patient's son via phone call who confirms patient's DNR/DNI status.  During my conversation with the patient's family patient began breathing on her own.  I placed multiple orders.  Patient's sister came to the ER and he was able to other more information.  Apparently patient started having grunting, shaking and foaming at the mouth lasting approximately 30 minutes before EMS arrived.  I ordered labs and imaging.  There are multiple critical findings on lab evaluation including low magnesium level at 1.5.  Patient ABG shows metabolic acidosis and I suspected initially probably a lactic acidosis as the underlying cause however patient's lactic acid is within normal limits and I am unsure of the cause of her on anion gap metabolic acidosis. Urine appears to be  infected however this may be a chronic thing for this patient. Urine culture sent. Patient's white cell count is elevated Magnesium level low.  Patient reevaluated mutiple times, she remains obunded - encephalopathy vs status epilepticus- she has continuallly tolerated a nasal trumpet   Patient's son Gerilyn Pilgrim at bedsiden- I hada long discussion with him about his mother's condition, her work up so far, and again discussed code status: she remains DO NOT RESUSCITATE.  Critical interventions include respiratory support with BVM, seizure precautions, Magnesium repletion IV, Keppra loading  Benzo administration.  Consultations: Case discussed with Dr Otelia Limes and APP Richardo Priest, will consult on paiteint I reviwed and interpreted CT head AND discussed findings via call with Dr. Celine Mans who recommends CTA- MRI w/wo - I updated Dr. Otelia Limes and Lucilla Edin. I discussed all findings with Dr. Waymon Amato who asks for critical care consult / admission. Case discussed with APP Nehemiah Settle and APP Tiburcio Pea of PCCM who will consult / admit patient to ICU.   Patient remains critically ill through out ED course  requirng frequent monitoring bedside intervention time and reevaluation.  SDOH - bedbound, supportive family          Final Clinical Impression(s) / ED Diagnoses Final diagnoses:  Seizure (HCC)  Hypomagnesemia  Metabolic acidosis  Cerebral edema (HCC)  Obtundation    Rx / DC Orders ED Discharge Orders     None         Arthor Captain, PA-C 11/17/22 1234    Cathren Laine, MD 11/17/22 [phone]

## 2022-11-16 NOTE — Consult Note (Signed)
Neurology Consultation Reason for Consult: seizures Referring Physician: Dr. Denton Lank  CC: witnessed seizures at home  History is obtained from: chart, sister at bedside  HPI: Jocelyn Sanchez is a 67 y.o. female with PMH significant for multiple sclerosis with quadriplegia/bedbound status, chronic sacral ulcer, hypertension, lymphedema, prior pulmonary embolism anticoagulated with Eliquis, and anemia who presented to the ED via EMS 5/30 with reports of seizure-like activity.  On ED arrival patient was tachycardic with pinpoint pupils. She did receive 5MG  IM versed per EMS, narcan given with no improvement seen. Nasal trumpet was placed. Focal seizures in ED, Ativan given. CT head with asymmetric edema in the left cerebral hemisphere; post-ictal versus early MCA infarct.MRI/MRA without contrast ordered as follow-up to CT due to patient's poor vascular access.  On exam, patient is unresponsive, pinpoint pupils, does not blink to threat, dysconjugate gaze with no tracking. Labored respirations with audible snoring, nasal trumpet remains in place.   Patient was recently discharged from [facility] 5/28 after being admitted for severe anemia, hypotension, increased somnolence. Concern for infection of chronic sacral wound. MRSA +. UC positive for E. Coli. Completed Doxy and Zosyn for 7 days. Patient on Eliquis due to history of PE.   ROS: Unable to obtain due to altered mental status.   Past Medical History:  Diagnosis Date   Anemia    DM II (diabetes mellitus, type II), controlled (HCC)    Gait disorder    HTN (hypertension)    Lymphedema    MS (multiple sclerosis) (HCC)    Pulmonary embolism (HCC)    Family History  Problem Relation Age of Onset   Multiple sclerosis Mother    Social History:  reports that she has quit smoking. Her smoking use included cigarettes. She has never used smokeless tobacco. She reports that she does not drink alcohol and does not use drugs.  Exam: Current vital  signs: BP 134/88   Pulse (!) 127   Temp 99 F (37.2 C) (Rectal)   Resp 14   SpO2 100%  Vital signs in last 24 hours: Temp:  [99 F (37.2 C)] 99 F (37.2 C) (05/30 1315) Pulse Rate:  [127-130] 127 (05/30 1445) Resp:  [11-14] 14 (05/30 1445) BP: (130-134)/(88) 134/88 (05/30 1445) SpO2:  [100 %] 100 % (05/30 1445)   Physical Exam  Appears well-developed and well-nourished.   Neuro: Mental Status: Unresponsive Cranial Nerves: II: Pinpoint pupils, No blink to threat bilaterally,   III,IV, VI: Unable to cooperate with testing of EOM.  V: Unable to assess VII: Grossly symmetric, otherwise unable to assess  VIII: Unable to assess X: Gag reflex deferred XI: Shoulder shrug is symmetric. XII: tongue is midline without atrophy or fasciculations.  Motor/Sensory: Increased tone and decreased bulk x 4. No withdrawals to noxious stimuli  Cerebellar: Unable to assess   I have reviewed labs in epic and the results pertinent to this consultation are: WBC 18.6 Plt 486 BUN 26  Mg 1.5 (replaced)  Albumin 2.4 AST 47 UA: large leukocytes and rare bacteria.   I have reviewed the images obtained: CT Head: Asymmetric edema in the left cerebral hemisphere, particularly in the opercular region, with asymmetric effacement of the basal cisterns on the left  Assessment: Jocelyn Sanchez is a 67 y.o. female with PMH significant for multiple sclerosis with quadriplegia/bedbound status, chronic sacral ulcer, hypertension, lymphedema, prior pulmonary embolism anticoagulated with Eliquis, and anemia who presented to the ED via EMS 5/30 with reports of seizure-like activity. - Exam reveals an  unresponsive patient with chronically increased tone and limb contractures in the context of her history of MS. No clinical seizure activity noted at the time of exam.  - CT Head: Asymmetric edema in the left cerebral hemisphere, particularly in the opercular region, with asymmetric effacement of the basal cisterns  on the left. DDx for the edema includes stroke and seizure-related cytotoxic edema  Recommendations: - Keppra load followed by 500 BID - LTM EEG - MRI brain - Hold Eliquis pending MRI. If no hemorrhage, restart via NGT - Electrolyte supplementation - Infectious workup  I have seen and examined the patient. I have discussed the assessment and recommendations with the Neurology NP and made amendations as necessary. 67 y.o. female with PMH significant for multiple sclerosis with quadriplegia/bedbound status, chronic sacral ulcer, hypertension, lymphedema, prior pulmonary embolism anticoagulated with Eliquis, and anemia who presented to the ED via EMS 5/30 with reports of seizure-like activity. Exam reveals chronically increased tone and limb contractures in the context of her history of MS. She has been started on Keppra. Recommendations include MRI brain and LTM EEG.  Electronically signed: Dr. Caryl Pina

## 2022-11-16 NOTE — H&P (Signed)
NAME:  Jocelyn Sanchez, MRN:  [id number], DOB:  [birth year], LOS: 0 ADMISSION DATE:  11/16/2022, CONSULTATION DATE:  11/16/2022 REFERRING MD:  Dr. Rush Landmark - EDP, CHIEF COMPLAINT: New onset seizures  History of Present Illness:  Jocelyn Sanchez is a 67 y.o. female with a PMH significant for multiple sclerosis with quadriplegia/bedbound status, chronic sacral ulcer, hypertension, lymphedema, prior pulmonary embolism anticoagulated with Eliquis, and anemia who presented to the ED via EMS 5/30 with reports of seizure-like activity.  Per family seizure activity lasted approximately 30 mins prior to EMS.   On ED arrival patient was seen tachycardic with pinpoint pupils after receiving 5MG  IM versed per EMS, narcan given with no improvement seen. Nasal trumpet was placed. Labor significant for Cl 115, BUN 26, Mg 1.5, Albumin 2.4, AST 47, WBC 18.6, Plt 486. UA with large leukocytes and rare bacteria. CT head with asymmetric edema in the left cerebral hemisphere Possibly secondary to postictal state, but early large left MCA territory infarct is also possible. MRI brain ordered.   Pertinent  Medical History  multiple sclerosis with quadriplegia/bedbound status, chronic sacral ulcer, hypertension, lymphedema, prior pulmonary embolism anticoagulated with Eliquis, and anemia   Significant Hospital Events: Including procedures, antibiotic start and stop dates in addition to other pertinent events   5/30 presented with new onset seizures head CT with asymmetric edema, MRI pending    Interim History / Subjective:  As above   Objective   Blood pressure 136/84, pulse (!) 133, temperature 99 F (37.2 C), temperature source Rectal, resp. rate 11, SpO2 100 %.        Intake/Output Summary (Last 24 hours) at 11/16/2022 1726 Last data filed at 11/16/2022 1408 Gross per 24 hour  Intake --  Output 250 ml  Net -250 ml   There were no vitals filed for this visit.  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  -Witness seizure activity per EMS and ED staff, patient has no returned back to baseline  Multiple sclerosis with quadriplegia/bedbound status -DNR/DNI code status on admission  Toxic metabolic encephalopathy  P: Primary management per neurology  Maintain neuro protective measures; goal for eurothermia, euglycemia, eunatermia, normoxia, and PCO2 goal of 35-40 Nutrition and bowel regiment  Seizure precautions  AEDs per neurology  Aspirations precautions  Optimize electrolytes  Follow up MRI  LTM per Neuro   Chronic sacral ulcer with hx of quadriplegia P: Local would care  Pressure alleviating devices  May need to consider MRI sacrum to rule out osteo in the setting of leukocytosis   Leukocytosis with hx of recent ESBL UTI  -Urine culture positive for ESBL 5/22, per chart treated with Zosyn  -UA on this admission with persistent large leukocytes but rare bacteria  P: Check procal  Send urine culture  Trend CBC and fever curve  Hold on ABT for now   HFrEF -ECHO 08/09/2017 with EF 40-45% with severe hypokinesis of the inferolateral myocardium and grade 1 diastolic dysfunction  Essential hypertension P: HX of combined systolic and diastolic congestive heart failure  On admission EKG: Chest x-ray: ECHO: Troponins: BNP: P: Continuous telemetry  Strict intake and output  Daily weight to assess volume status Daily assessment for need to diurese  Closely monitor renal function and  electrolytes  Repeat ECHO   Prior pulmonary embolism anticoagulated with Eliquis At risk for worsening hypoxic respiratory failure in the setting of possible status   P: Confirmed with family patient is a DNI/DNR on admission  Maintian nasal trumpet  Continue supplemental oxygen  Head of bed elevated 30 degrees Follow intermittent chest x-ray and ABG Ensure adequate pulmonary hygiene  Consider starting heparin drip if unable to take oral medications by tomorrow  Type 2 diabetes  P: SSiI  CBG goal 140-180  Hypomagnesemia  -1.5 on admit  P: Supplement  Trend     Best Practice (right click and "Reselect all SmartList Selections" daily)   Diet/type: NPO DVT prophylaxis: systemic heparin GI prophylaxis: PPI Lines: N/A Foley:  N/A Code Status:  DNR Last date of multidisciplinary goals of care discussion: Continue to update family daily   Labs   CBC: Recent Labs  Lab 11/10/22 0515 11/11/22 0529 11/12/22 0531 11/13/22 0442 11/16/22 1305 11/16/22 1332  WBC 9.5 10.3 11.2* 9.9 18.6*  --   NEUTROABS 7.1 7.5 7.7 5.6 14.3*  --   HGB 13.5 13.9 13.6 13.4 14.7 15.6*  HCT 43.2 45.7 44.4 41.2 48.6* 46.0  MCV 83.2 85.3 85.5 80.8 85.0  --   PLT 471* 382 411* 447* 486*  --     Basic Metabolic Panel: Recent Labs  Lab 11/10/22 0515 11/12/22 0531 11/13/22 0442 11/16/22 1305 11/16/22 1332  NA 134* 136 136 138 143  K 3.1* 3.7 4.7 3.6 3.1*  CL 116* 118* 117* 115*  --   CO2 8* 11* 12* 13*  --   GLUCOSE 77 100* 76 108*  --   BUN 34* 31* 29* 26*  --   CREATININE 0.81 0.70 0.69 0.87  --   CALCIUM 7.6* 8.0* 8.1* 8.3*  --   MG 1.9 1.8  --  1.5*  --    GFR: Estimated Creatinine Clearance: 57 mL/min (by C-G formula based on SCr of 0.87 mg/dL). Recent Labs  Lab 11/11/22 0529 11/12/22 0531 11/13/22 0442 11/16/22 1305  WBC 10.3 11.2* 9.9 18.6*  LATICACIDVEN  --   --   --  1.3    Liver Function Tests: Recent Labs  Lab 11/16/22 1305  AST 47*  ALT 17  ALKPHOS 100   BILITOT 0.4  PROT 6.9  ALBUMIN 2.4*   No results for input(s): "LIPASE", "AMYLASE" in the last 168 hours. No results for input(s): "AMMONIA" in the last 168 hours.  ABG    Component Value Date/Time   PHART 7.174 (LL) 11/16/2022 1332   PCO2ART 42.5 11/16/2022 1332   PO2ART 97 11/16/2022 1332   HCO3 15.6 (L) 11/16/2022 1332   TCO2 17 (L) 11/16/2022 1332   ACIDBASEDEF 12.0 (H) 11/16/2022 1332   O2SAT 95 11/16/2022 1332     Coagulation Profile: No results for input(s): "INR", "PROTIME" in the last 168 hours.  Cardiac Enzymes: No results for input(s): "CKTOTAL", "CKMB", "CKMBINDEX", "TROPONINI" in the last 168 hours.  HbA1C: Hgb A1c MFr Bld  Date/Time Value Ref Range Status  07/27/2019 07:05 AM 6.7 (H) 4.8 - 5.6 % Final    Comment:    (NOTE)         Prediabetes: 5.7 - 6.4         Diabetes: >6.4  Glycemic control for adults with diabetes: <7.0   02/11/2019 02:03 AM 5.9 (H) 4.8 - 5.6 % Final    Comment:    (NOTE) Pre diabetes:          5.7%-6.4% Diabetes:              >6.4% Glycemic control for   <7.0% adults with diabetes     CBG: Recent Labs  Lab 11/16/22 1327 11/16/22 1536  GLUCAP 122* 141*    Review of Systems:   Unable to assess   Past Medical History:  She,  has a past medical history of Anemia, DM II (diabetes mellitus, type II), controlled (HCC), Gait disorder, HTN (hypertension), Lymphedema, MS (multiple sclerosis) (HCC), and Pulmonary embolism (HCC).   Surgical History:   Past Surgical History:  Procedure Laterality Date   ENTEROSCOPY N/A 08/10/2020   Procedure: ENTEROSCOPY;  Surgeon: Kerin Salen, MD;  Location: [facility];  Service: Gastroenterology;  Laterality: N/A;   ESOPHAGOGASTRODUODENOSCOPY N/A 07/12/2016   Procedure: ESOPHAGOGASTRODUODENOSCOPY (EGD);  Surgeon: Hilarie Fredrickson, MD;  Location: [facility];  Service: Endoscopy;  Laterality: N/A;   ESOPHAGOGASTRODUODENOSCOPY N/A 02/11/2019   Procedure: ESOPHAGOGASTRODUODENOSCOPY (EGD);   Surgeon: Jeani Hawking, MD;  Location: [facility];  Service: Endoscopy;  Laterality: N/A;   ESOPHAGOGASTRODUODENOSCOPY (EGD) WITH PROPOFOL N/A 11/10/2022   Procedure: ESOPHAGOGASTRODUODENOSCOPY (EGD) WITH PROPOFOL;  Surgeon: Charlott Rakes, MD;  Location: [facility];  Service: Gastroenterology;  Laterality: N/A;   FLEXIBLE SIGMOIDOSCOPY Left 03/09/2015   Procedure: FLEXIBLE SIGMOIDOSCOPY;  Surgeon: Jeani Hawking, MD;  Location: [facility];  Service: Endoscopy;  Laterality: Left;   HOT HEMOSTASIS N/A 02/11/2019   Procedure: HOT HEMOSTASIS (ARGON PLASMA COAGULATION/BICAP);  Surgeon: Jeani Hawking, MD;  Location: [facility];  Service: Endoscopy;  Laterality: N/A;   HOT HEMOSTASIS N/A 08/10/2020   Procedure: HOT HEMOSTASIS (ARGON PLASMA COAGULATION/BICAP);  Surgeon: Kerin Salen, MD;  Location: [facility];  Service: Gastroenterology;  Laterality: N/A;     Social History:   reports that she has quit smoking. Her smoking use included cigarettes. She has never used smokeless tobacco. She reports that she does not drink alcohol and does not use drugs.   Family History:  Her family history includes Multiple sclerosis in her mother.   Allergies Allergies  Allergen Reactions   Sulfa Antibiotics Anaphylaxis, Shortness Of Breath and Swelling   Chlorhexidine Other (See Comments)    Reaction not recalled     Home Medications  Prior to Admission medications   Medication Sig Start Date End Date Taking? Authorizing Provider  acetaminophen (TYLENOL) 500 MG tablet Take 500-1,000 mg by mouth See admin instructions. Take 1,000 mg by mouth at bedtime and an additional 500-1,000 mg once a day as needed for pain or headaches    [provider]  Ascorbic Acid (VITAMIN C) 1000 MG tablet Take 1,000 mg by mouth daily. 08/02/20   [provider]  carvedilol (COREG) 6.25 MG tablet Take 6.25 mg by mouth in the morning. 12/21/18   [provider]  [employer] D3 50 MCG (2000 UT) CAPS Take 2,000  Units by mouth daily. 08/02/20   [provider]  ELIQUIS 5 MG TABS tablet Take 5 mg by mouth 2 (two) times daily. 03/11/19   [provider]  ENTRESTO 24-26 MG Take 1 tablet by mouth 2 (two) times daily. 03/13/19   [provider]  fluticasone (FLONASE) 50 MCG/ACT nasal spray Place 1 spray into both nostrils in the morning.    [provider]  ketoconazole (NIZORAL)  2 % cream Apply 1 Application topically 2 (two) times daily as needed for irritation (affected finger).    [provider]  levocetirizine (XYZAL) 5 MG tablet Take 5 mg by mouth every evening.    [provider]  liver oil-zinc oxide (DESITIN) 40 % ointment Apply 1 application topically as needed for irritation. Patient taking differently: Apply 1 application  topically as needed for irritation (affected areas). 08/12/20   Briant Cedar, MD  methocarbamol (ROBAXIN) 500 MG tablet Take 500 mg by mouth in the morning and at bedtime. 06/27/20   [provider]  Multiple Vitamin (MULTIVITAMIN WITH MINERALS) TABS tablet Take 1 tablet by mouth daily. 11/15/22   Briant Cedar, MD  Naphazoline-Glycerin-Zinc Sulf (CLEAR EYES MAXIMUM ITCHY EYE OP) Place 1 drop into both eyes in the morning.    [provider]  nutrition supplement, JUVEN, (JUVEN) PACK Take 1 packet by mouth 2 (two) times daily between meals. 11/14/22 12/14/22  Briant Cedar, MD  omeprazole (PRILOSEC) 20 MG capsule Take 1 capsule (20 mg total) by mouth 2 (two) times daily before a meal. Patient taking differently: Take 20 mg by mouth daily before breakfast. 08/12/20 11/08/22  Briant Cedar, MD  polyethylene glycol (MIRALAX / GLYCOLAX) 17 g packet Take 17 g by mouth daily. 11/15/22   Briant Cedar, MD  senna-docusate (SENOKOT-S) 8.6-50 MG tablet Take 1 tablet by mouth at bedtime. 11/14/22 12/14/22  Briant Cedar, MD  traZODone (DESYREL) 100 MG tablet Take 100 mg by mouth at bedtime.  06/04/20   [provider]  Zinc Sulfate 220 (50 Zn) MG TABS Take 220 mg by mouth daily. 08/02/20   [provider]     Critical care time:    Performed by: Kyani Simkin D. Harris  Total critical care time: 42 minutes  Critical care time was exclusive of separately billable procedures and treating other patients.  Critical care was necessary to treat or prevent imminent or life-threatening deterioration.  Critical care was time spent personally by me on the following activities: development of treatment plan with patient and/or surrogate as well as nursing, discussions with consultants, evaluation of patient's response to treatment, examination of patient, obtaining history from patient or surrogate, ordering and performing treatments and interventions, ordering and review of laboratory studies, ordering and review of radiographic studies, pulse oximetry and re-evaluation of patient's condition.  Lashawn Bromwell D. Harris, NP-C [facility] Personal contact information can be found on Amion  If no contact or response made please call [phone] 11/16/2022, 5:59 PM

## 2022-11-16 NOTE — ED Notes (Signed)
ED TO INPATIENT HANDOFF REPORT  ED Nurse Name and Phone #:  Marisue Ivan [phone]   S Name/Age/Gender Jocelyn Sanchez 67 y.o. female Room/Bed: 028C/028C  Code Status   Code Status: DNR  Home/SNF/Other Home Patient oriented to: self Is this baseline? No   Triage Complete: Triage complete  Chief Complaint Seizure [facility]) [R56.9]  Triage Note No notes on file   Allergies Allergies  Allergen Reactions   Sulfa Antibiotics Anaphylaxis, Shortness Of Breath and Swelling   Chlorhexidine Other (See Comments)    Reaction not recalled    Level of Care/Admitting Diagnosis ED Disposition     ED Disposition  Admit   Condition  --   Comment  Hospital Area: [facility] [[id number]]  Level of Care: ICU [6]  May admit patient to [facility] or [facility] if equivalent level of care is available:: Yes  Covid Evaluation: Asymptomatic - no recent exposure (last 10 days) testing not required  Diagnosis: Seizure [facility]) [[id number]]  Admitting Physician: Tomma Lightning [[id number]]  Attending Physician: Tomma Lightning [phone]  Certification:: I certify this patient will need inpatient services for at least 2 midnights  Estimated Length of Stay: 5          B Medical/Surgery History Past Medical History:  Diagnosis Date   Anemia    DM II (diabetes mellitus, type II), controlled (HCC)    Gait disorder    HTN (hypertension)    Lymphedema    MS (multiple sclerosis) (HCC)    Pulmonary embolism (HCC)    Past Surgical History:  Procedure Laterality Date   ENTEROSCOPY N/A 08/10/2020   Procedure: ENTEROSCOPY;  Surgeon: Kerin Salen, MD;  Location: [facility] ENDOSCOPY;  Service: Gastroenterology;  Laterality: N/A;   ESOPHAGOGASTRODUODENOSCOPY N/A 07/12/2016   Procedure: ESOPHAGOGASTRODUODENOSCOPY (EGD);  Surgeon: Hilarie Fredrickson, MD;  Location: [facility];  Service: Endoscopy;  Laterality: N/A;   ESOPHAGOGASTRODUODENOSCOPY N/A 02/11/2019   Procedure: ESOPHAGOGASTRODUODENOSCOPY (EGD);   Surgeon: Jeani Hawking, MD;  Location: [facility]OSCOPY;  Service: Endoscopy;  Laterality: N/A;   ESOPHAGOGASTRODUODENOSCOPY (EGD) WITH PROPOFOL N/A 11/10/2022   Procedure: ESOPHAGOGASTRODUODENOSCOPY (EGD) WITH PROPOFOL;  Surgeon: Charlott Rakes, MD;  Location: [facility] ENDOSCOPY;  Service: Gastroenterology;  Laterality: N/A;   FLEXIBLE SIGMOIDOSCOPY Left 03/09/2015   Procedure: FLEXIBLE SIGMOIDOSCOPY;  Surgeon: Jeani Hawking, MD;  Location: [facility] ENDOSCOPY;  Service: Endoscopy;  Laterality: Left;   HOT HEMOSTASIS N/A 02/11/2019   Procedure: HOT HEMOSTASIS (ARGON PLASMA COAGULATION/BICAP);  Surgeon: Jeani Hawking, MD;  Location: [facility];  Service: Endoscopy;  Laterality: N/A;   HOT HEMOSTASIS N/A 08/10/2020   Procedure: HOT HEMOSTASIS (ARGON PLASMA COAGULATION/BICAP);  Surgeon: Kerin Salen, MD;  Location: [facility]OSCOPY;  Service: Gastroenterology;  Laterality: N/A;     A IV Location/Drains/Wounds Patient Lines/Drains/Airways Status     Active Line/Drains/Airways     Name Placement date Placement time Site Days   Peripheral IV 11/16/22 20 G Left;Anterior Hand 11/16/22  --  Hand  less than 1   Urethral Catheter Sheryn Bison RN Straight-tip 14 Fr. 11/16/22  1400  Straight-tip  less than 1   Pressure Injury 08/06/20 Sacrum Mid Stage III -  Full thickness tissue loss. Subcutaneous fat may be visible but bone, tendon or muscle are NOT exposed. healing , not coplately healed 08/06/20  2304  -- 832   Pressure Injury 08/06/20 Heel Left Stage 2 -  Partial thickness loss of dermis presenting as a shallow open injury with a red, pink wound bed without slough. 08/06/20  2306  --  832   Pressure Injury 11/08/22 Coccyx Medial Unstageable - Full thickness tissue loss in which the base of the injury is covered by slough (yellow, tan, gray, green or brown) and/or eschar (tan, brown or black) in the wound bed. Tunneling 11/08/22  2000  -- 8   Pressure Injury 11/09/22 Heel Left Deep Tissue Pressure Injury - Purple  or maroon localized area of discolored intact skin or blood-filled blister due to damage of underlying soft tissue from pressure and/or shear. 2 cm x 3 cm 11/09/22  0700  -- 7   Pressure Injury 11/09/22 Buttocks Right Stage 3 -  Full thickness tissue loss. Subcutaneous fat may be visible but bone, tendon or muscle are NOT exposed. 2 cm x 3 cm x 1 cm w/1.4 cm undermining from 12-3 o'clock 11/09/22  0700  -- 7   Wound / Incision (Open or Dehisced) 11/08/22 Irritant Dermatitis (Moisture Associated Skin Damage) Buttocks Left;Right RED 11/08/22  2000  Buttocks  8            Intake/Output Last 24 hours  Intake/Output Summary (Last 24 hours) at 11/16/2022 1853 Last data filed at 11/16/2022 1408 Gross per 24 hour  Intake --  Output 250 ml  Net -250 ml    Labs/Imaging Results for orders placed or performed during the hospital encounter of 11/16/22 (from the past 48 hour(s))  Comprehensive metabolic panel     Status: Abnormal   Collection Time: 11/16/22  1:05 PM  Result Value Ref Range   Sodium 138 135 - 145 mmol/L   Potassium 3.6 3.5 - 5.1 mmol/L   Chloride 115 (H) 98 - 111 mmol/L   CO2 13 (L) 22 - 32 mmol/L   Glucose, Bld 108 (H) 70 - 99 mg/dL    Comment: Glucose reference range applies only to samples taken after fasting for at least 8 hours.   BUN 26 (H) 8 - 23 mg/dL   Creatinine, Ser 1.61 0.44 - 1.00 mg/dL   Calcium 8.3 (L) 8.9 - 10.3 mg/dL   Total Protein 6.9 6.5 - 8.1 g/dL   Albumin 2.4 (L) 3.5 - 5.0 g/dL   AST 47 (H) 15 - 41 U/L   ALT 17 0 - 44 U/L   Alkaline Phosphatase 100 38 - 126 U/L   Total Bilirubin 0.4 0.3 - 1.2 mg/dL   GFR, Estimated >09 >60 mL/min    Comment: (NOTE) Calculated using the CKD-EPI Creatinine Equation (2021)    Anion gap 10 5 - 15    Comment: Performed at [facility], [address] [place], [place] [address]  CBC with Differential/Platelet     Status: Abnormal   Collection Time: 11/16/22  1:05 PM  Result Value Ref Range   WBC 18.6 (H) 4.0 -  10.5 K/uL   RBC 5.72 (H) 3.87 - 5.11 MIL/uL   Hemoglobin 14.7 12.0 - 15.0 g/dL   HCT 81.1 (H) 91.4 - 78.2 %   MCV 85.0 80.0 - 100.0 fL   MCH 25.7 (L) 26.0 - 34.0 pg   MCHC 30.2 30.0 - 36.0 g/dL   RDW 95.6 (H) 21.3 - 08.6 %   Platelets 486 (H) 150 - 400 K/uL   nRBC 0.0 0.0 - 0.2 %   Neutrophils Relative % 77 %   Neutro Abs 14.3 (H) 1.7 - 7.7 K/uL   Lymphocytes Relative 14 %   Lymphs Abs 2.6 0.7 - 4.0 K/uL   Monocytes Relative 5 %   Monocytes Absolute 0.9 0.1 -  1.0 K/uL   Eosinophils Relative 1 %   Eosinophils Absolute 0.2 0.0 - 0.5 K/uL   Basophils Relative 3 %   Basophils Absolute 0.6 (H) 0.0 - 0.1 K/uL   nRBC 0 0 /100 WBC   Abs Immature Granulocytes 0.00 0.00 - 0.07 K/uL   Acanthocytes PRESENT    Polychromasia PRESENT    Ovalocytes PRESENT     Comment: Performed at [facility], [address] [place], [place] [address]  Magnesium     Status: Abnormal   Collection Time: 11/16/22  1:05 PM  Result Value Ref Range   Magnesium 1.5 (L) 1.7 - 2.4 mg/dL    Comment: Performed at [facility], [address], [place], [place] [address]  Lactic acid, plasma     Status: None   Collection Time: 11/16/22  1:05 PM  Result Value Ref Range   Lactic Acid, Venous 1.3 0.5 - 1.9 mmol/L    Comment: Performed at [facility], [address] [place], [place] [address]  Ethanol     Status: None   Collection Time: 11/16/22  1:05 PM  Result Value Ref Range   Alcohol, Ethyl (B) <10 <10 mg/dL    Comment: (NOTE) Lowest detectable limit for serum alcohol is 10 mg/dL.  For medical purposes only. Performed at [facility], [address] [place], [place] [address]   Rapid urine drug screen (hospital performed)     Status: Abnormal   Collection Time: 11/16/22  1:12 PM  Result Value Ref Range   Opiates NONE DETECTED NONE DETECTED   Cocaine NONE DETECTED NONE DETECTED   Benzodiazepines POSITIVE (A) NONE DETECTED   Amphetamines NONE DETECTED NONE DETECTED    Tetrahydrocannabinol NONE DETECTED NONE DETECTED   Barbiturates NONE DETECTED NONE DETECTED    Comment: (NOTE) DRUG SCREEN FOR MEDICAL PURPOSES ONLY.  IF CONFIRMATION IS NEEDED FOR ANY PURPOSE, NOTIFY LAB WITHIN 5 DAYS.  LOWEST DETECTABLE LIMITS FOR URINE DRUG SCREEN Drug Class                     Cutoff (ng/mL) Amphetamine and metabolites    1000 Barbiturate and metabolites    200 Benzodiazepine                 200 Opiates and metabolites        300 Cocaine and metabolites        300 THC                            50 Performed at [facility], [address], [place], [place] [address]   CBG monitoring, ED     Status: Abnormal   Collection Time: 11/16/22  1:27 PM  Result Value Ref Range   Glucose-Capillary 122 (H) 70 - 99 mg/dL    Comment: Glucose reference range applies only to samples taken after fasting for at least 8 hours.  I-Stat arterial blood gas, ED     Status: Abnormal   Collection Time: 11/16/22  1:32 PM  Result Value Ref Range   pH, Arterial 7.174 (LL) 7.35 - 7.45   pCO2 arterial 42.5 32 - 48 mmHg   pO2, Arterial 97 83 - 108 mmHg   Bicarbonate 15.6 (L) 20.0 - 28.0 mmol/L   TCO2 17 (L) 22 - 32 mmol/L   O2 Saturation 95 %   Acid-base deficit 12.0 (H) 0.0 - 2.0 mmol/L  Sodium 143 135 - 145 mmol/L   Potassium 3.1 (L) 3.5 - 5.1 mmol/L   Calcium, Ion 1.35 1.15 - 1.40 mmol/L   HCT 46.0 36.0 - 46.0 %   Hemoglobin 15.6 (H) 12.0 - 15.0 g/dL   Patient temperature 60.4 F    Collection site RADIAL, ALLEN'S TEST ACCEPTABLE    Drawn by RT    Sample type ARTERIAL    Comment NOTIFIED PHYSICIAN   Urinalysis, Routine w reflex microscopic -Urine, Catheterized     Status: Abnormal   Collection Time: 11/16/22  1:57 PM  Result Value Ref Range   Color, Urine YELLOW YELLOW   APPearance CLOUDY (A) CLEAR   Specific Gravity, Urine 1.011 1.005 - 1.030   pH 6.0 5.0 - 8.0   Glucose, UA NEGATIVE NEGATIVE mg/dL   Hgb urine dipstick LARGE (A) NEGATIVE   Bilirubin Urine  NEGATIVE NEGATIVE   Ketones, ur NEGATIVE NEGATIVE mg/dL   Protein, ur 540 (A) NEGATIVE mg/dL   Nitrite NEGATIVE NEGATIVE   Leukocytes,Ua LARGE (A) NEGATIVE   RBC / HPF >50 0 - 5 RBC/hpf   WBC, UA >50 0 - 5 WBC/hpf   Bacteria, UA RARE (A) NONE SEEN   Squamous Epithelial / HPF 0-5 0 - 5 /HPF    Comment: Performed at [facility], [address], [place], [place] [address]  CBG monitoring, ED     Status: Abnormal   Collection Time: 11/16/22  3:36 PM  Result Value Ref Range   Glucose-Capillary 141 (H) 70 - 99 mg/dL    Comment: Glucose reference range applies only to samples taken after fasting for at least 8 hours.   MR BRAIN W WO CONTRAST  Result Date: 11/16/2022 CLINICAL DATA:  Mental status change, unknown cause; Neuro deficit, acute, stroke suspected EXAM: MRI HEAD WITHOUT AND WITH CONTRAST MRA HEAD WITHOUT CONTRAST MRA NECK WITHOUT AND WITH CONTRAST TECHNIQUE: Multiplanar, multi-echo pulse sequences of the brain and surrounding structures were acquired without and with intravenous contrast. Angiographic images of the Circle of Willis were acquired using MRA technique without intravenous contrast.Angiographic images of the neck were acquired using MRA technique without and with intravenous contrast. Carotid stenosis measurements (when applicable) are obtained utilizing NASCET criteria, using the distal internal carotid diameter as the denominator. CONTRAST:  7mL GADAVIST GADOBUTROL 1 MMOL/ML IV SOLN COMPARISON:  None Available. FINDINGS: MRI HEAD FINDINGS Brain: Regions of diffusion restriction in the left thalamus in the anterior left temporal lobe are favored to represent postictal changes. There is no evidence of acute infarct. No contrast-enhancing lesion to suggest intracranial metastatic disease. There are multiple periventricular, pericallosal, and subcortical T2/FLAIR hyperintense lesions that are favored to represent demyelinating lesions. None of the lesions definitively demonstrate  diffusion restriction or contrast enhancement to suggest active demyelination. No hemorrhage.  No extra-axial fluid collection.  No hydrocephalus. Vascular: Normal flow voids. Skull and upper cervical spine: Normal marrow signal. Sinuses/Orbits: No middle ear or mastoid effusion. Paranasal sinuses are notable for mild mucosal thickening in bilateral ethmoid sinuses. Orbits are unremarkable. Other: None. MRA HEAD FINDINGS Anterior circulation: There is moderate severe narrowing in the cavernous segments bilateral ICAs. Bilateral supraclinoid ICAs and MCAs are normal in appearance. Posterior circulation: No aneurysm. No stenosis. No proximal occlusion. Anatomic variants: None MRA NECK FINDINGS Aortic arch: Standard three-vessel arch. No evidence of dissection. No aneurysm. Right carotid system: No significant stenosis. No occlusion. No dissection. Left carotid system: The origin of the left common carotid artery is incompletely assessed due to  motion artifact. Moderate narrowing of the origin of the left ICA. No evidence of occlusion. No dissection. No stenosis. Vertebral arteries: The origin of the right vertebral artery is incompletely assessed due to motion artifact. On the MIP images, there is absent signal of the origin of the right vertebral artery. The right vertebral artery also demonstrates narrowing in the V4 segment. Other: None. Vascular: As above. Skull and upper cervical spine: Normal marrow signal. IMPRESSION: 1. Regions of diffusion restriction in the left thalamus and anterior left temporal lobe are favored to represent postictal changes. 2. Multiple periventricular, pericallosal, and subcortical T2/FLAIR hyperintense lesions that are favored to represent demyelinating lesions. These lesions do not definitively demonstrate diffusion restriction or contrast enhancement to suggest active demyelination. 3. Moderate to severe narrowing in the cavernous segments of bilateral ICAs. 4. There is absent signal  at the origin of the right vertebral artery, which may be due to motion artifact or severe stenosis. Recommend further evaluation with a CTA head/neck if more definitive characterization is clinically warranted. 5. Moderate narrowing of the origin of the left ICA. Electronically Signed   By: Lorenza Cambridge M.D.   On: 11/16/2022 18:46   MR ANGIO HEAD WO CONTRAST  Result Date: 11/16/2022 CLINICAL DATA:  Mental status change, unknown cause; Neuro deficit, acute, stroke suspected EXAM: MRI HEAD WITHOUT AND WITH CONTRAST MRA HEAD WITHOUT CONTRAST MRA NECK WITHOUT AND WITH CONTRAST TECHNIQUE: Multiplanar, multi-echo pulse sequences of the brain and surrounding structures were acquired without and with intravenous contrast. Angiographic images of the Circle of Willis were acquired using MRA technique without intravenous contrast.Angiographic images of the neck were acquired using MRA technique without and with intravenous contrast. Carotid stenosis measurements (when applicable) are obtained utilizing NASCET criteria, using the distal internal carotid diameter as the denominator. CONTRAST:  7mL GADAVIST GADOBUTROL 1 MMOL/ML IV SOLN COMPARISON:  None Available. FINDINGS: MRI HEAD FINDINGS Brain: Regions of diffusion restriction in the left thalamus in the anterior left temporal lobe are favored to represent postictal changes. There is no evidence of acute infarct. No contrast-enhancing lesion to suggest intracranial metastatic disease. There are multiple periventricular, pericallosal, and subcortical T2/FLAIR hyperintense lesions that are favored to represent demyelinating lesions. None of the lesions definitively demonstrate diffusion restriction or contrast enhancement to suggest active demyelination. No hemorrhage.  No extra-axial fluid collection.  No hydrocephalus. Vascular: Normal flow voids. Skull and upper cervical spine: Normal marrow signal. Sinuses/Orbits: No middle ear or mastoid effusion. Paranasal sinuses  are notable for mild mucosal thickening in bilateral ethmoid sinuses. Orbits are unremarkable. Other: None. MRA HEAD FINDINGS Anterior circulation: There is moderate severe narrowing in the cavernous segments bilateral ICAs. Bilateral supraclinoid ICAs and MCAs are normal in appearance. Posterior circulation: No aneurysm. No stenosis. No proximal occlusion. Anatomic variants: None MRA NECK FINDINGS Aortic arch: Standard three-vessel arch. No evidence of dissection. No aneurysm. Right carotid system: No significant stenosis. No occlusion. No dissection. Left carotid system: The origin of the left common carotid artery is incompletely assessed due to motion artifact. Moderate narrowing of the origin of the left ICA. No evidence of occlusion. No dissection. No stenosis. Vertebral arteries: The origin of the right vertebral artery is incompletely assessed due to motion artifact. On the MIP images, there is absent signal of the origin of the right vertebral artery. The right vertebral artery also demonstrates narrowing in the V4 segment. Other: None. Vascular: As above. Skull and upper cervical spine: Normal marrow signal. IMPRESSION: 1. Regions of diffusion restriction in the  left thalamus and anterior left temporal lobe are favored to represent postictal changes. 2. Multiple periventricular, pericallosal, and subcortical T2/FLAIR hyperintense lesions that are favored to represent demyelinating lesions. These lesions do not definitively demonstrate diffusion restriction or contrast enhancement to suggest active demyelination. 3. Moderate to severe narrowing in the cavernous segments of bilateral ICAs. 4. There is absent signal at the origin of the right vertebral artery, which may be due to motion artifact or severe stenosis. Recommend further evaluation with a CTA head/neck if more definitive characterization is clinically warranted. 5. Moderate narrowing of the origin of the left ICA. Electronically Signed   By:  Lorenza Cambridge M.D.   On: 11/16/2022 18:46   MR ANGIO NECK W WO CONTRAST  Result Date: 11/16/2022 CLINICAL DATA:  Mental status change, unknown cause; Neuro deficit, acute, stroke suspected EXAM: MRI HEAD WITHOUT AND WITH CONTRAST MRA HEAD WITHOUT CONTRAST MRA NECK WITHOUT AND WITH CONTRAST TECHNIQUE: Multiplanar, multi-echo pulse sequences of the brain and surrounding structures were acquired without and with intravenous contrast. Angiographic images of the Circle of Willis were acquired using MRA technique without intravenous contrast.Angiographic images of the neck were acquired using MRA technique without and with intravenous contrast. Carotid stenosis measurements (when applicable) are obtained utilizing NASCET criteria, using the distal internal carotid diameter as the denominator. CONTRAST:  7mL GADAVIST GADOBUTROL 1 MMOL/ML IV SOLN COMPARISON:  None Available. FINDINGS: MRI HEAD FINDINGS Brain: Regions of diffusion restriction in the left thalamus in the anterior left temporal lobe are favored to represent postictal changes. There is no evidence of acute infarct. No contrast-enhancing lesion to suggest intracranial metastatic disease. There are multiple periventricular, pericallosal, and subcortical T2/FLAIR hyperintense lesions that are favored to represent demyelinating lesions. None of the lesions definitively demonstrate diffusion restriction or contrast enhancement to suggest active demyelination. No hemorrhage.  No extra-axial fluid collection.  No hydrocephalus. Vascular: Normal flow voids. Skull and upper cervical spine: Normal marrow signal. Sinuses/Orbits: No middle ear or mastoid effusion. Paranasal sinuses are notable for mild mucosal thickening in bilateral ethmoid sinuses. Orbits are unremarkable. Other: None. MRA HEAD FINDINGS Anterior circulation: There is moderate severe narrowing in the cavernous segments bilateral ICAs. Bilateral supraclinoid ICAs and MCAs are normal in appearance.  Posterior circulation: No aneurysm. No stenosis. No proximal occlusion. Anatomic variants: None MRA NECK FINDINGS Aortic arch: Standard three-vessel arch. No evidence of dissection. No aneurysm. Right carotid system: No significant stenosis. No occlusion. No dissection. Left carotid system: The origin of the left common carotid artery is incompletely assessed due to motion artifact. Moderate narrowing of the origin of the left ICA. No evidence of occlusion. No dissection. No stenosis. Vertebral arteries: The origin of the right vertebral artery is incompletely assessed due to motion artifact. On the MIP images, there is absent signal of the origin of the right vertebral artery. The right vertebral artery also demonstrates narrowing in the V4 segment. Other: None. Vascular: As above. Skull and upper cervical spine: Normal marrow signal. IMPRESSION: 1. Regions of diffusion restriction in the left thalamus and anterior left temporal lobe are favored to represent postictal changes. 2. Multiple periventricular, pericallosal, and subcortical T2/FLAIR hyperintense lesions that are favored to represent demyelinating lesions. These lesions do not definitively demonstrate diffusion restriction or contrast enhancement to suggest active demyelination. 3. Moderate to severe narrowing in the cavernous segments of bilateral ICAs. 4. There is absent signal at the origin of the right vertebral artery, which may be due to motion artifact or severe stenosis. Recommend further evaluation  with a CTA head/neck if more definitive characterization is clinically warranted. 5. Moderate narrowing of the origin of the left ICA. Electronically Signed   By: Lorenza Cambridge M.D.   On: 11/16/2022 18:46   DG Chest Portable 1 View  Result Date: 11/16/2022 CLINICAL DATA:  Shortness of breath EXAM: PORTABLE CHEST 1 VIEW COMPARISON:  11/08/2022 FINDINGS: Cardiac shadow is stable. Aortic calcifications are again seen. The lungs are well aerated. New  increased left basilar airspace opacity is noted likely representing focal atelectasis. No bony abnormality is noted. IMPRESSION: New left basilar atelectasis. Electronically Signed   By: Alcide Clever M.D.   On: 11/16/2022 16:00   CT Head Wo Contrast  Result Date: 11/16/2022 CLINICAL DATA:  Seizure, new-onset, no history of trauma EXAM: CT HEAD WITHOUT CONTRAST TECHNIQUE: Contiguous axial images were obtained from the base of the skull through the vertex without intravenous contrast. RADIATION DOSE REDUCTION: This exam was performed according to the departmental dose-optimization program which includes automated exposure control, adjustment of the mA and/or kV according to patient size and/or use of iterative reconstruction technique. COMPARISON:  CT Head 11/28/14 FINDINGS: Brain: There is asymmetric effacement of the cerebral sulci in the left cerebral hemisphere, particularly in the opercular region (series 3, image 20). No hemorrhage. Gray-white differentiation generally seems preserved. No hydrocephalus. No extra-axial fluid collection. There is asymmetric effacement of the basal cisterns on the left (series 3, image 23), which can be seen in the setting of early uncal herniation. Vascular: No hyperdense vessel or unexpected calcification. Skull: Normal. Negative for fracture or focal lesion. Sinuses/Orbits: No middle ear or mastoid effusion. There is complete opacification of the right external auditory canal, likely due to impacted cerumen. Orbits are unremarkable. Other: Endotracheal tube in place. IMPRESSION: Asymmetric edema in the left cerebral hemisphere, particularly in the opercular region, with asymmetric effacement of the basal cisterns on the left. Given history of seizures, findings could be postictal, but early large left MCA territory infarct is also a differential consideration. Recommend further evaluation with CTA of the head and neck, and subsequently a brain MRI. Electronically Signed    By: Lorenza Cambridge M.D.   On: 11/16/2022 15:21    Pending Labs Unresulted Labs (From admission, onward)     Start     Ordered   11/17/22 0500  CBC  Tomorrow morning,   R        11/16/22 1839   11/17/22 0500  Basic metabolic panel  Tomorrow morning,   R        11/16/22 1839   11/17/22 0500  Magnesium  Tomorrow morning,   R        11/16/22 1839   11/17/22 0500  Phosphorus  Tomorrow morning,   R        11/16/22 1839   11/16/22 1850  Urinalysis, w/ Reflex to Culture (Infection Suspected) -Urine, Catheterized  (Urine Labs)  Once,   R       Question:  Specimen Source  Answer:  Urine, Catheterized   11/16/22 1849   11/16/22 1849  Hemoglobin A1c  Once,   R       Comments: To assess prior glycemic control    11/16/22 1849   11/16/22 1528  CK  Once,   URGENT        11/16/22 1528   11/16/22 1528  Procalcitonin  Once,   URGENT       References:    Procalcitonin Lower Respiratory Tract Infection AND Sepsis Procalcitonin Algorithm  11/16/22 1528   11/16/22 1527  Calcium, ionized  Once,   STAT        11/16/22 1528   11/16/22 1526  RPR  (Dementia Panel (PNL))  Once,   URGENT        11/16/22 1528   11/16/22 1526  Vitamin B12  (Dementia Panel (PNL))  Once,   URGENT        11/16/22 1528   11/16/22 1526  Sedimentation rate  (Dementia Panel (PNL))  Once,   URGENT        11/16/22 1528   11/16/22 1526  TSH  (Dementia Panel (PNL))  Once,   URGENT        11/16/22 1528   11/16/22 1526  Folate  (Dementia Panel (PNL))  Once,   URGENT        11/16/22 1528   11/16/22 1312  Lactic acid, plasma  Now then every 2 hours,   R (with STAT occurrences)      11/16/22 1312            Vitals/Pain Today's Vitals   11/16/22 1315 11/16/22 1445 11/16/22 1515 11/16/22 1819  BP:  134/88 136/84 125/86  Pulse:  (!) 127 (!) 133 (!) 114  Resp:  14 11 14   Temp: 99 F (37.2 C)   (!) 97.5 F (36.4 C)  TempSrc: Rectal   Axillary  SpO2:  100% 100% 100%    Isolation Precautions No active  isolations  Medications Medications  levETIRAcetam (KEPPRA) IVPB 500 mg/100 mL premix (has no administration in time range)  LORazepam (ATIVAN) injection 1 mg (has no administration in time range)  docusate sodium (COLACE) capsule 100 mg (has no administration in time range)  polyethylene glycol (MIRALAX / GLYCOLAX) packet 17 g (has no administration in time range)  insulin aspart (novoLOG) injection 0-15 Units (has no administration in time range)  magnesium sulfate IVPB 2 g 50 mL (has no administration in time range)  sodium chloride 0.9 % bolus 1,000 mL (0 mLs Intravenous Stopped 11/16/22 1518)  levETIRAcetam (KEPPRA) IVPB 1000 mg/100 mL premix (0 mg Intravenous Stopped 11/16/22 1515)  LORazepam (ATIVAN) injection 1 mg (1 mg Intravenous Given 11/16/22 1516)  magnesium sulfate IVPB 2 g 50 mL (0 g Intravenous Stopped 11/16/22 1820)  gadobutrol (GADAVIST) 1 MMOL/ML injection 7 mL (7 mLs Intravenous Contrast Given 11/16/22 1807)    Mobility non-ambulatory     Focused Assessments Cardiac Assessment Handoff:  Cardiac Rhythm: Sinus tachycardia Lab Results  Component Value Date   TROPONINI 0.09 (H) 10/27/2015   Lab Results  Component Value Date   DDIMER 1.93 (H) 08/06/2020   Does the Patient currently have chest pain? No   , Neuro Assessment Handoff:  Swallow screen pass? No  Cardiac Rhythm: Sinus tachycardia   Last date known well: 11/16/22 Last time known well: 1100 Neuro Assessment: Exceptions to WDL Neuro Checks:      Has TPA been given? No If patient is a Neuro Trauma and patient is going to OR before floor call report to 4N Charge nurse: [phone] or [phone]  , Pulmonary Assessment Handoff:  Lung sounds: L Breath Sounds: Coarse crackles R Breath Sounds: Coarse crackles O2 Device: Nasal Cannula O2 Flow Rate (L/min): 1 L/min    R Recommendations: See Admitting Provider Note  Report given to:   Additional Notes:  Has 2 IV's in each hand. They do not  pull back. Had lab come attempt to pull labs but unable to. I have place an order  for IV team for further access for labs as well as CTA

## 2022-11-16 NOTE — ED Notes (Signed)
Order for IV team placed. Pt has 2 IV's that do not pull back, and lab attempted to straight stick without success.

## 2022-11-16 NOTE — Progress Notes (Signed)
Elink notified that IV team unable to place another PIV at this time and that patient would need CVC access for IV needs due to being unable to place a PICC line overnight. Phlebotomy team unable to obtain labs with multiple attempts from ED and while in ICU. One PIV access in left hand to infuse magnesium replacement flushes well. CTA also unable to be completed due to need of IV access in the forearm or above. E-link notified of all of above and awaiting MD call back.   2245: Dr. Cloyd Sanchez returns page to this RN and reviews chart. Ground team and Dr. Cloyd Sanchez determine that central access is not needed at this time and Midline or PICC line is better suited for this patient. Notified that labs are unable to be drawn also and Dr. Cloyd Sanchez states that is not an indication for a central line. Dr. Cloyd Sanchez requests that RN call neuro team to determine if CTA needs to be done tonight.   2300: Dr. Wilford Sanchez with neuro team paged and returns call and states that CTA can wait until the AM and when IV access is established. Okay to proceed with cEEG at this time. EEG tech notified of permission to proceed with EEG despite not being able to obtain CT.

## 2022-11-16 NOTE — ED Notes (Signed)
Pt having focal facial seizures. Meds given

## 2022-11-17 ENCOUNTER — Inpatient Hospital Stay (HOSPITAL_COMMUNITY): Payer: Medicare PPO

## 2022-11-17 ENCOUNTER — Other Ambulatory Visit (HOSPITAL_COMMUNITY): Payer: Medicare PPO

## 2022-11-17 DIAGNOSIS — R569 Unspecified convulsions: Secondary | ICD-10-CM | POA: Diagnosis not present

## 2022-11-17 LAB — URINALYSIS, W/ REFLEX TO CULTURE (INFECTION SUSPECTED)
Bacteria, UA: NONE SEEN
Bilirubin Urine: NEGATIVE
Glucose, UA: 50 mg/dL — AB
Ketones, ur: NEGATIVE mg/dL
Nitrite: NEGATIVE
Protein, ur: 100 mg/dL — AB
RBC / HPF: >50 RBC/hpf (ref 0–5)
Specific Gravity, Urine: 1.013 (ref 1.005–1.030)
WBC, UA: >50 WBC/hpf (ref 0–5)
pH: 6 (ref 5.0–8.0)

## 2022-11-17 LAB — GLUCOSE, CAPILLARY
Glucose-Capillary: 108 mg/dL — ABNORMAL HIGH (ref 70–99)
Glucose-Capillary: 138 mg/dL — ABNORMAL HIGH (ref 70–99)
Glucose-Capillary: 139 mg/dL — ABNORMAL HIGH (ref 70–99)

## 2022-11-17 LAB — URINE CULTURE

## 2022-11-17 MED ORDER — SODIUM CHLORIDE 0.9 % IV SOLN
400.0000 mg | INTRAVENOUS | Status: AC
  Administered 2022-11-17: 400 mg via INTRAVENOUS
  Filled 2022-11-17: qty 40

## 2022-11-17 MED ORDER — MIDAZOLAM HCL 2 MG/2ML IJ SOLN: 2.0000 mg | INTRAMUSCULAR | Status: DC | PRN

## 2022-11-17 MED ORDER — HEPARIN (PORCINE) 25000 UT/250ML-% IV SOLN: 900.0000 [IU]/h | INTRAVENOUS | Status: DC

## 2022-11-17 MED ORDER — GLYCOPYRROLATE 0.2 MG/ML IJ SOLN: 0.2000 mg | INTRAMUSCULAR | Status: DC | PRN

## 2022-11-17 MED ORDER — MEDIHONEY WOUND/BURN DRESSING EX PSTE
1.0000 | PASTE | Freq: Every day | CUTANEOUS | Status: DC
  Administered 2022-11-17: 1 via TOPICAL
  Filled 2022-11-17: qty 44

## 2022-11-17 MED ORDER — LEVETIRACETAM IN NACL 1000 MG/100ML IV SOLN: 1000.0000 mg | Freq: Two times a day (BID) | INTRAVENOUS | Status: DC

## 2022-11-17 MED ORDER — SODIUM CHLORIDE 0.9 % IV SOLN
50.0000 mg | Freq: Two times a day (BID) | INTRAVENOUS | Status: DC
  Administered 2022-11-17 – 2022-11-25 (×16): 50 mg via INTRAVENOUS
  Filled 2022-11-17 (×18): qty 5

## 2022-11-17 MED ORDER — LEVETIRACETAM IN NACL 1500 MG/100ML IV SOLN
1500.0000 mg | Freq: Two times a day (BID) | INTRAVENOUS | Status: DC
  Administered 2022-11-17 – 2022-11-25 (×16): 1500 mg via INTRAVENOUS
  Filled 2022-11-17 (×18): qty 100

## 2022-11-17 MED ORDER — POLYVINYL ALCOHOL 1.4 % OP SOLN: 1.0000 [drp] | Freq: Four times a day (QID) | OPHTHALMIC | Status: DC | PRN

## 2022-11-17 MED ORDER — SODIUM CHLORIDE 0.9 % IV SOLN
500.0000 mg | INTRAVENOUS | Status: DC
  Filled 2022-11-17: qty 10

## 2022-11-17 MED ORDER — ACETAMINOPHEN 325 MG PO TABS: 650.0000 mg | ORAL_TABLET | Freq: Four times a day (QID) | ORAL | Status: DC | PRN

## 2022-11-17 MED ORDER — SODIUM CHLORIDE 0.9 % IV SOLN
3000.0000 mg | INTRAVENOUS | Status: AC
  Administered 2022-11-17: 3000 mg via INTRAVENOUS
  Filled 2022-11-17: qty 30

## 2022-11-17 MED ORDER — SODIUM CHLORIDE 0.9 % IV SOLN: INTRAVENOUS | Status: DC

## 2022-11-17 MED ORDER — GLYCOPYRROLATE 1 MG PO TABS
1.0000 mg | ORAL_TABLET | ORAL | Status: DC | PRN
  Filled 2022-11-17: qty 1

## 2022-11-17 MED ORDER — MORPHINE SULFATE (PF) 2 MG/ML IV SOLN: 2.0000 mg | INTRAVENOUS | Status: DC | PRN

## 2022-11-17 MED ORDER — ACETAMINOPHEN 650 MG RE SUPP: 650.0000 mg | Freq: Four times a day (QID) | RECTAL | Status: DC | PRN

## 2022-11-17 NOTE — Progress Notes (Signed)
ANTICOAGULATION CONSULT NOTE - Initial Consult  Pharmacy Consult for heparin Indication: history of PE  Allergies  Allergen Reactions   Sulfa Antibiotics Anaphylaxis, Shortness Of Breath and Swelling   Chlorhexidine Other (See Comments)    Reaction not recalled    Patient Measurements: Weight: 57.1 kg (125 lb 14.1 oz) Heparin Dosing Weight: 57kg  Vital Signs: Temp: 98.7 F (37.1 C) (05/31 0400) Temp Source: Axillary (05/31 0400) BP: 110/78 (05/31 0600) Pulse Rate: 99 (05/31 0600)  Labs: Recent Labs    11/16/22 1305 11/16/22 1332  HGB 14.7 15.6*  HCT 48.6* 46.0  PLT 486*  --   CREATININE 0.87  --     Estimated Creatinine Clearance: 51.2 mL/min (by C-G formula based on SCr of 0.87 mg/dL).   Medical History: Past Medical History:  Diagnosis Date   Anemia    DM II (diabetes mellitus, type II), controlled (HCC)    Gait disorder    HTN (hypertension)    Lymphedema    MS (multiple sclerosis) (HCC)    Pulmonary embolism (HCC)     Medications:  Medications Prior to Admission  Medication Sig Dispense Refill Last Dose   acetaminophen (TYLENOL) 500 MG tablet Take 500-1,000 mg by mouth See admin instructions. Take 1,000 mg by mouth at bedtime and an additional 500-1,000 mg once a day as needed for pain or headaches   11/16/2022   Ascorbic Acid (VITAMIN C) 1000 MG tablet Take 1,000 mg by mouth daily.   unk   carvedilol (COREG) 6.25 MG tablet Take 6.25 mg by mouth in the morning.   11/16/2022 at 10am   [employer] D3 50 MCG (2000 UT) CAPS Take 2,000 Units by mouth daily.   unk   ELIQUIS 5 MG TABS tablet Take 5 mg by mouth 2 (two) times daily.   11/16/2022 at 10am   ENTRESTO 24-26 MG Take 1 tablet by mouth 2 (two) times daily.   11/16/2022 at 10am   fluticasone (FLONASE) 50 MCG/ACT nasal spray Place 1 spray into both nostrils in the morning.   unk   ketoconazole (NIZORAL) 2 % cream Apply 1 Application topically 2 (two) times daily as needed for irritation (affected finger).   unk    levocetirizine (XYZAL) 5 MG tablet Take 5 mg by mouth every evening.   11/16/2022   liver oil-zinc oxide (DESITIN) 40 % ointment Apply 1 application topically as needed for irritation. (Patient taking differently: Apply 1 application  topically as needed for irritation (affected areas).) 56.7 g 0 unk   methocarbamol (ROBAXIN) 500 MG tablet Take 500 mg by mouth in the morning and at bedtime.   11/16/2022   Multiple Vitamin (MULTIVITAMIN WITH MINERALS) TABS tablet Take 1 tablet by mouth daily.   unk   Naphazoline-Glycerin-Zinc Sulf (CLEAR EYES MAXIMUM ITCHY EYE OP) Place 1 drop into both eyes in the morning.   unk   omeprazole (PRILOSEC) 20 MG capsule Take 1 capsule (20 mg total) by mouth 2 (two) times daily before a meal. (Patient taking differently: Take 20 mg by mouth daily before breakfast.) 60 capsule 0 11/16/2022   polyethylene glycol (MIRALAX / GLYCOLAX) 17 g packet Take 17 g by mouth daily. 14 each 0 unk   senna-docusate (SENOKOT-S) 8.6-50 MG tablet Take 1 tablet by mouth at bedtime. 30 tablet 0 11/16/2022   traZODone (DESYREL) 100 MG tablet Take 100 mg by mouth at bedtime.   11/15/2022   Zinc Sulfate 220 (50 Zn) MG TABS Take 220 mg by mouth daily.  unk   Scheduled:   insulin aspart  0-15 Units Subcutaneous Q4H   leptospermum manuka honey  1 Application Topical Daily    Assessment: 67 yo female with new onset seizures. She is on apixaban PTA (last dose 5/30 in the morning) and plans are to hold oral anticoagulation. Pharmacy consulted to dose heparin    Goal of Therapy:  Heparin level 0.3-0.7 units/ml aPTT 66-102 seconds Monitor platelets by anticoagulation protocol: Yes   Plan:  -No heparin bolus -Start heparin at 900 units/hr -Heparin level and aPTT in 6 hours -Daily heparin level, aPTT and CBC  Harland German, PharmD Clinical Pharmacist **Pharmacist phone directory can now be found on [email or web address] ([email or web address]).  Listed under [facility].

## 2022-11-17 NOTE — Progress Notes (Addendum)
Subjective: Patient has had multiple seizures arising from left posterior quadrant overnight.   ROS: Unable to obtain due to poor mental status  Examination  Vital signs in last 24 hours: Temp:  [97.5 F (36.4 C)-99 F (37.2 C)] 98.7 F (37.1 C) (05/31 0400) Pulse Rate:  [96-133] 104 (05/31 0900) Resp:  [0-22] 17 (05/31 1000) BP: (101-136)/(61-106) 111/66 (05/31 1000) SpO2:  [93 %-100 %] 99 % (05/31 0900) Weight:  [57.1 kg] 57.1 kg (05/31 0420)  General: lying in bed, NAD Neuro: Awake, does look at examiner but does not track examiner in room, states ouch when noxious stimuli is applied, aphasic, PERRLA, no forced gaze deviation, withdraws in all 4 extremities after noxious stimulation, has spastic flexion of left upper extremity  Basic Metabolic Panel: Recent Labs  Lab 11/12/22 0531 11/13/22 0442 11/16/22 1305 11/16/22 1332  NA 136 136 138 143  K 3.7 4.7 3.6 3.1*  CL 118* 117* 115*  --   CO2 11* 12* 13*  --   GLUCOSE 100* 76 108*  --   BUN 31* 29* 26*  --   CREATININE 0.70 0.69 0.87  --   CALCIUM 8.0* 8.1* 8.3*  --   MG 1.8  --  1.5*  --     CBC: Recent Labs  Lab 11/11/22 0529 11/12/22 0531 11/13/22 0442 11/16/22 1305 11/16/22 1332  WBC 10.3 11.2* 9.9 18.6*  --   NEUTROABS 7.5 7.7 5.6 14.3*  --   HGB 13.9 13.6 13.4 14.7 15.6*  HCT 45.7 44.4 41.2 48.6* 46.0  MCV 85.3 85.5 80.8 85.0  --   PLT 382 411* 447* 486*  --      Coagulation Studies: No results for input(s): "LABPROT", "INR" in the last 72 hours.  Imaging MRI brain with and without contrast, MRA head without contrast, MRA neck with and without contrast 11/16/2022: 1. Regions of diffusion restriction in the left thalamus and anterior left temporal lobe are favored to represent postictal changes. 2. Multiple periventricular, pericallosal, and subcortical T2/FLAIR hyperintense lesions that are favored to represent demyelinating lesions. These lesions do not definitively demonstrate diffusion restriction  or contrast enhancement to suggest active demyelination. 3. Moderate to severe narrowing in the cavernous segments of bilateral ICAs. 4. There is absent signal at the origin of the right vertebral artery, which may be due to motion artifact or severe stenosis. Recommend further evaluation with a CTA head/neck if more definitive characterization is clinically warranted. 5. Moderate narrowing of the origin of the left ICA.    ASSESSMENT AND PLAN: 67 year old female with multiple sclerosis, quadriplegia/bedbound status, prior PE anticoagulated with Eliquis who presented with seizures.  New onset seizures -Etiology: Unclear, does appear to have UTI, urine culture is pending.  Recommendations -Will load with Vimpat 400 mg once and start 50 mg twice daily -Will increase Keppra to 1500 mg twice daily -Will DC CTA head and neck as patient presentation is secondary to seizures -Continue LTM EEG while we adjust antiseizure medications -Will need to discuss goals of care with family because seizures are involving left hemisphere and patient appears aphasic.  At baseline patient is already quadriplegic/bedbound. -continue seizure precautions -As needed IV Versed for clinical seizures lasting more than 2 minutes -Discussed plan with ICU team at bedside  ADDENDUM -ICU team spoke with patient's family.  Patient is being transitioned to comfort care.  Will DC LTM EEG  I have spent a total of  37  minutes with the patient reviewing hospital notes,  test results,  labs and examining the patient as well as establishing an assessment and plan.  > 50% of time was spent in direct patient care.   Lindie Spruce Epilepsy [facility] For questions after 5pm please refer to AMION to reach the Neurologist on call

## 2022-11-17 NOTE — Progress Notes (Signed)
NAME:  Jocelyn Sanchez, MRN:  [id number], DOB:  May 08, 1956, LOS: 1 ADMISSION DATE:  11/16/2022, CONSULTATION DATE:  11/16/2022 REFERRING MD:  Dr. Rush Landmark - EDP, CHIEF COMPLAINT: New onset seizures  History of Present Illness:  Jocelyn Sanchez is a 67 y.o. female with a PMH significant for multiple sclerosis with quadriplegia/bedbound status, chronic sacral ulcer, hypertension, lymphedema, prior pulmonary embolism anticoagulated with Eliquis, and anemia who presented to the ED via EMS 5/30 with reports of seizure-like activity.  Per family seizure activity lasted approximately 30 mins prior to EMS.   On ED arrival patient was seen tachycardic with pinpoint pupils after receiving 5MG  IM versed per EMS, narcan given with no improvement seen. Nasal trumpet was placed. Labor significant for Cl 115, BUN 26, Mg 1.5, Albumin 2.4, AST 47, WBC 18.6, Plt 486. UA with large leukocytes and rare bacteria. CT head with asymmetric edema in the left cerebral hemisphere Possibly secondary to postictal state, but early large left MCA territory infarct is also possible. MRI brain ordered.   Pertinent  Medical History  multiple sclerosis with quadriplegia/bedbound status, chronic sacral ulcer, hypertension, lymphedema, prior pulmonary embolism anticoagulated with Eliquis, and anemia   Significant Hospital Events: Including procedures, antibiotic start and stop dates in addition to other pertinent events   5/30 presented with new onset seizures head CT with asymmetric edema, MRI pending   5/31 ongoing seizures up to 7 per hour per neuro  Interim History / Subjective:  Remains altered. Seizures ongoing, up to 7 per hour arising from the left hemisphere.  Objective   Blood pressure 110/78, pulse 99, temperature 98.7 F (37.1 C), temperature source Axillary, resp. rate 19, weight 57.1 kg, SpO2 99 %.        Intake/Output Summary (Last 24 hours) at 11/17/2022 0903 Last data filed at 11/17/2022 0636 Gross per 24  hour  Intake 189.6 ml  Output 250 ml  Net -60.4 ml    Filed Weights   11/16/22 1911 11/17/22 0420  Weight: 57.1 kg 57.1 kg    Examination: General: Acute on neck ill-appearing deconditioned elderly female lying in bed in no acute distress HEENT: [place]/AT, MM pink/moist,  Neuro: Awake but does not answer questions or follow commands. Does withdraw to noxious stimuli CV: RRR, no M/R/G PULM: Normal effort. CTAB GI: soft, bowel sounds active in all 4 quadrants, non-tender, non-distended Extremities: warm/dry, no edema  Skin: no rashes or lesions   Assessment & Plan:   New onset seizures with status epilepticus. P: Neuro following, appreciate the assistance Keppra dose adjusted per neuro Might need to add Vimpat or other 2nd AED Neuro going to try and discuss with family as there is concern for speech deficits moving forward  Multiple sclerosis with quadriplegia/bedbound status. Chronic sacral ulcer with hx of quadriplegia (POA). P: Supportive care Local would care  Pressure alleviating devices   Acute metabolic encephalopathy - 2/2 above. P: Supportive care Ongoing discussions with family regarding goals of care in light of above  Leukocytosis with hx of recent ESBL UTI (5/22, treated with Zosyn). P: F/u on PCT, urine culture  Trend CBC and fever curve  Hold on Abx for now   Hx combined heart failure HFrEF (echo Feb 2019 with EF 40-45% with severe hypokinesis of the inferolateral myocardium and G1DD). Essential hypertension. P: Continuous telemetry  Strict intake and output  Daily weight to assess volume status Daily assessment for need to diurese  Closely monitor renal function and electrolytes  Repeat ECHO  Hold  home Carvedilol, Entresto  Prior pulmonary embolism anticoagulated with Eliquis. At risk for worsening hypoxic respiratory failure in the setting of status epilepticus. P: Confirmed with family patient is a DNI/DNR on admission  Continue supplemental  oxygen  Routine bronchial hygiene Will start heparin gtt as pt unable to take PO currently Follow intermittent CXR  Type 2 diabetes. P: SSI CBG goal 140-180  DNR/DNI  on admission. - Supportive care.  Best Practice (right click and "Reselect all SmartList Selections" daily)   Diet/type: NPO DVT prophylaxis: systemic heparin GI prophylaxis: PPI Lines: N/A Foley:  N/A Code Status:  DNR Last date of multidisciplinary goals of care discussion: Continue to update family daily   Critical care time: 30 min.   Rutherford Guys, PA - [facility] For pager details, please see AMION or use Epic chat  After 1900, please call [facility] for cross coverage needs 11/17/2022, 9:32 AM

## 2022-11-17 NOTE — Consult Note (Addendum)
WOC Nurse Consult Note: Reason for Consult:Pressure injuries. Patient seen by my associate H. Bullins on 11/09/22.  Please see note from that encounter. I will continue POC initiated at that time.   Wound type: Pressure 1.  Stage 3 Pressure Injury upper R buttock and sacrum  2.  Unstageable Pressure Injury R buttock (below Stage 3)  3.  Deep Tissue Pressure Injury L medial heel  Pressure Injury POA: yes Measurement (Per H. Bullins on 11/09/22): 1.  Stage 3 PI upper Right buttock 2 cm x 3 cm x 1 cm with 1.4 cm undermining from 12 o'clock to 3 o'clock 100% pink and moist  2.  Unstageable Pressure Injury R buttock (below Stage 3) 1 cm x 1 cm 100% yellow necrotic tissue  3.  Sacral Stage 3 Pressure Injury 1 cm x 2 cm x 1 cm 100% pink and moist  4.  Deep Tissue Pressure Injury L medial heel 2 cm x 3 cm purple maroon discoloration, skin intact  Pressure Injury POA: Yes  Dressing procedure/placement/frequency: A mattress replacement is provided as are Prevalon boots.   Cleanse R upper buttock and sacral wounds with NS, using Q tip applicator apply saline moistened (not saturated) Kerlix to wound bed twice daily making sure to cover undermining in R upper buttock wound.  Cover with dry gauze and silicone foam or ABD pad whichever is preferred.  Clean R buttock Unstageable PI (the one with yellow/white tissue) with NS, apply Medihoney to wound bed daily, cover with dry gauze and silicone foam or ABD pad whichever is preferred.  Change silicone foam dressing q3 days and prn soiling.  DTPI L medial heel cover with silicone foam dressing. Lift silicone foam daily to assess area. Change silicone foam q3 days and prn soiling.   WOC nursing team will not follow, but will remain available to this patient, the nursing and medical teams.  Please re-consult if needed.  Thank you for inviting [place] to participate in this patient's Plan of Care.  Ladona Mow, MSN, RN, CNS, GNP, Leda Min, [employer], [employer] phone:  [phone]

## 2022-11-17 NOTE — Progress Notes (Signed)
eLink Physician-Brief Progress Note Patient Name: Jocelyn MOOTS DOB: Apr 09, 1956 MRN: [id number]   Date of Service  11/17/2022  HPI/Events of Note  67 year old female with a history of multiple sclerosis who is bedbound and quadriplegic presented to the ED with report of seizure-like activity.   eICU Interventions  Patient seen.  Chart reviewed.  Imaging with reports reviewed.  Neurology recommendations noted. Patient without IV access but at this point, placing a central line has more risks than benefits.  Will await a.m. team to place a PICC line.  Prognosis is poor.  She remains DNR.     Intervention Category Evaluation Type: New Patient Evaluation  Carilyn Goodpasture 11/17/2022, 6:25 AM

## 2022-11-17 NOTE — Plan of Care (Signed)
Called by EEG monitoring tech regarding excessive muscle artifact but also possible abnormalities.  Eeg with a lot of muscle artifact. Asymmetric slowing on the right.  Will give Keppra 3g IV load  Will increase keppra dosing to 1g BID  If has seizures, will need escalation of Keppra and possible addition of second AED.  -- Milon Dikes, MD Neurologist [facility] Pager: [phone]

## 2022-11-17 NOTE — Progress Notes (Signed)
Nutrition Brief Note  Pt met criteria for RD nutrition assessment upon initial screen. Chart reviewed. Pt now transitioning to comfort care.  No further nutrition interventions planned at this time.  Please re-consult as needed.   Romelle Starcher MS, RDN, LDN, CNSC Registered Dietitian 3 Clinical Nutrition RD Pager and On-Call Pager Number Located in [place]

## 2022-11-17 NOTE — Procedures (Addendum)
[name]  MRN: [id number]  Epilepsy Attending: Charlsie Quest  Referring Physician/Provider: Lynnae January, NP  Duration: 11/17/2022 0022 to 1103  Patient history: 67 yo F patient with a history of multiple sclerosis, quadriplegia, bedbound chronic sacral ulcers, hypertension, and lymphedema, on anticoagulation for pulmonary embolism presented to the ED with reports of seizure-like activity according to family lasted about 30 minutes prior to EMS arrival. EEG to evaluate for seizure.  Level of alertness:  lethargic   AEDs during EEG study: LEV  Technical aspects: This EEG study was done with scalp electrodes positioned according to the 10-20 International system of electrode placement. Electrical activity was reviewed with band pass filter of 1-70Hz , sensitivity of 7 uV/mm, display speed of 40mm/sec with a 60Hz  notched filter applied as appropriate. EEG data were recorded continuously and digitally stored.  Video monitoring was available and reviewed as appropriate.  Description: EEG showed continuous generalized and lateralized left hemisphere low amplitude 2 to 3 Hz delta slowing.  Lateralized paretic discharges were noted in left hemisphere, maximal left posterior quadrant with fluctuating frequency of 0.25 to 2.5 Hz.  Seizures without clinical signs were also noted.  EEG showed 2 to 2.5 Hz sharp waves in left hemisphere, maximal left posterior quadrant admixed with 5 to 7 Hz theta slowing which then evolved into 2 to 3 Hz delta slowing and appeared more sharply contoured.  Average 7 seizures were noted per hour, lasting about 30 seconds to 1 minute. Hyperventilation and photic stimulation were not performed.     Of note, study was technically difficult due to significant myogenic artifact.  ABNORMALITY - Seizure without clinical signs, left hemisphere, maximal left posterior quadrant - Lateralized periodic discharges, left hemisphere, maximal left posterior quadrant -  Continuous slow, generalized and lateralized left hemisphere  IMPRESSION: This technically difficult study showed seizures without clinical signs arising from left hemisphere, maximal left posterior quadrant, average 7 seizures/ hour, lasting 30 seconds to 1 minute each.  Additionally there was evidence of epileptogenicity and cortical dysfunction arising from left hemisphere likely secondary to underlying structural abnormality, seizures.  Lastly there was severe diffuse encephalopathy.  Aasir Daigler Annabelle Harman

## 2022-11-17 NOTE — Progress Notes (Signed)
LTM EEG hooked up and running - no initial skin breakdown - push button tested - [facility] monitoring.  

## 2022-11-17 NOTE — Progress Notes (Signed)
LTM EEG discontinued - no skin breakdown at unhook.   

## 2022-11-17 NOTE — Progress Notes (Signed)
PCCM Brief Note  Dr. Tonia Brooms had an extensive bedside discussion with pt's family regarding the current circumstances, poor prognosis, and likely poor quality of life for Jocelyn Sanchez.  They discussed the patient's prior wishes under circumstances such as this and what would be acceptable in terms of quality of life etc.  The family has decided to offer full comfort care for her. They are aware that the she may be transferred to the palliative care floor for continued comfort care needs. They have been fully updated on the process and expectations and all questions have been answered.   Comfort orders placed. Will leave antiepileptics in place but otherwise d/c all labs/imaging/etc.   Rutherford Guys, PA - [facility] For pager details, please see AMION or use Epic chat  After 1900, please call [facility] for cross coverage needs 11/17/2022, 10:51 AM

## 2022-11-18 DIAGNOSIS — R569 Unspecified convulsions: Secondary | ICD-10-CM | POA: Diagnosis not present

## 2022-11-18 LAB — URINE CULTURE: Culture: NO GROWTH

## 2022-11-18 NOTE — Evaluation (Signed)
Clinical/Bedside Swallow Evaluation Patient Details  Name: Jocelyn Sanchez MRN: [id number] Date of Birth: [birth year]  Today's Date: 11/18/2022 Time: SLP Start Time (ACUTE ONLY): 1123 SLP Stop Time (ACUTE ONLY): 1146 SLP Time Calculation (min) (ACUTE ONLY): 23 min  Past Medical History:  Past Medical History:  Diagnosis Date   Anemia    DM II (diabetes mellitus, type II), controlled (HCC)    Gait disorder    HTN (hypertension)    Lymphedema    MS (multiple sclerosis) (HCC)    Pulmonary embolism (HCC)    Past Surgical History:  Past Surgical History:  Procedure Laterality Date   ENTEROSCOPY N/A 08/10/2020   Procedure: ENTEROSCOPY;  Surgeon: Kerin Salen, MD;  Location: [facility];  Service: Gastroenterology;  Laterality: N/A;   ESOPHAGOGASTRODUODENOSCOPY N/A 07/12/2016   Procedure: ESOPHAGOGASTRODUODENOSCOPY (EGD);  Surgeon: Hilarie Fredrickson, MD;  Location: [facility];  Service: Endoscopy;  Laterality: N/A;   ESOPHAGOGASTRODUODENOSCOPY N/A 02/11/2019   Procedure: ESOPHAGOGASTRODUODENOSCOPY (EGD);  Surgeon: Jeani Hawking, MD;  Location: [facility];  Service: Endoscopy;  Laterality: N/A;   ESOPHAGOGASTRODUODENOSCOPY (EGD) WITH PROPOFOL N/A 11/10/2022   Procedure: ESOPHAGOGASTRODUODENOSCOPY (EGD) WITH PROPOFOL;  Surgeon: Charlott Rakes, MD;  Location: [facility];  Service: Gastroenterology;  Laterality: N/A;   FLEXIBLE SIGMOIDOSCOPY Left 03/09/2015   Procedure: FLEXIBLE SIGMOIDOSCOPY;  Surgeon: Jeani Hawking, MD;  Location: [facility];  Service: Endoscopy;  Laterality: Left;   HOT HEMOSTASIS N/A 02/11/2019   Procedure: HOT HEMOSTASIS (ARGON PLASMA COAGULATION/BICAP);  Surgeon: Jeani Hawking, MD;  Location: [facility];  Service: Endoscopy;  Laterality: N/A;   HOT HEMOSTASIS N/A 08/10/2020   Procedure: HOT HEMOSTASIS (ARGON PLASMA COAGULATION/BICAP);  Surgeon: Kerin Salen, MD;  Location: [facility];  Service: Gastroenterology;  Laterality: N/A;   HPI:  67 year old female past medical  history of multiple sclerosis, quadriplegic bedbound chronic sacral ulcer hypertension lymphedema prior pulmonary embolism.  Admitted with concern for status elipticus. BSE 2021 presented with gagging with solids and suspected GI issues contributing to gagging. Pt preferred full liquid diet.    Assessment / Plan / Recommendation  Clinical Impression  Family present stating pt ate regular foods/liquids prior to admission. Pt awake, eyes open intermittently and consumed approximately 3 bites applesauce and sips water, needing encouragement to continue. Able to accept spoon with incoordination initially due to cognitive impairments and needed slighlty more time to coordinate sucking from straw. Appeared to transit applesauce and thin without significant difficulty. Sublte throat clear with thin liquid via straw x 1. Pt eventually declined more liquid. She made no attempts to masticat e solid cracker and was removed. Recommend Dys 1 (puree), thin liquids, crush pils only when adequately awake. Discussed with pt's family her current limitations and how that may affect her swallow ability re: intake and efficiency/safety of swallow. They voiced understanding. Apparently she was made comfort care yesterday and that decision was reversed today. ST will briefly follow for education and ability to upgrade texture if pt able. SLP Visit Diagnosis: Dysphagia, unspecified (R13.10)    Aspiration Risk  Moderate aspiration risk;Mild aspiration risk    Diet Recommendation Dysphagia 1 (Puree);Thin liquid   Liquid Administration via: Straw;Cup Medication Administration: Crushed with puree Supervision: Full supervision/cueing for compensatory strategies Compensations: Slow rate;Small sips/bites;Minimize environmental distractions (feed when alert) Postural Changes: Seated upright at 90 degrees    Other  Recommendations Oral Care Recommendations: Oral care BID    Recommendations for follow up therapy are one component  of a multi-disciplinary discharge planning process, led by the attending physician.  Recommendations may be updated based on patient status, additional functional criteria and insurance authorization.  Follow up Recommendations  (TBD)      Assistance Recommended at Discharge    Functional Status Assessment Patient has had a recent decline in their functional status and demonstrates the ability to make significant improvements in function in a reasonable and predictable amount of time.  Frequency and Duration min 1 x/week  2 weeks       Prognosis Prognosis for improved oropharyngeal function: Fair Barriers to Reach Goals: Cognitive deficits      Swallow Study   General Date of Onset: 11/16/22 HPI: 67 year old female past medical history of multiple sclerosis, quadriplegic bedbound chronic sacral ulcer hypertension lymphedema prior pulmonary embolism.  Admitted with concern for status elipticus. BSE 2021 presented with gagging with solids and suspected GI issues contributing to gagging. Pt preferred full liquid diet. Type of Study: Bedside Swallow Evaluation Previous Swallow Assessment:  (see HPI) Diet Prior to this Study: NPO Temperature Spikes Noted: No Respiratory Status: Room air History of Recent Intubation: No Behavior/Cognition: Requires cueing (awake, needed encouragement) Oral Cavity Assessment:  (difficult to fully view) Oral Care Completed by SLP: No Oral Cavity - Dentition: Missing dentition Vision:  (unknown) Self-Feeding Abilities: Total assist Patient Positioning: Upright in bed Baseline Vocal Quality: Low vocal intensity Volitional Cough: Cognitively unable to elicit Volitional Swallow: Unable to elicit    Oral/Motor/Sensory Function Overall Oral Motor/Sensory Function:  (did not follow commands)   Ice Chips Ice chips: Not tested   Thin Liquid Thin Liquid: Impaired Presentation: Straw Pharyngeal  Phase Impairments: Throat Clearing - Delayed    Nectar Thick  Nectar Thick Liquid: Not tested   Honey Thick Honey Thick Liquid: Not tested   Puree Puree: Within functional limits   Solid     Solid: Impaired Oral Phase Impairments: Poor awareness of bolus;Reduced lingual movement/coordination Oral Phase Functional Implications:  (removed from oral cavity) Pharyngeal Phase Impairments:  (no pharyngeal swallow- removed from oral cavity)      Roque Cash, Breck Coons 11/18/2022,12:33 PM

## 2022-11-18 NOTE — Progress Notes (Signed)
PROGRESS NOTE  Jocelyn TIERNAN  DOB: 1955/11/27  PCP: Bethanie Dicker, FNP ZOX:[id number]  DOA: 11/16/2022  LOS: 2 days  Hospital Day: 3  Brief narrative: Jocelyn Sanchez is a 67 y.o. female with PMH significant for multiple sclerosis with quadriplegia/bedbound status, chronic sacral ulcer, prior PE on Eliquis, anemia, DM2, hypertension, lymphedema.  5/30, patient was brought to the ED by EMS with seizures. No history of seizure,.  Family noted patient was unwell, not responding and foaming from her mouth.. EMS noted rhythmic shaking of all extremities as well as horizontal rhythmic moving of eyes.  She was given IM Versed.  Remained tachycardic and had notable pinpoint pupil.  Narcan was given without improvement.  Patient was given BMV ventilation and brought to the ED.   DNR  CT head with asymmetric edema in the left cerebral hemisphere possibly secondary to postictal state, but early large left MCA territory infarct is also possible.  Started on Keppra.  Neurology consulted Admitted to ICU for status epilepticus 5/31, patient had prolonged seizure and EEG.   5/31, PCCM Dr. Tonia Brooms had an extensive bedside discussion with patient's family regarding her current circumstances, poor prognosis and poor quality of life.  Family made a choice to transition her to full comfort care Transferred to [facility]  Subjective: Patient was seen and examined this morning. In the last 24 hours, patient had fever heart rate in low 100s, blood pressure 90s, breathing on room Elderly African-American female.  Alert, awake, mumbling.  Unable to answer orientation questions.  Multiple family members at bedside including son and sister.   It seems to me that family does not have a clear mind made yet about comfort care.  That is because of some improvement in her mental status did noticed this morning.  However they understand that her mental status may not improve significantly from here.  She also has ongoing  issues with swallowing for last several weeks. Family would like to continue aggressive management but DNR for next 24 hours at least before committing to comfort care only.  Also requested speech therapy evaluation.  Family is not willing to consider tube feeding. I discontinued the comfort care order set..  Assessment and plan: New onset seizure Status epilepticus Primarily admitted for seizure.   AEDs to continue to prevent further seizures Currently on Keppra, Vimpat and as needed Versed IV  Acute metabolic encephalopathy Family states he had baseline normal cognitive function.  For seizure, mental status resolved.  She is alert and awake this morning giving some hope to the family.  Remains disoriented however.  Continue to monitor for next 24 to 48 hours   Multiple sclerosis with quadriplegia/bedbound status Chronic sacral ulcer with hx of quadriplegia (POA) Continue supportive care Local would care  Pressure alleviating devices    Leukocytosis hx of recent ESBL UTI 5/22, treated with Zosyn). Urinalysis showed cloudy urine with large hemoglobin, large leukocytes, no bacteria.  Urine culture unremarkable. Off antibiotics currently WBC count elevated to 18.16. Continue to monitor. Recent Labs  Lab 11/12/22 0531 11/13/22 0442 11/16/22 1305  WBC 11.2* 9.9 18.6*  LATICACIDVEN  --   --  1.3   H/o combined systolic and diastolic CHF Essential hypertension PTA on Coreg, Entresto, currently not on meds because of no oral intake   Prior pulmonary embolism  Eliquis to resume if able to swallow  Type 2 diabetes SSI/Accu-Cheks  Pressure ulcers - POA Pressure Injury 11/09/22 Heel Left Deep Tissue Pressure Injury - Purple or maroon  localized area of discolored intact skin or blood-filled blister due to damage of underlying soft tissue from pressure and/or shear. 2 cm x 3 cm (Active)  11/09/22 0700  Location: Heel  Location Orientation: Left  Staging: Deep Tissue Pressure  Injury - Purple or maroon localized area of discolored intact skin or blood-filled blister due to damage of underlying soft tissue from pressure and/or shear.  Wound Description (Comments): 2 cm x 3 cm  Present on Admission:      Pressure Injury 11/09/22 Buttocks Right Stage 3 -  Full thickness tissue loss. Subcutaneous fat may be visible but bone, tendon or muscle are NOT exposed. 2 cm x 3 cm x 1 cm w/1.4 cm undermining from 12-3 o'clock (Active)  11/09/22 0700  Location: Buttocks  Location Orientation: Right  Staging: Stage 3 -  Full thickness tissue loss. Subcutaneous fat may be visible but bone, tendon or muscle are NOT exposed.  Wound Description (Comments): 2 cm x 3 cm x 1 cm w/1.4 cm undermining from 12-3 o'clock  Present on Admission: Yes     Pressure Injury 11/16/22 Elbow Anterior;Left Wound from arm contracture to elbow crevice, stage 2/ skin tear (Active)  11/16/22 2000  Location: Elbow  Location Orientation: Anterior;Left  Staging:   Wound Description (Comments): Wound from arm contracture to elbow crevice, stage 2/ skin tear  Present on Admission: Yes     Mobility: Essentially bedbound  Goals of care   Code Status: DNR.   PCCM had extensive discussion with patient's family yesterday and made her comfort care.   In my conversation today with family, it seems to me that family does not have a clear mind made yet about comfort care.  That is because of some improvement in her mental status did noticed this morning.  However they understand that her mental status may not improve significantly from here.  She also has ongoing issues with swallowing for last several weeks. Family would like to continue aggressive management but DNR for next 24 hours at least before committing to comfort care only.  Also requested speech therapy evaluation.  Family is not willing to consider tube feeding. I discontinued the comfort care order set.. Obtain palliative care consult   DVT  prophylaxis: Start heparin subcu   Antimicrobials: None currently Fluid: Unable to start IV fluid because of poor IV access Consultants: Palliative care consulted Family Communication: Multiple family members at bedside  Status: Inpatient Level of care:  Palliative Care   Patient from: Home Anticipated d/c to: Pending clinical course Needs to continue in-hospital care:  Continue to monitor for next 24 to 48 hours for any meaningful recovery of mental status      Diet:  Diet Order     None       Scheduled Meds:   PRN meds: acetaminophen **OR** acetaminophen, midazolam, polyvinyl alcohol   Infusions:   lacosamide (VIMPAT) IV 50 mg (11/18/22 1025)   levETIRAcetam 1,500 mg (11/18/22 0841)    Antimicrobials: Anti-infectives (From admission, onward)    None       Nutritional status:  Body mass index is 22.3 kg/m.          Objective: Vitals:   11/17/22 1000 11/18/22 0736  BP: 111/66 (!) 98/58  Pulse:  (!) 102  Resp: 17 18  Temp:  99.2 F (37.3 C)  SpO2:  99%    Intake/Output Summary (Last 24 hours) at 11/18/2022 1115 Last data filed at 11/17/2022 2347 Gross per 24 hour  Intake  82.13 ml  Output 650 ml  Net -567.87 ml   Filed Weights   11/16/22 1911 11/17/22 0420  Weight: 57.1 kg 57.1 kg   Weight change:  Body mass index is 22.3 kg/m.   Physical Exam: General exam: Pleasant elderly African-American female.  Not in physical distress Skin: No rashes, lesions or ulcers. HEENT: Atraumatic, normocephalic, no obvious bleeding Lungs: Clear to auscultation bilaterally CVS: Regular rate and rhythm, no murmur GI/Abd soft, nontender, nondistended, bowel sound present CNS: Alert,, awake, mumbling, unable to answer orientation questions.  Quadriplegic at baseline due to MS Psychiatry: Sad affect Extremities: No pedal edema, no calf tenderness  Data Review: I have personally reviewed the laboratory data and studies available.  F/u labs  ordered Unresulted Labs (From admission, onward)     Start     Ordered   11/19/22 0500  CBC with Differential/Platelet  Tomorrow morning,   R       Question:  Specimen collection method  Answer:  Lab=Lab collect   11/18/22 1035   11/19/22 0500  Basic metabolic panel  Tomorrow morning,   R       Question:  Specimen collection method  Answer:  Lab=Lab collect   11/18/22 1035            Total time spent in review of labs and imaging, patient evaluation, formulation of plan, documentation and communication with family: 55 minutes  Signed, Jocelyn Glass, MD [facility] 11/18/2022

## 2022-11-18 NOTE — Progress Notes (Signed)
      Thank you for this consult. Chart reviewed. Patient was transitioned to comfort care on 5/31, but this was rescinded by family today due to some mild improvement in patient's mental status. Plan is to continue supportive interventions for 24-48 hours and then readdress goals of care.   Discussed with Dr. Pola Corn. PMT will follow-up Monday 6/3.     Sherlean Foot, NP-C Palliative Medicine   Please call Palliative Medicine team phone with any questions [phone]. For individual providers please see AMION.   No charge

## 2022-11-19 DIAGNOSIS — R569 Unspecified convulsions: Secondary | ICD-10-CM | POA: Diagnosis not present

## 2022-11-19 LAB — MAGNESIUM: Magnesium: 2.1 mg/dL (ref 1.7–2.4)

## 2022-11-19 LAB — BASIC METABOLIC PANEL
Anion gap: 11 (ref 5–15)
BUN: 20 mg/dL (ref 8–23)
CO2: 12 mmol/L — ABNORMAL LOW (ref 22–32)
Calcium: 8.6 mg/dL — ABNORMAL LOW (ref 8.9–10.3)
Chloride: 118 mmol/L — ABNORMAL HIGH (ref 98–111)
Creatinine, Ser: 0.81 mg/dL (ref 0.44–1.00)
GFR, Estimated: >60 mL/min (ref >60)
Glucose, Bld: 101 mg/dL — ABNORMAL HIGH (ref 70–99)
Potassium: 2.9 mmol/L — ABNORMAL LOW (ref 3.5–5.1)
Sodium: 141 mmol/L (ref 135–145)

## 2022-11-19 LAB — CBC WITH DIFFERENTIAL/PLATELET
Abs Immature Granulocytes: 0.15 10*3/uL — ABNORMAL HIGH (ref 0.00–0.07)
Basophils Absolute: 0.2 10*3/uL — ABNORMAL HIGH (ref 0.0–0.1)
Basophils Relative: 1 %
Eosinophils Absolute: 0.1 10*3/uL (ref 0.0–0.5)
Eosinophils Relative: 0 %
HCT: 42.6 % (ref 36.0–46.0)
Hemoglobin: 13.1 g/dL (ref 12.0–15.0)
Immature Granulocytes: 1 %
Lymphocytes Relative: 20 %
Lymphs Abs: 3.9 10*3/uL (ref 0.7–4.0)
MCH: 25.9 pg — ABNORMAL LOW (ref 26.0–34.0)
MCHC: 30.8 g/dL (ref 30.0–36.0)
MCV: 84.4 fL (ref 80.0–100.0)
Monocytes Absolute: 1.5 10*3/uL — ABNORMAL HIGH (ref 0.1–1.0)
Monocytes Relative: 8 %
Neutro Abs: 13.8 10*3/uL — ABNORMAL HIGH (ref 1.7–7.7)
Neutrophils Relative %: 70 %
Platelets: 456 10*3/uL — ABNORMAL HIGH (ref 150–400)
RBC: 5.05 MIL/uL (ref 3.87–5.11)
RDW: 27.5 % — ABNORMAL HIGH (ref 11.5–15.5)
WBC: 19.6 10*3/uL — ABNORMAL HIGH (ref 4.0–10.5)
nRBC: 0 % (ref 0.0–0.2)

## 2022-11-19 MED ORDER — POTASSIUM CHLORIDE 20 MEQ PO PACK
40.0000 meq | PACK | ORAL | Status: AC
  Administered 2022-11-19 (×3): 40 meq via ORAL
  Filled 2022-11-19 (×3): qty 2

## 2022-11-19 MED ORDER — MAGNESIUM SULFATE 2 GM/50ML IV SOLN
2.0000 g | Freq: Once | INTRAVENOUS | Status: AC
  Administered 2022-11-19: 2 g via INTRAVENOUS
  Filled 2022-11-19: qty 50

## 2022-11-19 NOTE — Progress Notes (Signed)
PROGRESS NOTE  Jocelyn Sanchez  DOB: [birth year]  PCP: Bethanie Dicker, FNP ZOX:[id number]  DOA: 11/16/2022  LOS: 3 days  Hospital Day: 4  Brief narrative: Jocelyn Sanchez is a 67 y.o. female with PMH significant for multiple sclerosis with quadriplegia/bedbound status, chronic sacral ulcer, prior PE on Eliquis, anemia, DM2, hypertension, lymphedema.  5/30, patient was brought to the ED by EMS with seizures. No history of seizure,.  Family noted patient was unwell, not responding and foaming from her mouth.. EMS noted rhythmic shaking of all extremities as well as horizontal rhythmic moving of eyes.  She was given IM Versed.  Remained tachycardic and had notable pinpoint pupil.  Narcan was given without improvement.  Patient was given BMV ventilation and brought to the ED.   DNR  CT head with asymmetric edema in the left cerebral hemisphere possibly secondary to postictal state, but early large left MCA territory infarct is also possible.  Started on Keppra.  Neurology consulted Admitted to ICU for status epilepticus 5/31, patient had prolonged seizure and EEG.   5/31, PCCM Dr. Tonia Brooms had an extensive bedside discussion with patient's family regarding her current circumstances, poor prognosis and poor quality of life.  Family made a choice to transition her to full comfort care Transferred to [facility]  Subjective: Patient was seen and examined this morning. Lying on bed.  Alert, awake, mumbles to me.  One of her sons was at bedside.  Family has noticed gradual improvement in her cognition.  She was able to swallow on speech therapy evaluation yesterday. On dysphagia 1 diet. For now, family wants to continue current level of care.  Assessment and plan: New onset seizure Status epilepticus Primarily admitted for seizure.   AEDs to continue to prevent further seizures Currently on Keppra, Vimpat and as needed Versed IV.  Once oral intake is ensured, I will switch her to oral AEDs  Acute  metabolic encephalopathy Family states he had baseline normal cognitive function.   Patient had poor home seizure leading to prolonged postictal state and altered mental status.  Alert, awake, mumbling.  Not able to answer orientation questions to me.  Family has noticed gradual improvement and is hopeful.  Continue to monitor for next 24 to 48 hours   Multiple sclerosis with quadriplegia/bedbound status Chronic sacral ulcer with hx of quadriplegia (POA) Continue supportive care Local would care  Pressure alleviating devices    Leukocytosis hx of recent ESBL UTI 5/22, treated with Zosyn). Urinalysis showed cloudy urine with large hemoglobin, large leukocytes, no bacteria.  Urine culture unremarkable. Off antibiotics currently WBC count remains elevated without other evidence of infection. Continue to monitor. Recent Labs  Lab 11/13/22 0442 11/16/22 1305 11/19/22 0422  WBC 9.9 18.6* 19.6*  LATICACIDVEN  --  1.3  --    Hypokalemia Potassium low at 2.9.  Replacement ordered.  Recheck tomorrow. Recent Labs  Lab 11/13/22 0442 11/16/22 1305 11/16/22 1332 11/19/22 0417 11/19/22 0422  K 4.7 3.6 3.1*  --  2.9*  MG  --  1.5*  --  2.1  --     H/o combined systolic and diastolic CHF Essential hypertension PTA on Coreg, Entresto, currently not on meds because of no oral intake   Prior pulmonary embolism  Eliquis to resume if able to swallow  Type 2 diabetes SSI/Accu-Cheks  Pressure ulcers - POA Pressure Injury 11/09/22 Heel Left Deep Tissue Pressure Injury - Purple or maroon localized area of discolored intact skin or blood-filled blister due to damage of  underlying soft tissue from pressure and/or shear. 2 cm x 3 cm (Active)  11/09/22 0700  Location: Heel  Location Orientation: Left  Staging: Deep Tissue Pressure Injury - Purple or maroon localized area of discolored intact skin or blood-filled blister due to damage of underlying soft tissue from pressure and/or shear.   Wound Description (Comments): 2 cm x 3 cm  Present on Admission:      Pressure Injury 11/09/22 Buttocks Right Stage 3 -  Full thickness tissue loss. Subcutaneous fat may be visible but bone, tendon or muscle are NOT exposed. 2 cm x 3 cm x 1 cm w/1.4 cm undermining from 12-3 o'clock (Active)  11/09/22 0700  Location: Buttocks  Location Orientation: Right  Staging: Stage 3 -  Full thickness tissue loss. Subcutaneous fat may be visible but bone, tendon or muscle are NOT exposed.  Wound Description (Comments): 2 cm x 3 cm x 1 cm w/1.4 cm undermining from 12-3 o'clock  Present on Admission: Yes     Pressure Injury 11/16/22 Elbow Anterior;Left Wound from arm contracture to elbow crevice, stage 2/ skin tear (Active)  11/16/22 2000  Location: Elbow  Location Orientation: Anterior;Left  Staging:   Wound Description (Comments): Wound from arm contracture to elbow crevice, stage 2/ skin tear  Present on Admission: Yes     Mobility: Essentially bedbound  Goals of care   Code Status: DNR.  Palliative care consult to follow on Monday   DVT prophylaxis: Start heparin subcu   Antimicrobials: None currently Fluid: Oral hydration encouraged Consultants: Palliative care consulted Family Communication: Son at bedside  Status: Inpatient Level of care:  Palliative Care   Patient from: Home Anticipated d/c to: Pending clinical course Needs to continue in-hospital care:  Gradually improving mental status.  Monitor for next 24 to 48 hours.    Diet:  Diet Order             DIET - DYS 1 Room service appropriate? No; Fluid consistency: Thin  Diet effective now                   Scheduled Meds:  potassium chloride  40 mEq Oral Q2H    PRN meds: acetaminophen **OR** acetaminophen, midazolam, polyvinyl alcohol   Infusions:   lacosamide (VIMPAT) IV 50 mg (11/19/22 1011)   levETIRAcetam Stopped (11/19/22 0806)   magnesium sulfate bolus IVPB      Antimicrobials: Anti-infectives  (From admission, onward)    None       Nutritional status:  Body mass index is 22.3 kg/m.          Objective: Vitals:   11/18/22 0736 11/19/22 0447  BP: (!) 98/58 (!) 129/106  Pulse: (!) 102 (!) 109  Resp: 18 16  Temp: 99.2 F (37.3 C) 98.9 F (37.2 C)  SpO2: 99% 98%    Intake/Output Summary (Last 24 hours) at 11/19/2022 1049 Last data filed at 11/19/2022 0811 Gross per 24 hour  Intake 610.33 ml  Output 700 ml  Net -89.67 ml   Filed Weights   11/16/22 1911 11/17/22 0420  Weight: 57.1 kg 57.1 kg   Weight change:  Body mass index is 22.3 kg/m.   Physical Exam: General exam: Pleasant elderly African-American female.  Not in physical distress Skin: No rashes, lesions or ulcers. HEENT: Atraumatic, normocephalic, no obvious bleeding Lungs: Clear to auscultation bilaterally CVS: Regular rate and rhythm, no murmur GI/Abd soft, nontender, nondistended, bowel sound present CNS: Alert, awake, mumbling, unable to answer orientation questions.  Quadriplegic at baseline due to MS Psychiatry: Sad affect Extremities: No pedal edema, no calf tenderness  Data Review: I have personally reviewed the laboratory data and studies available.  F/u labs ordered Unresulted Labs (From admission, onward)     Start     Ordered   11/20/22 0500  Basic metabolic panel  Daily,   R     Question:  Specimen collection method  Answer:  Lab=Lab collect   11/19/22 1049   11/20/22 0500  CBC with Differential/Platelet  Daily,   R     Question:  Specimen collection method  Answer:  Lab=Lab collect   11/19/22 1049           Total time spent in review of labs and imaging, patient evaluation, formulation of plan, documentation and communication with family: 45 minutes  Signed, Lorin Glass, MD [facility] 11/19/2022

## 2022-11-20 DIAGNOSIS — R569 Unspecified convulsions: Secondary | ICD-10-CM | POA: Diagnosis not present

## 2022-11-20 DIAGNOSIS — Z7189 Other specified counseling: Secondary | ICD-10-CM | POA: Diagnosis not present

## 2022-11-20 DIAGNOSIS — Z515 Encounter for palliative care: Secondary | ICD-10-CM | POA: Diagnosis not present

## 2022-11-20 LAB — BASIC METABOLIC PANEL
Anion gap: 14 (ref 5–15)
BUN: 19 mg/dL (ref 8–23)
CO2: 9 mmol/L — ABNORMAL LOW (ref 22–32)
Calcium: 8.8 mg/dL — ABNORMAL LOW (ref 8.9–10.3)
Chloride: 122 mmol/L — ABNORMAL HIGH (ref 98–111)
Creatinine, Ser: 0.88 mg/dL (ref 0.44–1.00)
GFR, Estimated: >60 mL/min (ref >60)
Glucose, Bld: 99 mg/dL (ref 70–99)
Potassium: 3.8 mmol/L (ref 3.5–5.1)
Sodium: 145 mmol/L (ref 135–145)

## 2022-11-20 LAB — URINALYSIS, ROUTINE W REFLEX MICROSCOPIC
Bilirubin Urine: NEGATIVE
Glucose, UA: NEGATIVE mg/dL
Ketones, ur: 5 mg/dL — AB
Nitrite: POSITIVE — AB
Protein, ur: 100 mg/dL — AB
RBC / HPF: >50 RBC/hpf (ref 0–5)
Specific Gravity, Urine: 1.011 (ref 1.005–1.030)
WBC, UA: >50 WBC/hpf (ref 0–5)
pH: 6 (ref 5.0–8.0)

## 2022-11-20 LAB — CBC WITH DIFFERENTIAL/PLATELET
Abs Immature Granulocytes: 0.18 10*3/uL — ABNORMAL HIGH (ref 0.00–0.07)
Basophils Absolute: 0.2 10*3/uL — ABNORMAL HIGH (ref 0.0–0.1)
Basophils Relative: 1 %
Eosinophils Absolute: 0.1 10*3/uL (ref 0.0–0.5)
Eosinophils Relative: 0 %
HCT: 45.1 % (ref 36.0–46.0)
Hemoglobin: 14.2 g/dL (ref 12.0–15.0)
Immature Granulocytes: 1 %
Lymphocytes Relative: 11 %
Lymphs Abs: 2.6 10*3/uL (ref 0.7–4.0)
MCH: 26.4 pg (ref 26.0–34.0)
MCHC: 31.5 g/dL (ref 30.0–36.0)
MCV: 84 fL (ref 80.0–100.0)
Monocytes Absolute: 1.3 10*3/uL — ABNORMAL HIGH (ref 0.1–1.0)
Monocytes Relative: 5 %
Neutro Abs: 20.4 10*3/uL — ABNORMAL HIGH (ref 1.7–7.7)
Neutrophils Relative %: 82 %
Platelets: UNDETERMINED 10*3/uL (ref 150–400)
RBC: 5.37 MIL/uL — ABNORMAL HIGH (ref 3.87–5.11)
RDW: 27.6 % — ABNORMAL HIGH (ref 11.5–15.5)
WBC: 24.8 10*3/uL — ABNORMAL HIGH (ref 4.0–10.5)
nRBC: 0 % (ref 0.0–0.2)

## 2022-11-20 LAB — LACTIC ACID, PLASMA: Lactic Acid, Venous: 1.4 mmol/L (ref 0.5–1.9)

## 2022-11-20 MED ORDER — SACUBITRIL-VALSARTAN 24-26 MG PO TABS
1.0000 | ORAL_TABLET | Freq: Two times a day (BID) | ORAL | Status: DC
  Administered 2022-11-20 – 2022-12-04 (×27): 1 via ORAL
  Filled 2022-11-20 (×32): qty 1

## 2022-11-20 MED ORDER — APIXABAN 5 MG PO TABS
5.0000 mg | ORAL_TABLET | Freq: Two times a day (BID) | ORAL | Status: DC
  Administered 2022-11-20 – 2022-12-04 (×29): 5 mg via ORAL
  Filled 2022-11-20 (×29): qty 1

## 2022-11-20 MED ORDER — PIPERACILLIN-TAZOBACTAM 3.375 G IVPB
3.3750 g | Freq: Three times a day (TID) | INTRAVENOUS | Status: AC
  Administered 2022-11-20 – 2022-11-25 (×15): 3.375 g via INTRAVENOUS
  Filled 2022-11-20 (×15): qty 50

## 2022-11-20 MED ORDER — CARVEDILOL 3.125 MG PO TABS
3.1250 mg | ORAL_TABLET | Freq: Two times a day (BID) | ORAL | Status: DC
  Administered 2022-11-20 – 2022-12-04 (×27): 3.125 mg via ORAL
  Filled 2022-11-20 (×26): qty 1

## 2022-11-20 NOTE — Progress Notes (Signed)
PROGRESS NOTE  Jocelyn Sanchez  DOB: August 19, 1955  PCP: Bethanie Dicker, FNP ONG:[id number]  DOA: 11/16/2022  LOS: 4 days  Hospital Day: 5  Brief narrative: Jocelyn Sanchez is a 68 y.o. female with PMH significant for multiple sclerosis with quadriplegia/bedbound status, chronic sacral ulcer, prior PE on Eliquis, anemia, DM2, hypertension, lymphedema.  5/30, patient was brought to the ED by EMS with seizures. No history of seizure,.  Family noted patient was unwell, not responding and foaming from her mouth.. EMS noted rhythmic shaking of all extremities as well as horizontal rhythmic moving of eyes.  She was given IM Versed.  Remained tachycardic and had notable pinpoint pupil.  Narcan was given without improvement.  Patient was given BMV ventilation and brought to the ED.   DNR  CT head with asymmetric edema in the left cerebral hemisphere possibly secondary to postictal state, but early large left MCA territory infarct is also possible.  Started on Keppra.  Neurology consulted Admitted to ICU for status epilepticus 5/31, patient had prolonged seizure and EEG.   5/31, PCCM Dr. Tonia Brooms had an extensive bedside discussion with patient's family regarding her current circumstances, poor prognosis and poor quality of life.  Family made a choice to transition her to full comfort care Transferred to [facility]  Subjective: Patient was seen and examined this morning. Lying on bed.  Not in distress.  Able to open eyes but only mumbles on command.  Family not at bedside today.  Earlier this morning, Perative care had a discussion with family.  Assessment and plan: New onset seizure Status epilepticus Primarily admitted for seizure.   AEDs to continue to prevent further seizures Currently on Keppra, Vimpat and as needed Versed IV.  Once oral intake is ensured, I will switch her to oral AEDs  Acute metabolic encephalopathy Family states he had baseline normal cognitive function.   Patient had poor  home seizure leading to prolonged postictal state and altered mental status.  Family has noticed gradual improvement and is hopeful.  However on my exam, patient is able to open eyes on command, only mumbles but unable to have a meaningful conversation.  Family would like to continue to monitor for next 24 to 48 hours and plan another family meeting on Wednesday 6/5.   Multiple sclerosis with quadriplegia/bedbound status Chronic sacral ulcer with hx of quadriplegia (POA) Continue supportive care Local would care  Pressure alleviating devices    SIRS hx of recent ESBL UTI 5/22, treated with Zosyn) Urinalysis showed cloudy urine with large hemoglobin, large leukocytes, no bacteria.  Urine culture unremarkable. Off antibiotics currently but WBC count continues to climb up.  Also remains tachycardic today.  No fever. Unclear etiology. I will send blood culture and urinalysis today. Start IV Zosyn empirically Recent Labs  Lab 11/16/22 1305 11/19/22 0422 11/20/22 0842  WBC 18.6* 19.6* 24.8*  LATICACIDVEN 1.3  --   --    Hypokalemia Potassium level improved with replacement. Recent Labs  Lab 11/16/22 1305 11/16/22 1332 11/19/22 0417 11/19/22 0422 11/20/22 0842  K 3.6 3.1*  --  2.9* 3.8  MG 1.5*  --  2.1  --   --    H/o combined systolic and diastolic CHF Essential hypertension PTA on Coreg, Entresto, Currently on hold.  Patient is tachycardic.  Blood pressure elevated 2.  Creatinine stable.  Resume meds today.   Prior pulmonary embolism  Eliquis to resume if able to swallow  Type 2 diabetes SSI/Accu-Cheks  Pressure ulcers - POA Pressure  Injury 11/09/22 Heel Left Deep Tissue Pressure Injury - Purple or maroon localized area of discolored intact skin or blood-filled blister due to damage of underlying soft tissue from pressure and/or shear. 2 cm x 3 cm (Active)  11/09/22 0700  Location: Heel  Location Orientation: Left  Staging: Deep Tissue Pressure Injury - Purple or  maroon localized area of discolored intact skin or blood-filled blister due to damage of underlying soft tissue from pressure and/or shear.  Wound Description (Comments): 2 cm x 3 cm  Present on Admission:      Pressure Injury 11/09/22 Buttocks Right Stage 3 -  Full thickness tissue loss. Subcutaneous fat may be visible but bone, tendon or muscle are NOT exposed. 2 cm x 3 cm x 1 cm w/1.4 cm undermining from 12-3 o'clock (Active)  11/09/22 0700  Location: Buttocks  Location Orientation: Right  Staging: Stage 3 -  Full thickness tissue loss. Subcutaneous fat may be visible but bone, tendon or muscle are NOT exposed.  Wound Description (Comments): 2 cm x 3 cm x 1 cm w/1.4 cm undermining from 12-3 o'clock  Present on Admission: Yes     Pressure Injury 11/16/22 Elbow Anterior;Left Wound from arm contracture to elbow crevice, stage 2/ skin tear (Active)  11/16/22 2000  Location: Elbow  Location Orientation: Anterior;Left  Staging:   Wound Description (Comments): Wound from arm contracture to elbow crevice, stage 2/ skin tear  Present on Admission: Yes     Mobility: Essentially bedbound  Goals of care   Code Status: DNR.  Palliative care to revisit on Wednesday.   DVT prophylaxis: Start heparin subcu apixaban (ELIQUIS) tablet 5 mg   Antimicrobials: Empiric IV Zosyn Fluid: Oral hydration encouraged Consultants: Palliative care consulted Family Communication: Son at bedside  Status: Inpatient Level of care:  Palliative Care   Patient from: Home Anticipated d/c to: Pending clinical course Needs to continue in-hospital care:  Gradually improving mental status.  Monitor for next 24 to 48 hours.    Diet:  Diet Order             DIET - DYS 1 Room service appropriate? No; Fluid consistency: Thin  Diet effective now                   Scheduled Meds:  apixaban  5 mg Oral BID   carvedilol  3.125 mg Oral BID WC   sacubitril-valsartan  1 tablet Oral BID    PRN  meds: acetaminophen **OR** acetaminophen, midazolam, polyvinyl alcohol   Infusions:   lacosamide (VIMPAT) IV 50 mg (11/20/22 1123)   levETIRAcetam 1,500 mg (11/20/22 0826)    Antimicrobials: Anti-infectives (From admission, onward)    None       Nutritional status:  Body mass index is 22.3 kg/m.          Objective: Vitals:   11/19/22 1658 11/19/22 2145  BP: 118/70 (!) 134/90  Pulse: (!) 109 (!) 115  Resp: 18 18  Temp: 98.9 F (37.2 C)   SpO2: 98% 96%    Intake/Output Summary (Last 24 hours) at 11/20/2022 1151 Last data filed at 11/20/2022 0813 Gross per 24 hour  Intake 204.46 ml  Output 850 ml  Net -645.54 ml   Filed Weights   11/16/22 1911 11/17/22 0420  Weight: 57.1 kg 57.1 kg   Weight change:  Body mass index is 22.3 kg/m.   Physical Exam: General exam: Pleasant elderly African-American female.  Not in physical distress.   Skin: No rashes, lesions  or ulcers. HEENT: Atraumatic, normocephalic, no obvious bleeding Lungs: Clear to auscultation bilaterally CVS: Regular rate and rhythm, no murmur GI/Abd soft, nontender, nondistended, bowel sound present CNS: Opens eyes on command, mumbling, unable to answer orientation questions.  Quadriplegic at baseline due to MS Psychiatry: Sad affect Extremities: No pedal edema, no calf tenderness  Data Review: I have personally reviewed the laboratory data and studies available.  F/u labs ordered Unresulted Labs (From admission, onward)     Start     Ordered   11/20/22 1151  Lactic acid, plasma  ONCE - STAT,   STAT       Question:  Specimen collection method  Answer:  Lab=Lab collect   11/20/22 1150   11/20/22 1150  Culture, blood (Routine X 2) w Reflex to ID Panel  BLOOD CULTURE X 2,   R (with TIMED occurrences)      11/20/22 1150   11/20/22 1149  Urinalysis, Routine w reflex microscopic -Urine, Unspecified Source  Once,   R       Question:  Specimen Source  Answer:  Urine, Unspecified Source   11/20/22 1150    11/20/22 0500  Basic metabolic panel  Daily,   R     Question:  Specimen collection method  Answer:  Lab=Lab collect   11/19/22 1049   11/20/22 0500  CBC with Differential/Platelet  Daily,   R     Question:  Specimen collection method  Answer:  Lab=Lab collect   11/19/22 1049           Total time spent in review of labs and imaging, patient evaluation, formulation of plan, documentation and communication with family: 45 minutes  Signed, Lorin Glass, MD [facility] 11/20/2022

## 2022-11-20 NOTE — Care Management Important Message (Signed)
Important Message  Patient Details  Name: Jocelyn Sanchez MRN: [id number] Date of Birth: Mar 08, 1956   Medicare Important Message Given:  Yes  Patient has contact precaution order in place will  mail a copy to the patient home address.    [name] 11/20/2022, 2:50 PM

## 2022-11-20 NOTE — Progress Notes (Signed)
Pharmacy Antibiotic Note  Jocelyn Sanchez is a 67 y.o. female admitted on 11/16/2022 with chest pain. Patient with sacral wound that appears infected.   Pt was recently treated with a course of zosyn for ESBL. WBC is trending up. She is being r/o for sepsis. Zosyn restarted.  Scr<1  Plan: Zosyn 3.375g IV q8 F/u cultures F/u goal of care  Weight: 57.1 kg (125 lb 14.1 oz)  Temp (24hrs), Avg:98.9 F (37.2 C), Min:98.9 F (37.2 C), Max:98.9 F (37.2 C)  Recent Labs  Lab 11/16/22 1305 11/19/22 0422 11/20/22 0842  WBC 18.6* 19.6* 24.8*  CREATININE 0.87 0.81 0.88  LATICACIDVEN 1.3  --   --      Estimated Creatinine Clearance: 50.6 mL/min (by C-G formula based on SCr of 0.88 mg/dL).    Allergies  Allergen Reactions   Sulfa Antibiotics Anaphylaxis, Shortness Of Breath and Swelling   Chlorhexidine Other (See Comments)    Reaction not recalled    Antimicrobials this admission: Vancomycin 5/22 >>5/24 Zosyn 5/22 >>5/27 6/3>> Cefepime 5/22 x 1  Dose adjustments this admission: NA  Microbiology results: 5/22 BCx: negF 5/22 UCx: ESBL e.coli 5/30 urine>>negF 6/3 blood>>  Ulyses Southward, PharmD, [place], AAHIVP, CPP Infectious Disease Pharmacist 11/20/2022 12:55 PM

## 2022-11-20 NOTE — Progress Notes (Signed)
Ok to resume apixaban per Dr Pola Corn.  Ulyses Southward, PharmD, BCIDP, AAHIVP, CPP Infectious Disease Pharmacist 11/20/2022 9:33 AM

## 2022-11-20 NOTE — TOC Initial Note (Signed)
Transition of Care [facility]) - Initial/Assessment Note    Patient Details  Name: Jocelyn Sanchez MRN: [id number] Date of Birth: January 30, 1956  Transition of Care [facility]) CM/SW Contact:    Janae Bridgeman, RN Phone Number: 11/20/2022, 12:01 PM  Clinical Narrative:                 CM met with the patient and family, including sister, Stanton Kidney and other family member present from [place] at the bedside.  The patient was admitted for seizures and is currently alert and was able to eat some breakfast at the bedside.  The patient was recently at [facility] for a short period of time for rehab and returned home with home health with [facility] RN for chronic wound care to sacrum and CAP Services through home health agency for 52 hours per week.  Palliative Care continues to follow the patient for needs.  Patient has DME at the home at this time including hospital bed, Hoyer lifts, WC.  Once patient is medically stable to discharge - patient may likely return home for care and resumption of services.  The patient is no longer on comfort care orders.  When patient is medically stable - will likely need PTAR to home.  Expected Discharge Plan: Home w Home Health Services Barriers to Discharge: Continued Medical Work up   Patient Goals and CMS Choice Patient states their goals for this hospitalization and ongoing recovery are:: Patient unable to states goals CMS Medicare.gov Compare Post Acute Care list provided to:: Patient Represenative (must comment) (sister, Stanton Kidney is present at the bedside)   [place] ownership interest in [facility].provided to:: Adult Children    Expected Discharge Plan and Services   Discharge Planning Services: CM Consult Post Acute Care Choice: Resumption of Svcs/PTA Provider Living arrangements for the past 2 months: Single Family Home                             HH Agency: [facility]     Representative spoke with at [facility] Agency: Patient  is currently active with [facility] health for RN for wound care for chronic sacral wound  Prior Living Arrangements/Services Living arrangements for the past 2 months: Single Family Home Lives with:: Relatives (Lives with mother, Olene Craven at the home) Patient language and need for interpreter reviewed:: Yes Do you feel safe going back to the place where you live?: Yes      Need for Family Participation in Patient Care: Yes (Comment) Care giver support system in place?: Yes (comment) Current home services: DME, Other (comment) (DME at the home includes hospital bed, Hoyer lifts, WC, CAP Services provide 52 hours/weel home health aide care at the home.  [facility] Home health is active for [facility] RN) Criminal Activity/Legal Involvement Pertinent to Current Situation/Hospitalization: No - Comment as needed  Activities of Daily Living      Permission Sought/Granted Permission sought to share information with : Family Supports Permission granted to share information with : Yes, Verbal Permission Granted     Permission granted to share info w AGENCY: Active with [facility] HH for RN, CAP patient receiving HH aide assistance at the home 52 hours/week        Emotional Assessment Appearance:: Appears stated age Attitude/Demeanor/Rapport: Gracious Affect (typically observed): Accepting Orientation: :  (unable to assess - patient nods and smiles in room at this time) Alcohol / Substance Use: Not Applicable Psych Involvement: No (  comment)  Admission diagnosis:  Cerebral edema (HCC) [G93.6] Hypomagnesemia [E83.42] Seizure (HCC) [R56.9] Obtundation [R40.1] Metabolic acidosis [E87.20] Patient Active Problem List   Diagnosis Date Noted   Seizure (HCC) 11/16/2022   Anemia 11/08/2022   Sacral wound 11/08/2022   Hypotension 11/08/2022   Gastric wall thickening 11/08/2022   H/O renal calculi 11/08/2022   Constipation 11/08/2022   Hyponatremia 11/08/2022   Pressure injury of skin 08/09/2020    Chronic combined systolic (congestive) and diastolic (congestive) heart failure (HCC) 08/09/2020   COVID-19 virus infection 08/06/2020   Acute blood loss anemia 08/06/2020   Sepsis (HCC) 07/26/2019   Metabolic acidosis    Diabetic acidosis without coma (HCC)    GI bleed 02/10/2019   Proteus mirabilis infection 08/14/2017   Diarrhea 08/14/2017   Bacteremia 08/14/2017   Gastric and duodenal angiodysplasia    Esophageal stricture    Gastroesophageal reflux disease with esophagitis    Hx of pulmonary embolus 10/26/2015   PVD (peripheral vascular disease) (HCC) 07/04/2015   Essential hypertension, benign 06/17/2015   Decubitus ulcer of ankle, stage 4 (HCC) 05/10/2015   Fever 03/08/2015   Septic shock (HCC) 03/06/2015   Diabetes mellitus with peripheral vascular disease (HCC) 03/06/2015   Leukocytosis 03/06/2015   Anemia of chronic disease 03/06/2015   Pressure injury of sacral region, stage 3 (HCC) 03/06/2015   Lymphedema 03/06/2015   Sepsis with acute renal failure (HCC) 03/01/2015   AKI (acute kidney injury) (HCC) 03/01/2015   Urinary tract infection with hematuria 11/28/2014   Multiple sclerosis (HCC) 05/11/2011   PCP:  Bethanie Dicker, FNP Pharmacy:   [id number] - [place], [place] - [address] AT [facility] [address] [place] [address] Phone: [phone] Fax: [phone]     Social Determinants of Health (SDOH) Social History: SDOH Screenings   Housing: Patient Unable To Answer (11/09/2022)  Tobacco Use: Medium Risk (11/13/2022)   SDOH Interventions:     Readmission Risk Interventions    11/20/2022   12:01 PM 11/14/2022    2:15 PM  Readmission Risk Prevention Plan  Transportation Screening Complete Complete  PCP or Specialist Appt within 5-7 Days Complete Complete  Home Care Screening Complete Complete  Medication Review (RN CM) Complete Complete

## 2022-11-20 NOTE — Consult Note (Signed)
Palliative Medicine Inpatient Consult Note  Consulting Provider:  Dr. Pola Corn  Reason for consult:   Palliative Care Consult Services Palliative Medicine Consult  Reason for Consult? GOC   11/20/2022  HPI:  Per intake H&P --> Jocelyn Sanchez is a 67 y.o. female with PMH significant for multiple sclerosis with quadriplegia/bedbound status, chronic sacral ulcer, prior PE on Eliquis, anemia, DM2, hypertension, lymphedema.   Palliative care has been asked to get involved to support further goals of care conversations in the setting of multiple chronic comorbid conditions and new onset seizures.   Clinical Assessment/Goals of Care:  *Please note that this is a verbal dictation therefore any spelling or grammatical errors are due to the "Dragon Medical One" system interpretation.  I have reviewed medical records including EPIC notes, labs and imaging, received report from bedside RN, assessed the patient who is lying in bed uncomfortable in appearance and requesting "water" .    I met with patients son,  Jocelyn Sanchez to further discuss diagnosis prognosis, GOC, EOL wishes, disposition and options.   I introduced Palliative Medicine as specialized medical care for people living with serious illness. It focuses on providing relief from the symptoms and stress of a serious illness. The goal is to improve quality of life for both the patient and the family.  Medical History Review and Understanding:  A review of Jocelyn Sanchez's PMH inclusive of her multiple sclerosis with quadriplegia/bedbound status, chronic sacral ulcer, prior PE on Eliquis, anemia, DM2, hypertension, lymphedema.  Social History:  We reviewed that Jocelyn Sanchez was born and raised in [place], [place]. She has three children. She is not married. 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 She lover traveling prior to her MS getting to the point whereby she is not mobile. She is a woman of extreme faith and practices within  Christianity.   Functional and Nutritional State:  Preceding hospitalization Jocelyn Sanchez was bed-bound. She lives with her son, Jocelyn Sanchez and mother. She required help with all bADLS and iADLs. She  was eating very well preceding hospitalization.   Advance Directives:  A detailed discussion was had today regarding advanced directives.  Patient does not have advance directives though would refer to her son, Jocelyn Sanchez who is her eldest.   Code Status:  Concepts specific to code status, artifical feeding and hydration, continued IV antibiotics and rehospitalization was had.  The difference between a aggressive medical intervention path  and a palliative comfort care path for this patient at this time was had.   Patients son agrees that a DNAR/DNI remains appropriate for Jocelyn Sanchez.   Discussion:  Discussions were held regarding patient's acute hospitalization in the setting of seizures.  Her son does express that she had never had seizures before however she was previously being treated for urinary infection so he wonders if perhaps the infection or the antibiotics that she was receiving perhaps contributed.  We discussed the inflammatory response that is seizure can cause in the brain and the swelling that may occur.  We reviewed that Jocelyn Sanchez may not go back to her prior level of baseline however may continue to gradually improve over the oncoming days.  Patient's son attests that she is leaps and bounds better today than she was yesterday as well as the day prior to that.  He expresses that he is hopeful she will continue to improve and that he is able to get her home with her previous level of care through private caregivers.  At this time patient's family would  like to give her a few more days to see if further improvements can be made.  Patient's family per son Jocelyn Sanchez would be agreeable to having a formal meeting on Wednesday at 1 PM as this would give them enough time to assess her improvements and/or  declines.  We did discuss hospice as an option if she is to go back home. I described hospice as a service for patients who have a life expectancy of 6 months or less. The goal of hospice is the preservation of dignity and quality at the end phases of life. Under hospice care, the focus changes from curative to symptom relief.   Discussed the importance of continued conversation with family and their  medical providers regarding overall plan of care and treatment options, ensuring decisions are within the context of the patients values and GOCs.  Decision Maker: Jocelyn Sanchez, Jocelyn Sanchez   [phone]    SUMMARY OF RECOMMENDATIONS   DNAR/DNI  Allow time for outcomes  Patients family to meet on Wednesday at 1300  Ongoing PMT support  Code Status/Advance Care Planning: DNAR/DNI  Palliative Prophylaxis:  Aspiration, Bowel Regimen, Delirium Protocol, Frequent Pain Assessment, Oral Care, Palliative Wound Care, and Turn Reposition  Additional Recommendations (Limitations, Scope, Preferences): Continue current care allowing time for outcomes  Psycho-social/Spiritual:  Desire for further Chaplaincy support: Yes - strong Christian Additional Recommendations: Education on status epilepticus/postictal state   Prognosis: Limited overall in the setting of multiple chronic comorbidities, 2 hospitalizations within 6 months, s/p status epilepticus  Discharge Planning: Discharge plan is currently uncertain  Vitals:   11/19/22 1658 11/19/22 2145  BP: 118/70 (!) 134/90  Pulse: (!) 109 (!) 115  Resp: 18 18  Temp: 98.9 F (37.2 C)   SpO2: 98% 96%    Intake/Output Summary (Last 24 hours) at 11/20/2022 0645 Last data filed at 11/20/2022 0100 Gross per 24 hour  Intake 234.79 ml  Output 850 ml  Net -615.21 ml   Last Weight  Most recent update: 11/17/2022  4:21 AM    Weight  57.1 kg (125 lb 14.1 oz)            Gen: Elderly chronically ill-appearing African-American female HEENT: moist mucous  membranes CV: Irregular rate and rhythm  PULM: On RA, breathing is even and nonlabored ABD: soft/nontender EXT: (+) edema BLE and BLUE Neuro: Alert to self  PPS: 20%   This conversation/these recommendations were discussed with patient primary care team, Dr. Pola Corn   Total Time: 86 Billing based on MDM: High Problems Addressed: One acute or chronic illness or injury that poses a threat to life or bodily function  Amount and/or Complexity of Data: Category 3:Discussion of management or test interpretation with external physician/other qualified health care professional/appropriate source (not separately reported)  Risks: Decision not to resuscitate or to de-escalate care because of poor prognosis ______________________________________________________ Jocelyn Sanchez Palliative Medicine Team Team Cell Phone: [phone] Please utilize secure chat with additional questions, if there is no response within 30 minutes please call the above phone number  Palliative Medicine Team providers are available by phone from 7am to 7pm daily and can be reached through the team cell phone.  Should this patient require assistance outside of these hours, please call the patient's attending physician.

## 2022-11-21 DIAGNOSIS — R569 Unspecified convulsions: Secondary | ICD-10-CM | POA: Diagnosis not present

## 2022-11-21 LAB — CULTURE, BLOOD (ROUTINE X 2)

## 2022-11-21 MED ORDER — MUPIROCIN 2 % EX OINT
1.0000 | TOPICAL_OINTMENT | Freq: Two times a day (BID) | CUTANEOUS | Status: AC
  Administered 2022-11-21 – 2022-11-25 (×10): 1 via NASAL
  Filled 2022-11-21 (×3): qty 22

## 2022-11-21 NOTE — Progress Notes (Addendum)
Speech Language Pathology Treatment: Dysphagia  Patient Details Name: KIASHA MELANDER MRN: [id number] DOB: 01-01-1956 Today's Date: 11/21/2022 Time: 7829-5621 SLP Time Calculation (min) (ACUTE ONLY): 14 min  Assessment / Plan / Recommendation Clinical Impression  Pt awake and seen for dysphagia intervention with family at bedside who state she ate some of her breakfast without difficulty. Pt repositioned upright and observed with applesauce, cracker and thin liquids. There were no signs aspiration seen across consistencies. Mildly prolonged mastication with cracker although able to clear oral cavity. Mild audible congestion with attempts at cough. She is at risk for aspiration given overall weak condition , history of MS and inability to produce effective cough. Discussed with family at bedside ,"Deb" and need for upright position, ensure she is alert enough to eat. SLP and family agreed that puree (Dys 1) diet is appropriate for her and that higher texture may lead to fatigue during meals or oral holding. Continue thin liquids, crush pills. Pt will need to stay on puree for duration of hospitalization; education complete with family. ST will sign off and pt can be assessed at next venue of care for potential for texture upgrade. Palliative care is following pt and will have family meeting 6/5.   HPI HPI: 67 year old female past medical history of multiple sclerosis, quadriplegic bedbound chronic sacral ulcer hypertension lymphedema prior pulmonary embolism.  Admitted with concern for status elipticus. BSE 2021 presented with gagging with solids and suspected GI issues contributing to gagging. Pt preferred full liquid diet.      SLP Plan  All goals met;Discharge SLP treatment due to (comment)      Recommendations for follow up therapy are one component of a multi-disciplinary discharge planning process, led by the attending physician.  Recommendations may be updated based on patient status,  additional functional criteria and insurance authorization.    Recommendations  Diet recommendations: Dysphagia 1 (puree);Thin liquid Liquids provided via: Straw Medication Administration: Crushed with puree Supervision: Full supervision/cueing for compensatory strategies;Trained caregiver to feed patient Compensations: Slow rate;Small sips/bites Postural Changes and/or Swallow Maneuvers: Seated upright 90 degrees                  Oral care BID   Frequent or constant Supervision/Assistance Dysphagia, unspecified (R13.10)     All goals met;Discharge SLP treatment due to (comment)     Royce Macadamia  11/21/2022, 10:16 AM

## 2022-11-21 NOTE — Progress Notes (Signed)
PROGRESS NOTE  Jocelyn Sanchez  DOB: 01-Jun-1956  PCP: Bethanie Dicker, FNP [id number]  DOA: 11/16/2022  LOS: 5 days  Hospital Day: 6  Brief narrative: Jocelyn Sanchez is a 67 y.o. female with PMH significant for multiple sclerosis with quadriplegia/bedbound status, chronic sacral ulcer, prior PE on Eliquis, anemia, DM2, hypertension, lymphedema.  5/30, patient was brought to the ED by EMS with seizures. No history of seizure,.  Family noted patient was unwell, not responding and foaming from her mouth.. EMS noted rhythmic shaking of all extremities as well as horizontal rhythmic moving of eyes.  She was given IM Versed.  Remained tachycardic and had notable pinpoint pupil.  Narcan was given without improvement.  Patient was given BMV ventilation and brought to the ED.   DNR  CT head with asymmetric edema in the left cerebral hemisphere possibly secondary to postictal state, but early large left MCA territory infarct is also possible.  Started on Keppra.  Neurology consulted Admitted to ICU for status epilepticus 5/31, patient had prolonged seizure and EEG.   5/31, PCCM Dr. Tonia Brooms had an extensive bedside discussion with patient's family regarding her current circumstances, poor prognosis and poor quality of life.  Family made a choice to transition her to full comfort care Transferred to [facility]  Subjective: Patient was seen and examined this morning. Lying on bed.  Not in distress.  Opens eyes and mumbles.  Family states that she was able to verbalize their name earlier.  They are hopeful of a meaningful recovery. Post discharge, they are expecting her to be in a rehab if approved  Assessment and plan: New onset seizure Status epilepticus Primarily admitted for seizure.   AEDs to continue to prevent further seizures Currently on Keppra, Vimpat and as needed Versed IV.  Once oral intake is ensured, I will switch her to oral AEDs  Acute metabolic encephalopathy Family states he had  baseline normal cognitive function.   Patient had prolonged seizure leading to prolonged postictal state and altered mental status.  Family has noticed gradual improvement and is hopeful.  However on my exam, patient is able to open eyes on command, only mumbles but unable to have a meaningful conversation.  Family still hopes for a meaningful recovery.  Palliative care consult appreciated.  Another family meeting planned for Wednesday 6/5.   Multiple sclerosis with quadriplegia/bedbound status Chronic sacral ulcer with hx of quadriplegia (POA) Continue supportive care Local would care  Pressure alleviating devices    SIRS hx of recent ESBL UTI 5/22, treated with Zosyn) 6/3, patient was tachycardic, WBC count was elevated.  Urinalysis showed turbid yellow urine with positive nitrate, large hemoglobin, moderate leukocytes and many bacteria  Urine culture sent.  Blood culture sent.  Patient was started on IV Zosyn. No fever. Repeat labs tomorrow. Recent Labs  Lab 11/16/22 1305 11/19/22 0422 11/20/22 0842 11/20/22 1404  WBC 18.6* 19.6* 24.8*  --   LATICACIDVEN 1.3  --   --  1.4   Hypokalemia Potassium level improved with replacement. Recent Labs  Lab 11/16/22 1305 11/16/22 1332 11/19/22 0417 11/19/22 0422 11/20/22 0842  K 3.6 3.1*  --  2.9* 3.8  MG 1.5*  --  2.1  --   --    H/o combined systolic and diastolic CHF Essential hypertension PTA on Coreg, Entresto But have been resumed.   Prior pulmonary embolism  Eliquis resumed.  Type 2 diabetes SSI/Accu-Cheks  Pressure ulcers - POA Pressure Injury 11/09/22 Heel Left Deep Tissue Pressure Injury -  Purple or maroon localized area of discolored intact skin or blood-filled blister due to damage of underlying soft tissue from pressure and/or shear. 2 cm x 3 cm (Active)  11/09/22 0700  Location: Heel  Location Orientation: Left  Staging: Deep Tissue Pressure Injury - Purple or maroon localized area of discolored intact skin  or blood-filled blister due to damage of underlying soft tissue from pressure and/or shear.  Wound Description (Comments): 2 cm x 3 cm  Present on Admission:      Pressure Injury 11/09/22 Buttocks Right Stage 3 -  Full thickness tissue loss. Subcutaneous fat may be visible but bone, tendon or muscle are NOT exposed. 2 cm x 3 cm x 1 cm w/1.4 cm undermining from 12-3 o'clock (Active)  11/09/22 0700  Location: Buttocks  Location Orientation: Right  Staging: Stage 3 -  Full thickness tissue loss. Subcutaneous fat may be visible but bone, tendon or muscle are NOT exposed.  Wound Description (Comments): 2 cm x 3 cm x 1 cm w/1.4 cm undermining from 12-3 o'clock  Present on Admission: Yes     Pressure Injury 11/16/22 Elbow Anterior;Left Wound from arm contracture to elbow crevice, stage 2/ skin tear (Active)  11/16/22 2000  Location: Elbow  Location Orientation: Anterior;Left  Staging:   Wound Description (Comments): Wound from arm contracture to elbow crevice, stage 2/ skin tear  Present on Admission: Yes     Mobility: Essentially bedbound  Goals of care   Code Status: DNR.  Palliative care to revisit on Wednesday.   DVT prophylaxis: Start heparin subcu apixaban (ELIQUIS) tablet 5 mg   Antimicrobials: Empiric IV Zosyn Fluid: Oral hydration encouraged Consultants: Palliative care consulted Family Communication: Sister at bedside  Status: Inpatient Level of care:  Palliative Care   Patient from: Home Anticipated d/c to: Pending clinical course Needs to continue in-hospital care:  Gradually improving mental status.  Not sure if there will be a meaningful recovery.  Family hopes for that.  Monitor for next 24 to 48 hours. Family expects discharge to rehab.  If not improving at least expanded hours Home with services.  PT eval ordered.    Diet:  Diet Order             DIET - DYS 1 Room service appropriate? No; Fluid consistency: Thin  Diet effective now                    Scheduled Meds:  apixaban  5 mg Oral BID   carvedilol  3.125 mg Oral BID WC   mupirocin ointment  1 Application Nasal BID   sacubitril-valsartan  1 tablet Oral BID    PRN meds: acetaminophen **OR** acetaminophen, midazolam, polyvinyl alcohol   Infusions:   lacosamide (VIMPAT) IV 50 mg (11/21/22 1038)   levETIRAcetam 1,500 mg (11/21/22 1110)   piperacillin-tazobactam (ZOSYN)  IV 3.375 g (11/21/22 0503)    Antimicrobials: Anti-infectives (From admission, onward)    Start     Dose/Rate Route Frequency Ordered Stop   11/20/22 1400  piperacillin-tazobactam (ZOSYN) IVPB 3.375 g        3.375 g 12.5 mL/hr over 240 Minutes Intravenous Every 8 hours 11/20/22 1251         Nutritional status:  Body mass index is 22.3 kg/m.          Objective: Vitals:   11/21/22 0500 11/21/22 0758  BP: 97/66 108/67  Pulse: 100 93  Resp: 18   Temp: 98.1 F (36.7 C)   SpO2:  100% 100%    Intake/Output Summary (Last 24 hours) at 11/21/2022 1417 Last data filed at 11/21/2022 0503 Gross per 24 hour  Intake 260.06 ml  Output 1150 ml  Net -889.94 ml   Filed Weights   11/16/22 1911 11/17/22 0420  Weight: 57.1 kg 57.1 kg   Weight change:  Body mass index is 22.3 kg/m.   Physical Exam: General exam: Pleasant elderly African-American female.  Not in physical distress.   Skin: No rashes, lesions or ulcers. HEENT: Atraumatic, normocephalic, no obvious bleeding Lungs: Clear to auscultation bilaterally CVS: Regular rate and rhythm, no murmur GI/Abd soft, nontender, nondistended, bowel sound present CNS: Opens eyes on command, mumbling, unable to answer orientation questions.  Quadriplegic at baseline due to MS Psychiatry: Sad affect Extremities: No pedal edema, no calf tenderness  Data Review: I have personally reviewed the laboratory data and studies available.  F/u labs ordered Unresulted Labs (From admission, onward)     Start     Ordered   11/22/22 0500  Basic metabolic panel   Tomorrow morning,   R       Question:  Specimen collection method  Answer:  Lab=Lab collect   11/21/22 0813   11/22/22 0500  CBC with Differential/Platelet  Tomorrow morning,   R       Question:  Specimen collection method  Answer:  Lab=Lab collect   11/21/22 0813   11/20/22 1150  Culture, blood (Routine X 2) w Reflex to ID Panel  BLOOD CULTURE X 2,   R      11/20/22 1150           Total time spent in review of labs and imaging, patient evaluation, formulation of plan, documentation and communication with family: 45 minutes  Signed, Lorin Glass, MD [facility] 11/21/2022

## 2022-11-22 DIAGNOSIS — G9341 Metabolic encephalopathy: Secondary | ICD-10-CM | POA: Diagnosis present

## 2022-11-22 DIAGNOSIS — G35 Multiple sclerosis: Secondary | ICD-10-CM

## 2022-11-22 DIAGNOSIS — R627 Adult failure to thrive: Secondary | ICD-10-CM

## 2022-11-22 DIAGNOSIS — R1319 Other dysphagia: Secondary | ICD-10-CM

## 2022-11-22 DIAGNOSIS — R569 Unspecified convulsions: Secondary | ICD-10-CM | POA: Diagnosis not present

## 2022-11-22 LAB — CBC WITH DIFFERENTIAL/PLATELET
Abs Immature Granulocytes: 0.09 10*3/uL — ABNORMAL HIGH (ref 0.00–0.07)
Basophils Absolute: 0.2 10*3/uL — ABNORMAL HIGH (ref 0.0–0.1)
Basophils Relative: 1 %
Eosinophils Absolute: 0.7 10*3/uL — ABNORMAL HIGH (ref 0.0–0.5)
Eosinophils Relative: 4 %
HCT: 40.3 % (ref 36.0–46.0)
Hemoglobin: 12.6 g/dL (ref 12.0–15.0)
Immature Granulocytes: 1 %
Lymphocytes Relative: 8 %
Lymphs Abs: 1.3 10*3/uL (ref 0.7–4.0)
MCH: 25.9 pg — ABNORMAL LOW (ref 26.0–34.0)
MCHC: 31.3 g/dL (ref 30.0–36.0)
MCV: 82.8 fL (ref 80.0–100.0)
Monocytes Absolute: 0.8 10*3/uL (ref 0.1–1.0)
Monocytes Relative: 5 %
Neutro Abs: 13.6 10*3/uL — ABNORMAL HIGH (ref 1.7–7.7)
Neutrophils Relative %: 81 %
Platelets: 367 10*3/uL (ref 150–400)
RBC: 4.87 MIL/uL (ref 3.87–5.11)
RDW: 26.4 % — ABNORMAL HIGH (ref 11.5–15.5)
WBC: 16.6 10*3/uL — ABNORMAL HIGH (ref 4.0–10.5)
nRBC: 0 % (ref 0.0–0.2)

## 2022-11-22 LAB — BASIC METABOLIC PANEL
Anion gap: 10 (ref 5–15)
BUN: 14 mg/dL (ref 8–23)
CO2: 15 mmol/L — ABNORMAL LOW (ref 22–32)
Calcium: 8.3 mg/dL — ABNORMAL LOW (ref 8.9–10.3)
Chloride: 116 mmol/L — ABNORMAL HIGH (ref 98–111)
Creatinine, Ser: 0.75 mg/dL (ref 0.44–1.00)
GFR, Estimated: >60 mL/min (ref >60)
Glucose, Bld: 120 mg/dL — ABNORMAL HIGH (ref 70–99)
Potassium: 2.4 mmol/L — CL (ref 3.5–5.1)
Sodium: 141 mmol/L (ref 135–145)

## 2022-11-22 LAB — GLUCOSE, CAPILLARY
Glucose-Capillary: 118 mg/dL — ABNORMAL HIGH (ref 70–99)
Glucose-Capillary: 127 mg/dL — ABNORMAL HIGH (ref 70–99)
Glucose-Capillary: 156 mg/dL — ABNORMAL HIGH (ref 70–99)

## 2022-11-22 LAB — HEMOGLOBIN A1C
Hgb A1c MFr Bld: 5.2 % (ref 4.8–5.6)
Mean Plasma Glucose: 103 mg/dL

## 2022-11-22 LAB — CULTURE, BLOOD (ROUTINE X 2)

## 2022-11-22 MED ORDER — POTASSIUM CHLORIDE CRYS ER 20 MEQ PO TBCR
40.0000 meq | EXTENDED_RELEASE_TABLET | ORAL | Status: AC
  Administered 2022-11-22 (×4): 40 meq via ORAL
  Filled 2022-11-22 (×4): qty 2

## 2022-11-22 MED ORDER — INSULIN ASPART 100 UNIT/ML IJ SOLN: 0.0000 [IU] | Freq: Every day | INTRAMUSCULAR | Status: DC

## 2022-11-22 MED ORDER — INSULIN ASPART 100 UNIT/ML IJ SOLN
0.0000 [IU] | Freq: Three times a day (TID) | INTRAMUSCULAR | Status: DC
  Administered 2022-11-22 – 2022-11-23 (×3): 1 [IU] via SUBCUTANEOUS
  Administered 2022-11-24: 2 [IU] via SUBCUTANEOUS
  Administered 2022-11-24: 1 [IU] via SUBCUTANEOUS
  Administered 2022-11-25: 2 [IU] via SUBCUTANEOUS
  Administered 2022-11-26 – 2022-11-29 (×5): 1 [IU] via SUBCUTANEOUS
  Administered 2022-11-29: 2 [IU] via SUBCUTANEOUS
  Administered 2022-11-30 – 2022-12-01 (×2): 1 [IU] via SUBCUTANEOUS
  Administered 2022-12-02 – 2022-12-03 (×3): 2 [IU] via SUBCUTANEOUS
  Administered 2022-12-04: 1 [IU] via SUBCUTANEOUS

## 2022-11-22 NOTE — TOC Progression Note (Signed)
Transition of Care [facility]) - Progression Note    Patient Details  Name: LAURNA PLAZA MRN: [id number] Date of Birth: 1955/12/03  [facility]) CM/SW Contact  Giuliano Preece A [place], [place] Phone Number: 11/22/2022, 4:59 PM  Clinical Narrative:     CSW contacted pt's mother, Bobiette regarding request for SNF. She stated that would be acceptable and requested CSW follow up with pt's son, Francee Piccolo as well.  CSW contacted pt's son, Francee Piccolo, he stated he was agreeable to SNF referral.   CSW completed SNF workup.   TOC will continue to follow   Expected Discharge Plan: Home w Home Health Services Barriers to Discharge: Continued Medical Work up  Expected Discharge Plan and Services   Discharge Planning Services: CM Consult Post Acute Care Choice: Resumption of Svcs/PTA Provider Living arrangements for the past 2 months: Single Family Home                             HH Agency: [facility]     Representative spoke with at [facility] Agency: Patient is currently active with [facility] health for RN for wound care for chronic sacral wound   Social Determinants of Health (SDOH) Interventions SDOH Screenings   Housing: Patient Unable To Answer (11/09/2022)  Tobacco Use: Medium Risk (11/13/2022)    Readmission Risk Interventions    11/20/2022   12:01 PM 11/14/2022    2:15 PM  Readmission Risk Prevention Plan  Transportation Screening Complete Complete  PCP or Specialist Appt within 5-7 Days Complete Complete  Home Care Screening Complete Complete  Medication Review (RN CM) Complete Complete

## 2022-11-22 NOTE — TOC Progression Note (Addendum)
Transition of Care [facility]) - Progression Note    Patient Details  Name: Jocelyn Sanchez MRN: [id number] Date of Birth: Jul 02, 1955  Transition of Care [facility]) CM/SW Contact  Lonna Rabold A [place], [place] Phone Number: 11/22/2022, 5:37 PM  Clinical Narrative:     CSW received phone call from [place], pt's sister regarding voicemail. She informed CSW that pt's mother was confused by conversation CSW had with her earlier. CSW stated that CSW was calling just to inform her that SNF workup was to be completed and ask for any facility preferences.  She acknowledged understanding and  followed up with CSW that pt's family had a preference for the following facilities; [facility], [place], [facility], [place]. She also stated that Errick, pt's son, would be the best point of contact, [phone].   TOC will continue to follow.   CSW was notified that pt's mother was confused about earlier phone conversation. CSW contacted Stanton Kidney, [phone] as requested to clear up any miscommunication. There was no answer. CSW left voicemail and contact information to follow up with CSW if needed.   TOC will continue to follow.   Expected Discharge Plan: Home w Home Health Services Barriers to Discharge: Continued Medical Work up  Expected Discharge Plan and Services   Discharge Planning Services: CM Consult Post Acute Care Choice: Resumption of Svcs/PTA Provider Living arrangements for the past 2 months: Single Family Home                             HH Agency: [facility]     Representative spoke with at [facility] Agency: Patient is currently active with [facility] health for RN for wound care for chronic sacral wound   Social Determinants of Health (SDOH) Interventions SDOH Screenings   Housing: Patient Unable To Answer (11/09/2022)  Tobacco Use: Medium Risk (11/13/2022)    Readmission Risk Interventions    11/20/2022   12:01 PM 11/14/2022    2:15 PM  Readmission Risk Prevention Plan   Transportation Screening Complete Complete  PCP or Specialist Appt within 5-7 Days Complete Complete  Home Care Screening Complete Complete  Medication Review (RN CM) Complete Complete

## 2022-11-22 NOTE — NC FL2 (Signed)
[place] MEDICAID FL2 LEVEL OF CARE FORM     IDENTIFICATION  [name] Birthdate: Jul 24, 1955 Sex: female Admission Date (Current Location): 11/16/2022  [place] and [place] Number:  Haynes Bast [id number] Facility and Address:  [facility] [place], [address], [place], [place] [address]      Provider Number: [id number]  Attending Physician Name and Address:  Hughie Closs, MD  Relative Name and Phone Number:  Trish, Hetu [phone])  [phone]    Current Level of Care: Hospital Recommended Level of Care: Skilled Nursing Facility Prior Approval Number:    Date Approved/Denied:   PASRR Number: [id number]  Discharge Plan: SNF    Current Diagnoses: Patient Active Problem List   Diagnosis Date Noted   Acute metabolic encephalopathy 11/22/2022   Seizure (HCC) 11/16/2022   Anemia 11/08/2022   Sacral wound 11/08/2022   Hypotension 11/08/2022   Gastric wall thickening 11/08/2022   H/O renal calculi 11/08/2022   Constipation 11/08/2022   Hyponatremia 11/08/2022   Pressure injury of skin 08/09/2020   Chronic combined systolic (congestive) and diastolic (congestive) heart failure (HCC) 08/09/2020   COVID-19 virus infection 08/06/2020   Acute blood loss anemia 08/06/2020   Sepsis (HCC) 07/26/2019   Metabolic acidosis    Diabetic acidosis without coma (HCC)    GI bleed 02/10/2019   Proteus mirabilis infection 08/14/2017   Diarrhea 08/14/2017   Bacteremia 08/14/2017   Gastric and duodenal angiodysplasia    Esophageal stricture    Gastroesophageal reflux disease with esophagitis    Hx of pulmonary embolus 10/26/2015   PVD (peripheral vascular disease) (HCC) 07/04/2015   Essential hypertension, benign 06/17/2015   Decubitus ulcer of ankle, stage 4 (HCC) 05/10/2015   Fever 03/08/2015   Septic shock (HCC) 03/06/2015   Diabetes mellitus with peripheral vascular disease (HCC) 03/06/2015   Leukocytosis 03/06/2015   Anemia of chronic  disease 03/06/2015   Pressure injury of sacral region, stage 3 (HCC) 03/06/2015   Lymphedema 03/06/2015   Sepsis with acute renal failure (HCC) 03/01/2015   AKI (acute kidney injury) (HCC) 03/01/2015   Urinary tract infection with hematuria 11/28/2014   Multiple sclerosis (HCC) 05/11/2011    Orientation RESPIRATION BLADDER Height & Weight     Self  Normal Incontinent, External catheter Weight: 125 lb 14.1 oz (57.1 kg) Height:     BEHAVIORAL SYMPTOMS/MOOD NEUROLOGICAL BOWEL NUTRITION STATUS      Incontinent Diet (see discharge summary)  AMBULATORY STATUS COMMUNICATION OF NEEDS Skin   Total Care Verbally (delayed responses, incomprehensible) PU Stage and Appropriate Care, Other (Comment) (1.skin tear, stage left elbow. 2. right buttocks, stage 3, full thickness tissue loss. 3. Left Heel, deep tissue pressure injury, blood filled blister)                       Personal Care Assistance Level of Assistance  Bathing, Feeding, Dressing Bathing Assistance: Maximum assistance Feeding assistance: Limited assistance Dressing Assistance: Maximum assistance     Functional Limitations Info  Sight, Hearing, Speech Sight Info: Impaired (wears glasses) Hearing Info: Adequate Speech Info: Adequate    SPECIAL CARE FACTORS FREQUENCY                       Contractures      Additional Factors Info  Code Status, Allergies, Isolation Precautions Code Status Info: DNR Allergies Info: Sulfa Antibiotics  Chlorhexidine     Isolation Precautions Info: MRSA     Current Medications (11/22/2022):  This is the current hospital active medication list Current Facility-Administered Medications  Medication Dose Route Frequency Provider Last Rate Last Admin   acetaminophen (TYLENOL) tablet 650 mg  650 mg Oral Q6H PRN Desai, Rahul P, PA-C       Or   acetaminophen (TYLENOL) suppository 650 mg  650 mg Rectal Q6H PRN Desai, Rahul P, PA-C       apixaban (ELIQUIS) tablet 5 mg  5 mg Oral BID Pham,  Minh Q, RPH-CPP   5 mg at 11/22/22 1141   carvedilol (COREG) tablet 3.125 mg  3.125 mg Oral BID WC Dahal, Melina Schools, MD   3.125 mg at 11/22/22 1541   insulin aspart (novoLOG) injection 0-5 Units  0-5 Units Subcutaneous QHS Pahwani, Ravi, MD       insulin aspart (novoLOG) injection 0-9 Units  0-9 Units Subcutaneous TID WC Pahwani, Daleen Bo, MD       lacosamide (VIMPAT) 50 mg in sodium chloride 0.9 % 25 mL IVPB  50 mg Intravenous Q12H Charlsie Quest, MD 60 mL/hr at 11/22/22 1139 50 mg at 11/22/22 1139   levETIRAcetam (KEPPRA) IVPB 1500 mg/ 100 mL premix  1,500 mg Intravenous Q12H Charlsie Quest, MD 400 mL/hr at 11/22/22 1252 1,500 mg at 11/22/22 1252   midazolam (VERSED) injection 2-4 mg  2-4 mg Intravenous Q4H PRN Desai, Rahul P, PA-C       mupirocin ointment (BACTROBAN) 2 % 1 Application  1 Application Nasal BID Dahal, Melina Schools, MD   1 Application at 11/22/22 1141   piperacillin-tazobactam (ZOSYN) IVPB 3.375 g  3.375 g Intravenous Q8H Pham, Minh Q, RPH-CPP 12.5 mL/hr at 11/22/22 1541 3.375 g at 11/22/22 1541   polyvinyl alcohol (LIQUIFILM TEARS) 1.4 % ophthalmic solution 1 drop  1 drop Both Eyes QID PRN Desai, Rahul P, PA-C       potassium chloride SA (KLOR-CON M) CR tablet 40 mEq  40 mEq Oral Q4H Pahwani, Ravi, MD   40 mEq at 11/22/22 1541   sacubitril-valsartan (ENTRESTO) 24-26 mg per tablet  1 tablet Oral BID Lorin Glass, MD   1 tablet at 11/22/22 1141     Discharge Medications: Please see discharge summary for a list of discharge medications.  Relevant Imaging Results:  Relevant Lab Results:   Additional Information SSN: [id number]. Hospice to follow at SNF  [place] A [place], [place]

## 2022-11-22 NOTE — Progress Notes (Signed)
Dressing to sacrum changed at this time.  Wound packed with 2 saline moistened 4x4's and covered with ABD pad.  Secured with medipore tape.  Tolerated well by patient

## 2022-11-22 NOTE — Progress Notes (Signed)
Patient ID: CHINENYE DONINI, female   DOB: 1955-08-15, 67 y.o.   MRN: [id number]    Progress Note from the Palliative Medicine Team at [facility]   Patient Name: SAPPHYRE MONASMITH        Date: 11/22/2022 DOB: 1956/04/06  Age: 67 y.o. MRN#: [id number] Attending Physician: Hughie Closs, MD Primary Care Physician: Bethanie Dicker, FNP Admit Date: 11/16/2022   Medical records reviewed    BRITTANE KILLE is a 67 y.o. female with PMH significant for multiple sclerosis with quadriplegia/bedbound status, chronic sacral ulcer, prior PE on Eliquis, anemia, DM2, hypertension, lymphedema.   Patient has had continued physical, functional and cognitive decline over the past many years 2/2 to MS disease progression.    Palliative care  asked to get involved to support further goals of care conversations in the setting of multiple chronic comorbid conditions and new onset seizures.    This NP assessed patient at the bedside as a follow up for palliative medicine needs and emotional support and to meet with family as scheduled for ongoing conversation regarding advanced directive decisions, treatment option decisions, and anticipation care needs.   Patient's three sons, a sister, a brother, a niece and a cousin are present for family meeting. Education offered on the natural trajectory and expectation the patient with a diagnosis of end-stage MS.  Education offered on the difference between a full medical support path and a palliative comfort path for this patient at this time in this situation.  Education offered on hospice benefit: philosophy and eligibility.  Family all are in agreement that focus of care should center on comfort and dignity allowing for natural death.  All express understanding that this is the patient's personal wishes as she expressed to her family.   Plan of care: -DNR/DNI -no artifical feeding or hydration now or in the future - comfort feeds as tolerated, family understand  know risk for aspiration -family is hopeful for SNF for long term care and Palliative services, not interested in hospice at his time -avoid re-hospitalizations  MOST form completed reflect above goals of care  Family mentions there choices as it refers to skilled nursing facilities; [facility], [facility], [facility]   Education offered today regarding  the importance of continued conversation with family and their  medical providers regarding overall plan of care and treatment options,  ensuring decisions are within the context of the patients values and GOCs.  Questions and concerns addressed   Discussed with Dr Jacqulyn Bath and [facility] team via secure chat   Tally Due is encouraged to call with questions or concerns.   Time: 75 minutes  Detailed review of medical records ( labs, imaging, vital signs), medically appropriate exam ( MS, skin, resp)   discussed with treatment team, counseling and education to patient, family, staff, documenting clinical information, medication management, coordination of care    Lorinda Creed NP  Palliative Medicine Team Team Phone # [phone] Pager [phone]

## 2022-11-22 NOTE — Progress Notes (Signed)
PROGRESS NOTE    Jocelyn Sanchez  UJW:[id number] DOB: 03-28-1956 DOA: 11/16/2022 PCP: Bethanie Dicker, FNP   Brief Narrative:  Jocelyn Sanchez is a 67 y.o. female with PMH significant for multiple sclerosis with quadriplegia/bedbound status, chronic sacral ulcer, prior PE on Eliquis, anemia, DM2, hypertension, lymphedema.   5/30, patient was brought to the ED by EMS with seizures. No history of seizure,.  Family noted patient was unwell, not responding and foaming from her mouth.. EMS noted rhythmic shaking of all extremities as well as horizontal rhythmic moving of eyes.  She was given IM Versed.  Remained tachycardic and had notable pinpoint pupil.  Narcan was given without improvement.  Patient was given BMV ventilation and brought to the ED.   DNR   CT head with asymmetric edema in the left cerebral hemisphere possibly secondary to postictal state, but early large left MCA territory infarct is also possible.  Started on Keppra.  Neurology consulted Admitted to ICU for status epilepticus 5/31, patient had prolonged seizure and EEG.   5/31, PCCM Dr. Tonia Brooms had an extensive bedside discussion with patient's family regarding her current circumstances, poor prognosis and poor quality of life.  Family made a choice to transition her to full comfort care Transferred to [facility]  Assessment & Plan:   Principal Problem:   Seizure [facility]) Active Problems:   Acute metabolic encephalopathy  New onset seizure Status epilepticus Primarily admitted for seizure.   AEDs to continue to prevent further seizures Currently on Keppra, Vimpat and as needed Versed IV.  Although she is on the diet but need to see more evidence of good oral intake before we switch to oral medications.    Acute metabolic encephalopathy Family states he had baseline normal cognitive function.   Patient had prolonged seizure leading to prolonged postictal state and altered mental status. Family has noticed gradual improvement  and is hopeful for meaningful recovery.  Per notes from previous hospitalist, she was only able to open eyes on commands but mumbles.  During my evaluation today, patient is able to speak, her speech is almost comprehensible but does not make any sense as she continues to talk.  Family meeting planned for today.   Multiple sclerosis with quadriplegia/bedbound status Chronic sacral ulcer with hx of quadriplegia (POA) Continue supportive care Local would care  Pressure alleviating devices    SIRS secondary to presumed UTI, patient with history of ESBL UTI 11/08/2022, treated with Zosyn. 6/3, patient was tachycardic, WBC count was elevated.  Urinalysis showed turbid yellow urine with positive nitrate, large hemoglobin, moderate leukocytes and many bacteria.  She was started on Zosyn for the treatment of UTI.  Urine culture is not sent.  I will order urine culture.  Hypokalemia Severely low.  Will aggressively replenished.  History of PE: Continue Eliquis.  H/o combined systolic and diastolic CHF/ Essential hypertension Stable.  Continue home medications which is Coreg and Entresto.     Type 2 diabetes Last hemoglobin A1c documented in the chart was 3 years ago which is 6.7.  I do not see any order for repeat hemoglobin A1c or SSI.  Will order both.  DVT prophylaxis: Eliquis   Code Status: DNR  Family Communication:  None present at bedside.  Family meeting planned with palliative care today.  Status is: Inpatient Remains inpatient appropriate because: Disposition unclear, family meeting planned.   Estimated body mass index is 22.3 kg/m as calculated from the following:   Height as of 11/08/22: 5\' 3"  (1.6 m).  Weight as of this encounter: 57.1 kg.  Pressure Injury 08/06/20 Sacrum Mid Stage III -  Full thickness tissue loss. Subcutaneous fat may be visible but bone, tendon or muscle are NOT exposed. healing , not coplately healed (Active)  08/06/20 2304  Location: Sacrum  Location  Orientation: Mid  Staging: Stage III -  Full thickness tissue loss. Subcutaneous fat may be visible but bone, tendon or muscle are NOT exposed.  Wound Description (Comments): healing , not coplately healed  Present on Admission: Yes     Pressure Injury 08/06/20 Heel Left Stage 2 -  Partial thickness loss of dermis presenting as a shallow open injury with a red, pink wound bed without slough. (Active)  08/06/20 2306  Location: Heel  Location Orientation: Left  Staging: Stage 2 -  Partial thickness loss of dermis presenting as a shallow open injury with a red, pink wound bed without slough.  Wound Description (Comments):   Present on Admission: Yes     Pressure Injury 11/09/22 Heel Left Deep Tissue Pressure Injury - Purple or maroon localized area of discolored intact skin or blood-filled blister due to damage of underlying soft tissue from pressure and/or shear. 2 cm x 3 cm (Active)  11/09/22 0700  Location: Heel  Location Orientation: Left  Staging: Deep Tissue Pressure Injury - Purple or maroon localized area of discolored intact skin or blood-filled blister due to damage of underlying soft tissue from pressure and/or shear.  Wound Description (Comments): 2 cm x 3 cm  Present on Admission:   Dressing Type Foam - Lift dressing to assess site every shift 11/21/22 0800     Pressure Injury 11/09/22 Buttocks Right Stage 3 -  Full thickness tissue loss. Subcutaneous fat may be visible but bone, tendon or muscle are NOT exposed. 2 cm x 3 cm x 1 cm w/1.4 cm undermining from 12-3 o'clock (Active)  11/09/22 0700  Location: Buttocks  Location Orientation: Right  Staging: Stage 3 -  Full thickness tissue loss. Subcutaneous fat may be visible but bone, tendon or muscle are NOT exposed.  Wound Description (Comments): 2 cm x 3 cm x 1 cm w/1.4 cm undermining from 12-3 o'clock  Present on Admission: Yes  Dressing Type Foam - Lift dressing to assess site every shift 11/21/22 0800     Pressure Injury  11/16/22 Elbow Anterior;Left Wound from arm contracture to elbow crevice, stage 2/ skin tear (Active)  11/16/22 2000  Location: Elbow  Location Orientation: Anterior;Left  Staging:   Wound Description (Comments): Wound from arm contracture to elbow crevice, stage 2/ skin tear  Present on Admission: Yes  Dressing Type None 11/21/22 0800   Nutritional Assessment: Body mass index is 22.3 kg/m.Marland Kitchen Seen by dietician.  I agree with the assessment and plan as outlined below: Nutrition Status:        . Skin Assessment: I have examined the patient's skin and I agree with the wound assessment as performed by the wound care RN as outlined below: Pressure Injury 08/06/20 Sacrum Mid Stage III -  Full thickness tissue loss. Subcutaneous fat may be visible but bone, tendon or muscle are NOT exposed. healing , not coplately healed (Active)  08/06/20 2304  Location: Sacrum  Location Orientation: Mid  Staging: Stage III -  Full thickness tissue loss. Subcutaneous fat may be visible but bone, tendon or muscle are NOT exposed.  Wound Description (Comments): healing , not coplately healed  Present on Admission: Yes     Pressure Injury 08/06/20 Heel Left  Stage 2 -  Partial thickness loss of dermis presenting as a shallow open injury with a red, pink wound bed without slough. (Active)  08/06/20 2306  Location: Heel  Location Orientation: Left  Staging: Stage 2 -  Partial thickness loss of dermis presenting as a shallow open injury with a red, pink wound bed without slough.  Wound Description (Comments):   Present on Admission: Yes     Pressure Injury 11/09/22 Heel Left Deep Tissue Pressure Injury - Purple or maroon localized area of discolored intact skin or blood-filled blister due to damage of underlying soft tissue from pressure and/or shear. 2 cm x 3 cm (Active)  11/09/22 0700  Location: Heel  Location Orientation: Left  Staging: Deep Tissue Pressure Injury - Purple or maroon localized area of  discolored intact skin or blood-filled blister due to damage of underlying soft tissue from pressure and/or shear.  Wound Description (Comments): 2 cm x 3 cm  Present on Admission:   Dressing Type Foam - Lift dressing to assess site every shift 11/21/22 0800     Pressure Injury 11/09/22 Buttocks Right Stage 3 -  Full thickness tissue loss. Subcutaneous fat may be visible but bone, tendon or muscle are NOT exposed. 2 cm x 3 cm x 1 cm w/1.4 cm undermining from 12-3 o'clock (Active)  11/09/22 0700  Location: Buttocks  Location Orientation: Right  Staging: Stage 3 -  Full thickness tissue loss. Subcutaneous fat may be visible but bone, tendon or muscle are NOT exposed.  Wound Description (Comments): 2 cm x 3 cm x 1 cm w/1.4 cm undermining from 12-3 o'clock  Present on Admission: Yes  Dressing Type Foam - Lift dressing to assess site every shift 11/21/22 0800     Pressure Injury 11/16/22 Elbow Anterior;Left Wound from arm contracture to elbow crevice, stage 2/ skin tear (Active)  11/16/22 2000  Location: Elbow  Location Orientation: Anterior;Left  Staging:   Wound Description (Comments): Wound from arm contracture to elbow crevice, stage 2/ skin tear  Present on Admission: Yes  Dressing Type None 11/21/22 0800    Consultants:  Palliative care  Procedures:  None  Antimicrobials:  Anti-infectives (From admission, onward)    Start     Dose/Rate Route Frequency Ordered Stop   11/20/22 1400  piperacillin-tazobactam (ZOSYN) IVPB 3.375 g        3.375 g 12.5 mL/hr over 240 Minutes Intravenous Every 8 hours 11/20/22 1251           Subjective: Patient seen and examined.  She continues to talk.  She says that she wants to sit up in the chair.  Although she appears to be more alert and cooperative but she is unable to answer the questions I am asking.  Objective: Vitals:   11/21/22 1603 11/21/22 2051 11/22/22 0610 11/22/22 0819  BP: (!) 101/55 104/61 118/69 125/61  Pulse: (!) 102 86  84 81  Resp:  16 17 18   Temp:  98.1 F (36.7 C) 98 F (36.7 C) 98 F (36.7 C)  TempSrc:      SpO2: 100% 100% 100% 100%  Weight:        Intake/Output Summary (Last 24 hours) at 11/22/2022 0954 Last data filed at 11/22/2022 0501 Gross per 24 hour  Intake 122.62 ml  Output 650 ml  Net -527.38 ml   Filed Weights   11/16/22 1911 11/17/22 0420  Weight: 57.1 kg 57.1 kg    Examination:  General exam: Appears calm and comfortable  Respiratory system:  Clear to auscultation. Respiratory effort normal. Cardiovascular system: S1 & S2 heard, RRR. No JVD, murmurs, rubs, gallops or clicks. No pedal edema. Gastrointestinal system: Abdomen is nondistended, soft and nontender. No organomegaly or masses felt. Normal bowel sounds heard. Central nervous system: Alert but not oriented.  Quadriplegic.  Data Reviewed: I have personally reviewed following labs and imaging studies  CBC: Recent Labs  Lab 11/16/22 1305 11/16/22 1332 11/19/22 0422 11/20/22 0842 11/22/22 0811  WBC 18.6*  --  19.6* 24.8* 16.6*  NEUTROABS 14.3*  --  13.8* 20.4* PENDING  HGB 14.7 15.6* 13.1 14.2 12.6  HCT 48.6* 46.0 42.6 45.1 40.3  MCV 85.0  --  84.4 84.0 82.8  PLT 486*  --  456* PLATELET CLUMPS NOTED ON SMEAR, UNABLE TO ESTIMATE 367   Basic Metabolic Panel: Recent Labs  Lab 11/16/22 1305 11/16/22 1332 11/19/22 0417 11/19/22 0422 11/20/22 0842 11/22/22 0811  NA 138 143  --  141 145 141  K 3.6 3.1*  --  2.9* 3.8 2.4*  CL 115*  --   --  118* 122* 116*  CO2 13*  --   --  12* 9* 15*  GLUCOSE 108*  --   --  101* 99 120*  BUN 26*  --   --  20 19 14   CREATININE 0.87  --   --  0.81 0.88 0.75  CALCIUM 8.3*  --   --  8.6* 8.8* 8.3*  MG 1.5*  --  2.1  --   --   --    GFR: Estimated Creatinine Clearance: 55.7 mL/min (by C-G formula based on SCr of 0.75 mg/dL). Liver Function Tests: Recent Labs  Lab 11/16/22 1305  AST 47*  ALT 17  ALKPHOS 100  BILITOT 0.4  PROT 6.9  ALBUMIN 2.4*   No results for  input(s): "LIPASE", "AMYLASE" in the last 168 hours. No results for input(s): "AMMONIA" in the last 168 hours. Coagulation Profile: No results for input(s): "INR", "PROTIME" in the last 168 hours. Cardiac Enzymes: No results for input(s): "CKTOTAL", "CKMB", "CKMBINDEX", "TROPONINI" in the last 168 hours. BNP (last 3 results) No results for input(s): "PROBNP" in the last 8760 hours. HbA1C: No results for input(s): "HGBA1C" in the last 72 hours. CBG: Recent Labs  Lab 11/16/22 1536 11/16/22 2125 11/17/22 0015 11/17/22 0416 11/17/22 0808  GLUCAP 141* 108* 138* 139* 108*   Lipid Profile: No results for input(s): "CHOL", "HDL", "LDLCALC", "TRIG", "CHOLHDL", "LDLDIRECT" in the last 72 hours. Thyroid Function Tests: No results for input(s): "TSH", "T4TOTAL", "FREET4", "T3FREE", "THYROIDAB" in the last 72 hours. Anemia Panel: No results for input(s): "VITAMINB12", "FOLATE", "FERRITIN", "TIBC", "IRON", "RETICCTPCT" in the last 72 hours. Sepsis Labs: Recent Labs  Lab 11/16/22 1305 11/20/22 1404  LATICACIDVEN 1.3 1.4    Recent Results (from the past 240 hour(s))  MRSA Next Gen by PCR, Nasal     Status: Abnormal   Collection Time: 11/16/22  7:56 PM   Specimen: Nasal Mucosa; Nasal Swab  Result Value Ref Range Status   MRSA by PCR Next Gen DETECTED (A) NOT DETECTED Final    Comment: RESULT CALLED TO, READ BACK BY AND VERIFIED WITH: RN TESSA PETERS ON 11/16/22 @ 2137 BY DRT (NOTE) The GeneXpert MRSA Assay (FDA approved for NASAL specimens only), is one component of a comprehensive MRSA colonization surveillance program. It is not intended to diagnose MRSA infection nor to guide or monitor treatment for MRSA infections. Test performance is not FDA approved in patients less than 2  years old. Performed at [facility], [address] [place], [place] [address]   Urine Culture     Status: None   Collection Time: 11/16/22 11:26 PM   Specimen: Urine, Random  Result Value Ref  Range Status   Specimen Description URINE, RANDOM  Final   Special Requests NONE Reflexed from [id number]  Final   Culture   Final    NO GROWTH Performed at [facility], [address], [place], [place] [address]    Report Status 11/18/2022 FINAL  Final  Culture, blood (Routine X 2) w Reflex to ID Panel     Status: None (Preliminary result)   Collection Time: 11/20/22  2:06 PM   Specimen: BLOOD RIGHT HAND  Result Value Ref Range Status   Specimen Description BLOOD RIGHT HAND  Final   Special Requests   Final    BOTTLES DRAWN AEROBIC ONLY Blood Culture results may not be optimal due to an inadequate volume of blood received in culture bottles   Culture   Final    NO GROWTH 1 DAY Performed at [facility], [address] [place], [place] [address]    Report Status PENDING  Incomplete  Culture, blood (Routine X 2) w Reflex to ID Panel     Status: None (Preliminary result)   Collection Time: 11/20/22  2:07 PM   Specimen: BLOOD RIGHT HAND  Result Value Ref Range Status   Specimen Description BLOOD RIGHT HAND  Final   Special Requests   Final    BOTTLES DRAWN AEROBIC ONLY Blood Culture results may not be optimal due to an inadequate volume of blood received in culture bottles   Culture   Final    NO GROWTH 1 DAY Performed at [facility], [address] [place], [place] [address]    Report Status PENDING  Incomplete     Radiology Studies: No results found.  Scheduled Meds:  apixaban  5 mg Oral BID   carvedilol  3.125 mg Oral BID WC   mupirocin ointment  1 Application Nasal BID   potassium chloride  40 mEq Oral Q4H   sacubitril-valsartan  1 tablet Oral BID   Continuous Infusions:  lacosamide (VIMPAT) IV 50 mg (11/21/22 2327)   levETIRAcetam 1,500 mg (11/21/22 2222)   piperacillin-tazobactam (ZOSYN)  IV 3.375 g (11/22/22 0501)     LOS: 6 days   Hughie Closs, MD [facility]  11/22/2022, 9:54 AM   *Please note that this is a verbal dictation  therefore any spelling or grammatical errors are due to the "Dragon [employer] One" system interpretation.  Please page via Amion and do not message via secure chat for urgent patient care matters. Secure chat can be used for non urgent patient care matters.  How to contact the [facility] Attending or Consulting provider 7A - 7P or covering provider during after hours 7P -7A, for this patient?  Check the care team in [facility] and look for a) attending/consulting [facility] provider listed and b) the [facility] team listed. Page or secure chat 7A-7P. Log into [email or web address] and use [facility] universal password to access. If you do not have the password, please contact the hospital operator. Locate the [facility] provider you are looking for under [facility] and page to a number that you can be directly reached. If you still have difficulty reaching the provider, please page the [facility] (Director on Call) for the Hospitalists listed on amion for assistance.

## 2022-11-22 NOTE — Evaluation (Signed)
Physical Therapy Evaluation Patient Details Name: Jocelyn Sanchez MRN: [id number] DOB: 06/24/1955 Today's Date: 11/22/2022  History of Present Illness  67 yo female presents to [facility] on 5/30 with possible seizures. CT head which showed asymmetric edema in the left cerebral hemisphere, EEG demonstrating seizures. Recent d/c home on 5/28 from [facility] due to anemia. PMH includes multiple sclerosis with quadriparetic/bedbound status, history of chronic sacral ulcer, diabetes mellitus type 2, hypertension, history of PE on Eliquis.  Clinical Impression   Pt presents with no active muscular contraction observed, total assist for bed mobility. At baseline pt is bed-level, assuming total care as well. Pt with no acute or post-acute PT needs based on evaluation, would recommend long term care vs home with constant support. No family at bedside during session, arrived to room after session. PT to sign off given pt is total care.        Recommendations for follow up therapy are one component of a multi-disciplinary discharge planning process, led by the attending physician.  Recommendations may be updated based on patient status, additional functional criteria and insurance authorization.  Follow Up Recommendations       Assistance Recommended at Discharge Frequent or constant Supervision/Assistance  Patient can return home with the following  Two people to help with walking and/or transfers;Two people to help with bathing/dressing/bathroom    Equipment Recommendations None recommended by PT  Recommendations for Other Services       Functional Status Assessment Patient has not had a recent decline in their functional status     Precautions / Restrictions Precautions Precautions: Fall Restrictions Weight Bearing Restrictions: No      Mobility  Bed Mobility Overal bed mobility: Needs Assistance Bed Mobility: Rolling Rolling: Total assist, +2 for physical assistance         General bed  mobility comments: total +2 all aspects, pt grimacing in pain    Transfers                   General transfer comment: nt    Ambulation/Gait               General Gait Details: nt  Information systems manager [employer] (Stroke Patients Only)       Balance Overall balance assessment: Needs assistance   Sitting balance-Leahy Scale: Zero Sitting balance - Comments: bedlevel     Standing balance-Leahy Scale: Zero Standing balance comment: bed level                             Pertinent Vitals/Pain Pain Assessment Pain Assessment: Faces Faces Pain Scale: Hurts a little bit Pain Location: UEs and LEs during ROM Pain Descriptors / Indicators: Discomfort, Grimacing, Sore Pain Intervention(s): Limited activity within patient's tolerance, Monitored during session, Repositioned    Home Living Family/patient expects to be discharged to:: Private residence Living Arrangements: Other relatives Available Help at Discharge: Family Type of Home: House Home Access: Ramped entrance       Home Layout: One level Home Equipment: Hospital bed;Wheelchair - Careers adviser (comment) Additional Comments: hoyer    Prior Function Prior Level of Function : Needs assist             Mobility Comments: 52 hours of aide assist a week, total care       Hand Dominance   Dominant Hand: Right    Extremity/Trunk Assessment  Upper Extremity Assessment Upper Extremity Assessment: RUE deficits/detail;LUE deficits/detail RUE Deficits / Details: no active contraction appreciated. limited wrist flex/ext, fingers kept in PIP extension and DIP flexion and not able to move out of this. lacking at least 20 degrees elbow extension LUE Deficits / Details: no active contraction appreciated. limited wrist flex/ext, fingers kept in PIP extension and DIP flexion and not able to move out of this. lacking 90 degrees elbow extension, limited by  hypertonicity and contracture    Lower Extremity Assessment Lower Extremity Assessment: RLE deficits/detail;LLE deficits/detail RLE Deficits / Details: no active movement appreciated, easily ranges through hip and knee flexion on R, hypertonicity in hip and knee flexion on L. Pt reporting pain with hip abd/add and DF/PF bilat LLE Deficits / Details: see above    Cervical / Trunk Assessment Cervical / Trunk Assessment: Other exceptions Cervical / Trunk Exceptions: holds head in cervical flexion and L lateral flexion, L rotation  Communication      Cognition Arousal/Alertness: Awake/alert Behavior During Therapy: Flat affect Overall Cognitive Status: No family/caregiver present to determine baseline cognitive functioning                                 General Comments: pt stating "I think I know you", "that's what I am trying to say", "do you have any suggestions about the arthritis" not in context to session. Pt does indicate pain appropriately        General Comments      Exercises General Exercises - Lower Extremity Ankle Circles/Pumps: PROM, Both, 10 reps, Supine Heel Slides: PROM, Both, 10 reps, Supine Hip ABduction/ADduction: PROM, Both, 10 reps, Supine Shoulder Exercises Elbow Flexion: PROM, Both, 10 reps, Supine Elbow Extension: PROM, Both, 10 reps, Sidelying Wrist Flexion: PROM, Both, 10 reps, Supine Wrist Extension: PROM, Both, 10 reps, Supine   Assessment/Plan    PT Assessment Patient does not need any further PT services  PT Problem List         PT Treatment Interventions      PT Goals (Current goals can be found in the Care Plan section)  Acute Rehab PT Goals PT Goal Formulation: All assessment and education complete, DC therapy Time For Goal Achievement: 11/22/22 Potential to Achieve Goals: Poor    Frequency       Co-evaluation               AM-PAC PT "6 Clicks" Mobility  Outcome Measure Help needed turning from your back to  your side while in a flat bed without using bedrails?: Total Help needed moving from lying on your back to sitting on the side of a flat bed without using bedrails?: Total Help needed moving to and from a bed to a chair (including a wheelchair)?: Total Help needed standing up from a chair using your arms (e.g., wheelchair or bedside chair)?: Total Help needed to walk in hospital room?: Total Help needed climbing 3-5 steps with a railing? : Total 6 Click Score: 6    End of Session   Activity Tolerance: Patient limited by pain Patient left: in bed;with call bell/phone within reach;with bed alarm set;with family/visitor present;with nursing/sitter in room Nurse Communication: Mobility status PT Visit Diagnosis: Other abnormalities of gait and mobility (R26.89)    Time: 4098-1191 PT Time Calculation (min) (ACUTE ONLY): 21 min   Charges:   PT Evaluation $PT Eval Low Complexity: 1 Low  Marye Round, PT DPT Acute Rehabilitation Services Secure Chat Preferred  Office [phone]   Truddie Coco 11/22/2022, 11:55 AM

## 2022-11-23 DIAGNOSIS — G35 Multiple sclerosis: Secondary | ICD-10-CM | POA: Diagnosis not present

## 2022-11-23 DIAGNOSIS — R569 Unspecified convulsions: Secondary | ICD-10-CM | POA: Diagnosis not present

## 2022-11-23 DIAGNOSIS — G9341 Metabolic encephalopathy: Secondary | ICD-10-CM

## 2022-11-23 DIAGNOSIS — R1319 Other dysphagia: Secondary | ICD-10-CM | POA: Diagnosis not present

## 2022-11-23 LAB — GLUCOSE, CAPILLARY
Glucose-Capillary: 119 mg/dL — ABNORMAL HIGH (ref 70–99)
Glucose-Capillary: 143 mg/dL — ABNORMAL HIGH (ref 70–99)
Glucose-Capillary: 128 mg/dL — ABNORMAL HIGH (ref 70–99)
Glucose-Capillary: 117 mg/dL — ABNORMAL HIGH (ref 70–99)

## 2022-11-23 LAB — URINE CULTURE

## 2022-11-23 NOTE — Progress Notes (Addendum)
PROGRESS NOTE    Jocelyn Sanchez  ZOX:[id number] DOB: June 15, 1956 DOA: 11/16/2022 PCP: Bethanie Dicker, FNP   Brief Narrative:  Jocelyn Sanchez is a 67 y.o. female with PMH significant for multiple sclerosis with quadriplegia/bedbound status, chronic sacral ulcer, prior PE on Eliquis, anemia, DM2, hypertension, lymphedema.   5/30, patient was brought to the ED by EMS with seizures. No history of seizure,.  Family noted patient was unwell, not responding and foaming from her mouth.. EMS noted rhythmic shaking of all extremities as well as horizontal rhythmic moving of eyes.  She was given IM Versed.  Remained tachycardic and had notable pinpoint pupil.  Narcan was given without improvement.  Patient was given BMV ventilation and brought to the ED.   DNR   CT head with asymmetric edema in the left cerebral hemisphere possibly secondary to postictal state, but early large left MCA territory infarct is also possible.  Started on Keppra.  Neurology consulted Admitted to ICU for status epilepticus 5/31, patient had prolonged seizure and EEG.   5/31, PCCM Dr. Tonia Brooms had an extensive bedside discussion with patient's family regarding her current circumstances, poor prognosis and poor quality of life.  Family made a choice to transition her to full comfort care Transferred to [facility]  Assessment & Plan:   Principal Problem:   Seizure [facility]) Active Problems:   Acute metabolic encephalopathy  New onset seizure / Status epilepticus Primarily admitted for seizure.   AEDs to continue to prevent further seizures Currently on Keppra, Vimpat and as needed Versed IV.  Although she is on the diet but need to see more evidence of good oral intake before we switch to oral medications.    Acute metabolic encephalopathy Family states he had baseline normal cognitive function.   Patient had prolonged seizure leading to prolonged postictal state and altered mental status. Family has noticed gradual improvement  and is hopeful for meaningful recovery.  Per notes from previous hospitalist, she was only able to open eyes on commands but mumbles.  Since I assumed her care on 11/22/2022, I have seen improvement as well.  Patient's speech is comprehensible although it is hard for patient to understand questions and answer appropriately.  She kept telling me what her name was today even though I switch the question and asked what her date of birth was.   Multiple sclerosis with quadriplegia/bedbound status Chronic sacral ulcer with hx of quadriplegia (POA) Continue supportive care Local would care  Pressure alleviating devices    SIRS secondary to presumed UTI, patient with history of ESBL UTI 11/08/2022, treated with Zosyn. 6/3, patient was tachycardic, WBC count was elevated.  Urinalysis showed turbid yellow urine with positive nitrate, large hemoglobin, moderate leukocytes and many bacteria.  She was started on Zosyn for the treatment of UTI.  Urine culture is not sent.  I will order urine culture.  Hypokalemia Labs pending  History of PE: Continue Eliquis.  H/o combined systolic and diastolic CHF/ Essential hypertension Stable.  Continue home medications which is Coreg and Entresto.     Type 2 diabetes Last hemoglobin A1c documented in the chart was 3 years ago which is 6.7.  I do not see any order for repeat hemoglobin A1c or SSI.  Will order both.  Goal of care: Family meeting took place with palliative care on 11/22/2022, they decided in favor of no artificial feeding or hydration now or in the future, comfort feeds as tolerated.  Family is hopeful for SNF for long-term care and  palliative services.  They are not interested in hospice at this time.  Appreciate palliative care help.  DVT prophylaxis: Eliquis   Code Status: DNR  Family Communication:  None present at bedside.  Palliative care in constant communication with family.  Status is: Inpatient Remains inpatient appropriate because: Medically  stable now.  Pending placement to SNF for long-term care.  TOC working on it.   Estimated body mass index is 22.3 kg/m as calculated from the following:   Height as of 11/08/22: 5\' 3"  (1.6 m).   Weight as of this encounter: 57.1 kg.  Pressure Injury 11/09/22 Heel Left Deep Tissue Pressure Injury - Purple or maroon localized area of discolored intact skin or blood-filled blister due to damage of underlying soft tissue from pressure and/or shear. 2 cm x 3 cm (Active)  11/09/22 0700  Location: Heel  Location Orientation: Left  Staging: Deep Tissue Pressure Injury - Purple or maroon localized area of discolored intact skin or blood-filled blister due to damage of underlying soft tissue from pressure and/or shear.  Wound Description (Comments): 2 cm x 3 cm  Present on Admission:   Dressing Type Foam - Lift dressing to assess site every shift 11/21/22 0800     Pressure Injury 11/09/22 Buttocks Right Stage 3 -  Full thickness tissue loss. Subcutaneous fat may be visible but bone, tendon or muscle are NOT exposed. 2 cm x 3 cm x 1 cm w/1.4 cm undermining from 12-3 o'clock (Active)  11/09/22 0700  Location: Buttocks  Location Orientation: Right  Staging: Stage 3 -  Full thickness tissue loss. Subcutaneous fat may be visible but bone, tendon or muscle are NOT exposed.  Wound Description (Comments): 2 cm x 3 cm x 1 cm w/1.4 cm undermining from 12-3 o'clock  Present on Admission: Yes  Dressing Type Gauze (Comment) 11/22/22 2100     Pressure Injury 11/16/22 Elbow Anterior;Left Wound from arm contracture to elbow crevice, stage 2/ skin tear (Active)  11/16/22 2000  Location: Elbow  Location Orientation: Anterior;Left  Staging:   Wound Description (Comments): Wound from arm contracture to elbow crevice, stage 2/ skin tear  Present on Admission: Yes  Dressing Type None 11/22/22 2100   Nutritional Assessment: Body mass index is 22.3 kg/m.Marland Kitchen Seen by dietician.  I agree with the assessment and plan  as outlined below: Nutrition Status:        . Skin Assessment: I have examined the patient's skin and I agree with the wound assessment as performed by the wound care RN as outlined below: Pressure Injury 11/09/22 Heel Left Deep Tissue Pressure Injury - Purple or maroon localized area of discolored intact skin or blood-filled blister due to damage of underlying soft tissue from pressure and/or shear. 2 cm x 3 cm (Active)  11/09/22 0700  Location: Heel  Location Orientation: Left  Staging: Deep Tissue Pressure Injury - Purple or maroon localized area of discolored intact skin or blood-filled blister due to damage of underlying soft tissue from pressure and/or shear.  Wound Description (Comments): 2 cm x 3 cm  Present on Admission:   Dressing Type Foam - Lift dressing to assess site every shift 11/21/22 0800     Pressure Injury 11/09/22 Buttocks Right Stage 3 -  Full thickness tissue loss. Subcutaneous fat may be visible but bone, tendon or muscle are NOT exposed. 2 cm x 3 cm x 1 cm w/1.4 cm undermining from 12-3 o'clock (Active)  11/09/22 0700  Location: Buttocks  Location Orientation: Right  Staging:  Stage 3 -  Full thickness tissue loss. Subcutaneous fat may be visible but bone, tendon or muscle are NOT exposed.  Wound Description (Comments): 2 cm x 3 cm x 1 cm w/1.4 cm undermining from 12-3 o'clock  Present on Admission: Yes  Dressing Type Gauze (Comment) 11/22/22 2100     Pressure Injury 11/16/22 Elbow Anterior;Left Wound from arm contracture to elbow crevice, stage 2/ skin tear (Active)  11/16/22 2000  Location: Elbow  Location Orientation: Anterior;Left  Staging:   Wound Description (Comments): Wound from arm contracture to elbow crevice, stage 2/ skin tear  Present on Admission: Yes  Dressing Type None 11/22/22 2100    Consultants:  Palliative care  Procedures:  None  Antimicrobials:  Anti-infectives (From admission, onward)    Start     Dose/Rate Route Frequency  Ordered Stop   11/20/22 1400  piperacillin-tazobactam (ZOSYN) IVPB 3.375 g        3.375 g 12.5 mL/hr over 240 Minutes Intravenous Every 8 hours 11/20/22 1251           Subjective: Seen and examined she continues to talk, speech is much more comprehensible today.  Appears to be very well alert and oriented to self only.  Objective: Vitals:   11/22/22 1559 11/22/22 2017 11/23/22 0446 11/23/22 0815  BP: 125/75 109/66 121/79 130/83  Pulse: 84 99 99 98  Resp: 18 19 20 18   Temp: 97.9 F (36.6 C) 98.1 F (36.7 C) 98.2 F (36.8 C) 97.7 F (36.5 C)  TempSrc:  Oral Axillary   SpO2: 100% 97% 99% 100%  Weight:        Intake/Output Summary (Last 24 hours) at 11/23/2022 1001 Last data filed at 11/23/2022 0956 Gross per 24 hour  Intake 927 ml  Output 1600 ml  Net -673 ml    Filed Weights   11/16/22 1911 11/17/22 0420  Weight: 57.1 kg 57.1 kg    Examination:  General exam: Appears calm and comfortable  Respiratory system: Clear to auscultation. Respiratory effort normal. Cardiovascular system: S1 & S2 heard, RRR. No JVD, murmurs, rubs, gallops or clicks. No pedal edema. Gastrointestinal system: Abdomen is nondistended, soft and nontender. No organomegaly or masses felt. Normal bowel sounds heard. Central nervous system: Alert but not oriented.  Quadriplegic.  Data Reviewed: I have personally reviewed following labs and imaging studies  CBC: Recent Labs  Lab 11/16/22 1305 11/16/22 1332 11/19/22 0422 11/20/22 0842 11/22/22 0811  WBC 18.6*  --  19.6* 24.8* 16.6*  NEUTROABS 14.3*  --  13.8* 20.4* 13.6*  HGB 14.7 15.6* 13.1 14.2 12.6  HCT 48.6* 46.0 42.6 45.1 40.3  MCV 85.0  --  84.4 84.0 82.8  PLT 486*  --  456* PLATELET CLUMPS NOTED ON SMEAR, UNABLE TO ESTIMATE 367    Basic Metabolic Panel: Recent Labs  Lab 11/16/22 1305 11/16/22 1332 11/19/22 0417 11/19/22 0422 11/20/22 0842 11/22/22 0811  NA 138 143  --  141 145 141  K 3.6 3.1*  --  2.9* 3.8 2.4*  CL 115*  --    --  118* 122* 116*  CO2 13*  --   --  12* 9* 15*  GLUCOSE 108*  --   --  101* 99 120*  BUN 26*  --   --  20 19 14   CREATININE 0.87  --   --  0.81 0.88 0.75  CALCIUM 8.3*  --   --  8.6* 8.8* 8.3*  MG 1.5*  --  2.1  --   --   --  GFR: Estimated Creatinine Clearance: 55.7 mL/min (by C-G formula based on SCr of 0.75 mg/dL). Liver Function Tests: Recent Labs  Lab 11/16/22 1305  AST 47*  ALT 17  ALKPHOS 100  BILITOT 0.4  PROT 6.9  ALBUMIN 2.4*    No results for input(s): "LIPASE", "AMYLASE" in the last 168 hours. No results for input(s): "AMMONIA" in the last 168 hours. Coagulation Profile: No results for input(s): "INR", "PROTIME" in the last 168 hours. Cardiac Enzymes: No results for input(s): "CKTOTAL", "CKMB", "CKMBINDEX", "TROPONINI" in the last 168 hours. BNP (last 3 results) No results for input(s): "PROBNP" in the last 8760 hours. HbA1C: Recent Labs    11/22/22 0959  HGBA1C 5.2   CBG: Recent Labs  Lab 11/17/22 0808 11/22/22 1318 11/22/22 1600 11/22/22 1955 11/23/22 0814  GLUCAP 108* 118* 127* 156* 119*    Lipid Profile: No results for input(s): "CHOL", "HDL", "LDLCALC", "TRIG", "CHOLHDL", "LDLDIRECT" in the last 72 hours. Thyroid Function Tests: No results for input(s): "TSH", "T4TOTAL", "FREET4", "T3FREE", "THYROIDAB" in the last 72 hours. Anemia Panel: No results for input(s): "VITAMINB12", "FOLATE", "FERRITIN", "TIBC", "IRON", "RETICCTPCT" in the last 72 hours. Sepsis Labs: Recent Labs  Lab 11/16/22 1305 11/20/22 1404  LATICACIDVEN 1.3 1.4     Recent Results (from the past 240 hour(s))  MRSA Next Gen by PCR, Nasal     Status: Abnormal   Collection Time: 11/16/22  7:56 PM   Specimen: Nasal Mucosa; Nasal Swab  Result Value Ref Range Status   MRSA by PCR Next Gen DETECTED (A) NOT DETECTED Final    Comment: RESULT CALLED TO, READ BACK BY AND VERIFIED WITH: RN TESSA PETERS ON 11/16/22 @ 2137 BY DRT (NOTE) The GeneXpert MRSA Assay (FDA  approved for NASAL specimens only), is one component of a comprehensive MRSA colonization surveillance program. It is not intended to diagnose MRSA infection nor to guide or monitor treatment for MRSA infections. Test performance is not FDA approved in patients less than 7 years old. Performed at [facility], [address], [place], [place] [address]   Urine Culture     Status: None   Collection Time: 11/16/22 11:26 PM   Specimen: Urine, Random  Result Value Ref Range Status   Specimen Description URINE, RANDOM  Final   Special Requests NONE Reflexed from [id number]  Final   Culture   Final    NO GROWTH Performed at [facility], [address], [place], [place] [address]    Report Status 11/18/2022 FINAL  Final  Culture, blood (Routine X 2) w Reflex to ID Panel     Status: None (Preliminary result)   Collection Time: 11/20/22  2:06 PM   Specimen: BLOOD RIGHT HAND  Result Value Ref Range Status   Specimen Description BLOOD RIGHT HAND  Final   Special Requests   Final    BOTTLES DRAWN AEROBIC ONLY Blood Culture results may not be optimal due to an inadequate volume of blood received in culture bottles   Culture   Final    NO GROWTH 3 DAYS Performed at [facility], [address] [place], [place] [address]    Report Status PENDING  Incomplete  Culture, blood (Routine X 2) w Reflex to ID Panel     Status: None (Preliminary result)   Collection Time: 11/20/22  2:07 PM   Specimen: BLOOD RIGHT HAND  Result Value Ref Range Status   Specimen Description BLOOD RIGHT HAND  Final   Special Requests  Final    BOTTLES DRAWN AEROBIC ONLY Blood Culture results may not be optimal due to an inadequate volume of blood received in culture bottles   Culture   Final    NO GROWTH 3 DAYS Performed at [facility], [address] [place], [place] [address]    Report Status PENDING  Incomplete     Radiology Studies: No results found.  Scheduled Meds:  apixaban  5 mg  Oral BID   carvedilol  3.125 mg Oral BID WC   insulin aspart  0-5 Units Subcutaneous QHS   insulin aspart  0-9 Units Subcutaneous TID WC   mupirocin ointment  1 Application Nasal BID   sacubitril-valsartan  1 tablet Oral BID   Continuous Infusions:  lacosamide (VIMPAT) IV 50 mg (11/22/22 2148)   levETIRAcetam 1,500 mg (11/23/22 0947)   piperacillin-tazobactam (ZOSYN)  IV 3.375 g (11/23/22 6045)     LOS: 7 days   Hughie Closs, MD [facility]  11/23/2022, 10:01 AM   *Please note that this is a verbal dictation therefore any spelling or grammatical errors are due to the "Dragon Medical One" system interpretation.  Please page via Amion and do not message via secure chat for urgent patient care matters. Secure chat can be used for non urgent patient care matters.  How to contact the [facility] Attending or Consulting provider 7A - 7P or covering provider during after hours 7P -7A, for this patient?  Check the care team in [facility] and look for a) attending/consulting [facility] provider listed and b) the [facility] team listed. Page or secure chat 7A-7P. Log into [email or web address] and use [place] universal password to access. If you do not have the password, please contact the hospital operator. Locate the [facility] provider you are looking for under [facility] and page to a number that you can be directly reached. If you still have difficulty reaching the provider, please page the [facility] (Director on Call) for the Hospitalists listed on amion for assistance.

## 2022-11-23 NOTE — Progress Notes (Signed)
Pharmacy Antibiotic Note  Jocelyn Sanchez is a 67 y.o. female admitted on 11/16/2022 with chest pain. Patient with sacral wound that appears infected.   Pt was recently treated with a course of zosyn for ESBL. WBC is slightly down. She is on D4 of zosyn. Remains AF  Scr<1  Plan: Zosyn 3.375g IV q8  Weight: 57.1 kg (125 lb 14.1 oz)  Temp (24hrs), Avg:98 F (36.7 C), Min:97.7 F (36.5 C), Max:98.2 F (36.8 C)  Recent Labs  Lab 11/16/22 1305 11/19/22 0422 11/20/22 0842 11/20/22 1404 11/22/22 0811  WBC 18.6* 19.6* 24.8*  --  16.6*  CREATININE 0.87 0.81 0.88  --  0.75  LATICACIDVEN 1.3  --   --  1.4  --      Estimated Creatinine Clearance: 55.7 mL/min (by C-G formula based on SCr of 0.75 mg/dL).    Allergies  Allergen Reactions   Sulfa Antibiotics Anaphylaxis, Shortness Of Breath and Swelling   Chlorhexidine Other (See Comments)    Reaction not recalled    Antimicrobials this admission: Vancomycin 5/22 >>5/24 Zosyn 5/22 >>5/27 6/3>> Cefepime 5/22 x 1  Dose adjustments this admission: NA  Microbiology results: 5/22 BCx: negF 5/22 UCx: ESBL e.coli 5/30 urine>>negF 6/3 blood>>ngtd 5/30 urine>>neg  Ulyses Southward, PharmD, [place], AAHIVP, CPP Infectious Disease Pharmacist 11/23/2022 10:55 AM

## 2022-11-23 NOTE — Progress Notes (Signed)
Patient ID: Jocelyn Sanchez, female   DOB: Feb 16, 1956, 67 y.o.   MRN: [id number]    Progress Note from the Palliative Medicine Team at [facility]   [name]        Date: 11/23/2022 DOB: Mar 26, 1956  Age: 67 y.o. MRN#: [id number] Attending Physician: Hughie Closs, MD Primary Care Physician: Bethanie Dicker, FNP Admit Date: 11/16/2022   Medical records reviewed    Jocelyn Sanchez is a 67 y.o. female with PMH significant for multiple sclerosis with quadriplegia/bedbound status, chronic sacral ulcer, prior PE on Eliquis, anemia, DM2, hypertension, lymphedema.   Patient has had continued physical, functional and cognitive decline over the past many years 2/2 to MS disease progression.    Palliative care  asked to get involved to support further goals of care conversations in the setting of multiple chronic comorbid conditions and new onset seizures.    This NP assessed patient at the bedside as a follow up for palliative medicine needs and emotional support.  Patient is a week attempts to communicate with verbal interaction.  Communication is difficult secondary to disease progression.  It appears comfortable  I spoke with her sister in the hallway.  Family all are in agreement that focus of care should center on comfort and dignity allowing for natural death.   Family await communication with transition of care team regarding SNF placement   Plan of care: -DNR/DNI -no artifical feeding or hydration now or in the future - comfort feeds as tolerated, family understand know risk for aspiration -family is hopeful for SNF for long term care and Palliative services, not interested in hospice at his time -avoid re-hospitalizations  MOST form completed reflect above goals of care  Again family 's choices for SNF are;  [facility], [facility], [facility]   Education offered today regarding  the importance of continued conversation with family and their  medical  providers regarding overall plan of care and treatment options,  ensuring decisions are within the context of the patients values and GOCs.  Questions and concerns addressed   Discussed with Dr Jacqulyn Bath and [facility] team via secure chat  PMT available for questions or concerns   Time: 25 minutes  Detailed review of medical records ( labs, imaging, vital signs), medically appropriate exam ( MS, skin, resp)   discussed with treatment team, counseling and education to patient, family, staff, documenting clinical information, medication management, coordination of care    Lorinda Creed NP  Palliative Medicine Team Team Phone # [phone] Pager [phone]

## 2022-11-23 NOTE — Plan of Care (Signed)
  Problem: Education: Goal: Ability to describe self-care measures that may prevent or decrease complications (Diabetes Survival Skills Education) will improve Outcome: Progressing   Problem: Coping: Goal: Ability to adjust to condition or change in health will improve Outcome: Progressing   Problem: Fluid Volume: Goal: Ability to maintain a balanced intake and output will improve Outcome: Progressing   Problem: Health Behavior/Discharge Planning: Goal: Ability to identify and utilize available resources and services will improve Outcome: Progressing Goal: Ability to manage health-related needs will improve Outcome: Progressing   Problem: Metabolic: Goal: Ability to maintain appropriate glucose levels will improve Outcome: Progressing   Problem: Nutritional: Goal: Maintenance of adequate nutrition will improve Outcome: Progressing Goal: Progress toward achieving an optimal weight will improve Outcome: Progressing   Problem: Skin Integrity: Goal: Risk for impaired skin integrity will decrease Outcome: Not Progressing   Problem: Tissue Perfusion: Goal: Adequacy of tissue perfusion will improve Outcome: Progressing

## 2022-11-24 DIAGNOSIS — R569 Unspecified convulsions: Secondary | ICD-10-CM | POA: Diagnosis not present

## 2022-11-24 LAB — CBC WITH DIFFERENTIAL/PLATELET
Abs Immature Granulocytes: 0 10*3/uL (ref 0.00–0.07)
Basophils Absolute: 0.3 10*3/uL — ABNORMAL HIGH (ref 0.0–0.1)
Basophils Relative: 3 %
Eosinophils Absolute: 0.5 10*3/uL (ref 0.0–0.5)
Eosinophils Relative: 5 %
HCT: 39.7 % (ref 36.0–46.0)
Hemoglobin: 12.1 g/dL (ref 12.0–15.0)
Lymphocytes Relative: 18 %
Lymphs Abs: 1.9 10*3/uL (ref 0.7–4.0)
MCH: 26.4 pg (ref 26.0–34.0)
MCHC: 30.5 g/dL (ref 30.0–36.0)
MCV: 86.7 fL (ref 80.0–100.0)
Monocytes Absolute: 0.1 10*3/uL (ref 0.1–1.0)
Monocytes Relative: 1 %
Neutro Abs: 7.5 10*3/uL (ref 1.7–7.7)
Neutrophils Relative %: 73 %
Platelets: 510 10*3/uL — ABNORMAL HIGH (ref 150–400)
RBC: 4.58 MIL/uL (ref 3.87–5.11)
RDW: 26.6 % — ABNORMAL HIGH (ref 11.5–15.5)
WBC: 10.3 10*3/uL (ref 4.0–10.5)
nRBC: 0 % (ref 0.0–0.2)

## 2022-11-24 LAB — GLUCOSE, CAPILLARY
Glucose-Capillary: 97 mg/dL (ref 70–99)
Glucose-Capillary: 84 mg/dL (ref 70–99)
Glucose-Capillary: 124 mg/dL — ABNORMAL HIGH (ref 70–99)
Glucose-Capillary: 179 mg/dL — ABNORMAL HIGH (ref 70–99)
Glucose-Capillary: 93 mg/dL (ref 70–99)

## 2022-11-24 LAB — BASIC METABOLIC PANEL
Anion gap: 9 (ref 5–15)
BUN: 10 mg/dL (ref 8–23)
CO2: 15 mmol/L — ABNORMAL LOW (ref 22–32)
Calcium: 8.3 mg/dL — ABNORMAL LOW (ref 8.9–10.3)
Chloride: 114 mmol/L — ABNORMAL HIGH (ref 98–111)
Creatinine, Ser: 0.65 mg/dL (ref 0.44–1.00)
GFR, Estimated: >60 mL/min (ref >60)
Glucose, Bld: 116 mg/dL — ABNORMAL HIGH (ref 70–99)
Potassium: 4.1 mmol/L (ref 3.5–5.1)
Sodium: 138 mmol/L (ref 135–145)

## 2022-11-24 NOTE — Progress Notes (Signed)
PROGRESS NOTE    Jocelyn Sanchez  UEA:[id number] DOB: 08-22-1955 DOA: 11/16/2022 PCP: Bethanie Dicker, FNP   Brief Narrative:  Jocelyn Sanchez is a 67 y.o. female with PMH significant for multiple sclerosis with quadriplegia/bedbound status, chronic sacral ulcer, prior PE on Eliquis, anemia, DM2, hypertension, lymphedema.   5/30, patient was brought to the ED by EMS with seizures. No history of seizure,.  Family noted patient was unwell, not responding and foaming from her mouth.. EMS noted rhythmic shaking of all extremities as well as horizontal rhythmic moving of eyes.  She was given IM Versed.  Remained tachycardic and had notable pinpoint pupil.  Narcan was given without improvement.  Patient was given BMV ventilation and brought to the ED.   DNR   CT head with asymmetric edema in the left cerebral hemisphere possibly secondary to postictal state, but early large left MCA territory infarct is also possible.  Started on Keppra.  Neurology consulted Admitted to ICU for status epilepticus 5/31, patient had prolonged seizure and EEG.   5/31, PCCM Dr. Tonia Brooms had an extensive bedside discussion with patient's family regarding her current circumstances, poor prognosis and poor quality of life.  Family made a choice to transition her to full comfort care Transferred to [facility]  Assessment & Plan:   Principal Problem:   Seizure [facility]) Active Problems:   Acute metabolic encephalopathy  New onset seizure / Status epilepticus Primarily admitted for seizure.   AEDs to continue to prevent further seizures Currently on Keppra, Vimpat and as needed Versed IV.  Although she is on the diet but need to see more evidence of good oral intake before we switch to oral medications.    Acute metabolic encephalopathy Family states he had baseline normal cognitive function.   Patient had prolonged seizure leading to prolonged postictal state and altered mental status. Family has noticed gradual improvement  and is hopeful for meaningful recovery.  Per notes from previous hospitalist, she was only able to open eyes on commands but mumbles.  Since I assumed her care on 11/22/2022, I have seen improvement as well.  Patient's speech is comprehensible although it is hard for patient to understand questions and answer appropriately.  But she did slightly better today.  She said that she just wants to go home.  She had no complaints.   Multiple sclerosis with quadriplegia/bedbound status Chronic sacral ulcer with hx of quadriplegia (POA) Continue supportive care Local would care  Pressure alleviating devices    SIRS secondary to presumed UTI, patient with history of ESBL UTI 11/08/2022, treated with Zosyn. 6/3, patient was tachycardic, WBC count was elevated.  Urinalysis showed turbid yellow urine with positive nitrate, large hemoglobin, moderate leukocytes and many bacteria.  She was started on Zosyn for the treatment of UTI.  Urine culture was not sent.  I ordered urine culture 2 days ago but still not sent again.  We will just complete 5 days of Zosyn.  Hypokalemia Labs pending today as well.  History of PE: Continue Eliquis.  H/o combined systolic and diastolic CHF/ Essential hypertension Stable.  Continue home medications which is Coreg and Entresto.     Type 2 diabetes Last hemoglobin A1c documented in the chart was 3 years ago which is 6.7.  Hemoglobin A1c at this time was 5.2.  Blood sugar very well-controlled.  She has not required any insulin per SSI.  Will discontinue that.  Goal of care: Family meeting took place with palliative care on 11/22/2022, they decided in  favor of no artificial feeding or hydration now or in the future, comfort feeds as tolerated.  Family is hopeful for SNF for long-term care and palliative services.  They are not interested in hospice at this time.  Appreciate palliative care help.  DVT prophylaxis: Eliquis   Code Status: DNR  Family Communication:  None present at  bedside.  Palliative care in constant communication with family.  Status is: Inpatient Remains inpatient appropriate because: Medically stable now.  Pending placement to SNF for long-term care.  TOC working on it.  Waiting for family to pick a place.   Estimated body mass index is 22.3 kg/m as calculated from the following:   Height as of 11/08/22: 5\' 3"  (1.6 m).   Weight as of this encounter: 57.1 kg.  Pressure Injury 11/09/22 Heel Left Deep Tissue Pressure Injury - Purple or maroon localized area of discolored intact skin or blood-filled blister due to damage of underlying soft tissue from pressure and/or shear. 2 cm x 3 cm (Active)  11/09/22 0700  Location: Heel  Location Orientation: Left  Staging: Deep Tissue Pressure Injury - Purple or maroon localized area of discolored intact skin or blood-filled blister due to damage of underlying soft tissue from pressure and/or shear.  Wound Description (Comments): 2 cm x 3 cm  Present on Admission:   Dressing Type Foam - Lift dressing to assess site every shift 11/23/22 2000     Pressure Injury 11/09/22 Buttocks Right Stage 3 -  Full thickness tissue loss. Subcutaneous fat may be visible but bone, tendon or muscle are NOT exposed. 2 cm x 3 cm x 1 cm w/1.4 cm undermining from 12-3 o'clock (Active)  11/09/22 0700  Location: Buttocks  Location Orientation: Right  Staging: Stage 3 -  Full thickness tissue loss. Subcutaneous fat may be visible but bone, tendon or muscle are NOT exposed.  Wound Description (Comments): 2 cm x 3 cm x 1 cm w/1.4 cm undermining from 12-3 o'clock  Present on Admission: Yes  Dressing Type Gauze (Comment) 11/24/22 0700     Pressure Injury 11/16/22 Elbow Anterior;Left Wound from arm contracture to elbow crevice, stage 2/ skin tear (Active)  11/16/22 2000  Location: Elbow  Location Orientation: Anterior;Left  Staging:   Wound Description (Comments): Wound from arm contracture to elbow crevice, stage 2/ skin tear   Present on Admission: Yes  Dressing Type None 11/24/22 0700   Nutritional Assessment: Body mass index is 22.3 kg/m.Marland Kitchen Seen by dietician.  I agree with the assessment and plan as outlined below: Nutrition Status:        . Skin Assessment: I have examined the patient's skin and I agree with the wound assessment as performed by the wound care RN as outlined below: Pressure Injury 11/09/22 Heel Left Deep Tissue Pressure Injury - Purple or maroon localized area of discolored intact skin or blood-filled blister due to damage of underlying soft tissue from pressure and/or shear. 2 cm x 3 cm (Active)  11/09/22 0700  Location: Heel  Location Orientation: Left  Staging: Deep Tissue Pressure Injury - Purple or maroon localized area of discolored intact skin or blood-filled blister due to damage of underlying soft tissue from pressure and/or shear.  Wound Description (Comments): 2 cm x 3 cm  Present on Admission:   Dressing Type Foam - Lift dressing to assess site every shift 11/23/22 2000     Pressure Injury 11/09/22 Buttocks Right Stage 3 -  Full thickness tissue loss. Subcutaneous fat may be visible but  bone, tendon or muscle are NOT exposed. 2 cm x 3 cm x 1 cm w/1.4 cm undermining from 12-3 o'clock (Active)  11/09/22 0700  Location: Buttocks  Location Orientation: Right  Staging: Stage 3 -  Full thickness tissue loss. Subcutaneous fat may be visible but bone, tendon or muscle are NOT exposed.  Wound Description (Comments): 2 cm x 3 cm x 1 cm w/1.4 cm undermining from 12-3 o'clock  Present on Admission: Yes  Dressing Type Gauze (Comment) 11/24/22 0700     Pressure Injury 11/16/22 Elbow Anterior;Left Wound from arm contracture to elbow crevice, stage 2/ skin tear (Active)  11/16/22 2000  Location: Elbow  Location Orientation: Anterior;Left  Staging:   Wound Description (Comments): Wound from arm contracture to elbow crevice, stage 2/ skin tear  Present on Admission: Yes  Dressing Type  None 11/24/22 0700    Consultants:  Palliative care  Procedures:  None  Antimicrobials:  Anti-infectives (From admission, onward)    Start     Dose/Rate Route Frequency Ordered Stop   11/20/22 1400  piperacillin-tazobactam (ZOSYN) IVPB 3.375 g        3.375 g 12.5 mL/hr over 240 Minutes Intravenous Every 8 hours 11/20/22 1251 11/25/22 1359         Subjective: Patient seen and examined today.  She is more talkative and alert today.  She said she just wants to go home.  She had no complaints.  Objective: Vitals:   11/23/22 1648 11/23/22 2123 11/24/22 0552 11/24/22 0733  BP: 138/88 136/85 126/77 129/73  Pulse: 96 98 92 95  Resp: 18 18 16 19   Temp:  97.6 F (36.4 C) 98.9 F (37.2 C) 98.2 F (36.8 C)  TempSrc:      SpO2: 100% 100% 100% 100%  Weight:        Intake/Output Summary (Last 24 hours) at 11/24/2022 1022 Last data filed at 11/24/2022 0140 Gross per 24 hour  Intake --  Output 1000 ml  Net -1000 ml    Filed Weights   11/16/22 1911 11/17/22 0420  Weight: 57.1 kg 57.1 kg    Examination:  General exam: Appears calm and comfortable  Respiratory system: Clear to auscultation. Respiratory effort normal. Cardiovascular system: S1 & S2 heard, RRR. No JVD, murmurs, rubs, gallops or clicks. No pedal edema. Gastrointestinal system: Abdomen is nondistended, soft and nontender. No organomegaly or masses felt. Normal bowel sounds heard. Central nervous system: Alert but not oriented.  Quadriplegic.  Data Reviewed: I have personally reviewed following labs and imaging studies  CBC: Recent Labs  Lab 11/19/22 0422 11/20/22 0842 11/22/22 0811  WBC 19.6* 24.8* 16.6*  NEUTROABS 13.8* 20.4* 13.6*  HGB 13.1 14.2 12.6  HCT 42.6 45.1 40.3  MCV 84.4 84.0 82.8  PLT 456* PLATELET CLUMPS NOTED ON SMEAR, UNABLE TO ESTIMATE 367    Basic Metabolic Panel: Recent Labs  Lab 11/19/22 0417 11/19/22 0422 11/20/22 0842 11/22/22 0811  NA  --  141 145 141  K  --  2.9* 3.8 2.4*   CL  --  118* 122* 116*  CO2  --  12* 9* 15*  GLUCOSE  --  101* 99 120*  BUN  --  20 19 14   CREATININE  --  0.81 0.88 0.75  CALCIUM  --  8.6* 8.8* 8.3*  MG 2.1  --   --   --     GFR: Estimated Creatinine Clearance: 55.7 mL/min (by C-G formula based on SCr of 0.75 mg/dL). Liver Function Tests: No results  for input(s): "AST", "ALT", "ALKPHOS", "BILITOT", "PROT", "ALBUMIN" in the last 168 hours.  No results for input(s): "LIPASE", "AMYLASE" in the last 168 hours. No results for input(s): "AMMONIA" in the last 168 hours. Coagulation Profile: No results for input(s): "INR", "PROTIME" in the last 168 hours. Cardiac Enzymes: No results for input(s): "CKTOTAL", "CKMB", "CKMBINDEX", "TROPONINI" in the last 168 hours. BNP (last 3 results) No results for input(s): "PROBNP" in the last 8760 hours. HbA1C: Recent Labs    11/22/22 0959  HGBA1C 5.2    CBG: Recent Labs  Lab 11/23/22 1153 11/23/22 1647 11/23/22 2127 11/24/22 0129 11/24/22 0734  GLUCAP 143* 128* 117* 97 84    Lipid Profile: No results for input(s): "CHOL", "HDL", "LDLCALC", "TRIG", "CHOLHDL", "LDLDIRECT" in the last 72 hours. Thyroid Function Tests: No results for input(s): "TSH", "T4TOTAL", "FREET4", "T3FREE", "THYROIDAB" in the last 72 hours. Anemia Panel: No results for input(s): "VITAMINB12", "FOLATE", "FERRITIN", "TIBC", "IRON", "RETICCTPCT" in the last 72 hours. Sepsis Labs: Recent Labs  Lab 11/20/22 1404  LATICACIDVEN 1.4     Recent Results (from the past 240 hour(s))  MRSA Next Gen by PCR, Nasal     Status: Abnormal   Collection Time: 11/16/22  7:56 PM   Specimen: Nasal Mucosa; Nasal Swab  Result Value Ref Range Status   MRSA by PCR Next Gen DETECTED (A) NOT DETECTED Final    Comment: RESULT CALLED TO, READ BACK BY AND VERIFIED WITH: RN TESSA PETERS ON 11/16/22 @ 2137 BY DRT (NOTE) The GeneXpert MRSA Assay (FDA approved for NASAL specimens only), is one component of a comprehensive MRSA  colonization surveillance program. It is not intended to diagnose MRSA infection nor to guide or monitor treatment for MRSA infections. Test performance is not FDA approved in patients less than 57 years old. Performed at [facility], [address], [place], [place] [address]   Urine Culture     Status: None   Collection Time: 11/16/22 11:26 PM   Specimen: Urine, Random  Result Value Ref Range Status   Specimen Description URINE, RANDOM  Final   Special Requests NONE Reflexed from [id number]  Final   Culture   Final    NO GROWTH Performed at [facility], [address], [place], [place] [address]    Report Status 11/18/2022 FINAL  Final  Culture, blood (Routine X 2) w Reflex to ID Panel     Status: None (Preliminary result)   Collection Time: 11/20/22  2:06 PM   Specimen: BLOOD RIGHT HAND  Result Value Ref Range Status   Specimen Description BLOOD RIGHT HAND  Final   Special Requests   Final    BOTTLES DRAWN AEROBIC ONLY Blood Culture results may not be optimal due to an inadequate volume of blood received in culture bottles   Culture   Final    NO GROWTH 3 DAYS Performed at [facility], [address], [place], [place] [address]    Report Status PENDING  Incomplete  Culture, blood (Routine X 2) w Reflex to ID Panel     Status: None (Preliminary result)   Collection Time: 11/20/22  2:07 PM   Specimen: BLOOD RIGHT HAND  Result Value Ref Range Status   Specimen Description BLOOD RIGHT HAND  Final   Special Requests   Final    BOTTLES DRAWN AEROBIC ONLY Blood Culture results may not be optimal due to an inadequate volume of blood received in culture bottles   Culture  Final    NO GROWTH 3 DAYS Performed at [facility], [address] [place], [place] [address]    Report Status PENDING  Incomplete  Remove and replace urinary cath (placed > 5 days) then obtain urine culture from new indwelling urinary catheter.     Status: None (Preliminary result)    Collection Time: 11/22/22  9:23 AM   Specimen: Urine, Catheterized  Result Value Ref Range Status   Specimen Description URINE, CATHETERIZED  Final   Special Requests NONE  Final   Culture   Final    CULTURE REINCUBATED FOR BETTER GROWTH Performed at [facility], [address] [place], [place] [address]    Report Status PENDING  Incomplete     Radiology Studies: No results found.  Scheduled Meds:  apixaban  5 mg Oral BID   carvedilol  3.125 mg Oral BID WC   insulin aspart  0-5 Units Subcutaneous QHS   insulin aspart  0-9 Units Subcutaneous TID WC   mupirocin ointment  1 Application Nasal BID   sacubitril-valsartan  1 tablet Oral BID   Continuous Infusions:  lacosamide (VIMPAT) IV 50 mg (11/24/22 0008)   levETIRAcetam 1,500 mg (11/24/22 0951)   piperacillin-tazobactam (ZOSYN)  IV 3.375 g (11/24/22 0548)     LOS: 8 days   Hughie Closs, MD [facility]  11/24/2022, 10:22 AM   *Please note that this is a verbal dictation therefore any spelling or grammatical errors are due to the "Dragon Medical One" system interpretation.  Please page via Amion and do not message via secure chat for urgent patient care matters. Secure chat can be used for non urgent patient care matters.  How to contact the [facility] Attending or Consulting provider 7A - 7P or covering provider during after hours 7P -7A, for this patient?  Check the care team in [facility] and look for a) attending/consulting TRH provider listed and b) the [facility] team listed. Page or secure chat 7A-7P. Log into [email or web address] and use [facility] universal password to access. If you do not have the password, please contact the hospital operator. Locate the [facility] provider you are looking for under [facility] and page to a number that you can be directly reached. If you still have difficulty reaching the provider, please page the [facility] (Director on Call) for the Hospitalists listed on amion for assistance.

## 2022-11-24 NOTE — TOC Progression Note (Addendum)
Transition of Care [facility]) - Progression Note    Patient Details  Name: Jocelyn Sanchez MRN: [id number] Date of Birth: Oct 10, 1955  Transition [facility] CM/SW Contact  Dellie Burns [place], [place] Phone Number: 11/24/2022, 9:22 AM  Clinical Narrative:  Current bed offers provided to pt's sister Jocelyn Sanchez who will discuss with her brothers and update SW with choice. Will require insurance auth once SNF choice is made. SW will provide updates as available.    UPDATE: spoke to pt's sister Jocelyn Sanchez who has accepted [facility]. Notified Kia in admissions at [facility] of acceptance. CMA to start [facility] auth. [facility] is able to accept over weekend if auth received. SW will follow.   Dellie Burns, MSW, LCSW [phone] (coverage)        Expected Discharge Plan: Skilled Nursing Facility Barriers to Discharge: Insurance Authorization, SNF Pending bed offer  Expected Discharge Plan and Services   Discharge Planning Services: CM Consult Post Acute Care Choice: Resumption of Svcs/PTA Provider Living arrangements for the past 2 months: Single Family Home                             HH Agency: [facility]     Representative spoke with at [facility] Agency: Patient is currently active with [facility] health for RN for wound care for chronic sacral wound   Social Determinants of Health (SDOH) Interventions SDOH Screenings   Housing: Patient Unable To Answer (11/09/2022)  Tobacco Use: Medium Risk (11/13/2022)    Readmission Risk Interventions    11/20/2022   12:01 PM 11/14/2022    2:15 PM  Readmission Risk Prevention Plan  Transportation Screening Complete Complete  PCP or Specialist Appt within 5-7 Days Complete Complete  Home Care Screening Complete Complete  Medication Review (RN CM) Complete Complete

## 2022-11-24 NOTE — Progress Notes (Signed)
Ok to add stop date of 5d for zosyn per Dr Jacqulyn Bath.  Ulyses Southward, PharmD, BCIDP, AAHIVP, CPP Infectious Disease Pharmacist 11/24/2022 9:02 AM

## 2022-11-25 DIAGNOSIS — R569 Unspecified convulsions: Secondary | ICD-10-CM | POA: Diagnosis not present

## 2022-11-25 LAB — GLUCOSE, CAPILLARY
Glucose-Capillary: 108 mg/dL — ABNORMAL HIGH (ref 70–99)
Glucose-Capillary: 108 mg/dL — ABNORMAL HIGH (ref 70–99)
Glucose-Capillary: 162 mg/dL — ABNORMAL HIGH (ref 70–99)
Glucose-Capillary: 146 mg/dL — ABNORMAL HIGH (ref 70–99)

## 2022-11-25 LAB — CULTURE, BLOOD (ROUTINE X 2)
Culture: NO GROWTH
Culture: NO GROWTH

## 2022-11-25 MED ORDER — LACOSAMIDE 50 MG PO TABS
50.0000 mg | ORAL_TABLET | Freq: Two times a day (BID) | ORAL | Status: DC
  Administered 2022-11-25 – 2022-12-04 (×18): 50 mg via ORAL
  Filled 2022-11-25 (×18): qty 1

## 2022-11-25 MED ORDER — LEVETIRACETAM 500 MG PO TABS
1500.0000 mg | ORAL_TABLET | Freq: Two times a day (BID) | ORAL | Status: DC
  Administered 2022-11-25 – 2022-12-04 (×18): 1500 mg via ORAL
  Filled 2022-11-25 (×18): qty 3

## 2022-11-25 NOTE — Progress Notes (Signed)
PROGRESS NOTE    Jocelyn Sanchez  ZOX:[id number] DOB: [birth year] DOA: 11/16/2022 PCP: Bethanie Dicker, FNP   Brief Narrative:  Jocelyn Sanchez is a 67 y.o. female with PMH significant for multiple sclerosis with quadriplegia/bedbound status, chronic sacral ulcer, prior PE on Eliquis, anemia, DM2, hypertension, lymphedema.   5/30, patient was brought to the ED by EMS with seizures. No history of seizure,.  Family noted patient was unwell, not responding and foaming from her mouth.. EMS noted rhythmic shaking of all extremities as well as horizontal rhythmic moving of eyes.  She was given IM Versed.  Remained tachycardic and had notable pinpoint pupil.  Narcan was given without improvement.  Patient was given BMV ventilation and brought to the ED.   DNR   CT head with asymmetric edema in the left cerebral hemisphere possibly secondary to postictal state, but early large left MCA territory infarct is also possible.  Started on Keppra.  Neurology consulted Admitted to ICU for status epilepticus 5/31, patient had prolonged seizure and EEG.   5/31, PCCM Dr. Tonia Brooms had an extensive bedside discussion with patient's family regarding her current circumstances, poor prognosis and poor quality of life.  Family made a choice to transition her to full comfort care Transferred to [facility]  Assessment & Plan:   Principal Problem:   Seizure [facility]) Active Problems:   Acute metabolic encephalopathy  New onset seizure / Status epilepticus Primarily admitted for seizure.   AEDs to continue to prevent further seizures Currently on Keppra, Vimpat and as needed Versed IV.  No reports of problem with swallowing so I will now switch her Keppra impaired to oral solutions.   Acute metabolic encephalopathy Family states he had baseline normal cognitive function.   Patient had prolonged seizure leading to prolonged postictal state and altered mental status. Family has noticed gradual improvement and is hopeful for  meaningful recovery.  Per notes from previous hospitalist, she was only able to open eyes on commands but mumbles.  Since I assumed her care on 11/22/2022, I have seen improvement as well.  Patient's speech is comprehensible although it is hard for patient to understand questions and answer appropriately.  At times she says, " I just want to go home"   Multiple sclerosis with quadriplegia/bedbound status Chronic sacral ulcer with hx of quadriplegia (POA) Continue supportive care Local would care  Pressure alleviating devices    SIRS secondary to presumed UTI, patient with history of ESBL UTI 11/08/2022, treated with Zosyn. 6/3, patient was tachycardic, WBC count was elevated.  Urinalysis showed turbid yellow urine with positive nitrate, large hemoglobin, moderate leukocytes and many bacteria.  She was started on Zosyn for the treatment of UTI.  Urine culture was not sent.  I ordered urine culture but it was never sent again.  Patient completed 5 days of Zosyn.  Hypokalemia Resolved.  History of PE: Continue Eliquis.  H/o combined systolic and diastolic CHF/ Essential hypertension Stable.  Continue home medications which is Coreg and Entresto.     Type 2 diabetes Last hemoglobin A1c documented in the chart was 3 years ago which is 6.7.  Hemoglobin A1c at this time was 5.2.  Blood sugar very well-controlled.  She has not required any insulin per SSI.  Will discontinue that.  Goal of care: Family meeting took place with palliative care on 11/22/2022, they decided in favor of no artificial feeding or hydration now or in the future, comfort feeds as tolerated.  Family is hopeful for SNF for long-term  care and palliative services.  They are not interested in hospice at this time.  Appreciate palliative care help.  DVT prophylaxis: Eliquis   Code Status: DNR  Family Communication:  None present at bedside.  Palliative care in constant communication with family.  Status is: Inpatient Remains  inpatient appropriate because: Medically stable now.  Pending placement to SNF for long-term care.  TOC working on it.  Waiting for insurance authorization.   Estimated body mass index is 22.3 kg/m as calculated from the following:   Height as of 11/08/22: 5\' 3"  (1.6 m).   Weight as of this encounter: 57.1 kg.  Pressure Injury 11/09/22 Heel Left Deep Tissue Pressure Injury - Purple or maroon localized area of discolored intact skin or blood-filled blister due to damage of underlying soft tissue from pressure and/or shear. 2 cm x 3 cm (Active)  11/09/22 0700  Location: Heel  Location Orientation: Left  Staging: Deep Tissue Pressure Injury - Purple or maroon localized area of discolored intact skin or blood-filled blister due to damage of underlying soft tissue from pressure and/or shear.  Wound Description (Comments): 2 cm x 3 cm  Present on Admission:   Dressing Type Foam - Lift dressing to assess site every shift 11/23/22 2000     Pressure Injury 11/09/22 Buttocks Right Stage 3 -  Full thickness tissue loss. Subcutaneous fat may be visible but bone, tendon or muscle are NOT exposed. 2 cm x 3 cm x 1 cm w/1.4 cm undermining from 12-3 o'clock (Active)  11/09/22 0700  Location: Buttocks  Location Orientation: Right  Staging: Stage 3 -  Full thickness tissue loss. Subcutaneous fat may be visible but bone, tendon or muscle are NOT exposed.  Wound Description (Comments): 2 cm x 3 cm x 1 cm w/1.4 cm undermining from 12-3 o'clock  Present on Admission: Yes  Dressing Type Gauze (Comment);Moist to dry 11/25/22 0545     Pressure Injury 11/16/22 Elbow Anterior;Left Wound from arm contracture to elbow crevice, stage 2/ skin tear (Active)  11/16/22 2000  Location: Elbow  Location Orientation: Anterior;Left  Staging:   Wound Description (Comments): Wound from arm contracture to elbow crevice, stage 2/ skin tear  Present on Admission: Yes  Dressing Type None 11/24/22 0800   Nutritional  Assessment: Body mass index is 22.3 kg/m.Marland Kitchen Seen by dietician.  I agree with the assessment and plan as outlined below: Nutrition Status:        . Skin Assessment: I have examined the patient's skin and I agree with the wound assessment as performed by the wound care RN as outlined below: Pressure Injury 11/09/22 Heel Left Deep Tissue Pressure Injury - Purple or maroon localized area of discolored intact skin or blood-filled blister due to damage of underlying soft tissue from pressure and/or shear. 2 cm x 3 cm (Active)  11/09/22 0700  Location: Heel  Location Orientation: Left  Staging: Deep Tissue Pressure Injury - Purple or maroon localized area of discolored intact skin or blood-filled blister due to damage of underlying soft tissue from pressure and/or shear.  Wound Description (Comments): 2 cm x 3 cm  Present on Admission:   Dressing Type Foam - Lift dressing to assess site every shift 11/23/22 2000     Pressure Injury 11/09/22 Buttocks Right Stage 3 -  Full thickness tissue loss. Subcutaneous fat may be visible but bone, tendon or muscle are NOT exposed. 2 cm x 3 cm x 1 cm w/1.4 cm undermining from 12-3 o'clock (Active)  11/09/22 0700  Location: Buttocks  Location Orientation: Right  Staging: Stage 3 -  Full thickness tissue loss. Subcutaneous fat may be visible but bone, tendon or muscle are NOT exposed.  Wound Description (Comments): 2 cm x 3 cm x 1 cm w/1.4 cm undermining from 12-3 o'clock  Present on Admission: Yes  Dressing Type Gauze (Comment);Moist to dry 11/25/22 0545     Pressure Injury 11/16/22 Elbow Anterior;Left Wound from arm contracture to elbow crevice, stage 2/ skin tear (Active)  11/16/22 2000  Location: Elbow  Location Orientation: Anterior;Left  Staging:   Wound Description (Comments): Wound from arm contracture to elbow crevice, stage 2/ skin tear  Present on Admission: Yes  Dressing Type None 11/24/22 0800    Consultants:  Palliative  care  Procedures:  None  Antimicrobials:  Anti-infectives (From admission, onward)    Start     Dose/Rate Route Frequency Ordered Stop   11/20/22 1400  piperacillin-tazobactam (ZOSYN) IVPB 3.375 g        3.375 g 12.5 mL/hr over 240 Minutes Intravenous Every 8 hours 11/20/22 1251 11/25/22 0904         Subjective: Patient seen and examined.  No complaints.  Slightly sleepy today.  Objective: Vitals:   11/24/22 1630 11/24/22 2055 11/25/22 0424 11/25/22 0812  BP:  124/80 119/74 (!) 140/88  Pulse: 90 96 87 96  Resp:  16 16 15   Temp:  99.1 F (37.3 C) 98.6 F (37 C) 97.9 F (36.6 C)  TempSrc:      SpO2:  100% 100% 100%  Weight:        Intake/Output Summary (Last 24 hours) at 11/25/2022 1100 Last data filed at 11/25/2022 1038 Gross per 24 hour  Intake 240 ml  Output 2150 ml  Net -1910 ml    Filed Weights   11/16/22 1911 11/17/22 0420  Weight: 57.1 kg 57.1 kg    Examination:  General exam: Appears calm and comfortable  Respiratory system: Clear to auscultation. Respiratory effort normal. Cardiovascular system: S1 & S2 heard, RRR. No JVD, murmurs, rubs, gallops or clicks. No pedal edema. Gastrointestinal system: Abdomen is nondistended, soft and nontender. No organomegaly or masses felt. Normal bowel sounds heard. Central nervous system: Alert but not oriented.  Quadriplegic.  Data Reviewed: I have personally reviewed following labs and imaging studies  CBC: Recent Labs  Lab 11/19/22 0422 11/20/22 0842 11/22/22 0811 11/24/22 1058  WBC 19.6* 24.8* 16.6* 10.3  NEUTROABS 13.8* 20.4* 13.6* 7.5  HGB 13.1 14.2 12.6 12.1  HCT 42.6 45.1 40.3 39.7  MCV 84.4 84.0 82.8 86.7  PLT 456* PLATELET CLUMPS NOTED ON SMEAR, UNABLE TO ESTIMATE 367 510*    Basic Metabolic Panel: Recent Labs  Lab 11/19/22 0417 11/19/22 0422 11/20/22 0842 11/22/22 0811 11/24/22 1058  NA  --  141 145 141 138  K  --  2.9* 3.8 2.4* 4.1  CL  --  118* 122* 116* 114*  CO2  --  12* 9* 15* 15*   GLUCOSE  --  101* 99 120* 116*  BUN  --  20 19 14 10   CREATININE  --  0.81 0.88 0.75 0.65  CALCIUM  --  8.6* 8.8* 8.3* 8.3*  MG 2.1  --   --   --   --     GFR: Estimated Creatinine Clearance: 55.7 mL/min (by C-G formula based on SCr of 0.65 mg/dL). Liver Function Tests: No results for input(s): "AST", "ALT", "ALKPHOS", "BILITOT", "PROT", "ALBUMIN" in the last 168 hours.  No results for  input(s): "LIPASE", "AMYLASE" in the last 168 hours. No results for input(s): "AMMONIA" in the last 168 hours. Coagulation Profile: No results for input(s): "INR", "PROTIME" in the last 168 hours. Cardiac Enzymes: No results for input(s): "CKTOTAL", "CKMB", "CKMBINDEX", "TROPONINI" in the last 168 hours. BNP (last 3 results) No results for input(s): "PROBNP" in the last 8760 hours. HbA1C: No results for input(s): "HGBA1C" in the last 72 hours.  CBG: Recent Labs  Lab 11/24/22 0734 11/24/22 1111 11/24/22 1620 11/24/22 2200 11/25/22 0941  GLUCAP 84 124* 179* 93 108*    Lipid Profile: No results for input(s): "CHOL", "HDL", "LDLCALC", "TRIG", "CHOLHDL", "LDLDIRECT" in the last 72 hours. Thyroid Function Tests: No results for input(s): "TSH", "T4TOTAL", "FREET4", "T3FREE", "THYROIDAB" in the last 72 hours. Anemia Panel: No results for input(s): "VITAMINB12", "FOLATE", "FERRITIN", "TIBC", "IRON", "RETICCTPCT" in the last 72 hours. Sepsis Labs: Recent Labs  Lab 11/20/22 1404  LATICACIDVEN 1.4     Recent Results (from the past 240 hour(s))  MRSA Next Gen by PCR, Nasal     Status: Abnormal   Collection Time: 11/16/22  7:56 PM   Specimen: Nasal Mucosa; Nasal Swab  Result Value Ref Range Status   MRSA by PCR Next Gen DETECTED (A) NOT DETECTED Final    Comment: RESULT CALLED TO, READ BACK BY AND VERIFIED WITH: RN TESSA PETERS ON 11/16/22 @ 2137 BY DRT (NOTE) The GeneXpert MRSA Assay (FDA approved for NASAL specimens only), is one component of a comprehensive MRSA colonization  surveillance program. It is not intended to diagnose MRSA infection nor to guide or monitor treatment for MRSA infections. Test performance is not FDA approved in patients less than 75 years old. Performed at [facility], [address], [place], [place] [address]   Urine Culture     Status: None   Collection Time: 11/16/22 11:26 PM   Specimen: Urine, Random  Result Value Ref Range Status   Specimen Description URINE, RANDOM  Final   Special Requests NONE Reflexed from [id number]  Final   Culture   Final    NO GROWTH Performed at [facility], [address] [place], [place] [address]    Report Status 11/18/2022 FINAL  Final  Culture, blood (Routine X 2) w Reflex to ID Panel     Status: None   Collection Time: 11/20/22  2:06 PM   Specimen: BLOOD RIGHT HAND  Result Value Ref Range Status   Specimen Description BLOOD RIGHT HAND  Final   Special Requests   Final    BOTTLES DRAWN AEROBIC ONLY Blood Culture results may not be optimal due to an inadequate volume of blood received in culture bottles   Culture   Final    NO GROWTH 5 DAYS Performed at [facility], [address] [place], [place] [address]    Report Status 11/25/2022 FINAL  Final  Culture, blood (Routine X 2) w Reflex to ID Panel     Status: None   Collection Time: 11/20/22  2:07 PM   Specimen: BLOOD RIGHT HAND  Result Value Ref Range Status   Specimen Description BLOOD RIGHT HAND  Final   Special Requests   Final    BOTTLES DRAWN AEROBIC ONLY Blood Culture results may not be optimal due to an inadequate volume of blood received in culture bottles   Culture   Final    NO GROWTH 5 DAYS Performed at [facility], [address], [place], [place] [address]  Report Status 11/25/2022 FINAL  Final  Remove and replace urinary cath (placed > 5 days) then obtain urine culture from new indwelling urinary catheter.     Status: Abnormal (Preliminary result)   Collection Time: 11/22/22  9:23 AM    Specimen: Urine, Catheterized  Result Value Ref Range Status   Specimen Description URINE, CATHETERIZED  Final   Special Requests NONE  Final   Culture (A)  Final    5,000 COLONIES/mL ESCHERICHIA COLI CONFIRMATION OF SUSCEPTIBILITIES IN PROGRESS Performed at [facility], [address] [place], [place] [address]    Report Status PENDING  Incomplete     Radiology Studies: No results found.  Scheduled Meds:  apixaban  5 mg Oral BID   carvedilol  3.125 mg Oral BID WC   insulin aspart  0-5 Units Subcutaneous QHS   insulin aspart  0-9 Units Subcutaneous TID WC   mupirocin ointment  1 Application Nasal BID   sacubitril-valsartan  1 tablet Oral BID   Continuous Infusions:  lacosamide (VIMPAT) IV 50 mg (11/25/22 1005)   levETIRAcetam 1,500 mg (11/25/22 0944)     LOS: 9 days   Hughie Closs, MD [facility]  11/25/2022, 11:00 AM   *Please note that this is a verbal dictation therefore any spelling or grammatical errors are due to the "[employer]agon [employer] One" system interpretation.  Please page via Amion and do not message via secure chat for urgent patient care matters. Secure chat can be used for non urgent patient care matters.  How to contact the [facility] Attending or Consulting provider 7A - 7P or covering provider during after hours 7P -7A, for this patient?  Check the care team in [facility] and look for a) attending/consulting [facility] provider listed and b) the [facility] team listed. Page or secure chat [phone]-7P. Log into [email or web address] and use [facility]'s universal password to access. If you do not have the password, please contact the hospital operator. Locate the [facility] provider you are looking for under [facility] and page to a number that you can be directly reached. If you still have difficulty reaching the provider, please page the [facility] (Director on Call) for the Hospitalists listed on amion for assistance.

## 2022-11-26 DIAGNOSIS — R569 Unspecified convulsions: Secondary | ICD-10-CM | POA: Diagnosis not present

## 2022-11-26 LAB — URINE CULTURE

## 2022-11-26 LAB — GLUCOSE, CAPILLARY
Glucose-Capillary: 135 mg/dL — ABNORMAL HIGH (ref 70–99)
Glucose-Capillary: 133 mg/dL — ABNORMAL HIGH (ref 70–99)
Glucose-Capillary: 148 mg/dL — ABNORMAL HIGH (ref 70–99)
Glucose-Capillary: 136 mg/dL — ABNORMAL HIGH (ref 70–99)

## 2022-11-26 LAB — BASIC METABOLIC PANEL
Anion gap: 7 (ref 5–15)
BUN: 11 mg/dL (ref 8–23)
CO2: 15 mmol/L — ABNORMAL LOW (ref 22–32)
Calcium: 8.3 mg/dL — ABNORMAL LOW (ref 8.9–10.3)
Chloride: 114 mmol/L — ABNORMAL HIGH (ref 98–111)
Creatinine, Ser: 0.67 mg/dL (ref 0.44–1.00)
GFR, Estimated: >60 mL/min (ref >60)
Glucose, Bld: 114 mg/dL — ABNORMAL HIGH (ref 70–99)
Potassium: 4.4 mmol/L (ref 3.5–5.1)
Sodium: 136 mmol/L (ref 135–145)

## 2022-11-26 NOTE — Progress Notes (Signed)
PROGRESS NOTE    Jocelyn Sanchez  WUJ:[id number] DOB: 06/15/1956 DOA: 11/16/2022 PCP: Bethanie Dicker, FNP   Brief Narrative:  Jocelyn Sanchez is a 67 y.o. female with PMH significant for multiple sclerosis with quadriplegia/bedbound status, chronic sacral ulcer, prior PE on Eliquis, anemia, DM2, hypertension, lymphedema.   5/30, patient was brought to the ED by EMS with seizures. No history of seizure,.  Family noted patient was unwell, not responding and foaming from her mouth.. EMS noted rhythmic shaking of all extremities as well as horizontal rhythmic moving of eyes.  She was given IM Versed.  Remained tachycardic and had notable pinpoint pupil.  Narcan was given without improvement.  Patient was given BMV ventilation and brought to the ED.   DNR   CT head with asymmetric edema in the left cerebral hemisphere possibly secondary to postictal state, but early large left MCA territory infarct is also possible.  Started on Keppra.  Neurology consulted Admitted to ICU for status epilepticus 5/31, patient had prolonged seizure and EEG.   5/31, PCCM Dr. Tonia Brooms had an extensive bedside discussion with patient's family regarding her current circumstances, poor prognosis and poor quality of life.  Family made a choice to transition her to full comfort care Transferred to [facility]  Assessment & Plan:   Principal Problem:   Seizure [facility]) Active Problems:   Acute metabolic encephalopathy  New onset seizure / Status epilepticus Primarily admitted for seizure.   AEDs to continue to prevent further seizures Currently on oral Keppra, Vimpat and as needed Versed IV.  No reports of problem with swallowing.   Acute metabolic encephalopathy Family states he had baseline normal cognitive function.   Patient had prolonged seizure leading to prolonged postictal state and altered mental status. Family has noticed gradual improvement and is hopeful for meaningful recovery.  Per notes from previous  hospitalist, she was only able to open eyes on commands but mumbles.  Since I assumed her care on 11/22/2022, patient has been gradually improving.  Today she appears to be totally back at baseline.  She asked very reasonable questions today.   Multiple sclerosis with quadriplegia/bedbound status Chronic sacral ulcer with hx of quadriplegia (POA) Continue supportive care Local would care  Pressure alleviating devices    SIRS secondary to presumed UTI, patient with history of ESBL UTI 11/08/2022, treated with Zosyn. 6/3, patient was tachycardic, WBC count was elevated.  Urinalysis showed turbid yellow urine with positive nitrate, large hemoglobin, moderate leukocytes and many bacteria.  She was started on Zosyn for the treatment of UTI.  Urine culture was not sent.  I ordered urine culture but it was never sent again.  Patient completed 5 days of Zosyn.  Hypokalemia Resolved.  History of PE: Continue Eliquis.  H/o combined systolic and diastolic CHF/ Essential hypertension Stable.  Continue home medications which is Coreg and Entresto.     Type 2 diabetes Last hemoglobin A1c documented in the chart was 3 years ago which is 6.7.  Hemoglobin A1c at this time was 5.2.  Blood sugar very well-controlled.  She has not required any insulin per SSI.  Will discontinue that.  Goal of care: Family meeting took place with palliative care on 11/22/2022, they decided in favor of no artificial feeding or hydration now or in the future, comfort feeds as tolerated.  Family is hopeful for SNF for long-term care and palliative services.  They are not interested in hospice at this time.  Appreciate palliative care help.  DVT prophylaxis: Eliquis  Code Status: DNR  Family Communication:  None present at bedside.  Palliative care in constant communication with family.  Status is: Inpatient Remains inpatient appropriate because: Medically stable now.  Pending placement to SNF for long-term care.  TOC working on it.   Waiting for insurance authorization.   Estimated body mass index is 22.3 kg/m as calculated from the following:   Height as of 11/08/22: 5\' 3"  (1.6 m).   Weight as of this encounter: 57.1 kg.  Pressure Injury 11/09/22 Heel Left Deep Tissue Pressure Injury - Purple or maroon localized area of discolored intact skin or blood-filled blister due to damage of underlying soft tissue from pressure and/or shear. 2 cm x 3 cm (Active)  11/09/22 0700  Location: Heel  Location Orientation: Left  Staging: Deep Tissue Pressure Injury - Purple or maroon localized area of discolored intact skin or blood-filled blister due to damage of underlying soft tissue from pressure and/or shear.  Wound Description (Comments): 2 cm x 3 cm  Present on Admission:   Dressing Type Foam - Lift dressing to assess site every shift 11/25/22 2030     Pressure Injury 11/09/22 Buttocks Right Stage 3 -  Full thickness tissue loss. Subcutaneous fat may be visible but bone, tendon or muscle are NOT exposed. 2 cm x 3 cm x 1 cm w/1.4 cm undermining from 12-3 o'clock (Active)  11/09/22 0700  Location: Buttocks  Location Orientation: Right  Staging: Stage 3 -  Full thickness tissue loss. Subcutaneous fat may be visible but bone, tendon or muscle are NOT exposed.  Wound Description (Comments): 2 cm x 3 cm x 1 cm w/1.4 cm undermining from 12-3 o'clock  Present on Admission: Yes  Dressing Type Gauze (Comment);Moist to dry 11/25/22 2030     Pressure Injury 11/16/22 Elbow Anterior;Left Wound from arm contracture to elbow crevice, stage 2/ skin tear (Active)  11/16/22 2000  Location: Elbow  Location Orientation: Anterior;Left  Staging:   Wound Description (Comments): Wound from arm contracture to elbow crevice, stage 2/ skin tear  Present on Admission: Yes  Dressing Type None 11/25/22 2030   Nutritional Assessment: Body mass index is 22.3 kg/m.Marland Kitchen Seen by dietician.  I agree with the assessment and plan as outlined  below: Nutrition Status:        . Skin Assessment: I have examined the patient's skin and I agree with the wound assessment as performed by the wound care RN as outlined below: Pressure Injury 11/09/22 Heel Left Deep Tissue Pressure Injury - Purple or maroon localized area of discolored intact skin or blood-filled blister due to damage of underlying soft tissue from pressure and/or shear. 2 cm x 3 cm (Active)  11/09/22 0700  Location: Heel  Location Orientation: Left  Staging: Deep Tissue Pressure Injury - Purple or maroon localized area of discolored intact skin or blood-filled blister due to damage of underlying soft tissue from pressure and/or shear.  Wound Description (Comments): 2 cm x 3 cm  Present on Admission:   Dressing Type Foam - Lift dressing to assess site every shift 11/25/22 2030     Pressure Injury 11/09/22 Buttocks Right Stage 3 -  Full thickness tissue loss. Subcutaneous fat may be visible but bone, tendon or muscle are NOT exposed. 2 cm x 3 cm x 1 cm w/1.4 cm undermining from 12-3 o'clock (Active)  11/09/22 0700  Location: Buttocks  Location Orientation: Right  Staging: Stage 3 -  Full thickness tissue loss. Subcutaneous fat may be visible but bone, tendon  or muscle are NOT exposed.  Wound Description (Comments): 2 cm x 3 cm x 1 cm w/1.4 cm undermining from 12-3 o'clock  Present on Admission: Yes  Dressing Type Gauze (Comment);Moist to dry 11/25/22 2030     Pressure Injury 11/16/22 Elbow Anterior;Left Wound from arm contracture to elbow crevice, stage 2/ skin tear (Active)  11/16/22 2000  Location: Elbow  Location Orientation: Anterior;Left  Staging:   Wound Description (Comments): Wound from arm contracture to elbow crevice, stage 2/ skin tear  Present on Admission: Yes  Dressing Type None 11/25/22 2030    Consultants:  Palliative care  Procedures:  None  Antimicrobials:  Anti-infectives (From admission, onward)    Start     Dose/Rate Route Frequency  Ordered Stop   11/20/22 1400  piperacillin-tazobactam (ZOSYN) IVPB 3.375 g        3.375 g 12.5 mL/hr over 240 Minutes Intravenous Every 8 hours 11/20/22 1251 11/25/22 0904         Subjective: Patient seen and examined.  No complaints.  Fully alert and oriented.  Objective: Vitals:   11/25/22 1543 11/25/22 1957 11/26/22 0424 11/26/22 0806  BP: 136/88 (!) 149/98 139/80 (!) 141/85  Pulse: 80 98 100 99  Resp: 15 16 16 18   Temp: 97.6 F (36.4 C) 97.6 F (36.4 C) 98.4 F (36.9 C) 97.7 F (36.5 C)  TempSrc:      SpO2: 100% 100% 100% 100%  Weight:        Intake/Output Summary (Last 24 hours) at 11/26/2022 0933 Last data filed at 11/26/2022 0835 Gross per 24 hour  Intake 898 ml  Output 1150 ml  Net -252 ml    Filed Weights   11/16/22 1911 11/17/22 0420  Weight: 57.1 kg 57.1 kg    Examination:  General exam: Appears calm and comfortable  Respiratory system: Clear to auscultation. Respiratory effort normal. Cardiovascular system: S1 & S2 heard, RRR. No JVD, murmurs, rubs, gallops or clicks. No pedal edema. Gastrointestinal system: Abdomen is nondistended, soft and nontender. No organomegaly or masses felt. Normal bowel sounds heard. Central nervous system: Fully alert and oriented.  Quadriplegic.  Data Reviewed: I have personally reviewed following labs and imaging studies  CBC: Recent Labs  Lab 11/20/22 0842 11/22/22 0811 11/24/22 1058  WBC 24.8* 16.6* 10.3  NEUTROABS 20.4* 13.6* 7.5  HGB 14.2 12.6 12.1  HCT 45.1 40.3 39.7  MCV 84.0 82.8 86.7  PLT PLATELET CLUMPS NOTED ON SMEAR, UNABLE TO ESTIMATE 367 510*    Basic Metabolic Panel: Recent Labs  Lab 11/20/22 0842 11/22/22 0811 11/24/22 1058 11/26/22 0731  NA 145 141 138 136  K 3.8 2.4* 4.1 4.4  CL 122* 116* 114* 114*  CO2 9* 15* 15* 15*  GLUCOSE 99 120* 116* 114*  BUN 19 14 10 11   CREATININE 0.88 0.75 0.65 0.67  CALCIUM 8.8* 8.3* 8.3* 8.3*    GFR: Estimated Creatinine Clearance: 55.7 mL/min (by C-G  formula based on SCr of 0.67 mg/dL). Liver Function Tests: No results for input(s): "AST", "ALT", "ALKPHOS", "BILITOT", "PROT", "ALBUMIN" in the last 168 hours.  No results for input(s): "LIPASE", "AMYLASE" in the last 168 hours. No results for input(s): "AMMONIA" in the last 168 hours. Coagulation Profile: No results for input(s): "INR", "PROTIME" in the last 168 hours. Cardiac Enzymes: No results for input(s): "CKTOTAL", "CKMB", "CKMBINDEX", "TROPONINI" in the last 168 hours. BNP (last 3 results) No results for input(s): "PROBNP" in the last 8760 hours. HbA1C: No results for input(s): "HGBA1C" in the  last 72 hours.  CBG: Recent Labs  Lab 11/25/22 0941 11/25/22 1117 11/25/22 1627 11/25/22 2130 11/26/22 0850  GLUCAP 108* 108* 162* 146* 135*    Lipid Profile: No results for input(s): "CHOL", "HDL", "LDLCALC", "TRIG", "CHOLHDL", "LDLDIRECT" in the last 72 hours. Thyroid Function Tests: No results for input(s): "TSH", "T4TOTAL", "FREET4", "T3FREE", "THYROIDAB" in the last 72 hours. Anemia Panel: No results for input(s): "VITAMINB12", "FOLATE", "FERRITIN", "TIBC", "IRON", "RETICCTPCT" in the last 72 hours. Sepsis Labs: Recent Labs  Lab 11/20/22 1404  LATICACIDVEN 1.4     Recent Results (from the past 240 hour(s))  MRSA Next Gen by PCR, Nasal     Status: Abnormal   Collection Time: 11/16/22  7:56 PM   Specimen: Nasal Mucosa; Nasal Swab  Result Value Ref Range Status   MRSA by PCR Next Gen DETECTED (A) NOT DETECTED Final    Comment: RESULT CALLED TO, READ BACK BY AND VERIFIED WITH: RN TESSA PETERS ON 11/16/22 @ 2137 BY DRT (NOTE) The GeneXpert MRSA Assay (FDA approved for NASAL specimens only), is one component of a comprehensive MRSA colonization surveillance program. It is not intended to diagnose MRSA infection nor to guide or monitor treatment for MRSA infections. Test performance is not FDA approved in patients less than 59 years old. Performed at [facility], [address] [place], [place] [address]   Urine Culture     Status: None   Collection Time: 11/16/22 11:26 PM   Specimen: Urine, Random  Result Value Ref Range Status   Specimen Description URINE, RANDOM  Final   Special Requests NONE Reflexed from [id number]  Final   Culture   Final    NO GROWTH Performed at [facility], [address] [place], [place] [address]    Report Status 11/18/2022 FINAL  Final  Culture, blood (Routine X 2) w Reflex to ID Panel     Status: None   Collection Time: 11/20/22  2:06 PM   Specimen: BLOOD RIGHT HAND  Result Value Ref Range Status   Specimen Description BLOOD RIGHT HAND  Final   Special Requests   Final    BOTTLES DRAWN AEROBIC ONLY Blood Culture results may not be optimal due to an inadequate volume of blood received in culture bottles   Culture   Final    NO GROWTH 5 DAYS Performed at [facility], [address], [place], [place] [address]    Report Status 11/25/2022 FINAL  Final  Culture, blood (Routine X 2) w Reflex to ID Panel     Status: None   Collection Time: 11/20/22  2:07 PM   Specimen: BLOOD RIGHT HAND  Result Value Ref Range Status   Specimen Description BLOOD RIGHT HAND  Final   Special Requests   Final    BOTTLES DRAWN AEROBIC ONLY Blood Culture results may not be optimal due to an inadequate volume of blood received in culture bottles   Culture   Final    NO GROWTH 5 DAYS Performed at [facility], [address] [place], [place] [address]    Report Status 11/25/2022 FINAL  Final  Remove and replace urinary cath (placed > 5 days) then obtain urine culture from new indwelling urinary catheter.     Status: Abnormal (Preliminary result)   Collection Time: 11/22/22  9:23 AM   Specimen: Urine, Catheterized  Result Value Ref Range Status   Specimen Description URINE, CATHETERIZED  Final   Special Requests NONE  Final   Culture (A)  Final    5,000 COLONIES/mL ESCHERICHIA COLI CONFIRMATION OF  SUSCEPTIBILITIES IN PROGRESS Performed at [facility], [address] [place], [place] [address]    Report Status PENDING  Incomplete     Radiology Studies: No results found.  Scheduled Meds:  apixaban  5 mg Oral BID   carvedilol  3.125 mg Oral BID WC   insulin aspart  0-5 Units Subcutaneous QHS   insulin aspart  0-9 Units Subcutaneous TID WC   lacosamide  50 mg Oral BID   levETIRAcetam  1,500 mg Oral BID   sacubitril-valsartan  1 tablet Oral BID   Continuous Infusions:     LOS: 10 days   Hughie Closs, MD [facility]  11/26/2022, 9:33 AM   *Please note that this is a verbal dictation therefore any spelling or grammatical errors are due to the "Dragon Medical One" system interpretation.  Please page via Amion and do not message via secure chat for urgent patient care matters. Secure chat can be used for non urgent patient care matters.  How to contact the [facility] Attending or Consulting provider 7A - 7P or covering provider during after hours 7P -7A, for this patient?  Check the care team in [facility] and look for a) attending/consulting [facility] provider listed and b) the [facility] team listed. Page or secure chat 7A-7P. Log into [email or web address] and use [facility]'s universal password to access. If you do not have the password, please contact the hospital operator. Locate the [facility] provider you are looking for under [facility] and page to a number that you can be directly reached. If you still have difficulty reaching the provider, please page the [facility] (Director on Call) for the Hospitalists listed on amion for assistance.

## 2022-11-26 NOTE — Progress Notes (Signed)
[employer] remains pending for [facility] at this time. Anticipate determination by Monday. SW will provide updates as available.   Dellie Burns, MSW, LCSW [phone] (coverage)

## 2022-11-27 DIAGNOSIS — R569 Unspecified convulsions: Secondary | ICD-10-CM | POA: Diagnosis not present

## 2022-11-27 LAB — GLUCOSE, CAPILLARY
Glucose-Capillary: 92 mg/dL (ref 70–99)
Glucose-Capillary: 99 mg/dL (ref 70–99)
Glucose-Capillary: 116 mg/dL — ABNORMAL HIGH (ref 70–99)
Glucose-Capillary: 125 mg/dL — ABNORMAL HIGH (ref 70–99)

## 2022-11-27 NOTE — TOC Progression Note (Signed)
Transition of Care [facility]) - Progression Note    Patient Details  Name: Jocelyn Sanchez MRN: [id number] Date of Birth: 27-Jul-1955  Transition of [facility] CM/SW Contact  Kahlen Morais A [place], [place] Phone Number: 11/27/2022, 11:16 AM  Clinical Narrative:     CSW uploaded additional clinical information for insurance authorization as requested by insurance carrier. Status pending.   TOC will continue to follow.   Expected Discharge Plan: Skilled Nursing Facility Barriers to Discharge: English as a second language teacher, SNF Pending bed offer  Expected Discharge Plan and Services   Discharge Planning Services: CM Consult Post Acute Care Choice: Resumption of Svcs/PTA Provider Living arrangements for the past 2 months: Single Family Home                             HH Agency: [facility]     Representative spoke with at [facility] Agency: Patient is currently active with [facility] health for RN for wound care for chronic sacral wound   Social Determinants of Health (SDOH) Interventions SDOH Screenings   Housing: Patient Unable To Answer (11/09/2022)  Tobacco Use: Medium Risk (11/13/2022)    Readmission Risk Interventions    11/20/2022   12:01 PM 11/14/2022    2:15 PM  Readmission Risk Prevention Plan  Transportation Screening Complete Complete  PCP or Specialist Appt within 5-7 Days Complete Complete  Home Care Screening Complete Complete  Medication Review (RN CM) Complete Complete

## 2022-11-27 NOTE — Progress Notes (Signed)
PROGRESS NOTE    Jocelyn Sanchez  ZOX:[id number] DOB: Jul 24, 1955 DOA: 11/16/2022 PCP: Bethanie Dicker, FNP   Brief Narrative:  Jocelyn Sanchez is a 67 y.o. female with PMH significant for multiple sclerosis with quadriplegia/bedbound status, chronic sacral ulcer, prior PE on Eliquis, anemia, DM2, hypertension, lymphedema.   5/30, patient was brought to the ED by EMS with seizures. No history of seizure,.  Family noted patient was unwell, not responding and foaming from her mouth.. EMS noted rhythmic shaking of all extremities as well as horizontal rhythmic moving of eyes.  She was given IM Versed.  Remained tachycardic and had notable pinpoint pupil.  Narcan was given without improvement.  Patient was given BMV ventilation and brought to the ED.   DNR   CT head with asymmetric edema in the left cerebral hemisphere possibly secondary to postictal state, but early large left MCA territory infarct is also possible.  Started on Keppra.  Neurology consulted Admitted to ICU for status epilepticus 5/31, patient had prolonged seizure and EEG.   5/31, PCCM Dr. Tonia Brooms had an extensive bedside discussion with patient's family regarding her current circumstances, poor prognosis and poor quality of life.  Family made a choice to transition her to full comfort care Transferred [facility]  Assessment & Plan:   Principal Problem:   Seizure [facility]) Active Problems:   Acute metabolic encephalopathy  New onset seizure / Status epilepticus Primarily admitted for seizure.   AEDs to continue to prevent further seizures Currently on oral Keppra, Vimpat and as needed Versed IV.  No reports of problem with swallowing.   Acute metabolic encephalopathy Family states he had baseline normal cognitive function.   Patient had prolonged seizure leading to prolonged postictal state and altered mental status. Family has noticed gradual improvement and is hopeful for meaningful recovery.  Per notes from previous  hospitalist, she was only able to open eyes on commands but mumbles.  Since I assumed her care on 11/22/2022, gradually patient has improved significantly and is back to baseline.  Her sister was at the bedside verified today.   Multiple sclerosis with quadriplegia/bedbound status Chronic sacral ulcer with hx of quadriplegia (POA) Continue supportive care Local would care  Pressure alleviating devices    SIRS secondary to presumed UTI, patient with history of ESBL UTI 11/08/2022, treated with Zosyn. 6/3, patient was tachycardic, WBC count was elevated.  Urinalysis showed turbid yellow urine with positive nitrate, large hemoglobin, moderate leukocytes and many bacteria.  She was started on Zosyn for the treatment of UTI.  Urine culture was not sent.  I ordered urine culture but it was never sent again.  Patient completed 5 days of Zosyn.  Hypokalemia Resolved.  History of PE: Continue Eliquis.  H/o combined systolic and diastolic CHF/ Essential hypertension Stable.  Continue home medications which is Coreg and Entresto.     Type 2 diabetes Last hemoglobin A1c documented in the chart was 3 years ago which is 6.7.  Hemoglobin A1c at this time was 5.2.  Blood sugar very well-controlled.  She has not required any insulin per SSI.  Will discontinue that.  Goal of care: Family meeting took place with palliative care on 11/22/2022, they decided in favor of no artificial feeding or hydration now or in the future, comfort feeds as tolerated.  Family is hopeful for SNF for long-term care and palliative services.  They are not interested in hospice at this time.  Appreciate palliative care help.  DVT prophylaxis: Eliquis   Code  Status: DNR  Family Communication: Sister present at bedside.  Discussed in length with her.  Palliative care in constant communication with family.  Status is: Inpatient Remains inpatient appropriate because: Medically stable now.  Pending placement to SNF for long-term care.   TOC working on it.  Waiting for insurance authorization.   Estimated body mass index is 22.3 kg/m as calculated from the following:   Height as of 11/08/22: 5\' 3"  (1.6 m).   Weight as of this encounter: 57.1 kg.  Pressure Injury 11/09/22 Heel Left Deep Tissue Pressure Injury - Purple or maroon localized area of discolored intact skin or blood-filled blister due to damage of underlying soft tissue from pressure and/or shear. 2 cm x 3 cm (Active)  11/09/22 0700  Location: Heel  Location Orientation: Left  Staging: Deep Tissue Pressure Injury - Purple or maroon localized area of discolored intact skin or blood-filled blister due to damage of underlying soft tissue from pressure and/or shear.  Wound Description (Comments): 2 cm x 3 cm  Present on Admission:   Dressing Type Foam - Lift dressing to assess site every shift 11/25/22 2030     Pressure Injury 11/09/22 Buttocks Right Stage 3 -  Full thickness tissue loss. Subcutaneous fat may be visible but bone, tendon or muscle are NOT exposed. 2 cm x 3 cm x 1 cm w/1.4 cm undermining from 12-3 o'clock (Active)  11/09/22 0700  Location: Buttocks  Location Orientation: Right  Staging: Stage 3 -  Full thickness tissue loss. Subcutaneous fat may be visible but bone, tendon or muscle are NOT exposed.  Wound Description (Comments): 2 cm x 3 cm x 1 cm w/1.4 cm undermining from 12-3 o'clock  Present on Admission: Yes  Dressing Type Foam - Lift dressing to assess site every shift;Gauze (Comment) (gauze packed) 11/27/22 0650     Pressure Injury 11/16/22 Elbow Anterior;Left Wound from arm contracture to elbow crevice, stage 2/ skin tear (Active)  11/16/22 2000  Location: Elbow  Location Orientation: Anterior;Left  Staging:   Wound Description (Comments): Wound from arm contracture to elbow crevice, stage 2/ skin tear  Present on Admission: Yes  Dressing Type None 11/25/22 2030   Nutritional Assessment: Body mass index is 22.3 kg/m.Marland Kitchen Seen by  dietician.  I agree with the assessment and plan as outlined below: Nutrition Status:        . Skin Assessment: I have examined the patient's skin and I agree with the wound assessment as performed by the wound care RN as outlined below: Pressure Injury 11/09/22 Heel Left Deep Tissue Pressure Injury - Purple or maroon localized area of discolored intact skin or blood-filled blister due to damage of underlying soft tissue from pressure and/or shear. 2 cm x 3 cm (Active)  11/09/22 0700  Location: Heel  Location Orientation: Left  Staging: Deep Tissue Pressure Injury - Purple or maroon localized area of discolored intact skin or blood-filled blister due to damage of underlying soft tissue from pressure and/or shear.  Wound Description (Comments): 2 cm x 3 cm  Present on Admission:   Dressing Type Foam - Lift dressing to assess site every shift 11/25/22 2030     Pressure Injury 11/09/22 Buttocks Right Stage 3 -  Full thickness tissue loss. Subcutaneous fat may be visible but bone, tendon or muscle are NOT exposed. 2 cm x 3 cm x 1 cm w/1.4 cm undermining from 12-3 o'clock (Active)  11/09/22 0700  Location: Buttocks  Location Orientation: Right  Staging: Stage 3 -  Full thickness tissue loss. Subcutaneous fat may be visible but bone, tendon or muscle are NOT exposed.  Wound Description (Comments): 2 cm x 3 cm x 1 cm w/1.4 cm undermining from 12-3 o'clock  Present on Admission: Yes  Dressing Type Foam - Lift dressing to assess site every shift;Gauze (Comment) (gauze packed) 11/27/22 0650     Pressure Injury 11/16/22 Elbow Anterior;Left Wound from arm contracture to elbow crevice, stage 2/ skin tear (Active)  11/16/22 2000  Location: Elbow  Location Orientation: Anterior;Left  Staging:   Wound Description (Comments): Wound from arm contracture to elbow crevice, stage 2/ skin tear  Present on Admission: Yes  Dressing Type None 11/25/22 2030    Consultants:  Palliative care  Procedures:   None  Antimicrobials:  Anti-infectives (From admission, onward)    Start     Dose/Rate Route Frequency Ordered Stop   11/20/22 1400  piperacillin-tazobactam (ZOSYN) IVPB 3.375 g        3.375 g 12.5 mL/hr over 240 Minutes Intravenous Every 8 hours 11/20/22 1251 11/25/22 0904         Subjective: Patient seen and examined.  She has no complaints.  She keeps asking when she will be going home.  And I keep reminding her that we are planning to discharge her to SNF.  Overall continues to improve and much better and this is her baseline.  Objective: Vitals:   11/26/22 1645 11/26/22 2006 11/27/22 0425 11/27/22 0802  BP: (!) 133/93 (!) 147/92 130/72 (!) 141/88  Pulse: (!) 105  90 98  Resp: 18 18 16 18   Temp: 98.2 F (36.8 C) 98.3 F (36.8 C) 98.9 F (37.2 C) (!) 97.3 F (36.3 C)  TempSrc:      SpO2: 100% 100% 100% 100%  Weight:        Intake/Output Summary (Last 24 hours) at 11/27/2022 1050 Last data filed at 11/27/2022 0139 Gross per 24 hour  Intake 236 ml  Output 950 ml  Net -714 ml    Filed Weights   11/16/22 1911 11/17/22 0420  Weight: 57.1 kg 57.1 kg    Examination:  General exam: Appears calm and comfortable  Respiratory system: Clear to auscultation. Respiratory effort normal. Cardiovascular system: S1 & S2 heard, RRR. No JVD, murmurs, rubs, gallops or clicks. No pedal edema. Gastrointestinal system: Abdomen is nondistended, soft and nontender. No organomegaly or masses felt. Normal bowel sounds heard. Central nervous system: Alert and oriented.  Quadriplegic   Data Reviewed: I have personally reviewed following labs and imaging studies  CBC: Recent Labs  Lab 11/22/22 0811 11/24/22 1058  WBC 16.6* 10.3  NEUTROABS 13.6* 7.5  HGB 12.6 12.1  HCT 40.3 39.7  MCV 82.8 86.7  PLT 367 510*    Basic Metabolic Panel: Recent Labs  Lab 11/22/22 0811 11/24/22 1058 11/26/22 0731  NA 141 138 136  K 2.4* 4.1 4.4  CL 116* 114* 114*  CO2 15* 15* 15*  GLUCOSE  120* 116* 114*  BUN 14 10 11   CREATININE 0.75 0.65 0.67  CALCIUM 8.3* 8.3* 8.3*    GFR: Estimated Creatinine Clearance: 55.7 mL/min (by C-G formula based on SCr of 0.67 mg/dL). Liver Function Tests: No results for input(s): "AST", "ALT", "ALKPHOS", "BILITOT", "PROT", "ALBUMIN" in the last 168 hours.  No results for input(s): "LIPASE", "AMYLASE" in the last 168 hours. No results for input(s): "AMMONIA" in the last 168 hours. Coagulation Profile: No results for input(s): "INR", "PROTIME" in the last 168 hours. Cardiac Enzymes: No results  for input(s): "CKTOTAL", "CKMB", "CKMBINDEX", "TROPONINI" in the last 168 hours. BNP (last 3 results) No results for input(s): "PROBNP" in the last 8760 hours. HbA1C: No results for input(s): "HGBA1C" in the last 72 hours.  CBG: Recent Labs  Lab 11/26/22 0850 11/26/22 1317 11/26/22 1535 11/26/22 2033 11/27/22 0822  GLUCAP 135* 133* 148* 136* 92    Lipid Profile: No results for input(s): "CHOL", "HDL", "LDLCALC", "TRIG", "CHOLHDL", "LDLDIRECT" in the last 72 hours. Thyroid Function Tests: No results for input(s): "TSH", "T4TOTAL", "FREET4", "T3FREE", "THYROIDAB" in the last 72 hours. Anemia Panel: No results for input(s): "VITAMINB12", "FOLATE", "FERRITIN", "TIBC", "IRON", "RETICCTPCT" in the last 72 hours. Sepsis Labs: Recent Labs  Lab 11/20/22 1404  LATICACIDVEN 1.4     Recent Results (from the past 240 hour(s))  Culture, blood (Routine X 2) w Reflex to ID Panel     Status: None   Collection Time: 11/20/22  2:06 PM   Specimen: BLOOD RIGHT HAND  Result Value Ref Range Status   Specimen Description BLOOD RIGHT HAND  Final   Special Requests   Final    BOTTLES DRAWN AEROBIC ONLY Blood Culture results may not be optimal due to an inadequate volume of blood received in culture bottles   Culture   Final    NO GROWTH 5 DAYS Performed at [facility], [address], [place], [place] [address]    Report Status 11/25/2022 FINAL   Final  Culture, blood (Routine X 2) w Reflex to ID Panel     Status: None   Collection Time: 11/20/22  2:07 PM   Specimen: BLOOD RIGHT HAND  Result Value Ref Range Status   Specimen Description BLOOD RIGHT HAND  Final   Special Requests   Final    BOTTLES DRAWN AEROBIC ONLY Blood Culture results may not be optimal due to an inadequate volume of blood received in culture bottles   Culture   Final    NO GROWTH 5 DAYS Performed at [facility], [address], [place], [place] [address]    Report Status 11/25/2022 FINAL  Final  Remove and replace urinary cath (placed > 5 days) then obtain urine culture from new indwelling urinary catheter.     Status: Abnormal   Collection Time: 11/22/22  9:23 AM   Specimen: Urine, Catheterized  Result Value Ref Range Status   Specimen Description URINE, CATHETERIZED  Final   Special Requests   Final    NONE Performed at [facility], [address] [place], [place] [address]    Culture (A)  Final    5,000 COLONIES/mL ESCHERICHIA COLI Confirmed Extended Spectrum Beta-Lactamase Producer (ESBL).  In bloodstream infections from ESBL organisms, carbapenems are preferred over piperacillin/tazobactam. They are shown to have a lower risk of mortality.    Report Status 11/26/2022 FINAL  Final   Organism ID, Bacteria ESCHERICHIA COLI (A)  Final      Susceptibility   Escherichia coli - MIC*    AMPICILLIN >=32 RESISTANT Resistant     CEFAZOLIN >=64 RESISTANT Resistant     CEFEPIME >=32 RESISTANT Resistant     CEFTRIAXONE >=64 RESISTANT Resistant     CIPROFLOXACIN >=4 RESISTANT Resistant     GENTAMICIN <=1 SENSITIVE Sensitive     IMIPENEM 0.5 SENSITIVE Sensitive     NITROFURANTOIN <=16 SENSITIVE Sensitive     TRIMETH/SULFA <=20 SENSITIVE Sensitive     AMPICILLIN/SULBACTAM >=32 RESISTANT Resistant     PIP/TAZO >=128 RESISTANT Resistant     *  5,000 COLONIES/mL ESCHERICHIA COLI     Radiology Studies: No results found.  Scheduled Meds:   apixaban  5 mg Oral BID   carvedilol  3.125 mg Oral BID WC   insulin aspart  0-5 Units Subcutaneous QHS   insulin aspart  0-9 Units Subcutaneous TID WC   lacosamide  50 mg Oral BID   levETIRAcetam  1,500 mg Oral BID   sacubitril-valsartan  1 tablet Oral BID   Continuous Infusions:     LOS: 11 days   Jocelyn Closs, MD [facility]  11/27/2022, 10:50 AM   *Please note that this is a verbal dictation therefore any spelling or grammatical errors are due to the "[employer]agon [employer] One" system interpretation.  Please page via Amion and do not message via secure chat for urgent patient care matters. Secure chat can be used for non urgent patient care matters.  How to contact the [facility] Attending or Consulting provider 7A - 7P or covering provider during after hours 7P -7A, for this patient?  Check the care team in [facility] and look for a) attending/consulting [facility] provider listed and b) the [facility] team listed. Page or secure chat 7A-7P. Log into [email or web address] and use [place]'s universal password to access. If you do not have the password, please contact the hospital operator. Locate the [facility] provider you are looking for under [facility] and page to a number that you can be directly reached. If you still have difficulty reaching the provider, please page the [facility] (Director on Call) for the Hospitalists listed on amion for assistance.

## 2022-11-28 DIAGNOSIS — R569 Unspecified convulsions: Secondary | ICD-10-CM | POA: Diagnosis not present

## 2022-11-28 LAB — GLUCOSE, CAPILLARY
Glucose-Capillary: 85 mg/dL (ref 70–99)
Glucose-Capillary: 128 mg/dL — ABNORMAL HIGH (ref 70–99)
Glucose-Capillary: 103 mg/dL — ABNORMAL HIGH (ref 70–99)
Glucose-Capillary: 128 mg/dL — ABNORMAL HIGH (ref 70–99)

## 2022-11-28 NOTE — Progress Notes (Signed)
PROGRESS NOTE    [name]  ZOX:[id number] DOB: 1956/03/06 DOA: 11/16/2022 PCP: Bethanie Dicker, FNP   Brief Narrative:  Jocelyn Sanchez is a 67 y.o. female with PMH significant for multiple sclerosis with quadriplegia/bedbound status, chronic sacral ulcer, prior PE on Eliquis, anemia, DM2, hypertension, lymphedema.   5/30, patient was brought to the ED by EMS with seizures. No history of seizure,.  Family noted patient was unwell, not responding and foaming from her mouth.. EMS noted rhythmic shaking of all extremities as well as horizontal rhythmic moving of eyes.  She was given IM Versed.  Remained tachycardic and had notable pinpoint pupil.  Narcan was given without improvement.  Patient was given BMV ventilation and brought to the ED.   DNR   CT head with asymmetric edema in the left cerebral hemisphere possibly secondary to postictal state, but early large left MCA territory infarct is also possible.  Started on Keppra.  Neurology consulted Admitted to ICU for status epilepticus 5/31, patient had prolonged seizure and EEG.   5/31, PCCM Dr. Tonia Brooms had an extensive bedside discussion with patient's family regarding her current circumstances, poor prognosis and poor quality of life.  Family made a choice to transition her to full comfort care Transferred to [facility]  Assessment & Plan:   Principal Problem:   Seizure [facility]) Active Problems:   Acute metabolic encephalopathy  New onset seizure / Status epilepticus Primarily admitted for seizure.   AEDs to continue to prevent further seizures Currently on oral Keppra, Vimpat and as needed Versed IV.  No reports of problem with swallowing.   Acute metabolic encephalopathy Family states he had baseline normal cognitive function.   Patient had prolonged seizure leading to prolonged postictal state and altered mental status. Family has noticed gradual improvement and is hopeful for meaningful recovery.  Per notes from previous  hospitalist, she was only able to open eyes on commands but mumbles.  Since I assumed her care on 11/22/2022, gradually patient has improved significantly and is back to baseline for last few days.   Multiple sclerosis with quadriplegia/bedbound status Chronic sacral ulcer with hx of quadriplegia (POA) Continue supportive care Local would care  Pressure alleviating devices    SIRS secondary to presumed UTI, patient with history of ESBL UTI 11/08/2022, treated with Zosyn. 6/3, patient was tachycardic, WBC count was elevated.  Urinalysis showed turbid yellow urine with positive nitrate, large hemoglobin, moderate leukocytes and many bacteria.  She was started on Zosyn for the treatment of UTI.  Urine culture was not sent.  I ordered urine culture but it was never sent again.  Patient completed 5 days of Zosyn.  Hypokalemia Resolved.  History of PE: Continue Eliquis.  H/o combined systolic and diastolic CHF/ Essential hypertension Stable.  Continue home medications which is Coreg and Entresto.     Type 2 diabetes Last hemoglobin A1c documented in the chart was 3 years ago which is 6.7.  Hemoglobin A1c at this time was 5.2.  Blood sugar very well-controlled.  She has not required any insulin per SSI.  Will discontinue that.  Goal of care: Family meeting took place with palliative care on 11/22/2022, they decided in favor of no artificial feeding or hydration now or in the future, comfort feeds as tolerated.  Family is hopeful for SNF for long-term care and palliative services.  They are not interested in hospice at this time.  Appreciate palliative care help.  DVT prophylaxis: Eliquis   Code Status: DNR  Family Communication:  None present at bedside.  Discussed in length with her.  Palliative care in constant communication with family.  Status is: Inpatient Remains inpatient appropriate because: Medically stable now.  Pending placement to SNF for long-term care.  I was informed by [facility] that her  insurance company is requesting P2P.  I did P2P with her insurance company and waiting for decision.   Estimated body mass index is 22.3 kg/m as calculated from the following:   Height as of 11/08/22: 5\' 3"  (1.6 m).   Weight as of this encounter: 57.1 kg.  Pressure Injury 11/09/22 Heel Left Deep Tissue Pressure Injury - Purple or maroon localized area of discolored intact skin or blood-filled blister due to damage of underlying soft tissue from pressure and/or shear. 2 cm x 3 cm (Active)  11/09/22 0700  Location: Heel  Location Orientation: Left  Staging: Deep Tissue Pressure Injury - Purple or maroon localized area of discolored intact skin or blood-filled blister due to damage of underlying soft tissue from pressure and/or shear.  Wound Description (Comments): 2 cm x 3 cm  Present on Admission:   Dressing Type Foam - Lift dressing to assess site every shift 11/27/22 0900     Pressure Injury 11/09/22 Buttocks Right Stage 3 -  Full thickness tissue loss. Subcutaneous fat may be visible but bone, tendon or muscle are NOT exposed. 2 cm x 3 cm x 1 cm w/1.4 cm undermining from 12-3 o'clock (Active)  11/09/22 0700  Location: Buttocks  Location Orientation: Right  Staging: Stage 3 -  Full thickness tissue loss. Subcutaneous fat may be visible but bone, tendon or muscle are NOT exposed.  Wound Description (Comments): 2 cm x 3 cm x 1 cm w/1.4 cm undermining from 12-3 o'clock  Present on Admission: Yes  Dressing Type Foam - Lift dressing to assess site every shift 11/27/22 0900     Pressure Injury 11/16/22 Elbow Anterior;Left Wound from arm contracture to elbow crevice, stage 2/ skin tear (Active)  11/16/22 2000  Location: Elbow  Location Orientation: Anterior;Left  Staging:   Wound Description (Comments): Wound from arm contracture to elbow crevice, stage 2/ skin tear  Present on Admission: Yes  Dressing Type None 11/27/22 0900   Nutritional Assessment: Body mass index is 22.3  kg/m.Marland Kitchen Seen by dietician.  I agree with the assessment and plan as outlined below: Nutrition Status:        . Skin Assessment: I have examined the patient's skin and I agree with the wound assessment as performed by the wound care RN as outlined below: Pressure Injury 11/09/22 Heel Left Deep Tissue Pressure Injury - Purple or maroon localized area of discolored intact skin or blood-filled blister due to damage of underlying soft tissue from pressure and/or shear. 2 cm x 3 cm (Active)  11/09/22 0700  Location: Heel  Location Orientation: Left  Staging: Deep Tissue Pressure Injury - Purple or maroon localized area of discolored intact skin or blood-filled blister due to damage of underlying soft tissue from pressure and/or shear.  Wound Description (Comments): 2 cm x 3 cm  Present on Admission:   Dressing Type Foam - Lift dressing to assess site every shift 11/27/22 0900     Pressure Injury 11/09/22 Buttocks Right Stage 3 -  Full thickness tissue loss. Subcutaneous fat may be visible but bone, tendon or muscle are NOT exposed. 2 cm x 3 cm x 1 cm w/1.4 cm undermining from 12-3 o'clock (Active)  11/09/22 0700  Location: Buttocks  Location Orientation:  Right  Staging: Stage 3 -  Full thickness tissue loss. Subcutaneous fat may be visible but bone, tendon or muscle are NOT exposed.  Wound Description (Comments): 2 cm x 3 cm x 1 cm w/1.4 cm undermining from 12-3 o'clock  Present on Admission: Yes  Dressing Type Foam - Lift dressing to assess site every shift 11/27/22 0900     Pressure Injury 11/16/22 Elbow Anterior;Left Wound from arm contracture to elbow crevice, stage 2/ skin tear (Active)  11/16/22 2000  Location: Elbow  Location Orientation: Anterior;Left  Staging:   Wound Description (Comments): Wound from arm contracture to elbow crevice, stage 2/ skin tear  Present on Admission: Yes  Dressing Type None 11/27/22 0900    Consultants:  Palliative care  Procedures:   None  Antimicrobials:  Anti-infectives (From admission, onward)    Start     Dose/Rate Route Frequency Ordered Stop   11/20/22 1400  piperacillin-tazobactam (ZOSYN) IVPB 3.375 g        3.375 g 12.5 mL/hr over 240 Minutes Intravenous Every 8 hours 11/20/22 1251 11/25/22 0904         Subjective: Patient seen and examined.  No complaints.  Continues to ask when she will be discharged.  Objective: Vitals:   11/27/22 1453 11/27/22 2111 11/28/22 0449 11/28/22 0737  BP: 132/75 (!) 140/96 122/76 (!) 140/88  Pulse: (!) 101 96 87 97  Resp: 18 16 16    Temp: 97.7 F (36.5 C) 98.5 F (36.9 C) 98.2 F (36.8 C) 97.8 F (36.6 C)  TempSrc:      SpO2: 100% 100% 100% 100%  Weight:        Intake/Output Summary (Last 24 hours) at 11/28/2022 1120 Last data filed at 11/28/2022 0400 Gross per 24 hour  Intake --  Output 1500 ml  Net -1500 ml    Filed Weights   11/16/22 1911 11/17/22 0420  Weight: 57.1 kg 57.1 kg    Examination:  General exam: Appears calm and comfortable  Respiratory system: Clear to auscultation. Respiratory effort normal. Cardiovascular system: S1 & S2 heard, RRR. No JVD, murmurs, rubs, gallops or clicks. No pedal edema. Gastrointestinal system: Abdomen is nondistended, soft and nontender. No organomegaly or masses felt. Normal bowel sounds heard. Central nervous system: Alert and oriented.  Quadriplegic   Data Reviewed: I have personally reviewed following labs and imaging studies  CBC: Recent Labs  Lab 11/22/22 0811 11/24/22 1058  WBC 16.6* 10.3  NEUTROABS 13.6* 7.5  HGB 12.6 12.1  HCT 40.3 39.7  MCV 82.8 86.7  PLT 367 510*    Basic Metabolic Panel: Recent Labs  Lab 11/22/22 0811 11/24/22 1058 11/26/22 0731  NA 141 138 136  K 2.4* 4.1 4.4  CL 116* 114* 114*  CO2 15* 15* 15*  GLUCOSE 120* 116* 114*  BUN 14 10 11   CREATININE 0.75 0.65 0.67  CALCIUM 8.3* 8.3* 8.3*    GFR: Estimated Creatinine Clearance: 55.7 mL/min (by C-G formula based  on SCr of 0.67 mg/dL). Liver Function Tests: No results for input(s): "AST", "ALT", "ALKPHOS", "BILITOT", "PROT", "ALBUMIN" in the last 168 hours.  No results for input(s): "LIPASE", "AMYLASE" in the last 168 hours. No results for input(s): "AMMONIA" in the last 168 hours. Coagulation Profile: No results for input(s): "INR", "PROTIME" in the last 168 hours. Cardiac Enzymes: No results for input(s): "CKTOTAL", "CKMB", "CKMBINDEX", "TROPONINI" in the last 168 hours. BNP (last 3 results) No results for input(s): "PROBNP" in the last 8760 hours. HbA1C: No results for input(s): "  HGBA1C" in the last 72 hours.  CBG: Recent Labs  Lab 11/27/22 0822 11/27/22 1301 11/27/22 1454 11/27/22 2121 11/28/22 0740  GLUCAP 92 99 116* 125* 85    Lipid Profile: No results for input(s): "CHOL", "HDL", "LDLCALC", "TRIG", "CHOLHDL", "LDLDIRECT" in the last 72 hours. Thyroid Function Tests: No results for input(s): "TSH", "T4TOTAL", "FREET4", "T3FREE", "THYROIDAB" in the last 72 hours. Anemia Panel: No results for input(s): "VITAMINB12", "FOLATE", "FERRITIN", "TIBC", "IRON", "RETICCTPCT" in the last 72 hours. Sepsis Labs: No results for input(s): "PROCALCITON", "LATICACIDVEN" in the last 168 hours.   Recent Results (from the past 240 hour(s))  Culture, blood (Routine X 2) w Reflex to ID Panel     Status: None   Collection Time: 11/20/22  2:06 PM   Specimen: BLOOD RIGHT HAND  Result Value Ref Range Status   Specimen Description BLOOD RIGHT HAND  Final   Special Requests   Final    BOTTLES DRAWN AEROBIC ONLY Blood Culture results may not be optimal due to an inadequate volume of blood received in culture bottles   Culture   Final    NO GROWTH 5 DAYS Performed at [facility], [address] [place], [place] [address]    Report Status 11/25/2022 FINAL  Final  Culture, blood (Routine X 2) w Reflex to ID Panel     Status: None   Collection Time: 11/20/22  2:07 PM   Specimen: BLOOD RIGHT  HAND  Result Value Ref Range Status   Specimen Description BLOOD RIGHT HAND  Final   Special Requests   Final    BOTTLES DRAWN AEROBIC ONLY Blood Culture results may not be optimal due to an inadequate volume of blood received in culture bottles   Culture   Final    NO GROWTH 5 DAYS Performed at [facility], [address] [place], [place] [address]    Report Status 11/25/2022 FINAL  Final  Remove and replace urinary cath (placed > 5 days) then obtain urine culture from new indwelling urinary catheter.     Status: Abnormal   Collection Time: 11/22/22  9:23 AM   Specimen: Urine, Catheterized  Result Value Ref Range Status   Specimen Description URINE, CATHETERIZED  Final   Special Requests   Final    NONE Performed at [facility], [address], [place], [place] [address]    Culture (A)  Final    5,000 COLONIES/mL ESCHERICHIA COLI Confirmed Extended Spectrum Beta-Lactamase Producer (ESBL).  In bloodstream infections from ESBL organisms, carbapenems are preferred over piperacillin/tazobactam. They are shown to have a lower risk of mortality.    Report Status 11/26/2022 FINAL  Final   Organism ID, Bacteria ESCHERICHIA COLI (A)  Final      Susceptibility   Escherichia coli - MIC*    AMPICILLIN >=32 RESISTANT Resistant     CEFAZOLIN >=64 RESISTANT Resistant     CEFEPIME >=32 RESISTANT Resistant     CEFTRIAXONE >=64 RESISTANT Resistant     CIPROFLOXACIN >=4 RESISTANT Resistant     GENTAMICIN <=1 SENSITIVE Sensitive     IMIPENEM 0.5 SENSITIVE Sensitive     NITROFURANTOIN <=16 SENSITIVE Sensitive     TRIMETH/SULFA <=20 SENSITIVE Sensitive     AMPICILLIN/SULBACTAM >=32 RESISTANT Resistant     PIP/TAZO >=128 RESISTANT Resistant     * 5,000 COLONIES/mL ESCHERICHIA COLI     Radiology Studies: No results found.  Scheduled Meds:  apixaban  5 mg Oral BID   carvedilol  3.125 mg Oral BID WC   insulin aspart  0-5 Units Subcutaneous QHS   insulin aspart  0-9 Units  Subcutaneous TID WC   lacosamide  50 mg Oral BID   levETIRAcetam  1,500 mg Oral BID   sacubitril-valsartan  1 tablet Oral BID   Continuous Infusions:     LOS: 12 days   Hughie Closs, MD [facility]  11/28/2022, 11:20 AM   *Please note that this is a verbal dictation therefore any spelling or grammatical errors are due to the "Dragon Medical One" system interpretation.  Please page via Amion and do not message via secure chat for urgent patient care matters. Secure chat can be used for non urgent patient care matters.  How to contact the [facility] Attending or Consulting provider 7A - 7P or covering provider during after hours 7P -7A, for this patient?  Check the care team in [facility] and look for a) attending/consulting TRH provider listed and b) the [facility] team listed. Page or secure chat 7A-7P. Log into [email or web address] and use [place]'s universal password to access. If you do not have the password, please contact the hospital operator. Locate the [facility] provider you are looking for under [facility] and page to a number that you can be directly reached. If you still have difficulty reaching the provider, please page the [facility] (Director on Call) for the Hospitalists listed on amion for assistance.

## 2022-11-28 NOTE — TOC Progression Note (Signed)
Transition of Care [facility]) - Progression Note    Patient Details  Name: Jocelyn Sanchez MRN: [id number] Date of Birth: 1955/07/07  [facility] CM/SW Contact  Kidus Delman A [place], [place] Phone Number: 11/28/2022, 4:36 PM  Clinical Narrative:     Provider did peer to peer in attempt to assist pt with authorization to SNF at [facility]. He stated that pt's authorization was denied. CSW given appeal information: . Appeals no. is [phone] Fax no. [phone].   CSW contacted pt's family members, Stanton Kidney and Rolm Gala to inform them of pt's denial.   There was no answer at either number. CSW was unable to leave voicemail with either contact.   CSW will follow up again at another time to inform family regarding other options for long term facility placement at SNF.     Expected Discharge Plan: Skilled Nursing Facility Barriers to Discharge: English as a second language teacher, SNF Pending bed offer  Expected Discharge Plan and Services   Discharge Planning Services: CM Consult Post Acute Care Choice: Resumption of Svcs/PTA Provider Living arrangements for the past 2 months: Single Family Home                             HH Agency: [facility]     Representative spoke with at [facility] Ms Agency: Patient is currently active with [facility] health for RN for wound care for chronic sacral wound   Social Determinants of Health (SDOH) Interventions SDOH Screenings   Housing: Patient Unable To Answer (11/09/2022)  Tobacco Use: Medium Risk (11/13/2022)    Readmission Risk Interventions    11/20/2022   12:01 PM 11/14/2022    2:15 PM  Readmission Risk Prevention Plan  Transportation Screening Complete Complete  PCP or Specialist Appt within 5-7 Days Complete Complete  Home Care Screening Complete Complete  Medication Review (RN CM) Complete Complete

## 2022-11-29 ENCOUNTER — Ambulatory Visit: Payer: Medicare PPO | Admitting: Podiatry

## 2022-11-29 DIAGNOSIS — R569 Unspecified convulsions: Secondary | ICD-10-CM | POA: Diagnosis not present

## 2022-11-29 LAB — GLUCOSE, CAPILLARY
Glucose-Capillary: 115 mg/dL — ABNORMAL HIGH (ref 70–99)
Glucose-Capillary: 127 mg/dL — ABNORMAL HIGH (ref 70–99)
Glucose-Capillary: 154 mg/dL — ABNORMAL HIGH (ref 70–99)
Glucose-Capillary: 134 mg/dL — ABNORMAL HIGH (ref 70–99)

## 2022-11-29 NOTE — Progress Notes (Signed)
PROGRESS NOTE    Jocelyn Sanchez  ZOX:[id number] DOB: [birth year] DOA: 11/16/2022 PCP: Bethanie Dicker, FNP   Brief Narrative:  Jocelyn Sanchez is a 67 y.o. female with PMH significant for multiple sclerosis with quadriplegia/bedbound status, chronic sacral ulcer, prior PE on Eliquis, anemia, DM2, hypertension, lymphedema.   5/30, patient was brought to the ED by EMS with seizures. No history of seizure,.  Family noted patient was unwell, not responding and foaming from her mouth.. EMS noted rhythmic shaking of all extremities as well as horizontal rhythmic moving of eyes.  She was given IM Versed.  Remained tachycardic and had notable pinpoint pupil.  Narcan was given without improvement.  Patient was given BMV ventilation and brought to the ED.   DNR   CT head with asymmetric edema in the left cerebral hemisphere possibly secondary to postictal state, but early large left MCA territory infarct is also possible.  Started on Keppra.  Neurology consulted Admitted to ICU for status epilepticus 5/31, patient had prolonged seizure and EEG.   5/31, PCCM Dr. Tonia Brooms had an extensive bedside discussion with patient's family regarding her current circumstances, poor prognosis and poor quality of life.  Family made a choice to transition her to full comfort care Transferred to [facility]  Assessment & Plan:   Principal Problem:   Seizure [facility]) Active Problems:   Acute metabolic encephalopathy  New onset seizure / Status epilepticus Primarily admitted for seizure.   AEDs to continue to prevent further seizures Currently on oral Keppra, Vimpat and as needed Versed IV.  No reports of problem with swallowing.   Acute metabolic encephalopathy Family states he had baseline normal cognitive function.   Patient had prolonged seizure leading to prolonged postictal state and altered mental status. Family has noticed gradual improvement and is hopeful for meaningful recovery.  Per notes from previous  hospitalist, she was only able to open eyes on commands but mumbles.  Since I assumed her care on 11/22/2022, gradually patient has improved significantly and is back to baseline for last few days.   Multiple sclerosis with quadriplegia/bedbound status Chronic sacral ulcer with hx of quadriplegia (POA) Continue supportive care Local would care  Pressure alleviating devices    SIRS secondary to presumed UTI, patient with history of ESBL UTI 11/08/2022, treated with Zosyn. 6/3, patient was tachycardic, WBC count was elevated.  Urinalysis showed turbid yellow urine with positive nitrate, large hemoglobin, moderate leukocytes and many bacteria.  She was started on Zosyn for the treatment of UTI.  Urine culture was not sent.  I ordered urine culture but it was never sent again.  Patient completed 5 days of Zosyn.  Hypokalemia Resolved.  History of PE: Continue Eliquis.  H/o combined systolic and diastolic CHF/ Essential hypertension Stable.  Continue home medications which is Coreg and Entresto.     Type 2 diabetes Last hemoglobin A1c documented in the chart was 3 years ago which is 6.7.  Hemoglobin A1c at this time was 5.2.  Blood sugar very well-controlled.  She has not required any insulin per SSI.  Will discontinue that.  Goal of care: Family meeting took place with palliative care on 11/22/2022, they decided in favor of no artificial feeding or hydration now or in the future, comfort feeds as tolerated.  Family is hopeful for SNF for long-term care and palliative services.  They are not interested in hospice at this time.  Appreciate palliative care help.  DVT prophylaxis: Eliquis   Code Status: DNR  Family Communication:  None present at bedside.  Discussed in length with her.  Palliative care in constant communication with family.  Status is: Inpatient Remains inpatient appropriate because: Medically stable now.  Did P2P on 11/28/2022 but patient was declined.  TOC trying to get a hold of  family to see what they want as neck step   Estimated body mass index is 22.3 kg/m as calculated from the following:   Height as of 11/08/22: 5\' 3"  (1.6 m).   Weight as of this encounter: 57.1 kg.  Pressure Injury 11/09/22 Heel Left Deep Tissue Pressure Injury - Purple or maroon localized area of discolored intact skin or blood-filled blister due to damage of underlying soft tissue from pressure and/or shear. 2 cm x 3 cm (Active)  11/09/22 0700  Location: Heel  Location Orientation: Left  Staging: Deep Tissue Pressure Injury - Purple or maroon localized area of discolored intact skin or blood-filled blister due to damage of underlying soft tissue from pressure and/or shear.  Wound Description (Comments): 2 cm x 3 cm  Present on Admission:   Dressing Type Foam - Lift dressing to assess site every shift 11/27/22 0900     Pressure Injury 11/09/22 Buttocks Right Stage 3 -  Full thickness tissue loss. Subcutaneous fat may be visible but bone, tendon or muscle are NOT exposed. 2 cm x 3 cm x 1 cm w/1.4 cm undermining from 12-3 o'clock (Active)  11/09/22 0700  Location: Buttocks  Location Orientation: Right  Staging: Stage 3 -  Full thickness tissue loss. Subcutaneous fat may be visible but bone, tendon or muscle are NOT exposed.  Wound Description (Comments): 2 cm x 3 cm x 1 cm w/1.4 cm undermining from 12-3 o'clock  Present on Admission: Yes  Dressing Type ABD;Gauze (Comment) 11/29/22 0620     Pressure Injury 11/16/22 Elbow Anterior;Left Wound from arm contracture to elbow crevice, stage 2/ skin tear (Active)  11/16/22 2000  Location: Elbow  Location Orientation: Anterior;Left  Staging:   Wound Description (Comments): Wound from arm contracture to elbow crevice, stage 2/ skin tear  Present on Admission: Yes  Dressing Type None 11/27/22 0900   Nutritional Assessment: Body mass index is 22.3 kg/m.Marland Kitchen Seen by dietician.  I agree with the assessment and plan as outlined below: Nutrition  Status:        . Skin Assessment: I have examined the patient's skin and I agree with the wound assessment as performed by the wound care RN as outlined below: Pressure Injury 11/09/22 Heel Left Deep Tissue Pressure Injury - Purple or maroon localized area of discolored intact skin or blood-filled blister due to damage of underlying soft tissue from pressure and/or shear. 2 cm x 3 cm (Active)  11/09/22 0700  Location: Heel  Location Orientation: Left  Staging: Deep Tissue Pressure Injury - Purple or maroon localized area of discolored intact skin or blood-filled blister due to damage of underlying soft tissue from pressure and/or shear.  Wound Description (Comments): 2 cm x 3 cm  Present on Admission:   Dressing Type Foam - Lift dressing to assess site every shift 11/27/22 0900     Pressure Injury 11/09/22 Buttocks Right Stage 3 -  Full thickness tissue loss. Subcutaneous fat may be visible but bone, tendon or muscle are NOT exposed. 2 cm x 3 cm x 1 cm w/1.4 cm undermining from 12-3 o'clock (Active)  11/09/22 0700  Location: Buttocks  Location Orientation: Right  Staging: Stage 3 -  Full thickness tissue loss. Subcutaneous fat may  be visible but bone, tendon or muscle are NOT exposed.  Wound Description (Comments): 2 cm x 3 cm x 1 cm w/1.4 cm undermining from 12-3 o'clock  Present on Admission: Yes  Dressing Type ABD;Gauze (Comment) 11/29/22 0620     Pressure Injury 11/16/22 Elbow Anterior;Left Wound from arm contracture to elbow crevice, stage 2/ skin tear (Active)  11/16/22 2000  Location: Elbow  Location Orientation: Anterior;Left  Staging:   Wound Description (Comments): Wound from arm contracture to elbow crevice, stage 2/ skin tear  Present on Admission: Yes  Dressing Type None 11/27/22 0900    Consultants:  Palliative care  Procedures:  None  Antimicrobials:  Anti-infectives (From admission, onward)    Start     Dose/Rate Route Frequency Ordered Stop   11/20/22  1400  piperacillin-tazobactam (ZOSYN) IVPB 3.375 g        3.375 g 12.5 mL/hr over 240 Minutes Intravenous Every 8 hours 11/20/22 1251 11/25/22 0904         Subjective: Patient seen and examined.  No complaints. Objective: Vitals:   11/28/22 1604 11/28/22 2105 11/29/22 0515 11/29/22 0806  BP: 136/77 (!) 146/79 (!) 141/77 129/77  Pulse: 94 100  99  Resp:  14 16 17   Temp:  98.3 F (36.8 C) 98.6 F (37 C) 97.8 F (36.6 C)  TempSrc:      SpO2: 100% 100% 100% 100%  Weight:        Intake/Output Summary (Last 24 hours) at 11/29/2022 1135 Last data filed at 11/29/2022 1610 Gross per 24 hour  Intake --  Output 1575 ml  Net -1575 ml    Filed Weights   11/16/22 1911 11/17/22 0420  Weight: 57.1 kg 57.1 kg    Examination:  General exam: Appears calm and comfortable  Respiratory system: Clear to auscultation. Respiratory effort normal. Cardiovascular system: S1 & S2 heard, RRR. No JVD, murmurs, rubs, gallops or clicks. No pedal edema. Gastrointestinal system: Abdomen is nondistended, soft and nontender. No organomegaly or masses felt. Normal bowel sounds heard. Central nervous system: Alert and oriented.  Quadriplegic   Data Reviewed: I have personally reviewed following labs and imaging studies  CBC: Recent Labs  Lab 11/24/22 1058  WBC 10.3  NEUTROABS 7.5  HGB 12.1  HCT 39.7  MCV 86.7  PLT 510*    Basic Metabolic Panel: Recent Labs  Lab 11/24/22 1058 11/26/22 0731  NA 138 136  K 4.1 4.4  CL 114* 114*  CO2 15* 15*  GLUCOSE 116* 114*  BUN 10 11  CREATININE 0.65 0.67  CALCIUM 8.3* 8.3*    GFR: Estimated Creatinine Clearance: 55.7 mL/min (by C-G formula based on SCr of 0.67 mg/dL). Liver Function Tests: No results for input(s): "AST", "ALT", "ALKPHOS", "BILITOT", "PROT", "ALBUMIN" in the last 168 hours.  No results for input(s): "LIPASE", "AMYLASE" in the last 168 hours. No results for input(s): "AMMONIA" in the last 168 hours. Coagulation Profile: No  results for input(s): "INR", "PROTIME" in the last 168 hours. Cardiac Enzymes: No results for input(s): "CKTOTAL", "CKMB", "CKMBINDEX", "TROPONINI" in the last 168 hours. BNP (last 3 results) No results for input(s): "PROBNP" in the last 8760 hours. HbA1C: No results for input(s): "HGBA1C" in the last 72 hours.  CBG: Recent Labs  Lab 11/28/22 0740 11/28/22 1219 11/28/22 1704 11/28/22 2102 11/29/22 0809  GLUCAP 85 128* 103* 128* 115*    Lipid Profile: No results for input(s): "CHOL", "HDL", "LDLCALC", "TRIG", "CHOLHDL", "LDLDIRECT" in the last 72 hours. Thyroid Function Tests: No  results for input(s): "TSH", "T4TOTAL", "FREET4", "T3FREE", "THYROIDAB" in the last 72 hours. Anemia Panel: No results for input(s): "VITAMINB12", "FOLATE", "FERRITIN", "TIBC", "IRON", "RETICCTPCT" in the last 72 hours. Sepsis Labs: No results for input(s): "PROCALCITON", "LATICACIDVEN" in the last 168 hours.   Recent Results (from the past 240 hour(s))  Culture, blood (Routine X 2) w Reflex to ID Panel     Status: None   Collection Time: 11/20/22  2:06 PM   Specimen: BLOOD RIGHT HAND  Result Value Ref Range Status   Specimen Description BLOOD RIGHT HAND  Final   Special Requests   Final    BOTTLES DRAWN AEROBIC ONLY Blood Culture results may not be optimal due to an inadequate volume of blood received in culture bottles   Culture   Final    NO GROWTH 5 DAYS Performed at [facility], [address] [place], [place] [address]    Report Status 11/25/2022 FINAL  Final  Culture, blood (Routine X 2) w Reflex to ID Panel     Status: None   Collection Time: 11/20/22  2:07 PM   Specimen: BLOOD RIGHT HAND  Result Value Ref Range Status   Specimen Description BLOOD RIGHT HAND  Final   Special Requests   Final    BOTTLES DRAWN AEROBIC ONLY Blood Culture results may not be optimal due to an inadequate volume of blood received in culture bottles   Culture   Final    NO GROWTH 5 DAYS Performed at  [facility], [address] [place], [place] [address]    Report Status 11/25/2022 FINAL  Final  Remove and replace urinary cath (placed > 5 days) then obtain urine culture from new indwelling urinary catheter.     Status: Abnormal   Collection Time: 11/22/22  9:23 AM   Specimen: Urine, Catheterized  Result Value Ref Range Status   Specimen Description URINE, CATHETERIZED  Final   Special Requests   Final    NONE Performed at [facility], [address], [place], [place] [address]    Culture (A)  Final    5,000 COLONIES/mL ESCHERICHIA COLI Confirmed Extended Spectrum Beta-Lactamase Producer (ESBL).  In bloodstream infections from ESBL organisms, carbapenems are preferred over piperacillin/tazobactam. They are shown to have a lower risk of mortality.    Report Status 11/26/2022 FINAL  Final   Organism ID, Bacteria ESCHERICHIA COLI (A)  Final      Susceptibility   Escherichia coli - MIC*    AMPICILLIN >=32 RESISTANT Resistant     CEFAZOLIN >=64 RESISTANT Resistant     CEFEPIME >=32 RESISTANT Resistant     CEFTRIAXONE >=64 RESISTANT Resistant     CIPROFLOXACIN >=4 RESISTANT Resistant     GENTAMICIN <=1 SENSITIVE Sensitive     IMIPENEM 0.5 SENSITIVE Sensitive     NITROFURANTOIN <=16 SENSITIVE Sensitive     TRIMETH/SULFA <=20 SENSITIVE Sensitive     AMPICILLIN/SULBACTAM >=32 RESISTANT Resistant     PIP/TAZO >=128 RESISTANT Resistant     * 5,000 COLONIES/mL ESCHERICHIA COLI     Radiology Studies: No results found.  Scheduled Meds:  apixaban  5 mg Oral BID   carvedilol  3.125 mg Oral BID WC   insulin aspart  0-5 Units Subcutaneous QHS   insulin aspart  0-9 Units Subcutaneous TID WC   lacosamide  50 mg Oral BID   levETIRAcetam  1,500 mg Oral BID   sacubitril-valsartan  1 tablet Oral BID   Continuous Infusions:  LOS: 13 days   Hughie Closs, MD [facility]  11/29/2022, 11:35 AM   *Please note that this is a verbal dictation therefore any spelling  or grammatical errors are due to the "[employer]agon [employer] One" system interpretation.  Please page via Amion and do not message via secure chat for urgent patient care matters. Secure chat can be used for non urgent patient care matters.  How to contact the [facility] Attending or Consulting provider 7A - 7P or covering provider during after hours 7P -7A, for this patient?  Check the care team in [facility] and look for a) attending/consulting [facility] provider listed and b) the [facility] team listed. Page or secure chat 7A-7P. Log into [email or web address] and use [facility]'s universal password to access. If you do not have the password, please contact the hospital operator. Locate the [facility] provider you are looking for under [facility] and page to a number that you can be directly reached. If you still have difficulty reaching the provider, please page the [facility] (Director on Call) for the Hospitalists listed on amion for assistance.

## 2022-11-30 ENCOUNTER — Inpatient Hospital Stay (HOSPITAL_COMMUNITY): Payer: Medicare PPO

## 2022-11-30 DIAGNOSIS — R569 Unspecified convulsions: Secondary | ICD-10-CM | POA: Diagnosis not present

## 2022-11-30 DIAGNOSIS — R609 Edema, unspecified: Secondary | ICD-10-CM | POA: Diagnosis not present

## 2022-11-30 LAB — GLUCOSE, CAPILLARY
Glucose-Capillary: 107 mg/dL — ABNORMAL HIGH (ref 70–99)
Glucose-Capillary: 106 mg/dL — ABNORMAL HIGH (ref 70–99)
Glucose-Capillary: 138 mg/dL — ABNORMAL HIGH (ref 70–99)
Glucose-Capillary: 143 mg/dL — ABNORMAL HIGH (ref 70–99)

## 2022-11-30 NOTE — Plan of Care (Signed)
  Problem: Fluid Volume: Goal: Ability to maintain a balanced intake and output will improve Outcome: Progressing   Problem: Metabolic: Goal: Ability to maintain appropriate glucose levels will improve Outcome: Progressing   Problem: Tissue Perfusion: Goal: Adequacy of tissue perfusion will improve Outcome: Progressing   Problem: Clinical Measurements: Goal: Will remain free from infection Outcome: Progressing Goal: Diagnostic test results will improve Outcome: Progressing Goal: Respiratory complications will improve Outcome: Progressing Goal: Cardiovascular complication will be avoided Outcome: Progressing   Problem: Activity: Goal: Risk for activity intolerance will decrease Outcome: Progressing   Problem: Nutrition: Goal: Adequate nutrition will be maintained Outcome: Progressing   Problem: Pain Managment: Goal: General experience of comfort will improve Outcome: Progressing   Problem: Safety: Goal: Ability to remain free from injury will improve Outcome: Progressing

## 2022-11-30 NOTE — Progress Notes (Signed)
PROGRESS NOTE    Jocelyn Sanchez  ZOX:[id number] DOB: 12-27-1955 DOA: 11/16/2022 PCP: Bethanie Dicker, FNP   Brief Narrative:  Jocelyn Sanchez is a 67 y.o. female with PMH significant for multiple sclerosis with quadriplegia/bedbound status, chronic sacral ulcer, prior PE on Eliquis, anemia, DM2, hypertension, lymphedema.   5/30, patient was brought to the ED by EMS with seizures. No history of seizure,.  Family noted patient was unwell, not responding and foaming from her mouth.. EMS noted rhythmic shaking of all extremities as well as horizontal rhythmic moving of eyes.  She was given IM Versed.  Remained tachycardic and had notable pinpoint pupil.  Narcan was given without improvement.  Patient was given BMV ventilation and brought to the ED.   DNR   CT head with asymmetric edema in the left cerebral hemisphere possibly secondary to postictal state, but early large left MCA territory infarct is also possible.  Started on Keppra.  Neurology consulted Admitted to ICU for status epilepticus 5/31, patient had prolonged seizure and EEG.   5/31, PCCM Dr. Tonia Brooms had an extensive bedside discussion with patient's family regarding her current circumstances, poor prognosis and poor quality of life.  Family made a choice to transition her to full comfort care Transferred to [facility]  Assessment & Plan:   Principal Problem:   Seizure [facility]) Active Problems:   Acute metabolic encephalopathy  New onset seizure / Status epilepticus Primarily admitted for seizure.   AEDs to continue to prevent further seizures Currently on oral Keppra, Vimpat and as needed Versed IV.  No reports of problem with swallowing.   Acute metabolic encephalopathy Family states he had baseline normal cognitive function.   Patient had prolonged seizure leading to prolonged postictal state and altered mental status. Family has noticed gradual improvement and is hopeful for meaningful recovery.  Per notes from previous  hospitalist, she was only able to open eyes on commands but mumbles.  Since I assumed her care on 11/22/2022, gradually patient has improved significantly and is back to baseline for last few days.   Multiple sclerosis with quadriplegia/bedbound status Chronic sacral ulcer with hx of quadriplegia (POA) Continue supportive care Local would care  Pressure alleviating devices    SIRS secondary to presumed UTI, patient with history of ESBL UTI 11/08/2022, treated with Zosyn. 6/3, patient was tachycardic, WBC count was elevated.  Urinalysis showed turbid yellow urine with positive nitrate, large hemoglobin, moderate leukocytes and many bacteria.  She was started on Zosyn for the treatment of UTI.  Urine culture was not sent.  I ordered urine culture but it was never sent again.  Patient completed 5 days of Zosyn.  Hypokalemia Resolved.  History of PE: Continue Eliquis.  H/o combined systolic and diastolic CHF/ Essential hypertension Stable.  Continue home medications which is Coreg and Entresto.     Type 2 diabetes Last hemoglobin A1c documented in the chart was 3 years ago which is 6.7.  Hemoglobin A1c at this time was 5.2.  Blood sugar very well-controlled.  She has not required any insulin per SSI.  Will discontinue that.  Right upper extremity swelling: Patient's right upper extremity is edematous.  Nontender.  No erythema.  Likely dependent edema.  She has no power in right and left upper extremity.  Will order Doppler ultrasound to rule out DVT.  This needs to be kept elevated.  Goal of care: Family meeting took place with palliative care on 11/22/2022, they decided in favor of no artificial feeding or hydration now  or in the future, comfort feeds as tolerated.  Family is hopeful for SNF for long-term care and palliative services.  They are not interested in hospice at this time.  Appreciate palliative care help.  DVT prophylaxis: Eliquis   Code Status: DNR  Family Communication: None present  at bedside.    Status is: Inpatient Remains inpatient appropriate because: Medically stable now.  Did P2P on 11/28/2022 but patient was declined.  Waiting for family to decide.   Estimated body mass index is 22.3 kg/m as calculated from the following:   Height as of 11/08/22: 5\' 3"  (1.6 m).   Weight as of this encounter: 57.1 kg.  Pressure Injury 11/09/22 Heel Left Deep Tissue Pressure Injury - Purple or maroon localized area of discolored intact skin or blood-filled blister due to damage of underlying soft tissue from pressure and/or shear. 2 cm x 3 cm (Active)  11/09/22 0700  Location: Heel  Location Orientation: Left  Staging: Deep Tissue Pressure Injury - Purple or maroon localized area of discolored intact skin or blood-filled blister due to damage of underlying soft tissue from pressure and/or shear.  Wound Description (Comments): 2 cm x 3 cm  Present on Admission:   Dressing Type Foam - Lift dressing to assess site every shift 11/27/22 0900     Pressure Injury 11/09/22 Buttocks Right Stage 3 -  Full thickness tissue loss. Subcutaneous fat may be visible but bone, tendon or muscle are NOT exposed. 2 cm x 3 cm x 1 cm w/1.4 cm undermining from 12-3 o'clock (Active)  11/09/22 0700  Location: Buttocks  Location Orientation: Right  Staging: Stage 3 -  Full thickness tissue loss. Subcutaneous fat may be visible but bone, tendon or muscle are NOT exposed.  Wound Description (Comments): 2 cm x 3 cm x 1 cm w/1.4 cm undermining from 12-3 o'clock  Present on Admission: Yes  Dressing Type Gauze (Comment);ABD 11/29/22 2348     Pressure Injury 11/16/22 Elbow Anterior;Left Wound from arm contracture to elbow crevice, stage 2/ skin tear (Active)  11/16/22 2000  Location: Elbow  Location Orientation: Anterior;Left  Staging:   Wound Description (Comments): Wound from arm contracture to elbow crevice, stage 2/ skin tear  Present on Admission: Yes  Dressing Type None 11/27/22 0900   Nutritional  Assessment: Body mass index is 22.3 kg/m.Marland Kitchen Seen by dietician.  I agree with the assessment and plan as outlined below: Nutrition Status:        . Skin Assessment: I have examined the patient's skin and I agree with the wound assessment as performed by the wound care RN as outlined below: Pressure Injury 11/09/22 Heel Left Deep Tissue Pressure Injury - Purple or maroon localized area of discolored intact skin or blood-filled blister due to damage of underlying soft tissue from pressure and/or shear. 2 cm x 3 cm (Active)  11/09/22 0700  Location: Heel  Location Orientation: Left  Staging: Deep Tissue Pressure Injury - Purple or maroon localized area of discolored intact skin or blood-filled blister due to damage of underlying soft tissue from pressure and/or shear.  Wound Description (Comments): 2 cm x 3 cm  Present on Admission:   Dressing Type Foam - Lift dressing to assess site every shift 11/27/22 0900     Pressure Injury 11/09/22 Buttocks Right Stage 3 -  Full thickness tissue loss. Subcutaneous fat may be visible but bone, tendon or muscle are NOT exposed. 2 cm x 3 cm x 1 cm w/1.4 cm undermining from 12-3 o'clock (  Active)  11/09/22 0700  Location: Buttocks  Location Orientation: Right  Staging: Stage 3 -  Full thickness tissue loss. Subcutaneous fat may be visible but bone, tendon or muscle are NOT exposed.  Wound Description (Comments): 2 cm x 3 cm x 1 cm w/1.4 cm undermining from 12-3 o'clock  Present on Admission: Yes  Dressing Type Gauze (Comment);ABD 11/29/22 2348     Pressure Injury 11/16/22 Elbow Anterior;Left Wound from arm contracture to elbow crevice, stage 2/ skin tear (Active)  11/16/22 2000  Location: Elbow  Location Orientation: Anterior;Left  Staging:   Wound Description (Comments): Wound from arm contracture to elbow crevice, stage 2/ skin tear  Present on Admission: Yes  Dressing Type None 11/27/22 0900    Consultants:  Palliative care  Procedures:   None  Antimicrobials:  Anti-infectives (From admission, onward)    Start     Dose/Rate Route Frequency Ordered Stop   11/20/22 1400  piperacillin-tazobactam (ZOSYN) IVPB 3.375 g        3.375 g 12.5 mL/hr over 240 Minutes Intravenous Every 8 hours 11/20/22 1251 11/25/22 0904         Subjective: Patient seen and examined.  Fully alert and mostly oriented.  Asking very reasonable questions and engaging in conversation as well.  She keeps asking me when she will be discharged home.  Objective: Vitals:   11/29/22 1656 11/29/22 2111 11/30/22 0550 11/30/22 0740  BP: 134/72 122/67 131/70 111/69  Pulse: 93 100 (!) 108 (!) 105  Resp: 16 18 18 16   Temp: 98.3 F (36.8 C) 98.3 F (36.8 C) 98.4 F (36.9 C) 98.6 F (37 C)  TempSrc: Oral   Oral  SpO2: 100% 100% 100% 99%  Weight:        Intake/Output Summary (Last 24 hours) at 11/30/2022 1027 Last data filed at 11/30/2022 0511 Gross per 24 hour  Intake 60 ml  Output 1400 ml  Net -1340 ml    Filed Weights   11/16/22 1911 11/17/22 0420  Weight: 57.1 kg 57.1 kg    Examination:  General exam: Appears calm and comfortable  Respiratory system: Clear to auscultation. Respiratory effort normal. Cardiovascular system: S1 & S2 heard, RRR. No JVD, murmurs, rubs, gallops or clicks. No pedal edema. Gastrointestinal system: Abdomen is nondistended, soft and nontender. No organomegaly or masses felt. Normal bowel sounds heard. Central nervous system: Alert and oriented.  Quadriplegic   Data Reviewed: I have personally reviewed following labs and imaging studies  CBC: Recent Labs  Lab 11/24/22 1058  WBC 10.3  NEUTROABS 7.5  HGB 12.1  HCT 39.7  MCV 86.7  PLT 510*    Basic Metabolic Panel: Recent Labs  Lab 11/24/22 1058 11/26/22 0731  NA 138 136  K 4.1 4.4  CL 114* 114*  CO2 15* 15*  GLUCOSE 116* 114*  BUN 10 11  CREATININE 0.65 0.67  CALCIUM 8.3* 8.3*    GFR: Estimated Creatinine Clearance: 55.7 mL/min (by C-G  formula based on SCr of 0.67 mg/dL). Liver Function Tests: No results for input(s): "AST", "ALT", "ALKPHOS", "BILITOT", "PROT", "ALBUMIN" in the last 168 hours.  No results for input(s): "LIPASE", "AMYLASE" in the last 168 hours. No results for input(s): "AMMONIA" in the last 168 hours. Coagulation Profile: No results for input(s): "INR", "PROTIME" in the last 168 hours. Cardiac Enzymes: No results for input(s): "CKTOTAL", "CKMB", "CKMBINDEX", "TROPONINI" in the last 168 hours. BNP (last 3 results) No results for input(s): "PROBNP" in the last 8760 hours. HbA1C: No results for  input(s): "HGBA1C" in the last 72 hours.  CBG: Recent Labs  Lab 11/29/22 0809 11/29/22 1246 11/29/22 1656 11/29/22 2036 11/30/22 0741  GLUCAP 115* 127* 154* 134* 107*    Lipid Profile: No results for input(s): "CHOL", "HDL", "LDLCALC", "TRIG", "CHOLHDL", "LDLDIRECT" in the last 72 hours. Thyroid Function Tests: No results for input(s): "TSH", "T4TOTAL", "FREET4", "T3FREE", "THYROIDAB" in the last 72 hours. Anemia Panel: No results for input(s): "VITAMINB12", "FOLATE", "FERRITIN", "TIBC", "IRON", "RETICCTPCT" in the last 72 hours. Sepsis Labs: No results for input(s): "PROCALCITON", "LATICACIDVEN" in the last 168 hours.   Recent Results (from the past 240 hour(s))  Culture, blood (Routine X 2) w Reflex to ID Panel     Status: None   Collection Time: 11/20/22  2:06 PM   Specimen: BLOOD RIGHT HAND  Result Value Ref Range Status   Specimen Description BLOOD RIGHT HAND  Final   Special Requests   Final    BOTTLES DRAWN AEROBIC ONLY Blood Culture results may not be optimal due to an inadequate volume of blood received in culture bottles   Culture   Final    NO GROWTH 5 DAYS Performed at [facility], [address] [place], [place] [address]    Report Status 11/25/2022 FINAL  Final  Culture, blood (Routine X 2) w Reflex to ID Panel     Status: None   Collection Time: 11/20/22  2:07 PM    Specimen: BLOOD RIGHT HAND  Result Value Ref Range Status   Specimen Description BLOOD RIGHT HAND  Final   Special Requests   Final    BOTTLES DRAWN AEROBIC ONLY Blood Culture results may not be optimal due to an inadequate volume of blood received in culture bottles   Culture   Final    NO GROWTH 5 DAYS Performed at [facility], [address], [place], [place] [address]    Report Status 11/25/2022 FINAL  Final  Remove and replace urinary cath (placed > 5 days) then obtain urine culture from new indwelling urinary catheter.     Status: Abnormal   Collection Time: 11/22/22  9:23 AM   Specimen: Urine, Catheterized  Result Value Ref Range Status   Specimen Description URINE, CATHETERIZED  Final   Special Requests   Final    NONE Performed at [facility], [address], [place], [place] [address]    Culture (A)  Final    5,000 COLONIES/mL ESCHERICHIA COLI Confirmed Extended Spectrum Beta-Lactamase Producer (ESBL).  In bloodstream infections from ESBL organisms, carbapenems are preferred over piperacillin/tazobactam. They are shown to have a lower risk of mortality.    Report Status 11/26/2022 FINAL  Final   Organism ID, Bacteria ESCHERICHIA COLI (A)  Final      Susceptibility   Escherichia coli - MIC*    AMPICILLIN >=32 RESISTANT Resistant     CEFAZOLIN >=64 RESISTANT Resistant     CEFEPIME >=32 RESISTANT Resistant     CEFTRIAXONE >=64 RESISTANT Resistant     CIPROFLOXACIN >=4 RESISTANT Resistant     GENTAMICIN <=1 SENSITIVE Sensitive     IMIPENEM 0.5 SENSITIVE Sensitive     NITROFURANTOIN <=16 SENSITIVE Sensitive     TRIMETH/SULFA <=20 SENSITIVE Sensitive     AMPICILLIN/SULBACTAM >=32 RESISTANT Resistant     PIP/TAZO >=128 RESISTANT Resistant     * 5,000 COLONIES/mL ESCHERICHIA COLI     Radiology Studies: No results found.  Scheduled Meds:  apixaban  5 mg Oral BID   carvedilol  3.125 mg Oral BID WC   insulin aspart  0-5 Units Subcutaneous QHS   insulin  aspart  0-9 Units Subcutaneous TID WC   lacosamide  50 mg Oral BID   levETIRAcetam  1,500 mg Oral BID   sacubitril-valsartan  1 tablet Oral BID   Continuous Infusions:     LOS: 14 days   Hughie Closs, MD [facility]  11/30/2022, 10:27 AM   *Please note that this is a verbal dictation therefore any spelling or grammatical errors are due to the "Dragon Medical One" system interpretation.  Please page via Amion and do not message via secure chat for urgent patient care matters. Secure chat can be used for non urgent patient care matters.  How to contact the [facility] Attending or Consulting provider 7A - 7P or covering provider during after hours 7P -7A, for this patient?  Check the care team in [facility] and look for a) attending/consulting TRH provider listed and b) the [facility] team listed. Page or secure chat 7A-7P. Log into [email or web address] and use [place]'s universal password to access. If you do not have the password, please contact the hospital operator. Locate the [facility] provider you are looking for under [facility] and page to a number that you can be directly reached. If you still have difficulty reaching the provider, please page the [facility] (Director on Call) for the Hospitalists listed on amion for assistance.

## 2022-11-30 NOTE — Progress Notes (Signed)
RUE venous duplex has been completed.   Results can be found under chart review under CV PROC. 11/30/2022 3:37 PM Crystallee Werden RVT, RDMS

## 2022-11-30 NOTE — TOC Progression Note (Addendum)
Transition of Care [facility]) - Progression Note    Patient Details  Name: Jocelyn Sanchez MRN: [id number] Date of Birth: 1955/11/11  Transition of Care [facility]) CM/SW Contact  Jocelyn Sanchez, [place] Phone Number: 11/30/2022, 11:35 AM  Clinical Narrative:    CSW reached out pt's sister Jocelyn Sanchez, regarding updated level of care recommendation for pt. She was in agreement for inpatient hospice placement. [facility] made aware of conversation and said will reach out to pt's family regarding placement and discharge plan.  CSW will continue to follow.   CSW updated Jocelyn Sanchez regarding placement at [facility]. Pt now recommended for Inpatient Hospice with full comfort care measures. Jocelyn Sanchez informed of pt recommendation update.   TOC will continue to follow.   CSW contacted Jocelyn Sanchez with [facility] to get update on [facility] facility. She requested CSW resend referral for consideration. CSW sent and will follow up regarding status.   TOC will continue to follow.   Jocelyn Sanchez informed CSW that she visited [facility]. She said that facility was acceptable. CSW will follow up with their intake team regarding bed acceptance.   TOC will continue to follow.   CSW contacted pt's sister, Jocelyn Sanchez, to provide update on pt's insurance decline. CSW provided information for appeal.  Appeals no. is [phone] Fax no. [phone].   CSW said that [facility] cannot take pt long term and suggested [facility] where pt has been in the past. Pt was declined due to not meeting long term care criteria.   CSW informed Jocelyn Sanchez that [facility] made an offer for placement. She said "Oh no" reporting she had been to facility in the past and did not like it.   CSW said [facility] and [facility] provided long term care beds and she said CSW would send referral information to facilities. CSW provided contact information for liaison so she can schedule a tour. She stated she would update CSW regarding  decisions on facilities.   TOC will continue to follow.    Expected Discharge Plan: Skilled Nursing Facility Barriers to Discharge: English as a second language teacher, SNF Pending bed offer  Expected Discharge Plan and Services   Discharge Planning Services: CM Consult Post Acute Care Choice: Resumption of Svcs/PTA Provider Living arrangements for the past 2 months: Single Family Home                             HH Agency: [facility]     Representative spoke with at [facility] Agency: Patient is currently active with [facility] for RN for wound care for chronic sacral wound   Social Determinants of Health (SDOH) Interventions SDOH Screenings   Housing: Patient Unable To Answer (11/09/2022)  Tobacco Use: Medium Risk (11/13/2022)    Readmission Risk Interventions    11/20/2022   12:01 PM 11/14/2022    2:15 PM  Readmission Risk Prevention Plan  Transportation Screening Complete Complete  PCP or Specialist Appt within 5-7 Days Complete Complete  Home Care Screening Complete Complete  Medication Review (RN CM) Complete Complete

## 2022-11-30 NOTE — Progress Notes (Signed)
Patient ID: Jocelyn Sanchez, female   DOB: 1956-03-21, 67 y.o.   MRN: [id number]    Progress Note from the Palliative Medicine Team at [facility]   [name]        Date: 11/30/2022 DOB: 07-28-1955  Age: 67 y.o. MRN#: [id number] Attending Physician: Hughie Closs, MD Primary Care Physician: Bethanie Dicker, FNP Admit Date: 11/16/2022   Medical records reviewed    Jocelyn Sanchez is a 67 y.o. female with PMH significant for multiple sclerosis with quadriplegia/bedbound status, chronic sacral ulcer, prior PE on Eliquis, anemia, DM2, hypertension, lymphedema.   Patient has had continued physical, functional and cognitive decline over the past many years 2/2 to MS disease progression.  Palliative care  asked to get involved to support further goals of care conversations in the setting of multiple chronic comorbid conditions and new onset seizures.    This NP assessed patient at the bedside as a follow up for palliative medicine needs and emotional support.   Patient is alert and communicating verbally  Communication is difficult secondary to disease progression.  She  appears comfortable  I left phone message for son /Jocelyn Sanchez, await callback    Family are working with  transition of care team regarding SNF placement   Plan of care: -DNR/DNI -no artifical feeding or hydration now or in the future - comfort feeds as tolerated, family understand know risk for aspiration -family is hopeful for SNF for long term care and Palliative services, not interested in hospice at his time -avoid re-hospitalizations  MOST form completed last week, reflect above goals of care   Discussed with Jocelyn [place] LCSW/ TOC team via secure chat  PMT available for questions or concerns    Time: 25 minutes  Detailed review of medical records ( labs, imaging, vital signs), medically appropriate exam ( MS, skin, resp)   discussed with treatment team, counseling and education to patient,  family, staff, documenting clinical information, medication management, coordination of care    Lorinda Creed NP  Palliative Medicine Team Team Phone # [phone] Pager [phone]

## 2022-12-01 DIAGNOSIS — R569 Unspecified convulsions: Secondary | ICD-10-CM | POA: Diagnosis not present

## 2022-12-01 LAB — GLUCOSE, CAPILLARY
Glucose-Capillary: 133 mg/dL — ABNORMAL HIGH (ref 70–99)
Glucose-Capillary: 73 mg/dL (ref 70–99)
Glucose-Capillary: 109 mg/dL — ABNORMAL HIGH (ref 70–99)
Glucose-Capillary: 156 mg/dL — ABNORMAL HIGH (ref 70–99)

## 2022-12-01 NOTE — Progress Notes (Signed)
PROGRESS NOTE    Jocelyn Sanchez  ZOX:[id number] DOB: Dec 18, 1955 DOA: 11/16/2022 PCP: Bethanie Dicker, FNP   Brief Narrative:  Jocelyn Sanchez is a 67 y.o. female with PMH significant for multiple sclerosis with quadriplegia/bedbound status, chronic sacral ulcer, prior PE on Eliquis, anemia, DM2, hypertension, lymphedema.   5/30, patient was brought to the ED by EMS with seizures. No history of seizure,.  Family noted patient was unwell, not responding and foaming from her mouth.. EMS noted rhythmic shaking of all extremities as well as horizontal rhythmic moving of eyes.  She was given IM Versed.  Remained tachycardic and had notable pinpoint pupil.  Narcan was given without improvement.  Patient was given BMV ventilation and brought to the ED.   DNR   CT head with asymmetric edema in the left cerebral hemisphere possibly secondary to postictal state, but early large left MCA territory infarct is also possible.  Started on Keppra.  Neurology consulted Admitted to ICU for status epilepticus 5/31, patient had prolonged seizure and EEG.   5/31, PCCM Dr. Tonia Brooms had an extensive bedside discussion with patient's family regarding her current circumstances, poor prognosis and poor quality of life.  Family made a choice to transition her to full comfort care Transferred to [facility]  Assessment & Plan:   Principal Problem:   Seizure [facility]) Active Problems:   Acute metabolic encephalopathy  New onset seizure / Status epilepticus Primarily admitted for seizure.   AEDs to continue to prevent further seizures Currently on oral Keppra, Vimpat and as needed Versed IV.  No reports of problem with swallowing.   Acute metabolic encephalopathy Family states he had baseline normal cognitive function.   Patient had prolonged seizure leading to prolonged postictal state and altered mental status. Family has noticed gradual improvement and is hopeful for meaningful recovery.  Per notes from previous  hospitalist, she was only able to open eyes on commands but mumbles.  Since I assumed her care on 11/22/2022, gradually patient has improved significantly and is back to baseline for last few days.   Multiple sclerosis with quadriplegia/bedbound status Chronic sacral ulcer with hx of quadriplegia (POA) Continue supportive care Local would care  Pressure alleviating devices    SIRS secondary to presumed UTI, patient with history of ESBL UTI 11/08/2022, treated with Zosyn. 6/3, patient was tachycardic, WBC count was elevated.  Urinalysis showed turbid yellow urine with positive nitrate, large hemoglobin, moderate leukocytes and many bacteria.  She was started on Zosyn for the treatment of UTI.  Urine culture was not sent.  I ordered urine culture but it was never sent again.  Patient completed 5 days of Zosyn.  Hypokalemia Resolved.  History of PE: Continue Eliquis.  H/o combined systolic and diastolic CHF/ Essential hypertension Stable.  Continue home medications which is Coreg and Entresto.     Type 2 diabetes Last hemoglobin A1c documented in the chart was 3 years ago which is 6.7.  Hemoglobin A1c at this time was 5.2.  Blood sugar very well-controlled.  She has not required any insulin per SSI.  Will discontinue that.  Right upper extremity swelling: Doppler ruled out DVT.  Advised RN to keep right upper extremity elevated.  Goal of care: Family meeting took place with palliative care on 11/22/2022, they decided in favor of no artificial feeding or hydration now or in the future, comfort feeds as tolerated.  Family is hopeful for SNF for long-term care and palliative services.  They are not interested in hospice at this  time.  Appreciate palliative care help.  DVT prophylaxis: Eliquis   Code Status: DNR  Family Communication: None present at bedside.    Status is: Inpatient Remains inpatient appropriate because: Medically stable now.  Did P2P on 11/28/2022 but patient was declined.   Waiting for placement to SNF.   Estimated body mass index is 22.3 kg/m as calculated from the following:   Height as of 11/08/22: 5\' 3"  (1.6 m).   Weight as of this encounter: 57.1 kg.  Pressure Injury 11/09/22 Heel Left Deep Tissue Pressure Injury - Purple or maroon localized area of discolored intact skin or blood-filled blister due to damage of underlying soft tissue from pressure and/or shear. 2 cm x 3 cm (Active)  11/09/22 0700  Location: Heel  Location Orientation: Left  Staging: Deep Tissue Pressure Injury - Purple or maroon localized area of discolored intact skin or blood-filled blister due to damage of underlying soft tissue from pressure and/or shear.  Wound Description (Comments): 2 cm x 3 cm  Present on Admission:   Dressing Type Foam - Lift dressing to assess site every shift 11/30/22 2000     Pressure Injury 11/09/22 Buttocks Right Stage 3 -  Full thickness tissue loss. Subcutaneous fat may be visible but bone, tendon or muscle are NOT exposed. 2 cm x 3 cm x 1 cm w/1.4 cm undermining from 12-3 o'clock (Active)  11/09/22 0700  Location: Buttocks  Location Orientation: Right  Staging: Stage 3 -  Full thickness tissue loss. Subcutaneous fat may be visible but bone, tendon or muscle are NOT exposed.  Wound Description (Comments): 2 cm x 3 cm x 1 cm w/1.4 cm undermining from 12-3 o'clock  Present on Admission: Yes  Dressing Type Moist to dry 11/30/22 2000     Pressure Injury 11/16/22 Elbow Anterior;Left Wound from arm contracture to elbow crevice, stage 2/ skin tear (Active)  11/16/22 2000  Location: Elbow  Location Orientation: Anterior;Left  Staging:   Wound Description (Comments): Wound from arm contracture to elbow crevice, stage 2/ skin tear  Present on Admission: Yes  Dressing Type None 11/30/22 2000   Nutritional Assessment: Body mass index is 22.3 kg/m.Marland Kitchen Seen by dietician.  I agree with the assessment and plan as outlined below: Nutrition Status:         . Skin Assessment: I have examined the patient's skin and I agree with the wound assessment as performed by the wound care RN as outlined below: Pressure Injury 11/09/22 Heel Left Deep Tissue Pressure Injury - Purple or maroon localized area of discolored intact skin or blood-filled blister due to damage of underlying soft tissue from pressure and/or shear. 2 cm x 3 cm (Active)  11/09/22 0700  Location: Heel  Location Orientation: Left  Staging: Deep Tissue Pressure Injury - Purple or maroon localized area of discolored intact skin or blood-filled blister due to damage of underlying soft tissue from pressure and/or shear.  Wound Description (Comments): 2 cm x 3 cm  Present on Admission:   Dressing Type Foam - Lift dressing to assess site every shift 11/30/22 2000     Pressure Injury 11/09/22 Buttocks Right Stage 3 -  Full thickness tissue loss. Subcutaneous fat may be visible but bone, tendon or muscle are NOT exposed. 2 cm x 3 cm x 1 cm w/1.4 cm undermining from 12-3 o'clock (Active)  11/09/22 0700  Location: Buttocks  Location Orientation: Right  Staging: Stage 3 -  Full thickness tissue loss. Subcutaneous fat may be visible but bone,  tendon or muscle are NOT exposed.  Wound Description (Comments): 2 cm x 3 cm x 1 cm w/1.4 cm undermining from 12-3 o'clock  Present on Admission: Yes  Dressing Type Moist to dry 11/30/22 2000     Pressure Injury 11/16/22 Elbow Anterior;Left Wound from arm contracture to elbow crevice, stage 2/ skin tear (Active)  11/16/22 2000  Location: Elbow  Location Orientation: Anterior;Left  Staging:   Wound Description (Comments): Wound from arm contracture to elbow crevice, stage 2/ skin tear  Present on Admission: Yes  Dressing Type None 11/30/22 2000    Consultants:  Palliative care  Procedures:  None  Antimicrobials:  Anti-infectives (From admission, onward)    Start     Dose/Rate Route Frequency Ordered Stop   11/20/22 1400   piperacillin-tazobactam (ZOSYN) IVPB 3.375 g        3.375 g 12.5 mL/hr over 240 Minutes Intravenous Every 8 hours 11/20/22 1251 11/25/22 0904         Subjective: Seen and examined.  No new complaint.  Still desires to go home.  Objective: Vitals:   11/30/22 1554 11/30/22 2128 12/01/22 0508 12/01/22 0833  BP: (!) 151/96 115/65 117/62 122/81  Pulse: (!) 104 (!) 101 (!) 103 (!) 105  Resp: 15 16 16 18   Temp: 98.7 F (37.1 C) 98.8 F (37.1 C) (!) 97.4 F (36.3 C) (!) 97.3 F (36.3 C)  TempSrc: Oral Oral Oral   SpO2: 100% 100% 99% 100%  Weight:        Intake/Output Summary (Last 24 hours) at 12/01/2022 1150 Last data filed at 12/01/2022 1000 Gross per 24 hour  Intake 100 ml  Output 850 ml  Net -750 ml    Filed Weights   11/16/22 1911 11/17/22 0420  Weight: 57.1 kg 57.1 kg    Examination:  General exam: Appears calm and comfortable  Respiratory system: Clear to auscultation. Respiratory effort normal. Cardiovascular system: S1 & S2 heard, RRR. No JVD, murmurs, rubs, gallops or clicks. No pedal edema. Gastrointestinal system: Abdomen is nondistended, soft and nontender. No organomegaly or masses felt. Normal bowel sounds heard. Central nervous system: Alert and oriented.  Quadriplegic   Data Reviewed: I have personally reviewed following labs and imaging studies  CBC: No results for input(s): "WBC", "NEUTROABS", "HGB", "HCT", "MCV", "PLT" in the last 168 hours.  Basic Metabolic Panel: Recent Labs  Lab 11/26/22 0731  NA 136  K 4.4  CL 114*  CO2 15*  GLUCOSE 114*  BUN 11  CREATININE 0.67  CALCIUM 8.3*    GFR: Estimated Creatinine Clearance: 55.7 mL/min (by C-G formula based on SCr of 0.67 mg/dL). Liver Function Tests: No results for input(s): "AST", "ALT", "ALKPHOS", "BILITOT", "PROT", "ALBUMIN" in the last 168 hours.  No results for input(s): "LIPASE", "AMYLASE" in the last 168 hours. No results for input(s): "AMMONIA" in the last 168 hours. Coagulation  Profile: No results for input(s): "INR", "PROTIME" in the last 168 hours. Cardiac Enzymes: No results for input(s): "CKTOTAL", "CKMB", "CKMBINDEX", "TROPONINI" in the last 168 hours. BNP (last 3 results) No results for input(s): "PROBNP" in the last 8760 hours. HbA1C: No results for input(s): "HGBA1C" in the last 72 hours.  CBG: Recent Labs  Lab 11/30/22 0741 11/30/22 1152 11/30/22 1701 11/30/22 2038 12/01/22 0834  GLUCAP 107* 106* 138* 143* 133*    Lipid Profile: No results for input(s): "CHOL", "HDL", "LDLCALC", "TRIG", "CHOLHDL", "LDLDIRECT" in the last 72 hours. Thyroid Function Tests: No results for input(s): "TSH", "T4TOTAL", "FREET4", "T3FREE", "THYROIDAB"  in the last 72 hours. Anemia Panel: No results for input(s): "VITAMINB12", "FOLATE", "FERRITIN", "TIBC", "IRON", "RETICCTPCT" in the last 72 hours. Sepsis Labs: No results for input(s): "PROCALCITON", "LATICACIDVEN" in the last 168 hours.   Recent Results (from the past 240 hour(s))  Remove and replace urinary cath (placed > 5 days) then obtain urine culture from new indwelling urinary catheter.     Status: Abnormal   Collection Time: 11/22/22  9:23 AM   Specimen: Urine, Catheterized  Result Value Ref Range Status   Specimen Description URINE, CATHETERIZED  Final   Special Requests   Final    NONE Performed at [facility], [address], [place], [place] [address]    Culture (A)  Final    5,000 COLONIES/mL ESCHERICHIA COLI Confirmed Extended Spectrum Beta-Lactamase Producer (ESBL).  In bloodstream infections from ESBL organisms, carbapenems are preferred over piperacillin/tazobactam. They are shown to have a lower risk of mortality.    Report Status 11/26/2022 FINAL  Final   Organism ID, Bacteria ESCHERICHIA COLI (A)  Final      Susceptibility   Escherichia coli - MIC*    AMPICILLIN >=32 RESISTANT Resistant     CEFAZOLIN >=64 RESISTANT Resistant     CEFEPIME >=32 RESISTANT Resistant     CEFTRIAXONE  >=64 RESISTANT Resistant     CIPROFLOXACIN >=4 RESISTANT Resistant     GENTAMICIN <=1 SENSITIVE Sensitive     IMIPENEM 0.5 SENSITIVE Sensitive     NITROFURANTOIN <=16 SENSITIVE Sensitive     TRIMETH/SULFA <=20 SENSITIVE Sensitive     AMPICILLIN/SULBACTAM >=32 RESISTANT Resistant     PIP/TAZO >=128 RESISTANT Resistant     * 5,000 COLONIES/mL ESCHERICHIA COLI     Radiology Studies: VAS [place] UPPER EXTREMITY VENOUS DUPLEX  Result Date: 11/30/2022 UPPER VENOUS STUDY  Patient Name:  Jocelyn Sanchez  Date of Exam:   11/30/2022 Medical Rec #: [id number]         Accession #:    [id number] Date of Birth: 1956/03/22          Patient Gender: F Patient Age:   26 years Exam Location:  [facility] Procedure:      VAS [place] UPPER EXTREMITY VENOUS DUPLEX Referring Phys: Emalynn Clewis --------------------------------------------------------------------------------  Indications: Edema Risk Factors: HX of PE. Anticoagulation: Eliquis. Limitations: Patient immobility (quad. Comparison Study: Previous exam on 08/02/19 was positive for SVT (LUE cephalic                   vein) Performing Technologist: Ernestene Mention RVT, RDMS  Examination Guidelines: A complete evaluation includes B-mode imaging, spectral Doppler, color Doppler, and power Doppler as needed of all accessible portions of each vessel. Bilateral testing is considered an integral part of a complete examination. Limited examinations for reoccurring indications may be performed as noted.  Right Findings: +----------+------------+---------+-----------+----------+--------------+ RIGHT     CompressiblePhasicitySpontaneousProperties   Summary     +----------+------------+---------+-----------+----------+--------------+ IJV           Full       Yes       Yes                             +----------+------------+---------+-----------+----------+--------------+ Subclavian    Full       Yes       Yes                              +----------+------------+---------+-----------+----------+--------------+  Axillary      Full       Yes       Yes                             +----------+------------+---------+-----------+----------+--------------+ Brachial      Full       Yes       Yes                             +----------+------------+---------+-----------+----------+--------------+ Radial        Full                                                 +----------+------------+---------+-----------+----------+--------------+ Ulnar                                               Not visualized +----------+------------+---------+-----------+----------+--------------+ Cephalic      Full                                                 +----------+------------+---------+-----------+----------+--------------+ Basilic       Full       Yes       Yes                             +----------+------------+---------+-----------+----------+--------------+  Left Findings: +----------+------------+---------+-----------+----------+-------+ LEFT      CompressiblePhasicitySpontaneousPropertiesSummary +----------+------------+---------+-----------+----------+-------+ Subclavian    Full       Yes       Yes                      +----------+------------+---------+-----------+----------+-------+  Summary:  Right: No evidence of deep vein thrombosis in the upper extremity. No evidence of superficial vein thrombosis in the upper extremity. However, unable to visualize the ulnar veins. Diffuse subcutaneous edema extending from distal upper arm into hand.  Left: No evidence of thrombosis in the subclavian.  *See table(s) above for measurements and observations.  Diagnosing physician: Sherald Hess MD Electronically signed by Sherald Hess MD on 11/30/2022 at 3:55:11 PM.    Final     Scheduled Meds:  apixaban  5 mg Oral BID   carvedilol  3.125 mg Oral BID WC   insulin aspart  0-5 Units Subcutaneous QHS   insulin  aspart  0-9 Units Subcutaneous TID WC   lacosamide  50 mg Oral BID   levETIRAcetam  1,500 mg Oral BID   sacubitril-valsartan  1 tablet Oral BID   Continuous Infusions:     LOS: 15 days   Hughie Closs, MD [facility]  12/01/2022, 11:50 AM   *Please note that this is a verbal dictation therefore any spelling or grammatical errors are due to the "Dragon [employer] One" system interpretation.  Please page via Amion and do not message via secure chat for urgent patient care matters. Secure chat can be used for non urgent patient care matters.  How to contact the [facility] Attending or Consulting provider 7A -  7P or covering provider during after hours [place], for this patient?  Check the care team in [facility] and look for a) attending/consulting TR[facility] provider listed and b) the [facility] team listed. Page or secure chat 7A-7P. Log into [email or web address] and use [facility] universal password to access. If you do not have the password, please contact the hospital operator. Locate the [facility] provider you are looking for under [facility] and page to a number that you can be directly reached. If you still have difficulty reaching the provider, please page the [facility] (Director on Call) for the Hospitalists listed on amion for assistance.

## 2022-12-01 NOTE — Plan of Care (Signed)
A/Ox1 to self only. No complaints of pain this shift. Total care provided. Sacral wound cleansed and changed wet to dry this morning. Foley care completed. Had BM overnight. No overnight issues    Problem: Education: Goal: Ability to describe self-care measures that may prevent or decrease complications (Diabetes Survival Skills Education) will improve Outcome: Adequate for Discharge   Problem: Coping: Goal: Ability to adjust to condition or change in health will improve Outcome: Adequate for Discharge   Problem: Fluid Volume: Goal: Ability to maintain a balanced intake and output will improve Outcome: Adequate for Discharge   Problem: Health Behavior/Discharge Planning: Goal: Ability to identify and utilize available resources and services will improve Outcome: Adequate for Discharge Goal: Ability to manage health-related needs will improve Outcome: Adequate for Discharge   Problem: Metabolic: Goal: Ability to maintain appropriate glucose levels will improve Outcome: Adequate for Discharge   Problem: Nutritional: Goal: Maintenance of adequate nutrition will improve Outcome: Adequate for Discharge Goal: Progress toward achieving an optimal weight will improve Outcome: Adequate for Discharge   Problem: Skin Integrity: Goal: Risk for impaired skin integrity will decrease Outcome: Adequate for Discharge   Problem: Tissue Perfusion: Goal: Adequacy of tissue perfusion will improve Outcome: Adequate for Discharge   Problem: Education: Goal: Knowledge of General Education information will improve Description: Including pain rating scale, medication(s)/side effects and non-pharmacologic comfort measures Outcome: Adequate for Discharge   Problem: Health Behavior/Discharge Planning: Goal: Ability to manage health-related needs will improve Outcome: Adequate for Discharge   Problem: Clinical Measurements: Goal: Ability to maintain clinical measurements within normal limits will  improve Outcome: Adequate for Discharge Goal: Will remain free from infection Outcome: Adequate for Discharge Goal: Diagnostic test results will improve Outcome: Adequate for Discharge Goal: Respiratory complications will improve Outcome: Adequate for Discharge Goal: Cardiovascular complication will be avoided Outcome: Adequate for Discharge   Problem: Activity: Goal: Risk for activity intolerance will decrease Outcome: Adequate for Discharge   Problem: Nutrition: Goal: Adequate nutrition will be maintained Outcome: Adequate for Discharge   Problem: Coping: Goal: Level of anxiety will decrease Outcome: Adequate for Discharge   Problem: Elimination: Goal: Will not experience complications related to bowel motility Outcome: Adequate for Discharge Goal: Will not experience complications related to urinary retention Outcome: Adequate for Discharge   Problem: Pain Managment: Goal: General experience of comfort will improve Outcome: Adequate for Discharge   Problem: Safety: Goal: Ability to remain free from injury will improve Outcome: Adequate for Discharge   Problem: Skin Integrity: Goal: Risk for impaired skin integrity will decrease Outcome: Adequate for Discharge

## 2022-12-01 NOTE — Discharge Instructions (Signed)

## 2022-12-02 DIAGNOSIS — R569 Unspecified convulsions: Secondary | ICD-10-CM | POA: Diagnosis not present

## 2022-12-02 LAB — GLUCOSE, CAPILLARY
Glucose-Capillary: 107 mg/dL — ABNORMAL HIGH (ref 70–99)
Glucose-Capillary: 131 mg/dL — ABNORMAL HIGH (ref 70–99)
Glucose-Capillary: 157 mg/dL — ABNORMAL HIGH (ref 70–99)

## 2022-12-02 NOTE — Progress Notes (Signed)
PROGRESS NOTE    Jocelyn Sanchez  ZOX:[id number] DOB: [birth year] DOA: 11/16/2022 PCP: Bethanie Dicker, FNP   Brief Narrative:  Jocelyn Sanchez is a 67 y.o. female with PMH significant for multiple sclerosis with quadriplegia/bedbound status, chronic sacral ulcer, prior PE on Eliquis, anemia, DM2, hypertension, lymphedema.   5/30, patient was brought to the ED by EMS with seizures. No history of seizure,.  Family noted patient was unwell, not responding and foaming from her mouth.. EMS noted rhythmic shaking of all extremities as well as horizontal rhythmic moving of eyes.  She was given IM Versed.  Remained tachycardic and had notable pinpoint pupil.  Narcan was given without improvement.  Patient was given BMV ventilation and brought to the ED.   DNR   CT head with asymmetric edema in the left cerebral hemisphere possibly secondary to postictal state, but early large left MCA territory infarct is also possible.  Started on Keppra.  Neurology consulted Admitted to ICU for status epilepticus 5/31, patient had prolonged seizure and EEG.   5/31, PCCM Dr. Tonia Brooms had an extensive bedside discussion with patient's family regarding her current circumstances, poor prognosis and poor quality of life.  Family made a choice to transition her to full comfort care Transferred to [facility]  Assessment & Plan:   Principal Problem:   Seizure [facility]) Active Problems:   Acute metabolic encephalopathy  New onset seizure / Status epilepticus Primarily admitted for seizure.   AEDs to continue to prevent further seizures Currently on oral Keppra, Vimpat and as needed Versed IV.  No reports of problem with swallowing.   Acute metabolic encephalopathy Family states he had baseline normal cognitive function.   Patient had prolonged seizure leading to prolonged postictal state and altered mental status. Family has noticed gradual improvement and is hopeful for meaningful recovery.  Per notes from previous  hospitalist, she was only able to open eyes on commands but mumbles.  Since I assumed her care on 11/22/2022, gradually patient has improved significantly and is back to baseline for last few days.   Multiple sclerosis with quadriplegia/bedbound status Chronic sacral ulcer with hx of quadriplegia (POA) Continue supportive care Local would care  Pressure alleviating devices    SIRS secondary to presumed UTI, patient with history of ESBL UTI 11/08/2022, treated with Zosyn. 6/3, patient was tachycardic, WBC count was elevated.  Urinalysis showed turbid yellow urine with positive nitrate, large hemoglobin, moderate leukocytes and many bacteria.  She was started on Zosyn for the treatment of UTI.  Urine culture was not sent.  I ordered urine culture but it was never sent again.  Patient completed 5 days of Zosyn.  Hypokalemia Resolved.  History of PE: Continue Eliquis.  H/o combined systolic and diastolic CHF/ Essential hypertension Stable.  Continue home medications which is Coreg and Entresto.     Type 2 diabetes Last hemoglobin A1c documented in the chart was 3 years ago which is 6.7.  Hemoglobin A1c at this time was 5.2.  Blood sugar very well-controlled.  She has not required any insulin per SSI.  Will discontinue that.  Right upper extremity swelling: Doppler ruled out DVT.  Advised RN to keep right upper extremity elevated.  Goal of care: Family meeting took place with palliative care on 11/22/2022, they decided in favor of no artificial feeding or hydration now or in the future, comfort feeds as tolerated.  Family is hopeful for SNF for long-term care and palliative services.  They are not interested in hospice at this  time.  Appreciate palliative care help.  DVT prophylaxis: Eliquis   Code Status: DNR  Family Communication: None present at bedside.    Status is: Inpatient Remains inpatient appropriate because: Medically stable now.  Did P2P on 11/28/2022 but patient was declined.   Waiting for placement to SNF.   Estimated body mass index is 22.3 kg/m as calculated from the following:   Height as of 11/08/22: 5\' 3"  (1.6 m).   Weight as of this encounter: 57.1 kg.  Pressure Injury 11/09/22 Heel Left Deep Tissue Pressure Injury - Purple or maroon localized area of discolored intact skin or blood-filled blister due to damage of underlying soft tissue from pressure and/or shear. 2 cm x 3 cm (Active)  11/09/22 0700  Location: Heel  Location Orientation: Left  Staging: Deep Tissue Pressure Injury - Purple or maroon localized area of discolored intact skin or blood-filled blister due to damage of underlying soft tissue from pressure and/or shear.  Wound Description (Comments): 2 cm x 3 cm  Present on Admission:   Dressing Type Foam - Lift dressing to assess site every shift 11/30/22 2000     Pressure Injury 11/09/22 Buttocks Right Stage 3 -  Full thickness tissue loss. Subcutaneous fat may be visible but bone, tendon or muscle are NOT exposed. 2 cm x 3 cm x 1 cm w/1.4 cm undermining from 12-3 o'clock (Active)  11/09/22 0700  Location: Buttocks  Location Orientation: Right  Staging: Stage 3 -  Full thickness tissue loss. Subcutaneous fat may be visible but bone, tendon or muscle are NOT exposed.  Wound Description (Comments): 2 cm x 3 cm x 1 cm w/1.4 cm undermining from 12-3 o'clock  Present on Admission: Yes  Dressing Type Gauze (Comment);ABD 12/01/22 2150     Pressure Injury 11/16/22 Elbow Anterior;Left Wound from arm contracture to elbow crevice, stage 2/ skin tear (Active)  11/16/22 2000  Location: Elbow  Location Orientation: Anterior;Left  Staging:   Wound Description (Comments): Wound from arm contracture to elbow crevice, stage 2/ skin tear  Present on Admission: Yes  Dressing Type None 11/30/22 2000   Nutritional Assessment: Body mass index is 22.3 kg/m.Marland Kitchen Seen by dietician.  I agree with the assessment and plan as outlined below: Nutrition Status:         . Skin Assessment: I have examined the patient's skin and I agree with the wound assessment as performed by the wound care RN as outlined below: Pressure Injury 11/09/22 Heel Left Deep Tissue Pressure Injury - Purple or maroon localized area of discolored intact skin or blood-filled blister due to damage of underlying soft tissue from pressure and/or shear. 2 cm x 3 cm (Active)  11/09/22 0700  Location: Heel  Location Orientation: Left  Staging: Deep Tissue Pressure Injury - Purple or maroon localized area of discolored intact skin or blood-filled blister due to damage of underlying soft tissue from pressure and/or shear.  Wound Description (Comments): 2 cm x 3 cm  Present on Admission:   Dressing Type Foam - Lift dressing to assess site every shift 11/30/22 2000     Pressure Injury 11/09/22 Buttocks Right Stage 3 -  Full thickness tissue loss. Subcutaneous fat may be visible but bone, tendon or muscle are NOT exposed. 2 cm x 3 cm x 1 cm w/1.4 cm undermining from 12-3 o'clock (Active)  11/09/22 0700  Location: Buttocks  Location Orientation: Right  Staging: Stage 3 -  Full thickness tissue loss. Subcutaneous fat may be visible but bone, tendon  or muscle are NOT exposed.  Wound Description (Comments): 2 cm x 3 cm x 1 cm w/1.4 cm undermining from 12-3 o'clock  Present on Admission: Yes  Dressing Type Gauze (Comment);ABD 12/01/22 2150     Pressure Injury 11/16/22 Elbow Anterior;Left Wound from arm contracture to elbow crevice, stage 2/ skin tear (Active)  11/16/22 2000  Location: Elbow  Location Orientation: Anterior;Left  Staging:   Wound Description (Comments): Wound from arm contracture to elbow crevice, stage 2/ skin tear  Present on Admission: Yes  Dressing Type None 11/30/22 2000    Consultants:  Palliative care  Procedures:  None  Antimicrobials:  Anti-infectives (From admission, onward)    Start     Dose/Rate Route Frequency Ordered Stop   11/20/22 1400   piperacillin-tazobactam (ZOSYN) IVPB 3.375 g        3.375 g 12.5 mL/hr over 240 Minutes Intravenous Every 8 hours 11/20/22 1251 11/25/22 0904         Subjective: Patient seen and examined.  She has no complaints.  Objective: Vitals:   12/01/22 2135 12/01/22 2225 12/02/22 0555 12/02/22 0741  BP: 128/78  120/68 113/68  Pulse: (!) 114 (!) 105 (!) 107 (!) 101  Resp: 18  17 16   Temp: 98.7 F (37.1 C)  98.8 F (37.1 C) 98.9 F (37.2 C)  TempSrc: Oral   Oral  SpO2: 99% 100% 100% 99%  Weight:        Intake/Output Summary (Last 24 hours) at 12/02/2022 0911 Last data filed at 12/02/2022 0503 Gross per 24 hour  Intake 175 ml  Output 1480 ml  Net -1305 ml    Filed Weights   11/16/22 1911 11/17/22 0420  Weight: 57.1 kg 57.1 kg    Examination:  General exam: Appears calm and comfortable  Respiratory system: Clear to auscultation. Respiratory effort normal. Cardiovascular system: S1 & S2 heard, RRR. No JVD, murmurs, rubs, gallops or clicks. No pedal edema. Gastrointestinal system: Abdomen is nondistended, soft and nontender. No organomegaly or masses felt. Normal bowel sounds heard. Central nervous system: Alert and oriented.  Quadriplegic   Data Reviewed: I have personally reviewed following labs and imaging studies  CBC: No results for input(s): "WBC", "NEUTROABS", "HGB", "HCT", "MCV", "PLT" in the last 168 hours.  Basic Metabolic Panel: Recent Labs  Lab 11/26/22 0731  NA 136  K 4.4  CL 114*  CO2 15*  GLUCOSE 114*  BUN 11  CREATININE 0.67  CALCIUM 8.3*    GFR: Estimated Creatinine Clearance: 55.7 mL/min (by C-G formula based on SCr of 0.67 mg/dL). Liver Function Tests: No results for input(s): "AST", "ALT", "ALKPHOS", "BILITOT", "PROT", "ALBUMIN" in the last 168 hours.  No results for input(s): "LIPASE", "AMYLASE" in the last 168 hours. No results for input(s): "AMMONIA" in the last 168 hours. Coagulation Profile: No results for input(s): "INR", "PROTIME"  in the last 168 hours. Cardiac Enzymes: No results for input(s): "CKTOTAL", "CKMB", "CKMBINDEX", "TROPONINI" in the last 168 hours. BNP (last 3 results) No results for input(s): "PROBNP" in the last 8760 hours. HbA1C: No results for input(s): "HGBA1C" in the last 72 hours.  CBG: Recent Labs  Lab 12/01/22 0834 12/01/22 1149 12/01/22 1641 12/01/22 2157 12/02/22 0737  GLUCAP 133* 73 109* 156* 107*    Lipid Profile: No results for input(s): "CHOL", "HDL", "LDLCALC", "TRIG", "CHOLHDL", "LDLDIRECT" in the last 72 hours. Thyroid Function Tests: No results for input(s): "TSH", "T4TOTAL", "FREET4", "T3FREE", "THYROIDAB" in the last 72 hours. Anemia Panel: No results for input(s): "VITAMINB12", "  FOLATE", "FERRITIN", "TIBC", "IRON", "RETICCTPCT" in the last 72 hours. Sepsis Labs: No results for input(s): "PROCALCITON", "LATICACIDVEN" in the last 168 hours.   Recent Results (from the past 240 hour(s))  Remove and replace urinary cath (placed > 5 days) then obtain urine culture from new indwelling urinary catheter.     Status: Abnormal   Collection Time: 11/22/22  9:23 AM   Specimen: Urine, Catheterized  Result Value Ref Range Status   Specimen Description URINE, CATHETERIZED  Final   Special Requests   Final    NONE Performed at [facility], [address], [place], [place] [address]    Culture (A)  Final    5,000 COLONIES/mL ESCHERICHIA COLI Confirmed Extended Spectrum Beta-Lactamase Producer (ESBL).  In bloodstream infections from ESBL organisms, carbapenems are preferred over piperacillin/tazobactam. They are shown to have a lower risk of mortality.    Report Status 11/26/2022 FINAL  Final   Organism ID, Bacteria ESCHERICHIA COLI (A)  Final      Susceptibility   Escherichia coli - MIC*    AMPICILLIN >=32 RESISTANT Resistant     CEFAZOLIN >=64 RESISTANT Resistant     CEFEPIME >=32 RESISTANT Resistant     CEFTRIAXONE >=64 RESISTANT Resistant     CIPROFLOXACIN >=4  RESISTANT Resistant     GENTAMICIN <=1 SENSITIVE Sensitive     IMIPENEM 0.5 SENSITIVE Sensitive     NITROFURANTOIN <=16 SENSITIVE Sensitive     TRIMETH/SULFA <=20 SENSITIVE Sensitive     AMPICILLIN/SULBACTAM >=32 RESISTANT Resistant     PIP/TAZO >=128 RESISTANT Resistant     * 5,000 COLONIES/mL ESCHERICHIA COLI     Radiology Studies: VAS [place] UPPER EXTREMITY VENOUS DUPLEX  Result Date: 11/30/2022 UPPER VENOUS STUDY  Patient Name:  Jocelyn Sanchez  Date of Exam:   11/30/2022 Medical Rec #: [id number]         Accession #:    [id number] Date of Birth: [birth year]          Patient Gender: F Patient Age:   29 years Exam Location:  [facility] Procedure:      VAS [place] UPPER EXTREMITY VENOUS DUPLEX Referring Phys: Nilay Mangrum --------------------------------------------------------------------------------  Indications: Edema Risk Factors: HX of PE. Anticoagulation: Eliquis. Limitations: Patient immobility (quad. Comparison Study: Previous exam on 08/02/19 was positive for SVT (LUE cephalic                   vein) Performing Technologist: Ernestene Mention RVT, RDMS  Examination Guidelines: A complete evaluation includes B-mode imaging, spectral Doppler, color Doppler, and power Doppler as needed of all accessible portions of each vessel. Bilateral testing is considered an integral part of a complete examination. Limited examinations for reoccurring indications may be performed as noted.  Right Findings: +----------+------------+---------+-----------+----------+--------------+ RIGHT     CompressiblePhasicitySpontaneousProperties   Summary     +----------+------------+---------+-----------+----------+--------------+ IJV           Full       Yes       Yes                             +----------+------------+---------+-----------+----------+--------------+ Subclavian    Full       Yes       Yes                             +----------+------------+---------+-----------+----------+--------------+  Axillary      Full  Yes       Yes                             +----------+------------+---------+-----------+----------+--------------+ Brachial      Full       Yes       Yes                             +----------+------------+---------+-----------+----------+--------------+ Radial        Full                                                 +----------+------------+---------+-----------+----------+--------------+ Ulnar                                               Not visualized +----------+------------+---------+-----------+----------+--------------+ Cephalic      Full                                                 +----------+------------+---------+-----------+----------+--------------+ Basilic       Full       Yes       Yes                             +----------+------------+---------+-----------+----------+--------------+  Left Findings: +----------+------------+---------+-----------+----------+-------+ LEFT      CompressiblePhasicitySpontaneousPropertiesSummary +----------+------------+---------+-----------+----------+-------+ Subclavian    Full       Yes       Yes                      +----------+------------+---------+-----------+----------+-------+  Summary:  Right: No evidence of deep vein thrombosis in the upper extremity. No evidence of superficial vein thrombosis in the upper extremity. However, unable to visualize the ulnar veins. Diffuse subcutaneous edema extending from distal upper arm into hand.  Left: No evidence of thrombosis in the subclavian.  *See table(s) above for measurements and observations.  Diagnosing physician: Sherald Hess MD Electronically signed by Sherald Hess MD on 11/30/2022 at 3:55:11 PM.    Final     Scheduled Meds:  apixaban  5 mg Oral BID   carvedilol  3.125 mg Oral BID WC   insulin aspart  0-5 Units Subcutaneous QHS   insulin aspart  0-9 Units Subcutaneous TID WC   lacosamide  50 mg Oral BID    levETIRAcetam  1,500 mg Oral BID   sacubitril-valsartan  1 tablet Oral BID   Continuous Infusions:     LOS: 16 days   Hughie Closs, MD [facility]  12/02/2022, 9:11 AM   *Please note that this is a verbal dictation therefore any spelling or grammatical errors are due to the "[employer]agon [employer] One" system interpretation.  Please page via Amion and do not message via secure chat for urgent patient care matters. Secure chat can be used for non urgent patient care matters.  How to contact the [facility] Attending or Consulting provider 7A - 7P or covering provider during after hours 7P -7A, for this patient?  Check the care team in [facility] and look for a) attending/consulting [facility] provider listed and b) the [facility] team listed. Page or secure chat 7A-7P. Log into [email or web address] and use [facility] universal password to access. If you do not have the password, please contact the hospital operator. Locate the [facility] provider you are looking for under [facility] and page to a number that you can be directly reached. If you still have difficulty reaching the provider, please page the [facility] (Director on Call) for the Hospitalists listed on amion for assistance.

## 2022-12-02 NOTE — Plan of Care (Signed)

## 2022-12-03 DIAGNOSIS — R569 Unspecified convulsions: Secondary | ICD-10-CM | POA: Diagnosis not present

## 2022-12-03 DIAGNOSIS — Z7189 Other specified counseling: Secondary | ICD-10-CM

## 2022-12-03 LAB — GLUCOSE, CAPILLARY
Glucose-Capillary: 152 mg/dL — ABNORMAL HIGH (ref 70–99)
Glucose-Capillary: 130 mg/dL — ABNORMAL HIGH (ref 70–99)
Glucose-Capillary: 166 mg/dL — ABNORMAL HIGH (ref 70–99)
Glucose-Capillary: 110 mg/dL — ABNORMAL HIGH (ref 70–99)

## 2022-12-03 LAB — BASIC METABOLIC PANEL
Anion gap: 10 (ref 5–15)
BUN: 13 mg/dL (ref 8–23)
CO2: 21 mmol/L — ABNORMAL LOW (ref 22–32)
Calcium: 8.1 mg/dL — ABNORMAL LOW (ref 8.9–10.3)
Chloride: 104 mmol/L (ref 98–111)
Creatinine, Ser: 0.51 mg/dL (ref 0.44–1.00)
GFR, Estimated: >60 mL/min (ref >60)
Glucose, Bld: 160 mg/dL — ABNORMAL HIGH (ref 70–99)
Potassium: 5.2 mmol/L — ABNORMAL HIGH (ref 3.5–5.1)
Sodium: 135 mmol/L (ref 135–145)

## 2022-12-03 LAB — CBC WITH DIFFERENTIAL/PLATELET
Abs Immature Granulocytes: 0.05 10*3/uL (ref 0.00–0.07)
Basophils Absolute: 0.2 10*3/uL — ABNORMAL HIGH (ref 0.0–0.1)
Basophils Relative: 1 %
Eosinophils Absolute: 0.4 10*3/uL (ref 0.0–0.5)
Eosinophils Relative: 3 %
HCT: 36.7 % (ref 36.0–46.0)
Hemoglobin: 11.5 g/dL — ABNORMAL LOW (ref 12.0–15.0)
Immature Granulocytes: 0 %
Lymphocytes Relative: 20 %
Lymphs Abs: 2.7 10*3/uL (ref 0.7–4.0)
MCH: 26.8 pg (ref 26.0–34.0)
MCHC: 31.3 g/dL (ref 30.0–36.0)
MCV: 85.5 fL (ref 80.0–100.0)
Monocytes Absolute: 0.8 10*3/uL (ref 0.1–1.0)
Monocytes Relative: 6 %
Neutro Abs: 9.9 10*3/uL — ABNORMAL HIGH (ref 1.7–7.7)
Neutrophils Relative %: 70 %
Platelets: 473 10*3/uL — ABNORMAL HIGH (ref 150–400)
RBC: 4.29 MIL/uL (ref 3.87–5.11)
RDW: 19.9 % — ABNORMAL HIGH (ref 11.5–15.5)
WBC: 14.1 10*3/uL — ABNORMAL HIGH (ref 4.0–10.5)
nRBC: 0 % (ref 0.0–0.2)

## 2022-12-03 MED ORDER — SODIUM ZIRCONIUM CYCLOSILICATE 10 G PO PACK
10.0000 g | PACK | Freq: Once | ORAL | Status: AC
  Administered 2022-12-03: 10 g via ORAL
  Filled 2022-12-03: qty 1

## 2022-12-03 NOTE — Progress Notes (Signed)
Progress Note   Patient: Jocelyn Sanchez ZOX:[id number] DOB: [birth year] DOA: 11/16/2022     17 DOS: the patient was seen and examined on 12/03/2022   Brief hospital course: 67yo female with h/o MS (quadriplegia, bedbound), chronic sacral ulcer, PE on Eliquis, DM, HTN, and lymphedema who presented on 5/30 with new-onset seizures.  She was initially admitted to ICU for status epilepticus, started on Keppra, Vimpat.  Neurology consulting.  H/o ESBL UTI on 5/22, treated with Zosyn, developed SIRS on 6/3 from presumed UTI and treated with 5 days of Zosyn.  GOC conversation with palliative care on 6/5, DNR, hopeful for SNF for long-term care with palliative care services.  Assessment and Plan:  New onset seizure / Status epilepticus -Patient admitted for seizure; had status epilepticus on presentation, treated in ICU -Currently on oral Keppra, Vimpat and as needed Versed IV.     Acute metabolic encephalopathy -Family states she had baseline normal cognitive function.   -Patient had prolonged seizure leading to prolonged postictal state and altered mental status.  -Family has noticed gradual improvement and is hopeful for meaningful recovery.   -Per notes from previous hospitalist, she was only able to open eyes on commands but mumbles.   -Reportedly back to baseline for the last few days.   Multiple sclerosis with quadriplegia/bedbound status Chronic sacral ulcer with hx of quadriplegia (POA) Pressure Injury 11/09/22 Heel Left Deep Tissue Pressure Injury - Purple or maroon localized area of discolored intact skin or blood-filled blister due to damage of underlying soft tissue from pressure and/or shear. 2 cm x 3 cm (Active)  11/09/22 0700  Location: Heel  Location Orientation: Left  Staging: Deep Tissue Pressure Injury - Purple or maroon localized area of discolored intact skin or blood-filled blister due to damage of underlying soft tissue from pressure and/or shear.  Wound Description  (Comments): 2 cm x 3 cm  Present on Admission:      Pressure Injury 11/09/22 Buttocks Right Stage 3 -  Full thickness tissue loss. Subcutaneous fat may be visible but bone, tendon or muscle are NOT exposed. 2 cm x 3 cm x 1 cm w/1.4 cm undermining from 12-3 o'clock (Active)  11/09/22 0700  Location: Buttocks  Location Orientation: Right  Staging: Stage 3 -  Full thickness tissue loss. Subcutaneous fat may be visible but bone, tendon or muscle are NOT exposed.  Wound Description (Comments): 2 cm x 3 cm x 1 cm w/1.4 cm undermining from 12-3 o'clock  Present on Admission: Yes     Pressure Injury 11/16/22 Elbow Anterior;Left Wound from arm contracture to elbow crevice, stage 2/ skin tear (Active)  11/16/22 2000  Location: Elbow  Location Orientation: Anterior;Left  Staging:   Wound Description (Comments): Wound from arm contracture to elbow crevice, stage 2/ skin tear  Present on Admission: Yes  -Continue supportive care -Local would care  -Pressure alleviating devices    Recurrent ESBL UTI, resolved -Completed 5 days of Zosyn during current hospitalization.   History of PE -Continue Eliquis.   H/o combined systolic and diastolic CHF/ Essential hypertension -Stable -Continue home medications - Coreg and Entresto.     Type 2 diabetes -A1c is 5.2.   -Blood sugar very well-controlled.   -She has not required any insulin per SSI so it was discontinued   Right upper extremity swelling -Doppler ruled out DVT.   -Advised RN to keep right upper extremity elevated.   Goals of care  -Family meeting took place with palliative care on 11/22/2022, they  decided in favor of no artificial feeding or hydration now or in the future, comfort feeds as tolerated.   -Family is hopeful for SNF for long-term care and palliative services.   -They are not interested in hospice at this time. -It appears that she is awaiting placement, [facility] at [facility] -She appears to be medically stable for  transfer when a bed is available      Subjective: Patient is alert and conversant but with speech that is difficult to understand.    I spoke with her son, he thinks she is doing a lot better.  Aunt also thinks she is doing much better.  The plan is for long-term care and he is not certain where.  He recommends further discussion with her sister, Pershing Proud ([phone]) if there are any questions.   Physical Exam: Vitals:   12/02/22 2026 12/03/22 0438 12/03/22 0745 12/03/22 1534  BP: 138/76 128/69 109/61 117/71  Pulse: (!) 105 97 (!) 103 (!) 107  Resp: 18 16 15 16   Temp: 98.8 F (37.1 C) 99.6 F (37.6 C) 98.4 F (36.9 C) 99.3 F (37.4 C)  TempSrc:  Oral Oral Oral  SpO2: 100% 99% 99% 100%  Weight:       General:  Appears frail, atropied muscles of BUE and BLE, frequent but mostly nonsensical speech Eyes:  EOMI, normal lids, iris ENT:  grossly normal hearing, lips & tongue, mmm Neck:  no LAD, masses or thyromegaly Cardiovascular:  RRR, no m/r/g. No LE edema.  Respiratory:   CTA bilaterally with no wheezes/rales/rhonchi.  Normal respiratory effort. Abdomen:  soft, NT, ND Skin:  no rash or induration seen on limited exam Musculoskeletal:  generalized atrophy, no bony abnormality Psychiatric:  alert and awake with speech that is mostly nonsensical Neurologic:  unable to effectively perform   Radiological Exams on Admission: Independently reviewed - see discussion in A/P where applicable  No results found.    Pertinent labs:    None since 6/9    Family Communication: None present; I spoke with her son following today's evaluation  Disposition: Status is: Inpatient Remains inpatient appropriate because: awaiting long-term care transfer  Planned Discharge Destination: Skilled nursing facility    Time spent: 35 minutes  Author: Jonah Blue, MD 12/03/2022 4:03 PM  For on call review [email or web address].

## 2022-12-03 NOTE — Plan of Care (Signed)

## 2022-12-03 NOTE — Hospital Course (Signed)
67yo female with h/o MS (quadriplegia, bedbound), chronic sacral ulcer, PE on Eliquis, DM, HTN, and lymphedema who presented on 5/30 with new-onset seizures.  She was initially admitted to ICU for status epilepticus, started on Keppra, Vimpat.  Neurology consulting.  H/o ESBL UTI on 5/22, treated with Zosyn, developed SIRS on 6/3 from presumed UTI and treated with 5 days of Zosyn.  GOC conversation with palliative care on 6/5, DNR, hopeful for SNF for long-term care with palliative care services.

## 2022-12-04 DIAGNOSIS — R569 Unspecified convulsions: Secondary | ICD-10-CM | POA: Diagnosis not present

## 2022-12-04 DIAGNOSIS — Z515 Encounter for palliative care: Secondary | ICD-10-CM

## 2022-12-04 LAB — BASIC METABOLIC PANEL
Anion gap: 9 (ref 5–15)
BUN: 13 mg/dL (ref 8–23)
CO2: 23 mmol/L (ref 22–32)
Calcium: 8.1 mg/dL — ABNORMAL LOW (ref 8.9–10.3)
Chloride: 101 mmol/L (ref 98–111)
Creatinine, Ser: 0.55 mg/dL (ref 0.44–1.00)
GFR, Estimated: >60 mL/min (ref >60)
Glucose, Bld: 132 mg/dL — ABNORMAL HIGH (ref 70–99)
Potassium: 3.7 mmol/L (ref 3.5–5.1)
Sodium: 133 mmol/L — ABNORMAL LOW (ref 135–145)

## 2022-12-04 LAB — GLUCOSE, CAPILLARY
Glucose-Capillary: 135 mg/dL — ABNORMAL HIGH (ref 70–99)
Glucose-Capillary: 140 mg/dL — ABNORMAL HIGH (ref 70–99)

## 2022-12-04 MED ORDER — SODIUM CHLORIDE 0.9 % IV SOLN: 50.0000 mg | Freq: Two times a day (BID) | INTRAVENOUS | Status: AC

## 2022-12-04 MED ORDER — BIOTENE DRY MOUTH MT LIQD: 15.0000 mL | OROMUCOSAL | Status: DC | PRN

## 2022-12-04 MED ORDER — LEVETIRACETAM IN NACL 1500 MG/100ML IV SOLN
1500.0000 mg | Freq: Two times a day (BID) | INTRAVENOUS | Status: DC
  Administered 2022-12-04: 1500 mg via INTRAVENOUS
  Filled 2022-12-04 (×2): qty 100

## 2022-12-04 MED ORDER — DIPHENHYDRAMINE HCL 50 MG/ML IJ SOLN: 12.5000 mg | INTRAMUSCULAR | Status: DC | PRN

## 2022-12-04 MED ORDER — ONDANSETRON 4 MG PO TBDP: 4.0000 mg | ORAL_TABLET | Freq: Four times a day (QID) | ORAL | Status: DC | PRN

## 2022-12-04 MED ORDER — GLYCOPYRROLATE 1 MG PO TABS: 1.0000 mg | ORAL_TABLET | ORAL | Status: DC | PRN

## 2022-12-04 MED ORDER — HALOPERIDOL LACTATE 5 MG/ML IJ SOLN: 0.5000 mg | INTRAMUSCULAR | Status: DC | PRN

## 2022-12-04 MED ORDER — MORPHINE 100MG IN NS 100ML (1MG/ML) PREMIX INFUSION
5.0000 mg/h | INTRAVENOUS | Status: DC
  Administered 2022-12-04: 5 mg/h via INTRAVENOUS
  Filled 2022-12-04: qty 100

## 2022-12-04 MED ORDER — HALOPERIDOL LACTATE 2 MG/ML PO CONC: 0.5000 mg | ORAL | 0 refills | Status: AC | PRN

## 2022-12-04 MED ORDER — GLYCOPYRROLATE 0.2 MG/ML IJ SOLN: 0.2000 mg | INTRAMUSCULAR | Status: DC | PRN

## 2022-12-04 MED ORDER — HALOPERIDOL LACTATE 5 MG/ML IJ SOLN: 0.5000 mg | INTRAMUSCULAR | Status: AC | PRN

## 2022-12-04 MED ORDER — LORAZEPAM 1 MG PO TABS: 1.0000 mg | ORAL_TABLET | ORAL | 0 refills | Status: AC | PRN

## 2022-12-04 MED ORDER — LORAZEPAM 1 MG PO TABS: 1.0000 mg | ORAL_TABLET | ORAL | Status: DC | PRN

## 2022-12-04 MED ORDER — LEVETIRACETAM IN NACL 1500 MG/100ML IV SOLN: 1500.0000 mg | Freq: Two times a day (BID) | INTRAVENOUS | Status: AC

## 2022-12-04 MED ORDER — HALOPERIDOL 0.5 MG PO TABS: 0.5000 mg | ORAL_TABLET | ORAL | Status: AC | PRN

## 2022-12-04 MED ORDER — HALOPERIDOL 1 MG PO TABS: 0.5000 mg | ORAL_TABLET | ORAL | Status: DC | PRN

## 2022-12-04 MED ORDER — MORPHINE BOLUS VIA INFUSION: 2.0000 mg | INTRAVENOUS | 0 refills | Status: AC | PRN

## 2022-12-04 MED ORDER — HALOPERIDOL LACTATE 2 MG/ML PO CONC: 0.5000 mg | ORAL | Status: DC | PRN

## 2022-12-04 MED ORDER — ONDANSETRON HCL 4 MG/2ML IJ SOLN: 4.0000 mg | Freq: Four times a day (QID) | INTRAMUSCULAR | Status: DC | PRN

## 2022-12-04 MED ORDER — LORAZEPAM 2 MG/ML IJ SOLN: 1.0000 mg | INTRAMUSCULAR | Status: DC | PRN

## 2022-12-04 MED ORDER — MORPHINE BOLUS VIA INFUSION: 2.0000 mg | INTRAVENOUS | Status: DC | PRN

## 2022-12-04 MED ORDER — MORPHINE 100MG IN NS 100ML (1MG/ML) PREMIX INFUSION: 5.0000 mg/h | INTRAVENOUS | 0 refills | Status: AC

## 2022-12-04 MED ORDER — POLYVINYL ALCOHOL 1.4 % OP SOLN: 1.0000 [drp] | Freq: Four times a day (QID) | OPHTHALMIC | Status: DC | PRN

## 2022-12-04 MED ORDER — SODIUM CHLORIDE 0.9 % IV SOLN
50.0000 mg | Freq: Two times a day (BID) | INTRAVENOUS | Status: DC
  Administered 2022-12-04: 50 mg via INTRAVENOUS
  Filled 2022-12-04 (×2): qty 5

## 2022-12-04 MED ORDER — LORAZEPAM 2 MG/ML PO CONC: 1.0000 mg | ORAL | Status: DC | PRN

## 2022-12-04 NOTE — Progress Notes (Signed)
Progress Note   Patient: Jocelyn Sanchez VWU:[id number] DOB: July 12, 1955 DOA: 11/16/2022     18 DOS: the patient was seen and examined on 12/04/2022   Brief hospital course: 67yo female with h/o MS (quadriplegia, bedbound), chronic sacral ulcer, PE on Eliquis, DM, HTN, and lymphedema who presented on 5/30 with new-onset seizures.  She was initially admitted to ICU for status epilepticus, started on Keppra, Vimpat.  Neurology consulting.  H/o ESBL UTI on 5/22, treated with Zosyn, developed SIRS on 6/3 from presumed UTI and treated with 5 days of Zosyn.  GOC conversation with palliative care on 6/5, DNR, hopeful for SNF for long-term care with palliative care services.  Assessment and Plan:  New onset seizure / Status epilepticus -Patient admitted for seizure; had status epilepticus on presentation, treated in ICU -Currently on oral Keppra, Vimpat and as needed Versed IV -There is concern for possible recurrent seizures but we are now transitioning to comfort only. -She has been transitioned to IV Keppra and Vimpat for comfort reasons.  Acute metabolic encephalopathy -Family states she had baseline normal cognitive function.   -Patient had prolonged seizure leading to prolonged postictal state and altered mental status.  -Family noticed gradual improvement and was hopeful for meaningful recovery.   -Patient discussed with her son yesterday, who reported that she was doing well and back to baseline. -However, in discussion with sister and brother today, this is clearly not the case.   -They have watched her continue to deteriorate and agree that this appears to be a terminal condition at this time. -Her mental status is quite poor and she is taking almost no PO intake. -As such, they want to ensure that she is receiving comfort measures only and want morphine infusion to ensure that she is not suffering.   -They are in agreement with transfer to [facility] for residential hospice rather than to  discharge to SNF.   Multiple sclerosis with quadriplegia/bedbound status Chronic sacral ulcer with hx of quadriplegia (POA) Pressure Injury 11/09/22 Heel Left Deep Tissue Pressure Injury - Purple or maroon localized area of discolored intact skin or blood-filled blister due to damage of underlying soft tissue from pressure and/or shear. 2 cm x 3 cm (Active)  11/09/22 0700  Location: Heel  Location Orientation: Left  Staging: Deep Tissue Pressure Injury - Purple or maroon localized area of discolored intact skin or blood-filled blister due to damage of underlying soft tissue from pressure and/or shear.  Wound Description (Comments): 2 cm x 3 cm  Present on Admission:      Pressure Injury 11/09/22 Buttocks Right Stage 3 -  Full thickness tissue loss. Subcutaneous fat may be visible but bone, tendon or muscle are NOT exposed. 2 cm x 3 cm x 1 cm w/1.4 cm undermining from 12-3 o'clock (Active)  11/09/22 0700  Location: Buttocks  Location Orientation: Right  Staging: Stage 3 -  Full thickness tissue loss. Subcutaneous fat may be visible but bone, tendon or muscle are NOT exposed.  Wound Description (Comments): 2 cm x 3 cm x 1 cm w/1.4 cm undermining from 12-3 o'clock  Present on Admission: Yes     Pressure Injury 11/16/22 Elbow Anterior;Left Wound from arm contracture to elbow crevice, stage 2/ skin tear (Active)  11/16/22 2000  Location: Elbow  Location Orientation: Anterior;Left  Staging:   Wound Description (Comments): Wound from arm contracture to elbow crevice, stage 2/ skin tear  Present on Admission: Yes  -Continue supportive care -Local would care  -Pressure alleviating  devices    Recurrent ESBL UTI, resolved -Completed 5 days of Zosyn during current hospitalization.   History of PE -Stopping Eliquis with transition to comfort measures.   H/o combined systolic and diastolic CHF/ Essential hypertension -Stopping Coreg and Entresto with transition to hospice.   Type 2  diabetes -A1c is 5.2.   -Blood sugar very well-controlled.   -She has not required any insulin per SSI so it was discontinued   Right upper extremity swelling -Doppler ruled out DVT.   -Advised RN to keep right upper extremity elevated.   Goals of care  -Family meeting took place with palliative care on 11/22/2022, they decided in favor of no artificial feeding or hydration now or in the future, comfort feeds as tolerated.   -They are now open to residential hospice at this time. -She appears to be medically appropriate for transfer when a bed is available      Subjective: Patient is more somnolent today, has not taken much if any PO input, nonsensical speech.  Physical Exam: Vitals:   12/03/22 1534 12/03/22 2053 12/04/22 0540 12/04/22 0813  BP: 117/71 139/77 (!) 103/59 108/70  Pulse: (!) 107 (!) 103 (!) 103 (!) 109  Resp: 16 18 16 16   Temp: 99.3 F (37.4 C) 97.7 F (36.5 C) 99.8 F (37.7 C) 98.6 F (37 C)  TempSrc: Oral  Oral Oral  SpO2: 100%  100% 100%  Weight:       General:  Appears frail, atropied muscles of BUE and BLE, sparse nonsensical speech Eyes:  normal lids, iris ENT:  grossly normal lips & tongue, mmm Neck:  no LAD, masses or thyromegaly Cardiovascular:  RR with mild tachycardia, no m/r/g. No LE edema.  Respiratory:   CTA bilaterally with no wheezes/rales/rhonchi.  Normal respiratory effort. Abdomen:  soft, NT, ND Skin:  no rash or induration seen on limited exam Musculoskeletal:  generalized atrophy, no bony abnormality Psychiatric:  more somnolent today with speech that is mostly nonsensical Neurologic:  unable to effectively perform   Pertinent labs:    Na++ 133 Glucose 132   Family Communication: Sister and brother were present, long GOC discussion held at bedside  Disposition: Status is: Inpatient Remains inpatient appropriate because: transitioning to comfort care  Planned Discharge Destination:  inpatient hospice    Time spent: 50  minutes  Author: Jonah Blue, MD 12/04/2022 1:28 PM  For on call review [email or web address].

## 2022-12-04 NOTE — Progress Notes (Addendum)
[facility] ([facility]) Hospital Liaison Note   Referral received for patient/family interest in [facility]. Chart under review by [facility] physician.    Bed offered and accepted for transfer today. Unit RN please call report to [phone] prior to patient leaving the unit. Please send signed DNR and paperwork with patient.    Please leave all IV access and ports in place.    Please call with any questions or concerns. Thank you   Shanita Wicker, LCSW [facility] Hospital Liaison [phone] 

## 2022-12-04 NOTE — Discharge Summary (Signed)
Physician Discharge Summary   Patient: Jocelyn Sanchez MRN: [id number] DOB: [birth year]  Admit date:     11/16/2022  Discharge date: 12/04/22  Discharge Physician: Jonah Blue   PCP: Bethanie Dicker, FNP   Recommendations at discharge:    Discharge to residential hospice  Discharge Diagnoses: Principal Problem:   Seizure [facility]) Active Problems:   Diabetes mellitus with peripheral vascular disease (HCC)   Multiple sclerosis (HCC)   Urinary tract infection with hematuria   Chronic combined systolic (congestive) and diastolic (congestive) heart failure (HCC)   Sacral wound   Acute metabolic encephalopathy   Goals of care, counseling/discussion   Hospice care patient     Hospital Course: 67yo female with h/o MS (quadriplegia, bedbound), chronic sacral ulcer, PE on Eliquis, DM, HTN, and lymphedema who presented on 5/30 with new-onset seizures.  She was initially admitted to ICU for status epilepticus, started on Keppra, Vimpat.  Neurology consulting.  H/o ESBL UTI on 5/22, treated with Zosyn, developed SIRS on 6/3 from presumed UTI and treated with 5 days of Zosyn.  GOC conversation with palliative care on 6/5, DNR, hopeful for SNF for long-term care with palliative care services.  Patient has continued to decline and family discussion on 6/17 led to agreement with comfort care measures, morphine drip, and transfer to [facility] for residential hospice.  Assessment and Plan:   New onset seizure / Status epilepticus -Patient admitted for seizure; had status epilepticus on presentation, treated in ICU -Currently on oral Keppra, Vimpat and as needed Versed IV -There is concern for possible recurrent seizures but we are now transitioning to comfort only. -She has been transitioned to IV Keppra and Vimpat for comfort reasons.   Acute metabolic encephalopathy -Family states she had baseline normal cognitive function.   -Patient had prolonged seizure leading to prolonged postictal  state and altered mental status.  -Family noticed gradual improvement and was hopeful for meaningful recovery.   -Patient discussed with her son yesterday, who reported that she was doing well and back to baseline. -However, in discussion with sister and brother today, this is clearly not the case.   -They have watched her continue to deteriorate and agree that this appears to be a terminal condition at this time. -Her mental status is quite poor and she is taking almost no PO intake. -As such, they want to ensure that she is receiving comfort measures only and want morphine infusion to ensure that she is not suffering.   -They are in agreement with transfer to [facility] for residential hospice rather than to discharge to SNF.   Multiple sclerosis with quadriplegia/bedbound status Chronic sacral ulcer with hx of quadriplegia (POA) Pressure Injury 11/09/22 Heel Left Deep Tissue Pressure Injury - Purple or maroon localized area of discolored intact skin or blood-filled blister due to damage of underlying soft tissue from pressure and/or shear. 2 cm x 3 cm (Active)  11/09/22 0700  Location: Heel  Location Orientation: Left  Staging: Deep Tissue Pressure Injury - Purple or maroon localized area of discolored intact skin or blood-filled blister due to damage of underlying soft tissue from pressure and/or shear.  Wound Description (Comments): 2 cm x 3 cm  Present on Admission:      Pressure Injury 11/09/22 Buttocks Right Stage 3 -  Full thickness tissue loss. Subcutaneous fat may be visible but bone, tendon or muscle are NOT exposed. 2 cm x 3 cm x 1 cm w/1.4 cm undermining from 12-3 o'clock (Active)  11/09/22 0700  Location: Buttocks  Location Orientation: Right  Staging: Stage 3 -  Full thickness tissue loss. Subcutaneous fat may be visible but bone, tendon or muscle are NOT exposed.  Wound Description (Comments): 2 cm x 3 cm x 1 cm w/1.4 cm undermining from 12-3 o'clock  Present on Admission:  Yes     Pressure Injury 11/16/22 Elbow Anterior;Left Wound from arm contracture to elbow crevice, stage 2/ skin tear (Active)  11/16/22 2000  Location: Elbow  Location Orientation: Anterior;Left  Staging:   Wound Description (Comments): Wound from arm contracture to elbow crevice, stage 2/ skin tear  Present on Admission: Yes  -Continue supportive care -Local would care  -Pressure alleviating devices    Recurrent ESBL UTI, resolved -Completed 5 days of Zosyn during current hospitalization.   History of PE -Stopping Eliquis with transition to comfort measures.   H/o combined systolic and diastolic CHF/ Essential hypertension -Stopping Coreg and Entresto with transition to hospice.   Type 2 diabetes -A1c is 5.2.   -Blood sugar very well-controlled.   -She has not required any insulin per SSI so it was discontinued   Right upper extremity swelling -Doppler ruled out DVT.   -Advised RN to keep right upper extremity elevated.   Goals of care  -Family meeting took place with palliative care on 11/22/2022, they decided in favor of no artificial feeding or hydration now or in the future, comfort feeds as tolerated.   -They are now open to residential hospice at this time. -She appears to be medically appropriate for transfer when a bed is available     Consultants: Neurology; pulmonology; nutrition; PT/OT; SLP; palliative care; TOC team Procedures performed:  LTM EEG, DVT US  Disposition: Hospice care Diet recommendation:  Dysphagia 1 if able to tolerate DISCHARGE MEDICATION: Allergies as of 12/04/2022       Reactions   Sulfa Antibiotics Anaphylaxis, Shortness Of Breath, Swelling   Chlorhexidine Other (See Comments)   Reaction not recalled        Medication List     STOP taking these medications    carvedilol 6.25 MG tablet Commonly known as: COREG   CLEAR EYES MAXIMUM ITCHY EYE OP   [employer] D3 50 MCG (2000 UT) Caps Generic drug: Cholecalciferol   Eliquis 5 MG Tabs  tablet Generic drug: apixaban   Entresto 24-26 MG Generic drug: sacubitril-valsartan   fluticasone 50 MCG/ACT nasal spray Commonly known as: FLONASE   ketoconazole 2 % cream Commonly known as: NIZORAL   levocetirizine 5 MG tablet Commonly known as: XYZAL   liver oil-zinc oxide 40 % ointment Commonly known as: DESITIN   methocarbamol 500 MG tablet Commonly known as: ROBAXIN   multivitamin with minerals Tabs tablet   omeprazole 20 MG capsule Commonly known as: PRILOSEC   polyethylene glycol 17 g packet Commonly known as: MIRALAX / GLYCOLAX   senna-docusate 8.6-50 MG tablet Commonly known as: Senokot-S   vitamin C 1000 MG tablet   Zinc Sulfate 220 (50 Zn) MG Tabs       TAKE these medications    acetaminophen 500 MG tablet Commonly known as: TYLENOL Take 500-1,000 mg by mouth See admin instructions. Take 1,000 mg by mouth at bedtime and an additional 500-1,000 mg once a day as needed for pain or headaches   haloperidol 0.5 MG tablet Commonly known as: HALDOL Take 1 tablet (0.5 mg total) by mouth every 4 (four) hours as needed for agitation (or delirium).   haloperidol 2 MG/ML solution Commonly known as: HALDOL  Place 0.3 mLs (0.6 mg total) under the tongue every 4 (four) hours as needed for agitation (or delirium).   haloperidol lactate 5 MG/ML injection Commonly known as: HALDOL Inject 0.1 mLs (0.5 mg total) into the vein every 4 (four) hours as needed (or delirium).   lacosamide 50 mg in sodium chloride 0.9 % 25 mL Inject 50 mg into the vein every 12 (twelve) hours.   levETIRacetam 1500 MG/100ML Soln Commonly known as: KEPPRA Inject 100 mLs (1,500 mg total) into the vein every 12 (twelve) hours.   LORazepam 1 MG tablet Commonly known as: ATIVAN Take 1 tablet (1 mg total) by mouth every 4 (four) hours as needed for anxiety.   morphine 1 mg/mL Soln infusion Inject 5 mg/hr into the vein continuous.   morphine 5 mg/mL Soln Inject 2 mg into the vein  every 15 (fifteen) minutes as needed (uncontrolled pain, distress or if respiratory rate is greater than 25).   traZODone 100 MG tablet Commonly known as: DESYREL Take 100 mg by mouth at bedtime.        Follow-up Information     [facility] Follow up.   Specialty: Home Health Services Why: Rolene Arbour will continue to provide home health services.  They will call you in the next 24-48 hours to set up services when you return home. Contact information: [address] [place] [address] [phone]         Issifu, Georgann Housekeeper, FNP Follow up in 1 month(s).   Specialty: Family Medicine Contact information: [address] [place] [address] [phone]                Discharge Exam: Ceasar Mons Weights   11/16/22 1911 11/17/22 0420  Weight: 57.1 kg 57.1 kg    Subjective: Patient is more somnolent today, has not taken much if any PO input, nonsensical speech.   Physical Exam:       Vitals:    12/03/22 1534 12/03/22 2053 12/04/22 0540 12/04/22 0813  BP: 117/71 139/77 (!) 103/59 108/70  Pulse: (!) 107 (!) 103 (!) 103 (!) 109  Resp: 16 18 16 16   Temp: 99.3 F (37.4 C) 97.7 F (36.5 C) 99.8 F (37.7 C) 98.6 F (37 C)  TempSrc: Oral   Oral Oral  SpO2: 100%   100% 100%  Weight:            General:  Appears frail, atropied muscles of BUE and BLE, sparse nonsensical speech Eyes:  normal lids, iris ENT:  grossly normal lips & tongue, mmm Neck:  no LAD, masses or thyromegaly Cardiovascular:  RR with mild tachycardia, no m/r/g. No LE edema.  Respiratory:   CTA bilaterally with no wheezes/rales/rhonchi.  Normal respiratory effort. Abdomen:  soft, NT, ND Skin:  no rash or induration seen on limited exam Musculoskeletal:  generalized atrophy, no bony abnormality Psychiatric:  more somnolent today with speech that is mostly nonsensical Neurologic:  unable to effectively perform     Pertinent labs:     Na++ 133 Glucose  132  Condition at discharge:  terminal  The results of significant diagnostics from this hospitalization (including imaging, microbiology, ancillary and laboratory) are listed below for reference.   Imaging Studies: VAS [place] UPPER EXTREMITY VENOUS DUPLEX  Result Date: 11/30/2022 UPPER VENOUS STUDY  Patient Name:  MERIEL LAMBING Coykendall  Date of Exam:   11/30/2022 Medical Rec #: [id number]         Accession #:  [id number] Date of Birth: March 29, 1956          Patient Gender: F Patient Age:   5 years Exam Location:  [facility] Procedure:      VAS [place] UPPER EXTREMITY VENOUS DUPLEX Referring Phys: RAVI PAHWANI --------------------------------------------------------------------------------  Indications: Edema Risk Factors: HX of PE. Anticoagulation: Eliquis. Limitations: Patient immobility (quad. Comparison Study: Previous exam on 08/02/19 was positive for SVT (LUE cephalic                   vein) Performing Technologist: Ernestene Mention RVT, RDMS  Examination Guidelines: A complete evaluation includes B-mode imaging, spectral Doppler, color Doppler, and power Doppler as needed of all accessible portions of each vessel. Bilateral testing is considered an integral part of a complete examination. Limited examinations for reoccurring indications may be performed as noted.  Right Findings: +----------+------------+---------+-----------+----------+--------------+ RIGHT     CompressiblePhasicitySpontaneousProperties   Summary     +----------+------------+---------+-----------+----------+--------------+ IJV           Full       Yes       Yes                             +----------+------------+---------+-----------+----------+--------------+ Subclavian    Full       Yes       Yes                             +----------+------------+---------+-----------+----------+--------------+ Axillary      Full       Yes       Yes                              +----------+------------+---------+-----------+----------+--------------+ Brachial      Full       Yes       Yes                             +----------+------------+---------+-----------+----------+--------------+ Radial        Full                                                 +----------+------------+---------+-----------+----------+--------------+ Ulnar                                               Not visualized +----------+------------+---------+-----------+----------+--------------+ Cephalic      Full                                                 +----------+------------+---------+-----------+----------+--------------+ Basilic       Full       Yes       Yes                             +----------+------------+---------+-----------+----------+--------------+  Left Findings: +----------+------------+---------+-----------+----------+-------+ LEFT      CompressiblePhasicitySpontaneousPropertiesSummary +----------+------------+---------+-----------+----------+-------+ Subclavian    Full  Yes       Yes                      +----------+------------+---------+-----------+----------+-------+  Summary:  Right: No evidence of deep vein thrombosis in the upper extremity. No evidence of superficial vein thrombosis in the upper extremity. However, unable to visualize the ulnar veins. Diffuse subcutaneous edema extending from distal upper arm into hand.  Left: No evidence of thrombosis in the subclavian.  *See table(s) above for measurements and observations.  Diagnosing physician: Sherald Hess MD Electronically signed by Sherald Hess MD on 11/30/2022 at 3:55:11 PM.    Final    Overnight EEG with video  Result Date: 11/17/2022 Charlsie Quest, MD     11/17/2022  4:33 PM Patient Name: IRISSA FOGLIA MRN: [id number] Epilepsy Attending: Charlsie Quest Referring Physician/Provider: Lynnae January, NP Duration: 11/17/2022 0022 to 1103 Patient history: 67  yo F patient with a history of multiple sclerosis, quadriplegia, bedbound chronic sacral ulcers, hypertension, and lymphedema, on anticoagulation for pulmonary embolism presented to the ED with reports of seizure-like activity according to family lasted about 30 minutes prior to EMS arrival. EEG to evaluate for seizure. Level of alertness:  lethargic AEDs during EEG study: LEV Technical aspects: This EEG study was done with scalp electrodes positioned according to the 10-20 International system of electrode placement. Electrical activity was reviewed with band pass filter of 1-70Hz , sensitivity of 7 uV/mm, display speed of 56mm/sec with a 60Hz  notched filter applied as appropriate. EEG data were recorded continuously and digitally stored.  Video monitoring was available and reviewed as appropriate. Description: EEG showed continuous generalized and lateralized left hemisphere low amplitude 2 to 3 Hz delta slowing.  Lateralized paretic discharges were noted in left hemisphere, maximal left posterior quadrant with fluctuating frequency of 0.25 to 2.5 Hz.  Seizures without clinical signs were also noted.  EEG showed 2 to 2.5 Hz sharp waves in left hemisphere, maximal left posterior quadrant admixed with 5 to 7 Hz theta slowing which then evolved into 2 to 3 Hz delta slowing and appeared more sharply contoured.  Average 7 seizures were noted per hour, lasting about 30 seconds to 1 minute. Hyperventilation and photic stimulation were not performed.   Of note, study was technically difficult due to significant myogenic artifact. ABNORMALITY - Seizure without clinical signs, left hemisphere, maximal left posterior quadrant - Lateralized periodic discharges, left hemisphere, maximal left posterior quadrant - Continuous slow, generalized and lateralized left hemisphere IMPRESSION: This technically difficult study showed seizures without clinical signs arising from left hemisphere, maximal left posterior quadrant, average 7  seizures/ hour, lasting 30 seconds to 1 minute each.  Additionally there was evidence of epileptogenicity and cortical dysfunction arising from left hemisphere likely secondary to underlying structural abnormality, seizures.  Lastly there was severe diffuse encephalopathy. Charlsie Quest   MR BRAIN W WO CONTRAST  Result Date: 11/16/2022 CLINICAL DATA:  Mental status change, unknown cause; Neuro deficit, acute, stroke suspected EXAM: MRI HEAD WITHOUT AND WITH CONTRAST MRA HEAD WITHOUT CONTRAST MRA NECK WITHOUT AND WITH CONTRAST TECHNIQUE: Multiplanar, multi-echo pulse sequences of the brain and surrounding structures were acquired without and with intravenous contrast. Angiographic images of the Circle of Willis were acquired using MRA technique without intravenous contrast.Angiographic images of the neck were acquired using MRA technique without and with intravenous contrast. Carotid stenosis measurements (when applicable) are obtained utilizing NASCET criteria, using the distal internal carotid diameter as the denominator. CONTRAST:  7mL  GADAVIST GADOBUTROL 1 MMOL/ML IV SOLN COMPARISON:  None Available. FINDINGS: MRI HEAD FINDINGS Brain: Regions of diffusion restriction in the left thalamus in the anterior left temporal lobe are favored to represent postictal changes. There is no evidence of acute infarct. No contrast-enhancing lesion to suggest intracranial metastatic disease. There are multiple periventricular, pericallosal, and subcortical T2/FLAIR hyperintense lesions that are favored to represent demyelinating lesions. None of the lesions definitively demonstrate diffusion restriction or contrast enhancement to suggest active demyelination. No hemorrhage.  No extra-axial fluid collection.  No hydrocephalus. Vascular: Normal flow voids. Skull and upper cervical spine: Normal marrow signal. Sinuses/Orbits: No middle ear or mastoid effusion. Paranasal sinuses are notable for mild mucosal thickening in  bilateral ethmoid sinuses. Orbits are unremarkable. Other: None. MRA HEAD FINDINGS Anterior circulation: There is moderate severe narrowing in the cavernous segments bilateral ICAs. Bilateral supraclinoid ICAs and MCAs are normal in appearance. Posterior circulation: No aneurysm. No stenosis. No proximal occlusion. Anatomic variants: None MRA NECK FINDINGS Aortic arch: Standard three-vessel arch. No evidence of dissection. No aneurysm. Right carotid system: No significant stenosis. No occlusion. No dissection. Left carotid system: The origin of the left common carotid artery is incompletely assessed due to motion artifact. Moderate narrowing of the origin of the left ICA. No evidence of occlusion. No dissection. No stenosis. Vertebral arteries: The origin of the right vertebral artery is incompletely assessed due to motion artifact. On the MIP images, there is absent signal of the origin of the right vertebral artery. The right vertebral artery also demonstrates narrowing in the V4 segment. Other: None. Vascular: As above. Skull and upper cervical spine: Normal marrow signal. IMPRESSION: 1. Regions of diffusion restriction in the left thalamus and anterior left temporal lobe are favored to represent postictal changes. 2. Multiple periventricular, pericallosal, and subcortical T2/FLAIR hyperintense lesions that are favored to represent demyelinating lesions. These lesions do not definitively demonstrate diffusion restriction or contrast enhancement to suggest active demyelination. 3. Moderate to severe narrowing in the cavernous segments of bilateral ICAs. 4. There is absent signal at the origin of the right vertebral artery, which may be due to motion artifact or severe stenosis. Recommend further evaluation with a CTA head/neck if more definitive characterization is clinically warranted. 5. Moderate narrowing of the origin of the left ICA. Electronically Signed   By: Lorenza Cambridge M.D.   On: 11/16/2022 18:46   MR  ANGIO HEAD WO CONTRAST  Result Date: 11/16/2022 CLINICAL DATA:  Mental status change, unknown cause; Neuro deficit, acute, stroke suspected EXAM: MRI HEAD WITHOUT AND WITH CONTRAST MRA HEAD WITHOUT CONTRAST MRA NECK WITHOUT AND WITH CONTRAST TECHNIQUE: Multiplanar, multi-echo pulse sequences of the brain and surrounding structures were acquired without and with intravenous contrast. Angiographic images of the Circle of Willis were acquired using MRA technique without intravenous contrast.Angiographic images of the neck were acquired using MRA technique without and with intravenous contrast. Carotid stenosis measurements (when applicable) are obtained utilizing NASCET criteria, using the distal internal carotid diameter as the denominator. CONTRAST:  7mL GADAVIST GADOBUTROL 1 MMOL/ML IV SOLN COMPARISON:  None Available. FINDINGS: MRI HEAD FINDINGS Brain: Regions of diffusion restriction in the left thalamus in the anterior left temporal lobe are favored to represent postictal changes. There is no evidence of acute infarct. No contrast-enhancing lesion to suggest intracranial metastatic disease. There are multiple periventricular, pericallosal, and subcortical T2/FLAIR hyperintense lesions that are favored to represent demyelinating lesions. None of the lesions definitively demonstrate diffusion restriction or contrast enhancement to suggest active demyelination. No  hemorrhage.  No extra-axial fluid collection.  No hydrocephalus. Vascular: Normal flow voids. Skull and upper cervical spine: Normal marrow signal. Sinuses/Orbits: No middle ear or mastoid effusion. Paranasal sinuses are notable for mild mucosal thickening in bilateral ethmoid sinuses. Orbits are unremarkable. Other: None. MRA HEAD FINDINGS Anterior circulation: There is moderate severe narrowing in the cavernous segments bilateral ICAs. Bilateral supraclinoid ICAs and MCAs are normal in appearance. Posterior circulation: No aneurysm. No stenosis. No  proximal occlusion. Anatomic variants: None MRA NECK FINDINGS Aortic arch: Standard three-vessel arch. No evidence of dissection. No aneurysm. Right carotid system: No significant stenosis. No occlusion. No dissection. Left carotid system: The origin of the left common carotid artery is incompletely assessed due to motion artifact. Moderate narrowing of the origin of the left ICA. No evidence of occlusion. No dissection. No stenosis. Vertebral arteries: The origin of the right vertebral artery is incompletely assessed due to motion artifact. On the MIP images, there is absent signal of the origin of the right vertebral artery. The right vertebral artery also demonstrates narrowing in the V4 segment. Other: None. Vascular: As above. Skull and upper cervical spine: Normal marrow signal. IMPRESSION: 1. Regions of diffusion restriction in the left thalamus and anterior left temporal lobe are favored to represent postictal changes. 2. Multiple periventricular, pericallosal, and subcortical T2/FLAIR hyperintense lesions that are favored to represent demyelinating lesions. These lesions do not definitively demonstrate diffusion restriction or contrast enhancement to suggest active demyelination. 3. Moderate to severe narrowing in the cavernous segments of bilateral ICAs. 4. There is absent signal at the origin of the right vertebral artery, which may be due to motion artifact or severe stenosis. Recommend further evaluation with a CTA head/neck if more definitive characterization is clinically warranted. 5. Moderate narrowing of the origin of the left ICA. Electronically Signed   By: Lorenza Cambridge M.D.   On: 11/16/2022 18:46   MR ANGIO NECK W WO CONTRAST  Result Date: 11/16/2022 CLINICAL DATA:  Mental status change, unknown cause; Neuro deficit, acute, stroke suspected EXAM: MRI HEAD WITHOUT AND WITH CONTRAST MRA HEAD WITHOUT CONTRAST MRA NECK WITHOUT AND WITH CONTRAST TECHNIQUE: Multiplanar, multi-echo pulse sequences  of the brain and surrounding structures were acquired without and with intravenous contrast. Angiographic images of the Circle of Willis were acquired using MRA technique without intravenous contrast.Angiographic images of the neck were acquired using MRA technique without and with intravenous contrast. Carotid stenosis measurements (when applicable) are obtained utilizing NASCET criteria, using the distal internal carotid diameter as the denominator. CONTRAST:  7mL GADAVIST GADOBUTROL 1 MMOL/ML IV SOLN COMPARISON:  None Available. FINDINGS: MRI HEAD FINDINGS Brain: Regions of diffusion restriction in the left thalamus in the anterior left temporal lobe are favored to represent postictal changes. There is no evidence of acute infarct. No contrast-enhancing lesion to suggest intracranial metastatic disease. There are multiple periventricular, pericallosal, and subcortical T2/FLAIR hyperintense lesions that are favored to represent demyelinating lesions. None of the lesions definitively demonstrate diffusion restriction or contrast enhancement to suggest active demyelination. No hemorrhage.  No extra-axial fluid collection.  No hydrocephalus. Vascular: Normal flow voids. Skull and upper cervical spine: Normal marrow signal. Sinuses/Orbits: No middle ear or mastoid effusion. Paranasal sinuses are notable for mild mucosal thickening in bilateral ethmoid sinuses. Orbits are unremarkable. Other: None. MRA HEAD FINDINGS Anterior circulation: There is moderate severe narrowing in the cavernous segments bilateral ICAs. Bilateral supraclinoid ICAs and MCAs are normal in appearance. Posterior circulation: No aneurysm. No stenosis. No proximal occlusion. Anatomic variants: None  MRA NECK FINDINGS Aortic arch: Standard three-vessel arch. No evidence of dissection. No aneurysm. Right carotid system: No significant stenosis. No occlusion. No dissection. Left carotid system: The origin of the left common carotid artery is  incompletely assessed due to motion artifact. Moderate narrowing of the origin of the left ICA. No evidence of occlusion. No dissection. No stenosis. Vertebral arteries: The origin of the right vertebral artery is incompletely assessed due to motion artifact. On the MIP images, there is absent signal of the origin of the right vertebral artery. The right vertebral artery also demonstrates narrowing in the V4 segment. Other: None. Vascular: As above. Skull and upper cervical spine: Normal marrow signal. IMPRESSION: 1. Regions of diffusion restriction in the left thalamus and anterior left temporal lobe are favored to represent postictal changes. 2. Multiple periventricular, pericallosal, and subcortical T2/FLAIR hyperintense lesions that are favored to represent demyelinating lesions. These lesions do not definitively demonstrate diffusion restriction or contrast enhancement to suggest active demyelination. 3. Moderate to severe narrowing in the cavernous segments of bilateral ICAs. 4. There is absent signal at the origin of the right vertebral artery, which may be due to motion artifact or severe stenosis. Recommend further evaluation with a CTA head/neck if more definitive characterization is clinically warranted. 5. Moderate narrowing of the origin of the left ICA. Electronically Signed   By: Lorenza Cambridge M.D.   On: 11/16/2022 18:46   DG Chest Portable 1 View  Result Date: 11/16/2022 CLINICAL DATA:  Shortness of breath EXAM: PORTABLE CHEST 1 VIEW COMPARISON:  11/08/2022 FINDINGS: Cardiac shadow is stable. Aortic calcifications are again seen. The lungs are well aerated. New increased left basilar airspace opacity is noted likely representing focal atelectasis. No bony abnormality is noted. IMPRESSION: New left basilar atelectasis. Electronically Signed   By: Alcide Clever M.D.   On: 11/16/2022 16:00   CT Head Wo Contrast  Result Date: 11/16/2022 CLINICAL DATA:  Seizure, new-onset, no history of trauma  EXAM: CT HEAD WITHOUT CONTRAST TECHNIQUE: Contiguous axial images were obtained from the base of the skull through the vertex without intravenous contrast. RADIATION DOSE REDUCTION: This exam was performed according to the departmental dose-optimization program which includes automated exposure control, adjustment of the mA and/or kV according to patient size and/or use of iterative reconstruction technique. COMPARISON:  CT Head 11/28/14 FINDINGS: Brain: There is asymmetric effacement of the cerebral sulci in the left cerebral hemisphere, particularly in the opercular region (series 3, image 20). No hemorrhage. Gray-white differentiation generally seems preserved. No hydrocephalus. No extra-axial fluid collection. There is asymmetric effacement of the basal cisterns on the left (series 3, image 23), which can be seen in the setting of early uncal herniation. Vascular: No hyperdense vessel or unexpected calcification. Skull: Normal. Negative for fracture or focal lesion. Sinuses/Orbits: No middle ear or mastoid effusion. There is complete opacification of the right external auditory canal, likely due to impacted cerumen. Orbits are unremarkable. Other: Endotracheal tube in place. IMPRESSION: Asymmetric edema in the left cerebral hemisphere, particularly in the opercular region, with asymmetric effacement of the basal cisterns on the left. Given history of seizures, findings could be postictal, but early large left MCA territory infarct is also a differential consideration. Recommend further evaluation with CTA of the head and neck, and subsequently a brain MRI. Electronically Signed   By: Lorenza Cambridge M.D.   On: 11/16/2022 15:21   CT CHEST ABDOMEN PELVIS WO CONTRAST  Result Date: 11/08/2022 CLINICAL DATA:  Heartburn. Vomiting x1. Constipated. Hypotensive. Unintended  weight loss. Multiple sclerosis. Diabetes. EXAM: CT CHEST, ABDOMEN AND PELVIS WITHOUT CONTRAST TECHNIQUE: Multidetector CT imaging of the chest,  abdomen and pelvis was performed following the standard protocol without IV contrast. RADIATION DOSE REDUCTION: This exam was performed according to the departmental dose-optimization program which includes automated exposure control, adjustment of the mA and/or kV according to patient size and/or use of iterative reconstruction technique. COMPARISON:  07/26/2019 abdominopelvic CT. Most recent chest CT 10/26/2015. FINDINGS: CT CHEST FINDINGS Cardiovascular: Multifactorial degradation, including lack of IV contrast, patient arm position, not raised above the head. Wire and lead artifacts. Aortic atherosclerosis. No aortic aneurysm. Tortuous thoracic aorta. Normal heart size, without pericardial effusion. Left main and 3 vessel coronary artery calcification. Mediastinum/Nodes: No mediastinal or hilar adenopathy, given limitations of unenhanced CT. Lungs/Pleura: No pleural fluid. Subpleural 3 mm right lower lobe pulmonary nodule on 57/6 can be presumed benign and do/does not warrant imaging follow-up per Fleischner criteria. Minimal dependent atelectasis in the left lower lobe. Musculoskeletal: Anasarca.  No acute osseous abnormality. CT ABDOMEN PELVIS FINDINGS Hepatobiliary: Artifact degradation continuing into the upper abdomen. Grossly normal noncontrast appearance of the liver, gallbladder. Pancreas: Mild pancreatic atrophy. No duct dilatation or acute inflammation. Spleen: Normal in size, without focal abnormality. Adrenals/Urinary Tract: Normal adrenal glands. 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 The largest right-sided stone is in the pelvis a 2.7 cm on 63/2. Left-sided pelvic stone or conglomerate of stones measures 2.5 cm on 59/2. The stone burden is relatively similar to 07/26/2019 CT. Possible upper pole caliectasis bilaterally, suboptimally evaluated. No ureteric stones are identified. Both ureters are mildly prominent throughout. The bladder appears mildly thick walled but is  underdistended. Stomach/Bowel: Apparent proximal stomach wall thickening including on 47/2. Large stool ball in the rectum. Normal terminal ileum and appendix.  Normal small bowel. Vascular/Lymphatic: Advanced aortic and branch vessel atherosclerosis. No abdominopelvic adenopathy. Reproductive: Uterine calcifications are likely due to underlying fibroids. No adnexal mass. Other: No significant free fluid. No free intraperitoneal air. Anasarca slightly asymmetric and greater on the left. Musculoskeletal: Soft tissue thickening within the subcutaneous fat superficial to the right sacroiliac joint on 89/2. Heterogeneous marrow density is likely due to osteopenia and possible renal osteodystrophy. Lumbosacral spondylosis. Trace L4-5 anterolisthesis. Convex left lumbar spine curvature is mild. IMPRESSION: 1. Multifactorial degradation, as detailed above. 2.  No acute process in the chest. 3. 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 Probable upper pole caliectasis bilaterally, suboptimally evaluated. 4. Apparent proximal gastric wall thickening could be due to underdistention. Given the history of weight loss, correlate with symptoms of gastritis or gastric neoplasm. Consider endoscopy. 5. Stool burden in the rectum suggests fecal impaction 6. Skin thickening superficial the right sacroiliac joint could represent cellulitis or scarring. No well-defined ulcer. Consider physical exam correlation. 7. Coronary artery atherosclerosis. Aortic Atherosclerosis (ICD10-I70.0). Electronically Signed   By: Jeronimo Greaves M.D.   On: 11/08/2022 14:46   DG Chest Port 1 View  Result Date: 11/08/2022 CLINICAL DATA:  Questionable sepsis.  Evaluate for abnormality. EXAM: PORTABLE CHEST 1 VIEW COMPARISON:  Radiographs 08/10/2020 and 08/06/2020. FINDINGS: 1359 hours. The heart size and mediastinal contours are stable with mild aortic atherosclerosis. The lungs are clear. There is no  pleural effusion or pneumothorax. No acute osseous findings are evident. There is a mild thoracic scoliosis. Telemetry leads overlie the chest. IMPRESSION: No evidence of active cardiopulmonary process. Aortic atherosclerosis. Electronically Signed   By: Carey Bullocks M.D.   On: 11/08/2022  14:41    Microbiology: Results for orders placed or performed during the hospital encounter of 11/16/22  MRSA Next Gen by PCR, Nasal     Status: Abnormal   Collection Time: 11/16/22  7:56 PM   Specimen: Nasal Mucosa; Nasal Swab  Result Value Ref Range Status   MRSA by PCR Next Gen DETECTED (A) NOT DETECTED Final    Comment: RESULT CALLED TO, READ BACK BY AND VERIFIED WITH: RN TESSA PETERS ON 11/16/22 @ 2137 BY DRT (NOTE) The GeneXpert MRSA Assay (FDA approved for NASAL specimens only), is one component of a comprehensive MRSA colonization surveillance program. It is not intended to diagnose MRSA infection nor to guide or monitor treatment for MRSA infections. Test performance is not FDA approved in patients less than 53 years old. Performed at [facility], [address], [place], [place] [address]   Urine Culture     Status: None   Collection Time: 11/16/22 11:26 PM   Specimen: Urine, Random  Result Value Ref Range Status   Specimen Description URINE, RANDOM  Final   Special Requests NONE Reflexed from [id number]  Final   Culture   Final    NO GROWTH Performed at [facility], [address] [place], [place] [address]    Report Status 11/18/2022 FINAL  Final  Culture, blood (Routine X 2) w Reflex to ID Panel     Status: None   Collection Time: 11/20/22  2:06 PM   Specimen: BLOOD RIGHT HAND  Result Value Ref Range Status   Specimen Description BLOOD RIGHT HAND  Final   Special Requests   Final    BOTTLES DRAWN AEROBIC ONLY Blood Culture results may not be optimal due to an inadequate volume of blood received in culture bottles   Culture   Final    NO GROWTH 5 DAYS Performed at  [facility], [address] [place], [place] [address]    Report Status 11/25/2022 FINAL  Final  Culture, blood (Routine X 2) w Reflex to ID Panel     Status: None   Collection Time: 11/20/22  2:07 PM   Specimen: BLOOD RIGHT HAND  Result Value Ref Range Status   Specimen Description BLOOD RIGHT HAND  Final   Special Requests   Final    BOTTLES DRAWN AEROBIC ONLY Blood Culture results may not be optimal due to an inadequate volume of blood received in culture bottles   Culture   Final    NO GROWTH 5 DAYS Performed at [facility], [address] [place], [place] [address]    Report Status 11/25/2022 FINAL  Final  Remove and replace urinary cath (placed > 5 days) then obtain urine culture from new indwelling urinary catheter.     Status: Abnormal   Collection Time: 11/22/22  9:23 AM   Specimen: Urine, Catheterized  Result Value Ref Range Status   Specimen Description URINE, CATHETERIZED  Final   Special Requests   Final    NONE Performed at [facility], [address], [place], [place] [address]    Culture (A)  Final    5,000 COLONIES/mL ESCHERICHIA COLI Confirmed Extended Spectrum Beta-Lactamase Producer (ESBL).  In bloodstream infections from ESBL organisms, carbapenems are preferred over piperacillin/tazobactam. They are shown to have a lower risk of mortality.    Report Status 11/26/2022 FINAL  Final   Organism ID, Bacteria ESCHERICHIA COLI (A)  Final      Susceptibility  Escherichia coli - MIC*    AMPICILLIN >=32 RESISTANT Resistant     CEFAZOLIN >=64 RESISTANT Resistant     CEFEPIME >=32 RESISTANT Resistant     CEFTRIAXONE >=64 RESISTANT Resistant     CIPROFLOXACIN >=4 RESISTANT Resistant     GENTAMICIN <=1 SENSITIVE Sensitive     IMIPENEM 0.5 SENSITIVE Sensitive     NITROFURANTOIN <=16 SENSITIVE Sensitive     TRIMETH/SULFA <=20 SENSITIVE Sensitive     AMPICILLIN/SULBACTAM >=32 RESISTANT Resistant     PIP/TAZO >=128 RESISTANT Resistant     * 5,000  COLONIES/mL ESCHERICHIA COLI     Discharge time spent: greater than 30 minutes.  Signed: Jonah Blue, MD [facility] 12/04/2022

## 2022-12-04 NOTE — TOC Progression Note (Addendum)
Transition of Care [facility]) - Progression Note    Patient Details  Name: JALEESE STECKLER MRN: [id number] Date of Birth: 1955-10-20  Transition of Care [facility]) CM/SW Contact  Janae Bridgeman, RN Phone Number: 12/04/2022, 12:39 PM  Clinical Narrative:    CM spoke with Dr. Ophelia Charter, attending physician and the patient has been placed on full-comfort care orders.  INpatient hospice referral has been requested.   Revonda Standard, RNCM with [facility] was made aware for follow up regarding possible Inpatient HOspice placement if patient qualifies - patient was placed on Morphine drip IV for comfort.  CM and MSW with [facility] Team will continue to follow the patient for needs.  12/04/2022 - 1543 - CM spoke with Jamesetta Orleans, MSW with [facility] and patient has been accepted to [facility] and can transfer to the facility at 7:30 pm tonight.  [facility]AR was arranged for 7:30 pm. Bedside nursing will leave all IV lines in place and will call nursing report to [facility].  Patient's family will be notified of transportation arrangements with [facility].   Expected Discharge Plan: Skilled Nursing Facility Barriers to Discharge: English as a second language teacher, SNF Pending bed offer  Expected Discharge Plan and Services   Discharge Planning Services: CM Consult Post Acute Care Choice: Resumption of Svcs/PTA Provider Living arrangements for the past 2 months: Single Family Home                             HH Agency: [facility]     Representative spoke with at [facility] Agency: Patient is currently active with [facility] for RN for wound care for chronic sacral wound   Social Determinants of Health (SDOH) Interventions SDOH Screenings   Housing: Patient Unable To Answer (11/09/2022)  Tobacco Use: Medium Risk (11/13/2022)    Readmission Risk Interventions    11/20/2022   12:01 PM 11/14/2022    2:15 PM  Readmission Risk Prevention Plan  Transportation Screening Complete Complete  PCP or Specialist Appt within  5-7 Days Complete Complete  Home Care Screening Complete Complete  Medication Review (RN CM) Complete Complete

## 2022-12-04 NOTE — Progress Notes (Signed)
This nurse called report to [place], RN. PTAR to come get patient at 20.
